# Patient Record
Sex: Male | Born: 1940 | Race: White | Hispanic: No | Marital: Married | Smoking: Never smoker
Health system: Southern US, Community
[De-identification: ages and names within clinical notes are randomized; demographics above are authoritative.]

## PROBLEM LIST (undated history)

## (undated) DIAGNOSIS — K5730 Diverticulosis of large intestine without perforation or abscess without bleeding: Secondary | ICD-10-CM

## (undated) DIAGNOSIS — I2699 Other pulmonary embolism without acute cor pulmonale: Secondary | ICD-10-CM

## (undated) DIAGNOSIS — R51 Headache: Secondary | ICD-10-CM

## (undated) DIAGNOSIS — M797 Fibromyalgia: Secondary | ICD-10-CM

## (undated) DIAGNOSIS — R519 Headache, unspecified: Secondary | ICD-10-CM

## (undated) DIAGNOSIS — I739 Peripheral vascular disease, unspecified: Secondary | ICD-10-CM

## (undated) DIAGNOSIS — Z860100 Personal history of colon polyps, unspecified: Secondary | ICD-10-CM

## (undated) DIAGNOSIS — K529 Noninfective gastroenteritis and colitis, unspecified: Secondary | ICD-10-CM

## (undated) DIAGNOSIS — E669 Obesity, unspecified: Secondary | ICD-10-CM

## (undated) DIAGNOSIS — J84112 Idiopathic pulmonary fibrosis: Secondary | ICD-10-CM

## (undated) DIAGNOSIS — J45909 Unspecified asthma, uncomplicated: Secondary | ICD-10-CM

## (undated) DIAGNOSIS — Z8639 Personal history of other endocrine, nutritional and metabolic disease: Secondary | ICD-10-CM

## (undated) DIAGNOSIS — I10 Essential (primary) hypertension: Secondary | ICD-10-CM

## (undated) DIAGNOSIS — C4490 Unspecified malignant neoplasm of skin, unspecified: Secondary | ICD-10-CM

## (undated) DIAGNOSIS — G2581 Restless legs syndrome: Secondary | ICD-10-CM

## (undated) DIAGNOSIS — C61 Malignant neoplasm of prostate: Secondary | ICD-10-CM

## (undated) DIAGNOSIS — Z87442 Personal history of urinary calculi: Secondary | ICD-10-CM

## (undated) DIAGNOSIS — I482 Chronic atrial fibrillation, unspecified: Secondary | ICD-10-CM

## (undated) DIAGNOSIS — H9190 Unspecified hearing loss, unspecified ear: Secondary | ICD-10-CM

## (undated) DIAGNOSIS — M1990 Unspecified osteoarthritis, unspecified site: Secondary | ICD-10-CM

## (undated) DIAGNOSIS — Z8739 Personal history of other diseases of the musculoskeletal system and connective tissue: Secondary | ICD-10-CM

## (undated) DIAGNOSIS — K219 Gastro-esophageal reflux disease without esophagitis: Secondary | ICD-10-CM

## (undated) DIAGNOSIS — Z86718 Personal history of other venous thrombosis and embolism: Secondary | ICD-10-CM

## (undated) DIAGNOSIS — N529 Male erectile dysfunction, unspecified: Secondary | ICD-10-CM

## (undated) DIAGNOSIS — J189 Pneumonia, unspecified organism: Secondary | ICD-10-CM

## (undated) HISTORY — PX: OTHER SURGICAL HISTORY: SHX169

## (undated) HISTORY — DX: Essential (primary) hypertension: I10

## (undated) HISTORY — DX: Other pulmonary embolism without acute cor pulmonale: I26.99

## (undated) HISTORY — DX: Restless legs syndrome: G25.81

## (undated) HISTORY — PX: FOOT SURGERY: SHX648

## (undated) HISTORY — PX: REPLACEMENT TOTAL KNEE: SUR1224

## (undated) HISTORY — PX: HAND SURGERY: SHX662

## (undated) HISTORY — PX: EYE SURGERY: SHX253

## (undated) HISTORY — DX: Unspecified asthma, uncomplicated: J45.909

## (undated) HISTORY — PX: CYSTOSCOPY: SUR368

## (undated) HISTORY — PX: KNEE SURGERY: SHX244

## (undated) HISTORY — PX: BACK SURGERY: SHX140

## (undated) HISTORY — PX: JOINT REPLACEMENT: SHX530

## (undated) HISTORY — PX: TONSILLECTOMY: SUR1361

## (undated) HISTORY — PX: COLONOSCOPY: SHX174

## (undated) HISTORY — PX: LEG SURGERY: SHX1003

## (undated) HISTORY — DX: Chronic atrial fibrillation, unspecified: I48.20

## (undated) HISTORY — PX: SHOULDER ARTHROSCOPY: SHX128

## (undated) HISTORY — PX: CARDIOVERSION: SHX1299

## (undated) HISTORY — PX: CATARACT EXTRACTION, BILATERAL: SHX1313

## (undated) HISTORY — PX: PICC INSERTION W/OUT PORT/PUMP: CATH118307

## (undated) HISTORY — PX: ARTERY REPAIR: SHX559

## (undated) HISTORY — DX: Diverticulosis of large intestine without perforation or abscess without bleeding: K57.30

---

## 1992-02-10 DIAGNOSIS — E119 Type 2 diabetes mellitus without complications: Secondary | ICD-10-CM | POA: Insufficient documentation

## 1997-12-10 ENCOUNTER — Other Ambulatory Visit: Admission: RE | Admit: 1997-12-10 | Discharge: 1997-12-10 | Payer: Self-pay | Admitting: Gastroenterology

## 1998-01-01 ENCOUNTER — Encounter: Payer: Self-pay | Admitting: Cardiology

## 1998-01-01 ENCOUNTER — Inpatient Hospital Stay (HOSPITAL_COMMUNITY): Admission: AD | Admit: 1998-01-01 | Discharge: 1998-01-01 | Payer: Self-pay | Admitting: Cardiology

## 1998-01-09 ENCOUNTER — Encounter: Payer: Self-pay | Admitting: Emergency Medicine

## 1998-01-09 ENCOUNTER — Emergency Department (HOSPITAL_COMMUNITY): Admission: EM | Admit: 1998-01-09 | Discharge: 1998-01-09 | Payer: Self-pay | Admitting: Emergency Medicine

## 1998-01-28 ENCOUNTER — Observation Stay (HOSPITAL_COMMUNITY): Admission: RE | Admit: 1998-01-28 | Discharge: 1998-01-29 | Payer: Self-pay | Admitting: Orthopedic Surgery

## 1998-01-28 ENCOUNTER — Encounter: Payer: Self-pay | Admitting: Orthopedic Surgery

## 1998-03-03 ENCOUNTER — Encounter: Payer: Self-pay | Admitting: *Deleted

## 1998-03-04 ENCOUNTER — Observation Stay (HOSPITAL_COMMUNITY): Admission: EM | Admit: 1998-03-04 | Discharge: 1998-03-04 | Payer: Self-pay | Admitting: *Deleted

## 1998-03-04 ENCOUNTER — Encounter: Payer: Self-pay | Admitting: *Deleted

## 1999-04-09 ENCOUNTER — Encounter: Payer: Self-pay | Admitting: Emergency Medicine

## 1999-04-10 ENCOUNTER — Encounter: Payer: Self-pay | Admitting: Orthopedic Surgery

## 1999-04-10 ENCOUNTER — Observation Stay (HOSPITAL_COMMUNITY): Admission: EM | Admit: 1999-04-10 | Discharge: 1999-04-10 | Payer: Self-pay | Admitting: Emergency Medicine

## 1999-04-29 ENCOUNTER — Encounter: Admission: RE | Admit: 1999-04-29 | Discharge: 1999-04-29 | Payer: Self-pay | Admitting: Orthopedic Surgery

## 1999-04-29 ENCOUNTER — Encounter: Payer: Self-pay | Admitting: Orthopedic Surgery

## 1999-10-16 ENCOUNTER — Ambulatory Visit: Admission: RE | Admit: 1999-10-16 | Discharge: 1999-10-16 | Payer: Self-pay | Admitting: Family Medicine

## 2000-06-02 ENCOUNTER — Encounter: Payer: Self-pay | Admitting: Orthopedic Surgery

## 2000-06-09 ENCOUNTER — Encounter: Payer: Self-pay | Admitting: Orthopedic Surgery

## 2000-06-09 ENCOUNTER — Inpatient Hospital Stay (HOSPITAL_COMMUNITY): Admission: RE | Admit: 2000-06-09 | Discharge: 2000-06-13 | Payer: Self-pay | Admitting: Orthopedic Surgery

## 2001-12-27 ENCOUNTER — Ambulatory Visit (HOSPITAL_COMMUNITY): Admission: RE | Admit: 2001-12-27 | Discharge: 2001-12-27 | Payer: Self-pay | Admitting: Gastroenterology

## 2001-12-27 ENCOUNTER — Encounter: Payer: Self-pay | Admitting: Gastroenterology

## 2004-08-19 ENCOUNTER — Ambulatory Visit: Payer: Self-pay | Admitting: Cardiology

## 2004-08-25 ENCOUNTER — Encounter: Admission: RE | Admit: 2004-08-25 | Discharge: 2004-08-25 | Payer: Self-pay | Admitting: Family Medicine

## 2004-09-23 ENCOUNTER — Encounter: Admission: RE | Admit: 2004-09-23 | Discharge: 2004-09-23 | Payer: Self-pay | Admitting: Family Medicine

## 2004-10-03 ENCOUNTER — Encounter: Admission: RE | Admit: 2004-10-03 | Discharge: 2004-10-03 | Payer: Self-pay | Admitting: Family Medicine

## 2004-10-08 ENCOUNTER — Ambulatory Visit: Payer: Self-pay | Admitting: Critical Care Medicine

## 2004-10-30 ENCOUNTER — Ambulatory Visit: Payer: Self-pay | Admitting: Critical Care Medicine

## 2004-11-19 ENCOUNTER — Ambulatory Visit: Payer: Self-pay | Admitting: Critical Care Medicine

## 2004-12-25 ENCOUNTER — Encounter: Admission: RE | Admit: 2004-12-25 | Discharge: 2004-12-25 | Payer: Self-pay | Admitting: Neurosurgery

## 2005-01-16 ENCOUNTER — Ambulatory Visit: Payer: Self-pay | Admitting: Cardiology

## 2005-01-21 ENCOUNTER — Inpatient Hospital Stay (HOSPITAL_COMMUNITY): Admission: RE | Admit: 2005-01-21 | Discharge: 2005-01-24 | Payer: Self-pay | Admitting: Neurosurgery

## 2005-02-09 LAB — HM COLONOSCOPY

## 2005-02-19 ENCOUNTER — Ambulatory Visit: Payer: Self-pay | Admitting: Critical Care Medicine

## 2005-04-30 ENCOUNTER — Ambulatory Visit: Payer: Self-pay | Admitting: Cardiology

## 2005-05-28 ENCOUNTER — Ambulatory Visit: Payer: Self-pay | Admitting: Internal Medicine

## 2005-06-25 ENCOUNTER — Ambulatory Visit: Payer: Self-pay | Admitting: Cardiology

## 2005-07-17 ENCOUNTER — Ambulatory Visit: Payer: Self-pay | Admitting: Cardiology

## 2005-08-21 ENCOUNTER — Ambulatory Visit: Payer: Self-pay | Admitting: Cardiology

## 2005-08-31 ENCOUNTER — Ambulatory Visit: Payer: Self-pay | Admitting: Internal Medicine

## 2005-09-14 ENCOUNTER — Ambulatory Visit: Payer: Self-pay | Admitting: Cardiology

## 2005-09-16 ENCOUNTER — Emergency Department (HOSPITAL_COMMUNITY): Admission: EM | Admit: 2005-09-16 | Discharge: 2005-09-16 | Payer: Self-pay | Admitting: Emergency Medicine

## 2005-09-23 ENCOUNTER — Encounter: Admission: RE | Admit: 2005-09-23 | Discharge: 2005-09-23 | Payer: Self-pay | Admitting: Neurosurgery

## 2005-09-23 ENCOUNTER — Ambulatory Visit: Payer: Self-pay | Admitting: Cardiology

## 2005-09-30 ENCOUNTER — Ambulatory Visit: Payer: Self-pay | Admitting: Cardiology

## 2005-10-27 ENCOUNTER — Ambulatory Visit: Payer: Self-pay | Admitting: Gastroenterology

## 2005-10-28 ENCOUNTER — Ambulatory Visit: Payer: Self-pay | Admitting: Cardiology

## 2005-10-30 ENCOUNTER — Ambulatory Visit: Payer: Self-pay | Admitting: Cardiovascular Disease

## 2005-11-04 ENCOUNTER — Ambulatory Visit: Payer: Self-pay | Admitting: Gastroenterology

## 2005-11-11 ENCOUNTER — Ambulatory Visit: Payer: Self-pay | Admitting: Gastroenterology

## 2005-11-16 ENCOUNTER — Ambulatory Visit: Payer: Self-pay | Admitting: Internal Medicine

## 2005-11-18 ENCOUNTER — Ambulatory Visit: Payer: Self-pay | Admitting: Gastroenterology

## 2005-11-25 ENCOUNTER — Ambulatory Visit: Payer: Self-pay | Admitting: Cardiology

## 2005-11-25 ENCOUNTER — Ambulatory Visit: Payer: Self-pay | Admitting: Gastroenterology

## 2005-12-02 ENCOUNTER — Ambulatory Visit: Payer: Self-pay | Admitting: Gastroenterology

## 2005-12-11 ENCOUNTER — Ambulatory Visit: Payer: Self-pay | Admitting: Internal Medicine

## 2006-01-01 ENCOUNTER — Ambulatory Visit: Payer: Self-pay | Admitting: Gastroenterology

## 2006-01-15 ENCOUNTER — Ambulatory Visit: Payer: Self-pay | Admitting: Cardiology

## 2006-01-28 ENCOUNTER — Ambulatory Visit: Payer: Self-pay | Admitting: Cardiology

## 2006-03-09 ENCOUNTER — Ambulatory Visit: Payer: Self-pay | Admitting: Cardiology

## 2006-03-17 ENCOUNTER — Ambulatory Visit: Payer: Self-pay | Admitting: Family Medicine

## 2006-04-09 ENCOUNTER — Ambulatory Visit: Payer: Self-pay | Admitting: Internal Medicine

## 2006-04-22 ENCOUNTER — Encounter
Admission: RE | Admit: 2006-04-22 | Discharge: 2006-07-21 | Payer: Self-pay | Admitting: Physical Medicine & Rehabilitation

## 2006-04-23 ENCOUNTER — Ambulatory Visit: Payer: Self-pay | Admitting: Physical Medicine & Rehabilitation

## 2006-05-11 ENCOUNTER — Ambulatory Visit: Payer: Self-pay | Admitting: Internal Medicine

## 2006-05-19 ENCOUNTER — Encounter: Payer: Self-pay | Admitting: Family Medicine

## 2006-05-19 DIAGNOSIS — K589 Irritable bowel syndrome without diarrhea: Secondary | ICD-10-CM

## 2006-05-19 DIAGNOSIS — I10 Essential (primary) hypertension: Secondary | ICD-10-CM | POA: Insufficient documentation

## 2006-05-19 DIAGNOSIS — K5730 Diverticulosis of large intestine without perforation or abscess without bleeding: Secondary | ICD-10-CM | POA: Insufficient documentation

## 2006-05-19 DIAGNOSIS — M545 Low back pain, unspecified: Secondary | ICD-10-CM | POA: Insufficient documentation

## 2006-05-19 DIAGNOSIS — F411 Generalized anxiety disorder: Secondary | ICD-10-CM | POA: Insufficient documentation

## 2006-05-19 DIAGNOSIS — E7800 Pure hypercholesterolemia, unspecified: Secondary | ICD-10-CM | POA: Insufficient documentation

## 2006-05-19 DIAGNOSIS — F329 Major depressive disorder, single episode, unspecified: Secondary | ICD-10-CM | POA: Insufficient documentation

## 2006-05-19 DIAGNOSIS — K3184 Gastroparesis: Secondary | ICD-10-CM

## 2006-05-19 DIAGNOSIS — F330 Major depressive disorder, recurrent, mild: Secondary | ICD-10-CM | POA: Insufficient documentation

## 2006-05-19 DIAGNOSIS — G2581 Restless legs syndrome: Secondary | ICD-10-CM

## 2006-06-29 ENCOUNTER — Ambulatory Visit: Payer: Self-pay | Admitting: Family Medicine

## 2006-07-01 ENCOUNTER — Ambulatory Visit: Payer: Self-pay | Admitting: Family Medicine

## 2006-07-01 DIAGNOSIS — G933 Postviral fatigue syndrome: Secondary | ICD-10-CM | POA: Insufficient documentation

## 2006-07-01 DIAGNOSIS — B0229 Other postherpetic nervous system involvement: Secondary | ICD-10-CM

## 2006-07-01 LAB — CONVERTED CEMR LAB
HDL goal, serum: 40 mg/dL
INR: 2.2

## 2006-07-29 ENCOUNTER — Ambulatory Visit: Payer: Self-pay | Admitting: Family Medicine

## 2006-07-29 LAB — CONVERTED CEMR LAB
INR: 1.9
Prothrombin Time: 17.2 s

## 2006-08-26 ENCOUNTER — Ambulatory Visit: Payer: Self-pay | Admitting: Cardiology

## 2006-08-26 ENCOUNTER — Ambulatory Visit: Payer: Self-pay | Admitting: Family Medicine

## 2006-08-26 LAB — CONVERTED CEMR LAB: Prothrombin Time: 18 s

## 2006-09-10 ENCOUNTER — Ambulatory Visit: Payer: Self-pay | Admitting: Family Medicine

## 2006-09-20 ENCOUNTER — Telehealth: Payer: Self-pay | Admitting: Family Medicine

## 2006-09-23 ENCOUNTER — Ambulatory Visit: Payer: Self-pay | Admitting: Family Medicine

## 2006-09-23 LAB — CONVERTED CEMR LAB: Prothrombin Time: 19 s

## 2006-09-24 LAB — CONVERTED CEMR LAB
AST: 12 units/L (ref 0–37)
Albumin: 3.7 g/dL (ref 3.5–5.2)
Bilirubin, Direct: 0.1 mg/dL (ref 0.0–0.3)
Creatinine, Ser: 1.2 mg/dL (ref 0.4–1.5)
HDL: 44.5 mg/dL (ref >39.0)
Potassium: 3.4 meq/L — ABNORMAL LOW (ref 3.5–5.1)
Sodium: 142 meq/L (ref 135–145)
Triglycerides: 233 mg/dL (ref 0–149)
VLDL: 47 mg/dL — ABNORMAL HIGH (ref 0–40)

## 2006-09-28 ENCOUNTER — Telehealth: Payer: Self-pay | Admitting: Family Medicine

## 2006-10-04 ENCOUNTER — Ambulatory Visit: Payer: Self-pay | Admitting: Family Medicine

## 2006-10-04 LAB — CONVERTED CEMR LAB
BUN: 28 mg/dL — ABNORMAL HIGH (ref 6–23)
CO2: 26 meq/L (ref 19–32)
Calcium: 9.5 mg/dL (ref 8.4–10.5)
Chloride: 105 meq/L (ref 96–112)
GFR calc Af Amer: 66 mL/min
Glucose, Bld: 182 mg/dL — ABNORMAL HIGH (ref 70–99)

## 2006-11-01 ENCOUNTER — Ambulatory Visit: Payer: Self-pay | Admitting: Family Medicine

## 2006-11-01 LAB — CONVERTED CEMR LAB: Prothrombin Time: 15.7 s

## 2006-11-09 ENCOUNTER — Ambulatory Visit: Payer: Self-pay | Admitting: Family Medicine

## 2006-11-15 ENCOUNTER — Ambulatory Visit: Payer: Self-pay | Admitting: Family Medicine

## 2006-11-15 LAB — CONVERTED CEMR LAB: INR: 2.4

## 2006-12-18 ENCOUNTER — Encounter: Payer: Self-pay | Admitting: *Deleted

## 2006-12-18 DIAGNOSIS — Z96659 Presence of unspecified artificial knee joint: Secondary | ICD-10-CM | POA: Insufficient documentation

## 2006-12-22 ENCOUNTER — Telehealth: Payer: Self-pay | Admitting: Family Medicine

## 2006-12-27 ENCOUNTER — Ambulatory Visit: Payer: Self-pay | Admitting: Family Medicine

## 2006-12-27 LAB — CONVERTED CEMR LAB

## 2006-12-29 ENCOUNTER — Telehealth: Payer: Self-pay | Admitting: Family Medicine

## 2007-01-05 ENCOUNTER — Encounter (INDEPENDENT_AMBULATORY_CARE_PROVIDER_SITE_OTHER): Payer: Self-pay | Admitting: *Deleted

## 2007-01-10 ENCOUNTER — Ambulatory Visit: Payer: Self-pay | Admitting: Family Medicine

## 2007-01-10 ENCOUNTER — Encounter: Payer: Self-pay | Admitting: Internal Medicine

## 2007-01-17 LAB — CONVERTED CEMR LAB
BUN: 20 mg/dL (ref 6–23)
Chloride: 107 meq/L (ref 96–112)
Creatinine, Ser: 1.1 mg/dL (ref 0.4–1.5)
GFR calc Af Amer: 86 mL/min
GFR calc non Af Amer: 71 mL/min
Hgb A1c MFr Bld: 7.4 % — ABNORMAL HIGH (ref 4.6–6.0)
Potassium: 4.7 meq/L (ref 3.5–5.1)

## 2007-01-20 ENCOUNTER — Telehealth: Payer: Self-pay | Admitting: Family Medicine

## 2007-01-21 ENCOUNTER — Ambulatory Visit: Payer: Self-pay | Admitting: Family Medicine

## 2007-01-28 ENCOUNTER — Ambulatory Visit: Payer: Self-pay | Admitting: Gastroenterology

## 2007-02-07 ENCOUNTER — Encounter: Payer: Self-pay | Admitting: Family Medicine

## 2007-02-08 ENCOUNTER — Ambulatory Visit: Payer: Self-pay | Admitting: Family Medicine

## 2007-02-21 ENCOUNTER — Telehealth: Payer: Self-pay | Admitting: Family Medicine

## 2007-02-28 ENCOUNTER — Telehealth: Payer: Self-pay | Admitting: Family Medicine

## 2007-03-24 ENCOUNTER — Ambulatory Visit: Payer: Self-pay | Admitting: Family Medicine

## 2007-03-24 LAB — CONVERTED CEMR LAB
INR: 2.4
Prothrombin Time: 18.8 s

## 2007-04-27 ENCOUNTER — Ambulatory Visit: Payer: Self-pay | Admitting: Family Medicine

## 2007-04-27 LAB — CONVERTED CEMR LAB
INR: 2.1
Prothrombin Time: 17.8 s

## 2007-05-17 ENCOUNTER — Telehealth: Payer: Self-pay | Admitting: Family Medicine

## 2007-06-22 ENCOUNTER — Ambulatory Visit: Payer: Self-pay | Admitting: Family Medicine

## 2007-06-22 LAB — CONVERTED CEMR LAB

## 2007-06-27 ENCOUNTER — Ambulatory Visit: Payer: Self-pay | Admitting: Family Medicine

## 2007-06-27 LAB — CONVERTED CEMR LAB
Glucose, Urine, Semiquant: NEGATIVE
Nitrite: NEGATIVE
Specific Gravity, Urine: 1.015
Urobilinogen, UA: 0.2
WBC Urine, dipstick: NEGATIVE
pH: 5

## 2007-07-14 ENCOUNTER — Ambulatory Visit: Payer: Self-pay | Admitting: Family Medicine

## 2007-07-14 LAB — CONVERTED CEMR LAB: Prothrombin Time: 20.3 s

## 2007-07-19 ENCOUNTER — Telehealth: Payer: Self-pay | Admitting: Family Medicine

## 2007-07-25 ENCOUNTER — Encounter (INDEPENDENT_AMBULATORY_CARE_PROVIDER_SITE_OTHER): Payer: Self-pay | Admitting: *Deleted

## 2007-07-28 ENCOUNTER — Ambulatory Visit: Payer: Self-pay | Admitting: Family Medicine

## 2007-08-10 LAB — CONVERTED CEMR LAB
AST: 14 units/L (ref 0–37)
Albumin: 4.1 g/dL (ref 3.5–5.2)
Alkaline Phosphatase: 38 units/L — ABNORMAL LOW (ref 39–117)
BUN: 26 mg/dL — ABNORMAL HIGH (ref 6–23)
CO2: 24 meq/L (ref 19–32)
Chloride: 106 meq/L (ref 96–112)
GFR calc non Af Amer: 65 mL/min
Potassium: 4.5 meq/L (ref 3.5–5.1)
Total Bilirubin: 0.8 mg/dL (ref 0.3–1.2)
VLDL: 45 mg/dL — ABNORMAL HIGH (ref 0–40)

## 2007-08-26 ENCOUNTER — Encounter: Payer: Self-pay | Admitting: Family Medicine

## 2007-09-08 ENCOUNTER — Ambulatory Visit: Payer: Self-pay | Admitting: Cardiology

## 2007-09-09 ENCOUNTER — Encounter: Payer: Self-pay | Admitting: Family Medicine

## 2007-09-13 ENCOUNTER — Ambulatory Visit: Payer: Self-pay | Admitting: Family Medicine

## 2007-09-13 LAB — CONVERTED CEMR LAB: INR: 2.5

## 2007-10-07 ENCOUNTER — Encounter: Payer: Self-pay | Admitting: Family Medicine

## 2007-10-11 ENCOUNTER — Ambulatory Visit: Payer: Self-pay | Admitting: Family Medicine

## 2007-10-11 LAB — CONVERTED CEMR LAB: Prothrombin Time: 17.7 s

## 2007-10-19 ENCOUNTER — Ambulatory Visit: Payer: Self-pay | Admitting: Family Medicine

## 2007-10-20 ENCOUNTER — Ambulatory Visit: Payer: Self-pay | Admitting: Cardiology

## 2007-10-20 ENCOUNTER — Ambulatory Visit: Payer: Self-pay | Admitting: Family Medicine

## 2007-10-21 ENCOUNTER — Encounter: Payer: Self-pay | Admitting: Family Medicine

## 2007-10-24 ENCOUNTER — Ambulatory Visit: Payer: Self-pay | Admitting: Family Medicine

## 2007-10-24 DIAGNOSIS — N1330 Unspecified hydronephrosis: Secondary | ICD-10-CM

## 2007-10-24 LAB — CONVERTED CEMR LAB: Prothrombin Time: 25.6 s

## 2007-10-25 LAB — CONVERTED CEMR LAB
BUN: 29 mg/dL — ABNORMAL HIGH (ref 6–23)
Creatinine, Ser: 1.3 mg/dL (ref 0.4–1.5)
GFR calc Af Amer: 71 mL/min
GFR calc non Af Amer: 59 mL/min

## 2007-11-07 ENCOUNTER — Ambulatory Visit: Payer: Self-pay | Admitting: Family Medicine

## 2007-11-07 ENCOUNTER — Telehealth: Payer: Self-pay | Admitting: Family Medicine

## 2007-11-14 ENCOUNTER — Telehealth: Payer: Self-pay | Admitting: Family Medicine

## 2007-11-14 ENCOUNTER — Encounter: Admission: RE | Admit: 2007-11-14 | Discharge: 2007-11-14 | Payer: Self-pay | Admitting: Family Medicine

## 2007-11-16 ENCOUNTER — Ambulatory Visit: Payer: Self-pay | Admitting: Family Medicine

## 2007-11-21 ENCOUNTER — Ambulatory Visit: Payer: Self-pay | Admitting: Family Medicine

## 2007-11-21 LAB — CONVERTED CEMR LAB
INR: 2.4
Prothrombin Time: 18.8 s

## 2007-11-28 ENCOUNTER — Encounter: Payer: Self-pay | Admitting: Family Medicine

## 2007-12-19 ENCOUNTER — Ambulatory Visit: Payer: Self-pay | Admitting: Family Medicine

## 2007-12-19 LAB — CONVERTED CEMR LAB
INR: 2.1
Prothrombin Time: 17.8 s

## 2007-12-21 ENCOUNTER — Ambulatory Visit: Payer: Self-pay | Admitting: Family Medicine

## 2007-12-23 LAB — CONVERTED CEMR LAB
ALT: 19 units/L (ref 0–53)
AST: 16 units/L (ref 0–37)
Direct LDL: 84.9 mg/dL
HDL: 32.3 mg/dL — ABNORMAL LOW (ref >39.0)

## 2007-12-26 ENCOUNTER — Ambulatory Visit: Payer: Self-pay | Admitting: Family Medicine

## 2008-01-16 ENCOUNTER — Ambulatory Visit: Payer: Self-pay | Admitting: Family Medicine

## 2008-01-16 LAB — CONVERTED CEMR LAB: INR: 2

## 2008-01-24 ENCOUNTER — Encounter: Payer: Self-pay | Admitting: Family Medicine

## 2008-01-30 ENCOUNTER — Ambulatory Visit: Payer: Self-pay | Admitting: Family Medicine

## 2008-01-30 ENCOUNTER — Encounter (INDEPENDENT_AMBULATORY_CARE_PROVIDER_SITE_OTHER): Payer: Self-pay | Admitting: *Deleted

## 2008-01-30 LAB — CONVERTED CEMR LAB: Prothrombin Time: 18 s

## 2008-02-14 ENCOUNTER — Telehealth: Payer: Self-pay | Admitting: Family Medicine

## 2008-02-21 ENCOUNTER — Telehealth: Payer: Self-pay | Admitting: Family Medicine

## 2008-02-22 ENCOUNTER — Ambulatory Visit: Payer: Self-pay | Admitting: Family Medicine

## 2008-02-24 ENCOUNTER — Telehealth: Payer: Self-pay | Admitting: Family Medicine

## 2008-02-27 ENCOUNTER — Telehealth: Payer: Self-pay | Admitting: Family Medicine

## 2008-02-27 ENCOUNTER — Ambulatory Visit: Payer: Self-pay | Admitting: Family Medicine

## 2008-02-27 LAB — CONVERTED CEMR LAB: INR: 2.4

## 2008-03-05 ENCOUNTER — Telehealth: Payer: Self-pay | Admitting: Family Medicine

## 2008-03-14 ENCOUNTER — Telehealth: Payer: Self-pay | Admitting: Family Medicine

## 2008-03-28 ENCOUNTER — Ambulatory Visit: Payer: Self-pay | Admitting: Family Medicine

## 2008-03-28 LAB — CONVERTED CEMR LAB: Prothrombin Time: 19.3 s

## 2008-03-29 LAB — CONVERTED CEMR LAB
ALT: 21 units/L (ref 0–53)
CO2: 24 meq/L (ref 19–32)
Calcium: 8.9 mg/dL (ref 8.4–10.5)
Cholesterol: 168 mg/dL (ref 0–200)
Creatinine, Ser: 1 mg/dL (ref 0.4–1.5)
Direct LDL: 92.5 mg/dL
GFR calc Af Amer: 96 mL/min
Glucose, Bld: 122 mg/dL — ABNORMAL HIGH (ref 70–99)
HDL: 40.1 mg/dL (ref >39.0)
Hgb A1c MFr Bld: 7.4 % — ABNORMAL HIGH (ref 4.6–6.0)
Sodium: 139 meq/L (ref 135–145)
Total CHOL/HDL Ratio: 4.2
Total Protein: 6.7 g/dL (ref 6.0–8.3)
Triglycerides: 234 mg/dL (ref 0–149)

## 2008-04-02 ENCOUNTER — Ambulatory Visit: Payer: Self-pay | Admitting: Family Medicine

## 2008-04-02 ENCOUNTER — Telehealth: Payer: Self-pay | Admitting: Family Medicine

## 2008-05-01 ENCOUNTER — Ambulatory Visit: Payer: Self-pay | Admitting: Family Medicine

## 2008-05-01 LAB — CONVERTED CEMR LAB: Prothrombin Time: 19.3 s

## 2008-05-23 ENCOUNTER — Telehealth: Payer: Self-pay | Admitting: Cardiology

## 2008-05-24 ENCOUNTER — Encounter: Payer: Self-pay | Admitting: Cardiology

## 2008-05-24 ENCOUNTER — Ambulatory Visit: Payer: Self-pay | Admitting: Cardiology

## 2008-05-29 ENCOUNTER — Ambulatory Visit: Payer: Self-pay | Admitting: Family Medicine

## 2008-05-29 LAB — CONVERTED CEMR LAB: INR: 3.1

## 2008-06-01 ENCOUNTER — Encounter: Payer: Self-pay | Admitting: Cardiology

## 2008-06-01 ENCOUNTER — Ambulatory Visit: Payer: Self-pay

## 2008-06-14 ENCOUNTER — Telehealth: Payer: Self-pay | Admitting: Cardiology

## 2008-06-22 ENCOUNTER — Telehealth: Payer: Self-pay | Admitting: Family Medicine

## 2008-06-22 ENCOUNTER — Encounter (INDEPENDENT_AMBULATORY_CARE_PROVIDER_SITE_OTHER): Payer: Self-pay | Admitting: *Deleted

## 2008-07-02 ENCOUNTER — Encounter: Payer: Self-pay | Admitting: Family Medicine

## 2008-07-03 ENCOUNTER — Ambulatory Visit: Payer: Self-pay | Admitting: Family Medicine

## 2008-07-03 LAB — CONVERTED CEMR LAB
AST: 23 units/L (ref 0–37)
Albumin: 4.3 g/dL (ref 3.5–5.2)
Alkaline Phosphatase: 37 units/L — ABNORMAL LOW (ref 39–117)
Bilirubin, Direct: 0.1 mg/dL (ref 0.0–0.3)
Calcium: 9.5 mg/dL (ref 8.4–10.5)
Creatinine,U: 244.6 mg/dL
GFR calc non Af Amer: 64.24 mL/min (ref >60)
Glucose, Bld: 231 mg/dL — ABNORMAL HIGH (ref 70–99)
HDL: 47.5 mg/dL (ref >39.00)
INR: 2.3
Microalb, Ur: 9.9 mg/dL — ABNORMAL HIGH (ref 0.0–1.9)
Potassium: 4.5 meq/L (ref 3.5–5.1)
Prothrombin Time: 18.7 s
Sodium: 140 meq/L (ref 135–145)
Total CHOL/HDL Ratio: 4
Triglycerides: 206 mg/dL — ABNORMAL HIGH (ref 0.0–149.0)

## 2008-07-03 LAB — HM SIGMOIDOSCOPY

## 2008-07-04 ENCOUNTER — Telehealth: Payer: Self-pay | Admitting: Family Medicine

## 2008-07-04 ENCOUNTER — Encounter: Payer: Self-pay | Admitting: Family Medicine

## 2008-08-17 ENCOUNTER — Telehealth: Payer: Self-pay | Admitting: Family Medicine

## 2008-08-20 ENCOUNTER — Ambulatory Visit: Payer: Self-pay | Admitting: Family Medicine

## 2008-08-20 LAB — CONVERTED CEMR LAB
INR: 2.3
Prothrombin Time: 18.4 s

## 2008-08-23 ENCOUNTER — Encounter: Admission: RE | Admit: 2008-08-23 | Discharge: 2008-08-23 | Payer: Self-pay | Admitting: Internal Medicine

## 2008-08-24 ENCOUNTER — Emergency Department (HOSPITAL_COMMUNITY): Admission: EM | Admit: 2008-08-24 | Discharge: 2008-08-24 | Payer: Self-pay | Admitting: Emergency Medicine

## 2008-09-17 ENCOUNTER — Ambulatory Visit: Payer: Self-pay | Admitting: Family Medicine

## 2008-09-17 LAB — CONVERTED CEMR LAB
INR: 2.8
Prothrombin Time: 20.1 s

## 2008-09-22 ENCOUNTER — Emergency Department (HOSPITAL_COMMUNITY): Admission: EM | Admit: 2008-09-22 | Discharge: 2008-09-22 | Payer: Self-pay | Admitting: Emergency Medicine

## 2008-10-01 ENCOUNTER — Ambulatory Visit: Payer: Self-pay | Admitting: Family Medicine

## 2008-10-04 ENCOUNTER — Ambulatory Visit: Payer: Self-pay | Admitting: Family Medicine

## 2008-10-04 DIAGNOSIS — J45901 Unspecified asthma with (acute) exacerbation: Secondary | ICD-10-CM | POA: Insufficient documentation

## 2008-10-22 ENCOUNTER — Ambulatory Visit: Payer: Self-pay | Admitting: Family Medicine

## 2008-10-22 LAB — CONVERTED CEMR LAB
INR: 3.6
Prothrombin Time: 22.9 s

## 2008-10-26 ENCOUNTER — Telehealth: Payer: Self-pay | Admitting: Family Medicine

## 2008-11-13 ENCOUNTER — Ambulatory Visit: Payer: Self-pay | Admitting: Family Medicine

## 2008-11-13 DIAGNOSIS — J4540 Moderate persistent asthma, uncomplicated: Secondary | ICD-10-CM

## 2008-11-19 ENCOUNTER — Ambulatory Visit: Payer: Self-pay | Admitting: Family Medicine

## 2008-12-17 ENCOUNTER — Ambulatory Visit: Payer: Self-pay | Admitting: Family Medicine

## 2008-12-26 ENCOUNTER — Ambulatory Visit: Payer: Self-pay | Admitting: Family Medicine

## 2008-12-26 LAB — CONVERTED CEMR LAB: INR: 2.3

## 2008-12-28 ENCOUNTER — Ambulatory Visit: Payer: Self-pay | Admitting: Family Medicine

## 2009-01-02 ENCOUNTER — Ambulatory Visit: Payer: Self-pay | Admitting: Family Medicine

## 2009-01-09 ENCOUNTER — Encounter (INDEPENDENT_AMBULATORY_CARE_PROVIDER_SITE_OTHER): Payer: Self-pay | Admitting: Internal Medicine

## 2009-01-09 ENCOUNTER — Ambulatory Visit: Payer: Self-pay | Admitting: Family Medicine

## 2009-01-09 LAB — CONVERTED CEMR LAB
Basophils Relative: 0.4 % (ref 0.0–3.0)
Eosinophils Absolute: 0.1 10*3/uL (ref 0.0–0.7)
Hemoglobin: 14.1 g/dL (ref 13.0–17.0)
INR: 3.5
Lymphocytes Relative: 27.7 % (ref 12.0–46.0)
MCHC: 33.7 g/dL (ref 30.0–36.0)
Monocytes Relative: 7.1 % (ref 3.0–12.0)
Neutro Abs: 6.4 10*3/uL (ref 1.4–7.7)
Neutrophils Relative %: 63.7 % (ref 43.0–77.0)
Prothrombin Time: 22.5 s
RBC: 4.54 M/uL (ref 4.22–5.81)
WBC: 10 10*3/uL (ref 4.5–10.5)

## 2009-01-10 ENCOUNTER — Telehealth (INDEPENDENT_AMBULATORY_CARE_PROVIDER_SITE_OTHER): Payer: Self-pay | Admitting: Internal Medicine

## 2009-01-11 ENCOUNTER — Telehealth (INDEPENDENT_AMBULATORY_CARE_PROVIDER_SITE_OTHER): Payer: Self-pay | Admitting: Internal Medicine

## 2009-01-17 ENCOUNTER — Ambulatory Visit: Payer: Self-pay | Admitting: Family Medicine

## 2009-01-17 DIAGNOSIS — J309 Allergic rhinitis, unspecified: Secondary | ICD-10-CM | POA: Insufficient documentation

## 2009-01-28 ENCOUNTER — Encounter: Payer: Self-pay | Admitting: Family Medicine

## 2009-01-29 ENCOUNTER — Telehealth: Payer: Self-pay | Admitting: Family Medicine

## 2009-02-06 ENCOUNTER — Ambulatory Visit: Payer: Self-pay | Admitting: Family Medicine

## 2009-02-06 LAB — CONVERTED CEMR LAB: INR: 2.8

## 2009-02-15 ENCOUNTER — Ambulatory Visit: Payer: Self-pay | Admitting: Family Medicine

## 2009-02-18 LAB — CONVERTED CEMR LAB
AST: 17 units/L (ref 0–37)
Alkaline Phosphatase: 44 units/L (ref 39–117)
Bilirubin, Direct: 0.1 mg/dL (ref 0.0–0.3)
CO2: 24 meq/L (ref 19–32)
Calcium: 9.3 mg/dL (ref 8.4–10.5)
Direct LDL: 107.6 mg/dL
Eosinophils Relative: 1.7 % (ref 0.0–5.0)
GFR calc non Af Amer: 58.47 mL/min (ref >60)
HDL: 38.1 mg/dL — ABNORMAL LOW (ref >39.00)
Hgb A1c MFr Bld: 9.3 % — ABNORMAL HIGH (ref 4.6–6.5)
Monocytes Relative: 11 % (ref 3.0–12.0)
Neutrophils Relative %: 59 % (ref 43.0–77.0)
Platelets: 215 10*3/uL (ref 150.0–400.0)
Potassium: 4.5 meq/L (ref 3.5–5.1)
RBC: 4.37 M/uL (ref 4.22–5.81)
Sodium: 141 meq/L (ref 135–145)
Total Protein: 7.6 g/dL (ref 6.0–8.3)
VLDL: 59 mg/dL — ABNORMAL HIGH (ref 0.0–40.0)
WBC: 5.1 10*3/uL (ref 4.5–10.5)

## 2009-02-22 ENCOUNTER — Encounter: Payer: Self-pay | Admitting: Family Medicine

## 2009-03-04 ENCOUNTER — Telehealth: Payer: Self-pay | Admitting: Cardiology

## 2009-03-05 ENCOUNTER — Ambulatory Visit: Payer: Self-pay | Admitting: Family Medicine

## 2009-03-05 LAB — CONVERTED CEMR LAB
INR: 3
Prothrombin Time: 20.8 s

## 2009-03-31 ENCOUNTER — Emergency Department (HOSPITAL_COMMUNITY): Admission: EM | Admit: 2009-03-31 | Discharge: 2009-04-01 | Payer: Self-pay | Admitting: Emergency Medicine

## 2009-04-09 ENCOUNTER — Telehealth: Payer: Self-pay | Admitting: Family Medicine

## 2009-04-09 ENCOUNTER — Ambulatory Visit: Payer: Self-pay | Admitting: Family Medicine

## 2009-04-09 LAB — CONVERTED CEMR LAB: INR: 1.9

## 2009-04-12 ENCOUNTER — Encounter: Payer: Self-pay | Admitting: Family Medicine

## 2009-04-19 ENCOUNTER — Telehealth: Payer: Self-pay | Admitting: Family Medicine

## 2009-04-19 ENCOUNTER — Encounter: Payer: Self-pay | Admitting: Family Medicine

## 2009-05-08 ENCOUNTER — Encounter: Payer: Self-pay | Admitting: Cardiology

## 2009-05-10 ENCOUNTER — Ambulatory Visit: Payer: Self-pay | Admitting: Cardiology

## 2009-05-17 ENCOUNTER — Telehealth: Payer: Self-pay | Admitting: Family Medicine

## 2009-05-17 ENCOUNTER — Ambulatory Visit: Payer: Self-pay | Admitting: Family Medicine

## 2009-05-21 ENCOUNTER — Ambulatory Visit: Payer: Self-pay | Admitting: Family Medicine

## 2009-06-05 ENCOUNTER — Telehealth: Payer: Self-pay | Admitting: Family Medicine

## 2009-06-05 ENCOUNTER — Encounter: Payer: Self-pay | Admitting: Family Medicine

## 2009-06-11 ENCOUNTER — Ambulatory Visit: Payer: Self-pay | Admitting: Family Medicine

## 2009-06-14 ENCOUNTER — Telehealth: Payer: Self-pay | Admitting: Family Medicine

## 2009-06-17 ENCOUNTER — Ambulatory Visit: Payer: Self-pay | Admitting: Family Medicine

## 2009-06-18 ENCOUNTER — Telehealth: Payer: Self-pay | Admitting: Family Medicine

## 2009-06-24 ENCOUNTER — Ambulatory Visit: Payer: Self-pay | Admitting: Family Medicine

## 2009-06-25 ENCOUNTER — Ambulatory Visit: Payer: Self-pay | Admitting: Family Medicine

## 2009-06-27 ENCOUNTER — Telehealth: Payer: Self-pay | Admitting: Family Medicine

## 2009-06-28 ENCOUNTER — Telehealth: Payer: Self-pay | Admitting: Family Medicine

## 2009-07-04 ENCOUNTER — Encounter: Payer: Self-pay | Admitting: Family Medicine

## 2009-07-09 ENCOUNTER — Ambulatory Visit: Payer: Self-pay | Admitting: Family Medicine

## 2009-07-09 LAB — CONVERTED CEMR LAB
AST: 15 units/L (ref 0–37)
Alkaline Phosphatase: 49 units/L (ref 39–117)
BUN: 17 mg/dL (ref 6–23)
Direct LDL: 96.6 mg/dL
GFR calc non Af Amer: 75.55 mL/min (ref >60)
HDL: 44.3 mg/dL (ref >39.00)
Hgb A1c MFr Bld: 10.5 % — ABNORMAL HIGH (ref 4.6–6.5)
Microalb Creat Ratio: 6.3 mg/g (ref 0.0–30.0)
Potassium: 4.3 meq/L (ref 3.5–5.1)
Sodium: 137 meq/L (ref 135–145)
Total Bilirubin: 0.7 mg/dL (ref 0.3–1.2)
Total CHOL/HDL Ratio: 4
VLDL: 76.8 mg/dL — ABNORMAL HIGH (ref 0.0–40.0)

## 2009-07-10 ENCOUNTER — Encounter: Payer: Self-pay | Admitting: Cardiology

## 2009-07-12 ENCOUNTER — Ambulatory Visit: Payer: Self-pay | Admitting: Family Medicine

## 2009-07-15 ENCOUNTER — Ambulatory Visit: Payer: Self-pay | Admitting: Pulmonary Disease

## 2009-07-15 ENCOUNTER — Encounter: Payer: Self-pay | Admitting: Gastroenterology

## 2009-07-15 ENCOUNTER — Inpatient Hospital Stay (HOSPITAL_COMMUNITY): Admission: EM | Admit: 2009-07-15 | Discharge: 2009-08-14 | Payer: Self-pay | Admitting: Family Medicine

## 2009-07-16 ENCOUNTER — Encounter (INDEPENDENT_AMBULATORY_CARE_PROVIDER_SITE_OTHER): Payer: Self-pay | Admitting: Pulmonary Disease

## 2009-07-16 ENCOUNTER — Ambulatory Visit: Payer: Self-pay | Admitting: Infectious Disease

## 2009-07-25 ENCOUNTER — Encounter: Payer: Self-pay | Admitting: Internal Medicine

## 2009-08-02 ENCOUNTER — Ambulatory Visit: Payer: Self-pay | Admitting: Surgery

## 2009-08-14 ENCOUNTER — Ambulatory Visit: Payer: Self-pay | Admitting: Pulmonary Disease

## 2009-08-14 ENCOUNTER — Inpatient Hospital Stay: Admission: RE | Admit: 2009-08-14 | Discharge: 2009-08-26 | Payer: Self-pay | Admitting: Internal Medicine

## 2009-08-28 ENCOUNTER — Ambulatory Visit: Payer: Self-pay | Admitting: Family Medicine

## 2009-08-28 ENCOUNTER — Telehealth: Payer: Self-pay | Admitting: Family Medicine

## 2009-08-29 ENCOUNTER — Telehealth: Payer: Self-pay | Admitting: Family Medicine

## 2009-08-30 ENCOUNTER — Telehealth: Payer: Self-pay | Admitting: Family Medicine

## 2009-09-02 ENCOUNTER — Encounter: Payer: Self-pay | Admitting: Family Medicine

## 2009-09-02 ENCOUNTER — Ambulatory Visit: Payer: Self-pay | Admitting: Family Medicine

## 2009-09-02 DIAGNOSIS — D638 Anemia in other chronic diseases classified elsewhere: Secondary | ICD-10-CM

## 2009-09-02 DIAGNOSIS — N259 Disorder resulting from impaired renal tubular function, unspecified: Secondary | ICD-10-CM | POA: Insufficient documentation

## 2009-09-02 LAB — CONVERTED CEMR LAB: Prothrombin Time: 47.3 s

## 2009-09-03 ENCOUNTER — Encounter: Payer: Self-pay | Admitting: Family Medicine

## 2009-09-05 ENCOUNTER — Ambulatory Visit: Payer: Self-pay | Admitting: Family Medicine

## 2009-09-05 ENCOUNTER — Ambulatory Visit: Payer: Self-pay | Admitting: Internal Medicine

## 2009-09-05 LAB — CONVERTED CEMR LAB
ALT: 10 units/L (ref 0–53)
AST: 15 units/L (ref 0–37)
BUN: 17 mg/dL (ref 6–23)
Basophils Absolute: 0.2 10*3/uL — ABNORMAL HIGH (ref 0.0–0.1)
Basophils Absolute: 0.1 10*3/uL (ref 0.0–0.1)
Bilirubin, Direct: 0.1 mg/dL (ref 0.0–0.3)
CO2: 18 meq/L — ABNORMAL LOW (ref 19–32)
Calcium: 8.9 mg/dL (ref 8.4–10.5)
Creatinine, Ser: 1.5 mg/dL (ref 0.4–1.5)
Creatinine, Ser: 1.3 mg/dL (ref 0.4–1.5)
Eosinophils Absolute: 0.5 10*3/uL (ref 0.0–0.7)
Eosinophils Relative: 2.8 % (ref 0.0–5.0)
GFR calc non Af Amer: 59.42 mL/min (ref >60)
Glucose, Bld: 123 mg/dL — ABNORMAL HIGH (ref 70–99)
Iron: 29 ug/dL — ABNORMAL LOW (ref 42–165)
Lymphocytes Relative: 27.3 % (ref 12.0–46.0)
MCHC: 33.9 g/dL (ref 30.0–36.0)
Monocytes Absolute: 1 10*3/uL (ref 0.1–1.0)
Monocytes Relative: 9.2 % (ref 3.0–12.0)
Neutrophils Relative %: 57.5 % (ref 43.0–77.0)
Neutrophils Relative %: 62.6 % (ref 43.0–77.0)
Platelets: 494 10*3/uL — ABNORMAL HIGH (ref 150.0–400.0)
RBC: 3.46 M/uL — ABNORMAL LOW (ref 4.22–5.81)
RDW: 17.2 % — ABNORMAL HIGH (ref 11.5–14.6)
Saturation Ratios: 13.4 % — ABNORMAL LOW (ref 20.0–50.0)
Total Bilirubin: 0.6 mg/dL (ref 0.3–1.2)
Transferrin: 154.5 mg/dL — ABNORMAL LOW (ref 212.0–360.0)
WBC: 11.1 10*3/uL — ABNORMAL HIGH (ref 4.5–10.5)

## 2009-09-12 ENCOUNTER — Encounter: Payer: Self-pay | Admitting: Internal Medicine

## 2009-09-12 ENCOUNTER — Telehealth: Payer: Self-pay | Admitting: Family Medicine

## 2009-09-13 ENCOUNTER — Encounter: Admission: RE | Admit: 2009-09-13 | Discharge: 2009-09-13 | Payer: Self-pay | Admitting: Otolaryngology

## 2009-09-17 ENCOUNTER — Telehealth: Payer: Self-pay | Admitting: Family Medicine

## 2009-09-17 ENCOUNTER — Encounter: Payer: Self-pay | Admitting: Internal Medicine

## 2009-09-18 ENCOUNTER — Encounter: Payer: Self-pay | Admitting: Family Medicine

## 2009-09-19 ENCOUNTER — Telehealth: Payer: Self-pay | Admitting: Family Medicine

## 2009-09-20 ENCOUNTER — Ambulatory Visit (HOSPITAL_COMMUNITY): Admission: RE | Admit: 2009-09-20 | Discharge: 2009-09-20 | Payer: Self-pay | Admitting: Otolaryngology

## 2009-09-23 ENCOUNTER — Ambulatory Visit: Payer: Self-pay | Admitting: Family Medicine

## 2009-09-24 ENCOUNTER — Encounter: Payer: Self-pay | Admitting: Family Medicine

## 2009-09-24 ENCOUNTER — Telehealth: Payer: Self-pay | Admitting: Family Medicine

## 2009-09-26 ENCOUNTER — Encounter: Payer: Self-pay | Admitting: Internal Medicine

## 2009-09-26 ENCOUNTER — Encounter: Payer: Self-pay | Admitting: Family Medicine

## 2009-09-30 ENCOUNTER — Encounter: Payer: Self-pay | Admitting: Family Medicine

## 2009-10-07 ENCOUNTER — Ambulatory Visit: Payer: Self-pay | Admitting: Family Medicine

## 2009-10-07 ENCOUNTER — Telehealth: Payer: Self-pay | Admitting: Family Medicine

## 2009-10-10 ENCOUNTER — Encounter (INDEPENDENT_AMBULATORY_CARE_PROVIDER_SITE_OTHER): Payer: Self-pay | Admitting: *Deleted

## 2009-10-10 ENCOUNTER — Telehealth: Payer: Self-pay | Admitting: Family Medicine

## 2009-10-10 ENCOUNTER — Ambulatory Visit (HOSPITAL_COMMUNITY): Admission: RE | Admit: 2009-10-10 | Discharge: 2009-10-10 | Payer: Self-pay | Admitting: Chiropractic Medicine

## 2009-10-11 ENCOUNTER — Telehealth: Payer: Self-pay | Admitting: Family Medicine

## 2009-10-24 ENCOUNTER — Telehealth: Payer: Self-pay | Admitting: Family Medicine

## 2009-10-29 ENCOUNTER — Ambulatory Visit: Payer: Self-pay | Admitting: Family Medicine

## 2009-11-04 ENCOUNTER — Ambulatory Visit: Payer: Self-pay | Admitting: Family Medicine

## 2009-11-04 LAB — CONVERTED CEMR LAB: INR: 1.8

## 2009-11-05 ENCOUNTER — Ambulatory Visit: Payer: Self-pay | Admitting: Family Medicine

## 2009-11-05 LAB — CONVERTED CEMR LAB
ALT: 17 units/L (ref 0–53)
AST: 14 units/L (ref 0–37)
Albumin: 3.7 g/dL (ref 3.5–5.2)
Alkaline Phosphatase: 55 units/L (ref 39–117)
Basophils Absolute: 0 10*3/uL (ref 0.0–0.1)
CO2: 27 meq/L (ref 19–32)
Chloride: 105 meq/L (ref 96–112)
Direct LDL: 163.2 mg/dL
GFR calc non Af Amer: 72.26 mL/min (ref >60)
Glucose, Bld: 154 mg/dL — ABNORMAL HIGH (ref 70–99)
HCT: 35.3 % — ABNORMAL LOW (ref 39.0–52.0)
Ketones, urine, test strip: NEGATIVE
Lymphs Abs: 3.2 10*3/uL (ref 0.7–4.0)
Monocytes Absolute: 0.8 10*3/uL (ref 0.1–1.0)
Monocytes Relative: 7.9 % (ref 3.0–12.0)
Nitrite: NEGATIVE
Platelets: 289 10*3/uL (ref 150.0–400.0)
Potassium: 4 meq/L (ref 3.5–5.1)
RDW: 17.5 % — ABNORMAL HIGH (ref 11.5–14.6)
Sodium: 139 meq/L (ref 135–145)
Specific Gravity, Urine: 1.02

## 2009-11-07 ENCOUNTER — Ambulatory Visit: Payer: Self-pay | Admitting: Internal Medicine

## 2009-11-08 ENCOUNTER — Ambulatory Visit: Payer: Self-pay | Admitting: Family Medicine

## 2009-11-08 LAB — CONVERTED CEMR LAB
INR: 1.8
Prothrombin Time: 21.4 s

## 2009-11-14 ENCOUNTER — Ambulatory Visit: Payer: Self-pay | Admitting: Internal Medicine

## 2009-11-18 ENCOUNTER — Ambulatory Visit: Payer: Self-pay | Admitting: Family Medicine

## 2009-11-18 ENCOUNTER — Telehealth: Payer: Self-pay | Admitting: Family Medicine

## 2009-11-18 LAB — CONVERTED CEMR LAB
INR: 2.2
Prothrombin Time: 26.7 s

## 2009-11-21 ENCOUNTER — Telehealth: Payer: Self-pay | Admitting: Internal Medicine

## 2009-11-21 ENCOUNTER — Telehealth (INDEPENDENT_AMBULATORY_CARE_PROVIDER_SITE_OTHER): Payer: Self-pay | Admitting: *Deleted

## 2009-11-21 ENCOUNTER — Encounter: Payer: Self-pay | Admitting: Internal Medicine

## 2009-11-26 ENCOUNTER — Ambulatory Visit: Payer: Self-pay | Admitting: Family Medicine

## 2009-11-26 DIAGNOSIS — M810 Age-related osteoporosis without current pathological fracture: Secondary | ICD-10-CM | POA: Insufficient documentation

## 2009-12-04 ENCOUNTER — Ambulatory Visit: Payer: Self-pay | Admitting: Family Medicine

## 2009-12-04 ENCOUNTER — Encounter (INDEPENDENT_AMBULATORY_CARE_PROVIDER_SITE_OTHER): Payer: Self-pay | Admitting: *Deleted

## 2009-12-04 ENCOUNTER — Encounter: Payer: Self-pay | Admitting: Family Medicine

## 2009-12-06 ENCOUNTER — Encounter: Payer: Self-pay | Admitting: Family Medicine

## 2009-12-09 ENCOUNTER — Telehealth: Payer: Self-pay | Admitting: Family Medicine

## 2009-12-10 ENCOUNTER — Ambulatory Visit: Payer: Self-pay | Admitting: Family Medicine

## 2009-12-10 DIAGNOSIS — J189 Pneumonia, unspecified organism: Secondary | ICD-10-CM

## 2009-12-10 LAB — CONVERTED CEMR LAB: Prothrombin Time: 25.9 s

## 2009-12-11 ENCOUNTER — Encounter (INDEPENDENT_AMBULATORY_CARE_PROVIDER_SITE_OTHER): Payer: Self-pay | Admitting: *Deleted

## 2009-12-12 ENCOUNTER — Telehealth: Payer: Self-pay | Admitting: Family Medicine

## 2009-12-13 ENCOUNTER — Encounter (INDEPENDENT_AMBULATORY_CARE_PROVIDER_SITE_OTHER): Payer: Self-pay | Admitting: *Deleted

## 2009-12-16 ENCOUNTER — Encounter: Payer: Self-pay | Admitting: Family Medicine

## 2009-12-17 ENCOUNTER — Encounter: Payer: Self-pay | Admitting: Internal Medicine

## 2009-12-18 ENCOUNTER — Telehealth: Payer: Self-pay | Admitting: Family Medicine

## 2009-12-19 ENCOUNTER — Ambulatory Visit (HOSPITAL_COMMUNITY): Admission: RE | Admit: 2009-12-19 | Discharge: 2009-12-19 | Payer: Self-pay | Admitting: *Deleted

## 2009-12-19 ENCOUNTER — Telehealth: Payer: Self-pay | Admitting: Family Medicine

## 2009-12-19 ENCOUNTER — Telehealth (INDEPENDENT_AMBULATORY_CARE_PROVIDER_SITE_OTHER): Payer: Self-pay | Admitting: *Deleted

## 2009-12-24 ENCOUNTER — Ambulatory Visit: Payer: Self-pay | Admitting: Family Medicine

## 2009-12-24 DIAGNOSIS — J3489 Other specified disorders of nose and nasal sinuses: Secondary | ICD-10-CM | POA: Insufficient documentation

## 2009-12-27 ENCOUNTER — Ambulatory Visit: Payer: Self-pay | Admitting: Family Medicine

## 2009-12-27 ENCOUNTER — Telehealth: Payer: Self-pay | Admitting: Family Medicine

## 2010-01-08 ENCOUNTER — Ambulatory Visit: Payer: Self-pay | Admitting: Family Medicine

## 2010-01-13 ENCOUNTER — Telehealth: Payer: Self-pay | Admitting: Family Medicine

## 2010-01-15 ENCOUNTER — Ambulatory Visit: Payer: Self-pay | Admitting: Family Medicine

## 2010-01-23 ENCOUNTER — Ambulatory Visit (HOSPITAL_COMMUNITY)
Admission: RE | Admit: 2010-01-23 | Discharge: 2010-01-23 | Payer: Self-pay | Source: Home / Self Care | Attending: Orthopedic Surgery | Admitting: Orthopedic Surgery

## 2010-02-04 ENCOUNTER — Encounter: Payer: Self-pay | Admitting: Family Medicine

## 2010-02-05 ENCOUNTER — Ambulatory Visit: Payer: Self-pay | Admitting: Family Medicine

## 2010-02-05 LAB — CONVERTED CEMR LAB
INR: 2.3
Prothrombin Time: 28.1 s

## 2010-02-12 ENCOUNTER — Ambulatory Visit: Admit: 2010-02-12 | Payer: Self-pay | Admitting: Family Medicine

## 2010-02-18 ENCOUNTER — Ambulatory Visit: Admit: 2010-02-18 | Payer: Self-pay | Admitting: Family Medicine

## 2010-02-22 DIAGNOSIS — Z5181 Encounter for therapeutic drug level monitoring: Secondary | ICD-10-CM

## 2010-02-22 DIAGNOSIS — Z7901 Long term (current) use of anticoagulants: Secondary | ICD-10-CM

## 2010-02-22 DIAGNOSIS — I4891 Unspecified atrial fibrillation: Secondary | ICD-10-CM

## 2010-03-02 ENCOUNTER — Encounter: Payer: Self-pay | Admitting: Internal Medicine

## 2010-03-03 ENCOUNTER — Encounter: Payer: Self-pay | Admitting: Urology

## 2010-03-05 ENCOUNTER — Ambulatory Visit
Admission: RE | Admit: 2010-03-05 | Discharge: 2010-03-05 | Payer: Self-pay | Source: Home / Self Care | Attending: Family Medicine | Admitting: Family Medicine

## 2010-03-09 LAB — CONVERTED CEMR LAB
ALT: 23 units/L (ref 0–40)
ALT: 23 units/L (ref 0–40)
AST: 18 units/L (ref 0–37)
Albumin: 4.1 g/dL (ref 3.5–5.2)
Alkaline Phosphatase: 44 units/L (ref 39–117)
Alkaline Phosphatase: 44 units/L (ref 39–117)
BUN: 22 mg/dL (ref 6–23)
BUN: 22 mg/dL (ref 6–23)
Bilirubin Urine: NEGATIVE
Bilirubin, Direct: 0.2 mg/dL (ref 0.0–0.3)
CO2: 25 meq/L (ref 19–32)
CO2: 25 meq/L (ref 19–32)
Calcium: 9.4 mg/dL (ref 8.4–10.5)
Calcium: 9.4 mg/dL (ref 8.4–10.5)
Chloride: 107 meq/L (ref 96–112)
Creatinine, Ser: 1 mg/dL (ref 0.4–1.5)
Creatinine,U: 237.1 mg/dL
Direct LDL: 77.8 mg/dL
GFR calc Af Amer: 97 mL/min
GFR calc Af Amer: 97 mL/min
Glucose, Bld: 89 mg/dL (ref 70–99)
Glucose, Bld: 89 mg/dL (ref 70–99)
Glucose, Urine, Semiquant: NEGATIVE
HDL: 39.3 mg/dL (ref >39.0)
Hgb A1c MFr Bld: 6.9 % — ABNORMAL HIGH (ref 4.6–6.0)
Hgb A1c MFr Bld: 6.9 % — ABNORMAL HIGH (ref 4.6–6.0)
Potassium: 4.2 meq/L (ref 3.5–5.1)
Potassium: 4.2 meq/L (ref 3.5–5.1)
Sodium: 142 meq/L (ref 135–145)
Specific Gravity, Urine: 1.015
Total Protein: 7.1 g/dL (ref 6.0–8.3)
Triglycerides: 310 mg/dL (ref 0–149)
Triglycerides: 310 mg/dL (ref 0–149)
VLDL: 62 mg/dL — ABNORMAL HIGH (ref 0–40)

## 2010-03-11 NOTE — Progress Notes (Signed)
Summary: [name]  Phone Note Refill Request Message from:  Patient on October 11, 2009 9:09 AM  Refills Requested: Medication #1:  AMBIEN 5 MG TABS take 1-2 by mouth at bedtime as needed for insomnia.   Last Refilled: 09/12/2009 [facility] [place] [phone]  Initial call taken by: Melody Comas,  October 11, 2009 9:11 AM  Follow-up for Phone Call        Refilled per phone note by copland on 9/2 Follow-up by: [name] MD,  October 11, 2009 9:16 AM  Additional Follow-up for Phone Call Additional follow up Details #1::        Ambien not filled..will fill now.  Additional Follow-up by: [name] MD,  October 11, 2009 9:20 AM    Additional Follow-up for Phone Call Additional follow up Details #2::    rx called to pharmacy.Consuello Masse CMA   Follow-up by: Benny Lennert CMA Duncan Dull),  October 11, 2009 9:25 AM  Prescriptions: AMBIEN 5 MG TABS (ZOLPIDEM TARTRATE) take 1-2 by mouth at bedtime as needed for insomnia  #30 x 0   Entered and Authorized by:   [name] MD   Signed by:   [name] MD on 10/11/2009   Method used:   Telephoned to ...       [facility]* (mail-order)       [address]       [place], [place]  [address]       Ph: [phone]       Fax: [phone]   RxID:   [id number]

## 2010-03-11 NOTE — Assessment & Plan Note (Signed)
Summary: F/U ST/CLE   Vital Signs:  Patient profile:   70 year old male Height:      74 inches Weight:      242.8 pounds BMI:     31.29 O2 Sat:      99 % on Room air Temp:     97.8 degrees F oral Pulse rate:   88 / minute Pulse rhythm:   regular Resp:     16 per minute BP sitting:   106 / 70  (left arm) Cuff size:   regular  Vitals Entered By: Benny Lennert CMA Duncan Dull) (September 05, 2009 10:52 AM)  O2 Flow:  Room air  History of Present Illness: Chief complaint follow up sore throat, with some coughing  70 yo s/p recent long ICU stay with intubation, tracheostomy, ARF req dialysis, liver failure, on multiple IV abx in the hospital with complex sepsis,  recent trach removal in past few weeks with worsening ST, cough but with normal pulse ox at 99%, normal RR at 16-18, not tachycardic.  Right ear is hurting all night, sore throat, coughing, wheezing.  Coughing a lot and wheezing Whole chest is sore from coughing.   99  100.5 fever Tmax  s/p day 4 of Amoxicillin s/p treatment with Diflucan for potential candidal espophagitis given heavy ABX use recently.   09/02/2009 OV  complicated patient, status post recent  ICU  stay with  intubation, tracheostomy, acute renal failure requiring dialysis, acute sepsis, and liver failure. Patient of Dr. Ermalene Searing  who is seeing me today in acute situation with some sore throat  multiple rounds of antibiotics recently, including long courses of IV abx. Lastly, the patient was called his nystatin, and he was unable to tolerate this, and ultimately was told his Diflucan. He is continued to be symptomatic. Primarily complaint he is of sore throat.  On Coumadin - 2 mg every day  cbc, iron panel, ferritin, transferrin  08/28/2009 office note Admitted for  left groin cellutitis and Septic shock (strep viridins bacteremia) 6/8 to 7/8 [facility]. Was discharged from rehab 2 days ago.  In hospital treated with 3 IV antibitocs...central line  placed, on right. Had cath placed. Has debrided infection in left abcess. Placed on ventilator for surgery.  Once trach placed6/22...he improved and became more responsive.  Was unable to get off ventilator and was unresonsive at this point.  To get of ventilator..trach was placed Acute renal failure due to sepsis... dialysis temporarily...on discharge 2 days ago 1.9 Liver failure due to sepsis.Marland Kitchennormalized at discharge.  Poor control DM (initial blood sugar on admission 500)...at discharge CBGs improved  on levemir 5 mg two times a day.  Now off metformin. Wife requests referral to endocrinologist.  Janina Mayo removed (coughed out) 7/6 ...on regular diet...no coughing with eating. Healing well now.   While in hosp spent time in ICU on ventilator  Discharged from hospital Wife doing wound care. PT planned for tommorow.  Dr. Reesa Chew (surgeon)... appt for wound check 8/10.   Clinical Review Panels:  CBC   WBC:  13.0 (09/02/2009)   RBC:  3.46 (09/02/2009)   Hgb:  10.6 (09/02/2009)   Hct:  31.1 (09/02/2009)   Platelets:  399.0 (09/02/2009)   MCV  90.1 (09/02/2009)   MCHC  33.9 (09/02/2009)   RDW  17.2 (09/02/2009)   PMN:  57.5 (09/02/2009)   Lymphs:  27.3 (09/02/2009)   Monos:  9.7 (09/02/2009)   Eosinophils:  4.2 (09/02/2009)   Basophil:  1.3 (09/02/2009)  Allergies: 1)  ! Sulfacetamide Sodium (Sulfacetamide Sodium)  Past History:  Past medical, surgical, family and social histories (including risk factors) reviewed, and no changes noted (except as noted below).  Past Medical History: Reviewed history from 09/02/2009 and no changes required. ICU stay, sepsis,  strep viridans. Status post renal failure, liver failure, tracheostomy, with recovery. 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 Father: DIED 20 + MI, EMPHYSEMA Mother: DIED 38, PNA, ? UTERINE CA, METASTATIC Siblings: 4 BROTHERS, MI AGE 77, PROSTATE CA 1 SISTER ALZHEIMER'S, PNA NO MI < 55  Social History: Reviewed history from 05/23/2008 and no changes required. Marital Status: Married X 25 YEARS Children: 1 DAUGHTER, HEALTHY Occupation: RETIRED TRUCK DRIVER EXERCISE:  WALKS TREADMILL 30 MINUTES DIET:  3 MEALS, F& V, SOME WATER, CRYSTAL LIGHT, NO FAST FOOd  Review of Systems      See HPI General:  Complains of fatigue and fever. ENT:  Complains of nasal congestion, postnasal drainage, and sore throat. Resp:  Complains of cough, shortness of breath, sputum productive, and wheezing.  Physical Exam  General:  patient with a great deal of weight loss compared to prior exams Head:  normocephalic and atraumatic.   Ears:  some serous fluid only, not bulging and able to visualize landmarks OK Nose:  no external deformity.   Mouth:  Oral mucosa and oropharynx without lesions or exudates.  Teeth in good repair. Neck:  No deformities, masses, or tenderness noted. Trach scar Lungs:  Normal RR and no acute distress, but exp wheezing on exam with coarse BS, without focal crackles on my exam Heart:  Normal rate and regular rhythm. S1 and S2 normal without gallop, murmur,  click, rub or other extra sounds. Cervical Nodes:  No lymphadenopathy noted   Impression & Recommendations:  Problem # 1:  WHEEZING (ICD-786.07) Patient not acutely ill appearing, pulse ox 99%, RR 16. s/p treatment with Amox, Diflucan, lungs sound worse on exam today  In this case with complex, long ICU course, prior hosp and sepsis, I have asked Pulmonology to see the patient to get their input in the case. There could be potential prior trach site involvement, but i think less likely given global picture. Appt this afternoon.  Problem # 2:  COUGH (ICD-786.2)  Problem # 3:  SORE THROAT (ICD-462)  The following medications were removed from the medication list:    Nystatin 100000 Unit/ml Susp (Nystatin) .[name]... 5-10 cc swish and swallow some if doesn't causing nausea... qid until 48 hours after symtpoms gone. His updated medication list for this problem includes:    Amoxicillin 875 Mg Tabs (Amoxicillin) .[name]... 1 by mouth two times a day  Problem # 4:  ASTHMA, PERSISTENT, MILD (ICD-493.90) wheezing  Problem # 5:  RENAL INSUFFICIENCY (ICD-588.9) improved Cr  Problem # 6:  UNSPECIFIED ANEMIA (ICD-285.9)  His updated medication list for this problem includes:    Ferrous Sulfate 325 (65  Fe) Mg Tabs (Ferrous sulfate) .[name]... Take one tablet 2 times a day  Hgb: 10.6 (09/02/2009)   Hct: 31.1 (09/02/2009)   Platelets: 399.0 (09/02/2009) RBC: 3.46 (09/02/2009)   RDW: 17.2 (09/02/2009)   WBC: 13.0 (09/02/2009) MCV: 90.1 (09/02/2009)   MCHC: 33.9 (09/02/2009) Ferritin: 337.8 (09/02/2009) Iron: 29 (09/02/2009)   % Sat: 13.4 (09/02/2009)  Complete Medication List: 1)  Fluoxetine Hcl 40 Mg Caps (Fluoxetine hcl) .... Once daily 2)  Omeprazole 40 Mg Cpdr (Omeprazole) .[name].. 1 tab by mouth two times a day 3)  Nephrocaps 1 Mg Caps (B complex-c-folic acid) .... One tablet daily 4)  Levemir 100 Unit/ml Soln (Insulin detemir) .... 5 units in am and pm 5)  Ferrous Sulfate 325 (65 Fe) Mg Tabs (Ferrous  sulfate) .... Take one tablet 2 times a day 6)  Klor-con M10 10 Meq Cr-tabs (Potassium chloride crys cr) .... Take one tablet daily for 2 weeks 7)  Metoprolol Succinate 25 Mg Xr24h-tab (Metoprolol succinate) .... One tablet by mouth 2 times daily 8)  Clonazepam 0.5 Mg Tabs (Clonazepam) .[name].. 1 trab by mouth every 8 hours as needed for agitation 9)  Fluconazole 150 Mg Tabs (Fluconazole) .[name].. 1 tab by mouth x 7 days 10)  Amoxicillin 875 Mg Tabs (Amoxicillin) .[name].. 1 by mouth two times a day  Patient Instructions: 1)  Dr. Colletta Maryland - 3:50 at [facility]  Current Allergies (reviewed today): ! SULFACETAMIDE SODIUM (SULFACETAMIDE SODIUM)

## 2010-03-11 NOTE — Assessment & Plan Note (Signed)
Summary: F/U NOT FEELING BETTER/CLE   Vital Signs:  Patient profile:   70 year old male Height:      74 inches Weight:      273.0 pounds BMI:     35.18 Temp:     98.0 degrees F oral Pulse rate:   82 / minute Pulse rhythm:   regular BP sitting:   120 / 70  (left arm) Cuff size:   large  Vitals Entered By: Benny Lennert CMA Duncan Dull) (Jun 25, 2009 10:53 AM)  History of Present Illness: Chief complaint follow up not feeling better.Consuello Masse CMA    Initially seen on 5/3 with acute bronchitis, asthma exac...treated with levaquin x 7 days. Called minimally better 5/6 .[name]. started back Advair two times a day. extended course of prednisone at higher dose longer. Saw Dr. Patsy Lager 5/9 with no improvement. Had completed antibitoics.Marland Kitchenso Dr. Patsy Lager extended both the antibiotics.Marland Kitchenavelox 10 days and prednisone.   Has had some improvement... on last day of avelox.  Has completed prednisone. Breathing improved, but continued restriction oin breathing. Two times a day albuterol  nebs. Continued cough...but mucus clear now.    DM, poor control..recent start on Byetta 5 mcg. Continues metformina and glipizide max.  FBS 320 this AM. ..but on prednisone.  Problems Prior to Update: 1)  Cellulitis and Abscess of Upper Arm and Forearm  (ICD-682.3) 2)  Allergic Rhinitis Cause Unspecified  (ICD-477.9) 3)  Uri  (ICD-465.9) 4)  Asthma, Intrinsic, With Acute Exacerbation  (ICD-493.12) 5)  Chest Xray, Abnormal  (ICD-793.1) 6)  Pneumonia  (ICD-486) 7)  Special Screening Malignant Neoplasm of Prostate  (ICD-V76.44) 8)  Hydronephrosis, Right  (ICD-591) 9)  Encounter For Long-term Use of Other Medications  (ICD-V58.69) 10)  Foot Surgery, Hx of  (ICD-V15.89) 11)  Knee Replacement, Left, Hx of  (ICD-V43.65) 12)  Encounter For Therapeutic Drug Monitoring  (ICD-V58.83) 13)  Aftercare, Long-term Use, Anticoagulants  (ICD-V58.61) 14)  Herpes Zoster W/nervous Complication Nec  (ICD-053.19) 15)   Diverticulosis, Colon  (ICD-562.10) 16)  Pulmonary Embolism, Dvt  (ICD-415.19) 17)  Restless Leg Syndrome  (ICD-333.94) 18)  Obesity, Bmi 35  (ICD-278.00) 19)  Irritable Bowel Syndrome  (ICD-564.1) 20)  Gastroparesis  (ICD-536.3) 21)  Hypertension  (ICD-401.9) 22)  Low Back Pain, Chronic  (ICD-724.2) 23)  Depression  (ICD-311) 24)  Anxiety  (ICD-300.00) 25)  Asthma, Persistent, Mild  (ICD-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 Take 1 Tablet By Mouth Twice A Day 2)  Atenolol 50 Mg Tabs (Atenolol) .... Once Daily 3)  Metformin Hcl 1000 Mg Tabs (Metformin Hcl) .... Two Times A Day 4)  Calan Sr 180 Mg Cr-Tabs (Verapamil Hcl) .... Take 1 Tablet By Mouth Once A Day 5)  Warfarin Sodium 2.5 Mg Tabs (Warfarin Sodium) .... As Directed Daily 6)  Fluoxetine Hcl 40 Mg Caps (Fluoxetine Hcl) .... Once Daily 7)  Fenofibrate 160 Mg Tabs (Fenofibrate) .... Take 1 Tablet By Mouth Once A Day 8)  Indomethacin 50 Mg  Caps (Indomethacin) .... Every 8 Hours As Needed 9)  Lasix 20 Mg  Tabs (Furosemide) .... Take 1 Tablet By Mouth Once A Day By Mouth As Needed Swelling 10)  Simvastatin 80 Mg Tabs (Simvastatin) .... Take 1 Tablet By Mouth Once A Day 11)  Fish Oil Concentrate 1000 Mg  Caps (Omega-3  Fatty Acids) .... Take 2 Capsule By Mouth Two Times A Day 12)  Ascensia Autodisc Test   Strp (Glucose Blood) .... Test 1-2 Times Per Day 13)  Clobetasol Propionate 0.05 %  Soln (Clobetasol Propionate) .... Apply Once A Day As Needed 14)  Triamcinolone Acetonide 0.025 %  Crea (Triamcinolone Acetonide) .... Apply Once A Day As Needed 15)  Omeprazole 40 Mg Cpdr (Omeprazole) .[name].. 1 Tab By Mouth Two Times A Day 16)  Glipizide Xl 10 Mg Xr24h-Tab (Glipizide) .... Take 1 Tablet By Mouth Once A Day 17)   Bayer Breeze 2 Test  Disk (Glucose Blood) .... Check Cbg 2 Times Daily Dx 250.01 18)  Fish Oil   Oil (Fish Oil) .... 2 By Mouth Daily 19)  Byetta 10 Mcg Pen 10 Mcg/0.11ml Soln (Exenatide) .[name].. 1 Inj Subcutaneously Two Times A Day 20)  Qc Pen Needles 31g X 8 Mm Misc (Insulin Pen Needle) .... Use 1 Needle 2 Times Daily 21)  Advair Diskus 250-50 Mcg/dose Aepb (Fluticasone-Salmeterol) .[name].. 1 Puff Two Times A Day 22)  Prednisone 10 Mg Tabs (Prednisone) .... 2 Days of 2 Tabs, Then 4 Days of 1 Tab Then 4days of 1/2 Tab Daily  Allergies: 1)  ! Sulfacetamide Sodium (Sulfacetamide Sodium)  Past History:  Past medical, surgical, family and social histories (including risk factors) reviewed, and no changes noted (except as noted below).  Past Medical History: Reviewed history from 05/23/2008 and no changes required. 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 Father: DIED 95 + MI, EMPHYSEMA Mother: DIED 98, PNA, ? UTERINE CA,  METASTATIC Siblings: 4 BROTHERS, MI AGE 35, PROSTATE CA 1 SISTER ALZHEIMER'S, PNA NO MI < 55  Social History: Reviewed history from 05/23/2008 and no changes required. Marital Status: Married X 25 YEARS Children: 1 DAUGHTER, HEALTHY Occupation: RETIRED TRUCK DRIVER EXERCISE:  WALKS TREADMILL 30 MINUTES DIET:  3 MEALS, F& V, SOME WATER, CRYSTAL LIGHT, NO FAST FOOd  Review of Systems General:  Denies fatigue and fever. CV:  Denies chest pain or discomfort. Resp:  Complains of shortness of breath, sputum productive, and wheezing. GI:  Denies abdominal pain.  Physical Exam  General:  Well-developed,well-nourished,in no acute distress; alert,appropriate and cooperative throughout examination Eyes:  No corneal or conjunctival inflammation noted. EOMI. Perrla. Funduscopic exam benign, without hemorrhages, exudates or papilledema. Vision grossly normal. Ears:  no external deformities.   Nose:  no external deformity.   Mouth:  Oral mucosa and oropharynx without lesions or exudates.  Teeth in good repair. Neck:  No deformities, masses, or tenderness noted. Lungs:  normal respiratory effort, improved aitrmovement threough out, rare exp wheeze, prolongued exp phase.  Heart:  Normal rate and regular rhythm. S1 and S2 normal without gallop, murmur, click, rub or other extra sounds.   Impression & Recommendations:  Problem # 1:  ASTHMA,  INTRINSIC, WITH ACUTE EXACERBATION (ICD-493.12) Improving..conitnue wean of prednisone over next week. No clear sign of continued infection.  Continue albuterol nebs and Advair.  The following medications were removed from the medication list:    Prednisone 20 Mg Tabs (Prednisone) .[name]... 2 tabs by mouth daily for 3 days, 1 tab by mouth for 3 days His updated medication list for this problem includes:    Advair Diskus 250-50 Mcg/dose Aepb (Fluticasone-salmeterol) .[name]... 1 puff two times a day    Prednisone 10 Mg Tabs (Prednisone) .[name]... 2 days of 2 tabs, then 4  days of 1 tab then 4days of 1/2 tab daily  Orders: Prescription Created Electronically [phone])  Problem # 2:  PNEUMONIA (ICD-486) Resolved infection.  The following medications were removed from the medication list:    Avelox 400 Mg Tabs (Moxifloxacin hcl) .[name]... 1 tablet by mouth daily  Problem # 3:  DM (ICD-250.00) poor control...on prednisone much worse. Increase Byetta to 10 micrograms daily.  His updated medication list for this problem includes:    Metformin Hcl 1000 Mg Tabs (Metformin hcl) .[name]..[name] Two times a day    Glipizide Xl 10 Mg Xr24h-tab (Glipizide) .[name]... Take 1 tablet by mouth once a day    Byetta 10 Mcg Pen 10 Mcg/0.67ml Soln (Exenatide) .[name]... 1 inj subcutaneously two times a day  Complete Medication List: 1)  Clorazepate Dipotassium 3.75 Mg Tabs (Clorazepate dipotassium) .... Take 1 tablet by mouth twice a day 2)  Atenolol 50 Mg Tabs (Atenolol) .... Once daily 3)  Metformin Hcl 1000 Mg Tabs (Metformin hcl) .... Two times a day 4)  Calan Sr 180 Mg Cr-tabs (Verapamil hcl) .... Take 1 tablet by mouth once a day 5)  Warfarin Sodium 2.5 Mg Tabs (Warfarin sodium) .... As directed daily 6)  Fluoxetine Hcl 40 Mg Caps (Fluoxetine hcl) .... Once daily 7)  Fenofibrate 160 Mg Tabs (Fenofibrate) .... Take 1 tablet by mouth once a day 8)  Indomethacin 50 Mg Caps (Indomethacin) .... Every 8 hours as needed 9)  Lasix 20 Mg Tabs (Furosemide) .... Take 1 tablet by mouth once a day by mouth as needed swelling 10)  Simvastatin 80 Mg Tabs (Simvastatin) .... Take 1 tablet by mouth once a day 11)  Fish Oil Concentrate 1000 Mg Caps (Omega-3 fatty acids) .... Take 2 capsule by mouth two times a day 12)  Ascensia Autodisc Test Strp (Glucose blood) .... Test 1-2 times per day 13)  Clobetasol Propionate 0.05 % Soln (Clobetasol propionate) .... Apply once a day as needed 14)  Triamcinolone Acetonide 0.025 % Crea (Triamcinolone acetonide) .... Apply once a day as needed 15)  Omeprazole 40 Mg Cpdr  (Omeprazole) .[name].. 1 tab by mouth two times a day 16)  Glipizide Xl 10 Mg Xr24h-tab (Glipizide) .... Take 1 tablet by mouth once a day 17)  Bayer Breeze 2 Test Disk (Glucose blood) .... Check cbg 2 times daily dx 250.01 18)  Fish Oil Oil (Fish oil) .... 2 by mouth daily 19)  Byetta 10 Mcg Pen 10 Mcg/0.38ml Soln (Exenatide) .[name].. 1 inj subcutaneously two times a day 20)  Qc Pen Needles 31g X 8 Mm Misc (Insulin pen needle) .... Use 1 needle 2 times daily 21)  Advair Diskus 250-50 Mcg/dose Aepb (Fluticasone-salmeterol) .[name].. 1 puff two times a day 22)  Prednisone 10 Mg Tabs (Prednisone) .... 2 days of 2 tabs, then 4 days of 1 tab then 4days of 1/2 tab daily  Patient Instructions: 1)  Increase Byetta to 10  micrograms daily.  2)  Continue slow wean from predniosne as directed. Call if symptoms not improving further.  3)  Call if blood sugars remain high once off prednisone.  Prescriptions: PREDNISONE 10 MG TABS (PREDNISONE) 2 days of 2 tabs, then 4 days of 1 tab then 4days of 1/2 tab daily  #10 x 0   Entered and Authorized by:   [name] MD   Signed by:   [name] MD on 06/25/2009   Method used:   Electronically to        [facility] [id number] (retail)       [address]       [place], [place]  [address]       Ph: [phone] or [phone]       Fax: [phone]   RxID:   [id number] BYETTA 10 MCG PEN 10 MCG/0.04ML SOLN (EXENATIDE) 1 inj Subcutaneously two times a day  #3 month x 3   Entered and Authorized by:   [name] MD   Signed by:   [name] MD on 06/25/2009   Method used:   Electronically to        [facility]* (mail-order)       [address]       [place], [place]  [address]       Ph: [phone]       Fax: [phone]   RxID:   [id number]    Current Allergies (reviewed today): ! SULFACETAMIDE SODIUM (SULFACETAMIDE SODIUM)

## 2010-03-11 NOTE — Letter (Signed)
Summary: [facility]   Imported By: Lanelle Bal 10/16/2009 14:03:17  _____________________________________________________________________  External Attachment:    Type:   Image     Comment:   External Document

## 2010-03-11 NOTE — Assessment & Plan Note (Signed)
Summary: lung infection/ mbw   Visit Type:  Follow-up Copy to:  DR [name] Primary Provider/Referring Provider:  [name] MD and Dr Karleen Hampshire Copland  CC:  Pt here c/o productive cough, chest tightness, and and headache and nasal congestion since July 2011.  Pt has been on several rounds of abx and prednisone wihtout relief..  History of Present Illness: OV 09/05/2009: Urgent hospital followup for this 70 year old diabetic who suffered acute and chronic critical illness with Strep viridians bacteremia and multiorgan failure. 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 Molli Knock. Dishcarged from hospital around 08/16/2009 and from inpatient reab several weeks later. Post discharge he saw PMD on 08/28/2009 and had complained of sorethroat. Nystatin was prescribed the following day 7/21 but he revisisted 09/02/2009 with sore throat, wheeze (points to throat), cough, and even low grade fever. Rapid strep wasnegative but given 10% false negative rate he was treated with empiric amoxcilin. However, he represted to PMD office earlier today 09/05/2009 for persistent symptoms. THerefore referred here. HE is not too keen on admission. REC: ENT EVAL    OV November 07, 2009: Since last visit saw Dr. Jenne Pane 09/12/2009, 09/17/2009 and 09/26/2009. Also saw a Dr. Delford Field 09/25/2009. Per hx and review of records he has been diagnosed with laryngeal edema and neuropathy. He is s/p 1 course augmentin and short pred burst in early august and 4 week neurontin ending mid-sept 2011. AFter this, voice issues are completely resolved per him and wife. Main issue is now is recurrent episodes of chest tightness "something is squeezing my chest and is not chest pain". With these episodes ther is cough and dyspnea as well. HAs needed 4-5 rounds of antibiotics/pred which help each time immensely. However, each time he comes off steroids/abx, episodes relapse. Dr. Patsy Lager notes "remote hx of asthma" and ICU notes from June say "chronic steroids for  asthma / joint pain" but he and wife deny that there was chronic use but admit to frequent intermittent use prior to ICU illness. Also, feels fatgiued. Currently on cipro for prostatitis   Preventive Screening-Counseling & Management  Alcohol-Tobacco     Smoking Status: never  Current Medications (verified): 1)  Fluoxetine Hcl 40 Mg Caps (Fluoxetine Hcl) .... Once Daily 2)  Omeprazole 40 Mg Cpdr (Omeprazole) .[name].. 1 Tab By Mouth Two Times A Day 3)  Nephrocaps 1 Mg Caps (B Complex-C-Folic Acid) .... One Tablet Daily 4)  Levemir 100 Unit/ml Soln (Insulin Detemir) .[name].. 18 Units in Am Once A Day 5)  Ferrous Sulfate 325 (65 Fe) Mg Tabs (Ferrous Sulfate) .... Take One Tablet 2 Times A Day 6)  Metoprolol Succinate 25 Mg Xr24h-Tab (Metoprolol Succinate) .... One Tablet By Mouth 2 Times Daily 7)  Clonazepam 0.5 Mg Tabs (Clonazepam) .[name].. 1 Trab By Mouth Every 8 Hours As Needed For Agitation 8)  Novolog 100 Unit/ml Soln (Insulin Aspart) .... 6 Units With Every Meal + Slinding Scale 9)  Hydrocodone-Acetaminophen 10-325 Mg Tabs (Hydrocodone-Acetaminophen) .[name].. 1-2 Every 4-6 Hours As Needed Max Acetominophen Is 4000 Mg Daily 10)  Ambien 5 Mg Tabs (Zolpidem Tartrate) .... Take 1-2 By Mouth At Bedtime As Needed For Insomnia 11)  Coumadin 2.5 Mg Tabs (Warfarin Sodium) .... As Directed 12)  Ciprofloxacin Hcl 500 Mg Tabs (Ciprofloxacin Hcl) .... Take 1 Tablet By Mouth Every 12 Hours X 10 Days  Allergies (verified): 1)  ! Sulfacetamide Sodium (Sulfacetamide Sodium)  Past History:  Past medical, surgical, family and social histories (including risk factors) reviewed, and no changes  noted (except as noted below).  Past Medical History: Reviewed history from 09/02/2009 and no changes required. ICU stay, sepsis,  strep viridans. Status post renal failure, liver failure, tracheostomy, with recovery. 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 2004 EGD: DUODENITIS WITH HEMORRHAGE BACK SURGERY KNEE SURGERY, BIL X 6 1998 L ARTERY FOREARM INJURY FOOT SURGERY, HX OF (ICD-V15.89) KNEE REPLACEMENT, LEFT, HX OF (ICD-V43.65)  Past Pulmonary History:  Pulmonary History:  DATE OF ADMISSION:  07/17/2009   DATE OF DISCHARGE:  08/16/2009                                FINAL DIAGNOSES:  As follows:   1. Tracheostomy dependence, following prolonged ventilator dependent       respiratory failure in the setting of severe sepsis septic shock       and acute lung injury.   2. Acute renal failure.   3. Atrial fibrillation (chronic).   4. Anemia.   5. Resolved left groin cellulitis/fasciitis.   6. Resolved strep viridans bacteremia.   7. Diabetes type 2.   8. Debilitation.   9. Protein calorie malnutrition.   Family History: Reviewed history from 05/23/2008 and no changes required. Father: DIED 74 + MI, EMPHYSEMA Mother: DIED 73, PNA, ? UTERINE CA, METASTATIC Siblings: 4 BROTHERS, MI AGE 34, PROSTATE CA 1 SISTER ALZHEIMER'S, PNA NO MI < 55  Social History: Reviewed history from 05/23/2008 and no changes required. Marital Status: Married X 25 YEARS Children: 1 DAUGHTER, HEALTHY Occupation: RETIRED TRUCK DRIVER EXERCISE:  WALKS TREADMILL 30 MINUTES DIET:  3 MEALS, F& V, SOME WATER, CRYSTAL LIGHT, NO FAST FOOd Patient never smoked.   Review of Systems       The patient complains of productive cough, headaches, and nasal  congestion/difficulty breathing through nose.  The patient denies shortness of breath with activity, shortness of breath at rest, non-productive cough, coughing up blood, chest pain, irregular heartbeats, acid heartburn, indigestion, loss of appetite, weight change, abdominal pain, difficulty swallowing, sore throat, tooth/dental problems, sneezing, itching, ear ache, anxiety, depression, hand/feet swelling, joint stiffness or pain, rash, change in color of mucus, and fever.         chest tightness  Vital Signs:  Patient profile:   70 year old male Height:      74 inches Weight:      261.56 pounds BMI:     33.70 O2 Sat:      97 % on Room air Temp:     97.9 degrees F oral Pulse rate:   80 / minute BP sitting:   130 / 82  (right arm) Cuff size:   large  Vitals Entered By: Carron Curie CMA (November 07, 2009 9:31 AM)  O2 Flow:  Room air  Physical Exam  General:  obese.  looks stronger Head:  normocephalic and atraumatic.   Eyes:  PERRLA/EOM intact; conjunctiva and sclera clear Nose:  no external deformity.   Mouth:  Oral mucosa and oropharynx without lesions or exudates.  Teeth in good repair. Neck:  HOARSE VOICE - NO LONGER tTRACH DECANNULATED -  SCAR + Chest Wall:  no deformities or breast masses noted Lungs:  Normal RR and no acute distress, but exp wheezing on exam with coarse BS, without focal crackles on my exam Heart:  Normal rate and regular rhythm. S1 and S2 normal without gallop, murmur, click, rub or other extra sounds. Abdomen:  Bowel sounds positive,abdomen soft and non-tender without masses, organomegaly or hernias noted. Msk:  Back normal, normal gait. Muscle strength and tone normal. Pulses:  R and L posterior tibial pulses are full and equal bilaterally  Extremities:  B varicose veins Neurologic:  alert & oriented X3.   Skin:  Intact without suspicious lesions or rashes Cervical Nodes:  No lymphadenopathy noted Axillary Nodes:  no significant  adenopathy Psych:  depressed affect.     Impression & Recommendations:  Problem # 1:  CHEST DISCOMFORT (ICD-786.59) Assessment New  Has recurrent chest tightness, wheezing episodes post ICU illness needing frequent antibitoics and steroids. Suspect asthma. Will get full PFTs. IF these are normal, get methacholine challenge test  Orders: Est. Patient Level IV (37106)  Problem # 2:  ACUTE RESPIRATORY FAILURE (ICD-518.81) Assessment: Improved Is depressed, fatigued - all sequelae of surviviing critical illness  plan refer pulm rehab at [place] [facility] Orders: Rehabilitation Referral (Rehab) Est. Patient Level IV (26948)  Medications Added to Medication List This Visit: 1)  Coumadin 2.5 Mg Tabs (Warfarin sodium) .... As directed  Other Orders: T-2 View CXR (71020TC) Pulmonary Referral (Pulmonary)  Patient Instructions: 1)  your walking oxygen test is fine 2)  please have full breathing test called PFT 3)  have cxr 4)  once you are done with that, call us 5)  I will revie those over phone and decide next step 6)  refer to rehab at [place] 7)  have flu shot when finished with cipro    Appended Document: lung infection/ mbw Ambulatory Pulse Oximetry  Resting; HR___73__    02 Sat__96___  Lap1 (185 feet)   HR__109___   02 Sat___95__ Lap2 (185 feet)   HR___125__   02 Sat___95__    Lap3 (185 feet)   HR___132__   02 Sat_94____  __x_Test Completed without Difficulty ___Test Stopped due to:

## 2010-03-11 NOTE — Progress Notes (Signed)
Summary: pt/inr before surgery  Phone Note Other Incoming   CallerAlbin [phone] Summary of Call: Patient is scheduled for surgery next thursday. His INR needs to be no more than 1.5.  Albin Felling is asking if he needs to hold a couple of days, and he will aslo need to have it checked before next thursday. Albin Felling will also be faxing over a surgical clearance.  Initial call taken by: Melody Comas,  December 12, 2009 11:26 AM  Follow-up for Phone Call        Have him hold coumadin 2 days prior to surgery. I will take a look at surgical clearance today. Follow-up by: [name] MD,  December 13, 2009 10:58 AM  Additional Follow-up for Phone Call Additional follow up Details #1::        Patient advised.Consuello Masse CMA   Additional Follow-up by: Benny Lennert CMA Duncan Dull),  December 13, 2009 11:28 AM

## 2010-03-11 NOTE — Progress Notes (Signed)
  Phone Note Other Incoming   Request: Send information Summary of Call: Faxed PFT result's to [facility]- [phone]

## 2010-03-11 NOTE — Progress Notes (Signed)
  Phone Note Refill Request Message from:  Patient on November 18, 2009 10:14 AM  Refills Requested: Medication #1:  HYDROCODONE-ACETAMINOPHEN 10-325 MG TABS 1-2 every 4-6 hours as needed MAX acetominophen is 4000 mg daily   Supply Requested: [facility] month [facility]   Method Requested: Telephone to Pharmacy Initial call taken by: Benny Lennert CMA Duncan Dull),  November 18, 2009 10:14 AM  Follow-up for Phone Call        Rx called to pharmacy Follow-up by: Benny Lennert CMA Duncan Dull),  November 19, 2009 10:27 AM    Prescriptions: HYDROCODONE-ACETAMINOPHEN 10-325 MG TABS (HYDROCODONE-ACETAMINOPHEN) 1-2 every 4-6 hours as needed MAX acetominophen is 4000 mg daily  #30 x 0   Entered and Authorized by:   [name] MD   Signed by:   [name] MD on 11/19/2009   Method used:   Telephoned to ...       [facility]* (mail-order)       [address]       [place], [place]  [address]       Ph: [phone]       Fax: [phone]   RxID:   [id number]

## 2010-03-11 NOTE — Progress Notes (Signed)
Summary: still has sore throat  Phone Note Call from Patient Call back at Home Phone [phone]   Caller: Spouse Summary of Call: Pt was seen yesterday for hospital follow up and had a sore throat- this is worse today.  No fever.  Wife was told to call and report if still sore today.  [facility]. Initial call taken by: Lowella Petties CMA,  August 29, 2009 8:46 AM  Follow-up for Phone Call        This is an exceedingly complex case - my partner is here tomorrow. It looks as if trach was recently removed.   I am going to get Dr. Daphine Deutscher input. I believe I could do harm trying to treat anything blindly.  Recent heavy ABX - candidal esophagitis could be. Follow-up by: Hannah Beat MD,  August 29, 2009 9:06 AM  Additional Follow-up for Phone Call Additional follow up Details #1::        Candida is a good thought.  Doubt irritation from trach as trach removed 2 weeks ago.  Ask pt if any other symptoms...white plaque on tounge (none seen at recent appt) or congestion, post neasal drip, sneeze, cough, sob? Additional Follow-up by: [name] MD,  August 30, 2009 8:26 AM    Additional Follow-up for Phone Call Additional follow up Details #2::    Patient does have sneeze,no cough, and no fever, patient wife says that he didnt sleep at all last night he feels like his throat is closing. Patient also does not have any white plaque on the tongue Follow-up by: Benny Lennert CMA Duncan Dull),  August 30, 2009 8:47 AM  Additional Follow-up for Phone Call Additional follow up Details #3:: Details for Additional Follow-up Action Taken: Will treat with nystatin swish for possible yeast given recnt numerous antibitoics...if cannot swallow or breath...GO TO ER.   Advised pt's wife.         Lowella Petties CMA  August 30, 2009 9:44 AM  Additional Follow-up by: [name] MD,  August 30, 2009 9:39 AM  New/Updated Medications: NYSTATIN 100000 UNIT/ML SUSP (NYSTATIN) 5-10 cc swish and swallow some  if doesn't causing nausea... QID until 48 hours after symtpoms gone. Prescriptions: NYSTATIN 100000 UNIT/ML SUSP (NYSTATIN) 5-10 cc swish and swallow some if doesn't causing nausea... QID until 48 hours after symtpoms gone.  #1 bottle x 1   Entered and Authorized by:   [name] MD   Signed by:   [name] MD on 08/30/2009   Method used:   [facility] to        [facility] (retail)       [address]       [place], [place]  [address]       Ph: [phone] or [phone]       Fax: [phone]   RxID:   [id number]

## 2010-03-11 NOTE — Letter (Signed)
Summary: [facility]  [facility]   Imported By: Lester Donaldson 09/27/2009 10:42:32  _____________________________________________________________________  External Attachment:    Type:   Image     Comment:   External Document

## 2010-03-11 NOTE — Progress Notes (Signed)
Summary: form for diabetic supplies  Phone Note From Pharmacy   Caller: [facility] Summary of Call: Form for diabetic supplies is on your desk. Initial call taken by: Lowella Petties CMA,  September 24, 2009 10:12 AM

## 2010-03-11 NOTE — Assessment & Plan Note (Signed)
Summary: wheezing/per Dr Copland//lmr   Visit Type:  Hospital Follow-up Copy to:  DR [name] Primary Provider/Referring Provider:  [name] MD  CC:  Dr. Lurlean Nanny referred him to be seen today bc he was having a lot of wheezing, pt states his throat is raw since last friday, Productive cough with clear phlem, pt has some real bad chest pain, and since monday.  History of Present Illness: OV 09/05/2009: Urgent hospital followup for this 70 year old diabetic who suffered acute and chronic critical illness with Strep viridians bacteremia and multiorgan failure. 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 Molli Knock. Dishcarged from hospital around 08/16/2009 and from inpatient reab several weeks later. Post discharge he saw PMD on 08/28/2009 and had complained of sorethroat. Nystatin was prescribed the following day 7/21 but he revisisted 09/02/2009 with sore throat, wheeze (points to throat), cough, and even low grade fever. Rapid strep wasnegative but given 10% false negative rate he was treated with empiric amoxcilin. However, he represted to PMD office earlier today 09/05/2009 for persistent symptoms. THerefore referred here. HE is not too keen on admission.   Preventive Screening-Counseling & Management  Alcohol-Tobacco     Smoking Status: never  Caffeine-Diet-Exercise     Does Patient Exercise: yes     Type of exercise: walking     Exercise (avg: min/session): 30 min.     Times/week: 5  Current Medications (verified): 1)  Fluoxetine Hcl 40 Mg Caps (Fluoxetine Hcl) .... Once Daily 2)  Omeprazole 40 Mg Cpdr (Omeprazole) .[name].. 1 Tab By Mouth Two Times A Day 3)  Nephrocaps 1 Mg Caps (B Complex-C-Folic Acid) .... One Tablet Daily 4)  Levemir 100 Unit/ml Soln (Insulin Detemir) .[name].. 14 Units in Am Once A Day 5)  Ferrous Sulfate 325 (65 Fe) Mg Tabs (Ferrous Sulfate) .... Take One Tablet 2 Times A Day 6)  Klor-Con M10 10 Meq Cr-Tabs (Potassium Chloride Crys Cr) .... Take One Tablet Daily For 2 Weeks 7)   Metoprolol Succinate 25 Mg Xr24h-Tab (Metoprolol Succinate) .... One Tablet By Mouth 2 Times Daily 8)  Clonazepam 0.5 Mg Tabs (Clonazepam) .[name].. 1 Trab By Mouth Every 8 Hours As Needed For Agitation 9)  Fluconazole 150 Mg Tabs (Fluconazole) .[name].. 1 Tab By Mouth X 7 Days 10)  Amoxicillin 875 Mg Tabs (Amoxicillin) .[name].. 1 By Mouth Two Times A Day  Allergies (verified): 1)  ! Sulfacetamide Sodium (Sulfacetamide Sodium)  Past History:  Family History: Last updated: 05-30-08 Father: DIED 52 + MI, EMPHYSEMA Mother: DIED 49, PNA, ? UTERINE CA, METASTATIC Siblings: 4 BROTHERS, MI AGE 82, PROSTATE CA 1 SISTER ALZHEIMER'S, PNA NO MI < 55  Social History: Last updated: 05/30/2008 Marital Status: Married X 25 YEARS Children: 1 DAUGHTER, HEALTHY Occupation: RETIRED TRUCK DRIVER EXERCISE:  WALKS TREADMILL 30 MINUTES DIET:  3 MEALS, F& V, SOME WATER, CRYSTAL LIGHT, NO FAST FOOd  Risk Factors: Exercise: yes (09/05/2009)  Risk Factors: Smoking Status: never (09/05/2009)  Past Medical History: Reviewed history from 09/02/2009 and no changes required. ICU stay, sepsis,  strep viridans. Status post renal failure, liver failure, tracheostomy, with recovery. 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  2004 EGD: DUODENITIS WITH HEMORRHAGE BACK SURGERY KNEE SURGERY, BIL X 6 1998 L ARTERY FOREARM INJURY FOOT SURGERY, HX OF  (ICD-V15.89) KNEE REPLACEMENT, LEFT, HX OF (ICD-V43.65)  Past Pulmonary History:  Pulmonary History:  DATE OF ADMISSION:  07/17/2009   DATE OF DISCHARGE:  08/16/2009                                FINAL DIAGNOSES:  As follows:   1. Tracheostomy dependence, following prolonged ventilator dependent       respiratory failure in the setting of severe sepsis septic shock       and acute lung injury.   2. Acute renal failure.   3. Atrial fibrillation (chronic).   4. Anemia.   5. Resolved left groin cellulitis/fasciitis.   6. Resolved strep viridans bacteremia.   7. Diabetes type 2.   8. Debilitation.   9. Protein calorie malnutrition.   Family History: Reviewed history from 05/23/2008 and no changes required. Father: DIED 74 + MI, EMPHYSEMA Mother: DIED 37, PNA, ? UTERINE CA, METASTATIC Siblings: 4 BROTHERS, MI AGE 108, PROSTATE CA 1 SISTER ALZHEIMER'S, PNA NO MI < 55  Social History: Reviewed history from 05/23/2008 and no changes required. Marital Status: Married X 25 YEARS Children: 1 DAUGHTER, HEALTHY Occupation: RETIRED TRUCK DRIVER EXERCISE:  WALKS TREADMILL 30 MINUTES DIET:  3 MEALS, F& V, SOME WATER, CRYSTAL LIGHT, NO FAST FOOd  Review of Systems       The patient complains of shortness of breath with activity, productive cough, chest pain, acid heartburn, indigestion, loss of appetite, weight change, difficulty swallowing, and sore throat.         lost 50 lbs in past 10-12 weeks  Vital Signs:  Patient profile:   70 year old male Height:      74 inches Weight:      242.2 pounds BMI:     31.21 O2 Sat:      97 % on Room air Temp:     98.0 degrees F oral Pulse rate:   110 / minute BP sitting:   120 / 72  (right arm) Cuff size:   large  Vitals Entered By: Carver Fila (September 05, 2009 3:34 PM)  O2 Flow:  Room air CC: Dr. Lurlean Nanny referred him to be seen today bc he was having a lot of wheezing, pt states his throat is raw since last friday, Productive cough with  clear phlem, pt has some real bad chest pain, since monday Is Patient Diabetic? Yes Comments meds and allergies updated Phone number updated Carver Fila  September 05, 2009 3:34 PM    Physical Exam  General:  ill appearing chronically Head:  normocephalic and atraumatic.   Eyes:  PERRLA/EOM intact; conjunctiva and sclera clear Nose:  no external deformity.   Mouth:  Oral mucosa and oropharynx without lesions or exudates.  Teeth in good repair. Neck:  HOARSE VOICE tTRACH DECANNULATED Chest Wall:  no deformities or breast masses noted Lungs:  Normal RR and no acute distress, but exp wheezing on exam with coarse BS, without focal crackles on my exam Heart:  Normal rate and regular rhythm. S1 and S2 normal without gallop, murmur, click, rub or other extra sounds. Abdomen:  Bowel sounds positive,abdomen soft and non-tender without masses, organomegaly or hernias noted. Msk:  Back normal, normal gait. Muscle strength and tone normal. Pulses:  R and L posterior tibial pulses are full and equal  bilaterally  Extremities:  B varicose veins Neurologic:  alert & oriented X3.   Skin:  Intact without suspicious lesions or rashes Cervical Nodes:  No lymphadenopathy noted Axillary Nodes:  no significant adenopathy Psych:  depressed affect.     Impression & Recommendations:  Problem # 1:  UNSPECIFIED TRACHEOSTOMY COMPLICATION (ICD-519.00) Assessment New He has failed antibiotic and antifngal Rx for his sore throat that has developed post trach decannulation. I am actually worried that he might have some of infectious complications following tracheostomy. I offered admission but he is not keen on it. We have gotten him an ENT appt for 09/06/2009. Will check bmet, blood culture and cbcd to ensure there is nothing acute that requires admissin. If so, I will call him wiht result and get him admitted Orders: T-Culture, Blood Routine ([id number]) ENT Referral (ENT) Est. Patient Level IV ([address])  Problem  # 2:  FEVER UNSPECIFIED (ICD-780.60) Assessment: Deteriorated see above Orders: T-Culture, Blood Routine ([id number]) Est. Patient Level IV ([address]86)  Medications Added to Medication List This Visit: 1)  Levemir 100 Unit/ml Soln (Insulin detemir) .[name].. 14 units in am once a day  Other Orders: TLB-BMP (Basic Metabolic Panel-BMET) (80048-METABOL) TLB-CBC Platelet - w/Differential ([id number]) TLB-Hepatic/Liver Function Pnl (80076-HEPATIC) TLB-CRP-High Sensitivity (C-Reactive Protein) (86140-FCRP)  Patient Instructions: 1)  do blood test today 2)  i will call you with results 3)  see ENT doctor 09/06/2009 4)  if you worsen tonight go to ER   Immunization History:  Influenza Immunization History:    Influenza:  historical (11/09/2008)  Pneumovax Immunization History:    Pneumovax:  historical (11/09/2008)

## 2010-03-11 NOTE — Miscellaneous (Signed)
Summary: BONE DENSITY  Clinical Lists Changes  Orders: Added new Test order of T-Bone Densitometry (77080) - Signed Added new Test order of T-Lumbar Vertebral Assessment (77082) - Signed 

## 2010-03-11 NOTE — Assessment & Plan Note (Signed)
Summary: 3 m f/u dlo  R/S FROM 09/17/09   Vital Signs:  Patient profile:   70 year old male Height:      74 inches Weight:      254.8 pounds BMI:     32.83 Temp:     98.0 degrees F oral Pulse rate:   110 / minute Pulse rhythm:   regular BP sitting:   112 / 72  (left arm) Cuff size:   regular  Vitals Entered By: Benny Lennert CMA Duncan Dull) (September 23, 2009 9:19 AM)  History of Present Illness: Chief complaint 3 month follow up  70 yo s/p recent long ICU stay with intubation, tracheostomy, ARF req dialysis, liver failure, on multiple IV abx in the hospital with complex sepsis,  recent trach removal in past few weeks   Seen at pulm for severe sore throat, fever...Marland Kitchenon 7/28 OV note from this appt below... Failed antibiotic and antifngal Rx for his sore throat that has developed post trach decannulation. I am actually worried that he might have some of infectious complications following tracheostomy. I offered admission but he is not keen on it. We have gotten him an ENT appt for 09/06/2009. Will check bmet, blood culture and cbcd to ensure there is nothing acute that requires admissin. If so, I will call him with result and get him admitted  Culture returned neg, labs were stable.   Today he reports: ENT  (Dr. Barbara Cower done 3 laryngoscopy...last week looked even further down. Only finding, mild swelling. No infection seen.  Placced on augmentin and prednisone  ST is gradually improve some, talking caused headache, also has no volume of voice...has appt scheduled with ENT Dr Delford Field at [facility].  Using hydrocodone for headache, throat pain pain...Dr. Jenne Pane told him to increase hydrocodone to 2 tabs every 4 to six hours.  Breathing has improved..so no wheezing.  Other ongoing issues:  DM, poor control...has started seeing Dr. Sharl Ma ENDO..Now on Levemir daily. Now on Novolog given recently on prednisone...on ssi adjusted by Dr. Sharl Ma. Fasting Blood sugars 141, 2 hours postprandial..some  401  Wound from debridment of cellulitis: Dr. Reesa Chew (surgeon)... Had appt for wound check 8/10...healing very well. No follow up scheduled.  HTN, well controlled on metoprolol.  Depression..some agitation later in day...better control back on fluoxetine, Has not nee clonazepam intermittantly for agitiation. Insomnia well controlled with ambien.  Anemia in hospital following suregery...stable last check on ferrous sulfate.  Off Chol meds currently.   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) 3)  Cough  (ICD-786.2) 4)  Wheezing  (ICD-786.07) 5)  Sore Throat  (ICD-462) 6)  Renal Insufficiency  (ICD-588.9) 7)  Unspecified Anemia  (ICD-285.9) 8)  Allergic Rhinitis Cause Unspecified  (ICD-477.9) 9)  Asthma, Intrinsic, With Acute Exacerbation  (ICD-493.12) 10)  Chest Xray, Abnormal  (ICD-793.1) 11)  Special Screening Malignant Neoplasm of Prostate  (ICD-V76.44) 12)  Hydronephrosis, Right  (ICD-591) 13)  Encounter For Long-term Use of Other Medications  (ICD-V58.69) 14)  Foot Surgery, Hx of  (ICD-V15.89) 15)  Knee Replacement, Left, Hx of  (ICD-V43.65) 16)  Encounter For Therapeutic Drug Monitoring  (ICD-V58.83) 17)  Aftercare, Long-term Use, Anticoagulants  (ICD-V58.61) 18)  Herpes Zoster W/nervous Complication Nec  (ICD-053.19) 19)  Diverticulosis, Colon  (ICD-562.10) 20)  Pulmonary Embolism, Dvt  (ICD-415.19) 21)  Restless Leg Syndrome  (ICD-333.94) 22)  Obesity, Bmi 35  (ICD-278.00) 23)  Irritable Bowel Syndrome  (ICD-564.1) 24)  Gastroparesis  (  ICD-536.3) 25)  Hypertension  (ICD-401.9) 26)  Low Back Pain, Chronic  (ICD-724.2) 27)  Depression  (ICD-311) 28)  Anxiety  (ICD-300.00) 29)  Asthma, Persistent, Mild  (ICD-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 Once Daily 2)  Omeprazole 40 Mg Cpdr (Omeprazole) .[name].. 1 Tab By Mouth Two Times A Day 3)  Nephrocaps 1 Mg Caps (B Complex-C-Folic Acid) .... One Tablet Daily 4)  Levemir 100 Unit/ml Soln (Insulin Detemir) .[name].. 14 Units in Am Once A Day 5)  Ferrous Sulfate 325 (65 Fe) Mg Tabs (Ferrous Sulfate) .... Take One Tablet 2 Times A Day 6)  Metoprolol Succinate 25 Mg Xr24h-Tab (Metoprolol Succinate) .... One Tablet By Mouth 2 Times Daily 7)  Clonazepam 0.5 Mg Tabs (Clonazepam) .[name].. 1 Trab By Mouth Every 8 Hours As Needed For Agitation 8)  Fluconazole 150 Mg Tabs (Fluconazole) .[name].. 1 Tab By Mouth X 7 Days 9)  Novolog 100 Unit/ml Soln (Insulin Aspart) .... 6 Units With Every Meal +scale 10)  Augmentin 875-125 Mg Tabs (Amoxicillin-Pot Clavulanate) .... Take 2 Tablets Daily 11)  Prednisone (Pak) 10 Mg Tabs (Prednisone) .... For 5 Days 12)  Hydrocodone-Acetaminophen 10-325 Mg Tabs (Hydrocodone-Acetaminophen) .[name].. 1-2 Every 4-6 Hours As Needed  Allergies: 1)  ! Sulfacetamide Sodium (Sulfacetamide Sodium)  Past History:  Past medical, surgical, family and social histories (including risk factors) reviewed, and no changes noted (except as noted below).  Past Medical History: Reviewed history from 09/02/2009 and no changes required. ICU stay, sepsis,  strep viridans. Status post renal failure, liver failure, tracheostomy, with recovery. 07/2009 HERPES ZOSTER W/NERVOUS COMPLICATION NEC (ICD-053.19) DIVERTICULOSIS, COLON (ICD-562.10) PULMONARY EMBOLISM, DVT (ICD-415.19) RESTLESS LEG SYNDROME (ICD-333.94) OBESITY, BMI 35 (ICD-278.00) IRRITABLE BOWEL SYNDROME (ICD-564.1) GASTROPARESIS (ICD-536.3) HYPERTENSION (ICD-401.9) LOW BACK PAIN, CHRONIC (ICD-724.2) DEPRESSION (ICD-311) ANXIETY (ICD-300.00) ASTHMA, INTERMITTENT, MILD (ICD-493.90) GERD, ESOPHAGITIS (ICD-530.81) ATRIAL FIBRILLATION, PAROXYSMAL (ICD-427.31) HYPERCHOLESTEROLEMIA  (ICD-272.0) DM (ICD-250.00)    Past Surgical History: Reviewed history from 05/23/2008 and no changes required. 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 Father: DIED 57 + MI, EMPHYSEMA Mother: DIED 47, PNA, ? UTERINE CA, METASTATIC Siblings: 4 BROTHERS, MI AGE 31, PROSTATE CA 1 SISTER ALZHEIMER'S, PNA NO MI < 55  Social History: Reviewed history from 05/23/2008 and no changes required. Marital Status: Married X 25 YEARS Children: 1 DAUGHTER, HEALTHY Occupation: RETIRED TRUCK DRIVER EXERCISE:  WALKS TREADMILL 30 MINUTES DIET:  3 MEALS, F& V, SOME WATER, CRYSTAL LIGHT, NO FAST FOOd  Review of Systems General:  Denies fatigue and fever. CV:  Denies chest pain or discomfort. Resp:  Denies shortness of breath. GI:  Denies abdominal pain. GU:  Denies dysuria.  Physical Exam  General:  Overweight appearing male inNAD Eyes:  No corneal or conjunctival inflammation noted. EOMI. Perrla. Funduscopic exam benign, without hemorrhages, exudates or papilledema. Vision grossly normal. Ears:  External ear exam shows no significant lesions or deformities.  Otoscopic examination reveals clear canals, tympanic membranes are intact bilaterally without bulging, retraction, inflammation or discharge. Hearing is grossly normal bilaterally. Nose:  External nasal examination shows no deformity or inflammation. Nasal mucosa are pink and moist without lesions or exudates. Mouth:  Oral  mucosa and oropharynx without lesions or exudates.  Dentures Neck:  no carotid bruit or thyromegaly no cervical or supraclavicular lymphadenopathy  Lungs:  Normal respiratory effort, chest expands symmetrically. Lungs are clear to auscultation, no crackles or wheezes. Heart:  Normal rate and regular rhythm. S1  and S2 normal without gallop, murmur, click, rub or other extra sounds. Abdomen:  Bowel sounds positive,abdomen soft and non-tender without masses, organomegaly or hernias noted. Pulses:  R and L posterior tibial pulses are full and equal bilaterally  Extremities:  no edema  Skin:  left groin wound, healing well Psych:  Cognition and judgment appear intact. Alert and cooperative with normal attention span and concentration. 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  Problem # 2:  DM (ICD-250.00) Poor control..but on levemir and humalog adjusted by ENDO.  His updated medication list for this problem includes:    Levemir 100 Unit/ml Soln (Insulin detemir) .[name]... 14 units in am once a day    Novolog 100 Unit/ml Soln (Insulin aspart) .Marland KitchenMarland KitchenMarland KitchenMarland Kitchen 6 units with every meal +scale  Problem # 3:  HYPERTENSION (ICD-401.9) Well controlled. Continue current medication.  His updated medication list for this problem includes:    Metoprolol Succinate 25 Mg Xr24h-tab (Metoprolol succinate) ..... One tablet by mouth 2 times daily  Problem # 4:  UNSPECIFIED ANEMIA (ICD-285.9) Stable on iron...recheck at next lab check.  His updated medication list for this problem includes:    Ferrous Sulfate 325 (65 Fe) Mg Tabs (Ferrous sulfate) .[name]... Take one tablet 2 times a day  Problem # 5:  ASTHMA, INTRINSIC, WITH ACUTE EXACERBATION (ICD-493.12) Improved.  His updated medication list for this problem includes:    Prednisone (pak) 10 Mg Tabs (Prednisone) .[name]... For 5 days  Problem # 6:  RENAL INSUFFICIENCY (ICD-588.9) Improved.   Problem # 7:  ANXIETY (ICD-300.00) Moderate control. He is interested in restarting clorazepate..used intermiattntly. We will try clonazepam first as this is shorter acting  and he already has med at home. [name]  Hopefully as he feels better he will be able to use only SSRI.  His updated medication list for this problem includes:    Fluoxetine Hcl 40 Mg Caps (Fluoxetine hcl) ..... Once daily    Clonazepam 0.5 Mg Tabs (Clonazepam) .[name]... 1 trab by mouth every 8 hours as needed for agitation  Problem # 8:  HYPERCHOLESTEROLEMIA (ICD-272.0) Will check chol and add back chol meds as needed.   Complete Medication List: 1)  Fluoxetine Hcl 40 Mg Caps (Fluoxetine hcl) .... Once daily 2)  Omeprazole 40 Mg Cpdr (Omeprazole) .[name].. 1 tab by mouth two times a day 3)  Nephrocaps 1 Mg Caps (B complex-c-folic acid) .... One tablet daily 4)  Levemir 100 Unit/ml Soln (Insulin detemir) .[name].. 14 units in am once a day 5)  Ferrous Sulfate 325 (65 Fe) Mg Tabs (Ferrous sulfate) .... Take one tablet 2 times a day 6)  Metoprolol Succinate 25 Mg Xr24h-tab (Metoprolol succinate) .... One tablet by mouth 2 times daily 7)  Clonazepam 0.5 Mg Tabs (Clonazepam) .[name].. 1 trab by mouth every 8 hours as needed for agitation 8)  Fluconazole 150 Mg Tabs (Fluconazole) .[name].. 1 tab by mouth x 7 days 9)  Novolog 100 Unit/ml Soln (Insulin aspart) .... 6 units with every meal +scale 10)  Augmentin 875-125 Mg Tabs (Amoxicillin-pot clavulanate) .Marland KitchenMarland KitchenMarland Kitchen  Take 2 tablets daily 11)  Prednisone (pak) 10 Mg Tabs (Prednisone) .... For 5 days 12)  Hydrocodone-acetaminophen 10-325 Mg Tabs (Hydrocodone-acetaminophen) .[name].. 1-2 every 4-6 hours as needed max acetominophen is 4000 mg daily  Patient Instructions: 1)  Fasting lipids, CMET, Cbc  Dx 272.0 ... in next 1-2  months. 2)  Please schedule a follow-up appointment in 2 months 30 min. Prescriptions: HYDROCODONE-ACETAMINOPHEN 10-325 MG TABS (HYDROCODONE-ACETAMINOPHEN) 1-2 every 4-6 hours as needed MAX acetominophen is 4000 mg daily  #100 x 0   Entered and Authorized by:   [name] MD   Signed by:   [name] MD on 09/23/2009   Method used:   Print then Give to Patient    RxID:   [id number] HYDROCODONE-ACETAMINOPHEN 10-325 MG TABS (HYDROCODONE-ACETAMINOPHEN) 1-2 every 4-6 hours as needed MAX acetominophen is 4000 mg daily  #100 x 0   Entered and Authorized by:   [name] MD   Signed by:   [name] MD on 09/23/2009   Method used:   Print then Give to Patient   RxID:   [id number]   Current Allergies (reviewed today): ! SULFACETAMIDE SODIUM (SULFACETAMIDE SODIUM)

## 2010-03-11 NOTE — Progress Notes (Signed)
Summary: refill request for clonazepam  Phone Note Refill Request Message from:  Fax from Pharmacy  Refills Requested: Medication #1:  CLONAZEPAM 0.5 MG TABS 1 trab by mouth every 8 hours as needed for agitation   Last Refilled: 08/28/2009 [facility]  [address], [phone].  Initial call taken by: Lowella Petties CMA,  September 17, 2009 4:32 PM  Follow-up for Phone Call        [facility] [place] Follow-up by: Benny Lennert CMA Duncan Dull),  September 18, 2009 8:15 AM    Prescriptions: CLONAZEPAM 0.5 MG TABS (CLONAZEPAM) 1 trab by mouth every 8 hours as needed for agitation  #30 x 0   Entered and Authorized by:   [name] MD   Signed by:   [name] MD on 09/17/2009   Method used:   Telephoned to ...       [facility]* (mail-order)       [address]       [place], [place]  [address]       Ph: [phone]       Fax: [phone]   RxID:   [id number]

## 2010-03-11 NOTE — Progress Notes (Signed)
Summary: FYI  Phone Note Call from Patient   Caller: Patient Call For: Jonathan Nora MD Summary of Call: Pt was here for protime and labs (A1C) he is concerned b/c his blood sugar is still elevated 2 weeks after finishing prednisone. He says it has always been stable averaging 105 fasting but since pred it has been around 215 fasting.  Pt just wanted to make you aware. Initial call taken by: Liane Comber CMA Duncan Dull),  May 17, 2009 9:46 AM  Follow-up for Phone Call        Will likely need to increase meds..will await A1C result.  Follow-up by: Jonathan Nora MD,  May 17, 2009 11:31 AM     Appended Document: FYI Patient advised.Consuello Masse CMA

## 2010-03-11 NOTE — Letter (Signed)
Summary: [facility] Office Note   [facility] Office Note   Imported By: Roderic Ovens 09/26/2009 16:12:59  _____________________________________________________________________  External Attachment:    Type:   Image     Comment:   External Document

## 2010-03-11 NOTE — Miscellaneous (Signed)
Summary: med list update- ambien  Clinical Lists Changes  Medications: Added new medication of AMBIEN 5 MG TABS (ZOLPIDEM TARTRATE) take 1-2 by mouth at bedtime as needed for insomnia     Prior Medications: FLUOXETINE HCL 40 MG CAPS (FLUOXETINE HCL) once daily OMEPRAZOLE 40 MG CPDR (OMEPRAZOLE) 1 tab by mouth two times a day NEPHROCAPS 1 MG CAPS (B COMPLEX-C-FOLIC ACID) one tablet daily LEVEMIR 100 UNIT/ML SOLN (INSULIN DETEMIR) 14 units in am once a day FERROUS SULFATE 325 (65 FE) MG TABS (FERROUS SULFATE) take one tablet 2 times a day METOPROLOL SUCCINATE 25 MG XR24H-TAB (METOPROLOL SUCCINATE) one tablet by mouth 2 times daily CLONAZEPAM 0.5 MG TABS (CLONAZEPAM) 1 trab by mouth every 8 hours as needed for agitation NOVOLOG 100 UNIT/ML SOLN (INSULIN ASPART) 6 units with every meal +scale HYDROCODONE-ACETAMINOPHEN 10-325 MG TABS (HYDROCODONE-ACETAMINOPHEN) 1-2 every 4-6 hours as needed MAX acetominophen is 4000 mg daily PREDNISONE 20 MG TABS (PREDNISONE) 2 tabs by mouth x 5 days, then 1 tab by mouth x 3 days AVELOX 400 MG  TABS (MOXIFLOXACIN HCL) 1 tablet by mouth daily AMBIEN 5 MG TABS (ZOLPIDEM TARTRATE) take 1-2 by mouth at bedtime as needed for insomnia Current Allergies: ! SULFACETAMIDE SODIUM (SULFACETAMIDE SODIUM)

## 2010-03-11 NOTE — Progress Notes (Signed)
Summary: form for diabetic supplies  Phone Note From Pharmacy   Caller: [facility] Summary of Call: Form for diabetic supplies is on your desk.  This was done in january but they are saying there was an error made that medicare wouldnt accept- maybe where one time a day had been checked and then crossed out without being initialed. Initial call taken by: Lowella Petties CMA,  April 19, 2009 9:37 AM

## 2010-03-11 NOTE — Letter (Signed)
Summary: [facility]  [facility]   Imported By: Lester Fairview 09/26/2009 10:42:58  _____________________________________________________________________  External Attachment:    Type:   Image     Comment:   External Document

## 2010-03-11 NOTE — Progress Notes (Signed)
Summary: wants phone call  Phone Note Call from Patient Call back at [phone]   Caller: Patient Call For: Dr. Patsy Lager  Summary of Call: Patient's wife called requesting a phone call  about her husband's offie visit this morning. She is really concerned and wants to talk with you about getting her husband a referral.  Initial call taken by: Melody Comas,  October 07, 2009 3:06 PM  Follow-up for Phone Call        can you find out what this is about? Follow-up by: Hannah Beat MD,  October 07, 2009 3:09 PM  Additional Follow-up for Phone Call Additional follow up Details #1::        Spoke with patients wife and she said that she is just concerned that patient needs to be on antibiotics and prednisone. She says that she is just concerned because nothing is working. She wants to know if this is something that will pass or do you think they need to see a specialist about him staying sick all the time Additional Follow-up by: Benny Lennert CMA Duncan Dull),  October 07, 2009 3:17 PM    Additional Follow-up for Phone Call Additional follow up Details #2::    I would like to get Dr. Ermalene Searing involved in this conversation, since she knows him far better and is managing PCP.   post-ICU admission with critical illness, septic shock, liver failure, renal failure, tracheostomy --- I would never expect anyone to be back to baseline at this point.   It looks like he has had multiple specialists involved recently including Pulmonology and 2 ENT's.  Before I would do anything, I have to get Dr. Daphine Deutscher input. She is in town and will be able to give feedback shortly. Follow-up by: Hannah Beat MD,  October 07, 2009 3:29 PM  Additional Follow-up for Phone Call Additional follow up Details #3:: Details for Additional Follow-up Action Taken: he was just started on antibiotics and prednsione this morning..correct?  i would continue these at least for a few days to determine if they will improve his  current SOb, wheezing etc. If his is minimally better tommorow..I will call to discuss with Pulm.   Patient's wife notified as instructed by telephone. Was informed by patient's wife that he did start the medication this morning. Ms. Trompeter will call back tomorrow to update Dr. Ermalene Searing on how the  patient is doing. Sydell Axon LPN  October 07, 2009 5:34 PM  Additional Follow-up by: [name] MD,  October 07, 2009 4:32 PM

## 2010-03-11 NOTE — Progress Notes (Signed)
Summary: pt is having spine surgery  Phone Note From Other Clinic   Caller: Patient Caller: Carla with Dr. Marshell Levan office [phone] Summary of Call: Pt is scheduled for kyphoplasty surgery on 12/15.  He will need to stop his coumadin prior to surgery, surgeon wants his level at 2 or less when he goes for surgery.  He wants pt to have his coumadin checked after being off of it and prior to surgery.  When shoould he go off of it and when should he have his level checked? Initial call taken by: Lowella Petties CMA, AAMA,  January 13, 2010 3:41 PM  Follow-up for Phone Call        Defer to AEB, here in AM. Follow-up by: Hannah Beat MD,  January 13, 2010 5:21 PM  Additional Follow-up for Phone Call Additional follow up Details #1::        Stop coumadin 5 days ahead of surgery and come for PT INR 1 day prior to surgery.  Additional Follow-up by: [name] MD,  January 14, 2010 8:16 AM    Additional Follow-up for Phone Call Additional follow up Details #2::    Patient advised.Consuello Masse CMA   Follow-up by: Benny Lennert CMA Duncan Dull),  January 14, 2010 8:35 AM

## 2010-03-11 NOTE — Medication Information (Signed)
Summary: [facility]  [facility]   Imported By: Lanelle Bal 04/19/2009 13:51:28  _____________________________________________________________________  External Attachment:    Type:   Image     Comment:   External Document

## 2010-03-11 NOTE — Assessment & Plan Note (Signed)
Summary: URI SXS   Vital Signs:  Patient profile:   70 year old male Height:      74 inches Weight:      257.8 pounds BMI:     33.22 O2 Sat:      99 % on Room air Temp:     98.1 degrees F oral Pulse rate:   85 / minute Pulse rhythm:   regular BP sitting:   130 / 80  (left arm) Cuff size:   regular  Vitals Entered By: Benny Lennert CMA (AAMA) (December 10, 2009 10:59 AM)  O2 Flow:  Room air  History of Present Illness: Chief complaint ? URI   In last 2-3 days sore throat, cough, productive cough... no change in color of sputum.  Using mucinex two times a day. No fever. No wheeze, No SOB.  No ear pain.  No face pain.  No chest pain  Seeing pulmonologist...has methocholine chanllage set up for tommorow AM.  PFTs and CX last month looked pretyy Nml.  Last course antibitoics was for urinary issue..Cipro.  Last Zpack 10/2009 for bronchitis.  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) 2)  Renal Insufficiency  (ICD-588.9) 3)  Unspecified Anemia  (ICD-285.9) 4)  Allergic Rhinitis Cause Unspecified  (ICD-477.9) 5)  Asthma, Intrinsic, With Acute Exacerbation  (ICD-493.12) 6)  Special Screening Malignant Neoplasm of Prostate  (ICD-V76.44) 7)  Hydronephrosis, Right  (ICD-591) 8)  Encounter For Long-term Use of Other Medications  (ICD-V58.69) 9)  Foot Surgery, Hx of  (ICD-V15.89) 10)  Knee Replacement, Left, Hx of  (ICD-V43.65) 11)  Encounter For Therapeutic Drug Monitoring  (ICD-V58.83) 12)  Aftercare, Long-term Use, Anticoagulants  (ICD-V58.61) 13)  Herpes Zoster W/nervous Complication Nec  (ICD-053.19) 14)  Diverticulosis, Colon  (ICD-562.10) 15)  Pulmonary Embolism, Dvt  (ICD-415.19) 16)  Restless Leg Syndrome  (ICD-333.94) 17)  Obesity, Bmi 35  (ICD-278.00) 18)  Irritable Bowel Syndrome  (ICD-564.1) 19)  Gastroparesis  (ICD-536.3) 20)  Hypertension  (ICD-401.9) 21)  Low Back Pain, Chronic  (ICD-724.2) 22)  Depression  (ICD-311) 23)   Anxiety  (ICD-300.00) 24)  Asthma, Persistent, Mild  (ICD-493.90) 25)  Gerd, Esophagitis  (ICD-530.81) 26)  Atrial Fibrillation, Paroxysmal  (ICD-427.31) 27)  Hypercholesterolemia  (ICD-272.0) 28)  Dm  (ICD-250.00)  Current Medications (verified): 1)  Fluoxetine Hcl 40 Mg Caps (Fluoxetine Hcl) .... Once Daily 2)  Omeprazole 40 Mg Cpdr (Omeprazole) .[name].. 1 Tab By Mouth Two Times A Day 3)  Nephrocaps 1 Mg Caps (B Complex-C-Folic Acid) .... One Tablet Daily 4)  Levemir 100 Unit/ml Soln (Insulin Detemir) .[name].. 18 Units in Am Once A Day 5)  Ferrous Sulfate 325 (65 Fe) Mg Tabs (Ferrous Sulfate) .... Take One Tablet 2 Times A Day 6)  Metoprolol Succinate 25 Mg Xr24h-Tab (Metoprolol Succinate) .... One Tablet By Mouth 2 Times Daily 7)  Clonazepam 0.5 Mg Tabs (Clonazepam) .[name].. 1 Trab By Mouth Every 8 Hours As Needed For Agitation 8)  Novolog 100 Unit/ml Soln (Insulin Aspart) .... 6 Units With Every Meal + Slinding Scale 9)  Hydrocodone-Acetaminophen 10-325 Mg Tabs (Hydrocodone-Acetaminophen) .[name].. 1-2 Every 4-6 Hours As Needed Max Acetominophen Is 4000 Mg Daily 10)  Ambien 5 Mg Tabs (Zolpidem Tartrate) .... Take 1-2 By Mouth At Bedtime As Needed For Insomnia 11)  Coumadin 2.5 Mg Tabs (Warfarin Sodium) .... As Directed 12)  Alendronate Sodium 70 Mg Tabs (Alendronate Sodium) .[name].. 1 Tab By Mouth Weekly  Allergies: 1)  ! Sulfacetamide Sodium (Sulfacetamide Sodium)  Past History:  Past medical, surgical, family and social histories (including risk factors) reviewed, and no changes noted (except as noted below).  Past Medical History: Reviewed history from 09/02/2009 and no changes required. ICU stay, sepsis,  strep viridans. Status post renal failure, liver failure, tracheostomy, with recovery. 07/2009 HERPES ZOSTER W/NERVOUS COMPLICATION NEC (ICD-053.19) DIVERTICULOSIS, COLON (ICD-562.10) PULMONARY EMBOLISM, DVT (ICD-415.19) RESTLESS LEG SYNDROME (ICD-333.94) OBESITY, BMI 35 (ICD-278.00) IRRITABLE  BOWEL SYNDROME (ICD-564.1) GASTROPARESIS (ICD-536.3) HYPERTENSION (ICD-401.9) LOW BACK PAIN, CHRONIC (ICD-724.2) DEPRESSION (ICD-311) ANXIETY (ICD-300.00) ASTHMA, INTERMITTENT, MILD (ICD-493.90) GERD, ESOPHAGITIS (ICD-530.81) ATRIAL FIBRILLATION, PAROXYSMAL (ICD-427.31) HYPERCHOLESTEROLEMIA (ICD-272.0) DM (ICD-250.00)    Past Surgical History: Reviewed history from 05/23/2008 and no changes required. 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 Father: DIED 18 + MI, EMPHYSEMA Mother: DIED 49, PNA, ? UTERINE CA, METASTATIC Siblings: 4 BROTHERS, MI AGE 43, PROSTATE CA 1 SISTER ALZHEIMER'S, PNA NO MI < 55  Social History: Reviewed history from 11/07/2009 and no changes required. Marital Status: Married X 25 YEARS Children: 1 DAUGHTER, HEALTHY Occupation: RETIRED TRUCK DRIVER EXERCISE:  WALKS TREADMILL 30 MINUTES DIET:  3 MEALS, F& V, SOME WATER, CRYSTAL LIGHT, NO FAST FOOd Patient never smoked.   Review of Systems General:  Denies fatigue and fever. CV:  Denies chest pain or discomfort. Resp:  Denies shortness of breath. GI:  Denies abdominal pain. GU:  Denies dysuria.   Impression & Recommendations:  Problem # 1:  URI (ICD-465.9) Most likely viral source. Will given antibitoics to hold on to .[name]..start if not turning the corner in 3-5 days given complicated history..current pulm eval ongoing.  No sign of current wheezing.  Complete Medication List: 1)  Fluoxetine Hcl 40 Mg Caps (Fluoxetine hcl) .... Once daily 2)  Omeprazole 40 Mg Cpdr (Omeprazole) .[name].. 1 tab by mouth two times a day 3)  Nephrocaps 1 Mg Caps (B complex-c-folic acid) .... One tablet daily 4)  Levemir 100 Unit/ml Soln (Insulin detemir) .[name].. 18 units in am once a day 5)  Ferrous Sulfate 325 (65  Fe) Mg Tabs (Ferrous sulfate) .... Take one tablet 2 times a day 6)  Metoprolol Succinate 25 Mg Xr24h-tab (Metoprolol succinate) .... One tablet by mouth 2 times daily 7)  Clonazepam 0.5 Mg Tabs (Clonazepam) .[name].. 1 trab by mouth every 8 hours as needed for agitation 8)  Novolog 100 Unit/ml Soln (Insulin aspart) .... 6 units with every meal + slinding scale 9)  Hydrocodone-acetaminophen 10-325 Mg Tabs (Hydrocodone-acetaminophen) .[name].. 1-2 every 4-6 hours as needed max acetominophen is 4000 mg daily 10)  Ambien 5 Mg Tabs (Zolpidem tartrate) .... Take 1-2 by mouth at bedtime as needed for insomnia 11)  Coumadin 2.5 Mg Tabs (Warfarin sodium) .... As directed 12)  Alendronate Sodium 70 Mg Tabs (Alendronate sodium) .[name].. 1 tab by mouth weekly 13)  Azithromycin 250 Mg Tabs (Azithromycin) .... 2 tab by mouth x 1, 1 tab by mouth daily  Patient Instructions: 1)  Nasal saline 3-4 times a day. 2)  Continue mucinex two times a day. 3)  Will given prescrpiton for antibiotic...so if not improving in next few days may fill. 4)   Call if wheeze,SOB.  Prescriptions: AZITHROMYCIN 250 MG TABS (AZITHROMYCIN) 2 tab by mouth x 1, 1 tab by mouth daily  #6 x 0   Entered and Authorized by:  [name] MD   Signed by:   [name] MD on 12/10/2009   Method used:   Print then Give to Patient   RxID:   [id number]    Orders Added: 1)  Est. Patient Level III [62952]    Current Allergies (reviewed today): ! SULFACETAMIDE SODIUM (SULFACETAMIDE SODIUM)

## 2010-03-11 NOTE — Assessment & Plan Note (Signed)
Summary: ROA 2 MTHS CYD   Vital Signs:  Patient profile:   70 year old male Height:      74 inches Weight:      255.2 pounds BMI:     32.88 Temp:     97.8 degrees F oral Pulse rate:   80 / minute Pulse rhythm:   regular BP sitting:   120 / 70  (left arm) Cuff size:   large  Vitals Entered By: Benny Lennert CMA [facility]) (November 26, 2009 9:48 AM)  History of Present Illness: Chief complaint 2 month follow up    SOB..CXR nml..has PFT... but results no back was done 10/6. Stable..no current active infection. Using albuterol prn  but has not used in last 4-5 days.  [facility] since [facility]..seen Dr. Jerl Santos.[name] BAck films showed compression fracture. Given percocet for pain. If not better in 2 weeks..palns khyphoplasty. Likely osteoporosis...never had DXA or med for this in past.   DM, seeing Endocrinologist. On levemir 26 Units , using novolog 9-12 Units three times a day preprandial blood sugars 130-140  Fasting CBG ..has not been checking.   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) 6)  Renal Insufficiency  (ICD-588.9) 7)  Unspecified Anemia  (ICD-285.9) 8)  Allergic Rhinitis Cause Unspecified  (ICD-477.9) 9)  Asthma, Intrinsic, With Acute Exacerbation  (ICD-493.12) 10)  Chest Xray, Abnormal  (ICD-793.1) 11)  Special Screening Malignant Neoplasm of Prostate  (ICD-V76.44) 12)  Hydronephrosis, Right  (ICD-591) 13)  Encounter For Long-term Use of Other Medications  (ICD-V58.69) 14)  Foot Surgery, Hx of  (ICD-V15.89) 15)  Knee Replacement, Left, Hx of  (ICD-V43.65) 16)  Encounter For Therapeutic Drug Monitoring  (ICD-V58.83) 17)  Aftercare, Long-term Use, Anticoagulants  (ICD-V58.61) 18)  Herpes Zoster W/nervous Complication Nec   (ICD-053.19) 19)  Diverticulosis, Colon  (ICD-562.10) 20)  Pulmonary Embolism, Dvt  (ICD-415.19) 21)  Restless Leg Syndrome  (ICD-333.94) 22)  Obesity, Bmi 35  (ICD-278.00) 23)  Irritable Bowel Syndrome  (ICD-564.1) 24)  Gastroparesis  (ICD-536.3) 25)  Hypertension  (ICD-401.9) 26)  Low Back Pain, Chronic  (ICD-724.2) 27)  Depression  (ICD-311) 28)  Anxiety  (ICD-300.00) 29)  Asthma, Persistent, Mild  (ICD-493.90) 30)  Gerd, Esophagitis  (ICD-530.81) 31)  Atrial Fibrillation, Paroxysmal  (ICD-427.31) 32)  Hypercholesterolemia  (ICD-272.0) 33)  Dm  (ICD-250.00)  Current Medications (verified): 1)  Fluoxetine Hcl 40 Mg Caps (Fluoxetine Hcl) .... Once Daily 2)  Omeprazole 40 Mg Cpdr (Omeprazole) .[name].. 1 Tab By Mouth Two Times A Day 3)  Nephrocaps 1 Mg Caps (B Complex-C-Folic Acid) .... One Tablet Daily 4)  Levemir 100 Unit/ml Soln (Insulin Detemir) .[name].. 18 Units in Am Once A Day 5)  Ferrous Sulfate 325 (65 Fe) Mg Tabs (Ferrous Sulfate) .... Take One Tablet 2 Times A Day 6)  Metoprolol Succinate 25 Mg Xr24h-Tab (Metoprolol Succinate) .... One Tablet By Mouth 2 Times Daily 7)  Clonazepam 0.5 Mg Tabs (Clonazepam) .[name].. 1 Trab By Mouth Every 8 Hours As Needed For Agitation 8)  Novolog 100 Unit/ml Soln (Insulin Aspart) .... 6 Units With Every Meal + Slinding Scale 9)  Hydrocodone-Acetaminophen 10-325 Mg Tabs (Hydrocodone-Acetaminophen) .[name].. 1-2 Every 4-6 Hours As Needed Max Acetominophen Is 4000 Mg Daily 10)  Ambien 5 Mg Tabs (Zolpidem Tartrate) .... Take 1-2 By  Mouth At Bedtime As Needed For Insomnia 11)  Coumadin 2.5 Mg Tabs (Warfarin Sodium) .... As Directed 12)  Ciprofloxacin Hcl 500 Mg Tabs (Ciprofloxacin Hcl) .... Take 1 Tablet By Mouth Every 12 Hours X 10 Days  Allergies: 1)  ! Sulfacetamide Sodium (Sulfacetamide Sodium)  Past History:  Past medical, surgical, family and social histories (including risk factors) reviewed, and no changes noted (except as noted below).  Past Medical  History: Reviewed history from 09/02/2009 and no changes required. ICU stay, sepsis,  strep viridans. Status post renal failure, liver failure, tracheostomy, with recovery. 07/2009 HERPES ZOSTER W/NERVOUS COMPLICATION NEC (ICD-053.19) DIVERTICULOSIS, COLON (ICD-562.10) PULMONARY EMBOLISM, DVT (ICD-415.19) RESTLESS LEG SYNDROME (ICD-333.94) OBESITY, BMI 35 (ICD-278.00) IRRITABLE BOWEL SYNDROME (ICD-564.1) GASTROPARESIS (ICD-536.3) HYPERTENSION (ICD-401.9) LOW BACK PAIN, CHRONIC (ICD-724.2) DEPRESSION (ICD-311) ANXIETY (ICD-300.00) ASTHMA, INTERMITTENT, MILD (ICD-493.90) GERD, ESOPHAGITIS (ICD-530.81) ATRIAL FIBRILLATION, PAROXYSMAL (ICD-427.31) HYPERCHOLESTEROLEMIA (ICD-272.0) DM (ICD-250.00)    Past Surgical History: Reviewed history from 05/23/2008 and no changes required. 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 Father: DIED 78 + MI, EMPHYSEMA Mother: DIED 31, PNA, ? UTERINE CA, METASTATIC Siblings: 4 BROTHERS, MI AGE 54, PROSTATE CA 1 SISTER ALZHEIMER'S, PNA NO MI < 55  Social History: Reviewed history from 11/07/2009 and no changes required. Marital Status: Married X 25 YEARS Children: 1 DAUGHTER, HEALTHY Occupation: RETIRED TRUCK DRIVER EXERCISE:  WALKS TREADMILL 30 MINUTES DIET:  3 MEALS, F& V, SOME WATER, CRYSTAL LIGHT, NO FAST FOOd Patient never smoked.   Physical Exam  General:  Overweight appearing male inNAD Ears:  hearing aides Nose:  External nasal examination shows no deformity or inflammation. Nasal mucosa are pink and moist without lesions or exudates. Mouth:  Oral mucosa and oropharynx without lesions or exudates.  Dentures MMM Neck:  no carotid bruit or thyromegaly no cervical or supraclavicular lymphadenopathy  Lungs:  Normal respiratory  effort, chest expands symmetrically. Lungs are clear to auscultation, no crackles or wheezes. Heart:  Normal rate and regular rhythm. S1 and S2 normal without gallop, murmur, click, rub or other extra sounds. Abdomen:  Bowel sounds positive,abdomen soft and non-tender without masses, organomegaly or hernias noted. Pulses:  R and L posterior tibial pulses are full and equal bilaterally  Extremities:  no edmea  Psych:  Cognition and judgment appear intact. Alert and cooperative with normal attention span and concentration. No apparent delusions, illusions, hallucinations  Diabetes Management Exam:    Foot Exam (with socks and/or shoes not present):       Sensory-Pinprick/Light touch:          Left medial foot (L-4): normal          Left dorsal foot (L-5): normal          Left lateral foot (S-1): normal          Right medial foot (L-4): normal          Right dorsal foot (L-5): normal          Right lateral foot (S-1): normal       Sensory-Monofilament:          Left foot: normal          Right foot: normal       Inspection:          Left foot: normal  Right foot: normal       Nails:          Left foot: normal          Right foot: normal   Impression & Recommendations:  Problem # 1:  HYPERTENSION (ICD-401.9) Well controlled. Continue current medication.  His updated medication list for this problem includes:    Metoprolol Succinate 25 Mg Xr24h-tab (Metoprolol succinate) ..... One tablet by mouth 2 times daily  Problem # 2:  DEPRESSION (ICD-311) Stable on current meds. His updated medication list for this problem includes:    Fluoxetine Hcl 40 Mg Caps (Fluoxetine hcl) ..... Once daily    Clonazepam 0.5 Mg Tabs (Clonazepam) .[name]... 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 Send for DXA to verify and get baseline. Start fosamax weekly.  His updated medication list for this problem  includes:    Alendronate Sodium 70 Mg Tabs (Alendronate sodium) .[name]... 1 tab by mouth weekly  Orders: Radiology Referral (Radiology)  Problem # 4:  DM (ICD-250.00) Improved control on insulin..per ENDO.  His updated medication list for this problem includes:    Levemir 100 Unit/ml Soln (Insulin detemir) .Marland KitchenMarland KitchenMarland KitchenMarland Kitchen 18 units in am once a day    Novolog 100 Unit/ml Soln (Insulin aspart) .Marland KitchenMarland KitchenMarland KitchenMarland Kitchen 6 units with every meal + slinding scale  Complete Medication List: 1)  Fluoxetine Hcl 40 Mg Caps (Fluoxetine hcl) .... Once daily 2)  Omeprazole 40 Mg Cpdr (Omeprazole) .[name].. 1 tab by mouth two times a day 3)  Nephrocaps 1 Mg Caps (B complex-c-folic acid) .... One tablet daily 4)  Levemir 100 Unit/ml Soln (Insulin detemir) .[name].. 18 units in am once a day 5)  Ferrous Sulfate 325 (65 Fe) Mg Tabs (Ferrous sulfate) .... Take one tablet 2 times a day 6)  Metoprolol Succinate 25 Mg Xr24h-tab (Metoprolol succinate) .... One tablet by mouth 2 times daily 7)  Clonazepam 0.5 Mg Tabs (Clonazepam) .[name].. 1 trab by mouth every 8 hours as needed for agitation 8)  Novolog 100 Unit/ml Soln (Insulin aspart) .... 6 units with every meal + slinding scale 9)  Hydrocodone-acetaminophen 10-325 Mg Tabs (Hydrocodone-acetaminophen) .[name].. 1-2 every 4-6 hours as needed max acetominophen is 4000 mg daily 10)  Ambien 5 Mg Tabs (Zolpidem tartrate) .... Take 1-2 by mouth at bedtime as needed for insomnia 11)  Coumadin 2.5 Mg Tabs (Warfarin sodium) .... As directed 12)  Ciprofloxacin Hcl 500 Mg Tabs (Ciprofloxacin hcl) .... Take 1 tablet by mouth every 12 hours x 10 days 13)  Alendronate Sodium 70 Mg Tabs (Alendronate sodium) .[name].. 1 tab by mouth weekly  Patient Instructions: 1)  Referral Appointment Information 2)  Day/Date: 3)  Time: 4)  Place/MD: 5)  Address: 6)  Phone/Fax: 7)  Patient given appointment information. Information/Orders faxed/mailed.  8)  Start generic fosamax for presumed osteoporosis.  9)  Please schedule a follow-up  appointment in 6 months  30 min.  Prescriptions: ALENDRONATE SODIUM 70 MG TABS (ALENDRONATE SODIUM) 1 tab by mouth weekly  #12 x 3   Entered and Authorized by:   [name] MD   Signed by:   [name] MD on 11/26/2009   Method used:   Electronically to        [facility] (mail-order)       [address]       [place], [place]  [address]       Ph:  [phone]       Fax: [phone]   RxID:   [id number]    Orders Added: 1)  Radiology Referral [Radiology] 2)  Est. Patient Level IV [40347]    Current Allergies (reviewed today): ! SULFACETAMIDE SODIUM (SULFACETAMIDE SODIUM)  Appended Document: ROA 2 MTHS CYD    Clinical Lists Changes  Orders: Added new Service order of [facility] [phone]) - Signed Observations: Added new observation of NEXT PT: 2 weeks (11/26/2009 11:29) Added new observation of COUM TAB MG: 2.5,g daily, 5mg  mon (11/26/2009 11:29) Added new observation of CUR. REGIMEN: 2.5,g daily, 5mg  mon (11/26/2009 11:29) Added new observation of PT PATIENT: 27.7 s (11/26/2009 11:29) Added new observation of ABNORM BLEED: No  (11/26/2009 11:29) Added new observation of COUMADIN CHG: No  (11/26/2009 11:29) Added new observation of INR: 2.3  (11/26/2009 11:29)       ANTICOAGULATION RECORD PREVIOUS REGIMEN & LAB RESULTS Anticoagulation Diagnosis:  pe/afib on  03/05/2009 Previous INR Goal Range:  2.5-3.5 on  03/05/2009 Previous INR:  2.2 on  11/18/2009 Previous Coumadin Dose(mg):  5mg  x 2 days only then 2.5,g daily on  11/18/2009 Previous Regimen:  2.5,g daily, 5mg  mon on  11/18/2009 Previous Coagulation Comments:  . on  01/09/2009  NEW REGIMEN & LAB RESULTS Current INR: 2.3 Current Coumadin Dose(mg): 2.5,g daily, 5mg  mon Regimen: 2.5,g daily, 5mg  mon  Provider: bedsole Repeat testing in: 2 weeks Dose has been reviewed with patient or caretaker during this visit. Reviewed by: Mills Koller  Anticoagulation Visit  Questionnaire Coumadin dose missed/changed:  No Abnormal Bleeding Symptoms:  No  Any diet changes including alcohol intake, vegetables or greens since the last visit:  No Any illnesses or hospitalizations since the last visit:  No Any signs of clotting since the last visit (including chest discomfort, dizziness, shortness of breath, arm tingling, slurred speech, swelling or redness in leg):  No  MEDICATIONS FLUOXETINE HCL 40 MG CAPS (FLUOXETINE HCL) once daily OMEPRAZOLE 40 MG CPDR (OMEPRAZOLE) 1 tab by mouth two times a day NEPHROCAPS 1 MG CAPS (B COMPLEX-C-FOLIC ACID) one tablet daily LEVEMIR 100 UNIT/ML SOLN (INSULIN DETEMIR) 18 units in am once a day FERROUS SULFATE 325 (65 FE) MG TABS (FERROUS SULFATE) take one tablet 2 times a day METOPROLOL SUCCINATE 25 MG XR24H-TAB (METOPROLOL SUCCINATE) one tablet by mouth 2 times daily CLONAZEPAM 0.5 MG TABS (CLONAZEPAM) 1 trab by mouth every 8 hours as needed for agitation NOVOLOG 100 UNIT/ML SOLN (INSULIN ASPART) 6 units with every meal + slinding scale HYDROCODONE-ACETAMINOPHEN 10-325 MG TABS (HYDROCODONE-ACETAMINOPHEN) 1-2 every 4-6 hours as needed MAX acetominophen is 4000 mg daily AMBIEN 5 MG TABS (ZOLPIDEM TARTRATE) take 1-2 by mouth at bedtime as needed for insomnia COUMADIN 2.5 MG TABS (WARFARIN SODIUM) as directed CIPROFLOXACIN HCL 500 MG TABS (CIPROFLOXACIN HCL) Take 1 tablet by mouth every 12 hours x 10 days ALENDRONATE SODIUM 70 MG TABS (ALENDRONATE SODIUM) 1 tab by mouth weekly    Laboratory Results   Blood Tests   Date/Time Received: November 26, 2009 11:29 AM Date/Time Reported: November 26, 2009 11:29 AM  PT: 27.7 s   (Normal Range: 10.6-13.4)  INR: 2.3   (Normal Range: 0.88-1.12   Therap INR: 2.0-3.5)    Laboratory Results   Blood Tests     PT: 27.7 s   (Normal Range: 10.6-13.4)  INR: 2.3   (Normal Range: 0.88-1.12   Therap INR: 2.0-3.5)      ANTICOAGULATION RECORD PREVIOUS REGIMEN & LAB  RESULTS Anticoagulation Diagnosis:  pe/afib on  03/05/2009 Previous INR Goal Range:  2.5-3.5 on  03/05/2009 Previous INR:  2.2 on  11/18/2009 Previous Coumadin Dose(mg):  5mg  x 2 days only then 2.5,g daily on  11/18/2009 Previous Regimen:  2.5,g daily, 5mg  mon on  11/18/2009 Previous Coagulation Comments:  . on  01/09/2009  NEW REGIMEN & LAB RESULTS Current INR: 2.3 Current Coumadin Dose(mg): 2.5,g daily, 5mg  mon Regimen: 2.5,g daily, 5mg  mon       Repeat testing in: 2 weeks MEDICATIONS FLUOXETINE HCL 40 MG CAPS (FLUOXETINE HCL) once daily OMEPRAZOLE 40 MG CPDR (OMEPRAZOLE) 1 tab by mouth two times a day NEPHROCAPS 1 MG CAPS (B COMPLEX-C-FOLIC ACID) one tablet daily LEVEMIR 100 UNIT/ML SOLN (INSULIN DETEMIR) 18 units in am once a day FERROUS SULFATE 325 (65 FE) MG TABS (FERROUS SULFATE) take one tablet 2 times a day METOPROLOL SUCCINATE 25 MG XR24H-TAB (METOPROLOL SUCCINATE) one tablet by mouth 2 times daily CLONAZEPAM 0.5 MG TABS (CLONAZEPAM) 1 trab by mouth every 8 hours as needed for agitation NOVOLOG 100 UNIT/ML SOLN (INSULIN ASPART) 6 units with every meal + slinding scale HYDROCODONE-ACETAMINOPHEN 10-325 MG TABS (HYDROCODONE-ACETAMINOPHEN) 1-2 every 4-6 hours as needed MAX acetominophen is 4000 mg daily AMBIEN 5 MG TABS (ZOLPIDEM TARTRATE) take 1-2 by mouth at bedtime as needed for insomnia COUMADIN 2.5 MG TABS (WARFARIN SODIUM) as directed CIPROFLOXACIN HCL 500 MG TABS (CIPROFLOXACIN HCL) Take 1 tablet by mouth every 12 hours x 10 days ALENDRONATE SODIUM 70 MG TABS (ALENDRONATE SODIUM) 1 tab by mouth weekly   Anticoagulation Visit Questionnaire      Coumadin dose missed/changed:  No      Abnormal Bleeding Symptoms:  No

## 2010-03-11 NOTE — Progress Notes (Signed)
Summary: Prior Authorization [employer]  Phone Note From Pharmacy Call back at fax [phone]   Caller: [facility] Call For: Dr. Ermalene Searing  Summary of Call: Received PA form for [employer], form in your IN box.   Initial call taken by: Linde Gillis CMA [facility]),  Jun 18, 2009 8:41 AM

## 2010-03-11 NOTE — Progress Notes (Signed)
Summary: verapamil is on back order  Phone Note From Pharmacy Call back at [facility] Phone [phone]   Caller: [facility] Summary of Call: Pt has been advised by [facility] that verapamil is on manufacturer backorder.  He will need to change to something else until medicine is available again.  Letter is on your desk. Initial call taken by: Lowella Petties CMA,  April 09, 2009 9:12 AM  Follow-up for Phone Call        Please make sure pt aware of replacement.  Follow-up by: [name] MD,  April 09, 2009 1:09 PM  Additional Follow-up for Phone Call Additional follow up Details #1::        Patient advised.Consuello Masse CMA  Additional Follow-up by: Benny Lennert CMA Duncan Dull),  April 10, 2009 10:39 AM    New/Updated Medications: CALAN SR 180 MG CR-TABS (VERAPAMIL HCL) Take 1 tablet by mouth once a day Prescriptions: CALAN SR 180 MG CR-TABS (VERAPAMIL HCL) Take 1 tablet by mouth once a day  #90 x 3   Entered and Authorized by:   [name] MD   Signed by:   [name] MD on 04/09/2009   Method used:   Electronically to        [facility] ([facility]-order)       [address]       [place], [place]  [address]       Ph: [phone]       Fax: [phone]   RxID:   [id number]

## 2010-03-11 NOTE — Letter (Signed)
Summary: [facility]  [facility]   Imported By: Lanelle Bal 12/17/2009 08:11:56  _____________________________________________________________________  External Attachment:    Type:   Image     Comment:   External Document

## 2010-03-11 NOTE — Progress Notes (Signed)
Summary: STILL SICK  Phone Note Call from Patient   Caller: Patient Call For: [name] MD Summary of Call: Patient was given a z-pack and finished that butis still not any better still has head and chest congestion but, no fever. Wants to know if you can call anything else for him. [facility] Initial call taken by: Benny Lennert CMA Duncan Dull),  December 19, 2009 8:40 AM  Follow-up for Phone Call        doxycycline 100 mg by mouth two times a day, #10  follow-up early next week for recheck with Dr. Ermalene Searing  I expect that the antibiotics he was on (zpak) altered his coumadin filtration in the liver and increased that INR even after stopping coumadin. this can also happen with doxycycline less commonly. Follow-up by: Hannah Beat MD,  December 19, 2009 10:32 AM  Additional Follow-up for Phone Call Additional follow up Details #1::        Patient advised via message on machine that rx called to pharmacy.Consuello Masse CMA   Additional Follow-up by: Benny Lennert CMA Duncan Dull),  December 19, 2009 10:35 AM    New/Updated Medications: DOXYCYCLINE HYCLATE 100 MG CAPS (DOXYCYCLINE HYCLATE) take one tablet twice daily Prescriptions: DOXYCYCLINE HYCLATE 100 MG CAPS (DOXYCYCLINE HYCLATE) take one tablet twice daily  #10 x 0   Entered by:   Benny Lennert CMA (AAMA)   Authorized by:   Hannah Beat MD   Signed by:   Benny Lennert CMA (AAMA) on 12/19/2009   Method used:   [facility]        [facility]ail)       [address]       [place], [place]  [address]       Ph: [phone] or [phone]       Fax: [phone]   RxID:   [id number]

## 2010-03-11 NOTE — Progress Notes (Signed)
Summary: regarding possible drug interactions  Phone Note From Pharmacy   Caller: [facility]* Summary of Call: Letter regarding possible drug interactions between warfarin and fluoxetine and warfarin and fenofibrate are on your desk. Initial call taken by: Lowella Petties CMA,  August 28, 2009 9:36 AM

## 2010-03-11 NOTE — Progress Notes (Signed)
Summary: PT/INR  Phone Note Call from Patient Call back at Home Phone [phone] Call back at Cell- [phone]   Caller: Patient Call For: [name] MD Summary of Call: Patient was scheduled for surgery tomorrow and was told to hold his coumadin 2 days prior to surgery. Patient had his PT checked today and his PT was 25.9 and his INR was 2.36. They need his INR to be less than 1.5 in order to do the surgery. They rescheduled surgery for next wed. Albin Felling is asking when to take his coumadin in the mean time to have his INR at 1.5 before next week. Please call patient's wife with instructions.  Initial call taken by: Melody Comas,  December 18, 2009 4:33 PM  Follow-up for Phone Call        This was higher than the 2.1 at last check on 11/1. Very surprising. I have no way of knowing exactly how his INR will respond to coming off coumadin..I can only make suggestion or trying to come of 5 days ahead of time this time..so take tomorow and day after then hold until Wed.  Follow-up by: [name] MD,  December 18, 2009 4:52 PM  Additional Follow-up for Phone Call Additional follow up Details #1::        Patients wife advised.Consuello Masse CMA   Additional Follow-up by: Benny Lennert CMA Duncan Dull),  December 18, 2009 5:00 PM

## 2010-03-11 NOTE — Progress Notes (Signed)
Summary: Wheezing, congestion, cough  Phone Note Call from Patient Call back at Home Phone [phone]   Caller: Patient Call For: [name] MD Reason for Call: Talk to Doctor Summary of Call: Patient is still wheezing and cough.  He is not sleeping at all.  He thinks the Prednisone is whats keeping him from sleeping.  He still has a lot of congestion and it has not cleared up like it usually does.  He says that Dr. Ermalene Searing told him to call back if he was no better by today.  I can hear him wheezing over the phone and his cough sounds raspy.  [facility] Initial call taken by: Linde Gillis CMA Duncan Dull),  Jun 14, 2009 8:55 AM  Follow-up for Phone Call        If minimal improvement as it sounds..should go to ER for IV steroids, possible admission !!!! This is likely best course.  If some improvement with steroid course...wcontinue prednsione at 60 mg daily (3 tabs of 20 mg) through weekend. Also he needs to be on and inhaled steroid as well, not sure why stopped in pasty...start advair 250/50. Continue antibiotics.  MAke follow up  appt on Monday.   Follow-up by: [name] MD,  Jun 14, 2009 9:19 AM  Additional Follow-up for Phone Call Additional follow up Details #1::        Advised patient as instructed, he says that he is feeling some better.  He does not feel bad enough or feel that he needs to go to the ER.  He uses a nebulizer(Albuterol).  He would rather just stay on the Prednisone through the weekend and follow up on Monday.  Scheduled him to see Dr. Patsy Lager on Monday at 10:45. Additional Follow-up by: Linde Gillis CMA Duncan Dull),  Jun 14, 2009 9:39 AM    Additional Follow-up for Phone Call Additional follow up Details #2::    Make sure he knows to stay on 3 tabs of prednisone..higher dose thru weekend. Call in extra tabs in needed. Also would recommend Advair. Follow-up by: [name] MD,  Jun 14, 2009 10:12 AM  Additional Follow-up for Phone Call Additional follow up  Details #3:: Details for Additional Follow-up Action Taken: Rx [facility].  Patient says that he already has the Advair inhaler as well.  Linde Gillis CMA Duncan Dull)  Jun 14, 2009 10:38 AM   New/Updated Medications: ADVAIR DISKUS 250-50 MCG/DOSE AEPB (FLUTICASONE-SALMETEROL) 1 puff two times a day PREDNISONE 20 MG TABS (PREDNISONE) take one tablet by mouth three times a day for three days Prescriptions: PREDNISONE 20 MG TABS (PREDNISONE) take one tablet by mouth three times a day for three days  #9 x 0   Entered by:   Linde Gillis CMA (AAMA)   Authorized by:   [name] MD   Signed by:   [name] MD on 06/14/2009   Method used:   [facility]        [facility] (retail)       [address]       [place], [place]  [address]       Ph: [phone] or [phone]       Fax: [phone]   RxID:   [id number] ADVAIR DISKUS 250-50 MCG/DOSE AEPB (FLUTICASONE-SALMETEROL) 1 puff two times a day  #1 x 11   Entered and Authorized by:   [name] MD   Signed by:   [name] MD on  06/14/2009   Method used:   Electronically to        [facility] ([facility]order)       [address]       [place], [place]  [address]       Ph: [phone]       Fax: [phone]   RxID:   [id number]   Current Allergies (reviewed today): ! SULFACETAMIDE SODIUM (SULFACETAMIDE SODIUM)

## 2010-03-11 NOTE — Assessment & Plan Note (Signed)
Summary: F/U MEDICATION/CLE   Vital Signs:  Patient profile:   70 year old male Height:      74 inches Weight:      259.9 pounds BMI:     33.49 Temp:     97.7 degrees F oral Pulse rate:   80 / minute Pulse rhythm:   regular BP sitting:   102 / 70  (left arm) Cuff size:   large  Vitals Entered By: Benny Lennert CMA Duncan Dull) (December 24, 2009 8:15 AM)  History of Present Illness: Chief complaint follow up medication  Acute bronchitis.Marland Kitchens/p Zpack..minimal improvement.  Now on day 5 of doxycycline. Was only given 10 tabs total.  He states modearteimprovement with this medicaiton... still lots of congestioncontinued productive cough, thick dark. No wheezing or rattling. No SOB. No fever.  Headache left face...cluster headaches.   No chest pain.  Using nebulizers twice a day more for mucus than anything else..not for wheezing.  Fatigue, difficulty sleeping. No anxiety, mood well controlled.  Using clonazepam at bedtime.      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) 7)  Special Screening Malignant Neoplasm of Prostate  (ICD-V76.44) 8)  Hydronephrosis, Right  (ICD-591) 9)  Encounter For Long-term Use of Other Medications  (ICD-V58.69) 10)  Foot Surgery, Hx of  (ICD-V15.89) 11)  Knee Replacement, Left, Hx of  (ICD-V43.65) 12)  Encounter For Therapeutic Drug Monitoring  (ICD-V58.83) 13)  Aftercare, Long-term Use, Anticoagulants  (ICD-V58.61) 14)  Herpes Zoster W/nervous Complication Nec  (ICD-053.19) 15)  Diverticulosis, Colon  (ICD-562.10) 16)  Pulmonary Embolism, Dvt  (ICD-415.19) 17)  Restless Leg Syndrome  (ICD-333.94) 18)  Obesity, Bmi 35  (ICD-278.00) 19)  Irritable Bowel Syndrome  (ICD-564.1) 20)  Gastroparesis  (ICD-536.3) 21)   Hypertension  (ICD-401.9) 22)  Low Back Pain, Chronic  (ICD-724.2) 23)  Depression  (ICD-311) 24)  Anxiety  (ICD-300.00) 25)  Asthma, Persistent, Mild  (ICD-493.90) 26)  Gerd, Esophagitis  (ICD-530.81) 27)  Atrial Fibrillation, Paroxysmal  (ICD-427.31) 28)  Hypercholesterolemia  (ICD-272.0) 29)  Dm  (ICD-250.00)  Current Medications (verified): 1)  Fluoxetine Hcl 40 Mg Caps (Fluoxetine Hcl) .... Once Daily 2)  Omeprazole 40 Mg Cpdr (Omeprazole) .[name].. 1 Tab By Mouth Two Times A Day 3)  Nephrocaps 1 Mg Caps (B Complex-C-Folic Acid) .... One Tablet Daily 4)  Levemir 100 Unit/ml Soln (Insulin Detemir) .[name].. 18 Units in Am Once A Day 5)  Ferrous Sulfate 325 (65 Fe) Mg Tabs (Ferrous Sulfate) .... Take One Tablet 2 Times A Day 6)  Metoprolol Succinate 25 Mg Xr24h-Tab (Metoprolol Succinate) .... One Tablet By Mouth 2 Times Daily 7)  Clonazepam 0.5 Mg Tabs (Clonazepam) .[name].. 1 Trab By Mouth Every 8 Hours As Needed For Agitation 8)  Novolog 100 Unit/ml Soln (Insulin Aspart) .... 6 Units With Every Meal + Slinding Scale 9)  Hydrocodone-Acetaminophen 10-325 Mg Tabs (Hydrocodone-Acetaminophen) .[name].. 1-2 Every 4-6 Hours As Needed Max Acetominophen Is 4000 Mg Daily 10)  Coumadin 2.5 Mg Tabs (Warfarin Sodium) .... As Directed 11)  Bayer Breeze 2 Test  Disk (Glucose Blood) .... Check Blood Sugar Three Times A Day 12)  Lancets  Misc (Lancets) .... Check Blood Sugar Three Times A Day 13)  Bd Insulin Syringe Ultrafine 31g X 5/16" 0.5 Ml Misc (Insulin Syringe-Needle U-100) .Marland KitchenMarland KitchenMarland Kitchen  Inject Insulin Four Times A Day 14)  Coumadin 2 Mg Tabs (Warfarin Sodium) .... As Directed 15)  Doxycycline Hyclate 100 Mg Caps (Doxycycline Hyclate) .... Take One Tablet Twice Daily 16)  Fluticasone Propionate 50 Mcg/act Susp (Fluticasone Propionate) .... 2 Sprays Per Nostril Daily  Allergies: 1)  ! Sulfacetamide Sodium (Sulfacetamide Sodium)  Past History:  Past medical, surgical, family and social histories (including risk  factors) reviewed, and no changes noted (except as noted below).  Past Medical History: Reviewed history from 09/02/2009 and no changes required. ICU stay, sepsis,  strep viridans. Status post renal failure, liver failure, tracheostomy, with recovery. 07/2009 HERPES ZOSTER W/NERVOUS COMPLICATION NEC (ICD-053.19) DIVERTICULOSIS, COLON (ICD-562.10) PULMONARY EMBOLISM, DVT (ICD-415.19) RESTLESS LEG SYNDROME (ICD-333.94) OBESITY, BMI 35 (ICD-278.00) IRRITABLE BOWEL SYNDROME (ICD-564.1) GASTROPARESIS (ICD-536.3) HYPERTENSION (ICD-401.9) LOW BACK PAIN, CHRONIC (ICD-724.2) DEPRESSION (ICD-311) ANXIETY (ICD-300.00) ASTHMA, INTERMITTENT, MILD (ICD-493.90) GERD, ESOPHAGITIS (ICD-530.81) ATRIAL FIBRILLATION, PAROXYSMAL (ICD-427.31) HYPERCHOLESTEROLEMIA (ICD-272.0) DM (ICD-250.00)    Past Surgical History: Reviewed history from 05/23/2008 and no changes required. 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 Father: DIED 68 + MI, EMPHYSEMA Mother: DIED 78, PNA, ? UTERINE CA, METASTATIC Siblings: 4 BROTHERS, MI AGE 65, PROSTATE CA 1 SISTER ALZHEIMER'S, PNA NO MI < 55  Social History: Reviewed history from 11/07/2009 and no changes required. Marital Status: Married X 25 YEARS Children: 1 DAUGHTER, HEALTHY Occupation: RETIRED TRUCK DRIVER EXERCISE:  WALKS TREADMILL 30 MINUTES DIET:  3 MEALS, F& V, SOME WATER, CRYSTAL LIGHT, NO FAST FOOd Patient never smoked.   Review of Systems General:  Denies fatigue and fever. CV:  Denies chest pain or discomfort. Resp:  Denies shortness of breath. GI:  Denies abdominal pain; left low back pain.  Physical Exam  General:  Overweight appearing male inNAD Head:  no maxillary sinus ttp Eyes:  No corneal or conjunctival inflammation noted.  EOMI. Perrla. Funduscopic exam benign, without hemorrhages, exudates or papilledema. Vision grossly normal. Ears:  External ear exam shows no significant lesions or deformities.  Otoscopic examination reveals clear canals, tympanic membranes are intact bilaterally without bulging, retraction, inflammation or discharge. Hearing is grossly normal bilaterally. Nose:  nasal dischargemucosal pallor.   Mouth:  MMM Neck:  no carotid bruit or thyromegaly no cervical or supraclavicular lymphadenopathy  Lungs:  crackles at base of right lung, clear otherwise..no wheeze , rales Heart:  Normal rate and regular rhythm. S1 and S2 normal without gallop, murmur, click, rub or other extra sounds. Pulses:  R and L posterior tibial pulses are full and equal bilaterally  Extremities:  no edmea    Impression & Recommendations:  Problem # 1:  ACUTE BRONCHITIS (ICD-466.0) Possible atypical pneumonia...currently being covered with doxycycline. Considered X-ray of lung to eval crackles in base..but pt opted to complete 10 day course of doxy. If not improving consider X-ray eval.  No current COPD/asthma exacerbation.  Disucssed limiting use of  rescue nebs and inhalers.  The following medications were removed from the medication list:    Azithromycin 250 Mg Tabs (Azithromycin) .[name]... 2 tab by mouth x 1, 1 tab by mouth daily His updated medication list for this problem includes:    Doxycycline Hyclate 100 Mg Caps (Doxycycline hyclate) .[name]... Take one tablet twice daily  Problem # 2:  OTHER DISEASES OF NASAL CAVITY AND SINUSES (ICD-478.19) COntinued congestion...this is really  the main issue for pt...continue mucinex and nasal saline. Add nasal steroid.  Complete Medication List: 1)  Fluoxetine Hcl 40 Mg Caps (Fluoxetine hcl) .... Once daily 2)  Omeprazole 40 Mg Cpdr (Omeprazole) .[name].. 1 tab by mouth two times a day 3)  Nephrocaps 1 Mg Caps (B complex-c-folic acid) .... One tablet daily 4)  Levemir 100 Unit/ml Soln  (Insulin detemir) .[name].. 18 units in am once a day 5)  Ferrous Sulfate 325 (65 Fe) Mg Tabs (Ferrous sulfate) .... Take one tablet 2 times a day 6)  Metoprolol Succinate 25 Mg Xr24h-tab (Metoprolol succinate) .... One tablet by mouth 2 times daily 7)  Clonazepam 0.5 Mg Tabs (Clonazepam) .[name].. 1 trab by mouth every 8 hours as needed for agitation 8)  Novolog 100 Unit/ml Soln (Insulin aspart) .... 6 units with every meal + slinding scale 9)  Hydrocodone-acetaminophen 10-325 Mg Tabs (Hydrocodone-acetaminophen) .[name].. 1-2 every 4-6 hours as needed max acetominophen is 4000 mg daily 10)  Coumadin 2.5 Mg Tabs (Warfarin sodium) .... As directed 11)  Bayer Breeze 2 Test Disk (Glucose blood) .... Check blood sugar three times a day 12)  Lancets Misc (Lancets) .... Check blood sugar three times a day 13)  Bd Insulin Syringe Ultrafine 31g X 5/16" 0.5 Ml Misc (Insulin syringe-needle u-100) .... Inject insulin four times a day 14)  Coumadin 2 Mg Tabs (Warfarin sodium) .... As directed 15)  Doxycycline Hyclate 100 Mg Caps (Doxycycline hyclate) .... Take one tablet twice daily 16)  Fluticasone Propionate 50 Mcg/act Susp (Fluticasone propionate) .... 2 sprays per nostril daily  Patient Instructions: 1)   Call us to given an update in 1 week on how you are feeling. 2)  Start nasal steroid spray. 3)   Continue antibitoics for 5 more days. 4)   Call if shortness of breath or wheeze.  5)  Limit nebulizers and  inhalers to only with wheezing and for rescue. Prescriptions: FLUTICASONE PROPIONATE 50 MCG/ACT SUSP (FLUTICASONE PROPIONATE) 2 sprays per nostril daily  #1 x 0   Entered and Authorized by:   [name] MD   Signed by:   [name] MD on 12/24/2009   Method used:   [facility]        [facility] [facility]       [address]       [place], [place]  [address]       Ph: [phone] or [phone]       Fax: [phone]   RxID:   [id number] DOXYCYCLINE HYCLATE 100 MG CAPS  (DOXYCYCLINE HYCLATE) take one tablet twice daily  #10 x 0   Entered and Authorized by:   [name] MD   Signed by:   [name] MD on 12/24/2009   Method used:   [facility]        [facility] (retail)       [address]       [place], [place]  [address]       Ph: [phone] or [phone]       Fax: [phone]   RxID:   [id number]    Orders Added: 1)  Est. Patient Level III [20254]    Current Allergies (reviewed today): ! SULFACETAMIDE SODIUM (SULFACETAMIDE SODIUM)

## 2010-03-11 NOTE — Miscellaneous (Signed)
Summary: med list update  Clinical Lists Changes  Medications: Added new medication of BAYER BREEZE 2 TEST  DISK (GLUCOSE BLOOD) check blood sugar three times a day Added new medication of LANCETS  MISC (LANCETS) check blood sugar three times a day Added new medication of BD INSULIN SYRINGE ULTRAFINE 31G X 5/16" 0.5 ML MISC (INSULIN SYRINGE-NEEDLE U-100) inject insulin four times a day     Prior Medications: FLUOXETINE HCL 40 MG CAPS (FLUOXETINE HCL) once daily OMEPRAZOLE 40 MG CPDR (OMEPRAZOLE) 1 tab by mouth two times a day NEPHROCAPS 1 MG CAPS (B COMPLEX-C-FOLIC ACID) one tablet daily LEVEMIR 100 UNIT/ML SOLN (INSULIN DETEMIR) 18 units in am once a day FERROUS SULFATE 325 (65 FE) MG TABS (FERROUS SULFATE) take one tablet 2 times a day METOPROLOL SUCCINATE 25 MG XR24H-TAB (METOPROLOL SUCCINATE) one tablet by mouth 2 times daily CLONAZEPAM 0.5 MG TABS (CLONAZEPAM) 1 trab by mouth every 8 hours as needed for agitation NOVOLOG 100 UNIT/ML SOLN (INSULIN ASPART) 6 units with every meal + slinding scale HYDROCODONE-ACETAMINOPHEN 10-325 MG TABS (HYDROCODONE-ACETAMINOPHEN) 1-2 every 4-6 hours as needed MAX acetominophen is 4000 mg daily AMBIEN 5 MG TABS (ZOLPIDEM TARTRATE) take 1-2 by mouth at bedtime as needed for insomnia COUMADIN 2.5 MG TABS (WARFARIN SODIUM) as directed ALENDRONATE SODIUM 70 MG TABS (ALENDRONATE SODIUM) 1 tab by mouth weekly BAYER BREEZE 2 TEST  DISK (GLUCOSE BLOOD) check blood sugar three times a day LANCETS  MISC (LANCETS) check blood sugar three times a day BD INSULIN SYRINGE ULTRAFINE 31G X 5/16" 0.5 ML MISC (INSULIN SYRINGE-NEEDLE U-100) inject insulin four times a day Current Allergies: ! SULFACETAMIDE SODIUM (SULFACETAMIDE SODIUM)

## 2010-03-11 NOTE — Progress Notes (Signed)
Summary: request for documentation  Phone Note From Pharmacy   Caller: [facility] Summary of Call: Form from [facility] is on your desk, they are requesting documentation for diabetic supplies. Initial call taken by: Lowella Petties CMA,  June 05, 2009 12:14 PM  Follow-up for Phone Call        Please preovide requested OV notes and labs.  Follow-up by: [name] MD,  June 05, 2009 12:21 PM  Additional Follow-up for Phone Call Additional follow up Details #1::        Medical records has faxed information, per Lupita Leash and Bonita Quin. Additional Follow-up by: Lowella Petties CMA,  June 05, 2009 4:00 PM

## 2010-03-11 NOTE — Progress Notes (Signed)
Summary: refill request for warfarin  Phone Note Refill Request Message from:  wife  Refills Requested: Medication #1:  warfarin 2 mg This is not on med list, but pt's wifes says this is the dose that pt takes everyday.  He needs 90 day script [facility] and [facility] day [facility].  Initial call taken by: Lowella Petties CMA,  October 24, 2009 11:25 AM  Follow-up for Phone Call        Add to EMR and fill 3 RF  Follow-up by: [name] MD,  October 24, 2009 11:26 AM    New/Updated Medications: WARFARIN SODIUM 2 MG TABS (WARFARIN SODIUM) take as directed WARFARIN SODIUM 2 MG TABS (WARFARIN SODIUM) take as directed Prescriptions: WARFARIN SODIUM 2 MG TABS (WARFARIN SODIUM) take as directed  #30 x 0   Entered by:   Benny Lennert CMA (AAMA)   Authorized by:   [name] MD   Signed by:   [name] MD on 10/24/2009   Method used:   [facility]        [facility] [facility]       [address]       [place], [place]  [address]       Ph: [phone] or [phone]       Fax: [phone]   RxID:   [id number] WARFARIN SODIUM 2 MG TABS (WARFARIN SODIUM) take as directed  #90 x 3   Entered by:   Benny Lennert CMA (AAMA)   Authorized by:   [name] MD   Signed by:   [name] MD on 10/24/2009   Method used:   Electronically to        [facility] ([facility]       [address]       [place], [place]  [address]       Ph: [phone]       Fax: [phone]   RxID:   [id number]   Prior Medications: FLUOXETINE HCL 40 MG CAPS (FLUOXETINE HCL) once daily OMEPRAZOLE 40 MG CPDR (OMEPRAZOLE) 1 tab by mouth two times a day NEPHROCAPS 1 MG CAPS (B COMPLEX-C-FOLIC ACID) one tablet daily LEVEMIR 100 UNIT/ML SOLN (INSULIN DETEMIR) 14 units in am once a day FERROUS SULFATE 325 (65 FE) MG TABS (FERROUS SULFATE) take one tablet 2 times a day METOPROLOL SUCCINATE 25 MG XR24H-TAB (METOPROLOL  SUCCINATE) one tablet by mouth 2 times daily CLONAZEPAM 0.5 MG TABS (CLONAZEPAM) 1 trab by mouth every 8 hours as needed for agitation NOVOLOG 100 UNIT/ML SOLN (INSULIN ASPART) 6 units with every meal +scale HYDROCODONE-ACETAMINOPHEN 10-325 MG TABS (HYDROCODONE-ACETAMINOPHEN) 1-2 every 4-6 hours as needed MAX acetominophen is 4000 mg daily PREDNISONE 20 MG TABS (PREDNISONE) 2 tabs by mouth x 5 days, then 1 tab by mouth x 3 days AMBIEN 5 MG TABS (ZOLPIDEM TARTRATE) take 1-2 by mouth at bedtime as needed for insomnia Current Allergies: ! SULFACETAMIDE SODIUM (SULFACETAMIDE SODIUM)

## 2010-03-11 NOTE — Progress Notes (Signed)
Summary: Not feeling any better  Phone Note Call from Patient Call back at Home Phone [phone]   Caller: Patient Call For: Dr. Para March Summary of Call: Patient has an appt scheduled with you this afternoon but he says that he really doesn't feel like coming out to come to this appt.  He states that he has a sore throat, chest and head congestion and his breathing was a little tighter last night.  He wanted to know if something can be called in for him instead of him coming back in to be seen.  I advised patient that you may want to see him instead of just calling something in for him.  Please advise.  Uses [facility]. Initial call taken by: Linde Gillis CMA Duncan Dull),  December 27, 2009 11:05 AM  Follow-up for Phone Call        I need to see him to address his concerns.  I don't know what to use for him unless I can check him first.  If he thinks he is clearly worse, then he needs eval.  If he wants to give it a little longer for Dr. Daphine Deutscher recs to have some effect, then he could get checked next week.  Follow-up by: Crawford Givens MD,  December 27, 2009 12:02 PM  Additional Follow-up for Phone Call Additional follow up Details #1::        Patient advised as instructed via telephone.  He will be in this afternoon to see Dr. Para March at 3:00. Additional Follow-up by: Linde Gillis CMA Duncan Dull),  December 27, 2009 12:10 PM

## 2010-03-11 NOTE — Medication Information (Signed)
Summary: [facility] 2 10-TEST DISC  [facility] 10-TEST DISC   Imported By: Carin Primrose 06/05/2009 16:16:54  _____________________________________________________________________  External Attachment:    Type:   Image     Comment:   External Document

## 2010-03-11 NOTE — Assessment & Plan Note (Signed)
Summary: COUGH,HA/CLE   Vital Signs:  Patient profile:   70 year old male Height:      74 inches Weight:      262.19 pounds BMI:     33.78 Temp:     98.1 degrees F oral Pulse rate:   84 / minute Pulse rhythm:   regular BP sitting:   124 / 80  (left arm) Cuff size:   large  Vitals Entered By: Delilah Shan CMA Ember Henrikson Dull) (December 27, 2009 2:58 PM) CC: Cough, headache, chest congestion, ST   History of Present Illness: H/o asthma.  Started 3 weeks ago.  Intilaly on zmax, changed to doxy.  No dec sx since then. No smoking.  No fevers.  Cough is getting worse, now with ST, increase in rhinorrhea.  +sputum, discolored.  L lower chest pain resolved.  Some wheezing per patient, none currently .  Current Medications (verified): 1)  Fluoxetine Hcl 40 Mg Caps (Fluoxetine Hcl) .... Once Daily 2)  Omeprazole 40 Mg Cpdr (Omeprazole) .[name].. 1 Tab By Mouth Two Times A Day 3)  Nephrocaps 1 Mg Caps (B Complex-C-Folic Acid) .... One Tablet Daily 4)  Levemir 100 Unit/ml Soln (Insulin Detemir) .[name].. 18 Units in Am Once A Day 5)  Ferrous Sulfate 325 (65 Fe) Mg Tabs (Ferrous Sulfate) .... Take One Tablet 2 Times A Day 6)  Metoprolol Succinate 25 Mg Xr24h-Tab (Metoprolol Succinate) .... One Tablet By Mouth 2 Times Daily 7)  Clonazepam 0.5 Mg Tabs (Clonazepam) .[name].. 1 Trab By Mouth Every 8 Hours As Needed For Agitation 8)  Novolog 100 Unit/ml Soln (Insulin Aspart) .... 6 Units With Every Meal + Slinding Scale 9)  Hydrocodone-Acetaminophen 10-325 Mg Tabs (Hydrocodone-Acetaminophen) .[name].. 1-2 Every 4-6 Hours As Needed Max Acetominophen Is 4000 Mg Daily 10)  Coumadin 2.5 Mg Tabs (Warfarin Sodium) .... As Directed 11)  [facility] Breeze 2 Test  Disk (Glucose Blood) .... Check Blood Sugar Three Times A Day 12)  Lancets  Misc (Lancets) .... Check Blood Sugar Three Times A Day 13)  Bd Insulin Syringe Ultrafine 31g X 5/16" 0.5 Ml Misc (Insulin Syringe-Needle U-100) .... Inject Insulin Four Times A Day 14)  Coumadin 2 Mg Tabs  (Warfarin Sodium) .... As Directed 15)  Doxycycline Hyclate 100 Mg Caps (Doxycycline Hyclate) .... Take One Tablet Twice Daily 16)  Fluticasone Propionate 50 Mcg/act Susp (Fluticasone Propionate) .... 2 Sprays Per Nostril Daily  Allergies: 1)  ! Sulfacetamide Sodium (Sulfacetamide Sodium)  Review of Systems       See HPI.  Otherwise negative.    Physical Exam  General:  GEN: nad, alert and oriented HEENT: mucous membranes moist, TM w/o erythema, nasal epithelium injected, OP with cobblestoning, L frontal sinus tender to palpation but o/w sinuses not tender to palpation  NECK: supple w/o LA CV: rrr. PULM: ctab, no inc wob, no crackles, no wheeze   Impression & Recommendations:  Problem # 1:  OTHER DISEASES OF NASAL CAVITY AND SINUSES (ICD-478.19) L frontal sinus pain.  Lungs clear.  No indicaiton of PNA and no need to image.  I would finish the antibiotics as directed and then call back with update.  We could consider broadening him on avelox, but would need to make arrangement for INR checks in the meantime if that is done.  I will await return call from patient.   Complete Medication List: 1)  Fluoxetine Hcl 40 Mg Caps (Fluoxetine hcl) .... Once daily 2)  Omeprazole 40 Mg Cpdr (Omeprazole) .[name].. 1 tab by mouth  two times a day 3)  Nephrocaps 1 Mg Caps (B complex-c-folic acid) .... One tablet daily 4)  Levemir 100 Unit/ml Soln (Insulin detemir) .... 30 units in am once a day 5)  Ferrous Sulfate 325 (65 Fe) Mg Tabs (Ferrous sulfate) .... Take one tablet 2 times a day 6)  Metoprolol Succinate 25 Mg Xr24h-tab (Metoprolol succinate) .... One tablet by mouth 2 times daily 7)  Clonazepam 0.5 Mg Tabs (Clonazepam) .[name].. 1 trab by mouth every 8 hours as needed for agitation 8)  Novolog 100 Unit/ml Soln (Insulin aspart) .... 9 units with every meal + slinding scale 9)  Hydrocodone-acetaminophen 10-325 Mg Tabs (Hydrocodone-acetaminophen) .[name].. 1-2 every 4-6 hours as needed max acetominophen is  4000 mg daily 10)  Coumadin 2.5 Mg Tabs (Warfarin sodium) .... As directed 11)  Bayer Breeze 2 Test Disk (Glucose blood) .... Check blood sugar three times a day 12)  Lancets Misc (Lancets) .... Check blood sugar three times a day 13)  Bd Insulin Syringe Ultrafine 31g X 5/16" 0.5 Ml Misc (Insulin syringe-needle u-100) .... Inject insulin four times a day 14)  Coumadin 2 Mg Tabs (Warfarin sodium) .... As directed 15)  Doxycycline Hyclate 100 Mg Caps (Doxycycline hyclate) .... Take one tablet twice daily 16)  Fluticasone Propionate 50 Mcg/act Susp (Fluticasone propionate) .... 2 sprays per nostril daily 17)  Hydrocodone-homatropine 5-1.5 Mg/102ml Syrp (Hydrocodone-homatropine) .... 5ml by mouth q6h as needed cough  Patient Instructions: 1)  I want you to finish the antibiotics and use the cough medicine (it may make you drowsy) over the weekend.  Let me know how you are doing on Monday morning.  Call me with an update.  Take care.  Prescriptions: HYDROCODONE-HOMATROPINE 5-1.5 MG/5ML SYRP (HYDROCODONE-HOMATROPINE) 5ml by mouth q6h as needed cough  #6oz x 0   Entered and Authorized by:   Crawford Givens MD   Signed by:   Crawford Givens MD on 12/27/2009   Method used:   Print then Give to Patient   RxID:   [id number]    Orders Added: 1)  Est. Patient Level III [14782]    Current Allergies (reviewed today): ! SULFACETAMIDE SODIUM (SULFACETAMIDE SODIUM)

## 2010-03-11 NOTE — Miscellaneous (Signed)
  Clinical Lists Changes  Observations: Added new observation of ECHOINTERP: 1. Left ventricle: The cavity size was normal. Wall thickness was    increased in a pattern of mild LVH. Systolic function was normal.    The estimated ejection fraction was in the range of 55% to 60%.    Hypokinesis of the basal-mid inferolateral myocardium. 2. Aortic valve: Trileaflet; mildly calcified leaflets. 3. Aorta: The aortic root was mildly dilated. Aortic root dimension:    40mm (ED). 4. Mitral valve: Mild mitral annular calcification. Trivial    regurgitation. 5. Left atrium: The atrium was moderately dilated. 6. Tricuspid valve: Mild regurgitation. 7. Pulmonary arteries: PA peak pressure: 34mm Hg (S). 8. Pericardium, extracardiac: There was no pericardial effusion.  (06/01/2008 13:54)      Echocardiogram  Procedure date:  06/01/2008  Findings:      1. Left ventricle: The cavity size was normal. Wall thickness was    increased in a pattern of mild LVH. Systolic function was normal.    The estimated ejection fraction was in the range of 55% to 60%.    Hypokinesis of the basal-mid inferolateral myocardium. 2. Aortic valve: Trileaflet; mildly calcified leaflets. 3. Aorta: The aortic root was mildly dilated. Aortic root dimension:    40mm (ED). 4. Mitral valve: Mild mitral annular calcification. Trivial    regurgitation. 5. Left atrium: The atrium was moderately dilated. 6. Tricuspid valve: Mild regurgitation. 7. Pulmonary arteries: PA peak pressure: 34mm Hg (S). 8. Pericardium, extracardiac: There was no pericardial effusion.

## 2010-03-11 NOTE — Medication Information (Signed)
Summary: [facility]   Imported By: Lanelle Bal 02/28/2009 12:45:56  _____________________________________________________________________  External Attachment:    Type:   Image     Comment:   External Document

## 2010-03-11 NOTE — Miscellaneous (Signed)
Summary: Orders Update pft charges  Clinical Lists Changes  Orders: Added new Service order of Carbon Monoxide diffusing w/capacity (94720) - Signed Added new Service order of Lung Volumes (94240) - Signed Added new Service order of Spirometry (Pre & Post) (94060) - Signed 

## 2010-03-11 NOTE — Progress Notes (Signed)
Summary: refill request for clorazepate  Phone Note Refill Request Message from:  Fax from Pharmacy  Refills Requested: Medication #1:  CLORAZEPATE DIPOTASSIUM 3.75 MG TABS Take 1 tablet by mouth twice a day   Last Refilled: 02/15/2009 [facility] [address], [phone].  Initial call taken by: Lowella Petties CMA,  Jun 27, 2009 12:57 PM  Follow-up for Phone Call        [facility] [address]. Follow-up by: Lowella Petties CMA,  Jun 28, 2009 2:19 PM    New/Updated Medications: CLORAZEPATE DIPOTASSIUM 3.75 MG TABS (CLORAZEPATE DIPOTASSIUM) Take 1 tablet daily as needed anxiery limit use Prescriptions: CLORAZEPATE DIPOTASSIUM 3.75 MG TABS (CLORAZEPATE DIPOTASSIUM) Take 1 tablet daily as needed anxiery limit use  #30 x 0   Entered and Authorized by:   [name] MD   Signed by:   [name] MD on 06/28/2009   Method used:   Telephoned to ...       [facility] [id number] (retail[facility]       [address]       [place], [place]  [address]       Ph: [phone] or [phone]       Fax: [phone]   RxID:   [id number] CLORAZEPATE DIPOTASSIUM 3.75 MG TABS (CLORAZEPATE DIPOTASSIUM) Take 1 tablet daily as needed anxiery limit use  #30 x 0   Entered and Authorized by:   [name] MD   Signed by:   [name] MD on 06/28/2009   Method used:   Telephoned to ...       [facility] (mail-order)       [address]       [place], [place]  [address]       Ph: [phone]       Fax: [phone]   RxID:   [id number]

## 2010-03-11 NOTE — Assessment & Plan Note (Signed)
Summary: [facility].[name] CYD   Vital Signs:  Patient profile:   70 year old male Height:      74 inches Weight:      251.8 pounds BMI:     32.45 Temp:     97.5 degrees F oral Pulse rate:   82 / minute Pulse rhythm:   regular BP sitting:   106 / 70  (left arm) Cuff size:   large  Vitals Entered By: Benny Lennert CMA Duncan Dull) (August 28, 2009 12:22 PM)  History of Present Illness: Chief complaint hospital follow up  Admitted for  left groin cellutitis and Septic shock (strep viridins bacteremia) 6/8 to 7/8 [facility]. Was discharged from rehab 2 days ago.  In hospital treated with 3 IV antibitocs...central line placed, on right. Had cath placed. Has debrided infection in left abcess. Placed on ventilator for surgery.  Once trach placed6/22...he improved and became more responsive.  Was unable to get off ventilator and was unresonsive at this point.  To get of ventilator..trach was placed Acute renal failure due to sepsis... dialysis temporarily...on discharge 2 days ago 1.9 Liver failure due to sepsis.Jonathan Kitchennormalized at discharge.  Poor control DM (initial blood sugar on admission 500)...at discharge CBGs improved  on levemir 5 mg two times a day.  Now off metformin. Wife requests referral to endocrinologist.  Janina Mayo removed (coughed out) 7/6 ...on regular diet...no coughing with eating. Healing well now.   While in hosp spent time in ICU on ventilator  Discharged from hospital Wife doing wound care. PT planned for tommorow.  Dr. Reesa Chew (surgeon)... appt for wound check 8/10.  HTN, well controlled on metoprolol.  Depression..some agitation later in day...better control back on fluoxetine.   in hosptial given ativan.  Some insomnia.  Anemia in hospital following suregery...on ferrous sulfate.  Off Chol meds.   Problems Prior to Update: 1)  Cough  (ICD-786.2) 2)  Wheezing  (ICD-786.07) 3)  Sore Throat  (ICD-462) 4)  Renal Insufficiency   (ICD-588.9) 5)  Unspecified Anemia  (ICD-285.9) 6)  Allergic Rhinitis Cause Unspecified  (ICD-477.9) 7)  Asthma, Intrinsic, With Acute Exacerbation  (ICD-493.12) 8)  Chest Xray, Abnormal  (ICD-793.1) 9)  Special Screening Malignant Neoplasm of Prostate  (ICD-V76.44) 10)  Hydronephrosis, Right  (ICD-591) 11)  Encounter For Long-term Use of Other Medications  (ICD-V58.69) 12)  Foot Surgery, Hx of  (ICD-V15.89) 13)  Knee Replacement, Left, Hx of  (ICD-V43.65) 14)  Encounter For Therapeutic Drug Monitoring  (ICD-V58.83) 15)  Aftercare, Long-term Use, Anticoagulants  (ICD-V58.61) 16)  Herpes Zoster W/nervous Complication Nec  (ICD-053.19) 17)  Diverticulosis, Colon  (ICD-562.10) 18)  Pulmonary Embolism, Dvt  (ICD-415.19) 19)  Restless Leg Syndrome  (ICD-333.94) 20)  Obesity, Bmi 35  (ICD-278.00) 21)  Irritable Bowel Syndrome  (ICD-564.1) 22)  Gastroparesis  (ICD-536.3) 23)  Hypertension  (ICD-401.9) 24)  Low Back Pain, Chronic  (ICD-724.2) 25)  Depression  (ICD-311) 26)  Anxiety  (ICD-300.00) 27)  Asthma, Persistent, Mild  (ICD-493.90) 28)  Gerd, Esophagitis  (ICD-530.81) 29)  Atrial Fibrillation, Paroxysmal  (ICD-427.31) 30)  Hypercholesterolemia  (ICD-272.0) 31)  Dm  (ICD-250.00)  Current Medications (verified): 1)  Fluoxetine Hcl 40 Mg Caps (Fluoxetine Hcl) .... Once Daily 2)  Omeprazole 40 Mg Cpdr (Omeprazole) .[name].. 1 Tab By Mouth Two Times A Day 3)  Nephrocaps 1 Mg Caps (B Complex-C-Folic Acid) .... One Tablet Daily 4)  Levemir 100 Unit/ml Soln (Insulin Detemir) .... 5 Units in Am and Pm 5)  Ferrous Sulfate 325 (  65 Fe) Mg Tabs (Ferrous Sulfate) .... Take One Tablet 2 Times A Day 6)  Klor-Con M10 10 Meq Cr-Tabs (Potassium Chloride Crys Cr) .... Take One Tablet Daily For 2 Weeks 7)  Metoprolol Succinate 25 Mg Xr24h-Tab (Metoprolol Succinate) .... One Tablet By Mouth 2 Times Daily  Allergies: 1)  ! Sulfacetamide Sodium (Sulfacetamide Sodium)  Past History:  Past medical,  surgical, family and social histories (including risk factors) reviewed, and no changes noted (except as noted below).  Past Medical History: Reviewed history from 05/23/2008 and no changes required. 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 Father: DIED 12 + MI, EMPHYSEMA Mother: DIED 65, PNA, ? UTERINE CA, METASTATIC Siblings: 4 BROTHERS, MI AGE 69, PROSTATE CA 1 SISTER ALZHEIMER'S, PNA NO MI < 55  Social History: Reviewed history from 05/23/2008 and no changes required. Marital Status: Married X 25 YEARS Children: 1 DAUGHTER, HEALTHY Occupation: RETIRED TRUCK DRIVER EXERCISE:  WALKS TREADMILL 30 MINUTES DIET:  3 MEALS, F& V, SOME WATER, CRYSTAL LIGHT, NO FAST FOOd  Review of Systems General:  Complains of fatigue; denies fever. ENT:  Complains of postnasal drainage and sore throat; denies ear discharge, earache, and nasal congestion. CV:  Denies chest pain or discomfort. Resp:  Denies shortness of  breath. GI:  Denies abdominal pain. GU:  Denies dysuria.  Physical Exam  General:  Fatigued appearing male in NAD, talks infrequently in low voice, flat affect Significant weight loss, pale appearance Eyes:  No corneal or conjunctival inflammation noted. EOMI. Perrla. Funduscopic exam benign, without hemorrhages, exudates or papilledema. Vision grossly normal. Ears:  External ear exam shows no significant lesions or deformities.  Otoscopic examination reveals clear canals, tympanic membranes are intact bilaterally without bulging, retraction, inflammation or discharge. Hearing is grossly normal bilaterally. Nose:  External nasal examination shows no deformity or inflammation. Nasal mucosa are pink and moist without lesions or exudates. Mouth:  Oral mucosa and oropharynx without lesions or exudates.  Teeth in good repair. Neck:  healing scar from trach  no cervical or supraclavicular lymphadenopathy no carotid bruit or thyromegaly   Lungs:  Normal respiratory effort, chest expands symmetrically. Lungs are clear to auscultation, no crackles or wheezes. Heart:  Normal rate and regular rhythm. S1 and S2 normal without gallop, murmur, click, rub or other extra sounds. Abdomen:  Bowel sounds positive,abdomen soft and non-tender without masses, organomegaly or hernias noted. Pulses:  R and L posterior tibial pulses are full and equal bilaterally  Extremities:  no edema  Skin:  left groin wound, granulation tissue, some overgrowth but per wife improved since recent silver nitrate at edges of wound.   Diabetes Management Exam:    Foot Exam (with socks and/or shoes not present):       Sensory-Pinprick/Light touch:          Left medial foot (L-4):  normal          Left dorsal foot (L-5): normal          Left lateral foot (S-1): normal          Right medial foot (L-4): normal          Right dorsal foot (L-5): normal          Right lateral foot (S-1): normal       Sensory-Monofilament:           Left foot: normal          Right foot: normal       Inspection:          Left foot: normal          Right foot: normal       Nails:          Left foot: normal          Right foot: normal   Impression & Recommendations:  Problem # 1:  SORE THROAT (ICD-462) Possible beginnings of viral illness. No clear sign of thruch. Doubt trach/tube  irritation given removed several weeks ago. Treat symptomatically. he will call if symptoms worsening or if any cough, productive, SOB. The following medications were removed from the medication list:    Indomethacin 50 Mg Caps (Indomethacin) ..... Every 8 hours as needed  Problem # 2:  RENAL INSUFFICIENCY (ICD-588.9) Resolved  Problem # 3:  DM (ICD-250.00) improved control on insulin. Will refer to [facility] for further recommendations on treatment in this comp;licated pt.  The following medications were removed from the medication list:    Metformin Hcl 1000 Mg Tabs (Metformin hcl) .[name]..[name] Two times a day    Glipizide Xl 10 Mg Xr24h-tab (Glipizide) .[name]... Take 1 tablet by mouth once a day    Byetta 10 Mcg Pen 10 Mcg/0.84ml Soln (Exenatide) .[name]... 1 inj subcutaneously two times a day His updated medication list for this problem includes:    Levemir 100 Unit/ml Soln (Insulin detemir) .Jonathan KitchenMarland KitchenMarland KitchenMarland Mercado 5 units in am and pm  Orders: Endocrinology Referral (Endocrine)  Problem # 4:  HYPERCHOLESTEROLEMIA (ICD-272.0) Stay of cholesterol medicaiton at this point until.Jonathan Kitchenliver function test evaluated for 1 month...has liver failure in hospital, resolved at last check.  The following medications were removed from the medication list:    Fenofibrate 160 Mg Tabs (Fenofibrate) .[name]... Take 1 tablet by mouth once a day    Simvastatin 80 Mg Tabs (Simvastatin) .[name]... Take 1 tablet by mouth once a day  Problem # 5:  Cellulitis  Resolved S/p IV antibitoc in hospital.  HAs wound care follow up with surgeon in next few weeks. Today site has healthy granulation tissue.   Problem # 6:   ASTHMA, PERSISTENT, MILD (ICD-493.90) Stable at current time. No wheezing.  (I cannot change EMR to reflect asthma..moderate persistant..not mild as some one in chart currently) Continue Advair.  The following medications were removed from the medication list:    Advair Diskus 250-50 Mcg/dose Aepb (Fluticasone-salmeterol) .[name]... 1 puff two times a day  Problem # 7:  ANXIETY (ICD-300.00) Poor control of anxiety in past few weeks due to frustration in health. Agitated in afternoons...will use small dose of clonazepam. Stop chlorazepate. Continue fluoxetine...we may need to increase this if symptoms continue.  The following medications were removed from the medication list:    Clorazepate Dipotassium 3.75 Mg Tabs (Clorazepate dipotassium) .[name]... Take 1 tablet daily as needed anxiery limit use His updated medication list for this problem  includes:    Fluoxetine Hcl 40 Mg Caps (Fluoxetine hcl) ..... Once daily    Clonazepam 0.5 Mg Tabs (Clonazepam) .[name]... 1 trab by mouth every 8 hours as needed for agitation  Complete Medication List: 1)  Fluoxetine Hcl 40 Mg Caps (Fluoxetine hcl) .... Once daily 2)  Omeprazole 40 Mg Cpdr (Omeprazole) .[name].. 1 tab by mouth two times a day 3)  Nephrocaps 1 Mg Caps (B complex-c-folic acid) .... One tablet daily 4)  Levemir 100 Unit/ml Soln (Insulin detemir) .... 5 units in am and pm 5)  Ferrous Sulfate 325 (65 Fe) Mg Tabs (Ferrous sulfate) .... Take one tablet 2 times a day 6)  Klor-con M10 10 Meq Cr-tabs (Potassium chloride crys cr) .... Take one tablet daily for 2 weeks 7)  Metoprolol Succinate 25 Mg Xr24h-tab (Metoprolol succinate) .... One tablet by mouth 2 times daily 8)  Clonazepam 0.5 Mg Tabs (Clonazepam) .[name].. 1 trab by mouth every 8 hours as needed for agitation  Patient Instructions: 1)  Referral Appointment Information 2)  Day/Date: 3)  Time: 4)  Place/MD: 5)  Address: 6)  Phone/Fax: 7)  Patient given appointment information. Information/Orders  faxed/mailed.  8)  Keep current appts...ask at front for scheduled appts.[name] 9)   PLEASE ADD CBC AND IRON PANEL, FERRITIN and TRANSFERRIN to upcoming lab appt.  10)   Hold cholesterol meds for now. Continue potassium and iron. 11)  Change back to omeprazole. 12)   Continue with physical therapy. 13)   Follow blood sugars closely.  Prescriptions: METOPROLOL SUCCINATE 25 MG XR24H-TAB (METOPROLOL SUCCINATE) one tablet by mouth 2 times daily  #180 x 3   Entered and Authorized by:   [name] MD   Signed by:   [name] MD on 08/28/2009   Method used:   Electronically to        [facility]* (mail-order)       [address]       [place], [place]  [address]       Ph: [phone]       Fax: [phone]   RxID:   [id number] LEVEMIR 100 UNIT/ML SOLN (INSULIN DETEMIR) 5 units in am and pm  #1 box x 3   Entered and Authorized by:   [name] MD   Signed by:   [name] MD on 08/28/2009   Method used:   Electronically to        [facility]* (mail-order)       [address]       [place], [place]  [address]       Ph: [phone]       Fax: [phone]   RxID:   [id number] OMEPRAZOLE 40 MG CPDR (OMEPRAZOLE) 1 tab by mouth two times a day  #90 x 3   Entered and Authorized by:   [name] MD   Signed by:   [name] MD on 08/28/2009   Method used:   Electronically to        [facility]* (mail-order)       [address]       [place], [place]  [address]       Ph: [phone]       Fax: [phone]   RxID:   [id number] FLUOXETINE HCL 40 MG CAPS (FLUOXETINE HCL) once daily  #90 x 3   Entered and Authorized by:   [name] MD   Signed by:   [name] MD on  08/28/2009   Method used:   Electronically to        [facility]* (mail-order)       [address]       [place], [place]  [address]       Ph: [phone]       Fax: [phone]   RxID:   [id number] CLONAZEPAM 0.5 MG TABS  (CLONAZEPAM) 1 trab by mouth every 8 hours as needed for agitation  #30 x 0   Entered and Authorized by:   [name] MD   Signed by:   [name] MD on 08/28/2009   Method used:   Print then Give to Patient   RxID:   [id number]   Current Allergies (reviewed today): ! SULFACETAMIDE SODIUM (SULFACETAMIDE SODIUM)

## 2010-03-11 NOTE — Progress Notes (Signed)
Summary: clorazepate  Phone Note Refill Request Message from:  [employer] on September 19, 2009 3:26 PM  Refills Requested: Medication #1:  clorazepate 3.75   Supply Requested: 1 month [facility]   Method Requested: Electronic Initial call taken by: Benny Lennert CMA Duncan Dull),  September 19, 2009 3:27 PM  Follow-up for Phone Call        Takjing clonezepam instead, but call to verify with pt or wife.  Follow-up by: [name] MD,  September 20, 2009 8:33 AM  Additional Follow-up for Phone Call Additional follow up Details #1::        This is right b/c this was not on the medication list  Additional Follow-up by: Benny Lennert CMA Duncan Dull),  September 20, 2009 8:42 AM

## 2010-03-11 NOTE — Miscellaneous (Signed)
  Clinical Lists Changes  Medications: Added new medication of COUMADIN 2 MG TABS (WARFARIN SODIUM) as directed     Prior Medications: FLUOXETINE HCL 40 MG CAPS (FLUOXETINE HCL) once daily OMEPRAZOLE 40 MG CPDR (OMEPRAZOLE) 1 tab by mouth two times a day NEPHROCAPS 1 MG CAPS (B COMPLEX-C-FOLIC ACID) one tablet daily LEVEMIR 100 UNIT/ML SOLN (INSULIN DETEMIR) 18 units in am once a day FERROUS SULFATE 325 (65 FE) MG TABS (FERROUS SULFATE) take one tablet 2 times a day METOPROLOL SUCCINATE 25 MG XR24H-TAB (METOPROLOL SUCCINATE) one tablet by mouth 2 times daily CLONAZEPAM 0.5 MG TABS (CLONAZEPAM) 1 trab by mouth every 8 hours as needed for agitation NOVOLOG 100 UNIT/ML SOLN (INSULIN ASPART) 6 units with every meal + slinding scale HYDROCODONE-ACETAMINOPHEN 10-325 MG TABS (HYDROCODONE-ACETAMINOPHEN) 1-2 every 4-6 hours as needed MAX acetominophen is 4000 mg daily AMBIEN 5 MG TABS (ZOLPIDEM TARTRATE) take 1-2 by mouth at bedtime as needed for insomnia COUMADIN 2.5 MG TABS (WARFARIN SODIUM) as directed ALENDRONATE SODIUM 70 MG TABS (ALENDRONATE SODIUM) 1 tab by mouth weekly AZITHROMYCIN 250 MG TABS (AZITHROMYCIN) 2 tab by mouth x 1, 1 tab by mouth daily BAYER BREEZE 2 TEST  DISK (GLUCOSE BLOOD) check blood sugar three times a day LANCETS  MISC (LANCETS) check blood sugar three times a day BD INSULIN SYRINGE ULTRAFINE 31G X 5/16" 0.5 ML MISC (INSULIN SYRINGE-NEEDLE U-100) inject insulin four times a day Current Allergies: ! SULFACETAMIDE SODIUM (SULFACETAMIDE SODIUM)

## 2010-03-11 NOTE — Progress Notes (Signed)
Summary: CALLING BECAUSE WIFE WAS HAVING Cp appt given  Phone Note Call from Patient Call back at Home Phone [phone]   Caller: Patient Summary of Call: PT CALLING TO SPEAK WITH DR.Jillienne Egner ABOUT HIS WIFE HAVING CHEST PAINS, THE WIFE IS NOT APT HERE AT [facility]. Initial call taken by: Judie Grieve,  March 04, 2009 9:10 AM  Follow-up for Phone Call        pt's wife started having chest pain after having a cortisone shot in hip.  pt requesting appt.  Chest pain was in center of chest and up into head.  Couldn't sleep and bad h/a, no SOB, N/V, diaphosis.  Pt has never had any testing for CAD and not having chest pain now.  Charolotte Capuchin, RN The first appt that is available is 04/08/2009.  Will continue to look for an earlier appt. Follow-up by: Charolotte Capuchin, RN,  March 05, 2009 9:18 AM

## 2010-03-11 NOTE — Medication Information (Signed)
Summary: [facility]  [facility]   Imported By: Lanelle Bal 04/24/2009 13:54:57  _____________________________________________________________________  External Attachment:    Type:   Image     Comment:   External Document

## 2010-03-11 NOTE — Assessment & Plan Note (Signed)
Summary: 3 M F/U   Vital Signs:  Patient profile:   70 year old male Height:      74 inches Weight:      284.6 pounds BMI:     36.67 Temp:     97.4 degrees F oral Pulse rate:   68 / minute Pulse rhythm:   regular BP sitting:   120 / 70  (left arm) Cuff size:   large  Vitals Entered By: Benny Lennert CMA Duncan Dull) (February 15, 2009 8:32 AM)  History of Present Illness:   DM, inadequate control  CBGs running high since recent prednisone despite being off... FBS 202. No new chest pain, no SOB on DM and glipizide   Has noted redness  on left  antecubital fossa x 1 week , tender, nonpuritic, warm to touch. No fever.    Problems Prior to Update: 1)  Allergic Rhinitis Cause Unspecified  (ICD-477.9) 2)  Uri  (ICD-465.9) 3)  Asthma, Intrinsic, With Acute Exacerbation  (ICD-493.12) 4)  Chest Xray, Abnormal  (ICD-793.1) 5)  Pneumonia  (ICD-486) 6)  Special Screening Malignant Neoplasm of Prostate  (ICD-V76.44) 7)  Hydronephrosis, Right  (ICD-591) 8)  Encounter For Long-term Use of Other Medications  (ICD-V58.69) 9)  Foot Surgery, Hx of  (ICD-V15.89) 10)  Knee Replacement, Left, Hx of  (ICD-V43.65) 11)  Encounter For Therapeutic Drug Monitoring  (ICD-V58.83) 12)  Aftercare, Long-term Use, Anticoagulants  (ICD-V58.61) 13)  Herpes Zoster W/nervous Complication Nec  (ICD-053.19) 14)  Diverticulosis, Colon  (ICD-562.10) 15)  Pulmonary Embolism, Dvt  (ICD-415.19) 16)  Restless Leg Syndrome  (ICD-333.94) 17)  Obesity, Bmi 35  (ICD-278.00) 18)  Irritable Bowel Syndrome  (ICD-564.1) 19)  Gastroparesis  (ICD-536.3) 20)  Hypertension  (ICD-401.9) 21)  Low Back Pain, Chronic  (ICD-724.2) 22)  Depression  (ICD-311) 23)  Anxiety  (ICD-300.00) 24)  Asthma, Persistent, Mild  (ICD-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 Take 1 Tablet By Mouth Twice A Day 2)  Atenolol 50 Mg Tabs (Atenolol) .... Once Daily 3)  Metformin Hcl 1000 Mg Tabs (Metformin Hcl) .... Two Times A Day 4)  Verapamil Hcl Cr 180 Mg Cp24 (Verapamil Hcl) .... Once Daily 5)  Warfarin Sodium 2.5 Mg Tabs (Warfarin Sodium) .... As Directed Daily 6)  Fluoxetine Hcl 40 Mg Caps (Fluoxetine Hcl) .... Once Daily 7)  Fenofibrate 160 Mg Tabs (Fenofibrate) .... Take 1 Tablet By Mouth Once A Day 8)  Indomethacin 50 Mg  Caps (Indomethacin) .... Every 8 Hours As Needed 9)  Lasix 20 Mg  Tabs (Furosemide) .... Take 1 Tablet By Mouth Once A Day By Mouth As Needed Swelling 10)  Simvastatin 80 Mg Tabs (Simvastatin) .... Take 1 Tablet By Mouth Once A Day 11)  Fish Oil Concentrate 1000 Mg  Caps (Omega-3 Fatty Acids) .... Take 2 Capsule By Mouth Two Times A Day 12)  Ascensia Autodisc Test   Strp (Glucose Blood) .... Test 1-2 Times Per Day 13)  Clobetasol Propionate 0.05 %  Soln (Clobetasol Propionate) .... Apply Once A Day As Needed 14)  Triamcinolone Acetonide 0.025 %  Crea (Triamcinolone Acetonide) .... Apply Once A Day As Needed 15)  Omeprazole 40 Mg Cpdr (Omeprazole) .[name].. 1 Tab By Mouth Two Times A Day 16)  Glipizide Xl 10 Mg Xr24h-Tab (Glipizide) .... Take 1 Tablet By  Mouth Once A Day 17)  Proair Hfa 108 (90 Base) Mcg/act Aers (Albuterol Sulfate) .... 2 Puffs Inhaled Every 6 Hours As Needed Wheeze. 18)  Fluticasone Propionate 50 Mcg/act Susp (Fluticasone Propionate) .... 2 Sprays Per Nostril Daily 19)  Bayer Breeze 2 Test  Disk (Glucose Blood) .... Check Cbg 2 Times Daily Dx 250.01 20)  Keflex 750 Mg Caps (Cephalexin) .[name].. 1 Tab  By Mouth Three Times A Day X 10 Days  Allergies: 1)  ! Sulfacetamide Sodium (Sulfacetamide Sodium)  Past History:  Past medical, surgical, family and social histories (including risk factors) reviewed, and no changes noted (except as noted below).  Past Medical  History: Reviewed history from 05/23/2008 and no changes required. 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 Father: DIED 44 + MI, EMPHYSEMA Mother: DIED 48, PNA, ? UTERINE CA, METASTATIC Siblings: 4 BROTHERS, MI AGE 52, PROSTATE CA 1 SISTER ALZHEIMER'S, PNA NO MI < 55  Social History: Reviewed history from 05/23/2008 and no changes required. Marital Status: Married X 25 YEARS Children: 1 DAUGHTER, HEALTHY Occupation: RETIRED TRUCK DRIVER EXERCISE:  WALKS TREADMILL 30 MINUTES DIET:  3 MEALS, F& V, SOME WATER, CRYSTAL LIGHT, NO FAST FOOd  Review of Systems General:  Complains of fatigue; denies fever. CV:  Denies chest pain or discomfort. Resp:  Denies shortness of breath. GI:  Denies abdominal pain, bloody stools, constipation, and diarrhea. GU:  Denies dysuria.  Physical Exam  General:  overweight male in NAD  Mouth:  MMM Neck:  no carotid bruit or thyromegaly no cervical or supraclavicular lymphadenopathy   Lungs:  Normal respiratory effort, chest expands symmetrically. Lungs are clear to auscultation, no crackles or wheezes. Heart:  Normal rate and regular rhythm. S1 and S2 normal without gallop, murmur, click, rub or other extra sounds. Abdomen:  Bowel sounds positive,abdomen soft and non-tender without masses, organomegaly or hernias noted. Pulses:  R and L posterior tibial pulses are full and equal bilaterally  Skin:  8 1/2 cm by 12 cm erythema, warmth and swelling in left upper arm.  Diabetes Management Exam:    Foot Exam (with socks and/or shoes not present):       Sensory-Pinprick/Light touch:          Left medial foot (L-4): normal          Left dorsal foot (L-5): normal          Left lateral foot (S-1): normal          Right medial foot (L-4): normal          Right dorsal foot (L-5): normal          Right lateral foot (S-1): normal       Sensory-Monofilament:          Left foot: normal  Right foot: normal       Inspection:          Left foot: normal          Right foot: normal       Nails:          Left foot: normal          Right foot: normal    Eye Exam:       Eye Exam done elsewhere          Date: 01/25/2009          Results: cataract          Done by: eye MD   Impression & Recommendations:  Problem # 1:  CELLULITIS AND ABSCESS OF UPPER ARM AND FOREARM (ICD-682.3) Treat with keflex three times a day. Call if redness spreading or fever.  The following medications were removed from the medication list:    Avelox 400 Mg Tabs (Moxifloxacin hcl) .[name]... 1 tab by mouth daily  x 5 days His updated medication list for this problem includes:    Keflex 750 Mg Caps (Cephalexin) .[name]... 1 tab  by mouth three times a day x 10 days  Orders: TLB-CBC Platelet - w/Differential (85025-CBCD)  Problem # 2:  DM (ICD-250.00) Poor control...? due to recent prednisone +/- cellulitis. Treat as above...call if not improving as expected.  If A1C is reflecting elevations...will at  least temporarily add [place] (pt has financial constraints) His updated medication list for this problem includes:    Metformin Hcl 1000 Mg Tabs (Metformin hcl) .[name]..[name] Two times a day    Glipizide Xl 10 Mg Xr24h-tab (Glipizide) .[name]... Take 1 tablet by mouth once a day  Orders: TLB-BMP (Basic Metabolic Panel-BMET) (80048-METABOL) TLB-Hepatic/Liver Function Pnl (80076-HEPATIC) TLB-A1C / Hgb A1C (Glycohemoglobin) (83036-A1C)  Problem # 3:  HYPERCHOLESTEROLEMIA (ICD-272.0) Due for 6 month eval.  His updated medication list for this problem includes:    Fenofibrate 160 Mg Tabs (Fenofibrate) .[name]... Take 1 tablet by mouth once a day    Simvastatin 80 Mg Tabs (Simvastatin) .[name]... Take 1 tablet by mouth once a day  Orders: TLB-Lipid Panel (80061-LIPID)  Complete Medication List: 1)  Clorazepate Dipotassium 3.75 Mg Tabs (Clorazepate dipotassium) .... Take 1 tablet by mouth twice a day 2)  Atenolol 50 Mg Tabs (Atenolol) .... Once daily 3)  Metformin Hcl 1000 Mg Tabs (Metformin hcl) .... Two times a day 4)  Verapamil Hcl Cr 180 Mg Cp24 (Verapamil hcl) .... Once daily 5)  Warfarin Sodium 2.5 Mg Tabs (Warfarin sodium) .... As directed daily 6)  Fluoxetine Hcl 40 Mg Caps (Fluoxetine hcl) .... Once daily 7)  Fenofibrate 160 Mg Tabs (Fenofibrate) .... Take 1 tablet by mouth once a day 8)  Indomethacin 50 Mg Caps (Indomethacin) .... Every 8 hours as needed 9)  Lasix 20 Mg Tabs (Furosemide) .... Take 1 tablet by mouth once a day by mouth as needed swelling 10)  Simvastatin 80 Mg Tabs (Simvastatin) .... Take 1 tablet by mouth once a day 11)  Fish Oil Concentrate 1000 Mg Caps (Omega-3 fatty acids) .... Take 2 capsule by mouth two times a day 12)  Ascensia Autodisc Test Strp (Glucose blood) .... Test 1-2 times per day 13)  Clobetasol Propionate 0.05 % Soln (Clobetasol propionate) .... Apply once a day as needed 14)  Triamcinolone Acetonide 0.025 % Crea (Triamcinolone acetonide) .... Apply once a day as  needed 15)  Omeprazole 40 Mg Cpdr (Omeprazole) .[name].. 1 tab by mouth two  times a day 16)  Glipizide Xl 10 Mg Xr24h-tab (Glipizide) .... Take 1 tablet by mouth once a day 17)  Proair Hfa 108 (90 Base) Mcg/act Aers (Albuterol sulfate) .... 2 puffs inhaled every 6 hours as needed wheeze. 18)  Fluticasone Propionate 50 Mcg/act Susp (Fluticasone propionate) .... 2 sprays per nostril daily 19)  Bayer Breeze 2 Test Disk (Glucose blood) .... Check cbg 2 times daily dx 250.01 20)  Keflex 750 Mg Caps (Cephalexin) .[name].. 1 tab  by mouth three times a day x 10 days  Patient Instructions: 1)  Start antibiotics. 2)  Continue to follow blood sugars at home. Call if continueing to treand high despite treatment of skin infection.  3)  Please schedule a follow-up appointment in 3 months .  4)  HgBA1c prior to visit  ICD-9: 250.00 5)  Call if redness spreading or fever.  Prescriptions: KEFLEX 750 MG CAPS (CEPHALEXIN) 1 tab  by mouth three times a day x 10 days  #30 x 0   Entered and Authorized by:   [name] MD   Signed by:   [name] MD on 02/15/2009   Method used:   Print then Give to Patient   RxID:   [id number] CLORAZEPATE DIPOTASSIUM 3.75 MG TABS (CLORAZEPATE DIPOTASSIUM) Take 1 tablet by mouth twice a day  #60 x 0   Entered and Authorized by:   [name] MD   Signed by:   [name] MD on 02/15/2009   Method used:   Print then Give to Patient   RxID:   [id number]   Current Allergies (reviewed today): ! SULFACETAMIDE SODIUM (SULFACETAMIDE SODIUM)

## 2010-03-11 NOTE — Letter (Signed)
Summary: Consult/[place]  Consult/[place]   Imported By: Sherian Rein 07/31/2009 12:50:20  _____________________________________________________________________  External Attachment:    Type:   Image     Comment:   External Document

## 2010-03-11 NOTE — Assessment & Plan Note (Signed)
Summary: ROA FOR 1 MONTH FOLLOW-UP   Vital Signs:  Patient profile:   70 year old male Height:      74 inches Weight:      269.4 pounds BMI:     34.71 Temp:     97.7 degrees F oral Pulse rate:   82 / minute Pulse rhythm:   regular BP sitting:   120 / 70  (left arm) Cuff size:   large  Vitals Entered By: Benny Lennert CMA [facility] Dull) (July 12, 2009 8:50 AM)  History of Present Illness: Chief complaint 1 month follow up   DM...recetn poor control due to prednisone for COPD/asthma flare ups.  Now on highest dose of Byetta but only started last week. 4 lb weight loss. Off prednisone x 2 weeks. But left shoulder injection...last week.  FBS in last few days 269-279.  Feels like the Byetta makes him hot.  High cholesterol... LDL at goal on current meds.   Limited activity due to orthopedic issues.  Hypertension History:      Well controlled. [name]        Positive major cardiovascular risk factors include male age 65 years old or older, diabetes, hyperlipidemia, and hypertension.  Negative major cardiovascular risk factors include non-tobacco-user status.     Problems Prior to Update: 1)  Cellulitis and Abscess of Upper Arm and Forearm  (ICD-682.3) 2)  Allergic Rhinitis Cause Unspecified  (ICD-477.9) 3)  Uri  (ICD-465.9) 4)  Asthma, Intrinsic, With Acute Exacerbation  (ICD-493.12) 5)  Chest Xray, Abnormal  (ICD-793.1) 6)  Pneumonia  (ICD-486) 7)  Special Screening Malignant Neoplasm of Prostate  (ICD-V76.44) 8)  Hydronephrosis, Right  (ICD-591) 9)  Encounter For Long-term Use of Other Medications  (ICD-V58.69) 10)  Foot Surgery, Hx of  (ICD-V15.89) 11)  Knee Replacement, Left, Hx of  (ICD-V43.65) 12)  Encounter For Therapeutic Drug Monitoring  (ICD-V58.83) 13)  Aftercare, Long-term Use, Anticoagulants  (ICD-V58.61) 14)  Herpes Zoster W/nervous Complication Nec  (ICD-053.19) 15)  Diverticulosis, Colon  (ICD-562.10) 16)  Pulmonary Embolism, Dvt  (ICD-415.19) 17)  Restless Leg  Syndrome  (ICD-333.94) 18)  Obesity, Bmi 35  (ICD-278.00) 19)  Irritable Bowel Syndrome  (ICD-564.1) 20)  Gastroparesis  (ICD-536.3) 21)  Hypertension  (ICD-401.9) 22)  Low Back Pain, Chronic  (ICD-724.2) 23)  Depression  (ICD-311) 24)  Anxiety  (ICD-300.00) 25)  Asthma, Persistent, Mild  (ICD-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 Take 1 Tablet Daily As Needed Anxiery Limit Use 2)  Atenolol 50 Mg Tabs (Atenolol) .... Once Daily 3)  Metformin Hcl 1000 Mg Tabs (Metformin Hcl) .... Two Times A Day 4)  Calan Sr 180 Mg Cr-Tabs (Verapamil Hcl) .... Take 1 Tablet By Mouth Once A Day 5)  Warfarin Sodium 2.5 Mg Tabs (Warfarin Sodium) .... As Directed Daily 6)  Fluoxetine Hcl 40 Mg Caps (Fluoxetine Hcl) .... Once Daily 7)  Fenofibrate 160 Mg Tabs (Fenofibrate) .... Take 1 Tablet By Mouth Once A Day 8)  Indomethacin 50 Mg  Caps (Indomethacin) .... Every 8 Hours As Needed 9)  Lasix 20 Mg  Tabs (Furosemide) .... Take 1 Tablet By Mouth Once A Day By Mouth As Needed Swelling 10)  Simvastatin 80 Mg Tabs (Simvastatin) .... Take 1 Tablet By Mouth Once A Day 11)  Fish Oil Concentrate 1000 Mg  Caps (  Omega-3 Fatty Acids) .... Take 2 Capsule By Mouth Two Times A Day 12)  Ascensia Autodisc Test   Strp (Glucose Blood) .... Test 1-2 Times Per Day 13)  Clobetasol Propionate 0.05 %  Soln (Clobetasol Propionate) .... Apply Once A Day As Needed 14)  Triamcinolone Acetonide 0.025 %  Crea (Triamcinolone Acetonide) .... Apply Once A Day As Needed 15)  Omeprazole 40 Mg Cpdr (Omeprazole) .[name].. 1 Tab By Mouth Two Times A Day 16)  Glipizide Xl 10 Mg Xr24h-Tab (Glipizide) .... Take 1 Tablet By Mouth Once A Day 17)  Bayer Breeze 2 Test  Disk (Glucose Blood) .... Check Cbg 2 Times Daily Dx 250.01 18)   Byetta 10 Mcg Pen 10 Mcg/0.6ml Soln (Exenatide) .[name].. 1 Inj Subcutaneously Two Times A Day 19)  Qc Pen Needles 31g X 8 Mm Misc (Insulin Pen Needle) .... Use 1 Needle 2 Times Daily 20)  Advair Diskus 250-50 Mcg/dose Aepb (Fluticasone-Salmeterol) .[name].. 1 Puff Two Times A Day  Allergies: 1)  ! Sulfacetamide Sodium (Sulfacetamide Sodium)  Past History:  Past medical, surgical, family and social histories (including risk factors) reviewed, and no changes noted (except as noted below).  Past Medical History: Reviewed history from 05/23/2008 and no changes required. 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 Father: DIED 26 + MI, EMPHYSEMA Mother: DIED 44, PNA, ? UTERINE CA, METASTATIC Siblings: 4 BROTHERS, MI AGE 44, PROSTATE CA 1 SISTER ALZHEIMER'S, PNA NO MI < 55  Social History: Reviewed history from 05/23/2008 and no changes required. Marital Status: Married X 25 YEARS Children: 1 DAUGHTER, HEALTHY Occupation:  RETIRED TRUCK DRIVER EXERCISE:  WALKS TREADMILL 30 MINUTES DIET:  3 MEALS, F& V, SOME WATER, CRYSTAL LIGHT, NO FAST FOOd  Review of Systems General:  Denies fatigue and fever. CV:  Denies chest pain or discomfort. Resp:  Denies shortness of breath. GI:  Denies abdominal pain. GU:  Denies dysuria.  Physical Exam  General:  Well-developed,well-nourished,in no acute distress; alert,appropriate and cooperative throughout examination Ears:  no external deformities.   Nose:  no external deformity.   Mouth:  Oral mucosa and oropharynx without lesions or exudates.  Teeth in good repair. Neck:  No deformities, masses, or tenderness noted. Lungs:  normal respiratory effort, improved aitrmovement threough out, rare exp wheeze, prolongued exp phase.  Heart:  Normal rate and regular rhythm. S1 and S2 normal without gallop, murmur, click, rub or other extra sounds. Abdomen:  Bowel sounds positive,abdomen soft and non-tender without masses, organomegaly or hernias noted. Pulses:  R and L posterior tibial pulses are full and equal bilaterally  Extremities:  B varicose veins Skin:  Intact without suspicious lesions or rashes   Impression & Recommendations:  Problem # 1:  HYPERTENSION (ICD-401.9)  Well controlled. Continue current medication.  His updated medication list for this problem includes:  Atenolol 50 Mg Tabs (Atenolol) ..... Once daily    Calan Sr 180 Mg Cr-tabs (Verapamil hcl) .[name]... Take 1 tablet by mouth once a day    Lasix 20 Mg Tabs (Furosemide) .[name]... Take 1 tablet by mouth once a day by mouth as needed swelling  BP today: 120/70 Prior BP: 120/70 (06/25/2009)  Prior 10 Yr Risk Heart Disease: Not enough information (07/03/2008)  Labs Reviewed: K+: 4.3 (07/09/2009) Creat: : 1.0 (07/09/2009)   Chol: 194 (07/09/2009)   HDL: 44.30 (07/09/2009)   LDL: DEL (03/28/2008)   TG: 384.0 (07/09/2009)  Problem # 2:  HYPERCHOLESTEROLEMIA (ICD-272.0) Triglycerides poor control..LDL at  goal. ON 3 medicaiotns. Needs exercsie and weight loss.  His updated medication list for this problem includes:    Fenofibrate 160 Mg Tabs (Fenofibrate) .[name]... Take 1 tablet by mouth once a day    Simvastatin 80 Mg Tabs (Simvastatin) .[name]... Take 1 tablet by mouth once a day  Problem # 3:  DM (ICD-250.00) Very poor control..wishes to continue hogher dose Byetta.Marland Kitchengiven weight loss...FBS was coming down before shoulder injection. If not back at goal in next 3 months..will cahnge to insulin.   Complete Medication List: 1)  Clorazepate Dipotassium 3.75 Mg Tabs (Clorazepate dipotassium) .... Take 1 tablet daily as needed anxiery limit use 2)  Atenolol 50 Mg Tabs (Atenolol) .... Once daily 3)  Metformin Hcl 1000 Mg Tabs (Metformin hcl) .... Two times a day 4)  Calan Sr 180 Mg Cr-tabs (Verapamil hcl) .... Take 1 tablet by mouth once a day 5)  Warfarin Sodium 2.5 Mg Tabs (Warfarin sodium) .... As directed daily 6)  Fluoxetine Hcl 40 Mg Caps (Fluoxetine hcl) .... Once daily 7)  Fenofibrate 160 Mg Tabs (Fenofibrate) .... Take 1 tablet by mouth once a day 8)  Indomethacin 50 Mg Caps (Indomethacin) .... Every 8 hours as needed 9)  Lasix 20 Mg Tabs (Furosemide) .... Take 1 tablet by mouth once a day by mouth as needed swelling 10)  Simvastatin 80 Mg Tabs (Simvastatin) .... Take 1 tablet by mouth once a day 11)  Fish Oil Concentrate 1000 Mg Caps (Omega-3 fatty acids) .... Take 2 capsule by mouth two times a day 12)  Ascensia Autodisc Test Strp (Glucose blood) .... Test 1-2 times per day 13)  Clobetasol Propionate 0.05 % Soln (Clobetasol propionate) .... Apply once a day as needed 14)  Triamcinolone Acetonide 0.025 % Crea (Triamcinolone acetonide) .... Apply once a day as needed 15)  Omeprazole 40 Mg Cpdr (Omeprazole) .[name].. 1 tab by mouth two times a day 16)  Glipizide Xl 10 Mg Xr24h-tab (Glipizide) .... Take 1 tablet by mouth once a day 17)  Bayer Breeze 2 Test Disk (Glucose blood) .... Check cbg 2 times  daily dx 250.01 18)  Byetta 10 Mcg Pen 10 Mcg/0.48ml Soln (Exenatide) .[name].. 1 inj subcutaneously two times a day 19)  Qc Pen Needles 31g X 8 Mm Misc (Insulin pen needle) .... Use 1 needle 2 times daily 20)  Advair Diskus 250-50 Mcg/dose Aepb (Fluticasone-salmeterol) .[name].. 1 puff two times a day  Hypertension Assessment/Plan:      The patient's hypertensive risk group is category C: Target organ damage and/or diabetes.  Today's blood pressure is 120/70.  His blood pressure goal is < 130/80.  Patient Instructions: 1)  Continue Byetta. 2)  Please schedule a follow-up appointment in 3 months .  3)  BMP prior to visit, ICD-9:  4)  Hepatic Panel prior to visit ICD-9:  5)  Lipid panel  prior to visit ICD-9 :  6)  HgBA1c prior to visit  ICD-9:   Current Allergies (reviewed today): ! SULFACETAMIDE SODIUM (SULFACETAMIDE SODIUM)

## 2010-03-11 NOTE — Assessment & Plan Note (Signed)
Summary: ? BRONCHITIS/ 10:30 PER HEATHER   Vital Signs:  Patient profile:   70 year old male Height:      74 inches Weight:      284 pounds BMI:     36.60 O2 Sat:      99 % Temp:     97.6 degrees F oral Pulse rate:   77 / minute Pulse rhythm:   regular Resp:     20 per minute BP sitting:   112 / 80  (left arm) Cuff size:   large  Vitals Entered By: Benny Lennert CMA Duncan Dull) (Jun 11, 2009 10:32 AM)  History of Present Illness: Chief complaint ? bronchitis  Poor control DM.Marland Kitchenglucose 160-230 No improvement with JAnuvia.   Now inactive due to left foot stress fracture.   Acute Visit History:      The patient complains of cough, nasal discharge, and sore throat.  These symptoms began 5 days ago.  He denies diarrhea, earache, fever, and sinus problems.  Other comments include: Using mucinex.   Using albuterol 2-3 times a day. swetaqting, chills. .        The patient notes wheezing and shortness of breath.  The cough interferes with his sleep.  The character of the cough is described as productive yellow and white.        Problems Prior to Update: 1)  Cellulitis and Abscess of Upper Arm and Forearm  (ICD-682.3) 2)  Allergic Rhinitis Cause Unspecified  (ICD-477.9) 3)  Uri  (ICD-465.9) 4)  Asthma, Intrinsic, With Acute Exacerbation  (ICD-493.12) 5)  Chest Xray, Abnormal  (ICD-793.1) 6)  Pneumonia  (ICD-486) 7)  Special Screening Malignant Neoplasm of Prostate  (ICD-V76.44) 8)  Hydronephrosis, Right  (ICD-591) 9)  Encounter For Long-term Use of Other Medications  (ICD-V58.69) 10)  Foot Surgery, Hx of  (ICD-V15.89) 11)  Knee Replacement, Left, Hx of  (ICD-V43.65) 12)  Encounter For Therapeutic Drug Monitoring  (ICD-V58.83) 13)  Aftercare, Long-term Use, Anticoagulants  (ICD-V58.61) 14)  Herpes Zoster W/nervous Complication Nec  (ICD-053.19) 15)  Diverticulosis, Colon  (ICD-562.10) 16)  Pulmonary Embolism, Dvt  (ICD-415.19) 17)  Restless Leg Syndrome  (ICD-333.94) 18)   Obesity, Bmi 35  (ICD-278.00) 19)  Irritable Bowel Syndrome  (ICD-564.1) 20)  Gastroparesis  (ICD-536.3) 21)  Hypertension  (ICD-401.9) 22)  Low Back Pain, Chronic  (ICD-724.2) 23)  Depression  (ICD-311) 24)  Anxiety  (ICD-300.00) 25)  Asthma, Persistent, Mild  (ICD-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 Take 1 Tablet By Mouth Twice A Day 2)  Atenolol 50 Mg Tabs (Atenolol) .... Once Daily 3)  Metformin Hcl 1000 Mg Tabs (Metformin Hcl) .... Two Times A Day 4)  Calan Sr 180 Mg Cr-Tabs (Verapamil Hcl) .... Take 1 Tablet By Mouth Once A Day 5)  Warfarin Sodium 2.5 Mg Tabs (Warfarin Sodium) .... As Directed Daily 6)  Fluoxetine Hcl 40 Mg Caps (Fluoxetine Hcl) .... Once Daily 7)  Fenofibrate 160 Mg Tabs (Fenofibrate) .... Take 1 Tablet By Mouth Once A Day 8)  Indomethacin 50 Mg  Caps (Indomethacin) .... Every 8 Hours As Needed 9)  Lasix 20 Mg  Tabs (Furosemide) .... Take 1 Tablet By Mouth Once A Day By Mouth As Needed Swelling 10)  Simvastatin 80 Mg Tabs (Simvastatin) .... Take 1 Tablet By Mouth Once A  Day 11)  Fish Oil Concentrate 1000 Mg  Caps (Omega-3 Fatty Acids) .... Take 2 Capsule By Mouth Two Times A Day 12)  Ascensia Autodisc Test   Strp (Glucose Blood) .... Test 1-2 Times Per Day 13)  Clobetasol Propionate 0.05 %  Soln (Clobetasol Propionate) .... Apply Once A Day As Needed 14)  Triamcinolone Acetonide 0.025 %  Crea (Triamcinolone Acetonide) .... Apply Once A Day As Needed 15)  Omeprazole 40 Mg Cpdr (Omeprazole) .[name].. 1 Tab By Mouth Two Times A Day 16)  Glipizide Xl 10 Mg Xr24h-Tab (Glipizide) .... Take 1 Tablet By Mouth Once A Day 17)  Bayer Breeze 2 Test  Disk (Glucose Blood) .... Check Cbg 2 Times Daily Dx 250.01 18)  Fish Oil   Oil (Fish Oil) .... 2 By Mouth  Daily 19)  Byetta 5 Mcg Pen 5 Mcg/0.72ml Soln (Exenatide) .[name].. 1 Injection Subcutaneously Two Times A Day 20)  Prednisone 20 Mg Tabs (Prednisone) .... 3 Tabs By Mouth Daily X 3 Days, Then 2 Tabs By Mouth Daily X 2 Days Then 1 Tab By Mouth Daily X 2 Days 21)  Levaquin 500 Mg Tabs (Levofloxacin) .[name].. 1 Tab By Mouth Daily X 7 Days 22)  Qc Pen Needles 31g X 8 Mm Misc (Insulin Pen Needle) .... Use 1 Needle 2 Times Daily  Allergies: 1)  ! Sulfacetamide Sodium (Sulfacetamide Sodium)  Past History:  Past medical, surgical, family and social histories (including risk factors) reviewed, and no changes noted (except as noted below).  Past Medical History: Reviewed history from 05/23/2008 and no changes required. 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 Father: DIED 66 + MI, EMPHYSEMA Mother: DIED 67, PNA, ? UTERINE CA, METASTATIC Siblings: 4 BROTHERS, MI AGE 58, PROSTATE CA 1 SISTER ALZHEIMER'S, PNA NO MI < 55  Social  History: Reviewed history from 05/23/2008 and no changes required. Marital Status: Married X 25 YEARS Children: 1 DAUGHTER, HEALTHY Occupation: RETIRED TRUCK DRIVER EXERCISE:  WALKS TREADMILL 30 MINUTES DIET:  3 MEALS, F& V, SOME WATER, CRYSTAL LIGHT, NO FAST FOOd  Review of Systems General:  Complains of fatigue and fever. CV:  Denies chest pain or discomfort. Resp:  Complains of shortness of breath, sputum productive, and wheezing. GI:  Denies abdominal pain. GU:  Denies dysuria.  Physical Exam  General:  overweight male in NAD  Eyes:  No corneal or conjunctival inflammation noted. EOMI. Perrla. Funduscopic exam benign, without hemorrhages, exudates or papilledema. Vision grossly normal. Ears:  clear fluid B TMS Nose:  nasal dischargemucosal pallor.   Mouth:  MMM Neck:  no carotid bruit or thyromegaly no cervical or supraclavicular lymphadenopathy  Lungs:  Diffuse wheezing, short of breath with talking Had nebulizer at home.  Heart:  Normal rate and regular rhythm. S1 and S2 normal without gallop, murmur, click, rub or other extra  sounds. Abdomen:  Bowel sounds positive,abdomen soft and non-tender without masses, organomegaly or hernias noted. Pulses:  R and L posterior tibial pulses are full and equal bilaterally  Extremities:  no edema   Impression & Recommendations:  Problem # 1:  ASTHMA, INTRINSIC, WITH ACUTE EXACERBATION (ICD-493.12) Poor control.Marland KitchenMarland KitchenAlbuterol nebs at home.Marland Kitchengo to ER if requiring more than one every 2 hours.  Start antibitoic, prednisone taper.  His updated medication list for this problem includes:    Prednisone 20 Mg Tabs (Prednisone) .Marland KitchenMarland KitchenMarland KitchenMarland Kitchen 3 tabs by mouth daily x 3 days, then 2 tabs by mouth daily x 2 days then 1 tab by mouth daily x 2 days  Problem # 2:  DM (ICD-250.00)  Poor control...start Byetta..Move follow up appt and labs back some. Encouraged exercise, weight loss, healthy eating habits.  His updated medication list for this problem  includes:    Metformin Hcl 1000 Mg Tabs (Metformin hcl) .[name]..[name] Two times a day    Glipizide Xl 10 Mg Xr24h-tab (Glipizide) .[name]... Take 1 tablet by mouth once a day    Byetta 5 Mcg Pen 5 Mcg/0.40ml Soln (Exenatide) .[name]... 1 injection subcutaneously two times a day  Labs Reviewed: Creat: 1.3 (02/15/2009)     Last Eye Exam: cataract (01/25/2009) Reviewed HgBA1c results: 8.2 (05/17/2009)  9.3 (02/15/2009)  Complete Medication List: 1)  Clorazepate Dipotassium 3.75 Mg Tabs (Clorazepate dipotassium) .... Take 1 tablet by mouth twice a day 2)  Atenolol 50 Mg Tabs (Atenolol) .... Once daily 3)  Metformin Hcl 1000 Mg Tabs (Metformin hcl) .... Two times a day 4)  Calan Sr 180 Mg Cr-tabs (Verapamil hcl) .... Take 1 tablet by mouth once a day 5)  Warfarin Sodium 2.5 Mg Tabs (Warfarin sodium) .... As directed daily 6)  Fluoxetine Hcl 40 Mg Caps (Fluoxetine hcl) .... Once daily 7)  Fenofibrate 160 Mg Tabs (Fenofibrate) .... Take 1 tablet by mouth once a day 8)  Indomethacin 50 Mg Caps (Indomethacin) .... Every 8 hours as needed 9)  Lasix 20 Mg Tabs (Furosemide) .... Take 1 tablet by mouth once a day by mouth as needed swelling 10)  Simvastatin 80 Mg Tabs (Simvastatin) .... Take 1 tablet by mouth once a day 11)  Fish Oil Concentrate 1000 Mg Caps (Omega-3 fatty acids) .... Take 2 capsule by mouth two times a day 12)  Ascensia Autodisc Test Strp (Glucose blood) .... Test 1-2 times per day 13)  Clobetasol Propionate 0.05 % Soln (Clobetasol propionate) .... Apply once a day as needed 14)  Triamcinolone Acetonide 0.025 % Crea (Triamcinolone acetonide) .... Apply once a day as needed 15)  Omeprazole 40 Mg Cpdr (Omeprazole) .[name].. 1 tab by mouth two times a day 16)  Glipizide Xl 10 Mg Xr24h-tab (Glipizide) .... Take 1 tablet by mouth once a day 17)  Bayer Breeze 2 Test Disk (Glucose blood) .... Check cbg 2 times daily dx 250.01 18)  Fish Oil Oil (Fish oil) .... 2 by mouth daily 19)  Byetta 5 Mcg Pen 5 Mcg/0.36ml  Soln (Exenatide) .[name].. 1 injection subcutaneously two times a day 20)  Prednisone 20 Mg Tabs (Prednisone) .... 3 tabs by mouth daily x 3 days, then 2 tabs by mouth daily x 2 days then 1 tab by mouth daily x 2 days 21)  Levaquin 500 Mg Tabs (Levofloxacin) .[name].. 1 tab by mouth daily x 7 days 22)  Qc Pen Needles 31g X 8 Mm Misc (Insulin pen needle) .... Use 1 needle 2 times daily  Patient Instructions: 1)  Start Byetta two times a day at 5 micrograms..call if blood sugars not improving. 2)  Start antibiotics anbd prednisone course. 3)  Move labs and appt back x 1 month. Prescriptions: QC PEN NEEDLES 31G X 8 MM MISC (INSULIN PEN NEEDLE) use 1 needle 2 times daily  #60 x 5   Entered by:   Benny Lennert CMA (AAMA)   Authorized by:   [name] MD   Signed by:   Benny Lennert CMA (AAMA) on 06/12/2009   Method used:   [facility]        [facility]  [place] [id number]       [address]       [place], [place]  [address]       Ph: [phone] or [phone]       Fax: [phone]   RxID:   [id number] LEVAQUIN 500 MG TABS (LEVOFLOXACIN) 1 tab by mouth daily x 7 days  #7 x 0   Entered and Authorized by:   [name] MD   Signed by:   [name] MD on 06/11/2009   Method used:   [facility]        [facility]  [place] [id number]       [address]       [place], [place]  [address]       Ph: [phone] or [phone]       Fax: [phone]   RxID:   [id number] PREDNISONE 20 MG TABS (PREDNISONE) 3 tabs by mouth daily x 3 days, then 2 tabs by mouth daily x 2 days then 1 tab by mouth daily x 2 days  #15 x 0   Entered and Authorized by:   [name] MD   Signed by:   [name] MD on 06/11/2009   Method used:   [facility]        [facility]  [place] [id number]       [address]       [place], [place]  [address]       Ph: [phone] or [phone]       Fax: [phone]   RxID:   [id number] BYETTA 5 MCG PEN 5 MCG/0.02ML  SOLN (EXENATIDE) 1 injection Subcutaneously two times a day  #1 box x 11   Entered and Authorized by:   [name] MD   Signed by:   [name] MD on 06/11/2009   Method used:   [facility]        [facility]  [place] (retail)       [address]       [place], [place]  [address]       Ph: [phone] or [phone]       Fax: [phone]   RxID:   [id number]   Current Allergies (reviewed today): ! SULFACETAMIDE SODIUM (SULFACETAMIDE SODIUM)

## 2010-03-11 NOTE — Letter (Signed)
Summary: [name]   Imported By: Maryln Gottron 12/12/2009 15:58:06  _____________________________________________________________________  External Attachment:    Type:   Image     Comment:   External Document  Appended Document: [name] enter..lipids, Hg, TSH, creatinine, A1C and  AST/ALT, MICROALBUMIN  if present into EMR.  Thanks.   Appended Document: Theatre stage manager at [facility] done

## 2010-03-11 NOTE — Progress Notes (Signed)
Summary: pulm rehab at [facility]  Phone Note Call from Patient Call back at [facility] Phone [phone]   Caller: Patient Call For: ramaswamy Summary of Call: States MR was supposed to set him up with rehab therapy at [facility] over a week ago, hasn't heard anything, pls advise. Initial call taken by: Darletta Moll,  November 21, 2009 10:42 AM  Follow-up for Phone Call        called and spoke with pt.  pt states he hasn't heard from [facility] about getting set up for pulm rehab.  Reviewed orders in EMR.  MR sent order to [facility] for pulmonary rehab in [place]..however, it looks like orders were sent to [facility] instead.  These orders should have gone to [facility].  Will forward message to [facility] to address. Aundra Millet Reynolds LPN  November 21, 2009 10:58 AM    ( Pt is aware it takes 4 to 6 weeks before we hear back from pulm rehab. pt verbalized understanding).    Additional Follow-up for Phone Call Additional follow up Details #1::        refaxed orders to [facility] for pul rehab there  Additional Follow-up by: Oneita Jolly,  November 21, 2009 11:28 AM

## 2010-03-11 NOTE — Progress Notes (Signed)
Summary: asking for vicodin refill today  Phone Note Call from Patient Call back at [phone]   Caller: Spouse Summary of Call: Pt's wife is asking that pt's vicodin be filled today.  He fell today and is in a lot of pain.  He wont have enough to last the night unless he can get this refiled.  Please let his wife know. Initial call taken by: Lowella Petties CMA,  October 10, 2009 3:46 PM  Follow-up for Phone Call        [facility] [place] [facility] gail advised Follow-up by: Benny Lennert CMA Duncan Dull),  October 10, 2009 4:18 PM    Prescriptions: HYDROCODONE-ACETAMINOPHEN 10-325 MG TABS (HYDROCODONE-ACETAMINOPHEN) 1-2 every 4-6 hours as needed MAX acetominophen is 4000 mg daily  #100 x 0   Entered and Authorized by:   Hannah Beat MD   Signed by:   Hannah Beat MD on 10/10/2009   Method used:   Telephoned to ...       [facility]* (mail-order)       [address]       [place],   [address]       Ph: [phone]       Fax: [phone]   RxID:   [id number]

## 2010-03-11 NOTE — Progress Notes (Signed)
Summary: has cold sxs  Phone Note Call from Patient Call back at Home Phone [phone]   Caller: Patient Summary of Call: Pt has productive cough with clear mucous, runny nose.  No fever, no sore throat.  He is requesting that [facility] be [facility]. Initial call taken by: Lowella Petties CMA, AAMA,  December 09, 2009 8:29 AM  Follow-up for Phone Call        Exceedingly complicated medical case with prior respiratory failure, tracheostomy, prolonged hospitalization.   Evaluation needed. Hannah Beat MD  December 09, 2009 8:40  Advised pt , appt made. Follow-up by: Lowella Petties CMA, AAMA,  December 09, 2009 9:01 AM

## 2010-03-11 NOTE — Consult Note (Signed)
Summary: [facility]   Imported By: Lanelle Bal 09/09/2009 10:10:53  _____________________________________________________________________  External Attachment:    Type:   Image     Comment:   External Document

## 2010-03-11 NOTE — Assessment & Plan Note (Signed)
Summary: NOT FEELING WELL/DLO   Vital Signs:  Patient profile:   70 year old male Height:      74 inches Weight:      256.0 pounds BMI:     32.99 Temp:     97.9 degrees F oral Pulse rate:   96 / minute Pulse rhythm:   regular BP sitting:   118 / 84  (left arm) Cuff size:   large  Vitals Entered By: Benny Lennert CMA Duncan Dull) (November 05, 2009 2:29 PM)  History of Present Illness: Chief complaint problems voiding  In past 2 days.Marland Kitchendecrease in stream of urine. Intermittant dribbling, then next time good flow. Increase in frequency, some urinary urgency. Slight urinary retention..mostly feels like can empty. No abdominal pain, no perineal pain, no dysuria.  No fever. No chills. No new meds.   Breathing improved...has appt with Pulm in 2 days. Completed steroids and antibiotics. Has basically been on steroid all but 5 days since D/C from hospital.   Using narcotics intermittantly...decreased oin past few weeks...low back pain.     Allergies: 1)  ! Sulfacetamide Sodium (Sulfacetamide Sodium)  Physical Exam  General:  Overweight appearing male inNAD Mouth:  Oral mucosa and oropharynx without lesions or exudates.  Dentures MMM Lungs:  Normal respiratory effort, chest expands symmetrically. Lungs are clear to auscultation, no crackles or wheezes. Heart:  Normal rate and regular rhythm. S1 and S2 normal without gallop, murmur, click, rub or other extra sounds. Abdomen:  Bowel sounds positive,abdomen soft and non-tender without masses, organomegaly or hernias noted.  NO CVA tenderness Genitalia:  refused Prostate:  refused perineal exam...no prostate exam given concern of acute infection.   Impression & Recommendations:  Problem # 1:  ACUTE PROSTATITIS (ICD-601.0) Treat with 10 day course of antibiotics. Push fluids. Call if not improving.  Orders: UA Dipstick W/ Micro (manual) (41660) T-Culture, Urine ([id number])  Complete Medication List: 1)  Fluoxetine Hcl  40 Mg Caps (Fluoxetine hcl) .... Once daily 2)  Omeprazole 40 Mg Cpdr (Omeprazole) .[name].. 1 tab by mouth two times a day 3)  Nephrocaps 1 Mg Caps (B complex-c-folic acid) .... One tablet daily 4)  Levemir 100 Unit/ml Soln (Insulin detemir) .[name].. 18 units in am once a day 5)  Ferrous Sulfate 325 (65 Fe) Mg Tabs (Ferrous sulfate) .... Take one tablet 2 times a day 6)  Metoprolol Succinate 25 Mg Xr24h-tab (Metoprolol succinate) .... One tablet by mouth 2 times daily 7)  Clonazepam 0.5 Mg Tabs (Clonazepam) .[name].. 1 trab by mouth every 8 hours as needed for agitation 8)  Novolog 100 Unit/ml Soln (Insulin aspart) .... 6 units with every meal + slinding scale 9)  Hydrocodone-acetaminophen 10-325 Mg Tabs (Hydrocodone-acetaminophen) .[name].. 1-2 every 4-6 hours as needed max acetominophen is 4000 mg daily 10)  Ambien 5 Mg Tabs (Zolpidem tartrate) .... Take 1-2 by mouth at bedtime as needed for insomnia 11)  Warfarin Sodium 2 Mg Tabs (Warfarin sodium) .... Take as directed 12)  Ciprofloxacin Hcl 500 Mg Tabs (Ciprofloxacin hcl) .... Take 1 tablet by mouth every 12 hours x 10 days  Patient Instructions: 1)  Prostate infection...start course of antibiotics...cipro two times a day x 10 days. 2)   Push fluids. 3)   Call if symptoms not improving.  4)  Keep appt with pulm for further recommendations.  Prescriptions: CIPROFLOXACIN HCL 500 MG TABS (CIPROFLOXACIN HCL) Take 1 tablet by mouth every 12 hours x 10 days  #20 x 0   Entered and Authorized by:  [name] MD   Signed by:   [name] MD on 11/05/2009   Method used:   [facility]        [facility]  [place] [id number]       [address]       [place], [place]  [address]       Ph: [phone] or [phone]       Fax: [phone]   RxID:   [id number]   Current Allergies (reviewed today): ! SULFACETAMIDE SODIUM (SULFACETAMIDE SODIUM)  Laboratory Results   Urine Tests  Date/Time Received: November 05, 2009 2:35 PM  Date/Time  Reported: November 05, 2009 2:35 PM   Routine Urinalysis   Color: yellow Appearance: Clear Glucose: negative   (Normal Range: Negative) Bilirubin: negative   (Normal Range: Negative) Ketone: negative   (Normal Range: Negative) Spec. Gravity: 1.020   (Normal Range: 1.003-1.035) Blood: small   (Normal Range: Negative) pH: 6.0   (Normal Range: 5.0-8.0) Protein: trace   (Normal Range: Negative) Urobilinogen: 0.2   (Normal Range: 0-1) Nitrite: negative   (Normal Range: Negative) Leukocyte Esterace: moderate   (Normal Range: Negative)  Urine Microscopic WBC/HPF: tntc RBC/HPF: 3-5 Bacteria/HPF: none Epithelial/HPF: none Casts/LPF: wbc       Appended Document: NOT FEELING WELL/DLO

## 2010-03-11 NOTE — Miscellaneous (Signed)
  Clinical Lists Changes  Observations: Added new observation of HGBA1C: 6.4 % (12/04/2009 16:18)

## 2010-03-11 NOTE — Letter (Signed)
Summary: [facility]   Imported By: Lanelle Bal 07/18/2009 09:56:33  _____________________________________________________________________  External Attachment:    Type:   Image     Comment:   External Document

## 2010-03-11 NOTE — Assessment & Plan Note (Signed)
Summary: WEEZING REAL BAD AND COUGHING/JRR   Vital Signs:  Patient profile:   70 year old male Height:      74 inches Weight:      256.0 pounds BMI:     32.99 O2 Sat:      99 % on Room air Temp:     97.7 degrees F oral Pulse rate:   115 / minute Pulse rhythm:   regular Resp:     22 per minute BP sitting:   112 / 80  (left arm) Cuff size:   large  Vitals Entered By: Benny Lennert CMA Duncan Dull) (October 07, 2009 10:33 AM)  O2 Flow:  Room air  History of Present Illness:     s/p recent long ICU stay with intubation, tracheostomy, ARF req dialysis, liver failure, on multiple IV abx in the hospital with complex sepsis,  recent trach removal in past few weeks   Acute Visit History:      The patient complains of cough and fever.  These symptoms began 4 days ago.  He denies abdominal pain, chest pain, headache, nasal discharge, nausea, sinus problems, sore throat, and vomiting.  Other comments include: H/O ASTHMA DISTANTLY, COMPLEX RECENT HISTORY.        The patient notes wheezing.  The cough interferes with his sleep.  There is no history of shortness of breath, respiratory retractions, tachypnea, cyanosis, or interference with oral intake associated with his cough.        Urine output has been normal.  He is tolerating clear liquids.        Allergies: 1)  ! Sulfacetamide Sodium (Sulfacetamide Sodium)  Past History:  Past medical, surgical, family and social histories (including risk factors) reviewed, and no changes noted (except as noted below).  Past Medical History: Reviewed history from 09/02/2009 and no changes required. ICU stay, sepsis,  strep viridans. Status post renal failure, liver failure, tracheostomy, with recovery. 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 Father: DIED 76 + MI, EMPHYSEMA Mother: DIED 30, PNA, ? UTERINE CA, METASTATIC Siblings: 4 BROTHERS, MI AGE 50, PROSTATE CA 1 SISTER ALZHEIMER'S, PNA NO MI < 55  Social History: Reviewed history from 05/23/2008 and no changes required. Marital Status: Married X 25 YEARS Children: 1 DAUGHTER, HEALTHY Occupation: RETIRED TRUCK DRIVER EXERCISE:  WALKS TREADMILL 30 MINUTES DIET:  3 MEALS, F& V, SOME WATER, CRYSTAL LIGHT, NO FAST FOOd  Physical Exam  General:  Overweight appearing male inNAD Head:  Normocephalic and atraumatic without obvious abnormalities. No apparent alopecia or balding. Ears:  no external deformities.   Nose:  no external deformity.   Lungs:  diffuse wheezing without focal crackles, normal respiratory effort, no intercostal retractions, and no accessory muscle use.   Heart:  Normal rate and regular rhythm. S1 and S2 normal without gallop, murmur, click, rub or other extra sounds. Cervical Nodes:  No lymphadenopathy noted Psych:  Cognition and judgment  appear intact. Alert and cooperative with normal attention span and concentration. No apparent delusions, illusions, hallucinations   Impression & Recommendations:  Problem # 1:  BRONCHITIS- ACUTE (ICD-466.0) no focal pna, but complex history, history of asthma distantly treat with avelox and  steroids, albuterol that he has at home  when baseline at f/u, would check pft's  The following medications were removed from the medication list:    Augmentin 875-125 Mg Tabs (Amoxicillin-pot clavulanate) .[name]... Take 2 tablets daily His updated medication list for this problem includes:    Avelox 400 Mg Tabs (Moxifloxacin hcl) .[name]... 1 tablet by mouth daily  Problem # 2:  WHEEZING (ICD-786.07)  Orders: Prescription Created Electronically [phone])  Complete Medication List: 1)  Fluoxetine Hcl 40 Mg Caps (Fluoxetine hcl) .... Once daily 2)  Omeprazole 40 Mg Cpdr (Omeprazole) .[name].. 1 tab by mouth two times a day 3)  Nephrocaps 1 Mg Caps (B complex-c-folic acid) .... One tablet daily 4)  Levemir 100 Unit/ml Soln (Insulin detemir) .[name].. 14 units in am once a day 5)  Ferrous Sulfate 325 (65 Fe) Mg Tabs (Ferrous sulfate) .... Take one tablet 2 times a day 6)  Metoprolol Succinate 25 Mg Xr24h-tab (Metoprolol succinate) .... One tablet by mouth 2 times daily 7)  Clonazepam 0.5 Mg Tabs (Clonazepam) .[name].. 1 trab by mouth every 8 hours as needed for agitation 8)  Novolog 100 Unit/ml Soln (Insulin aspart) .... 6 units with every meal +scale 9)  Hydrocodone-acetaminophen 10-325 Mg Tabs (Hydrocodone-acetaminophen) .[name].. 1-2 every 4-6 hours as needed max acetominophen is 4000 mg daily 10)  Prednisone 20 Mg Tabs (Prednisone) .... 2 tabs by mouth x 5 days, then 1 tab by mouth x 3 days 11)  Avelox 400 Mg Tabs (Moxifloxacin hcl) .[name].. 1 tablet by mouth daily Prescriptions: AVELOX 400 MG  TABS (MOXIFLOXACIN HCL) 1 tablet by mouth daily  #10 x 0   Entered and Authorized by:   Hannah Beat MD   Signed by:   Hannah Beat MD on 10/07/2009   Method used:   [facility]        [facility] [id number]       [address]       [place], [place]  [address]       Ph: [phone] or [phone]       Fax: [phone]   RxID:   [id number] PREDNISONE 20 MG TABS (PREDNISONE) 2 tabs by  mouth x 5 days, then 1 tab by mouth x 3 days  #13 x 0   Entered and Authorized by:   Hannah Beat MD   Signed by:   Hannah Beat MD on 10/07/2009   Method used:   [facility]        [facility] (retail)       [address]       [place], [place]  [address]       Ph: [phone] or [phone]       Fax: [phone]   RxID:   [id number]   Current Allergies (reviewed today): ! SULFACETAMIDE SODIUM (SULFACETAMIDE SODIUM)

## 2010-03-11 NOTE — Assessment & Plan Note (Signed)
Summary: MONDAY F/U WHEEZING PER DR BEDSOLE/NT   Vital Signs:  Patient profile:   70 year old male Height:      74 inches Weight:      273.6 pounds BMI:     35.26 O2 Sat:      97 % on Room air Temp:     97.9 degrees F oral Pulse rate:   80 / minute Pulse rhythm:   regular Resp:     16 per minute BP sitting:   120 / 78  (left arm) Cuff size:   large  Vitals Entered By: Benny Lennert CMA Duncan Dull) (Jun 17, 2009 10:42 AM)  O2 Flow:  Room air  History of Present Illness: Chief complaint monday follow up wheezing per dr Ermalene Searing  70 year old  f/u asthma, bronchitis: doing poorly, on two times a day alb nebs, and 60 mg of prednisone a day. Doing a little better, but still getting SOB. Coughing a lot. Productive of sputum.  on LVQ day 7  Doing nebs twice a day  Sugars have been up a great deal  Allergies: 1)  ! Sulfacetamide Sodium (Sulfacetamide Sodium)  Past History:  Past medical, surgical, family and social histories (including risk factors) reviewed, and no changes noted (except as noted below).  Past Medical History: Reviewed history from 05/23/2008 and no changes required. 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 Father: DIED 103 + MI, EMPHYSEMA Mother: DIED 70, PNA, ? UTERINE CA, METASTATIC Siblings: 4 BROTHERS, MI AGE 59, PROSTATE CA 1 SISTER ALZHEIMER'S, PNA NO MI < 55  Social History: Reviewed history from 05/23/2008 and no changes required. Marital Status: Married X 25 YEARS Children: 1 DAUGHTER, HEALTHY Occupation: RETIRED TRUCK DRIVER EXERCISE:  WALKS TREADMILL 30 MINUTES DIET:  3 MEALS, F& V, SOME WATER, CRYSTAL LIGHT, NO FAST FOOd  Review of Systems      See HPI General:  Complains of fatigue. Resp:  Complains of cough, shortness of breath, sputum productive, and wheezing.  Physical Exam  General:  Well-developed,well-nourished,in no acute distress; alert,appropriate and cooperative throughout examination Head:  no ttp max sinuses Ears:  no external deformities.   Nose:  no external deformity.   Mouth:  Oral mucosa and oropharynx without lesions or exudates.  Teeth in good repair. Neck:  No deformities, masses, or tenderness noted. Lungs:  Diffuse wheezing with scattered coarse BS. No focal crackles. no intercostal retractions and no accessory muscle use.   Heart:  Normal rate and regular rhythm. S1 and S2 normal without gallop, murmur, click, rub or other extra sounds. Extremities:  No clubbing, cyanosis, edema, or deformity noted with normal full range of motion of all joints.   Neurologic:  alert & oriented X3.   Cervical Nodes:  No lymphadenopathy noted Psych:  Cognition and judgment appear intact. Alert and  cooperative with normal attention span and concentration. No apparent delusions, illusions, hallucinations   Impression & Recommendations:  Problem # 1:  ASTHMA, INTRINSIC, WITH ACUTE EXACERBATION (ICD-493.12)  Unstable cont pred  taper and alb Cont abx - Avelox on Advair now  The following medications were removed from the medication list:    Prednisone 20 Mg Tabs (Prednisone) .Marland KitchenMarland KitchenMarland KitchenMarland Kitchen 3 tabs by mouth daily x 3 days, then 2 tabs by mouth daily x 2 days then 1 tab by mouth daily x 2 days His updated medication list for this problem includes:    Advair Diskus 250-50 Mcg/dose Aepb (Fluticasone-salmeterol) .[name]... 1 puff two times a day    Prednisone 20 Mg Tabs (Prednisone) .[name]... 2 tabs by mouth daily for 3 days, 1 tab by mouth for 3 days  Orders: Prescription Created Electronically [id number])  Problem # 2:  PNEUMONIA (ICD-486) Unstable and with asthma exacerbation  The following medications were removed from the medication list:    Levaquin 500 Mg Tabs (Levofloxacin) .[name]... 1 tab by mouth daily x 7 days His updated medication list for this problem includes:    Avelox 400 Mg Tabs (Moxifloxacin hcl) .[name]... 1 tablet by mouth daily  Complete Medication List: 1)  Clorazepate Dipotassium 3.75 Mg Tabs (Clorazepate dipotassium) .... Take 1 tablet by mouth twice a day 2)  Atenolol 50 Mg Tabs (Atenolol) .... Once daily 3)  Metformin Hcl 1000 Mg Tabs (Metformin hcl) .... Two times a day 4)  Calan Sr 180 Mg Cr-tabs (Verapamil hcl) .... Take 1 tablet by mouth once a day 5)  Warfarin Sodium 2.5 Mg Tabs (Warfarin sodium) .... As directed daily 6)  Fluoxetine Hcl 40 Mg Caps (Fluoxetine hcl) .... Once daily 7)  Fenofibrate 160 Mg Tabs (Fenofibrate) .... Take 1 tablet by mouth once a day 8)  Indomethacin 50 Mg Caps (Indomethacin) .... Every 8 hours as needed 9)  Lasix 20 Mg Tabs (Furosemide) .... Take 1 tablet by mouth once a day by mouth as needed swelling 10)  Simvastatin 80 Mg Tabs (Simvastatin) .... Take 1 tablet by mouth once a day 11)  Fish Oil Concentrate 1000 Mg Caps (Omega-3 fatty acids) .... Take 2 capsule by mouth two times a day 12)  Ascensia Autodisc Test Strp (Glucose blood) .... Test 1-2 times per day 13)  Clobetasol  Propionate 0.05 % Soln (Clobetasol propionate) .... Apply once a day as needed 14)  Triamcinolone Acetonide 0.025 % Crea (Triamcinolone acetonide) .... Apply once a day as needed 15)  Omeprazole 40 Mg Cpdr (Omeprazole) .[name].. 1 tab by mouth two times a day 16)  Glipizide Xl 10 Mg Xr24h-tab (Glipizide) .... Take 1 tablet by mouth once a day 17)  Bayer Breeze 2 Test Disk (Glucose blood) .... Check cbg 2 times daily dx 250.01 18)  Fish Oil Oil (Fish oil) .... 2 by mouth daily 19)  Byetta 5 Mcg Pen 5 Mcg/0.67ml Soln (Exenatide) .[name].. 1 injection subcutaneously two times a day 20)  Qc Pen Needles 31g X 8 Mm Misc (Insulin pen needle) .... Use 1 needle 2 times daily 21)  Advair Diskus 250-50 Mcg/dose Aepb (Fluticasone-salmeterol) .[name].. 1 puff two times a day 22)  Prednisone 20 Mg Tabs (Prednisone) .... 2 tabs by mouth daily for 3 days, 1 tab by mouth for 3 days 23)  Avelox 400 Mg Tabs (Moxifloxacin hcl) .[name].. 1 tablet by mouth daily  Patient Instructions: 1)  PT/INR checked now 2)  and PT/INR checked in 1 week Prescriptions: AVELOX 400 MG  TABS (MOXIFLOXACIN HCL) 1 tablet by mouth daily  #10 x 0   Entered and Authorized by:   Hannah Beat MD   Signed by:   Hannah Beat MD on 06/17/2009   Method used:   [facility]        [facility] [id number]       [address]       [place], [place]  [address]       Ph: [phone] or [phone]       Fax: [phone]   RxID:   [id number] PREDNISONE 20 MG TABS (PREDNISONE) 2 tabs by mouth daily for 3 days, 1 tab by mouth for 3 days  #9 x 0   Entered and Authorized by:   Hannah Beat MD   Signed by:   Hannah Beat MD on 06/17/2009   Method used:   [facility]        [facility] [id number]       [address]       [place], [place]  [address]       Ph: [phone] or [phone]       Fax: [phone]   RxID:   [id number]   Current Allergies (reviewed today): ! SULFACETAMIDE SODIUM  (SULFACETAMIDE SODIUM)  Appended Document: MONDAY F/U WHEEZING PER DR BEDSOLE/NT  Laboratory Results   Blood Tests   Date/Time Recieved: Jun 17, 2009 11:22 AM  Date/Time Reported: Jun 17, 2009 11:22 AM   PT: 31.7 s   (Normal Range: 10.6-13.4)  INR: 2.6   (Normal Range: 0.88-1.12   Therap INR: 2.0-3.5)      ANTICOAGULATION RECORD PREVIOUS REGIMEN & LAB RESULTS Anticoagulation Diagnosis:  pe/afib on  03/05/2009 Previous INR Goal Range:  2.5-3.5 on  03/05/2009 Previous INR:  2.3 on  05/17/2009 Previous Coumadin Dose(mg):  2.5 daily on  03/05/2009 Previous Regimen:  no change on  04/09/2009 Previous Coagulation Comments:  . on  01/09/2009  NEW REGIMEN & LAB RESULTS Current INR: 2.6 Regimen: no change  (no change)  Provider: Kathalene Sporer      Repeat testing in: 1 week MEDICATIONS CLORAZEPATE DIPOTASSIUM 3.75 MG TABS (CLORAZEPATE DIPOTASSIUM) Take 1 tablet by mouth twice a day ATENOLOL 50 MG TABS (ATENOLOL) once daily METFORMIN HCL 1000 MG TABS (METFORMIN HCL) two times a day CALAN SR 180 MG CR-TABS (VERAPAMIL HCL) Take 1 tablet by mouth once a day WARFARIN SODIUM 2.5 MG TABS (WARFARIN SODIUM) as directed daily FLUOXETINE HCL 40 MG CAPS (FLUOXETINE HCL) once daily FENOFIBRATE 160 MG TABS (FENOFIBRATE) Take 1 tablet by mouth once a day INDOMETHACIN 50 MG  CAPS (INDOMETHACIN) every 8 hours as needed LASIX 20 MG  TABS (FUROSEMIDE) Take 1 tablet by mouth once a day by mouth as needed swelling SIMVASTATIN 80 MG TABS (SIMVASTATIN) Take 1 tablet by mouth once a day FISH OIL CONCENTRATE 1000 MG  CAPS (OMEGA-3 FATTY ACIDS) Take 2 capsule by mouth two times a day ASCENSIA AUTODISC TEST   STRP (GLUCOSE BLOOD) Test 1-2 times per day CLOBETASOL PROPIONATE 0.05 %  SOLN (CLOBETASOL PROPIONATE) Apply once a day as needed TRIAMCINOLONE ACETONIDE 0.025 %  CREA (TRIAMCINOLONE ACETONIDE) Apply once a day as needed OMEPRAZOLE 40 MG CPDR (OMEPRAZOLE) 1 tab by mouth two times a day GLIPIZIDE XL 10  MG XR24H-TAB (GLIPIZIDE) Take 1 tablet by mouth once a day BAYER BREEZE 2 TEST  DISK (GLUCOSE BLOOD) Check CBG 2 times daily Dx 250.01 FISH OIL   OIL (FISH OIL)  2 by mouth daily BYETTA 5 MCG PEN 5 MCG/0.02ML SOLN (EXENATIDE) 1 injection Subcutaneously two times a day QC PEN NEEDLES 31G X 8 MM MISC (INSULIN PEN NEEDLE) use 1 needle 2 times daily ADVAIR DISKUS 250-50 MCG/DOSE AEPB (FLUTICASONE-SALMETEROL) 1 puff two times a day PREDNISONE 20 MG TABS (PREDNISONE) 2 tabs by mouth daily for 3 days, 1 tab by mouth for 3 days AVELOX 400 MG  TABS (MOXIFLOXACIN HCL) 1 tablet by mouth daily   Anticoagulation Visit Questionnaire      Coumadin dose missed/changed:  No      Abnormal Bleeding Symptoms:  No Any diet changes including alcohol intake, vegetables or greens since the last visit:  No Any illnesses or hospitalizations since the last visit:  Yes      Recent Illness/Hospitalizations:  Bronch, chest coongestion. Up steroid dose, and put on antibiotics Any signs of clotting since the last visit (including chest discomfort, dizziness, shortness of breath, arm tingling, slurred speech, swelling or redness in leg):  No

## 2010-03-11 NOTE — Medication Information (Signed)
Summary: Order for diabetes Testing Supplies  Order for diabetes Testing Supplies   Imported By: Maryln Gottron 09/27/2009 14:28:04  _____________________________________________________________________  External Attachment:    Type:   Image     Comment:   External Document

## 2010-03-11 NOTE — Assessment & Plan Note (Signed)
Summary: 1 YR/DMP   Visit Type:  Follow-up Primary Provider:  [name] MD  CC:  Atrial Fibrillation.  History of Present Illness: The patient presents for followup of  his atrial fibrillation.  Since I last saw him he has lost weight and is down about 30 pounds from his peak. He says he is feeling well. He denies any palpitations, presyncope or syncope. He has no shortness of breath or chest pain. He walks on his treadmill 30 minutes every day.  Current Medications (verified): 1)  Clorazepate Dipotassium 3.75 Mg Tabs (Clorazepate Dipotassium) .... Take 1 Tablet By Mouth Twice A Day 2)  Atenolol 50 Mg Tabs (Atenolol) .... Once Daily 3)  Metformin Hcl 1000 Mg Tabs (Metformin Hcl) .... Two Times A Day 4)  Calan Sr 180 Mg Cr-Tabs (Verapamil Hcl) .... Take 1 Tablet By Mouth Once A Day 5)  Warfarin Sodium 2.5 Mg Tabs (Warfarin Sodium) .... As Directed Daily 6)  Fluoxetine Hcl 40 Mg Caps (Fluoxetine Hcl) .... Once Daily 7)  Fenofibrate 160 Mg Tabs (Fenofibrate) .... Take 1 Tablet By Mouth Once A Day 8)  Indomethacin 50 Mg  Caps (Indomethacin) .... Every 8 Hours As Needed 9)  Lasix 20 Mg  Tabs (Furosemide) .... Take 1 Tablet By Mouth Once A Day By Mouth As Needed Swelling 10)  Simvastatin 80 Mg Tabs (Simvastatin) .... Take 1 Tablet By Mouth Once A Day 11)  Fish Oil Concentrate 1000 Mg  Caps (Omega-3 Fatty Acids) .... Take 2 Capsule By Mouth Two Times A Day 12)  Ascensia Autodisc Test   Strp (Glucose Blood) .... Test 1-2 Times Per Day 13)  Clobetasol Propionate 0.05 %  Soln (Clobetasol Propionate) .... Apply Once A Day As Needed 14)  Triamcinolone Acetonide 0.025 %  Crea (Triamcinolone Acetonide) .... Apply Once A Day As Needed 15)  Omeprazole 40 Mg Cpdr (Omeprazole) .[name].. 1 Tab By Mouth Two Times A Day 16)  Glipizide Xl 10 Mg Xr24h-Tab (Glipizide) .... Take 1 Tablet By Mouth Once A Day 17)  Bayer Breeze 2 Test  Disk (Glucose Blood) .... Check Cbg 2 Times Daily Dx 250.01 18)  Fish Oil   Oil  (Fish Oil) .... 2 By Mouth Daily  Allergies (verified): 1)  ! Sulfacetamide Sodium (Sulfacetamide Sodium)  Past History:  Past Medical History: Reviewed history from 05/23/2008 and no changes required. 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   Vital Signs:  Patient profile:   70 year old male Height:      74 inches Weight:      284 pounds BMI:     36.60 Pulse rate:   65 / minute Resp:     16 per minute BP sitting:   142 / 98  (right arm)  Vitals Entered  By: Marrion Coy, CNA (May 10, 2009 8:52 AM)  Physical Exam  General:  Well developed, well nourished, in no acute distress. Head:  normocephalic and atraumatic Eyes:  PERRLA/EOM intact; conjunctiva and lids normal. Neck:  Neck supple, no JVD. No masses, thyromegaly or abnormal cervical nodes. Chest Wall:  no deformities or breast masses noted Lungs:  Clear bilaterally to auscultation and percussion. Heart:  Non-displaced PMI, chest  non-tender; irregular rate and rhythm, S1, S2 without murmurs, rubs or gallops. Carotid upstroke normal, no bruit. Normal abdominal aortic size, no bruits. Femorals normal pulses, no bruits. Pedals normal pulses. No edema, no varicosities. Abdomen:  Bowel sounds positive; abdomen soft and non-tender without masses, organomegaly, or hernias noted. No hepatosplenomegaly. Msk:  Back normal, normal gait. Muscle strength and tone normal. Extremities:  No clubbing or cyanosis. Neurologic:  Alert and oriented x 3. Skin:  diffuse ecchymoses   EKG  Procedure date:  05/10/2009  Findings:      atrial fibrillation, rate 65, axis within normal limits, intervals within normal limits, no acute ST-T wave changes.  Impression & Recommendations:  Problem # 1:  ATRIAL FIBRILLATION, PAROXYSMAL (ICD-427.31) He tolerates this rhythm with rate control and anticoagulation. No further testing is indicated. Of note his last echo was April 2010. Orders: EKG w/ Interpretation (93000)  Problem # 2:  HYPERTENSION (ICD-401.9) He reports that his blood pressure is well controlled at home and I will make no change to his medical regimen.  Problem # 3:  OBESITY, BMI 35 (ICD-278.00) I congratulated him on his weight loss and encourage more of the same.  Patient Instructions: 1)  Your physician recommends that you schedule a follow-up appointment in: 1 year with Dr Antoine Poche 2)  Your physician recommends that you continue on your current medications as directed. Please refer to the Current Medication list given to you today. 3)  You have been diagnosed with atrial fibrillation.  Atrial fibrillation is a condition in which one of the upper chambers of the heart has extra electrical cells causing it to beat very fast.  Please see the handout/brochure given to you today for further information.

## 2010-03-11 NOTE — Progress Notes (Signed)
Summary: nystatin caused nausea  Phone Note Call from Patient   Caller: Spouse Call For: Jonathan Nora MD Summary of Call: Pt tried the nystatin for his throat but it made him vomit.  I told his wife for him not to swallow it but she said he didnt, but that some might have gone down his throat.  She is asking if there is something oral that he can take for this.  [facility]. Initial call taken by: Lowella Petties CMA,  August 30, 2009 12:57 PM  Follow-up for Phone Call        Will try fluconazole. Follow-up by: Jonathan Nora MD,  August 30, 2009 1:43 PM  Additional Follow-up for Phone Call Additional follow up Details #1::        Patients wife advised.Consuello Masse CMA   Additional Follow-up by: Benny Lennert CMA Duncan Dull),  August 30, 2009 2:11 PM    New/Updated Medications: FLUCONAZOLE 150 MG TABS (FLUCONAZOLE) 1 tab by mouth x 7 days Prescriptions: FLUCONAZOLE 150 MG TABS (FLUCONAZOLE) 1 tab by mouth x 7 days  #7 x 0   Entered and Authorized by:   Jonathan Nora MD   Signed by:   Jonathan Nora MD on 08/30/2009   Method used:   [facility] to        [facility] (retail)       [address]       [place], [place]  [address]       Ph: [phone] or [phone]       Fax: [phone]   RxID:   [id number]

## 2010-03-11 NOTE — Progress Notes (Signed)
Summary: results of PFT  Phone Note Call from Patient Call back at Home Phone [phone]   Caller: Patient Call For: ramaswamy Summary of Call: pt requests results of PFT.  Initial call taken by: Tivis Ringer, CNA,  November 21, 2009 9:47 AM  Follow-up for Phone Call        called and spoke with pt.  pt had PFTs done here 11/14/2009.  Pt requesting results.  Pt is aware MR out of the office this week and will return next week.  Will forward message to MR to address. Aundra Millet Reynolds LPN  November 21, 2009 10:14 AM   Additional Follow-up for Phone Call Additional follow up Details #1::        can you fax it to [facility] now please ? Additional Follow-up by: Kalman Shan MD,  November 29, 2009 4:34 PM    Additional Follow-up for Phone Call Additional follow up Details #2::    PFT faxed.Carron Curie CMA  November 29, 2009 4:40 PM  PFT 11/14/2009 - nromal but for mild reduction in diffusion that could be aRDS related.Conveyed this to him. He then gave hx of childhood asthma.Will get methacholine challenge (order done) and will see him in office after test. Pls coordinate  Follow-up by: Kalman Shan MD,  November 29, 2009 6:51 PM  Additional Follow-up for Phone Call Additional follow up Details #3:: Details for Additional Follow-up Action Taken: MCT set for 12-31-09 and ROV set for 01-08-10. pt aware. Carron Curie CMA  December 02, 2009 11:29 AM

## 2010-03-11 NOTE — Letter (Signed)
Summary: Revised CMN for Diabetes [facility]lies/[facility]  Revised CMN for Diabetes Supplies/[facility]   Imported By: Lanelle Bal 10/02/2009 08:46:10  _____________________________________________________________________  External Attachment:    Type:   Image     Comment:   External Document

## 2010-03-11 NOTE — Assessment & Plan Note (Signed)
Summary: CONGESTION IN CHEST & BAD SORE THROAT / LFW   Vital Signs:  Patient profile:   70 year old male Weight:      248.50 pounds O2 Sat:      99 % on Room air Temp:     97.7 degrees F oral Pulse rate:   86 / minute Pulse rhythm:   regular BP sitting:   120 / 90  (right arm) Cuff size:   large  Vitals Entered By: Janee Morn CMA (September 02, 2009 11:45 AM)  O2 Flow:  Room air CC: Congestion/sore throat   History of Present Illness: 71 s/p recent ICU d/c, trach:  complicate patient, status post recent  ICU  stay with  intubation, tracheostomy, acute renal failure requiring dialysis, acute sepsis, and liver failure. Patient of Dr. Ermalene Searing  who is seeing me today in acute situation with some sore throat  multiple rounds of antibiotics recently, including long courses of IV abx. Lastly, the patient was called his nystatin, and he was unable to tolerate this, and ultimately was told his Diflucan. He is continued to be symptomatic. Primarily complaint he is of sore throat.  On Coumadin - 2 mg every day  cbc, iron panel, ferritin, transferrin  08/28/2009 office note Admitted for  left groin cellutitis and Septic shock (strep viridins bacteremia) 6/8 to 7/8 [facility]. Was discharged from rehab 2 days ago.  In hospital treated with 3 IV antibitocs...central line placed, on right. Had cath placed. Has debrided infection in left abcess. Placed on ventilator for surgery.  Once trach placed6/22...he improved and became more responsive.  Was unable to get off ventilator and was unresonsive at this point.  To get of ventilator..trach was placed Acute renal failure due to sepsis... dialysis temporarily...on discharge 2 days ago 1.9 Liver failure due to sepsis.Marland Kitchennormalized at discharge.  Poor control DM (initial blood sugar on admission 500)...at discharge CBGs improved  on levemir 5 mg two times a day.  Now off metformin. Wife requests referral to endocrinologist.  Janina Mayo  removed (coughed out) 7/6 ...on regular diet...no coughing with eating. Healing well now.   While in hosp spent time in ICU on ventilator  Discharged from hospital Wife doing wound care. PT planned for tommorow.  Dr. Reesa Chew (surgeon)... appt for wound check 8/10.   Allergies: 1)  ! Sulfacetamide Sodium (Sulfacetamide Sodium)  Past History:  Past medical, surgical, family and social histories (including risk factors) reviewed, and no changes noted (except as noted below).  Past Medical History: ICU stay, sepsis,  strep viridans. Status post renal failure, liver failure, tracheostomy, with recovery. 07/2009 HERPES ZOSTER W/NERVOUS COMPLICATION NEC (ICD-053.19) DIVERTICULOSIS, COLON (ICD-562.10) PULMONARY EMBOLISM, DVT (ICD-415.19) RESTLESS LEG SYNDROME (ICD-333.94) OBESITY, BMI 35 (ICD-278.00) IRRITABLE BOWEL SYNDROME (ICD-564.1) GASTROPARESIS (ICD-536.3) HYPERTENSION (ICD-401.9) LOW BACK PAIN, CHRONIC (ICD-724.2) DEPRESSION (ICD-311) ANXIETY (ICD-300.00) ASTHMA, INTERMITTENT, MILD (ICD-493.90) GERD, ESOPHAGITIS (ICD-530.81) ATRIAL FIBRILLATION, PAROXYSMAL (ICD-427.31) HYPERCHOLESTEROLEMIA (ICD-272.0) DM (ICD-250.00)    Past Surgical History: Reviewed history from 05/23/2008 and no changes required. 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 Father: DIED 101 + MI, EMPHYSEMA Mother: DIED 73, PNA, ? UTERINE CA, METASTATIC Siblings: 4 BROTHERS, MI AGE 28, PROSTATE CA 1 SISTER ALZHEIMER'S, PNA NO MI < 55  Social History: Reviewed history from 05/23/2008 and no changes required. Marital Status: Married X 25 YEARS  Children: 1 DAUGHTER, HEALTHY Occupation: RETIRED TRUCK DRIVER EXERCISE:  WALKS TREADMILL 30 MINUTES DIET:  3 MEALS, F& V, SOME WATER, CRYSTAL  LIGHT, NO FAST FOOd  Review of Systems      See HPI       able to tolerate some p.o. intake,  fever to 99.5 yesterday, continued sore throat. Some minimal cough..  Physical Exam  General:  patient with a great deal of weight loss compared to prior exams Head:  normocephalic and atraumatic.   Ears:  External ear exam shows no significant lesions or deformities.  Otoscopic examination reveals clear canals, tympanic membranes are intact bilaterally without bulging, retraction, inflammation or discharge. Hearing is grossly normal bilaterally. Mouth:  Oral mucosa and oropharynx without lesions or exudates.  Teeth in good repair. Neck:  No deformities, masses, or tenderness noted. Lungs:  normal respiratory effort, improved aitrmovement threough out, rare exp wheeze, prolongued exp phase.  Heart:  Normal rate and regular rhythm. S1 and S2 normal without gallop, murmur, click, rub or other extra sounds. Neurologic:  alert & oriented X3.   Cervical Nodes:  No lymphadenopathy noted Psych:  Cognition and judgment appear intact. Alert and cooperative with normal attention span and concentration. No apparent delusions, illusions, hallucinations   Impression & Recommendations:  Problem # 1:  SORE THROAT (ICD-462) very complex case rapid strep neg on antifungals given 5-10% false negative rate of rapid strep, will treat with amox   if continues, low threshold for referral in this case, nasolaryngoscopical eval appropriate  His updated medication list for this problem includes:    Nystatin 100000 Unit/ml Susp (Nystatin) .[name]... 5-10 cc swish and swallow some if doesn't causing nausea... qid until 48 hours after symtpoms gone.    Amoxicillin 875 Mg Tabs (Amoxicillin) .[name]... 1 by mouth two times a day  Orders: T-Culture, Throat ([id number]) Prescription Created Electronically [id number])  Problem # 2:  UNSPECIFIED ANEMIA (ICD-285.9)  His updated medication list for this problem includes:     Ferrous Sulfate 325 (65 Fe) Mg Tabs (Ferrous sulfate) .[name]... Take one tablet 2 times a day  Orders: Venipuncture (91478) TLB-CBC Platelet - w/Differential (85025-CBCD) TLB-IBC Pnl (Iron/FE;Transferrin) (83550-IBC) TLB-Ferritin (82728-FER)  Problem # 3:  RENAL INSUFFICIENCY (ICD-588.9)  Orders: TLB-BMP (Basic Metabolic Panel-BMET) (80048-METABOL)  Complete Medication List: 1)  Fluoxetine Hcl 40 Mg Caps (Fluoxetine hcl) .... Once daily 2)  Omeprazole 40 Mg Cpdr (Omeprazole) .[name].. 1 tab by mouth two times a day 3)  Nephrocaps 1 Mg Caps (B complex-c-folic acid) .... One tablet daily 4)  Levemir 100 Unit/ml Soln (Insulin detemir) .... 5 units in am and pm 5)  Ferrous Sulfate 325 (65 Fe) Mg Tabs (Ferrous sulfate) .... Take one tablet 2 times a day 6)  Klor-con M10 10 Meq Cr-tabs (Potassium chloride crys cr) .... Take one tablet daily for 2 weeks 7)  Metoprolol Succinate 25 Mg Xr24h-tab (Metoprolol succinate) .... One tablet by mouth 2 times daily 8)  Clonazepam 0.5 Mg Tabs (Clonazepam) .[name].. 1 trab by mouth every 8 hours as needed for agitation 9)  Nystatin 100000 Unit/ml Susp (Nystatin) .... 5-10 cc swish and swallow some if doesn't causing nausea... qid until 48 hours after symtpoms gone. 10)  Fluconazole 150 Mg Tabs (Fluconazole) .[name].. 1 tab by mouth x 7 days 11)  Amoxicillin 875 Mg Tabs (Amoxicillin) .[name].. 1 by mouth two times a day  Other Orders: Protime ([id number]) Prescriptions: AMOXICILLIN 875 MG TABS (AMOXICILLIN) 1 by mouth two times a day  #20 x 0  Entered and Authorized by:   Hannah Beat MD   Signed by:   Hannah Beat MD on 09/02/2009   Method used:   [facility]        [facility]       [address]       [place], [place]  [address]       Ph: [phone] or [phone]       Fax: [phone]   RxID:   [id number]   Current Allergies (reviewed today): ! SULFACETAMIDE SODIUM (SULFACETAMIDE SODIUM)  Laboratory Results   Blood  Tests   Date/Time Received: September 02, 2009 12:58 PM Date/Time Reported: September 02, 2009 12:58 PM  PT: 47.3 s   (Normal Range: 10.6-13.4)  INR: 3.9   (Normal Range: 0.88-1.12   Therap INR: 2.0-3.5) Date/Time Received: September 02, 2009  Date/Time Reported: September 02, 2009   Other Tests  Rapid Strep: negative     ANTICOAGULATION RECORD PREVIOUS REGIMEN & LAB RESULTS Anticoagulation Diagnosis:  pe/afib on  03/05/2009 Previous INR Goal Range:  2.5-3.5 on  03/05/2009 Previous INR:  2.9 on  06/24/2009 Previous Coumadin Dose(mg):  2.5 daily on  03/05/2009 Previous Regimen:  no change on  04/09/2009 Previous Coagulation Comments:  . on  01/09/2009  NEW REGIMEN & LAB RESULTS Current INR: 3.9 Current Coumadin Dose(mg): 2 mg daily Regimen: 2 mg daily, no change  Provider: bedsole Repeat testing in: 1 week Dose has been reviewed with patient or caretaker during this visit. Reviewed by: Celso Sickle  Anticoagulation Visit Questionnaire Coumadin dose missed/changed:  No Abnormal Bleeding Symptoms:  No  Any diet changes including alcohol intake, vegetables or greens since the last visit:  Yes      Diet Comments:not eating well Any illnesses or hospitalizations since the last visit:  Yes      Recent Illness/Hospitalizations:  pt d/c 1 week ago, was hospitalized for 40 days Any signs of clotting since the last visit (including chest discomfort, dizziness, shortness of breath, arm tingling, slurred speech, swelling or redness in leg):  No  MEDICATIONS FLUOXETINE HCL 40 MG CAPS (FLUOXETINE HCL) once daily OMEPRAZOLE 40 MG CPDR (OMEPRAZOLE) 1 tab by mouth two times a day NEPHROCAPS 1 MG CAPS (B COMPLEX-C-FOLIC ACID) one tablet daily LEVEMIR 100 UNIT/ML SOLN (INSULIN DETEMIR) 5 units in am and pm FERROUS SULFATE 325 (65 FE) MG TABS (FERROUS SULFATE) take one tablet 2 times a day KLOR-CON M10 10 MEQ CR-TABS (POTASSIUM CHLORIDE CRYS CR) take one tablet daily for 2 weeks METOPROLOL SUCCINATE 25  MG XR24H-TAB (METOPROLOL SUCCINATE) one tablet by mouth 2 times daily CLONAZEPAM 0.5 MG TABS (CLONAZEPAM) 1 trab by mouth every 8 hours as needed for agitation NYSTATIN 100000 UNIT/ML SUSP (NYSTATIN) 5-10 cc swish and swallow some if doesn't causing nausea... QID until 48 hours after symtpoms gone. FLUCONAZOLE 150 MG TABS (FLUCONAZOLE) 1 tab by mouth x 7 days AMOXICILLIN 875 MG TABS (AMOXICILLIN) 1 by mouth two times a day

## 2010-03-11 NOTE — Assessment & Plan Note (Signed)
Summary: A COLD / LFW   Vital Signs:  Patient profile:   70 year old male Height:      74 inches Weight:      256.0 pounds BMI:     32.99 O2 Sat:      99 % on Room air Temp:     97.9 degrees F oral Pulse rate:   104 / minute Pulse rhythm:   regular BP sitting:   120 / 70  (left arm) Cuff size:   large  Vitals Entered By: Benny Lennert CMA Duncan Dull) (October 29, 2009 2:38 PM)  O2 Flow:  Room air  History of Present Illness: Chief complaint cold   71 year old male with recent s/p recent long ICU stay with intubation, tracheostomy, ARF req dialysis, liver failure, on multiple IV abx in the hospital with complex sepsis,  recent trach removal in past few months.  Problems with chronic hoarsness and voice loss...s/p trach. Felt likely nerve issue die to high fever from recent illness.  Voice improving now.   Presents today with 4-5 days...increase in SOB. Using nebs..nightly. Started cough last night...dry. No fever.  Last bronchitis.. in 09/2009..on avelox and prednisone.   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) 3)  Renal Insufficiency  (ICD-588.9) 4)  Unspecified Anemia  (ICD-285.9) 5)  Allergic Rhinitis Cause Unspecified  (ICD-477.9) 6)  Asthma, Intrinsic, With Acute Exacerbation  (ICD-493.12) 7)  Chest Xray, Abnormal  (ICD-793.1) 8)  Special Screening Malignant Neoplasm of Prostate  (ICD-V76.44) 9)  Hydronephrosis, Right  (ICD-591) 10)  Encounter For Long-term Use of Other Medications  (ICD-V58.69) 11)  Foot Surgery, Hx of  (ICD-V15.89) 12)  Knee Replacement, Left, Hx of  (ICD-V43.65) 13)  Encounter For Therapeutic Drug Monitoring  (ICD-V58.83) 14)  Aftercare, Long-term Use, Anticoagulants  (ICD-V58.61) 15)  Herpes Zoster W/nervous Complication Nec  (ICD-053.19) 16)  Diverticulosis, Colon  (ICD-562.10) 17)  Pulmonary Embolism, Dvt  (ICD-415.19) 18)  Restless Leg Syndrome  (ICD-333.94) 19)   Obesity, Bmi 35  (ICD-278.00) 20)  Irritable Bowel Syndrome  (ICD-564.1) 21)  Gastroparesis  (ICD-536.3) 22)  Hypertension  (ICD-401.9) 23)  Low Back Pain, Chronic  (ICD-724.2) 24)  Depression  (ICD-311) 25)  Anxiety  (ICD-300.00) 26)  Asthma, Persistent, Mild  (ICD-493.90) 27)  Gerd, Esophagitis  (ICD-530.81) 28)  Atrial Fibrillation, Paroxysmal  (ICD-427.31) 29)  Hypercholesterolemia  (ICD-272.0) 30)  Dm  (ICD-250.00)  Current Medications (verified): 1)  Fluoxetine Hcl 40 Mg Caps (Fluoxetine Hcl) .... Once Daily 2)  Omeprazole 40 Mg Cpdr (Omeprazole) .[name].. 1 Tab By Mouth Two Times A Day 3)  Nephrocaps 1 Mg Caps (B Complex-C-Folic Acid) .... One Tablet Daily 4)  Levemir 100 Unit/ml Soln (Insulin Detemir) .[name].. 14 Units in Am Once A Day 5)  Ferrous Sulfate 325 (65 Fe) Mg Tabs (Ferrous Sulfate) .... Take One Tablet 2 Times A Day 6)  Metoprolol Succinate 25 Mg Xr24h-Tab (Metoprolol Succinate) .... One Tablet By Mouth 2 Times Daily 7)  Clonazepam 0.5 Mg Tabs (Clonazepam) .[name].. 1 Trab By Mouth Every 8 Hours As Needed For Agitation 8)  Novolog 100 Unit/ml Soln (Insulin Aspart) .... 6 Units With Every Meal +scale 9)  Hydrocodone-Acetaminophen 10-325 Mg Tabs (Hydrocodone-Acetaminophen) .[name].. 1-2 Every 4-6 Hours As Needed Max Acetominophen Is 4000 Mg Daily 10)  Ambien 5 Mg Tabs (Zolpidem Tartrate) .... Take 1-2 By Mouth At Bedtime As Needed For Insomnia 11)  Warfarin Sodium 2 Mg Tabs (Warfarin Sodium) .Marland KitchenMarland KitchenMarland Kitchen  Take As Directed  Allergies: 1)  ! Sulfacetamide Sodium (Sulfacetamide Sodium)  Past History:  Past medical, surgical, family and social histories (including risk factors) reviewed, and no changes noted (except as noted below).  Past Medical History: Reviewed history from 09/02/2009 and no changes required. ICU stay, sepsis,  strep viridans. Status post renal failure, liver failure, tracheostomy, with recovery. 07/2009 HERPES ZOSTER W/NERVOUS COMPLICATION NEC (ICD-053.19) DIVERTICULOSIS,  COLON (ICD-562.10) PULMONARY EMBOLISM, DVT (ICD-415.19) RESTLESS LEG SYNDROME (ICD-333.94) OBESITY, BMI 35 (ICD-278.00) IRRITABLE BOWEL SYNDROME (ICD-564.1) GASTROPARESIS (ICD-536.3) HYPERTENSION (ICD-401.9) LOW BACK PAIN, CHRONIC (ICD-724.2) DEPRESSION (ICD-311) ANXIETY (ICD-300.00) ASTHMA, INTERMITTENT, MILD (ICD-493.90) GERD, ESOPHAGITIS (ICD-530.81) ATRIAL FIBRILLATION, PAROXYSMAL (ICD-427.31) HYPERCHOLESTEROLEMIA (ICD-272.0) DM (ICD-250.00)    Past Surgical History: Reviewed history from 05/23/2008 and no changes required. 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 Father: DIED 59 + MI, EMPHYSEMA Mother: DIED 95, PNA, ? UTERINE CA, METASTATIC Siblings: 4 BROTHERS, MI AGE 40, PROSTATE CA 1 SISTER ALZHEIMER'S, PNA NO MI < 55  Social History: Reviewed history from 05/23/2008 and no changes required. Marital Status: Married X 25 YEARS Children: 1 DAUGHTER, HEALTHY Occupation: RETIRED TRUCK DRIVER EXERCISE:  WALKS TREADMILL 30 MINUTES DIET:  3 MEALS, F& V, SOME WATER, CRYSTAL LIGHT, NO FAST FOOd  Review of Systems General:  Complains of fatigue; denies fever. CV:  Denies chest pain or discomfort. Resp:  Complains of cough, shortness of breath, and wheezing; denies coughing up blood and sputum productive.  Physical Exam  General:  Overweight appearing male inNAD Head:  no maxillary sinus pain  Eyes:  No corneal or conjunctival inflammation noted. EOMI. Perrla. Funduscopic exam benign, without hemorrhages, exudates or papilledema. Vision grossly normal. Ears:  no external deformities.   Nose:  no external deformity.   Mouth:  Oral mucosa and oropharynx without lesions or exudates.  Dentures Neck:  no carotid bruit or thyromegaly no cervical or supraclavicular  lymphadenopathy  Lungs:  occ wheezing without focal crackles, normal respiratory effort, no intercostal retractions, and no accessory muscle use.   Heart:  Irr rate and rhythm Pulses:  R and L posterior tibial pulses are full and equal bilaterally  Extremities:  no edema    Impression & Recommendations:  Problem # 1:  ASTHMA, INTRINSIC, WITH ACUTE EXACERBATION (ICD-493.12) Given recent extensive hospitalization and risk for infection...treat for bacterial bronchitis with Z-ppack and prednsione taper. No sign of respiratory distress. Follow up if not improving in 48-72 hours.  The following medications were removed from the medication list:    Prednisone 20 Mg Tabs (Prednisone) .[name]... 2 tabs by mouth x 5 days, then 1 tab by mouth x 3 days His updated medication list for this problem includes:    Prednisone 20 Mg Tabs (Prednisone) .Marland KitchenMarland KitchenMarland KitchenMarland Kitchen 3 tabs by mouth daily x 3 days, then 2 tabs by mouth daily x 2 days then 1 tab by mouth daily x 2 days  Complete Medication List: 1)  Fluoxetine Hcl 40 Mg Caps (Fluoxetine hcl) .... Once daily 2)  Omeprazole 40 Mg Cpdr (Omeprazole) .[name].. 1 tab by mouth two times a day 3)  Nephrocaps 1 Mg Caps (B complex-c-folic acid) .... One tablet daily 4)  Levemir 100 Unit/ml Soln (Insulin detemir) .[name].. 18 units in am once a day 5)  Ferrous Sulfate 325 (65 Fe) Mg Tabs (Ferrous sulfate) .... Take one tablet  2 times a day 6)  Metoprolol Succinate 25 Mg Xr24h-tab (Metoprolol succinate) .... One tablet by mouth 2 times daily 7)  Clonazepam 0.5 Mg Tabs (Clonazepam) .[name].. 1 trab by mouth every 8 hours as needed for agitation 8)  Novolog 100 Unit/ml Soln (Insulin aspart) .... 6 units with every meal + slinding scale 9)  Hydrocodone-acetaminophen 10-325 Mg Tabs (Hydrocodone-acetaminophen) .[name].. 1-2 every 4-6 hours as needed max acetominophen is 4000 mg daily 10)  Ambien 5 Mg Tabs (Zolpidem tartrate) .... Take 1-2 by mouth at bedtime as needed for insomnia 11)  Warfarin Sodium 2 Mg  Tabs (Warfarin sodium) .... Take as directed 12)  Azithromycin 250 Mg Tabs (Azithromycin) .... 2 tab by mouth x 1 day then 1 tab by mouth x 4 day 13)  Prednisone 20 Mg Tabs (Prednisone) .... 3 tabs by mouth daily x 3 days, then 2 tabs by mouth daily x 2 days then 1 tab by mouth daily x 2 days Prescriptions: PREDNISONE 20 MG TABS (PREDNISONE) 3 tabs by mouth daily x 3 days, then 2 tabs by mouth daily x 2 days then 1 tab by mouth daily x 2 days  #15 x 0   Entered and Authorized by:   [name] MD   Signed by:   [name] MD on 10/29/2009   Method used:   [facility]        [facility] [id number]       [address]       [place], [place]  [address]       Ph: [phone] or [phone]       Fax: [phone]   RxID:   [id number] AZITHROMYCIN 250 MG TABS (AZITHROMYCIN) 2 tab by mouth x 1 day then 1 tab by mouth x 4 day  #6 x 0   Entered and Authorized by:   [name] MD   Signed by:   [name] MD on 10/29/2009   Method used:   [facility]        [facility] [id number]       [address]       [place], [place]  [address]       Ph: [phone] or [phone]       Fax: [phone]   RxID:   [id number] LEVEMIR 100 UNIT/ML SOLN (INSULIN DETEMIR) 18 units in am once a day  #12 x 100U x 3   Entered and Authorized by:   [name] MD   Signed by:   [name] MD on 10/29/2009   Method used:   Electronically to        [facility] (mail-order)       [address]       [place], [place]  [address]       Ph: [phone]       Fax: [phone]   RxID:   [id number] NOVOLOG 100 UNIT/ML SOLN (INSULIN ASPART) 6 units with every meal + slinding scale  #12x 100U x 3   Entered and Authorized by:   [name] MD   Signed by:   [name] MD on 10/29/2009   Method used:   Electronically to        [facility]* (mail-order)       [address]       [place], [place]  [address]       Ph:  [phone]       Fax: [phone]  RxID:   [id number] LEVEMIR 100 UNIT/ML SOLN (INSULIN DETEMIR) 14 units in am once a day  #1 x 100units x 0   Entered and Authorized by:   [name] MD   Signed by:   [name] MD on 10/29/2009   Method used:   [facility]        [facility] [id number]       [address]       [place], [place]  [address]       Ph: [phone] or [phone]       Fax: [phone]   RxID:   [id number] NOVOLOG 100 UNIT/ML SOLN (INSULIN ASPART) 6 units with every meal + slinding scale  #1x 100 Units x 0   Entered and Authorized by:   [name] MD   Signed by:   [name] MD on 10/29/2009   Method used:   [facility]        [facility] [facility]ail[facility]       [address] [place] [address]       [place], [place]  [address]       Ph: [phone] or [phone]       Fax: [phone]   RxID:   [id number]   Current Allergies (reviewed today): ! SULFACETAMIDE SODIUM (SULFACETAMIDE SODIUM)

## 2010-03-11 NOTE — Progress Notes (Signed)
Summary: prior auth denied for advair  Phone Note From Pharmacy   Caller: [facility]* Summary of Call: Prior auth denied for advair, letter is on  your desk. Initial call taken by: Lowella Petties CMA,  Jun 28, 2009 4:57 PM

## 2010-03-11 NOTE — Progress Notes (Signed)
Summary: requesting ambien  Phone Note Call from Patient Call back at [phone]   Caller: Spouse- Dondra Spry Call For: [name] MD Summary of Call: Pt was prescribed clonazepam to help him sleep, but this is not helping.  They know that Remus Loffler works at [employer].  [facility]. Initial call taken by: Lowella Petties CMA,  September 12, 2009 12:31 PM  Follow-up for Phone Call        OKay to cahnge clonazepam to ambien 5 mg  1-2tab by mouth at bedtime as needed insomnia.[name] #30  0RF.Marland KitchenMarland KitchenI cannot send in or add to EMR because chart locked by pulm .Marland KitchenMarland Kitchenplease call in and change in EMR when able.  Follow-up by: [name] MD,  September 12, 2009 1:10 PM  Additional Follow-up for Phone Call Additional follow up Details #1::        Rx Called In Additional Follow-up by: Benny Lennert CMA Duncan Dull),  September 12, 2009 3:50 PM     Appended Document: requesting Kandis Cocking pt's wife that script was called in.

## 2010-03-11 NOTE — Progress Notes (Signed)
Summary: refill request for vicodin, ambien  Phone Note Refill Request Message from:  Fax from Pharmacy  Refills Requested: Medication #1:  HYDROCODONE-ACETAMINOPHEN 10-325 MG TABS 1-2 every 4-6 hours as needed MAX acetominophen is 4000 mg daily   Last Refilled: 09/23/2009  Medication #2:  Remus Loffler 5 mg   Dosage confirmed as above?Dosage Confirmed   Last Refilled: 09/12/2009 [facility] [address], [phone].  Initial call taken by: Lowella Petties CMA,  October 10, 2009 9:40 AM  Follow-up for Phone Call        defer to PCP on narcotics Follow-up by: Hannah Beat MD,  October 10, 2009 9:57 AM  Additional Follow-up for Phone Call Additional follow up Details #1::        needed today, i remember well, went ahead and filled. Hannah Beat MD  October 10, 2009 4:45 PM

## 2010-03-13 NOTE — Letter (Signed)
Summary: [facility]  [facility]   Imported By: Lanelle Bal 02/18/2010 10:32:49  _____________________________________________________________________  External Attachment:    Type:   Image     Comment:   External Document

## 2010-03-14 NOTE — Miscellaneous (Signed)
Summary: Referral form/[facility]  Referral form/[facility]   Imported By: Sherian Rein 12/02/2009 07:22:48  _____________________________________________________________________  External Attachment:    Type:   Image     Comment:   External Document

## 2010-03-14 NOTE — Letter (Signed)
Summary: [facility]  [facility]   Imported By: Lester Calumet 10/18/2009 08:42:52  _____________________________________________________________________  External Attachment:    Type:   Image     Comment:   External Document

## 2010-03-14 NOTE — Medication Information (Signed)
Summary: [facility]  [facility]   Imported By: Lanelle Bal 10/07/2009 09:27:23  _____________________________________________________________________  External Attachment:    Type:   Image     Comment:   External Document

## 2010-03-20 ENCOUNTER — Encounter: Payer: Self-pay | Admitting: Family Medicine

## 2010-03-20 ENCOUNTER — Encounter (INDEPENDENT_AMBULATORY_CARE_PROVIDER_SITE_OTHER): Payer: Self-pay | Admitting: *Deleted

## 2010-04-02 ENCOUNTER — Ambulatory Visit: Payer: Self-pay

## 2010-04-04 ENCOUNTER — Ambulatory Visit: Payer: Self-pay

## 2010-04-07 ENCOUNTER — Encounter: Payer: Self-pay | Admitting: Family Medicine

## 2010-04-07 ENCOUNTER — Encounter (INDEPENDENT_AMBULATORY_CARE_PROVIDER_SITE_OTHER): Payer: Self-pay | Admitting: *Deleted

## 2010-04-07 ENCOUNTER — Ambulatory Visit (INDEPENDENT_AMBULATORY_CARE_PROVIDER_SITE_OTHER): Payer: Medicare Other

## 2010-04-07 DIAGNOSIS — Z5181 Encounter for therapeutic drug level monitoring: Secondary | ICD-10-CM

## 2010-04-07 DIAGNOSIS — I2699 Other pulmonary embolism without acute cor pulmonale: Secondary | ICD-10-CM

## 2010-04-07 DIAGNOSIS — Z7901 Long term (current) use of anticoagulants: Secondary | ICD-10-CM

## 2010-04-07 LAB — CONVERTED CEMR LAB: Prothrombin Time: 28.3 s

## 2010-04-08 NOTE — Letter (Addendum)
Summary: [name] at [facility]   Imported By: Maryln Gottron 04/04/2010 15:15:20  _____________________________________________________________________  External Attachment:    Type:   Image     Comment:   External Document  Appended Document: [facility] enter..lipids, Hg, TSH, creatinine, A1C and  AST/ALT  if present into EMR.  Thanks.   Appended Document: [facility] done.Consuello Masse CMA

## 2010-04-12 ENCOUNTER — Encounter: Payer: Self-pay | Admitting: Family Medicine

## 2010-04-17 NOTE — Medication Information (Signed)
Summary: protime/alc   PCP: [name] MD and Dr Karleen Hampshire Copland Indication 1: pe/afib PT 28.3 INR RANGE 2.5-3.5           Allergies: 1)  ! Sulfacetamide Sodium (Sulfacetamide Sodium)  Anticoagulation Management History:      Positive risk factors for bleeding include an age of 69 years or older and presence of serious comorbidities.  The bleeding index is 'intermediate risk'.  Positive CHADS2 values include History of HTN and History of Diabetes.  Negative CHADS2 values include Age > 69 years old.  His last INR was 3.6 and today's INR is 2.4.  Prothrombin time is 28.3.    Anticoagulation Management Assessment/Plan:      The patient's current anticoagulation dose is Coumadin 2.5 mg tabs: as directed, Coumadin 2 mg tabs: as directed.  The next INR is due 4 weeks.        Laboratory Results   Blood Tests   Date/Time Recieved: April 07, 2010 2:47 PM  Date/Time Reported: April 07, 2010 2:47 PM   PT: 28.3 s   (Normal Range: 10.6-13.4)  INR: 2.4   (Normal Range: 0.88-1.12   Therap INR: 2.0-3.5)      ANTICOAGULATION RECORD PREVIOUS REGIMEN & LAB RESULTS Anticoagulation Diagnosis:  pe/afib on  03/05/2009 Previous INR Goal Range:  2.5-3.5 on  03/05/2009 Previous INR:  3.6 on  03/05/2010 Previous Coumadin Dose(mg):   2.5mg  daily, 5mg  mon on  02/05/2010 Previous Regimen:   2.5,g daily, 5mg  mon on  01/08/2010 Previous Coagulation Comments:  . on  01/09/2009  NEW REGIMEN & LAB RESULTS Current INR: 2.4 Regimen:  2.5,g daily, 5mg  mon  (no change)  Provider: Ursula Dermody      Repeat testing in: 4 weeks MEDICATIONS FLUOXETINE HCL 40 MG CAPS (FLUOXETINE HCL) once daily OMEPRAZOLE 40 MG CPDR (OMEPRAZOLE) 1 tab by mouth two times a day NEPHROCAPS 1 MG CAPS (B COMPLEX-C-FOLIC ACID) one tablet daily LEVEMIR 100 UNIT/ML SOLN (INSULIN DETEMIR) 30 units in am once a day FERROUS SULFATE 325 (65 FE) MG TABS (FERROUS SULFATE) take one tablet 2 times a day METOPROLOL SUCCINATE 25  MG XR24H-TAB (METOPROLOL SUCCINATE) one tablet by mouth 2 times daily CLONAZEPAM 0.5 MG TABS (CLONAZEPAM) 1 trab by mouth every 8 hours as needed for agitation NOVOLOG 100 UNIT/ML SOLN (INSULIN ASPART) 9 units with every meal + slinding scale HYDROCODONE-ACETAMINOPHEN 10-325 MG TABS (HYDROCODONE-ACETAMINOPHEN) 1-2 every 4-6 hours as needed MAX acetominophen is 4000 mg daily COUMADIN 2.5 MG TABS (WARFARIN SODIUM) as directed BAYER BREEZE 2 TEST  DISK (GLUCOSE BLOOD) check blood sugar three times a day LANCETS  MISC (LANCETS) check blood sugar three times a day BD INSULIN SYRINGE ULTRAFINE 31G X 5/16" 0.5 ML MISC (INSULIN SYRINGE-NEEDLE U-100) inject insulin four times a day COUMADIN 2 MG TABS (WARFARIN SODIUM) as directed DOXYCYCLINE HYCLATE 100 MG CAPS (DOXYCYCLINE HYCLATE) take one tablet twice daily FLUTICASONE PROPIONATE 50 MCG/ACT SUSP (FLUTICASONE PROPIONATE) 2 sprays per nostril daily HYDROCODONE-HOMATROPINE 5-1.5 MG/5ML SYRP (HYDROCODONE-HOMATROPINE) 5ml by mouth q6h as needed cough  Dose has been reviewed with patient or caretaker during this visit.  Reviewed by: Allison Quarry  Anticoagulation Visit Questionnaire      Coumadin dose missed/changed:  No      Abnormal Bleeding Symptoms:  No Any diet changes including alcohol intake, vegetables or greens since the last visit:  No Any illnesses or hospitalizations since the last visit:  No Any signs of clotting since the last visit (including chest discomfort, dizziness, shortness of breath, arm tingling,  slurred speech, swelling or redness in leg):  No

## 2010-04-17 NOTE — Miscellaneous (Signed)
  Clinical Lists Changes  Observations: Added new observation of HGBA1C: 6.3 % (03/20/2010 9:19) Added new observation of BG COMMENT: 134  (03/20/2010 9:19)

## 2010-04-21 LAB — GLUCOSE, CAPILLARY: Glucose-Capillary: 157 mg/dL — ABNORMAL HIGH (ref 70–99)

## 2010-04-22 LAB — CBC
HCT: 43.5 % (ref 39.0–52.0)
Hemoglobin: 14.7 g/dL (ref 13.0–17.0)
MCH: 31.1 pg (ref 26.0–34.0)
RBC: 4.72 MIL/uL (ref 4.22–5.81)

## 2010-04-22 LAB — COMPREHENSIVE METABOLIC PANEL
ALT: 19 U/L (ref 0–53)
AST: 20 U/L (ref 0–37)
CO2: 24 mEq/L (ref 19–32)
Chloride: 106 mEq/L (ref 96–112)
GFR calc Af Amer: >60 mL/min (ref >60)
GFR calc non Af Amer: 54 mL/min — ABNORMAL LOW (ref >60)
Sodium: 139 mEq/L (ref 135–145)
Total Bilirubin: 0.7 mg/dL (ref 0.3–1.2)

## 2010-04-22 LAB — PROTIME-INR: Prothrombin Time: 14.4 seconds (ref 11.6–15.2)

## 2010-04-22 LAB — URINALYSIS, ROUTINE W REFLEX MICROSCOPIC
Bilirubin Urine: NEGATIVE
Hgb urine dipstick: NEGATIVE
Nitrite: NEGATIVE
Specific Gravity, Urine: 1.035 — ABNORMAL HIGH (ref 1.005–1.030)
pH: 5 (ref 5.0–8.0)

## 2010-04-22 LAB — DIFFERENTIAL
Basophils Absolute: 0.1 10*3/uL (ref 0.0–0.1)
Basophils Relative: 1 % (ref 0–1)
Eosinophils Absolute: 0.2 10*3/uL (ref 0.0–0.7)
Eosinophils Relative: 2 % (ref 0–5)

## 2010-04-22 LAB — TYPE AND SCREEN: Antibody Screen: NEGATIVE

## 2010-04-22 LAB — URINE MICROSCOPIC-ADD ON

## 2010-04-23 LAB — CBC
HCT: 43 % (ref 39.0–52.0)
MCV: 90.9 fL (ref 78.0–100.0)
RDW: 14.7 % (ref 11.5–15.5)
WBC: 7.4 10*3/uL (ref 4.0–10.5)

## 2010-04-23 LAB — COMPREHENSIVE METABOLIC PANEL
Alkaline Phosphatase: 62 U/L (ref 39–117)
BUN: 16 mg/dL (ref 6–23)
Chloride: 108 mEq/L (ref 96–112)
GFR calc non Af Amer: 57 mL/min — ABNORMAL LOW (ref >60)
Glucose, Bld: 116 mg/dL — ABNORMAL HIGH (ref 70–99)
Potassium: 4.8 mEq/L (ref 3.5–5.1)
Total Bilirubin: 0.7 mg/dL (ref 0.3–1.2)
Total Protein: 7 g/dL (ref 6.0–8.3)

## 2010-04-23 LAB — DIFFERENTIAL
Basophils Absolute: 0.1 10*3/uL (ref 0.0–0.1)
Basophils Relative: 1 % (ref 0–1)
Neutro Abs: 3.9 10*3/uL (ref 1.7–7.7)
Neutrophils Relative %: 53 % (ref 43–77)

## 2010-04-23 LAB — SURGICAL PCR SCREEN
MRSA, PCR: NEGATIVE
Staphylococcus aureus: NEGATIVE

## 2010-04-23 LAB — URINALYSIS, ROUTINE W REFLEX MICROSCOPIC
Bilirubin Urine: NEGATIVE
Ketones, ur: NEGATIVE mg/dL
Nitrite: NEGATIVE
Urobilinogen, UA: 0.2 mg/dL (ref 0.0–1.0)

## 2010-04-23 LAB — PROTIME-INR
INR: 2.36 — ABNORMAL HIGH (ref 0.00–1.49)
Prothrombin Time: 25.9 seconds — ABNORMAL HIGH (ref 11.6–15.2)

## 2010-04-23 LAB — APTT: aPTT: 38 seconds — ABNORMAL HIGH (ref 24–37)

## 2010-04-23 LAB — URINE MICROSCOPIC-ADD ON

## 2010-04-25 LAB — PROTIME-INR
INR: 2.25 — ABNORMAL HIGH (ref 0.00–1.49)
Prothrombin Time: 25 seconds — ABNORMAL HIGH (ref 11.6–15.2)

## 2010-04-25 LAB — CBC
MCHC: 31.3 g/dL (ref 30.0–36.0)
MCV: 92 fL (ref 78.0–100.0)
Platelets: 308 10*3/uL (ref 150–400)
RDW: 17.3 % — ABNORMAL HIGH (ref 11.5–15.5)
WBC: 11.5 10*3/uL — ABNORMAL HIGH (ref 4.0–10.5)

## 2010-04-25 LAB — GLUCOSE, CAPILLARY: Glucose-Capillary: 173 mg/dL — ABNORMAL HIGH (ref 70–99)

## 2010-04-25 LAB — SURGICAL PCR SCREEN
MRSA, PCR: NEGATIVE
Staphylococcus aureus: NEGATIVE

## 2010-04-25 LAB — COMPREHENSIVE METABOLIC PANEL
Albumin: 3.1 g/dL — ABNORMAL LOW (ref 3.5–5.2)
Alkaline Phosphatase: 57 U/L (ref 39–117)
BUN: 17 mg/dL (ref 6–23)
Calcium: 9.1 mg/dL (ref 8.4–10.5)
Potassium: 4.3 mEq/L (ref 3.5–5.1)
Sodium: 138 mEq/L (ref 135–145)
Total Protein: 5.8 g/dL — ABNORMAL LOW (ref 6.0–8.3)

## 2010-04-26 LAB — BASIC METABOLIC PANEL
BUN: 10 mg/dL (ref 6–23)
BUN: 11 mg/dL (ref 6–23)
BUN: 12 mg/dL (ref 6–23)
CO2: 16 mEq/L — ABNORMAL LOW (ref 19–32)
CO2: 16 mEq/L — ABNORMAL LOW (ref 19–32)
Calcium: 7.9 mg/dL — ABNORMAL LOW (ref 8.4–10.5)
Calcium: 8 mg/dL — ABNORMAL LOW (ref 8.4–10.5)
Chloride: 107 mEq/L (ref 96–112)
Chloride: 110 mEq/L (ref 96–112)
Creatinine, Ser: 1.89 mg/dL — ABNORMAL HIGH (ref 0.4–1.5)
Creatinine, Ser: 2.03 mg/dL — ABNORMAL HIGH (ref 0.4–1.5)
Creatinine, Ser: 2.12 mg/dL — ABNORMAL HIGH (ref 0.4–1.5)
GFR calc non Af Amer: 31 mL/min — ABNORMAL LOW (ref >60)
Glucose, Bld: 127 mg/dL — ABNORMAL HIGH (ref 70–99)
Glucose, Bld: 128 mg/dL — ABNORMAL HIGH (ref 70–99)
Glucose, Bld: 135 mg/dL — ABNORMAL HIGH (ref 70–99)
Potassium: 3.5 mEq/L (ref 3.5–5.1)
Potassium: 3.6 mEq/L (ref 3.5–5.1)

## 2010-04-26 LAB — PROTIME-INR
INR: 2.22 — ABNORMAL HIGH (ref 0.00–1.49)
Prothrombin Time: 21.1 seconds — ABNORMAL HIGH (ref 11.6–15.2)

## 2010-04-27 LAB — RENAL FUNCTION PANEL
Albumin: 2.4 g/dL — ABNORMAL LOW (ref 3.5–5.2)
Calcium: 8.5 mg/dL (ref 8.4–10.5)
Creatinine, Ser: 5.46 mg/dL — ABNORMAL HIGH (ref 0.4–1.5)
GFR calc Af Amer: 13 mL/min — ABNORMAL LOW (ref >60)
GFR calc non Af Amer: 11 mL/min — ABNORMAL LOW (ref >60)
Phosphorus: 5.3 mg/dL — ABNORMAL HIGH (ref 2.3–4.6)
Sodium: 135 mEq/L (ref 135–145)

## 2010-04-27 LAB — BASIC METABOLIC PANEL
BUN: 21 mg/dL (ref 6–23)
BUN: 27 mg/dL — ABNORMAL HIGH (ref 6–23)
BUN: 40 mg/dL — ABNORMAL HIGH (ref 6–23)
BUN: 44 mg/dL — ABNORMAL HIGH (ref 6–23)
BUN: 47 mg/dL — ABNORMAL HIGH (ref 6–23)
CO2: 17 mEq/L — ABNORMAL LOW (ref 19–32)
CO2: 18 mEq/L — ABNORMAL LOW (ref 19–32)
CO2: 21 mEq/L (ref 19–32)
CO2: 24 mEq/L (ref 19–32)
CO2: 26 mEq/L (ref 19–32)
Calcium: 8.2 mg/dL — ABNORMAL LOW (ref 8.4–10.5)
Calcium: 8.3 mg/dL — ABNORMAL LOW (ref 8.4–10.5)
Calcium: 8.4 mg/dL (ref 8.4–10.5)
Calcium: 8.4 mg/dL (ref 8.4–10.5)
Calcium: 8.5 mg/dL (ref 8.4–10.5)
Calcium: 8.5 mg/dL (ref 8.4–10.5)
Calcium: 8.6 mg/dL (ref 8.4–10.5)
Chloride: 104 mEq/L (ref 96–112)
Chloride: 105 mEq/L (ref 96–112)
Chloride: 97 mEq/L (ref 96–112)
Chloride: 98 mEq/L (ref 96–112)
Chloride: 99 mEq/L (ref 96–112)
Chloride: 99 mEq/L (ref 96–112)
Creatinine, Ser: 2.31 mg/dL — ABNORMAL HIGH (ref 0.4–1.5)
Creatinine, Ser: 2.7 mg/dL — ABNORMAL HIGH (ref 0.4–1.5)
Creatinine, Ser: 3.71 mg/dL — ABNORMAL HIGH (ref 0.4–1.5)
Creatinine, Ser: 4.6 mg/dL — ABNORMAL HIGH (ref 0.4–1.5)
Creatinine, Ser: 5.02 mg/dL — ABNORMAL HIGH (ref 0.4–1.5)
Creatinine, Ser: 5.52 mg/dL — ABNORMAL HIGH (ref 0.4–1.5)
Creatinine, Ser: 5.86 mg/dL — ABNORMAL HIGH (ref 0.4–1.5)
GFR calc Af Amer: 12 mL/min — ABNORMAL LOW (ref >60)
GFR calc Af Amer: 13 mL/min — ABNORMAL LOW (ref >60)
GFR calc Af Amer: 16 mL/min — ABNORMAL LOW (ref >60)
GFR calc Af Amer: 27 mL/min — ABNORMAL LOW (ref >60)
GFR calc Af Amer: 29 mL/min — ABNORMAL LOW (ref >60)
GFR calc Af Amer: 31 mL/min — ABNORMAL LOW (ref >60)
GFR calc Af Amer: 34 mL/min — ABNORMAL LOW (ref >60)
GFR calc non Af Amer: 10 mL/min — ABNORMAL LOW (ref >60)
GFR calc non Af Amer: 13 mL/min — ABNORMAL LOW (ref >60)
GFR calc non Af Amer: 22 mL/min — ABNORMAL LOW (ref >60)
GFR calc non Af Amer: 24 mL/min — ABNORMAL LOW (ref >60)
GFR calc non Af Amer: 26 mL/min — ABNORMAL LOW (ref >60)
GFR calc non Af Amer: 28 mL/min — ABNORMAL LOW (ref >60)
Glucose, Bld: 141 mg/dL — ABNORMAL HIGH (ref 70–99)
Glucose, Bld: 159 mg/dL — ABNORMAL HIGH (ref 70–99)
Glucose, Bld: 162 mg/dL — ABNORMAL HIGH (ref 70–99)
Glucose, Bld: 247 mg/dL — ABNORMAL HIGH (ref 70–99)
Potassium: 3.4 mEq/L — ABNORMAL LOW (ref 3.5–5.1)
Potassium: 3.5 mEq/L (ref 3.5–5.1)
Potassium: 3.5 mEq/L (ref 3.5–5.1)
Potassium: 3.6 mEq/L (ref 3.5–5.1)
Potassium: 3.8 mEq/L (ref 3.5–5.1)
Potassium: 4 mEq/L (ref 3.5–5.1)
Potassium: 4.3 mEq/L (ref 3.5–5.1)
Sodium: 133 mEq/L — ABNORMAL LOW (ref 135–145)
Sodium: 136 mEq/L (ref 135–145)
Sodium: 136 mEq/L (ref 135–145)
Sodium: 136 mEq/L (ref 135–145)
Sodium: 139 mEq/L (ref 135–145)

## 2010-04-27 LAB — PROTIME-INR
INR: 1.71 — ABNORMAL HIGH (ref 0.00–1.49)
INR: 2.06 — ABNORMAL HIGH (ref 0.00–1.49)
INR: 2.14 — ABNORMAL HIGH (ref 0.00–1.49)
INR: 2.15 — ABNORMAL HIGH (ref 0.00–1.49)
INR: 2.57 — ABNORMAL HIGH (ref 0.00–1.49)
INR: 2.68 — ABNORMAL HIGH (ref 0.00–1.49)
INR: 3.01 — ABNORMAL HIGH (ref 0.00–1.49)
INR: 3.2 — ABNORMAL HIGH (ref 0.00–1.49)
Prothrombin Time: 22.6 seconds — ABNORMAL HIGH (ref 11.6–15.2)
Prothrombin Time: 23 seconds — ABNORMAL HIGH (ref 11.6–15.2)
Prothrombin Time: 23.7 seconds — ABNORMAL HIGH (ref 11.6–15.2)
Prothrombin Time: 23.8 seconds — ABNORMAL HIGH (ref 11.6–15.2)
Prothrombin Time: 24.2 seconds — ABNORMAL HIGH (ref 11.6–15.2)
Prothrombin Time: 30.5 seconds — ABNORMAL HIGH (ref 11.6–15.2)

## 2010-04-27 LAB — COMPREHENSIVE METABOLIC PANEL
Alkaline Phosphatase: 64 U/L (ref 39–117)
BUN: 32 mg/dL — ABNORMAL HIGH (ref 6–23)
CO2: 22 mEq/L (ref 19–32)
Chloride: 102 mEq/L (ref 96–112)
Glucose, Bld: 219 mg/dL — ABNORMAL HIGH (ref 70–99)
Potassium: 3.4 mEq/L — ABNORMAL LOW (ref 3.5–5.1)
Total Bilirubin: 0.4 mg/dL (ref 0.3–1.2)

## 2010-04-27 LAB — CBC
HCT: 30.5 % — ABNORMAL LOW (ref 39.0–52.0)
HCT: 31.1 % — ABNORMAL LOW (ref 39.0–52.0)
HCT: 31.1 % — ABNORMAL LOW (ref 39.0–52.0)
HCT: 31.2 % — ABNORMAL LOW (ref 39.0–52.0)
HCT: 31.7 % — ABNORMAL LOW (ref 39.0–52.0)
Hemoglobin: 10.1 g/dL — ABNORMAL LOW (ref 13.0–17.0)
Hemoglobin: 10.2 g/dL — ABNORMAL LOW (ref 13.0–17.0)
Hemoglobin: 10.5 g/dL — ABNORMAL LOW (ref 13.0–17.0)
MCH: 30.6 pg (ref 26.0–34.0)
MCHC: 32.8 g/dL (ref 30.0–36.0)
MCHC: 33 g/dL (ref 30.0–36.0)
MCV: 91.2 fL (ref 78.0–100.0)
MCV: 93.1 fL (ref 78.0–100.0)
MCV: 93.8 fL (ref 78.0–100.0)
MCV: 94 fL (ref 78.0–100.0)
Platelets: 381 10*3/uL (ref 150–400)
Platelets: 447 10*3/uL — ABNORMAL HIGH (ref 150–400)
Platelets: 481 10*3/uL — ABNORMAL HIGH (ref 150–400)
RBC: 3.3 MIL/uL — ABNORMAL LOW (ref 4.22–5.81)
RBC: 3.34 MIL/uL — ABNORMAL LOW (ref 4.22–5.81)
RBC: 3.37 MIL/uL — ABNORMAL LOW (ref 4.22–5.81)
RBC: 3.41 MIL/uL — ABNORMAL LOW (ref 4.22–5.81)
RBC: 3.48 MIL/uL — ABNORMAL LOW (ref 4.22–5.81)
RBC: 3.58 MIL/uL — ABNORMAL LOW (ref 4.22–5.81)
RDW: 16.6 % — ABNORMAL HIGH (ref 11.5–15.5)
RDW: 16.6 % — ABNORMAL HIGH (ref 11.5–15.5)
RDW: 16.9 % — ABNORMAL HIGH (ref 11.5–15.5)
RDW: 17.4 % — ABNORMAL HIGH (ref 11.5–15.5)
RDW: 17.4 % — ABNORMAL HIGH (ref 11.5–15.5)
WBC: 10.5 10*3/uL (ref 4.0–10.5)
WBC: 7.7 10*3/uL (ref 4.0–10.5)
WBC: 7.7 10*3/uL (ref 4.0–10.5)
WBC: 9.1 10*3/uL (ref 4.0–10.5)
WBC: 9.4 10*3/uL (ref 4.0–10.5)

## 2010-04-27 LAB — DIFFERENTIAL
Basophils Absolute: 0.1 10*3/uL (ref 0.0–0.1)
Basophils Relative: 1 % (ref 0–1)
Eosinophils Relative: 6 % — ABNORMAL HIGH (ref 0–5)
Monocytes Absolute: 1.1 10*3/uL — ABNORMAL HIGH (ref 0.1–1.0)
Neutro Abs: 4.9 10*3/uL (ref 1.7–7.7)

## 2010-04-27 LAB — GLUCOSE, CAPILLARY
Glucose-Capillary: 134 mg/dL — ABNORMAL HIGH (ref 70–99)
Glucose-Capillary: 168 mg/dL — ABNORMAL HIGH (ref 70–99)
Glucose-Capillary: 169 mg/dL — ABNORMAL HIGH (ref 70–99)
Glucose-Capillary: 176 mg/dL — ABNORMAL HIGH (ref 70–99)
Glucose-Capillary: 176 mg/dL — ABNORMAL HIGH (ref 70–99)
Glucose-Capillary: 178 mg/dL — ABNORMAL HIGH (ref 70–99)
Glucose-Capillary: 185 mg/dL — ABNORMAL HIGH (ref 70–99)
Glucose-Capillary: 203 mg/dL — ABNORMAL HIGH (ref 70–99)
Glucose-Capillary: 210 mg/dL — ABNORMAL HIGH (ref 70–99)
Glucose-Capillary: 214 mg/dL — ABNORMAL HIGH (ref 70–99)
Glucose-Capillary: 218 mg/dL — ABNORMAL HIGH (ref 70–99)
Glucose-Capillary: 222 mg/dL — ABNORMAL HIGH (ref 70–99)
Glucose-Capillary: 223 mg/dL — ABNORMAL HIGH (ref 70–99)
Glucose-Capillary: 229 mg/dL — ABNORMAL HIGH (ref 70–99)
Glucose-Capillary: 233 mg/dL — ABNORMAL HIGH (ref 70–99)
Glucose-Capillary: 241 mg/dL — ABNORMAL HIGH (ref 70–99)
Glucose-Capillary: 271 mg/dL — ABNORMAL HIGH (ref 70–99)

## 2010-04-27 LAB — MAGNESIUM: Magnesium: 1.6 mg/dL (ref 1.5–2.5)

## 2010-04-27 LAB — CLOSTRIDIUM DIFFICILE EIA: C difficile Toxins A+B, EIA: NEGATIVE

## 2010-04-27 LAB — PROCALCITONIN: Procalcitonin: <0.5 ng/mL

## 2010-04-28 LAB — GLUCOSE, CAPILLARY
Glucose-Capillary: 100 mg/dL — ABNORMAL HIGH (ref 70–99)
Glucose-Capillary: 103 mg/dL — ABNORMAL HIGH (ref 70–99)
Glucose-Capillary: 104 mg/dL — ABNORMAL HIGH (ref 70–99)
Glucose-Capillary: 105 mg/dL — ABNORMAL HIGH (ref 70–99)
Glucose-Capillary: 107 mg/dL — ABNORMAL HIGH (ref 70–99)
Glucose-Capillary: 107 mg/dL — ABNORMAL HIGH (ref 70–99)
Glucose-Capillary: 108 mg/dL — ABNORMAL HIGH (ref 70–99)
Glucose-Capillary: 109 mg/dL — ABNORMAL HIGH (ref 70–99)
Glucose-Capillary: 111 mg/dL — ABNORMAL HIGH (ref 70–99)
Glucose-Capillary: 112 mg/dL — ABNORMAL HIGH (ref 70–99)
Glucose-Capillary: 113 mg/dL — ABNORMAL HIGH (ref 70–99)
Glucose-Capillary: 114 mg/dL — ABNORMAL HIGH (ref 70–99)
Glucose-Capillary: 116 mg/dL — ABNORMAL HIGH (ref 70–99)
Glucose-Capillary: 118 mg/dL — ABNORMAL HIGH (ref 70–99)
Glucose-Capillary: 118 mg/dL — ABNORMAL HIGH (ref 70–99)
Glucose-Capillary: 120 mg/dL — ABNORMAL HIGH (ref 70–99)
Glucose-Capillary: 120 mg/dL — ABNORMAL HIGH (ref 70–99)
Glucose-Capillary: 126 mg/dL — ABNORMAL HIGH (ref 70–99)
Glucose-Capillary: 126 mg/dL — ABNORMAL HIGH (ref 70–99)
Glucose-Capillary: 127 mg/dL — ABNORMAL HIGH (ref 70–99)
Glucose-Capillary: 128 mg/dL — ABNORMAL HIGH (ref 70–99)
Glucose-Capillary: 130 mg/dL — ABNORMAL HIGH (ref 70–99)
Glucose-Capillary: 130 mg/dL — ABNORMAL HIGH (ref 70–99)
Glucose-Capillary: 133 mg/dL — ABNORMAL HIGH (ref 70–99)
Glucose-Capillary: 133 mg/dL — ABNORMAL HIGH (ref 70–99)
Glucose-Capillary: 133 mg/dL — ABNORMAL HIGH (ref 70–99)
Glucose-Capillary: 134 mg/dL — ABNORMAL HIGH (ref 70–99)
Glucose-Capillary: 135 mg/dL — ABNORMAL HIGH (ref 70–99)
Glucose-Capillary: 136 mg/dL — ABNORMAL HIGH (ref 70–99)
Glucose-Capillary: 136 mg/dL — ABNORMAL HIGH (ref 70–99)
Glucose-Capillary: 137 mg/dL — ABNORMAL HIGH (ref 70–99)
Glucose-Capillary: 137 mg/dL — ABNORMAL HIGH (ref 70–99)
Glucose-Capillary: 137 mg/dL — ABNORMAL HIGH (ref 70–99)
Glucose-Capillary: 139 mg/dL — ABNORMAL HIGH (ref 70–99)
Glucose-Capillary: 139 mg/dL — ABNORMAL HIGH (ref 70–99)
Glucose-Capillary: 139 mg/dL — ABNORMAL HIGH (ref 70–99)
Glucose-Capillary: 140 mg/dL — ABNORMAL HIGH (ref 70–99)
Glucose-Capillary: 141 mg/dL — ABNORMAL HIGH (ref 70–99)
Glucose-Capillary: 142 mg/dL — ABNORMAL HIGH (ref 70–99)
Glucose-Capillary: 142 mg/dL — ABNORMAL HIGH (ref 70–99)
Glucose-Capillary: 143 mg/dL — ABNORMAL HIGH (ref 70–99)
Glucose-Capillary: 143 mg/dL — ABNORMAL HIGH (ref 70–99)
Glucose-Capillary: 145 mg/dL — ABNORMAL HIGH (ref 70–99)
Glucose-Capillary: 145 mg/dL — ABNORMAL HIGH (ref 70–99)
Glucose-Capillary: 146 mg/dL — ABNORMAL HIGH (ref 70–99)
Glucose-Capillary: 149 mg/dL — ABNORMAL HIGH (ref 70–99)
Glucose-Capillary: 150 mg/dL — ABNORMAL HIGH (ref 70–99)
Glucose-Capillary: 150 mg/dL — ABNORMAL HIGH (ref 70–99)
Glucose-Capillary: 151 mg/dL — ABNORMAL HIGH (ref 70–99)
Glucose-Capillary: 153 mg/dL — ABNORMAL HIGH (ref 70–99)
Glucose-Capillary: 154 mg/dL — ABNORMAL HIGH (ref 70–99)
Glucose-Capillary: 158 mg/dL — ABNORMAL HIGH (ref 70–99)
Glucose-Capillary: 158 mg/dL — ABNORMAL HIGH (ref 70–99)
Glucose-Capillary: 160 mg/dL — ABNORMAL HIGH (ref 70–99)
Glucose-Capillary: 161 mg/dL — ABNORMAL HIGH (ref 70–99)
Glucose-Capillary: 163 mg/dL — ABNORMAL HIGH (ref 70–99)
Glucose-Capillary: 166 mg/dL — ABNORMAL HIGH (ref 70–99)
Glucose-Capillary: 167 mg/dL — ABNORMAL HIGH (ref 70–99)
Glucose-Capillary: 167 mg/dL — ABNORMAL HIGH (ref 70–99)
Glucose-Capillary: 168 mg/dL — ABNORMAL HIGH (ref 70–99)
Glucose-Capillary: 169 mg/dL — ABNORMAL HIGH (ref 70–99)
Glucose-Capillary: 170 mg/dL — ABNORMAL HIGH (ref 70–99)
Glucose-Capillary: 172 mg/dL — ABNORMAL HIGH (ref 70–99)
Glucose-Capillary: 175 mg/dL — ABNORMAL HIGH (ref 70–99)
Glucose-Capillary: 177 mg/dL — ABNORMAL HIGH (ref 70–99)
Glucose-Capillary: 180 mg/dL — ABNORMAL HIGH (ref 70–99)
Glucose-Capillary: 181 mg/dL — ABNORMAL HIGH (ref 70–99)
Glucose-Capillary: 181 mg/dL — ABNORMAL HIGH (ref 70–99)
Glucose-Capillary: 182 mg/dL — ABNORMAL HIGH (ref 70–99)
Glucose-Capillary: 182 mg/dL — ABNORMAL HIGH (ref 70–99)
Glucose-Capillary: 183 mg/dL — ABNORMAL HIGH (ref 70–99)
Glucose-Capillary: 183 mg/dL — ABNORMAL HIGH (ref 70–99)
Glucose-Capillary: 187 mg/dL — ABNORMAL HIGH (ref 70–99)
Glucose-Capillary: 188 mg/dL — ABNORMAL HIGH (ref 70–99)
Glucose-Capillary: 193 mg/dL — ABNORMAL HIGH (ref 70–99)
Glucose-Capillary: 194 mg/dL — ABNORMAL HIGH (ref 70–99)
Glucose-Capillary: 194 mg/dL — ABNORMAL HIGH (ref 70–99)
Glucose-Capillary: 196 mg/dL — ABNORMAL HIGH (ref 70–99)
Glucose-Capillary: 197 mg/dL — ABNORMAL HIGH (ref 70–99)
Glucose-Capillary: 197 mg/dL — ABNORMAL HIGH (ref 70–99)
Glucose-Capillary: 200 mg/dL — ABNORMAL HIGH (ref 70–99)
Glucose-Capillary: 201 mg/dL — ABNORMAL HIGH (ref 70–99)
Glucose-Capillary: 207 mg/dL — ABNORMAL HIGH (ref 70–99)
Glucose-Capillary: 213 mg/dL — ABNORMAL HIGH (ref 70–99)
Glucose-Capillary: 220 mg/dL — ABNORMAL HIGH (ref 70–99)
Glucose-Capillary: 263 mg/dL — ABNORMAL HIGH (ref 70–99)
Glucose-Capillary: 277 mg/dL — ABNORMAL HIGH (ref 70–99)
Glucose-Capillary: 283 mg/dL — ABNORMAL HIGH (ref 70–99)
Glucose-Capillary: 293 mg/dL — ABNORMAL HIGH (ref 70–99)
Glucose-Capillary: 302 mg/dL — ABNORMAL HIGH (ref 70–99)
Glucose-Capillary: 323 mg/dL — ABNORMAL HIGH (ref 70–99)
Glucose-Capillary: 336 mg/dL — ABNORMAL HIGH (ref 70–99)
Glucose-Capillary: 354 mg/dL — ABNORMAL HIGH (ref 70–99)
Glucose-Capillary: 360 mg/dL — ABNORMAL HIGH (ref 70–99)
Glucose-Capillary: 505 mg/dL — ABNORMAL HIGH (ref 70–99)
Glucose-Capillary: 65 mg/dL — ABNORMAL LOW (ref 70–99)
Glucose-Capillary: 70 mg/dL (ref 70–99)
Glucose-Capillary: 85 mg/dL (ref 70–99)
Glucose-Capillary: 94 mg/dL (ref 70–99)
Glucose-Capillary: 99 mg/dL (ref 70–99)

## 2010-04-28 LAB — PROCALCITONIN
Procalcitonin: 115.89 ng/mL
Procalcitonin: 14.1 ng/mL
Procalcitonin: 16.1 ng/mL
Procalcitonin: 4.02 ng/mL

## 2010-04-28 LAB — POCT I-STAT 3, ART BLOOD GAS (G3+)
Acid-Base Excess: 7 mmol/L — ABNORMAL HIGH (ref 0.0–2.0)
Acid-base deficit: 2 mmol/L (ref 0.0–2.0)
Acid-base deficit: 5 mmol/L — ABNORMAL HIGH (ref 0.0–2.0)
Bicarbonate: 18.8 mEq/L — ABNORMAL LOW (ref 20.0–24.0)
Bicarbonate: 22.9 mEq/L (ref 20.0–24.0)
Bicarbonate: 24.2 mEq/L — ABNORMAL HIGH (ref 20.0–24.0)
Bicarbonate: 26.2 mEq/L — ABNORMAL HIGH (ref 20.0–24.0)
Bicarbonate: 33.4 mEq/L — ABNORMAL HIGH (ref 20.0–24.0)
O2 Saturation: 95 %
O2 Saturation: 99 %
O2 Saturation: 99 %
Patient temperature: 36.6
Patient temperature: 96.6
Patient temperature: 98.6
Patient temperature: 98.6
Patient temperature: 98.7
TCO2: 24 mmol/L (ref 0–100)
TCO2: 26 mmol/L (ref 0–100)
TCO2: 28 mmol/L (ref 0–100)
TCO2: 35 mmol/L (ref 0–100)
pCO2 arterial: 36.5 mmHg (ref 35.0–45.0)
pCO2 arterial: 38.4 mmHg (ref 35.0–45.0)
pCO2 arterial: 39.1 mmHg (ref 35.0–45.0)
pCO2 arterial: 50.9 mmHg — ABNORMAL HIGH (ref 35.0–45.0)
pH, Arterial: 7.322 — ABNORMAL LOW (ref 7.350–7.450)
pH, Arterial: 7.325 — ABNORMAL LOW (ref 7.350–7.450)
pH, Arterial: 7.388 (ref 7.350–7.450)
pH, Arterial: 7.428 (ref 7.350–7.450)
pH, Arterial: 7.452 — ABNORMAL HIGH (ref 7.350–7.450)
pO2, Arterial: 114 mmHg — ABNORMAL HIGH (ref 80.0–100.0)
pO2, Arterial: 122 mmHg — ABNORMAL HIGH (ref 80.0–100.0)
pO2, Arterial: 68 mmHg — ABNORMAL LOW (ref 80.0–100.0)
pO2, Arterial: 98 mmHg (ref 80.0–100.0)

## 2010-04-28 LAB — POCT I-STAT EG7
Acid-Base Excess: 13 mmol/L — ABNORMAL HIGH (ref 0.0–2.0)
Acid-Base Excess: 14 mmol/L — ABNORMAL HIGH (ref 0.0–2.0)
Acid-Base Excess: 14 mmol/L — ABNORMAL HIGH (ref 0.0–2.0)
Acid-Base Excess: 5 mmol/L — ABNORMAL HIGH (ref 0.0–2.0)
Acid-Base Excess: 9 mmol/L — ABNORMAL HIGH (ref 0.0–2.0)
Bicarbonate: 25.8 mEq/L — ABNORMAL HIGH (ref 20.0–24.0)
Bicarbonate: 29.5 mEq/L — ABNORMAL HIGH (ref 20.0–24.0)
Bicarbonate: 30.7 mEq/L — ABNORMAL HIGH (ref 20.0–24.0)
Bicarbonate: 34.5 mEq/L — ABNORMAL HIGH (ref 20.0–24.0)
Bicarbonate: 38.4 mEq/L — ABNORMAL HIGH (ref 20.0–24.0)
Bicarbonate: 39 mEq/L — ABNORMAL HIGH (ref 20.0–24.0)
Bicarbonate: 39.5 mEq/L — ABNORMAL HIGH (ref 20.0–24.0)
Bicarbonate: 41 mEq/L — ABNORMAL HIGH (ref 20.0–24.0)
Bicarbonate: 41 mEq/L — ABNORMAL HIGH (ref 20.0–24.0)
Bicarbonate: 42.2 mEq/L — ABNORMAL HIGH (ref 20.0–24.0)
Calcium, Ion: 0.46 mmol/L — CL (ref 1.12–1.32)
Calcium, Ion: 0.49 mmol/L — CL (ref 1.12–1.32)
Calcium, Ion: 0.55 mmol/L — CL (ref 1.12–1.32)
Calcium, Ion: 0.56 mmol/L — CL (ref 1.12–1.32)
Calcium, Ion: 0.56 mmol/L — CL (ref 1.12–1.32)
Calcium, Ion: 0.6 mmol/L — CL (ref 1.12–1.32)
Calcium, Ion: 0.63 mmol/L — CL (ref 1.12–1.32)
Calcium, Ion: 0.65 mmol/L — CL (ref 1.12–1.32)
HCT: 32 % — ABNORMAL LOW (ref 39.0–52.0)
HCT: 34 % — ABNORMAL LOW (ref 39.0–52.0)
HCT: 34 % — ABNORMAL LOW (ref 39.0–52.0)
HCT: 35 % — ABNORMAL LOW (ref 39.0–52.0)
HCT: 37 % — ABNORMAL LOW (ref 39.0–52.0)
HCT: 50 % (ref 39.0–52.0)
Hemoglobin: 10.9 g/dL — ABNORMAL LOW (ref 13.0–17.0)
Hemoglobin: 10.9 g/dL — ABNORMAL LOW (ref 13.0–17.0)
Hemoglobin: 11.6 g/dL — ABNORMAL LOW (ref 13.0–17.0)
Hemoglobin: 11.6 g/dL — ABNORMAL LOW (ref 13.0–17.0)
Hemoglobin: 11.9 g/dL — ABNORMAL LOW (ref 13.0–17.0)
Hemoglobin: 11.9 g/dL — ABNORMAL LOW (ref 13.0–17.0)
Hemoglobin: 12.2 g/dL — ABNORMAL LOW (ref 13.0–17.0)
Hemoglobin: 12.6 g/dL — ABNORMAL LOW (ref 13.0–17.0)
Hemoglobin: 12.6 g/dL — ABNORMAL LOW (ref 13.0–17.0)
Hemoglobin: 12.6 g/dL — ABNORMAL LOW (ref 13.0–17.0)
Hemoglobin: 13.3 g/dL (ref 13.0–17.0)
Hemoglobin: 13.6 g/dL (ref 13.0–17.0)
Hemoglobin: 17 g/dL (ref 13.0–17.0)
O2 Saturation: 51 %
O2 Saturation: 55 %
O2 Saturation: 58 %
O2 Saturation: 60 %
O2 Saturation: 64 %
O2 Saturation: 66 %
Patient temperature: 35.8
Patient temperature: 35.9
Patient temperature: 36.6
Patient temperature: 96
Patient temperature: 96
Patient temperature: 96
Patient temperature: 96.4
Patient temperature: 96.4
Patient temperature: 97
Patient temperature: 97
Patient temperature: 97
Patient temperature: 97.6
Potassium: 3.1 mEq/L — ABNORMAL LOW (ref 3.5–5.1)
Potassium: 3.2 mEq/L — ABNORMAL LOW (ref 3.5–5.1)
Potassium: 3.4 mEq/L — ABNORMAL LOW (ref 3.5–5.1)
Potassium: 3.4 mEq/L — ABNORMAL LOW (ref 3.5–5.1)
Sodium: 136 mEq/L (ref 135–145)
Sodium: 136 mEq/L (ref 135–145)
Sodium: 136 mEq/L (ref 135–145)
Sodium: 137 mEq/L (ref 135–145)
Sodium: 137 mEq/L (ref 135–145)
Sodium: 137 mEq/L (ref 135–145)
Sodium: 137 mEq/L (ref 135–145)
Sodium: 137 mEq/L (ref 135–145)
TCO2: 26 mmol/L (ref 0–100)
TCO2: 27 mmol/L (ref 0–100)
TCO2: 31 mmol/L (ref 0–100)
TCO2: 32 mmol/L (ref 0–100)
TCO2: 35 mmol/L (ref 0–100)
TCO2: 35 mmol/L (ref 0–100)
TCO2: 39 mmol/L (ref 0–100)
TCO2: 41 mmol/L (ref 0–100)
TCO2: 43 mmol/L (ref 0–100)
TCO2: 43 mmol/L (ref 0–100)
TCO2: 44 mmol/L (ref 0–100)
pCO2, Ven: 41.2 mmHg — ABNORMAL LOW (ref 45.0–50.0)
pCO2, Ven: 44.2 mmHg — ABNORMAL LOW (ref 45.0–50.0)
pCO2, Ven: 44.2 mmHg — ABNORMAL LOW (ref 45.0–50.0)
pCO2, Ven: 45.2 mmHg (ref 45.0–50.0)
pCO2, Ven: 45.2 mmHg (ref 45.0–50.0)
pCO2, Ven: 45.4 mmHg (ref 45.0–50.0)
pCO2, Ven: 47 mmHg (ref 45.0–50.0)
pCO2, Ven: 48.4 mmHg (ref 45.0–50.0)
pCO2, Ven: 51.1 mmHg — ABNORMAL HIGH (ref 45.0–50.0)
pCO2, Ven: 51.7 mmHg — ABNORMAL HIGH (ref 45.0–50.0)
pCO2, Ven: 55.7 mmHg — ABNORMAL HIGH (ref 45.0–50.0)
pCO2, Ven: 60.2 mmHg — ABNORMAL HIGH (ref 45.0–50.0)
pH, Ven: 7.399 — ABNORMAL HIGH (ref 7.250–7.300)
pH, Ven: 7.45 — ABNORMAL HIGH (ref 7.250–7.300)
pH, Ven: 7.456 — ABNORMAL HIGH (ref 7.250–7.300)
pH, Ven: 7.458 — ABNORMAL HIGH (ref 7.250–7.300)
pH, Ven: 7.464 — ABNORMAL HIGH (ref 7.250–7.300)
pH, Ven: 7.476 — ABNORMAL HIGH (ref 7.250–7.300)
pH, Ven: 7.48 — ABNORMAL HIGH (ref 7.250–7.300)
pH, Ven: 7.485 — ABNORMAL HIGH (ref 7.250–7.300)
pH, Ven: 7.503 — ABNORMAL HIGH (ref 7.250–7.300)
pO2, Ven: 24 mmHg — CL (ref 30.0–45.0)
pO2, Ven: 25 mmHg — CL (ref 30.0–45.0)
pO2, Ven: 27 mmHg — CL (ref 30.0–45.0)
pO2, Ven: 30 mmHg (ref 30.0–45.0)
pO2, Ven: 30 mmHg (ref 30.0–45.0)
pO2, Ven: 32 mmHg (ref 30.0–45.0)
pO2, Ven: 32 mmHg (ref 30.0–45.0)
pO2, Ven: 34 mmHg (ref 30.0–45.0)

## 2010-04-28 LAB — CBC
HCT: 23.6 % — ABNORMAL LOW (ref 39.0–52.0)
HCT: 24.6 % — ABNORMAL LOW (ref 39.0–52.0)
HCT: 24.7 % — ABNORMAL LOW (ref 39.0–52.0)
HCT: 24.8 % — ABNORMAL LOW (ref 39.0–52.0)
HCT: 26.7 % — ABNORMAL LOW (ref 39.0–52.0)
HCT: 27.2 % — ABNORMAL LOW (ref 39.0–52.0)
HCT: 29.9 % — ABNORMAL LOW (ref 39.0–52.0)
HCT: 30.5 % — ABNORMAL LOW (ref 39.0–52.0)
HCT: 31.1 % — ABNORMAL LOW (ref 39.0–52.0)
HCT: 31.4 % — ABNORMAL LOW (ref 39.0–52.0)
HCT: 31.5 % — ABNORMAL LOW (ref 39.0–52.0)
HCT: 31.5 % — ABNORMAL LOW (ref 39.0–52.0)
HCT: 34.1 % — ABNORMAL LOW (ref 39.0–52.0)
HCT: 34.9 % — ABNORMAL LOW (ref 39.0–52.0)
Hemoglobin: 10.5 g/dL — ABNORMAL LOW (ref 13.0–17.0)
Hemoglobin: 10.5 g/dL — ABNORMAL LOW (ref 13.0–17.0)
Hemoglobin: 11.5 g/dL — ABNORMAL LOW (ref 13.0–17.0)
Hemoglobin: 11.7 g/dL — ABNORMAL LOW (ref 13.0–17.0)
Hemoglobin: 8 g/dL — ABNORMAL LOW (ref 13.0–17.0)
Hemoglobin: 8.1 g/dL — ABNORMAL LOW (ref 13.0–17.0)
Hemoglobin: 8.2 g/dL — ABNORMAL LOW (ref 13.0–17.0)
Hemoglobin: 8.3 g/dL — ABNORMAL LOW (ref 13.0–17.0)
Hemoglobin: 8.4 g/dL — ABNORMAL LOW (ref 13.0–17.0)
Hemoglobin: 9 g/dL — ABNORMAL LOW (ref 13.0–17.0)
Hemoglobin: 9 g/dL — ABNORMAL LOW (ref 13.0–17.0)
Hemoglobin: 9.4 g/dL — ABNORMAL LOW (ref 13.0–17.0)
MCH: 31.3 pg (ref 26.0–34.0)
MCH: 31.4 pg (ref 26.0–34.0)
MCH: 31.6 pg (ref 26.0–34.0)
MCHC: 32.3 g/dL (ref 30.0–36.0)
MCHC: 32.7 g/dL (ref 30.0–36.0)
MCHC: 32.8 g/dL (ref 30.0–36.0)
MCHC: 32.8 g/dL (ref 30.0–36.0)
MCHC: 33 g/dL (ref 30.0–36.0)
MCHC: 33.1 g/dL (ref 30.0–36.0)
MCHC: 33.2 g/dL (ref 30.0–36.0)
MCHC: 33.2 g/dL (ref 30.0–36.0)
MCHC: 33.2 g/dL (ref 30.0–36.0)
MCHC: 33.6 g/dL (ref 30.0–36.0)
MCHC: 33.7 g/dL (ref 30.0–36.0)
MCHC: 33.8 g/dL (ref 30.0–36.0)
MCHC: 33.9 g/dL (ref 30.0–36.0)
MCHC: 34 g/dL (ref 30.0–36.0)
MCHC: 34 g/dL (ref 30.0–36.0)
MCHC: 34.8 g/dL (ref 30.0–36.0)
MCV: 91.7 fL (ref 78.0–100.0)
MCV: 93.1 fL (ref 78.0–100.0)
MCV: 93.3 fL (ref 78.0–100.0)
MCV: 93.3 fL (ref 78.0–100.0)
MCV: 93.4 fL (ref 78.0–100.0)
MCV: 93.5 fL (ref 78.0–100.0)
MCV: 93.6 fL (ref 78.0–100.0)
MCV: 93.6 fL (ref 78.0–100.0)
MCV: 94.3 fL (ref 78.0–100.0)
MCV: 94.6 fL (ref 78.0–100.0)
MCV: 94.9 fL (ref 78.0–100.0)
MCV: 95.3 fL (ref 78.0–100.0)
MCV: 95.4 fL (ref 78.0–100.0)
MCV: 96.1 fL (ref 78.0–100.0)
Platelets: 101 10*3/uL — ABNORMAL LOW (ref 150–400)
Platelets: 120 10*3/uL — ABNORMAL LOW (ref 150–400)
Platelets: 139 10*3/uL — ABNORMAL LOW (ref 150–400)
Platelets: 143 10*3/uL — ABNORMAL LOW (ref 150–400)
Platelets: 183 10*3/uL (ref 150–400)
Platelets: 220 10*3/uL (ref 150–400)
Platelets: 237 10*3/uL (ref 150–400)
Platelets: 239 10*3/uL (ref 150–400)
Platelets: 275 10*3/uL (ref 150–400)
Platelets: 278 10*3/uL (ref 150–400)
Platelets: 298 10*3/uL (ref 150–400)
Platelets: 361 10*3/uL (ref 150–400)
Platelets: 42 10*3/uL — ABNORMAL LOW (ref 150–400)
Platelets: 52 10*3/uL — ABNORMAL LOW (ref 150–400)
Platelets: 73 10*3/uL — ABNORMAL LOW (ref 150–400)
Platelets: 80 10*3/uL — ABNORMAL LOW (ref 150–400)
Platelets: 81 10*3/uL — ABNORMAL LOW (ref 150–400)
RBC: 2.55 MIL/uL — ABNORMAL LOW (ref 4.22–5.81)
RBC: 2.61 MIL/uL — ABNORMAL LOW (ref 4.22–5.81)
RBC: 2.66 MIL/uL — ABNORMAL LOW (ref 4.22–5.81)
RBC: 2.67 MIL/uL — ABNORMAL LOW (ref 4.22–5.81)
RBC: 2.86 MIL/uL — ABNORMAL LOW (ref 4.22–5.81)
RBC: 2.91 MIL/uL — ABNORMAL LOW (ref 4.22–5.81)
RBC: 3.17 MIL/uL — ABNORMAL LOW (ref 4.22–5.81)
RBC: 3.17 MIL/uL — ABNORMAL LOW (ref 4.22–5.81)
RBC: 3.33 MIL/uL — ABNORMAL LOW (ref 4.22–5.81)
RBC: 3.49 MIL/uL — ABNORMAL LOW (ref 4.22–5.81)
RBC: 3.6 MIL/uL — ABNORMAL LOW (ref 4.22–5.81)
RDW: 14.3 % (ref 11.5–15.5)
RDW: 14.7 % (ref 11.5–15.5)
RDW: 14.7 % (ref 11.5–15.5)
RDW: 14.8 % (ref 11.5–15.5)
RDW: 15 % (ref 11.5–15.5)
RDW: 15.1 % (ref 11.5–15.5)
RDW: 15.8 % — ABNORMAL HIGH (ref 11.5–15.5)
RDW: 16.2 % — ABNORMAL HIGH (ref 11.5–15.5)
RDW: 16.4 % — ABNORMAL HIGH (ref 11.5–15.5)
RDW: 16.7 % — ABNORMAL HIGH (ref 11.5–15.5)
RDW: 16.9 % — ABNORMAL HIGH (ref 11.5–15.5)
RDW: 17.2 % — ABNORMAL HIGH (ref 11.5–15.5)
RDW: 17.6 % — ABNORMAL HIGH (ref 11.5–15.5)
RDW: 17.8 % — ABNORMAL HIGH (ref 11.5–15.5)
RDW: 18 % — ABNORMAL HIGH (ref 11.5–15.5)
WBC: 11.3 10*3/uL — ABNORMAL HIGH (ref 4.0–10.5)
WBC: 11.5 10*3/uL — ABNORMAL HIGH (ref 4.0–10.5)
WBC: 12.8 10*3/uL — ABNORMAL HIGH (ref 4.0–10.5)
WBC: 13.2 10*3/uL — ABNORMAL HIGH (ref 4.0–10.5)
WBC: 15.5 10*3/uL — ABNORMAL HIGH (ref 4.0–10.5)
WBC: 15.7 10*3/uL — ABNORMAL HIGH (ref 4.0–10.5)
WBC: 7.5 10*3/uL (ref 4.0–10.5)
WBC: 7.7 10*3/uL (ref 4.0–10.5)
WBC: 7.9 10*3/uL (ref 4.0–10.5)
WBC: 8.6 10*3/uL (ref 4.0–10.5)
WBC: 8.9 10*3/uL (ref 4.0–10.5)

## 2010-04-28 LAB — BLOOD GAS, ARTERIAL
Acid-base deficit: 11.1 mmol/L — ABNORMAL HIGH (ref 0.0–2.0)
Acid-base deficit: 13.4 mmol/L — ABNORMAL HIGH (ref 0.0–2.0)
Acid-base deficit: 13.6 mmol/L — ABNORMAL HIGH (ref 0.0–2.0)
Drawn by: 32526
FIO2: 0.4 %
FIO2: 1 %
MECHVT: 0.65 mL
MECHVT: 650 mL
MECHVT: 650 mL
MECHVT: 650 mL
PEEP: 5 cmH2O
PEEP: 5 cmH2O
Patient temperature: 98.6
RATE: 14 resp/min
RATE: 14 resp/min
RATE: 14 resp/min
TCO2: 10.8 mmol/L (ref 0–100)
pCO2 arterial: 25.1 mmHg — ABNORMAL LOW (ref 35.0–45.0)
pCO2 arterial: 35.6 mmHg (ref 35.0–45.0)
pCO2 arterial: 37.2 mmHg (ref 35.0–45.0)
pCO2 arterial: 38.4 mmHg (ref 35.0–45.0)
pH, Arterial: 7.177 — CL (ref 7.350–7.450)
pH, Arterial: 7.234 — ABNORMAL LOW (ref 7.350–7.450)
pH, Arterial: 7.283 — ABNORMAL LOW (ref 7.350–7.450)

## 2010-04-28 LAB — POCT I-STAT 7, (LYTES, BLD GAS, ICA,H+H)
Acid-Base Excess: 1 mmol/L (ref 0.0–2.0)
Acid-Base Excess: 10 mmol/L — ABNORMAL HIGH (ref 0.0–2.0)
Acid-Base Excess: 12 mmol/L — ABNORMAL HIGH (ref 0.0–2.0)
Acid-Base Excess: 17 mmol/L — ABNORMAL HIGH (ref 0.0–2.0)
Acid-Base Excess: 17 mmol/L — ABNORMAL HIGH (ref 0.0–2.0)
Acid-Base Excess: 17 mmol/L — ABNORMAL HIGH (ref 0.0–2.0)
Acid-Base Excess: 18 mmol/L — ABNORMAL HIGH (ref 0.0–2.0)
Acid-Base Excess: 20 mmol/L — ABNORMAL HIGH (ref 0.0–2.0)
Acid-Base Excess: 5 mmol/L — ABNORMAL HIGH (ref 0.0–2.0)
Acid-Base Excess: 5 mmol/L — ABNORMAL HIGH (ref 0.0–2.0)
Acid-Base Excess: 8 mmol/L — ABNORMAL HIGH (ref 0.0–2.0)
Acid-base deficit: 2 mmol/L (ref 0.0–2.0)
Acid-base deficit: 2 mmol/L (ref 0.0–2.0)
Bicarbonate: 21.8 mEq/L (ref 20.0–24.0)
Bicarbonate: 22.4 mEq/L (ref 20.0–24.0)
Bicarbonate: 24 mEq/L (ref 20.0–24.0)
Bicarbonate: 24.1 mEq/L — ABNORMAL HIGH (ref 20.0–24.0)
Bicarbonate: 28.9 mEq/L — ABNORMAL HIGH (ref 20.0–24.0)
Bicarbonate: 31.8 mEq/L — ABNORMAL HIGH (ref 20.0–24.0)
Bicarbonate: 33 mEq/L — ABNORMAL HIGH (ref 20.0–24.0)
Bicarbonate: 35.7 mEq/L — ABNORMAL HIGH (ref 20.0–24.0)
Bicarbonate: 37.8 mEq/L — ABNORMAL HIGH (ref 20.0–24.0)
Bicarbonate: 40.5 mEq/L — ABNORMAL HIGH (ref 20.0–24.0)
Bicarbonate: 41.9 mEq/L — ABNORMAL HIGH (ref 20.0–24.0)
Bicarbonate: 44.7 mEq/L — ABNORMAL HIGH (ref 20.0–24.0)
Calcium, Ion: 0.78 mmol/L — ABNORMAL LOW (ref 1.12–1.32)
Calcium, Ion: 0.91 mmol/L — ABNORMAL LOW (ref 1.12–1.32)
Calcium, Ion: 0.97 mmol/L — ABNORMAL LOW (ref 1.12–1.32)
Calcium, Ion: 1.11 mmol/L — ABNORMAL LOW (ref 1.12–1.32)
Calcium, Ion: 1.13 mmol/L (ref 1.12–1.32)
Calcium, Ion: 1.17 mmol/L (ref 1.12–1.32)
Calcium, Ion: 1.2 mmol/L (ref 1.12–1.32)
Calcium, Ion: 1.22 mmol/L (ref 1.12–1.32)
HCT: 28 % — ABNORMAL LOW (ref 39.0–52.0)
HCT: 28 % — ABNORMAL LOW (ref 39.0–52.0)
HCT: 29 % — ABNORMAL LOW (ref 39.0–52.0)
HCT: 29 % — ABNORMAL LOW (ref 39.0–52.0)
HCT: 30 % — ABNORMAL LOW (ref 39.0–52.0)
HCT: 30 % — ABNORMAL LOW (ref 39.0–52.0)
HCT: 30 % — ABNORMAL LOW (ref 39.0–52.0)
HCT: 35 % — ABNORMAL LOW (ref 39.0–52.0)
Hemoglobin: 10.2 g/dL — ABNORMAL LOW (ref 13.0–17.0)
Hemoglobin: 10.5 g/dL — ABNORMAL LOW (ref 13.0–17.0)
Hemoglobin: 10.9 g/dL — ABNORMAL LOW (ref 13.0–17.0)
Hemoglobin: 11.6 g/dL — ABNORMAL LOW (ref 13.0–17.0)
Hemoglobin: 11.9 g/dL — ABNORMAL LOW (ref 13.0–17.0)
Hemoglobin: 9.9 g/dL — ABNORMAL LOW (ref 13.0–17.0)
O2 Saturation: 71 %
O2 Saturation: 96 %
O2 Saturation: 96 %
O2 Saturation: 97 %
O2 Saturation: 97 %
O2 Saturation: 97 %
O2 Saturation: 97 %
O2 Saturation: 97 %
O2 Saturation: 97 %
O2 Saturation: 97 %
O2 Saturation: 97 %
O2 Saturation: 98 %
O2 Saturation: 98 %
Patient temperature: 35.8
Patient temperature: 35.9
Patient temperature: 36.2
Patient temperature: 36.2
Patient temperature: 36.7
Patient temperature: 97
Patient temperature: 97
Patient temperature: 97
Potassium: 3.2 mEq/L — ABNORMAL LOW (ref 3.5–5.1)
Potassium: 3.2 mEq/L — ABNORMAL LOW (ref 3.5–5.1)
Potassium: 3.3 mEq/L — ABNORMAL LOW (ref 3.5–5.1)
Potassium: 3.3 mEq/L — ABNORMAL LOW (ref 3.5–5.1)
Potassium: 3.5 mEq/L (ref 3.5–5.1)
Potassium: 3.5 mEq/L (ref 3.5–5.1)
Potassium: 3.6 mEq/L (ref 3.5–5.1)
Potassium: 3.7 mEq/L (ref 3.5–5.1)
Potassium: 3.8 mEq/L (ref 3.5–5.1)
Potassium: 3.8 mEq/L (ref 3.5–5.1)
Potassium: 3.9 mEq/L (ref 3.5–5.1)
Potassium: 4.3 mEq/L (ref 3.5–5.1)
Potassium: 4.4 mEq/L (ref 3.5–5.1)
Sodium: 135 mEq/L (ref 135–145)
Sodium: 135 mEq/L (ref 135–145)
Sodium: 135 mEq/L (ref 135–145)
Sodium: 135 mEq/L (ref 135–145)
Sodium: 136 mEq/L (ref 135–145)
Sodium: 136 mEq/L (ref 135–145)
Sodium: 136 mEq/L (ref 135–145)
Sodium: 136 mEq/L (ref 135–145)
Sodium: 136 mEq/L (ref 135–145)
Sodium: 136 mEq/L (ref 135–145)
Sodium: 136 mEq/L (ref 135–145)
Sodium: 137 mEq/L (ref 135–145)
TCO2: 25 mmol/L (ref 0–100)
TCO2: 34 mmol/L (ref 0–100)
TCO2: 37 mmol/L (ref 0–100)
TCO2: 39 mmol/L (ref 0–100)
TCO2: 43 mmol/L (ref 0–100)
TCO2: 43 mmol/L (ref 0–100)
TCO2: 44 mmol/L (ref 0–100)
pCO2 arterial: 34.9 mmHg — ABNORMAL LOW (ref 35.0–45.0)
pCO2 arterial: 41.2 mmHg (ref 35.0–45.0)
pCO2 arterial: 43.6 mmHg (ref 35.0–45.0)
pCO2 arterial: 44.7 mmHg (ref 35.0–45.0)
pCO2 arterial: 47.7 mmHg — ABNORMAL HIGH (ref 35.0–45.0)
pCO2 arterial: 47.9 mmHg — ABNORMAL HIGH (ref 35.0–45.0)
pCO2 arterial: 49.8 mmHg — ABNORMAL HIGH (ref 35.0–45.0)
pCO2 arterial: 51.2 mmHg — ABNORMAL HIGH (ref 35.0–45.0)
pH, Arterial: 7.412 (ref 7.350–7.450)
pH, Arterial: 7.414 (ref 7.350–7.450)
pH, Arterial: 7.515 — ABNORMAL HIGH (ref 7.350–7.450)
pH, Arterial: 7.531 — ABNORMAL HIGH (ref 7.350–7.450)
pH, Arterial: 7.539 — ABNORMAL HIGH (ref 7.350–7.450)
pH, Arterial: 7.543 — ABNORMAL HIGH (ref 7.350–7.450)
pH, Arterial: 7.577 — ABNORMAL HIGH (ref 7.350–7.450)
pH, Arterial: 7.578 — ABNORMAL HIGH (ref 7.350–7.450)
pO2, Arterial: 77 mmHg — ABNORMAL LOW (ref 80.0–100.0)
pO2, Arterial: 77 mmHg — ABNORMAL LOW (ref 80.0–100.0)
pO2, Arterial: 77 mmHg — ABNORMAL LOW (ref 80.0–100.0)
pO2, Arterial: 83 mmHg (ref 80.0–100.0)
pO2, Arterial: 83 mmHg (ref 80.0–100.0)

## 2010-04-28 LAB — COMPREHENSIVE METABOLIC PANEL
ALT: 138 U/L — ABNORMAL HIGH (ref 0–53)
ALT: 18 U/L (ref 0–53)
ALT: 22 U/L (ref 0–53)
ALT: 3477 U/L — ABNORMAL HIGH (ref 0–53)
AST: 25 U/L (ref 0–37)
AST: 30 U/L (ref 0–37)
AST: 8956 U/L — ABNORMAL HIGH (ref 0–37)
Albumin: 2.3 g/dL — ABNORMAL LOW (ref 3.5–5.2)
Albumin: 2 g/dL — ABNORMAL LOW (ref 3.5–5.2)
Alkaline Phosphatase: 27 U/L — ABNORMAL LOW (ref 39–117)
Alkaline Phosphatase: 44 U/L (ref 39–117)
Alkaline Phosphatase: 48 U/L (ref 39–117)
Alkaline Phosphatase: 63 U/L (ref 39–117)
BUN: 17 mg/dL (ref 6–23)
BUN: 33 mg/dL — ABNORMAL HIGH (ref 6–23)
BUN: 40 mg/dL — ABNORMAL HIGH (ref 6–23)
BUN: 51 mg/dL — ABNORMAL HIGH (ref 6–23)
BUN: 53 mg/dL — ABNORMAL HIGH (ref 6–23)
BUN: 54 mg/dL — ABNORMAL HIGH (ref 6–23)
CO2: 17 mEq/L — ABNORMAL LOW (ref 19–32)
CO2: 21 mEq/L (ref 19–32)
CO2: 21 mEq/L (ref 19–32)
CO2: 26 mEq/L (ref 19–32)
CO2: 27 mEq/L (ref 19–32)
CO2: 28 mEq/L (ref 19–32)
CO2: 28 mEq/L (ref 19–32)
Calcium: 5.4 mg/dL — CL (ref 8.4–10.5)
Calcium: 5.8 mg/dL — CL (ref 8.4–10.5)
Calcium: 7.2 mg/dL — ABNORMAL LOW (ref 8.4–10.5)
Calcium: 8.2 mg/dL — ABNORMAL LOW (ref 8.4–10.5)
Calcium: 8.4 mg/dL (ref 8.4–10.5)
Calcium: 8.9 mg/dL (ref 8.4–10.5)
Chloride: 100 mEq/L (ref 96–112)
Chloride: 106 mEq/L (ref 96–112)
Chloride: 99 mEq/L (ref 96–112)
Creatinine, Ser: 4.15 mg/dL — ABNORMAL HIGH (ref 0.4–1.5)
Creatinine, Ser: 4.49 mg/dL — ABNORMAL HIGH (ref 0.4–1.5)
Creatinine, Ser: 4.88 mg/dL — ABNORMAL HIGH (ref 0.4–1.5)
Creatinine, Ser: 5.49 mg/dL — ABNORMAL HIGH (ref 0.4–1.5)
Creatinine, Ser: 5.98 mg/dL — ABNORMAL HIGH (ref 0.4–1.5)
Creatinine, Ser: 6.01 mg/dL — ABNORMAL HIGH (ref 0.4–1.5)
GFR calc Af Amer: 13 mL/min — ABNORMAL LOW (ref >60)
GFR calc Af Amer: 16 mL/min — ABNORMAL LOW (ref >60)
GFR calc non Af Amer: 10 mL/min — ABNORMAL LOW (ref >60)
GFR calc non Af Amer: 11 mL/min — ABNORMAL LOW (ref >60)
GFR calc non Af Amer: 13 mL/min — ABNORMAL LOW (ref >60)
GFR calc non Af Amer: 14 mL/min — ABNORMAL LOW (ref >60)
GFR calc non Af Amer: 46 mL/min — ABNORMAL LOW (ref >60)
GFR calc non Af Amer: 9 mL/min — ABNORMAL LOW (ref >60)
GFR calc non Af Amer: 9 mL/min — ABNORMAL LOW (ref >60)
Glucose, Bld: 119 mg/dL — ABNORMAL HIGH (ref 70–99)
Glucose, Bld: 137 mg/dL — ABNORMAL HIGH (ref 70–99)
Glucose, Bld: 158 mg/dL — ABNORMAL HIGH (ref 70–99)
Glucose, Bld: 187 mg/dL — ABNORMAL HIGH (ref 70–99)
Glucose, Bld: 203 mg/dL — ABNORMAL HIGH (ref 70–99)
Glucose, Bld: 414 mg/dL — ABNORMAL HIGH (ref 70–99)
Potassium: 5.4 mEq/L — ABNORMAL HIGH (ref 3.5–5.1)
Potassium: 5 mEq/L (ref 3.5–5.1)
Sodium: 132 mEq/L — ABNORMAL LOW (ref 135–145)
Sodium: 132 mEq/L — ABNORMAL LOW (ref 135–145)
Sodium: 134 mEq/L — ABNORMAL LOW (ref 135–145)
Sodium: 135 mEq/L (ref 135–145)
Sodium: 135 mEq/L (ref 135–145)
Total Bilirubin: 0.6 mg/dL (ref 0.3–1.2)
Total Bilirubin: 1 mg/dL (ref 0.3–1.2)
Total Bilirubin: 1.5 mg/dL — ABNORMAL HIGH (ref 0.3–1.2)
Total Protein: 6.2 g/dL (ref 6.0–8.3)
Total Protein: 6.2 g/dL (ref 6.0–8.3)
Total Protein: 6.5 g/dL (ref 6.0–8.3)

## 2010-04-28 LAB — PREPARE FRESH FROZEN PLASMA

## 2010-04-28 LAB — BASIC METABOLIC PANEL
BUN: 14 mg/dL (ref 6–23)
BUN: 24 mg/dL — ABNORMAL HIGH (ref 6–23)
BUN: 25 mg/dL — ABNORMAL HIGH (ref 6–23)
BUN: 32 mg/dL — ABNORMAL HIGH (ref 6–23)
BUN: 66 mg/dL — ABNORMAL HIGH (ref 6–23)
CO2: 11 mEq/L — ABNORMAL LOW (ref 19–32)
CO2: 12 mEq/L — ABNORMAL LOW (ref 19–32)
CO2: 14 mEq/L — ABNORMAL LOW (ref 19–32)
CO2: 21 mEq/L (ref 19–32)
CO2: 24 mEq/L (ref 19–32)
Calcium: 5.9 mg/dL — CL (ref 8.4–10.5)
Calcium: 6.9 mg/dL — ABNORMAL LOW (ref 8.4–10.5)
Calcium: 7.7 mg/dL — ABNORMAL LOW (ref 8.4–10.5)
Calcium: 7.9 mg/dL — ABNORMAL LOW (ref 8.4–10.5)
Chloride: 100 mEq/L (ref 96–112)
Chloride: 105 mEq/L (ref 96–112)
Chloride: 107 mEq/L (ref 96–112)
Chloride: 111 mEq/L (ref 96–112)
Creatinine, Ser: 3.25 mg/dL — ABNORMAL HIGH (ref 0.4–1.5)
Creatinine, Ser: 3.4 mg/dL — ABNORMAL HIGH (ref 0.4–1.5)
Creatinine, Ser: 4.52 mg/dL — ABNORMAL HIGH (ref 0.4–1.5)
Creatinine, Ser: 4.63 mg/dL — ABNORMAL HIGH (ref 0.4–1.5)
Creatinine, Ser: 6.69 mg/dL — ABNORMAL HIGH (ref 0.4–1.5)
GFR calc Af Amer: 10 mL/min — ABNORMAL LOW (ref >60)
GFR calc Af Amer: 18 mL/min — ABNORMAL LOW (ref >60)
GFR calc Af Amer: 22 mL/min — ABNORMAL LOW (ref >60)
GFR calc non Af Amer: 12 mL/min — ABNORMAL LOW (ref >60)
GFR calc non Af Amer: 19 mL/min — ABNORMAL LOW (ref >60)
GFR calc non Af Amer: 27 mL/min — ABNORMAL LOW (ref >60)
GFR calc non Af Amer: 8 mL/min — ABNORMAL LOW (ref >60)
Glucose, Bld: 130 mg/dL — ABNORMAL HIGH (ref 70–99)
Glucose, Bld: 176 mg/dL — ABNORMAL HIGH (ref 70–99)
Glucose, Bld: 203 mg/dL — ABNORMAL HIGH (ref 70–99)
Glucose, Bld: 221 mg/dL — ABNORMAL HIGH (ref 70–99)
Potassium: 5 mEq/L (ref 3.5–5.1)
Potassium: 5.4 mEq/L — ABNORMAL HIGH (ref 3.5–5.1)
Potassium: 6 mEq/L — ABNORMAL HIGH (ref 3.5–5.1)
Sodium: 134 mEq/L — ABNORMAL LOW (ref 135–145)
Sodium: 135 mEq/L (ref 135–145)
Sodium: 135 mEq/L (ref 135–145)

## 2010-04-28 LAB — URINE CULTURE: Colony Count: 40000

## 2010-04-28 LAB — RENAL FUNCTION PANEL
Albumin: 1.3 g/dL — ABNORMAL LOW (ref 3.5–5.2)
Albumin: 1.5 g/dL — ABNORMAL LOW (ref 3.5–5.2)
Albumin: 1.5 g/dL — ABNORMAL LOW (ref 3.5–5.2)
Albumin: 1.6 g/dL — ABNORMAL LOW (ref 3.5–5.2)
Albumin: 1.6 g/dL — ABNORMAL LOW (ref 3.5–5.2)
Albumin: 1.7 g/dL — ABNORMAL LOW (ref 3.5–5.2)
Albumin: 1.7 g/dL — ABNORMAL LOW (ref 3.5–5.2)
Albumin: 2.3 g/dL — ABNORMAL LOW (ref 3.5–5.2)
BUN: 36 mg/dL — ABNORMAL HIGH (ref 6–23)
BUN: 41 mg/dL — ABNORMAL HIGH (ref 6–23)
BUN: 43 mg/dL — ABNORMAL HIGH (ref 6–23)
BUN: 46 mg/dL — ABNORMAL HIGH (ref 6–23)
BUN: 48 mg/dL — ABNORMAL HIGH (ref 6–23)
BUN: 49 mg/dL — ABNORMAL HIGH (ref 6–23)
BUN: 51 mg/dL — ABNORMAL HIGH (ref 6–23)
BUN: 52 mg/dL — ABNORMAL HIGH (ref 6–23)
BUN: 54 mg/dL — ABNORMAL HIGH (ref 6–23)
BUN: 55 mg/dL — ABNORMAL HIGH (ref 6–23)
CO2: 20 mEq/L (ref 19–32)
CO2: 22 mEq/L (ref 19–32)
CO2: 22 mEq/L (ref 19–32)
CO2: 23 mEq/L (ref 19–32)
CO2: 23 mEq/L (ref 19–32)
CO2: 24 mEq/L (ref 19–32)
CO2: 25 mEq/L (ref 19–32)
CO2: 30 mEq/L (ref 19–32)
CO2: 31 mEq/L (ref 19–32)
CO2: 34 mEq/L — ABNORMAL HIGH (ref 19–32)
CO2: 34 mEq/L — ABNORMAL HIGH (ref 19–32)
CO2: 38 mEq/L — ABNORMAL HIGH (ref 19–32)
Calcium: 10.5 mg/dL (ref 8.4–10.5)
Calcium: 4.6 mg/dL — CL (ref 8.4–10.5)
Calcium: 5 mg/dL — CL (ref 8.4–10.5)
Calcium: 7.1 mg/dL — ABNORMAL LOW (ref 8.4–10.5)
Calcium: 7.5 mg/dL — ABNORMAL LOW (ref 8.4–10.5)
Calcium: 7.7 mg/dL — ABNORMAL LOW (ref 8.4–10.5)
Calcium: 7.9 mg/dL — ABNORMAL LOW (ref 8.4–10.5)
Calcium: 8.8 mg/dL (ref 8.4–10.5)
Calcium: 9.1 mg/dL (ref 8.4–10.5)
Chloride: 101 mEq/L (ref 96–112)
Chloride: 102 mEq/L (ref 96–112)
Chloride: 105 mEq/L (ref 96–112)
Chloride: 105 mEq/L (ref 96–112)
Chloride: 106 mEq/L (ref 96–112)
Chloride: 106 mEq/L (ref 96–112)
Chloride: 107 mEq/L (ref 96–112)
Chloride: 86 mEq/L — ABNORMAL LOW (ref 96–112)
Chloride: 98 mEq/L (ref 96–112)
Chloride: 99 mEq/L (ref 96–112)
Chloride: 99 mEq/L (ref 96–112)
Creatinine, Ser: 2.92 mg/dL — ABNORMAL HIGH (ref 0.4–1.5)
Creatinine, Ser: 3.88 mg/dL — ABNORMAL HIGH (ref 0.4–1.5)
Creatinine, Ser: 4.19 mg/dL — ABNORMAL HIGH (ref 0.4–1.5)
Creatinine, Ser: 4.27 mg/dL — ABNORMAL HIGH (ref 0.4–1.5)
Creatinine, Ser: 4.28 mg/dL — ABNORMAL HIGH (ref 0.4–1.5)
Creatinine, Ser: 4.29 mg/dL — ABNORMAL HIGH (ref 0.4–1.5)
Creatinine, Ser: 4.56 mg/dL — ABNORMAL HIGH (ref 0.4–1.5)
Creatinine, Ser: 4.58 mg/dL — ABNORMAL HIGH (ref 0.4–1.5)
Creatinine, Ser: 4.91 mg/dL — ABNORMAL HIGH (ref 0.4–1.5)
Creatinine, Ser: 5.16 mg/dL — ABNORMAL HIGH (ref 0.4–1.5)
Creatinine, Ser: 5.39 mg/dL — ABNORMAL HIGH (ref 0.4–1.5)
Creatinine, Ser: 6.32 mg/dL — ABNORMAL HIGH (ref 0.4–1.5)
Creatinine, Ser: 8.29 mg/dL — ABNORMAL HIGH (ref 0.4–1.5)
GFR calc Af Amer: 12 mL/min — ABNORMAL LOW (ref >60)
GFR calc Af Amer: 13 mL/min — ABNORMAL LOW (ref >60)
GFR calc Af Amer: 14 mL/min — ABNORMAL LOW (ref >60)
GFR calc Af Amer: 16 mL/min — ABNORMAL LOW (ref >60)
GFR calc Af Amer: 16 mL/min — ABNORMAL LOW (ref >60)
GFR calc Af Amer: 17 mL/min — ABNORMAL LOW (ref >60)
GFR calc Af Amer: 17 mL/min — ABNORMAL LOW (ref >60)
GFR calc Af Amer: 18 mL/min — ABNORMAL LOW (ref >60)
GFR calc Af Amer: 20 mL/min — ABNORMAL LOW (ref >60)
GFR calc Af Amer: 26 mL/min — ABNORMAL LOW (ref >60)
GFR calc Af Amer: 8 mL/min — ABNORMAL LOW (ref >60)
GFR calc non Af Amer: 10 mL/min — ABNORMAL LOW (ref >60)
GFR calc non Af Amer: 11 mL/min — ABNORMAL LOW (ref >60)
GFR calc non Af Amer: 11 mL/min — ABNORMAL LOW (ref >60)
GFR calc non Af Amer: 13 mL/min — ABNORMAL LOW (ref >60)
GFR calc non Af Amer: 13 mL/min — ABNORMAL LOW (ref >60)
GFR calc non Af Amer: 14 mL/min — ABNORMAL LOW (ref >60)
GFR calc non Af Amer: 14 mL/min — ABNORMAL LOW (ref >60)
GFR calc non Af Amer: 16 mL/min — ABNORMAL LOW (ref >60)
GFR calc non Af Amer: 22 mL/min — ABNORMAL LOW (ref >60)
GFR calc non Af Amer: 6 mL/min — ABNORMAL LOW (ref >60)
Glucose, Bld: 127 mg/dL — ABNORMAL HIGH (ref 70–99)
Glucose, Bld: 150 mg/dL — ABNORMAL HIGH (ref 70–99)
Glucose, Bld: 152 mg/dL — ABNORMAL HIGH (ref 70–99)
Glucose, Bld: 158 mg/dL — ABNORMAL HIGH (ref 70–99)
Glucose, Bld: 170 mg/dL — ABNORMAL HIGH (ref 70–99)
Glucose, Bld: 187 mg/dL — ABNORMAL HIGH (ref 70–99)
Glucose, Bld: 91 mg/dL (ref 70–99)
Phosphorus: 3.4 mg/dL (ref 2.3–4.6)
Phosphorus: 4.4 mg/dL (ref 2.3–4.6)
Phosphorus: 4.4 mg/dL (ref 2.3–4.6)
Phosphorus: 6.6 mg/dL — ABNORMAL HIGH (ref 2.3–4.6)
Potassium: 3.4 mEq/L — ABNORMAL LOW (ref 3.5–5.1)
Potassium: 3.9 mEq/L (ref 3.5–5.1)
Potassium: 4.2 mEq/L (ref 3.5–5.1)
Potassium: 4.3 mEq/L (ref 3.5–5.1)
Potassium: 4.3 mEq/L (ref 3.5–5.1)
Potassium: 4.3 mEq/L (ref 3.5–5.1)
Potassium: 4.4 mEq/L (ref 3.5–5.1)
Potassium: 4.5 mEq/L (ref 3.5–5.1)
Sodium: 131 mEq/L — ABNORMAL LOW (ref 135–145)
Sodium: 134 mEq/L — ABNORMAL LOW (ref 135–145)
Sodium: 134 mEq/L — ABNORMAL LOW (ref 135–145)
Sodium: 135 mEq/L (ref 135–145)
Sodium: 136 mEq/L (ref 135–145)
Sodium: 136 mEq/L (ref 135–145)
Sodium: 136 mEq/L (ref 135–145)
Sodium: 136 mEq/L (ref 135–145)

## 2010-04-28 LAB — MAGNESIUM
Magnesium: 1.2 mg/dL — ABNORMAL LOW (ref 1.5–2.5)
Magnesium: 2.2 mg/dL (ref 1.5–2.5)
Magnesium: 2.2 mg/dL (ref 1.5–2.5)
Magnesium: 2.3 mg/dL (ref 1.5–2.5)
Magnesium: 2.6 mg/dL — ABNORMAL HIGH (ref 1.5–2.5)

## 2010-04-28 LAB — PHOSPHORUS
Phosphorus: 2.5 mg/dL (ref 2.3–4.6)
Phosphorus: 3 mg/dL (ref 2.3–4.6)
Phosphorus: 4.4 mg/dL (ref 2.3–4.6)
Phosphorus: 5.6 mg/dL — ABNORMAL HIGH (ref 2.3–4.6)
Phosphorus: 6.3 mg/dL — ABNORMAL HIGH (ref 2.3–4.6)
Phosphorus: 7.9 mg/dL — ABNORMAL HIGH (ref 2.3–4.6)

## 2010-04-28 LAB — CARDIAC PANEL(CRET KIN+CKTOT+MB+TROPI)
CK, MB: 3.2 ng/mL (ref 0.3–4.0)
CK, MB: 3.2 ng/mL (ref 0.3–4.0)
CK, MB: 6.1 ng/mL (ref 0.3–4.0)
Relative Index: 0.7 (ref 0.0–2.5)
Relative Index: INVALID (ref 0.0–2.5)
Relative Index: 0.5 (ref 0.0–2.5)
Total CK: 32 U/L (ref 7–232)
Total CK: 463 U/L — ABNORMAL HIGH (ref 7–232)
Total CK: 817 U/L — ABNORMAL HIGH (ref 7–232)
Troponin I: 0.46 ng/mL — ABNORMAL HIGH (ref 0.00–0.06)
Troponin I: 0.56 ng/mL (ref 0.00–0.06)
Troponin I: 0.34 ng/mL — ABNORMAL HIGH (ref 0.00–0.06)

## 2010-04-28 LAB — IRON AND TIBC
Iron: 24 ug/dL — ABNORMAL LOW (ref 42–135)
Saturation Ratios: 20 % (ref 20–55)
TIBC: 121 ug/dL — ABNORMAL LOW (ref 215–435)

## 2010-04-28 LAB — HEPATIC FUNCTION PANEL
ALT: 191 U/L — ABNORMAL HIGH (ref 0–53)
Albumin: 1.5 g/dL — ABNORMAL LOW (ref 3.5–5.2)
Alkaline Phosphatase: 59 U/L (ref 39–117)
Total Protein: 5.6 g/dL — ABNORMAL LOW (ref 6.0–8.3)

## 2010-04-28 LAB — PROTIME-INR
INR: 1.32 (ref 0.00–1.49)
INR: 1.33 (ref 0.00–1.49)
INR: 1.62 — ABNORMAL HIGH (ref 0.00–1.49)
INR: 1.83 — ABNORMAL HIGH (ref 0.00–1.49)
INR: 1.84 — ABNORMAL HIGH (ref 0.00–1.49)
INR: 1.87 — ABNORMAL HIGH (ref 0.00–1.49)
INR: 2.19 — ABNORMAL HIGH (ref 0.00–1.49)
INR: 2.95 — ABNORMAL HIGH (ref 0.00–1.49)
Prothrombin Time: 16 seconds — ABNORMAL HIGH (ref 11.6–15.2)
Prothrombin Time: 16 seconds — ABNORMAL HIGH (ref 11.6–15.2)
Prothrombin Time: 16.3 seconds — ABNORMAL HIGH (ref 11.6–15.2)
Prothrombin Time: 20.1 seconds — ABNORMAL HIGH (ref 11.6–15.2)
Prothrombin Time: 20.9 seconds — ABNORMAL HIGH (ref 11.6–15.2)
Prothrombin Time: 21 seconds — ABNORMAL HIGH (ref 11.6–15.2)
Prothrombin Time: 24.2 seconds — ABNORMAL HIGH (ref 11.6–15.2)
Prothrombin Time: 26.5 seconds — ABNORMAL HIGH (ref 11.6–15.2)

## 2010-04-28 LAB — DIFFERENTIAL
Basophils Absolute: 0 10*3/uL (ref 0.0–0.1)
Basophils Absolute: 0 10*3/uL (ref 0.0–0.1)
Basophils Absolute: 0 10*3/uL (ref 0.0–0.1)
Basophils Relative: 0 % (ref 0–1)
Basophils Relative: 0 % (ref 0–1)
Basophils Relative: 0 % (ref 0–1)
Basophils Relative: 0 % (ref 0–1)
Eosinophils Absolute: 0 10*3/uL (ref 0.0–0.7)
Eosinophils Absolute: 0 10*3/uL (ref 0.0–0.7)
Eosinophils Absolute: 0 10*3/uL (ref 0.0–0.7)
Eosinophils Absolute: 0.1 10*3/uL (ref 0.0–0.7)
Eosinophils Absolute: 0.1 10*3/uL (ref 0.0–0.7)
Eosinophils Relative: 0 % (ref 0–5)
Eosinophils Relative: 0 % (ref 0–5)
Eosinophils Relative: 1 % (ref 0–5)
Eosinophils Relative: 2 % (ref 0–5)
Lymphocytes Relative: 4 % — ABNORMAL LOW (ref 12–46)
Lymphocytes Relative: 4 % — ABNORMAL LOW (ref 12–46)
Lymphocytes Relative: 9 % — ABNORMAL LOW (ref 12–46)
Lymphs Abs: 0.6 10*3/uL — ABNORMAL LOW (ref 0.7–4.0)
Lymphs Abs: 0.7 10*3/uL (ref 0.7–4.0)
Lymphs Abs: 0.7 10*3/uL (ref 0.7–4.0)
Lymphs Abs: 0.9 10*3/uL (ref 0.7–4.0)
Lymphs Abs: 1.3 10*3/uL (ref 0.7–4.0)
Monocytes Absolute: 0.8 10*3/uL (ref 0.1–1.0)
Monocytes Absolute: 0.8 10*3/uL (ref 0.1–1.0)
Monocytes Absolute: 0.9 10*3/uL (ref 0.1–1.0)
Monocytes Absolute: 1 10*3/uL (ref 0.1–1.0)
Monocytes Relative: 11 % (ref 3–12)
Monocytes Relative: 6 % (ref 3–12)
Monocytes Relative: 7 % (ref 3–12)
Neutro Abs: 14 10*3/uL — ABNORMAL HIGH (ref 1.7–7.7)
Neutrophils Relative %: 83 % — ABNORMAL HIGH (ref 43–77)
Neutrophils Relative %: 84 % — ABNORMAL HIGH (ref 43–77)
Neutrophils Relative %: 86 % — ABNORMAL HIGH (ref 43–77)
Neutrophils Relative %: 89 % — ABNORMAL HIGH (ref 43–77)
WBC Morphology: INCREASED

## 2010-04-28 LAB — HEPARIN LEVEL (UNFRACTIONATED)
Heparin Unfractionated: 0.15 IU/mL — ABNORMAL LOW (ref 0.30–0.70)
Heparin Unfractionated: 0.67 IU/mL (ref 0.30–0.70)
Heparin Unfractionated: 0.73 IU/mL — ABNORMAL HIGH (ref 0.30–0.70)
Heparin Unfractionated: 0.76 IU/mL — ABNORMAL HIGH (ref 0.30–0.70)
Heparin Unfractionated: 1.41 IU/mL — ABNORMAL HIGH (ref 0.30–0.70)
Heparin Unfractionated: 1.77 IU/mL — ABNORMAL HIGH (ref 0.30–0.70)
Heparin Unfractionated: <0.1 IU/mL — ABNORMAL LOW (ref 0.30–0.70)
Heparin Unfractionated: <0.1 IU/mL — ABNORMAL LOW (ref 0.30–0.70)

## 2010-04-28 LAB — BRAIN NATRIURETIC PEPTIDE
Pro B Natriuretic peptide (BNP): 1468 pg/mL — ABNORMAL HIGH (ref 0.0–100.0)
Pro B Natriuretic peptide (BNP): 318 pg/mL — ABNORMAL HIGH (ref 0.0–100.0)

## 2010-04-28 LAB — URINALYSIS, ROUTINE W REFLEX MICROSCOPIC
Bilirubin Urine: NEGATIVE
Glucose, UA: NEGATIVE mg/dL
Nitrite: NEGATIVE
Protein, ur: 30 mg/dL — AB
Specific Gravity, Urine: 1.014 (ref 1.005–1.030)
Specific Gravity, Urine: 1.023 (ref 1.005–1.030)
Urobilinogen, UA: 0.2 mg/dL (ref 0.0–1.0)
Urobilinogen, UA: 0.2 mg/dL (ref 0.0–1.0)
pH: 5.5 (ref 5.0–8.0)

## 2010-04-28 LAB — HEPATITIS B SURFACE ANTIGEN: Hepatitis B Surface Ag: NEGATIVE

## 2010-04-28 LAB — CARBOXYHEMOGLOBIN
Carboxyhemoglobin: 1.1 % (ref 0.5–1.5)
Methemoglobin: 2.9 % — ABNORMAL HIGH (ref 0.0–1.5)
O2 Saturation: 50.2 %
O2 Saturation: 63.1 %
Total hemoglobin: 11.3 g/dL — ABNORMAL LOW (ref 13.5–18.0)
Total hemoglobin: 11.7 g/dL — ABNORMAL LOW (ref 13.5–18.0)

## 2010-04-28 LAB — APTT
aPTT: 30 seconds (ref 24–37)
aPTT: 31 seconds (ref 24–37)
aPTT: 40 seconds — ABNORMAL HIGH (ref 24–37)
aPTT: 52 seconds — ABNORMAL HIGH (ref 24–37)
aPTT: >200 seconds (ref 24–37)

## 2010-04-28 LAB — CULTURE, BAL-QUANTITATIVE W GRAM STAIN: Colony Count: 400

## 2010-04-28 LAB — CULTURE, BLOOD (ROUTINE X 2)
Culture: NO GROWTH
Culture: NO GROWTH
Culture: NO GROWTH
Culture: NO GROWTH
Culture: NO GROWTH

## 2010-04-28 LAB — ABO/RH: ABO/RH(D): O POS

## 2010-04-28 LAB — URINE MICROSCOPIC-ADD ON

## 2010-04-28 LAB — ANAEROBIC CULTURE

## 2010-04-28 LAB — LACTIC ACID, PLASMA
Lactic Acid, Venous: 5.9 mmol/L — ABNORMAL HIGH (ref 0.5–2.2)
Lactic Acid, Venous: 6.1 mmol/L — ABNORMAL HIGH (ref 0.5–2.2)

## 2010-04-28 LAB — MRSA PCR SCREENING: MRSA by PCR: NEGATIVE

## 2010-04-28 LAB — ALT: ALT: 180 U/L — ABNORMAL HIGH (ref 0–53)

## 2010-04-28 LAB — WOUND CULTURE

## 2010-04-28 LAB — VANCOMYCIN, RANDOM: Vancomycin Rm: 37.4 ug/mL

## 2010-04-28 LAB — STREP PNEUMONIAE URINARY ANTIGEN: Strep Pneumo Urinary Antigen: NEGATIVE

## 2010-04-28 LAB — D-DIMER, QUANTITATIVE: D-Dimer, Quant: <0.22 ug/mL-FEU (ref 0.00–0.48)

## 2010-04-28 LAB — CALCIUM, IONIZED: Calcium, Ion: 0.84 mmol/L — ABNORMAL LOW (ref 1.12–1.32)

## 2010-04-30 LAB — URINE MICROSCOPIC-ADD ON

## 2010-04-30 LAB — POCT I-STAT, CHEM 8
Calcium, Ion: 1.14 mmol/L (ref 1.12–1.32)
Creatinine, Ser: 1.2 mg/dL (ref 0.4–1.5)
Glucose, Bld: 214 mg/dL — ABNORMAL HIGH (ref 70–99)
HCT: 44 % (ref 39.0–52.0)
Hemoglobin: 15 g/dL (ref 13.0–17.0)
Potassium: 4.4 mEq/L (ref 3.5–5.1)
TCO2: 22 mmol/L (ref 0–100)

## 2010-04-30 LAB — URINALYSIS, ROUTINE W REFLEX MICROSCOPIC
Glucose, UA: NEGATIVE mg/dL
Protein, ur: 100 mg/dL — AB
pH: 5 (ref 5.0–8.0)

## 2010-04-30 LAB — PROTIME-INR: Prothrombin Time: 25.5 seconds — ABNORMAL HIGH (ref 11.6–15.2)

## 2010-05-07 ENCOUNTER — Ambulatory Visit (INDEPENDENT_AMBULATORY_CARE_PROVIDER_SITE_OTHER): Payer: PRIVATE HEALTH INSURANCE | Admitting: Family Medicine

## 2010-05-07 DIAGNOSIS — I2699 Other pulmonary embolism without acute cor pulmonale: Secondary | ICD-10-CM

## 2010-05-07 DIAGNOSIS — Z5181 Encounter for therapeutic drug level monitoring: Secondary | ICD-10-CM

## 2010-05-07 DIAGNOSIS — Z7901 Long term (current) use of anticoagulants: Secondary | ICD-10-CM

## 2010-05-07 DIAGNOSIS — I4891 Unspecified atrial fibrillation: Secondary | ICD-10-CM

## 2010-05-07 LAB — POCT INR: INR: 2.1

## 2010-05-07 NOTE — Patient Instructions (Signed)
New dose

## 2010-05-15 ENCOUNTER — Telehealth: Payer: Self-pay | Admitting: *Deleted

## 2010-05-15 DIAGNOSIS — Z5181 Encounter for therapeutic drug level monitoring: Secondary | ICD-10-CM

## 2010-05-15 DIAGNOSIS — I4891 Unspecified atrial fibrillation: Secondary | ICD-10-CM

## 2010-05-15 DIAGNOSIS — Z7901 Long term (current) use of anticoagulants: Secondary | ICD-10-CM

## 2010-05-15 NOTE — Telephone Encounter (Signed)
INR monitoring enrollment  

## 2010-05-17 LAB — BASIC METABOLIC PANEL
BUN: 23 mg/dL (ref 6–23)
Calcium: 8.7 mg/dL (ref 8.4–10.5)
Creatinine, Ser: 1.14 mg/dL (ref 0.4–1.5)
GFR calc non Af Amer: >60 mL/min (ref >60)
Glucose, Bld: 128 mg/dL — ABNORMAL HIGH (ref 70–99)
Potassium: 3.6 mEq/L (ref 3.5–5.1)

## 2010-05-17 LAB — DIFFERENTIAL
Basophils Absolute: 0 10*3/uL (ref 0.0–0.1)
Eosinophils Absolute: 0.2 10*3/uL (ref 0.0–0.7)
Eosinophils Relative: 2 % (ref 0–5)
Lymphocytes Relative: 18 % (ref 12–46)
Lymphs Abs: 2 10*3/uL (ref 0.7–4.0)
Neutrophils Relative %: 73 % (ref 43–77)

## 2010-05-17 LAB — GLUCOSE, CAPILLARY: Glucose-Capillary: 123 mg/dL — ABNORMAL HIGH (ref 70–99)

## 2010-05-17 LAB — CULTURE, RESPIRATORY W GRAM STAIN

## 2010-05-17 LAB — CBC
HCT: 40.5 % (ref 39.0–52.0)
Platelets: 198 10*3/uL (ref 150–400)
RDW: 14.7 % (ref 11.5–15.5)
WBC: 11.2 10*3/uL — ABNORMAL HIGH (ref 4.0–10.5)

## 2010-05-18 LAB — DIFFERENTIAL
Basophils Absolute: 0 10*3/uL (ref 0.0–0.1)
Basophils Relative: 0 % (ref 0–1)
Monocytes Absolute: 1.6 10*3/uL — ABNORMAL HIGH (ref 0.1–1.0)
Neutro Abs: 10.3 10*3/uL — ABNORMAL HIGH (ref 1.7–7.7)
Neutrophils Relative %: 80 % — ABNORMAL HIGH (ref 43–77)

## 2010-05-18 LAB — BASIC METABOLIC PANEL
CO2: 20 mEq/L (ref 19–32)
Calcium: 9.6 mg/dL (ref 8.4–10.5)
Creatinine, Ser: 2.32 mg/dL — ABNORMAL HIGH (ref 0.4–1.5)
Glucose, Bld: 193 mg/dL — ABNORMAL HIGH (ref 70–99)

## 2010-05-18 LAB — URINALYSIS, ROUTINE W REFLEX MICROSCOPIC
Hgb urine dipstick: NEGATIVE
Nitrite: NEGATIVE
Specific Gravity, Urine: 1.045 — ABNORMAL HIGH (ref 1.005–1.030)
Urobilinogen, UA: 1 mg/dL (ref 0.0–1.0)

## 2010-05-18 LAB — CBC
Hemoglobin: 13.4 g/dL (ref 13.0–17.0)
MCHC: 33.5 g/dL (ref 30.0–36.0)
RDW: 13.9 % (ref 11.5–15.5)

## 2010-05-18 LAB — APTT: aPTT: 58 seconds — ABNORMAL HIGH (ref 24–37)

## 2010-05-18 LAB — URINE CULTURE

## 2010-05-21 ENCOUNTER — Ambulatory Visit: Payer: PRIVATE HEALTH INSURANCE

## 2010-05-27 ENCOUNTER — Ambulatory Visit (INDEPENDENT_AMBULATORY_CARE_PROVIDER_SITE_OTHER): Payer: Medicare Other | Admitting: Family Medicine

## 2010-05-27 ENCOUNTER — Encounter: Payer: Self-pay | Admitting: Family Medicine

## 2010-05-27 DIAGNOSIS — I10 Essential (primary) hypertension: Secondary | ICD-10-CM

## 2010-05-27 DIAGNOSIS — M545 Low back pain, unspecified: Secondary | ICD-10-CM

## 2010-05-27 DIAGNOSIS — F3289 Other specified depressive episodes: Secondary | ICD-10-CM

## 2010-05-27 DIAGNOSIS — Z7901 Long term (current) use of anticoagulants: Secondary | ICD-10-CM

## 2010-05-27 DIAGNOSIS — E119 Type 2 diabetes mellitus without complications: Secondary | ICD-10-CM

## 2010-05-27 DIAGNOSIS — Z5181 Encounter for therapeutic drug level monitoring: Secondary | ICD-10-CM

## 2010-05-27 DIAGNOSIS — F329 Major depressive disorder, single episode, unspecified: Secondary | ICD-10-CM

## 2010-05-27 DIAGNOSIS — I4891 Unspecified atrial fibrillation: Secondary | ICD-10-CM

## 2010-05-27 DIAGNOSIS — E7800 Pure hypercholesterolemia, unspecified: Secondary | ICD-10-CM

## 2010-05-27 LAB — POCT INR: INR: 1.8

## 2010-05-27 NOTE — Patient Instructions (Signed)
When able to get back on track with exercise and healthy eating.

## 2010-05-27 NOTE — Patient Instructions (Signed)
Change dose to 2.5 mg daily, 5 mg MWF

## 2010-05-27 NOTE — Progress Notes (Signed)
  Subjective:    Patient ID: [name], male    DOB: 1940-05-15, 70 y.o.   MRN: [id number]  HPI Diabetes:  Using medications without difficulties: Hypoglycemic episodes:none Hyperglycemic episodes:rare Feet problems:peripheral neuropathy, no cuts Blood Sugars averaging:130s Eye exam within last year: yes.. In last month. Seeing ENDO. Last A1C 1 month ago.. Well controlled Taking levemir and novolog with each meal.  Hypertension:    Using medication without problems or lightheadedness:  Chest pain with exertion: no Edema:yes.. Using diuretic .[name] Not sure which.. Was older med he had been on in past... Will call me with result. Short of breath:no Average home BPs: not checking. Other issues:none  Elevated Cholesterol: Evaluated at ENDO per pt. Will get records. Not on any cholesterol meds.  Continued back problems...ruptured disc... Dr. Yevette Edwards... Performed back surgery.  Has also seen Dr. Modesto Charon .[name] Who has done multiple steroid injections.  Not exercsing due to  Pain. Using vicodin at bedtime.  Cataract surgery in last few months.  PMH and SH reviewed.      Review of Systems  Constitutional: Negative for fever and fatigue.  Cardiovascular: Negative for chest pain.  Gastrointestinal: Negative for abdominal pain, diarrhea, constipation and blood in stool.  Psychiatric/Behavioral: Positive for dysphoric mood. Negative for behavioral problems and decreased concentration.       Objective:   Physical Exam Vital signs, Meds and allergies reviewed.   GEN: nad, alert and oriented, OBESE HEENT: mucous membranes moist, PERRLA, EOMI, Nares clear, B external ears clear, TMs without pus or bulging NECK: supple w/o LA CV: rrr.  no murmur, no rub, no gallop PULM: ctab, no inc wob ABD: soft, +bs EXT: no edema currently, B varicosities SKIN: no acute rash PSYCH: alert, appropriate mood and affect  Diabetic foot exam: Normal inspection No skin breakdown No calluses    Normal DP pulses Normal sensation to light tough and monofilament Nails normal        Assessment & Plan:

## 2010-05-28 NOTE — Assessment & Plan Note (Addendum)
Well controlled. Continue current medication. He is using unknown diuretic to control peripheral edema... Instructed pt to call with what medication type is to assure it is okay to continue.

## 2010-05-28 NOTE — Assessment & Plan Note (Signed)
Stable per cards. On coumadin.. Followed at out clinic. INR at goal last eval.

## 2010-05-28 NOTE — Assessment & Plan Note (Signed)
Poor control. Per Back specialist.

## 2010-05-28 NOTE — Assessment & Plan Note (Signed)
Well controlled. Continue current medication.  

## 2010-05-28 NOTE — Assessment & Plan Note (Signed)
Stable per ENDO. Obtain records for review. Encouraged exercise, weight loss, healthy eating habits.

## 2010-05-28 NOTE — Assessment & Plan Note (Signed)
Stable per ENDO. Obtain records for review. Encouraged exercise, weight loss, healthy eating habits.  

## 2010-05-30 ENCOUNTER — Other Ambulatory Visit (INDEPENDENT_AMBULATORY_CARE_PROVIDER_SITE_OTHER): Payer: Medicare Other | Admitting: Family Medicine

## 2010-05-30 DIAGNOSIS — E785 Hyperlipidemia, unspecified: Secondary | ICD-10-CM

## 2010-05-30 DIAGNOSIS — K5732 Diverticulitis of large intestine without perforation or abscess without bleeding: Secondary | ICD-10-CM

## 2010-05-30 DIAGNOSIS — J309 Allergic rhinitis, unspecified: Secondary | ICD-10-CM

## 2010-05-30 DIAGNOSIS — E119 Type 2 diabetes mellitus without complications: Secondary | ICD-10-CM

## 2010-05-30 DIAGNOSIS — I10 Essential (primary) hypertension: Secondary | ICD-10-CM

## 2010-05-30 DIAGNOSIS — N289 Disorder of kidney and ureter, unspecified: Secondary | ICD-10-CM

## 2010-05-30 LAB — RENAL FUNCTION PANEL
CO2: 25 mEq/L (ref 19–32)
Chloride: 102 mEq/L (ref 96–112)
Creatinine, Ser: 1.5 mg/dL (ref 0.4–1.5)
GFR: 49.38 mL/min — ABNORMAL LOW (ref >60.00)
Phosphorus: 3.2 mg/dL (ref 2.3–4.6)
Sodium: 138 mEq/L (ref 135–145)

## 2010-05-30 LAB — HEPATIC FUNCTION PANEL
ALT: 21 U/L (ref 0–53)
AST: 14 U/L (ref 0–37)
Bilirubin, Direct: 0.1 mg/dL (ref 0.0–0.3)
Total Bilirubin: 0.8 mg/dL (ref 0.3–1.2)

## 2010-05-30 LAB — LIPID PANEL
Total CHOL/HDL Ratio: 7
VLDL: 60.8 mg/dL — ABNORMAL HIGH (ref 0.0–40.0)

## 2010-05-30 LAB — CBC WITH DIFFERENTIAL/PLATELET
Basophils Absolute: 0 10*3/uL (ref 0.0–0.1)
Eosinophils Relative: 1.4 % (ref 0.0–5.0)
HCT: 43.6 % (ref 39.0–52.0)
Lymphocytes Relative: 29.6 % (ref 12.0–46.0)
Monocytes Relative: 9.5 % (ref 3.0–12.0)
Neutrophils Relative %: 58.9 % (ref 43.0–77.0)
Platelets: 219 10*3/uL (ref 150.0–400.0)
WBC: 7.5 10*3/uL (ref 4.5–10.5)

## 2010-05-30 LAB — MICROALBUMIN / CREATININE URINE RATIO
Creatinine,U: 57.4 mg/dL
Microalb Creat Ratio: 5.6 mg/g (ref 0.0–30.0)

## 2010-05-30 LAB — HEMOGLOBIN A1C: Hgb A1c MFr Bld: 7.7 % — ABNORMAL HIGH (ref 4.6–6.5)

## 2010-06-06 ENCOUNTER — Telehealth: Payer: Self-pay | Admitting: *Deleted

## 2010-06-06 ENCOUNTER — Encounter: Payer: Self-pay | Admitting: *Deleted

## 2010-06-06 ENCOUNTER — Encounter: Payer: Medicare Other | Admitting: Family Medicine

## 2010-06-06 ENCOUNTER — Ambulatory Visit: Payer: Medicare Other

## 2010-06-06 MED ORDER — FENOFIBRATE 160 MG PO TABS: 160.0000 mg | ORAL_TABLET | Freq: Every day | ORAL | Status: DC

## 2010-06-06 MED ORDER — ATORVASTATIN CALCIUM 80 MG PO TABS: 80.0000 mg | ORAL_TABLET | Freq: Every day | ORAL | Status: DC

## 2010-06-06 MED ORDER — FUROSEMIDE 20 MG PO TABS: ORAL_TABLET | ORAL | Status: DC

## 2010-06-06 NOTE — Telephone Encounter (Signed)
Patient advised.

## 2010-06-06 NOTE — Telephone Encounter (Signed)
[name] was on cholesterol medication before his hospital stay last year. Patient says he was on Simvastatin 80 mg daily for cholesterol and fenofibrate 160 mg daily also. Patient also says that he was on glipizide 10mg  XL for diabetes and fursomide 20mg .  [facility]

## 2010-06-06 NOTE — Telephone Encounter (Signed)
Agree with restarting cholesterol meds as liver issue now resolved.  Will change to lipitor 80 mg daily instead of simvastatin due to less risk of SE with lipitor and now generic. Will refill lasix for him to use as needed. Glipizide use per his ENDO.  Make sure he has chol and liver function test at [facility] or here in 3 months.

## 2010-06-09 ENCOUNTER — Telehealth: Payer: Self-pay | Admitting: *Deleted

## 2010-06-09 MED ORDER — ROSUVASTATIN CALCIUM 20 MG PO TABS: 20.0000 mg | ORAL_TABLET | Freq: Every day | ORAL | Status: DC

## 2010-06-09 NOTE — Telephone Encounter (Signed)
Let pt know crestor sent in instead.

## 2010-06-09 NOTE — Telephone Encounter (Signed)
Patient's insurance is not going to cover the lipitor and is asking if he can try something else. [facility]

## 2010-06-09 NOTE — Telephone Encounter (Signed)
Patient advised.

## 2010-06-16 ENCOUNTER — Ambulatory Visit: Payer: Medicare Other

## 2010-06-18 ENCOUNTER — Ambulatory Visit (INDEPENDENT_AMBULATORY_CARE_PROVIDER_SITE_OTHER): Payer: Medicare Other | Admitting: Family Medicine

## 2010-06-18 DIAGNOSIS — Z5181 Encounter for therapeutic drug level monitoring: Secondary | ICD-10-CM

## 2010-06-18 DIAGNOSIS — I4891 Unspecified atrial fibrillation: Secondary | ICD-10-CM

## 2010-06-18 DIAGNOSIS — Z7901 Long term (current) use of anticoagulants: Secondary | ICD-10-CM

## 2010-06-18 LAB — POCT INR: INR: 4.2

## 2010-06-23 ENCOUNTER — Ambulatory Visit: Payer: Medicare Other | Admitting: Family Medicine

## 2010-06-23 ENCOUNTER — Encounter: Payer: Self-pay | Admitting: Family Medicine

## 2010-06-24 NOTE — Assessment & Plan Note (Signed)
[facility]                            CARDIOLOGY OFFICE NOTE   QUANELL, LOUGHNEY                    MRN:          [id number]  DATE:09/08/2007                            DOB:          [birth year]    PRIMARY CARE PHYSICIAN:  [name], MD.   REASON FOR PRESENTATION:  Evaluate patient with atrial fibrillation.   HISTORY OF PRESENT ILLNESS:  The patient presents for yearly followup.  He is now 70 years old.  Since I saw him, he has had no new  cardiovascular complaints.  He recently had arthroscopic surgery on his  right knee.  He came off the Coumadin for this.  He says he is not  noticing any  palpitations.  He has had no presyncope or syncope.  He is  tolerating his Coumadin otherwise.  He has had no chest pain or  shortness of breath.  He is starting to go to the gym recently and has  been able to do some stationary bicycle.  With this, he has no  significant limitations.  He denies any chest pressure, neck or arm  discomfort.   PAST MEDICAL HISTORY:  1. Permanent atrial fibrillation.  2. Status asthmaticus.  3. History of pulmonary embolism.  4. Hypertension.  5. Headaches.  6. Gastroesophageal reflux disease.  7. Morbid obesity.  8. Anxiety/depression.  9. Hyperlipidemia.  10.Repair of the left forearm wound.  11.Knee surgery.  12.Bilateral heel spur surgery.  13.Back surgery.   ALLERGIES:  None.   MEDICATIONS:  1. Metformin 1000 mg b.i.d.  2. Actos 30 mg daily.  3. Nexium 40 mg b.i.d.  4. Coumadin.  5. Verelan 180 mg daily.  6. Fluoxetine 40 mg daily.  7. Clorazepate 3.75 mg b.i.d.  8. Vitamin C.  9. Vitamin E.  10.Atenolol 50 mg daily.  11.Grape seed.  12.Warfarin.  13.Fenofibrate 160 mg daily.  14.Lasix 20 mg p.r.n.  15.Simvastatin 40 mg daily.   REVIEW OF SYSTEMS:  As stated in the HPI and otherwise negative for  other systems.   PHYSICAL EXAMINATION:  GENERAL:  The patient is in no distress.  VITAL SIGNS:   Blood pressure 138/90, heart rate 73 and regular, weight  204 pounds, body mass index 38.  HEENT:  Eyes unremarkable.  Pupils equal, round, and reactive to light.  Fundi not visualized.  Oral mucosa unremarkable.  NECK:  No jugular venous distention at 45 degrees.  Carotid upstroke  brisk and symmetric.  No bruits, no thyromegaly.  LYMPHATICS:  No adenopathy.  LUNGS:  Clear to auscultation bilaterally.  BACK:  No costovertebral angle tenderness.  CHEST:  Unremarkable.  HEART:  PMI not displaced or sustained.  S1 and S2 within normals.  No  S3, no murmurs.  ABDOMEN:  Morbidly obese; positive bowel sounds, normal in frequency and  pitch; no bruits, no rebound, no guarding, no midline pulsatile mass, no  organomegaly.  SKIN:  No rashes.  EXTREMITIES:  2+ pulse. No edema.   EKG; atrial fibrillation, rate 73, axis within normal limits, intervals  within normal limits, no acute ST-T wave changes.   ASSESSMENT AND  PLAN:  1. Atrial fibrillation.  The patient is in permanent atrial      fibrillation.  He has had no symptoms related to this.  He is      tolerating his Coumadin.  He has had reasonable rate control.  No      further cardiovascular testing is suggested.  2. Obesity.  He understands that he needs to lose weight with diet and      exercise.  Hopefully, now that he has joined [employer], he will be      able to accomplish some of this though he has never been able to do      this in the past.  3. Dyslipidemia, he is on combinational therapy.  This is followed by      Dr. Ermalene Searing.  4. Followup.  I will see the patient back in 1 year or sooner if      needed.     Rollene Rotunda, MD, [facility]  Electronically Signed    JH/MedQ  DD: 09/08/2007  DT: 09/09/2007  Job #: [id number]   cc:   [name], MD

## 2010-06-24 NOTE — Assessment & Plan Note (Signed)
[facility]                            CARDIOLOGY OFFICE NOTE   EGE, MUCKEY                    MRN:          [id number]  DATE:08/26/2006                            DOB:          [birth year]    REFERRING PHYSICIAN:  [name], MD   PRIMARY CARE PHYSICIAN:  Barbette Hair. Artist Pais, DO.   REASON FOR PRESENTATION:  Evaluate patient with atrial fibrillation.   HISTORY OF PRESENT ILLNESS:  The patient is 70 years old.  He presents  for followup of his atrial fibrillation.  Since I last saw him, he has  had no new cardiovascular problems.  He has been bothered with herpetic  zoster on his face.  He has not noticed any palpitations, presyncope, or  syncope.  He has had no new shortness of breath.  He denies any PND or  orthopnea.  He denies any chest pain.  Unfortunately, he has not been  able to loose weight because he reports that he eats too much.   PAST MEDICAL HISTORY:  1. Persistent atrial fibrillation.  2. Status asthmaticus.  3. History of pulmonary embolism.  4. Hypertension.  5. Headaches.  6. Gastroesophageal reflux disease.  7. Morbid obesity.  8. Anxiety/depression.  9. Hyperlipidemia.  10.Repair of a left forearm wound.  11.Knee surgery.  12.Bilateral heel spur surgery.  13.Back surgery.   ALLERGIES:  None.   CURRENT MEDICATIONS:  1. Metoprolol 1,000 mg b.i.d.  2. Actos 15 mg daily.  3. Crestor 20 mg q.h.s.  4. Nexium 40 mg b.i.d.  5. [facility]mad[facility].  6. Verelan 180 mg daily.  7. Hydrochlorothiazide 25 mg daily.  8. Fluoxetine 40 mg daily.  9. Clorazepate.  10.Vitamin C.  11.Vitamin E.  12.Asthmanex.  13.Atenolol 50 mg daily.  14.Grape seed.   REVIEW OF SYSTEMS:  As stated in the HPI and otherwise negative for  other systems.   PHYSICAL EXAMINATION:  GENERAL:  The patient is in no distress.  He is  pleasant.  VITAL SIGNS:  Blood pressure 118/78, heart rate 75 and irregular, weight  309 pounds.  HEENT:  Eyelids unremarkable.  Pupils are equal, round, and reactive to  light.  Fundi not visualized.  Oral mucosa unremarkable.  NECK:  No jugular venous distention at 45 degrees.  Carotid upstroke  brisk and symmetric.  No bruits.  No thyromegaly.  LYMPHATICS:  No adenopathy.  LUNGS:  Clear to auscultation bilaterally.  BACK:  No costovertebral angle tenderness.  CHEST:  Unremarkable.  HEART:  PMI not displaced or sustained.  S1 and S2 within normal limits.  No S3, no murmurs.  ABDOMEN:  Morbidly obese.  Positive bowel sounds, normal in frequency  and pitch.  No bruits.  No rebound.  No guarding.  No midline pulsatile  mass.  No hepatosplenomegaly, no splenomegaly.  SKIN:  No rashes.  No nodules.  EXTREMITIES:  Two plus pulses throughout.  No edema, no cyanosis, no  clubbing.  NEUROLOGIC:  Oriented to person, place and time.  Cranial nerves II-XII  grossly intact.  Motor grossly intact.   EKG:  Atrial fibrillation,  axis within normal limits, intervals within  normal limits, no acute ST-T wave changes.   ASSESSMENT/PLAN:  1. Atrial fibrillation. The patient is having no symptoms related to      this.  He has had reasonable rate control.  He tolerates Coumadin.      No further cardiovascular testing is suggested.  2. Obesity.  We have had a long discussions about the need to manage      this but he has failed.  He has even been followed in bariatrics.      I continue to encourage calorie counting and exercise.   FOLLOWUP:  Will be in one year or sooner if needed.   Of note, I did switch him from Crestor to Simvastatin from 40 mg as he  is going to Medicare and will need generics.     Rollene Rotunda, MD, [facility]  Electronically Signed    JH/MedQ  DD: 08/26/2006  DT: 08/27/2006  Job #: [id number]

## 2010-06-24 NOTE — Assessment & Plan Note (Signed)
[facility]                         GASTROENTEROLOGY OFFICE NOTE   [name], [name]                    MRN:          [id number]  DATE:01/28/2007                            DOB:          [birth year]    [name] continues with rather classic gastroparesis symptoms with early  satiety, abdominal discomfort, gas and bloating.  He has had documented  gastroparesis with abnormal gastric emptying scans, but has failed to  have response in the past to Reglan therapy.  Because of his atrial  fibrillation and multiple cardiovascular issues he has not been felt to  be a candidate for domperidone which causes prolonged QT syndrome, also  there are too many interactions with erythromycin to justify this  medication.  I have decided to try him on Dramamine 50 mg a day per [place] Journal of Medicine article in February of 2007.  However, I  think he may need gastric pacing and I have referred him to Dr. Alycia Rossetti at  [facility] for further evaluation and possible treatment.   He is diabetic, is on at least 15 medications including warfarin and  multiple cardiac medications.  I have instructed him in our frequent  small feeding gastroparesis diet and will continue all of his other  medications including Nexium 40 mg twice a day until we can get further  evaluation of his refractory gastroparesis at [facility].   EXAMINATION:  Today shows his blood pressure to be 118/80 and pulse was  68 and regular.  He weighs 304 pounds.  He has a massively obese abdomen  with some mild hepatomegaly but I can not appreciate other abdominal  masses or tenderness.  He had positive bowel sounds but I could not  appreciate a definite succussion splash.     Vania Rea. Jarold Motto, MD, [facility], FAGA  Electronically Signed    DRP/MedQ  DD: 01/28/2007  DT: 01/29/2007  Job #: [id number]   cc:   Barbette Hair. Artist Pais, DO

## 2010-06-27 NOTE — Group Therapy Note (Signed)
Consult requested by Dr. Maris Berger.   Consult requested for treatment of the shingles left side of upper face.   HISTORY:  [age]-year-old male, past medical history significant for  diabetes mellitus as well as hypertension, who developed shingles left  side upper face November 2007.  His lesions were on the forehead mainly,  went to his doctor, received what sounds like Valtrex, and his lesions  healed up.  However, he has continued to have pain since that time with  hypersensitivity to touch around the eye, particularly over the  zygomatic area.  He initially had ear pain but this has subsided.  He  has had no double vision, no swallowing problems, no upper or lower  extremity numbness, tingling or weakness.  His pain is graded as 8/10,  described as sharp intermittent, constant tingling and interferes with  general activity at any moderate level.  His pain is worse generally  during the daytime and into the evening, best in the morning.  His pain  is worse with activity, most particularly with laying on a pillow on  that side and improves with warmth.  He has tried Neurontin in the past.  He was up to 3 pills a day, he thinks they were 100 mg but does not  really remember the dosage.  He has tried Vicodin and Percocet which  have not been particularly helpful in his case.   PAST MEDICAL HISTORY:  Significant for back surgery in 2001 and 2002,  total knee arthroplasty in 1998 and 2003.   SOCIAL HISTORY:  He is married and lives with his wife, nonsmoker.   FAMILY HISTORY:  Heart disease, high blood pressure.   PHYSICAL EXAMINATION:  His blood pressure was 148/75, pulse 76,  respirations 16, O2 sat 94% on room air.  GENERAL:  In no acute distress, mood and affect appropriate.  His gait  is normal.  His upper and lower extremity strength and range of motion are normal.  His neck range of motion is normal.  Cranial nerves II-XII are intact with the exception that V-I and  V-II  have hyperalgesia to touch.  No skin lesions are noted.   IMPRESSION:  Postherpetic neuralgia V-I, V-II distribution.   RECOMMENDATION:  We discussed treatment options with him and we will  initiate another round of conservative care which will consist of  Lidoderm cut to around the eye to be worn at night only as well as  Lyrica samples starting with 75 b.i.d. working up to 150 b.i.d. after a  week.  In addition we will give him Ultram 50 mg q.i.d., went over the  potential side effects of this medication.  We discussed trigeminal  nerve block on the left and described the risks and benefits such as  bleeding, stroke, intracranial infection.  We will set him up with Dr.  Stevphen Rochester for the injection should the above therapy be ineffective.  He  will need to come off his Coumadin for at least 5 days prior to and  check a pro-time.  He states that he has done this multiple occasions  with other types of diagnostic testing in the past, and states he will  not need to contact his primary to ask about this.   Thank you very much for this interesting consultation.      Erick Colace, M.D.  Electronically Signed     AEK/MedQ  D:  04/23/2006 16:33:14  T:  04/25/2006 04:19:02  Job #:  [id number]  cc:   Michel Harrow, MD  Fax: [phone]   Carola J. Gerri Spore, M.D.  Fax: [phone]

## 2010-06-27 NOTE — H&P (Signed)
[facility]  Patient:    [name], [name]                   MRN: [id number] Adm. Date:  06/10/98 Attending:  Georges Lynch. Darrelyn Hillock, M.D. Dictator:   Druscilla Brownie. Shela Nevin, P.A. CC:         DavidB. Georgina Pillion, M.D., [facility], [place], [place]   History and Physical  DATE OF BIRTH: 11-29-1940  CHIEF COMPLAINT: "Pain in my left knee."  HISTORY OF PRESENT ILLNESS: This patient is a 70 year old male, seen by [place] for continuing problems concerning his left knee.  He has had multiple injections into the knee with cortisone and hasalso had to have analgesics.  He had arthroscopy to the knee in the pastwhich really has only given him short-term relief.  X-rays have shown bone-on-bone deformity in this knee.  Due to these positive findings it is felt this patient would benefit with surgical intervention and is being admitted for left total knee replacement arthroplasty.  The patient has had trauma to this knee in several activities he has had in the past, most specifically work-related.  He works at [employer] and has difficulty getting out of his truck.  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  It is hoped this surgical intervention will eventually return him to his work without the discomfort he has in the left knee.  PAST MEDICAL HISTORY:  1. Being treated for asthma.  2. Atrial fibrillation.  3. Hypertension.  4. He has a distant history of renal calculi about five years ago.  5. Type 2 diabetes. 6. GERD.  PAST SURGICAL HISTORY:  1. Arthroscopy of both knees in 1994.  2. Hand surgery secondary to trauma in 2001.  3. Left knee arthroscopy again in 2001.  4. Excision of right and left heel spurs in 1997.  ALLERGIES: SULFA.  CURRENT MEDICATIONS:  1. Verelan 240 mg one q.d.  2. Fluoxetine 20 mg one q.d.  3. Lipitor 10 mg one q.d.  4. Atenolol 50 mg one q.d.  5. Glucotrol XL 5 mg one  q.d. 6. Clorazepate 3.75 mg two q.d.  7. Prevacid 20 mg p.r.n.  8. Vitamin C.  9. Vitamin E. 10. Coumadin 2.5 mg one q.d.  - will stop this five days prior to surgery.  SOCIAL HISTORY: The patient is married.  Heneither smokes nor drinks.  FAMILY HISTORY: Positive for cancer in the mother, heart disease in his father and a brother.  REVIEW OF SYSTEMS: CNS: No seizure disorder, paralysis, numbness, or double vision. RESPIRATORY: No productive cough, no hemoptysis, no shortness of breath.  He does quite well as long as he is taking his medications for his asthma.  CARDIOVASCULAR: No chest pain, no angina, no orthopnea. GASTROINTESTINAL: No nausea, vomiting, melena, or bloody stools. GENITOURINARY: Nodischarge, dysuria, or hematuria.  MUSCULOSKELETAL: Primarily as in Present Illness with the left knee.  PHYSICAL EXAMINATION:  GENERAL: Alert, cooperative, friendly, obese 70 year old white male.  VITAL SIGNS: Blood pressure 110/74, respirations 12, pulse 64 and irregular.  HEENT: Normocephalic.  Oropharynx clear.  Has dentures.  NECK: Supple.  No lymphadenopathy.  CHEST: Clear to auscultation.  No rhonchi, no rales, no wheezes.  HEART: Irregular rate and rhythm.  No murmurs heard.  ABDOMEN: Soft, nontender, obese.  Liver and spleen not felt.  GU/RECTAL: Examinations not done, not pertinent to present illness.  EXTREMITIES: The patient has tenderness along  the lateral femoral condyle, crepitus with range of motion which is also painful.  ADMISSION DIAGNOSES:  1. Severe osteoarthritis of left knee, severe degenerative arthritis of     left knee secondary to old trauma.  2. Atrial fibrillation.  3. Asthma.  4. Hypertension.  5. Reflux.  6. Type 2 diabetes.  PLAN: The patient will undergo left total knee replacement arthroplasty, plan for being at home with physical therapy after his regular hospitalization.DD: 06/02/00 TD:  06/03/00 Job: [id number]

## 2010-06-27 NOTE — Op Note (Signed)
[facility]  Patient:    NORMA, IGNASIAK                    MRN: [id number] Adm. Date:  [id number] Attending:  Otilio Connors Iv CC:         Georges Lynch. Darrelyn Hillock, M.D.                           Operative Report  BRIEF HISTORY:  Daemian was admitted through the emergency room with a history hat he works for [employer], and he fell.  At that time, he injured his left knee and injured his left thumb.  Basically, he had an opened dislocation of his IP joint of his  left thumb.  PREOPERATIVE DIAGNOSIS:  Open dislocation of the interphalangeal joint of the left thumb.  POSTOPERATIVE DIAGNOSIS:  Open dislocation of the interphalangeal joint of the eft thumb.  OPERATION:  Open reduction of an open dislocation of the left thumb.  DESCRIPTION OF PROCEDURE:  Under general anesthesia and tourniquet control, I first extended the transverse incision that he had over the flexor aspect of his thumb with great care taken not to injure the underlying neurovascular structures or tendons.  His proximal phalanxs articular surface was literally exposed out through the skin.  I thoroughly irrigated out the area.  I identified the flexor tendons.  The flexor tendons were trapped laterally.  I elevated those, irrigated the joint out, reduced the joint, and once the flexor tendons were placed back n a normal position, his thumb was stable and reduced.  I re-irrigated the area out and loosely approximately the skin edges that we opened.  I then splinted his thumb in flexion.  He had 1 g of IV Ancef preoperatively.  Two x-rays of his left knee will be taken in the recovery room.  He was placed in a knee immobilizer. DD:  04/10/99 TD:  04/10/99 Job: [id number]

## 2010-06-27 NOTE — Assessment & Plan Note (Signed)
[facility]                           [place] OFFICE NOTE   [name], [name]                    MRN:          [id number]  DATE:03/17/2006                            DOB:          [birth year]    CHIEF COMPLAINT:  A 70 year old white male here to establish new doctor.   HISTORY OF PRESENT ILLNESS:  [name] states that his previous  doctor, Dr. Dorothe Pea moved to a practice that is too far away for him.  He comes to the clinic today with the following concerns.  1. Diabetes, well controlled:  His fasting blood sugars run between 98      to 130.  Two hour postprandials are in the 140s.  He is on      metformin and Actos without any side effects.  He is unsure as to      what his last hemoglobin A1c is.  2. Hypercholesterolemia, well controlled:  He states that this was      last checked not all that long ago, but is unsure what the results      were, but feels that he was told it was well controlled.  He      currently is on Crestor 20 mg at bedtime without symptoms of      myalgia.  3. Hypertension, well controlled:  He is on atenolol,      hydrochlorothiazide, and Verelan without side effects.  He denies      chest pain or palpitations.   REVIEW OF SYSTEMS:  Otherwise, negative.   PAST MEDICAL HISTORY:  1. Diabetes diagnosed in 1994.  2. Hypercholesterolemia.  3. Atrial fibrillation.  4. Gastroesophageal reflux disease and esophagitis.  5. Asthma, mild intermittent.  6. Anxiety.  7. Depression.  8. Chronic low back pain.  9. Hypertension.  10.Gastroparesis.  11.Irritable bowel syndrome.  12.A fib well controlled on medications.  Has Coumadin checked at [facility].   HOSPITALIZATIONS, SURGERIES, PROCEDURES:  1. EGD.  2. In 2000, cardiac catheterization.  3. Back surgery.  4. Foot surgery x5.  5. Knee surgery bilaterally 6 times, including left knee replacement.  6. In 1988, left forearm injury.  7.  Colonoscopy normal in 2007.   ALLERGIES:  SULFA causing throat swelling.   MEDICATIONS:  1. Metformin 1000 mg 1 tablet p.o. b.i.d.  2. Actos 15 mg 1 tablet p.o. q. a.m.  3. Crestor 20 mg 1 tablet p.o. nightly.  4. Atenolol 50 mg 1 p.o. q. a.m.  5. Nexium 40 mg 1 tablet p.o. b.i.d.  6. Coumadin 2.5 mg as directed.  7. Verelan 180 mg 1 tablet p.o. daily.  8. Hydrochlorothiazide 25 mg 1 tablet daily.  9. Fluoxetine 40 mg daily.  10.Clorazepate 3.75 mg 1 tablet p.o. b.i.d.  11.Vitamin C 500 mg daily.  12.Vitamin E 1 tablet daily.  13.Asmanex inhaled 1 puff daily.  14.Albuterol neb p.r.n.   SOCIAL HISTORY:  No tobacco use.  No alcohol.  No drug use.  He is a  retired Naval architect.  He has been married for 25 years.  He has 1  daughter who is healthy.  He walks on the treadmill about 30 minutes 1  to 2 times per week.  He eats 3 meals per day.  He tries to avoid fast  food and gets lots of fruits and vegetables.  He gets some water, but  mainly [place].   FAMILY HISTORY:  Father deceased at age 53 with MI and emphysema.  Mother deceased at age 12 with pneumonia and possible uterine cancer  that was metastatic.  No MI in the family before age 66.  He has 4  brothers, 1 of whom had an MI at age 47 and 1 who passed away after  being hit by lightening.  He has 1 sister with Alzheimer's and history  of pneumonia.  He also has a brother who developed prostate cancer at an  older age.  There is no family history of any other type of cancer or  diabetes.   PHYSICAL EXAM:  VITAL SIGNS:  Height 73.25 inches, weight 311, making  BMI 35.  Blood pressure 126/80, pulse 80, temperature 98.  GENERAL:  Obese-appearing male in no apparent distress.  HEENT:  PERRLA.  Extraocular muscles are intact.  Oropharynx clear.  Tympanic membranes clear.  Nares clear.  No thyromegaly.  No lymphadenopathy supraclavicular or cervical.  CARDIOVASCULAR:  Regular rate and rhythm.  No murmur, rub, or gallop.   Normal PMI.  2+ peripheral pulses.  No peripheral edema.  PULMONARY:  Clear to auscultation bilaterally.  No wheeze, rales, or  rhonchi.  ABDOMEN:  Soft and nontender.  Normoactive bowel sounds.  No  hepatosplenomegaly.  MUSCULOSKELETAL:  Strength 5/5 in the upper and lower extremities.  NEURO:  Alert and oriented x3.  Cranial nerves 2-12 are intact.   ASSESSMENT AND PLAN:  1. Diabetes mellitus, well controlled:  I will review old records to      determine when he is due for hemoglobin A1c next.  He will return      at least in 3 months to recheck his diabetes.  He will continue      metformin and Actos at this point in time.  2. Hypercholesterolemia, well controlled:  We will continue him on      Crestor.  I will review records to determine when this was last      checked.  3. Hypertension, well controlled:  He will continue atenolol,      hydrochlorothiazide, and Verelan.  4. Atrial fibrillation, rate controlled:  Continue on current      medications.  Coumadin can continue to be adjusted at the [facility].  If he would prefer he may change over to [place], but we will      need to let the lab know.  5. Prevention:  At this point in time, he appears up to date with      prevention, but I will review records to determine when PSA was      last done, as well as other types of prevention.     [name], MD  Electronically Signed    AB/MedQ  DD: 03/24/2006  DT: 03/24/2006  Job #: [id number]

## 2010-06-27 NOTE — Discharge Summary (Signed)
[facility]  Patient:    [name], [name]                    MRN: [id number] Adm. Date:  [id number] Disc. Date: [id number] Attending:  Skip Mayer Dictator:   Ralene Bathe, P.A.                           Discharge Summary  ADMITTING DIAGNOSES: 1. End-stage osteoarthritis, left knee. 2. Asthma. 3. Atrial fibrillation. 4. Hypertension. 5. History of renal calculi. 6. Type 2 diabetes. 7. Gastroesophageal reflux disease.  DISCHARGE DIAGNOSES: 1. End-stage osteoarthritis, left knee. 2. Asthma. 3. Atrial fibrillation. 4. Hypertension. 5. History of renal calculi. 6. Type 2 diabetes. 7. Gastroesophageal reflux disease. 8. Status post left total knee arthroplasty.  OPERATIONS:  Left total knee arthroplasty.  Surgeon:  Ranee Gosselin, M.D. Assistant:  Jene Every, M.D., under spinal anesthesia.  HISTORY OF PRESENT ILLNESS:  [name] is a 70 year old male who has had chronic ongoing problems of his left knee.  Patient has a history of trauma to his knee which was work related at [employer].  He has had multiple injections of glucosteroids as well as analgesics and is having significant pain.  X-rays have shown bone-on-bone deformity.  At this time, he has actually had a left knee arthroscopy without relief.  At this time, total knee arthroplasty is indicated.  Risks and benefits were discussed with patient and he is in agreement to proceed.  HOSPITAL COURSE:  Patient was admitted, underwent the above-named procedure, and tolerated this well.  All appropriate IV antibiotics and analgesics were provided.  Postoperatively, patient was placed on DVT and PE prophylaxis with Coumadin.  However, he was on chronic Coumadin due to atrial fibrillation. His medical physician would follow this on an outpatient basis.  He is also weightbearing as tolerated on the operative extremity.  Patient did extremely well, remained hemodynamically stable  without complications.  His knee incision was dry, no effusion, minimal swelling.  He started working with physical therapy and progressed extremely well.  By Jun 13, 2000, he had met all discharge goals.  He was ambulating over 200 feet.  He was tolerating oral analgesics and was voiding and had a bowel movement without difficulty.  At this time, he was medically, orthopedically stable for discharge to home for follow-up on an outpatient basis.  LABORATORY DATA:  Admission hemoglobin 15.9, postoperatively 13.7, 12.8, and 12.2.  Pro times, INR had normalized prior to surgery.  However, on his preadmission visit on April 24, he was therapeutic at 2.7 INR. Postoperatively, he resumed his Coumadin and was 3.4 prior to discharge.  He has received double his normal dose just to get him to therapeutic range prior to that.  He is being continued on his normal daily dose from his medical physician.  Chemistries within normal limits except for glucose 179. Urinalysis on admission within normal limits.  On Jun 10, 2000, trace hemoglobin and small leukocyte esterase, only 0 to 5 WBC, though.  Blood type showed O positive.  X-rays showed severe narrowing in the medial joint space on the left knee. Postoperative films showed satisfactory alignment of left total knee.  CONDITION ON DISCHARGE:  Stable, improved.  DISCHARGE MEDICATIONS AND PLANS:  Patient being discharged to home in care of his family.  Home health PT or RN is arranged.  Follow up in our office two weeks postoperatively, to call for time.  May shower at this time postoperatively.  He is given a prescription for Percocet 5 mg, #40, one to two every 4 to 6 p.r.n. pain, Robaxin 500 mg, #30, one every 8 p.r.n. spasm, and Ambien 10 mg one q.h.s., #20, with refill.  Call the office for any further problems. Resume home diet, home medications. DD:  06/16/00 TD:  06/17/00 Job: [id number]

## 2010-06-27 NOTE — Assessment & Plan Note (Signed)
[facility]                           GASTROENTEROLOGY OFFICE NOTE   VALERIA, KRISKO                    MRN:          [id number]  DATE:10/27/2005                            DOB:          03/28/1940    [name] is a 70 year old white male diabetic with chronic atrial  fibrillation and chronic coumadinization.  He has had previous cardiac  stenting and is followed closely by our colleague cardiologist.   I have seen [name] for many years because of irritable bowel syndrome-type  complaints with an element of delayed gastric emptying confirmed by previous  gastric emptying scans.  He also has a chronic left upper quadrant pain  which has been felt to very likely be associated with his diabetes indeed,  perhaps an abdominal wall neuropathy.  He has had significant GI work-ups  several years ago including colonoscopy which was unremarkable except for  diverticulosis, negative endoscopy with small bowel biopsies.  He does have  known fatty infiltration of his liver and splenomegaly, but has had no  evidence of portal hypertension otherwise.  Actually his liver function  tests have been repeatedly normal.  Last gallbladder ultrasound in February  2004 showed no evidence of cholelithiasis.  He has had several hydrogen  breath tests performed with glucose that have been normal.  He has been on  PPI therapy for a long period of time and is currently on Nexium 40 mg twice  a day because of what he describes as severe acid reflux for which he saw  another physician within the last few years.  He also has associated  asthmatic bronchitis and recurrent coughing felt secondary to acid reflux.  Surprisingly on his endoscopy, he did not have a very large hiatal hernia  noted.   In the past I have tried him on empiric pancreatic extract therapy, Reglan  therapy, and IBS management, none of which have seemed to help his constant  mid abdominal pain which he  currently describes as unchanged from previous  times.  The pain has no relationship except it is made worse by eating and  he eats frequent small meals.  He also has noticed that after he eats, he  has a fairly soft bowel movement which partially relieves his pain but it  does awaken him at night.  He has had no melena or hematochezia, anorexia,  weight loss, or any hepatobiliary complaints.  He denies any genitourinary  problems at this time.   PAST MEDICAL HISTORY:  Remarkable for:  1. Obesity.  2. Hypertension.  3. Adult onset diabetes.  4. Rather marked cardiovascular coronary artery disease.  5. There has also been a question in the past of whether or not he might      have had a pulmonary embolism.  He has been diagnosed as having chronic      anxiety and depression.   MEDICATIONS:  1. Metformin 1000 mg twice a day.  2. Actos 15 mg a day.  3. Crestor 20 mg q day.  4. Atenolol 50 mg a day.  5. Nexium 40 mg twice a day.  6.  Coumadin 2.5 mg a day.  7. Verelan 180 mg a day.  8. HCTZ 25 mg a day.  9. Fluoxetine 40 mg a day.  10.Clorazepate 3.7 mg twice a day.  11.Asmanex inhalers daily.  12.Vitamin C and E daily.  13.Indocin p.r.n.  14.Tessalon and Tussionex.   ALLERGIES:  He denies drug allergies.   FAMILY HISTORY:  Noncontributory except cancer runs in my family.   SOCIAL HISTORY:  He is married and lives with his wife.  He has a 12th grade  education.  He is retired from truck driving. He does not smoke or use  ethanol.   REVIEW OF SYSTEMS:  Remarkable for insomnia, chronic back pain, chronic  fatigue. He denies current genitourinary, cardiovascular or pulmonary  complaints.  Review of systems otherwise noncontributory.   PHYSICAL EXAMINATION:  GENERAL:  He is a healthy, obese white male in no  acute distress.  I cannot appreciate stigmata of chronic liver disease.  VITAL SIGNS:  He is 6 feet 3 inches tall, weighs 300 pounds.  Blood pressure  124/74, pulse 82 and  irregular.  CHEST: Generally clear.  CARDIAC:  Unremarkable except for an irregular rate.  ABDOMEN:  Rather active bowel sounds with an enlarged spleen in the left  upper quadrant and a palpable liver edge in the right upper quadrant.  There  were no other masses or tenderness or evidence of ascites.  MENTAL STATUS:  Clear.  RECTAL:  Deferred.   ASSESSMENT:  1. 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  I do think we need to explain an underlying structural      lesion for his hepatosplenomegaly which probably is related to fatty      infiltration of his liver.  2. Hepatosplenomegaly as mentioned above.  3. Adult onset diabetes.  4. Atherosclerosis with marked cardiovascular disease, chronic      anticoagulation.  5. History of hypertension.  6. Chronic obesity.  7. History of chronic anxiety and depression.   RECOMMENDATIONS:  1. Repeat lab parameters.  2. Outpatient abdominal CT scan, last performed four years ago.  3. Trial of Xifaxin 400 mg t.i.d. for 10 days along with daily Align      probiotic therapy.  4. GI followup in two weeks' time and consider repeat endoscopic exams.                                   Vania Rea. Jarold Motto, MD, [facility], [place]   DRP/MedQ  DD:  10/27/2005  DT:  10/28/2005  Job #:  [id number]   cc:   Jethro Bastos, M.D.

## 2010-06-27 NOTE — Assessment & Plan Note (Signed)
[facility]                              CARDIOLOGY OFFICE NOTE   DELROY, ORDWAY                      MRN:          [id number]  DATE:08/21/2005                            DOB:          13-Nov-1940    PRIMARY CARE PHYSICIAN:  Dr. Marny Lowenstein   REASON FOR PRESENTATION:  Patient with permanent atrial fibrillation.   HISTORY OF PRESENT ILLNESS:  The patient returns for followup.  He is now 70  years old.  He has permanent atrial fibrillation with rate control and  anticoagulation.  He has had no new problems since I last saw him.  He  occasionally will feel the heart rate and had an episode 2-3 weeks ago where  he thought it was skipping and going slow.  He had no presyncope or syncope.  He had no chest pain or shortness of breath.  He has had no PND or  orthopnea.  He tolerated back surgery recently.  He walks on a treadmill  daily to help his back and he has no significant limitations with this.   PAST MEDICAL HISTORY:  Persistent atrial fibrillation, status post  asthmaticus, history of pulmonary embolism, hypertension, headaches,  gastroesophageal reflux disease, morbid obesity, anxiety/depression,  hyperlipidemia, repair of a left forearm wound, knee surgery, bilateral heel  spur surgery, back surgery.   ALLERGIES:  NONE.   MEDICATIONS:  1.  Metformin 1000 mg b.i.d.  2.  Actos 15 mg daily.  3.  Crestor 20 mg daily.  4.  Atenolol 50 mg daily.  5.  Nexium 40 mg b.i.d.  6.  Coumadin per the Coumadin clinic.  7.  Verelan 180 mg daily.  8.  Hydrochlorothiazide 25 mg daily.  9.  Fluoxetine 40 mg a day.  10. Clorazepate.  11. Vitamin C.  12. Vitamin E.  13. Asmanex.   REVIEW OF SYSTEMS:  As stated in HPI and otherwise negative for other  systems.   PHYSICAL EXAMINATION:  GENERAL:  The patient is in no distress.  VITAL SIGNS:  Blood pressure 131/80, heart rate 79 and irregular, weight 294  pounds, body mass index 37.  HEENT:   Eyes unremarkable, pupils equal, round, react to light.  Fundi not  visualized.  NECK:  No jugular venous distention, waveform within normal limits.  Carotid  upstroke brisk and symmetrical but no bruits.  No thyromegaly.  LYMPHATICS:  No adenopathy.  LUNGS:  Clear to auscultation bilaterally.  BACK:  No costovertebral angle tenderness.  CHEST:  Unremarkable.  HEART:  PMI not displaced or sustained, S1 and S2 within normal limits, no  S3, no murmurs.  ABDOMEN:  Obese, positive bowel sounds, normal in frequency and pitch, no  bruits, no rebound, no guarding, no midline pulsatile mass, no organomegaly.  SKIN:  No rashes, no nodules.  EXTREMITIES:  2+ pulses, no edema, no cyanosis, no clubbing.  NEURO:  Oriented to person, place and time, cranial nerves 2-12 grossly  intact, motor grossly intact.   ELECTROCARDIOGRAM:  Atrial fibrillation, axis within normal limits,  intervals within normal limits, no acute ST changes.   ASSESSMENT AND  PLAN:  1.  Atrial fibrillation.  The patient has rate control and tolerates      anticoagulation.  No further cardiovascular testing or change in therapy      is warranted.  2.  Morbid obesity.  Again, we discussed weight loss.  He eats very little      and we will continue to try to monitor his diet and lose weight with      exercise.  3.  Followup.  He can come back to this clinic in 1 year or sooner if      needed.                                   Rollene Rotunda, MD, [facility]   JH/MedQ  DD:  08/21/2005  DT:  08/21/2005  Job #:  [id number]   cc:   Jethro Bastos, MD

## 2010-06-27 NOTE — Assessment & Plan Note (Signed)
[facility]                             PRIMARY CARE OFFICE NOTE   KWEKU, STANKEY                    MRN:          [id number]  DATE:11/16/2005                            DOB:          20-Aug-1940    CHIEF COMPLAINT:  New patient to the practice.   HISTORY OF PRESENT ILLNESS:  The patient is a 70 year old white male, here  to establish primary care.  He has been followed by Dr. Jethro Bastos  in the past for primary care.  He was followed by Dr. Rollene Rotunda for his  history of coronary artery disease and permanent atrial fibrillation, for  which he is on chronic Coumadin/anti-coagulation.  He has also been recently  evaluated by Dr. Vania Rea. Jarold Motto for irritable bowel syndrome-type  complaints with a history of gastroparesis.   He has been diabetic since 1994.  He states that his blood sugars are fairly  well controlled.  He is unaware of his A1c, but his fasting a.m. sugars are  in the 110's to 120's.  The patient has been on Metformin and Actos therapy.  He denies any complications such as diabetic nephropathy or retinopathy and  denies any numbness or tingling in his hands or feet, consistent with  peripheral neuropathy.  He has been somewhat frustrated with the inability  to lose weight.  His heaviest weight has been 326 pounds.  His current  weight is 306 pounds.  He was evaluated at a bariatric clinic approximately  six to eight months ago, where he achieved his lowest weight at 280 pounds.   He complains of chronic fatigue.  He does have some daytime somnolence.  Upon further questioning, his wife has noted loud snoring at night.  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  He is  retired and has had difficulty resetting his clock/sleep-wake cycles.   PAST MEDICAL HISTORY:  1. Coronary artery disease.  2. Atrial fibrillation, on chronic Coumadin.  3. History of asthma.  4. History of  pulmonary embolism.  5. Hypertension.  6. Gastroesophageal reflux disease.  7. Hyperlipidemia.  8. Status post left knee replacement.  9. Chronic low back pain, status post two back surgeries.  10.Four operations/surgeries to his feet for heel spurs.   SOCIAL HISTORY:  The patient is married.  Lives with his wife.  He has a  grown daughter.  Is a retired Naval architect.  He denies any alcohol or  tobacco use.   CURRENT MEDICATIONS:  1. Metformin 1000 mg p.o. b.i.d.  2. Actos 15 mg once daily.  3. Crestor 20 mg q.h.s.  4. Nexium 40 mg a.c.  5. Coumadin as directed by the Coumadin clinic.  6. Verelan 180 mg once a day.  7. Hydrochlorothiazide 25 mg q. morning.  8. Fluoxetine 40 mg once a day.  9. Clorazepate 3.75 mg once a day.  10.Vitamin C, 500 mg once a day.  11.Vitamin E 400 IU, one a day.  12.Asthmatics inhaler, one puff a day.  13.Atenolol 50 mg once a day.  14.Indocin  50 mg q.8h. p.r.n.   ALLERGIES:  SULFA, which causes anaphylaxis.   FAMILY HISTORY:  Significant for a brother with colon cancer.  Father with  heart disease and high blood pressure.   REVIEW OF SYSTEMS:  No HEENT symptoms.  Denies any chest pain.  No shortness  of breath.  No active wheezing.  Has not used his nebulizer in approximately  six months.  Denies any dyspnea.  He walks on a treadmill 20-30 minutes  every day.  Ongoing GI complaints, including dyspepsia and gas.  All other  systems negative.   PHYSICAL EXAMINATION:  VITAL SIGNS:  Height 6 feet 3 inches, weight 206  pounds.  Temperature 97.3 degrees, pulse 87, blood pressure 129/85 on the  left side in the seated position.  GENERAL:  The patient is a pleasant, morbidly-obese 70 year old white male,  who appears somewhat older than his stated age.  HEENT:  Normocephalic and atraumatic.  Pupils equal and reactive to light  bilaterally.  EOMs intact.  The patient was anicteric.  Conjunctivae within  normal limits.  External auditory canals and  tympanic membranes were clear  bilaterally.  Hearing was grossly normal.  Oropharynx:  Somewhat crowded,  otherwise unremarkable.  NECK:  Supple, no adenopathy, carotid bruits or thyromegaly.  CHEST:  Normal expiratory effort.  LUNGS:  Clear to auscultation bilaterally without any evidence of rhonchi,  rales or wheezing.  CARDIOVASCULAR:  Irregularly irregular, with no significant murmurs, rubs or  gallops appreciated.  ABDOMEN:  Protuberant.  No organomegaly appreciated.  Positive bowel sounds.  MUSCULOSKELETAL:  No clubbing, cyanosis or edema.  The patient had intact  pedis dorsalis pulses.  Had intact sensation to temperature and vibration in  a dependent manner over the lower extremities.  SKIN:  Warm and dry.  NEUROLOGIC:  Cranial nerves II-XII  grossly intact.  He was non-focal.   IMPRESSION:  1. Longstanding type 2 diabetes with morbid obesity.  2. History of chronic atrial fibrillation, on Coumadin.  3. Dyslipidemia.  4. Hypertension.  5. History of asthma, quiescent.  6. Ongoing irritable bowel syndrome, versus gastroesophageal reflux      disease.  7. Question of history of a pulmonary embolism.  8. Status post left knee replacement.  9. Status post back surgery.  10.Health maintenance.   RECOMMENDATIONS:  1. The patient is somewhat frustrated by his weight gain.  We discussed      potential agents to assist in weight loss.  He will be started on      Byetta 5 mcg subcu b.i.d.  We discussed the possible side effects with      worsening nausea and also may worsen gastroparesis.  He is to continue      his Nexium b.i.d.  2. He will obtain a hemoglobin A1c, as well as a micro-albumin to      creatinine ratio.  The patient follows with an ophthalmologist on a      yearly basis. There is no evidence of peripheral neuropathy at this      time.  3. The patient's daytime somnolence and fatigue is somewhat concerning for     probable obstructive sleep apnea, and on follow-up  visit we will      further discuss the need for a polysomnography.  4. In addition to his walking regimen, I recommended mild strength      training, to increase muscle mass and increase his metabolism.  The      patient is to log his meals  every day, to estimate his daily caloric      intake.  5. On follow-up visit, we will need to address dyslipidemia with the      concomitant use of vitamin E.  The patient should discontinue his      vitamin E supplement.   FOLLOWUP:  His followup time is in approximately one month.   NOTATION:  Please note that as part of his health maintenance, he was up-  dated with the influenza vaccine today.       Barbette Hair. Artist Pais, DO      RDY/MedQ  DD:  11/16/2005  DT:  11/17/2005  Job #:  [id number]

## 2010-06-27 NOTE — H&P (Signed)
NAMEBRITNEY, CAPTAIN NO.:  [id number]   MEDICAL RECORD NO.:  [id number]          PATIENT TYPE:  INP   LOCATION:  2899                         FACILITY:  [facility]   PHYSICIAN:  Hilda Lias, M.D.   DATE OF BIRTH:  Jul 27, 1940   DATE OF ADMISSION:  01/21/2005  DATE OF DISCHARGE:                                HISTORY & PHYSICAL   Mr. Swinger is a gentleman who came to my office because of neck and back  pain.  He tells me that this problem had been going on for more than 30  years.  Several years ago, he had some lumbar surgery at the level of 4-5.  From then on, he developed chronic radiculopathy with neuropathy  compromising the L5-S1 nerve root.  Now he has developed radicular pain  going to the left leg, also neck pain every time he moves his neck.  The  patient has been seen by an orthopedic surgeon who told him that he needed 4-  disc fusion in his neck and then lower back.  He came for a second opinion.   PAST MEDICAL HISTORY:  As in Review of Systems.   ALLERGIES:  SULFA   PAST SURGICAL HISTORY:  In 2003, he had what looks like L4-5 diskectomy on  the right side.   SOCIAL HISTORY:  Negative.   FAMILY HISTORY:  History of heart disease in family.   REVIEW OF SYSTEMS:  Positive for hypertension, heart murmur, asthma,  bronchitis, back pain, neck pain, bursitis, weakness, depression, atrial  fibrillation and DVT and use of Coumadin for that.   PHYSICAL EXAMINATION:  GENERAL: The patient came to my office.  He has  difficulty with sitting and standing.  HEAD, EYES, EARS, NOSE, AND THROAT:  Normal.  SKIN AND MUCOSA:  Changes of the skin __________.  LUNGS:  At the lower base, there is some mild rhonchi bilaterally.  CARDIOVASCULAR: Normal.  ABDOMEN:  Normal.  EXTREMITIES:  Edema in the lower extremities.  NEUROLOGIC:  Reflexes normal.  Strength is normal in the upper extremities.  In the lower extremities, he has some weakness of dorsiflexion of the  left  foot with straight leg raise positive 60 degrees.  Femoral stretch maneuver  is done bilaterally.   Lower spinal x-ray showed at level of L3 he has no space whatsoever.  He has  disease at the thoracic 11 and 12 as well.  Also has some degenerative  changes at the level of L2 and 3.   The patient underwent lumbar myelogram which showed that he has a herniated  disk at the level of T8-T9.  At the level of 3-4 and 4-5, he had  degenerative disease with facet arthropathy.   CLINICAL IMPRESSION:  Degenerative disk disease L3-L4 and L4-L5 with chronic  radiculopathy.   RECOMMENDATIONS:  The patient is going to be admitted for surgery.  The  procedure will be 3-4, 4-5 diskectomy, interbody fusion with pedicle screws.  He knows about the risks such as infection, CSF leak, worsening of pain, no  improvement whatsoever or improvement in the pain. All risks discussed  associated with comorbidity of atrial fibrillation.           ______________________________  Hilda Lias, M.D.     EB/MEDQ  D:  01/21/2005  T:  01/21/2005  Job:  [id number]

## 2010-06-27 NOTE — Op Note (Signed)
NAMEKAIRE, STARY NO.:  [id number]   MEDICAL RECORD NO.:  [id number]          PATIENT TYPE:  INP   LOCATION:  2899                         FACILITY:  [facility]   PHYSICIAN:  Hilda Lias, M.D.   DATE OF BIRTH:  [birth year]   DATE OF PROCEDURE:  01/21/2005  DATE OF DISCHARGE:                                 OPERATIVE REPORT   PREOPERATIVE DIAGNOSES:  1.  Degenerative disk disease, L3-4, with L4-5, chronic lower back pain,      chronic L5 radiculopathy, status post L4-5 diskectomy.  2.  Obesity.  3.  Diabetes.  4.  Hypertension.  5.  Coumadin therapy.   POSTOPERATIVE DIAGNOSES:  1.  Degenerative disk disease, L3-4, with L4-5, chronic lower back pain,      chronic L5 radiculopathy, status post L4-5 diskectomy.  2.  Obesity.  3.  Diabetes.  4.  Hypertension.  5.  Coumadin therapy.   PROCEDURES:  Bilateral L3 and L4 laminectomy, bilateral L3 and L4  facetectomy, total L4-5 diskectomy, lysis of adhesions to decompress the L4  and L5 nerve root in the right side, interbody fusion with cages and  autograph and BMP, pedicle screws, L3, L4, L5 with posterolateral  arthrodesis, C-arm, Cell Saver.   SURGEON:  Hilda Lias, M.D.   ASSISTANT:  Coletta Memos, M.D.   CLINICAL HISTORY:  The patient is admitted because of back pain with  radiation to both legs.  The patient has been seen by somebody else, who  advised fusion from the lower thoracic area to the sacrum.  The patient has  had __________ between L3 and L4 with degenerative disk disease at the level  of L4-5.  Surgery was advised, and the risks were complained to him.  Although the procedure is straightforward, nevertheless this gentleman  medically has a lot of complications with DVT, being on Coumadin,  hypertension, obesity, diabetes.   PROCEDURE:  The patient was taken to the OR, and he was positioned in a  prone manner.   He was prepped and drapes were applied.  A midline incision was made and  muscle was retracted laterally.  With x-ray we identified the L3-4 and L4-5.  We went ahead and removed the spinous process of L3 and L4 as well as the  lamina and the facet.  We identified the L3-4 space and, indeed, the area  was quite narrowed that it was impossible to introduce a spinal needle.  Because of that, we were unable to do the interbody fusion.  Nevertheless,  at the level of L4-5 we found quite a bit of scar tissue in the right side  where he previously had surgery.  Lysis was accomplished.  The nerve was  swollen and reddish.  Then we entered the disk space and total gross  diskectomy was removed.  The end plates also were removed.  Then two cages  of 10 x 24 were inserted.  Inside the cages we have BMP as well as  autograft.  Having done this, we filled up the rest of the disk with more  BMP and autograft.  The BMP was  inserted anterior away from the dura mater.  Having good decompression, with the C-arm we localized the pedicle of L3,  L4, L5.  Pedicle probe as well as tap were inserted.  At the end we  introduce six screws of 6.5 x 45.  AP and lateral showed good position of  the pedicle screws.  From then on a rod was applied and was secured in place  with the cap.  A crosslink between right and left rods was used.  Then we  investigated again the foramina.  They were quite open.  The area was  irrigated.  Valsalva maneuver was negative.  We went lateral and we drilled  the facet and lateral aspect of the facets of L3, L4 and L5 as well as the  transverse processes.  The rest of the BMP and autograft was used for a  posterolateral arthrodesis.  The area was irrigated and the wound was closed  with Vicryl and Steri-Strip.           ______________________________  Hilda Lias, M.D.     EB/MEDQ  D:  01/21/2005  T:  01/22/2005  Job:  [id number]

## 2010-06-27 NOTE — Op Note (Signed)
[facility]  Patient:    [name], [name]                    MRN: [id number] Proc. Date: 06/09/00 Adm. 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  He had previous arthroscopy of his knee that shows a large gouged out area of cartilage in the femoral condyle in the past.  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  He had previous arthroscopy of his knee that shows a large gouged out area of cartilage in the femoral condyle in the past.  OPERATION PERFORMED:  Left total knee arthroplasty.  The type was [employer]. All three components were cemented.  The sizes used were a 26 patella, size 9 tibial tray, size 9 femoral component and a 10 mm thickness tibial insert.  I utilized the posterior cruciate sacrificing type knee.  SURGEON:  Georges Lynch. Darrelyn Hillock, M.D.  ASSISTANT:  Javier Docker, M.D.  ANESTHESIA:  Spinal.  DESCRIPTION OF PROCEDURE:  Under spinal anesthesia, routine orthopedic prepping and draping of the left lower extremity was carried out.  The leg was exsanguinated with the Esmarch and the tourniquet was elevated to 400 mmHg. An anterior approach to the knee was carried out.  The flaps were sutured down in place in the usual fashion.  Following this, I carried out a median parapatellar incision.  I reflected the patella laterally, flexed the knee and excised the anterior posterior cruciate ligaments and completed a lateral and medial meniscectomy.  I then made my initial drill hole in the intercondylar notch.  The guide rod was entered and a #1 jig was inserted.  We carried out our appropriate distal femoral cut at 10 mm thickness.  Following this, the #2 jig was inserted and I  carried out my anterior and posterior chamfering cuts for a size 9 left femur.  We then prepared the tibia by removing 4 mm thickness off the medial articular surface of the tibia.  We utilized the anterior medullary guide for this cut.  Once the tibia was prepared, we then cut our groove cut out of the intercondylar notch of the femur.  We then went through the trials and we utilized a size 9 left femur, size 9 tibial tray with a 10 mm thickness insert and we had good stability.  We then made our appropriate patellar cut.  We removed 10 mm thickness off the central  portion of the patella for a size 26 patella.  Three drill holes were made in the patella.  We thoroughly irrigated out the area.  We then flushed the knee and cut our keel cut in the proximal tibia.  Following this, we removed the two trials again and then we removed the trials, thoroughly waterpicked the knee, dried the knee out and cemented all three components in simultaneously. Following this, we did search for a loose piece of cement and removed a loose piece of cement.  We then irrigated again and dried the knee out and inserted our permanent 10 mm thickness posterior cruciate sacrificing type tibial component.  The knee was reduced.  A Hemovac was inserted.  We irrigated the knee again and closed the knee in layers in the usual fashion.  We did bone wax the raw ends of the bone prior to closing the knee.  Skin was closed with metal staples and a sterile Neosporin dressing was applied.  He had 1 gram of IV Ancef preoperatively. DD:  06/09/00 TD:  06/10/00 Job: [id number]

## 2010-06-27 NOTE — Assessment & Plan Note (Signed)
[facility]                           GASTROENTEROLOGY OFFICE NOTE   GRAYSYN, BACHE                    MRN:          [id number]  DATE:01/01/2006                            DOB:          11/04/1940    [name] is a 70 year old white male with adult-onset diabetes managed with oral  medications with fairly good control with recent hemoglobin A1c of 6.2.  Also has atrial fibrillation, and is on multiple cardiac medications  including atenolol.  He suffers from rather severe exogenous obesity.  He is  chronically on Coumadin therapy.   I have seen him for many years because of gastroparesis and symptomatic  diverticulosis.  He was recently empirically treated for possible bacterial  overgrowth syndrome with Xifaxan 400 mg t.i.d. along probiotic therapy in  September of this year.  That was after screening laboratory data was  unremarkable as was CT scan of the abdomen on October 30, 2005.  The  patient does take Nexium therapy chronically on a twice a day basis, is on  chronic Indocin therapy.   He recently saw Dr. Artist Pais, who suggested that he perhaps go on Byetta, which  the patient did not take because of his fears of worsening gastroparesis.  He comes in today complaining of some periodic pain in his left lower  quadrant, and it is really is not in the epigastric area.  He says that he  otherwise is feeling fine and denies any systemic complaints.   He weighs 298 pounds.  Blood pressure is 122/84.  Pulse was 60 and regular.  Abdominal exam was difficult because of rather massive obesity, but I could  not appreciate any definite masses or tenderness.   ASSESSMENT:  I think most of [name]'s pain at this time is related to  symptomatic diverticulosis.  He does have known diabetic gastroparesis, and  perhaps recurrent bacterial overgrowth syndrome, which seems stable at this  time.  He has tried Reglan in the past without symptomatic improvement, on  reviewing his record.   RECOMMENDATIONS:  1. Continue high-fiber diet as tolerated.  2. Levsin p.r.n. 0.125 mg every 6 to 8 hours.  3. Continue gastroparesis diet.  4. Continue PPI therapy.  5. GI followup as needed.  6. Continue medical followup with Dr. Artist Pais.     Vania Rea. Jarold Motto, MD, Caleen Essex, FAGA  Electronically Signed    DRP/MedQ  DD: 01/01/2006  DT: 01/01/2006  Job #: [id number]   cc:   Barbette Hair. Artist Pais, DO

## 2010-06-27 NOTE — Discharge Summary (Signed)
[name], [name] NO.:  [id number]   MEDICAL RECORD NO.:  [id number]          PATIENT TYPE:  INP   LOCATION:  3035                         FACILITY:  [facility]   PHYSICIAN:  Stefani Dama, M.D.  DATE OF BIRTH:  1940/06/04   DATE OF ADMISSION:  01/21/2005  DATE OF DISCHARGE:  01/24/2005                                 DISCHARGE SUMMARY   ADMITTING DIAGNOSIS:  Lumbar spondylosis with stenosis and radiculopathy  lumbar vertebrae-3-4 and lumbar vertebrae-4-5.   FINAL DIAGNOSES:  1.  Lumbar spondylosis with stenosis and radiculopathy lumbar vertebrae-3-4      and lumbar vertebrae-4-5.  2.  Atrial fibrillation.  3.  Coumadin anticoagulation.  4.  Depression.  5.  Heart murmur.  6.  Asthma.  7.  Bronchitis.  8.  Hypertension.   CONDITION ON DISCHARGE:  Improving.   HOSPITAL COURSE:  Mr. Vigen is 70 year old individual who has had  significant spondylitic degeneration of lower lumbar spine. He has had  problems with bronchitis and atrial fibrillation and has been on Coumadin  anticoagulation. He has had previous back surgery apparently at L4-L5. The  patient was advised regarding further surgical decompression and  stabilization from L3-L5 and he was admitted to hospital and underwent  decompressive and stabilization procedure on January 21, 2005.   Postoperatively, he is gradually mobilized; however, the patient complained  of significant back pain, significant difficulty with swallowing and  esophageal irritability. His pain medications were gradually switched to  orals and he is tolerating oxycodone in 10 milligrams strength for pain  control. His incision has been clean and dry and he has been advised as to  his postoperative activities. He is discharged at this time with  prescription for Percocet 5/325, #100, without refills, Valium 5 milligrams  #50 without refills. He will be seen in the office in three weeks time by  Dr. Jeral Fruit. Condition on  discharge is improving.      Stefani Dama, M.D.  Electronically Signed     HJE/MEDQ  D:  01/24/2005  T:  01/25/2005  Job:  [id number]

## 2010-07-01 ENCOUNTER — Encounter: Payer: Self-pay | Admitting: Family Medicine

## 2010-07-02 ENCOUNTER — Ambulatory Visit: Payer: Medicare Other

## 2010-07-03 ENCOUNTER — Ambulatory Visit: Payer: Medicare Other

## 2010-07-04 ENCOUNTER — Ambulatory Visit (INDEPENDENT_AMBULATORY_CARE_PROVIDER_SITE_OTHER): Payer: Medicare Other | Admitting: Family Medicine

## 2010-07-04 DIAGNOSIS — Z5181 Encounter for therapeutic drug level monitoring: Secondary | ICD-10-CM

## 2010-07-04 DIAGNOSIS — I4891 Unspecified atrial fibrillation: Secondary | ICD-10-CM

## 2010-07-04 DIAGNOSIS — Z7901 Long term (current) use of anticoagulants: Secondary | ICD-10-CM

## 2010-07-04 LAB — POCT INR: INR: 3.2

## 2010-07-10 ENCOUNTER — Other Ambulatory Visit: Payer: Self-pay | Admitting: *Deleted

## 2010-07-10 MED ORDER — METOPROLOL SUCCINATE ER 25 MG PO TB24: 25.0000 mg | ORAL_TABLET | Freq: Two times a day (BID) | ORAL | Status: DC

## 2010-07-10 MED ORDER — FLUOXETINE HCL 40 MG PO CAPS: 40.0000 mg | ORAL_CAPSULE | Freq: Every day | ORAL | Status: DC

## 2010-07-10 MED ORDER — FENOFIBRATE 160 MG PO TABS: 160.0000 mg | ORAL_TABLET | Freq: Every day | ORAL | Status: DC

## 2010-07-10 MED ORDER — OMEPRAZOLE 40 MG PO CPDR: 40.0000 mg | DELAYED_RELEASE_CAPSULE | Freq: Two times a day (BID) | ORAL | Status: DC

## 2010-07-18 ENCOUNTER — Other Ambulatory Visit: Payer: Self-pay | Admitting: *Deleted

## 2010-07-18 MED ORDER — OMEPRAZOLE 40 MG PO CPDR: 40.0000 mg | DELAYED_RELEASE_CAPSULE | Freq: Two times a day (BID) | ORAL | Status: DC

## 2010-07-23 ENCOUNTER — Other Ambulatory Visit: Payer: Self-pay | Admitting: *Deleted

## 2010-07-23 MED ORDER — OMEPRAZOLE 40 MG PO CPDR: 40.0000 mg | DELAYED_RELEASE_CAPSULE | Freq: Two times a day (BID) | ORAL | Status: DC

## 2010-08-01 ENCOUNTER — Ambulatory Visit (INDEPENDENT_AMBULATORY_CARE_PROVIDER_SITE_OTHER): Payer: Medicare Other | Admitting: Internal Medicine

## 2010-08-01 DIAGNOSIS — I999 Unspecified disorder of circulatory system: Secondary | ICD-10-CM | POA: Insufficient documentation

## 2010-08-01 DIAGNOSIS — Z5181 Encounter for therapeutic drug level monitoring: Secondary | ICD-10-CM

## 2010-08-01 DIAGNOSIS — I824Y9 Acute embolism and thrombosis of unspecified deep veins of unspecified proximal lower extremity: Secondary | ICD-10-CM | POA: Insufficient documentation

## 2010-08-01 DIAGNOSIS — Z7901 Long term (current) use of anticoagulants: Secondary | ICD-10-CM

## 2010-08-01 DIAGNOSIS — I4891 Unspecified atrial fibrillation: Secondary | ICD-10-CM

## 2010-08-01 LAB — POCT INR: INR: 4.4

## 2010-08-01 NOTE — Patient Instructions (Signed)
HOLD TODAY AND TOMORROW THEN 2.5 MG DAILY, 5 MG TUES THURS, RECHECK 1 WEEK

## 2010-08-08 ENCOUNTER — Ambulatory Visit (INDEPENDENT_AMBULATORY_CARE_PROVIDER_SITE_OTHER): Payer: Medicare Other | Admitting: Family Medicine

## 2010-08-08 DIAGNOSIS — I4891 Unspecified atrial fibrillation: Secondary | ICD-10-CM

## 2010-08-08 DIAGNOSIS — Z5181 Encounter for therapeutic drug level monitoring: Secondary | ICD-10-CM

## 2010-08-08 DIAGNOSIS — Z7901 Long term (current) use of anticoagulants: Secondary | ICD-10-CM

## 2010-08-08 NOTE — Patient Instructions (Signed)
2.5 mg daily, 5 mg Fri, Tues Recheck 2 weeks

## 2010-08-22 ENCOUNTER — Ambulatory Visit (INDEPENDENT_AMBULATORY_CARE_PROVIDER_SITE_OTHER): Payer: Medicare Other | Admitting: Family Medicine

## 2010-08-22 DIAGNOSIS — I4891 Unspecified atrial fibrillation: Secondary | ICD-10-CM

## 2010-08-22 DIAGNOSIS — Z5181 Encounter for therapeutic drug level monitoring: Secondary | ICD-10-CM

## 2010-08-22 DIAGNOSIS — Z7901 Long term (current) use of anticoagulants: Secondary | ICD-10-CM

## 2010-08-22 NOTE — Patient Instructions (Addendum)
Hold 2 doses then 2.5 mg daily Recheck 2 weeks

## 2010-09-02 ENCOUNTER — Ambulatory Visit (INDEPENDENT_AMBULATORY_CARE_PROVIDER_SITE_OTHER): Payer: Medicare Other | Admitting: Family Medicine

## 2010-09-02 ENCOUNTER — Encounter: Payer: Self-pay | Admitting: Family Medicine

## 2010-09-02 VITALS — BP 120/84 | HR 72 | Temp 98.2°F | Ht 74.0 in | Wt 295.0 lb

## 2010-09-02 DIAGNOSIS — K5732 Diverticulitis of large intestine without perforation or abscess without bleeding: Secondary | ICD-10-CM

## 2010-09-02 MED ORDER — CIPROFLOXACIN HCL 750 MG PO TABS: 750.0000 mg | ORAL_TABLET | Freq: Two times a day (BID) | ORAL | Status: AC

## 2010-09-02 MED ORDER — METRONIDAZOLE 500 MG PO TABS: 500.0000 mg | ORAL_TABLET | Freq: Four times a day (QID) | ORAL | Status: AC

## 2010-09-02 NOTE — Progress Notes (Signed)
  Subjective:    Patient ID: [name], male    DOB: 1940/12/08, 70 y.o.   MRN: [id number]  HPI  70 year old male with history of DM , gastroparesis, asthma/COPD, and past  Colonic diverticulosis present with  LLQ abdominal pain x 6 days ago No N/V, no diarrhea, no constipation. No fever.  Gradually worsening day to day. Sore to touch, no pain going over bumps.  Has been eating a lot of  Sunflower seeds 1 week ago. Pain started after he did this.   Has also been seeing Dr. Modesto Charon for steroid injections in back. Has follow up with Dr. Yevette Edwards.[facility] Shih surgeon to discuss surgery.     Review of Systems  Constitutional: Negative for fever and fatigue.  Respiratory: Negative for shortness of breath.   Cardiovascular: Negative for chest pain.  Gastrointestinal: Negative for abdominal pain, diarrhea, constipation and blood in stool.  Genitourinary: Negative for dysuria, penile pain and testicular pain.  Musculoskeletal: Positive for back pain and arthralgias.       Objective:   Physical Exam  Constitutional: He appears well-developed and well-nourished.  Cardiovascular: Normal rate, normal heart sounds and intact distal pulses.  Exam reveals no gallop and no friction rub.   No murmur heard. Pulmonary/Chest: Effort normal and breath sounds normal. No respiratory distress. He has no wheezes. He has no rales.  Abdominal: Soft. Bowel sounds are normal. He exhibits no distension and no mass. There is no hepatosplenomegaly. There is tenderness in the left lower quadrant. There is no rebound, no guarding and no CVA tenderness.          Assessment & Plan:

## 2010-09-02 NOTE — Patient Instructions (Addendum)
Have coumadin checked  Where you  usually have it checked  in next few days while on antibiotics to make sure not thinning blood too much.  Start Cipro and Flagyl for 10 days. Push fluids, bland diet and rest. Go to ER if severe abdominal pain , vomiting or fever on antibiotics. Follow up in 2 days.[name]

## 2010-09-02 NOTE — Assessment & Plan Note (Signed)
Emperic treatment for presumed dicerticulitis given symptoms and history.  Start cipro and flagyl x 10 days. Push fluids and close follow up.

## 2010-09-03 ENCOUNTER — Telehealth: Payer: Self-pay | Admitting: *Deleted

## 2010-09-03 ENCOUNTER — Telehealth: Payer: Self-pay | Admitting: Gastroenterology

## 2010-09-03 NOTE — Telephone Encounter (Signed)
OK 

## 2010-09-03 NOTE — Telephone Encounter (Signed)
Pt with hx of Diabetic Gastoparesis, Symptomatic Diverticulosis, Delayed Gastric Emptying, Chronic Afib on Coumadin, Chronic Upper left Quad. Pain, Abd. Wall Neuropathy, Bacterial Overgrowth Syndrome and Obesity. Last OV 01/28/2007 and it was written for him to see Dr Alycia Rossetti at [facility], but today, py stated he didn't go and doesn't remember the referral, COLON  02/01/2002 revealing Diverticulosis, int. Hemorrhoids, Diabetic Gut Enteropathy, Visceral Neuropathy, Associated GI Motility Disorder EGD 04/03/2002 Duodenitis w/o Hemorrhage COLON 12/02/2005 Diverticulosis  Pt saw Dr [name] yesterday and stated the pain started after he ate a few sunflower seeds; he has a f/u tomorrow. Pt was given Cipro and Flagyl bid x 10 days. He denies a temp, diarrhea and blood in his stools which he states are regular.  Appt. With Dr Jarold Motto 09/18/10 at 0830am.  Dr Jarold Motto, ok with Cipro and Flagyl x 10days and to wait until 09/17/08; agree? Thanks.

## 2010-09-03 NOTE — Telephone Encounter (Signed)
Faxed note from [facility] regarding possible drug interaction between metronidazole, warfaren.  Note is on your desk.

## 2010-09-03 NOTE — Telephone Encounter (Signed)
Notified wife that Dr Jarold Motto agrees with Dr Daphine Deutscher plan of care; he does f/u with her tomorrow. By his 09/18/10 appt, is about the right time to be seen by [place]. Wife stated understanding.

## 2010-09-04 ENCOUNTER — Encounter: Payer: Self-pay | Admitting: Family Medicine

## 2010-09-04 ENCOUNTER — Ambulatory Visit (INDEPENDENT_AMBULATORY_CARE_PROVIDER_SITE_OTHER): Payer: Medicare Other | Admitting: Family Medicine

## 2010-09-04 DIAGNOSIS — K5732 Diverticulitis of large intestine without perforation or abscess without bleeding: Secondary | ICD-10-CM

## 2010-09-04 NOTE — Telephone Encounter (Signed)
Reviewed, discussed with pt  At todays appt.

## 2010-09-04 NOTE — Assessment & Plan Note (Signed)
Gradually improving with flagyl and cipro. No red flags for rupture at this time. Tolerating fluids and oral antibiotics. If not conitnuing to improve by MOnday.. Consider abd/pelvic CT to verify issue is diverticulitis.

## 2010-09-04 NOTE — Patient Instructions (Signed)
Go to ER if severe pain, N/V, not keeping antibitoics. Call on Monday if not continuing to improve.

## 2010-09-04 NOTE — Progress Notes (Signed)
  Subjective:    Patient ID: [name], male    DOB: [birth year], 70 y.o.   MRN: [id number]  HPI  70 year old male with hx of diverticulosis  Presented with LLQ pain on 7/24... Started at that time on emperic treatment for diverticulitis with Flagyl and cipro. He is here today for close follow up. Today he states he has noted som gradual improvement, 30 % from yesterday. Still some pain in LLQ. No N/V, some early satiety. No fever. No D or C. No dysuria.      Review of Systems  Constitutional: Negative for fever and fatigue.  Respiratory: Negative for cough, shortness of breath and wheezing.   Cardiovascular: Negative for palpitations and leg swelling.  Gastrointestinal: Positive for abdominal pain and abdominal distention. Negative for nausea, diarrhea, constipation, blood in stool and anal bleeding.  Genitourinary: Negative for dysuria, penile pain and testicular pain.       Objective:   Physical Exam  Constitutional:       obese  Cardiovascular: Normal rate, regular rhythm and intact distal pulses.  Exam reveals friction rub. Exam reveals no gallop.   No murmur heard. Pulmonary/Chest: Effort normal and breath sounds normal. No respiratory distress. He has no wheezes. He has no rales. He exhibits no tenderness.  Abdominal: Soft. Bowel sounds are normal. He exhibits distension. He exhibits no mass. There is no hepatosplenomegaly. There is tenderness in the right lower quadrant and left lower quadrant. There is no rebound, no guarding and no CVA tenderness.          Assessment & Plan:

## 2010-09-05 ENCOUNTER — Ambulatory Visit (INDEPENDENT_AMBULATORY_CARE_PROVIDER_SITE_OTHER): Payer: Medicare Other | Admitting: Family Medicine

## 2010-09-05 ENCOUNTER — Telehealth: Payer: Self-pay | Admitting: Radiology

## 2010-09-05 DIAGNOSIS — I4891 Unspecified atrial fibrillation: Secondary | ICD-10-CM

## 2010-09-05 DIAGNOSIS — Z7901 Long term (current) use of anticoagulants: Secondary | ICD-10-CM

## 2010-09-05 DIAGNOSIS — Z5181 Encounter for therapeutic drug level monitoring: Secondary | ICD-10-CM

## 2010-09-05 NOTE — Patient Instructions (Signed)
2.5 mg daily, 1.25 mg TUE /THURS recheck in 2 weeks

## 2010-09-05 NOTE — Telephone Encounter (Signed)
Patient was here today for INR check, wanted me to relay to you he is feeling better, not in as much pain. "Not well yet ,but better"

## 2010-09-08 ENCOUNTER — Telehealth: Payer: Self-pay | Admitting: *Deleted

## 2010-09-08 DIAGNOSIS — R1030 Lower abdominal pain, unspecified: Secondary | ICD-10-CM

## 2010-09-08 NOTE — Telephone Encounter (Signed)
Pt has been treated for diverticulitis and his wife states he is only marginally better.  He would like to get an MRI, wants to go to [facility].  Wife says you had discussed this with the patient.

## 2010-09-09 ENCOUNTER — Other Ambulatory Visit (INDEPENDENT_AMBULATORY_CARE_PROVIDER_SITE_OTHER): Payer: Medicare Other | Admitting: Family Medicine

## 2010-09-09 DIAGNOSIS — K5732 Diverticulitis of large intestine without perforation or abscess without bleeding: Secondary | ICD-10-CM

## 2010-09-09 LAB — CBC WITH DIFFERENTIAL/PLATELET
Basophils Relative: 0.7 % (ref 0.0–3.0)
Eosinophils Absolute: 0.1 10*3/uL (ref 0.0–0.7)
Eosinophils Relative: 1.4 % (ref 0.0–5.0)
Hemoglobin: 15.8 g/dL (ref 13.0–17.0)
Lymphocytes Relative: 22.8 % (ref 12.0–46.0)
MCHC: 33.4 g/dL (ref 30.0–36.0)
MCV: 95.7 fl (ref 78.0–100.0)
Neutro Abs: 5.6 10*3/uL (ref 1.4–7.7)
RBC: 4.93 Mil/uL (ref 4.22–5.81)
WBC: 8.9 10*3/uL (ref 4.5–10.5)

## 2010-09-09 LAB — BASIC METABOLIC PANEL
BUN: 24 mg/dL — ABNORMAL HIGH (ref 6–23)
Chloride: 104 mEq/L (ref 96–112)
Creatinine, Ser: 1.7 mg/dL — ABNORMAL HIGH (ref 0.4–1.5)
Glucose, Bld: 170 mg/dL — ABNORMAL HIGH (ref 70–99)
Potassium: 4.3 mEq/L (ref 3.5–5.1)

## 2010-09-09 NOTE — Telephone Encounter (Signed)
Not MRi.. CT scan abd pelvis to verify diverticulitis. Agreed if pt only marginally better on about day 5 of antibiotics.   [name] or [name]: I believe we need to get labs prior... If so order both cbc and BMET

## 2010-09-09 NOTE — Telephone Encounter (Signed)
appt made for labs and patient advised

## 2010-09-10 NOTE — Telephone Encounter (Signed)
CT at [facility] on 09/11/2010 at 2:15pm. MK Pt notified.

## 2010-09-11 ENCOUNTER — Ambulatory Visit (INDEPENDENT_AMBULATORY_CARE_PROVIDER_SITE_OTHER)
Admission: RE | Admit: 2010-09-11 | Discharge: 2010-09-11 | Disposition: A | Discharge status: Home / Self Care | Payer: Medicare Other | Source: Ambulatory Visit | Attending: Family Medicine | Admitting: Family Medicine

## 2010-09-11 ENCOUNTER — Other Ambulatory Visit: Payer: Self-pay | Admitting: *Deleted

## 2010-09-11 DIAGNOSIS — R1030 Lower abdominal pain, unspecified: Secondary | ICD-10-CM

## 2010-09-11 DIAGNOSIS — R109 Unspecified abdominal pain: Secondary | ICD-10-CM

## 2010-09-11 MED ORDER — IOHEXOL 300 MG/ML  SOLN
80.0000 mL | Freq: Once | INTRAMUSCULAR | Status: AC | PRN
  Administered 2010-09-11: 80 mL via INTRAVENOUS

## 2010-09-11 MED ORDER — METOPROLOL SUCCINATE ER 25 MG PO TB24: 25.0000 mg | ORAL_TABLET | Freq: Two times a day (BID) | ORAL | Status: DC

## 2010-09-12 ENCOUNTER — Telehealth: Payer: Self-pay | Admitting: *Deleted

## 2010-09-12 NOTE — Telephone Encounter (Signed)
Patient's wife called to get results from the scan done yesterday.  She was told to call today.

## 2010-09-17 ENCOUNTER — Encounter: Payer: Self-pay | Admitting: *Deleted

## 2010-09-18 ENCOUNTER — Telehealth: Payer: Self-pay | Admitting: Gastroenterology

## 2010-09-18 ENCOUNTER — Encounter: Payer: Self-pay | Admitting: Gastroenterology

## 2010-09-18 ENCOUNTER — Ambulatory Visit: Payer: Medicare Other | Admitting: Gastroenterology

## 2010-09-18 NOTE — Telephone Encounter (Signed)
Do not charge per Dr Jarold Motto

## 2010-09-19 ENCOUNTER — Ambulatory Visit: Payer: Medicare Other

## 2010-09-24 ENCOUNTER — Other Ambulatory Visit: Payer: Self-pay | Admitting: *Deleted

## 2010-09-24 MED ORDER — WARFARIN SODIUM 2.5 MG PO TABS: 2.5000 mg | ORAL_TABLET | Freq: Every day | ORAL | Status: DC

## 2010-09-24 NOTE — Telephone Encounter (Deleted)
Does this patient get both strengths?

## 2010-09-30 ENCOUNTER — Encounter (HOSPITAL_COMMUNITY)
Admission: RE | Admit: 2010-09-30 | Discharge: 2010-09-30 | Disposition: A | Discharge status: Home / Self Care | Payer: Medicare Other | Source: Ambulatory Visit | Attending: Orthopedic Surgery | Admitting: Orthopedic Surgery

## 2010-09-30 ENCOUNTER — Telehealth: Payer: Self-pay | Admitting: Cardiology

## 2010-09-30 LAB — URINALYSIS, ROUTINE W REFLEX MICROSCOPIC
Glucose, UA: NEGATIVE mg/dL
Hgb urine dipstick: NEGATIVE
Ketones, ur: NEGATIVE mg/dL
Protein, ur: NEGATIVE mg/dL
Urobilinogen, UA: 0.2 mg/dL (ref 0.0–1.0)

## 2010-09-30 LAB — DIFFERENTIAL
Basophils Relative: 1 % (ref 0–1)
Lymphocytes Relative: 26 % (ref 12–46)
Lymphs Abs: 2 10*3/uL (ref 0.7–4.0)
Monocytes Relative: 14 % — ABNORMAL HIGH (ref 3–12)
Neutro Abs: 4.4 10*3/uL (ref 1.7–7.7)
Neutrophils Relative %: 57 % (ref 43–77)

## 2010-09-30 LAB — CBC
Hemoglobin: 15.6 g/dL (ref 13.0–17.0)
MCH: 31.7 pg (ref 26.0–34.0)
MCV: 93.9 fL (ref 78.0–100.0)
RBC: 4.92 MIL/uL (ref 4.22–5.81)

## 2010-09-30 LAB — COMPREHENSIVE METABOLIC PANEL
Albumin: 3.9 g/dL (ref 3.5–5.2)
Alkaline Phosphatase: 45 U/L (ref 39–117)
BUN: 25 mg/dL — ABNORMAL HIGH (ref 6–23)
Creatinine, Ser: 1.6 mg/dL — ABNORMAL HIGH (ref 0.50–1.35)
GFR calc Af Amer: 52 mL/min — ABNORMAL LOW (ref >60)
Glucose, Bld: 137 mg/dL — ABNORMAL HIGH (ref 70–99)
Potassium: 5 mEq/L (ref 3.5–5.1)
Total Bilirubin: 0.5 mg/dL (ref 0.3–1.2)
Total Protein: 7.2 g/dL (ref 6.0–8.3)

## 2010-09-30 LAB — TYPE AND SCREEN
ABO/RH(D): O POS
Antibody Screen: NEGATIVE

## 2010-09-30 LAB — PROTIME-INR: Prothrombin Time: 14.3 seconds (ref 11.6–15.2)

## 2010-09-30 LAB — SURGICAL PCR SCREEN: Staphylococcus aureus: NEGATIVE

## 2010-09-30 NOTE — Telephone Encounter (Signed)
Faxed LOV, EKG, Stress & Echo. To Neysa Bonito (per Delice Bison) at [facility] ([phone]).

## 2010-10-01 ENCOUNTER — Ambulatory Visit (HOSPITAL_COMMUNITY)
Admission: RE | Admit: 2010-10-01 | Discharge: 2010-10-01 | DRG: 460 | Disposition: A | Discharge status: Home / Self Care | Payer: Medicare Other | Source: Ambulatory Visit | Attending: Orthopedic Surgery | Admitting: Orthopedic Surgery

## 2010-10-01 ENCOUNTER — Inpatient Hospital Stay (HOSPITAL_COMMUNITY): Payer: Medicare Other

## 2010-10-01 DIAGNOSIS — N289 Disorder of kidney and ureter, unspecified: Secondary | ICD-10-CM | POA: Insufficient documentation

## 2010-10-01 DIAGNOSIS — E669 Obesity, unspecified: Secondary | ICD-10-CM | POA: Insufficient documentation

## 2010-10-01 DIAGNOSIS — E119 Type 2 diabetes mellitus without complications: Secondary | ICD-10-CM | POA: Insufficient documentation

## 2010-10-01 DIAGNOSIS — I10 Essential (primary) hypertension: Secondary | ICD-10-CM | POA: Insufficient documentation

## 2010-10-01 DIAGNOSIS — Z7901 Long term (current) use of anticoagulants: Secondary | ICD-10-CM | POA: Insufficient documentation

## 2010-10-01 DIAGNOSIS — J45909 Unspecified asthma, uncomplicated: Secondary | ICD-10-CM | POA: Insufficient documentation

## 2010-10-01 DIAGNOSIS — K589 Irritable bowel syndrome without diarrhea: Secondary | ICD-10-CM | POA: Insufficient documentation

## 2010-10-01 DIAGNOSIS — M533 Sacrococcygeal disorders, not elsewhere classified: Secondary | ICD-10-CM | POA: Insufficient documentation

## 2010-10-01 DIAGNOSIS — I4891 Unspecified atrial fibrillation: Secondary | ICD-10-CM | POA: Insufficient documentation

## 2010-10-01 DIAGNOSIS — K219 Gastro-esophageal reflux disease without esophagitis: Secondary | ICD-10-CM | POA: Insufficient documentation

## 2010-10-01 DIAGNOSIS — M109 Gout, unspecified: Secondary | ICD-10-CM | POA: Insufficient documentation

## 2010-10-01 DIAGNOSIS — Z6835 Body mass index (BMI) 35.0-35.9, adult: Secondary | ICD-10-CM | POA: Insufficient documentation

## 2010-10-01 DIAGNOSIS — N1330 Unspecified hydronephrosis: Secondary | ICD-10-CM | POA: Insufficient documentation

## 2010-10-01 DIAGNOSIS — Z01812 Encounter for preprocedural laboratory examination: Secondary | ICD-10-CM | POA: Insufficient documentation

## 2010-10-01 LAB — GLUCOSE, CAPILLARY
Glucose-Capillary: 188 mg/dL — ABNORMAL HIGH (ref 70–99)
Glucose-Capillary: 177 mg/dL — ABNORMAL HIGH (ref 70–99)

## 2010-10-01 NOTE — Op Note (Signed)
[name], [name] NO.:  [id number]  MEDICAL RECORD NO.:  [id number]  LOCATION:  2899                         FACILITY:  [facility]  PHYSICIAN:  Jonathan Bamberg, MD      DATE OF BIRTH:  03-Jun-1940  DATE OF PROCEDURE:  10/01/2010 DATE OF DISCHARGE:                              OPERATIVE REPORT   PREOPERATIVE DIAGNOSIS:  Right-sided sacroiliac joint dysfunction.  POSTOPERATIVE DIAGNOSIS:  Right-sided sacroiliac joint dysfunction.  PROCEDURE: 1. Minimally invasive right-sided sacroiliac joint fusion using SI     bone titanium implants (55 mm, 45 mm, 40 mm). 2. Intraoperative use of fluoroscopy.  SURGEON:  Jonathan Bamberg, MD  ASSISTANT:  None.  ANESTHESIA:  General endotracheal anesthesia.  COMPLICATIONS:  None.  DISPOSITION:  Stable.  ESTIMATED BLOOD LOSS:  Minimal.  INDICATIONS FOR PROCEDURE:  Briefly, [name] is an extremely pleasant 70 year old male who presented to me with severe debilitating pain in the right side of his low back.  I did feel this was secondary to sacroiliac joint dysfunction and he did have a right-sided sacroiliac joint injection.  The patient did state that his pain improved nearly 100% after his injection.  After his failure of nonoperative care, we did have a discussion regarding going forward with a minimally invasive right-sided sacroiliac joint fusion.  The patient did agree to go forward with the procedure as noted above.  The patient fully understood the risks and limitations of the procedure as outlined in my preoperative note.  OPERATIVE DETAILS:  On October 01, 2010, the patient was brought to Surgery and general endotracheal anesthesia was administered.  The patient was placed prone on a well-padded Jackson flat spinal frame.  A bump was placed under the patient's chest and hips.  The right buttock was prepped and draped in usual sterile fashion.  2 g of Ancef were given preoperatively.  SCDs were placed and a  time-out procedure was performed.  The C-arm was then brought in and a lateral intraoperative view was obtained.  I did [name] out the posterior border of the sacrum as well as the sacral ala.  I then made a 3-cm incision in line with the posterior border of the sacrum, approximately 1 cm distal to the sacral ala.  I then placed a guide pin at approximately the posterior border of the sacrum, approximately 1 cm distal to the sacral ala.  I did liberally use inlet and outlet views in order to confirm the appropriate subjectory of the guidewire and the bite guidewire was advanced across the sacroiliac joint.  I then used a cannulated drill to drill across the SI joint and a broach was then advanced across the joint as well in the usual fashion.  I ultimately placed a 55-mm, 7-mm SI bone implant across the sacroiliac joint.  I then placed a guidewire immediately below the first implant, parallel to it.  It was advanced across the SI joint utilizing inlet and outlet views as previously described.  I then drilled across the joint and broached and I did place a 45-mm implant across the joint.  A third implant was placed below the second and the same implant was 40 mm in length.  All implants were 7 mm in diameter. I did note an excellent press fit of each of the implants.  Of note, second implant was advanced towards the S1 foramen, as per the manufacturer's recommendations.  It did not violate the foramen at any point throughout the procedure.  All the guidewires were removed and I did get lateral inlet and outlet views and I was very extremely pleased with the final appearance of the fluoroscopic views.  The wound was infiltrated with approximately 10 mL of Marcaine.  The fascia was then closed using #1 Vicryl.  The subcutaneous layer was closed using 2-0 Vicryl and the skin was closed using 3-0 Monocryl.  Benzoin and Steri- Strips were applied.  The patient was awoken from general  endotracheal anesthesia and transferred to the recovery room in stable condition. All instrument counts were correct at the termination of the procedure.     Jonathan Bamberg, MD     MD/MEDQ  D:  10/01/2010  T:  10/01/2010  Job:  [id number]  cc:   Jonathan Nora, MD  Electronically Signed by [name]  on 10/01/2010 12:25:42 PM

## 2010-10-02 ENCOUNTER — Ambulatory Visit: Payer: Self-pay | Admitting: Family Medicine

## 2010-10-14 ENCOUNTER — Encounter: Payer: Self-pay | Admitting: Gastroenterology

## 2010-10-14 ENCOUNTER — Ambulatory Visit (INDEPENDENT_AMBULATORY_CARE_PROVIDER_SITE_OTHER): Payer: Medicare Other | Admitting: Gastroenterology

## 2010-10-14 VITALS — BP 142/80 | HR 68 | Ht 74.0 in | Wt 310.0 lb

## 2010-10-14 DIAGNOSIS — K30 Functional dyspepsia: Secondary | ICD-10-CM | POA: Insufficient documentation

## 2010-10-14 DIAGNOSIS — R109 Unspecified abdominal pain: Secondary | ICD-10-CM | POA: Insufficient documentation

## 2010-10-14 DIAGNOSIS — E119 Type 2 diabetes mellitus without complications: Secondary | ICD-10-CM

## 2010-10-14 DIAGNOSIS — K219 Gastro-esophageal reflux disease without esophagitis: Secondary | ICD-10-CM | POA: Insufficient documentation

## 2010-10-14 DIAGNOSIS — K3189 Other diseases of stomach and duodenum: Secondary | ICD-10-CM

## 2010-10-14 DIAGNOSIS — E669 Obesity, unspecified: Secondary | ICD-10-CM | POA: Insufficient documentation

## 2010-10-14 DIAGNOSIS — K5730 Diverticulosis of large intestine without perforation or abscess without bleeding: Secondary | ICD-10-CM

## 2010-10-14 MED ORDER — RIFAXIMIN 550 MG PO TABS: 550.0000 mg | ORAL_TABLET | Freq: Two times a day (BID) | ORAL | Status: DC

## 2010-10-14 MED ORDER — ALIGN PO CAPS: ORAL_CAPSULE | ORAL | Status: DC

## 2010-10-14 NOTE — Progress Notes (Signed)
History of Present Illness:  This is a extremely complex 70 year old Caucasian male with insulin-dependent diabetes and morbid obesity associated hypertension, degenerative arthritis, chronic back pain requiring several operations, chronic anxiety and depression related to his obesity. His recent care has been complicated by an episodes of streptococcal sepsis or a prolonged hospitalization one year ago. His weight is very from 350-250 pounds, and he currently weighs 310 pounds and is extremely depressed about his weight. He has postprandial abdominal cramping and diarrhea of several hours after eating with associated diaphoresis and nausea. There's been some question in the past per gastroparesis, review of his emptying scan does not show this to be a major problem.  His main complaint is" unsettled feeling" his midabdominal area. He recently was treated for suspected diverticulitis although CT scan of the abdomen did not confirm diverticulitis or any other abnormalities. Does have a history of previous DVTs related again to his obesity, takes daily Coumadin. He denies melena, hematochezia, emesis, hepatobiliary complaints, or systemic complaints at this time. Is on multiple medications are listed and reviewed in his record. Additional meds include Klonopin, Prozac, Neurontin for peripheral neuropathy, when necessary hydrocodone, antihypertensive, and a sliding scale of insulin.  I have reviewed this patient's present history, medical and surgical past history, allergies and medications.   Past Medical History  Diagnosis Date  . Diverticulosis of colon (without mention of hemorrhage)   . Other pulmonary embolism and infarction   . Restless legs syndrome (RLS)   . Obesity, unspecified   . Irritable bowel syndrome   . Gastroparesis   . Unspecified essential hypertension   . Lumbago   . Depressive disorder, not elsewhere classified   . Anxiety state, unspecified   . Unspecified asthma   . Esophageal  reflux   . Atrial fibrillation   . Pure hypercholesterolemia   . Type II or unspecified type diabetes mellitus without mention of complication, not stated as uncontrolled   . Hemorrhoids    Past Surgical History  Procedure Date  . Back surgery     X 4   . Knee surgery     Bilateral x 6  . Artery repair     Left forearm  . Foot surgery     Left foot   . Replacement total knee     Left knee     reports that he has never smoked. He has never used smokeless tobacco. He reports that he does not drink alcohol or use illicit drugs. family history includes Alzheimer's disease in his sister; Breast cancer in his mother; Colon cancer (age of onset:75) in his brother; Emphysema in his father; Heart attack in his brother; Prostate cancer in his brother; and Uterine cancer in his mother. Allergies  Allergen Reactions  . Sulfacetamide Sodium     REACTION: Throat swelling       ROS: The remainder of the 10 point ROS is negative... he has limited exercise capacity because of his bad back which has required several neurosurgical procedures, also has morbid obesity and a history of multiple diets without success in terms of his weight loss. There is no history of previous hepatitis or pancreatitis, in fact liver function tests recently have been normal, and CT scan of the abdomen showed no specific abnormalities. Pancreas was visualized and was normal. The patient denies any specific food intolerances. He denies abuse of alcohol, cigarettes, or anti-inflammatories otherwise. He denies other cardiovascular or pulmonary difficulties at this time. There is a history of chronic headaches.  Physical Exam: General well developed well nourished patient in no acute distress, appearing his stated age. He has massive truncal obesity makes it hard for him to ambulate. Eyes PERRLA, no icterus, fundoscopic exam per opthamologist Skin no lesions noted Neck supple, no adenopathy, no thyroid enlargement, no  tenderness Chest clear to percussion and auscultation Heart no significant murmurs, gallops or rubs noted Abdomen no hepatosplenomegaly masses or tenderness, BS normal.  Extremities no acute joint lesions, edema, phlebitis or evidence of cellulitis. Stasis dermatitis and edema of the lower extremities noted. There are multiple areas of ecchymosis on his upper extremities. Neurologic patient oriented x 3, cranial nerves intact, no focal neurologic deficits noted. Psychological mental status normal and normal affect.  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 He has had midabdominal discomfort for over 5 years with several negative GI evaluations including endoscopies and colonoscopies. He does have symptomatic diverticulosis. I will again try him on Xifaxan 550 mg twice a day for 2 weeks with probiotic therapy and also instructions per a late dumping diet. His condition is seen in insulin-dependent diabetics with associated gut enteropathy. He seems to be a fairly reasonable candidate for bariatric surgery, and I have referred him to Dr. Luretha Murphy for evaluation. Currently this patient has marked anxiety and depression resolving around his inability to control his obesity. He certainly has multiple comorbid medical problems to justify bariatric surgery. He also seems to be very compliant, and intelligent.  Encounter Diagnoses  Name Primary?  . Obesity, diabetes, and hypertension syndrome Yes  . Abdominal pain, recurrent   . Diverticulosis of colon (without mention of hemorrhage)   . GERD (gastroesophageal reflux disease)   . Nonsurgical dumping syndrome

## 2010-10-14 NOTE — Patient Instructions (Signed)
Samples given of Xifaxan and Align. You need to call [facility] to speak to someone about the weight loss surgery. I have given you a handout from the website. [phone].   Rapid Gastric Emptying, Dumping Syndrome Rapid gastric emptying, or dumping syndrome, happens when the lower end of the small intestine (jejunum) fills too quickly with undigested food from the stomach. "Early" dumping begins during or right after a meal.  SYMPTOMS OF EARLY DUMPING INCLUDE:  Nausea  Bloating   Dizziness   Vomiting  Cramping   Diarrhea  Fatigue   SYMPTOMS OF LATE DUMPING INCLUDE:  Hypoglycemia (low blood sugar)  Weakness   Sweating  Dizziness   "Late" dumping happens 1 to 3 hours after eating. Many people have both types. CAUSES  Certain types of stomach surgery that allow the stomach to empty rapidly are the main cause of dumping syndrome.   Patients with Zollinger-Ellison syndrome may also have dumping syndrome. (Zollinger-Ellison syndrome is a rare disorder involving extreme peptic ulcer disease and gastrin-secreting tumors in the pancreas.)  DIAGNOSIS Doctors diagnose dumping syndrome primarily on the basis of symptoms in patients who have had gastric surgery that causes the syndrome. Tests may be needed to exclude other conditions that have similar symptoms. TREATMENT  Treatment includes changes in eating habits and medication.   People who have dumping syndrome need to eat several small meals a day that are low in carbohydrates and should drink liquids between meals, not with them.   People with severe cases may be prescribed medicine to slow their digestion.   Doctors may at times recommend surgery to help correct the problem.  Document Released: 12/20/2003 Document Re-Released: 07/16/2009 [facility] Patient Information 2011 [place], [place].

## 2010-10-16 ENCOUNTER — Ambulatory Visit (INDEPENDENT_AMBULATORY_CARE_PROVIDER_SITE_OTHER): Payer: Medicare Other | Admitting: Family Medicine

## 2010-10-16 DIAGNOSIS — I4891 Unspecified atrial fibrillation: Secondary | ICD-10-CM

## 2010-10-16 DIAGNOSIS — Z7901 Long term (current) use of anticoagulants: Secondary | ICD-10-CM

## 2010-10-16 DIAGNOSIS — Z5181 Encounter for therapeutic drug level monitoring: Secondary | ICD-10-CM

## 2010-10-16 LAB — POCT INR: INR: 2.8

## 2010-10-16 NOTE — Patient Instructions (Signed)
2.5 mg daily, 1.25 mg TUE /THURS, 7 days prior to procedure then resume current dose as directed after procedure. Recheck 2 weeks after restarting coumadin

## 2010-10-28 ENCOUNTER — Encounter: Payer: Self-pay | Admitting: Gastroenterology

## 2010-10-28 ENCOUNTER — Ambulatory Visit (INDEPENDENT_AMBULATORY_CARE_PROVIDER_SITE_OTHER): Payer: Medicare Other | Admitting: Gastroenterology

## 2010-10-28 VITALS — BP 132/64 | HR 60 | Ht 74.0 in | Wt 314.0 lb

## 2010-10-28 DIAGNOSIS — E119 Type 2 diabetes mellitus without complications: Secondary | ICD-10-CM

## 2010-10-28 DIAGNOSIS — K6389 Other specified diseases of intestine: Secondary | ICD-10-CM | POA: Insufficient documentation

## 2010-10-28 DIAGNOSIS — E669 Obesity, unspecified: Secondary | ICD-10-CM

## 2010-10-28 NOTE — Progress Notes (Signed)
History of Present Illness: This is a very complicated 70 year old Caucasian male with multiple cardiovascular problems complicated by insulin-dependent diabetes and chronic Coumadin administration. Because of recurrent nausea, distention, bloating, and excessive gas with associated diffuse abdominal pain, he was treated with 2 weeks of Xifaxan 550 mg twice a day with probiotic therapy. He currently is completely asymptomatic in terms of any GI complaints. He does take Prilosec 40 mg twice a day for acid reflux.    Current Medications, Allergies, Past Medical History, Past Surgical History, Family History and Social History were reviewed in [employer] record.   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 We will see how he does symptomatically in terms of intervals requiring treatment for this condition. Other possibilities would be to consider each bedtime Reglan 10 mg. He is on multiple medications otherwise listed and reviewed in his chart. I will see him on a when necessary basis as needed. Primary care physician is Dr. [name].

## 2010-10-28 NOTE — Progress Notes (Signed)
Addended by: Harlow Mares D on: 10/28/2010 01:52 PM   Modules accepted: Orders

## 2010-10-28 NOTE — Patient Instructions (Signed)
Call back as needed, if needed we can call in a rx of Xifaxan.

## 2010-10-30 ENCOUNTER — Telehealth: Payer: Self-pay | Admitting: *Deleted

## 2010-10-30 NOTE — Telephone Encounter (Signed)
Please print out and fax last five years of weights. Ideal BMI is between 20-25.[name] He is currently at 54...to get to upper  ideal BMI weight would have to drop to 180s!

## 2010-10-30 NOTE — Telephone Encounter (Signed)
Pt is going to bariatric clinic and they are asking for some information.  He needs to know the ideal BMI for a man of his size- 6'2" and 310 lbs.  He also needs his weights from the past 5 years.  Please call him with this information.

## 2010-10-31 NOTE — Telephone Encounter (Signed)
Left message advising patient information up front for pick up

## 2010-11-03 ENCOUNTER — Telehealth: Payer: Self-pay | Admitting: *Deleted

## 2010-11-03 NOTE — Telephone Encounter (Signed)
Patient advised and letter put up front to pick up

## 2010-11-03 NOTE — Telephone Encounter (Signed)
Patient calling saying they need a letter for the Bariatric surgeon for lapband stating patient would benefit from surgery as it would help him with his bp, diabetes and back pain. Patient would like this letter today b/c appointment is tomorrow. Please advise.

## 2010-11-03 NOTE — Telephone Encounter (Signed)
I made up a letter for [name]... But often bariatric places want specific weight loss, diet and exercise attemps listed... As he did not provide this for me I could not add it. I put this letter together for him with no info because it is wanted so fast..Make sure he knows that, if specific are needed.[name] He neeeds to list attempts for me so I can add to any furture letter.  Let me know if you have trouble printing and stamp letter with my sig. It should have been routed to you.

## 2010-11-06 ENCOUNTER — Encounter (HOSPITAL_COMMUNITY)
Admission: RE | Admit: 2010-11-06 | Discharge: 2010-11-06 | Disposition: A | Discharge status: Home / Self Care | Payer: Medicare Other | Source: Ambulatory Visit | Attending: Orthopedic Surgery | Admitting: Orthopedic Surgery

## 2010-11-06 LAB — URINALYSIS, ROUTINE W REFLEX MICROSCOPIC
Bilirubin Urine: NEGATIVE
Nitrite: NEGATIVE
Specific Gravity, Urine: 1.01 (ref 1.005–1.030)
Urobilinogen, UA: 0.2 mg/dL (ref 0.0–1.0)

## 2010-11-06 LAB — PROTIME-INR: Prothrombin Time: 30.2 seconds — ABNORMAL HIGH (ref 11.6–15.2)

## 2010-11-06 LAB — DIFFERENTIAL
Basophils Absolute: 0.2 10*3/uL — ABNORMAL HIGH (ref 0.0–0.1)
Eosinophils Absolute: 0.2 10*3/uL (ref 0.0–0.7)
Lymphocytes Relative: 24 % (ref 12–46)
Monocytes Absolute: 0.9 10*3/uL (ref 0.1–1.0)
Neutrophils Relative %: 60 % (ref 43–77)

## 2010-11-06 LAB — CBC
MCV: 91.4 fL (ref 78.0–100.0)
Platelets: 273 10*3/uL (ref 150–400)
RDW: 14.1 % (ref 11.5–15.5)
WBC: 7.7 10*3/uL (ref 4.0–10.5)

## 2010-11-06 LAB — COMPREHENSIVE METABOLIC PANEL
AST: 15 U/L (ref 0–37)
Albumin: 3.9 g/dL (ref 3.5–5.2)
Alkaline Phosphatase: 70 U/L (ref 39–117)
BUN: 26 mg/dL — ABNORMAL HIGH (ref 6–23)
Chloride: 100 mEq/L (ref 96–112)
Potassium: 4.2 mEq/L (ref 3.5–5.1)
Total Bilirubin: 0.4 mg/dL (ref 0.3–1.2)

## 2010-11-06 LAB — TYPE AND SCREEN: Antibody Screen: NEGATIVE

## 2010-11-06 LAB — SURGICAL PCR SCREEN: Staphylococcus aureus: POSITIVE — AB

## 2010-11-06 LAB — URINE MICROSCOPIC-ADD ON

## 2010-11-10 ENCOUNTER — Ambulatory Visit (INDEPENDENT_AMBULATORY_CARE_PROVIDER_SITE_OTHER): Payer: Medicare Other | Admitting: Family Medicine

## 2010-11-10 ENCOUNTER — Encounter: Payer: Self-pay | Admitting: Family Medicine

## 2010-11-10 DIAGNOSIS — E669 Obesity, unspecified: Secondary | ICD-10-CM

## 2010-11-10 DIAGNOSIS — E7800 Pure hypercholesterolemia, unspecified: Secondary | ICD-10-CM

## 2010-11-10 MED ORDER — ATORVASTATIN CALCIUM 80 MG PO TABS: 80.0000 mg | ORAL_TABLET | Freq: Every day | ORAL | Status: DC

## 2010-11-10 NOTE — Progress Notes (Signed)
  Subjective:    Patient ID: [name], male    DOB: 1941/01/18, 70 y.o.   MRN: [id number]  HPI   70 year old male presents interested in bariatric surgery.  He is also here to discuss cholesterol as well.   Recently has tried ALLI... This elevated blood sugars significantly.  Yesterdays diet:  Fairly typical B:Grits c butter , tomato slices, toast, coffee, equal L: Mac and cheese 4 oz, roll, 4 oz ham,tea with equal  7 PM D: cabbage, crystal light No dessert. No snacks. Went to bed 11:30. Portion size not an issues.   Exercise: Treadmill 4-5 times a week for 20-30 minutes. Hurts back some. Went for a walk yesterday.  Plans on joining [employer].   Elevated Cholesterol:poor control on simvastatin 80 Using medications without problems:none Muscle aches: yes Diet compliance:moderate Exercise: yes Other complaints:   Irregular bowel, looser stool improved dramatically with align from Dr. Jarold Motto.   Review of Systems  Constitutional: Positive for fatigue. Negative for fever.  HENT: Negative for ear pain.   Eyes: Negative for pain.  Respiratory: Negative for shortness of breath and wheezing.   Cardiovascular: Negative for chest pain, palpitations and leg swelling.  Gastrointestinal: Negative for abdominal pain.  Musculoskeletal: Positive for back pain.       Objective:   Physical Exam  Constitutional: Vital signs are normal. He appears well-developed and well-nourished.  HENT:  Head: Normocephalic.  Right Ear: Hearing normal.  Left Ear: Hearing normal.  Nose: Nose normal.  Mouth/Throat: Oropharynx is clear and moist and mucous membranes are normal.  Neck: Trachea normal. Carotid bruit is not present. No mass and no thyromegaly present.  Cardiovascular: Normal rate, regular rhythm and normal pulses.  Exam reveals no gallop, no distant heart sounds and no friction rub.   No murmur heard.      No peripheral edema  Pulmonary/Chest: Effort normal and breath sounds  normal. No respiratory distress.  Skin: Skin is warm, dry and intact. No rash noted.  Psychiatric: He has a normal mood and affect. His speech is normal and behavior is normal. Thought content normal.          Assessment & Plan:

## 2010-11-10 NOTE — Patient Instructions (Addendum)
Increase fish oil to 200-300 mg divided daily. Stop simvastatin.  Continue fenofibrate. Start atorvastain (lipitor) 80 mg daily. Make sure when labs done with Dr. Sharl Ma...that cholesterol checked in 3 months. Increase water in diet... 32-48 oz a day. Increase fruits and veggies (fiber), increase lean protein. Decrease carbohydrates. Continue portion control... 3 meals a day. Continue healthy snacks to stay full each day and avoid hunger. Consider trying weight watchers or [employer] Diet. Continue 3 meals a day.

## 2010-11-10 NOTE — Assessment & Plan Note (Signed)
Total visit time 30 minutes, > 50% spent counseling and cordinating patients care. See pt instructions for diet recommendations. He will return monthly for medically supervised weight loss program.

## 2010-11-10 NOTE — Assessment & Plan Note (Signed)
We will continue to manage here, but labs will be check with ENDO labs. Poor control trig and LDL not at goal . On simvastatin 80, which puts him higher risk for rhabdo. Could not afford crestor in past.  Will change to lipitor generic 80 mg daily, continue fenofibrate, increase fish oil. Recheck in 3 months.

## 2010-11-12 ENCOUNTER — Inpatient Hospital Stay (HOSPITAL_COMMUNITY)
Admission: RE | Admit: 2010-11-12 | Discharge: 2010-11-12 | DRG: 460 | Disposition: A | Discharge status: Home / Self Care | Payer: Medicare Other | Source: Ambulatory Visit | Attending: Orthopedic Surgery | Admitting: Orthopedic Surgery

## 2010-11-12 ENCOUNTER — Inpatient Hospital Stay (HOSPITAL_COMMUNITY): Payer: Medicare Other

## 2010-11-12 DIAGNOSIS — M2580 Other specified joint disorders, unspecified joint: Principal | ICD-10-CM | POA: Diagnosis present

## 2010-11-12 DIAGNOSIS — Z01812 Encounter for preprocedural laboratory examination: Secondary | ICD-10-CM

## 2010-11-12 LAB — PROTIME-INR: Prothrombin Time: 14.5 seconds (ref 11.6–15.2)

## 2010-11-12 LAB — APTT: aPTT: 31 seconds (ref 24–37)

## 2010-11-12 LAB — GLUCOSE, CAPILLARY

## 2010-11-17 NOTE — Op Note (Signed)
NAMEZAEL, SHUMAN NO.:  [id number]  MEDICAL RECORD NO.:  [id number]  LOCATION:  2899                         FACILITY:  [facility]  PHYSICIAN:  Estill Bamberg, MD      DATE OF BIRTH:  Sep 02, 1940  DATE OF PROCEDURE:  11/12/2010 DATE OF DISCHARGE:                              OPERATIVE REPORT   PREOPERATIVE DIAGNOSIS:  Left-sided sacroiliac joint dysfunction.  POSTOPERATIVE DIAGNOSIS:  Left-sided sacroiliac joint dysfunction.  PROCEDURE:  Left-sided sacroiliac joint fusion.  SURGEON:  Estill Bamberg, MD  ASSISTANT:  None.  ANESTHESIA:  General endotracheal anesthesia.  COMPLICATIONS:  None.  DISPOSITION:  Stable.  ESTIMATED BLOOD LOSS:  Minimal.  INDICATIONS FOR PROCEDURE:  Briefly, Mr. Birchall is a very pleasant 70- year-old male who initially presented to me with bilateral hip pain.  He did get significant improvement with bilateral sacroiliac joint injections.  We did ultimately go forward with the right-sided sacroiliac joint fusion and the patient did get significant relief from the procedure.  Given his significant relief, we did have a discussion regarding going forward with a left-sided sacroiliac joint fusion.  The patient fully understood the risks and limitations of the procedure as outlined in my preoperative note.  OPERATIVE DETAILS:  On November 12, 2010, the patient was brought to surgery and general endotracheal anesthesia was administered.  The patient was placed prone on a flat Jackson bed.  Gel pads were placed on the patient's chest and hips.  A time-out procedure was performed. Antibiotics were given and SCDs were placed.  The area of the left buttock was prepped and draped in the usual sterile fashion.  The C-arm was draped and lateral fluoroscopic view was obtained.  I did Nevyn Bossman out the posterior border of the sacrum and the sacral ala.  A 4-cm incision was made in line with the posterior border of the sacrum.  I then used  a guidewire which was advanced across the sacroiliac joint.  Both lateral inlet and outlet views were liberally obtained in order to confirm the appropriate trajectory of the guidewire.  I was very happy with the trajectory of the initial guidewire as it was noted to be within the sacrum.  I then used a cannulated drill to drill across the sacroiliac joint.  A broach was used and a 7-mm titanium implant of the appropriate length was advanced across the sacroiliac joint.  I then placed a second guidewire parallel to the first just beneath the first guidewire. Again, the guidewire was advanced.  The guidewire did approach the S1 foramen, but did not traverse it.  I again used a cannulated drill and broach and a 7-mm implant of the appropriate length was advanced.  The implant was advanced up to the lateral border of the neural foramen.  I did note an excellent press fit.  I then placed the third implant in the manner described previously for the last two.  I again liberally used the lateral inlet and outlet views and a 7-mm implant of the appropriate length was advanced across the sacroiliac joint.  I did obtain lateral inlet and outlet final views and was very happy with the final appearance on the fluoroscopic views.  The wound was then copiously irrigated.  The fascia was closed using 0 Vicryl, the subcutaneous layer was closed using 2-0 Vicryl, and the skin was closed using 3-0 Monocryl. Benzoin and Steri-Strips were applied followed by sterile dressing.  The patient was then awoken from general endotracheal anesthesia and transferred to recovery in stable condition.  All instrument counts were correct at the termination of the procedure.     Estill Bamberg, MD     MD/MEDQ  D:  11/12/2010  T:  11/12/2010  Job:  [id number]  Electronically Signed by Estill Bamberg  on 11/17/2010 10:41:48 AM

## 2010-11-27 ENCOUNTER — Ambulatory Visit: Payer: Medicare Other

## 2010-12-01 ENCOUNTER — Ambulatory Visit: Payer: Medicare Other

## 2010-12-02 ENCOUNTER — Encounter (INDEPENDENT_AMBULATORY_CARE_PROVIDER_SITE_OTHER): Payer: Self-pay | Admitting: Surgery

## 2010-12-02 ENCOUNTER — Ambulatory Visit (INDEPENDENT_AMBULATORY_CARE_PROVIDER_SITE_OTHER): Payer: Medicare Other | Admitting: Surgery

## 2010-12-02 VITALS — BP 127/88 | HR 78 | Temp 97.0°F | Resp 20 | Ht 75.0 in | Wt 310.8 lb

## 2010-12-02 DIAGNOSIS — E669 Obesity, unspecified: Secondary | ICD-10-CM

## 2010-12-02 NOTE — Progress Notes (Signed)
Subjective:     Patient ID: [name], male   DOB: 02-09-1941, 70 y.o.   MRN: [id number]  HPI the patient and his wife came in today for evaluation and discussion for gastric bypass. He has been dealing with metabolic syndrome for some time and has had diabetes mellitus for greater than 5 years. He is followed by Dr. Jacquiline Doe at [facility].  He said despite is 70 years he really wants to try to get his wife back. He seems very frustrated by the number of medications he has to take every day. He spoke with Dr. Sheryn Bison who recommended him for Roux-en-Y gastric bypass. He is familiar with the procedure and I discussed with him further. He and his wife want to proceed down the workup pathway.  He has had multiple comorbid problems including asthma, a clotting disorder, atrial fibrillation, diabetes mellitus, high cholesterol, hypertension. The following is his problem this  Patient Active Problem List  Diagnoses  . HERPES ZOSTER W/NERVOUS COMPLICATION NEC  . DM  . HYPERCHOLESTEROLEMIA  . UNSPECIFIED ANEMIA  . ANXIETY  . DEPRESSION  . RESTLESS LEG SYNDROME  . HYPERTENSION  . ATRIAL FIBRILLATION, PAROXYSMAL  . ALLERGIC RHINITIS CAUSE UNSPECIFIED  . OTHER DISEASES OF NASAL CAVITY AND SINUSES  . ASTHMA, PERSISTENT, MILD  . GASTROPARESIS  . DIVERTICULOSIS, COLON  . IRRITABLE BOWEL SYNDROME  . RENAL INSUFFICIENCY  . HYDRONEPHROSIS, RIGHT  . LOW BACK PAIN, CHRONIC  . OSTEOPOROSIS, DRUG-INDUCED  . FOOT SURGERY, HX OF  . KNEE REPLACEMENT, LEFT, HX OF  . Acute venous embolism and thrombosis of deep vessels of proximal lower extremity  . Diverticulitis of colon  . Obesity, diabetes, and hypertension syndrome  . Abdominal pain, recurrent  . GERD (gastroesophageal reflux disease)  . Nonsurgical dumping syndrome  . DM (diabetes mellitus)  . Bacterial overgrowth syndrome  . Obesity   Allergies  Allergen Reactions  . Oxycodone   . Sulfacetamide Sodium     REACTION:  Throat swelling   Current Outpatient Prescriptions  Medication Sig Dispense Refill  . alclomethasone (ACLOVATE) 0.05 % cream       . atorvastatin (LIPITOR) 80 MG tablet Take 1 tablet (80 mg total) by mouth daily.  90 tablet  3  . b complex-C-folic acid 1 MG capsule Take 1 capsule by mouth daily.        . bifidobacterium infantis (ALIGN) capsule Take one tablet by mouth once a day while on Xifaxan.  14 capsule  0  . clonazePAM (KLONOPIN) 0.5 MG tablet Take 0.5 mg by mouth. 1 tab by mouth every 8 hours as needed for agitation       . cyclobenzaprine (FLEXERIL) 10 MG tablet       . diazepam (VALIUM) 5 MG tablet       . doxycycline (VIBRAMYCIN) 100 MG capsule Take 100 mg by mouth 2 (two) times daily.        . fenofibrate 160 MG tablet Take 1 tablet (160 mg total) by mouth daily.  90 tablet  3  . ferrous sulfate 325 (65 FE) MG tablet Take 325 mg by mouth. Take one tablet twice daily       . FLUoxetine (PROZAC) 40 MG capsule Take 1 capsule (40 mg total) by mouth daily.  90 capsule  3  . furosemide (LASIX) 20 MG tablet 1 tab po daily prn peripheral swelling  90 tablet  3  . gabapentin (NEURONTIN) 300 MG capsule       .  Glucose Blood (BAYER BREEZE 2 TEST) DISK by In Vitro route. Check blood sugar three times a day       . HYDROcodone-acetaminophen (NORCO) 10-325 MG per tablet Take 1 tablet by mouth. 1-2 every 4-6 hours as needed MAX acetaminophen is 4000 mg daily       . insulin aspart (NOVOLOG FLEXPEN) 100 UNIT/ML injection Inject into the skin. 9 units with every meal       . insulin detemir (LEVEMIR) 100 UNIT/ML injection Inject into the skin. 30 units in am once a day       . Insulin Syringe-Needle U-100 (BD INSULIN SYRINGE ULTRAFINE) 31G X 5/16" 0.5 ML MISC by Does not apply route. Inject insulin four times a day       . Lancets MISC by Does not apply route. Check blood sugar 3 times a day       . metoprolol succinate (TOPROL-XL) 25 MG 24 hr tablet Take 1 tablet (25 mg total) by mouth 2 (two)  times daily.  90 tablet  3  . omeprazole (PRILOSEC) 40 MG capsule Take 1 capsule (40 mg total) by mouth 2 (two) times daily.  180 capsule  3  . oxyCODONE-acetaminophen (PERCOCET) 5-325 MG per tablet       . VIGAMOX 0.5 % ophthalmic solution       . warfarin (COUMADIN) 2.5 MG tablet Take 1 tablet (2.5 mg total) by mouth daily. As directed   90 tablet  3   Past Surgical History  Procedure Date  . Back surgery     X 4   . Knee surgery     Bilateral x 6  . Artery repair     Left forearm  . Foot surgery     Left foot   . Replacement total knee     Left knee    Past Medical History  Diagnosis Date  . Diverticulosis of colon (without mention of hemorrhage)   . Other pulmonary embolism and infarction   . Restless legs syndrome (RLS)   . Obesity, unspecified   . Irritable bowel syndrome   . Gastroparesis   . Unspecified essential hypertension   . Lumbago   . Depressive disorder, not elsewhere classified   . Anxiety state, unspecified   . Unspecified asthma   . Esophageal reflux   . Atrial fibrillation   . Pure hypercholesterolemia   . Type II or unspecified type diabetes mellitus without mention of complication, not stated as uncontrolled   . Hemorrhoids       Review of Systems  Constitutional: Negative for activity change and fatigue.  Psychiatric/Behavioral: Negative.        Objective:   Physical Exam  Constitutional: He is oriented to person, place, and time. He appears well-nourished.  HENT:  Head: Normocephalic and atraumatic.  Eyes: Conjunctivae are normal. Pupils are equal, round, and reactive to light.  Neck: Normal range of motion. Neck supple.       No bruits  Cardiovascular: Normal rate, regular rhythm and normal heart sounds.   Pulmonary/Chest: Effort normal and breath sounds normal.  Abdominal: Soft. Bowel sounds are normal.       Large pannus   Musculoskeletal: Normal range of motion.  Neurological: He is alert and oriented to person, place, and  time.  Skin: Skin is warm and dry.  Psychiatric: He has a normal mood and affect. His behavior is normal. Judgment and thought content normal.       Assessment:  70 year old patient with metabolic syndrome and desires gasric bypass.      Plan:     Begin workup for gastric bypass

## 2010-12-03 ENCOUNTER — Other Ambulatory Visit (INDEPENDENT_AMBULATORY_CARE_PROVIDER_SITE_OTHER): Payer: Self-pay | Admitting: Surgery

## 2010-12-03 ENCOUNTER — Other Ambulatory Visit (INDEPENDENT_AMBULATORY_CARE_PROVIDER_SITE_OTHER): Payer: Self-pay | Admitting: General Surgery

## 2010-12-03 DIAGNOSIS — K229 Disease of esophagus, unspecified: Secondary | ICD-10-CM

## 2010-12-03 DIAGNOSIS — E119 Type 2 diabetes mellitus without complications: Secondary | ICD-10-CM

## 2010-12-03 DIAGNOSIS — I10 Essential (primary) hypertension: Secondary | ICD-10-CM

## 2010-12-04 ENCOUNTER — Ambulatory Visit (INDEPENDENT_AMBULATORY_CARE_PROVIDER_SITE_OTHER): Payer: Medicare Other | Admitting: Surgery

## 2010-12-05 NOTE — Discharge Summary (Signed)
  NAMELENWOOD, BALSAM NO.:  [id number]  MEDICAL RECORD NO.:  [id number]  LOCATION:  2899                         FACILITY:  [facility]  PHYSICIAN:  Estill Bamberg, MD      DATE OF BIRTH:  01-30-1941  DATE OF ADMISSION:  11/12/2010 DATE OF DISCHARGE:  11/12/2010                              DISCHARGE SUMMARY   ADMISSION DIAGNOSIS:  Left-sided sacroiliac joint dysfunction.  DISCHARGE DIAGNOSIS:  Left-sided sacroiliac joint dysfunction.  ADMISSION HISTORY:  Briefly, Mr. Novick is a 70 year old male who presented to my office with severe debilitating pain in the left sacroiliac joint region.  He did go forward to the right-sided SI fusion and did get improvement and wish to go forward with a left SI fusion and he was admitted on September 12, 2010, for this procedure.  HOSPITAL COURSE:  On November 12, 2010, the patient underwent a minimally invasive left-sided sacroiliac joint fusion.  The patient tolerated the procedure well and was observed in the recovery period for approximately 1 hour.  The patient was uneventfully discharged home in the same day. The patient took Valium for spasms and Percocet for pain.     Estill Bamberg, MD     MD/MEDQ  D:  11/27/2010  T:  11/28/2010  Job:  [id number]  Electronically Signed by Estill Bamberg  on 12/05/2010 05:33:32 PM

## 2010-12-08 ENCOUNTER — Ambulatory Visit (INDEPENDENT_AMBULATORY_CARE_PROVIDER_SITE_OTHER): Payer: Medicare Other | Admitting: Family Medicine

## 2010-12-08 ENCOUNTER — Encounter: Payer: Self-pay | Admitting: Family Medicine

## 2010-12-08 ENCOUNTER — Ambulatory Visit: Payer: Medicare Other

## 2010-12-08 VITALS — BP 114/72 | HR 84 | Temp 97.6°F | Wt 303.0 lb

## 2010-12-08 DIAGNOSIS — I4891 Unspecified atrial fibrillation: Secondary | ICD-10-CM

## 2010-12-08 DIAGNOSIS — R0600 Dyspnea, unspecified: Secondary | ICD-10-CM

## 2010-12-08 DIAGNOSIS — R0609 Other forms of dyspnea: Secondary | ICD-10-CM

## 2010-12-08 DIAGNOSIS — Z5181 Encounter for therapeutic drug level monitoring: Secondary | ICD-10-CM

## 2010-12-08 DIAGNOSIS — R0602 Shortness of breath: Secondary | ICD-10-CM | POA: Insufficient documentation

## 2010-12-08 DIAGNOSIS — I824Y9 Acute embolism and thrombosis of unspecified deep veins of unspecified proximal lower extremity: Secondary | ICD-10-CM

## 2010-12-08 DIAGNOSIS — Z7901 Long term (current) use of anticoagulants: Secondary | ICD-10-CM

## 2010-12-08 LAB — POCT INR: INR: 3.1

## 2010-12-08 MED ORDER — ALBUTEROL SULFATE (2.5 MG/3ML) 0.083% IN NEBU
2.5000 mg | INHALATION_SOLUTION | Freq: Once | RESPIRATORY_TRACT | Status: AC
  Administered 2010-12-08: 2.5 mg via RESPIRATORY_TRACT

## 2010-12-08 MED ORDER — ALBUTEROL SULFATE (2.5 MG/3ML) 0.083% IN NEBU: 2.5000 mg | INHALATION_SOLUTION | Freq: Once | RESPIRATORY_TRACT | Status: DC

## 2010-12-08 MED ORDER — ALBUTEROL SULFATE (2.5 MG/3ML) 0.083% IN NEBU: 2.5000 mg | INHALATION_SOLUTION | Freq: Four times a day (QID) | RESPIRATORY_TRACT | Status: DC | PRN

## 2010-12-08 MED ORDER — IPRATROPIUM BROMIDE 0.02 % IN SOLN
0.5000 mg | Freq: Once | RESPIRATORY_TRACT | Status: AC
  Administered 2010-12-08: 0.5 mg via RESPIRATORY_TRACT

## 2010-12-08 MED ORDER — PREDNISONE 50 MG PO TABS: 50.0000 mg | ORAL_TABLET | Freq: Every day | ORAL | Status: DC

## 2010-12-08 NOTE — Assessment & Plan Note (Addendum)
Several week h/o dyspnea, recently worsened last 4 days after mowing lawn. Anticipate asthma exacerbation as recent flare. No evidence of infection, no fever, no viral urti sxs. INR today of 3.1 points against PE. H/o afib but doubt cardiac, no crackles on lung exam, no significant pitting edema. Treat as asthma exac with alb/atrovent neb today and then start prednisone course. If not improved, refer back to cards for re eval of afib, as may be due.

## 2010-12-08 NOTE — Progress Notes (Signed)
  Subjective:    Patient ID: [name], male    DOB: 23-Nov-1940, 70 y.o.   MRN: [id number]  HPI CC: cough, congestion  70yo with h/o PE on coumadin, Afib, HTN, T2DM presents with cough/congestion.  For last 4 weeks having increasing congestion, wheezing at night, cough, chest tightness, SOB.  Dry occasional cough.  Last night trouble with continued wheezing with breathing.  This all started after back surgery for sacroiliitis early 11/2010 (Dr. Yevette Edwards at [facility] ortho).  Not feeling ill, just trouble with breathing.  Main complaint is wheezing and trouble breathing.  H/o asthma.  Not active recently (in 20 years).  Has not needed oral steroids in years.  No leg swelling, chest pain.  No fevers/chills, abd pain, n/v, HA, dizziness.  No sneezing, RN, ST, significant nasal congestion.  Wife endorses acute worsening of breathing after mowed lawn over weekend.  Wife is treating with mucinex bid, started albuterol nebulizer on Friday.  Hasn't really helped, only causing worsening tremor.    Wt Readings from Last 3 Encounters:  12/08/10 303 lb (137.44 kg)  12/02/10 310 lb 12.8 oz (140.978 kg)  11/10/10 308 lb (139.708 kg)   last INR was today, 3.1.  No sick contacts at home, no smokers at home.  Review of Systems Per HPI    Objective:   Physical Exam  Nursing note and vitals reviewed. Constitutional: He appears well-developed and well-nourished. No distress.       Tired, nontoxic  HENT:  Head: Normocephalic and atraumatic.  Mouth/Throat: Oropharynx is clear and moist. No oropharyngeal exudate.  Eyes: Conjunctivae and EOM are normal. Pupils are equal, round, and reactive to light. No scleral icterus.  Neck: Normal range of motion. Neck supple.  Cardiovascular: Normal rate.  An irregularly irregular rhythm present.  No murmur heard. Pulmonary/Chest: Not tachypneic. No respiratory distress. He has no decreased breath sounds. He has wheezes (mild-mod exp wheezing upper lobes). He  has no rhonchi. He has no rales.       No crackles  Musculoskeletal: He exhibits no edema.  Lymphadenopathy:    He has no cervical adenopathy.  Skin: Skin is warm and dry. No rash noted.  Psychiatric: He has a normal mood and affect.      Assessment & Plan:

## 2010-12-08 NOTE — Patient Instructions (Signed)
Continue current dose, check in 4 weeks  

## 2010-12-08 NOTE — Patient Instructions (Signed)
I think this is asthma exacerbation. Treat with prednisone course, continue albuterol nebulizer at home regularly for next 2-3 days then as needed. Use mask when mowing lawn. Please return if not improving as expected. If worsening cough or shortness of breath, please seek urgent medical care.

## 2010-12-10 ENCOUNTER — Telehealth: Payer: Self-pay | Admitting: *Deleted

## 2010-12-10 MED ORDER — LEVALBUTEROL HCL 0.63 MG/3ML IN NEBU: 1.0000 | INHALATION_SOLUTION | Freq: Four times a day (QID) | RESPIRATORY_TRACT | Status: DC | PRN

## 2010-12-10 NOTE — Telephone Encounter (Signed)
Spoke with patient. He said his breathing was doing better. I advised of new Rx at the pharmacy. He said he would try that and see how he did. Advised him to call if he had any problems.

## 2010-12-10 NOTE — Telephone Encounter (Signed)
How is breathing doing?  May start xopenex instead of albuterol (hopefully less jittery/tremor with this)

## 2010-12-10 NOTE — Telephone Encounter (Signed)
Pt was seen days ago and prescribed albuterol for his nebulizer. Has had rxn of R sided trembling every time he uses albuterol. Wife has d/c med and wants to know if you would prescribe something else.

## 2010-12-11 ENCOUNTER — Ambulatory Visit: Payer: Medicare Other | Admitting: *Deleted

## 2010-12-11 ENCOUNTER — Ambulatory Visit (INDEPENDENT_AMBULATORY_CARE_PROVIDER_SITE_OTHER): Payer: Medicare Other | Admitting: Family Medicine

## 2010-12-11 ENCOUNTER — Encounter: Payer: Self-pay | Admitting: Family Medicine

## 2010-12-11 DIAGNOSIS — R259 Unspecified abnormal involuntary movements: Secondary | ICD-10-CM

## 2010-12-11 DIAGNOSIS — R251 Tremor, unspecified: Secondary | ICD-10-CM

## 2010-12-11 DIAGNOSIS — M545 Low back pain: Secondary | ICD-10-CM

## 2010-12-11 DIAGNOSIS — R0600 Dyspnea, unspecified: Secondary | ICD-10-CM

## 2010-12-11 DIAGNOSIS — I4891 Unspecified atrial fibrillation: Secondary | ICD-10-CM

## 2010-12-11 DIAGNOSIS — Z0000 Encounter for general adult medical examination without abnormal findings: Secondary | ICD-10-CM

## 2010-12-11 DIAGNOSIS — I10 Essential (primary) hypertension: Secondary | ICD-10-CM

## 2010-12-11 DIAGNOSIS — E119 Type 2 diabetes mellitus without complications: Secondary | ICD-10-CM

## 2010-12-11 DIAGNOSIS — R0609 Other forms of dyspnea: Secondary | ICD-10-CM

## 2010-12-11 NOTE — Assessment & Plan Note (Signed)
Well controlled. Continue current medication.  

## 2010-12-11 NOTE — Assessment & Plan Note (Signed)
Per endo °

## 2010-12-11 NOTE — Progress Notes (Signed)
Subjective:    Patient ID: [name], male    DOB: 1940-04-13, 70 y.o.   MRN: [id number]  HPI   Complicated 70 year old male presents for weight loss check and Annual Medicare Wellness. I have personally reviewed the Medicare Annual Wellness questionnaire and have noted 1. The patient's medical and social history 2. Their use of alcohol, tobacco or illicit drugs 3. Their current medications and supplements 4. The patient's functional ability including ADL's, fall risks, home safety risks and hearing or visual             impairment. 5. Diet and physical activities 6. Evidence for depression or mood disorders The patients weight, height, BMI and visual acuity have been recorded in the chart I have made referrals, counseling and provided education to the patient based review of the above and I have provided the pt with a written personalized care plan for preventive services.   DM, managed by endo. Recent increase in FBS with steroids, but better control overall.  Multiple GI issues (bacterial overgrowth syndrome, nonsurgical dumping, IBS, gastroparesis) managed by Dr. Jarold Motto. Stable eval in 10/28/2010.  Hypertension:  At goal on current meds.  Using medication without problems or lightheadedness: Yes Chest pain with exertion: None Edema:None Short of breath: Average home WUJ:[id number].  Other issues:  Elevated Cholesterol: Placed on lipitor 80 mg daily for poor control on 10/1... Due for re-eval in 02/2011 Using medications without problems: None Muscle aches: None Diet compliance:  Exercise: On treadmill daily 3/4 mile... Limited due to recent back surgery. Other complaints:  Chronic back pain: recent surgery by  Dr. Yevette Edwards. He reports he is gradaully improving with pain.  Seen 10/29 saw Dr. Patrice Paradise for dyspnea: Felt asthma exacerbation. Given albuterol started prednisone.  per pt improving SOB at this point.  Still some congestion  Noted 2 hours after taking  prednisone and albuterol.. Tremor, severe... On right side. Had trouble eating. Lasted 3-4 hours...improved now. No weakness, no numbness, no slurred speech, no confusion. Stopped albuterol since then.. xopenex has been called in.[name] Has not tried yet. He has some baseline tremor.. Worse in last year.   Uro :nml eval on 10/23, continuing rapaflo per Dr. Brunilda Payor.  Has seen Dr. Daphine Deutscher in 11/2010 for bariatric surgery consult.     Review of Systems  Constitutional: Negative for fever and fatigue.  HENT: Negative for ear pain.   Eyes: Negative for pain.  Respiratory: Positive for cough, shortness of breath and wheezing.   Cardiovascular: Negative for chest pain, palpitations and leg swelling.  Gastrointestinal: Negative for abdominal pain, constipation, blood in stool and abdominal distention.  Genitourinary: Negative for dysuria.  Musculoskeletal: Positive for back pain.  Neurological: Positive for tremors. Negative for dizziness, seizures, syncope, speech difficulty, weakness, light-headedness and numbness.       Objective:   Physical Exam  Constitutional: He is oriented to person, place, and time. He appears well-developed and well-nourished.       Morbid obesity  Pulmonary/Chest: Effort normal and breath sounds normal. No respiratory distress. He has no wheezes. He has no rales.  Abdominal: Soft. He exhibits no distension and no mass. There is no tenderness. There is no rebound and no guarding.  Genitourinary:       Per Dr. Brunilda Payor  Musculoskeletal: Normal range of motion.  Neurological: He is alert and oriented to person, place, and time. He has normal strength and normal reflexes. He displays tremor. He displays no atrophy and normal reflexes. No cranial nerve  deficit or sensory deficit. He exhibits normal muscle tone. He displays a negative Romberg sign. Coordination and gait normal.       Tremor in head, hands. Nml cerebellar exam, except mild intention tremor with finger nose.  Skin:  Skin is warm and dry.  Psychiatric: He has a normal mood and affect. His behavior is normal. Judgment and thought content normal.          Assessment & Plan:  Annual Medicare Wellness: The patient's preventative maintenance and recommended screening tests for an annual wellness exam were reviewed in full today. Brought up to date unless services declined.  Counselled on the importance of diet, exercise, and its role in overall health and mortality. The patient's FH and SH was reviewed, including their home life, tobacco status, and drug and alcohol status.   Vaccines: Td, PNA uptodate. Shingles: will consider PSA/rectal: 1.31 checked with Dr. Brunilda Payor... On rapaflo for urinary symptoms. Colonoscopy: 2008 repeat in 10 years

## 2010-12-11 NOTE — Assessment & Plan Note (Signed)
Recent surgery, healing well, getting back to activity.

## 2010-12-11 NOTE — Assessment & Plan Note (Signed)
Likely asthma exacerbation... Improving with prednisone.

## 2010-12-11 NOTE — Patient Instructions (Addendum)
Trial of xopenex for cough, wheeze. You have some baseline tremor.. Likely benign essential tremor. Strengthening exercises, gradually increase as tolerated the length of time of exercise. Bike good idea. Consider shingles vaccine (ask insurance about coverage). Return for flu vaccine when ready.  Look into setting up living will.

## 2010-12-11 NOTE — Assessment & Plan Note (Signed)
Has likely benign essential tremor... Possibly worsened by albuterol.. Trial of xopenex.  No neuro changes. Follow up in 1 month.

## 2010-12-11 NOTE — Assessment & Plan Note (Signed)
Stable , euvolemic. On coumadin for anticoagulant.

## 2010-12-15 ENCOUNTER — Encounter: Payer: Self-pay | Admitting: *Deleted

## 2010-12-15 ENCOUNTER — Encounter: Payer: Medicare Other | Attending: Surgery | Admitting: *Deleted

## 2010-12-15 DIAGNOSIS — Z713 Dietary counseling and surveillance: Secondary | ICD-10-CM | POA: Insufficient documentation

## 2010-12-15 DIAGNOSIS — Z01818 Encounter for other preprocedural examination: Secondary | ICD-10-CM | POA: Insufficient documentation

## 2010-12-15 NOTE — Patient Instructions (Addendum)
   Follow Pre-Op Nutrition Goals to prepare for Gastric Bypass Surgery.   Call the [facility] at [phone] once you have been given your surgery date to enrolled in the Pre-Op Nutrition Class. You will need to attend this nutrition class 3-4 weeks prior to your surgery. 

## 2010-12-15 NOTE — Progress Notes (Signed)
  Pre-Op Assessment Visit: Pre-Operative Gastric Bypass Surgery  Medical Nutrition Therapy:  Appt start time: 1000 end time:  1100.  Patient was seen on 12/15/2010 for Pre-Operative Gastric Bypass Nutrition Assessment. Assessment and letter of approval faxed to [facility] Bariatric Surgery Program coordinator on 12/15/2010.  Approval letter sent to [facility] Scan center and will be available in the chart under the media tab.  Handouts given during visit include:  Pre-Op Goals Handout  Patient to call for Pre-Op and Post-Op Nutrition Education at the [facility] when surgery is scheduled.

## 2010-12-19 ENCOUNTER — Other Ambulatory Visit: Payer: Self-pay | Admitting: Internal Medicine

## 2010-12-19 MED ORDER — WARFARIN SODIUM 2.5 MG PO TABS: 2.5000 mg | ORAL_TABLET | Freq: Every day | ORAL | Status: DC

## 2010-12-22 ENCOUNTER — Telehealth (HOSPITAL_BASED_OUTPATIENT_CLINIC_OR_DEPARTMENT_OTHER): Payer: Self-pay | Admitting: Radiology

## 2010-12-22 ENCOUNTER — Ambulatory Visit (HOSPITAL_BASED_OUTPATIENT_CLINIC_OR_DEPARTMENT_OTHER): Payer: Medicare Other

## 2010-12-26 ENCOUNTER — Ambulatory Visit (INDEPENDENT_AMBULATORY_CARE_PROVIDER_SITE_OTHER): Payer: Medicare Other | Admitting: Family Medicine

## 2010-12-26 ENCOUNTER — Other Ambulatory Visit: Payer: Self-pay | Admitting: *Deleted

## 2010-12-26 ENCOUNTER — Encounter: Payer: Self-pay | Admitting: Family Medicine

## 2010-12-26 VITALS — BP 112/72 | HR 75 | Temp 98.1°F | Ht 75.0 in | Wt 306.4 lb

## 2010-12-26 DIAGNOSIS — J45901 Unspecified asthma with (acute) exacerbation: Secondary | ICD-10-CM

## 2010-12-26 MED ORDER — PREDNISONE 20 MG PO TABS: ORAL_TABLET | ORAL | Status: DC

## 2010-12-26 MED ORDER — GUAIFENESIN-CODEINE 100-10 MG/5ML PO SYRP: 10.0000 mL | ORAL_SOLUTION | Freq: Every evening | ORAL | Status: AC | PRN

## 2010-12-26 NOTE — Assessment & Plan Note (Signed)
Likely viral infection, vs allergies.   Treat symptomatically as in pt instructions. Start prednisone if progresses over weekend as usual.

## 2010-12-26 NOTE — Progress Notes (Signed)
  Subjective:    Patient ID: [name], male    DOB: [birth year], 70 y.o.   MRN: [id number]  Cough Pertinent negatives include no chest pain, ear pain or fever.    70 year old male with complicated medical history presents with congestion, sneeze a lot in last 5-6 days Using nasal spray. Now progressing to productive cough, thick yellow in past 3 days. Fatigue. Sore throat.  no fever. No SOB  currently, mild wheeze beginning. Has been using nebulizer just once a night before bed more for mucus than anything else.  Last course of antibiotic and prednisone in 11/2010. Prednisone and cough syrup helps a lot in past with similar issues.   Shaking he had at last OV improved with xopenex instaed of albuterol.    Review of Systems  Constitutional: Positive for fatigue. Negative for fever.  HENT: Negative for ear pain.   Eyes: Negative for pain.  Respiratory: Positive for cough.   Cardiovascular: Negative for chest pain, palpitations and leg swelling.       Objective:   Physical Exam  Constitutional: Vital signs are normal. He appears well-developed and well-nourished.  Non-toxic appearance. He does not appear ill. No distress.  HENT:  Head: Normocephalic and atraumatic.  Right Ear: Hearing, tympanic membrane, external ear and ear canal normal. No tenderness. No foreign bodies. Tympanic membrane is not retracted and not bulging.  Left Ear: Hearing, tympanic membrane, external ear and ear canal normal. No tenderness. No foreign bodies. Tympanic membrane is not retracted and not bulging.  Nose: Nose normal. No mucosal edema or rhinorrhea. Right sinus exhibits no maxillary sinus tenderness and no frontal sinus tenderness. Left sinus exhibits no maxillary sinus tenderness and no frontal sinus tenderness.  Mouth/Throat: Uvula is midline, oropharynx is clear and moist and mucous membranes are normal. Normal dentition. No dental caries. No oropharyngeal exudate or tonsillar abscesses.    Eyes: Conjunctivae, EOM and lids are normal. Pupils are equal, round, and reactive to light. No foreign bodies found.  Neck: Trachea normal, normal range of motion and phonation normal. Neck supple. Carotid bruit is not present. No mass and no thyromegaly present.  Cardiovascular: Normal rate, regular rhythm, S1 normal, S2 normal, normal heart sounds, intact distal pulses and normal pulses.  Exam reveals no gallop.   No murmur heard. Pulmonary/Chest: Effort normal and breath sounds normal. No respiratory distress. He has no wheezes. He has no rhonchi. He has no rales. He exhibits no tenderness.  Abdominal: Soft. Normal appearance and bowel sounds are normal. There is no hepatosplenomegaly. There is no tenderness. There is no rebound, no guarding and no CVA tenderness. No hernia.  Neurological: He is alert. He has normal reflexes.  Skin: Skin is warm, dry and intact. No rash noted.  Psychiatric: He has a normal mood and affect. His speech is normal and behavior is normal. Judgment normal.          Assessment & Plan:

## 2010-12-26 NOTE — Patient Instructions (Signed)
Symptomatic care for likely viral infection.  Mucinex during the day, consider OTC allergy med like zyrtec if sneeze continuing. Cough suppressant at night. If wheezing begins .[name] Fill prescription for prednisone taper.   Follow up if not improving as expected.

## 2010-12-30 ENCOUNTER — Ambulatory Visit (HOSPITAL_COMMUNITY)
Admission: RE | Admit: 2010-12-30 | Discharge: 2010-12-30 | Disposition: A | Discharge status: Home / Self Care | Payer: Medicare Other | Source: Ambulatory Visit | Attending: Surgery | Admitting: Surgery

## 2011-01-05 ENCOUNTER — Ambulatory Visit: Payer: Medicare Other

## 2011-01-06 ENCOUNTER — Ambulatory Visit (HOSPITAL_COMMUNITY)
Admission: RE | Admit: 2011-01-06 | Discharge: 2011-01-06 | Disposition: A | Discharge status: Home / Self Care | Payer: Medicare Other | Source: Ambulatory Visit | Attending: Surgery | Admitting: Surgery

## 2011-01-06 ENCOUNTER — Other Ambulatory Visit: Payer: Self-pay

## 2011-01-06 DIAGNOSIS — D649 Anemia, unspecified: Secondary | ICD-10-CM | POA: Insufficient documentation

## 2011-01-06 DIAGNOSIS — M545 Low back pain, unspecified: Secondary | ICD-10-CM | POA: Insufficient documentation

## 2011-01-06 DIAGNOSIS — E119 Type 2 diabetes mellitus without complications: Secondary | ICD-10-CM | POA: Insufficient documentation

## 2011-01-06 DIAGNOSIS — Z6838 Body mass index (BMI) 38.0-38.9, adult: Secondary | ICD-10-CM | POA: Insufficient documentation

## 2011-01-06 DIAGNOSIS — E7800 Pure hypercholesterolemia, unspecified: Secondary | ICD-10-CM | POA: Insufficient documentation

## 2011-01-06 DIAGNOSIS — I4891 Unspecified atrial fibrillation: Secondary | ICD-10-CM | POA: Insufficient documentation

## 2011-01-06 DIAGNOSIS — I10 Essential (primary) hypertension: Secondary | ICD-10-CM | POA: Insufficient documentation

## 2011-01-06 DIAGNOSIS — K219 Gastro-esophageal reflux disease without esophagitis: Secondary | ICD-10-CM | POA: Insufficient documentation

## 2011-01-08 ENCOUNTER — Ambulatory Visit (INDEPENDENT_AMBULATORY_CARE_PROVIDER_SITE_OTHER): Payer: Medicare Other | Admitting: Family Medicine

## 2011-01-08 ENCOUNTER — Encounter (HOSPITAL_COMMUNITY)
Admission: RE | Disposition: A | Discharge status: Home / Self Care | Payer: Self-pay | Source: Ambulatory Visit | Attending: Surgery

## 2011-01-08 ENCOUNTER — Ambulatory Visit (HOSPITAL_COMMUNITY)
Admission: RE | Admit: 2011-01-08 | Discharge: 2011-01-08 | Disposition: A | Discharge status: Home / Self Care | Payer: Medicare Other | Source: Ambulatory Visit | Attending: Surgery | Admitting: Surgery

## 2011-01-08 DIAGNOSIS — I4891 Unspecified atrial fibrillation: Secondary | ICD-10-CM

## 2011-01-08 DIAGNOSIS — Z5181 Encounter for therapeutic drug level monitoring: Secondary | ICD-10-CM

## 2011-01-08 DIAGNOSIS — Z01818 Encounter for other preprocedural examination: Secondary | ICD-10-CM | POA: Insufficient documentation

## 2011-01-08 DIAGNOSIS — Z7901 Long term (current) use of anticoagulants: Secondary | ICD-10-CM

## 2011-01-08 DIAGNOSIS — I824Y9 Acute embolism and thrombosis of unspecified deep veins of unspecified proximal lower extremity: Secondary | ICD-10-CM

## 2011-01-08 HISTORY — PX: BREATH TEK H PYLORI: SHX5422

## 2011-01-08 LAB — POCT INR: INR: 2.5

## 2011-01-08 SURGERY — BREATH TEST, FOR HELICOBACTER PYLORI
Anesthesia: Choice

## 2011-01-08 NOTE — Patient Instructions (Signed)
Continue current dose, check in 4 weeks  

## 2011-01-09 ENCOUNTER — Encounter (HOSPITAL_COMMUNITY): Payer: Self-pay

## 2011-01-09 ENCOUNTER — Encounter (HOSPITAL_COMMUNITY): Payer: Self-pay | Admitting: Surgery

## 2011-01-12 ENCOUNTER — Encounter (HOSPITAL_BASED_OUTPATIENT_CLINIC_OR_DEPARTMENT_OTHER): Payer: Medicare Other

## 2011-01-16 ENCOUNTER — Encounter: Payer: Self-pay | Admitting: Family Medicine

## 2011-01-16 ENCOUNTER — Ambulatory Visit (INDEPENDENT_AMBULATORY_CARE_PROVIDER_SITE_OTHER): Payer: Medicare Other | Admitting: Family Medicine

## 2011-01-16 DIAGNOSIS — E669 Obesity, unspecified: Secondary | ICD-10-CM

## 2011-01-16 DIAGNOSIS — J45901 Unspecified asthma with (acute) exacerbation: Secondary | ICD-10-CM

## 2011-01-16 NOTE — Patient Instructions (Signed)
No clear current indication for more steroids or antibiotics. Given symptoms more time to resolved. Make appt  In next month with pulmonary doctor to discuss daily controller medication to prevent/decrease asthma/COPD flares. Follow up for 1 month for  weight eval.

## 2011-01-16 NOTE — Assessment & Plan Note (Signed)
Gradually improving.. Lungs clear on exam today. No clear indication for steroids or antibiotics. Continue inhalers prn. Recommended daily controlled medicaiton to decrease exacerbation frequency.. Pt hesitant for additional med. Recommended follow up with pulm for further recommendations.

## 2011-01-16 NOTE — Progress Notes (Signed)
  Subjective:    Patient ID: [name], male    DOB: 19-Oct-1940, 70 y.o.   MRN: [id number]  HPI  70 year old male presents for monthly weight loss evaluation prior to bariatric surgery. On last month he has lost 8 lbs!  Doing bicycle  3 miles 4-5 times a week. Doing weight lifting with machines...4-5 times a week. Continuing to eat veggies, small amount of meat, no potatos, no bread, no sweets.  Eating 3 meals a day, occ 2 if stressed or busy. Saw nutritionist on 12/16/2010   COPD/ asthma exacerbation: He reports he has had continue cough  all day and wheezing at night. Cough not keeping him up at night. 11/16 started course of prednisone...Marland Kitchenhelped but never resolved completely. Using albuterol 2 time a day... Using more to break up phlegm. No change in sputum production.  Has not seen pulm in a while.. 2011  Review of Systems     Objective:   Physical Exam        Assessment & Plan:

## 2011-01-16 NOTE — Assessment & Plan Note (Signed)
Has continued to make change in increasing exercise and improving diet.  See paperwork completed.  Recommendations made.  Follow up in  1 month.

## 2011-02-04 ENCOUNTER — Telehealth: Payer: Self-pay | Admitting: Gastroenterology

## 2011-02-04 NOTE — Telephone Encounter (Signed)
Advised align is OTC

## 2011-02-05 ENCOUNTER — Ambulatory Visit (INDEPENDENT_AMBULATORY_CARE_PROVIDER_SITE_OTHER): Payer: Medicare Other | Admitting: Family Medicine

## 2011-02-05 ENCOUNTER — Institutional Professional Consult (permissible substitution): Payer: Medicare Other | Admitting: Internal Medicine

## 2011-02-05 DIAGNOSIS — Z5181 Encounter for therapeutic drug level monitoring: Secondary | ICD-10-CM

## 2011-02-05 DIAGNOSIS — Z7901 Long term (current) use of anticoagulants: Secondary | ICD-10-CM

## 2011-02-05 DIAGNOSIS — I4891 Unspecified atrial fibrillation: Secondary | ICD-10-CM

## 2011-02-05 DIAGNOSIS — I824Y9 Acute embolism and thrombosis of unspecified deep veins of unspecified proximal lower extremity: Secondary | ICD-10-CM

## 2011-02-05 LAB — POCT INR: INR: 2.4

## 2011-02-05 NOTE — Patient Instructions (Signed)
2.5 mg daily, 1.25 mg TUE Magdalene Molly,

## 2011-02-11 ENCOUNTER — Other Ambulatory Visit: Payer: Self-pay | Admitting: Internal Medicine

## 2011-02-11 MED ORDER — METOPROLOL SUCCINATE ER 25 MG PO TB24: 25.0000 mg | ORAL_TABLET | Freq: Two times a day (BID) | ORAL | Status: DC

## 2011-02-11 NOTE — Telephone Encounter (Signed)
Rx faxed to pharmacy  

## 2011-02-16 ENCOUNTER — Ambulatory Visit (HOSPITAL_BASED_OUTPATIENT_CLINIC_OR_DEPARTMENT_OTHER): Payer: Medicare Other

## 2011-02-18 ENCOUNTER — Ambulatory Visit (INDEPENDENT_AMBULATORY_CARE_PROVIDER_SITE_OTHER): Payer: Medicare Other | Admitting: Internal Medicine

## 2011-02-18 ENCOUNTER — Encounter: Payer: Self-pay | Admitting: Internal Medicine

## 2011-02-18 VITALS — BP 118/82 | HR 80 | Temp 97.9°F | Ht 75.0 in | Wt 306.0 lb

## 2011-02-18 DIAGNOSIS — R05 Cough: Secondary | ICD-10-CM | POA: Diagnosis not present

## 2011-02-18 DIAGNOSIS — Z01811 Encounter for preprocedural respiratory examination: Secondary | ICD-10-CM

## 2011-02-18 NOTE — Patient Instructions (Addendum)
#  Cough - I am wondering if you might have asthma but there could be other things causing your cough  Such as throat issues or GERD or sinus issues called LPR cough  -  - Please have breathing test called full PFt  - Once done have technician leave it on my desk for review: -  I will call you back in week with next step (more tests or come in for visit) - if this test is normal do methacholine challenge test (ok to di it with A Fib hx because of negative cardiac stress test hx few years ago) #Preop pulmonary evaluation  - this depends on outcome of cough evaluation  - follow with Dr Jenne Pane as well and make sure things are ok from throat standpoint for surgery #Followup  - timing to be based on full PFT results v getting methacholine challenge test

## 2011-02-18 NOTE — Progress Notes (Signed)
Subjective:    Patient ID: [name], male    DOB: Nov 14, 1940, 71 y.o.   MRN: [id number]  HPI OV 09/05/2009: Urgent hospital followup for this 71 year old diabetic who suffered acute and chronic critical illness with Strep viridians bacteremia and multiorgan failure. 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 Molli Knock. Dishcarged from hospital around 08/16/2009 and from inpatient reab several weeks later. Post discharge he saw PMD on 08/28/2009 and had complained of sorethroat. Nystatin was prescribed the following day 7/21 but he revisisted 09/02/2009 with sore throat, wheeze (points to throat), cough, and even low grade fever. Rapid strep was negative but given 10% false negative rate he was treated with empiric amoxcilin. However, he represted to PMD office earlier today 09/05/2009 for persistent symptoms. THerefore referred here. HE is not too keen on admission. REC: ENT EVAL   OV November 07, 2009: Since last visit saw Dr. Jenne Pane 09/12/2009, 09/17/2009 and 09/26/2009. Also saw a Dr. Delford Field 09/25/2009. Per hx and review of records he has been diagnosed with laryngeal edema and neuropathy. He is s/p 1 course augmentin and short pred burst in early august and 4 week neurontin ending mid-sept 2011. AFter this, voice issues are completely resolved per him and wife. Main issue is now is recurrent episodes of chest tightness "something is squeezing my chest and is not chest pain". With these episodes ther is cough and dyspnea as well. HAs needed 4-5 rounds of antibiotics/pred which help each time immensely. However, each time he comes off steroids/abx, episodes relapse. Dr. Patsy Lager notes "remote hx of asthma" and ICU notes from June say "chronic steroids for asthma / joint pain" but he and wife deny that there was chronic use but admit to frequent intermittent use prior to ICU illness. Also, feels fatgiued. Currently on cipro for prostatitis    Patient Instructions:  1) your walking oxygen test is fine  2) please  have full breathing test called PFT  3) have cxr  4) once you are done with that, call us  5) I will revie those over phone and decide next step  6) refer to rehab at [facility]  7) have flu shot when finished with cipro  OV 02/18/2011 71 year old male. Not seen in over 1 year. Reports occasional cough that he describes as "minor' cough. States he is coming up for gastric bypass surgery in next 3-4 months. So PMD referred here for pulmonary evaluation prior to gastric surgery by Dr Wenda Low of [facility]. He says PMD keeps hearing "rattle" in chest during exam.  States cough present daily x 6 weeks. However, reports intermittent episodoes of cough 4 times atleast in 2012 for which he has been on prednisone bursts. Sporadic cough. Cough made worse by dry throat but not by dust, perfume, smoke. Cough improved by prednisone completely and some by nebulizer.  Cough associated some dyspnea. Quality of cough is dry. Assoicated occassional nocturnal awakening + and some nocturnal wheezing +. Early morning cough is worst with sputum and has very difficult time clearing cough. Also reports feeling of 'glob in throat" and feels something stuck in throat Never smoked. Carries childhood asthma dx - uses only albuterol prn  Of note, since tracheostomy hoarse voice - mid 2011. THis has improved. Follows with Dr Jenne Pane ENT. Says last ov with him was 3 months ago. Denies tracheal narrowing. Says it was "just nerve damage". Since sept 2011 has has had 2 back surgeries under [facility] without upper airway issues  Kouffman RSI  cough score today is 24 and c/w LPR cough  - level 5 sensation of something in throat, excss mucus. Level 4 - cough after lying down and annoying cough. Level 3 - hoarseness and clearing of throat. Prior testing includes PFT 11/14/2009 - normal  And CXR 01/06/11 - normal   Past Medical History  Diagnosis Date  . Diverticulosis of colon (without mention of hemorrhage)   . Other pulmonary embolism and infarction     . Restless legs syndrome (RLS)   . Obesity, unspecified   . Irritable bowel syndrome   . Gastroparesis   . Unspecified essential hypertension   . Lumbago   . Depressive disorder, not elsewhere classified   . Anxiety state, unspecified   . Unspecified asthma   . Esophageal reflux   . Atrial fibrillation   . Pure hypercholesterolemia   . Type II or unspecified type diabetes mellitus without mention of complication, not stated as uncontrolled   . Hemorrhoids      Family History  Problem Relation Age of Onset  . Uterine cancer Mother     mets  . Breast cancer Mother   . Cancer Mother     cervical  . Emphysema Father   . Alzheimer's disease Sister   . Prostate cancer Brother   . Heart attack Brother   . Colon cancer Brother 11     History   Social History  . Marital Status: Married    Spouse Name: N/A    Number of Children: 1  . Years of Education: N/A   Occupational History  . Retired     Naval architect   Social History Main Topics  . Smoking status: Never Smoker   . Smokeless tobacco: Never Used  . Alcohol Use: No  . Drug Use: No  . Sexually Active: Not on file   Other Topics Concern  . Not on file   Social History Narrative   DIET: 3 meals, F&V, some water, crystal light, No fast foodExercise: walks treadmill 30 min1 Caffeine drinks daily No living will.      Allergies  Allergen Reactions  . Oxycodone   . Sulfacetamide Sodium     REACTION: Throat swelling     Outpatient Prescriptions Prior to Visit  Medication Sig Dispense Refill  . albuterol (PROVENTIL) (2.5 MG/3ML) 0.083% nebulizer solution Take 3 mLs (2.5 mg total) by nebulization every 6 (six) hours as needed for wheezing.  75 mL  12  . atorvastatin (LIPITOR) 80 MG tablet Take 1 tablet (80 mg total) by mouth daily.  90 tablet  3  . b complex-C-folic acid 1 MG capsule Take 1 capsule by mouth daily.        . bifidobacterium infantis (ALIGN) capsule Take one tablet by mouth once a day while on  Xifaxan.  14 capsule  0  . fenofibrate 160 MG tablet Take 1 tablet (160 mg total) by mouth daily.  90 tablet  3  . ferrous sulfate 325 (65 FE) MG tablet Take 325 mg by mouth. Take one tablet twice daily       . FLUoxetine (PROZAC) 40 MG capsule Take 1 capsule (40 mg total) by mouth daily.  90 capsule  3  . furosemide (LASIX) 20 MG tablet 1 tab po daily prn peripheral swelling  90 tablet  3  . gabapentin (NEURONTIN) 300 MG capsule 600 mg 2 (two) times daily.       . Glucose Blood (BAYER BREEZE 2 TEST) DISK by In Vitro route.  Check blood sugar three times a day       . HYDROcodone-acetaminophen (NORCO) 10-325 MG per tablet Take 1 tablet by mouth. 1-2 every 4-6 hours as needed MAX acetaminophen is 4000 mg daily       . insulin aspart (NOVOLOG FLEXPEN) 100 UNIT/ML injection Inject 14 Units into the skin as directed. Sliding scale      . insulin detemir (LEVEMIR) 100 UNIT/ML injection Inject 40 Units into the skin daily. 30 units in am once a day      . Insulin Syringe-Needle U-100 (BD INSULIN SYRINGE ULTRAFINE) 31G X 5/16" 0.5 ML MISC by Does not apply route. Inject insulin four times a day       . Lancets MISC by Does not apply route. Check blood sugar 3 times a day       . levalbuterol (XOPENEX) 0.63 MG/3ML nebulizer solution Take 3 mLs (0.63 mg total) by nebulization every 6 (six) hours as needed for wheezing.  3 mL  1  . metoprolol succinate (TOPROL-XL) 25 MG 24 hr tablet Take 1 tablet (25 mg total) by mouth 2 (two) times daily.  90 tablet  3  . omeprazole (PRILOSEC) 40 MG capsule Take 1 capsule (40 mg total) by mouth 2 (two) times daily.  180 capsule  3  . predniSONE (DELTASONE) 20 MG tablet 3 tabs by mouth daily x 3 days, then 2 tabs by mouth daily x 2 days then 1 tab by mouth daily x 2 days  15 tablet  0  . VIGAMOX 0.5 % ophthalmic solution       . warfarin (COUMADIN) 2.5 MG tablet Take 1 tablet (2.5 mg total) by mouth daily. As directed   90 tablet  3      Review of Systems    Constitutional: Negative for fever and unexpected weight change.  HENT: Negative for ear pain, nosebleeds, congestion, sore throat, rhinorrhea, sneezing, trouble swallowing, dental problem, postnasal drip and sinus pressure.   Eyes: Negative for redness and itching.  Respiratory: Positive for cough and wheezing. Negative for chest tightness and shortness of breath.   Cardiovascular: Negative for palpitations and leg swelling.  Gastrointestinal: Negative for nausea and vomiting.  Genitourinary: Negative for dysuria.  Musculoskeletal: Negative for joint swelling.  Skin: Negative for rash.  Neurological: Negative for headaches.  Hematological: Does not bruise/bleed easily.  Psychiatric/Behavioral: Negative for dysphoric mood. The patient is not nervous/anxious.        Objective:   Physical Exam General: obese. looks stronger  Head: normocephalic and atraumatic.  Eyes: PERRLA/EOM intact; conjunctiva and sclera clear  Nose: no external deformity.  Mouth: Oral mucosa and oropharynx without lesions or exudates. Teeth in good repair.  Neck: HOARSE VOICE -+ TRACH DECANNULATED - SCAR +  Chest Wall: no deformities or breast masses noted  Lungs: Normal RR and no acute distress, but exp wheezing on exam with coarse BS, without focal crackles on my exam  Heart: Normal rate and regular rhythm. S1 and S2 normal without gallop, murmur, click, rub or other extra sounds.  Abdomen: Bowel sounds positive,abdomen soft and non-tender without masses, organomegaly or hernias noted.  Msk: Back normal, normal gait. Muscle strength and tone normal.  Pulses: R and L posterior tibial pulses are full and equal bilaterally  Extremities: B varicose veins  Neurologic: alert & oriented X3.  Skin: Intact without suspicious lesions or rashes  Cervical Nodes: No lymphadenopathy noted  Axillary Nodes: no significant adenopathy  Psych: depressed affect.  Assessment & Plan:

## 2011-02-20 ENCOUNTER — Ambulatory Visit (INDEPENDENT_AMBULATORY_CARE_PROVIDER_SITE_OTHER): Payer: Medicare Other | Admitting: Family Medicine

## 2011-02-20 ENCOUNTER — Encounter: Payer: Self-pay | Admitting: Family Medicine

## 2011-02-20 VITALS — BP 120/72 | HR 89 | Temp 98.0°F | Ht 75.0 in | Wt 298.0 lb

## 2011-02-20 DIAGNOSIS — E669 Obesity, unspecified: Secondary | ICD-10-CM

## 2011-02-20 MED ORDER — TOPIRAMATE 25 MG PO TABS: 75.0000 mg | ORAL_TABLET | Freq: Every day | ORAL | Status: DC

## 2011-02-20 NOTE — Assessment & Plan Note (Signed)
See scanned in medically supervised weight loss form.

## 2011-02-20 NOTE — Progress Notes (Signed)
  Subjective:    Patient ID: [name], male    DOB: [birth year], 71 y.o.   MRN: [id number]  HPI  71 year old male presents for monthly weight loss evaluation prior to bariatric surgery.  Visit 4 of 7. Started following 11/2010. Gained some weight back then lost it again.. Weight stable from last OV. Doing bicycle 3-38miles 4-5 times a week.  Doing weight lifting with machines...4-5 times a week.  Strengthening back and legs given chronic pain issues. Continuing to eat veggies, small amount of meat, no potatos, no bread, no sweets.  Did well on diet over Christmas. Eating 3 meals a day, occ 2 if stressed or busy.   Has not seen nutritionist again.  Saw Dr Marchelle Gearing for breathing issues.. Has upcoming PFTs and methacholine challenge.  HAs seen Dr. Neale Burly in past for migraine prevention: On topirimate which helped significantly.  Using tylenol four tabs daily... Much less than in past before taking topiramate. No SE to topiramate.75 mg at bedtime. Having  low grade daily headaches per pt since age 58 years  , rarely becomes severe.  Review of Systems  Constitutional: Negative for fever and fatigue.  Respiratory: Negative for cough and shortness of breath.   Cardiovascular: Negative for chest pain.       Objective:   Physical Exam  Constitutional: Vital signs are normal. He appears well-developed and well-nourished.       Obese appearing male in NAD  HENT:  Head: Normocephalic.  Right Ear: Hearing normal.  Left Ear: Hearing normal.  Nose: Nose normal.  Mouth/Throat: Oropharynx is clear and moist and mucous membranes are normal.  Neck: Trachea normal. Carotid bruit is not present. No mass and no thyromegaly present.  Cardiovascular: Normal rate, regular rhythm and normal pulses.  Exam reveals no gallop, no distant heart sounds and no friction rub.   No murmur heard.      No peripheral edema  Pulmonary/Chest: Effort normal and breath sounds normal. No respiratory  distress.  Skin: Skin is warm, dry and intact. No rash noted.  Psychiatric: He has a normal mood and affect. His speech is normal and behavior is normal. Thought content normal.          Assessment & Plan:

## 2011-02-20 NOTE — Patient Instructions (Signed)
Follow up in early February for 1 month follow up weight check.

## 2011-02-22 DIAGNOSIS — R05 Cough: Secondary | ICD-10-CM | POA: Insufficient documentation

## 2011-02-22 DIAGNOSIS — Z01811 Encounter for preprocedural respiratory examination: Secondary | ICD-10-CM | POA: Insufficient documentation

## 2011-02-22 DIAGNOSIS — R053 Chronic cough: Secondary | ICD-10-CM | POA: Insufficient documentation

## 2011-02-22 NOTE — Assessment & Plan Note (Signed)
#  Preop pulmonary evaluation  - this depends on outcome of cough evaluation  - follow with Dr Jenne Pane as well and make sure things are ok from throat standpoint for surgery #Followup  - timing to be based on full PFT results v getting methacholine challenge test

## 2011-02-22 NOTE — Assessment & Plan Note (Signed)
#  Cough - I am wondering if you might have asthma but there could be other things causing your cough  Such as throat issues or GERD or sinus issues called LPR cough  -  - Please have breathing test called full PFt  - Once done have technician leave it on my desk for review: -  I will call you back in week with next step (more tests or come in for visit) - if this test is normal do methacholine challenge test (ok to di it with A Fib hx because of negative cardiac stress test hx few years ago)

## 2011-02-23 ENCOUNTER — Ambulatory Visit (INDEPENDENT_AMBULATORY_CARE_PROVIDER_SITE_OTHER): Payer: Medicare Other | Admitting: Internal Medicine

## 2011-02-23 DIAGNOSIS — R0600 Dyspnea, unspecified: Secondary | ICD-10-CM

## 2011-02-23 DIAGNOSIS — R0989 Other specified symptoms and signs involving the circulatory and respiratory systems: Secondary | ICD-10-CM

## 2011-02-23 DIAGNOSIS — R0609 Other forms of dyspnea: Secondary | ICD-10-CM

## 2011-02-23 NOTE — Progress Notes (Signed)
PFT done today. 

## 2011-02-26 DIAGNOSIS — M542 Cervicalgia: Secondary | ICD-10-CM | POA: Diagnosis not present

## 2011-02-26 DIAGNOSIS — M9981 Other biomechanical lesions of cervical region: Secondary | ICD-10-CM | POA: Diagnosis not present

## 2011-02-27 ENCOUNTER — Telehealth: Payer: Self-pay | Admitting: Internal Medicine

## 2011-02-27 DIAGNOSIS — J45909 Unspecified asthma, uncomplicated: Secondary | ICD-10-CM

## 2011-02-27 NOTE — Telephone Encounter (Signed)
I spoke with [name] and she states pt had PFT done and wanted to know what MR wanted to do next. I advised her according to his note once he review the pft he will decide and will let them know. She voiced her understanding. Please advise MR, thanks

## 2011-02-28 NOTE — Telephone Encounter (Signed)
Pfts normal.  Given clear cxr in nov 2012. NExt step is to confirm asthma. Please order methacholine challenge test and then OV with me for result review. He is to be off all inahled steroids for 3 weeks before tests, singulair if any for 1 week

## 2011-03-02 NOTE — Telephone Encounter (Signed)
Called spoke with patient's spuse, [name].  Advised of MR's recs.  [name] okay with these recs and verbalized her understanding.  Methacholine challenge ordered with recs for stopping inhaled steroids and/or singulair, and follow up in the comments.  Nothing further needed.  Will sign off.

## 2011-03-05 ENCOUNTER — Ambulatory Visit: Payer: Medicare Other

## 2011-03-09 ENCOUNTER — Ambulatory Visit (INDEPENDENT_AMBULATORY_CARE_PROVIDER_SITE_OTHER): Payer: Medicare Other | Admitting: Family Medicine

## 2011-03-09 ENCOUNTER — Encounter: Payer: Self-pay | Admitting: Internal Medicine

## 2011-03-09 DIAGNOSIS — Z7901 Long term (current) use of anticoagulants: Secondary | ICD-10-CM | POA: Diagnosis not present

## 2011-03-09 DIAGNOSIS — I824Y9 Acute embolism and thrombosis of unspecified deep veins of unspecified proximal lower extremity: Secondary | ICD-10-CM | POA: Diagnosis not present

## 2011-03-09 DIAGNOSIS — I4891 Unspecified atrial fibrillation: Secondary | ICD-10-CM | POA: Diagnosis not present

## 2011-03-09 DIAGNOSIS — Z5181 Encounter for therapeutic drug level monitoring: Secondary | ICD-10-CM | POA: Diagnosis not present

## 2011-03-09 LAB — POCT INR: INR: 2.9

## 2011-03-09 NOTE — Patient Instructions (Signed)
Continue current dose, check in 4 weeks  

## 2011-03-16 ENCOUNTER — Ambulatory Visit: Payer: Medicare Other | Admitting: Family Medicine

## 2011-03-19 ENCOUNTER — Encounter: Payer: Self-pay | Admitting: Family Medicine

## 2011-03-19 ENCOUNTER — Ambulatory Visit (INDEPENDENT_AMBULATORY_CARE_PROVIDER_SITE_OTHER): Payer: Medicare Other | Admitting: Family Medicine

## 2011-03-19 DIAGNOSIS — E669 Obesity, unspecified: Secondary | ICD-10-CM | POA: Diagnosis not present

## 2011-03-19 NOTE — Progress Notes (Signed)
  Patient Name: [name] Date of Birth: 28-Aug-1940 Age: 71 y.o. Medical Record Number: [id number] Gender: male Date of Encounter: 03/19/2011  History of Present Illness:  [name] is a 71 y.o. very pleasant male patient who presents with the following:  Wt Readings from Last 3 Encounters:  03/19/11 294 lb 1.6 oz (133.403 kg)  02/20/11 298 lb (135.172 kg)  02/18/11 306 lb (138.801 kg)   Body mass index is 36.76 kg/(m^2).   He is doing well. He continues to lose weight. He is working on his diet, eating much better. Plenty of fruits and vegetables. Less fatty foods. Less sweets. He is exercising 4-5 days a week around 45 minutes at a time. Some strength training as well as riding a bicycle.  Past Medical History, Surgical History, Social History, Family History, Problem List, Medications, and Allergies have been reviewed and updated if relevant.  Review of Systems: Doing well. Essentially no complaints. No chest pain. No significant shortness of breath right now.  Physical Examination: Filed Vitals:   03/19/11 0803  BP: 120/70  Pulse: 88  Temp: 97.6 F (36.4 C)  TempSrc: Oral  Height: 6\' 3"  (1.905 m)  Weight: 294 lb 1.6 oz (133.403 kg)  SpO2: 96%    Body mass index is 36.76 kg/(m^2).   GEN: WDWN, NAD, Non-toxic, A & O x 3 HEENT: Atraumatic, Normocephalic. Neck supple. No masses, No LAD. Ears and Nose: No external deformity. CV: RRR, No M/G/R. No JVD. No thrill. No extra heart sounds. PULM: CTA B, no wheezes, crackles, rhonchi. No retractions. No resp. distress. No accessory muscle use. EXTR: No c/c/e NEURO Normal gait.  PSYCH: Normally interactive. Conversant. Not depressed or anxious appearing.  Calm demeanor.    Assessment and Plan: 1. Obesity     >15 minutes spent in face to face time with patient, >50% spent in counselling or coordination of care: Doing well. Discussed diet and exercise. I think he is making excellent progress. He is ultimately  going to be having a lap band. Currently he is undergoing his six-month medical management for weight loss. Recheck 1 month.

## 2011-03-19 NOTE — Patient Instructions (Signed)
F/u 1 month 

## 2011-03-20 DIAGNOSIS — D0460 Carcinoma in situ of skin of unspecified upper limb, including shoulder: Secondary | ICD-10-CM | POA: Diagnosis not present

## 2011-03-20 DIAGNOSIS — Z6841 Body Mass Index (BMI) 40.0 and over, adult: Secondary | ICD-10-CM | POA: Diagnosis not present

## 2011-03-20 DIAGNOSIS — L570 Actinic keratosis: Secondary | ICD-10-CM | POA: Diagnosis not present

## 2011-03-20 DIAGNOSIS — L821 Other seborrheic keratosis: Secondary | ICD-10-CM | POA: Diagnosis not present

## 2011-03-20 DIAGNOSIS — E1149 Type 2 diabetes mellitus with other diabetic neurological complication: Secondary | ICD-10-CM | POA: Diagnosis not present

## 2011-03-20 DIAGNOSIS — E1142 Type 2 diabetes mellitus with diabetic polyneuropathy: Secondary | ICD-10-CM | POA: Diagnosis not present

## 2011-03-20 DIAGNOSIS — C44621 Squamous cell carcinoma of skin of unspecified upper limb, including shoulder: Secondary | ICD-10-CM | POA: Diagnosis not present

## 2011-03-25 ENCOUNTER — Encounter (HOSPITAL_COMMUNITY): Payer: Medicare Other

## 2011-03-27 ENCOUNTER — Ambulatory Visit: Payer: Medicare Other | Admitting: Internal Medicine

## 2011-03-27 DIAGNOSIS — H01009 Unspecified blepharitis unspecified eye, unspecified eyelid: Secondary | ICD-10-CM | POA: Diagnosis not present

## 2011-03-30 ENCOUNTER — Ambulatory Visit (INDEPENDENT_AMBULATORY_CARE_PROVIDER_SITE_OTHER): Payer: Medicare Other | Admitting: Family Medicine

## 2011-03-30 ENCOUNTER — Encounter: Payer: Self-pay | Admitting: Family Medicine

## 2011-03-30 VITALS — BP 120/60 | HR 111 | Temp 98.5°F | Ht 75.0 in | Wt 289.0 lb

## 2011-03-30 DIAGNOSIS — K529 Noninfective gastroenteritis and colitis, unspecified: Secondary | ICD-10-CM

## 2011-03-30 DIAGNOSIS — K5289 Other specified noninfective gastroenteritis and colitis: Secondary | ICD-10-CM

## 2011-03-30 DIAGNOSIS — E119 Type 2 diabetes mellitus without complications: Secondary | ICD-10-CM

## 2011-03-30 DIAGNOSIS — E860 Dehydration: Secondary | ICD-10-CM

## 2011-03-30 MED ORDER — FLUOXETINE HCL 40 MG PO CAPS: 40.0000 mg | ORAL_CAPSULE | Freq: Every day | ORAL | Status: DC

## 2011-03-30 MED ORDER — ONDANSETRON 8 MG PO TBDP: 8.0000 mg | ORAL_TABLET | Freq: Three times a day (TID) | ORAL | Status: AC | PRN

## 2011-03-30 NOTE — Progress Notes (Signed)
  Patient Name: [name] Date of Birth: February 16, 1940 Age: 71 y.o. Medical Record Number: [id number] Gender: male Date of Encounter: 03/30/2011  History of Present Illness:  [name] is a 71 y.o. very pleasant male patient who presents with the following:  Yesterday, ate a sausage biscuit, started to get some diarhea, profusely every 40 minutes to the bathroom. Throughout the day, within 15 minutes, has been throwing up and having diarrhea at least once an hour. Has been throwing everything up, gave some nausea medication and enough got in, so he got up all night with diarrhea. No fever.   He has been up all night, having diarrhea about every 40 minutes. He continues to have significant, severe nausea. No known sick contacts.  Past Medical History, Surgical History, Social History, Family History, Problem List, Medications, and Allergies have been reviewed and updated if relevant.  Review of Systems: ROS: GEN: Acute illness details above GI: minimal po intake - ice chips only GU: as above Pulm: No SOB Interactive and getting along well at home.  Otherwise, ROS is as per the HPI.   Physical Examination: Filed Vitals:   03/30/11 0949  BP: 120/60  Pulse: 111  Temp: 98.5 F (36.9 C)  TempSrc: Oral  Height: 6\' 3"  (1.905 m)  Weight: 289 lb (131.09 kg)  SpO2: 97%    Body mass index is 36.12 kg/(m^2).   GEN: WDWN, NAD, Non-toxic, A & O x 3 HEENT: Atraumatic, Normocephalic. Neck supple. No masses, No LAD. Ears and Nose: No external deformity. CV: RRR today, No M/G/R. No JVD. No thrill. No extra heart sounds. PULM: CTA B, no wheezes, crackles, rhonchi. No retractions. No resp. distress. No accessory muscle use. EXTR: No c/c/e NEURO Normal gait.  PSYCH: Normally interactive. Conversant. Not depressed or anxious appearing.  Calm demeanor.    Assessment and Plan: 1. Gastroenteritis  ondansetron (ZOFRAN-ODT) 8 MG disintegrating tablet  2. Dehydration      Severe  nausea and vomiting with also severe diarrhea. There is in [place] outbreak in her community. Supportive care. Zofran p.r.n. Refer to the patient instructions sections for details of plan shared with patient.   Modified insulin dosing

## 2011-03-30 NOTE — Patient Instructions (Signed)
Prevent dehydration: drink Gatorade, Pedialyte, Ginger Ale, popsicles  Immodium A-D over ther counter - 1 after each bowel movement  day 1: clear liquids day 1: clear liquids--2-3 oz every 45-60 min. SMALL SIPS OR ICE CHIPS 7-up, ginger ale, sprite tea--no cofee chicken broth plain jello Water  Day 2: contnue clear liquids and add BRAT diet B--banana R--rice---can use chicken noodle or rice soup A--apple sauce T--dry toast GRITS, CREAM OF WHEAT, OATMEAL OK  Day 3: continue day 2 and add simple, non fat non spicy foods1 at a time to diet as canned peaches or pears backed or broiled chicken breast---or lunch meat boiled white potato-cook in chicken broth Chicken and rice or chicken noodles  Modified Novolog scale < 150, none 150-200, 2 units 200-250, 4 units 250-300, 6 units 300-350, 8 units 350-400, 10 units

## 2011-04-02 ENCOUNTER — Encounter (HOSPITAL_COMMUNITY): Payer: Medicare Other

## 2011-04-06 ENCOUNTER — Ambulatory Visit: Payer: Medicare Other | Admitting: Internal Medicine

## 2011-04-06 ENCOUNTER — Ambulatory Visit: Payer: Medicare Other

## 2011-04-09 ENCOUNTER — Encounter (HOSPITAL_COMMUNITY): Payer: Medicare Other

## 2011-04-10 ENCOUNTER — Ambulatory Visit (INDEPENDENT_AMBULATORY_CARE_PROVIDER_SITE_OTHER): Payer: Medicare Other | Admitting: Family Medicine

## 2011-04-10 DIAGNOSIS — Z5181 Encounter for therapeutic drug level monitoring: Secondary | ICD-10-CM

## 2011-04-10 DIAGNOSIS — I824Y9 Acute embolism and thrombosis of unspecified deep veins of unspecified proximal lower extremity: Secondary | ICD-10-CM

## 2011-04-10 DIAGNOSIS — I4891 Unspecified atrial fibrillation: Secondary | ICD-10-CM

## 2011-04-10 DIAGNOSIS — Z7901 Long term (current) use of anticoagulants: Secondary | ICD-10-CM | POA: Diagnosis not present

## 2011-04-10 NOTE — Patient Instructions (Signed)
Continue  2.5 mg daily recheck 2 weeks 

## 2011-04-14 DIAGNOSIS — L259 Unspecified contact dermatitis, unspecified cause: Secondary | ICD-10-CM | POA: Diagnosis not present

## 2011-04-16 ENCOUNTER — Ambulatory Visit (INDEPENDENT_AMBULATORY_CARE_PROVIDER_SITE_OTHER): Payer: Medicare Other | Admitting: Family Medicine

## 2011-04-16 ENCOUNTER — Encounter: Payer: Self-pay | Admitting: Family Medicine

## 2011-04-16 VITALS — BP 120/72 | HR 66 | Temp 97.4°F | Ht 75.0 in | Wt 291.0 lb

## 2011-04-16 DIAGNOSIS — E669 Obesity, unspecified: Secondary | ICD-10-CM

## 2011-04-16 NOTE — Progress Notes (Signed)
Patient Name: [name] Date of Birth: 11-May-1940 Medical Record Number: [id number] Gender: male Date of Encounter: 04/16/2011  History of Present Illness:  [name] is a 71 y.o. very pleasant male patient who presents with the following:  Wt Readings from Last 3 Encounters:  04/16/11 291 lb (131.997 kg)  03/30/11 289 lb (131.09 kg)  03/19/11 294 lb 1.6 oz (133.403 kg)   Body mass index is 36.37 kg/(m^2).  He is doing well. He continues to lose weight. (3 pounds from last bariatric check-in) He is working on his diet, eating much better. Plenty of fruits and vegetables. Less fatty foods. Less sweets.   He is exercising 4-5 days a week around 45 minutes at a time. Some strength training as well as riding a bicycle - hindered some now by recent gi illness and some skin issues, seeing Dr. Donzetta Starch   Patient Active Problem List  Diagnoses  . HERPES ZOSTER W/NERVOUS COMPLICATION NEC  . DM  . HYPERCHOLESTEROLEMIA  . UNSPECIFIED ANEMIA  . ANXIETY  . DEPRESSION  . RESTLESS LEG SYNDROME  . HYPERTENSION  . ATRIAL FIBRILLATION, PAROXYSMAL  . ALLERGIC RHINITIS CAUSE UNSPECIFIED  . OTHER DISEASES OF NASAL CAVITY AND SINUSES  . ASTHMA, PERSISTENT, MILD  . GASTROPARESIS  . DIVERTICULOSIS, COLON  . IRRITABLE BOWEL SYNDROME  . RENAL INSUFFICIENCY  . HYDRONEPHROSIS, RIGHT  . LOW BACK PAIN, CHRONIC  . OSTEOPOROSIS, DRUG-INDUCED  . FOOT SURGERY, HX OF  . KNEE REPLACEMENT, LEFT, HX OF  . Acute venous embolism and thrombosis of deep vessels of proximal lower extremity  . Diverticulitis of colon  . Obesity, diabetes, and hypertension syndrome  . GERD (gastroesophageal reflux disease)  . Nonsurgical dumping syndrome  . Bacterial overgrowth syndrome  . Obesity  . Dyspnea  . Acute asthma exacerbation  . Chronic cough  . Preop pulmonary/respiratory exam   Past Medical History  Diagnosis Date  . Diverticulosis of colon (without mention of hemorrhage)   . Other  pulmonary embolism and infarction   . Restless legs syndrome (RLS)   . Obesity, unspecified   . Irritable bowel syndrome   . Gastroparesis   . Unspecified essential hypertension   . Lumbago   . Depressive disorder, not elsewhere classified   . Anxiety state, unspecified   . Unspecified asthma   . Esophageal reflux   . Atrial fibrillation   . Pure hypercholesterolemia   . Type II or unspecified type diabetes mellitus without mention of complication, not stated as uncontrolled   . Hemorrhoids    Past Surgical History  Procedure Date  . Back surgery     X 4   . Knee surgery     Bilateral x 6  . Artery repair     Left forearm  . Foot surgery     Left foot   . Replacement total knee     Left knee   . Breath tek h pylori 01/08/2011    Procedure: BREATH TEK H PYLORI;  Surgeon: Valarie Merino, MD;  Location: [facility];  Service: General;  Laterality: N/A;   History  Substance Use Topics  . Smoking status: Never Smoker   . Smokeless tobacco: Never Used  . Alcohol Use: No   Family History  Problem Relation Age of Onset  . Uterine cancer Mother     mets  . Breast cancer Mother   . Cancer Mother     cervical  . Emphysema Father   . Alzheimer's disease Sister   .  Prostate cancer Brother   . Heart attack Brother   . Colon cancer Brother 75   Allergies  Allergen Reactions  . Oxycodone   . Sulfacetamide Sodium     REACTION: Throat swelling    Medication list has been reviewed and updated.  Review of Systems: As above  Physical Examination: Filed Vitals:   04/16/11 0757  BP: 120/72  Pulse: 66  Temp: 97.4 F (36.3 C)  TempSrc: Oral  Height: 6\' 3"  (1.905 m)  Weight: 291 lb (131.997 kg)  SpO2: 98%    Body mass index is 36.37 kg/(m^2).   GEN: WDWN, NAD, Non-toxic, A & O x 3 HEENT: Atraumatic, Normocephalic. Neck supple. No masses, No LAD. Ears and Nose: No external deformity. CV: RRR, No M/G/R. No JVD. No thrill. No extra heart sounds. PULM: CTA B, no  wheezes, crackles, rhonchi. No retractions. No resp. distress. No accessory muscle use. EXTR: No c/c/e NEURO Normal gait.  PSYCH: Normally interactive. Conversant. Not depressed or anxious appearing.  Calm demeanor.    Assessment and Plan: 1. Obesity     >10 minutes spent in face to face time with patient, >50% spent in counselling or coordination of care: spent in discussion of diet, exercise, lifestyle changes

## 2011-04-20 ENCOUNTER — Ambulatory Visit: Payer: Medicare Other | Admitting: Internal Medicine

## 2011-04-21 DIAGNOSIS — L259 Unspecified contact dermatitis, unspecified cause: Secondary | ICD-10-CM | POA: Diagnosis not present

## 2011-04-23 ENCOUNTER — Encounter (HOSPITAL_COMMUNITY): Payer: Medicare Other

## 2011-04-24 ENCOUNTER — Ambulatory Visit: Payer: Medicare Other

## 2011-04-27 ENCOUNTER — Ambulatory Visit (INDEPENDENT_AMBULATORY_CARE_PROVIDER_SITE_OTHER): Payer: Medicare Other | Admitting: Family Medicine

## 2011-04-27 DIAGNOSIS — Z7901 Long term (current) use of anticoagulants: Secondary | ICD-10-CM | POA: Diagnosis not present

## 2011-04-27 DIAGNOSIS — Z5181 Encounter for therapeutic drug level monitoring: Secondary | ICD-10-CM | POA: Diagnosis not present

## 2011-04-27 DIAGNOSIS — I4891 Unspecified atrial fibrillation: Secondary | ICD-10-CM

## 2011-04-27 DIAGNOSIS — I824Y9 Acute embolism and thrombosis of unspecified deep veins of unspecified proximal lower extremity: Secondary | ICD-10-CM

## 2011-04-27 NOTE — Patient Instructions (Signed)
Continue current dose, check in 4 weeks  

## 2011-04-30 ENCOUNTER — Ambulatory Visit: Payer: Medicare Other | Admitting: Internal Medicine

## 2011-05-01 DIAGNOSIS — E1149 Type 2 diabetes mellitus with other diabetic neurological complication: Secondary | ICD-10-CM | POA: Diagnosis not present

## 2011-05-01 DIAGNOSIS — I10 Essential (primary) hypertension: Secondary | ICD-10-CM | POA: Diagnosis not present

## 2011-05-01 DIAGNOSIS — E669 Obesity, unspecified: Secondary | ICD-10-CM | POA: Diagnosis not present

## 2011-05-01 DIAGNOSIS — E785 Hyperlipidemia, unspecified: Secondary | ICD-10-CM | POA: Diagnosis not present

## 2011-05-01 DIAGNOSIS — E1142 Type 2 diabetes mellitus with diabetic polyneuropathy: Secondary | ICD-10-CM | POA: Diagnosis not present

## 2011-05-05 DIAGNOSIS — H01009 Unspecified blepharitis unspecified eye, unspecified eyelid: Secondary | ICD-10-CM | POA: Diagnosis not present

## 2011-05-05 DIAGNOSIS — Z961 Presence of intraocular lens: Secondary | ICD-10-CM | POA: Diagnosis not present

## 2011-05-13 DIAGNOSIS — E785 Hyperlipidemia, unspecified: Secondary | ICD-10-CM | POA: Diagnosis not present

## 2011-05-15 ENCOUNTER — Ambulatory Visit (INDEPENDENT_AMBULATORY_CARE_PROVIDER_SITE_OTHER): Payer: Medicare Other | Admitting: Family Medicine

## 2011-05-15 ENCOUNTER — Encounter: Payer: Self-pay | Admitting: Family Medicine

## 2011-05-15 VITALS — BP 104/70 | HR 78 | Temp 98.0°F | Wt 294.0 lb

## 2011-05-15 DIAGNOSIS — J45901 Unspecified asthma with (acute) exacerbation: Secondary | ICD-10-CM | POA: Diagnosis not present

## 2011-05-15 MED ORDER — HYDROCODONE-HOMATROPINE 5-1.5 MG/5ML PO SYRP: 5.0000 mL | ORAL_SOLUTION | Freq: Three times a day (TID) | ORAL | Status: AC | PRN

## 2011-05-15 MED ORDER — PREDNISONE 20 MG PO TABS: ORAL_TABLET | ORAL | Status: DC

## 2011-05-15 MED ORDER — DOXYCYCLINE HYCLATE 100 MG PO TABS: 100.0000 mg | ORAL_TABLET | Freq: Two times a day (BID) | ORAL | Status: AC

## 2011-05-15 NOTE — Patient Instructions (Signed)
Cut back to 1/2 tab a day on the coumadin (1.25mg  per day now). Come back for recheck Monday.  Take prednisone with food.  Start the antibiotics today.  Bring all of your meds on Monday.   Take the cough syrup, sedation caution.

## 2011-05-15 NOTE — Progress Notes (Signed)
In midst of pred taper with resultant hyperglycemia that he covered with sliding scale.  He was using left over prednisone.  Still using inhalers.  Sx going on for about 1 week.  It started as a cold, getting worse with progressive cough and 'rattling' in chest.  No fevers since a few nights ago, had it one night.  Inc in cough, more sputum.  More sob than normal.  Fatigued.  No rash.  No ear pain.  No ST.    Meds, vitals, and allergies reviewed.   ROS: See HPI.  Otherwise, noncontributory.  nad ncat Tm w/o erythema Mmm rrr No inc in wob but + B ronchi and wheeze Ext well perfused and w/o cyanosis Trace edema

## 2011-05-17 NOTE — Assessment & Plan Note (Signed)
With ronchi and sputum production.  Start abx, continue pred taper for now and adjust insulin to sugar levels.  He agrees.  Nontoxic, okay for outpatient f/u.  F/u prn.  He agrees.  Continue inhalers.

## 2011-05-18 ENCOUNTER — Ambulatory Visit (INDEPENDENT_AMBULATORY_CARE_PROVIDER_SITE_OTHER): Payer: Medicare Other | Admitting: Family Medicine

## 2011-05-18 ENCOUNTER — Encounter: Payer: Self-pay | Admitting: Family Medicine

## 2011-05-18 VITALS — BP 120/78 | HR 74 | Temp 97.5°F | Ht 75.0 in | Wt 287.0 lb

## 2011-05-18 DIAGNOSIS — Z96659 Presence of unspecified artificial knee joint: Secondary | ICD-10-CM | POA: Diagnosis not present

## 2011-05-18 DIAGNOSIS — I824Y9 Acute embolism and thrombosis of unspecified deep veins of unspecified proximal lower extremity: Secondary | ICD-10-CM | POA: Diagnosis not present

## 2011-05-18 DIAGNOSIS — E669 Obesity, unspecified: Secondary | ICD-10-CM

## 2011-05-18 DIAGNOSIS — E119 Type 2 diabetes mellitus without complications: Secondary | ICD-10-CM | POA: Diagnosis not present

## 2011-05-18 DIAGNOSIS — Z5181 Encounter for therapeutic drug level monitoring: Secondary | ICD-10-CM | POA: Diagnosis not present

## 2011-05-18 DIAGNOSIS — J441 Chronic obstructive pulmonary disease with (acute) exacerbation: Secondary | ICD-10-CM

## 2011-05-18 DIAGNOSIS — E7800 Pure hypercholesterolemia, unspecified: Secondary | ICD-10-CM

## 2011-05-18 DIAGNOSIS — I4891 Unspecified atrial fibrillation: Secondary | ICD-10-CM

## 2011-05-18 DIAGNOSIS — Z7901 Long term (current) use of anticoagulants: Secondary | ICD-10-CM | POA: Diagnosis not present

## 2011-05-18 LAB — POCT INR: INR: 2

## 2011-05-18 MED ORDER — HYDROCODONE-HOMATROPINE 5-1.5 MG/5ML PO SYRP: ORAL_SOLUTION | ORAL | Status: AC

## 2011-05-18 MED ORDER — PREDNISONE 20 MG PO TABS: ORAL_TABLET | ORAL | Status: AC

## 2011-05-18 NOTE — Progress Notes (Signed)
Patient Name: [name] Date of Birth: Mar 20, 1940 Medical Record Number: [id number] Gender: male Date of Encounter: 05/18/2011  History of Present Illness:  [name] is a 71 y.o. very pleasant male patient who presents with the following:  Went to [place] last Tuesday and had a little bit of cold. Dx with bronchitis and COPD exac. Placed on Pred 20 mg, x 5 days. Now no improvement, using alb nebs, on prednisone 20 mg, still notably short of breath, much cough productive of sputum.  F/u obesity, pre-surgery:  Wt Readings from Last 3 Encounters:  05/18/11 287 lb (130.182 kg)  05/15/11 294 lb (133.358 kg)  04/16/11 291 lb (131.997 kg)   Body mass index is 35.87 kg/(m^2).  The patient has been sick twice recently and has been having a difficult time with exercise. He has been compliant with his diet, no significant fatty food intake, no sugars. 4 pound weight loss since 04/16/2011, 7 pounds since acute visit 05/15/2011.  Patient Active Problem List  Diagnoses  . HERPES ZOSTER W/NERVOUS COMPLICATION NEC  . DM  . HYPERCHOLESTEROLEMIA  . UNSPECIFIED ANEMIA  . ANXIETY  . DEPRESSION  . RESTLESS LEG SYNDROME  . HYPERTENSION  . ATRIAL FIBRILLATION, PAROXYSMAL  . ALLERGIC RHINITIS CAUSE UNSPECIFIED  . OTHER DISEASES OF NASAL CAVITY AND SINUSES  . ASTHMA, PERSISTENT, MILD  . GASTROPARESIS  . DIVERTICULOSIS, COLON  . IRRITABLE BOWEL SYNDROME  . RENAL INSUFFICIENCY  . HYDRONEPHROSIS, RIGHT  . LOW BACK PAIN, CHRONIC  . OSTEOPOROSIS, DRUG-INDUCED  . FOOT SURGERY, HX OF  . KNEE REPLACEMENT, LEFT, HX OF  . Acute venous embolism and thrombosis of deep vessels of proximal lower extremity  . Diverticulitis of colon  . Obesity, diabetes, and hypertension syndrome  . GERD (gastroesophageal reflux disease)  . Nonsurgical dumping syndrome  . Bacterial overgrowth syndrome  . Obesity  . Dyspnea  . Acute asthma exacerbation  . Chronic cough  . Preop pulmonary/respiratory  exam   Past Medical History  Diagnosis Date  . Diverticulosis of colon (without mention of hemorrhage)   . Other pulmonary embolism and infarction   . Restless legs syndrome (RLS)   . Obesity, unspecified   . Irritable bowel syndrome   . Gastroparesis   . Unspecified essential hypertension   . Lumbago   . Depressive disorder, not elsewhere classified   . Anxiety state, unspecified   . Unspecified asthma   . Esophageal reflux   . Atrial fibrillation   . Pure hypercholesterolemia   . Type II or unspecified type diabetes mellitus without mention of complication, not stated as uncontrolled   . Hemorrhoids    Past Surgical History  Procedure Date  . Back surgery     X 4   . Knee surgery     Bilateral x 6  . Artery repair     Left forearm  . Foot surgery     Left foot   . Replacement total knee     Left knee   . Breath tek h pylori 01/08/2011    Procedure: BREATH TEK H PYLORI;  Surgeon: Valarie Merino, MD;  Location: [facility];  Service: General;  Laterality: N/A;   History  Substance Use Topics  . Smoking status: Never Smoker   . Smokeless tobacco: Never Used  . Alcohol Use: No   Family History  Problem Relation Age of Onset  . Uterine cancer Mother     mets  . Breast cancer Mother   . Cancer  Mother     cervical  . Emphysema Father   . Alzheimer's disease Sister   . Prostate cancer Brother   . Heart attack Brother   . Colon cancer Brother 33   Allergies  Allergen Reactions  . Oxycodone   . Sulfacetamide Sodium     REACTION: Throat swelling    Medication list has been reviewed and updated.  Review of Systems: Cough, short of breath, wheezing. No CP Eating and drinking OK, AF  Physical Examination: Filed Vitals:   05/18/11 0919  BP: 120/78  Pulse: 74  Temp: 97.5 F (36.4 C)  TempSrc: Oral  Height: 6\' 3"  (1.905 m)  Weight: 287 lb (130.182 kg)  SpO2: 98%    Body mass index is 35.87 kg/(m^2).   GEN: A and O x 3. WDWN. NAD.    ENT: Nose  clear, ext NML.  No LAD.  No JVD.  TM's clear. Oropharynx clear.  PULM: mild increased WOB. Scattered wheezing without focal crackles. CV: RRR, no M/G/R, No rubs, No JVD.   EXT: warm and well-perfused, No c/c/e. PSYCH: Pleasant and conversant.   Assessment and Plan:  1. Obesity    2. DM    3. HYPERCHOLESTEROLEMIA    4. KNEE REPLACEMENT, LEFT, HX OF    5. COPD exacerbation  HYDROcodone-homatropine (HYCODAN) 5-1.5 MG/5ML syrup, predniSONE (DELTASONE) 20 MG tablet   Increase prednisone dose x 5 days at 40 mg, then 20 mg x 3 days  Cont doxycycline, recheck INR today  Remains an excellent candidate for bariatric surgery, complications as above, also with a history of CRI. Some difficulty with exercise, limited by acute illnesses in this month long interval.  Orders Today: No orders of the defined types were placed in this encounter.    Medications Today: Meds ordered this encounter  Medications  . HYDROcodone-homatropine (HYCODAN) 5-1.5 MG/5ML syrup    Sig: 1 tsp po at night before bed prn cough    Dispense:  240 mL    Refill:  0  . predniSONE (DELTASONE) 20 MG tablet    Sig: 2 tabs a day for 5 days, then 1 tab a day for 3 days    Dispense:  13 tablet    Refill:  0

## 2011-05-18 NOTE — Patient Instructions (Signed)
Continue current dose, check in 4 weeks  

## 2011-05-25 ENCOUNTER — Other Ambulatory Visit: Payer: Self-pay

## 2011-05-25 NOTE — Telephone Encounter (Signed)
Patient advised rx called to pharmacy.  

## 2011-05-25 NOTE — Telephone Encounter (Signed)
pts wife, Dondra Spry said pt saw Dr Patsy Lager on 05/18/11 and pt is better; has one more day of prednisone. Pt using nebulizer  Twice a day. Cough is now non productive, SOB is better and no fever. Pt is coughing during the day but worse at night and cannot sleep due to cough.Pt wants refill on Hycodan. Pt uses [facility] and call back # [phone].

## 2011-05-25 NOTE — Telephone Encounter (Signed)
This is very reasonable.  Hycodan suspension. 1 tsp po at night before bed prn cough 240 mL, 0 refills

## 2011-05-27 ENCOUNTER — Telehealth: Payer: Self-pay | Admitting: Family Medicine

## 2011-05-27 NOTE — Telephone Encounter (Signed)
Patient's wife advised

## 2011-05-27 NOTE — Telephone Encounter (Signed)
Triage Record Num: [id number] Operator: Jeraldine Loots Patient Name: [name] Call Date & Time: 05/27/2011 3:25:31PM Patient Phone: [phone] PCP: [name] Patient Gender: Male PCP Fax : [phone] Patient DOB: [birth year] Practice Name: [facility] Reason for Call: Caller: Maryruth Eve is calling with a question about his cough medication. The insurance will not pay for the additional medication. She is asking if there is another medication that could be called in. Hydrocodone is what was called in, this was the 3rd bottle called in. Pharmacy states that it is for 45 days. [facility]. Protocol(s) Used: Office Note Recommended Outcome per Protocol: Information Noted and Sent to Office Reason for Outcome: Caller information to office Care Advice: ~ 05/27/2011 3:29:30PM Page 1 of 1 [id number]

## 2011-05-27 NOTE — Telephone Encounter (Signed)
No - i am fine if he wants to take some plain robitussin - DM or delsym over the counter, but not more than this.

## 2011-06-11 ENCOUNTER — Ambulatory Visit (HOSPITAL_BASED_OUTPATIENT_CLINIC_OR_DEPARTMENT_OTHER): Payer: Medicare Other | Attending: Surgery | Admitting: Radiology

## 2011-06-11 VITALS — Ht 75.0 in | Wt 285.0 lb

## 2011-06-11 DIAGNOSIS — G4733 Obstructive sleep apnea (adult) (pediatric): Secondary | ICD-10-CM | POA: Insufficient documentation

## 2011-06-11 DIAGNOSIS — I4891 Unspecified atrial fibrillation: Secondary | ICD-10-CM | POA: Diagnosis not present

## 2011-06-11 DIAGNOSIS — E669 Obesity, unspecified: Secondary | ICD-10-CM

## 2011-06-11 DIAGNOSIS — N289 Disorder of kidney and ureter, unspecified: Secondary | ICD-10-CM | POA: Diagnosis not present

## 2011-06-13 DIAGNOSIS — R0609 Other forms of dyspnea: Secondary | ICD-10-CM | POA: Diagnosis not present

## 2011-06-13 DIAGNOSIS — G4733 Obstructive sleep apnea (adult) (pediatric): Secondary | ICD-10-CM | POA: Diagnosis not present

## 2011-06-13 DIAGNOSIS — R0989 Other specified symptoms and signs involving the circulatory and respiratory systems: Secondary | ICD-10-CM

## 2011-06-13 NOTE — Procedures (Signed)
NAMEFRAZER, RAINVILLE             ACCOUNT NO.:  [id number]  MEDICAL RECORD NO.:  [id number]          PATIENT TYPE:  OUT  LOCATION:  SLEEP CENTER                 FACILITY:  [facility]  PHYSICIAN:  Deziyah Arvin D. Maple Hudson, MD, FCCP, FACPDATE OF BIRTH:  November 06, 1941  DATE OF STUDY:  06/11/2011                           NOCTURNAL POLYSOMNOGRAM  REFERRING PHYSICIAN:  Thornton Park. Daphine Deutscher, MD  REFERRING PHYSICIAN:  Thornton Park. Daphine Deutscher, MD  INDICATION FOR STUDY:  Insomnia with sleep apnea.  EPWORTH SLEEPINESS SCORE:  7/24.  BMI 35.6, weight 285 pounds, height 75 inches, neck 16.5 inches.  MEDICATIONS:  Home medications are charted and reviewed.  SLEEP ARCHITECTURE:  Split study protocol.  During the diagnostic phase, total sleep time 121 minutes with sleep efficiency 92.7%.  Stage I was 16.9%, stage II 83.1%, stages III and REM were absent.  Sleep latency 4 minutes, awake after sleep onset 3.5 minutes, and arousal index 22.8.  Bedtime medication:  Tylenol was taken at 3:26 a.m. for headache.  RESPIRATORY DATA:  Split study protocol.  Apnea-hypopnea index (AHI) 29.3 per hour.  A total of 59 events was scored including 3 obstructive apneas, 1 central apneas, and 55 hypopneas.  He slept in a recliner chair and all events were recorded in this position.  CPAP was titrated to 8 CWP, AHI 46.2 per hour.  He complained of inability to exhale at that pressure and was changed to bilevel (BiPAP).  Final titration was to an inspiratory pressure of 20 with expiratory pressure of 16 CWP and residual AHI of 55 per hour.  He wore a large [employer] full face mask with heated humidifier.  OXYGEN DATA:  Before CPAP, snoring was moderate to loud with oxygen desaturation to a nadir of 79% on room air.  With CPAP titration, mean oxygen saturation held 96.2% on room air and snoring was prevented.  CARDIAC DATA:  Atrial fibrillation with ventricular response rate between 65 and 85 per minute.  Occasional wide  complexes, either PVC or aberrant conduction.  MOVEMENT-PARASOMNIA:  A few incidental limb jerks were noted with little effect on sleep.  Bathroom x1.  IMPRESSIONS-RECOMMENDATION: 1. The patient slept in a recliner chair as instructed.  There were     sustained intervals of wakefulness between midnight and 2 a.m. and     again between 3:15 and 4 a.m.  Tylenol was taken at 3:30 a.m. for     headaches. 2. Moderate to severe obstructive sleep apnea/hypopnea syndrome, apnea-     hypopnea index 29.3 per hour.  Moderate to loud snoring with oxygen     desaturation to a nadir of 79% on room air. 3. Continuous positive airway pressure was titrated to 8 cm of water     pressure with a residual apnea-hypopnea index of 51.4 per hour.  He     did not tolerate this CPAP pressure, complaining of difficulty with     exhalation.  He was changed to bilevel/BiPAP and titrated to an     inspiratory pressure of 20 and expiratory pressure of 16 cm of     water pressure .  This left residual central apneas     with an apnea-hypopnea index  of 55 per hour.  Better control was     demonstrated while wearing expiratory pressure 16 and expiratory     pressure 12, which gave an apnea-hypopnea index of 2.4 per hour.     This would be the suggested initial home pressure for trial, recognizing     there may be variable control.  He wore a large [employer] full face mask with heated humidifier.  Snoring was     prevented and mean oxygen saturation held 96.2% on BiPAP.     Rhodesia Stanger D. Maple Hudson, MD, [facility], FACP Diplomate, American Board of Sleep Medicine    CDY/MEDQ  D:  06/13/2011 12:39:18  T:  06/13/2011 16:26:52  Job:  [id number]

## 2011-06-15 ENCOUNTER — Ambulatory Visit (INDEPENDENT_AMBULATORY_CARE_PROVIDER_SITE_OTHER): Payer: Medicare Other | Admitting: Family Medicine

## 2011-06-15 DIAGNOSIS — I824Y9 Acute embolism and thrombosis of unspecified deep veins of unspecified proximal lower extremity: Secondary | ICD-10-CM | POA: Diagnosis not present

## 2011-06-15 DIAGNOSIS — Z7901 Long term (current) use of anticoagulants: Secondary | ICD-10-CM | POA: Diagnosis not present

## 2011-06-15 DIAGNOSIS — I4891 Unspecified atrial fibrillation: Secondary | ICD-10-CM | POA: Diagnosis not present

## 2011-06-15 DIAGNOSIS — Z5181 Encounter for therapeutic drug level monitoring: Secondary | ICD-10-CM

## 2011-06-15 NOTE — Patient Instructions (Addendum)
Continue 2.5 mg daily recheck  4 weeks, eat more greens

## 2011-06-18 ENCOUNTER — Ambulatory Visit: Payer: Medicare Other | Admitting: Family Medicine

## 2011-06-26 DIAGNOSIS — M545 Low back pain: Secondary | ICD-10-CM | POA: Diagnosis not present

## 2011-06-30 ENCOUNTER — Other Ambulatory Visit: Payer: Self-pay | Admitting: Family Medicine

## 2011-06-30 ENCOUNTER — Other Ambulatory Visit: Payer: Self-pay

## 2011-06-30 MED ORDER — METOPROLOL SUCCINATE ER 25 MG PO TB24: 25.0000 mg | ORAL_TABLET | Freq: Two times a day (BID) | ORAL | Status: DC

## 2011-06-30 MED ORDER — OMEPRAZOLE 40 MG PO CPDR: 40.0000 mg | DELAYED_RELEASE_CAPSULE | Freq: Two times a day (BID) | ORAL | Status: DC

## 2011-06-30 NOTE — Telephone Encounter (Signed)
[facility] faxed request omeprazole 40 mg # 180 x 3 and toprol xl 25 mg #180 x 3.

## 2011-06-30 NOTE — Telephone Encounter (Signed)
pts wife left v/m omeprazole 40 mg  30 day supply # 60 x 0 to [facility] while waiting on mail order. Left v/m for pt to call back.

## 2011-06-30 NOTE — Telephone Encounter (Signed)
Patient's wife notified as instructed by telephone. 

## 2011-07-04 ENCOUNTER — Other Ambulatory Visit: Payer: Self-pay | Admitting: Family Medicine

## 2011-07-10 ENCOUNTER — Ambulatory Visit: Payer: Medicare Other | Admitting: Family Medicine

## 2011-07-13 ENCOUNTER — Ambulatory Visit: Payer: Medicare Other

## 2011-07-14 ENCOUNTER — Other Ambulatory Visit: Payer: Self-pay | Admitting: Family Medicine

## 2011-07-15 ENCOUNTER — Ambulatory Visit (INDEPENDENT_AMBULATORY_CARE_PROVIDER_SITE_OTHER): Payer: Medicare Other | Admitting: Family Medicine

## 2011-07-15 DIAGNOSIS — I824Y9 Acute embolism and thrombosis of unspecified deep veins of unspecified proximal lower extremity: Secondary | ICD-10-CM | POA: Diagnosis not present

## 2011-07-15 DIAGNOSIS — I4891 Unspecified atrial fibrillation: Secondary | ICD-10-CM | POA: Diagnosis not present

## 2011-07-15 DIAGNOSIS — Z7901 Long term (current) use of anticoagulants: Secondary | ICD-10-CM | POA: Diagnosis not present

## 2011-07-15 DIAGNOSIS — Z5181 Encounter for therapeutic drug level monitoring: Secondary | ICD-10-CM | POA: Diagnosis not present

## 2011-07-15 NOTE — Patient Instructions (Signed)
Decrease to  2.5 mg daily except 1.25 mg wed, sat  recheck 2 weeks

## 2011-07-22 DIAGNOSIS — E1142 Type 2 diabetes mellitus with diabetic polyneuropathy: Secondary | ICD-10-CM | POA: Diagnosis not present

## 2011-07-22 DIAGNOSIS — I10 Essential (primary) hypertension: Secondary | ICD-10-CM | POA: Diagnosis not present

## 2011-07-22 DIAGNOSIS — E785 Hyperlipidemia, unspecified: Secondary | ICD-10-CM | POA: Diagnosis not present

## 2011-07-22 DIAGNOSIS — E1149 Type 2 diabetes mellitus with other diabetic neurological complication: Secondary | ICD-10-CM | POA: Diagnosis not present

## 2011-07-22 DIAGNOSIS — E669 Obesity, unspecified: Secondary | ICD-10-CM | POA: Diagnosis not present

## 2011-07-23 ENCOUNTER — Encounter: Payer: Medicare Other | Attending: Surgery | Admitting: *Deleted

## 2011-07-23 DIAGNOSIS — Z01818 Encounter for other preprocedural examination: Secondary | ICD-10-CM | POA: Insufficient documentation

## 2011-07-23 DIAGNOSIS — E669 Obesity, unspecified: Secondary | ICD-10-CM

## 2011-07-23 DIAGNOSIS — Z713 Dietary counseling and surveillance: Secondary | ICD-10-CM | POA: Diagnosis not present

## 2011-07-23 NOTE — Progress Notes (Addendum)
  Bariatric Class:  Appt start time: 0830 end time:  0930.  Pre-Operative Nutrition Class  Patient was seen on 07/23/2011 for Pre-Operative Bariatric Surgery Education at the [facility]  Surgery date: 08/11/11 Surgery type: RYGB  Samples given per MNT protocol: Bariatric Advantage Multivitamin Lot # [id number] Exp: 09/13  Bariatric Advantage Calcium Citrate Lot # [id number] Exp: 09/13  Celebrate Vitamins Multivitamin Complete - Lot # [id number]; Exp: 11/14 Multivitamin - Lot # [id number]; Exp: 07/14  Celebrate Vitamins Iron 30 mg +C Lot # [id number] Exp:  07/14  Corliss Marcus Protein Powder Lot # [id number] Exp: 09/14  The following the learning objective met by the patient during this course:   Identifies Pre-Op Dietary Goals and will begin 2 weeks pre-operatively   Identifies appropriate sources of fluids and proteins   States protein recommendations and appropriate sources pre and post-operatively  Identifies Post-Operative Dietary Goals and will follow for 2 weeks post-operatively  Identifies appropriate multivitamin and calcium sources  Describes the need for physical activity post-operatively and will follow MD recommendations  States when to call healthcare provider regarding medication questions or post-operative complications  Handouts given during class include:  Pre-Op Bariatric Surgery Diet Handout  Protein Shake Handout  Post-Op Bariatric Surgery Nutrition Handout  BELT Program Information Flyer  Support Group Information Flyer  Follow-Up Plan: Patient will follow-up at [facility] 2 weeks post operatively for diet advancement per MD.

## 2011-07-23 NOTE — Patient Instructions (Addendum)
Follow:   Pre-Op Diet per MD 2 weeks prior to surgery  Phase 2- Liquids (clear/full) 2 weeks after surgery  Vitamin/Mineral/Calcium guidelines for purchasing bariatric supplements  Exercise guidelines pre and post-op per MD  Follow-up at [facility] in 2 weeks post-op for diet advancement. Contact Nikolaos Maddocks as needed with questions/concerns. 

## 2011-07-27 ENCOUNTER — Telehealth: Payer: Self-pay | Admitting: Family Medicine

## 2011-07-27 NOTE — Telephone Encounter (Signed)
Caller: Henreitta Leber; PCP: [name] E.;Call regarding Husband  Scheduled for Bariatric Surgery On August 11, 2011. Need To Know He Should Stop His Coumadin and Or Any Other Medications He Is On.  Appointment On Wednesday 07/29/11 for PT/INR in the Office. PLEASE CALL BACK  CB#: [phone].

## 2011-07-28 NOTE — Telephone Encounter (Signed)
Advised patient as instructed. 

## 2011-07-28 NOTE — Telephone Encounter (Signed)
Have pt hold coumadin 5 days prior to procedure. Continue other meds.

## 2011-07-29 ENCOUNTER — Ambulatory Visit (INDEPENDENT_AMBULATORY_CARE_PROVIDER_SITE_OTHER): Payer: Medicare Other | Admitting: Family Medicine

## 2011-07-29 DIAGNOSIS — Z5181 Encounter for therapeutic drug level monitoring: Secondary | ICD-10-CM

## 2011-07-29 DIAGNOSIS — I4891 Unspecified atrial fibrillation: Secondary | ICD-10-CM | POA: Diagnosis not present

## 2011-07-29 DIAGNOSIS — I824Y9 Acute embolism and thrombosis of unspecified deep veins of unspecified proximal lower extremity: Secondary | ICD-10-CM

## 2011-07-29 DIAGNOSIS — Z7901 Long term (current) use of anticoagulants: Secondary | ICD-10-CM | POA: Diagnosis not present

## 2011-07-29 NOTE — Patient Instructions (Signed)
ncrease to  2.5 mg daily , having surgery in 2 weeks, will d/c coumadin in 1 week, pt will call for appt after surgery

## 2011-07-30 ENCOUNTER — Encounter (HOSPITAL_COMMUNITY): Payer: Self-pay | Admitting: Pharmacy Technician

## 2011-07-31 DIAGNOSIS — M545 Low back pain: Secondary | ICD-10-CM | POA: Diagnosis not present

## 2011-08-07 ENCOUNTER — Encounter (HOSPITAL_COMMUNITY): Payer: Self-pay

## 2011-08-07 ENCOUNTER — Ambulatory Visit (INDEPENDENT_AMBULATORY_CARE_PROVIDER_SITE_OTHER): Payer: Medicare Other | Admitting: Surgery

## 2011-08-07 ENCOUNTER — Encounter (HOSPITAL_COMMUNITY)
Admission: RE | Admit: 2011-08-07 | Discharge: 2011-08-07 | Disposition: A | Discharge status: Home / Self Care | Payer: Medicare Other | Source: Ambulatory Visit | Attending: Surgery | Admitting: Surgery

## 2011-08-07 ENCOUNTER — Encounter (INDEPENDENT_AMBULATORY_CARE_PROVIDER_SITE_OTHER): Payer: Self-pay | Admitting: Surgery

## 2011-08-07 VITALS — BP 122/82 | HR 72 | Temp 97.2°F | Resp 18 | Ht 75.0 in | Wt 284.5 lb

## 2011-08-07 DIAGNOSIS — E119 Type 2 diabetes mellitus without complications: Secondary | ICD-10-CM

## 2011-08-07 DIAGNOSIS — E1159 Type 2 diabetes mellitus with other circulatory complications: Secondary | ICD-10-CM

## 2011-08-07 HISTORY — DX: Personal history of other diseases of the musculoskeletal system and connective tissue: Z87.39

## 2011-08-07 LAB — SURGICAL PCR SCREEN
MRSA, PCR: NEGATIVE
Staphylococcus aureus: NEGATIVE

## 2011-08-07 LAB — CBC
MCHC: 33.3 g/dL (ref 30.0–36.0)
Platelets: 230 10*3/uL (ref 150–400)
RDW: 14.2 % (ref 11.5–15.5)

## 2011-08-07 LAB — BASIC METABOLIC PANEL
BUN: 33 mg/dL — ABNORMAL HIGH (ref 6–23)
GFR calc Af Amer: 48 mL/min — ABNORMAL LOW (ref >90)
GFR calc non Af Amer: 42 mL/min — ABNORMAL LOW (ref >90)
Potassium: 5.3 mEq/L — ABNORMAL HIGH (ref 3.5–5.1)
Sodium: 134 mEq/L — ABNORMAL LOW (ref 135–145)

## 2011-08-07 LAB — PROTIME-INR
INR: 1.26 (ref 0.00–1.49)
Prothrombin Time: 16.1 seconds — ABNORMAL HIGH (ref 11.6–15.2)

## 2011-08-07 NOTE — Patient Instructions (Addendum)

## 2011-08-07 NOTE — Progress Notes (Signed)
Chief Complaint:  I don't want to take all of these medicines anymore  History of Present Illness:  [name] is an 71 y.o. male who comes in with his wife before his roux y gastric bypass scheduled 08/11/11.  He is aware of the risk and benefits of the procedure. He comes in today with a weight of 284.8. He's had metabolic syndrome for greater than 5 years and is followed by Dr. Linton Rump bed so have a [employer]. On upper GI he does have a small hiatal hernia and some reflux. Ultrasound showed no evidence of gallstones or gallbladder wall thickening.  I gave him a bowel prep and discussed the surgery upcoming. He is doing well on the diet and has noticed some changes in his diabetes management. All questions were answered and he is ready for surgery next week.  Past Medical History  Diagnosis Date  . Diverticulosis of colon (without mention of hemorrhage)   . Other pulmonary embolism and infarction   . Restless legs syndrome (RLS)   . Obesity, unspecified   . Irritable bowel syndrome   . Gastroparesis   . Unspecified essential hypertension   . Lumbago   . Depressive disorder, not elsewhere classified   . Anxiety state, unspecified   . Unspecified asthma   . Esophageal reflux   . Atrial fibrillation   . Pure hypercholesterolemia   . Type II or unspecified type diabetes mellitus without mention of complication, not stated as uncontrolled   . Hemorrhoids     Past Surgical History  Procedure Date  . Back surgery     X 4   . Knee surgery     Bilateral x 6  . Artery repair     Left forearm  . Foot surgery     Left foot   . Replacement total knee     Left knee   . Breath tek h pylori 01/08/2011    Procedure: BREATH TEK H PYLORI;  Surgeon: Valarie Merino, MD;  Location: [facility];  Service: General;  Laterality: N/A;    Current Outpatient Prescriptions  Medication Sig Dispense Refill  . albuterol (PROVENTIL) (2.5 MG/3ML) 0.083% nebulizer solution Take 2.5 mg by  nebulization every 6 (six) hours as needed. WHEEZING AND SHORTNESS OF BREATH      . atorvastatin (LIPITOR) 80 MG tablet Take 1 tablet (80 mg total) by mouth daily.  90 tablet  3  . Bepotastine Besilate (BEPREVE) 1.5 % SOLN Place 1 drop into the left eye as needed. ONLY USES IF HIS EYE IS ITCHING BAD      . fenofibrate 160 MG tablet Take 1 tablet (160 mg total) by mouth daily.  90 tablet  3  . fenofibrate 160 MG tablet TAKE 1 TABLET BY MOUTH DAILY.  30 tablet  9  . ferrous sulfate 325 (65 FE) MG tablet Take 325 mg by mouth 2 (two) times daily. Take one tablet twice daily      . FLUoxetine (PROZAC) 40 MG capsule Take 1 capsule (40 mg total) by mouth daily.  90 capsule  3  . furosemide (LASIX) 20 MG tablet 1 tab po daily prn peripheral swelling  90 tablet  3  . gabapentin (NEURONTIN) 600 MG tablet Take 1,200 mg by mouth 3 (three) times daily.      [name] HYDROcodone-acetaminophen (NORCO) 10-325 MG per tablet Take 1 tablet by mouth every 4 (four) hours as needed. PAIN      . insulin aspart (NOVOLOG FLEXPEN)  100 UNIT/ML injection Inject 20-27 Units into the skin 3 (three) times daily.       . insulin detemir (LEVEMIR) 100 UNIT/ML injection Inject 70 Units into the skin daily.       [name] loteprednol (LOTEMAX) 0.2 % SUSP Place 1 drop into both eyes 2 (two) times daily.      . metoprolol succinate (TOPROL-XL) 25 MG 24 hr tablet Take 1 tablet (25 mg total) by mouth 2 (two) times daily.  180 tablet  3  . omeprazole (PRILOSEC) 40 MG capsule Take 1 capsule (40 mg total) by mouth 2 (two) times daily.  180 capsule  3  . topiramate (TOPAMAX) 25 MG tablet TAKE 3 TABLETS (75 MG TOTAL) BY MOUTH AT BEDTIME.  90 tablet  3  . warfarin (COUMADIN) 2.5 MG tablet Take 1 tablet (2.5 mg total) by mouth daily. As directed   90 tablet  3   Sulfacetamide sodium Family History  Problem Relation Age of Onset  . Uterine cancer Mother     mets  . Breast cancer Mother   . Cancer Mother     cervical  . Emphysema Father   .  Alzheimer's disease Sister   . Prostate cancer Brother   . Heart attack Brother   . Colon cancer Brother 61   Social History:   reports that he has never smoked. He has never used smokeless tobacco. He reports that he does not drink alcohol or use illicit drugs.   REVIEW OF SYSTEMS - PERTINENT POSITIVES ONLY: noncontributory  Physical Exam:   Blood pressure 122/82, pulse 72, temperature 97.2 F (36.2 C), temperature source Temporal, resp. rate 18, height 6\' 3"  (1.905 m), weight 284 lb 8 oz (129.048 kg). Body mass index is 35.56 kg/(m^2).  Gen:  WDWN WM NAD  Neurological: Alert and oriented to person, place, and time. Motor and sensory function is grossly intact  Head: Normocephalic and atraumatic.  Eyes: Conjunctivae are normal. Pupils are equal, round, and reactive to light. No scleral icterus.  Neck: Normal range of motion. Neck supple. No tracheal deviation or thyromegaly present.  Cardiovascular:  SR without murmurs or gallops.  No carotid bruits Respiratory: Effort normal.  No respiratory distress. No chest wall tenderness. Breath sounds normal.  No wheezes, rales or rhonchi.  Abdomen:  Panniculus noted and moderate abdominal fat.  Nontender. GU: Musculoskeletal: Normal range of motion. Extremities are nontender. No cyanosis, edema or clubbing noted Lymphadenopathy: No cervical, preauricular, postauricular or axillary adenopathy is present Skin: Skin is warm and dry. No rash noted. No diaphoresis. No erythema. No pallor. Pscyh: Normal mood and affect. Behavior is normal. Judgment and thought content normal.   LABORATORY RESULTS: No results found for this or any previous visit (from the past 48 hour(s)).  RADIOLOGY RESULTS: No results found.  Problem List: Patient Active Problem List  Diagnosis  . HERPES ZOSTER W/NERVOUS COMPLICATION NEC  . DM  . HYPERCHOLESTEROLEMIA  . UNSPECIFIED ANEMIA  . ANXIETY  . DEPRESSION  . RESTLESS LEG SYNDROME  . HYPERTENSION  . ATRIAL  FIBRILLATION, PAROXYSMAL  . ALLERGIC RHINITIS CAUSE UNSPECIFIED  . OTHER DISEASES OF NASAL CAVITY AND SINUSES  . ASTHMA, PERSISTENT, MILD  . GASTROPARESIS  . DIVERTICULOSIS, COLON  . IRRITABLE BOWEL SYNDROME  . RENAL INSUFFICIENCY  . HYDRONEPHROSIS, RIGHT  . LOW BACK PAIN, CHRONIC  . OSTEOPOROSIS, DRUG-INDUCED  . FOOT SURGERY, HX OF  . KNEE REPLACEMENT, LEFT, HX OF  . Acute venous embolism and thrombosis of deep vessels of  proximal lower extremity  . Diverticulitis of colon  . Obesity, diabetes, and hypertension syndrome  . GERD (gastroesophageal reflux disease)  . Nonsurgical dumping syndrome  . Bacterial overgrowth syndrome  . Obesity  . Dyspnea  . Acute asthma exacerbation  . Chronic cough  . Preop pulmonary/respiratory exam    Assessment & Plan: Morbid obesity and diabetes mellitus2  Plan laparoscopic Roux-en-Y gastric bypass    [name], MD, [facility] [phone] beeper [phone]  08/07/2011 10:45 AM

## 2011-08-07 NOTE — Patient Instructions (Addendum)
21 [name]  08/07/2011   Your procedure is scheduled on:  08/11/11 at 10:00 am  Report to SHORT STAY DEPT  at 7:30 AM.  Call this number if you have problems the morning of surgery: [phone]   Remember:   Do not eat food or drink liquids AFTER MIDNIGHT    Take these medicines the morning of surgery with A SIP OF WATER: FLUOXATINE / GABAPENTIN / METOPROLOL / OMEPRAZOLE / HYDROCODONE IF NEEDED / USE EYE DROPS AS USUAL   Do not wear jewelry, make-up or nail polish.  Do not wear lotions, powders, or perfumes.   Do not shave legs or underarms 48 hrs. before surgery (men may shave face)  Do not bring valuables to the hospital.  Contacts, dentures or bridgework may not be worn into surgery.  Leave suitcase in the car. After surgery it may be brought to your room.  For patients admitted to the hospital, checkout time is 11:00 AM the day of discharge.   Patients discharged the day of surgery will not be allowed to drive home.    Special Instructions:   Please read over the following fact sheets that you were given: MRSA  Information               SHOWER WITH BETASEPT THE NIGHT BEFORE SURGERY AND THE MORNING OF SURGERY                  FOLLOW BOWEL PREP FROM OFFICE

## 2011-08-07 NOTE — Progress Notes (Signed)
08/07/11 1420  OBSTRUCTIVE SLEEP APNEA  Have you ever been diagnosed with sleep apnea through a sleep study? No  Do you snore loudly (loud enough to be heard through closed doors)?  0  Do you often feel tired, fatigued, or sleepy during the daytime? 1  Has anyone observed you stop breathing during your sleep? 0  Do you have, or are you being treated for high blood pressure? 1  BMI more than 35 kg/m2? 1  Age over 70 years old? 1  Neck circumference greater than 40 cm/18 inches? 1  Gender: 1  Obstructive Sleep Apnea Score 6   Score 4 or greater  Updated health history;Results sent to PCP

## 2011-08-11 ENCOUNTER — Encounter (HOSPITAL_COMMUNITY): Payer: Self-pay | Admitting: Anesthesiology

## 2011-08-11 ENCOUNTER — Encounter (HOSPITAL_COMMUNITY): Payer: Self-pay | Admitting: *Deleted

## 2011-08-11 ENCOUNTER — Inpatient Hospital Stay (HOSPITAL_COMMUNITY)
Admission: RE | Admit: 2011-08-11 | Discharge: 2011-08-14 | DRG: 620 | Disposition: A | Discharge status: Home / Self Care | Payer: Medicare Other | Source: Ambulatory Visit | Attending: Surgery | Admitting: Surgery

## 2011-08-11 ENCOUNTER — Ambulatory Visit (HOSPITAL_COMMUNITY): Payer: Medicare Other | Admitting: Anesthesiology

## 2011-08-11 ENCOUNTER — Encounter (HOSPITAL_COMMUNITY)
Admission: RE | Disposition: A | Discharge status: Home / Self Care | Payer: Self-pay | Source: Ambulatory Visit | Attending: Surgery

## 2011-08-11 DIAGNOSIS — K219 Gastro-esophageal reflux disease without esophagitis: Secondary | ICD-10-CM | POA: Diagnosis present

## 2011-08-11 DIAGNOSIS — I517 Cardiomegaly: Secondary | ICD-10-CM

## 2011-08-11 DIAGNOSIS — Z9884 Bariatric surgery status: Secondary | ICD-10-CM | POA: Diagnosis not present

## 2011-08-11 DIAGNOSIS — Z01812 Encounter for preprocedural laboratory examination: Secondary | ICD-10-CM

## 2011-08-11 DIAGNOSIS — I4891 Unspecified atrial fibrillation: Secondary | ICD-10-CM | POA: Diagnosis present

## 2011-08-11 DIAGNOSIS — Z6835 Body mass index (BMI) 35.0-35.9, adult: Secondary | ICD-10-CM

## 2011-08-11 DIAGNOSIS — G4733 Obstructive sleep apnea (adult) (pediatric): Secondary | ICD-10-CM | POA: Diagnosis present

## 2011-08-11 DIAGNOSIS — I129 Hypertensive chronic kidney disease with stage 1 through stage 4 chronic kidney disease, or unspecified chronic kidney disease: Secondary | ICD-10-CM | POA: Diagnosis present

## 2011-08-11 DIAGNOSIS — J4489 Other specified chronic obstructive pulmonary disease: Secondary | ICD-10-CM | POA: Diagnosis present

## 2011-08-11 DIAGNOSIS — I10 Essential (primary) hypertension: Secondary | ICD-10-CM

## 2011-08-11 DIAGNOSIS — J449 Chronic obstructive pulmonary disease, unspecified: Secondary | ICD-10-CM | POA: Diagnosis present

## 2011-08-11 DIAGNOSIS — Z09 Encounter for follow-up examination after completed treatment for conditions other than malignant neoplasm: Secondary | ICD-10-CM | POA: Diagnosis not present

## 2011-08-11 DIAGNOSIS — Z7901 Long term (current) use of anticoagulants: Secondary | ICD-10-CM

## 2011-08-11 DIAGNOSIS — N183 Chronic kidney disease, stage 3 unspecified: Secondary | ICD-10-CM | POA: Diagnosis present

## 2011-08-11 DIAGNOSIS — E871 Hypo-osmolality and hyponatremia: Secondary | ICD-10-CM | POA: Diagnosis not present

## 2011-08-11 DIAGNOSIS — E119 Type 2 diabetes mellitus without complications: Secondary | ICD-10-CM | POA: Diagnosis not present

## 2011-08-11 HISTORY — PX: GASTRIC ROUX-EN-Y: SHX5262

## 2011-08-11 LAB — BASIC METABOLIC PANEL
GFR calc Af Amer: 48 mL/min — ABNORMAL LOW (ref >90)
GFR calc non Af Amer: 42 mL/min — ABNORMAL LOW (ref >90)
Potassium: 4.3 mEq/L (ref 3.5–5.1)
Sodium: 134 mEq/L — ABNORMAL LOW (ref 135–145)

## 2011-08-11 LAB — CARDIAC PANEL(CRET KIN+CKTOT+MB+TROPI)
CK, MB: 1.1 ng/mL (ref 0.3–4.0)
Relative Index: INVALID (ref 0.0–2.5)
Troponin I: <0.3 ng/mL (ref <0.30)

## 2011-08-11 LAB — GLUCOSE, CAPILLARY

## 2011-08-11 LAB — HEMOGLOBIN AND HEMATOCRIT, BLOOD: HCT: 40.1 % (ref 39.0–52.0)

## 2011-08-11 LAB — PROTIME-INR: Prothrombin Time: 15.4 seconds — ABNORMAL HIGH (ref 11.6–15.2)

## 2011-08-11 LAB — CBC
Platelets: 189 10*3/uL (ref 150–400)
RBC: 4.34 MIL/uL (ref 4.22–5.81)
RDW: 14.2 % (ref 11.5–15.5)
WBC: 9.7 10*3/uL (ref 4.0–10.5)

## 2011-08-11 SURGERY — LAPAROSCOPIC ROUX-EN-Y GASTRIC BYPASS WITH UPPER ENDOSCOPY
Anesthesia: General | Site: Abdomen | Wound class: Clean Contaminated

## 2011-08-11 MED ORDER — BUPIVACAINE LIPOSOME 1.3 % IJ SUSP
20.0000 mL | Freq: Once | INTRAMUSCULAR | Status: DC
  Filled 2011-08-11: qty 20

## 2011-08-11 MED ORDER — LACTATED RINGERS IV SOLN: INTRAVENOUS | Status: DC

## 2011-08-11 MED ORDER — UNJURY CHOCOLATE CLASSIC POWDER
2.0000 [oz_av] | Freq: Four times a day (QID) | ORAL | Status: DC
  Administered 2011-08-13: 2 [oz_av] via ORAL

## 2011-08-11 MED ORDER — METOPROLOL TARTRATE 1 MG/ML IV SOLN
5.0000 mg | Freq: Four times a day (QID) | INTRAVENOUS | Status: DC | PRN
  Administered 2011-08-12: 5 mg via INTRAVENOUS
  Filled 2011-08-11 (×2): qty 5

## 2011-08-11 MED ORDER — ONDANSETRON HCL 4 MG/2ML IJ SOLN
4.0000 mg | INTRAMUSCULAR | Status: DC | PRN
  Administered 2011-08-11 (×2): 4 mg via INTRAVENOUS
  Filled 2011-08-11 (×2): qty 2

## 2011-08-11 MED ORDER — PHENYLEPHRINE HCL 10 MG/ML IJ SOLN
10.0000 mg | INTRAMUSCULAR | Status: DC | PRN
  Administered 2011-08-11: 20 ug/min via INTRAVENOUS

## 2011-08-11 MED ORDER — LACTATED RINGERS IV SOLN
INTRAVENOUS | Status: DC | PRN
  Administered 2011-08-11 (×3): via INTRAVENOUS

## 2011-08-11 MED ORDER — ALBUTEROL SULFATE (5 MG/ML) 0.5% IN NEBU
INHALATION_SOLUTION | RESPIRATORY_TRACT | Status: AC
  Filled 2011-08-11: qty 0.5

## 2011-08-11 MED ORDER — PROPOFOL 10 MG/ML IV EMUL
INTRAVENOUS | Status: DC | PRN
  Administered 2011-08-11: 220 mg via INTRAVENOUS

## 2011-08-11 MED ORDER — BEPOTASTINE BESILATE 1.5 % OP SOLN: 1.0000 [drp] | OPHTHALMIC | Status: DC | PRN

## 2011-08-11 MED ORDER — CEFOXITIN SODIUM-DEXTROSE 1-4 GM-% IV SOLR (PREMIX)
INTRAVENOUS | Status: AC
  Filled 2011-08-11: qty 100

## 2011-08-11 MED ORDER — TISSEEL VH 10 ML EX KIT
PACK | CUTANEOUS | Status: DC | PRN
  Administered 2011-08-11: 10 mL

## 2011-08-11 MED ORDER — LOTEPREDNOL ETABONATE 0.2 % OP SUSP
1.0000 [drp] | Freq: Two times a day (BID) | OPHTHALMIC | Status: DC
  Administered 2011-08-12 – 2011-08-14 (×5): 1 [drp] via OPHTHALMIC

## 2011-08-11 MED ORDER — SUCCINYLCHOLINE CHLORIDE 20 MG/ML IJ SOLN
INTRAMUSCULAR | Status: DC | PRN
  Administered 2011-08-11: 120 mg via INTRAVENOUS

## 2011-08-11 MED ORDER — FIBRIN SEALANT COMPONENT 5 ML EX KIT
PACK | CUTANEOUS | Status: AC
  Filled 2011-08-11: qty 2

## 2011-08-11 MED ORDER — NEOSTIGMINE METHYLSULFATE 1 MG/ML IJ SOLN
INTRAMUSCULAR | Status: DC | PRN
  Administered 2011-08-11: 5 mg via INTRAVENOUS

## 2011-08-11 MED ORDER — BIOTENE DRY MOUTH MT LIQD
15.0000 mL | Freq: Two times a day (BID) | OROMUCOSAL | Status: DC
  Administered 2011-08-11 – 2011-08-14 (×6): 15 mL via OROMUCOSAL

## 2011-08-11 MED ORDER — PHENYLEPHRINE HCL 10 MG/ML IJ SOLN
INTRAMUSCULAR | Status: DC | PRN
  Administered 2011-08-11: 80 ug via INTRAVENOUS

## 2011-08-11 MED ORDER — METOPROLOL TARTRATE 25 MG PO TABS
25.0000 mg | ORAL_TABLET | Freq: Once | ORAL | Status: AC
  Administered 2011-08-11: 25 mg via ORAL
  Filled 2011-08-11: qty 1

## 2011-08-11 MED ORDER — HYDROMORPHONE HCL PF 1 MG/ML IJ SOLN
0.2500 mg | INTRAMUSCULAR | Status: DC | PRN
  Administered 2011-08-11 (×2): 0.25 mg via INTRAVENOUS

## 2011-08-11 MED ORDER — INSULIN ASPART 100 UNIT/ML ~~LOC~~ SOLN
0.0000 [IU] | SUBCUTANEOUS | Status: DC
  Administered 2011-08-11: 3 [IU] via SUBCUTANEOUS
  Administered 2011-08-12 (×2): 4 [IU] via SUBCUTANEOUS
  Administered 2011-08-12: 3 [IU] via SUBCUTANEOUS
  Administered 2011-08-12: 4 [IU] via SUBCUTANEOUS
  Administered 2011-08-12: 3 [IU] via SUBCUTANEOUS
  Administered 2011-08-12: 4 [IU] via SUBCUTANEOUS
  Administered 2011-08-13 – 2011-08-14 (×7): 3 [IU] via SUBCUTANEOUS

## 2011-08-11 MED ORDER — SODIUM CHLORIDE 0.9 % IV SOLN
INTRAVENOUS | Status: DC | PRN
  Administered 2011-08-11: 12:00:00 via INTRAVENOUS

## 2011-08-11 MED ORDER — OXYCODONE-ACETAMINOPHEN 5-325 MG/5ML PO SOLN: 5.0000 mL | ORAL | Status: DC | PRN

## 2011-08-11 MED ORDER — ALBUTEROL SULFATE (5 MG/ML) 0.5% IN NEBU
2.5000 mg | INHALATION_SOLUTION | Freq: Four times a day (QID) | RESPIRATORY_TRACT | Status: DC
  Administered 2011-08-11 – 2011-08-12 (×6): 2.5 mg via RESPIRATORY_TRACT
  Filled 2011-08-11 (×5): qty 0.5

## 2011-08-11 MED ORDER — GLYCOPYRROLATE 0.2 MG/ML IJ SOLN
INTRAMUSCULAR | Status: DC | PRN
  Administered 2011-08-11: 0.6 mg via INTRAVENOUS

## 2011-08-11 MED ORDER — ESMOLOL HCL-SODIUM CHLORIDE 2000 MG/100ML IV SOLN
25.0000 ug/kg/min | INTRAVENOUS | Status: DC
  Administered 2011-08-11: 49.921 ug/kg/min via INTRAVENOUS
  Filled 2011-08-11: qty 100

## 2011-08-11 MED ORDER — UNJURY VANILLA POWDER
2.0000 [oz_av] | Freq: Four times a day (QID) | ORAL | Status: DC
  Administered 2011-08-13 – 2011-08-14 (×2): 2 [oz_av] via ORAL

## 2011-08-11 MED ORDER — ACETAMINOPHEN 10 MG/ML IV SOLN
INTRAVENOUS | Status: DC | PRN
  Administered 2011-08-11: 1000 mg via INTRAVENOUS

## 2011-08-11 MED ORDER — POTASSIUM CHLORIDE IN NACL 20-0.45 MEQ/L-% IV SOLN
INTRAVENOUS | Status: DC
  Administered 2011-08-11: 17:00:00 via INTRAVENOUS
  Administered 2011-08-12: 1000 mL via INTRAVENOUS
  Administered 2011-08-12 – 2011-08-14 (×5): via INTRAVENOUS
  Filled 2011-08-11 (×11): qty 1000

## 2011-08-11 MED ORDER — LABETALOL HCL 5 MG/ML IV SOLN
INTRAVENOUS | Status: DC | PRN
  Administered 2011-08-11 (×2): 5 mg via INTRAVENOUS

## 2011-08-11 MED ORDER — ACETAMINOPHEN 160 MG/5ML PO SOLN
650.0000 mg | ORAL | Status: DC | PRN
  Administered 2011-08-11 – 2011-08-14 (×5): 650 mg via ORAL
  Filled 2011-08-11 (×5): qty 20.3

## 2011-08-11 MED ORDER — SUFENTANIL CITRATE 50 MCG/ML IV SOLN
INTRAVENOUS | Status: DC | PRN
  Administered 2011-08-11 (×2): 10 ug via INTRAVENOUS
  Administered 2011-08-11 (×2): 5 ug via INTRAVENOUS
  Administered 2011-08-11 (×2): 10 ug via INTRAVENOUS
  Administered 2011-08-11: 5 ug via INTRAVENOUS
  Administered 2011-08-11: 10 ug via INTRAVENOUS
  Administered 2011-08-11: 15 ug via INTRAVENOUS
  Administered 2011-08-11 (×2): 10 ug via INTRAVENOUS

## 2011-08-11 MED ORDER — MIDAZOLAM HCL 5 MG/5ML IJ SOLN
INTRAMUSCULAR | Status: DC | PRN
  Administered 2011-08-11: 2 mg via INTRAVENOUS
  Administered 2011-08-11: 1 mg via INTRAVENOUS

## 2011-08-11 MED ORDER — ROCURONIUM BROMIDE 100 MG/10ML IV SOLN
INTRAVENOUS | Status: DC | PRN
  Administered 2011-08-11: 10 mg via INTRAVENOUS
  Administered 2011-08-11: 60 mg via INTRAVENOUS
  Administered 2011-08-11: 20 mg via INTRAVENOUS
  Administered 2011-08-11: 10 mg via INTRAVENOUS
  Administered 2011-08-11: 20 mg via INTRAVENOUS
  Administered 2011-08-11: 10 mg via INTRAVENOUS

## 2011-08-11 MED ORDER — ESMOLOL HCL-SODIUM CHLORIDE 2000 MG/100ML IV SOLN
INTRAVENOUS | Status: DC | PRN
  Administered 2011-08-11: 50 ug/kg/min via INTRAVENOUS

## 2011-08-11 MED ORDER — LIDOCAINE HCL (CARDIAC) 20 MG/ML IV SOLN
INTRAVENOUS | Status: DC | PRN
  Administered 2011-08-11: 70 mg via INTRAVENOUS

## 2011-08-11 MED ORDER — SODIUM CHLORIDE 0.9 % IV SOLN
INTRAVENOUS | Status: DC
  Administered 2011-08-11: 1000 mL via INTRAVENOUS

## 2011-08-11 MED ORDER — ONDANSETRON HCL 4 MG/2ML IJ SOLN
INTRAMUSCULAR | Status: DC | PRN
  Administered 2011-08-11: 4 mg via INTRAVENOUS

## 2011-08-11 MED ORDER — ACETAMINOPHEN 10 MG/ML IV SOLN
INTRAVENOUS | Status: AC
  Filled 2011-08-11: qty 100

## 2011-08-11 MED ORDER — INSULIN GLARGINE 100 UNIT/ML ~~LOC~~ SOLN
5.0000 [IU] | Freq: Every day | SUBCUTANEOUS | Status: DC
  Administered 2011-08-11 – 2011-08-13 (×3): 5 [IU] via SUBCUTANEOUS

## 2011-08-11 MED ORDER — PROMETHAZINE HCL 25 MG/ML IJ SOLN
6.2500 mg | INTRAMUSCULAR | Status: DC | PRN
  Administered 2011-08-11: 6.25 mg via INTRAVENOUS

## 2011-08-11 MED ORDER — BUPIVACAINE LIPOSOME 1.3 % IJ SUSP
INTRAMUSCULAR | Status: DC | PRN
  Administered 2011-08-11: 20 mL

## 2011-08-11 MED ORDER — LIDOCAINE HCL 4 % MT SOLN
OROMUCOSAL | Status: DC | PRN
  Administered 2011-08-11: 4 mL via TOPICAL

## 2011-08-11 MED ORDER — MORPHINE SULFATE 2 MG/ML IJ SOLN
2.0000 mg | INTRAMUSCULAR | Status: DC | PRN
  Administered 2011-08-11: 2 mg via INTRAVENOUS
  Administered 2011-08-11: 6 mg via INTRAVENOUS
  Administered 2011-08-11 – 2011-08-12 (×2): 4 mg via INTRAVENOUS
  Administered 2011-08-12 (×2): 6 mg via INTRAVENOUS
  Administered 2011-08-12: 4 mg via INTRAVENOUS
  Administered 2011-08-12: 2 mg via INTRAVENOUS
  Administered 2011-08-12: 4 mg via INTRAVENOUS
  Filled 2011-08-11: qty 3
  Filled 2011-08-11 (×3): qty 2
  Filled 2011-08-11: qty 1
  Filled 2011-08-11: qty 2
  Filled 2011-08-11 (×2): qty 3
  Filled 2011-08-11: qty 2

## 2011-08-11 MED ORDER — LACTATED RINGERS IR SOLN
Status: DC | PRN
  Administered 2011-08-11: 3000 mL

## 2011-08-11 MED ORDER — PROMETHAZINE HCL 25 MG/ML IJ SOLN
INTRAMUSCULAR | Status: AC
  Filled 2011-08-11: qty 1

## 2011-08-11 MED ORDER — HETASTARCH-ELECTROLYTES 6 % IV SOLN
INTRAVENOUS | Status: DC | PRN
  Administered 2011-08-11: 11:00:00 via INTRAVENOUS

## 2011-08-11 MED ORDER — HEPARIN SODIUM (PORCINE) 5000 UNIT/ML IJ SOLN
5000.0000 [IU] | Freq: Three times a day (TID) | INTRAMUSCULAR | Status: DC
  Administered 2011-08-11 – 2011-08-14 (×8): 5000 [IU] via SUBCUTANEOUS
  Filled 2011-08-11 (×11): qty 1

## 2011-08-11 MED ORDER — UNJURY CHICKEN SOUP POWDER
2.0000 [oz_av] | Freq: Four times a day (QID) | ORAL | Status: DC
  Administered 2011-08-13: 2 [oz_av] via ORAL

## 2011-08-11 MED ORDER — DEXTROSE 5 % IV SOLN
2.0000 g | INTRAVENOUS | Status: AC
  Administered 2011-08-11: 2 g via INTRAVENOUS
  Filled 2011-08-11: qty 2

## 2011-08-11 MED ORDER — ESMOLOL HCL 10 MG/ML IV SOLN
INTRAVENOUS | Status: DC | PRN
  Administered 2011-08-11 (×5): 20 mg via INTRAVENOUS

## 2011-08-11 MED ORDER — HYDROMORPHONE HCL PF 1 MG/ML IJ SOLN
INTRAMUSCULAR | Status: AC
  Filled 2011-08-11: qty 1

## 2011-08-11 SURGICAL SUPPLY — 72 items
APL SKNCLS STERI-STRIP NONHPOA (GAUZE/BANDAGES/DRESSINGS) ×2
APL SRG 32X5 SNPLK LF DISP (MISCELLANEOUS) ×2
APPLICATOR COTTON TIP 6IN STRL (MISCELLANEOUS) ×6 IMPLANT
BENZOIN TINCTURE PRP APPL 2/3 (GAUZE/BANDAGES/DRESSINGS) ×2 IMPLANT
BLADE SURG 15 STRL LF DISP TIS (BLADE) ×2 IMPLANT
BLADE SURG 15 STRL SS (BLADE) ×3
BLADE SURG ROTATE 9660 (MISCELLANEOUS) ×2 IMPLANT
CABLE HIGH FREQUENCY MONO STRZ (ELECTRODE) ×2 IMPLANT
CANISTER SUCTION 2500CC (MISCELLANEOUS) ×3 IMPLANT
CLIP SUT LAPRA TY ABSORB (SUTURE) ×3 IMPLANT
CLOTH BEACON ORANGE TIMEOUT ST (SAFETY) ×3 IMPLANT
CLSR STERI-STRIP ANTIMIC 1/2X4 (GAUZE/BANDAGES/DRESSINGS) ×2 IMPLANT
COVER SURGICAL LIGHT HANDLE (MISCELLANEOUS) ×3 IMPLANT
DEVICE SUT QUICK LOAD TK 5 (STAPLE) ×2 IMPLANT
DEVICE SUT TI-KNOT TK 5X26 (MISCELLANEOUS) ×2 IMPLANT
DEVICE SUTURE ENDOST 10MM (ENDOMECHANICALS) ×3 IMPLANT
DISSECTOR BLUNT TIP ENDO 5MM (MISCELLANEOUS) ×3 IMPLANT
DRAIN PENROSE 18X1/4 LTX STRL (WOUND CARE) ×3 IMPLANT
DRAPE CAMERA CLOSED 9X96 (DRAPES) ×3 IMPLANT
GAUZE SPONGE 4X4 16PLY XRAY LF (GAUZE/BANDAGES/DRESSINGS) ×3 IMPLANT
GLOVE BIOGEL M 8.0 STRL (GLOVE) ×3 IMPLANT
GOWN STRL NON-REIN LRG LVL3 (GOWN DISPOSABLE) ×3 IMPLANT
GOWN STRL REIN XL XLG (GOWN DISPOSABLE) ×6 IMPLANT
HANDLE STAPLE EGIA 4 XL (STAPLE) ×3 IMPLANT
HOVERMATT SINGLE USE (MISCELLANEOUS) ×3 IMPLANT
KIT BASIN OR (CUSTOM PROCEDURE TRAY) ×3 IMPLANT
KIT GASTRIC LAVAGE 34FR ADT (SET/KITS/TRAYS/PACK) ×3 IMPLANT
NDL SPNL 22GX3.5 QUINCKE BK (NEEDLE) ×1 IMPLANT
NEEDLE SPNL 22GX3.5 QUINCKE BK (NEEDLE) ×3 IMPLANT
NS IRRIG 1000ML POUR BTL (IV SOLUTION) ×3 IMPLANT
PACK CARDIOVASCULAR III (CUSTOM PROCEDURE TRAY) ×3 IMPLANT
PEN SKIN MARKING BROAD (MISCELLANEOUS) ×3 IMPLANT
RELOAD EGIA 45 MED/THCK PURPLE (STAPLE) ×3 IMPLANT
RELOAD EGIA 45 TAN VASC (STAPLE) ×3 IMPLANT
RELOAD EGIA 60 MED/THCK PURPLE (STAPLE) ×15 IMPLANT
RELOAD EGIA 60 TAN VASC (STAPLE) ×3 IMPLANT
RELOAD ENDO STITCH 2.0 (ENDOMECHANICALS) ×30
RELOAD STAPLE 60 MED/THCK ART (STAPLE) ×2 IMPLANT
RELOAD SUT SNGL STCH ABSRB 2-0 (ENDOMECHANICALS) ×5 IMPLANT
RELOAD SUT SNGL STCH BLK 2-0 (ENDOMECHANICALS) ×4 IMPLANT
SCISSORS LAP 5X45 EPIX DISP (ENDOMECHANICALS) ×3 IMPLANT
SEALANT SURGICAL APPL DUAL CAN (MISCELLANEOUS) ×3 IMPLANT
SET IRRIG TUBING LAPAROSCOPIC (IRRIGATION / IRRIGATOR) ×3 IMPLANT
SHEARS CURVED HARMONIC AC 45CM (MISCELLANEOUS) ×3 IMPLANT
SLEEVE ADV FIXATION 12X100MM (TROCAR) ×10 IMPLANT
SLEEVE ADV FIXATION 5X100MM (TROCAR) ×3 IMPLANT
SLEEVE Z-THREAD 5X100MM (TROCAR) IMPLANT
SOLUTION ANTI FOG 6CC (MISCELLANEOUS) ×3 IMPLANT
SPONGE GAUZE 4X4 12PLY (GAUZE/BANDAGES/DRESSINGS) ×3 IMPLANT
STAPLER VISISTAT 35W (STAPLE) ×3 IMPLANT
STRIP CLOSURE SKIN 1/2X4 (GAUZE/BANDAGES/DRESSINGS) IMPLANT
STRIP PERI DRY VERITAS 45 (STAPLE) ×3 IMPLANT
STRIP PERI DRY VERITAS 60 (STAPLE) ×5 IMPLANT
SUT RELOAD ENDO STITCH 2 48X1 (ENDOMECHANICALS) ×12
SUT RELOAD ENDO STITCH 2.0 (ENDOMECHANICALS) ×8
SUT SURGIDAC NAB ES-9 0 48 120 (SUTURE) ×2 IMPLANT
SUT VIC AB 2-0 SH 27 (SUTURE) ×3
SUT VIC AB 2-0 SH 27X BRD (SUTURE) ×2 IMPLANT
SUT VIC AB 4-0 SH 18 (SUTURE) ×3 IMPLANT
SUTURE RELOAD END STTCH 2 48X1 (ENDOMECHANICALS) ×12 IMPLANT
SUTURE RELOAD ENDO STITCH 2.0 (ENDOMECHANICALS) ×8 IMPLANT
SYR 20CC LL (SYRINGE) ×3 IMPLANT
SYR 30ML LL (SYRINGE) ×3 IMPLANT
SYR 50ML LL SCALE MARK (SYRINGE) ×3 IMPLANT
TAPE CLOTH SURG 4X10 WHT LF (GAUZE/BANDAGES/DRESSINGS) ×2 IMPLANT
TRAY FOLEY CATH 14FRSI W/METER (CATHETERS) ×3 IMPLANT
TROCAR ADV FIXATION 12X100MM (TROCAR) ×3 IMPLANT
TROCAR XCEL 12X100 BLDLESS (ENDOMECHANICALS) ×3 IMPLANT
TROCAR Z-THREAD FIOS 5X100MM (TROCAR) ×3 IMPLANT
TUBING CONNECTING 10 (TUBING) ×3 IMPLANT
TUBING ENDO SMARTCAP (MISCELLANEOUS) ×3 IMPLANT
TUBING FILTER THERMOFLATOR (ELECTROSURGICAL) ×3 IMPLANT

## 2011-08-11 NOTE — Op Note (Signed)
Surgeon: Pollyann Savoy. Daphine Deutscher, MD, FACS Asst:  Gaynelle Adu, MD, FACS Anesthesia: General endotracheal Drains: None  Procedure: Laparoscopic Roux en Y gastric bypass with 40 cm BP limb and 100 cm Roux limb, antecolic, antegastric, candy cane to the left.  Closure of Peterson's defect. Upper endoscopy.   Description of Procedure:  The patient was taken to OR 1 at [facility] and given general anesthesia.  The abdomen was prepped with PCMX and draped sterilely.  A time out was performed.    The operation began by identifying the ligament of Treitz. I measured 40 cm downstream and divided the bowel with a 6 cm Covidian stapler.  I sutured a Penrose drain along the Roux limb end.  I measured a 1 meter (100 cm) Roux limb and then placed the distal bowels to the BP limb side by side and performed a stapled jejunojejunostomy. The common defect was closed from either end with 4-0 Vicryl using the Endo Stitch. The mesenteric defect was closed with a running 2-0 silk using the Endo Stitch. Tisseel was applied to the suture line.  The omentum was divided with the harmonic scalpel.  The Nathanson retractor was inserted in the left lateral segment of liver was retracted. The foregut dissection ensued.  The EG junction wasn't straight forward.  He had a hiatal hernia by UGI and the anesthetist had difficulty passing an Ewald tube.  I dissected the right crus and anterior to the left crus.  I identified each posteriorally and approximated them with one 1-0 Endostitch ethibond with Ty Knot.  The endoscope was pass and used to construct the pouch.  The pouch was created with purple Covidien loads and after the first 2 six cm loads all loads had peristrips.    The Roux limb was then brought up with the candycane pointed left and a back row of sutures of 2-0 Vicryl were placed. I opened along the right side of each structure and inserted the 4.5 cm stapler to create the gastrojejunostomy. The common defect was closed from either end  with 2-0 Vicryl and a second row was placed anterior to that the Ewald tube acting as a stent across the anastomosis. The Penrose drain was removed. Peterson's defect was closed with 2-0 silk.   Endoscopy was performed by Dr. Andrey Campanile and the pouch was distended under water and no bubbles were seen.  No bleeding was noted and the efferent limb was cannulated.    The incisions were injected with Exparel and were closed with 4-0 Vicryl and benzoin and steristrips  The patient was taken to the recovery room in satisfactory condition.  [name], MD, FACS

## 2011-08-11 NOTE — Progress Notes (Signed)
Pt received from OR on Brevibloc gtt at   61mcg/kg/min.  Hr 80-90  AFIB.

## 2011-08-11 NOTE — Consult Note (Signed)
Cardiology Consult Note   Patient ID: [name] MRN: [id number], DOB/AGE: [birth year]   Admit date: 08/11/2011 Date of Consult: 08/11/2011  Primary Physician: [name], MD Primary Cardiologist: Rollene Rotunda, MD  Pt. Profile: [name] is a 71yo Caucasian male with PMHx significant for permanent atrial fibrillation (on Toprol-XL and chronic Coumadin anticoagulation), CKD (stage III), COPD, type 2 DM, HL, OSA, GERD and obesity who underwent laparoscopic Roux-en-Y gastric bypass surgery today at [facility]. Intra-operatively, he was noted to be in atrial fibrillation with RVR.   Reason for consult: evaluation/management of a-fib + RVR  PAST CARDIAC HISTORY  2D ECHO 05/2008:   1. Left ventricle: The cavity size was normal. Wall thickness was increased in a pattern of mild LVH. Systolic function was normal. The estimated ejection fraction was in the range of 55% to 60%. Hypokinesis of the basal-mid inferolateral myocardium. 2. Aortic valve: Trileaflet; mildly calcified leaflets. 3. Aorta: The aortic root was mildly dilated. Aortic root dimension: 40mm (ED). 4. Mitral valve: Mild mitral annular calcification. Trivial regurgitation. 5. Left atrium: The atrium was moderately dilated. 6. Tricuspid valve: Mild regurgitation. 7. Pulmonary arteries: PA peak pressure: 34mm Hg (S). 8. Pericardium, extracardiac: There was no pericardial effusion.  HPI:   The patient last followed up with Dr. Antoine Poche in 08/2007. He was noted to be in permanent atrial fibrillation at that time. He was noted to be largely asymptomatic with his atrial fibrillation denying palpitations, presyncope, syncope, shortness of breath or chest pain. He denied activity limitations. He was noted to be able to perform the stationary bicycle.   Pre-operative labs revealed mild hyperkalemia at 5.3. Mild hyponatremia at 134. BUN/Cr at 33/1.62 (baseline 1.5-1.7). INR 1.26, 1.19 today. The patient is awake  and responsive on my interview.   He states that he has had a prior cardiac catheterization (no prior chest pain or MI, however) > 10 years ago which was normal. He notes normal exercise and chemical stress testing > 10 years ago. Leading up to today's surgery and currently, he denies palpitations, lightheadedness, shortness of breath, chest pain, n/v, PND, orthopnea, LE edema. At baseline, he walks on a treadmill each morning without incident. He denies thyroid issues. He states that his atrial fibrillation is well-controlled at baseline, and his rate very rarely accelerates beyond 90-100 bpm. His Coumadin is managed at his PCP's office, and he notes that his INR has been consistently therapeutic prior to holding Coumadin for this procedure. No alcohol or tobacco abuse. No increased caffeine.  The patient apparently went in to atrial fibrillation with RVR intra-operatively. On noting his vital signs during the procedure, his HR accelerated to 100-150 consistently during the procedure. He was started on esmolol IV with resultant controlled VR. Upon assessment in the PACU, esmolol had been discontinued. Rhythm reveals atrial fibrillation with HR 70-80 bpm. BP 130-70 mmHg. SpO2 100% on 4L O2 via n/c.   Problem List: Past Medical History  Diagnosis Date  . Diverticulosis of colon (without mention of hemorrhage)   . Other pulmonary embolism and infarction   . Restless legs syndrome (RLS)   . Obesity, unspecified   . Irritable bowel syndrome   . Gastroparesis   . Unspecified essential hypertension   . Lumbago   . Depressive disorder, not elsewhere classified   . Anxiety state, unspecified   . Unspecified asthma   . Esophageal reflux   . Atrial fibrillation   . Pure hypercholesterolemia   . Type II or unspecified type diabetes  mellitus without mention of complication, not stated as uncontrolled   . Hemorrhoids   . Back pain, chronic   . Hx of gout   . Bruises easily   . Cancer     SKIN CANCER  REMOVED  . Sleep apnea     STOP BANG SCORE 6    Past Surgical History  Procedure Date  . Back surgery     X 4   . Knee surgery     Bilateral x 6  . Artery repair     Left forearm  . Foot surgery     Left foot   . Replacement total knee     Left knee   . Breath tek h pylori 01/08/2011    Procedure: BREATH TEK H PYLORI;  Surgeon: Valarie Merino, MD;  Location: [facility];  Service: General;  Laterality: N/A;  . Eye surgery   . Hand surgery     LEFT  . Leg surgery     FOR NECROTIZING FASCITIS L LEG AND GROIN     Allergies:  Allergies  Allergen Reactions  . Sulfacetamide Sodium     REACTION: Throat swelling  . Oxycodone Other (See Comments)    CONFUSION / HALLUCINATIONS    Home Medications: Prior to Admission medications   Medication Sig Start Date End Date Taking? Authorizing Provider  PRESCRIPTION MEDICATION    Yes Historical Provider, MD  albuterol (PROVENTIL) (2.5 MG/3ML) 0.083% nebulizer solution Take 2.5 mg by nebulization every 6 (six) hours as needed. WHEEZING AND SHORTNESS OF BREATH    Historical Provider, MD  atorvastatin (LIPITOR) 80 MG tablet Take 1 tablet (80 mg total) by mouth daily. 11/10/10 11/10/11  Amy Michelle Nasuti, MD  Bepotastine Besilate (BEPREVE) 1.5 % SOLN Place 1 drop into the left eye as needed. ONLY USES IF HIS EYE IS ITCHING BAD    Historical Provider, MD  fenofibrate 160 MG tablet Take 1 tablet (160 mg total) by mouth daily. 07/10/10 07/10/11  Amy Michelle Nasuti, MD  fenofibrate 160 MG tablet TAKE 1 TABLET BY MOUTH DAILY. 06/30/11   Amy Michelle Nasuti, MD  ferrous sulfate 325 (65 FE) MG tablet Take 325 mg by mouth 2 (two) times daily. Take one tablet twice daily    Historical Provider, MD  FLUoxetine (PROZAC) 40 MG capsule Take 1 capsule (40 mg total) by mouth daily. 03/30/11   Hannah Beat, MD  furosemide (LASIX) 20 MG tablet 1 tab po daily prn peripheral swelling 06/06/10   Amy E Ermalene Searing, MD  gabapentin (NEURONTIN) 600 MG tablet Take 1,200 mg by mouth 3  (three) times daily.    Historical Provider, MD  HYDROcodone-acetaminophen (NORCO) 10-325 MG per tablet Take 1 tablet by mouth every 4 (four) hours as needed. PAIN    Historical Provider, MD  insulin aspart (NOVOLOG FLEXPEN) 100 UNIT/ML injection Inject 20-27 Units into the skin 3 (three) times daily.     Historical Provider, MD  insulin detemir (LEVEMIR) 100 UNIT/ML injection Inject 70 Units into the skin daily.     Historical Provider, MD  loteprednol (LOTEMAX) 0.2 % SUSP Place 1 drop into both eyes 2 (two) times daily.    Historical Provider, MD  metoprolol succinate (TOPROL-XL) 25 MG 24 hr tablet Take 1 tablet (25 mg total) by mouth 2 (two) times daily. 06/30/11   Amy Michelle Nasuti, MD  omeprazole (PRILOSEC) 40 MG capsule Take 1 capsule (40 mg total) by mouth 2 (two) times daily. 06/30/11   Amy Michelle Nasuti, MD  topiramate (TOPAMAX) 25 MG tablet TAKE 3 TABLETS (75 MG TOTAL) BY MOUTH AT BEDTIME. 07/14/11   Amy E Ermalene Searing, MD  warfarin (COUMADIN) 2.5 MG tablet Take 1 tablet (2.5 mg total) by mouth daily. As directed  12/19/10   Excell Seltzer, MD    Inpatient Medications:     . bupivacaine liposome  20 mL Infiltration Once  . cefOXitin  2 g Intravenous 60 min Pre-Op  . HYDROmorphone      . metoprolol tartrate  25 mg Oral Once  . promethazine       Prescriptions prior to admission  Medication Sig Dispense Refill  . PRESCRIPTION MEDICATION       . albuterol (PROVENTIL) (2.5 MG/3ML) 0.083% nebulizer solution Take 2.5 mg by nebulization every 6 (six) hours as needed. WHEEZING AND SHORTNESS OF BREATH      . atorvastatin (LIPITOR) 80 MG tablet Take 1 tablet (80 mg total) by mouth daily.  90 tablet  3  . Bepotastine Besilate (BEPREVE) 1.5 % SOLN Place 1 drop into the left eye as needed. ONLY USES IF HIS EYE IS ITCHING BAD      . fenofibrate 160 MG tablet Take 1 tablet (160 mg total) by mouth daily.  90 tablet  3  . fenofibrate 160 MG tablet TAKE 1 TABLET BY MOUTH DAILY.  30 tablet  9  . ferrous sulfate 325  (65 FE) MG tablet Take 325 mg by mouth 2 (two) times daily. Take one tablet twice daily      . FLUoxetine (PROZAC) 40 MG capsule Take 1 capsule (40 mg total) by mouth daily.  90 capsule  3  . furosemide (LASIX) 20 MG tablet 1 tab po daily prn peripheral swelling  90 tablet  3  . gabapentin (NEURONTIN) 600 MG tablet Take 1,200 mg by mouth 3 (three) times daily.      [name] HYDROcodone-acetaminophen (NORCO) 10-325 MG per tablet Take 1 tablet by mouth every 4 (four) hours as needed. PAIN      . insulin aspart (NOVOLOG FLEXPEN) 100 UNIT/ML injection Inject 20-27 Units into the skin 3 (three) times daily.       . insulin detemir (LEVEMIR) 100 UNIT/ML injection Inject 70 Units into the skin daily.       [name] loteprednol (LOTEMAX) 0.2 % SUSP Place 1 drop into both eyes 2 (two) times daily.      . metoprolol succinate (TOPROL-XL) 25 MG 24 hr tablet Take 1 tablet (25 mg total) by mouth 2 (two) times daily.  180 tablet  3  . omeprazole (PRILOSEC) 40 MG capsule Take 1 capsule (40 mg total) by mouth 2 (two) times daily.  180 capsule  3  . topiramate (TOPAMAX) 25 MG tablet TAKE 3 TABLETS (75 MG TOTAL) BY MOUTH AT BEDTIME.  90 tablet  3  . warfarin (COUMADIN) 2.5 MG tablet Take 1 tablet (2.5 mg total) by mouth daily. As directed   90 tablet  3    Family History  Problem Relation Age of Onset  . Uterine cancer Mother     mets  . Breast cancer Mother   . Cancer Mother     cervical  . Emphysema Father   . Alzheimer's disease Sister   . Prostate cancer Brother   . Heart attack Brother   . Colon cancer Brother 35     History   Social History  . Marital Status: Married    Spouse Name: N/A    Number of Children: 1  .  Years of Education: N/A   Occupational History  . Retired     Naval architect   Social History Main Topics  . Smoking status: Never Smoker   . Smokeless tobacco: Never Used  . Alcohol Use: No  . Drug Use: No  . Sexually Active: Not on file   Other Topics Concern  . Not on file    Social History Narrative   DIET: 3 meals, F&V, some water, crystal light, No fast foodExercise: walks treadmill 30 min1 Caffeine drinks daily No living will.     Review of Systems: General: negative for chills, fever, night sweats or weight changes.  Cardiovascular: negative for chest pain, dyspnea on exertion, edema, orthopnea, palpitations, paroxysmal nocturnal dyspnea or shortness of breath Dermatological: negative for rash Respiratory: negative for cough or wheezing Urologic: negative for hematuria Abdominal: negative for nausea, vomiting, diarrhea, bright red blood per rectum, melena, or hematemesis Neurologic: negative for visual changes, syncope, or dizziness All other systems reviewed and are otherwise negative except as noted above.  Physical Exam: Blood pressure 139/84, pulse 78, temperature 98.3 F (36.8 C), temperature source Oral, resp. rate 17, height 6\' 3"  (1.905 m), weight 126.213 kg (278 lb 4 oz), SpO2 100.00%.    General: Obese, well developed, in no acute distress. Head: Normocephalic, atraumatic, sclera non-icteric, no xanthomas, nares are without discharge.  Neck: Negative for carotid bruits. JVD not elevated. Lungs: Clear bilaterally to auscultation without wheezes, rales, or rhonchi. Breathing is unlabored. Heart: Irregularly irregular, with S1 S2. No murmurs, rubs, or gallops appreciated. Abdomen: Soft, non-tender, non-distended, absent bowel sounds. No hepatomegaly. No rebound/guarding. No obvious abdominal masses. Msk:  Strength and tone appears normal for age. Extremities: No clubbing, cyanosis or edema.  Distal pedal pulses are 2+ and equal bilaterally. Skin: pink, moist, warm, cap refill < 2 sec Neuro: Alert and oriented X 3. Moves all extremities spontaneously. Psych:  Responds to questions appropriately with a normal affect.  Labs:  Lab 08/07/11 1230  NA 134*  K 5.3*  CL 103  CO2 21  BUN 33*  CREATININE 1.62*  CALCIUM 10.1  PROT --  BILITOT  --  ALKPHOS --  ALT --  AST --  AMYLASE --  LIPASE --  GLUCOSE 116*   Radiology/Studies: No results found.  EKG: pending  ASSESSMENT:   1. Permanent atrial fibrillation 2. Post-op Roux-en-Y 2. CKD, stage III 3. COPD 4. Type 2 DM 5. OSA 6. HTN 7. GERD  DISCUSSION/PLAN:  A-fib + RVR intra-operatively during laparoscopic Roux-en-Y gastric bypass today. The patient's states that at baseline, his atrial fibrillation is well-controlled. He is largely asymptomatic with it, and has had adequate rate-control on daily Toprol-XL. Coumadin was held pre-op. Leading up to today's surgery, he had been in his usual state of health, and denied ischemic, arrhythmic or heart failure-type symptoms. His rate stayed between 100-150 bpm intra-operatively. This was controlled on esmolol IV. On discontinuation of esmolol, his rate has remained 70-80 bpm. VSS. Patient asymptomatic. The patient has structural cardiac abnormalities (moderate LA dilatation, mild LVH, mild aortic root dilatation, mild TR) and WMA (hypokinesis of the basal-mid inferolateral myocardium). Would evaluate electrolyte derangements (mildly hyperkalemic, hyponatremic on pre-op labs 08/07/11) and renal function with BMET today. Assess CBC for anemia. Check TSH for hypo/hyperthyroidism. Will get formal EKG. Cycle cardiac biomarkers given WMA on 2010 echo. Repeat echo. Likely secondary to stress response from operative procedure. Will monitor rate post-operatively. Will start on Lopressor 5mg  q6h PRN for HR > 110 until he  is able to resume daily Toprol-XL. Re-initiate Coumadin when surgery deems appropriate.    Signed, R. Hurman Horn, PA-C 08/11/2011, 3:42 PM  Agree with above assessment and plan.  He has been essentially asymptomatic from his chronic atrial fib which has previously been well-controlled on low dose Toprol 25 mg daily. He took his Toprol this am. Rapid heart rate response in OR was most likely secondary to the stress of  surgery. Now in his room his rate is back in the 70s.  Denies any chest pain. No dyspnea. Exam is unremarkable. Post-op EKG normal except for his atrial fib.

## 2011-08-11 NOTE — Progress Notes (Signed)
  Echocardiogram 2D Echocardiogram has been performed.  Jorje Guild 08/11/2011, 4:40 PM

## 2011-08-11 NOTE — Anesthesia Postprocedure Evaluation (Signed)
  Anesthesia Post-op Note  Patient: [name]  Procedure(s) Performed: Procedure(s) (LRB): LAPAROSCOPIC ROUX-EN-Y GASTRIC BYPASS WITH UPPER ENDOSCOPY (N/A)  Patient Location: PACU  Anesthesia Type: General  Level of Consciousness: awake and alert   Airway and Oxygen Therapy: Patient Spontanous Breathing  Post-op Pain: mild  Post-op Assessment: Post-op Vital signs reviewed, Patient's Cardiovascular Status Stable, Respiratory Function Stable, Patent Airway and No signs of Nausea or vomiting  Post-op Vital Signs: stable  Complications: No apparent anesthesia complications

## 2011-08-11 NOTE — Transfer of Care (Signed)
Immediate Anesthesia Transfer of Care Note  Patient: [name]  Procedure(s) Performed: Procedure(s) (LRB): LAPAROSCOPIC ROUX-EN-Y GASTRIC BYPASS WITH UPPER ENDOSCOPY (N/A)  Patient Location: PACU  Anesthesia Type: General  Level of Consciousness: awake, alert  and oriented  Airway & Oxygen Therapy: Patient Spontanous Breathing and Patient connected to face mask oxygen  Post-op Assessment: Report given to PACU RN, Post -op Vital signs reviewed and stable and Esmolol drip continuing at 85mcg/kg/min.  Post vital signs: Reviewed and stable  Complications: No apparent anesthesia complications

## 2011-08-11 NOTE — Progress Notes (Signed)
When ask about pain I was given the response twice that pain was elevated with movement and deep breathing. We discussed the importance of ambulation. At this time we agreed that pain & nausea management would be our #1 priority so that he will be more enticed to ambulate and deep breathe with IS.     Pt was up from bed to chair from 20:00 - 22:30.  Pt kept requesting to return to bed earlier, but I ask him to remain up longer since by this time he should be up ambulating and by sitting up we were at least making an attempt at ambulation. We talked about when returning to bed that he would be turned to avoid gas settlement from surgery.    Transfer back to bed was much easier and less effort was required, almost at independent status.   IS started he achieved 1500 with minimal effort.

## 2011-08-11 NOTE — H&P (Addendum)
Chief Complaint: I don't want to take all of these medicines anymore  History of Present Illness: [name] is an 71 y.o. male who comes in with his wife before his roux y gastric bypass scheduled 08/11/11. He is aware of the risk and benefits of the procedure. He comes in today with a weight of 284.8. He's had metabolic syndrome for greater than 5 years and is followed by Dr. Linton Rump bed so have a [employer]. On upper GI he does have a small hiatal hernia and some reflux. Ultrasound showed no evidence of gallstones or gallbladder wall thickening.  I gave him a bowel prep and discussed the surgery upcoming. He is doing well on the diet and has noticed some changes in his diabetes management. All questions were answered and he is ready for surgery next week.  Past Medical History   Diagnosis  Date   .  Diverticulosis of colon (without mention of hemorrhage)    .  Other pulmonary embolism and infarction    .  Restless legs syndrome (RLS)    .  Obesity, unspecified    .  Irritable bowel syndrome    .  Gastroparesis    .  Unspecified essential hypertension    .  Lumbago    .  Depressive disorder, not elsewhere classified    .  Anxiety state, unspecified    .  Unspecified asthma    .  Esophageal reflux    .  Atrial fibrillation    .  Pure hypercholesterolemia    .  Type II or unspecified type diabetes mellitus without mention of complication, not stated as uncontrolled    .  Hemorrhoids     Past Surgical History   Procedure  Date   .  Back surgery      X 4   .  Knee surgery      Bilateral x 6   .  Artery repair      Left forearm   .  Foot surgery      Left foot   .  Replacement total knee      Left knee   .  Breath tek h pylori  01/08/2011     Procedure: BREATH TEK H PYLORI; Surgeon: Valarie Merino, MD; Location: [facility]; Service: General; Laterality: N/A;    Current Outpatient Prescriptions   Medication  Sig  Dispense  Refill   .  albuterol (PROVENTIL) (2.5 MG/3ML)  0.083% nebulizer solution  Take 2.5 mg by nebulization every 6 (six) hours as needed. WHEEZING AND SHORTNESS OF BREATH     .  atorvastatin (LIPITOR) 80 MG tablet  Take 1 tablet (80 mg total) by mouth daily.  90 tablet  3   .  Bepotastine Besilate (BEPREVE) 1.5 % SOLN  Place 1 drop into the left eye as needed. ONLY USES IF HIS EYE IS ITCHING BAD     .  fenofibrate 160 MG tablet  Take 1 tablet (160 mg total) by mouth daily.  90 tablet  3   .  fenofibrate 160 MG tablet  TAKE 1 TABLET BY MOUTH DAILY.  30 tablet  9   .  ferrous sulfate 325 (65 FE) MG tablet  Take 325 mg by mouth 2 (two) times daily. Take one tablet twice daily     .  FLUoxetine (PROZAC) 40 MG capsule  Take 1 capsule (40 mg total) by mouth daily.  90 capsule  3   .  furosemide (  LASIX) 20 MG tablet  1 tab po daily prn peripheral swelling  90 tablet  3   .  gabapentin (NEURONTIN) 600 MG tablet  Take 1,200 mg by mouth 3 (three) times daily.     [name]  HYDROcodone-acetaminophen (NORCO) 10-325 MG per tablet  Take 1 tablet by mouth every 4 (four) hours as needed. PAIN     .  insulin aspart (NOVOLOG FLEXPEN) 100 UNIT/ML injection  Inject 20-27 Units into the skin 3 (three) times daily.     .  insulin detemir (LEVEMIR) 100 UNIT/ML injection  Inject 70 Units into the skin daily.     [name]  loteprednol (LOTEMAX) 0.2 % SUSP  Place 1 drop into both eyes 2 (two) times daily.     .  metoprolol succinate (TOPROL-XL) 25 MG 24 hr tablet  Take 1 tablet (25 mg total) by mouth 2 (two) times daily.  180 tablet  3   .  omeprazole (PRILOSEC) 40 MG capsule  Take 1 capsule (40 mg total) by mouth 2 (two) times daily.  180 capsule  3   .  topiramate (TOPAMAX) 25 MG tablet  TAKE 3 TABLETS (75 MG TOTAL) BY MOUTH AT BEDTIME.  90 tablet  3   .  warfarin (COUMADIN) 2.5 MG tablet  Take 1 tablet (2.5 mg total) by mouth daily. As directed  90 tablet  3    Sulfacetamide sodium  Family History   Problem  Relation  Age of Onset   .  Uterine cancer  Mother       mets    .   Breast cancer  Mother    .  Cancer  Mother       cervical    .  Emphysema  Father    .  Alzheimer's disease  Sister    .  Prostate cancer  Brother    .  Heart attack  Brother    .  Colon cancer  Brother  105    Social History: reports that he has never smoked. He has never used smokeless tobacco. He reports that he does not drink alcohol or use illicit drugs.  REVIEW OF SYSTEMS - PERTINENT POSITIVES ONLY:  noncontributory  Physical Exam:  Blood pressure 122/82, pulse 72, temperature 97.2 F (36.2 C), temperature source Temporal, resp. rate 18, height 6\' 3"  (1.905 m), weight 284 lb 8 oz (129.048 kg).  Body mass index is 35.56 kg/(m^2).  Gen: WDWN WM NAD  Neurological: Alert and oriented to person, place, and time. Motor and sensory function is grossly intact  Head: Normocephalic and atraumatic.  Eyes: Conjunctivae are normal. Pupils are equal, round, and reactive to light. No scleral icterus.  Neck: Normal range of motion. Neck supple. No tracheal deviation or thyromegaly present.  Cardiovascular: SR without murmurs or gallops. No carotid bruits  Respiratory: Effort normal. No respiratory distress. No chest wall tenderness. Breath sounds normal. No wheezes, rales or rhonchi.  Abdomen: Panniculus noted and moderate abdominal fat. Nontender.  GU:  Musculoskeletal: Normal range of motion. Extremities are nontender. No cyanosis, edema or clubbing noted Lymphadenopathy: No cervical, preauricular, postauricular or axillary adenopathy is present Skin: Skin is warm and dry. No rash noted. No diaphoresis. No erythema. No pallor. Pscyh: Normal mood and affect. Behavior is normal. Judgment and thought content normal.  LABORATORY RESULTS:  No results found for this or any previous visit (from the past 48 hour(s)).  RADIOLOGY RESULTS:  No results found.  Problem List:  Patient Active Problem  List   Diagnosis   .  HERPES ZOSTER W/NERVOUS COMPLICATION NEC   .  DM   .  HYPERCHOLESTEROLEMIA   .   UNSPECIFIED ANEMIA   .  ANXIETY   .  DEPRESSION   .  RESTLESS LEG SYNDROME   .  HYPERTENSION   .  ATRIAL FIBRILLATION, PAROXYSMAL   .  ALLERGIC RHINITIS CAUSE UNSPECIFIED   .  OTHER DISEASES OF NASAL CAVITY AND SINUSES   .  ASTHMA, PERSISTENT, MILD   .  GASTROPARESIS   .  DIVERTICULOSIS, COLON   .  IRRITABLE BOWEL SYNDROME   .  RENAL INSUFFICIENCY   .  HYDRONEPHROSIS, RIGHT   .  LOW BACK PAIN, CHRONIC   .  OSTEOPOROSIS, DRUG-INDUCED   .  FOOT SURGERY, HX OF   .  KNEE REPLACEMENT, LEFT, HX OF   .  Acute venous embolism and thrombosis of deep vessels of proximal lower extremity   .  Diverticulitis of colon   .  Obesity, diabetes, and hypertension syndrome   .  GERD (gastroesophageal reflux disease)   .  Nonsurgical dumping syndrome   .  Bacterial overgrowth syndrome   .  Obesity   .  Dyspnea   .  Acute asthma exacerbation   .  Chronic cough   .  Preop pulmonary/respiratory exam    Assessment & Plan:  Morbid obesity and diabetes mellitus2  Plan laparoscopic Roux-en-Y gastric bypass  [name], MD, [facility]  [phone] beeper  [phone]  There has been no change in the patient's past medical history or physical exam in the past 24 hours to the best of my knowledge. I examined the patient in the holding area and have made any changes to the history and physical exam report that is included above.   Expectations and outcome results have been discussed with the patient to include risks and benefits.  All questions have been answered and we will proceed with previously discussed procedure noted and signed in the consent form in the patient's record.    Tnya Ades BMD FACS 9:30 AM  08/11/2011

## 2011-08-11 NOTE — Progress Notes (Signed)
Utilization review completed.  

## 2011-08-11 NOTE — Op Note (Signed)
Preoperative diagnosis: Obesity, Diabetes Mellitus  Postoperative diagnosis: Same   Procedure: Esophagogastroduodenoscopy   Surgeon: Mary Sella. Kellyann Ordway M.D., FACS   Anesthesia: Gen.   Indications for procedure: 71 yo WM who is undergoing a Laparoscopic Roux-en-Y gastric bypass that requires EGD to interrogate the pouch and anastomosis.  Description of procedure: After we have completed the new gastrojejunostomy, I scrubbed out and obtained the Olympus endoscope. I gently placed endoscope in the patient's oropharynx and gently glided it down the esophagus without any difficulty under direct visualization. Once I was in the gastric pouch, I insufflated the pouch was air. The pouch was approximately 5 cm in size. I was able to cannulate and advanced the scope through the gastrojejunostomy. Dr. Daphine Deutscher had placed saline in the upper abdomen. Upon further insufflation of the gastric pouch there was no evidence of bubbles. Upon further inspection of the gastric pouch, the mucosa appeared normal. There is no evidence of any mucosal abnormality. The gastric pouch and Roux limb were decompressed. The width of the gastrojejunal anastomosis was at least 2.5 cm. The scope was withdrawn. The patient tolerated this portion of the procedure well. Please see Dr Daphine Deutscher operative note for details regarding the laparoscopic roux-en-y gastric bypass.  Mary Sella. Andrey Campanile, MD, FACS General, Bariatric, & Minimally Invasive Surgery [facility], [place]

## 2011-08-11 NOTE — Progress Notes (Signed)
1610 Paged Dr. Luretha Murphy re: orders [phone] Dr. Daphine Deutscher called back gave order for consent was okay with lab orders per anesthesia

## 2011-08-11 NOTE — Anesthesia Preprocedure Evaluation (Addendum)
Anesthesia Evaluation  Patient identified by MRN, date of birth, ID band Patient awake    Reviewed: Allergy & Precautions, H&P , NPO status , Patient's Chart, lab work & pertinent test results  Airway Mallampati: II TM Distance: <3 FB Neck ROM: Full    Dental No notable dental hx.    Pulmonary sleep apnea ,  H/O PE breath sounds clear to auscultation  Pulmonary exam normal       Cardiovascular hypertension, Pt. on medications + dysrhythmias Atrial Fibrillation Rhythm:Irregular Rate:Normal     Neuro/Psych negative neurological ROS  negative psych ROS   GI/Hepatic negative GI ROS, Neg liver ROS,   Endo/Other  Diabetes mellitus-, Type 2  Renal/GU Renal InsufficiencyRenal disease  negative genitourinary   Musculoskeletal negative musculoskeletal ROS (+)   Abdominal   Peds negative pediatric ROS (+)  Hematology negative hematology ROS (+)   Anesthesia Other Findings   Reproductive/Obstetrics negative OB ROS                          Anesthesia Physical Anesthesia Plan  ASA: III  Anesthesia Plan: General   Post-op Pain Management:    Induction: Intravenous  Airway Management Planned: Oral ETT  Additional Equipment:   Intra-op Plan:   Post-operative Plan: Extubation in OR  Informed Consent: I have reviewed the patients History and Physical, chart, labs and discussed the procedure including the risks, benefits and alternatives for the proposed anesthesia with the patient or authorized representative who has indicated his/her understanding and acceptance.   Dental advisory given  Plan Discussed with: CRNA  Anesthesia Plan Comments:         Anesthesia Quick Evaluation

## 2011-08-12 ENCOUNTER — Inpatient Hospital Stay (HOSPITAL_COMMUNITY): Payer: Medicare Other

## 2011-08-12 ENCOUNTER — Encounter (HOSPITAL_COMMUNITY): Payer: Self-pay | Admitting: Surgery

## 2011-08-12 DIAGNOSIS — I10 Essential (primary) hypertension: Secondary | ICD-10-CM

## 2011-08-12 DIAGNOSIS — I4891 Unspecified atrial fibrillation: Secondary | ICD-10-CM

## 2011-08-12 DIAGNOSIS — Z09 Encounter for follow-up examination after completed treatment for conditions other than malignant neoplasm: Secondary | ICD-10-CM

## 2011-08-12 DIAGNOSIS — E119 Type 2 diabetes mellitus without complications: Secondary | ICD-10-CM

## 2011-08-12 LAB — CARDIAC PANEL(CRET KIN+CKTOT+MB+TROPI)
CK, MB: 0.7 ng/mL (ref 0.3–4.0)
Relative Index: INVALID (ref 0.0–2.5)
Troponin I: <0.3 ng/mL (ref <0.30)

## 2011-08-12 LAB — GLUCOSE, CAPILLARY
Glucose-Capillary: 132 mg/dL — ABNORMAL HIGH (ref 70–99)
Glucose-Capillary: 143 mg/dL — ABNORMAL HIGH (ref 70–99)
Glucose-Capillary: 147 mg/dL — ABNORMAL HIGH (ref 70–99)
Glucose-Capillary: 151 mg/dL — ABNORMAL HIGH (ref 70–99)
Glucose-Capillary: 153 mg/dL — ABNORMAL HIGH (ref 70–99)
Glucose-Capillary: 157 mg/dL — ABNORMAL HIGH (ref 70–99)

## 2011-08-12 LAB — CBC
HCT: 39.7 % (ref 39.0–52.0)
Hemoglobin: 13.1 g/dL (ref 13.0–17.0)
RBC: 4.27 MIL/uL (ref 4.22–5.81)
WBC: 8.9 10*3/uL (ref 4.0–10.5)

## 2011-08-12 LAB — HEMOGLOBIN AND HEMATOCRIT, BLOOD: Hemoglobin: 12.5 g/dL — ABNORMAL LOW (ref 13.0–17.0)

## 2011-08-12 LAB — DIFFERENTIAL
Lymphocytes Relative: 11 % — ABNORMAL LOW (ref 12–46)
Monocytes Absolute: 1 10*3/uL (ref 0.1–1.0)
Monocytes Relative: 11 % (ref 3–12)
Neutro Abs: 6.9 10*3/uL (ref 1.7–7.7)
Neutrophils Relative %: 77 % (ref 43–77)

## 2011-08-12 LAB — BASIC METABOLIC PANEL
Chloride: 105 mEq/L (ref 96–112)
GFR calc Af Amer: 63 mL/min — ABNORMAL LOW (ref >90)
GFR calc non Af Amer: 54 mL/min — ABNORMAL LOW (ref >90)
Potassium: 3.8 mEq/L (ref 3.5–5.1)
Sodium: 132 mEq/L — ABNORMAL LOW (ref 135–145)

## 2011-08-12 MED ORDER — IOHEXOL 300 MG/ML  SOLN: 50.0000 mL | Freq: Once | INTRAMUSCULAR | Status: AC | PRN

## 2011-08-12 MED ORDER — METOPROLOL SUCCINATE ER 25 MG PO TB24
25.0000 mg | ORAL_TABLET | Freq: Every day | ORAL | Status: DC
  Administered 2011-08-12 – 2011-08-14 (×3): 25 mg via ORAL
  Filled 2011-08-12 (×3): qty 1

## 2011-08-12 NOTE — Progress Notes (Signed)
Pt alert and oriented; VSS; doppler study just completed; awaiting UGI; remains NPO; c/o headache and some abdominal pain with relief from prn meds; denies nausea or vomiting; denies flatus or BM at this time; foley intact and draining well; ambulating in room without difficulty; using incentive spirometer; pt already has follow up appts with [facility] and [facility]; aware of BELT program and support group; discharge instructions given for pt to review. GASTRIC BYPASS/SLEEVE DISCHARGE INSTRUCTIONS  Drs. Fredrik Rigger, [place], [place], and [place] Call if you have any problems.   Call [phone] and ask for the surgeon on call.    If you need immediate assistance come to the ER at [facility]. Tell the ER personnel that you are a new post-op gastric bypass patient. Signs and symptoms to report:   Severe vomiting or nausea. If you cannot tolerate clear liquids for longer than 1 day, you need to call your surgeon.    Abdominal pain which does not get better after taking your pain medication   Fever greater than 101 F degree   Difficulty breathing   Chest pain    Redness, swelling, drainage, or foul odor at incision sites    If your incisions open or pull apart   Swelling or pain in calf (lower leg)   Diarrhea, frequent watery, uncontrolled bowel movements.   Constipation, (no bowel movements for 3 days) if this occurs, Take Milk of Magnesia, 2 tablespoons by mouth, 3 times a day for 2 days if needed.  Call your doctor if constipation continues. Stop taking Milk of Magnesia once you have had a bowel movement. You may also use Miralax according to the label instructions.   Anything you consider "abnormal for you".   Normal side effects after Surgery:   Unable to sleep at night or concentrate   Irritability   Being tearful (crying) or depressed   These are common complaints, possibly related to your anesthesia, stress of surgery and change in lifestyle, that usually go away a few weeks after surgery.  If  these feelings continue, call your medical doctor.  Wound Care You may have surgical glue, steri-strips, or staples over your incisions after surgery.  Surgical glue:  Looks like a clear film over your incisions and will wear off gradually. Steri-strips: Strips of tape over your incisions. You may notice a yellowish color on the skin underneath the steri-strips. This is a substance used to make the steri-strips stick better. Do not pull the steri-strips off - let them fall off.  Staples: Cherlynn Polo may be removed before you leave the hospital. If you go home with staples, call [facility] [phone]) for an appointment with your surgeon's nurse to have staples removed in 7 - 10 days. Showering: You may shower two days after your surgery unless otherwise instructed by your surgeon. Wash gently around wounds with warm soapy water, rinse well, and gently pat dry.  If you have a drain, you may need someone to hold this while you shower. Avoid tub baths until staples are removed and incisions are healed.    Medications   Medications should be liquid or crushed if larger than the size of a dime.  Extended release pills should not be crushed.   Depending on the size and number of medications you take, you may need to stagger/change the time you take your medications so that you do not over-fill your pouch.    Make sure you follow-up with your primary care physician to make medication adjustments needed  during rapid weight loss and life-style adjustment.   If you are diabetic, follow up with the doctor that prescribes your diabetes medication(s) within one week after surgery and check your blood sugar regularly.   Do not drive while taking narcotics!   Do not take acetaminophen (Tylenol) and Roxicet or Lortab Elixir at the same time since these pain medications contain acetaminophen.  Diet at home: (First 2 Weeks) You will see the nutritionist two weeks after your surgery. She will advance  your diet if you are tolerating liquids well. Once at home, if you have severe vomiting or nausea and cannot tolerate clear liquids lasting longer than 1 day, call your surgeon.  Begin high protein shake 2 ounces every 3 hours, 5 - 6 times per day.  Gradually increase the amount you drink as tolerated.  You may find it easier to slowly sip shakes throughout the day.  It is important to get your proteins in first.   Protein Shake   Drink at least 2 ounces of shake 5-6 times per day   Each serving of protein shakes should have a minimum of 15 grams of protein and no more than 5 grams of carbohydrate    Increase the amount of protein shake you drink as tolerated   Protein powder may be added to fluids such as non-fat milk or Lactaid milk (limit to 20 grams added protein powder per serving   The initial goal is to drink at least 8 ounces of protein shake/drink per day (or as directed by the nutritionist). Some examples of protein shakes are [employer], [employer], EAS Edge HP, and [employer]jury. Hydration   Gradually increase the amount of water and other liquids as tolerated (See Acceptable Fluids)   Gradually increase the amount of protein shake as tolerated     Sip fluids slowly and throughout the day   May use Sugar substitutes, use sparingly (limit to 6 - 8 packets per day). Your fluid goal is 64 ounces of fluid daily. It may take a few weeks to build up to this.         32 oz (or more) should be clear liquids and 32 oz (or more) should be full liquids.         Liquids should not contain sugar, caffeine, or carbonation! Acceptable Fluids Clear Liquids:   Water or Sugar-free flavored water, Fruit H2O   Decaffeinated coffee or tea (sugar-free)   Crystal Lite, Wyler's Lite, Minute Maid Lite   Sugar-free Jell-O   Bouillon or broth   Sugar-free Popsicle:   *Less than 20 calories each; Limit 1 per day   Full Liquids:              Protein Shakes/Drinks + 2 choices per day of other full  liquids shown below.    Other full liquids must be: No more than 12 grams of Carbs per serving,  No more than 3 grams of Fat per serving   Strained low-fat cream soup   Non-Fat milk   Fat-free Lactaid Milk   Sugar-free yogurt (Dannon Lite & Fit) Vitamins and Minerals (Start 1 day after surgery unless otherwise directed)   2 Chewable Multivitamin / Multimineral Supplement (i.e. Centrum for Adults)   Chewable Calcium Citrate with Vitamin D-3. Take 1500 mg each day.           (Example: 3 Chewable Calcium Plus 600 with Vitamin D-3 can be found at [facility])         Vitamin  B-12, 350 - 500 micrograms (oral tablet) each day   Do not mix multivitamins containing iron with calcium supplements; take 2 hours   apart   Do not substitute Tums (calcium carbonate) for your calcium   Menstruating women and those at risk for anemia may need extra iron. Talk with your doctor to see if you need additional iron.    If you need extra iron:  Total daily Iron recommendations (including Vitamins) = 50 - 100 mg Iron/day Do not stop taking or change any vitamins or minerals until you talk to your nutritionist or surgeon. Your nutritionist and / or physician must approve all vitamin and mineral supplements. Exercise For maximum success, begin exercising as soon as your doctor recommends. Make sure your physician approves any physical activity.   Depending on fitness level, begin with a simple walking program   Walk 5-15 minutes each day, 7 days per week.    Slowly increase until you are walking 30-45 minutes per day   Consider joining our BELT program. [phone] or email belt@[employer] .edu Things to remember:    You may have sexual relations when you feel comfortable. It is VERY important for male patients to use a reliable birth control method. Fertility often increases after surgery. Do not get pregnant for at least 18 months.   It is very important to keep all follow up appointments with your surgeon, nutritionist,  primary care physician, and behavioral health practitioner. After the first year, please follow up with your bariatric surgeon at least once a year in order to maintain best weight loss results.  [facility]: [phone] [facility]: [phone]   Free counseling is available for you and your family through collaboration between [facility] and [place]. Please call [phone] and leave a message.    Consider purchasing a medical alert bracelet that says you had gastric bypass surgery.    [facility] has a free Bariatric Surgery Support Group that meets monthly, the 3rd Thursday, 6 pm, Classroom #1, [place]. You may register online at [email or web address], but registration is not necessary. Select Classes and Support Groups, Bariatric Surgery, or Call [phone]   Do not return to work or drive until cleared by your surgeon   Use your CPAP when sleeping if applicable   Do not lift anything greater than ten pounds for at least two weeks  Talmadge Chad, RN Bariatric Nurse Coordinator

## 2011-08-12 NOTE — Progress Notes (Signed)
   TELEMETRY: Reviewed telemetry pt in atrial fibrillation rate average 100: Filed Vitals:   08/12/11 0400 08/12/11 0500 08/12/11 0545 08/12/11 0600  BP: 128/70 114/63  129/62  Pulse: 118 100 70 91  Temp: 99.8 F (37.7 C)  97.7 F (36.5 C)   TempSrc: Oral  Oral   Resp: 24 24 24 19   Height:      Weight:      SpO2: 97% 96% 93% 95%    Intake/Output Summary (Last 24 hours) at 08/12/11 0659 Last data filed at 08/12/11 0600  Gross per 24 hour  Intake   5250 ml  Output   1910 ml  Net   3340 ml    SUBJECTIVE Complains of abdominal pain with movement, deep breathing. No chest pain or SOB.  LABS: Basic Metabolic Panel:  Basename 08/12/11 0347 08/11/11 1638  NA 132* 134*  K 3.8 4.3  CL 105 106  CO2 18* 21  GLUCOSE 155* 153*  BUN 17 25*  CREATININE 1.30 1.62*  CALCIUM 8.4 8.8  MG -- --  PHOS -- --   CBC:  Basename 08/12/11 0347 08/11/11 1638  WBC 8.9 9.7  NEUTROABS 6.9 --  HGB 13.1 13.5  HCT 39.7 40.8  MCV 93.0 94.0  PLT 179 189   Cardiac Enzymes:  Basename 08/12/11 0347 08/11/11 2200 08/11/11 1636  CKTOTAL 72 77 69  CKMB 0.7 1.0 1.1  CKMBINDEX -- -- --  TROPONINI <0.30 <0.30 <0.30   Thyroid Function Tests:  Basename 08/11/11 1638  TSH 3.712  T4TOTAL --  T3FREE --  THYROIDAB --    Radiology/Studies:  No results found.  PHYSICAL EXAM General: Well developed, well nourished, in no acute distress. Head: Normal Neck: Negative for carotid bruits. JVD not elevated. Lungs: Clear bilaterally to auscultation without wheezes, rales, or rhonchi. Breathing is unlabored. Heart: IRRR S1 S2 without murmurs, rubs, or gallops.  Abdomen: Soft, Mild tenderness without rebound. Msk:  Strength and tone appears normal for age. Extremities: No clubbing, cyanosis or edema.  Distal pedal pulses are 2+ and equal bilaterally. Neuro: Alert and oriented X 3. Moves all extremities spontaneously. Psych:  Responds to questions appropriately with a normal affect.  ASSESSMENT  AND PLAN: 1. Atrial fibrillation-  Permanent. HR controlled on metoprolol. Echo done but not yet reported. No cardiac symptoms/complications. Cardiac enzymes all negative. TSH is normal. Potassium is ok. Hgb is good. Resume Coumadin when OK from surgery standpoint.  2. S/p roux-en-Y gastric bypass.  3. DM type 2  4. HTN controlled.  Active Problems:  DM  HYPERTENSION  Atrial fibrillation, permanent    Signed, Nyheem Binette [place] MD,FACC 08/12/2011 6:59 AM

## 2011-08-12 NOTE — Progress Notes (Signed)
*  Preliminary Results* Bilateral lower extremity venous duplex completed. Bilateral lower extremities are negative for deep vein thrombosis. Unable to visualize proximal to mid segment of the left femoral vein due to foley catheter, therefore deep vein thrombosis cannot be excluded in this segment.  08/12/2011 10:53 AM Gertie Fey, RDMS, RDCS

## 2011-08-12 NOTE — Progress Notes (Signed)
Patient ID: [name], male   DOB: [birth year], 71 y.o.   MRN: [id number] [facility] Progress Note:   1 Day Post-Op  Subjective: Mental status is clear Objective: Vital signs in last 24 hours: Temp:  [97.7 F (36.5 C)-100.1 F (37.8 C)] 97.8 F (36.6 C) (07/03 0800) Pulse Rate:  [40-118] 86  (07/03 0925) Resp:  [9-28] 12  (07/03 0925) BP: (109-144)/(53-92) 119/63 mmHg (07/03 0925) SpO2:  [92 %-100 %] 99 % (07/03 0925) FiO2 (%):  [2 %-4 %] 2 % (07/02 1702)  Intake/Output from previous day: 07/02 0701 - 07/03 0700 In: 5350 [I.V.:4850; IV Piggyback:500] Out: 1910 [Urine:1910] Intake/Output this shift: Total I/O In: 400 [I.V.:400] Out: 550 [Urine:550]  Physical Exam: Work of breathing is  Normal.  Minimal pain.  UGI showed emptying of pouch  Lab Results:  Results for orders placed during the hospital encounter of 08/11/11 (from the past 48 hour(s))  PROTIME-INR     Status: Abnormal   Collection Time   08/11/11  8:05 AM      Component Value Range Comment   Prothrombin Time 15.4 (*) 11.6 - 15.2 seconds    INR 1.19  0.00 - 1.49   GLUCOSE, CAPILLARY     Status: Abnormal   Collection Time   08/11/11  8:12 AM      Component Value Range Comment   Glucose-Capillary 161 (*) 70 - 99 mg/dL   GLUCOSE, CAPILLARY     Status: Abnormal   Collection Time   08/11/11  1:53 PM      Component Value Range Comment   Glucose-Capillary 180 (*) 70 - 99 mg/dL   HEMOGLOBIN AND HEMATOCRIT, BLOOD     Status: Normal   Collection Time   08/11/11  2:26 PM      Component Value Range Comment   Hemoglobin 13.3  13.0 - 17.0 g/dL    HCT 88.4  16.6 - 06.3 %   CARDIAC PANEL(CRET KIN+CKTOT+MB+TROPI)     Status: Normal   Collection Time   08/11/11  4:36 PM      Component Value Range Comment   Total CK 69  7 - 232 U/L    CK, MB 1.1  0.3 - 4.0 ng/mL    Troponin I <0.30  <0.30 ng/mL    Relative Index RELATIVE INDEX IS INVALID  0.0 - 2.5   CBC     Status: Normal   Collection Time   08/11/11  4:38  PM      Component Value Range Comment   WBC 9.7  4.0 - 10.5 K/uL    RBC 4.34  4.22 - 5.81 MIL/uL    Hemoglobin 13.5  13.0 - 17.0 g/dL    HCT 01.6  01.0 - 93.2 %    MCV 94.0  78.0 - 100.0 fL    MCH 31.1  26.0 - 34.0 pg    MCHC 33.1  30.0 - 36.0 g/dL    RDW 35.5  73.2 - 20.2 %    Platelets 189  150 - 400 K/uL   BASIC METABOLIC PANEL     Status: Abnormal   Collection Time   08/11/11  4:38 PM      Component Value Range Comment   Sodium 134 (*) 135 - 145 mEq/L    Potassium 4.3  3.5 - 5.1 mEq/L    Chloride 106  96 - 112 mEq/L    CO2 21  19 - 32 mEq/L    Glucose, Bld 153 (*) 70 -  99 mg/dL    BUN 25 (*) 6 - 23 mg/dL    Creatinine, Ser 1.61 (*) 0.50 - 1.35 mg/dL    Calcium 8.8  8.4 - 09.6 mg/dL    GFR calc non Af Amer 42 (*) >90 mL/min    GFR calc Af Amer 48 (*) >90 mL/min   TSH     Status: Normal   Collection Time   08/11/11  4:38 PM      Component Value Range Comment   TSH 3.712  0.350 - 4.500 uIU/mL   GLUCOSE, CAPILLARY     Status: Abnormal   Collection Time   08/11/11  4:48 PM      Component Value Range Comment   Glucose-Capillary 147 (*) 70 - 99 mg/dL    Comment 1 Documented in Chart      Comment 2 Notify RN     GLUCOSE, CAPILLARY     Status: Abnormal   Collection Time   08/11/11  7:25 PM      Component Value Range Comment   Glucose-Capillary 136 (*) 70 - 99 mg/dL    Comment 1 Documented in Chart      Comment 2 Notify RN     CARDIAC PANEL(CRET KIN+CKTOT+MB+TROPI)     Status: Normal   Collection Time   08/11/11 10:00 PM      Component Value Range Comment   Total CK 77  7 - 232 U/L    CK, MB 1.0  0.3 - 4.0 ng/mL    Troponin I <0.30  <0.30 ng/mL    Relative Index RELATIVE INDEX IS INVALID  0.0 - 2.5   GLUCOSE, CAPILLARY     Status: Abnormal   Collection Time   08/12/11 12:46 AM      Component Value Range Comment   Glucose-Capillary 151 (*) 70 - 99 mg/dL   CARDIAC PANEL(CRET KIN+CKTOT+MB+TROPI)     Status: Normal   Collection Time   08/12/11  3:47 AM      Component Value Range  Comment   Total CK 72  7 - 232 U/L    CK, MB 0.7  0.3 - 4.0 ng/mL    Troponin I <0.30  <0.30 ng/mL    Relative Index RELATIVE INDEX IS INVALID  0.0 - 2.5   BASIC METABOLIC PANEL     Status: Abnormal   Collection Time   08/12/11  3:47 AM      Component Value Range Comment   Sodium 132 (*) 135 - 145 mEq/L    Potassium 3.8  3.5 - 5.1 mEq/L    Chloride 105  96 - 112 mEq/L    CO2 18 (*) 19 - 32 mEq/L    Glucose, Bld 155 (*) 70 - 99 mg/dL    BUN 17  6 - 23 mg/dL    Creatinine, Ser 0.45  0.50 - 1.35 mg/dL    Calcium 8.4  8.4 - 40.9 mg/dL    GFR calc non Af Amer 54 (*) >90 mL/min    GFR calc Af Amer 63 (*) >90 mL/min   CBC     Status: Normal   Collection Time   08/12/11  3:47 AM      Component Value Range Comment   WBC 8.9  4.0 - 10.5 K/uL    RBC 4.27  4.22 - 5.81 MIL/uL    Hemoglobin 13.1  13.0 - 17.0 g/dL    HCT 81.1  91.4 - 78.2 %    MCV 93.0  78.0 - 100.0  fL    MCH 30.7  26.0 - 34.0 pg    MCHC 33.0  30.0 - 36.0 g/dL    RDW 04.5  40.9 - 81.1 %    Platelets 179  150 - 400 K/uL   DIFFERENTIAL     Status: Abnormal   Collection Time   08/12/11  3:47 AM      Component Value Range Comment   Neutrophils Relative 77  43 - 77 %    Neutro Abs 6.9  1.7 - 7.7 K/uL    Lymphocytes Relative 11 (*) 12 - 46 %    Lymphs Abs 1.0  0.7 - 4.0 K/uL    Monocytes Relative 11  3 - 12 %    Monocytes Absolute 1.0  0.1 - 1.0 K/uL    Eosinophils Relative 0  0 - 5 %    Eosinophils Absolute 0.0  0.0 - 0.7 K/uL    Basophils Relative 0  0 - 1 %    Basophils Absolute 0.0  0.0 - 0.1 K/uL   GLUCOSE, CAPILLARY     Status: Abnormal   Collection Time   08/12/11  3:59 AM      Component Value Range Comment   Glucose-Capillary 153 (*) 70 - 99 mg/dL   GLUCOSE, CAPILLARY     Status: Abnormal   Collection Time   08/12/11  7:43 AM      Component Value Range Comment   Glucose-Capillary 165 (*) 70 - 99 mg/dL    Comment 1 Documented in Chart      Comment 2 Notify RN       Radiology/Results: No results  found.  Anti-infectives: Anti-infectives     Start     Dose/Rate Route Frequency Ordered Stop   08/11/11 0944   cefOXitin (MEFOXIN) 2 g in dextrose 5 % 50 mL IVPB        2 g 100 mL/hr over 30 Minutes Intravenous 60 min pre-op 08/11/11 0935 08/11/11 1000          Assessment/Plan: Problem List: Patient Active Problem List  Diagnosis  . HERPES ZOSTER W/NERVOUS COMPLICATION NEC  . DM  . HYPERCHOLESTEROLEMIA  . UNSPECIFIED ANEMIA  . ANXIETY  . DEPRESSION  . RESTLESS LEG SYNDROME  . HYPERTENSION  . ATRIAL FIBRILLATION, PAROXYSMAL  . ALLERGIC RHINITIS CAUSE UNSPECIFIED  . OTHER DISEASES OF NASAL CAVITY AND SINUSES  . ASTHMA, PERSISTENT, MILD  . GASTROPARESIS  . DIVERTICULOSIS, COLON  . IRRITABLE BOWEL SYNDROME  . RENAL INSUFFICIENCY  . HYDRONEPHROSIS, RIGHT  . LOW BACK PAIN, CHRONIC  . OSTEOPOROSIS, DRUG-INDUCED  . FOOT SURGERY, HX OF  . KNEE REPLACEMENT, LEFT, HX OF  . Acute venous embolism and thrombosis of deep vessels of proximal lower extremity  . Diverticulitis of colon  . Obesity, diabetes, and hypertension syndrome  . GERD (gastroesophageal reflux disease)  . Nonsurgical dumping syndrome  . Bacterial overgrowth syndrome  . Obesity  . Dyspnea  . Acute asthma exacerbation  . Chronic cough  . Preop pulmonary/respiratory exam  . Atrial fibrillation, permanent    Start PD 1 bariatric diet.  1 Day Post-Op    LOS: 1 day   [name], MD, [facility] [phone] beeper [phone]  08/12/2011 11:42 AM

## 2011-08-13 LAB — GLUCOSE, CAPILLARY
Glucose-Capillary: 132 mg/dL — ABNORMAL HIGH (ref 70–99)
Glucose-Capillary: 141 mg/dL — ABNORMAL HIGH (ref 70–99)
Glucose-Capillary: 131 mg/dL — ABNORMAL HIGH (ref 70–99)

## 2011-08-13 LAB — CBC
MCV: 94.2 fL (ref 78.0–100.0)
Platelets: 181 10*3/uL (ref 150–400)
RBC: 4.11 MIL/uL — ABNORMAL LOW (ref 4.22–5.81)
RDW: 14.3 % (ref 11.5–15.5)
WBC: 8 10*3/uL (ref 4.0–10.5)

## 2011-08-13 LAB — DIFFERENTIAL
Basophils Absolute: 0.1 10*3/uL (ref 0.0–0.1)
Eosinophils Relative: 2 % (ref 0–5)
Lymphocytes Relative: 18 % (ref 12–46)
Lymphs Abs: 1.4 10*3/uL (ref 0.7–4.0)
Neutro Abs: 5.4 10*3/uL (ref 1.7–7.7)

## 2011-08-13 LAB — BASIC METABOLIC PANEL
CO2: 19 mEq/L (ref 19–32)
Calcium: 8.9 mg/dL (ref 8.4–10.5)
Chloride: 104 mEq/L (ref 96–112)
Creatinine, Ser: 1.24 mg/dL (ref 0.50–1.35)
GFR calc Af Amer: 67 mL/min — ABNORMAL LOW (ref >90)
Sodium: 134 mEq/L — ABNORMAL LOW (ref 135–145)

## 2011-08-13 MED ORDER — ALBUTEROL SULFATE (5 MG/ML) 0.5% IN NEBU: 2.5000 mg | INHALATION_SOLUTION | Freq: Four times a day (QID) | RESPIRATORY_TRACT | Status: DC | PRN

## 2011-08-13 NOTE — Progress Notes (Signed)
   TELEMETRY: Off telemetry: Filed Vitals:   08/12/11 1800 08/12/11 2137 08/13/11 0140 08/13/11 0600  BP: 145/83 134/79 130/86 123/80  Pulse: 98 109 91 92  Temp: 98.3 F (36.8 C) 98.2 F (36.8 C) 97.8 F (36.6 C) 97.6 F (36.4 C)  TempSrc: Oral Oral Oral Oral  Resp: 18 18 20 20   Height:      Weight:      SpO2: 97% 95% 93% 97%    Intake/Output Summary (Last 24 hours) at 08/13/11 0659 Last data filed at 08/13/11 0500  Gross per 24 hour  Intake   1820 ml  Output   1600 ml  Net    220 ml    SUBJECTIVE  No chest pain or SOB.  LABS: Basic Metabolic Panel:  Basename 08/13/11 0430 08/12/11 0347  NA 134* 132*  K 3.7 3.8  CL 104 105  CO2 19 18*  GLUCOSE 153* 155*  BUN 11 17  CREATININE 1.24 1.30  CALCIUM 8.9 8.4  MG -- --  PHOS -- --   CBC:  Basename 08/13/11 0430 08/12/11 1756 08/12/11 0347  WBC 8.0 -- 8.9  NEUTROABS 5.4 -- 6.9  HGB 12.9* 12.5* --  HCT 38.7* 37.6* --  MCV 94.2 -- 93.0  PLT 181 -- 179   Cardiac Enzymes:  Basename 08/12/11 0347 08/11/11 2200 08/11/11 1636  CKTOTAL 72 77 69  CKMB 0.7 1.0 1.1  CKMBINDEX -- -- --  TROPONINI <0.30 <0.30 <0.30   Thyroid Function Tests:  Basename 08/11/11 1638  TSH 3.712  T4TOTAL --  T3FREE --  THYROIDAB --    ECHO:Study Conclusions  - Left ventricle: The cavity size was normal. Wall thickness was increased in a pattern of mild LVH. Systolic function was normal. The estimated ejection fraction was in the range of 55% to 60%. Wall motion was normal; there were no regional wall motion abnormalities. - Left atrium: The atrium was mildly to moderately dilated. Transthoracic echocardiography. M-mode, complete 2D, spectral Doppler, and color Doppler. Height: Height: 190.5cm. Height: 75in. Weight: Weight: 126.1kg. Weight: 277.4lb. Body mass index: BMI: 34.7kg/m^2. Body surface area: BSA: 2.44m^2. Blood pressure: 139/84. Patient status: Inpatient. Location: ICU/CCU   Lower extremity doppler negative for  DVT  PHYSICAL EXAM General: Well developed, well nourished, in no acute distress. Head: Normal Neck: Negative for carotid bruits. JVD not elevated. Lungs: Clear bilaterally to auscultation without wheezes, rales, or rhonchi. Breathing is unlabored. Heart: IRRR S1 S2 without murmurs, rubs, or gallops.  Abdomen: Soft, Mild tenderness without rebound. Msk:  Strength and tone appears normal for age. Extremities: No clubbing, cyanosis or edema.  Distal pedal pulses are 2+ and equal bilaterally. Neuro: Alert and oriented X 3. Moves all extremities spontaneously. Psych:  Responds to questions appropriately with a normal affect.  ASSESSMENT AND PLAN: 1. Atrial fibrillation-  Permanent. HR controlled on metoprolol. Echo results are good. Resume Coumadin when OK from surgery standpoint. Continue current dose of Toprol. No other active cardiac problems. I will sign off. Please call with further questions/problems.  2. S/p roux-en-Y gastric bypass.  3. DM type 2  4. HTN controlled.  Active Problems:  DM  HYPERTENSION  Atrial fibrillation, permanent    Signed, Goro Wenrick [place] MD,FACC 08/13/2011 6:59 AM

## 2011-08-13 NOTE — Progress Notes (Signed)
Pt alert and oriented;wife at bedside; VSS; CBG's trending down; pt states he feels much better today; tolerating water well; will advance to protein shakes today; denies any nausea or vomiting; +flatus; +burping; +BM; voiding without difficulty; ambulating in hallways without difficulty; c/o some abdominal discomfort and headache with relief from prn meds; using incentive spirometer; encouraged ambulation today; pt already has follow up appts with [facility] and [facility] and also with Endocrinologist; discharge instructions reviewed and pt and wife verbalized understanding of; pt aware of support group and BELT program. Continue current plan of care.   GASTRIC BYPASS/SLEEVE DISCHARGE INSTRUCTIONS  Drs. Fredrik Rigger, [place], [place], and [place] Call if you have any problems.   Call [phone] and ask for the surgeon on call.    If you need immediate assistance come to the ER at [facility]. Tell the ER personnel that you are a new post-op gastric bypass patient. Signs and symptoms to report:   Severe vomiting or nausea. If you cannot tolerate clear liquids for longer than 1 day, you need to call your surgeon.    Abdominal pain which does not get better after taking your pain medication   Fever greater than 101 F degree   Difficulty breathing   Chest pain    Redness, swelling, drainage, or foul odor at incision sites    If your incisions open or pull apart   Swelling or pain in calf (lower leg)   Diarrhea, frequent watery, uncontrolled bowel movements.   Constipation, (no bowel movements for 3 days) if this occurs, Take Milk of Magnesia, 2 tablespoons by mouth, 3 times a day for 2 days if needed.  Call your doctor if constipation continues. Stop taking Milk of Magnesia once you have had a bowel movement. You may also use Miralax according to the label instructions.   Anything you consider "abnormal for you".   Normal side effects after Surgery:   Unable to sleep at night or concentrate    Irritability   Being tearful (crying) or depressed   These are common complaints, possibly related to your anesthesia, stress of surgery and change in lifestyle, that usually go away a few weeks after surgery.  If these feelings continue, call your medical doctor.  Wound Care You may have surgical glue, steri-strips, or staples over your incisions after surgery.  Surgical glue:  Looks like a clear film over your incisions and will wear off gradually. Steri-strips: Strips of tape over your incisions. You may notice a yellowish color on the skin underneath the steri-strips. This is a substance used to make the steri-strips stick better. Do not pull the steri-strips off - let them fall off.  Staples: Cherlynn Polo may be removed before you leave the hospital. If you go home with staples, call [facility] [phone]) for an appointment with your surgeon's nurse to have staples removed in 7 - 10 days. Showering: You may shower two days after your surgery unless otherwise instructed by your surgeon. Wash gently around wounds with warm soapy water, rinse well, and gently pat dry.  If you have a drain, you may need someone to hold this while you shower. Avoid tub baths until staples are removed and incisions are healed.    Medications   Medications should be liquid or crushed if larger than the size of a dime.  Extended release pills should not be crushed.   Depending on the size and number of medications you take, you may need to stagger/change the time you take your  medications so that you do not over-fill your pouch.    Make sure you follow-up with your primary care physician to make medication adjustments needed during rapid weight loss and life-style adjustment.   If you are diabetic, follow up with the doctor that prescribes your diabetes medication(s) within one week after surgery and check your blood sugar regularly.   Do not drive while taking narcotics!   Do not take acetaminophen  (Tylenol) and Roxicet or Lortab Elixir at the same time since these pain medications contain acetaminophen.  Diet at home: (First 2 Weeks) You will see the nutritionist two weeks after your surgery. She will advance your diet if you are tolerating liquids well. Once at home, if you have severe vomiting or nausea and cannot tolerate clear liquids lasting longer than 1 day, call your surgeon.  Begin high protein shake 2 ounces every 3 hours, 5 - 6 times per day.  Gradually increase the amount you drink as tolerated.  You may find it easier to slowly sip shakes throughout the day.  It is important to get your proteins in first.   Protein Shake   Drink at least 2 ounces of shake 5-6 times per day   Each serving of protein shakes should have a minimum of 15 grams of protein and no more than 5 grams of carbohydrate    Increase the amount of protein shake you drink as tolerated   Protein powder may be added to fluids such as non-fat milk or Lactaid milk (limit to 20 grams added protein powder per serving   The initial goal is to drink at least 8 ounces of protein shake/drink per day (or as directed by the nutritionist). Some examples of protein shakes are [employer], [employer], EAS Edge HP, and [employer]. Hydration   Gradually increase the amount of water and other liquids as tolerated (See Acceptable Fluids)   Gradually increase the amount of protein shake as tolerated     Sip fluids slowly and throughout the day   May use Sugar substitutes, use sparingly (limit to 6 - 8 packets per day). Your fluid goal is 64 ounces of fluid daily. It may take a few weeks to build up to this.         32 oz (or more) should be clear liquids and 32 oz (or more) should be full liquids.         Liquids should not contain sugar, caffeine, or carbonation! Acceptable Fluids Clear Liquids:   Water or Sugar-free flavored water, Fruit H2O   Decaffeinated coffee or tea (sugar-free)   Crystal Lite, Wyler's Lite,  Minute Maid Lite   Sugar-free Jell-O   Bouillon or broth   Sugar-free Popsicle:   *Less than 20 calories each; Limit 1 per day   Full Liquids:              Protein Shakes/Drinks + 2 choices per day of other full liquids shown below.    Other full liquids must be: No more than 12 grams of Carbs per serving,  No more than 3 grams of Fat per serving   Strained low-fat cream soup   Non-Fat milk   Fat-free Lactaid Milk   Sugar-free yogurt (Dannon Lite & Fit) Vitamins and Minerals (Start 1 day after surgery unless otherwise directed)   2 Chewable Multivitamin / Multimineral Supplement (i.e. Centrum for Adults)   Chewable Calcium Citrate with Vitamin D-3. Take 1500 mg each day.           (  Example: 3 Chewable Calcium Plus 600 with Vitamin D-3 can be found at [facility])         Vitamin B-12, 350 - 500 micrograms (oral tablet) each day   Do not mix multivitamins containing iron with calcium supplements; take 2 hours   apart   Do not substitute Tums (calcium carbonate) for your calcium   Menstruating women and those at risk for anemia may need extra iron. Talk with your doctor to see if you need additional iron.    If you need extra iron:  Total daily Iron recommendations (including Vitamins) = 50 - 100 mg Iron/day Do not stop taking or change any vitamins or minerals until you talk to your nutritionist or surgeon. Your nutritionist and / or physician must approve all vitamin and mineral supplements. Exercise For maximum success, begin exercising as soon as your doctor recommends. Make sure your physician approves any physical activity.   Depending on fitness level, begin with a simple walking program   Walk 5-15 minutes each day, 7 days per week.    Slowly increase until you are walking 30-45 minutes per day   Consider joining our BELT program. [phone] or email [email or web address] Things to remember:    You may have sexual relations when you feel comfortable. It is VERY important for male patients  to use a reliable birth control method. Fertility often increases after surgery. Do not get pregnant for at least 18 months.   It is very important to keep all follow up appointments with your surgeon, nutritionist, primary care physician, and behavioral health practitioner. After the first year, please follow up with your bariatric surgeon at least once a year in order to maintain best weight loss results.  [facility]: [phone] [facility]: [phone]   Free counseling is available for you and your family through collaboration between [facility] and [place]. Please call [phone] and leave a message.    Consider purchasing a medical alert bracelet that says you had gastric bypass surgery.    [facility] has a free Bariatric Surgery Support Group that meets monthly, the 3rd Thursday, 6 pm, Classroom #1, [place]. You may register online at [email or web address], but registration is not necessary. Select Classes and Support Groups, Bariatric Surgery, or Call [phone]   Do not return to work or drive until cleared by your surgeon   Use your CPAP when sleeping if applicable   Do not lift anything greater than ten pounds for at least two weeks  Talmadge Chad, RN Bariatric Nurse Coordinator

## 2011-08-13 NOTE — Progress Notes (Signed)
Patient ID: [name], male   DOB: [birth year], 71 y.o.   MRN: [id number] 2 Days Post-Op  Subjective: States pain is better today. Tolerating water without nausea or pain. Has had gas and bowel movement. Ambulated yesterday.  Objective: Vital signs in last 24 hours: Temp:  [97.6 F (36.4 C)-98.4 F (36.9 C)] 97.6 F (36.4 C) (07/04 0600) Pulse Rate:  [86-109] 92  (07/04 0600) Resp:  [12-23] 20  (07/04 0600) BP: (119-145)/(63-90) 123/80 mmHg (07/04 0600) SpO2:  [92 %-99 %] 97 % (07/04 0600)    Intake/Output from previous day: 07/03 0701 - 07/04 0700 In: 2520 [P.O.:120; I.V.:2400] Out: 1600 [Urine:1600] Intake/Output this shift: Total I/O In: -  Out: 300 [Urine:300]  General appearance: alert and mild distress Resp: clear to auscultation bilaterally GI: normal findings: soft, non-tender Incision/Wound: clean and dry  Lab Results:   Basename 08/13/11 0430 08/12/11 1756 08/12/11 0347  WBC 8.0 -- 8.9  HGB 12.9* 12.5* --  HCT 38.7* 37.6* --  PLT 181 -- 179   BMET  Basename 08/13/11 0430 08/12/11 0347  NA 134* 132*  K 3.7 3.8  CL 104 105  CO2 19 18*  GLUCOSE 153* 155*  BUN 11 17  CREATININE 1.24 1.30  CALCIUM 8.9 8.4     Studies/Results: Dg Ugi W/water Sol Cm  08/12/2011  *RADIOLOGY REPORT*  Clinical Data:  Postop gastric bypass  UPPER GI SERIES WITH KUB  Technique:  Routine upper GI series was performed with 50 ml Omnipaque-300  Fluoroscopy Time: 1.4 minutes  Comparison:  Upper GI on 01/06/2011  Findings: A preliminary film of the abdomen shows a mild ileus postoperatively.  Hardware for fusion of L3-4 and L4-5 is noted as well as fusion of the SI joints.  Vertebroplasty is present at the L1 level.  The patient ingested water soluble contrast and images over the gastroesophageal junction were performed. Water soluble contrast flows freely into the gastric pouch, and then into the jejunum through the anastomosis.  No obstruction to the flow of contrast is seen and  no extravasation is noted.  Delayed films show the more distal small bowel to be normal in caliber with no complication evident.  IMPRESSION: Water soluble contrast flows freely into the gastric pouch and into the jejunum with no complicating features.  Original Report Authenticated By: Juline Patch, M.D.    Anti-infectives: Anti-infectives     Start     Dose/Rate Route Frequency Ordered Stop   08/11/11 0944   cefOXitin (MEFOXIN) 2 g in dextrose 5 % 50 mL IVPB        2 g 100 mL/hr over 30 Minutes Intravenous 60 min pre-op 08/11/11 0935 08/11/11 1000          Assessment/Plan: s/p Procedure(s): LAPAROSCOPIC ROUX-EN-Y GASTRIC BYPASS WITH UPPER ENDOSCOPY Stable postoperatively. Start protein shakes today. Possibly home tomorrow.   LOS: 2 days    Jerricka Carvey T 08/13/2011

## 2011-08-14 LAB — GLUCOSE, CAPILLARY
Glucose-Capillary: 113 mg/dL — ABNORMAL HIGH (ref 70–99)
Glucose-Capillary: 129 mg/dL — ABNORMAL HIGH (ref 70–99)

## 2011-08-14 LAB — BASIC METABOLIC PANEL
BUN: 11 mg/dL (ref 6–23)
GFR calc non Af Amer: 61 mL/min — ABNORMAL LOW (ref >90)
Glucose, Bld: 127 mg/dL — ABNORMAL HIGH (ref 70–99)
Potassium: 4.4 mEq/L (ref 3.5–5.1)

## 2011-08-14 MED ORDER — OXYCODONE-ACETAMINOPHEN 5-325 MG/5ML PO SOLN: 5.0000 mL | ORAL | Status: DC | PRN

## 2011-08-14 NOTE — Progress Notes (Signed)
Discharge instructions reviewed with patient and spouse, questions and concerns answered, prescription given to patient by MD, pt to follow up with MD Daphine Deutscher, vital signs are stable, and incision within normal limits, pt passing gas and has had a bm, no nausea or vomiting, pt is tolerating clear liquid diet Means, Johnross Nabozny N RN 08-14-11 10:43am

## 2011-08-14 NOTE — Progress Notes (Signed)
Pt alert and oriented; wife at bedside; VSS; CBG's trending down; pt states he feels much better with exception of headache and ready for discharge; tolerating protein shakes and water well; denies any nausea or vomiting; + flatus; + BM; voiding without difficulty; ambulating in hallways without difficulty; c/o some abdominal discomfort with relief from prn meds; using incentive spirometer as directed; pt already has follow up appts with [facility] and [facility]; will follow up with endocrinologist regarding insulin requirements; discharge instructions reviewed and pt and wife verbalized understanding of.  GASTRIC BYPASS/SLEEVE DISCHARGE INSTRUCTIONS  Drs. Fredrik Rigger, [place]ox[place], [place], and [place] Call if you have any problems.   Call [phone] and ask for the surgeon on call.    If you need immediate assistance come to the ER at [facility]. Tell the ER personnel that you are a new post-op gastric bypass patient. Signs and symptoms to report:   Severe vomiting or nausea. If you cannot tolerate clear liquids for longer than 1 day, you need to call your surgeon.    Abdominal pain which does not get better after taking your pain medication   Fever greater than 101 F degree   Difficulty breathing   Chest pain    Redness, swelling, drainage, or foul odor at incision sites    If your incisions open or pull apart   Swelling or pain in calf (lower leg)   Diarrhea, frequent watery, uncontrolled bowel movements.   Constipation, (no bowel movements for 3 days) if this occurs, Take Milk of Magnesia, 2 tablespoons by mouth, 3 times a day for 2 days if needed.  Call your doctor if constipation continues. Stop taking Milk of Magnesia once you have had a bowel movement. You may also use Miralax according to the label instructions.   Anything you consider "abnormal for you".   Normal side effects after Surgery:   Unable to sleep at night or concentrate   Irritability   Being tearful (crying) or depressed   These  are common complaints, possibly related to your anesthesia, stress of surgery and change in lifestyle, that usually go away a few weeks after surgery.  If these feelings continue, call your medical doctor.  Wound Care You may have surgical glue, steri-strips, or staples over your incisions after surgery.  Surgical glue:  Looks like a clear film over your incisions and will wear off gradually. Steri-strips: Strips of tape over your incisions. You may notice a yellowish color on the skin underneath the steri-strips. This is a substance used to make the steri-strips stick better. Do not pull the steri-strips off - let them fall off.  Staples: Cherlynn Polo may be removed before you leave the hospital. If you go home with staples, call [facility] [phone]) for an appointment with your surgeon's nurse to have staples removed in 7 - 10 days. Showering: You may shower two days after your surgery unless otherwise instructed by your surgeon. Wash gently around wounds with warm soapy water, rinse well, and gently pat dry.  If you have a drain, you may need someone to hold this while you shower. Avoid tub baths until staples are removed and incisions are healed.    Medications   Medications should be liquid or crushed if larger than the size of a dime.  Extended release pills should not be crushed.   Depending on the size and number of medications you take, you may need to stagger/change the time you take your medications so that you do not over-fill  your pouch.    Make sure you follow-up with your primary care physician to make medication adjustments needed during rapid weight loss and life-style adjustment.   If you are diabetic, follow up with the doctor that prescribes your diabetes medication(s) within one week after surgery and check your blood sugar regularly.   Do not drive while taking narcotics!   Do not take acetaminophen (Tylenol) and Roxicet or Lortab Elixir at the same time since  these pain medications contain acetaminophen.  Diet at home: (First 2 Weeks) You will see the nutritionist two weeks after your surgery. She will advance your diet if you are tolerating liquids well. Once at home, if you have severe vomiting or nausea and cannot tolerate clear liquids lasting longer than 1 day, call your surgeon.  Begin high protein shake 2 ounces every 3 hours, 5 - 6 times per day.  Gradually increase the amount you drink as tolerated.  You may find it easier to slowly sip shakes throughout the day.  It is important to get your proteins in first.   Protein Shake   Drink at least 2 ounces of shake 5-6 times per day   Each serving of protein shakes should have a minimum of 15 grams of protein and no more than 5 grams of carbohydrate    Increase the amount of protein shake you drink as tolerated   Protein powder may be added to fluids such as non-fat milk or Lactaid milk (limit to 20 grams added protein powder per serving   The initial goal is to drink at least 8 ounces of protein shake/drink per day (or as directed by the nutritionist). Some examples of protein shakes are [employer], [employer]illard[employer], EAS Edge HP, and Unjury. Hydration   Gradually increase the amount of water and other liquids as tolerated (See Acceptable Fluids)   Gradually increase the amount of protein shake as tolerated     Sip fluids slowly and throughout the day   May use Sugar substitutes, use sparingly (limit to 6 - 8 packets per day). Your fluid goal is 64 ounces of fluid daily. It may take a few weeks to build up to this.         32 oz (or more) should be clear liquids and 32 oz (or more) should be full liquids.         Liquids should not contain sugar, caffeine, or carbonation! Acceptable Fluids Clear Liquids:   Water or Sugar-free flavored water, Fruit H2O   Decaffeinated coffee or tea (sugar-free)   Crystal Lite, Wyler's Lite, Minute Maid Lite   Sugar-free Jell-O   Bouillon or broth     Sugar-free Popsicle:   *Less than 20 calories each; Limit 1 per day   Full Liquids:              Protein Shakes/Drinks + 2 choices per day of other full liquids shown below.    Other full liquids must be: No more than 12 grams of Carbs per serving,  No more than 3 grams of Fat per serving   Strained low-fat cream soup   Non-Fat milk   Fat-free Lactaid Milk   Sugar-free yogurt (Dannon Lite & Fit) Vitamins and Minerals (Start 1 day after surgery unless otherwise directed)   2 Chewable Multivitamin / Multimineral Supplement (i.e. Centrum for Adults)   Chewable Calcium Citrate with Vitamin D-3. Take 1500 mg each day.           (Example: 3 Chewable  Calcium Plus 600 with Vitamin D-3 can be found at [facility])         Vitamin B-12, 350 - 500 micrograms (oral tablet) each day   Do not mix multivitamins containing iron with calcium supplements; take 2 hours   apart   Do not substitute Tums (calcium carbonate) for your calcium   Menstruating women and those at risk for anemia may need extra iron. Talk with your doctor to see if you need additional iron.    If you need extra iron:  Total daily Iron recommendations (including Vitamins) = 50 - 100 mg Iron/day Do not stop taking or change any vitamins or minerals until you talk to your nutritionist or surgeon. Your nutritionist and / or physician must approve all vitamin and mineral supplements. Exercise For maximum success, begin exercising as soon as your doctor recommends. Make sure your physician approves any physical activity.   Depending on fitness level, begin with a simple walking program   Walk 5-15 minutes each day, 7 days per week.    Slowly increase until you are walking 30-45 minutes per day   Consider joining our BELT program. [phone] or email belt@[employer] .edu Things to remember:    You may have sexual relations when you feel comfortable. It is VERY important for male patients to use a reliable birth control method. Fertility often  increases after surgery. Do not get pregnant for at least 18 months.   It is very important to keep all follow up appointments with your surgeon, nutritionist, primary care physician, and behavioral health practitioner. After the first year, please follow up with your bariatric surgeon at least once a year in order to maintain best weight loss results.  [facility]: [phone] [facility]: [phone]   Free counseling is available for you and your family through collaboration between [facility] and [facility]. Please call [phone] and leave a message.    Consider purchasing a medical alert bracelet that says you had gastric bypass surgery.    The [facility] has a free Bariatric Surgery Support Group that meets monthly, the 3rd Thursday, 6 pm, Classroom #1, [place]. You may register online at [email or web address], but registration is not necessary. Select Classes and Support Groups, Bariatric Surgery, or Call [phone]   Do not return to work or drive until cleared by your surgeon   Use your CPAP when sleeping if applicable   Do not lift anything greater than ten pounds for at least two weeks  Talmadge Chad, RN Bariatric Nurse Coordinator

## 2011-08-14 NOTE — Care Management Note (Signed)
    Page 1 of 1   08/14/2011     4:17:35 PM   CARE MANAGEMENT NOTE 08/14/2011  Patient:  [name], [name]   Account Number:  [id number]  Date Initiated:  08/14/2011  Documentation initiated by:  Lanier Clam  Subjective/Objective Assessment:   ADMITTED W/DM     Action/Plan:   FROM HOME   Anticipated DC Date:  08/14/2011   Anticipated DC Plan:  HOME/SELF CARE      DC Planning Services  CM consult      Choice offered to / List presented to:             Status of service:  Completed, signed off Medicare Important Message given?   (If response is "NO", the following Medicare IM given date fields will be blank) Date Medicare IM given:   Date Additional Medicare IM given:    Discharge Disposition:  HOME/SELF CARE  Per UR Regulation:  Reviewed for med. necessity/level of care/duration of stay  If discussed at Long Length of Stay Meetings, dates discussed:    Comments:

## 2011-08-14 NOTE — Discharge Summary (Signed)
Physician Discharge Summary  Patient ID: [name] MRN: [id number] DOB/AGE: [birth year] 71 y.o.  Admit date: 08/11/2011 Discharge date: 08/14/2011  Admission Diagnoses:  Obesity and DM  Discharge Diagnoses:  Same  Active Problems:  DM  HYPERTENSION  Atrial fibrillation, permanent   Surgery:  Laparoscopic Roux en Y Gastric Bypass  Discharged Condition: improved  Hospital Course:   Had surgery and went to stepdown for rate control.  Did well  Consults: none  Significant Diagnostic Studies: UGI postop looked good    Discharge Exam: Blood pressure 139/79, pulse 79, temperature 98.1 F (36.7 C), temperature source Oral, resp. rate 20, height 6\' 3"  (1.905 m), weight 278 lb 4 oz (126.213 kg), SpO2 97.00%. Incisions OK.  Minimal pain.   Disposition: 01-Home or Self Care  Discharge Orders    Future Appointments: Provider: Department: Dept Phone: Center:   08/25/2011 4:00 PM [facility] [phone] NDM   09/03/2011 11:30 AM Valarie Merino, MD [facility] [phone] None     Future Orders Please Complete By Expires   Diet - low sodium heart healthy      Increase activity slowly      Discharge instructions      Comments:   Resume Coumadin tomorrow   No wound care        Medication List  As of 08/14/2011 10:10 AM   TAKE these medications         albuterol (2.5 MG/3ML) 0.083% nebulizer solution   Commonly known as: PROVENTIL   Take 2.5 mg by nebulization every 6 (six) hours as needed. WHEEZING AND SHORTNESS OF BREATH      atorvastatin 80 MG tablet   Commonly known as: LIPITOR   Take 1 tablet (80 mg total) by mouth daily.      BEPREVE 1.5 % Soln   Generic drug: Bepotastine Besilate   Place 1 drop into the left eye as needed. ONLY USES IF HIS EYE IS ITCHING BAD      fenofibrate 160 MG tablet   Take 1 tablet (160 mg total) by mouth daily.      fenofibrate 160 MG tablet   TAKE 1 TABLET BY MOUTH DAILY.      ferrous sulfate  325 (65 FE) MG tablet   Take 325 mg by mouth 2 (two) times daily. Take one tablet twice daily      FLUoxetine 40 MG capsule   Commonly known as: PROZAC   Take 1 capsule (40 mg total) by mouth daily.      furosemide 20 MG tablet   Commonly known as: LASIX   1 tab po daily prn peripheral swelling      gabapentin 600 MG tablet   Commonly known as: NEURONTIN   Take 1,200 mg by mouth 3 (three) times daily.      HYDROcodone-acetaminophen 10-325 MG per tablet   Commonly known as: NORCO   Take 1 tablet by mouth every 4 (four) hours as needed. PAIN      LEVEMIR 100 UNIT/ML injection   Generic drug: insulin detemir   Inject 70 Units into the skin daily.      loteprednol 0.2 % Susp   Commonly known as: LOTEMAX   Place 1 drop into both eyes 2 (two) times daily.      metoprolol succinate 25 MG 24 hr tablet   Commonly known as: TOPROL-XL   Take 1 tablet (25 mg total) by mouth 2 (two) times daily.  NOVOLOG FLEXPEN 100 UNIT/ML injection   Generic drug: insulin aspart   Inject 20-27 Units into the skin 3 (three) times daily.      omeprazole 40 MG capsule   Commonly known as: PRILOSEC   Take 1 capsule (40 mg total) by mouth 2 (two) times daily.      PRESCRIPTION MEDICATION      topiramate 25 MG tablet   Commonly known as: TOPAMAX   TAKE 3 TABLETS (75 MG TOTAL) BY MOUTH AT BEDTIME.      warfarin 2.5 MG tablet   Commonly known as: COUMADIN   Take 1 tablet (2.5 mg total) by mouth daily. As directed             Follow-up Information    Follow up with Valarie Merino, MD. (previously scheduled appt)    Contact information:   [facility] [address], [address] [place] [address] [phone]          Signed: Valarie Merino 08/14/2011, 10:10 AM

## 2011-08-17 ENCOUNTER — Other Ambulatory Visit: Payer: Self-pay | Admitting: *Deleted

## 2011-08-17 ENCOUNTER — Telehealth: Payer: Self-pay | Admitting: Family Medicine

## 2011-08-17 NOTE — Telephone Encounter (Signed)
Will forward to Dr. B 

## 2011-08-17 NOTE — Telephone Encounter (Signed)
°  Caller: Maryruth Eve; PCP: [name] E.; CB#: [phone];  Call regarding When To Restart Meds Post Op.  Pt has Gasstric Bypass surgery 08/11/11 and when does wife need to restart his meds.  He sees dietician 08/25/11 and surgeon 09/03/11.  When, or does he neds to restart all his med.  Now he is taking Metropolol, Coumadin, Lisinopril and  Novolog and the surgeon said to ask Dr. Ermalene Searing about other meds.  Does pt need an appt?  Please call to advise.  Cell # is  [phone] Please leave a message if no answer.

## 2011-08-18 MED ORDER — ZOLPIDEM TARTRATE 5 MG PO TABS: ORAL_TABLET | ORAL | Status: DC

## 2011-08-18 MED ORDER — FLUOXETINE HCL 20 MG PO TABS: 20.0000 mg | ORAL_TABLET | Freq: Every day | ORAL | Status: DC

## 2011-08-18 NOTE — Telephone Encounter (Signed)
Patients wife and advised

## 2011-08-18 NOTE — Telephone Encounter (Signed)
Hold cholesterol med for now (fenofibrate and lipitor). Use lasix prn swelling.. May not need. Use proventil prn wheeze. Restart ferrous sulfate as long as not constipated. Restart fluoxetine but at half dose 20 mg daily. (change in med list) Restart prilosec. Hold topamax and neurontin for now.  he should make an appt at 1 month out from surgery unless they feel he needs to be seen sooner.

## 2011-08-18 NOTE — Telephone Encounter (Signed)
rx called to pharmacy 

## 2011-08-22 ENCOUNTER — Other Ambulatory Visit: Payer: Self-pay | Admitting: Family Medicine

## 2011-08-25 ENCOUNTER — Encounter: Payer: Medicare Other | Attending: Surgery | Admitting: *Deleted

## 2011-08-25 VITALS — Ht 75.0 in | Wt 268.4 lb

## 2011-08-25 DIAGNOSIS — Z01818 Encounter for other preprocedural examination: Secondary | ICD-10-CM | POA: Insufficient documentation

## 2011-08-25 DIAGNOSIS — Z713 Dietary counseling and surveillance: Secondary | ICD-10-CM | POA: Insufficient documentation

## 2011-08-25 DIAGNOSIS — E669 Obesity, unspecified: Secondary | ICD-10-CM

## 2011-08-26 DIAGNOSIS — E1142 Type 2 diabetes mellitus with diabetic polyneuropathy: Secondary | ICD-10-CM | POA: Diagnosis not present

## 2011-08-26 DIAGNOSIS — E1149 Type 2 diabetes mellitus with other diabetic neurological complication: Secondary | ICD-10-CM | POA: Diagnosis not present

## 2011-08-26 DIAGNOSIS — E785 Hyperlipidemia, unspecified: Secondary | ICD-10-CM | POA: Diagnosis not present

## 2011-08-26 DIAGNOSIS — E669 Obesity, unspecified: Secondary | ICD-10-CM | POA: Diagnosis not present

## 2011-08-26 DIAGNOSIS — I10 Essential (primary) hypertension: Secondary | ICD-10-CM | POA: Diagnosis not present

## 2011-08-27 NOTE — Patient Instructions (Signed)
Patient to follow Phase 3A-Soft, High Protein Diet and follow-up at [facility] in 6 weeks for 2 months post-op nutrition visit for diet advancement. 

## 2011-08-27 NOTE — Progress Notes (Signed)
  Bariatric Class:  Appt start time: 1600 end time:  1700.  2 Week Post-Operative Nutrition Class  Patient was seen on 08/25/11 for [facility]ative [facility]   Surgery date: 08/11/11 Surgery type: RYGB  The following the learning objective met the patient during this course:   Identifies Phase 3A (Soft, High Proteins) Dietary Goals and will begin from 2 weeks post-operatively to 2 months post-operatively   Identifies appropriate sources of fluids and proteins   States protein recommendations and appropriate sources post-operatively  Identifies the need for appropriate texture modifications, mastication, and bite sizes when consuming solids  Identifies appropriate multivitamin and calcium sources post-operatively  Describes the need for physical activity post-operatively and will follow MD recommendations  States when to call healthcare provider regarding medication questions or post-operative complications  Handouts given during class include:  Phase 3A: Soft, High Protein Diet Handout  Follow-Up Plan: Patient will follow-up at [facility] in 6 weeks for 2 months post-op nutrition visit for diet advancement per MD.

## 2011-08-31 ENCOUNTER — Telehealth: Payer: Self-pay | Admitting: Family Medicine

## 2011-08-31 DIAGNOSIS — E119 Type 2 diabetes mellitus without complications: Secondary | ICD-10-CM

## 2011-08-31 DIAGNOSIS — E7800 Pure hypercholesterolemia, unspecified: Secondary | ICD-10-CM

## 2011-08-31 NOTE — Telephone Encounter (Signed)
Pt's wife is calling about setting up labs for Cholesterol check. Mr. Rinks had Gastric Bypass surgery and she was wondering if he needed to come in for a cholesterol check ??? I put him on the schedule but I wanted to make sure you wanted this tested ??

## 2011-08-31 NOTE — Telephone Encounter (Signed)
Yes okay for chol check.  Also have him make DM follow up following labs.

## 2011-09-02 ENCOUNTER — Encounter: Payer: Self-pay | Admitting: *Deleted

## 2011-09-03 ENCOUNTER — Telehealth: Payer: Self-pay

## 2011-09-03 ENCOUNTER — Ambulatory Visit (INDEPENDENT_AMBULATORY_CARE_PROVIDER_SITE_OTHER): Payer: Medicare Other | Admitting: Surgery

## 2011-09-03 ENCOUNTER — Encounter (INDEPENDENT_AMBULATORY_CARE_PROVIDER_SITE_OTHER): Payer: Self-pay | Admitting: Surgery

## 2011-09-03 VITALS — BP 140/84 | HR 84 | Temp 97.6°F | Resp 20 | Ht 75.0 in | Wt 263.0 lb

## 2011-09-03 DIAGNOSIS — Z9889 Other specified postprocedural states: Secondary | ICD-10-CM | POA: Insufficient documentation

## 2011-09-03 DIAGNOSIS — Z9884 Bariatric surgery status: Secondary | ICD-10-CM

## 2011-09-03 NOTE — Telephone Encounter (Signed)
pts wife wanted to verify when pt has lab appt on 09/07/11 he will get full panel; future lab orders, comprehensive metabolic,A1C and lipid (Terri verified pt will get those tests). Pt will be fasting.

## 2011-09-03 NOTE — Patient Instructions (Signed)
Gastric Bypass Surgery Care After Refer to this sheet in the next few weeks. These discharge instructions provide you with general information on caring for yourself after you leave the hospital. Your caregiver may also give you specific instructions. Your treatment has been planned according to the most current medical practices available, but unavoidable complications sometimes occur. If you have any problems or questions after discharge, call your caregiver. HOME CARE INSTRUCTIONS  Activity  Take frequent walks throughout the day. This will help to prevent blood clots. Do not sit for longer than 45 minutes to 1 hour while awake for 4 to 6 weeks after surgery.   Continue to do coughing and deep breathing exercises once you get home. This will help to prevent pneumonia.   Do not do strenuous activities, such as heavy lifting, pushing, or pulling, until after your follow-up visit with your caregiver. Do not lift anything heavier than 10 lb (4.5 kg).   Talk with your caregiver about when you may return to work and your exercise routine.   Do not drive while taking prescription pain medicine.  Nutrition  It is very important that you drink at least 80 oz (2,400 mL) of fluid a day.   You should stay on a clear liquid diet until your follow-up visit with your caregiver. Keep sugar-free, clear liquid items on hand, including:   Tea: hot or cold. Drink only decaffeinated for the first month.   Broths: clear beef, chicken, vegetable.   Others: water, sugar-free frozen ice pops, flavored water, gelatin (after 1 week).   Do not consume caffeine for 1 month. Large amounts of caffeine can cause dehydration.   A dietician may also give you specific instructions.   Follow your caregiver's recommendations about vitamins and protein requirements after surgery.  Hygiene  You may shower and wash your hair 2 days after surgery. Pat incisions dry. Do not rub incisions with a washcloth or towel.    Follow your caregiver's recommendations about baths and pools following surgery.  Pain control  If a prescription medicine was given, follow your caregiver's directions.   You may feel some gas pain caused by the carbon dioxide used to inflate your abdomen during surgery. This pain can be felt in your chest, shoulder, back, or abdominal area. Moving around often is advised.  Incision care  You may have 4 or more small incisions. They are closed with skin adhesive strips. Skin adhesive strips can get wet and will fall off on their own. Check your incisions and surrounding area daily for any redness, swelling, discoloration, fluid (drainage), or bleeding. Dark red, dried blood may appear under these coverings. This is normal.   If you have a drain, it will be removed at your follow-up visit or before you leave the hospital.   If your drain is left in, follow your caregiver's instructions on drain care.   If your drain is taken out, keep a clean, dry bandage over the drain site.  SEEK MEDICAL CARE IF:   You develop persistent nausea and vomiting.   You have pain and discomfort with swallowing.   You have pain, swelling, or warmth in the lower extremities.   You have an oral temperature above 102 F (38.9 C).   You develop chills.   Your incision sites look red, swollen, or have drainage.   Your stool is black, tarry, or maroon in color.   You are lightheaded when standing.   You notice a bruise getting larger.   You   have any questions or concerns.  SEEK IMMEDIATE MEDICAL CARE IF:   You have chest pain.   You have severe calf pain or pain not relieved by medicine.   You develop shortness of breath or difficulty breathing.   There is bright red blood coming from the drain.   You feel confused.   You have slurred speech.   You suddenly feel weak.  MAKE SURE YOU:   Understand these instructions.   Will watch your condition.   Will get help right away if you are  not doing well or get worse.  Document Released: 09/10/2003 Document Revised: 01/15/2011 Document Reviewed: 06/18/2009 Exit[employer] Patient Information 2012 [employer]. 

## 2011-09-03 NOTE — Progress Notes (Signed)
[name] 71 y.o.  Body mass index is 32.87 kg/(m^2).  Patient Active Problem List  Diagnosis  . HERPES ZOSTER W/NERVOUS COMPLICATION NEC  . DM  . HYPERCHOLESTEROLEMIA  . UNSPECIFIED ANEMIA  . ANXIETY  . DEPRESSION  . RESTLESS LEG SYNDROME  . HYPERTENSION  . ATRIAL FIBRILLATION, PAROXYSMAL  . ALLERGIC RHINITIS CAUSE UNSPECIFIED  . OTHER DISEASES OF NASAL CAVITY AND SINUSES  . ASTHMA, PERSISTENT, MILD  . GASTROPARESIS  . DIVERTICULOSIS, COLON  . IRRITABLE BOWEL SYNDROME  . RENAL INSUFFICIENCY  . HYDRONEPHROSIS, RIGHT  . LOW BACK PAIN, CHRONIC  . OSTEOPOROSIS, DRUG-INDUCED  . FOOT SURGERY, HX OF  . KNEE REPLACEMENT, LEFT, HX OF  . Acute venous embolism and thrombosis of deep vessels of proximal lower extremity  . Diverticulitis of colon  . Obesity, diabetes, and hypertension syndrome  . GERD (gastroesophageal reflux disease)  . Nonsurgical dumping syndrome  . Bacterial overgrowth syndrome  . Obesity  . Dyspnea  . Acute asthma exacerbation  . Chronic cough  . Preop pulmonary/respiratory exam  . Atrial fibrillation, permanent  . 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 Sulfacetamide Sodium     REACTION: Throat swelling  . Oxycodone Other (See Comments)    CONFUSION / HALLUCINATIONS    Past Surgical History  Procedure Date  . Back surgery     X 4   . Knee surgery     Bilateral x 6  . Artery repair     Left forearm  . Foot surgery     Left foot   . Replacement total knee     Left knee   . Breath tek h pylori 01/08/2011    Procedure: BREATH TEK H PYLORI;  Surgeon: Valarie Merino, MD;  Location: [facility];  Service: General;  Laterality: N/A;  . Eye surgery   . Hand surgery     LEFT  . Leg surgery     FOR NECROTIZING FASCITIS L LEG AND GROIN  . Gastric roux-en-y 08/11/2011    Procedure: LAPAROSCOPIC ROUX-EN-Y GASTRIC BYPASS WITH UPPER ENDOSCOPY;  Surgeon: Valarie Merino, MD;  Location: [facility];  Service: General;   Laterality: N/A;   [name], MD 1. 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 His diabetes is under control. His weight is down to 263 and a service is about 18 pound weight loss.  I think he is struggling with learning the new him. They're certain things he can't tolerate it especially in the morning. He does manage with protein shakes in the morning. I will see him again in 6 weeks in routine followup. Doing well after laparoscopic Roux-en-Y gastric bypass. [name], MD, [facility] [phone] beeper [phone]  09/03/2011 12:05 PM

## 2011-09-04 DIAGNOSIS — L821 Other seborrheic keratosis: Secondary | ICD-10-CM | POA: Diagnosis not present

## 2011-09-07 ENCOUNTER — Ambulatory Visit (INDEPENDENT_AMBULATORY_CARE_PROVIDER_SITE_OTHER): Payer: Medicare Other | Admitting: Family Medicine

## 2011-09-07 ENCOUNTER — Other Ambulatory Visit (INDEPENDENT_AMBULATORY_CARE_PROVIDER_SITE_OTHER): Payer: Medicare Other

## 2011-09-07 DIAGNOSIS — I824Y9 Acute embolism and thrombosis of unspecified deep veins of unspecified proximal lower extremity: Secondary | ICD-10-CM

## 2011-09-07 DIAGNOSIS — Z5181 Encounter for therapeutic drug level monitoring: Secondary | ICD-10-CM

## 2011-09-07 DIAGNOSIS — Z7901 Long term (current) use of anticoagulants: Secondary | ICD-10-CM | POA: Diagnosis not present

## 2011-09-07 DIAGNOSIS — E119 Type 2 diabetes mellitus without complications: Secondary | ICD-10-CM

## 2011-09-07 DIAGNOSIS — E7800 Pure hypercholesterolemia, unspecified: Secondary | ICD-10-CM

## 2011-09-07 DIAGNOSIS — I4891 Unspecified atrial fibrillation: Secondary | ICD-10-CM

## 2011-09-07 LAB — LIPID PANEL
Total CHOL/HDL Ratio: 6
VLDL: 41 mg/dL — ABNORMAL HIGH (ref 0.0–40.0)

## 2011-09-07 LAB — COMPREHENSIVE METABOLIC PANEL
ALT: 27 U/L (ref 0–53)
AST: 22 U/L (ref 0–37)
Albumin: 4 g/dL (ref 3.5–5.2)
Alkaline Phosphatase: 57 U/L (ref 39–117)
Potassium: 4.2 mEq/L (ref 3.5–5.1)
Sodium: 138 mEq/L (ref 135–145)
Total Protein: 6.7 g/dL (ref 6.0–8.3)

## 2011-09-07 LAB — POCT INR: INR: 4.8

## 2011-09-07 NOTE — Patient Instructions (Signed)
Hold x 2 days, then 2.5 mg daily, 1.25 mg wed. Check Fri

## 2011-09-10 ENCOUNTER — Ambulatory Visit (INDEPENDENT_AMBULATORY_CARE_PROVIDER_SITE_OTHER): Payer: Medicare Other | Admitting: Family Medicine

## 2011-09-10 ENCOUNTER — Encounter: Payer: Self-pay | Admitting: Family Medicine

## 2011-09-10 VITALS — BP 120/60 | HR 66 | Temp 97.5°F | Ht 75.0 in | Wt 255.0 lb

## 2011-09-10 DIAGNOSIS — E7800 Pure hypercholesterolemia, unspecified: Secondary | ICD-10-CM | POA: Diagnosis not present

## 2011-09-10 DIAGNOSIS — Z5181 Encounter for therapeutic drug level monitoring: Secondary | ICD-10-CM | POA: Diagnosis not present

## 2011-09-10 DIAGNOSIS — I824Y9 Acute embolism and thrombosis of unspecified deep veins of unspecified proximal lower extremity: Secondary | ICD-10-CM

## 2011-09-10 DIAGNOSIS — Z7901 Long term (current) use of anticoagulants: Secondary | ICD-10-CM | POA: Diagnosis not present

## 2011-09-10 DIAGNOSIS — E119 Type 2 diabetes mellitus without complications: Secondary | ICD-10-CM

## 2011-09-10 DIAGNOSIS — I4891 Unspecified atrial fibrillation: Secondary | ICD-10-CM

## 2011-09-10 DIAGNOSIS — Z9884 Bariatric surgery status: Secondary | ICD-10-CM | POA: Diagnosis not present

## 2011-09-10 DIAGNOSIS — I10 Essential (primary) hypertension: Secondary | ICD-10-CM

## 2011-09-10 LAB — POCT INR: INR: 1.8

## 2011-09-10 LAB — HM DIABETES FOOT EXAM

## 2011-09-10 MED ORDER — ZOLPIDEM TARTRATE 5 MG PO TABS: ORAL_TABLET | ORAL | Status: DC

## 2011-09-10 NOTE — Assessment & Plan Note (Signed)
Hold medicaiton.. Recheck in three months. Encouraged exercise, weight loss, healthy eating habits.

## 2011-09-10 NOTE — Assessment & Plan Note (Signed)
Follow up with cardiology. Continue coumadin.

## 2011-09-10 NOTE — Assessment & Plan Note (Signed)
No longer on medication. Followed by ENDO

## 2011-09-10 NOTE — Patient Instructions (Addendum)
Sty of lipitor and fenofibrate for now. Will recheck in 3 moths.  keep up great work on weight loss!

## 2011-09-10 NOTE — Progress Notes (Signed)
  Subjective:    Patient ID: [name], male    DOB: [birth year], 71 y.o.   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 Daphine Deutscher. He has so far lost 20 lbs in last month, but 59 lbs since 04/2011 with lifestyle changes. During the surgery her had irregular heart rate... Has follow up appt with Dr. Antoine Poche. Wt Readings from Last 3 Encounters:  09/10/11 255 lb (115.667 kg)  09/03/11 263 lb (119.296 kg)  08/25/11 268 lb 6.4 oz (121.745 kg)     Diabetes:  Well controlled currently on no medicaiton since 7/17  per Dr. Sharl Ma ENDO. Lab Results  Component Value Date   HGBA1C 6.6* 09/07/2011  Hypoglycemic episodes:None Hyperglycemic episodes:None Feet problems:None Blood Sugars averaging: FBS 135-150, later in day before meals100 eye exam within last year:Yes  Hypertension:  Stable control on metoprolol and lisinopril. Using medication without problems or lightheadedness: None Chest pain with exertion:None Edema:None Short of breath:None Average home BPs:NOt checking Other issues:  Elevated Cholesterol: Has been off lipitor 80 mg daily and fenofibrate since the surgery. Lab Results  Component Value Date   CHOL 197 09/07/2011   HDL 32.10* 09/07/2011   LDLDIRECT 131.3 09/07/2011   TRIG 205.0* 09/07/2011   CHOLHDL 6 09/07/2011  Diet compliance: great. Exercise: 20 min daily on treadmill, limited by back pain Other complaints:  Depression: Has worsened since the surgery.He is having issues dealing with diet and changes.  He has been on 20 mg of prozac. He is not interested in changing medications, increasing or counseling.  No SI, no HI.  Using ambien to sleep at night  Review of Systems  Constitutional: Negative for fever and fatigue.  HENT: Negative for congestion.   Eyes: Negative for pain.  Respiratory: Negative for shortness of breath.   Cardiovascular: Negative for chest  pain.  Gastrointestinal: Negative for abdominal pain.       Objective:   Physical Exam  Constitutional: Vital signs are normal. He appears well-developed and well-nourished.  HENT:  Head: Normocephalic.  Right Ear: Hearing normal.  Left Ear: Hearing normal.  Nose: Nose normal.  Mouth/Throat: Oropharynx is clear and moist and mucous membranes are normal.  Neck: Trachea normal. Carotid bruit is not present. No mass and no thyromegaly present.  Cardiovascular: Normal rate, regular rhythm and normal pulses.  Exam reveals no gallop, no distant heart sounds and no friction rub.   No murmur heard.      No peripheral edema  Pulmonary/Chest: Effort normal and breath sounds normal. No respiratory distress.  Skin: Skin is warm, dry and intact. No rash noted.  Psychiatric: He has a normal mood and affect. His speech is normal and behavior is normal. Thought content normal.    Diabetic foot exam: Normal inspection No skin breakdown No calluses  Normal DP pulses Normal sensation to light touch and monofilament Nails normal      Assessment & Plan:

## 2011-09-10 NOTE — Assessment & Plan Note (Signed)
Well controlled. Continue current medication.  

## 2011-09-10 NOTE — Patient Instructions (Signed)
2.5 mg daily check in 1 week, Just getting back on reg dose since surgery, being on pain meds. Has dc'd pain meds now

## 2011-09-11 DIAGNOSIS — M999 Biomechanical lesion, unspecified: Secondary | ICD-10-CM | POA: Diagnosis not present

## 2011-09-11 DIAGNOSIS — N401 Enlarged prostate with lower urinary tract symptoms: Secondary | ICD-10-CM | POA: Diagnosis not present

## 2011-09-11 DIAGNOSIS — IMO0002 Reserved for concepts with insufficient information to code with codable children: Secondary | ICD-10-CM | POA: Diagnosis not present

## 2011-09-11 DIAGNOSIS — N390 Urinary tract infection, site not specified: Secondary | ICD-10-CM | POA: Diagnosis not present

## 2011-09-11 DIAGNOSIS — M9981 Other biomechanical lesions of cervical region: Secondary | ICD-10-CM | POA: Diagnosis not present

## 2011-09-18 ENCOUNTER — Ambulatory Visit (INDEPENDENT_AMBULATORY_CARE_PROVIDER_SITE_OTHER): Payer: Medicare Other | Admitting: Family Medicine

## 2011-09-18 DIAGNOSIS — Z5181 Encounter for therapeutic drug level monitoring: Secondary | ICD-10-CM

## 2011-09-18 DIAGNOSIS — I824Y9 Acute embolism and thrombosis of unspecified deep veins of unspecified proximal lower extremity: Secondary | ICD-10-CM

## 2011-09-18 DIAGNOSIS — I4891 Unspecified atrial fibrillation: Secondary | ICD-10-CM

## 2011-09-18 DIAGNOSIS — Z7901 Long term (current) use of anticoagulants: Secondary | ICD-10-CM | POA: Diagnosis not present

## 2011-09-18 LAB — POCT INR: INR: 3.1

## 2011-09-18 NOTE — Patient Instructions (Signed)
Continue current dose, check in 2 weeks  

## 2011-09-23 ENCOUNTER — Ambulatory Visit: Payer: Medicare Other | Admitting: Cardiology

## 2011-10-02 ENCOUNTER — Ambulatory Visit (INDEPENDENT_AMBULATORY_CARE_PROVIDER_SITE_OTHER): Payer: Medicare Other | Admitting: Family Medicine

## 2011-10-02 DIAGNOSIS — Z5181 Encounter for therapeutic drug level monitoring: Secondary | ICD-10-CM | POA: Diagnosis not present

## 2011-10-02 DIAGNOSIS — Z7901 Long term (current) use of anticoagulants: Secondary | ICD-10-CM

## 2011-10-02 DIAGNOSIS — I4891 Unspecified atrial fibrillation: Secondary | ICD-10-CM

## 2011-10-02 DIAGNOSIS — I824Y9 Acute embolism and thrombosis of unspecified deep veins of unspecified proximal lower extremity: Secondary | ICD-10-CM

## 2011-10-02 NOTE — Patient Instructions (Signed)
Continue current dose, check in 4 weeks  

## 2011-10-06 ENCOUNTER — Encounter: Payer: Medicare Other | Attending: Surgery | Admitting: *Deleted

## 2011-10-06 ENCOUNTER — Encounter: Payer: Self-pay | Admitting: *Deleted

## 2011-10-06 VITALS — Ht 75.0 in | Wt 251.0 lb

## 2011-10-06 DIAGNOSIS — Z713 Dietary counseling and surveillance: Secondary | ICD-10-CM | POA: Insufficient documentation

## 2011-10-06 DIAGNOSIS — Z01818 Encounter for other preprocedural examination: Secondary | ICD-10-CM | POA: Insufficient documentation

## 2011-10-06 DIAGNOSIS — E669 Obesity, unspecified: Secondary | ICD-10-CM

## 2011-10-06 NOTE — Patient Instructions (Addendum)
Goals:  Follow Phase 3B: High Protein + Non-Starchy Vegetables  Eat 3-6 small meals/snacks, every 3-5 hrs  Increase lean protein foods to meet 80g goal  If protein is not tolerated, try to get a protein shake in  Increase fluid intake to 64oz +  Avoid drinking 15 minutes before, during and 30 minutes after eating  Aim for >30 min of physical activity daily

## 2011-10-06 NOTE — Progress Notes (Addendum)
  Follow-up visit:  8 Weeks Post-Operative RYGB Surgery  Medical Nutrition Therapy:  Appt start time: 0800 end time:  0830.  Primary concerns today: Post-operative Bariatric Surgery Nutrition Management. [name] returns today for 2 mo f/u with an additional 17 lb weight loss.  Has been trying different foods to see what his stomach can handle. Regurgitation reported with some foods (see below), only to be tolerated the next day. No trend noted. Is exercising daily and BGs trending towards normal. Recent A1c down to 6.6%.  I am concerned that he is not getting enough protein in and urged him to at least get a shake in on days he cannot tolerate solid proteins. Will follow.  Also discussed CHO intake, which at this point is not excessive, but he is consuming bread, fruit, etc.   Surgery date: 08/11/11 Surgery type: RYGB Start weight @ [facility]: 307.1 lbs Weight goal: No goal reported; only goal is to reverse diabetes  Weight today: 251.0 lbs Weight change: 17.4 lbs Total weight lost: 56.1 lbs BMI: 31.4 kg/m^2  CGM: daily Pre-Prandial: 109 mg/mL FBG: 130-132 mg/dL  40-JW recall: B (AM): Cheerios w/ soymilk (small amt) OR ~30 g protein shake Snk (AM): None  L (PM): Skips OR soup or leftovers - part of shake if can't handle Snk (PM): None  D (5 PM): 1 hotdog w/ 1 pc toasted wheat bread and tomato, light mayo Snk (PM): None  Fluid intake: Water, crystal light, protein shakes: ~ 64 oz Estimated total protein intake: ~ 30-40g  Medications: No changes Supplementation: Does not take d/t being "allergic" to them. Broke out in a rash as a child and again several years ago.  Using straws: No Drinking while eating: Sips only; helps food go down Hair loss: No Carbonated beverages: A few; helps settle stomach when food doesn't go down well N/V/D/C: Regurgitation 4-6 times/week w/ steak, pork chops and many other foods; No trend in type of food noted. Many instances of a food not tolerated one day, then  fine the next.  Dumping syndrome: None reported  Recent physical activity:  Walks on treadmill for 0.5-0.75 mile daily  Progress Towards Goal(s):  In progress.  Handouts given during visit include:   Phase 3B: High Protein + Vegetables   Nutritional Diagnosis:  NI-5.7.1 Inadequate protein intake related to recent RYGB surgery as evidenced by patient consuming <50% of recommended goal.    Intervention:  Nutrition education.  Monitoring/Evaluation:  Dietary intake, exercise, and body weight. Follow up in 1 months for 3 month post-op visit.

## 2011-10-13 ENCOUNTER — Other Ambulatory Visit: Payer: Self-pay | Admitting: *Deleted

## 2011-10-13 MED ORDER — ZOLPIDEM TARTRATE 5 MG PO TABS: ORAL_TABLET | ORAL | Status: DC

## 2011-10-13 NOTE — Telephone Encounter (Signed)
[facility] 

## 2011-10-21 ENCOUNTER — Ambulatory Visit (INDEPENDENT_AMBULATORY_CARE_PROVIDER_SITE_OTHER): Payer: Medicare Other | Admitting: Surgery

## 2011-10-21 ENCOUNTER — Encounter (INDEPENDENT_AMBULATORY_CARE_PROVIDER_SITE_OTHER): Payer: Self-pay | Admitting: Surgery

## 2011-10-21 VITALS — BP 112/74 | HR 78 | Temp 97.0°F | Resp 18 | Ht 75.0 in | Wt 245.4 lb

## 2011-10-21 DIAGNOSIS — Z9884 Bariatric surgery status: Secondary | ICD-10-CM

## 2011-10-21 NOTE — Progress Notes (Signed)
[name] 71 y.o.  Body mass index is 30.67 kg/(m^2).  Patient Active Problem List  Diagnosis  . HERPES ZOSTER W/NERVOUS COMPLICATION NEC  . DM  . HYPERCHOLESTEROLEMIA  . UNSPECIFIED ANEMIA  . ANXIETY  . DEPRESSION  . RESTLESS LEG SYNDROME  . HYPERTENSION  . ATRIAL FIBRILLATION, PAROXYSMAL  . ALLERGIC RHINITIS CAUSE UNSPECIFIED  . OTHER DISEASES OF NASAL CAVITY AND SINUSES  . ASTHMA, PERSISTENT, MILD  . GASTROPARESIS  . DIVERTICULOSIS, COLON  . IRRITABLE BOWEL SYNDROME  . RENAL INSUFFICIENCY  . LOW BACK PAIN, CHRONIC  . OSTEOPOROSIS, DRUG-INDUCED  . FOOT SURGERY, HX OF  . KNEE REPLACEMENT, LEFT, HX OF  . Acute venous embolism and thrombosis of deep vessels of proximal lower extremity  . Diverticulitis of colon  . Obesity, diabetes, and hypertension syndrome  . GERD (gastroesophageal reflux disease)  . Nonsurgical dumping syndrome  . Bacterial overgrowth syndrome  . Obesity  . Atrial fibrillation, permanent  . 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 Sulfacetamide Sodium     REACTION: Throat swelling  . Oxycodone Other (See Comments)    CONFUSION / HALLUCINATIONS    Past Surgical History  Procedure Date  . Back surgery     X 4   . Knee surgery     Bilateral x 6  . Artery repair     Left forearm  . Foot surgery     Left foot   . Replacement total knee     Left knee   . Breath tek h pylori 01/08/2011    Procedure: BREATH TEK H PYLORI;  Surgeon: Valarie Merino, MD;  Location: [facility];  Service: General;  Laterality: N/A;  . Eye surgery   . Hand surgery     LEFT  . Leg surgery     FOR NECROTIZING FASCITIS L LEG AND GROIN  . Gastric roux-en-y 08/11/2011    Procedure: LAPAROSCOPIC ROUX-EN-Y GASTRIC BYPASS WITH UPPER ENDOSCOPY;  Surgeon: Valarie Merino, MD;  Location: [facility];  Service: General;  Laterality: N/A;   [name], MD 1. 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  Has lost 65 lbs thus far.  Not taking anything for DM.  Today is is 71th birthday.   Return 2 months.  [name], MD, [facility] [phone] beeper [phone]  10/21/2011 4:14 PM

## 2011-10-21 NOTE — Patient Instructions (Signed)
Keep a food diary to share with [name] when you see her later this month.   Continue to try proteins and make sure that they are fresh and not microwaved.

## 2011-10-22 ENCOUNTER — Ambulatory Visit: Payer: Medicare Other | Admitting: Cardiology

## 2011-10-28 DIAGNOSIS — E669 Obesity, unspecified: Secondary | ICD-10-CM | POA: Diagnosis not present

## 2011-10-28 DIAGNOSIS — E1142 Type 2 diabetes mellitus with diabetic polyneuropathy: Secondary | ICD-10-CM | POA: Diagnosis not present

## 2011-10-28 DIAGNOSIS — I10 Essential (primary) hypertension: Secondary | ICD-10-CM | POA: Diagnosis not present

## 2011-10-28 DIAGNOSIS — E785 Hyperlipidemia, unspecified: Secondary | ICD-10-CM | POA: Diagnosis not present

## 2011-10-28 DIAGNOSIS — E1149 Type 2 diabetes mellitus with other diabetic neurological complication: Secondary | ICD-10-CM | POA: Diagnosis not present

## 2011-10-28 DIAGNOSIS — Z23 Encounter for immunization: Secondary | ICD-10-CM | POA: Diagnosis not present

## 2011-11-03 ENCOUNTER — Ambulatory Visit (INDEPENDENT_AMBULATORY_CARE_PROVIDER_SITE_OTHER): Payer: Medicare Other | Admitting: Family Medicine

## 2011-11-03 DIAGNOSIS — Z7901 Long term (current) use of anticoagulants: Secondary | ICD-10-CM | POA: Diagnosis not present

## 2011-11-03 DIAGNOSIS — Z5181 Encounter for therapeutic drug level monitoring: Secondary | ICD-10-CM | POA: Diagnosis not present

## 2011-11-03 DIAGNOSIS — I4891 Unspecified atrial fibrillation: Secondary | ICD-10-CM | POA: Diagnosis not present

## 2011-11-03 DIAGNOSIS — I824Y9 Acute embolism and thrombosis of unspecified deep veins of unspecified proximal lower extremity: Secondary | ICD-10-CM

## 2011-11-03 LAB — POCT INR: INR: 2.9

## 2011-11-03 NOTE — Patient Instructions (Signed)
Continue 2.5 mg daily recheck 4 weeks 

## 2011-11-06 ENCOUNTER — Encounter: Payer: Medicare Other | Attending: Surgery | Admitting: *Deleted

## 2011-11-06 ENCOUNTER — Encounter: Payer: Self-pay | Admitting: *Deleted

## 2011-11-06 ENCOUNTER — Ambulatory Visit: Payer: Medicare Other

## 2011-11-06 VITALS — Ht 75.0 in | Wt 242.9 lb

## 2011-11-06 DIAGNOSIS — Z713 Dietary counseling and surveillance: Secondary | ICD-10-CM | POA: Insufficient documentation

## 2011-11-06 DIAGNOSIS — E1159 Type 2 diabetes mellitus with other circulatory complications: Secondary | ICD-10-CM

## 2011-11-06 DIAGNOSIS — Z01818 Encounter for other preprocedural examination: Secondary | ICD-10-CM | POA: Diagnosis not present

## 2011-11-06 DIAGNOSIS — E669 Obesity, unspecified: Secondary | ICD-10-CM

## 2011-11-06 NOTE — Patient Instructions (Signed)
Goals:  Follow Phase 3B: High Protein + Non-Starchy Vegetables  Eat 3-6 small meals/snacks, every 3-5 hrs  Increase lean protein foods to meet 80g goal  If protein is not tolerated, try to get a protein shake in  Increase fluid intake to 64oz +  Avoid drinking 15 minutes before, during and 30 minutes after eating  Aim for >30 min of physical activity daily    Bring me some of your aged sharp cheddar cheese when you return. :)

## 2011-11-06 NOTE — Progress Notes (Addendum)
  Follow-up visit:  3 Months Post-Operative RYGB Surgery  Medical Nutrition Therapy:  Appt start time: 0800  End time:  0830.  Primary concerns today: Post-operative Bariatric Surgery Nutrition Management. [name] returns today for 3 mo f/u with an additional 8 lb weight loss.  Reports foods continue to stick in throat at times, but no trends noted yet.   Surgery date: 08/11/11 Surgery type: RYGB Start weight @ [facility]: 307.1 lbs Weight goal: ~215-220 lbs; main goal is to reverse diabetes  Weight today: 242.9 lbs Weight change: 8.1 lbs Total weight lost: 64.2 lbs BMI: 30.3 kg/m^2  CGM: Most days A1c: 6.6% (>9.0% pre-op)  24-hr recall: B (AM): Cheerios w/ soymilk (small amt) OR ~25-30 g protein shake w/ bananas or pineapple Snk (AM): pc of toast L (PM): [place] w/ mashed potatoes & gravy (10 bites total) OR 25-30 g protein shake Snk (PM): None OR a handful of corn chips or 2 pcs watermelon D (5 PM): [place] food (prepackaged) - Peppersteak (~1-2 oz) - 10g Snk (PM): None  Fluid intake: Water, crystal light, protein shakes, carbonated diet drink mixed with water: ~ 64 oz Estimated total protein intake: ~ 30-40g  Medications: No changes Supplementation: Does not take d/t being "allergic" to them. Broke out in a rash as a child and again several years ago.  Using straws: No Drinking while eating: Sips only; helps food go down Hair loss: No Carbonated beverages: A few; ~ 8 oz helps settle stomach when food doesn't go down well N/V/D/C: Regurgitation continues w/ many foods; No trend in type of food noted. Many instances of a food not tolerated one day, then fine the next. Cannot tolerate "grease" first thing in the morning. Also, cannot tolerate eggs, country style ham, bread, or baked potatoes  Dumping syndrome: None reported  Recent physical activity:  Walks on treadmill for 0.5-0.75 mile daily  Progress Towards Goal(s):  In progress.   Nutritional Diagnosis:  NI-5.7.1 Inadequate  protein intake related to recent RYGB surgery as evidenced by patient consuming <50% of recommended goal.    Intervention:  Nutrition education.  Monitoring/Evaluation:  Dietary intake, exercise, and body weight. Follow up in 3 months for 6 month post-op visit.

## 2011-11-13 ENCOUNTER — Other Ambulatory Visit: Payer: Self-pay | Admitting: Family Medicine

## 2011-11-13 NOTE — Telephone Encounter (Signed)
rx called into pharmacy

## 2011-11-16 ENCOUNTER — Other Ambulatory Visit: Payer: Self-pay

## 2011-11-16 MED ORDER — WARFARIN SODIUM 2.5 MG PO TABS: 2.5000 mg | ORAL_TABLET | Freq: Every day | ORAL | Status: DC

## 2011-11-16 NOTE — Telephone Encounter (Signed)
Lady left v/m requesting refill warfarin 2.[facility]. Pt notified done.

## 2011-11-17 ENCOUNTER — Ambulatory Visit (INDEPENDENT_AMBULATORY_CARE_PROVIDER_SITE_OTHER): Payer: Medicare Other | Admitting: Cardiology

## 2011-11-17 ENCOUNTER — Encounter: Payer: Self-pay | Admitting: Cardiology

## 2011-11-17 VITALS — BP 107/81 | HR 83 | Ht 75.0 in | Wt 239.0 lb

## 2011-11-17 DIAGNOSIS — I10 Essential (primary) hypertension: Secondary | ICD-10-CM | POA: Diagnosis not present

## 2011-11-17 DIAGNOSIS — I4821 Permanent atrial fibrillation: Secondary | ICD-10-CM

## 2011-11-17 DIAGNOSIS — I4891 Unspecified atrial fibrillation: Secondary | ICD-10-CM

## 2011-11-17 NOTE — Progress Notes (Signed)
HPI The patient presents for followup of atrial fibrillation. He was hospitalized earlier this year for gastric bypass. We saw him for management of his fibrillation. He did have an echocardiogram which I have reviewed and which demonstrated normal function. Since his surgery he has lost 75 pounds. He's no longer on insulin. His blood pressure medicines have been tremendously reduced. Feels much better and is exercising daily.  The patient denies any new symptoms such as chest discomfort, neck or arm discomfort. There has been no new shortness of breath, PND or orthopnea. There have been no reported palpitations, presyncope or syncope.  Allergies  Allergen Reactions  . Sulfacetamide Sodium     REACTION: Throat swelling  . Oxycodone Other (See Comments)    CONFUSION / HALLUCINATIONS    Current Outpatient Prescriptions  Medication Sig Dispense Refill  . ALREX 0.2 % SUSP       . FLUoxetine (PROZAC) 20 MG tablet Take 1 tablet (20 mg total) by mouth daily.  30 tablet  3  . furosemide (LASIX) 20 MG tablet       . HYDROcodone-acetaminophen (NORCO/VICODIN) 5-325 MG per tablet       . lisinopril (PRINIVIL,ZESTRIL) 2.5 MG tablet Take 2.5 mg by mouth daily.      . meloxicam (MOBIC) 15 MG tablet       . metoprolol succinate (TOPROL-XL) 25 MG 24 hr tablet Take 1 tablet (25 mg total) by mouth 2 (two) times daily.  180 tablet  3  . omeprazole (PRILOSEC) 40 MG capsule Take 1 capsule (40 mg total) by mouth 2 (two) times daily.  180 capsule  3  . topiramate (TOPAMAX) 25 MG tablet       . triamcinolone cream (KENALOG) 0.1 %       . warfarin (COUMADIN) 2.5 MG tablet Take 1 tablet (2.5 mg total) by mouth daily. As directed  90 tablet  3  . zolpidem (AMBIEN) 5 MG tablet TAKE 1 TABLET BY MOUTH AT BEDTIME AS NEEDED FOR INSOMNIA  30 tablet  0    Past Medical History  Diagnosis Date  . Diverticulosis of colon (without mention of hemorrhage)   . Other pulmonary embolism and infarction   . Restless legs  syndrome (RLS)   . Obesity, unspecified   . Irritable bowel syndrome   . Gastroparesis   . Unspecified essential hypertension   . Lumbago   . Depressive disorder, not elsewhere classified   . Anxiety state, unspecified   . Unspecified asthma   . Esophageal reflux   . Atrial fibrillation   . Pure hypercholesterolemia   . Type II or unspecified type diabetes mellitus without mention of complication, not stated as uncontrolled   . Hemorrhoids   . Back pain, chronic   . Hx of gout   . Bruises easily   . Cancer     SKIN CANCER REMOVED  . Sleep apnea     STOP BANG SCORE 6    Past Surgical History  Procedure Date  . Back surgery     X 4   . Knee surgery     Bilateral x 6  . Artery repair     Left forearm  . Foot surgery     Left foot   . Replacement total knee     Left knee   . Breath tek h pylori 01/08/2011    Procedure: BREATH TEK H PYLORI;  Surgeon: Valarie Merino, MD;  Location: [facility];  Service: General;  Laterality: N/A;  .  Eye surgery   . Hand surgery     LEFT  . Leg surgery     FOR NECROTIZING FASCITIS L LEG AND GROIN  . Gastric roux-en-y 08/11/2011    Procedure: LAPAROSCOPIC ROUX-EN-Y GASTRIC BYPASS WITH UPPER ENDOSCOPY;  Surgeon: Valarie Merino, MD;  Location: [facility];  Service: General;  Laterality: N/A;    ROS:  ED. Otherwise as stated in the HPI and negative for all other systems.  PHYSICAL EXAM BP 107/81  Pulse 83  Ht 6\' 3"  (1.905 m)  Wt 108.41 kg (239 lb)  BMI 29.87 kg/m2 GENERAL:  Well appearing HEENT:  Pupils equal round and reactive, fundi not visualized, oral mucosa unremarkable, dentures NECK:  No jugular venous distention, waveform within normal limits, carotid upstroke brisk and symmetric, no bruits, no thyromegaly LYMPHATICS:  No cervical, inguinal adenopathy LUNGS:  Clear to auscultation bilaterally BACK:  No CVA tenderness CHEST:  Unremarkable HEART:  PMI not displaced or sustained,S1 and S2 within normal limits, no S3, no clicks,  no rubs, no murmurs, , irregular ABD:  Flat, positive bowel sounds normal in frequency in pitch, no bruits, no rebound, no guarding, no midline pulsatile mass, no hepatomegaly, no splenomegaly, , well healed surgical scores EXT:  2 plus pulses throughout, no edema, no cyanosis no clubbing SKIN:  No rashes no nodules NEURO:  Cranial nerves II through XII grossly intact, motor grossly intact throughout PSYCH:  Cognitively intact, oriented to person place and time  EKG:  Atrial fibrillation, rate 83, axis within normal limits, intervals within normal limits, no acute ST-T wave changes. 11/17/2011  ASSESSMENT AND PLAN  ATRIAL FIBRILLATION, PAROXYSMAL He tolerates this rhythm with rate control and anticoagulation. No further testing is indicated.   HYPERTENSION  His blood pressure is under excellent control with his weight loss. No change in therapy is indicated.  OBESITY I am so proud of him for his weight loss status post surgery. We discussed this at length today.  ERECTILE DYSFUNCTION Will take the liberty of giving him samples of Cialis.  He will let me know if this works in a call for a prescription

## 2011-11-17 NOTE — Patient Instructions (Addendum)
The current medical regimen is effective;  continue present plan and medications.  Please call after using [place] or Viagra and call back for a RX.  Follow up in 1 year with Dr Antoine Poche.  You will receive a letter in the mail 2 months before you are due.  Please call us when you receive this letter to schedule your follow up appointment.

## 2011-11-26 ENCOUNTER — Telehealth: Payer: Self-pay

## 2011-11-26 NOTE — Telephone Encounter (Signed)
Patient notified as instructed by telephone that meloxicam can irritate stomach and thin blood; meloxicam with coumadin is not recommended. Pt to stop or at least limit Meloxicam use. Pt voiced understanding. Sent for scanning.

## 2011-11-26 NOTE — Telephone Encounter (Signed)
Left v/m for pt to call back due to info on [facility] form on [place] desk per Dr Ermalene Searing.

## 2011-11-27 ENCOUNTER — Ambulatory Visit (INDEPENDENT_AMBULATORY_CARE_PROVIDER_SITE_OTHER): Payer: Medicare Other | Admitting: Surgery

## 2011-11-27 ENCOUNTER — Encounter (INDEPENDENT_AMBULATORY_CARE_PROVIDER_SITE_OTHER): Payer: Self-pay | Admitting: Surgery

## 2011-11-27 VITALS — BP 100/64 | HR 84 | Temp 97.3°F | Resp 16 | Ht 75.0 in | Wt 237.6 lb

## 2011-11-27 DIAGNOSIS — Z9884 Bariatric surgery status: Secondary | ICD-10-CM

## 2011-11-27 NOTE — Progress Notes (Signed)
Steele Sizer 71 y.o.  Body mass index is 29.70 kg/(m^2).  Patient Active Problem List  Diagnosis  . HERPES ZOSTER W/NERVOUS COMPLICATION NEC  . DM  . HYPERCHOLESTEROLEMIA  . UNSPECIFIED ANEMIA  . ANXIETY  . DEPRESSION  . RESTLESS LEG SYNDROME  . HYPERTENSION  . ATRIAL FIBRILLATION, PAROXYSMAL  . ALLERGIC RHINITIS CAUSE UNSPECIFIED  . OTHER DISEASES OF NASAL CAVITY AND SINUSES  . ASTHMA, PERSISTENT, MILD  . GASTROPARESIS  . DIVERTICULOSIS, COLON  . IRRITABLE BOWEL SYNDROME  . RENAL INSUFFICIENCY  . LOW BACK PAIN, CHRONIC  . OSTEOPOROSIS, DRUG-INDUCED  . FOOT SURGERY, HX OF  . KNEE REPLACEMENT, LEFT, HX OF  . Acute venous embolism and thrombosis of deep vessels of proximal lower extremity  . Diverticulitis of colon  . Obesity, diabetes, and hypertension syndrome  . GERD (gastroesophageal reflux disease)  . Nonsurgical dumping syndrome  . Bacterial overgrowth syndrome  . Obesity  . Atrial fibrillation, permanent  . 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 Sulfacetamide Sodium     REACTION: Throat swelling  . Oxycodone Other (See Comments)    CONFUSION / HALLUCINATIONS    Past Surgical History  Procedure Date  . Back surgery     X 4   . Knee surgery     Bilateral x 6  . Artery repair     Left forearm  . Foot surgery     Left foot   . Replacement total knee     Left knee   . Breath tek h pylori 01/08/2011    Procedure: BREATH TEK H PYLORI;  Surgeon: Valarie Merino, MD;  Location: [facility];  Service: General;  Laterality: N/A;  . Eye surgery   . Hand surgery     LEFT  . Leg surgery     FOR NECROTIZING FASCITIS L LEG AND GROIN  . Gastric roux-en-y 08/11/2011    Procedure: LAPAROSCOPIC ROUX-EN-Y GASTRIC BYPASS WITH UPPER ENDOSCOPY;  Surgeon: Valarie Merino, MD;  Location: [facility];  Service: General;  Laterality: N/A;   [name], MD No diagnosis found.  73 lbs weight loss since July Roux en Y.  He has no  complaints.  DM resolved.  Hypertension better.  Having some irritation with panniculitis-will recheck that in 4 months but for now he is using medicated cream.  [name], MD, [facility] [phone] beeper [phone]  11/27/2011 4:28 PM

## 2011-11-27 NOTE — Patient Instructions (Signed)
Keep up the good work.  Congratulations on the 73 lb weight loss so far.

## 2011-12-01 ENCOUNTER — Ambulatory Visit: Payer: Medicare Other

## 2011-12-04 ENCOUNTER — Ambulatory Visit (INDEPENDENT_AMBULATORY_CARE_PROVIDER_SITE_OTHER): Payer: Medicare Other | Admitting: Family Medicine

## 2011-12-04 ENCOUNTER — Other Ambulatory Visit: Payer: Medicare Other

## 2011-12-04 DIAGNOSIS — I4891 Unspecified atrial fibrillation: Secondary | ICD-10-CM | POA: Diagnosis not present

## 2011-12-04 DIAGNOSIS — Z7901 Long term (current) use of anticoagulants: Secondary | ICD-10-CM

## 2011-12-04 DIAGNOSIS — E7800 Pure hypercholesterolemia, unspecified: Secondary | ICD-10-CM | POA: Diagnosis not present

## 2011-12-04 DIAGNOSIS — I824Y9 Acute embolism and thrombosis of unspecified deep veins of unspecified proximal lower extremity: Secondary | ICD-10-CM | POA: Diagnosis not present

## 2011-12-04 DIAGNOSIS — Z5181 Encounter for therapeutic drug level monitoring: Secondary | ICD-10-CM | POA: Diagnosis not present

## 2011-12-04 LAB — LDL CHOLESTEROL, DIRECT: Direct LDL: 142.7 mg/dL

## 2011-12-04 LAB — LIPID PANEL
HDL: 29.7 mg/dL — ABNORMAL LOW (ref >39.00)
Total CHOL/HDL Ratio: 6
VLDL: 41 mg/dL — ABNORMAL HIGH (ref 0.0–40.0)

## 2011-12-04 LAB — POCT INR: INR: 2.3

## 2011-12-04 NOTE — Patient Instructions (Signed)
Continue current dose, check in 4 weeks  

## 2011-12-08 ENCOUNTER — Other Ambulatory Visit: Payer: Self-pay | Admitting: Family Medicine

## 2011-12-08 DIAGNOSIS — Q6689 Other  specified congenital deformities of feet: Secondary | ICD-10-CM | POA: Diagnosis not present

## 2011-12-08 DIAGNOSIS — M659 Synovitis and tenosynovitis, unspecified: Secondary | ICD-10-CM | POA: Diagnosis not present

## 2011-12-08 DIAGNOSIS — E1142 Type 2 diabetes mellitus with diabetic polyneuropathy: Secondary | ICD-10-CM | POA: Diagnosis not present

## 2011-12-11 ENCOUNTER — Encounter: Payer: Self-pay | Admitting: Family Medicine

## 2011-12-11 ENCOUNTER — Ambulatory Visit (INDEPENDENT_AMBULATORY_CARE_PROVIDER_SITE_OTHER): Payer: Medicare Other | Admitting: Family Medicine

## 2011-12-11 VITALS — BP 110/82 | HR 69 | Temp 97.9°F | Resp 16 | Wt 236.0 lb

## 2011-12-11 DIAGNOSIS — G609 Hereditary and idiopathic neuropathy, unspecified: Secondary | ICD-10-CM | POA: Diagnosis not present

## 2011-12-11 DIAGNOSIS — I10 Essential (primary) hypertension: Secondary | ICD-10-CM

## 2011-12-11 DIAGNOSIS — E7800 Pure hypercholesterolemia, unspecified: Secondary | ICD-10-CM | POA: Diagnosis not present

## 2011-12-11 DIAGNOSIS — E1143 Type 2 diabetes mellitus with diabetic autonomic (poly)neuropathy: Secondary | ICD-10-CM | POA: Insufficient documentation

## 2011-12-11 DIAGNOSIS — F329 Major depressive disorder, single episode, unspecified: Secondary | ICD-10-CM

## 2011-12-11 DIAGNOSIS — E119 Type 2 diabetes mellitus without complications: Secondary | ICD-10-CM

## 2011-12-11 DIAGNOSIS — G629 Polyneuropathy, unspecified: Secondary | ICD-10-CM

## 2011-12-11 LAB — VITAMIN B12: Vitamin B-12: 191 pg/mL — ABNORMAL LOW (ref 211–911)

## 2011-12-11 NOTE — Assessment & Plan Note (Signed)
Well controlled. Continue current medication.  

## 2011-12-11 NOTE — Assessment & Plan Note (Signed)
Well controlled 

## 2011-12-11 NOTE — Assessment & Plan Note (Signed)
Followed by ENDO, well controlled on no medication.

## 2011-12-11 NOTE — Assessment & Plan Note (Signed)
Not at goal off med. Pt not interested in going back on at this point even lower dose. He would like to continue further weight loss and lifestyle changes.

## 2011-12-11 NOTE — Progress Notes (Signed)
  Subjective:    Patient ID: [name], male    DOB: 05/13/1940, 71 y.o.   MRN: [id number]  HPI  71 year old male with 70-80 lb weight loss since gastric bypass earlier in the year.  Elevated Cholesterol: LDL not at goal off medicaiton. Lab Results  Component Value Date   CHOL 193 12/04/2011   HDL 29.70* 12/04/2011   LDLDIRECT 142.7 12/04/2011   TRIG 205.0* 12/04/2011   CHOLHDL 6 12/04/2011   Previously on lipitor 80 mg daily. Has been on hold since surgery. Diet compliance:Great Exercise: Daily treamill 20-30 min Other complaints:  Diabetes: Well controlled on no medication at last check.  Sees ENDO, A1C was 6.something Using medications without difficulties:on no. Hypoglycemic episodes:None Hyperglycemic episodes:None Feet problems:No ulcers, but burning in feet.. Podiatry Dr Orland Jarred, request B6 and B12 tet. Blood Sugars averaging:FBS 95-154 eye exam within last year:None  Hypertension:  Well controlled on lisinopril, toprol xl, lasix  Using medication without problems or lightheadedness: None Chest pain with exertion:None Edema:None Short of breath:None Average home JYN:WGNF Other issues:Afib on coumadin  Depression, well controlleld on no med.  Review of Systems  Constitutional: Negative for fever and fatigue.  HENT: Negative for ear pain.   Respiratory: Negative for wheezing.   Cardiovascular: Negative for chest pain.  Gastrointestinal: Negative for abdominal pain.  Skin: Negative for rash.       Objective:   Physical Exam  Constitutional: Vital signs are normal. He appears well-developed and well-nourished.  HENT:  Head: Normocephalic.  Right Ear: Hearing normal.  Left Ear: Hearing normal.  Nose: Nose normal.  Mouth/Throat: Oropharynx is clear and moist and mucous membranes are normal.  Neck: Trachea normal. Carotid bruit is not present. No mass and no thyromegaly present.  Cardiovascular: Normal rate and normal pulses.  An irregular rhythm  present. Exam reveals distant heart sounds. Exam reveals no gallop and no friction rub.   No murmur heard.      No peripheral edema  Pulmonary/Chest: Effort normal and breath sounds normal. No respiratory distress.  Skin: Skin is warm, dry and intact. No rash noted.  Psychiatric: He has a normal mood and affect. His speech is normal and behavior is normal. Thought content normal.   Diabetic foot exam: Normal inspection No skin breakdown No calluses  Normal DP pulses Normal sensation to light touch and monofilament Nails normal        Assessment & Plan:

## 2011-12-11 NOTE — Patient Instructions (Addendum)
Work on low cholesterol diet. Continue exercise. Stop by lab on way out. Follow up with fasting labs prior in 3 months.

## 2011-12-11 NOTE — Assessment & Plan Note (Signed)
Eval with B12 . Send results to Dr. Orland Jarred.

## 2011-12-12 ENCOUNTER — Other Ambulatory Visit: Payer: Self-pay | Admitting: Family Medicine

## 2011-12-14 ENCOUNTER — Other Ambulatory Visit: Payer: Self-pay

## 2011-12-14 NOTE — Telephone Encounter (Signed)
Spoke with pharmacist; Medication phoned to [facility] pharmacy as instructed.

## 2011-12-14 NOTE — Telephone Encounter (Signed)
Pt request refill Ambien to [facility]. Medication phoned to [facility] pharmacy as instructed from 12/08/11 refill request. Patient notified as instructed by telephone.

## 2011-12-14 NOTE — Telephone Encounter (Signed)
Refill called in to pharmacy

## 2011-12-21 DIAGNOSIS — T1510XA Foreign body in conjunctival sac, unspecified eye, initial encounter: Secondary | ICD-10-CM | POA: Diagnosis not present

## 2011-12-22 DIAGNOSIS — Q6689 Other  specified congenital deformities of feet: Secondary | ICD-10-CM | POA: Diagnosis not present

## 2011-12-22 DIAGNOSIS — M659 Synovitis and tenosynovitis, unspecified: Secondary | ICD-10-CM | POA: Diagnosis not present

## 2011-12-22 DIAGNOSIS — M79609 Pain in unspecified limb: Secondary | ICD-10-CM | POA: Diagnosis not present

## 2012-01-01 ENCOUNTER — Other Ambulatory Visit: Payer: Self-pay | Admitting: Family Medicine

## 2012-01-01 ENCOUNTER — Ambulatory Visit: Payer: Medicare Other

## 2012-01-01 DIAGNOSIS — B079 Viral wart, unspecified: Secondary | ICD-10-CM | POA: Diagnosis not present

## 2012-01-01 DIAGNOSIS — L570 Actinic keratosis: Secondary | ICD-10-CM | POA: Diagnosis not present

## 2012-01-01 DIAGNOSIS — L821 Other seborrheic keratosis: Secondary | ICD-10-CM | POA: Diagnosis not present

## 2012-01-02 ENCOUNTER — Other Ambulatory Visit: Payer: Self-pay | Admitting: Family Medicine

## 2012-01-04 ENCOUNTER — Encounter: Payer: Self-pay | Admitting: *Deleted

## 2012-01-04 NOTE — Telephone Encounter (Signed)
rx called to pharmacy 

## 2012-01-04 NOTE — Telephone Encounter (Signed)
Error

## 2012-01-05 ENCOUNTER — Ambulatory Visit: Payer: Self-pay

## 2012-01-05 DIAGNOSIS — Q6689 Other  specified congenital deformities of feet: Secondary | ICD-10-CM | POA: Diagnosis not present

## 2012-01-05 DIAGNOSIS — E1142 Type 2 diabetes mellitus with diabetic polyneuropathy: Secondary | ICD-10-CM | POA: Diagnosis not present

## 2012-01-07 ENCOUNTER — Other Ambulatory Visit: Payer: Self-pay | Admitting: Family Medicine

## 2012-01-14 ENCOUNTER — Ambulatory Visit: Payer: Medicare Other

## 2012-01-19 ENCOUNTER — Other Ambulatory Visit: Payer: Self-pay | Admitting: Family Medicine

## 2012-01-20 ENCOUNTER — Ambulatory Visit (INDEPENDENT_AMBULATORY_CARE_PROVIDER_SITE_OTHER): Payer: Medicare Other

## 2012-01-20 DIAGNOSIS — Z5181 Encounter for therapeutic drug level monitoring: Secondary | ICD-10-CM

## 2012-01-20 DIAGNOSIS — I824Y9 Acute embolism and thrombosis of unspecified deep veins of unspecified proximal lower extremity: Secondary | ICD-10-CM | POA: Diagnosis not present

## 2012-01-20 DIAGNOSIS — I4891 Unspecified atrial fibrillation: Secondary | ICD-10-CM | POA: Diagnosis not present

## 2012-01-20 DIAGNOSIS — Z7901 Long term (current) use of anticoagulants: Secondary | ICD-10-CM | POA: Diagnosis not present

## 2012-01-20 LAB — POCT INR: INR: 4

## 2012-01-26 DIAGNOSIS — Q6689 Other  specified congenital deformities of feet: Secondary | ICD-10-CM | POA: Diagnosis not present

## 2012-01-26 DIAGNOSIS — E1142 Type 2 diabetes mellitus with diabetic polyneuropathy: Secondary | ICD-10-CM | POA: Diagnosis not present

## 2012-02-05 ENCOUNTER — Encounter: Payer: Self-pay | Admitting: *Deleted

## 2012-02-05 ENCOUNTER — Encounter: Payer: Medicare Other | Attending: Surgery | Admitting: *Deleted

## 2012-02-05 VITALS — Ht 75.0 in | Wt 226.5 lb

## 2012-02-05 DIAGNOSIS — Z09 Encounter for follow-up examination after completed treatment for conditions other than malignant neoplasm: Secondary | ICD-10-CM | POA: Insufficient documentation

## 2012-02-05 DIAGNOSIS — Z713 Dietary counseling and surveillance: Secondary | ICD-10-CM | POA: Insufficient documentation

## 2012-02-05 DIAGNOSIS — E669 Obesity, unspecified: Secondary | ICD-10-CM

## 2012-02-05 DIAGNOSIS — Z9884 Bariatric surgery status: Secondary | ICD-10-CM | POA: Insufficient documentation

## 2012-02-05 NOTE — Progress Notes (Signed)
  Follow-up visit:  6 Months Post-Operative RYGB Surgery  Medical Nutrition Therapy:  Appt start time: 0800  End time:  0830.  Primary concerns today: Post-operative Bariatric Surgery Nutrition Management. [name] returns today for f/u with an additional 16.4 lb wt loss.  Reports he often has a "feeling of nausea" that rises to the back of his throat and subsequent vomiting after eating some foods. Food recall shows some high fat intake around times of vomiting including biscuits, milkshake, etc. Continues to tolerate foods one day, then not the next. States he discussed with surgeon who believes it will resolve as he gets further post-op. Drinks protein shakes (48g protein each) on these days, though reports nausea with one the other day.  Recently started Metanx d/t B12 deficiency and tingling/burning in feet. Reports sx have resolved.   Surgery date: 08/11/11 Surgery type: RYGB Start weight @ [facility]: 307.1 lbs Weight goal: ~215-220 lbs; main goal is to reverse diabetes  Weight today: 226.5 lbs Weight change: 16.4 lbs Total weight lost: 80.6 lbs BMI: 28.3 kg/m^2  CGM: Most days FBG: 120-130 mg/dL Q4O: 9.6% (>2.9% pre-op) - no new A1c at this time  24-hr recall: B (AM):  Biscuit w/ egg, canadian bacon - vomited d/t egg Snk (AM): None L (PM): Few bites of country ham, biscuits, gravy, dressing - vomited Snk (PM): None D (5 PM): Milkshake (6 oz) - vomited Snk (PM): None  Fluid intake: Water, crystal light, protein shakes, carbonated diet drink mixed with water: ~ 64 oz Estimated total protein intake: ~ 60-70g  Medications:  MD added Metanx d/t pt's sx of burning in hands and feet. Supplementation: See above. Does not take others d/t being "allergic" to them. Broke out in a rash as a child and again several years ago.  Using straws: No Drinking while eating: Sips only; helps food go down Hair loss: No Carbonated beverages:  A few; ~ 8 oz helps settle stomach when food doesn't go down  well N/V/D/C: Regurgitation continues w/ many foods; Avg 2-3 times/week. Many instances of a food not tolerated one day, then fine the next. Cannot tolerate "grease" first thing in the morning. Cannot tolerate eggs, hotdogs, lean hamburger, baked potatoes, sausage, salads.  No reflux reported. Dumping syndrome: None reported  Recent physical activity:  Walks on treadmill for 0.5-0.75 miles daily  Progress Towards Goal(s):  In progress.   Nutritional Diagnosis:  NI-5.7.1 Inadequate protein intake related to recent RYGB surgery as evidenced by patient consuming <50% of recommended goal. [name]-1.4 Altered GI function related to recent RYGB surgery as evidenced by patient reported vomiting after eating many foods.  Intervention:  Nutrition education.  Monitoring/Evaluation:  Dietary intake, exercise, and body weight. Follow up in 3 months for 9 month post-op visit.

## 2012-02-05 NOTE — Patient Instructions (Addendum)
Goals:  Follow Phase 3B: High Protein + Non-Starchy Vegetables  Eat 3-6 small meals/snacks, every 3-5 hrs - Avoid meal skipping  Increase lean protein foods to meet 80g goal  If protein is not tolerated, try to get a protein shake in  Avoid high fat foods to help alleviate nausea  Avoid drinking 15 minutes before, during and 30 minutes after eating  Track food intake for 1 week and notate when you have vomiting; Bring to next visit.

## 2012-02-10 HISTORY — PX: SHOULDER SURGERY: SHX246

## 2012-02-17 ENCOUNTER — Ambulatory Visit (INDEPENDENT_AMBULATORY_CARE_PROVIDER_SITE_OTHER): Payer: Medicare Other

## 2012-02-17 DIAGNOSIS — I824Y9 Acute embolism and thrombosis of unspecified deep veins of unspecified proximal lower extremity: Secondary | ICD-10-CM | POA: Diagnosis not present

## 2012-02-17 DIAGNOSIS — I4891 Unspecified atrial fibrillation: Secondary | ICD-10-CM

## 2012-02-22 ENCOUNTER — Other Ambulatory Visit: Payer: Self-pay | Admitting: Family Medicine

## 2012-02-23 NOTE — Telephone Encounter (Signed)
rx called to pharmacy 

## 2012-03-07 ENCOUNTER — Ambulatory Visit: Payer: Medicare Other

## 2012-03-07 ENCOUNTER — Other Ambulatory Visit (INDEPENDENT_AMBULATORY_CARE_PROVIDER_SITE_OTHER): Payer: Medicare Other

## 2012-03-07 DIAGNOSIS — D518 Other vitamin B12 deficiency anemias: Secondary | ICD-10-CM

## 2012-03-07 DIAGNOSIS — I10 Essential (primary) hypertension: Secondary | ICD-10-CM | POA: Diagnosis not present

## 2012-03-07 DIAGNOSIS — E119 Type 2 diabetes mellitus without complications: Secondary | ICD-10-CM

## 2012-03-07 DIAGNOSIS — E7800 Pure hypercholesterolemia, unspecified: Secondary | ICD-10-CM

## 2012-03-07 DIAGNOSIS — D519 Vitamin B12 deficiency anemia, unspecified: Secondary | ICD-10-CM

## 2012-03-07 DIAGNOSIS — M818 Other osteoporosis without current pathological fracture: Secondary | ICD-10-CM

## 2012-03-07 DIAGNOSIS — D649 Anemia, unspecified: Secondary | ICD-10-CM | POA: Diagnosis not present

## 2012-03-07 DIAGNOSIS — Z125 Encounter for screening for malignant neoplasm of prostate: Secondary | ICD-10-CM

## 2012-03-07 LAB — CBC WITH DIFFERENTIAL/PLATELET
Basophils Absolute: 0.1 10*3/uL (ref 0.0–0.1)
Basophils Relative: 0.8 % (ref 0.0–3.0)
Eosinophils Absolute: 0.1 10*3/uL (ref 0.0–0.7)
HCT: 43.9 % (ref 39.0–52.0)
Hemoglobin: 14.8 g/dL (ref 13.0–17.0)
Lymphs Abs: 2 10*3/uL (ref 0.7–4.0)
MCHC: 33.6 g/dL (ref 30.0–36.0)
MCV: 94.4 fl (ref 78.0–100.0)
Monocytes Absolute: 0.6 10*3/uL (ref 0.1–1.0)
Neutro Abs: 4.8 10*3/uL (ref 1.4–7.7)
RBC: 4.65 Mil/uL (ref 4.22–5.81)
RDW: 14.4 % (ref 11.5–14.6)

## 2012-03-07 LAB — HEMOGLOBIN A1C: Hgb A1c MFr Bld: 8.3 % — ABNORMAL HIGH (ref 4.6–6.5)

## 2012-03-07 LAB — BASIC METABOLIC PANEL
CO2: 22 mEq/L (ref 19–32)
Calcium: 9.3 mg/dL (ref 8.4–10.5)
Chloride: 104 mEq/L (ref 96–112)
Glucose, Bld: 195 mg/dL — ABNORMAL HIGH (ref 70–99)
Sodium: 137 mEq/L (ref 135–145)

## 2012-03-07 LAB — LIPID PANEL
Cholesterol: 227 mg/dL — ABNORMAL HIGH (ref 0–200)
Total CHOL/HDL Ratio: 6
Triglycerides: 205 mg/dL — ABNORMAL HIGH (ref 0.0–149.0)
VLDL: 41 mg/dL — ABNORMAL HIGH (ref 0.0–40.0)

## 2012-03-07 LAB — HEPATIC FUNCTION PANEL
AST: 22 U/L (ref 0–37)
Albumin: 4.3 g/dL (ref 3.5–5.2)
Total Bilirubin: 0.9 mg/dL (ref 0.3–1.2)

## 2012-03-07 NOTE — Addendum Note (Signed)
Addended by: Alvina Chou on: 03/07/2012 03:34 PM   Modules accepted: Orders

## 2012-03-09 ENCOUNTER — Telehealth: Payer: Self-pay | Admitting: Family Medicine

## 2012-03-09 ENCOUNTER — Encounter: Payer: Self-pay | Admitting: Family Medicine

## 2012-03-09 ENCOUNTER — Ambulatory Visit (INDEPENDENT_AMBULATORY_CARE_PROVIDER_SITE_OTHER): Payer: Medicare Other | Admitting: Family Medicine

## 2012-03-09 VITALS — BP 118/74 | HR 82 | Temp 97.4°F | Ht 75.0 in | Wt 224.5 lb

## 2012-03-09 DIAGNOSIS — K5792 Diverticulitis of intestine, part unspecified, without perforation or abscess without bleeding: Secondary | ICD-10-CM

## 2012-03-09 DIAGNOSIS — K5732 Diverticulitis of large intestine without perforation or abscess without bleeding: Secondary | ICD-10-CM | POA: Diagnosis not present

## 2012-03-09 MED ORDER — CIPROFLOXACIN HCL 750 MG PO TABS: 750.0000 mg | ORAL_TABLET | Freq: Two times a day (BID) | ORAL | Status: DC

## 2012-03-09 MED ORDER — METRONIDAZOLE 500 MG PO TABS: 500.0000 mg | ORAL_TABLET | Freq: Three times a day (TID) | ORAL | Status: DC

## 2012-03-09 NOTE — Telephone Encounter (Signed)
Patient needs to be seen in the office today - I can work him in. Ideally, let's have him come now

## 2012-03-09 NOTE — Telephone Encounter (Signed)
Patient coming in now.

## 2012-03-09 NOTE — Patient Instructions (Addendum)
F/u [facility] in [place] next wednesday

## 2012-03-09 NOTE — Telephone Encounter (Signed)
Patient Information:  Caller Name: Erie Noe  Phone: [phone]  Patient: [name], [name]  Gender: Male  DOB: 14-Jul-1940  Age: 72 Years  PCP: [name] (Family Practice)  Office Follow Up:  Does the office need to follow up with this patient?: Yes  Instructions For The Office: ED disposition or see now with PCP approval for constant abdominal pain > 2 hours.  History of diverticulitis.  Needs at least 45-60 minutes to reach office.  Please call back ASAP to advise if can be seen in office.  RN Note:  No vomiting or diarrhea. Subumbilical pain present rated 5/10. Last BM 03/09/12 at 0300.  Dull pain when sitting, sharp pain with movement. Pain began after eating a salad with peanuts. Declined to go to ED; requesting office visit since Dr Ermalene Searing in the past "has always called out something."  Information noted and sent to [facility] for follow up with caller ASAP.  Symptoms  Reason For Call & Symptoms: Called regarding  intermittent "major" abdominal pain. History of diverticulitis.  Reviewed Health History In EMR: Yes  Reviewed Medications In EMR: Yes  Reviewed Allergies In EMR: Yes  Reviewed Surgeries / Procedures: Yes  Date of Onset of Symptoms: 03/08/2012  Treatments Tried: clear liquids and light diet  Treatments Tried Worked: No  Guideline(s) Used:  Abdominal Pain - Male  Disposition Per Guideline:   Go to ED Now (or to Office with PCP Approval)  Reason For Disposition Reached:   Constant abdominal pain lasting > 2 hours  Advice Given:  Rest:  Lie down and rest until you feel better.  Fluids:  Sip clear fluids only (e.g., water, flat soft drinks or half-strength fruit juice) until the pain has been gone for over 2 hours. Then slowly return to a regular diet.  Avoid NSAIDS and Aspirin  : Avoid any drug that can irritate the stomach lining and make the pain worse (especially aspirin and NSAIDs like ibuprofen).  Expected Course:  With harmless causes, the pain is  usually better or goes away within 2 hours. With viral gastroenteritis ("stomach flu"), belly cramps may precede each bout of vomiting or diarrhea and may last 2-3 days. With serious causes (such as appendicitis) the pain becomes constant and more severe.  Patient Refused Recommendation:  Patient Will Follow Up With Office Later  Requesting to be seen in office.

## 2012-03-09 NOTE — Progress Notes (Signed)
Nature conservation officer at [facility] [address] [place] [address] Phone: [phone] Fax: [phone]  Date:  03/09/2012   Name:  [name]   DOB:  1940/03/12   MRN:  [id number] Gender: male Age: 72 y.o.  Primary Physician:  [name], MD  Evaluating MD: Hannah Beat, MD   Chief Complaint: ? diverticulitis   History of Present Illness:  [name] is a 72 y.o. pleasant patient who presents with the following:  Pleasant male with a history of diverticulitis x 3 in the past presents with acute abd pain. Went to a friends house and had some peanuts in it --- and thinks may have flared up his diverticulitis.  No fever, chills, blood in stool, no nausea, vomitting, diarrhea.   No chest pain or SOB.  Patient Active Problem List  Diagnosis  . HERPES ZOSTER W/NERVOUS COMPLICATION NEC  . DM  . HYPERCHOLESTEROLEMIA  . UNSPECIFIED ANEMIA  . ANXIETY  . DEPRESSION  . RESTLESS LEG SYNDROME  . HYPERTENSION  . ATRIAL FIBRILLATION, PAROXYSMAL  . ALLERGIC RHINITIS CAUSE UNSPECIFIED  . OTHER DISEASES OF NASAL CAVITY AND SINUSES  . ASTHMA, PERSISTENT, MILD  . GASTROPARESIS  . DIVERTICULOSIS, COLON  . IRRITABLE BOWEL SYNDROME  . RENAL INSUFFICIENCY  . LOW BACK PAIN, CHRONIC  . OSTEOPOROSIS, DRUG-INDUCED  . FOOT SURGERY, HX OF  . KNEE REPLACEMENT, LEFT, HX OF  . Acute venous embolism and thrombosis of deep vessels of proximal lower extremity  . Diverticulitis of colon  . GERD (gastroesophageal reflux disease)  . Nonsurgical dumping syndrome  . Bacterial overgrowth syndrome  . Obesity  . Atrial fibrillation, permanent  . 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 Peripheral neuropathy    Past Medical History  Diagnosis Date  . Diverticulosis of colon (without mention of hemorrhage)   . Other pulmonary embolism and infarction   . Restless legs syndrome (RLS)   . Obesity, unspecified   . Irritable bowel syndrome   . Gastroparesis   . Unspecified  essential hypertension   . Lumbago   . Depressive disorder, not elsewhere classified   . Anxiety state, unspecified   . Unspecified asthma   . Esophageal reflux   . Atrial fibrillation   . Pure hypercholesterolemia   . Type II or unspecified type diabetes mellitus without mention of complication, not stated as uncontrolled   . Hemorrhoids   . Back pain, chronic   . Hx of gout   . Bruises easily   . Cancer     SKIN CANCER REMOVED  . Sleep apnea     STOP BANG SCORE 6    Past Surgical History  Procedure Date  . Back surgery     X 4   . Knee surgery     Bilateral x 6  . Artery repair     Left forearm  . Foot surgery     Left foot   . Replacement total knee     Left knee   . Breath tek h pylori 01/08/2011    Procedure: BREATH TEK H PYLORI;  Surgeon: Valarie Merino, MD;  Location: [facility];  Service: General;  Laterality: N/A;  . Eye surgery   . Hand surgery     LEFT  . Leg surgery     FOR NECROTIZING FASCITIS L LEG AND GROIN  . Gastric roux-en-y 08/11/2011    Procedure: LAPAROSCOPIC ROUX-EN-Y GASTRIC BYPASS WITH UPPER ENDOSCOPY;  Surgeon: Valarie Merino, MD;  Location: [facility];  Service: General;  Laterality: N/A;    History  Substance Use Topics  . Smoking status: Never Smoker   . Smokeless tobacco: Never Used  . Alcohol Use: No    Family History  Problem Relation Age of Onset  . Uterine cancer Mother     mets  . Breast cancer Mother   . Cancer Mother     cervical  . Emphysema Father   . Alzheimer's disease Sister   . Prostate cancer Brother   . Heart attack Brother   . Colon cancer Brother 44    Allergies  Allergen Reactions  . Sulfacetamide Sodium     REACTION: Throat swelling  . Oxycodone Other (See Comments)    CONFUSION / HALLUCINATIONS    Medication list has been reviewed and updated.  Outpatient Prescriptions Prior to Visit  Medication Sig Dispense Refill  . ALREX 0.2 % SUSP       . FLUoxetine (PROZAC) 20 MG tablet TAKE 1 TABLET (20  MG TOTAL) BY MOUTH DAILY.  30 tablet  3  . furosemide (LASIX) 20 MG tablet TAKE 1 TABLET BY MOUTH DAILY AS NEEDED PERIPHERAL SWELLING  30 tablet  6  . HYDROcodone-acetaminophen (NORCO/VICODIN) 5-325 MG per tablet       . L-Methylfolate-B6-B12 (METANX PO) Take 1 capsule by mouth 2 (two) times daily.      [name] lisinopril (PRINIVIL,ZESTRIL) 2.5 MG tablet Take 2.5 mg by mouth daily.      . meloxicam (MOBIC) 15 MG tablet       . metoprolol succinate (TOPROL-XL) 25 MG 24 hr tablet Take 1 tablet (25 mg total) by mouth 2 (two) times daily.  180 tablet  3  . omeprazole (PRILOSEC) 40 MG capsule Take 1 capsule (40 mg total) by mouth 2 (two) times daily.  180 capsule  3  . RAPAFLO 8 MG CAPS capsule       . topiramate (TOPAMAX) 25 MG tablet       . triamcinolone cream (KENALOG) 0.1 %       . warfarin (COUMADIN) 2.5 MG tablet Take 1 tablet (2.5 mg total) by mouth daily. As directed  90 tablet  3  . zolpidem (AMBIEN) 5 MG tablet TAKE 1-2 TABLETS BY MOUTH AT BEDTIME AS NEEDED  30 tablet  0   Last reviewed on 03/09/2012 12:24 PM by Consuello Masse, CMA  Review of Systems:  ROS: GEN: Acute illness details above GI: Tolerating PO intake GU: maintaining adequate hydration and urination Pulm: No SOB Interactive and getting along well at home.  Otherwise, ROS is as per the HPI.   Physical Examination: BP 118/74  Pulse 82  Temp 97.4 F (36.3 C) (Oral)  Ht 6\' 3"  (1.905 m)  Wt 224 lb 8 oz (101.833 kg)  BMI 28.06 kg/m2  SpO2 98%  Ideal Body Weight: Weight in (lb) to have BMI = 25: 199.6   GEN: WDWN, NAD, Non-toxic, A & O x 3 HEENT: Atraumatic, Normocephalic. Neck supple. No masses, No LAD. Ears and Nose: No external deformity. CV: irreg, irreg PULM: CTA B, no wheezes, crackles, rhonchi. No retractions. No resp. distress. No accessory muscle use. ABD: R and L LQ pain > upper quadrants.  No rebound. No HSM. EXTR: No c/c/e NEURO Normal gait.  PSYCH: Normally interactive. Conversant. Not depressed or  anxious appearing.  Calm demeanor.   Assessment and Plan: 1. Acute diverticulitis     Acute diverticulitis clinically Treat as such Precautions if worsens  Meds ordered this encounter  Medications  . ciprofloxacin (CIPRO) 750 MG tablet    Sig: Take 1 tablet (750 mg total) by mouth 2 (two) times daily.    Dispense:  20 tablet    Refill:  0  . metroNIDAZOLE (FLAGYL) 500 MG tablet    Sig: Take 1 tablet (500 mg total) by mouth 3 (three) times daily.    Dispense:  30 tablet    Refill:  0    Signed, Andrw Mcguirt T. Jacson Rapaport, MD 03/09/2012 1:17 PM

## 2012-03-15 ENCOUNTER — Encounter: Payer: Self-pay | Admitting: Family Medicine

## 2012-03-15 ENCOUNTER — Ambulatory Visit (INDEPENDENT_AMBULATORY_CARE_PROVIDER_SITE_OTHER): Payer: Medicare Other | Admitting: Family Medicine

## 2012-03-15 VITALS — BP 120/64 | HR 96 | Temp 97.9°F | Ht 75.0 in | Wt 222.0 lb

## 2012-03-15 DIAGNOSIS — E119 Type 2 diabetes mellitus without complications: Secondary | ICD-10-CM

## 2012-03-15 DIAGNOSIS — E7800 Pure hypercholesterolemia, unspecified: Secondary | ICD-10-CM

## 2012-03-15 DIAGNOSIS — I10 Essential (primary) hypertension: Secondary | ICD-10-CM

## 2012-03-15 MED ORDER — ATORVASTATIN CALCIUM 20 MG PO TABS: 20.0000 mg | ORAL_TABLET | Freq: Every day | ORAL | Status: DC

## 2012-03-15 NOTE — Assessment & Plan Note (Signed)
At goal on current regimen. 

## 2012-03-15 NOTE — Assessment & Plan Note (Signed)
Not at goal.. Restart atorvastatin but at lower dose. Recheck in 3 months.

## 2012-03-15 NOTE — Progress Notes (Signed)
72 year old male with 70-80 lb weight loss since gastric bypass earlier in the year.    Had diverticulitis flare in last week.. Almost done with antibiotics.  Elevated Cholesterol: LDL not at goal off medicaiton. Pt was not interested at last OV on restarting.. Trying lifestly change but it is obvious that this has made no change. Lab Results  Component Value Date   CHOL 227* 03/07/2012   HDL 36.50* 03/07/2012   LDLDIRECT 147.0 03/07/2012   TRIG 205.0* 03/07/2012   CHOLHDL 6 03/07/2012  Previously on lipitor 80 mg daily. Has been on hold since surgery.   Today he is okay with restarting at low dose. Diet compliance:Great  Exercise: Daily treamill 20-30 min  Other complaints:   Weighed 217 .4 at home. Wt Readings from Last 3 Encounters:  03/15/12 222 lb (100.699 kg)  03/09/12 224 lb 8 oz (101.833 kg)  02/05/12 226 lb 8 oz (102.74 kg)     Diabetes: Recent poor  control on no medication at last check. Had been drinking a lot of juice Sees ENDO, A1C was 8.3 Using medications without difficulties:on no.  Hypoglycemic episodes:None  Hyperglycemic episodes:None  Feet problems:No ulcers, but burning in feet.. Podiatry Dr Orland Jarred Blood Sugars averaging:FBS 134 eye exam within last year:None   Hypertension: Well controlled on lisinopril, toprol xl, lasix  Using medication without problems or lightheadedness: None  Chest pain with exertion:None  Edema:None  Short of breath:None  Average home ZOX:WRUE  Other issues:Afib on coumadin   Depression, well controlleld on no med.   Review of Systems  Constitutional: Negative for fever and fatigue.  HENT: Negative for ear pain.  Respiratory: Negative for wheezing.  Cardiovascular: Negative for chest pain.  Gastrointestinal: Negative for abdominal pain.  Skin: Negative for rash.    Objective:   Physical Exam  Constitutional: Vital signs are normal. He appears well-developed and well-nourished.  HENT:  Head: Normocephalic.  Right Ear:  Hearing normal.  Left Ear: Hearing normal.  Nose: Nose normal.  Mouth/Throat: Oropharynx is clear and moist and mucous membranes are normal.  Neck: Trachea normal. Carotid bruit is not present. No mass and no thyromegaly present.  Cardiovascular: Normal rate and normal pulses. An irregular rhythm present. Exam reveals distant heart sounds. Exam reveals no gallop and no friction rub.  No murmur heard. No peripheral edema  Pulmonary/Chest: Effort normal and breath sounds normal. No respiratory distress.  Skin: Skin is warm, dry and intact. No rash noted.  Psychiatric: He has a normal mood and affect. His speech is normal and behavior is normal. Thought content normal.   Diabetic foot exam: per endo

## 2012-03-15 NOTE — Patient Instructions (Addendum)
Get back on track with diet.. Stop juice. Restart atorvastatin but at lower dose , just 20 mg daily. Return in 3 months for fasting labs to recheck cholesterol in 3 months.

## 2012-03-15 NOTE — Assessment & Plan Note (Signed)
Followed by ENDo.[name] Poor control. Get back on track with diet.

## 2012-03-16 ENCOUNTER — Ambulatory Visit (INDEPENDENT_AMBULATORY_CARE_PROVIDER_SITE_OTHER): Payer: Medicare Other

## 2012-03-16 ENCOUNTER — Other Ambulatory Visit: Payer: Medicare Other

## 2012-03-16 DIAGNOSIS — I824Y9 Acute embolism and thrombosis of unspecified deep veins of unspecified proximal lower extremity: Secondary | ICD-10-CM

## 2012-03-16 DIAGNOSIS — I4891 Unspecified atrial fibrillation: Secondary | ICD-10-CM

## 2012-03-16 DIAGNOSIS — Z5181 Encounter for therapeutic drug level monitoring: Secondary | ICD-10-CM

## 2012-03-16 DIAGNOSIS — Z7901 Long term (current) use of anticoagulants: Secondary | ICD-10-CM | POA: Diagnosis not present

## 2012-03-16 LAB — POCT INR: INR: 3.8

## 2012-03-28 ENCOUNTER — Other Ambulatory Visit: Payer: Self-pay | Admitting: Family Medicine

## 2012-03-28 ENCOUNTER — Telehealth: Payer: Self-pay | Admitting: *Deleted

## 2012-03-28 NOTE — Telephone Encounter (Signed)
Patient calling says he has some wheezing and coughing has been taken mucinex and nebulizer to treat it but wants to know if you would call in prednisone so it wont get any worse.

## 2012-03-29 ENCOUNTER — Ambulatory Visit (INDEPENDENT_AMBULATORY_CARE_PROVIDER_SITE_OTHER): Payer: Medicare Other | Admitting: Family Medicine

## 2012-03-29 ENCOUNTER — Encounter: Payer: Self-pay | Admitting: Family Medicine

## 2012-03-29 VITALS — BP 120/74 | HR 71 | Temp 98.3°F | Ht 75.0 in | Wt 219.0 lb

## 2012-03-29 DIAGNOSIS — M9981 Other biomechanical lesions of cervical region: Secondary | ICD-10-CM | POA: Diagnosis not present

## 2012-03-29 DIAGNOSIS — E119 Type 2 diabetes mellitus without complications: Secondary | ICD-10-CM | POA: Diagnosis not present

## 2012-03-29 DIAGNOSIS — R609 Edema, unspecified: Secondary | ICD-10-CM | POA: Diagnosis not present

## 2012-03-29 DIAGNOSIS — J45901 Unspecified asthma with (acute) exacerbation: Secondary | ICD-10-CM

## 2012-03-29 DIAGNOSIS — M542 Cervicalgia: Secondary | ICD-10-CM | POA: Diagnosis not present

## 2012-03-29 DIAGNOSIS — J45909 Unspecified asthma, uncomplicated: Secondary | ICD-10-CM

## 2012-03-29 DIAGNOSIS — M546 Pain in thoracic spine: Secondary | ICD-10-CM | POA: Diagnosis not present

## 2012-03-29 DIAGNOSIS — M999 Biomechanical lesion, unspecified: Secondary | ICD-10-CM | POA: Diagnosis not present

## 2012-03-29 MED ORDER — FUROSEMIDE 20 MG PO TABS: 20.0000 mg | ORAL_TABLET | Freq: Every day | ORAL | Status: DC

## 2012-03-29 MED ORDER — GUAIFENESIN-CODEINE 100-10 MG/5ML PO SYRP: 5.0000 mL | ORAL_SOLUTION | Freq: Every evening | ORAL | Status: DC | PRN

## 2012-03-29 MED ORDER — PREDNISONE 20 MG PO TABS: ORAL_TABLET | ORAL | Status: DC

## 2012-03-29 NOTE — Assessment & Plan Note (Addendum)
Okay to use additional lasix. Edema is likely due to venous insufficiency.

## 2012-03-29 NOTE — Assessment & Plan Note (Signed)
Likely viral trigger. No clear sign on bacterial infection. Treat with prednisone taper. Call if not improving as expected.

## 2012-03-29 NOTE — Telephone Encounter (Signed)
Patient advised and made appt for today at 2:30

## 2012-03-29 NOTE — Telephone Encounter (Signed)
refill 

## 2012-03-29 NOTE — Patient Instructions (Addendum)
Continue mucinex during the day, use cough suppressant at night. Hold ambien while on cough suppresant.  Start prednisone taper. Call if not improving as expected.  Can occ try lasix 2 tabs of 20 mg daily. When taking additional lasix , make sure to take a potassium tab.  Follow blood sugar closely on prednisone

## 2012-03-29 NOTE — Telephone Encounter (Signed)
Needs an appointment to be seen  

## 2012-03-29 NOTE — Telephone Encounter (Signed)
rx called to pharmacy 

## 2012-03-29 NOTE — Progress Notes (Signed)
  Subjective:    Patient ID: [name], male    DOB: 12/21/1940, 72 y.o.   MRN: [id number]  HPI Comments: He has also been noting more water retention off and on in last 6 months.. OCc uses lasix 2 tab daily with good results. Not taking additional potassium  Wheezing  This is a new problem. The current episode started in the past 7 days. The problem has been gradually worsening. Associated symptoms include coryza, coughing and shortness of breath. Pertinent negatives include no abdominal pain, chest pain, fever, rash, rhinorrhea, sore throat, sputum production or swollen glands. Associated symptoms comments: Dry cough until today, now yellow phelgm.. The symptoms are aggravated by exercise. He has tried beta agonist inhalers and OTC cough suppressant (nebs 2-3 times a day helping temporarily, mucinex) for the symptoms. The treatment provided mild relief. His past medical history is significant for asthma and chronic lung disease.      Review of Systems  Constitutional: Negative for fever.  HENT: Negative for sore throat and rhinorrhea.   Respiratory: Positive for cough, shortness of breath and wheezing. Negative for sputum production.   Cardiovascular: Negative for chest pain.  Gastrointestinal: Negative for abdominal pain.  Skin: Negative for rash.       Objective:   Physical Exam  Constitutional: Vital signs are normal. He appears well-developed and well-nourished.  Non-toxic appearance. He does not appear ill. No distress.  HENT:  Head: Normocephalic and atraumatic.  Right Ear: Hearing, tympanic membrane, external ear and ear canal normal. No tenderness. No foreign bodies. Tympanic membrane is not retracted and not bulging.  Left Ear: Hearing, tympanic membrane, external ear and ear canal normal. No tenderness. No foreign bodies. Tympanic membrane is not retracted and not bulging.  Nose: Nose normal. No mucosal edema or rhinorrhea. Right sinus exhibits no maxillary sinus  tenderness and no frontal sinus tenderness. Left sinus exhibits no maxillary sinus tenderness and no frontal sinus tenderness.  Mouth/Throat: Uvula is midline, oropharynx is clear and moist and mucous membranes are normal. Normal dentition. No dental caries. No oropharyngeal exudate or tonsillar abscesses.  Eyes: Conjunctivae, EOM and lids are normal. Pupils are equal, round, and reactive to light. No foreign bodies found.  Neck: Trachea normal, normal range of motion and phonation normal. Neck supple. Carotid bruit is not present. No mass and no thyromegaly present.  Cardiovascular: Normal rate, regular rhythm, S1 normal, S2 normal, normal heart sounds, intact distal pulses and normal pulses.  Exam reveals no gallop, no distant heart sounds and no friction rub.   No murmur heard. 1 plus peripheral edema  Pulmonary/Chest: Effort normal. No respiratory distress. He has wheezes. He has no rhonchi. He has no rales.  Abdominal: Soft. Normal appearance and bowel sounds are normal. There is no hepatosplenomegaly. There is no tenderness. There is no rebound, no guarding and no CVA tenderness. No hernia.  Neurological: He is alert. He has normal reflexes.  Skin: Skin is warm, dry and intact. No rash noted.  Psychiatric: He has a normal mood and affect. His speech is normal and behavior is normal. Judgment and thought content normal.          Assessment & Plan:

## 2012-03-29 NOTE — Assessment & Plan Note (Signed)
Follow blood sugar closely on prednisone taper.

## 2012-04-01 ENCOUNTER — Telehealth: Payer: Self-pay | Admitting: *Deleted

## 2012-04-01 MED ORDER — AZITHROMYCIN 250 MG PO TABS: ORAL_TABLET | ORAL | Status: DC

## 2012-04-01 NOTE — Telephone Encounter (Signed)
Find out nmore info.. What is worse? Is his wheezing not better at all on prednisone or now productive cough or fever?

## 2012-04-01 NOTE — Telephone Encounter (Signed)
Will continue prednisone that her is on .[name] Add antibitoics. Call if not improving early next week.  Have him have INR checked 2-3 days after starting antibitoics.

## 2012-04-01 NOTE — Telephone Encounter (Signed)
Wheezing worse and more productive cough

## 2012-04-01 NOTE — Telephone Encounter (Signed)
Patient calling and says that he was told to call if he was not feeling better and he is not. Patient also request refill on cough medicine

## 2012-04-01 NOTE — Telephone Encounter (Signed)
Patient advised.

## 2012-04-05 ENCOUNTER — Ambulatory Visit (INDEPENDENT_AMBULATORY_CARE_PROVIDER_SITE_OTHER): Payer: Medicare Other

## 2012-04-05 DIAGNOSIS — Z7901 Long term (current) use of anticoagulants: Secondary | ICD-10-CM | POA: Diagnosis not present

## 2012-04-05 DIAGNOSIS — Z5181 Encounter for therapeutic drug level monitoring: Secondary | ICD-10-CM

## 2012-04-05 DIAGNOSIS — I824Y9 Acute embolism and thrombosis of unspecified deep veins of unspecified proximal lower extremity: Secondary | ICD-10-CM

## 2012-04-07 ENCOUNTER — Telehealth: Payer: Self-pay | Admitting: Family Medicine

## 2012-04-07 ENCOUNTER — Ambulatory Visit (INDEPENDENT_AMBULATORY_CARE_PROVIDER_SITE_OTHER)
Admission: RE | Admit: 2012-04-07 | Discharge: 2012-04-07 | Disposition: A | Discharge status: Home / Self Care | Payer: Medicare Other | Source: Ambulatory Visit | Attending: Family Medicine | Admitting: Family Medicine

## 2012-04-07 ENCOUNTER — Ambulatory Visit (INDEPENDENT_AMBULATORY_CARE_PROVIDER_SITE_OTHER): Payer: Medicare Other | Admitting: Family Medicine

## 2012-04-07 ENCOUNTER — Encounter: Payer: Self-pay | Admitting: Family Medicine

## 2012-04-07 VITALS — BP 120/70 | HR 73 | Temp 97.8°F | Ht 75.0 in | Wt 218.0 lb

## 2012-04-07 DIAGNOSIS — J45901 Unspecified asthma with (acute) exacerbation: Secondary | ICD-10-CM

## 2012-04-07 MED ORDER — PREDNISONE 20 MG PO TABS: ORAL_TABLET | ORAL | Status: DC

## 2012-04-07 MED ORDER — FLUTICASONE-SALMETEROL 250-50 MCG/DOSE IN AEPB: 1.0000 | INHALATION_SPRAY | Freq: Two times a day (BID) | RESPIRATORY_TRACT | Status: DC

## 2012-04-07 NOTE — Patient Instructions (Addendum)
We will call you with a plan after CXR returns.

## 2012-04-07 NOTE — Progress Notes (Signed)
  Subjective:    Patient ID: [name], male    DOB: [birth year], 72 y.o.   MRN: [id number]  HPI  72 year old male with history of COPD, recently seen on 2/18 Dx with COPD exac.. Treated with azithro and prednisone taper, returns to clinic today with continued symptoms. Coughing up white/ yellow sputum, less thick and heavy as before.  He reports some slight improvement but still runny nose, cough, wheeze, SOB. Using nebulizer prn. Not on any controller med.  He tried controllers in past but wasn't sure they helped.  Blood sugars have been high on prednisone. INR adjusted by [facility].     Review of Systems  Constitutional: Positive for fatigue. Negative for fever.  HENT: Positive for congestion. Negative for ear pain, rhinorrhea and postnasal drip.   Eyes: Negative for pain.  Respiratory: Negative for shortness of breath.   Cardiovascular: Negative for chest pain, palpitations and leg swelling.  Gastrointestinal: Negative for abdominal distention.       Objective:   Physical Exam  Constitutional: Vital signs are normal. He appears well-developed and well-nourished.  Non-toxic appearance. He does not appear ill. No distress.  HENT:  Head: Normocephalic and atraumatic.  Right Ear: Hearing, tympanic membrane, external ear and ear canal normal. No tenderness. No foreign bodies. Tympanic membrane is not retracted and not bulging.  Left Ear: Hearing, tympanic membrane, external ear and ear canal normal. No tenderness. No foreign bodies. Tympanic membrane is not retracted and not bulging.  Nose: Nose normal. No mucosal edema or rhinorrhea. Right sinus exhibits no maxillary sinus tenderness and no frontal sinus tenderness. Left sinus exhibits no maxillary sinus tenderness and no frontal sinus tenderness.  Mouth/Throat: Uvula is midline, oropharynx is clear and moist and mucous membranes are normal. Normal dentition. No dental caries. No oropharyngeal exudate or  tonsillar abscesses.  Eyes: Conjunctivae, EOM and lids are normal. Pupils are equal, round, and reactive to light. No foreign bodies found.  Neck: Trachea normal, normal range of motion and phonation normal. Neck supple. Carotid bruit is not present. No mass and no thyromegaly present.  Cardiovascular: Normal rate, regular rhythm, S1 normal, S2 normal, normal heart sounds, intact distal pulses and normal pulses.  Exam reveals no gallop.   No murmur heard. Pulmonary/Chest: Effort normal. No respiratory distress. He has decreased breath sounds. He has no wheezes. He has rhonchi. He has no rales.  Abdominal: Soft. Normal appearance and bowel sounds are normal. There is no hepatosplenomegaly. There is no tenderness. There is no rebound, no guarding and no CVA tenderness. No hernia.  Neurological: He is alert. He has normal reflexes.  Skin: Skin is warm, dry and intact. No rash noted.  Psychiatric: He has a normal mood and affect. His speech is normal and behavior is normal. Judgment normal.          Assessment & Plan:

## 2012-04-07 NOTE — Telephone Encounter (Signed)
Notify pt that no pneumonia on CXR. We will repeat prednisone taper lower dose but for longer course. Have him start Advair 250/50 mg BID.  Stay on advair longterm for prevention as well.

## 2012-04-07 NOTE — Assessment & Plan Note (Signed)
Given persistent cough and new rhonchi will eval with CXR to rule out pneumonia and to determine if need for second course of antibiotics. Likely will have him start advair controller as well as repeat a prolongued course of prednisone.

## 2012-05-03 DIAGNOSIS — E1149 Type 2 diabetes mellitus with other diabetic neurological complication: Secondary | ICD-10-CM | POA: Diagnosis not present

## 2012-05-03 DIAGNOSIS — E1142 Type 2 diabetes mellitus with diabetic polyneuropathy: Secondary | ICD-10-CM | POA: Diagnosis not present

## 2012-05-03 DIAGNOSIS — E669 Obesity, unspecified: Secondary | ICD-10-CM | POA: Diagnosis not present

## 2012-05-03 DIAGNOSIS — I10 Essential (primary) hypertension: Secondary | ICD-10-CM | POA: Diagnosis not present

## 2012-05-03 DIAGNOSIS — E785 Hyperlipidemia, unspecified: Secondary | ICD-10-CM | POA: Diagnosis not present

## 2012-05-04 ENCOUNTER — Ambulatory Visit (INDEPENDENT_AMBULATORY_CARE_PROVIDER_SITE_OTHER): Payer: Medicare Other

## 2012-05-04 DIAGNOSIS — I4891 Unspecified atrial fibrillation: Secondary | ICD-10-CM | POA: Diagnosis not present

## 2012-05-04 DIAGNOSIS — Z5181 Encounter for therapeutic drug level monitoring: Secondary | ICD-10-CM

## 2012-05-04 DIAGNOSIS — Z7901 Long term (current) use of anticoagulants: Secondary | ICD-10-CM

## 2012-05-04 DIAGNOSIS — I824Y9 Acute embolism and thrombosis of unspecified deep veins of unspecified proximal lower extremity: Secondary | ICD-10-CM

## 2012-05-05 ENCOUNTER — Encounter (INDEPENDENT_AMBULATORY_CARE_PROVIDER_SITE_OTHER): Payer: Self-pay | Admitting: Surgery

## 2012-05-05 ENCOUNTER — Ambulatory Visit (INDEPENDENT_AMBULATORY_CARE_PROVIDER_SITE_OTHER): Payer: Medicare Other | Admitting: Surgery

## 2012-05-05 VITALS — BP 125/83 | HR 70 | Temp 98.3°F | Resp 14 | Ht 75.0 in | Wt 225.2 lb

## 2012-05-05 DIAGNOSIS — M9981 Other biomechanical lesions of cervical region: Secondary | ICD-10-CM | POA: Diagnosis not present

## 2012-05-05 DIAGNOSIS — M542 Cervicalgia: Secondary | ICD-10-CM | POA: Diagnosis not present

## 2012-05-05 DIAGNOSIS — Z9884 Bariatric surgery status: Secondary | ICD-10-CM

## 2012-05-05 NOTE — Progress Notes (Signed)
[name] 72 y.o.  Body mass index is 28.15 kg/(m^2).  Patient Active Problem List  Diagnosis  . HERPES ZOSTER W/NERVOUS COMPLICATION NEC  . DM  . HYPERCHOLESTEROLEMIA  . UNSPECIFIED ANEMIA  . ANXIETY  . DEPRESSION  . RESTLESS LEG SYNDROME  . HYPERTENSION  . ATRIAL FIBRILLATION, PAROXYSMAL  . ALLERGIC RHINITIS CAUSE UNSPECIFIED  . OTHER DISEASES OF NASAL CAVITY AND SINUSES  . ASTHMA, PERSISTENT, MILD  . GASTROPARESIS  . DIVERTICULOSIS, COLON  . IRRITABLE BOWEL SYNDROME  . RENAL INSUFFICIENCY  . LOW BACK PAIN, CHRONIC  . OSTEOPOROSIS, DRUG-INDUCED  . FOOT SURGERY, HX OF  . KNEE REPLACEMENT, LEFT, HX OF  . Acute venous embolism and thrombosis of deep vessels of proximal lower extremity  . Diverticulitis of colon  . GERD (gastroesophageal reflux disease)  . Nonsurgical dumping syndrome  . Bacterial overgrowth syndrome  . Obesity  . Atrial fibrillation, permanent  . 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 Peripheral neuropathy  . Peripheral edema  . Asthma exacerbation    Allergies  Allergen Reactions  . Sulfacetamide Sodium     REACTION: Throat swelling  . Oxycodone Other (See Comments)    CONFUSION / HALLUCINATIONS    Past Surgical History  Procedure Laterality Date  . Back surgery      X 4   . Knee surgery      Bilateral x 6  . Artery repair      Left forearm  . Foot surgery      Left foot   . Replacement total knee      Left knee   . Breath tek h pylori  01/08/2011    Procedure: BREATH TEK H PYLORI;  Surgeon: Valarie Merino, MD;  Location: [facility];  Service: General;  Laterality: N/A;  . Eye surgery    . Hand surgery      LEFT  . Leg surgery      FOR NECROTIZING FASCITIS L LEG AND GROIN  . Gastric roux-en-y  08/11/2011    Procedure: LAPAROSCOPIC ROUX-EN-Y GASTRIC BYPASS WITH UPPER ENDOSCOPY;  Surgeon: Valarie Merino, MD;  Location: [facility];  Service: General;  Laterality: N/A;   Jonathan Nora, MD No diagnosis found.  9 months out from  surgery.  He had a bout of bronchitis this year requiring steroids which affected his diabetes. Otherwise he's been off insulin. He's got a some problems underneath his pannus with some early breakdown. We will keep it on this. When I see him back in July we'll check this. At that point he will be one year out. I think he is pretty much down to his target weight having lost about 90 pounds.  Recheck in July at one year postop. Will look at pannus for evidence of panniculitis. Jonathan B. Daphine Deutscher, MD, [facility] [phone] beeper [phone]  05/05/2012 9:53 AM

## 2012-05-05 NOTE — Patient Instructions (Signed)
Thanks for your patience.  If you need further assistance after leaving the office, please call our office and speak with a [facility] nurse.  [phone].  If you want to leave a message for Dr. Bufford Helms, please call his office phone at [phone]. 

## 2012-05-06 ENCOUNTER — Encounter: Payer: Self-pay | Admitting: *Deleted

## 2012-05-06 ENCOUNTER — Encounter: Payer: Self-pay | Admitting: Family Medicine

## 2012-05-06 ENCOUNTER — Ambulatory Visit (INDEPENDENT_AMBULATORY_CARE_PROVIDER_SITE_OTHER)
Admission: RE | Admit: 2012-05-06 | Discharge: 2012-05-06 | Disposition: A | Discharge status: Home / Self Care | Payer: Medicare Other | Source: Ambulatory Visit | Attending: Family Medicine | Admitting: Family Medicine

## 2012-05-06 ENCOUNTER — Ambulatory Visit (INDEPENDENT_AMBULATORY_CARE_PROVIDER_SITE_OTHER): Payer: Medicare Other | Admitting: Family Medicine

## 2012-05-06 ENCOUNTER — Encounter: Payer: Medicare Other | Attending: Family Medicine | Admitting: *Deleted

## 2012-05-06 VITALS — Ht 75.0 in | Wt 223.1 lb

## 2012-05-06 VITALS — BP 124/72 | HR 80 | Temp 97.5°F | Ht 75.0 in | Wt 219.0 lb

## 2012-05-06 DIAGNOSIS — E669 Obesity, unspecified: Secondary | ICD-10-CM | POA: Diagnosis not present

## 2012-05-06 DIAGNOSIS — R0789 Other chest pain: Secondary | ICD-10-CM | POA: Insufficient documentation

## 2012-05-06 DIAGNOSIS — Z713 Dietary counseling and surveillance: Secondary | ICD-10-CM | POA: Insufficient documentation

## 2012-05-06 DIAGNOSIS — R071 Chest pain on breathing: Secondary | ICD-10-CM

## 2012-05-06 MED ORDER — HYDROCODONE-ACETAMINOPHEN 5-325 MG PO TABS: 1.0000 | ORAL_TABLET | Freq: Four times a day (QID) | ORAL | Status: DC | PRN

## 2012-05-06 NOTE — Progress Notes (Signed)
  Follow-up visit:  9 Months Post-Operative RYGB Surgery  Medical Nutrition Therapy:  Appt start time: 0800  End time:  0830.  Primary concerns today: Post-operative Bariatric Surgery Nutrition Management. [name] returns today for f/u with an additional 3.4 lb wt loss. Tingling and burning in feet resolved.  Multiple injuries reported from recent falls including broken rib and cuts on left arm and right wrist. Tripped in tennis shoes on linoleum.  No longer drinks sweetened drinks; switched to Mio. Protein intake highly decreased d/t intolerance. Discussed ways to increase.    Surgery date: 08/11/11 Surgery type: RYGB Start weight @ [facility]: 307.1 lbs Weight goal: ~215-220 lbs; main goal is to reverse diabetes  Weight today: 223.1 lbs Weight change: 3.4 lbs Total weight lost: 84.0 lbs BMI: 27.9 kg/m^2  Checks CGM: Most days Average FBG: 200 mg/dL Recent J8J: 19.1% (4/78/29 Dr. Sharl Ma) - reports recent prednisone treatment for 23 days and cipro for 7 days for bronchitis   24-hr recall: B (5-11:30 AM):  2 Boiled eggs, 1 pc toast OR bologna sandwich - eats about 3/4  Snk (AM): NONE L (2:30-3 PM): NONE or pc of deli meat Snk (PM): NONE or Townhouse crackers (5-10 ea) D (6:30-7 PM):  Snk (PM): NONE *NOTE: Can usually tolerate fish, steak, sometimes pork chops (only 1 oz at a time), [place] bologna, ham  Fluid intake: Water, crystal light, protein shakes ("every now and then"), carbonated diet drink mixed with water: ~ 64 oz Estimated total protein intake: ~ 20g  Medications:  See medication list. Endo added Levemir for 30 days to regulate BGs.  Supplementation: See above. Does not take others d/t being "allergic" to them. Broke out in a rash as a child and again several years ago.  Using straws: No Drinking while eating: Sips only; helps food go down Hair loss: No Carbonated beverages:  Was drinking regular soda during prednisone treatment; None now!!! N/V/D/C: Nausea continues at times.  Reports 5 times/week. Many instances of a food not tolerated one day, then fine the next. Cannot tolerate eggs, hotdogs, lean hamburger, baked potatoes, sausage, salads.  No reflux reported. Dumping syndrome: None reported  Recent physical activity:  None for past week d/t fall and possible broken rib  Progress Towards Goal(s):  In progress.   Nutritional Diagnosis:  NI-5.7.1 Inadequate protein intake related to recent RYGB surgery as evidenced by patient consuming <30% of recommended goal. [place]-1.4 Altered GI function related to recent RYGB surgery as evidenced by patient reported vomiting after eating many foods.  Samples given during visit include:   Premier Protein shake: 1 ea Lot: [id number]; Exp: 02/21/13  Intervention:  Nutrition education/reinforcement.  Monitoring/Evaluation:  Dietary intake, exercise, and body weight. Follow up in 3 months for 12 month post-op visit.

## 2012-05-06 NOTE — Progress Notes (Signed)
  Subjective:    Patient ID: [name], male    DOB: 02-10-1940, 72 y.o.   MRN: [id number]  HPI  72 year old male with DM, Afib, COPD, depression, osteoporosis presents following injury to left side. He was leaning over from lawn mower to pick up limb on ground. He heard a pop and had severe pain in right lateral chest wall.  Some pain with deep breaths or moving. No big change in breathing. No redness , no bruising on left rib cage.  No fever.      Review of Systems  Constitutional: Negative for fever and fatigue.  HENT: Negative for ear pain.   Eyes: Negative for pain.  Respiratory: Negative for cough and wheezing.   Cardiovascular: Positive for chest pain. Negative for palpitations and leg swelling.  Gastrointestinal: Negative for abdominal pain.       Objective:   Physical Exam  Constitutional: Vital signs are normal. He appears well-developed and well-nourished.  HENT:  Head: Normocephalic.  Right Ear: Hearing normal.  Left Ear: Hearing normal.  Nose: Nose normal.  Mouth/Throat: Oropharynx is clear and moist and mucous membranes are normal.  Neck: Trachea normal. Carotid bruit is not present. No mass and no thyromegaly present.  Cardiovascular: Normal rate, regular rhythm and normal pulses.  Exam reveals no gallop, no distant heart sounds and no friction rub.   No murmur heard. No peripheral edema  Pulmonary/Chest: Effort normal and breath sounds normal. No respiratory distress. He has no decreased breath sounds. Chest wall is not dull to percussion. He exhibits tenderness and bony tenderness.  ttp focally over lower rib cage on left, does also have some pain in musculature surrounding in upper abdomen  Abdominal: Soft. Bowel sounds are normal.  Skin: Skin is warm, dry and intact. No rash noted.  Psychiatric: He has a normal mood and affect. His speech is normal and behavior is normal. Thought content normal.          Assessment & Plan:

## 2012-05-06 NOTE — Patient Instructions (Addendum)
Do not wear rib brace.  Take big breaths. Treat with tyulenol for pain, hydrocodone for breakthrough pain. Heat overarea.  Call if cough, congestion or fever develop.

## 2012-05-06 NOTE — Patient Instructions (Addendum)
Goals:  Follow Phase 3B: High Protein + Non-Starchy Vegetables  Eat 3-6 small meals/snacks, every 3-5 hrs - Avoid meal skipping  Increase lean protein foods to meet 80g goal  If protein is not tolerated, try to get a protein shake in  Avoid high fat foods to help alleviate nausea  Avoid drinking 15 minutes before, during and 30 minutes after eating  Contact myself and/or Dr. Daphine Deutscher if you continue to lose weight or are unable to tolerate enough protein

## 2012-05-06 NOTE — Assessment & Plan Note (Signed)
Rib injury versus muscle strain. Pt request X-ray to eval ribs. Recommend heat, pain control and measures to avoid pneumonia from decreased respiratory volume.[name]

## 2012-05-08 ENCOUNTER — Other Ambulatory Visit: Payer: Self-pay | Admitting: Family Medicine

## 2012-05-10 NOTE — Telephone Encounter (Signed)
Phoned into [facility] pharmacy. 

## 2012-05-12 DIAGNOSIS — B079 Viral wart, unspecified: Secondary | ICD-10-CM | POA: Diagnosis not present

## 2012-05-12 DIAGNOSIS — L570 Actinic keratosis: Secondary | ICD-10-CM | POA: Diagnosis not present

## 2012-05-12 DIAGNOSIS — B029 Zoster without complications: Secondary | ICD-10-CM | POA: Diagnosis not present

## 2012-05-25 ENCOUNTER — Ambulatory Visit (INDEPENDENT_AMBULATORY_CARE_PROVIDER_SITE_OTHER): Payer: Medicare Other

## 2012-05-25 DIAGNOSIS — I824Y9 Acute embolism and thrombosis of unspecified deep veins of unspecified proximal lower extremity: Secondary | ICD-10-CM | POA: Diagnosis not present

## 2012-05-25 DIAGNOSIS — Z7901 Long term (current) use of anticoagulants: Secondary | ICD-10-CM

## 2012-05-25 DIAGNOSIS — I4891 Unspecified atrial fibrillation: Secondary | ICD-10-CM

## 2012-05-25 DIAGNOSIS — Z5181 Encounter for therapeutic drug level monitoring: Secondary | ICD-10-CM

## 2012-05-25 LAB — POCT INR: INR: 2.2

## 2012-05-26 ENCOUNTER — Other Ambulatory Visit: Payer: Self-pay | Admitting: Family Medicine

## 2012-05-30 DIAGNOSIS — Z961 Presence of intraocular lens: Secondary | ICD-10-CM | POA: Diagnosis not present

## 2012-05-30 DIAGNOSIS — H469 Unspecified optic neuritis: Secondary | ICD-10-CM | POA: Diagnosis not present

## 2012-05-30 DIAGNOSIS — H2640 Unspecified secondary cataract: Secondary | ICD-10-CM | POA: Diagnosis not present

## 2012-06-11 ENCOUNTER — Other Ambulatory Visit: Payer: Self-pay | Admitting: Family Medicine

## 2012-06-14 DIAGNOSIS — E1149 Type 2 diabetes mellitus with other diabetic neurological complication: Secondary | ICD-10-CM | POA: Diagnosis not present

## 2012-06-14 DIAGNOSIS — E1142 Type 2 diabetes mellitus with diabetic polyneuropathy: Secondary | ICD-10-CM | POA: Diagnosis not present

## 2012-06-14 DIAGNOSIS — E785 Hyperlipidemia, unspecified: Secondary | ICD-10-CM | POA: Diagnosis not present

## 2012-06-14 DIAGNOSIS — I10 Essential (primary) hypertension: Secondary | ICD-10-CM | POA: Diagnosis not present

## 2012-06-14 NOTE — Telephone Encounter (Signed)
rx called to phar

## 2012-06-22 ENCOUNTER — Ambulatory Visit (INDEPENDENT_AMBULATORY_CARE_PROVIDER_SITE_OTHER): Payer: Medicare Other

## 2012-06-22 DIAGNOSIS — I4891 Unspecified atrial fibrillation: Secondary | ICD-10-CM

## 2012-06-22 DIAGNOSIS — Z5181 Encounter for therapeutic drug level monitoring: Secondary | ICD-10-CM | POA: Diagnosis not present

## 2012-06-22 DIAGNOSIS — Z7901 Long term (current) use of anticoagulants: Secondary | ICD-10-CM

## 2012-06-22 DIAGNOSIS — I824Y9 Acute embolism and thrombosis of unspecified deep veins of unspecified proximal lower extremity: Secondary | ICD-10-CM | POA: Diagnosis not present

## 2012-07-08 ENCOUNTER — Encounter: Payer: Medicare Other | Attending: Family Medicine | Admitting: *Deleted

## 2012-07-08 ENCOUNTER — Encounter: Payer: Self-pay | Admitting: *Deleted

## 2012-07-08 VITALS — Ht 75.0 in | Wt 219.6 lb

## 2012-07-08 DIAGNOSIS — Z713 Dietary counseling and surveillance: Secondary | ICD-10-CM | POA: Insufficient documentation

## 2012-07-08 DIAGNOSIS — E669 Obesity, unspecified: Secondary | ICD-10-CM | POA: Insufficient documentation

## 2012-07-08 NOTE — Progress Notes (Signed)
  Follow-up visit:  11 Months Post-Operative RYGB Surgery  Medical Nutrition Therapy:  Appt start time: 0800  End time:  0830.  Primary concerns today: Post-operative Bariatric Surgery Nutrition Management. [name] returns today for f/u with 3.5 lbs. No longer drinks sweetened drinks, though continues to drink watered down diet soda. Protein intake continues to be highly decreased d/t intolerance. Discussed ways to increase.    Surgery date: 08/11/11 Surgery type: RYGB Start weight @ [facility]: 307.1 lbs  Weight today: 219.6 lbs Weight change: 3.5 lbs Total weight lost: 87.5 lbs BMI: 27.4 kg/m^2  Weight goal: ~215-220 lbs; main goal is to reverse diabetes  Checks CGM: Most days Average FBG: 110 mg/dL Recent F6O: 13.0% (8/65/78 Dr. Sharl Ma) - no new A1c at this time   24-hr recall: B (7 AM): 2 pcs cinnamon toast (store bought), coffee OR 1/2 pc bacon, biscuit (regurgitated) Snk (AM): NONE L (1-2 PM): Banana or corned beef sandwich or cup of soup or frozen chinese meal (15g) Snk (PM): NONE D (6:30-7 PM):  Cabbage w/ vinegar (1 cup) and cornbread (4-5 oz pc) Snk (PM): NONE *NOTE: Can usually tolerate (2-3 oz) fish w/ 3 hushpuppies, 3 oz steak (2-3 x/wk), 1/2 bbq sandwich w/ 3-4 hushpuppies  Fluid intake: Water, crystal light, protein shakes ("every now and then"), carbonated diet soda mixed with water: ~ 64 oz Estimated total protein intake: ~ 20g  Medications:  See medication list. Completed insulin regimen.  Supplementation: See above. Does not take others d/t being "allergic" to them. Broke out in a rash as a child and again several years ago.  Using straws: No Drinking while eating: Sips only; helps food go down Hair loss: No Carbonated beverages:  Yes; Watered down diet soda (12 oz/day) N/V/D/C: Nausea continues at times. Reports regurgitation ~5+ times/week. Cannot tolerate steak, pork chops, and sometimes shakes; eggs, hotdogs, lean hamburger, baked potatoes, sausage, salads.  Likely  d/t fat content of food, though states he can sometimes tolerate same foods.  Dumping syndrome: None reported  Recent physical activity:  1/2-3/4 miles daily on treadmill; walking around house and to neighbors' houses; normal ADLs (mowing grass, etc)  Progress Towards Goal(s):  In progress.   Nutritional Diagnosis:  NI-5.7.1 Inadequate protein intake related to recent RYGB surgery as evidenced by patient consuming <30% of recommended goal. - CONTINUE [place]-1.4 Altered GI function related to recent RYGB surgery as evidenced by patient reported vomiting after eating many foods. - CONTINUE  Intervention:  Nutrition education/reinforcement.  Monitoring/Evaluation:  Dietary intake, exercise, and body weight. Follow up in 3 months for 15 month post-op visit.

## 2012-07-08 NOTE — Patient Instructions (Addendum)
Goals:  Follow Phase 3B: High Protein + Non-Starchy Vegetables  Avoid meal skipping!!  Increase lean protein foods to meet 80g goal  Open your [employer] cheese and add to meals  If protein is not tolerated, try to get a high protein shake in   Avoid high fat foods to help alleviate nausea  Avoid drinking 15 minutes before, during and 30 minutes after eating   Contact myself and/or Dr. Daphine Deutscher if you continue to lose weight or are unable to tolerate enough protein

## 2012-07-12 ENCOUNTER — Other Ambulatory Visit: Payer: Self-pay | Admitting: Dermatology

## 2012-07-12 DIAGNOSIS — C44621 Squamous cell carcinoma of skin of unspecified upper limb, including shoulder: Secondary | ICD-10-CM | POA: Diagnosis not present

## 2012-07-12 DIAGNOSIS — D0460 Carcinoma in situ of skin of unspecified upper limb, including shoulder: Secondary | ICD-10-CM | POA: Diagnosis not present

## 2012-07-12 DIAGNOSIS — L259 Unspecified contact dermatitis, unspecified cause: Secondary | ICD-10-CM | POA: Diagnosis not present

## 2012-07-12 DIAGNOSIS — C44611 Basal cell carcinoma of skin of unspecified upper limb, including shoulder: Secondary | ICD-10-CM | POA: Diagnosis not present

## 2012-07-12 DIAGNOSIS — L739 Follicular disorder, unspecified: Secondary | ICD-10-CM | POA: Diagnosis not present

## 2012-07-12 DIAGNOSIS — D485 Neoplasm of uncertain behavior of skin: Secondary | ICD-10-CM | POA: Diagnosis not present

## 2012-07-15 ENCOUNTER — Other Ambulatory Visit: Payer: Self-pay

## 2012-07-15 MED ORDER — FLUTICASONE-SALMETEROL 250-50 MCG/DOSE IN AEPB: 1.0000 | INHALATION_SPRAY | Freq: Two times a day (BID) | RESPIRATORY_TRACT | Status: DC

## 2012-07-15 MED ORDER — FUROSEMIDE 20 MG PO TABS: 20.0000 mg | ORAL_TABLET | Freq: Every day | ORAL | Status: DC

## 2012-07-15 NOTE — Telephone Encounter (Signed)
Pt left v/m with no contact info for refill advair and furosemide to [facility].

## 2012-07-19 ENCOUNTER — Other Ambulatory Visit: Payer: Self-pay | Admitting: Family Medicine

## 2012-07-20 ENCOUNTER — Ambulatory Visit (INDEPENDENT_AMBULATORY_CARE_PROVIDER_SITE_OTHER): Payer: Medicare Other

## 2012-07-20 DIAGNOSIS — I824Y9 Acute embolism and thrombosis of unspecified deep veins of unspecified proximal lower extremity: Secondary | ICD-10-CM

## 2012-07-20 DIAGNOSIS — I4891 Unspecified atrial fibrillation: Secondary | ICD-10-CM

## 2012-07-20 DIAGNOSIS — Z7901 Long term (current) use of anticoagulants: Secondary | ICD-10-CM

## 2012-07-20 DIAGNOSIS — Z5181 Encounter for therapeutic drug level monitoring: Secondary | ICD-10-CM

## 2012-07-20 LAB — POCT INR: INR: 2

## 2012-07-20 NOTE — Telephone Encounter (Signed)
rx called to pharmacy 

## 2012-07-20 NOTE — Telephone Encounter (Signed)
Ok, #30, 0 ref 

## 2012-07-25 DIAGNOSIS — M25519 Pain in unspecified shoulder: Secondary | ICD-10-CM | POA: Diagnosis not present

## 2012-07-28 ENCOUNTER — Other Ambulatory Visit: Payer: Self-pay | Admitting: Orthopaedic Surgery

## 2012-07-28 DIAGNOSIS — M25512 Pain in left shoulder: Secondary | ICD-10-CM

## 2012-07-29 ENCOUNTER — Other Ambulatory Visit: Payer: Self-pay | Admitting: Family Medicine

## 2012-08-01 ENCOUNTER — Telehealth: Payer: Self-pay | Admitting: Family Medicine

## 2012-08-01 NOTE — Telephone Encounter (Signed)
Forward to Dr. B

## 2012-08-01 NOTE — Telephone Encounter (Signed)
Fax refill request, received fax refill request for Fluoxetine 40 mg take 1 cap qd, but med list has pt taking fluoxetine 20 mg 1 cap qd, please advise

## 2012-08-02 MED ORDER — FLUOXETINE HCL 40 MG PO CAPS: 40.0000 mg | ORAL_CAPSULE | Freq: Every day | ORAL | Status: DC

## 2012-08-02 NOTE — Telephone Encounter (Signed)
Call pt to verify dose. Okay to refill dose he is actually taking.

## 2012-08-02 NOTE — Telephone Encounter (Signed)
Patient takes 40 mg and medication sent to pharmacy

## 2012-08-04 ENCOUNTER — Ambulatory Visit
Admission: RE | Admit: 2012-08-04 | Discharge: 2012-08-04 | Disposition: A | Discharge status: Home / Self Care | Payer: Medicare Other | Source: Ambulatory Visit | Attending: Orthopaedic Surgery | Admitting: Orthopaedic Surgery

## 2012-08-04 DIAGNOSIS — M19019 Primary osteoarthritis, unspecified shoulder: Secondary | ICD-10-CM | POA: Diagnosis not present

## 2012-08-04 DIAGNOSIS — M25512 Pain in left shoulder: Secondary | ICD-10-CM

## 2012-08-04 DIAGNOSIS — M719 Bursopathy, unspecified: Secondary | ICD-10-CM | POA: Diagnosis not present

## 2012-08-10 DIAGNOSIS — M25519 Pain in unspecified shoulder: Secondary | ICD-10-CM | POA: Diagnosis not present

## 2012-08-16 DIAGNOSIS — E1149 Type 2 diabetes mellitus with other diabetic neurological complication: Secondary | ICD-10-CM | POA: Diagnosis not present

## 2012-08-16 DIAGNOSIS — E1142 Type 2 diabetes mellitus with diabetic polyneuropathy: Secondary | ICD-10-CM | POA: Diagnosis not present

## 2012-08-16 DIAGNOSIS — E785 Hyperlipidemia, unspecified: Secondary | ICD-10-CM | POA: Diagnosis not present

## 2012-08-16 DIAGNOSIS — I10 Essential (primary) hypertension: Secondary | ICD-10-CM | POA: Diagnosis not present

## 2012-08-17 ENCOUNTER — Ambulatory Visit (INDEPENDENT_AMBULATORY_CARE_PROVIDER_SITE_OTHER): Payer: Medicare Other

## 2012-08-17 DIAGNOSIS — Z7901 Long term (current) use of anticoagulants: Secondary | ICD-10-CM | POA: Diagnosis not present

## 2012-08-17 DIAGNOSIS — I824Y9 Acute embolism and thrombosis of unspecified deep veins of unspecified proximal lower extremity: Secondary | ICD-10-CM | POA: Diagnosis not present

## 2012-08-17 DIAGNOSIS — I4891 Unspecified atrial fibrillation: Secondary | ICD-10-CM

## 2012-08-17 LAB — POCT INR: INR: 3

## 2012-08-21 ENCOUNTER — Other Ambulatory Visit: Payer: Self-pay | Admitting: Family Medicine

## 2012-08-23 NOTE — Telephone Encounter (Signed)
rx called to pharmacy 

## 2012-09-08 DIAGNOSIS — M25819 Other specified joint disorders, unspecified shoulder: Secondary | ICD-10-CM | POA: Diagnosis not present

## 2012-09-08 DIAGNOSIS — M19019 Primary osteoarthritis, unspecified shoulder: Secondary | ICD-10-CM | POA: Diagnosis not present

## 2012-09-08 DIAGNOSIS — M75100 Unspecified rotator cuff tear or rupture of unspecified shoulder, not specified as traumatic: Secondary | ICD-10-CM | POA: Diagnosis not present

## 2012-09-14 ENCOUNTER — Ambulatory Visit (INDEPENDENT_AMBULATORY_CARE_PROVIDER_SITE_OTHER): Payer: Medicare Other

## 2012-09-14 DIAGNOSIS — I824Y9 Acute embolism and thrombosis of unspecified deep veins of unspecified proximal lower extremity: Secondary | ICD-10-CM

## 2012-09-14 DIAGNOSIS — I4891 Unspecified atrial fibrillation: Secondary | ICD-10-CM

## 2012-09-14 DIAGNOSIS — Z7901 Long term (current) use of anticoagulants: Secondary | ICD-10-CM

## 2012-09-14 LAB — POCT INR: INR: 2.1

## 2012-09-16 ENCOUNTER — Telehealth: Payer: Self-pay

## 2012-09-16 NOTE — Telephone Encounter (Signed)
[facility] request status of med record request for [name]; Case # [id number]; advised received today; [employer] will pick up 09/20/12 and takes 7-10 business days for completion. Patty voiced understanding.

## 2012-09-24 ENCOUNTER — Other Ambulatory Visit: Payer: Self-pay | Admitting: Family Medicine

## 2012-09-26 DIAGNOSIS — N401 Enlarged prostate with lower urinary tract symptoms: Secondary | ICD-10-CM | POA: Diagnosis not present

## 2012-09-26 NOTE — Telephone Encounter (Signed)
Received refill request electronically. Last office visit 05/06/12. Is it okay to refill?

## 2012-09-27 NOTE — Telephone Encounter (Signed)
Rx called to pharmacy

## 2012-10-03 ENCOUNTER — Other Ambulatory Visit: Payer: Self-pay | Admitting: Family Medicine

## 2012-10-04 NOTE — Telephone Encounter (Signed)
Ok to fill? No future appts scheduled 

## 2012-10-05 DIAGNOSIS — M9981 Other biomechanical lesions of cervical region: Secondary | ICD-10-CM | POA: Diagnosis not present

## 2012-10-05 DIAGNOSIS — M999 Biomechanical lesion, unspecified: Secondary | ICD-10-CM | POA: Diagnosis not present

## 2012-10-05 DIAGNOSIS — M542 Cervicalgia: Secondary | ICD-10-CM | POA: Diagnosis not present

## 2012-10-05 DIAGNOSIS — IMO0002 Reserved for concepts with insufficient information to code with codable children: Secondary | ICD-10-CM | POA: Diagnosis not present

## 2012-10-07 ENCOUNTER — Ambulatory Visit: Payer: Medicare Other | Admitting: *Deleted

## 2012-10-12 ENCOUNTER — Ambulatory Visit (INDEPENDENT_AMBULATORY_CARE_PROVIDER_SITE_OTHER): Payer: Medicare Other

## 2012-10-12 ENCOUNTER — Ambulatory Visit (INDEPENDENT_AMBULATORY_CARE_PROVIDER_SITE_OTHER): Payer: Medicare Other | Admitting: Surgery

## 2012-10-12 ENCOUNTER — Encounter (INDEPENDENT_AMBULATORY_CARE_PROVIDER_SITE_OTHER): Payer: Self-pay | Admitting: Surgery

## 2012-10-12 VITALS — BP 128/72 | HR 68 | Resp 16 | Ht 75.0 in | Wt 218.8 lb

## 2012-10-12 DIAGNOSIS — I4891 Unspecified atrial fibrillation: Secondary | ICD-10-CM | POA: Diagnosis not present

## 2012-10-12 DIAGNOSIS — Z9884 Bariatric surgery status: Secondary | ICD-10-CM | POA: Diagnosis not present

## 2012-10-12 DIAGNOSIS — Z7901 Long term (current) use of anticoagulants: Secondary | ICD-10-CM | POA: Diagnosis not present

## 2012-10-12 DIAGNOSIS — I824Y9 Acute embolism and thrombosis of unspecified deep veins of unspecified proximal lower extremity: Secondary | ICD-10-CM

## 2012-10-12 LAB — POCT INR: INR: 2.8

## 2012-10-12 NOTE — Progress Notes (Signed)
[name] 72 y.o.  Body mass index is 27.35 kg/(m^2).  Patient Active Problem List   Diagnosis Date Noted  . Left-sided chest wall pain 05/06/2012  . Peripheral edema 03/29/2012  . Asthma exacerbation 03/29/2012  . Peripheral neuropathy 12/11/2011  . 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 Atrial fibrillation, permanent 08/12/2011  . Bacterial overgrowth syndrome 10/28/2010  . Obesity 10/28/2010  . GERD (gastroesophageal reflux disease) 10/14/2010  . Nonsurgical dumping syndrome 10/14/2010  . Diverticulitis of colon 09/02/2010  . Acute venous embolism and thrombosis of deep vessels of proximal lower extremity 08/01/2010  . OTHER DISEASES OF NASAL CAVITY AND SINUSES 12/24/2009  . OSTEOPOROSIS, DRUG-INDUCED 11/26/2009  . UNSPECIFIED ANEMIA 09/02/2009  . RENAL INSUFFICIENCY 09/02/2009  . ALLERGIC RHINITIS CAUSE UNSPECIFIED 01/17/2009  . ASTHMA, PERSISTENT, MILD 11/13/2008  . FOOT SURGERY, HX OF 12/18/2006  . KNEE REPLACEMENT, LEFT, HX OF 12/18/2006  . HERPES ZOSTER W/NERVOUS COMPLICATION NEC 07/01/2006  . HYPERCHOLESTEROLEMIA 05/19/2006  . ANXIETY 05/19/2006  . DEPRESSION 05/19/2006  . RESTLESS LEG SYNDROME 05/19/2006  . HYPERTENSION 05/19/2006  . ATRIAL FIBRILLATION, PAROXYSMAL 05/19/2006  . GASTROPARESIS 05/19/2006  . DIVERTICULOSIS, COLON 05/19/2006  . IRRITABLE BOWEL SYNDROME 05/19/2006  . LOW BACK PAIN, CHRONIC 05/19/2006  . DM 02/10/1992    Allergies  Allergen Reactions  . Sulfacetamide Sodium     REACTION: Throat swelling  . Oxycodone Other (See Comments)    CONFUSION / HALLUCINATIONS    Past Surgical History  Procedure Laterality Date  . Back surgery      X 4   . Knee surgery      Bilateral x 6  . Artery repair      Left forearm  . Foot surgery      Left foot   . Replacement total knee      Left knee   . Breath tek h pylori  01/08/2011    Procedure: BREATH TEK H PYLORI;  Surgeon: Valarie Merino, MD;  Location: [facility];  Service:  General;  Laterality: N/A;  . Eye surgery    . Hand surgery      LEFT  . Leg surgery      FOR NECROTIZING FASCITIS L LEG AND GROIN  . 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 Shoulder arthroscopy     [name], MD No diagnosis found.  Postop visit for [name] who is now one year out from his Roux-en-Y gastric bypass. He does have some problems with certain foods but otherwise overall is done very well. He's lost in excess of 92 pounds to surgery he is having some breakdown beneath this pannus on the left. He treats this  vigorously with medication. I've asked for him to come back with a picture when this is inflammed as he gets oftentimes. We can likely submit this to see if if he can have a panniculectomy which he would like to do. Jonathan B. Daphine Deutscher, MD, [facility] [phone] beeper [phone]  10/12/2012 12:22 PM

## 2012-10-13 DIAGNOSIS — Z85828 Personal history of other malignant neoplasm of skin: Secondary | ICD-10-CM | POA: Diagnosis not present

## 2012-10-13 DIAGNOSIS — L820 Inflamed seborrheic keratosis: Secondary | ICD-10-CM | POA: Diagnosis not present

## 2012-10-14 ENCOUNTER — Ambulatory Visit: Payer: Medicare Other | Admitting: *Deleted

## 2012-10-25 ENCOUNTER — Ambulatory Visit (INDEPENDENT_AMBULATORY_CARE_PROVIDER_SITE_OTHER): Payer: Medicare Other | Admitting: Internal Medicine

## 2012-10-25 ENCOUNTER — Encounter: Payer: Self-pay | Admitting: Internal Medicine

## 2012-10-25 VITALS — BP 114/72 | HR 88 | Temp 97.6°F | Wt 215.0 lb

## 2012-10-25 DIAGNOSIS — L282 Other prurigo: Secondary | ICD-10-CM | POA: Diagnosis not present

## 2012-10-25 DIAGNOSIS — S30860A Insect bite (nonvenomous) of lower back and pelvis, initial encounter: Secondary | ICD-10-CM | POA: Diagnosis not present

## 2012-10-25 MED ORDER — METHYLPREDNISOLONE ACETATE 40 MG/ML IJ SUSP
80.0000 mg | Freq: Once | INTRAMUSCULAR | Status: AC
  Administered 2012-10-25: 80 mg via INTRAMUSCULAR

## 2012-10-25 NOTE — Progress Notes (Signed)
Subjective:    Patient ID: [name], male    DOB: 10/25/1940, 72 y.o.   MRN: [id number]  HPI  Pt presents to the clinic today with c/o a tick bite on his left buttock. This occurred 8 days ago. He was out mowing grass when he got this tick. He thinks it may have been on there for a max of 8 hours. He did pull it out with the head intact. He did clean it with alcohol and soap/water. He denies fever, chills, body aches, nausea or vomiting. He has broken out in a generalized pruritic rash. It is very itchy. He has taken some Benadryl but it did not help that much.  Review of Systems      Past Medical History  Diagnosis Date  . Diverticulosis of colon (without mention of hemorrhage)   . Other pulmonary embolism and infarction   . Restless legs syndrome (RLS)   . Obesity, unspecified   . Irritable bowel syndrome   . Gastroparesis   . Unspecified essential hypertension   . Lumbago   . Depressive disorder, not elsewhere classified   . Anxiety state, unspecified   . Unspecified asthma(493.90)   . Esophageal reflux   . Atrial fibrillation   . Pure hypercholesterolemia   . Type II or unspecified type diabetes mellitus without mention of complication, not stated as uncontrolled   . Hemorrhoids   . Back pain, chronic   . Hx of gout   . Bruises easily   . Cancer     SKIN CANCER REMOVED  . Sleep apnea     STOP BANG SCORE 6    Current Outpatient Prescriptions  Medication Sig Dispense Refill  . atorvastatin (LIPITOR) 80 MG tablet Take 1 tablet by mouth   daily  90 tablet  3  . furosemide (LASIX) 20 MG tablet Take 1- to  tablets (20-40  mg total) by mouth daily.  135 tablet  3  . HYDROcodone-acetaminophen (NORCO/VICODIN) 5-325 MG per tablet Take 1 tablet by mouth every 6 (six) hours as needed for pain.  30 tablet  0  . lisinopril (PRINIVIL,ZESTRIL) 5 MG tablet       . metFORMIN (GLUCOPHAGE) 500 MG tablet       . metoprolol succinate (TOPROL-XL) 25 MG 24 hr tablet Take 1 tablet by  mouth  twice a day  90 tablet  3  . omeprazole (PRILOSEC) 40 MG capsule Tae 1 capsule by mouth two  times daily  180 capsule  3  . potassium chloride (KLOR-CON) 20 MEQ packet Take 20 mEq by mouth as needed (when taking 2 tab lasix).      [name] RAPAFLO 8 MG CAPS capsule Take 8 mg by mouth daily with breakfast.       . triamcinolone cream (KENALOG) 0.1 %       . warfarin (COUMADIN) 2.5 MG tablet Take 1 tablet by mouth  daily as directed  90 tablet  1  . zolpidem (AMBIEN) 5 MG tablet TAKE 1-2 TABLETS BY MOUTH AT BEDTIME  30 tablet  0  . FLUoxetine (PROZAC) 40 MG capsule Take 1 capsule (40 mg total) by mouth daily.  90 capsule  3   No current facility-administered medications for this visit.    Allergies  Allergen Reactions  . Sulfacetamide Sodium     REACTION: Throat swelling  . Oxycodone Other (See Comments)    CONFUSION / HALLUCINATIONS    Family History  Problem Relation Age of Onset  . Uterine cancer  Mother     mets  . Breast cancer Mother   . Cancer Mother     cervical  . Emphysema Father   . Alzheimer's disease Sister   . Prostate cancer Brother   . Heart attack Brother   . Colon cancer Brother 66    History   Social History  . Marital Status: Married    Spouse Name: N/A    Number of Children: 1  . Years of Education: N/A   Occupational History  . Retired     Naval architect   Social History Main Topics  . Smoking status: Never Smoker   . Smokeless tobacco: Never Used  . Alcohol Use: No  . Drug Use: No  . Sexual Activity: Not on file   Other Topics Concern  . Not on file   Social History Narrative   DIET: 3 meals, F&V, some water, crystal light, No fast food   Exercise: walks treadmill 30 min      1 Caffeine drinks daily       No living will.            Constitutional: Denies fever, malaise, fatigue, headache or abrupt weight changes.  Gastrointestinal: Denies abdominal pain, bloating, constipation, diarrhea or blood in the stool.  Musculoskeletal:  Denies decrease in range of motion, difficulty with gait, muscle pain or joint pain and swelling.  Skin: Pt reports generalized rash and tick bite to left buttock. Denies redness, lesions or ulcercations.  Neurological: Denies dizziness, difficulty with memory, difficulty with speech or problems with balance and coordination.   No other specific complaints in a complete review of systems (except as listed in HPI above).  Objective:   Physical Exam   BP 114/72  Pulse 88  Temp(Src) 97.6 F (36.4 C) (Oral)  Wt 215 lb (97.523 kg)  BMI 26.87 kg/m2  SpO2 99% Wt Readings from Last 3 Encounters:  10/25/12 215 lb (97.523 kg)  10/12/12 218 lb 12.8 oz (99.247 kg)  07/08/12 219 lb 9.6 oz (99.61 kg)    General: Appears his stated age, well developed, well nourished in NAD. Skin: Warm, dry and intact. Generalized pruritic rash noted. No lesions/erythema migrans noted at sick of tick bite.  Cardiovascular: Normal rate and rhythm. S1,S2 noted.  No murmur, rubs or gallops noted. No JVD or BLE edema. No carotid bruits noted. Pulmonary/Chest: Normal effort and positive vesicular breath sounds. No respiratory distress. No wheezes, rales or ronchi noted.  Abdomen: Soft and nontender. Normal bowel sounds, no bruits noted. No distention or masses noted. Liver, spleen and kidneys non palpable. Musculoskeletal: Normal range of motion. No signs of joint swelling. No difficulty with gait.    BMET    Component Value Date/Time   NA 137 03/07/2012 1142   K 4.3 03/07/2012 1142   CL 104 03/07/2012 1142   CO2 22 03/07/2012 1142   GLUCOSE 195* 03/07/2012 1142   BUN 24* 03/07/2012 1142   CREATININE 1.4 03/07/2012 1142   CALCIUM 9.3 03/07/2012 1142   GFRNONAA 61* 08/14/2011 0413   GFRAA 70* 08/14/2011 0413    Lipid Panel     Component Value Date/Time   CHOL 227* 03/07/2012 1142   TRIG 205.0* 03/07/2012 1142   HDL 36.50* 03/07/2012 1142   CHOLHDL 6 03/07/2012 1142   VLDL 41.0* 03/07/2012 1142    CBC    Component  Value Date/Time   WBC 7.6 03/07/2012 1142   RBC 4.65 03/07/2012 1142   HGB 14.8 03/07/2012 1142  HCT 43.9 03/07/2012 1142   PLT 200.0 03/07/2012 1142   MCV 94.4 03/07/2012 1142   MCH 31.4 08/13/2011 0430   MCHC 33.6 03/07/2012 1142   RDW 14.4 03/07/2012 1142   LYMPHSABS 2.0 03/07/2012 1142   MONOABS 0.6 03/07/2012 1142   EOSABS 0.1 03/07/2012 1142   BASOSABS 0.1 03/07/2012 1142    Hgb A1C Lab Results  Component Value Date   HGBA1C 8.3* 03/07/2012        Assessment & Plan:   Generalized pruritic rash secondary to tick bite, new onset:  Benadryl ineffective Will give 80 mg Depo for the rash Not a candidate for tick born illness prophylaxis Monitor for fatigue, confusion, fever, nausea, diarrhea, or joint pain  RTC as needed or if rash does not clear within 2-3 days

## 2012-10-25 NOTE — Addendum Note (Signed)
Addended by: Criselda Peaches B on: 10/25/2012 09:11 AM   Modules accepted: Orders

## 2012-10-25 NOTE — Patient Instructions (Signed)
Wood Tick Bite Ticks are insects that attach themselves to the skin. Most tick bites are harmless, but sometimes ticks carry diseases that can make a person quite ill. The chance of getting ill depends on:  The kind of tick that bites you.  Time of year.  How long the tick is attached.  Geographic location. Wood ticks are also called dog ticks. They are generally black. They can have white markings. They live in shrubs and grassy areas. They are larger than deer ticks. Wood ticks are about the size of a watermelon seed. They have a hard body. The most common places for ticks to attach themselves are the scalp, neck, armpits, waist, and groin. Wood ticks may stay attached for up to 2 weeks. TICKS MUST BE REMOVED AS SOON AS POSSIBLE TO HELP PREVENT DISEASES CAUSED BY TICK BITES.  TO REMOVE A TICK: 1. If available, put on latex gloves before trying to remove a tick. 2. Grasp the tick as close to the skin as possible, with curved forceps, fine tweezers or a special tick removal tool. 3. Pull gently with steady pressure until the tick lets go. Do not twist the tick or jerk it suddenly. This may break off the tick's head or mouth parts. 4. Do not crush the tick's body. This could force disease-carrying fluids from the tick into your body. 5. After the tick is removed, wash the bite area and your hands with soap and water or other disinfectant. 6. Apply a small amount of antiseptic cream or ointment to the bite site. 7. Wash and disinfect any instruments that were used. 8. Save the tick in a jar or plastic bag for later identification. Preserve the tick with a bit of alcohol or put it in the freezer. 9. Do not apply a hot match, petroleum jelly, or fingernail polish to the tick. This does not work and may increase the chances of disease from the tick bite. YOU MAY NEED TO SEE YOUR CAREGIVER FOR A TETANUS SHOT NOW IF:  You have no idea when you had the last one.  You have never had a tetanus shot  before. If you need a tetanus shot, and you decide not to get one, there is a rare chance of getting tetanus. Sickness from tetanus can be serious. If you get a tetanus shot, your arm may swell, get red and warm to the touch at the shot site. This is common and not a problem. PREVENTION  Wear protective clothing. Long sleeves and pants are best.  Wear white clothes to see ticks more easily  Tuck your pant legs into your socks.  If walking on trail, stay in the middle of the trail to avoid brushing against bushes.  Put insect repellent on all exposed skin and along boot tops, pant legs and sleeve cuffs  Check clothing, hair and skin repeatedly and before coming inside.  Brush off any ticks that are not attached. SEEK MEDICAL CARE IF:   You cannot remove a tick or part of the tick that is left in the skin.  Unexplained fever.  Redness and swelling in the area of the tick bite.  Tender, swollen lymph glands.  Diarrhea.  Weight loss.  Cough.  Fatigue.  Muscle, joint or bone pain.  Belly pain.  Headache.  Rash. SEEK IMMEDIATE MEDICAL CARE IF:   You develop an oral temperature above 102 F (38.9 C).  You are having trouble walking or moving your legs.  Numbness in the legs.    pain.   Belly pain.   Headache.   Rash.  SEEK IMMEDIATE MEDICAL CARE IF:    You develop an oral temperature above 102 F (38.9 C).   You are having trouble walking or moving your legs.   Numbness in the legs.   Shortness of breath.   Confusion.   Repeated vomiting.  Document Released: 01/24/2000 Document Revised: 04/20/2011 Document Reviewed: 01/02/2008  [employer] Patient Information 2014 [employer].

## 2012-10-28 ENCOUNTER — Encounter: Payer: Self-pay | Admitting: *Deleted

## 2012-10-28 ENCOUNTER — Encounter: Payer: Medicare Other | Attending: Surgery | Admitting: *Deleted

## 2012-10-28 VITALS — Ht 75.0 in | Wt 215.3 lb

## 2012-10-28 DIAGNOSIS — E669 Obesity, unspecified: Secondary | ICD-10-CM | POA: Insufficient documentation

## 2012-10-28 DIAGNOSIS — Z713 Dietary counseling and surveillance: Secondary | ICD-10-CM | POA: Diagnosis not present

## 2012-10-28 NOTE — Progress Notes (Signed)
  Follow-up visit:  13.5 Months Post-Operative RYGB Surgery  Medical Nutrition Therapy:  Appt start time: 0800  End time:  0830.  Primary concerns today: Post-operative Bariatric Surgery Nutrition Management. [name] returns today for f/u with 4.3 lb weight loss. Protein intake continues to be highly decreased d/t intolerance. Discussed adding protein powder to foods to increase.  Also states he has "no interest in food" and reports his heart has been "hurting" for the last week. Advised to contact Dr. Antoine Poche asap.   Surgery date: 08/11/11 Surgery type: RYGB Start weight @ [facility]: 307.1 lbs  Weight today: 215.3 lbs Weight change: 4.3 lbs Total weight lost: 91.8 lbs BMI: 26.9 kg/m^2 Weight goal: ~215-220 lbs; main goal is to reverse diabetes  Checks CGM: Most days Average FBG: 93-125 mg/dL; Today: 116 mg/dL Recent R6E: 45.4% (0/98/11 Dr. Sharl Ma) - no new A1c at this time   Fluid intake: Water, crystal light, protein shakes ("every now and then"), carbonated diet soda mixed with water: ~ 64 oz Estimated total protein intake: ~ 0-20g (CONTINUES TO BE DECREASED)  Medications:  Metformin added.  Supplementation: See above. Does not take others d/t being "allergic" to them. Broke out in a rash as a child and again several years ago.  Using straws: No Drinking while eating: Sips only; helps food go down Hair loss: No Carbonated beverages:  Yes; Watered down diet soda (12 oz/day) N/V/D/C: Nausea continues at times. Reports regurgitation ~5+ times/week. Cannot tolerate steak, pork chops, and sometimes shakes; cheese, eggs, hotdogs, lean hamburger, baked potatoes, sausage, salads.  Likely d/t fat content of food and/or reheating proteins with little added moisture, though states he can sometimes tolerate same foods.  Dumping syndrome: None reported  Recent physical activity:  30 min daily on treadmill; walking around house and to neighbors' houses; normal ADLs (mowing grass, etc)  Progress Towards  Goal(s):  In progress.   Nutritional Diagnosis:  NI-5.7.1 Inadequate protein intake related to recent RYGB surgery as evidenced by patient consuming <0-25% of recommended goal. - CONTINUE [place]-1.4 Altered GI function related to recent RYGB surgery as evidenced by patient reported vomiting after eating many foods. - CONTINUE  Intervention:  Nutrition education/reinforcement.  Monitoring/Evaluation:  Dietary intake, exercise, and body weight. Follow up in 3 months for 15 month post-op visit.

## 2012-10-28 NOTE — Patient Instructions (Addendum)
Goals:  Follow Phase 3B: High Protein + Non-Starchy Vegetables  Avoid meal skipping!!  Increase lean protein foods to meet 80g goal  Open your [employer] cheese and add to meals  If protein is not tolerated, try adding protein powder to foods  Avoid high fat foods to help alleviate nausea  Avoid drinking 15 minutes before, during and 30 minutes after eating  Contact myself and/or Dr. Daphine Deutscher if you continue to lose weight or are unable to tolerate enough protein

## 2012-11-04 ENCOUNTER — Other Ambulatory Visit: Payer: Self-pay | Admitting: Family Medicine

## 2012-11-06 NOTE — Telephone Encounter (Signed)
Last office visit with Dr. Ermalene Searing  05/06/2012.  Ok to refill?

## 2012-11-08 NOTE — Telephone Encounter (Signed)
[facility] 

## 2012-11-09 ENCOUNTER — Ambulatory Visit (INDEPENDENT_AMBULATORY_CARE_PROVIDER_SITE_OTHER): Payer: Medicare Other | Admitting: General Practice

## 2012-11-09 DIAGNOSIS — I4891 Unspecified atrial fibrillation: Secondary | ICD-10-CM | POA: Diagnosis not present

## 2012-11-09 DIAGNOSIS — I824Y9 Acute embolism and thrombosis of unspecified deep veins of unspecified proximal lower extremity: Secondary | ICD-10-CM

## 2012-11-09 DIAGNOSIS — Z7901 Long term (current) use of anticoagulants: Secondary | ICD-10-CM | POA: Diagnosis not present

## 2012-11-09 LAB — POCT INR: INR: 3.2

## 2012-11-17 DIAGNOSIS — K5730 Diverticulosis of large intestine without perforation or abscess without bleeding: Secondary | ICD-10-CM | POA: Diagnosis not present

## 2012-11-17 DIAGNOSIS — R300 Dysuria: Secondary | ICD-10-CM | POA: Diagnosis not present

## 2012-11-17 DIAGNOSIS — M545 Low back pain: Secondary | ICD-10-CM | POA: Diagnosis not present

## 2012-11-17 DIAGNOSIS — N401 Enlarged prostate with lower urinary tract symptoms: Secondary | ICD-10-CM | POA: Diagnosis not present

## 2012-11-17 DIAGNOSIS — N281 Cyst of kidney, acquired: Secondary | ICD-10-CM | POA: Diagnosis not present

## 2012-11-18 ENCOUNTER — Encounter: Payer: Self-pay | Admitting: Cardiology

## 2012-11-18 ENCOUNTER — Ambulatory Visit (INDEPENDENT_AMBULATORY_CARE_PROVIDER_SITE_OTHER): Payer: Medicare Other | Admitting: Cardiology

## 2012-11-18 VITALS — BP 117/80 | HR 72 | Ht 75.0 in | Wt 217.0 lb

## 2012-11-18 DIAGNOSIS — I4821 Permanent atrial fibrillation: Secondary | ICD-10-CM

## 2012-11-18 DIAGNOSIS — I10 Essential (primary) hypertension: Secondary | ICD-10-CM | POA: Diagnosis not present

## 2012-11-18 DIAGNOSIS — I4891 Unspecified atrial fibrillation: Secondary | ICD-10-CM

## 2012-11-18 MED ORDER — LISINOPRIL 5 MG PO TABS: 2.5000 mg | ORAL_TABLET | Freq: Every day | ORAL | Status: DC

## 2012-11-18 MED ORDER — WARFARIN SODIUM 2.5 MG PO TABS: ORAL_TABLET | ORAL | Status: DC

## 2012-11-18 NOTE — Patient Instructions (Addendum)
Your physician has recommended you make the following change in your medication:  1) Decrease your Lisinopril to 2.5mg  daily  Your physician wants you to follow-up in: one year with Dr. Antoine Poche. You will receive a reminder letter in the mail two months in advance. If you don't receive a letter, please call our office to schedule the follow-up appointment.

## 2012-11-18 NOTE — Progress Notes (Signed)
HPI The patient presents for followup of atrial fibrillation.  Since I last saw him he has continued to do well following gastric bypass surgery. He denies any chest pressure, neck or arm discomfort. He rarely notices palpitations. He has had some lightheaded episodes particularly with positional changes. However, he's not had any frank syncope. He denies PND or orthopnea.  He has no new dyspnea.  He exercises every day.   Allergies  Allergen Reactions  . Sulfacetamide Sodium     REACTION: Throat swelling  . Oxycodone Other (See Comments)    CONFUSION / HALLUCINATIONS    Current Outpatient Prescriptions  Medication Sig Dispense Refill  . atorvastatin (LIPITOR) 80 MG tablet Take 1 tablet by mouth   daily  90 tablet  3  . FLUoxetine (PROZAC) 40 MG capsule Take 1 capsule (40 mg total) by mouth daily.  90 capsule  3  . furosemide (LASIX) 20 MG tablet Take 1- to  tablets (20-40  mg total) by mouth daily.  135 tablet  3  . lisinopril (PRINIVIL,ZESTRIL) 5 MG tablet Take 5 mg by mouth daily.       . metFORMIN (GLUCOPHAGE) 500 MG tablet Take 500 mg by mouth 2 (two) times daily with a meal.       . metoprolol succinate (TOPROL-XL) 25 MG 24 hr tablet Take 1 tablet by mouth  twice a day  90 tablet  3  . omeprazole (PRILOSEC) 40 MG capsule Tae 1 capsule by mouth two  times daily  180 capsule  3  . potassium chloride (KLOR-CON) 20 MEQ packet Take 20 mEq by mouth as needed (when taking 2 tab lasix).      [name] RAPAFLO 8 MG CAPS capsule Take 8 mg by mouth daily with breakfast.       . triamcinolone cream (KENALOG) 0.1 %       . warfarin (COUMADIN) 2.5 MG tablet Take 1 tablet by mouth  daily as directed  90 tablet  1  . zolpidem (AMBIEN) 5 MG tablet TAKE 1-2 TABLETS BY MOUTH AT BEDTIME  30 tablet  0   No current facility-administered medications for this visit.    Past Medical History  Diagnosis Date  . Diverticulosis of colon (without mention of hemorrhage)   . Other pulmonary embolism and infarction    . Restless legs syndrome (RLS)   . Obesity, unspecified   . Irritable bowel syndrome   . Gastroparesis   . Unspecified essential hypertension   . Lumbago   . Depressive disorder, not elsewhere classified   . Anxiety state, unspecified   . Unspecified asthma(493.90)   . Esophageal reflux   . Atrial fibrillation   . Pure hypercholesterolemia   . Type II or unspecified type diabetes mellitus without mention of complication, not stated as uncontrolled   . Hemorrhoids   . Back pain, chronic   . Hx of gout   . Bruises easily   . Cancer     SKIN CANCER REMOVED  . Sleep apnea     STOP BANG SCORE 6    Past Surgical History  Procedure Laterality Date  . Back surgery      X 4   . Knee surgery      Bilateral x 6  . Artery repair      Left forearm  . Foot surgery      Left foot   . Replacement total knee      Left knee   . Breath tek h pylori  01/08/2011    Procedure: BREATH TEK H PYLORI;  Surgeon: Valarie Merino, MD;  Location: [facility];  Service: General;  Laterality: N/A;  . Eye surgery    . Hand surgery      LEFT  . Leg surgery      FOR NECROTIZING FASCITIS L LEG AND GROIN  . 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 Shoulder arthroscopy      ROS:  ED. Otherwise as stated in the HPI and negative for all other systems.  PHYSICAL EXAM BP 117/80  Pulse 72  Ht 6\' 3"  (1.905 m)  Wt 217 lb (98.431 kg)  BMI 27.12 kg/m2 GENERAL:  Well appearing HEENT:  Pupils equal round and reactive, fundi not visualized, oral mucosa unremarkable, dentures NECK:  No jugular venous distention, waveform within normal limits, carotid upstroke brisk and symmetric, no bruits, no thyromegaly LYMPHATICS:  No cervical, inguinal adenopathy LUNGS:  Clear to auscultation bilaterally BACK:  No CVA tenderness CHEST:  Unremarkable HEART:  PMI  not displaced or sustained,S1 and S2 within normal limits, no S3, no clicks, no rubs, no murmurs,  irregular ABD:  Flat, positive bowel sounds normal in frequency in pitch, no bruits, no rebound, no guarding, no midline pulsatile mass, no hepatomegaly, no splenomegaly, , well healed surgical scores EXT:  2 plus pulses throughout, no edema, no cyanosis no clubbing SKIN:  No rashes no nodules, bruising  EKG:  Atrial fibrillation, rate 72, axis within normal limits, intervals within normal limits, no acute ST-T wave changes. 11/18/2012  ASSESSMENT AND PLAN  ATRIAL FIBRILLATION, PAROXYSMAL He tolerates this rhythm with rate control and anticoagulation. No further testing is indicated. He will remain on warfarin. He is  HYPERTENSION  His blood pressure now is running low and his having dizziness. Therefore, he would reduce his lisinopril to 2.5 mg daily.  OBESITY He is now about 90 pounds of weight loss with surgery. 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  ERECTILE DYSFUNCTION Medications did not help and he will discuss this with his urologist.

## 2012-11-27 DIAGNOSIS — Z23 Encounter for immunization: Secondary | ICD-10-CM | POA: Diagnosis not present

## 2012-12-03 ENCOUNTER — Other Ambulatory Visit: Payer: Self-pay | Admitting: Family Medicine

## 2012-12-03 NOTE — Telephone Encounter (Signed)
Last office visit 10/25/2012 with [name]. Ok to refill?

## 2012-12-05 NOTE — Telephone Encounter (Signed)
[facility] 

## 2012-12-07 ENCOUNTER — Ambulatory Visit (INDEPENDENT_AMBULATORY_CARE_PROVIDER_SITE_OTHER): Payer: Medicare Other | Admitting: General Practice

## 2012-12-07 DIAGNOSIS — Z7901 Long term (current) use of anticoagulants: Secondary | ICD-10-CM | POA: Diagnosis not present

## 2012-12-07 DIAGNOSIS — I824Y9 Acute embolism and thrombosis of unspecified deep veins of unspecified proximal lower extremity: Secondary | ICD-10-CM | POA: Diagnosis not present

## 2012-12-07 DIAGNOSIS — I4891 Unspecified atrial fibrillation: Secondary | ICD-10-CM

## 2012-12-09 ENCOUNTER — Encounter: Payer: Medicare Other | Attending: Surgery | Admitting: Dietician

## 2012-12-09 VITALS — Ht 75.0 in | Wt 215.5 lb

## 2012-12-09 DIAGNOSIS — E669 Obesity, unspecified: Secondary | ICD-10-CM | POA: Diagnosis not present

## 2012-12-09 DIAGNOSIS — Z713 Dietary counseling and surveillance: Secondary | ICD-10-CM | POA: Diagnosis not present

## 2012-12-09 NOTE — Progress Notes (Signed)
  Follow-up visit:  13.5 Months Post-Operative RYGB Surgery  Medical Nutrition Therapy:  Appt start time: 0800  End time:  0830.  Primary concerns today: Post-operative Bariatric Surgery Nutrition Management. [name] returns with no additional weight loss and states that some days nothing sits with him and he will have a protein shake instead. This happens anywhere from 1 x week to every other week. States he cannot tell what sets him off. Still feels like he has no interest in food. Not getting protein foods in. States his energy level is good.   Surgery date: 08/11/11 Surgery type: RYGB Start weight @ [facility]: 307.1 lbs  Weight today: 215.5 lbs Weight change: none Total weight lost: 91.8 lbs BMI: 26.9 kg/m^2 Weight goal: ~215-220 lbs; main goal is to reverse diabetes  Checks CGM: Most days Average FBG: 93-125 mg/dL; not testing very often since numbers have been good Recent A1c: 10.5% (05/03/12 Dr. Sharl Ma) - no new A1c at this time   24 hour recall B: raisin toast with butter and coffee and no sugar S: none L:sandwich with banana, vegetables, salad  D: very little, salmon and piece of toasted bread, steak a couple x week, vegetables, salad  Fluid intake: Water, crystal light, protein shakes ("every now and then"), carbonated diet soda mixed with water: ~ 64 oz Estimated total protein intake: ~ 0-20g (CONTINUES TO BE DECREASED)  Medications:  Metformin added, reduced blood pressure medication d/t "dizzy spells"  Supplementation: See above. Does not take others d/t being "allergic" to them. Broke out in a rash as a child and again several years ago.  Using straws: No Drinking while eating: Sips only; helps food go down Hair loss: No Carbonated beverages:  Yes; Watered down diet soda (12 oz/day) N/V/D/C: Nausea continues at times. Reports regurgitation up to 1 x times/week. Cannot tolerate steak, pork chops, and sometimes shakes; cheese, eggs, hotdogs, lean hamburger, baked potatoes, sausage,  salads.  Likely d/t fat content of food and/or reheating proteins with little added moisture, though states he can sometimes tolerate same foods.  Dumping syndrome: None reported  Recent physical activity:  30 min daily on treadmill; walking around house and to neighbors' houses; normal ADLs (mowing grass, etc)  Progress Towards Goal(s):  In progress.   Nutritional Diagnosis:  NI-5.7.1 Inadequate protein intake related to recent RYGB surgery as evidenced by patient consuming <0-25% of recommended goal. - CONTINUE [place]-1.4 Altered GI function related to recent RYGB surgery as evidenced by patient reported vomiting after eating many foods. - CONTINUE  Intervention:  Nutrition education/reinforcement.  Monitoring/Evaluation:  Dietary intake, exercise, and body weight. Follow up in 3 months for 15 month post-op visit.

## 2012-12-09 NOTE — Patient Instructions (Addendum)
Goals:  Follow Phase 3B: High Protein + Non-Starchy Vegetables  Avoid meal skipping!!  Increase lean protein foods to meet 80g goal  Open your Cracker [employer]rel cheese and add to meals  If protein is not tolerated, try adding protein powder to foods  Avoid high fat foods to help alleviate nausea  Avoid drinking 15 minutes before, during and 30 minutes after eating  Contact Dr. Daphine Deutscher if you continue to lose weight or are unable to tolerate enough protein

## 2012-12-15 ENCOUNTER — Other Ambulatory Visit: Payer: Self-pay

## 2012-12-21 DIAGNOSIS — M19019 Primary osteoarthritis, unspecified shoulder: Secondary | ICD-10-CM | POA: Diagnosis not present

## 2012-12-21 DIAGNOSIS — M25519 Pain in unspecified shoulder: Secondary | ICD-10-CM | POA: Diagnosis not present

## 2012-12-29 ENCOUNTER — Encounter (INDEPENDENT_AMBULATORY_CARE_PROVIDER_SITE_OTHER): Payer: Self-pay | Admitting: Surgery

## 2012-12-29 ENCOUNTER — Other Ambulatory Visit: Payer: Self-pay | Admitting: Orthopedic Surgery

## 2012-12-29 ENCOUNTER — Ambulatory Visit (INDEPENDENT_AMBULATORY_CARE_PROVIDER_SITE_OTHER): Payer: Medicare Other | Admitting: Surgery

## 2012-12-29 VITALS — BP 120/80 | HR 80 | Temp 97.0°F | Resp 18 | Ht 75.0 in | Wt 218.6 lb

## 2012-12-29 DIAGNOSIS — M793 Panniculitis, unspecified: Secondary | ICD-10-CM

## 2012-12-29 NOTE — Progress Notes (Signed)
Leane Para 72 y.o.  Body mass index is 27.32 kg/(m^2).  Patient Active Problem List   Diagnosis Date Noted  . Panniculitis 12/29/2012  . Left-sided chest wall pain 05/06/2012  . Peripheral edema 03/29/2012  . Asthma exacerbation 03/29/2012  . Peripheral neuropathy 12/11/2011  . 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 Atrial fibrillation, permanent 08/12/2011  . Bacterial overgrowth syndrome 10/28/2010  . Obesity 10/28/2010  . GERD (gastroesophageal reflux disease) 10/14/2010  . Nonsurgical dumping syndrome 10/14/2010  . Diverticulitis of colon 09/02/2010  . Acute venous embolism and thrombosis of deep vessels of proximal lower extremity 08/01/2010  . OTHER DISEASES OF NASAL CAVITY AND SINUSES 12/24/2009  . OSTEOPOROSIS, DRUG-INDUCED 11/26/2009  . UNSPECIFIED ANEMIA 09/02/2009  . RENAL INSUFFICIENCY 09/02/2009  . ALLERGIC RHINITIS CAUSE UNSPECIFIED 01/17/2009  . ASTHMA, PERSISTENT, MILD 11/13/2008  . FOOT SURGERY, HX OF 12/18/2006  . KNEE REPLACEMENT, LEFT, HX OF 12/18/2006  . HERPES ZOSTER W/NERVOUS COMPLICATION NEC 07/01/2006  . HYPERCHOLESTEROLEMIA 05/19/2006  . ANXIETY 05/19/2006  . DEPRESSION 05/19/2006  . RESTLESS LEG SYNDROME 05/19/2006  . HYPERTENSION 05/19/2006  . ATRIAL FIBRILLATION, PAROXYSMAL 05/19/2006  . GASTROPARESIS 05/19/2006  . DIVERTICULOSIS, COLON 05/19/2006  . IRRITABLE BOWEL SYNDROME 05/19/2006  . LOW BACK PAIN, CHRONIC 05/19/2006  . DM 02/10/1992    Allergies  Allergen Reactions  . Sulfacetamide Sodium     REACTION: Throat swelling  . Oxycodone Other (See Comments)    CONFUSION / HALLUCINATIONS    Past Surgical History  Procedure Laterality Date  . Back surgery      X 4   . Knee surgery      Bilateral x 6  . Artery repair      Left forearm  . Foot surgery      Left foot   . Replacement total knee      Left knee   . Breath tek h pylori  01/08/2011    Procedure: BREATH TEK H PYLORI;  Surgeon: Valarie Merino, MD;   Location: [facility];  Service: General;  Laterality: N/A;  . Eye surgery    . Hand surgery      LEFT  . Leg surgery      FOR NECROTIZING FASCITIS L LEG AND GROIN  . 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 Shoulder arthroscopy     [name], MD 1. Panniculitis     Still bothered a lot by the panniculitis and pinching from the overhanging skin.  The best news is that his DM has resolved.   Will see back in Jan to discuss panniculectomy [name], MD, [facility] [phone] beeper [phone]  12/29/2012 9:31 AM

## 2012-12-29 NOTE — Patient Instructions (Signed)
Continue to treat panniculitis with creams and powders

## 2013-01-04 ENCOUNTER — Ambulatory Visit (INDEPENDENT_AMBULATORY_CARE_PROVIDER_SITE_OTHER): Payer: Medicare Other | Admitting: General Practice

## 2013-01-04 DIAGNOSIS — I824Y9 Acute embolism and thrombosis of unspecified deep veins of unspecified proximal lower extremity: Secondary | ICD-10-CM | POA: Diagnosis not present

## 2013-01-04 DIAGNOSIS — I4891 Unspecified atrial fibrillation: Secondary | ICD-10-CM

## 2013-01-04 DIAGNOSIS — Z7901 Long term (current) use of anticoagulants: Secondary | ICD-10-CM

## 2013-01-04 LAB — POCT INR: INR: 2.2

## 2013-01-06 ENCOUNTER — Other Ambulatory Visit: Payer: Self-pay | Admitting: Family Medicine

## 2013-01-08 NOTE — Telephone Encounter (Signed)
Last office visit 10/25/2012 with Nicki Reaper.   Ok to refill?

## 2013-01-09 ENCOUNTER — Other Ambulatory Visit: Payer: Self-pay | Admitting: Family Medicine

## 2013-01-10 ENCOUNTER — Other Ambulatory Visit: Payer: Self-pay | Admitting: Dermatology

## 2013-01-10 DIAGNOSIS — D0470 Carcinoma in situ of skin of unspecified lower limb, including hip: Secondary | ICD-10-CM | POA: Diagnosis not present

## 2013-01-10 DIAGNOSIS — D045 Carcinoma in situ of skin of trunk: Secondary | ICD-10-CM | POA: Diagnosis not present

## 2013-01-10 DIAGNOSIS — Z85828 Personal history of other malignant neoplasm of skin: Secondary | ICD-10-CM | POA: Diagnosis not present

## 2013-01-10 DIAGNOSIS — D485 Neoplasm of uncertain behavior of skin: Secondary | ICD-10-CM | POA: Diagnosis not present

## 2013-01-10 NOTE — Telephone Encounter (Signed)
Called to [facility] 

## 2013-01-11 ENCOUNTER — Ambulatory Visit: Payer: Medicare Other | Admitting: Physician Assistant

## 2013-01-12 ENCOUNTER — Ambulatory Visit (INDEPENDENT_AMBULATORY_CARE_PROVIDER_SITE_OTHER): Payer: Medicare Other | Admitting: Physician Assistant

## 2013-01-12 ENCOUNTER — Encounter: Payer: Self-pay | Admitting: Physician Assistant

## 2013-01-12 VITALS — BP 121/81 | HR 92 | Ht 75.0 in | Wt 214.0 lb

## 2013-01-12 DIAGNOSIS — I10 Essential (primary) hypertension: Secondary | ICD-10-CM | POA: Diagnosis not present

## 2013-01-12 DIAGNOSIS — I4891 Unspecified atrial fibrillation: Secondary | ICD-10-CM

## 2013-01-12 DIAGNOSIS — Z01810 Encounter for preprocedural cardiovascular examination: Secondary | ICD-10-CM | POA: Diagnosis not present

## 2013-01-12 DIAGNOSIS — E7800 Pure hypercholesterolemia, unspecified: Secondary | ICD-10-CM | POA: Diagnosis not present

## 2013-01-12 NOTE — Patient Instructions (Signed)
NO CHANGES WERE MADE TODAY  WE WILL SEE YOU AROUND 11-2013 WITH DR. HOCHREIN

## 2013-01-12 NOTE — Progress Notes (Signed)
[address] [place], [place]  [address] Phone: [phone] Fax:  [phone]  Date:  01/12/2013   ID:  [name], DOB 02/28/1940, MRN [id number]  PCP:  [name], MD  Cardiologist:  Dr. Rollene Rotunda     History of Present Illness: [name] is a 72 y.o. male with a hx of AFib, HTN, HL, T2DM, OSA, obesity s/p gastric bypass surgery, prior pulmonary embolism, chronic coumadin Rx.  Echo (08/2011):  Mild LVH, EF 55-60%, mild to mod LAE.  Last seen by Dr. Rollene Rotunda 11/2012.  BP was low and his medications were adjusted.  He needs L shoulder replacement with Dr. Ave Filter 12/18.  He is doing well. The patient denies chest pain, shortness of breath, syncope, orthopnea, PND or significant pedal edema. He has lost 100 lbs since his gastric bypass.  He can achieve > 4 METs without symptoms to suggest angina.  Recent Labs: 03/07/2012: ALT 32; Creatinine 1.4; Direct LDL 147.0; HDL 36.50*; Hemoglobin 14.8; Potassium 4.3   Wt Readings from Last 3 Encounters:  01/12/13 214 lb (97.07 kg)  12/29/12 218 lb 9.6 oz (99.156 kg)  12/09/12 215 lb 8 oz (97.75 kg)     Past Medical History  Diagnosis Date  . Diverticulosis of colon (without mention of hemorrhage)   . Other pulmonary embolism and infarction   . Restless legs syndrome (RLS)   . Obesity, unspecified   . Irritable bowel syndrome   . Gastroparesis   . Unspecified essential hypertension   . Lumbago   . Depressive disorder, not elsewhere classified   . Anxiety state, unspecified   . Unspecified asthma(493.90)   . Esophageal reflux   . Atrial fibrillation   . Pure hypercholesterolemia   . Type II or unspecified type diabetes mellitus without mention of complication, not stated as uncontrolled   . Hemorrhoids   . Back pain, chronic   . Hx of gout   . Bruises easily   . Cancer     SKIN CANCER REMOVED  . Sleep apnea     STOP BANG SCORE 6    Current Outpatient Prescriptions  Medication Sig Dispense  Refill  . atorvastatin (LIPITOR) 80 MG tablet Take 1 tablet by mouth   daily  90 tablet  3  . FLUoxetine (PROZAC) 20 MG capsule TAKE ONE CAPSULE BY MOUTH EVERY DAY  30 capsule  2  . furosemide (LASIX) 20 MG tablet Take 1- to  tablets (20-40  mg total) by mouth daily.  135 tablet  3  . lisinopril (PRINIVIL,ZESTRIL) 5 MG tablet Take 0.5 tablets (2.5 mg total) by mouth daily.  30 tablet  11  . metFORMIN (GLUCOPHAGE) 500 MG tablet Take 500 mg by mouth 2 (two) times daily with a meal.       . metoprolol succinate (TOPROL-XL) 25 MG 24 hr tablet Take 1 tablet by mouth  twice a day  90 tablet  3  . omeprazole (PRILOSEC) 40 MG capsule Tae 1 capsule by mouth two  times daily  180 capsule  3  . potassium chloride (KLOR-CON) 20 MEQ packet Take 20 mEq by mouth as needed (when taking 2 tab lasix).      [name] RAPAFLO 8 MG CAPS capsule Take 8 mg by mouth daily with breakfast.       . triamcinolone cream (KENALOG) 0.1 %       . warfarin (COUMADIN) 2.5 MG tablet Take one tablet by mouth daily as directed  90 tablet  3  . zolpidem (AMBIEN) 5 MG tablet TAKE 1-2 TABLETS BY MOUTH AT BEDTIME AS NEEDED  30 tablet  0   No current facility-administered medications for this visit.    Allergies:   Sulfacetamide sodium and Oxycodone   Social History:  The patient  reports that he has never smoked. He has never used smokeless tobacco. He reports that he does not drink alcohol or use illicit drugs.   Family History:  The patient's family history includes Alzheimer's disease in his sister; Breast cancer in his mother; Cancer in his mother; Colon cancer (age of onset: 77) in his brother; Emphysema in his father; Heart attack in his brother; Prostate cancer in his brother; Uterine cancer in his mother.   ROS:  Please see the history of present illness.   All other systems reviewed and negative.   PHYSICAL EXAM: VS:  BP 121/81  Pulse 92  Ht 6\' 3"  (1.905 m)  Wt 214 lb (97.07 kg)  BMI 26.75 kg/m2 Well nourished, well  developed, in no acute distress HEENT: normal Neck: no JVD Cardiac:  normal S1, S2; irreg irreg; no murmur Lungs:  clear to auscultation bilaterally, no wheezing, rhonchi or rales Abd: soft, nontender, no hepatomegaly Ext: no edema Skin: warm and dry Neuro:  CNs 2-12 intact, no focal abnormalities noted  EKG:  AFib, HR 92, NSSTTW changes     ASSESSMENT AND PLAN:  1. Surgical Clearance:  The patient does not have any unstable cardiac conditions.  He can achieve 4 METs or greater without anginal symptoms.  According to [facility] and AHA guidelines, he requires no further cardiac workup prior to his noncardiac surgery.  The patient should be at acceptable risk.  He has never had a CVA.  It is acceptable for him to hold coumadin 5 days prior to surgery and resume post op when able.  Our service is available as necessary in the perioperative period.  2. Atrial Fibrillation:  HR controlled.  Continue coumadin. I advised him to take his metoprolol the day of his surgery. 3. Hypertension:  Controlled. He is now off Lisinopril.  4. Hyperlipidemia:  Continue statin.  5. Obesity:  I have congratulated him on losing 100 lbs since his surgery.  6. Disposition:  F/u with Dr. Rollene Rotunda as planned.   Signed, Tereso Newcomer, PA-C  01/12/2013 11:52 AM

## 2013-01-13 ENCOUNTER — Ambulatory Visit (INDEPENDENT_AMBULATORY_CARE_PROVIDER_SITE_OTHER): Payer: Medicare Other | Admitting: Family Medicine

## 2013-01-13 ENCOUNTER — Encounter: Payer: Self-pay | Admitting: Family Medicine

## 2013-01-13 ENCOUNTER — Telehealth: Payer: Self-pay | Admitting: Physician Assistant

## 2013-01-13 VITALS — BP 100/72 | HR 81 | Temp 97.5°F | Ht 75.0 in | Wt 216.5 lb

## 2013-01-13 DIAGNOSIS — Z01818 Encounter for other preprocedural examination: Secondary | ICD-10-CM

## 2013-01-13 DIAGNOSIS — I4821 Permanent atrial fibrillation: Secondary | ICD-10-CM

## 2013-01-13 DIAGNOSIS — I10 Essential (primary) hypertension: Secondary | ICD-10-CM

## 2013-01-13 DIAGNOSIS — N259 Disorder resulting from impaired renal tubular function, unspecified: Secondary | ICD-10-CM | POA: Diagnosis not present

## 2013-01-13 DIAGNOSIS — J45909 Unspecified asthma, uncomplicated: Secondary | ICD-10-CM

## 2013-01-13 DIAGNOSIS — D649 Anemia, unspecified: Secondary | ICD-10-CM

## 2013-01-13 DIAGNOSIS — E119 Type 2 diabetes mellitus without complications: Secondary | ICD-10-CM

## 2013-01-13 DIAGNOSIS — I4891 Unspecified atrial fibrillation: Secondary | ICD-10-CM

## 2013-01-13 MED ORDER — ZOLPIDEM TARTRATE 5 MG PO TABS: ORAL_TABLET | ORAL | Status: DC

## 2013-01-13 MED ORDER — METFORMIN HCL 500 MG PO TABS: 500.0000 mg | ORAL_TABLET | Freq: Two times a day (BID) | ORAL | Status: DC

## 2013-01-13 MED ORDER — FUROSEMIDE 20 MG PO TABS: ORAL_TABLET | ORAL | Status: DC

## 2013-01-13 MED ORDER — METOPROLOL SUCCINATE ER 25 MG PO TB24: ORAL_TABLET | ORAL | Status: DC

## 2013-01-13 MED ORDER — SILODOSIN 8 MG PO CAPS: 8.0000 mg | ORAL_CAPSULE | Freq: Every day | ORAL | Status: DC

## 2013-01-13 MED ORDER — FLUOXETINE HCL 20 MG PO CAPS: ORAL_CAPSULE | ORAL | Status: DC

## 2013-01-13 MED ORDER — ATORVASTATIN CALCIUM 80 MG PO TABS: ORAL_TABLET | ORAL | Status: DC

## 2013-01-13 MED ORDER — WARFARIN SODIUM 2.5 MG PO TABS: ORAL_TABLET | ORAL | Status: DC

## 2013-01-13 MED ORDER — OMEPRAZOLE 40 MG PO CPDR: DELAYED_RELEASE_CAPSULE | ORAL | Status: DC

## 2013-01-13 NOTE — Telephone Encounter (Signed)
Received request from Nurse fax box, documents faxed for surgical clearance. To: [facility] Fax number: [phone] Attention: 01/13/13/km

## 2013-01-13 NOTE — Progress Notes (Signed)
Subjective:    Patient ID: [name], male    DOB: 07-02-1940, 72 y.o.   MRN: [id number]  HPI  72 year old male with history of Afib, COPD, asthma, renal insufficiency, DM presents for pre-op clearance. Left shoulder replacement... Scheduled on 12/18.   DM.[name] Well controlled per ENDo 98-130 fasting and nonfasting.  Saw cardiology ( Dr. Antoine Poche) on 12/4: Surgical Clearance: The patient does not have any unstable cardiac conditions. He can achieve 4 METs or greater without anginal symptoms. According to [facility] and AHA guidelines, he requires no further cardiac workup prior to his noncardiac surgery. The patient should be at acceptable risk. He has never had a CVA. It is acceptable for him to hold coumadin 5 days prior to surgery and resume post op when able. Our service is available as necessary in the perioperative period.  Nephrology eval 2 months ago, stable renal function.   COPD stable,  No recent exacerbations.  HTN, well controlled.   Review of Systems  Constitutional: Negative for fever, fatigue and unexpected weight change.  HENT: Negative for congestion, ear pain, postnasal drip, rhinorrhea, sore throat and trouble swallowing.   Eyes: Negative for pain.  Respiratory: Negative for cough, shortness of breath and wheezing.   Cardiovascular: Negative for chest pain, palpitations and leg swelling.  Gastrointestinal: Negative for nausea, abdominal pain, diarrhea, constipation and blood in stool.  Genitourinary: Negative for dysuria, urgency, hematuria, discharge, penile swelling, scrotal swelling, difficulty urinating, penile pain and testicular pain.  Skin: Negative for rash.  Neurological: Negative for syncope, weakness, light-headedness, numbness and headaches.  Psychiatric/Behavioral: Negative for behavioral problems and dysphoric mood. The patient is not nervous/anxious.        Objective:   Physical Exam  Constitutional: He appears well-developed and well-nourished.   Non-toxic appearance. He does not appear ill. No distress.  HENT:  Head: Normocephalic and atraumatic.  Right Ear: Hearing, tympanic membrane, external ear and ear canal normal.  Left Ear: Hearing, tympanic membrane, external ear and ear canal normal.  Nose: Nose normal.  Mouth/Throat: Uvula is midline, oropharynx is clear and moist and mucous membranes are normal.  Eyes: Conjunctivae, EOM and lids are normal. Pupils are equal, round, and reactive to light. Lids are everted and swept, no foreign bodies found.  Neck: Trachea normal, normal range of motion and phonation normal. Neck supple. Carotid bruit is not present. No mass and no thyromegaly present.  Cardiovascular: Normal rate, regular rhythm, S1 normal, S2 normal, intact distal pulses and normal pulses.  Exam reveals no gallop.   No murmur heard. Pulmonary/Chest: Breath sounds normal. He has no wheezes. He has no rhonchi. He has no rales.  Abdominal: Soft. Normal appearance and bowel sounds are normal. There is no hepatosplenomegaly. There is no tenderness. There is no rebound, no guarding and no CVA tenderness. No hernia. Hernia confirmed negative in the right inguinal area and confirmed negative in the left inguinal area.  Genitourinary: Prostate normal, testes normal and penis normal. Rectal exam shows no external hemorrhoid, no internal hemorrhoid, no fissure, no mass, no tenderness and anal tone normal. Guaiac negative stool. Prostate is not enlarged and not tender. Right testis shows no mass and no tenderness. Left testis shows no mass and no tenderness. No paraphimosis or penile tenderness.  Lymphadenopathy:    He has no cervical adenopathy.       Right: No inguinal adenopathy present.       Left: No inguinal adenopathy present.  Neurological: He is alert. He has  normal strength and normal reflexes. No cranial nerve deficit or sensory deficit. Gait normal.  Skin: Skin is warm, dry and intact. No rash noted.  Psychiatric: He has a  normal mood and affect. His speech is normal and behavior is normal. Judgment normal.          Assessment & Plan:  Pre-op eval.  All chronic issues stable. Has cardiac clearance per cardiology. Other than lab eval and CXR to be performed with routine testing... No other testing needed prior to surgery for clearance. Clear to proceed with surgery.

## 2013-01-13 NOTE — Progress Notes (Signed)
Pre-visit discussion using our clinic review tool. No additional management support is needed unless otherwise documented below in the visit note.  

## 2013-01-13 NOTE — Patient Instructions (Signed)
Schedule medicare wellness prevention visit in next 3 months, no labs needed prior.

## 2013-01-16 ENCOUNTER — Encounter (HOSPITAL_COMMUNITY): Payer: Self-pay | Admitting: Pharmacy Technician

## 2013-01-17 NOTE — Assessment & Plan Note (Signed)
Well controlled. Continue current medication.  

## 2013-01-17 NOTE — Assessment & Plan Note (Signed)
Well controlled followed by ENDO. Continue current medication.

## 2013-01-17 NOTE — Assessment & Plan Note (Signed)
Previously stable... Needs re-eval prior to surgery.. Will be done with standard pre-op testing.

## 2013-01-17 NOTE — Assessment & Plan Note (Signed)
CBC to be checked prior to surgery with pre-op testing.

## 2013-01-17 NOTE — Assessment & Plan Note (Signed)
Clear by Cardiology as well this week. Reviewed note in detail.

## 2013-01-17 NOTE — Assessment & Plan Note (Signed)
Stable , no respiratory concerns at this time. Recommend IS after surgery.

## 2013-01-18 NOTE — Pre-Procedure Instructions (Signed)
Danil Wedge  01/18/2013   Your procedure is scheduled on:  Thurs, Dec 18 @ 12:00 PM  Report to [facility] at 10:00 AM.  Call this number if you have problems the morning of surgery: [phone]   Remember:   Do not eat food or drink liquids after midnight.   Take these medicines the morning of surgery with A SIP OF WATER: Fluoxetine(Prozac),Fluticasone(Advair)<Bring Your Inhaler With You>,Omeprazole(Prilosec),and Rapaflo(Silodosin)               As instructed by your Cardiologist stop your Coumadin. No Goody's,BC's,Aleve,Aspirin,Ibuprofen,Fish Oil,or any Herbal Medications   Do not wear jewelry  Do not wear lotions, powders, or colognes. You may wear deodorant.  Men may shave face and neck.  Do not bring valuables to the hospital.  [facility] is not responsible                  for any belongings or valuables.               Contacts, dentures or bridgework may not be worn into surgery.  Leave suitcase in the car. After surgery it may be brought to your room.  For patients admitted to the hospital, discharge time is determined by your                treatment team.                   Special Instructions: Shower using CHG 2 nights before surgery and the night before surgery.  If you shower the day of surgery use CHG.  Use special wash - you have one bottle of CHG for all showers.  You should use approximately 1/3 of the bottle for each shower.   Please read over the following fact sheets that you were given: Pain Booklet, Coughing and Deep Breathing, Blood Transfusion Information, Total Joint Packet and MRSA Information

## 2013-01-19 ENCOUNTER — Encounter (HOSPITAL_COMMUNITY): Payer: Self-pay

## 2013-01-19 ENCOUNTER — Encounter (HOSPITAL_COMMUNITY)
Admission: RE | Admit: 2013-01-19 | Discharge: 2013-01-19 | Disposition: A | Discharge status: Home / Self Care | Payer: Medicare Other | Source: Ambulatory Visit | Attending: Orthopedic Surgery | Admitting: Orthopedic Surgery

## 2013-01-19 ENCOUNTER — Telehealth: Payer: Self-pay | Admitting: Family Medicine

## 2013-01-19 DIAGNOSIS — Z01812 Encounter for preprocedural laboratory examination: Secondary | ICD-10-CM | POA: Diagnosis not present

## 2013-01-19 DIAGNOSIS — Z01818 Encounter for other preprocedural examination: Secondary | ICD-10-CM | POA: Insufficient documentation

## 2013-01-19 HISTORY — DX: Personal history of colon polyps, unspecified: Z86.0100

## 2013-01-19 HISTORY — DX: Personal history of urinary calculi: Z87.442

## 2013-01-19 HISTORY — DX: Personal history of other venous thrombosis and embolism: Z86.718

## 2013-01-19 HISTORY — DX: Unspecified hearing loss, unspecified ear: H91.90

## 2013-01-19 HISTORY — DX: Unspecified osteoarthritis, unspecified site: M19.90

## 2013-01-19 HISTORY — DX: Personal history of colonic polyps: Z86.010

## 2013-01-19 HISTORY — DX: Pneumonia, unspecified organism: J18.9

## 2013-01-19 LAB — CBC WITH DIFFERENTIAL/PLATELET
Basophils Absolute: 0.1 10*3/uL (ref 0.0–0.1)
Basophils Relative: 1 % (ref 0–1)
Eosinophils Absolute: 0.1 10*3/uL (ref 0.0–0.7)
Eosinophils Relative: 2 % (ref 0–5)
HCT: 41.4 % (ref 39.0–52.0)
Hemoglobin: 14 g/dL (ref 13.0–17.0)
Lymphs Abs: 1.8 10*3/uL (ref 0.7–4.0)
MCH: 31.7 pg (ref 26.0–34.0)
MCHC: 33.8 g/dL (ref 30.0–36.0)
Monocytes Absolute: 0.9 10*3/uL (ref 0.1–1.0)
Neutro Abs: 5.5 10*3/uL (ref 1.7–7.7)
RBC: 4.41 MIL/uL (ref 4.22–5.81)
RDW: 13.6 % (ref 11.5–15.5)

## 2013-01-19 LAB — COMPREHENSIVE METABOLIC PANEL
AST: 23 U/L (ref 0–37)
Albumin: 4.2 g/dL (ref 3.5–5.2)
Alkaline Phosphatase: 72 U/L (ref 39–117)
Calcium: 8.9 mg/dL (ref 8.4–10.5)
Chloride: 106 mEq/L (ref 96–112)
Creatinine, Ser: 1.21 mg/dL (ref 0.50–1.35)
GFR calc Af Amer: 68 mL/min — ABNORMAL LOW (ref >90)
GFR calc non Af Amer: 58 mL/min — ABNORMAL LOW (ref >90)
Potassium: 4.1 mEq/L (ref 3.5–5.1)
Total Protein: 7.2 g/dL (ref 6.0–8.3)

## 2013-01-19 LAB — PROTIME-INR
INR: 2.32 — ABNORMAL HIGH (ref 0.00–1.49)
Prothrombin Time: 24.7 seconds — ABNORMAL HIGH (ref 11.6–15.2)

## 2013-01-19 LAB — URINALYSIS, ROUTINE W REFLEX MICROSCOPIC
Bilirubin Urine: NEGATIVE
Ketones, ur: NEGATIVE mg/dL
Nitrite: NEGATIVE
Protein, ur: NEGATIVE mg/dL
Urobilinogen, UA: 0.2 mg/dL (ref 0.0–1.0)

## 2013-01-19 LAB — APTT: aPTT: 39 seconds — ABNORMAL HIGH (ref 24–37)

## 2013-01-19 LAB — TYPE AND SCREEN
ABO/RH(D): O POS
Antibody Screen: NEGATIVE

## 2013-01-19 MED ORDER — FLUOXETINE HCL 20 MG PO CAPS: 20.0000 mg | ORAL_CAPSULE | Freq: Every day | ORAL | Status: DC

## 2013-01-19 MED ORDER — ATORVASTATIN CALCIUM 80 MG PO TABS: 80.0000 mg | ORAL_TABLET | Freq: Every day | ORAL | Status: DC

## 2013-01-19 MED ORDER — POVIDONE-IODINE 7.5 % EX SOLN: Freq: Once | CUTANEOUS | Status: DC

## 2013-01-19 MED ORDER — METFORMIN HCL 500 MG PO TABS: 500.0000 mg | ORAL_TABLET | Freq: Two times a day (BID) | ORAL | Status: DC

## 2013-01-19 MED ORDER — OMEPRAZOLE 40 MG PO CPDR: 40.0000 mg | DELAYED_RELEASE_CAPSULE | Freq: Every day | ORAL | Status: DC

## 2013-01-19 MED ORDER — FUROSEMIDE 20 MG PO TABS: ORAL_TABLET | ORAL | Status: DC

## 2013-01-19 NOTE — Progress Notes (Signed)
Dr.Hochrein is Cardiologist with clearance note in chart and epic   Echo reports in epic from 2010 and 2013  Heart cath done >70yrs ago  Stress test done > 16yrs ago  EKG in epic from 01-12-13  CXR in epic from 05-06-12  Sleep study in epic from 2012 and 2013-sleep study neg  Medical Md is Dr. [name]

## 2013-01-19 NOTE — Telephone Encounter (Signed)
Pt left voicemail at Triage. Needs refills sent to [facility] not local pharmacy, pt would like a call back once done

## 2013-01-19 NOTE — Telephone Encounter (Signed)
Mr. Kropf notified prescriptions have been sent to [facility].

## 2013-01-19 NOTE — Progress Notes (Signed)
Anesthesia Chart Review:  Patient is a 72 year old male scheduled for left total shoulder on 01/26/13 by Dr. Ave Filter. History includes non-smoker, PE, RLS, gastroparesis, hypercholesterolemia, anxiety, GERD, afib, DM2, HTN, depression, asthma, hard of hearing, nephrolithiasis, gastric bypass. PCP is Dr. [name] who cleared patient for this procedure.  Cardiologist is Dr. Antoine Poche.  He was seen by Tereso Newcomer, PA-C on 01/12/13 who also cleared patient for surgery from a cardiology standpoint with permission to hold Coumadin 5 days.    EKG on 01/12/13 showed afib @ 92 bpm, possible inferior infarct (age undetermined), non-specific ST/T wave changes. HR was 83 bpm today.  Echo on 08/11/11 showed:  - Left ventricle: The cavity size was normal. Wall thickness was increased in a pattern of mild LVH. Systolic function was normal. The estimated ejection fraction was in the range of 55% to 60%. Wall motion was normal; there were no regional wall motion abnormalities. - Left atrium: The atrium was mildly to moderately dilated.  Last cardiac cath was ~ 2000.  CXR on 05/06/12 showed no active disease, no significant change.  Preoperative labs noted.  Recheck PT/PTT on the day of surgery.    If no acute changes and follow-up labs are reasonable then I would anticipate that he could proceed as planned.  Velna Ochs [facility]/Anesthesiology Phone [phone] 01/19/2013 3:24 PM

## 2013-01-25 MED ORDER — CEFAZOLIN SODIUM-DEXTROSE 2-3 GM-% IV SOLR
2.0000 g | INTRAVENOUS | Status: DC
  Filled 2013-01-25: qty 50

## 2013-01-26 ENCOUNTER — Inpatient Hospital Stay (HOSPITAL_COMMUNITY)
Admission: RE | Admit: 2013-01-26 | Discharge: 2013-01-27 | DRG: 483 | Disposition: A | Discharge status: Home / Self Care | Payer: Medicare Other | Source: Ambulatory Visit | Attending: Orthopedic Surgery | Admitting: Orthopedic Surgery

## 2013-01-26 ENCOUNTER — Inpatient Hospital Stay (HOSPITAL_COMMUNITY): Payer: Medicare Other | Admitting: Anesthesiology

## 2013-01-26 ENCOUNTER — Inpatient Hospital Stay (HOSPITAL_COMMUNITY): Payer: Medicare Other

## 2013-01-26 ENCOUNTER — Encounter (HOSPITAL_COMMUNITY)
Admission: RE | Disposition: A | Discharge status: Home / Self Care | Payer: Self-pay | Source: Ambulatory Visit | Attending: Orthopedic Surgery

## 2013-01-26 ENCOUNTER — Encounter (HOSPITAL_COMMUNITY): Payer: Medicare Other | Admitting: Vascular Surgery

## 2013-01-26 ENCOUNTER — Encounter (HOSPITAL_COMMUNITY): Payer: Self-pay | Admitting: *Deleted

## 2013-01-26 DIAGNOSIS — E119 Type 2 diabetes mellitus without complications: Secondary | ICD-10-CM | POA: Diagnosis present

## 2013-01-26 DIAGNOSIS — M9420 Chondromalacia, unspecified site: Secondary | ICD-10-CM | POA: Diagnosis present

## 2013-01-26 DIAGNOSIS — Z803 Family history of malignant neoplasm of breast: Secondary | ICD-10-CM | POA: Diagnosis not present

## 2013-01-26 DIAGNOSIS — F411 Generalized anxiety disorder: Secondary | ICD-10-CM | POA: Diagnosis present

## 2013-01-26 DIAGNOSIS — K3184 Gastroparesis: Secondary | ICD-10-CM | POA: Diagnosis not present

## 2013-01-26 DIAGNOSIS — Z7901 Long term (current) use of anticoagulants: Secondary | ICD-10-CM

## 2013-01-26 DIAGNOSIS — H9190 Unspecified hearing loss, unspecified ear: Secondary | ICD-10-CM | POA: Diagnosis present

## 2013-01-26 DIAGNOSIS — Z800 Family history of malignant neoplasm of digestive organs: Secondary | ICD-10-CM | POA: Diagnosis not present

## 2013-01-26 DIAGNOSIS — I739 Peripheral vascular disease, unspecified: Secondary | ICD-10-CM | POA: Diagnosis present

## 2013-01-26 DIAGNOSIS — Z8042 Family history of malignant neoplasm of prostate: Secondary | ICD-10-CM | POA: Diagnosis not present

## 2013-01-26 DIAGNOSIS — F329 Major depressive disorder, single episode, unspecified: Secondary | ICD-10-CM | POA: Diagnosis present

## 2013-01-26 DIAGNOSIS — Z86711 Personal history of pulmonary embolism: Secondary | ICD-10-CM | POA: Diagnosis not present

## 2013-01-26 DIAGNOSIS — I10 Essential (primary) hypertension: Secondary | ICD-10-CM | POA: Diagnosis present

## 2013-01-26 DIAGNOSIS — G2581 Restless legs syndrome: Secondary | ICD-10-CM | POA: Diagnosis present

## 2013-01-26 DIAGNOSIS — Z96659 Presence of unspecified artificial knee joint: Secondary | ICD-10-CM

## 2013-01-26 DIAGNOSIS — G4700 Insomnia, unspecified: Secondary | ICD-10-CM | POA: Diagnosis present

## 2013-01-26 DIAGNOSIS — Z85828 Personal history of other malignant neoplasm of skin: Secondary | ICD-10-CM

## 2013-01-26 DIAGNOSIS — K219 Gastro-esophageal reflux disease without esophagitis: Secondary | ICD-10-CM | POA: Diagnosis present

## 2013-01-26 DIAGNOSIS — Z8249 Family history of ischemic heart disease and other diseases of the circulatory system: Secondary | ICD-10-CM

## 2013-01-26 DIAGNOSIS — D649 Anemia, unspecified: Secondary | ICD-10-CM | POA: Diagnosis not present

## 2013-01-26 DIAGNOSIS — M25519 Pain in unspecified shoulder: Secondary | ICD-10-CM | POA: Diagnosis not present

## 2013-01-26 DIAGNOSIS — Z79899 Other long term (current) drug therapy: Secondary | ICD-10-CM

## 2013-01-26 DIAGNOSIS — E7800 Pure hypercholesterolemia, unspecified: Secondary | ICD-10-CM | POA: Diagnosis present

## 2013-01-26 DIAGNOSIS — Z471 Aftercare following joint replacement surgery: Secondary | ICD-10-CM | POA: Diagnosis not present

## 2013-01-26 DIAGNOSIS — Z820 Family history of epilepsy and other diseases of the nervous system: Secondary | ICD-10-CM

## 2013-01-26 DIAGNOSIS — G8918 Other acute postprocedural pain: Secondary | ICD-10-CM | POA: Diagnosis not present

## 2013-01-26 DIAGNOSIS — M19019 Primary osteoarthritis, unspecified shoulder: Secondary | ICD-10-CM | POA: Diagnosis not present

## 2013-01-26 DIAGNOSIS — I4891 Unspecified atrial fibrillation: Secondary | ICD-10-CM | POA: Diagnosis not present

## 2013-01-26 DIAGNOSIS — F3289 Other specified depressive episodes: Secondary | ICD-10-CM | POA: Diagnosis present

## 2013-01-26 DIAGNOSIS — M549 Dorsalgia, unspecified: Secondary | ICD-10-CM | POA: Diagnosis not present

## 2013-01-26 DIAGNOSIS — Z889 Allergy status to unspecified drugs, medicaments and biological substances status: Secondary | ICD-10-CM

## 2013-01-26 DIAGNOSIS — E669 Obesity, unspecified: Secondary | ICD-10-CM | POA: Diagnosis present

## 2013-01-26 DIAGNOSIS — Z96619 Presence of unspecified artificial shoulder joint: Secondary | ICD-10-CM | POA: Diagnosis not present

## 2013-01-26 DIAGNOSIS — Z836 Family history of other diseases of the respiratory system: Secondary | ICD-10-CM

## 2013-01-26 DIAGNOSIS — K5730 Diverticulosis of large intestine without perforation or abscess without bleeding: Secondary | ICD-10-CM | POA: Diagnosis not present

## 2013-01-26 HISTORY — PX: TOTAL SHOULDER ARTHROPLASTY: SHX126

## 2013-01-26 LAB — PROTIME-INR
INR: 1.14 (ref 0.00–1.49)
Prothrombin Time: 14.4 seconds (ref 11.6–15.2)

## 2013-01-26 LAB — GLUCOSE, CAPILLARY
Glucose-Capillary: 138 mg/dL — ABNORMAL HIGH (ref 70–99)
Glucose-Capillary: 144 mg/dL — ABNORMAL HIGH (ref 70–99)
Glucose-Capillary: 136 mg/dL — ABNORMAL HIGH (ref 70–99)
Glucose-Capillary: 174 mg/dL — ABNORMAL HIGH (ref 70–99)

## 2013-01-26 LAB — APTT: aPTT: 32 seconds (ref 24–37)

## 2013-01-26 SURGERY — ARTHROPLASTY, SHOULDER, TOTAL
Anesthesia: Regional | Site: Shoulder | Laterality: Left

## 2013-01-26 MED ORDER — MORPHINE SULFATE 2 MG/ML IJ SOLN: 1.0000 mg | INTRAMUSCULAR | Status: DC | PRN

## 2013-01-26 MED ORDER — ZOLPIDEM TARTRATE 5 MG PO TABS: 5.0000 mg | ORAL_TABLET | Freq: Every evening | ORAL | Status: DC | PRN

## 2013-01-26 MED ORDER — MIDAZOLAM HCL 2 MG/2ML IJ SOLN
1.0000 mg | Freq: Once | INTRAMUSCULAR | Status: AC
  Administered 2013-01-26: 1 mg via INTRAVENOUS

## 2013-01-26 MED ORDER — ROCURONIUM BROMIDE 100 MG/10ML IV SOLN
INTRAVENOUS | Status: DC | PRN
  Administered 2013-01-26: 50 mg via INTRAVENOUS
  Administered 2013-01-26 (×2): 10 mg via INTRAVENOUS
  Administered 2013-01-26: 20 mg via INTRAVENOUS

## 2013-01-26 MED ORDER — OXYCODONE-ACETAMINOPHEN 5-325 MG PO TABS: 1.0000 | ORAL_TABLET | ORAL | Status: DC | PRN

## 2013-01-26 MED ORDER — DIPHENHYDRAMINE HCL 12.5 MG/5ML PO ELIX
12.5000 mg | ORAL_SOLUTION | ORAL | Status: DC | PRN
  Administered 2013-01-26: 25 mg via ORAL
  Filled 2013-01-26: qty 10

## 2013-01-26 MED ORDER — HEMOSTATIC AGENTS (NO CHARGE) OPTIME
TOPICAL | Status: DC | PRN
  Administered 2013-01-26: 1 via TOPICAL

## 2013-01-26 MED ORDER — DOCUSATE SODIUM 100 MG PO CAPS: 100.0000 mg | ORAL_CAPSULE | Freq: Three times a day (TID) | ORAL | Status: DC | PRN

## 2013-01-26 MED ORDER — WARFARIN SODIUM 2.5 MG PO TABS: 2.5000 mg | ORAL_TABLET | Freq: Every day | ORAL | Status: DC

## 2013-01-26 MED ORDER — METOCLOPRAMIDE HCL 5 MG/ML IJ SOLN: 5.0000 mg | Freq: Three times a day (TID) | INTRAMUSCULAR | Status: DC | PRN

## 2013-01-26 MED ORDER — FENTANYL CITRATE 0.05 MG/ML IJ SOLN
INTRAMUSCULAR | Status: AC
  Filled 2013-01-26: qty 2

## 2013-01-26 MED ORDER — ALUMINUM HYDROXIDE GEL 320 MG/5ML PO SUSP
15.0000 mL | ORAL | Status: DC | PRN
  Filled 2013-01-26: qty 30

## 2013-01-26 MED ORDER — PHENYLEPHRINE HCL 10 MG/ML IJ SOLN
10.0000 mg | INTRAVENOUS | Status: DC | PRN
  Administered 2013-01-26: 10 ug/min via INTRAVENOUS

## 2013-01-26 MED ORDER — ONDANSETRON HCL 4 MG/2ML IJ SOLN
INTRAMUSCULAR | Status: DC | PRN
  Administered 2013-01-26: 4 mg via INTRAVENOUS

## 2013-01-26 MED ORDER — METOCLOPRAMIDE HCL 5 MG PO TABS
5.0000 mg | ORAL_TABLET | Freq: Three times a day (TID) | ORAL | Status: DC | PRN
  Filled 2013-01-26: qty 2

## 2013-01-26 MED ORDER — MENTHOL 3 MG MT LOZG: 1.0000 | LOZENGE | OROMUCOSAL | Status: DC | PRN

## 2013-01-26 MED ORDER — ARTIFICIAL TEARS OP OINT
TOPICAL_OINTMENT | OPHTHALMIC | Status: DC | PRN
  Administered 2013-01-26: 1 via OPHTHALMIC

## 2013-01-26 MED ORDER — DOCUSATE SODIUM 100 MG PO CAPS
100.0000 mg | ORAL_CAPSULE | Freq: Two times a day (BID) | ORAL | Status: DC
  Administered 2013-01-26 – 2013-01-27 (×2): 100 mg via ORAL
  Filled 2013-01-26 (×3): qty 1

## 2013-01-26 MED ORDER — PHENYLEPHRINE HCL 10 MG/ML IJ SOLN
INTRAMUSCULAR | Status: DC | PRN
  Administered 2013-01-26 (×3): 40 ug via INTRAVENOUS

## 2013-01-26 MED ORDER — FLUOXETINE HCL 20 MG PO CAPS
20.0000 mg | ORAL_CAPSULE | Freq: Every day | ORAL | Status: DC
  Administered 2013-01-26 – 2013-01-27 (×2): 20 mg via ORAL
  Filled 2013-01-26 (×2): qty 1

## 2013-01-26 MED ORDER — OXYCODONE-ACETAMINOPHEN 5-325 MG PO TABS
1.0000 | ORAL_TABLET | ORAL | Status: DC | PRN
  Administered 2013-01-27 (×3): 2 via ORAL
  Filled 2013-01-26 (×3): qty 2

## 2013-01-26 MED ORDER — ATORVASTATIN CALCIUM 80 MG PO TABS
80.0000 mg | ORAL_TABLET | Freq: Every day | ORAL | Status: DC
  Administered 2013-01-26: 80 mg via ORAL
  Filled 2013-01-26 (×2): qty 1

## 2013-01-26 MED ORDER — ONDANSETRON HCL 4 MG/2ML IJ SOLN: 4.0000 mg | Freq: Four times a day (QID) | INTRAMUSCULAR | Status: DC | PRN

## 2013-01-26 MED ORDER — ONDANSETRON HCL 4 MG PO TABS: 4.0000 mg | ORAL_TABLET | Freq: Four times a day (QID) | ORAL | Status: DC | PRN

## 2013-01-26 MED ORDER — PROPOFOL 10 MG/ML IV BOLUS
INTRAVENOUS | Status: DC | PRN
  Administered 2013-01-26: 100 mg via INTRAVENOUS

## 2013-01-26 MED ORDER — BISACODYL 10 MG RE SUPP: 10.0000 mg | Freq: Every day | RECTAL | Status: DC | PRN

## 2013-01-26 MED ORDER — LACTATED RINGERS IV SOLN
INTRAVENOUS | Status: DC
  Administered 2013-01-26: 10:00:00 via INTRAVENOUS

## 2013-01-26 MED ORDER — LACTATED RINGERS IV SOLN
INTRAVENOUS | Status: DC | PRN
  Administered 2013-01-26 (×2): via INTRAVENOUS

## 2013-01-26 MED ORDER — GLYCOPYRROLATE 0.2 MG/ML IJ SOLN
INTRAMUSCULAR | Status: DC | PRN
  Administered 2013-01-26: .8 mg via INTRAVENOUS

## 2013-01-26 MED ORDER — OXYCODONE HCL 5 MG PO TABS
ORAL_TABLET | ORAL | Status: AC
  Filled 2013-01-26: qty 2

## 2013-01-26 MED ORDER — ASPIRIN EC 325 MG PO TBEC
325.0000 mg | DELAYED_RELEASE_TABLET | Freq: Two times a day (BID) | ORAL | Status: DC
  Administered 2013-01-26 – 2013-01-27 (×2): 325 mg via ORAL
  Filled 2013-01-26 (×4): qty 1

## 2013-01-26 MED ORDER — PANTOPRAZOLE SODIUM 40 MG PO TBEC
80.0000 mg | DELAYED_RELEASE_TABLET | Freq: Every day | ORAL | Status: DC
  Administered 2013-01-26 – 2013-01-27 (×2): 80 mg via ORAL
  Filled 2013-01-26 (×2): qty 2

## 2013-01-26 MED ORDER — FENTANYL CITRATE 0.05 MG/ML IJ SOLN
INTRAMUSCULAR | Status: DC | PRN
  Administered 2013-01-26: 50 ug via INTRAVENOUS

## 2013-01-26 MED ORDER — SODIUM CHLORIDE 0.9 % IR SOLN
Status: DC | PRN
  Administered 2013-01-26: 1000 mL

## 2013-01-26 MED ORDER — FENTANYL CITRATE 0.05 MG/ML IJ SOLN
25.0000 ug | INTRAMUSCULAR | Status: DC | PRN
  Administered 2013-01-26 (×2): 25 ug via INTRAVENOUS
  Administered 2013-01-26: 50 ug via INTRAVENOUS
  Administered 2013-01-26: 25 ug via INTRAVENOUS

## 2013-01-26 MED ORDER — SODIUM CHLORIDE 0.9 % IV SOLN
INTRAVENOUS | Status: DC
  Administered 2013-01-27: 02:00:00 via INTRAVENOUS

## 2013-01-26 MED ORDER — FENTANYL CITRATE 0.05 MG/ML IJ SOLN
INTRAMUSCULAR | Status: AC
  Administered 2013-01-26: 50 ug via INTRAVENOUS
  Filled 2013-01-26: qty 2

## 2013-01-26 MED ORDER — CEFAZOLIN SODIUM-DEXTROSE 2-3 GM-% IV SOLR
2.0000 g | Freq: Four times a day (QID) | INTRAVENOUS | Status: AC
Start: 2013-01-26 — End: 2013-01-27
  Administered 2013-01-26 – 2013-01-27 (×3): 2 g via INTRAVENOUS
  Filled 2013-01-26 (×3): qty 50

## 2013-01-26 MED ORDER — ROPIVACAINE HCL 5 MG/ML IJ SOLN
INTRAMUSCULAR | Status: DC | PRN
  Administered 2013-01-26: 30 mL via PERINEURAL

## 2013-01-26 MED ORDER — OXYCODONE HCL 5 MG PO TABS
5.0000 mg | ORAL_TABLET | ORAL | Status: DC | PRN
  Administered 2013-01-26 – 2013-01-27 (×5): 10 mg via ORAL
  Filled 2013-01-26 (×4): qty 2

## 2013-01-26 MED ORDER — TAMSULOSIN HCL 0.4 MG PO CAPS
0.4000 mg | ORAL_CAPSULE | Freq: Every day | ORAL | Status: DC
  Administered 2013-01-26 – 2013-01-27 (×2): 0.4 mg via ORAL
  Filled 2013-01-26 (×2): qty 1

## 2013-01-26 MED ORDER — FENTANYL CITRATE 0.05 MG/ML IJ SOLN
50.0000 ug | Freq: Once | INTRAMUSCULAR | Status: AC
  Administered 2013-01-26: 50 ug via INTRAVENOUS
  Filled 2013-01-26: qty 1

## 2013-01-26 MED ORDER — LIDOCAINE HCL (CARDIAC) 20 MG/ML IV SOLN
INTRAVENOUS | Status: DC | PRN
  Administered 2013-01-26: 60 mg via INTRAVENOUS

## 2013-01-26 MED ORDER — FUROSEMIDE 20 MG PO TABS
20.0000 mg | ORAL_TABLET | Freq: Every day | ORAL | Status: DC
  Administered 2013-01-26 – 2013-01-27 (×2): 20 mg via ORAL
  Filled 2013-01-26 (×2): qty 1

## 2013-01-26 MED ORDER — FLEET ENEMA 7-19 GM/118ML RE ENEM: 1.0000 | ENEMA | Freq: Once | RECTAL | Status: AC | PRN

## 2013-01-26 MED ORDER — CEFAZOLIN SODIUM-DEXTROSE 2-3 GM-% IV SOLR
INTRAVENOUS | Status: DC | PRN
  Administered 2013-01-26: 2 g via INTRAVENOUS

## 2013-01-26 MED ORDER — NEOSTIGMINE METHYLSULFATE 1 MG/ML IJ SOLN
INTRAMUSCULAR | Status: DC | PRN
  Administered 2013-01-26: 5 mg via INTRAVENOUS

## 2013-01-26 MED ORDER — LISINOPRIL 2.5 MG PO TABS
2.5000 mg | ORAL_TABLET | Freq: Every day | ORAL | Status: DC
  Administered 2013-01-26 – 2013-01-27 (×2): 2.5 mg via ORAL
  Filled 2013-01-26 (×2): qty 1

## 2013-01-26 MED ORDER — POLYETHYLENE GLYCOL 3350 17 G PO PACK: 17.0000 g | PACK | Freq: Every day | ORAL | Status: DC | PRN

## 2013-01-26 MED ORDER — ACETAMINOPHEN 325 MG PO TABS: 650.0000 mg | ORAL_TABLET | Freq: Four times a day (QID) | ORAL | Status: DC | PRN

## 2013-01-26 MED ORDER — PHENOL 1.4 % MT LIQD: 1.0000 | OROMUCOSAL | Status: DC | PRN

## 2013-01-26 MED ORDER — MOMETASONE FURO-FORMOTEROL FUM 100-5 MCG/ACT IN AERO
2.0000 | INHALATION_SPRAY | Freq: Two times a day (BID) | RESPIRATORY_TRACT | Status: DC
  Administered 2013-01-27: 2 via RESPIRATORY_TRACT
  Filled 2013-01-26: qty 8.8

## 2013-01-26 MED ORDER — METFORMIN HCL 500 MG PO TABS
500.0000 mg | ORAL_TABLET | Freq: Two times a day (BID) | ORAL | Status: DC
  Administered 2013-01-26 – 2013-01-27 (×2): 500 mg via ORAL
  Filled 2013-01-26 (×4): qty 1

## 2013-01-26 MED ORDER — ACETAMINOPHEN 650 MG RE SUPP: 650.0000 mg | Freq: Four times a day (QID) | RECTAL | Status: DC | PRN

## 2013-01-26 MED ORDER — MIDAZOLAM HCL 2 MG/2ML IJ SOLN
INTRAMUSCULAR | Status: AC
  Administered 2013-01-26: 1 mg via INTRAVENOUS
  Filled 2013-01-26: qty 2

## 2013-01-26 MED ORDER — HYDROCODONE-ACETAMINOPHEN 5-325 MG PO TABS: 1.0000 | ORAL_TABLET | ORAL | Status: DC | PRN

## 2013-01-26 SURGICAL SUPPLY — 76 items
ASSEMBLY NECK TAPER FIXED 135 (Orthopedic Implant) ×1 IMPLANT
BIT DRILL 5/64X5 DISP (BIT) IMPLANT
BLADE SAW SAG 73X25 THK (BLADE) ×1
BLADE SAW SGTL 73X25 THK (BLADE) ×1 IMPLANT
BLADE SURG 15 STRL LF DISP TIS (BLADE) ×1 IMPLANT
BLADE SURG 15 STRL SS (BLADE) ×2
BOWL SMART MIX CTS (DISPOSABLE) IMPLANT
CEMENT BONE DEPUY (Cement) ×2 IMPLANT
CHLORAPREP W/TINT 26ML (MISCELLANEOUS) ×4 IMPLANT
CLOTH BEACON ORANGE TIMEOUT ST (SAFETY) ×2 IMPLANT
CLSR STERI-STRIP ANTIMIC 1/2X4 (GAUZE/BANDAGES/DRESSINGS) ×1 IMPLANT
COVER SURGICAL LIGHT HANDLE (MISCELLANEOUS) ×2 IMPLANT
DRAPE INCISE IOBAN 66X45 STRL (DRAPES) ×4 IMPLANT
DRAPE SURG 17X23 STRL (DRAPES) ×2 IMPLANT
DRAPE U-SHAPE 47X51 STRL (DRAPES) ×2 IMPLANT
DRSG ADAPTIC 3X8 NADH LF (GAUZE/BANDAGES/DRESSINGS) IMPLANT
DRSG MEPILEX BORDER 4X8 (GAUZE/BANDAGES/DRESSINGS) ×2 IMPLANT
DRSG PAD ABDOMINAL 8X10 ST (GAUZE/BANDAGES/DRESSINGS) IMPLANT
ELECT BLADE 4.0 EZ CLEAN MEGAD (MISCELLANEOUS)
ELECT REM PT RETURN 9FT ADLT (ELECTROSURGICAL) ×2
ELECTRODE BLDE 4.0 EZ CLN MEGD (MISCELLANEOUS) IMPLANT
ELECTRODE REM PT RTRN 9FT ADLT (ELECTROSURGICAL) ×1 IMPLANT
EVACUATOR 1/8 PVC DRAIN (DRAIN) IMPLANT
GAUZE XEROFORM 1X8 LF (GAUZE/BANDAGES/DRESSINGS) ×1 IMPLANT
GLENOID ANCHOR PEG CROSSLK 48 (Orthopedic Implant) ×1 IMPLANT
GLOVE BIO SURGEON STRL SZ7 (GLOVE) ×2 IMPLANT
GLOVE BIO SURGEON STRL SZ7.5 (GLOVE) ×2 IMPLANT
GLOVE BIOGEL PI IND STRL 7.0 (GLOVE) ×1 IMPLANT
GLOVE BIOGEL PI IND STRL 8 (GLOVE) ×1 IMPLANT
GLOVE BIOGEL PI INDICATOR 7.0 (GLOVE) ×1
GLOVE BIOGEL PI INDICATOR 8 (GLOVE) ×1
GOWN PREVENTION PLUS LG XLONG (DISPOSABLE) ×2 IMPLANT
GOWN STRL REIN XL XLG (GOWN DISPOSABLE) ×2 IMPLANT
HANDPIECE INTERPULSE COAX TIP (DISPOSABLE) ×2
HEAD STANDARD 52X18 (Head) ×2 IMPLANT
HEAD STD 52X18 (Head) IMPLANT
HEMOSTAT SURGICEL 2X14 (HEMOSTASIS) IMPLANT
HOOD PEEL AWAY FACE SHEILD DIS (HOOD) ×4 IMPLANT
KIT BASIN OR (CUSTOM PROCEDURE TRAY) ×2 IMPLANT
KIT ROOM TURNOVER OR (KITS) ×2 IMPLANT
MANIFOLD NEPTUNE II (INSTRUMENTS) ×2 IMPLANT
NDL HYPO 25GX1X1/2 BEV (NEEDLE) IMPLANT
NDL MAYO TROCAR (NEEDLE) ×1 IMPLANT
NEEDLE HYPO 25GX1X1/2 BEV (NEEDLE) IMPLANT
NEEDLE MAYO TROCAR (NEEDLE) ×2 IMPLANT
NS IRRIG 1000ML POUR BTL (IV SOLUTION) ×2 IMPLANT
PACK SHOULDER (CUSTOM PROCEDURE TRAY) ×2 IMPLANT
PAD ARMBOARD 7.5X6 YLW CONV (MISCELLANEOUS) ×4 IMPLANT
RETRIEVER SUT HEWSON (MISCELLANEOUS) IMPLANT
SET HNDPC FAN SPRY TIP SCT (DISPOSABLE) ×1 IMPLANT
SLING ARM IMMOBILIZER LRG (SOFTGOODS) ×2 IMPLANT
SLING ARM IMMOBILIZER MED (SOFTGOODS) IMPLANT
SMARTMIX MINI TOWER (MISCELLANEOUS) ×2
SPONGE GAUZE 4X4 12PLY (GAUZE/BANDAGES/DRESSINGS) ×2 IMPLANT
SPONGE LAP 18X18 X RAY DECT (DISPOSABLE) ×2 IMPLANT
SPONGE LAP 4X18 X RAY DECT (DISPOSABLE) ×6 IMPLANT
STEM GLOBAL AP 12MM (Stem) ×1 IMPLANT
STRIP CLOSURE SKIN 1/2X4 (GAUZE/BANDAGES/DRESSINGS) ×2 IMPLANT
SUCTION FRAZIER TIP 10 FR DISP (SUCTIONS) ×2 IMPLANT
SUPPORT WRAP ARM LG (MISCELLANEOUS) ×2 IMPLANT
SUT ETHIBOND 2 OS 4 DA (SUTURE) IMPLANT
SUT ETHIBOND NAB CT1 #1 30IN (SUTURE) ×2 IMPLANT
SUT FIBERWIRE #2 38 T-5 BLUE (SUTURE) ×6
SUT MNCRL AB 4-0 PS2 18 (SUTURE) ×2 IMPLANT
SUT SILK 2 0 TIES 17X18 (SUTURE)
SUT SILK 2-0 18XBRD TIE BLK (SUTURE) IMPLANT
SUT VIC AB 0 CTB1 27 (SUTURE) IMPLANT
SUT VIC AB 2-0 CT1 27 (SUTURE) ×2
SUT VIC AB 2-0 CT1 TAPERPNT 27 (SUTURE) ×1 IMPLANT
SUTURE FIBERWR #2 38 T-5 BLUE (SUTURE) ×3 IMPLANT
SYR CONTROL 10ML LL (SYRINGE) IMPLANT
TOWEL OR 17X24 6PK STRL BLUE (TOWEL DISPOSABLE) ×2 IMPLANT
TOWEL OR 17X26 10 PK STRL BLUE (TOWEL DISPOSABLE) ×2 IMPLANT
TOWER SMARTMIX MINI (MISCELLANEOUS) ×1 IMPLANT
TRAY FOLEY CATH 16FRSI W/METER (SET/KITS/TRAYS/PACK) IMPLANT
WATER STERILE IRR 1000ML POUR (IV SOLUTION) ×2 IMPLANT

## 2013-01-26 NOTE — Preoperative (Signed)
Beta Blockers   Reason not to administer Beta Blockers:Not Applicable 

## 2013-01-26 NOTE — Anesthesia Preprocedure Evaluation (Addendum)
Anesthesia Evaluation  Patient identified by MRN, date of birth, ID band Patient awake    Reviewed: Allergy & Precautions, H&P , NPO status , Patient's Chart, lab work & pertinent test results  Airway Mallampati: I TM Distance: >3 FB Neck ROM: Full    Dental no notable dental hx. (+) Edentulous Upper, Edentulous Lower and Dental Advisory Given   Pulmonary asthma , resolved,  breath sounds clear to auscultation  Pulmonary exam normal       Cardiovascular hypertension, On Medications + Peripheral Vascular Disease + dysrhythmias Atrial Fibrillation Rhythm:Irregular Rate:Normal     Neuro/Psych negative neurological ROS  negative psych ROS   GI/Hepatic Neg liver ROS, GERD-  Medicated and Controlled,  Endo/Other  diabetes, Type 2, Oral Hypoglycemic Agents  Renal/GU negative Renal ROS  negative genitourinary   Musculoskeletal   Abdominal   Peds  Hematology negative hematology ROS (+) anemia ,   Anesthesia Other Findings   Reproductive/Obstetrics negative OB ROS                         Anesthesia Physical Anesthesia Plan  ASA: III  Anesthesia Plan: General and Regional   Post-op Pain Management:    Induction: Intravenous  Airway Management Planned: Oral ETT  Additional Equipment:   Intra-op Plan:   Post-operative Plan: Extubation in OR  Informed Consent: I have reviewed the patients History and Physical, chart, labs and discussed the procedure including the risks, benefits and alternatives for the proposed anesthesia with the patient or authorized representative who has indicated his/her understanding and acceptance.   Dental advisory given  Plan Discussed with: CRNA and Surgeon  Anesthesia Plan Comments:        Anesthesia Quick Evaluation

## 2013-01-26 NOTE — Op Note (Signed)
Procedure(s): TOTAL SHOULDER ARTHROPLASTY Procedure Note  [name] male 72 y.o. 01/26/2013  Procedure(s) and Anesthesia Type:    * LEFT TOTAL SHOULDER ARTHROPLASTY - Choice  Surgeon(s) and Role:    * Mable Paris, MD - Primary   Indications:  72 y.o. male  With significant left shoulder arthritis. Pain and dysfunction interfered with quality of life and nonoperative treatment with activity modification, NSAIDS and injections failed. 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     Surgeon: Mable Paris   Assistants: Damita Lack PA-C [facility] was present and scrubbed throughout the procedure and was essential in positioning, retraction, exposure, and closure)  Anesthesia: General endotracheal anesthesia with preoperative interscalene block    Procedure Detail  TOTAL SHOULDER ARTHROPLASTY  Findings: DePuy size 12 proximally porous coated press-fit stem with a 52 x 18 head and a 48 glenoid, anchor peg, cemented. Excellent stability and range of motion. Bone quality moderate. A lesser tuberosity osteotomy was performed repaired at the conclusion of the procedure.  Estimated Blood Loss:  200 mL         Drains: 1 medium hemovac  Blood Given: none          Specimens: none        Complications:  * No complications entered in OR log *         Disposition: PACU - hemodynamically stable.         Condition: stable    Procedure:   The patient was identified in the preoperative holding area where I personally marked the operative extremity after verifying with the patient and consent. He  was taken to the operating room where He was transferred to the   operative table.  The patient received an interscalene block in   the holding area by the attending anesthesiologist.  General anesthesia was induced   in the operating room without complication.  The patient did receive IV  Ancef  prior to the commencement of the procedure.  The patient was   placed in the beach-chair position with the back raised about 30   degrees.  The nonoperative extremity and head and neck were carefully   positioned and padded protecting against neurovascular compromise.  The   left upper extremity was then prepped and draped in the standard sterile   fashion.    The appropriate operative time-out was performed with   Anesthesia, the perioperative staff, as well as myself and we all agreed   that the left side was the correct operative site.  An approximately   10 cm incision was made from the tip of the coracoid to the center point of the   humerus at the level of the axilla.  Dissection was carried down sharply   through subcutaneous tissues and cephalic vein was identified and taken   laterally with the deltoid.  The pectoralis major was taken medially.  The   upper 1 cm of the pectoralis major was released from its attachment on   the humerus.  The clavipectoral fascia was incised just lateral to the   conjoined tendon.  This incision was carried up to but not into the   coracoacromial ligament.  Digital palpation was used to prove   integrity of the axillary nerve which was protected throughout the   procedure.  Musculocutaneous nerve was not palpated in the operative   field.  Conjoined tendon was then retracted gently medially and the  deltoid laterally.  Anterior circumflex humeral vessels were clamped and   coagulated.  The soft tissues overlying the biceps was incised and this   incision was carried across the transverse humeral ligament to the base   of the coracoid.  The biceps was tenodesed to the soft tissue just above   pectoralis major and the remaining portion of the biceps superiorly was   excised.  An osteotomy was performed at the lesser tuberosity.  Capsule was then   released all the way down to the 6 o'clock position of the humeral head.   The humeral head was then  delivered with simultaneous adduction,   extension and external rotation.  All humeral osteophytes were removed   and the anatomic neck of the humerus was marked and cut free hand at   approximately 25 degrees retroversion within about 3 mm of the cuff   reflection posteriorly.  The head size was estimated to be a 52 medium   offset.  At that point, the humeral head was retracted posteriorly with   a Fukuda retractor.   Remaining portion of the capsule was released at the base of the   coracoid down to the 6:00 position..  The remaining biceps anchor and the entire anterior-inferior   labrum was excised.  The posterior labrum was also excised but the   posterior capsule was not released.  The guidepin was placed bicortically with plus the elevated guide.  The reamer was used to ream to concentric bone with punctate bleeding.  This gave an excellent concentric surface.  The center hole was then drilled for an anchor peg glenoid followed by the three peripheral holes and none of the holes   exited the glenoid wall.  I then pulse irrigated these holes and dried   them with Surgicel.  The three peripheral holes were then   pressurized cemented and the anchor peg glenoid was placed and impacted   with an excellent fit.  The glenoid was a 48 component.  The proximal humerus was then again exposed taking care not to displace the glenoid.    The humerus was then sequentially reamed going from 6 to 12 by 2 mm incriments. The 12 mm reamer was found to have appropriate cortical contact.  A   box osteotome was then used and a 12-mm broach.  The broach handle was   removed and the trial head was placed.   Calcar reamer was used.The eccentric 52 x 18 head fit best.  With the trial implantation of the component, there was   approximately 50% posterior translation with immediate snap back to the   anatomic position.  With forward elevation, there was no tendency   towards posterior subluxation.   The trial was  removed and the final implant was prepared on a back table.  The trial was removed and the final implant was prepared on a back table.   Small holes were drilled on both sides of the lesser tuberosity osteotomy, through which 3 #2 FiberWires were passed. The implant was then placed through the loop of all 3 #2 FiberWires and impacted with an excellent press-fit. This achieved excellent anatomic reconstruction of the proximal humerus.  The joint was then copiously irrigated with pulse lavage.  The subscapularis and   lesser tuberosity osteotomy were then repaired using the 3 #2 fiber wires previously passed.   One #1 Ethibond was placed at the rotator interval just above   the lesser tuberosity. After repair of the  lesser tuberosity, a medium   Hemovac was placed out anterolaterally and again copious irrigation was   used.   Skin was closed with 2-0 Vicryl sutures in the deep dermal layer and 4-0 Monocryl in a subcuticular  running fashion.  Sterile dressings were then applied including Steri- Strips, 4x4s, ABDs and tape.  The patient was placed in a sling and allowed to awaken from general anesthesia and taken to the recovery room in stable  condition.      POSTOPERATIVE PLAN:  Early passive range of motion will be allowed with the goal of 40 degrees external rotation and 140 degrees forward elevation.  No internal rotation at this time.  No active motion of the arm until the lesser tuberosity heels.  The patient will likely be kept in the hospital for 1-2 days and then discharged home.

## 2013-01-26 NOTE — H&P (Signed)
[name] is an 72 y.o. male.   Chief Complaint: L shoulder pain and dysfunction. HPI: L shoulder arthritis, failed nonop treatment with injections, medication, and previous arthroscopic surgery.  Pain severe and interfering with sleep and quality of life.  Past Medical History  Diagnosis Date  . Diverticulosis of colon (without mention of hemorrhage)   . Other pulmonary embolism and infarction   . Restless legs syndrome (RLS)   . Obesity, unspecified   . Gastroparesis   . Lumbago   . Anxiety state, unspecified   . Pure hypercholesterolemia     takes Atorvastatin daily  . Hemorrhoids   . Back pain, chronic   . Hx of gout     but doesn't take any meds  . Bruises easily     pt is on Coumadin  . Cancer     SKIN CANCER REMOVED  . Esophageal reflux     takes Omeprazole daily  . Insomnia     takes Ambien as nightly as needed  . Atrial fibrillation     takes Coumadin daily at bedtime  . Type II or unspecified type diabetes mellitus without mention of complication, not stated as uncontrolled     takes Metformin daily  . Unspecified essential hypertension     takes Lisinopril daily  . Depressive disorder, not elsewhere classified     takes Prozac daily  . History of blood clots     behind right knee and then went into left lung 5yrs ago  . Peripheral edema     takes Furosemide daily  . Unspecified asthma(493.90)     as a child  . History of bronchitis     spring of 2014--Advair prn  . Pneumonia     last time about 69yrs ago  . Arthritis   . Joint swelling   . Joint pain   . Chronic back pain   . History of colon polyps   . History of kidney stones   . Hard of hearing     wears hearing aids    Past Surgical History  Procedure Laterality Date  . Knee surgery Right     x 4  . Artery repair      Left forearm  . Foot surgery      Left foot   . Replacement total knee Left     x 2  . Breath tek h pylori  01/08/2011    Procedure: BREATH TEK H PYLORI;  Surgeon:  Valarie Merino, MD;  Location: [facility];  Service: General;  Laterality: N/A;  . Hand surgery      LEFT  . Leg surgery      FOR NECROTIZING FASCITIS L LEG AND GROIN  . 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 Shoulder arthroscopy    . Shoulder surgery Left 2014  . Back surgery      x 3  . Knee surgery Left     arthroscopy  . Eye surgery      cataract bil  . Injections in back      x 18  . Colonoscopy    . Cystoscopy      Family History  Problem Relation Age of Onset  . Uterine cancer Mother     mets  . Breast cancer Mother   . Cancer Mother     cervical  .  Emphysema Father   . Alzheimer's disease Sister   . Prostate cancer Brother   . Heart attack Brother   . Colon cancer Brother 75   Social History:  reports that he has never smoked. He has never used smokeless tobacco. He reports that he does not drink alcohol or use illicit drugs.  Allergies:  Allergies  Allergen Reactions  . Sulfacetamide Sodium     REACTION: Throat swelling  . Adhesive [Tape]     Tears skin    Medications Prior to Admission  Medication Sig Dispense Refill  . acetaminophen (TYLENOL) 500 MG tablet Take 1,000 mg by mouth every 6 (six) hours as needed for moderate pain.      [name] atorvastatin (LIPITOR) 80 MG tablet Take 1 tablet (80 mg total) by mouth at bedtime.  90 tablet  3  . FLUoxetine (PROZAC) 20 MG capsule Take 1 capsule (20 mg total) by mouth daily.  90 capsule  3  . Fluticasone-Salmeterol (ADVAIR) 250-50 MCG/DOSE AEPB Inhale 1 puff into the lungs 2 (two) times daily as needed (shortness of breath).      . furosemide (LASIX) 20 MG tablet Take 1-2  tablets (20-40  mg total) by mouth daily.  135 tablet  3  . lisinopril (PRINIVIL,ZESTRIL) 2.5 MG tablet Take 2.5 mg by mouth daily.      . metFORMIN (GLUCOPHAGE) 500 MG tablet Take 1 tablet  (500 mg total) by mouth 2 (two) times daily with a meal.  180 tablet  3  . omeprazole (PRILOSEC) 40 MG capsule Take 1 capsule (40 mg total) by mouth daily.  90 capsule  3  . silodosin (RAPAFLO) 8 MG CAPS capsule Take 1 capsule (8 mg total) by mouth daily with breakfast.  90 capsule  3  . triamcinolone cream (KENALOG) 0.1 % Apply 1 application topically 2 (two) times daily as needed (dry skin).       [name] warfarin (COUMADIN) 2.5 MG tablet Take 2.5 mg by mouth at bedtime.      [name] zolpidem (AMBIEN) 5 MG tablet Take 5-10 mg by mouth at bedtime as needed for sleep.        Results for orders placed during the hospital encounter of 01/26/13 (from the past 48 hour(s))  APTT     Status: None   Collection Time    01/26/13 10:01 AM      Result Value Range   aPTT 32  24 - 37 seconds  PROTIME-INR     Status: None   Collection Time    01/26/13 10:01 AM      Result Value Range   Prothrombin Time 14.4  11.6 - 15.2 seconds   INR 1.14  0.00 - 1.49   No results found.  Review of Systems  All other systems reviewed and are negative.    Blood pressure 117/91, pulse 86, temperature 97.7 F (36.5 C), temperature source Oral, resp. rate 20, SpO2 100.00%. Physical Exam  Constitutional: He is oriented to person, place, and time. He appears well-developed and well-nourished.  HENT:  Head: Atraumatic.  Eyes: EOM are normal.  Cardiovascular: Intact distal pulses.   Respiratory: Effort normal.  Musculoskeletal:       Left shoulder: He exhibits decreased range of motion and pain.  Neurological: He is alert and oriented to person, place, and time.  Skin: Skin is warm and dry.  Psychiatric: He has a normal mood and affect.     Assessment/Plan L shoulder arthritis Plan L total shoulder replacement Risks /  benefits of surgery discussed Consent on chart  NPO for OR Preop antibiotics   [name] [name] 01/26/2013, 11:04 AM

## 2013-01-26 NOTE — Transfer of Care (Signed)
Immediate Anesthesia Transfer of Care Note  Patient: [name]  Procedure(s) Performed: Procedure(s) with comments: TOTAL SHOULDER ARTHROPLASTY (Left) - Left total shoulder arthroplasty  Patient Location: PACU  Anesthesia Type:General  Level of Consciousness: awake, oriented and patient cooperative  Airway & Oxygen Therapy: Patient Spontanous Breathing and Patient connected to face mask oxygen  Post-op Assessment: Report given to PACU RN, Post -op Vital signs reviewed and stable and Patient moving all extremities X 4  Post vital signs: Reviewed and stable  Complications: No apparent anesthesia complications

## 2013-01-26 NOTE — Anesthesia Postprocedure Evaluation (Signed)
Anesthesia Post Note  Patient: [name]  Procedure(s) Performed: Procedure(s) (LRB): TOTAL SHOULDER ARTHROPLASTY (Left)  Anesthesia type: general  Patient location: PACU  Post pain: Pain level controlled  Post assessment: Patient's Cardiovascular Status Stable  Last Vitals:  Filed Vitals:   01/26/13 1530  BP: 132/72  Pulse: 69  Temp: 36.1 C  Resp: 19    Post vital signs: Reviewed and stable  Level of consciousness: sedated  Complications: No apparent anesthesia complications

## 2013-01-26 NOTE — Anesthesia Procedure Notes (Addendum)
Anesthesia Regional Block:  Interscalene brachial plexus block  Pre-Anesthetic Checklist: ,, timeout performed, Correct Patient, Correct Site, Correct Laterality, Correct Procedure, Correct Position, site marked, Risks and benefits discussed, pre-op evaluation,  At surgeon's request and post-op pain management  Laterality: Left  Prep: Maximum Sterile Barrier Precautions used and chloraprep       Needles:  Injection technique: Single-shot  Needle Type: Echogenic Stimulator Needle     Needle Length: 5cm 5 cm Needle Gauge: 22 and 22 G    Additional Needles:  Procedures: ultrasound guided (picture in chart) and nerve stimulator Interscalene brachial plexus block  Nerve Stimulator or Paresthesia:  Response: Biceps response,   Additional Responses:   Narrative:  Start time: 01/26/2013 11:10 AM End time: 01/26/2013 11:19 AM Injection made incrementally with aspirations every 5 mL. Anesthesiologist: Sampson Goon, MD  Additional Notes: 2% Lidocaine skin wheel.    Procedure Name: Intubation Date/Time: 01/26/2013 12:05 PM Performed by: Sherie Don Pre-anesthesia Checklist: Patient identified, Emergency Drugs available, Suction available, Patient being monitored and Timeout performed Patient Re-evaluated:Patient Re-evaluated prior to inductionOxygen Delivery Method: Circle system utilized Preoxygenation: Pre-oxygenation with 100% oxygen Intubation Type: IV induction Ventilation: Mask ventilation without difficulty Laryngoscope Size: Mac and 3 Grade View: Grade I Tube type: Oral Tube size: 7.5 mm Number of attempts: 1 Airway Equipment and Method: Stylet and LTA kit utilized Placement Confirmation: ETT inserted through vocal cords under direct vision,  positive ETCO2 and breath sounds checked- equal and bilateral Secured at: 22 cm Tube secured with: Tape Comments: Easy atraumatic induction and intubation with MAC 3 blade.  Dr. Sampson Goon verified placement of ETT.  Carlynn Herald, CRNA

## 2013-01-27 LAB — CBC
HCT: 35.9 % — ABNORMAL LOW (ref 39.0–52.0)
MCHC: 34 g/dL (ref 30.0–36.0)
Platelets: 175 10*3/uL (ref 150–400)
RDW: 13.4 % (ref 11.5–15.5)
WBC: 8.4 10*3/uL (ref 4.0–10.5)

## 2013-01-27 LAB — BASIC METABOLIC PANEL
BUN: 16 mg/dL (ref 6–23)
Chloride: 107 mEq/L (ref 96–112)
GFR calc Af Amer: 82 mL/min — ABNORMAL LOW (ref >90)
GFR calc non Af Amer: 71 mL/min — ABNORMAL LOW (ref >90)
Potassium: 4.1 mEq/L (ref 3.5–5.1)
Sodium: 138 mEq/L (ref 135–145)

## 2013-01-27 LAB — GLUCOSE, CAPILLARY: Glucose-Capillary: 153 mg/dL — ABNORMAL HIGH (ref 70–99)

## 2013-01-27 NOTE — Progress Notes (Signed)
UR completed 

## 2013-01-27 NOTE — Evaluation (Signed)
Occupational Therapy Evaluation and Discharge Patient Details Name: [name] MRN: [id number] DOB: 1940-12-18 Today's Date: 01/27/2013 Time: 6301-6010 OT Time Calculation (min): 20 min  OT Assessment / Plan / Recommendation History of present illness  LEFT TOTAL SHOULDER ARTHROPLASTY    Clinical Impression   This 72 yo male admitted and underwent above presents to acute OT with all education completed with him and his daughter as well as handout given for basic care and exercises per Dr. Veda Canning protocol. Will D/C from acute OT.    OT Assessment  Progress rehab of shoulder as ordered by MD at follow-up appointment    Follow Up Recommendations  No OT follow up       Equipment Recommendations  None recommended by OT          Precautions / Restrictions Precautions Precautions: Shoulder Shoulder Interventions: Shoulder sling/immobilizer;Off for dressing/bathing/exercises Required Braces or Orthoses: Sling Restrictions Weight Bearing Restrictions: Yes LUE Weight Bearing: Non weight bearing        Acute Rehab OT Goals Patient Stated Goal: Home today  Visit Information  Last OT Received On: 01/27/13 Assistance Needed: +1 History of Present Illness:  LEFT TOTAL SHOULDER ARTHROPLASTY        Prior Functioning     Home Living Family/patient expects to be discharged to:: Private residence Living Arrangements: Spouse/significant other Available Help at Discharge: Family;Available 24 hours/day Prior Function Level of Independence: Independent Communication Communication: No difficulties Dominant Hand: Right         Vision/Perception Vision - History Patient Visual Report: No change from baseline   Cognition  Cognition Arousal/Alertness: Awake/alert Behavior During Therapy: WFL for tasks assessed/performed Overall Cognitive Status: Within Functional Limits for tasks assessed    Extremity/Trunk Assessment Upper Extremity Assessment Upper Extremity  Assessment: LUE deficits/detail LUE Deficits / Details: This hospital admission for TSA; elbow a little tight for full extension at this time; rest WNL LUE Coordination: decreased gross motor     Mobility Bed Mobility Details for Bed Mobility Assistance: Pt up at EOB upoon arrival Transfers Transfers: Sit to Stand;Stand to Sit Sit to Stand: 5: Supervision Stand to Sit: 5: Supervision     Exercise Other Exercises Other Exercises: Educated pt on elbow, wrist, hand exercises; as well as supine shoulder flexion/extension (no more than 140 degrees, pt can currently get approximately 30 degrees) and shoulder internal/external rotation (no more than 40 degrees past neutral; pt can currently get appropimately -10 degrees to neutral). All exerises 5x/day 10 reps each  Donning/doffing shirt without moving shoulder:  (pt/daughter verbalized understanding, already had shirt on) Method for sponge bathing under operated UE:  (pt/daughter verbalized understanding, already had shirt on) Donning/doffing sling/immobilizer: Independent Correct positioning of sling/immobilizer: Independent Pendulum exercises (written home exercise program):  (NA) ROM for elbow, wrist and digits of operated UE: Independent Sling wearing schedule (on at all times/off for ADL's): Independent Proper positioning of operated UE when showering: Independent Dressing change:  (NA) Positioning of UE while sleeping: Independent      End of Session OT - End of Session Activity Tolerance: Patient tolerated treatment well Patient left: in chair;with family/visitor present Nurse Communication:  (Pt finished with OT and is ready to be D/C'd)       Evette Georges [phone] 01/27/2013, 3:22 PM

## 2013-01-27 NOTE — Progress Notes (Signed)
PATIENT ID: [name]   1 Day Post-Op Procedure(s) (LRB): TOTAL SHOULDER ARTHROPLASTY (Left)  Subjective: Pain under better control this am. Left shoulder pain overnight and problems with bleeding from superficial skin lesions secondary to skin reaction to adhesive/ thin skin. Controlled this am.   Objective:  Filed Vitals:   01/27/13 0514  BP: 103/60  Pulse: 81  Temp: 98.1 F (36.7 C)  Resp: 16     L UE dressing with some scant dry blood Diffuse ecchymosis and blistering L UE under dressings Wiggles fingers, distally nvi Activates deltoid  Labs:   Recent Labs  01/27/13 0516  HGB 12.2*   Recent Labs  01/27/13 0516  WBC 8.4  RBC 3.79*  HCT 35.9*  PLT 175   Recent Labs  01/27/13 0516  NA 138  K 4.1  CL 107  CO2 21  BUN 16  CREATININE 1.03  GLUCOSE 155*  CALCIUM 7.9*    Assessment and Plan: 1 day s/p TSA, left Skin breakdown and bruising secondary to adhesive draping for surgery, every precaution was taken at the time to minimize this. Patient was made aware and will perform dressing changes as necessary.  Will work with OT on FF limited to 140 and ER limited to 40 deg, hand wrist elbow Okay to d/c home today after OT Percocet for home pain control, script in chart Resume Coumadin 5 days post operatively Follow up with Dr. Ave Filter in 2 weeks  VTE proph: SCDs

## 2013-01-27 NOTE — Discharge Summary (Signed)
Patient ID: [name] MRN: [id number] DOB/AGE: Mar 30, 1940 72 y.o.  Admit date: 01/26/2013 Discharge date: 01/27/2013  Admission Diagnoses:  Active Problems:   Shoulder arthritis   Discharge Diagnoses:  Same  Past Medical History  Diagnosis Date  . Diverticulosis of colon (without mention of hemorrhage)   . Other pulmonary embolism and infarction   . Restless legs syndrome (RLS)   . Obesity, unspecified   . Gastroparesis   . Lumbago   . Anxiety state, unspecified   . Pure hypercholesterolemia     takes Atorvastatin daily  . Hemorrhoids   . Back pain, chronic   . Hx of gout     but doesn't take any meds  . Bruises easily     pt is on Coumadin  . Cancer     SKIN CANCER REMOVED  . Esophageal reflux     takes Omeprazole daily  . Insomnia     takes Ambien as nightly as needed  . Atrial fibrillation     takes Coumadin daily at bedtime  . Type II or unspecified type diabetes mellitus without mention of complication, not stated as uncontrolled     takes Metformin daily  . Unspecified essential hypertension     takes Lisinopril daily  . Depressive disorder, not elsewhere classified     takes Prozac daily  . History of blood clots     behind right knee and then went into left lung 82yrs ago  . Peripheral edema     takes Furosemide daily  . Unspecified asthma(493.90)     as a child  . History of bronchitis     spring of 2014--Advair prn  . Pneumonia     last time about 68yrs ago  . Arthritis   . Joint swelling   . Joint pain   . Chronic back pain   . History of colon polyps   . History of kidney stones   . Hard of hearing     wears hearing aids    Surgeries: Procedure(s): TOTAL SHOULDER ARTHROPLASTY on 01/26/2013   Consultants:    Discharged Condition: Improved  Hospital Course: [name] is an 72 y.o. male who was admitted 01/26/2013 for operative treatment of<principal problem not specified>. Patient has severe unremitting pain that affects  sleep, daily activities, and work/hobbies. After pre-op clearance the patient was taken to the operating room on 01/26/2013 and underwent  Procedure(s): TOTAL SHOULDER ARTHROPLASTY.    Patient was given perioperative antibiotics: Anti-infectives   Start     Dose/Rate Route Frequency Ordered Stop   01/26/13 1615  ceFAZolin (ANCEF) IVPB 2 g/50 mL premix     2 g 100 mL/hr over 30 Minutes Intravenous Every 6 hours 01/26/13 1430 01/27/13 0445   01/26/13 0600  ceFAZolin (ANCEF) IVPB 2 g/50 mL premix  Status:  Discontinued     2 g 100 mL/hr over 30 Minutes Intravenous On call to O.R. 01/25/13 1445 01/26/13 1430       Patient was given sequential compression devices, early ambulation to prevent DVT. Coumadin was stopped for this procedure and he will resume it 5 days post operatively.  Patient benefited maximally from hospital stay and there were no complications.    Recent vital signs: Patient Vitals for the past 24 hrs:  BP Temp Pulse Resp SpO2  01/27/13 0514 103/60 mmHg 98.1 F (36.7 C) 81 16 99 %  01/27/13 0144 118/70 mmHg 98.5 F (36.9 C) 54 16 100 %  01/26/13 2036 119/64 mmHg 98.3 F (36.8  C) 99 16 100 %     Recent laboratory studies:  Recent Labs  01/26/13 1001 01/27/13 0516  WBC  --  8.4  HGB  --  12.2*  HCT  --  35.9*  PLT  --  175  NA  --  138  K  --  4.1  CL  --  107  CO2  --  21  BUN  --  16  CREATININE  --  1.03  GLUCOSE  --  155*  INR 1.14  --   CALCIUM  --  7.9*     Discharge Medications:     Medication List         acetaminophen 500 MG tablet  Commonly known as:  TYLENOL  Take 1,000 mg by mouth every 6 (six) hours as needed for moderate pain.     atorvastatin 80 MG tablet  Commonly known as:  LIPITOR  Take 1 tablet (80 mg total) by mouth at bedtime.     docusate sodium 100 MG capsule  Commonly known as:  COLACE  Take 1 capsule (100 mg total) by mouth 3 (three) times daily as needed.     FLUoxetine 20 MG capsule  Commonly known as:  PROZAC   Take 1 capsule (20 mg total) by mouth daily.     Fluticasone-Salmeterol 250-50 MCG/DOSE Aepb  Commonly known as:  ADVAIR  Inhale 1 puff into the lungs 2 (two) times daily as needed (shortness of breath).     furosemide 20 MG tablet  Commonly known as:  LASIX  Take 1-2  tablets (20-40  mg total) by mouth daily.     lisinopril 2.5 MG tablet  Commonly known as:  PRINIVIL,ZESTRIL  Take 2.5 mg by mouth daily.     metFORMIN 500 MG tablet  Commonly known as:  GLUCOPHAGE  Take 1 tablet (500 mg total) by mouth 2 (two) times daily with a meal.     omeprazole 40 MG capsule  Commonly known as:  PRILOSEC  Take 1 capsule (40 mg total) by mouth daily.     oxyCODONE-acetaminophen 5-325 MG per tablet  Commonly known as:  ROXICET  Take 1-2 tablets by mouth every 4 (four) hours as needed for severe pain.     silodosin 8 MG Caps capsule  Commonly known as:  RAPAFLO  Take 1 capsule (8 mg total) by mouth daily with breakfast.     triamcinolone cream 0.1 %  Commonly known as:  KENALOG  Apply 1 application topically 2 (two) times daily as needed (dry skin).     warfarin 2.5 MG tablet  Commonly known as:  COUMADIN  Take 1 tablet (2.5 mg total) by mouth at bedtime.  Start taking on:  01/31/2013     zolpidem 5 MG tablet  Commonly known as:  AMBIEN  Take 5-10 mg by mouth at bedtime as needed for sleep.        Diagnostic Studies: Dg Shoulder Left Port  01/26/2013   CLINICAL DATA:  Postop from left shoulder arthroplasty.  EXAM: PORTABLE LEFT SHOULDER - 1 VIEW  COMPARISON:  None.  FINDINGS: Portable AP view shows a left shoulder prosthesis in appropriate position. No evidence of fracture or dislocation. Overlying skin staples noted.  IMPRESSION: Expected postop appearance of left shoulder prosthesis. No evidence of fracture or dislocation.   Electronically Signed   By: Myles Rosenthal M.D.   On: 01/26/2013 15:16    Disposition: 01-Home or Self Care      Discharge  Orders   Future Appointments  Provider Department Dept Phone   02/08/2013 9:20 AM [facility] Cou[facility] [place] [phone]   02/10/2013 8:00 AM Delmer Islam, RD [facility] [phone]   03/09/2013 9:00 AM Valarie Merino, MD [facility] [place] [phone]   Future Orders Complete By Expires   Call MD / Call 911  As directed    Comments:     If you experience chest pain or shortness of breath, CALL 911 and be transported to the hospital emergency room.  If you develope a fever above 101 F, pus (white drainage) or increased drainage or redness at the wound, or calf pain, call your surgeon's office.   Constipation Prevention  As directed    Comments:     Drink plenty of fluids.  Prune juice may be helpful.  You may use a stool softener, such as Colace (over the counter) 100 mg twice a day.  Use MiraLax (over the counter) for constipation as needed.   Diet - low sodium heart healthy  As directed    Increase activity slowly as tolerated  As directed       Follow-up Information   Follow up with Mable Paris, MD. Schedule an appointment as soon as possible for a visit in 2 weeks.   Specialty:  Orthopedic Surgery   Contact information:   [address] [place] [address] [phone]        Signed: Jiles Harold 01/27/2013, 3:58 PM

## 2013-01-27 NOTE — Progress Notes (Signed)
Orthopedic Tech Progress Note Patient Details:  [name] 1940-07-25 [id number]  Ortho Devices Type of Ortho Device: Arm sling Ortho Device/Splint Interventions: Application   Shawnie Pons 01/27/2013, 9:56 AM

## 2013-01-31 ENCOUNTER — Encounter (HOSPITAL_COMMUNITY): Payer: Self-pay | Admitting: Orthopedic Surgery

## 2013-02-06 DIAGNOSIS — M19019 Primary osteoarthritis, unspecified shoulder: Secondary | ICD-10-CM | POA: Diagnosis not present

## 2013-02-08 ENCOUNTER — Ambulatory Visit (INDEPENDENT_AMBULATORY_CARE_PROVIDER_SITE_OTHER): Payer: Medicare Other

## 2013-02-08 DIAGNOSIS — I824Y9 Acute embolism and thrombosis of unspecified deep veins of unspecified proximal lower extremity: Secondary | ICD-10-CM

## 2013-02-08 DIAGNOSIS — Z7901 Long term (current) use of anticoagulants: Secondary | ICD-10-CM | POA: Diagnosis not present

## 2013-02-08 DIAGNOSIS — I4891 Unspecified atrial fibrillation: Secondary | ICD-10-CM | POA: Diagnosis not present

## 2013-02-10 ENCOUNTER — Ambulatory Visit: Payer: Medicare Other | Admitting: Dietician

## 2013-02-10 DIAGNOSIS — L820 Inflamed seborrheic keratosis: Secondary | ICD-10-CM | POA: Diagnosis not present

## 2013-02-10 DIAGNOSIS — L219 Seborrheic dermatitis, unspecified: Secondary | ICD-10-CM | POA: Diagnosis not present

## 2013-02-10 DIAGNOSIS — Z85828 Personal history of other malignant neoplasm of skin: Secondary | ICD-10-CM | POA: Diagnosis not present

## 2013-02-11 ENCOUNTER — Other Ambulatory Visit: Payer: Self-pay | Admitting: Family Medicine

## 2013-02-12 NOTE — Telephone Encounter (Signed)
Last office visit 01/13/2013.  Ok to refill? 

## 2013-02-13 ENCOUNTER — Other Ambulatory Visit: Payer: Self-pay | Admitting: *Deleted

## 2013-02-13 DIAGNOSIS — M9981 Other biomechanical lesions of cervical region: Secondary | ICD-10-CM | POA: Diagnosis not present

## 2013-02-13 DIAGNOSIS — M542 Cervicalgia: Secondary | ICD-10-CM | POA: Diagnosis not present

## 2013-02-13 NOTE — Telephone Encounter (Signed)
Called to [facility]. 

## 2013-02-14 DIAGNOSIS — E1142 Type 2 diabetes mellitus with diabetic polyneuropathy: Secondary | ICD-10-CM | POA: Diagnosis not present

## 2013-02-14 DIAGNOSIS — E1149 Type 2 diabetes mellitus with other diabetic neurological complication: Secondary | ICD-10-CM | POA: Diagnosis not present

## 2013-02-14 DIAGNOSIS — I10 Essential (primary) hypertension: Secondary | ICD-10-CM | POA: Diagnosis not present

## 2013-02-14 DIAGNOSIS — E785 Hyperlipidemia, unspecified: Secondary | ICD-10-CM | POA: Diagnosis not present

## 2013-03-01 ENCOUNTER — Ambulatory Visit (INDEPENDENT_AMBULATORY_CARE_PROVIDER_SITE_OTHER): Payer: Medicare Other

## 2013-03-01 DIAGNOSIS — I4891 Unspecified atrial fibrillation: Secondary | ICD-10-CM | POA: Diagnosis not present

## 2013-03-01 DIAGNOSIS — I824Y9 Acute embolism and thrombosis of unspecified deep veins of unspecified proximal lower extremity: Secondary | ICD-10-CM | POA: Diagnosis not present

## 2013-03-01 DIAGNOSIS — Z7901 Long term (current) use of anticoagulants: Secondary | ICD-10-CM

## 2013-03-01 LAB — POCT INR: INR: 2.4

## 2013-03-06 DIAGNOSIS — M19019 Primary osteoarthritis, unspecified shoulder: Secondary | ICD-10-CM | POA: Diagnosis not present

## 2013-03-09 ENCOUNTER — Ambulatory Visit (INDEPENDENT_AMBULATORY_CARE_PROVIDER_SITE_OTHER): Payer: Medicare Other | Admitting: Surgery

## 2013-03-09 ENCOUNTER — Encounter (INDEPENDENT_AMBULATORY_CARE_PROVIDER_SITE_OTHER): Payer: Self-pay | Admitting: Surgery

## 2013-03-09 VITALS — BP 116/80 | HR 68 | Temp 97.5°F | Resp 14 | Ht 75.0 in | Wt 207.4 lb

## 2013-03-09 DIAGNOSIS — M793 Panniculitis, unspecified: Secondary | ICD-10-CM

## 2013-03-09 NOTE — Patient Instructions (Signed)
Thanks for your patience.  If you need further assistance after leaving the office, please call our office and speak with a [facility] nurse.  [phone].  If you want to leave a message for Dr. Maeby Vankleeck, please call his office phone at [phone]. 

## 2013-03-09 NOTE — Progress Notes (Signed)
[name] comes in today to discuss panniculectomy. He has had a left shoulder arthroplasty recently and is recovering from that. His pannus is quite prominent and hangs over his belt.  His weight today is 207.4 BMI is down to 25.9. His diabetes has resolved completely he no longer takes is metformin. His preop weight was 310 pounds and so he has lost over 100 pounds since his surgery in July of 2013.  We will need to try to submit him for approval for panniculectomy. Photographs have been taken previously and he does have intermittent  breakdown and irritation of this redundant skin. Will submit his chart for sharing into the pain certification for panniculectomy.  [name] 73 y.o.  Body mass index is 25.92 kg/(m^2).  Patient Active Problem List   Diagnosis Date Noted  . Shoulder arthritis 01/26/2013  . Peripheral neuropathy 12/11/2011  . 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 Atrial fibrillation, permanent 08/12/2011  . Bacterial overgrowth syndrome 10/28/2010  . Nonsurgical dumping syndrome 10/14/2010  . Diverticulitis of colon 09/02/2010  . Acute venous embolism and thrombosis of deep vessels of proximal lower extremity 08/01/2010  . OSTEOPOROSIS, DRUG-INDUCED 11/26/2009  . UNSPECIFIED ANEMIA 09/02/2009  . RENAL INSUFFICIENCY 09/02/2009  . ALLERGIC RHINITIS CAUSE UNSPECIFIED 01/17/2009  . ASTHMA, PERSISTENT, MILD 11/13/2008  . FOOT SURGERY, HX OF 12/18/2006  . KNEE REPLACEMENT, LEFT, HX OF 12/18/2006  . HERPES ZOSTER W/NERVOUS COMPLICATION NEC 69/48/5462  . HYPERCHOLESTEROLEMIA 05/19/2006  . ANXIETY 05/19/2006  . DEPRESSION 05/19/2006  . RESTLESS LEG SYNDROME 05/19/2006  . HYPERTENSION 05/19/2006  . DIVERTICULOSIS, COLON 05/19/2006  . IRRITABLE BOWEL SYNDROME 05/19/2006  . LOW BACK PAIN, CHRONIC 05/19/2006    Allergies  Allergen Reactions  . Sulfacetamide Sodium     REACTION: Throat swelling  . Adhesive [Tape]     Tears skin    Past Surgical History   Procedure Laterality Date  . Knee surgery Right     x 4  . Artery repair      Left forearm  . Foot surgery      Left foot   . Replacement total knee Left     x 2  . Breath tek h pylori  01/08/2011    Procedure: BREATH TEK H PYLORI;  Surgeon: Pedro Earls, MD;  Location: [facility];  Service: General;  Laterality: N/A;  . Hand surgery      LEFT  . Leg surgery      FOR NECROTIZING FASCITIS L LEG AND GROIN  . 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 Shoulder arthroscopy    . Shoulder surgery Left 2014  . Back surgery      x 3  . Knee surgery Left     arthroscopy  . Eye surgery      cataract bil  . Injections in back      x 18  . Colonoscopy    . Cystoscopy    . Total shoulder arthroplasty Left 01/26/2013    Procedure: TOTAL SHOULDER ARTHROPLASTY;  Surgeon: Nita Sells, MD;  Location: [facility];  Service: Orthopedics;  Laterality: Left;  Left total shoulder arthroplasty   Eliezer Lofts, MD No diagnosis found.  Will see preop panniculectomy Jonathan B. Hassell Done, MD, [facility] [phone] beeper [phone]  03/09/2013 9:33 AM

## 2013-03-13 DIAGNOSIS — M19019 Primary osteoarthritis, unspecified shoulder: Secondary | ICD-10-CM | POA: Diagnosis not present

## 2013-03-13 DIAGNOSIS — M25519 Pain in unspecified shoulder: Secondary | ICD-10-CM | POA: Diagnosis not present

## 2013-03-16 DIAGNOSIS — M25519 Pain in unspecified shoulder: Secondary | ICD-10-CM | POA: Diagnosis not present

## 2013-03-16 DIAGNOSIS — M19019 Primary osteoarthritis, unspecified shoulder: Secondary | ICD-10-CM | POA: Diagnosis not present

## 2013-03-20 ENCOUNTER — Other Ambulatory Visit: Payer: Self-pay | Admitting: Family Medicine

## 2013-03-20 DIAGNOSIS — M19019 Primary osteoarthritis, unspecified shoulder: Secondary | ICD-10-CM | POA: Diagnosis not present

## 2013-03-20 DIAGNOSIS — M25519 Pain in unspecified shoulder: Secondary | ICD-10-CM | POA: Diagnosis not present

## 2013-03-20 NOTE — Telephone Encounter (Signed)
Last office visit 01/13/2013.  Ok to refill?

## 2013-03-22 NOTE — Telephone Encounter (Signed)
[facility] ... Gasper Sells, MA Student

## 2013-03-23 DIAGNOSIS — M19019 Primary osteoarthritis, unspecified shoulder: Secondary | ICD-10-CM | POA: Diagnosis not present

## 2013-03-23 DIAGNOSIS — M25519 Pain in unspecified shoulder: Secondary | ICD-10-CM | POA: Diagnosis not present

## 2013-03-27 DIAGNOSIS — M25519 Pain in unspecified shoulder: Secondary | ICD-10-CM | POA: Diagnosis not present

## 2013-03-31 ENCOUNTER — Ambulatory Visit (INDEPENDENT_AMBULATORY_CARE_PROVIDER_SITE_OTHER): Payer: Medicare Other | Admitting: Pharmacist

## 2013-03-31 DIAGNOSIS — Z7901 Long term (current) use of anticoagulants: Secondary | ICD-10-CM

## 2013-03-31 DIAGNOSIS — I824Y9 Acute embolism and thrombosis of unspecified deep veins of unspecified proximal lower extremity: Secondary | ICD-10-CM | POA: Diagnosis not present

## 2013-03-31 DIAGNOSIS — I4891 Unspecified atrial fibrillation: Secondary | ICD-10-CM | POA: Diagnosis not present

## 2013-03-31 LAB — POCT INR: INR: 2.1

## 2013-04-03 DIAGNOSIS — M19019 Primary osteoarthritis, unspecified shoulder: Secondary | ICD-10-CM | POA: Diagnosis not present

## 2013-04-03 DIAGNOSIS — M25519 Pain in unspecified shoulder: Secondary | ICD-10-CM | POA: Diagnosis not present

## 2013-04-10 DIAGNOSIS — M25519 Pain in unspecified shoulder: Secondary | ICD-10-CM | POA: Diagnosis not present

## 2013-04-10 DIAGNOSIS — M19019 Primary osteoarthritis, unspecified shoulder: Secondary | ICD-10-CM | POA: Diagnosis not present

## 2013-04-12 DIAGNOSIS — M19019 Primary osteoarthritis, unspecified shoulder: Secondary | ICD-10-CM | POA: Diagnosis not present

## 2013-04-12 DIAGNOSIS — M25519 Pain in unspecified shoulder: Secondary | ICD-10-CM | POA: Diagnosis not present

## 2013-04-12 DIAGNOSIS — M5412 Radiculopathy, cervical region: Secondary | ICD-10-CM | POA: Diagnosis not present

## 2013-04-13 ENCOUNTER — Encounter: Payer: Self-pay | Admitting: *Deleted

## 2013-04-13 ENCOUNTER — Ambulatory Visit (INDEPENDENT_AMBULATORY_CARE_PROVIDER_SITE_OTHER): Payer: Medicare Other

## 2013-04-13 DIAGNOSIS — I824Y9 Acute embolism and thrombosis of unspecified deep veins of unspecified proximal lower extremity: Secondary | ICD-10-CM | POA: Diagnosis not present

## 2013-04-13 DIAGNOSIS — M542 Cervicalgia: Secondary | ICD-10-CM | POA: Diagnosis not present

## 2013-04-13 DIAGNOSIS — M9981 Other biomechanical lesions of cervical region: Secondary | ICD-10-CM | POA: Diagnosis not present

## 2013-04-13 DIAGNOSIS — M5412 Radiculopathy, cervical region: Secondary | ICD-10-CM | POA: Diagnosis not present

## 2013-04-13 DIAGNOSIS — I4891 Unspecified atrial fibrillation: Secondary | ICD-10-CM

## 2013-04-13 DIAGNOSIS — Z7901 Long term (current) use of anticoagulants: Secondary | ICD-10-CM

## 2013-04-13 LAB — POCT INR: INR: 1.9

## 2013-04-14 ENCOUNTER — Other Ambulatory Visit (INDEPENDENT_AMBULATORY_CARE_PROVIDER_SITE_OTHER): Payer: Medicare Other

## 2013-04-14 ENCOUNTER — Telehealth: Payer: Self-pay | Admitting: Family Medicine

## 2013-04-14 DIAGNOSIS — Z79899 Other long term (current) drug therapy: Secondary | ICD-10-CM | POA: Diagnosis not present

## 2013-04-14 DIAGNOSIS — N259 Disorder resulting from impaired renal tubular function, unspecified: Secondary | ICD-10-CM

## 2013-04-14 DIAGNOSIS — E7800 Pure hypercholesterolemia, unspecified: Secondary | ICD-10-CM

## 2013-04-14 LAB — LIPID PANEL
CHOLESTEROL: 115 mg/dL (ref 0–200)
HDL: 43 mg/dL (ref >39.00)
LDL CALC: 52 mg/dL (ref 0–99)
Total CHOL/HDL Ratio: 3
Triglycerides: 101 mg/dL (ref 0.0–149.0)
VLDL: 20.2 mg/dL (ref 0.0–40.0)

## 2013-04-14 LAB — COMPREHENSIVE METABOLIC PANEL
ALBUMIN: 4.2 g/dL (ref 3.5–5.2)
ALK PHOS: 52 U/L (ref 39–117)
ALT: 34 U/L (ref 0–53)
AST: 27 U/L (ref 0–37)
BUN: 17 mg/dL (ref 6–23)
CO2: 25 mEq/L (ref 19–32)
CREATININE: 1.1 mg/dL (ref 0.4–1.5)
Calcium: 9.5 mg/dL (ref 8.4–10.5)
Chloride: 107 mEq/L (ref 96–112)
GFR: 69.31 mL/min (ref >60.00)
GLUCOSE: 137 mg/dL — AB (ref 70–99)
Potassium: 4.8 mEq/L (ref 3.5–5.1)
Sodium: 141 mEq/L (ref 135–145)
Total Bilirubin: 0.9 mg/dL (ref 0.3–1.2)
Total Protein: 6.8 g/dL (ref 6.0–8.3)

## 2013-04-14 NOTE — Telephone Encounter (Signed)
Message copied by Jinny Sanders on Fri Apr 14, 2013  9:52 AM ------      Message from: Ellamae Sia      Created: Wed Apr 12, 2013  6:31 PM      Regarding: Lab orders for Friday, 3.6.15       Patient is scheduled for CPX labs, please order future labs, Thanks , Terri       ------

## 2013-04-17 DIAGNOSIS — M25519 Pain in unspecified shoulder: Secondary | ICD-10-CM | POA: Diagnosis not present

## 2013-04-17 DIAGNOSIS — M5412 Radiculopathy, cervical region: Secondary | ICD-10-CM | POA: Diagnosis not present

## 2013-04-17 DIAGNOSIS — M19019 Primary osteoarthritis, unspecified shoulder: Secondary | ICD-10-CM | POA: Diagnosis not present

## 2013-04-17 DIAGNOSIS — M542 Cervicalgia: Secondary | ICD-10-CM | POA: Diagnosis not present

## 2013-04-17 DIAGNOSIS — M9981 Other biomechanical lesions of cervical region: Secondary | ICD-10-CM | POA: Diagnosis not present

## 2013-04-19 DIAGNOSIS — M5030 Other cervical disc degeneration, unspecified cervical region: Secondary | ICD-10-CM | POA: Diagnosis not present

## 2013-04-19 DIAGNOSIS — M9981 Other biomechanical lesions of cervical region: Secondary | ICD-10-CM | POA: Diagnosis not present

## 2013-04-19 DIAGNOSIS — M4802 Spinal stenosis, cervical region: Secondary | ICD-10-CM | POA: Diagnosis not present

## 2013-04-19 DIAGNOSIS — M542 Cervicalgia: Secondary | ICD-10-CM | POA: Diagnosis not present

## 2013-04-19 DIAGNOSIS — M47812 Spondylosis without myelopathy or radiculopathy, cervical region: Secondary | ICD-10-CM | POA: Diagnosis not present

## 2013-04-19 DIAGNOSIS — M5412 Radiculopathy, cervical region: Secondary | ICD-10-CM | POA: Diagnosis not present

## 2013-04-20 DIAGNOSIS — M19019 Primary osteoarthritis, unspecified shoulder: Secondary | ICD-10-CM | POA: Diagnosis not present

## 2013-04-20 DIAGNOSIS — M5412 Radiculopathy, cervical region: Secondary | ICD-10-CM | POA: Diagnosis not present

## 2013-04-20 DIAGNOSIS — M9981 Other biomechanical lesions of cervical region: Secondary | ICD-10-CM | POA: Diagnosis not present

## 2013-04-20 DIAGNOSIS — M542 Cervicalgia: Secondary | ICD-10-CM | POA: Diagnosis not present

## 2013-04-20 DIAGNOSIS — M25519 Pain in unspecified shoulder: Secondary | ICD-10-CM | POA: Diagnosis not present

## 2013-04-21 ENCOUNTER — Encounter: Payer: Medicare Other | Admitting: Family Medicine

## 2013-04-21 DIAGNOSIS — M5412 Radiculopathy, cervical region: Secondary | ICD-10-CM | POA: Diagnosis not present

## 2013-04-21 DIAGNOSIS — M19019 Primary osteoarthritis, unspecified shoulder: Secondary | ICD-10-CM | POA: Diagnosis not present

## 2013-04-21 DIAGNOSIS — M542 Cervicalgia: Secondary | ICD-10-CM | POA: Diagnosis not present

## 2013-04-21 DIAGNOSIS — M9981 Other biomechanical lesions of cervical region: Secondary | ICD-10-CM | POA: Diagnosis not present

## 2013-04-21 DIAGNOSIS — Z0289 Encounter for other administrative examinations: Secondary | ICD-10-CM

## 2013-04-24 DIAGNOSIS — M19019 Primary osteoarthritis, unspecified shoulder: Secondary | ICD-10-CM | POA: Diagnosis not present

## 2013-04-24 DIAGNOSIS — M25519 Pain in unspecified shoulder: Secondary | ICD-10-CM | POA: Diagnosis not present

## 2013-04-26 ENCOUNTER — Ambulatory Visit (INDEPENDENT_AMBULATORY_CARE_PROVIDER_SITE_OTHER): Payer: Medicare Other

## 2013-04-26 DIAGNOSIS — I824Y9 Acute embolism and thrombosis of unspecified deep veins of unspecified proximal lower extremity: Secondary | ICD-10-CM | POA: Diagnosis not present

## 2013-04-26 DIAGNOSIS — Z5181 Encounter for therapeutic drug level monitoring: Secondary | ICD-10-CM | POA: Diagnosis not present

## 2013-04-26 DIAGNOSIS — Z7901 Long term (current) use of anticoagulants: Secondary | ICD-10-CM | POA: Diagnosis not present

## 2013-04-26 DIAGNOSIS — I4891 Unspecified atrial fibrillation: Secondary | ICD-10-CM | POA: Diagnosis not present

## 2013-04-26 LAB — POCT INR: INR: 2.8

## 2013-04-27 DIAGNOSIS — I10 Essential (primary) hypertension: Secondary | ICD-10-CM | POA: Diagnosis not present

## 2013-04-27 DIAGNOSIS — M542 Cervicalgia: Secondary | ICD-10-CM | POA: Diagnosis not present

## 2013-04-27 DIAGNOSIS — E663 Overweight: Secondary | ICD-10-CM | POA: Diagnosis not present

## 2013-04-27 DIAGNOSIS — M19019 Primary osteoarthritis, unspecified shoulder: Secondary | ICD-10-CM | POA: Diagnosis not present

## 2013-04-27 DIAGNOSIS — M25519 Pain in unspecified shoulder: Secondary | ICD-10-CM | POA: Diagnosis not present

## 2013-04-27 DIAGNOSIS — M4802 Spinal stenosis, cervical region: Secondary | ICD-10-CM | POA: Diagnosis not present

## 2013-04-28 ENCOUNTER — Other Ambulatory Visit: Payer: Self-pay | Admitting: Neurosurgery

## 2013-04-28 DIAGNOSIS — H02059 Trichiasis without entropian unspecified eye, unspecified eyelid: Secondary | ICD-10-CM | POA: Diagnosis not present

## 2013-04-28 DIAGNOSIS — S0500XA Injury of conjunctiva and corneal abrasion without foreign body, unspecified eye, initial encounter: Secondary | ICD-10-CM | POA: Diagnosis not present

## 2013-05-01 ENCOUNTER — Other Ambulatory Visit: Payer: Self-pay | Admitting: Family Medicine

## 2013-05-01 ENCOUNTER — Encounter (HOSPITAL_COMMUNITY): Payer: Self-pay | Admitting: Pharmacy Technician

## 2013-05-01 DIAGNOSIS — M25519 Pain in unspecified shoulder: Secondary | ICD-10-CM | POA: Diagnosis not present

## 2013-05-01 NOTE — Telephone Encounter (Signed)
Last office visit 01/13/2013.  Ok to refill?

## 2013-05-02 ENCOUNTER — Encounter (HOSPITAL_COMMUNITY)
Admission: RE | Admit: 2013-05-02 | Discharge: 2013-05-02 | Disposition: A | Discharge status: Home / Self Care | Payer: Medicare Other | Source: Ambulatory Visit | Attending: Anesthesiology | Admitting: Anesthesiology

## 2013-05-02 ENCOUNTER — Encounter (HOSPITAL_COMMUNITY)
Admission: RE | Admit: 2013-05-02 | Discharge: 2013-05-02 | Disposition: A | Discharge status: Home / Self Care | Payer: Medicare Other | Source: Ambulatory Visit | Attending: Neurosurgery | Admitting: Neurosurgery

## 2013-05-02 ENCOUNTER — Encounter (HOSPITAL_COMMUNITY): Payer: Self-pay

## 2013-05-02 ENCOUNTER — Other Ambulatory Visit (HOSPITAL_COMMUNITY): Payer: Self-pay | Admitting: *Deleted

## 2013-05-02 DIAGNOSIS — L738 Other specified follicular disorders: Secondary | ICD-10-CM | POA: Diagnosis not present

## 2013-05-02 DIAGNOSIS — L821 Other seborrheic keratosis: Secondary | ICD-10-CM | POA: Diagnosis not present

## 2013-05-02 DIAGNOSIS — Z01818 Encounter for other preprocedural examination: Secondary | ICD-10-CM | POA: Insufficient documentation

## 2013-05-02 DIAGNOSIS — Z01812 Encounter for preprocedural laboratory examination: Secondary | ICD-10-CM | POA: Insufficient documentation

## 2013-05-02 DIAGNOSIS — L678 Other hair color and hair shaft abnormalities: Secondary | ICD-10-CM | POA: Diagnosis not present

## 2013-05-02 DIAGNOSIS — Z85828 Personal history of other malignant neoplasm of skin: Secondary | ICD-10-CM | POA: Diagnosis not present

## 2013-05-02 DIAGNOSIS — I4891 Unspecified atrial fibrillation: Secondary | ICD-10-CM | POA: Diagnosis not present

## 2013-05-02 HISTORY — DX: Peripheral vascular disease, unspecified: I73.9

## 2013-05-02 LAB — CBC
HCT: 41.2 % (ref 39.0–52.0)
Hemoglobin: 13.9 g/dL (ref 13.0–17.0)
MCH: 32 pg (ref 26.0–34.0)
MCHC: 33.7 g/dL (ref 30.0–36.0)
MCV: 94.9 fL (ref 78.0–100.0)
PLATELETS: 214 10*3/uL (ref 150–400)
RBC: 4.34 MIL/uL (ref 4.22–5.81)
RDW: 13.8 % (ref 11.5–15.5)
WBC: 9.6 10*3/uL (ref 4.0–10.5)

## 2013-05-02 LAB — SURGICAL PCR SCREEN
MRSA, PCR: NEGATIVE
Staphylococcus aureus: NEGATIVE

## 2013-05-02 LAB — BASIC METABOLIC PANEL
BUN: 18 mg/dL (ref 6–23)
CALCIUM: 9.6 mg/dL (ref 8.4–10.5)
CHLORIDE: 104 meq/L (ref 96–112)
CO2: 24 meq/L (ref 19–32)
Creatinine, Ser: 1.4 mg/dL — ABNORMAL HIGH (ref 0.50–1.35)
GFR calc Af Amer: 57 mL/min — ABNORMAL LOW (ref >90)
GFR calc non Af Amer: 49 mL/min — ABNORMAL LOW (ref >90)
GLUCOSE: 94 mg/dL (ref 70–99)
Potassium: 4.7 mEq/L (ref 3.7–5.3)
SODIUM: 143 meq/L (ref 137–147)

## 2013-05-02 LAB — PROTIME-INR
INR: 1.19 (ref 0.00–1.49)
Prothrombin Time: 14.8 seconds (ref 11.6–15.2)

## 2013-05-02 LAB — APTT: aPTT: 29 seconds (ref 24–37)

## 2013-05-02 NOTE — Progress Notes (Signed)
Pt's PCP is Dr. Eliezer Lofts  Cardiologist is Dr. Percival Spanish (seen 4-5 months ago). Very long hx of Atrial Fibrillation.  Pt seen today by Dr. Jarome Matin (Dermatologist) for an infected hair follicle. Prescribed Doxycycline for 10 days.

## 2013-05-02 NOTE — Telephone Encounter (Signed)
Called to [facility]. 

## 2013-05-02 NOTE — Pre-Procedure Instructions (Signed)
[name]  05/02/2013   Your procedure is scheduled on:  Tuesday, May 09, 2013 at 9:00 AM.   Report to [facility] Entrance "A" Admitting Office at 6:00 AM.   Call this number if you have problems the morning of surgery: [phone]   Remember:   Do not eat food or drink liquids after midnight Monday, 05/08/13.   Take these medicines the morning of surgery with A SIP OF WATER: FLUoxetine (PROZAC), gabapentin (NEURONTIN), metoprolol succinate (TOPROL-XL), omeprazole (PRILOSEC), silodosin (RAPAFLO), oxyCODONE-acetaminophen (ROXICET) or Tylenol - if needed.     Do not wear jewelry, .  Do not wear lotions, powders, or cologne. You may wear deodorant.  Men may shave face and neck.  Do not bring valuables to the hospital.  [facility] is not responsible                  for any belongings or valuables.               Contacts, dentures or bridgework may not be worn into surgery.  Leave suitcase in the car. After surgery it may be brought to your room.  For patients admitted to the hospital, discharge time is determined by your                treatment team.                Special Instructions: [place] - Preparing for Surgery  Before surgery, you can play an important role.  Because skin is not sterile, your skin needs to be as free of germs as possible.  You can reduce the number of germs on you skin by washing with CHG (chlorahexidine gluconate) soap before surgery.  CHG is an antiseptic cleaner which kills germs and bonds with the skin to continue killing germs even after washing.  Please DO NOT use if you have an allergy to CHG or antibacterial soaps.  If your skin becomes reddened/irritated stop using the CHG and inform your nurse when you arrive at Short Stay.  Do not shave (including legs and underarms) for at least 48 hours prior to the first CHG shower.  You may shave your face.  Please follow these instructions carefully:   1.  Shower with CHG Soap the night  before surgery and the                                morning of Surgery.  2.  If you choose to wash your hair, wash your hair first as usual with your       normal shampoo.  3.  After you shampoo, rinse your hair and body thoroughly to remove the                      Shampoo.  4.  Use CHG as you would any other liquid soap.  You can apply chg directly       to the skin and wash gently with scrungie or a clean washcloth.  5.  Apply the CHG Soap to your body ONLY FROM THE NECK DOWN.        Do not use on open wounds or open sores.  Avoid contact with your eyes, ears, mouth and genitals (private parts).  Wash genitals (private parts) with your normal soap.  6.  Wash thoroughly, paying special attention to the area where your surgery  will be performed.  7.  Thoroughly rinse your body with warm water from the neck down.  8.  DO NOT shower/wash with your normal soap after using and rinsing off       the CHG Soap.  9.  Pat yourself dry with a clean towel.            10.  Wear clean pajamas.            11.  Place clean sheets on your bed the night of your first shower and do not        sleep with pets.  Day of Surgery  Do not apply any lotions the morning of surgery.  Please wear clean clothes to the hospital/surgery center.     Please read over the following fact sheets that you were given: Pain Booklet, Coughing and Deep Breathing, MRSA Information and Surgical Site Infection Prevention

## 2013-05-02 NOTE — Progress Notes (Signed)
Pt no longer having to take any diabetic meds due to weight loss.

## 2013-05-04 ENCOUNTER — Encounter: Payer: Self-pay | Admitting: Family Medicine

## 2013-05-04 ENCOUNTER — Telehealth: Payer: Self-pay | Admitting: Cardiology

## 2013-05-04 NOTE — Telephone Encounter (Signed)
Janett Billow has received faxed clearance

## 2013-05-04 NOTE — Progress Notes (Signed)
Anesthesia Chart Review: Patient is a 73 year old male scheduled for C3-4, C4-5, C5-6 ACDF on 05/09/13 by Dr. Joya Salm.  History includes non-smoker, PE, RLS, gastroparesis, hypercholesterolemia, anxiety, GERD, afib, DM2, HTN, depression, asthma, hard of hearing, nephrolithiasis, gastric bypass, left total shoulder 01/26/13. PCP is Dr. Eliezer Lofts. Cardiologist is Dr. Percival Spanish who cleared patient for this procedure with permission to hold warfarin as needed.  It was held starting 04/27/13.  EKG on 01/12/13 showed afib @ 92 bpm, possible inferior infarct (age undetermined), non-specific ST/T wave changes. HR was 92 at PAT.   Echo on 08/11/11 showed:  - Left ventricle: The cavity size was normal. Wall thickness was increased in a pattern of mild LVH. Systolic function was normal. The estimated ejection fraction was in the range of 55% to 60%. Wall motion was normal; there were no regional wall motion abnormalities. - Left atrium: The atrium was mildly to moderately dilated.  Last cardiac cath was ~ 2000.   CXR on 05/02/13 showed no active cardiopulmonary disease.   Preoperative labs noted.   If no acute changes and follow-up labs are reasonable then I would anticipate that he could proceed as planned.  [facility] Short Stay Center/Anesthesiology Phone [phone] 05/04/2013 5:17 PM

## 2013-05-04 NOTE — Telephone Encounter (Signed)
New Message:  [name] is calling to check on the status of some clearance forms she faxed over.   [phone] - fax

## 2013-05-08 MED ORDER — CEFAZOLIN SODIUM-DEXTROSE 2-3 GM-% IV SOLR
2.0000 g | INTRAVENOUS | Status: AC
  Administered 2013-05-09: 2 g via INTRAVENOUS
  Filled 2013-05-08: qty 50

## 2013-05-08 NOTE — H&P (Signed)
[name] is an 73 y.o. male.   Chief Complaint: neck pain HPI: pahis pain is getting worse, unable to sleep. ent seen with neck pain with radiation to the left arm, which has been going for 6 months. [name] had 2 shoulder surgeries. He had a cervical mri and came to see [place]     Past Medical History  Diagnosis Date  . Diverticulosis of colon (without mention of hemorrhage)   . Other pulmonary embolism and infarction   . Restless legs syndrome (RLS)   . Obesity, unspecified   . Gastroparesis   . Lumbago   . Anxiety state, unspecified   . Pure hypercholesterolemia     takes Atorvastatin daily  . Hemorrhoids   . Back pain, chronic   . Hx of gout     but doesn't take any meds  . Bruises easily     pt is on Coumadin  . Cancer     SKIN CANCER REMOVED  . Esophageal reflux     takes Omeprazole daily  . Insomnia     takes Ambien as nightly as needed  . Atrial fibrillation     takes Coumadin daily at bedtime  . Unspecified essential hypertension     takes Lisinopril daily  . Depressive disorder, not elsewhere classified     takes Prozac daily  . History of blood clots     behind right knee and then went into left lung 67yrs ago  . Peripheral edema     takes Furosemide daily  . Unspecified asthma(493.90)     as a child  . History of bronchitis     spring of 2014--Advair prn  . Pneumonia     last time about 49yrs ago  . Arthritis   . Joint swelling   . Joint pain   . Chronic back pain   . History of colon polyps   . History of kidney stones   . Hard of hearing     wears hearing aids  . Type II or unspecified type diabetes mellitus without mention of complication, not stated as uncontrolled     not taking any medications at this time  . Dysrhythmia     Atrial Fibrillation  . Peripheral vascular disease     Past Surgical History  Procedure Laterality Date  . Knee surgery Right     x 4  . Artery repair      Left forearm  . Foot surgery      Left foot   .  Replacement total knee Left     x 2  . Breath tek h pylori  01/08/2011    Procedure: BREATH TEK H PYLORI;  Surgeon: Pedro Earls, MD;  Location: [facility];  Service: General;  Laterality: N/A;  . Hand surgery      LEFT  . Leg surgery      FOR NECROTIZING FASCITIS L LEG AND GROIN  . 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 Shoulder arthroscopy    . Shoulder surgery Left 2014  . Back surgery      x 3  . Knee surgery Left     arthroscopy  . Eye surgery      cataract bil  . Injections in back      x 18  . Colonoscopy    . Cystoscopy    .  Total shoulder arthroplasty Left 01/26/2013    Procedure: TOTAL SHOULDER ARTHROPLASTY;  Surgeon: Nita Sells, MD;  Location: [facility];  Service: Orthopedics;  Laterality: Left;  Left total shoulder arthroplasty    Family History  Problem Relation Age of Onset  . Uterine cancer Mother     mets  . Breast cancer Mother   . Cancer Mother     cervical  . Emphysema Father   . Alzheimer's disease Sister   . Prostate cancer Brother   . Heart attack Brother   . Colon cancer Brother 69   Social History:  reports that he has never smoked. He has never used smokeless tobacco. He reports that he does not drink alcohol or use illicit drugs.  Allergies:  Allergies  Allergen Reactions  . Sulfacetamide Sodium     REACTION: Throat swelling  . Adhesive [Tape]     Tears skin    No prescriptions prior to admission    No results found for this or any previous visit (from the past 43 hour(s)). No results found.  Review of Systems  Constitutional: Negative.   Eyes: Negative.   Respiratory: Negative.   Cardiovascular: Negative.   Gastrointestinal: Negative.   Genitourinary: Negative.   Musculoskeletal: Positive for neck pain.  Skin: Negative.   Neurological: Positive for sensory  change and focal weakness.  Endo/Heme/Allergies: Negative.   Psychiatric/Behavioral: Negative.     There were no vitals taken for this visit. Physical Exam hent, nl. Nec, pain with mobility. Lungs, some rales.cv, nl. Abdomen, soft. Extremiies, several scars in left shoulder. NEURO WEAKNESS OF LEFT BICEPS AND DELTOIDS. Right arm is normal. Dtr, normal with decrease of left biceps. Cervical x-rays show lack of lordosis with a spep-off at 34, ddd at5-6. Mri, spondylosis at 34and 56. 45 seems to be normal.  Assessment/Planpatient to go ahead with decompression and fusion at -34, 4-5. A decision about whta to do at 4-5 will be made during surgery based on how well we can coorect the lordosis. He is fully aware of risks and benefits  [name] M 05/08/2013, 7:59 PM

## 2013-05-09 ENCOUNTER — Encounter (HOSPITAL_COMMUNITY): Payer: Medicare Other | Admitting: Vascular Surgery

## 2013-05-09 ENCOUNTER — Inpatient Hospital Stay (HOSPITAL_COMMUNITY)
Admission: RE | Admit: 2013-05-09 | Discharge: 2013-05-11 | DRG: 473 | Disposition: A | Discharge status: Home / Self Care | Payer: Medicare Other | Source: Ambulatory Visit | Attending: Neurosurgery | Admitting: Neurosurgery

## 2013-05-09 ENCOUNTER — Encounter (HOSPITAL_COMMUNITY)
Admission: RE | Disposition: A | Discharge status: Home / Self Care | Payer: Self-pay | Source: Ambulatory Visit | Attending: Neurosurgery

## 2013-05-09 ENCOUNTER — Encounter (HOSPITAL_COMMUNITY): Payer: Self-pay | Admitting: *Deleted

## 2013-05-09 ENCOUNTER — Inpatient Hospital Stay (HOSPITAL_COMMUNITY): Payer: Medicare Other | Admitting: Anesthesiology

## 2013-05-09 ENCOUNTER — Inpatient Hospital Stay (HOSPITAL_COMMUNITY): Payer: Medicare Other

## 2013-05-09 DIAGNOSIS — Z96619 Presence of unspecified artificial shoulder joint: Secondary | ICD-10-CM | POA: Diagnosis not present

## 2013-05-09 DIAGNOSIS — M4802 Spinal stenosis, cervical region: Secondary | ICD-10-CM | POA: Insufficient documentation

## 2013-05-09 DIAGNOSIS — I10 Essential (primary) hypertension: Secondary | ICD-10-CM | POA: Diagnosis present

## 2013-05-09 DIAGNOSIS — F329 Major depressive disorder, single episode, unspecified: Secondary | ICD-10-CM | POA: Diagnosis present

## 2013-05-09 DIAGNOSIS — M5030 Other cervical disc degeneration, unspecified cervical region: Principal | ICD-10-CM | POA: Diagnosis present

## 2013-05-09 DIAGNOSIS — G8929 Other chronic pain: Secondary | ICD-10-CM | POA: Diagnosis present

## 2013-05-09 DIAGNOSIS — Z96659 Presence of unspecified artificial knee joint: Secondary | ICD-10-CM

## 2013-05-09 DIAGNOSIS — G2581 Restless legs syndrome: Secondary | ICD-10-CM | POA: Diagnosis present

## 2013-05-09 DIAGNOSIS — I4891 Unspecified atrial fibrillation: Secondary | ICD-10-CM | POA: Diagnosis present

## 2013-05-09 DIAGNOSIS — N289 Disorder of kidney and ureter, unspecified: Secondary | ICD-10-CM | POA: Diagnosis present

## 2013-05-09 DIAGNOSIS — K219 Gastro-esophageal reflux disease without esophagitis: Secondary | ICD-10-CM | POA: Diagnosis present

## 2013-05-09 DIAGNOSIS — G4700 Insomnia, unspecified: Secondary | ICD-10-CM | POA: Diagnosis present

## 2013-05-09 DIAGNOSIS — Z85828 Personal history of other malignant neoplasm of skin: Secondary | ICD-10-CM

## 2013-05-09 DIAGNOSIS — M542 Cervicalgia: Secondary | ICD-10-CM | POA: Diagnosis not present

## 2013-05-09 DIAGNOSIS — E7800 Pure hypercholesterolemia, unspecified: Secondary | ICD-10-CM | POA: Diagnosis present

## 2013-05-09 DIAGNOSIS — S1190XA Unspecified open wound of unspecified part of neck, initial encounter: Secondary | ICD-10-CM | POA: Diagnosis not present

## 2013-05-09 DIAGNOSIS — F3289 Other specified depressive episodes: Secondary | ICD-10-CM | POA: Diagnosis present

## 2013-05-09 DIAGNOSIS — E119 Type 2 diabetes mellitus without complications: Secondary | ICD-10-CM | POA: Diagnosis not present

## 2013-05-09 DIAGNOSIS — I739 Peripheral vascular disease, unspecified: Secondary | ICD-10-CM | POA: Diagnosis present

## 2013-05-09 DIAGNOSIS — M129 Arthropathy, unspecified: Secondary | ICD-10-CM | POA: Diagnosis present

## 2013-05-09 DIAGNOSIS — F411 Generalized anxiety disorder: Secondary | ICD-10-CM | POA: Diagnosis present

## 2013-05-09 DIAGNOSIS — H9190 Unspecified hearing loss, unspecified ear: Secondary | ICD-10-CM | POA: Diagnosis present

## 2013-05-09 DIAGNOSIS — M5090 Cervical disc disorder, unspecified, unspecified cervical region: Secondary | ICD-10-CM | POA: Diagnosis not present

## 2013-05-09 DIAGNOSIS — M109 Gout, unspecified: Secondary | ICD-10-CM | POA: Diagnosis present

## 2013-05-09 DIAGNOSIS — Y831 Surgical operation with implant of artificial internal device as the cause of abnormal reaction of the patient, or of later complication, without mention of misadventure at the time of the procedure: Secondary | ICD-10-CM | POA: Diagnosis not present

## 2013-05-09 DIAGNOSIS — Z9884 Bariatric surgery status: Secondary | ICD-10-CM

## 2013-05-09 DIAGNOSIS — R609 Edema, unspecified: Secondary | ICD-10-CM | POA: Diagnosis present

## 2013-05-09 DIAGNOSIS — J45909 Unspecified asthma, uncomplicated: Secondary | ICD-10-CM | POA: Diagnosis present

## 2013-05-09 HISTORY — PX: ANTERIOR CERVICAL DECOMP/DISCECTOMY FUSION: SHX1161

## 2013-05-09 LAB — GLUCOSE, CAPILLARY
Glucose-Capillary: 122 mg/dL — ABNORMAL HIGH (ref 70–99)
Glucose-Capillary: 137 mg/dL — ABNORMAL HIGH (ref 70–99)
Glucose-Capillary: 103 mg/dL — ABNORMAL HIGH (ref 70–99)
Glucose-Capillary: 183 mg/dL — ABNORMAL HIGH (ref 70–99)

## 2013-05-09 SURGERY — ANTERIOR CERVICAL DECOMPRESSION/DISCECTOMY FUSION 3 LEVELS
Anesthesia: General

## 2013-05-09 MED ORDER — EPHEDRINE SULFATE 50 MG/ML IJ SOLN
INTRAMUSCULAR | Status: AC
  Filled 2013-05-09: qty 1

## 2013-05-09 MED ORDER — INSULIN ASPART 100 UNIT/ML ~~LOC~~ SOLN
0.0000 [IU] | Freq: Three times a day (TID) | SUBCUTANEOUS | Status: DC
  Administered 2013-05-10 – 2013-05-11 (×6): 3 [IU] via SUBCUTANEOUS

## 2013-05-09 MED ORDER — METOPROLOL SUCCINATE ER 25 MG PO TB24
25.0000 mg | ORAL_TABLET | Freq: Two times a day (BID) | ORAL | Status: DC
  Administered 2013-05-09 – 2013-05-11 (×3): 25 mg via ORAL
  Filled 2013-05-09 (×5): qty 1

## 2013-05-09 MED ORDER — ONDANSETRON HCL 4 MG/2ML IJ SOLN
INTRAMUSCULAR | Status: DC | PRN
  Administered 2013-05-09: 4 mg via INTRAVENOUS

## 2013-05-09 MED ORDER — LIDOCAINE HCL 4 % MT SOLN
OROMUCOSAL | Status: DC | PRN
  Administered 2013-05-09: 3 mL via TOPICAL

## 2013-05-09 MED ORDER — HYDROMORPHONE HCL PF 1 MG/ML IJ SOLN
INTRAMUSCULAR | Status: AC
  Filled 2013-05-09: qty 1

## 2013-05-09 MED ORDER — ACETAMINOPHEN 325 MG PO TABS
650.0000 mg | ORAL_TABLET | ORAL | Status: DC | PRN
  Administered 2013-05-09: 650 mg via ORAL
  Filled 2013-05-09: qty 2

## 2013-05-09 MED ORDER — OXYCODONE-ACETAMINOPHEN 5-325 MG PO TABS
1.0000 | ORAL_TABLET | ORAL | Status: DC | PRN
  Administered 2013-05-09 – 2013-05-10 (×3): 2 via ORAL
  Administered 2013-05-10: 1 via ORAL
  Administered 2013-05-11 (×2): 2 via ORAL
  Filled 2013-05-09 (×4): qty 2
  Filled 2013-05-09: qty 1
  Filled 2013-05-09: qty 2

## 2013-05-09 MED ORDER — SODIUM CHLORIDE 0.9 % IV SOLN
INTRAVENOUS | Status: DC
  Administered 2013-05-09: 15:00:00 via INTRAVENOUS

## 2013-05-09 MED ORDER — GABAPENTIN 300 MG PO CAPS
300.0000 mg | ORAL_CAPSULE | Freq: Three times a day (TID) | ORAL | Status: DC
  Administered 2013-05-09 – 2013-05-11 (×6): 300 mg via ORAL
  Filled 2013-05-09 (×7): qty 1

## 2013-05-09 MED ORDER — ZOLPIDEM TARTRATE 5 MG PO TABS
5.0000 mg | ORAL_TABLET | Freq: Every evening | ORAL | Status: DC | PRN
  Administered 2013-05-09 – 2013-05-10 (×2): 5 mg via ORAL
  Filled 2013-05-09 (×2): qty 1

## 2013-05-09 MED ORDER — NAPHAZOLINE-PHENIRAMINE 0.025-0.3 % OP SOLN
1.0000 [drp] | Freq: Two times a day (BID) | OPHTHALMIC | Status: DC | PRN
  Filled 2013-05-09: qty 15

## 2013-05-09 MED ORDER — ONDANSETRON HCL 4 MG/2ML IJ SOLN
4.0000 mg | INTRAMUSCULAR | Status: DC | PRN
  Filled 2013-05-09: qty 2

## 2013-05-09 MED ORDER — LACTATED RINGERS IV SOLN
INTRAVENOUS | Status: DC
  Administered 2013-05-09: 07:00:00 via INTRAVENOUS

## 2013-05-09 MED ORDER — LACTATED RINGERS IV SOLN
INTRAVENOUS | Status: DC | PRN
  Administered 2013-05-09 (×2): via INTRAVENOUS

## 2013-05-09 MED ORDER — MENTHOL 3 MG MT LOZG
1.0000 | LOZENGE | OROMUCOSAL | Status: DC | PRN
Start: 2013-05-09 — End: 2013-05-11

## 2013-05-09 MED ORDER — MORPHINE SULFATE 2 MG/ML IJ SOLN
1.0000 mg | INTRAMUSCULAR | Status: DC | PRN
  Administered 2013-05-09: 2 mg via INTRAVENOUS
  Administered 2013-05-09 – 2013-05-10 (×2): 4 mg via INTRAVENOUS
  Administered 2013-05-10 – 2013-05-11 (×2): 2 mg via INTRAVENOUS
  Filled 2013-05-09: qty 1
  Filled 2013-05-09 (×2): qty 2
  Filled 2013-05-09 (×2): qty 1

## 2013-05-09 MED ORDER — METFORMIN HCL 500 MG PO TABS
500.0000 mg | ORAL_TABLET | Freq: Two times a day (BID) | ORAL | Status: DC
  Administered 2013-05-10 – 2013-05-11 (×4): 500 mg via ORAL
  Filled 2013-05-09 (×5): qty 1

## 2013-05-09 MED ORDER — HYDROMORPHONE HCL PF 1 MG/ML IJ SOLN
0.2500 mg | INTRAMUSCULAR | Status: DC | PRN
  Administered 2013-05-09 (×4): 0.5 mg via INTRAVENOUS

## 2013-05-09 MED ORDER — SUCCINYLCHOLINE CHLORIDE 20 MG/ML IJ SOLN
INTRAMUSCULAR | Status: AC
  Filled 2013-05-09: qty 1

## 2013-05-09 MED ORDER — FENTANYL CITRATE 0.05 MG/ML IJ SOLN
INTRAMUSCULAR | Status: DC | PRN
  Administered 2013-05-09: 25 ug via INTRAVENOUS
  Administered 2013-05-09: 50 ug via INTRAVENOUS
  Administered 2013-05-09: 100 ug via INTRAVENOUS
  Administered 2013-05-09: 25 ug via INTRAVENOUS
  Administered 2013-05-09 (×2): 50 ug via INTRAVENOUS
  Administered 2013-05-09 (×2): 100 ug via INTRAVENOUS

## 2013-05-09 MED ORDER — FENTANYL CITRATE 0.05 MG/ML IJ SOLN
INTRAMUSCULAR | Status: AC
  Filled 2013-05-09: qty 5

## 2013-05-09 MED ORDER — ROCURONIUM BROMIDE 100 MG/10ML IV SOLN
INTRAVENOUS | Status: DC | PRN
  Administered 2013-05-09: 50 mg via INTRAVENOUS

## 2013-05-09 MED ORDER — SURGIFOAM 100 EX MISC
CUTANEOUS | Status: DC | PRN
  Administered 2013-05-09: 09:00:00 via TOPICAL

## 2013-05-09 MED ORDER — FLUOXETINE HCL 20 MG PO CAPS
20.0000 mg | ORAL_CAPSULE | Freq: Every day | ORAL | Status: DC
  Administered 2013-05-09 – 2013-05-11 (×3): 20 mg via ORAL
  Filled 2013-05-09 (×3): qty 1

## 2013-05-09 MED ORDER — ROCURONIUM BROMIDE 50 MG/5ML IV SOLN
INTRAVENOUS | Status: AC
  Filled 2013-05-09: qty 1

## 2013-05-09 MED ORDER — LIDOCAINE HCL (CARDIAC) 20 MG/ML IV SOLN
INTRAVENOUS | Status: AC
  Filled 2013-05-09: qty 5

## 2013-05-09 MED ORDER — ONDANSETRON HCL 4 MG/2ML IJ SOLN
INTRAMUSCULAR | Status: AC
  Filled 2013-05-09: qty 2

## 2013-05-09 MED ORDER — INSULIN ASPART 100 UNIT/ML ~~LOC~~ SOLN
0.0000 [IU] | Freq: Every day | SUBCUTANEOUS | Status: DC
  Administered 2013-05-10: 2 [IU] via SUBCUTANEOUS

## 2013-05-09 MED ORDER — PROPRANOLOL HCL 1 MG/ML IV SOLN
INTRAVENOUS | Status: DC | PRN
  Administered 2013-05-09: .8 mg via INTRAVENOUS

## 2013-05-09 MED ORDER — NEOSTIGMINE METHYLSULFATE 1 MG/ML IJ SOLN
INTRAMUSCULAR | Status: DC | PRN
  Administered 2013-05-09: 3 mg via INTRAVENOUS

## 2013-05-09 MED ORDER — DEXAMETHASONE SODIUM PHOSPHATE 4 MG/ML IJ SOLN
4.0000 mg | Freq: Four times a day (QID) | INTRAMUSCULAR | Status: DC
Start: 2013-05-09 — End: 2013-05-11
  Administered 2013-05-09 – 2013-05-11 (×8): 4 mg via INTRAVENOUS
  Filled 2013-05-09 (×12): qty 1

## 2013-05-09 MED ORDER — TAMSULOSIN HCL 0.4 MG PO CAPS
0.8000 mg | ORAL_CAPSULE | Freq: Every day | ORAL | Status: DC
  Administered 2013-05-09 – 2013-05-10 (×2): 0.8 mg via ORAL
  Filled 2013-05-09 (×3): qty 2

## 2013-05-09 MED ORDER — PHENYLEPHRINE 40 MCG/ML (10ML) SYRINGE FOR IV PUSH (FOR BLOOD PRESSURE SUPPORT)
PREFILLED_SYRINGE | INTRAVENOUS | Status: AC
  Filled 2013-05-09: qty 10

## 2013-05-09 MED ORDER — FUROSEMIDE 20 MG PO TABS
20.0000 mg | ORAL_TABLET | Freq: Every day | ORAL | Status: DC
  Administered 2013-05-09 – 2013-05-11 (×3): 20 mg via ORAL
  Filled 2013-05-09 (×3): qty 1

## 2013-05-09 MED ORDER — GLYCOPYRROLATE 0.2 MG/ML IJ SOLN
INTRAMUSCULAR | Status: DC | PRN
  Administered 2013-05-09: 0.4 mg via INTRAVENOUS

## 2013-05-09 MED ORDER — PROPRANOLOL HCL 1 MG/ML IV SOLN
INTRAVENOUS | Status: AC
  Filled 2013-05-09: qty 1

## 2013-05-09 MED ORDER — ACETAMINOPHEN 650 MG RE SUPP: 650.0000 mg | RECTAL | Status: DC | PRN

## 2013-05-09 MED ORDER — SODIUM CHLORIDE 0.9 % IV SOLN
250.0000 mL | INTRAVENOUS | Status: DC
Start: 2013-05-09 — End: 2013-05-11

## 2013-05-09 MED ORDER — STERILE WATER FOR INJECTION IJ SOLN
INTRAMUSCULAR | Status: AC
  Filled 2013-05-09: qty 10

## 2013-05-09 MED ORDER — SODIUM CHLORIDE 0.9 % IJ SOLN
3.0000 mL | Freq: Two times a day (BID) | INTRAMUSCULAR | Status: DC
  Administered 2013-05-09 – 2013-05-11 (×3): 3 mL via INTRAVENOUS

## 2013-05-09 MED ORDER — ATORVASTATIN CALCIUM 80 MG PO TABS
80.0000 mg | ORAL_TABLET | Freq: Every day | ORAL | Status: DC
  Administered 2013-05-09 – 2013-05-10 (×2): 80 mg via ORAL
  Filled 2013-05-09 (×3): qty 1

## 2013-05-09 MED ORDER — PROPOFOL 10 MG/ML IV BOLUS
INTRAVENOUS | Status: DC | PRN
  Administered 2013-05-09: 200 mg via INTRAVENOUS

## 2013-05-09 MED ORDER — PHENOL 1.4 % MT LIQD
1.0000 | OROMUCOSAL | Status: DC | PRN
Start: 2013-05-09 — End: 2013-05-11
  Administered 2013-05-11: 1 via OROMUCOSAL
  Filled 2013-05-09: qty 177

## 2013-05-09 MED ORDER — PHENYLEPHRINE HCL 10 MG/ML IJ SOLN
INTRAMUSCULAR | Status: DC | PRN
  Administered 2013-05-09: 80 ug via INTRAVENOUS

## 2013-05-09 MED ORDER — GLYCOPYRROLATE 0.2 MG/ML IJ SOLN
INTRAMUSCULAR | Status: AC
  Filled 2013-05-09: qty 3

## 2013-05-09 MED ORDER — 0.9 % SODIUM CHLORIDE (POUR BTL) OPTIME
TOPICAL | Status: DC | PRN
  Administered 2013-05-09: 1000 mL

## 2013-05-09 MED ORDER — PANTOPRAZOLE SODIUM 40 MG PO TBEC
40.0000 mg | DELAYED_RELEASE_TABLET | Freq: Every day | ORAL | Status: DC
  Administered 2013-05-09 – 2013-05-11 (×3): 40 mg via ORAL
  Filled 2013-05-09 (×3): qty 1

## 2013-05-09 MED ORDER — PHENYLEPHRINE HCL 10 MG/ML IJ SOLN
10.0000 mg | INTRAVENOUS | Status: DC | PRN
  Administered 2013-05-09: 20 ug/min via INTRAVENOUS

## 2013-05-09 MED ORDER — LIDOCAINE HCL (CARDIAC) 20 MG/ML IV SOLN
INTRAVENOUS | Status: DC | PRN
  Administered 2013-05-09: 80 mg via INTRAVENOUS

## 2013-05-09 MED ORDER — CEFAZOLIN SODIUM 1-5 GM-% IV SOLN
1.0000 g | Freq: Three times a day (TID) | INTRAVENOUS | Status: AC
  Administered 2013-05-09 – 2013-05-10 (×2): 1 g via INTRAVENOUS
  Filled 2013-05-09 (×2): qty 50

## 2013-05-09 MED ORDER — PROPOFOL 10 MG/ML IV BOLUS
INTRAVENOUS | Status: AC
  Filled 2013-05-09: qty 20

## 2013-05-09 MED ORDER — SODIUM CHLORIDE 0.9 % IJ SOLN: 3.0000 mL | INTRAMUSCULAR | Status: DC | PRN

## 2013-05-09 MED ORDER — DOXYCYCLINE HYCLATE 100 MG PO TABS
100.0000 mg | ORAL_TABLET | Freq: Two times a day (BID) | ORAL | Status: DC
  Administered 2013-05-09 – 2013-05-11 (×4): 100 mg via ORAL
  Filled 2013-05-09 (×6): qty 1

## 2013-05-09 MED ORDER — ARTIFICIAL TEARS OP OINT
TOPICAL_OINTMENT | OPHTHALMIC | Status: DC | PRN
  Administered 2013-05-09: 1 via OPHTHALMIC

## 2013-05-09 MED ORDER — NEOSTIGMINE METHYLSULFATE 1 MG/ML IJ SOLN
INTRAMUSCULAR | Status: AC
  Filled 2013-05-09: qty 10

## 2013-05-09 MED ORDER — ALBUMIN HUMAN 5 % IV SOLN
INTRAVENOUS | Status: DC | PRN
  Administered 2013-05-09: 10:00:00 via INTRAVENOUS

## 2013-05-09 MED ORDER — ALPRAZOLAM 0.5 MG PO TABS
0.5000 mg | ORAL_TABLET | Freq: Once | ORAL | Status: AC
Start: 2013-05-09 — End: 2013-05-09
  Administered 2013-05-09: 0.5 mg via ORAL
  Filled 2013-05-09: qty 1

## 2013-05-09 MED ORDER — THROMBIN 5000 UNITS EX SOLR
OROMUCOSAL | Status: DC | PRN
  Administered 2013-05-09: 09:00:00 via TOPICAL

## 2013-05-09 MED ORDER — LISINOPRIL 2.5 MG PO TABS
2.5000 mg | ORAL_TABLET | Freq: Every day | ORAL | Status: DC
  Administered 2013-05-10 – 2013-05-11 (×2): 2.5 mg via ORAL
  Filled 2013-05-09 (×2): qty 1

## 2013-05-09 MED ORDER — ONDANSETRON HCL 4 MG/2ML IJ SOLN: 4.0000 mg | Freq: Once | INTRAMUSCULAR | Status: DC | PRN

## 2013-05-09 MED ORDER — ARTIFICIAL TEARS OP OINT
TOPICAL_OINTMENT | OPHTHALMIC | Status: AC
  Filled 2013-05-09: qty 3.5

## 2013-05-09 SURGICAL SUPPLY — 79 items
APL SKNCLS STERI-STRIP NONHPOA (GAUZE/BANDAGES/DRESSINGS) ×1
BANDAGE GAUZE ELAST BULKY 4 IN (GAUZE/BANDAGES/DRESSINGS) ×6 IMPLANT
BENZOIN TINCTURE PRP APPL 2/3 (GAUZE/BANDAGES/DRESSINGS) ×3 IMPLANT
BIT DRILL SM SPINE QC 14 (BIT) ×2 IMPLANT
BLADE ULTRA TIP 2M (BLADE) ×3 IMPLANT
BUR BARREL STRAIGHT FLUTE 4.0 (BURR) IMPLANT
BUR MATCHSTICK NEURO 3.0 LAGG (BURR) ×3 IMPLANT
CANISTER SUCT 3000ML (MISCELLANEOUS) ×3 IMPLANT
CLOSURE WOUND 1/2 X4 (GAUZE/BANDAGES/DRESSINGS) ×1
CONT SPEC 4OZ CLIKSEAL STRL BL (MISCELLANEOUS) ×3 IMPLANT
COVER MAYO STAND STRL (DRAPES) ×3 IMPLANT
DRAIN CHANNEL 10M FLAT 3/4 FLT (DRAIN) ×2 IMPLANT
DRAPE C-ARM 42X72 X-RAY (DRAPES) ×6 IMPLANT
DRAPE LAPAROTOMY 100X72 PEDS (DRAPES) ×3 IMPLANT
DRAPE MICROSCOPE LEICA (MISCELLANEOUS) ×3 IMPLANT
DRAPE POUCH INSTRU U-SHP 10X18 (DRAPES) ×3 IMPLANT
DRAPE PROXIMA HALF (DRAPES) IMPLANT
DURAPREP 6ML APPLICATOR 50/CS (WOUND CARE) ×3 IMPLANT
ELECT REM PT RETURN 9FT ADLT (ELECTROSURGICAL) ×3
ELECTRODE REM PT RTRN 9FT ADLT (ELECTROSURGICAL) ×1 IMPLANT
EVACUATOR SILICONE 100CC (DRAIN) ×2 IMPLANT
GAUZE SPONGE 4X4 16PLY XRAY LF (GAUZE/BANDAGES/DRESSINGS) IMPLANT
GLOVE BIO SURGEON STRL SZ 6.5 (GLOVE) IMPLANT
GLOVE BIO SURGEON STRL SZ7 (GLOVE) IMPLANT
GLOVE BIO SURGEON STRL SZ7.5 (GLOVE) IMPLANT
GLOVE BIO SURGEON STRL SZ8 (GLOVE) IMPLANT
GLOVE BIO SURGEON STRL SZ8.5 (GLOVE) IMPLANT
GLOVE BIO SURGEONS STRL SZ 6.5 (GLOVE)
GLOVE BIOGEL M 8.0 STRL (GLOVE) ×3 IMPLANT
GLOVE ECLIPSE 6.5 STRL STRAW (GLOVE) IMPLANT
GLOVE ECLIPSE 7.0 STRL STRAW (GLOVE) IMPLANT
GLOVE ECLIPSE 7.5 STRL STRAW (GLOVE) IMPLANT
GLOVE ECLIPSE 8.0 STRL XLNG CF (GLOVE) ×2 IMPLANT
GLOVE ECLIPSE 8.5 STRL (GLOVE) IMPLANT
GLOVE EXAM NITRILE LRG STRL (GLOVE) ×2 IMPLANT
GLOVE EXAM NITRILE MD LF STRL (GLOVE) IMPLANT
GLOVE EXAM NITRILE XL STR (GLOVE) IMPLANT
GLOVE EXAM NITRILE XS STR PU (GLOVE) IMPLANT
GLOVE INDICATOR 6.5 STRL GRN (GLOVE) IMPLANT
GLOVE INDICATOR 7.0 STRL GRN (GLOVE) ×2 IMPLANT
GLOVE INDICATOR 7.5 STRL GRN (GLOVE) IMPLANT
GLOVE INDICATOR 8.0 STRL GRN (GLOVE) IMPLANT
GLOVE INDICATOR 8.5 STRL (GLOVE) IMPLANT
GLOVE OPTIFIT SS 8.0 STRL (GLOVE) IMPLANT
GLOVE SURG SS PI 6.5 STRL IVOR (GLOVE) IMPLANT
GLOVE SURG SS PI 7.0 STRL IVOR (GLOVE) ×6 IMPLANT
GOWN BRE IMP SLV AUR LG STRL (GOWN DISPOSABLE) ×1 IMPLANT
GOWN BRE IMP SLV AUR XL STRL (GOWN DISPOSABLE) IMPLANT
GOWN STRL REIN 2XL LVL4 (GOWN DISPOSABLE) IMPLANT
GOWN STRL REUS W/ TWL LRG LVL3 (GOWN DISPOSABLE) IMPLANT
GOWN STRL REUS W/TWL LRG LVL3 (GOWN DISPOSABLE) ×3
GOWN STRL REUS W/TWL MED LVL3 (GOWN DISPOSABLE) ×2 IMPLANT
HALTER HD/CHIN CERV TRACTION D (MISCELLANEOUS) ×1 IMPLANT
HEMOSTAT POWDER KIT SURGIFOAM (HEMOSTASIS) ×2 IMPLANT
KIT BASIN OR (CUSTOM PROCEDURE TRAY) ×3 IMPLANT
KIT ROOM TURNOVER OR (KITS) ×3 IMPLANT
NDL SPNL 22GX3.5 QUINCKE BK (NEEDLE) ×1 IMPLANT
NEEDLE SPNL 22GX3.5 QUINCKE BK (NEEDLE) ×3 IMPLANT
NS IRRIG 1000ML POUR BTL (IV SOLUTION) ×3 IMPLANT
PACK LAMINECTOMY NEURO (CUSTOM PROCEDURE TRAY) ×3 IMPLANT
PATTIES SURGICAL .5 X1 (DISPOSABLE) ×3 IMPLANT
PLATE ANT CERV XTEND 3 LV 45 (Plate) ×2 IMPLANT
PUTTY DBX 1CC (Putty) ×3 IMPLANT
PUTTY DBX 1CC DEPUY (Putty) IMPLANT
RUBBERBAND STERILE (MISCELLANEOUS) ×6 IMPLANT
SCREW XTD VAR 4.2 SELF TAP (Screw) ×14 IMPLANT
SCREW XTEND SELFTAP VAR 4.6X14 (Screw) ×2 IMPLANT
SPACER ACDF SM LORDOTIC 7 (Spacer) ×2 IMPLANT
SPACER COLONIAL SZ 6-7 (Spacer) ×2 IMPLANT
SPACER COLONIAL SZ 9-11X12-7 (Spacer) ×2 IMPLANT
SPONGE GAUZE 4X4 12PLY (GAUZE/BANDAGES/DRESSINGS) ×3 IMPLANT
SPONGE INTESTINAL PEANUT (DISPOSABLE) ×6 IMPLANT
SPONGE SURGIFOAM ABS GEL 100 (HEMOSTASIS) ×3 IMPLANT
STRIP CLOSURE SKIN 1/2X4 (GAUZE/BANDAGES/DRESSINGS) ×2 IMPLANT
SUT VIC AB 3-0 SH 8-18 (SUTURE) ×3 IMPLANT
SYR 20ML ECCENTRIC (SYRINGE) ×3 IMPLANT
TOWEL OR 17X24 6PK STRL BLUE (TOWEL DISPOSABLE) ×3 IMPLANT
TOWEL OR 17X26 10 PK STRL BLUE (TOWEL DISPOSABLE) ×3 IMPLANT
WATER STERILE IRR 1000ML POUR (IV SOLUTION) ×3 IMPLANT

## 2013-05-09 NOTE — Progress Notes (Signed)
Utilization review completed.  

## 2013-05-09 NOTE — Preoperative (Signed)
Beta Blockers   Reason not to administer Beta Blockers:Not Applicable 

## 2013-05-09 NOTE — Anesthesia Procedure Notes (Signed)
Procedure Name: Intubation Date/Time: 05/09/2013 8:53 AM Performed by: Erik Obey Pre-anesthesia Checklist: Patient identified, Patient being monitored, Emergency Drugs available, Timeout performed and Suction available Patient Re-evaluated:Patient Re-evaluated prior to inductionOxygen Delivery Method: Circle system utilized Preoxygenation: Pre-oxygenation with 100% oxygen Intubation Type: IV induction Ventilation: Mask ventilation without difficulty and Oral airway inserted - appropriate to patient size Laryngoscope Size: Mac and 3 Grade View: Grade I Tube type: Oral Tube size: 7.5 mm Number of attempts: 1 Airway Equipment and Method: Stylet and Oral airway Placement Confirmation: ETT inserted through vocal cords under direct vision,  positive ETCO2 and breath sounds checked- equal and bilateral Secured at: 23 cm Tube secured with: Tape Dental Injury: Teeth and Oropharynx as per pre-operative assessment

## 2013-05-09 NOTE — Anesthesia Preprocedure Evaluation (Signed)
Anesthesia Evaluation  Patient identified by MRN, date of birth, ID band Patient awake    Reviewed: Allergy & Precautions, H&P , NPO status , Patient's Chart, lab work & pertinent test results  Airway       Dental   Pulmonary asthma ,          Cardiovascular hypertension, + Peripheral Vascular Disease + dysrhythmias     Neuro/Psych  Neuromuscular disease    GI/Hepatic GERD-  ,  Endo/Other  diabetes, Type 2, Oral Hypoglycemic Agents  Renal/GU Renal disease     Musculoskeletal   Abdominal   Peds  Hematology  (+) anemia ,   Anesthesia Other Findings   Reproductive/Obstetrics                           Anesthesia Physical Anesthesia Plan  ASA: III  Anesthesia Plan: General   Post-op Pain Management:    Induction: Intravenous  Airway Management Planned: Oral ETT  Additional Equipment:   Intra-op Plan:   Post-operative Plan: Extubation in OR  Informed Consent: I have reviewed the patients History and Physical, chart, labs and discussed the procedure including the risks, benefits and alternatives for the proposed anesthesia with the patient or authorized representative who has indicated his/her understanding and acceptance.     Plan Discussed with:   Anesthesia Plan Comments:         Anesthesia Quick Evaluation

## 2013-05-09 NOTE — Op Note (Signed)
NAMESHISHIR, KRANTZ NO.:  [id number]  MEDICAL RECORD NO.:  [id number]  LOCATION:  [id number]                        FACILITY:  [facility]  PHYSICIAN:  Leeroy Cha, M.D.   DATE OF BIRTH:  [birth year]  DATE OF PROCEDURE:  05/09/2013 DATE OF DISCHARGE:                              OPERATIVE REPORT   PREOPERATIVE DIAGNOSES:  Degenerative disk disease with chronic radiculopathy at the level of cervical 3-4 and 5-6.  Chronic radiculopathy involving the C4, C5 and C6 nerve root.  Borderline disk at the level of 4-5.  Replacement of the left shoulder.  Chronic intake of Coumadin after the bypass.  POSTOPERATIVE DIAGNOSES:  Degenerative disk disease with chronic radiculopathy at the level of cervical 3-4 and 5-6.  Chronic radiculopathy involving the C4, C5 and C6 nerve root.  Borderline disk at the level of 4-5.  Replacement of the left shoulder. Chronic intake of Coumadin after the bypass.  PROCEDURES:  Anterior cervical 3-4, 4-5, and 5-6 diskectomy, decompression of the spinal cord, bilateral foraminotomy, interbody fusion with cages, plate from G6-Y4.  Microscope.  SURGEON:  Leeroy Cha, M.D.  ASSISTANT:  Faythe Ghee, M.D.  CLINICAL HISTORY:  Mr. Bozzo is a gentleman who had been complaining of neck pain with radiation to the left upper extremity for several months.  He had problem with the shoulder.  He has two surgical interventions, the last one, replaced the shoulder joint.  Nevertheless, he continued with the pain and x-ray showed that he had a severe case of degenerative disk disease with stenosis at the level of 3-4 and 5-6 and the level between 4-5 borderline normal.  I talked to him at length in the office.  At the end, we agreed with surgery.  He knew that for sure we are going to proceed at the level of 3-4 and 5-6, but probably to avoid disk in between, we are going to involve C4-C5.  The patient has lot of pain going to the shoulder and also  this could be also part of the C5 radiculopathy.  The patient knew the risk with the surgery including hematoma because his chronic intake of Coumadin.  DESCRIPTION OF PROCEDURE:  The patient was taken to the OR, and after intubation, the neck was cleaned with DuraPrep and drapes were applied. Transverse incision was made through the skin, subcutaneous tissue, and straight to cervical area.  X-ray showed that the needles were at the level of 4-5 and 5-6.  From then on, we started at the level of C3-C4 where the patient has quite a bit of large osteophyte, which was removed.  The disk was quite degenerative and with the help of the microscope, we went straight to the posterior ligament.  This was thin, partial calcified.  Decompression was done.  There were more stenosis in the left side than the right side.  At the level of L4-5, we found spondylosis with so much stenosis, not traumatic at the level of L3-4. At that level, we decompressed the foramen bilaterally.  There was more marrow in the left than the right side.  At the level of 5-6, he has quite a bit of degenerative disk disease up to the point  that we had to use the drill to go all the way to the posterior ligament.  This ligament was opened and decompression of both C6 nerve root was achieved.  From then on at the level of C3-C4 cage of 6-mm height lordotic with autograft and DBX was inserted.  At the level of 4-5, it was 7-mm and at the level of 5-6, was 9-mm.  Then, a plate from Q0-H4 was done using eight screws.  Lateral cervical spine x-rays showed good position of the plate and the screws.  From then on, the area was irrigated.  Although, we accomplished hemostasis because of the history of Coumadin intake, we left a drain.  The wound was closed with Vicryl and Steri-Strip.  At the end of the procedure, the drape was removed and there was quite a bit of opening of the epidermidis at the skin.  He has some area of  small vesicles, full of blood.  This might be coming from the IO line.  The area was cleaned.  The patient is going to be transfer to PACU.  By the time, he will walk, he was moving all four extremities.          ______________________________ Leeroy Cha, M.D.     EB/MEDQ  D:  05/09/2013  T:  05/09/2013  Job:  [id number]

## 2013-05-09 NOTE — Anesthesia Postprocedure Evaluation (Signed)
  Anesthesia Post-op Note  Patient: [name]  Procedure(s) Performed: Procedure(s): ANTERIOR CERVICAL DECOMPRESSION/DISCECTOMY FUSION CERVICAL THREE-FOUR,CERVICAL FOUR-FIVE,CERVICAL FIVE-SIX (N/A)  Patient Location: PACU  Anesthesia Type:General  Level of Consciousness: awake, oriented, sedated and patient cooperative  Airway and Oxygen Therapy: Patient Spontanous Breathing  Post-op Pain: mild  Post-op Assessment: Post-op Vital signs reviewed, Patient's Cardiovascular Status Stable, Respiratory Function Stable, Patent Airway, No signs of Nausea or vomiting and Pain level controlled  Post-op Vital Signs: stable  Complications: No apparent anesthesia complications

## 2013-05-09 NOTE — Progress Notes (Signed)
Patient ID: [name], male   DOB: 30-Aug-1940, 73 y.o.   MRN: [id number] Stable, no weakness. Drain working well. Some pain to swallow. Skin around the sholuders with no more bleeding. He and family aware that i will be out of town and my partners will cover for me

## 2013-05-09 NOTE — Transfer of Care (Signed)
Immediate Anesthesia Transfer of Care Note  Patient: [name]  Procedure(s) Performed: Procedure(s): ANTERIOR CERVICAL DECOMPRESSION/DISCECTOMY FUSION CERVICAL THREE-FOUR,CERVICAL FOUR-FIVE,CERVICAL FIVE-SIX (N/A)  Patient Location: PACU  Anesthesia Type:General  Level of Consciousness: awake, alert  and oriented  Airway & Oxygen Therapy: Patient Spontanous Breathing and Patient connected to nasal cannula oxygen  Post-op Assessment: Report given to PACU RN, Post -op Vital signs reviewed and stable and Patient moving all extremities X 4  Post vital signs: Reviewed and stable  Complications: No apparent anesthesia complications

## 2013-05-09 NOTE — OR Nursing (Signed)
When ioban removed from surgical site several skin tears noted with bleeding to both upper chest and left shoulder Dr Joya Salm and Dr Tamala Julian in or suite to see skin tears, order to apply nonadhering dressing telfa and paper tape

## 2013-05-09 NOTE — Progress Notes (Signed)
Pt choked on water, called nurse into room very anxious. HR elevated to 112, sweating, with increased shallow respirations of 22. On call notified, new order received and carried out.Pt has since been able to take medicines crushed in applesauce and sips of water with no difficulty. Will continue to monitor. Verdie Drown RN BSN

## 2013-05-10 ENCOUNTER — Encounter (HOSPITAL_COMMUNITY): Payer: Self-pay | Admitting: Neurosurgery

## 2013-05-10 LAB — GLUCOSE, CAPILLARY
GLUCOSE-CAPILLARY: 206 mg/dL — AB (ref 70–99)
Glucose-Capillary: 171 mg/dL — ABNORMAL HIGH (ref 70–99)
Glucose-Capillary: 193 mg/dL — ABNORMAL HIGH (ref 70–99)
Glucose-Capillary: 157 mg/dL — ABNORMAL HIGH (ref 70–99)

## 2013-05-10 MED ORDER — ALPRAZOLAM 0.5 MG PO TABS
0.5000 mg | ORAL_TABLET | Freq: Two times a day (BID) | ORAL | Status: DC | PRN
  Administered 2013-05-10: 0.5 mg via ORAL
  Filled 2013-05-10: qty 1

## 2013-05-10 NOTE — Evaluation (Signed)
Occupational Therapy Evaluation Patient Details Name: [name] MRN: [id number] DOB: 1940/04/02 Today's Date: 05/10/2013    History of Present Illness patient is a pleasent 73 yo male with history of left shoulder arthroplasty and now presents to therapy s/p ANTERIOR CERVICAL DECOMPRESSION/DISCECTOMY FUSION CERVICAL THREE-FOUR,CERVICAL FOUR-FIVE,CERVICAL FIVE-SIX.    Clinical Impression   Patient seen for OT to assess his ability to return home, and return to his OP ot to continue to improver function oin left UE after shoulder hemiarthroplasty.  Patient understands cervical precautions, and able to demonstrate during basic self care skills, and functional mobility.  Written instructions given to patient.  Patient eager to return home with wife who will be available 24/7 for next 6 days (spring break.)  No further IP OT warranted at this time.      Follow Up Recommendations  Outpatient OT    Equipment Recommendations  None recommended by OT    Recommendations for Other Services       Precautions / Restrictions Precautions Precautions: Fall;Cervical Restrictions Weight Bearing Restrictions: No      Mobility Bed Mobility Overal bed mobility: Modified Independent                Transfers Overall transfer level: Modified independent Equipment used: None Transfers: Sit to/from [employer] Transfers Sit to Stand: Modified independent (Device/Increase time) Stand pivot transfers: Modified independent (Device/Increase time)       General transfer comment: moves slowly, cautiously - protective of neck - guarding    Balance Overall balance assessment: Modified Independent                                  ADL                     General ADL Comments: able to follow cervical precautions after initial instruction     Vision                     Perception Perception Perception Tested?: No   Praxis Praxis Praxis tested?:  Within functional limits    Pertinent Vitals/Pain VSS, Pain - skin tears in neck - 6-7, repositioned patient in chair     Hand Dominance Right   Extremity/Trunk Assessment Upper Extremity Assessment Upper Extremity Assessment: LUE deficits/detail LUE Deficits / Details: Limited shoulder flexion and abduction due to recent hemiarthroplasty actively working in therapy prior to surgery LUE Sensation: decreased light touch (pins and needles - not new problem) LUE Coordination: decreased gross motor       Cervical / Trunk Assessment Cervical / Trunk Assessment:  (post surgical cervical precations followed)   Communication Communication Communication: No difficulties   Cognition Arousal/Alertness: Awake/alert Behavior During Therapy: WFL for tasks assessed/performed Overall Cognitive Status: Within Functional Limits for tasks assessed                     General Comments       Exercises      Home Living Family/patient expects to be discharged to:: Private residence Living Arrangements: Spouse/significant other;Children Available Help at Discharge: Family;Available 24 hours/day Type of Home: Mobile home Home Access: Stairs to enter Entrance Stairs-Number of Steps: 3 Entrance Stairs-Rails: Right Home Layout: One level     Bathroom Shower/Tub: Walk-in shower;Door   [employer] Toilet: Standard     Home Equipment: Environmental consultant - 2 wheels;Cane - single point;Other (comment);Shower seat - built in (head  and foot of bed adjustable, sleeps in recliner)          Prior Functioning/Environment Level of Independence: Independent             OT Diagnosis:  acute pain, weakness   OT Problem List: Pain;Impaired sensation;Impaired UE functional use;Decreased range of motion;Decreased strength   OT Treatment/Interventions:   Eval and discharge   OT Goals(Current goals can be found in the care plan section) Acute Rehab OT Goals Patient Stated Goal: to go home tomorrow  OT  Frequency:     Barriers to D/C:    none       End of Session:    Activity Tolerance: Patient tolerated treatment well Patient left: in chair;with call bell/phone within reach   Time: 1443-1540 OT Time Calculation (min): 30 min Charges:  OT General Charges $OT Visit: 1 Procedure OT Evaluation $Initial OT Evaluation Tier I: 1 Procedure OT Treatments $Self Care/Home Management : 8-22 mins $Therapeutic Exercise: 8-22 mins G-Codes:    [name] 05-Jun-2013, 1:56 PM

## 2013-05-10 NOTE — Progress Notes (Signed)
During the course of post-surgical dressing removal, patient sustained alterations in skin integrity.  MD aware.  Ordered wound care consult.  Provided support to patient and medicated for discomfort.  Will continue to monitor patient.

## 2013-05-10 NOTE — Progress Notes (Signed)
Patient ID: [name], male   DOB: 1940-06-05, 73 y.o.   MRN: [id number] Afeb, vss Arm/shoulder pain markedly improved. Biggest issue is skin tearing from his thin skin. Will get wound care nurse to advise. Expects to go home tomorrow.

## 2013-05-10 NOTE — Consult Note (Signed)
WOC wound consult note Reason for Consult:Multiple areas of MARSI (medical adhesive related skin injury) at bilateral and medial neck resulting from surgical dressing change.  Area of greatest severity is located at the right clavicle Wound type: MARSI Pressure Ulcer POA: No Measurement: Right clavicle:  4.5 x 3 x 0.2cm, medial neck:  3cm x 2cm x 0.2cm and 3cm x 0.5cm x 0.2cm  left clavicle: 3cm x 0.2cm x 0.2cm  Wound bed: red, moist  Drainage (amount, consistency, odor) serous exudate Periwound: intact with petechiae and ecchymosis Dressing procedure/placement/frequency: Patient is very uncomfortable and has been medicated just prior to my arrival.  I will implement a silicone wound contact layer (Mepitel, Kellie Simmering [phone]) as it will not adhere and allows for atraumatic dressing changes.  I will use a folded 4x4 inch gauze square on top of the Mepitel to absorb exudate and write for changes every M-W-F. [facility] nursing team will not follow routinely, but will remain available to this patient, the nursing and medical teams.  Please re-consult if needed. Thanks, Maudie Flakes, MSN, RN, [place], [place], [place] [phone])

## 2013-05-10 NOTE — Evaluation (Signed)
Physical Therapy Evaluation Patient Details Name: [name] MRN: [id number] DOB: 12-01-1940 Today's Date: 05/10/2013   History of Present Illness  patient is a pleasent 73 yo male with history of left shoulder arthroplasty and now presents to therapy s/p ANTERIOR CERVICAL DECOMPRESSION/DISCECTOMY FUSION CERVICAL THREE-FOUR,CERVICAL FOUR-FIVE,CERVICAL FIVE-SIX.   Clinical Impression  Patient demonstrates deficits as indicated below. Will benefit from continued skilled PT to address deficits and maximize function. Will see as indicated and progress as tolerated.     Follow Up Recommendations No PT follow up;Supervision - Intermittent    Equipment Recommendations  None recommended by PT    Recommendations for Other Services       Precautions / Restrictions Precautions Precautions: Cervical Precaution Comments: educated patient on precautions and mobility restrictions with cervical spine Required Braces or Orthoses:  (none indicated at this time)      Mobility  Bed Mobility Overal bed mobility: Modified Independent             General bed mobility comments: Cues given for mobility and cervical precautions when getting OOB, patient performed without difficulty  Transfers Overall transfer level: Needs assistance Equipment used: None Transfers: Sit to/from Stand Sit to Stand: Min guard;Supervision         General transfer comment: Some initial instability when standing, patient somewhat impulsive when attempting to sit.  VCs for safety  Ambulation/Gait Ambulation/Gait assistance: Min guard;Supervision Ambulation Distance (Feet): 180 Feet Assistive device: None Gait Pattern/deviations: Decreased stride length;Step-through pattern Gait velocity: cautiously guarded with gait Gait velocity interpretation: Below normal speed for age/gender General Gait Details: patient very cautious with gait, appears to be distracted by pain for skin tears limiting his ability to  mobilize   Stairs Stairs: Yes Stairs assistance: Min guard Stair Management: Two rails;Step to pattern;Forwards (blind performance using feet for sensory input ) Number of Stairs: 5 (x2) General stair comments: performed via blind technique to teach patient how to use sesnory input rather then visual input to navigate stairs secondary to cervical precautions  Wheelchair Mobility    Modified Rankin (Stroke Patients Only)       Balance                                     Pertinent Vitals/Pain 4/10 prior to activity, during session (skin tear when MD removed bandage) causing 8/10 pain from open wounds    Home Living Family/patient expects to be discharged to:: Private residence Living Arrangements: Spouse/significant other;Children Available Help at Discharge: Family;Available 24 hours/day Type of Home: Mobile home Home Access: Stairs to enter Entrance Stairs-Rails: Right Entrance Stairs-Number of Steps: 3 Home Layout: One level Home Equipment: Walker - 2 wheels;Cane - single point      Prior Function Level of Independence: Independent               Hand Dominance   Dominant Hand: Right    Extremity/Trunk Assessment   Upper Extremity Assessment: Defer to OT evaluation (limited range LUE )           Lower Extremity Assessment: Overall WFL for tasks assessed         Communication   Communication: No difficulties  Cognition Arousal/Alertness: Awake/alert Behavior During Therapy: WFL for tasks assessed/performed Overall Cognitive Status: Within Functional Limits for tasks assessed                      General Comments General  comments (skin integrity, edema, etc.): during session MD came into room and removed bandage, significant amounts of skin tearing occurred. This PT recommended vasiline dressing temporarily until wound care nurse could consult. MD in agreement. Applied dressing to multiple areas of skin tear (xeroform  dressin).  Nsg advised and wound care nurse to consult this am.    Exercises        Assessment/Plan    PT Assessment Patient needs continued PT services  PT Diagnosis Difficulty walking;Acute pain   PT Problem List Decreased activity tolerance;Decreased balance;Decreased mobility;Decreased skin integrity;Pain  PT Treatment Interventions DME instruction;Gait training;Stair training;Functional mobility training;Therapeutic activities;Therapeutic exercise;Balance training;Patient/family education   PT Goals (Current goals can be found in the Care Plan section) Acute Rehab PT Goals Patient Stated Goal: to go home tomorrow PT Goal Formulation: With patient Time For Goal Achievement: 05/24/13 Potential to Achieve Goals: Good    Frequency Min 5X/week   Barriers to discharge        End of Session Equipment Utilized During Treatment: Gait belt Activity Tolerance: Patient limited by fatigue;Patient limited by pain Patient left: in chair;with call bell/phone within reach;with chair alarm set         Time: 0258-5277 PT Time Calculation (min): 31 min   Charges:   PT Evaluation $Initial PT Evaluation Tier I: 1 Procedure PT Treatments $Gait Training: 8-22 mins $Self Care/Home Management: 8-22   PT G CodesDuncan Dull 05/10/2013, 11:32 AM Alben Deeds, PT DPT  [phone]

## 2013-05-10 NOTE — Clinical Social Work Note (Signed)
CSW received consult for possible SNF placement once pt is medically stable for discharge from [facility]. PT/OT to evaluate pt and recommend discharge disposition. CSW to continue to follow pt's chart for PT/OT recommendations.  Pati Gallo, [place] Social Worker [phone]

## 2013-05-11 LAB — GLUCOSE, CAPILLARY
GLUCOSE-CAPILLARY: 165 mg/dL — AB (ref 70–99)
GLUCOSE-CAPILLARY: 175 mg/dL — AB (ref 70–99)
GLUCOSE-CAPILLARY: 151 mg/dL — AB (ref 70–99)

## 2013-05-11 MED ORDER — OXYCODONE-ACETAMINOPHEN 5-325 MG PO TABS: 1.0000 | ORAL_TABLET | ORAL | Status: DC | PRN

## 2013-05-11 MED ORDER — ALPRAZOLAM 0.5 MG PO TABS: 0.5000 mg | ORAL_TABLET | Freq: Two times a day (BID) | ORAL | Status: DC | PRN

## 2013-05-11 NOTE — Progress Notes (Signed)
Patient wanted to know when the MD can D/C him. Writer called MD's office and was told message will be passed to the MD.Patient was made aware.

## 2013-05-11 NOTE — Progress Notes (Signed)
Patient's wife voiced concern that she needed the MD to D/c patient. MD's office called again, was said that the covering MD is still in surgery. Will let the patient and family know.

## 2013-05-11 NOTE — Progress Notes (Signed)
Physical Therapy Treatment Patient Details Name: [name] MRN: [id number] DOB: [birth year] Today's Date: 05/11/2013    History of Present Illness patient is a pleasent 73 yo male with history of left shoulder arthroplasty and now presents to therapy s/p ANTERIOR CERVICAL DECOMPRESSION/DISCECTOMY FUSION CERVICAL THREE-FOUR,CERVICAL FOUR-FIVE,CERVICAL FIVE-SIX.     PT Comments    Progressing well with mobility. No LOB during session. Pt did well adhering to precautions. Tolerated activity well. Pt is planning to d/c home today, if able.   Follow Up Recommendations  No PT follow up;Supervision - Intermittent     Equipment Recommendations  None recommended by PT    Recommendations for Other Services       Precautions / Restrictions Precautions Precautions: Fall;Cervical Restrictions Weight Bearing Restrictions: No    Mobility  Bed Mobility               General bed mobility comments: pt sitting in reclliner  Transfers Overall transfer level: Modified independent Equipment used: None Transfers: Sit to/from Stand              Ambulation/Gait Ambulation/Gait assistance: Supervision Ambulation Distance (Feet): 300 Feet Assistive device: None Gait Pattern/deviations: Step-through pattern     General Gait Details: Pt intertmittently reached out for handrail. No LOB. slow gait speed.    Stairs            Wheelchair Mobility    Modified Rankin (Stroke Patients Only)       Balance                                    Cognition Arousal/Alertness: Awake/alert Behavior During Therapy: WFL for tasks assessed/performed Overall Cognitive Status: Within Functional Limits for tasks assessed                      Exercises      General Comments        Pertinent Vitals/Pain "crick" in neck from sleeping position last night, discomfort from skin tears on anterior neck 5/10    Home Living                       Prior Function            PT Goals (current goals can now be found in the care plan section) Progress towards PT goals: Progressing toward goals    Frequency       PT Plan Current plan remains appropriate    Co-evaluation             End of Session   Activity Tolerance: Patient tolerated treatment well Patient left: in chair;with call bell/phone within reach     Time: 1024-1040 PT Time Calculation (min): 16 min  Charges:  $Gait Training: 8-22 mins                    G Codes:      Weston Anna, MPT Pager: [phone]

## 2013-05-11 NOTE — Discharge Summary (Signed)
Physician Discharge Summary  Patient ID: [name] MRN: [id number] DOB/AGE: [birth year] 72 y.o.  Admit date: 05/09/2013 Discharge date: 05/11/2013  Admission Diagnoses:  Discharge Diagnoses:  Active Problems:   Cervical stenosis of spinal canal   Discharged Condition: good  Hospital Course: Surgery Tuesday for 3 level acdf. Did well post op. Increased activity well. Ambulated without difficulty. Major issue was skin tears secondary to surgical tape, etc. Wound care nurse consulted for her help. Home pod 2, specific instructions given.  Consults: None  Significant Diagnostic Studies: none  Treatments: surgery: 3 level acdf with plating  Discharge Exam: Blood pressure 98/72, pulse 63, temperature 97.5 F (36.4 C), temperature source Oral, resp. rate 18, height 6\' 3"  (1.905 m), weight 89.359 kg (197 lb), SpO2 100.00%. Incision/Wound:clean and dry;no new neuro issues; special dressing in place for raw skin areas  Disposition: 01-Home or Self Care   Future Appointments Provider Department Dept Phone   05/24/2013 9:20 AM [facility] [place] [phone]       Medication List    ASK your doctor about these medications       acetaminophen 500 MG tablet  Commonly known as:  TYLENOL  Take 1,000 mg by mouth every 6 (six) hours as needed for moderate pain.     atorvastatin 80 MG tablet  Commonly known as:  LIPITOR  Take 1 tablet (80 mg total) by mouth at bedtime.     doxycycline 100 MG EC tablet  Commonly known as:  DORYX  Take 100 mg by mouth 2 (two) times daily.     FLUoxetine 20 MG capsule  Commonly known as:  PROZAC  Take 1 capsule (20 mg total) by mouth daily.     furosemide 20 MG tablet  Commonly known as:  LASIX  Take 1-2  tablets (20-40  mg total) by mouth daily.     gabapentin 300 MG capsule  Commonly known as:  NEURONTIN  Take 300 mg by mouth 3 (three) times daily.     l-methylfolate-B6-B12 3-35-2 MG Tabs  Commonly known as:   METANX  Take 1 tablet by mouth daily.     lisinopril 2.5 MG tablet  Commonly known as:  PRINIVIL,ZESTRIL  Take 2.5 mg by mouth daily.     metFORMIN 500 MG tablet  Commonly known as:  GLUCOPHAGE  Take 500 mg by mouth 2 (two) times daily with a meal.     metoprolol succinate 25 MG 24 hr tablet  Commonly known as:  TOPROL-XL  Take 25 mg by mouth 2 (two) times daily.     naphazoline-pheniramine 0.025-0.3 % ophthalmic solution  Commonly known as:  NAPHCON-A  Place 1 drop into both eyes 2 (two) times daily as needed for irritation.     omeprazole 40 MG capsule  Commonly known as:  PRILOSEC  Take 40 mg by mouth 2 (two) times daily.     oxyCODONE-acetaminophen 5-325 MG per tablet  Commonly known as:  ROXICET  Take 1-2 tablets by mouth every 4 (four) hours as needed for severe pain.     silodosin 8 MG Caps capsule  Commonly known as:  RAPAFLO  Take 1 capsule (8 mg total) by mouth daily with breakfast.     triamcinolone cream 0.1 %  Commonly known as:  KENALOG  Apply 1 application topically 2 (two) times daily as needed (dry skin).     warfarin 2.5 MG tablet  Commonly known as:  COUMADIN  Take 1 tablet (2.5 mg total) by mouth  at bedtime.     zolpidem 5 MG tablet  Commonly known as:  AMBIEN  TAKE 1-2 TABLETS BY MOUTH AT BEDTIME AS NEEDED         At home rest most of the time. Get up 9 or 10 times each day and take a 15 or 20 minute walk. No riding in the car and to your first postoperative appointment. If you have neck surgery you may shower from the chest down starting on the third postoperative day. If you had back surgery he may start showering on the third postoperative day with saran wrap wrapped around your incisional area 3 times. After the shower remove the saran wrap. Take pain medicine as needed and other medications as instructed. Call my office for an appointment.  SignedFaythe Ghee, MD 05/11/2013, 5:01 PM

## 2013-05-13 ENCOUNTER — Encounter (HOSPITAL_COMMUNITY): Payer: Self-pay | Admitting: Emergency Medicine

## 2013-05-13 ENCOUNTER — Emergency Department (HOSPITAL_COMMUNITY): Payer: Medicare Other

## 2013-05-13 ENCOUNTER — Inpatient Hospital Stay (HOSPITAL_COMMUNITY)
Admission: EM | Admit: 2013-05-13 | Discharge: 2013-05-17 | DRG: 948 | Disposition: A | Discharge status: Home / Self Care | Payer: Medicare Other | Attending: Neurosurgery | Admitting: Neurosurgery

## 2013-05-13 DIAGNOSIS — G8929 Other chronic pain: Secondary | ICD-10-CM | POA: Diagnosis present

## 2013-05-13 DIAGNOSIS — F3289 Other specified depressive episodes: Secondary | ICD-10-CM | POA: Diagnosis present

## 2013-05-13 DIAGNOSIS — M25519 Pain in unspecified shoulder: Secondary | ICD-10-CM | POA: Insufficient documentation

## 2013-05-13 DIAGNOSIS — K219 Gastro-esophageal reflux disease without esophagitis: Secondary | ICD-10-CM | POA: Diagnosis present

## 2013-05-13 DIAGNOSIS — E669 Obesity, unspecified: Secondary | ICD-10-CM | POA: Diagnosis present

## 2013-05-13 DIAGNOSIS — I739 Peripheral vascular disease, unspecified: Secondary | ICD-10-CM | POA: Diagnosis present

## 2013-05-13 DIAGNOSIS — M129 Arthropathy, unspecified: Secondary | ICD-10-CM | POA: Diagnosis present

## 2013-05-13 DIAGNOSIS — G4700 Insomnia, unspecified: Secondary | ICD-10-CM | POA: Diagnosis present

## 2013-05-13 DIAGNOSIS — F411 Generalized anxiety disorder: Secondary | ICD-10-CM | POA: Diagnosis present

## 2013-05-13 DIAGNOSIS — E119 Type 2 diabetes mellitus without complications: Secondary | ICD-10-CM | POA: Diagnosis present

## 2013-05-13 DIAGNOSIS — Z96659 Presence of unspecified artificial knee joint: Secondary | ICD-10-CM

## 2013-05-13 DIAGNOSIS — F329 Major depressive disorder, single episode, unspecified: Secondary | ICD-10-CM | POA: Diagnosis present

## 2013-05-13 DIAGNOSIS — I10 Essential (primary) hypertension: Secondary | ICD-10-CM | POA: Diagnosis present

## 2013-05-13 DIAGNOSIS — M545 Low back pain, unspecified: Secondary | ICD-10-CM | POA: Diagnosis present

## 2013-05-13 DIAGNOSIS — Z7901 Long term (current) use of anticoagulants: Secondary | ICD-10-CM

## 2013-05-13 DIAGNOSIS — H9190 Unspecified hearing loss, unspecified ear: Secondary | ICD-10-CM | POA: Diagnosis present

## 2013-05-13 DIAGNOSIS — M542 Cervicalgia: Secondary | ICD-10-CM | POA: Diagnosis not present

## 2013-05-13 DIAGNOSIS — Z86711 Personal history of pulmonary embolism: Secondary | ICD-10-CM | POA: Diagnosis not present

## 2013-05-13 DIAGNOSIS — Z96619 Presence of unspecified artificial shoulder joint: Secondary | ICD-10-CM

## 2013-05-13 DIAGNOSIS — G8918 Other acute postprocedural pain: Principal | ICD-10-CM | POA: Diagnosis present

## 2013-05-13 DIAGNOSIS — I4891 Unspecified atrial fibrillation: Secondary | ICD-10-CM | POA: Diagnosis present

## 2013-05-13 DIAGNOSIS — Z981 Arthrodesis status: Secondary | ICD-10-CM

## 2013-05-13 DIAGNOSIS — M5090 Cervical disc disorder, unspecified, unspecified cervical region: Secondary | ICD-10-CM | POA: Diagnosis not present

## 2013-05-13 DIAGNOSIS — R609 Edema, unspecified: Secondary | ICD-10-CM | POA: Diagnosis present

## 2013-05-13 LAB — CBC WITH DIFFERENTIAL/PLATELET
BASOS ABS: 0 10*3/uL (ref 0.0–0.1)
Basophils Relative: 0 % (ref 0–1)
EOS ABS: 0.1 10*3/uL (ref 0.0–0.7)
EOS PCT: 2 % (ref 0–5)
HCT: 38.9 % — ABNORMAL LOW (ref 39.0–52.0)
Hemoglobin: 13.5 g/dL (ref 13.0–17.0)
LYMPHS ABS: 1.4 10*3/uL (ref 0.7–4.0)
LYMPHS PCT: 16 % (ref 12–46)
MCH: 32.2 pg (ref 26.0–34.0)
MCHC: 34.7 g/dL (ref 30.0–36.0)
MCV: 92.8 fL (ref 78.0–100.0)
Monocytes Absolute: 1.3 10*3/uL — ABNORMAL HIGH (ref 0.1–1.0)
Monocytes Relative: 15 % — ABNORMAL HIGH (ref 3–12)
NEUTROS PCT: 67 % (ref 43–77)
Neutro Abs: 5.8 10*3/uL (ref 1.7–7.7)
PLATELETS: 199 10*3/uL (ref 150–400)
RBC: 4.19 MIL/uL — AB (ref 4.22–5.81)
RDW: 13.7 % (ref 11.5–15.5)
WBC: 8.5 10*3/uL (ref 4.0–10.5)

## 2013-05-13 LAB — BASIC METABOLIC PANEL
BUN: 21 mg/dL (ref 6–23)
CALCIUM: 8.9 mg/dL (ref 8.4–10.5)
CO2: 22 meq/L (ref 19–32)
Chloride: 101 mEq/L (ref 96–112)
Creatinine, Ser: 0.92 mg/dL (ref 0.50–1.35)
GFR calc Af Amer: >90 mL/min (ref >90)
GFR, EST NON AFRICAN AMERICAN: 83 mL/min — AB (ref >90)
GLUCOSE: 179 mg/dL — AB (ref 70–99)
POTASSIUM: 3.8 meq/L (ref 3.7–5.3)
Sodium: 139 mEq/L (ref 137–147)

## 2013-05-13 LAB — GLUCOSE, CAPILLARY: GLUCOSE-CAPILLARY: 215 mg/dL — AB (ref 70–99)

## 2013-05-13 MED ORDER — SENNA 8.6 MG PO TABS
1.0000 | ORAL_TABLET | Freq: Two times a day (BID) | ORAL | Status: DC
  Administered 2013-05-14 – 2013-05-16 (×6): 8.6 mg via ORAL
  Filled 2013-05-13 (×9): qty 1

## 2013-05-13 MED ORDER — NAPHAZOLINE-PHENIRAMINE 0.025-0.3 % OP SOLN
1.0000 [drp] | Freq: Two times a day (BID) | OPHTHALMIC | Status: DC | PRN
  Filled 2013-05-13: qty 15

## 2013-05-13 MED ORDER — ALUM & MAG HYDROXIDE-SIMETH 200-200-20 MG/5ML PO SUSP: 30.0000 mL | Freq: Four times a day (QID) | ORAL | Status: DC | PRN

## 2013-05-13 MED ORDER — DEXAMETHASONE SODIUM PHOSPHATE 10 MG/ML IJ SOLN
10.0000 mg | Freq: Once | INTRAMUSCULAR | Status: AC
  Administered 2013-05-13: 10 mg via INTRAVENOUS
  Filled 2013-05-13: qty 1

## 2013-05-13 MED ORDER — FLEET ENEMA 7-19 GM/118ML RE ENEM: 1.0000 | ENEMA | Freq: Once | RECTAL | Status: AC | PRN

## 2013-05-13 MED ORDER — PANTOPRAZOLE SODIUM 40 MG PO TBEC
40.0000 mg | DELAYED_RELEASE_TABLET | Freq: Every day | ORAL | Status: DC
  Administered 2013-05-14 – 2013-05-16 (×3): 40 mg via ORAL
  Filled 2013-05-13 (×3): qty 1

## 2013-05-13 MED ORDER — FLUOXETINE HCL 20 MG PO CAPS
20.0000 mg | ORAL_CAPSULE | Freq: Every day | ORAL | Status: DC
  Administered 2013-05-14 – 2013-05-16 (×3): 20 mg via ORAL
  Filled 2013-05-13 (×4): qty 1

## 2013-05-13 MED ORDER — ONDANSETRON HCL 4 MG/2ML IJ SOLN
4.0000 mg | Freq: Three times a day (TID) | INTRAMUSCULAR | Status: AC | PRN
  Administered 2013-05-13: 4 mg via INTRAVENOUS
  Filled 2013-05-13: qty 2

## 2013-05-13 MED ORDER — ONDANSETRON HCL 4 MG/2ML IJ SOLN
4.0000 mg | Freq: Once | INTRAMUSCULAR | Status: AC
  Administered 2013-05-13: 4 mg via INTRAVENOUS
  Filled 2013-05-13: qty 2

## 2013-05-13 MED ORDER — METFORMIN HCL 500 MG PO TABS
500.0000 mg | ORAL_TABLET | Freq: Two times a day (BID) | ORAL | Status: DC
  Administered 2013-05-14 – 2013-05-17 (×7): 500 mg via ORAL
  Filled 2013-05-13 (×9): qty 1

## 2013-05-13 MED ORDER — DOCUSATE SODIUM 100 MG PO CAPS
100.0000 mg | ORAL_CAPSULE | Freq: Two times a day (BID) | ORAL | Status: DC
  Administered 2013-05-14 – 2013-05-16 (×6): 100 mg via ORAL
  Filled 2013-05-13 (×7): qty 1

## 2013-05-13 MED ORDER — L-METHYLFOLATE-B6-B12 3-35-2 MG PO TABS
1.0000 | ORAL_TABLET | Freq: Every day | ORAL | Status: DC
  Administered 2013-05-14 – 2013-05-16 (×3): 1 via ORAL
  Filled 2013-05-13 (×4): qty 1

## 2013-05-13 MED ORDER — BETAMETHASONE VALERATE 0.12 % EX FOAM: 1.0000 "application " | Freq: Every evening | CUTANEOUS | Status: DC

## 2013-05-13 MED ORDER — LISINOPRIL 2.5 MG PO TABS
2.5000 mg | ORAL_TABLET | Freq: Every day | ORAL | Status: DC
  Administered 2013-05-14 – 2013-05-15 (×2): 2.5 mg via ORAL
  Filled 2013-05-13 (×4): qty 1

## 2013-05-13 MED ORDER — HYDROMORPHONE HCL PF 1 MG/ML IJ SOLN
1.0000 mg | INTRAMUSCULAR | Status: DC | PRN
  Administered 2013-05-13: 1 mg via INTRAVENOUS
  Filled 2013-05-13: qty 1

## 2013-05-13 MED ORDER — IOHEXOL 300 MG/ML  SOLN
75.0000 mL | Freq: Once | INTRAMUSCULAR | Status: AC | PRN
  Administered 2013-05-13: 75 mL via INTRAVENOUS

## 2013-05-13 MED ORDER — ALPRAZOLAM 0.5 MG PO TABS: 0.5000 mg | ORAL_TABLET | Freq: Two times a day (BID) | ORAL | Status: DC | PRN

## 2013-05-13 MED ORDER — DEXAMETHASONE SODIUM PHOSPHATE 10 MG/ML IJ SOLN
10.0000 mg | Freq: Four times a day (QID) | INTRAMUSCULAR | Status: DC
  Administered 2013-05-14 (×2): 10 mg via INTRAVENOUS
  Filled 2013-05-13 (×6): qty 1

## 2013-05-13 MED ORDER — OXYCODONE-ACETAMINOPHEN 5-325 MG PO TABS
1.0000 | ORAL_TABLET | ORAL | Status: DC | PRN
  Administered 2013-05-14 – 2013-05-16 (×12): 2 via ORAL
  Filled 2013-05-13 (×12): qty 2

## 2013-05-13 MED ORDER — GABAPENTIN 300 MG PO CAPS
300.0000 mg | ORAL_CAPSULE | Freq: Every day | ORAL | Status: DC
  Administered 2013-05-14 – 2013-05-16 (×3): 300 mg via ORAL
  Filled 2013-05-13 (×4): qty 1

## 2013-05-13 MED ORDER — HYDROMORPHONE HCL PF 1 MG/ML IJ SOLN
1.0000 mg | Freq: Once | INTRAMUSCULAR | Status: AC
  Administered 2013-05-13: 1 mg via INTRAVENOUS
  Filled 2013-05-13: qty 1

## 2013-05-13 MED ORDER — MOMETASONE FURO-FORMOTEROL FUM 100-5 MCG/ACT IN AERO
2.0000 | INHALATION_SPRAY | Freq: Two times a day (BID) | RESPIRATORY_TRACT | Status: DC
  Administered 2013-05-14 – 2013-05-17 (×7): 2 via RESPIRATORY_TRACT
  Filled 2013-05-13: qty 8.8

## 2013-05-13 MED ORDER — ATORVASTATIN CALCIUM 80 MG PO TABS
80.0000 mg | ORAL_TABLET | Freq: Every day | ORAL | Status: DC
  Administered 2013-05-14 – 2013-05-16 (×3): 80 mg via ORAL
  Filled 2013-05-13 (×4): qty 1

## 2013-05-13 MED ORDER — HYDROMORPHONE HCL PF 1 MG/ML IJ SOLN
1.0000 mg | INTRAMUSCULAR | Status: DC | PRN
Start: 2013-05-13 — End: 2013-05-15
  Administered 2013-05-14 – 2013-05-15 (×10): 1 mg via INTRAVENOUS
  Filled 2013-05-13 (×10): qty 1

## 2013-05-13 MED ORDER — BISACODYL 10 MG RE SUPP
10.0000 mg | Freq: Every day | RECTAL | Status: DC | PRN
  Administered 2013-05-16: 10 mg via RECTAL
  Filled 2013-05-13 (×2): qty 1

## 2013-05-13 MED ORDER — INSULIN ASPART 100 UNIT/ML ~~LOC~~ SOLN
0.0000 [IU] | Freq: Three times a day (TID) | SUBCUTANEOUS | Status: DC
  Administered 2013-05-14 (×2): 7 [IU] via SUBCUTANEOUS
  Administered 2013-05-14: 11 [IU] via SUBCUTANEOUS
  Administered 2013-05-15: 7 [IU] via SUBCUTANEOUS
  Administered 2013-05-15: 4 [IU] via SUBCUTANEOUS
  Administered 2013-05-15: 7 [IU] via SUBCUTANEOUS
  Administered 2013-05-16 (×2): 4 [IU] via SUBCUTANEOUS
  Administered 2013-05-16 – 2013-05-17 (×2): 7 [IU] via SUBCUTANEOUS

## 2013-05-13 MED ORDER — POLYETHYLENE GLYCOL 3350 17 G PO PACK
17.0000 g | PACK | Freq: Every day | ORAL | Status: DC | PRN
  Administered 2013-05-15: 17 g via ORAL
  Filled 2013-05-13 (×2): qty 1

## 2013-05-13 MED ORDER — FUROSEMIDE 20 MG PO TABS
20.0000 mg | ORAL_TABLET | Freq: Every day | ORAL | Status: DC
  Administered 2013-05-14 – 2013-05-16 (×3): 20 mg via ORAL
  Filled 2013-05-13 (×4): qty 1

## 2013-05-13 MED ORDER — TAMSULOSIN HCL 0.4 MG PO CAPS
0.4000 mg | ORAL_CAPSULE | Freq: Every day | ORAL | Status: DC
  Administered 2013-05-14 – 2013-05-16 (×3): 0.4 mg via ORAL
  Filled 2013-05-13 (×4): qty 1

## 2013-05-13 NOTE — ED Provider Notes (Signed)
CSN: [id number]     Arrival date & time 05/13/13  1642 History   First MD Initiated Contact with Patient 05/13/13 1656     Chief Complaint  Patient presents with  . Post-op Problem  . Dysphagia     (Consider location/radiation/quality/duration/timing/severity/associated sxs/prior Treatment) HPI  73 year old male with hx of afib on coumadin, chronic back pain, peripheral vascular disease, who recently had anterior cervical decompression/discectomy and spinal fusion at level C3-6 on 05/09/13 by Dr. Joya Salm is presenting with c/o neck pain and trouble swallowing.  Patient reports he has slipped disc in his neck pain had surgery 4 days ago. His pain hospital 2 days and return home 2 days ago. When he returned home his pain medication was under control for about half a day however it has progressively worse despite taking prescribed pain medication. Pain is primarily to his neck, nonradiating, excruciating, and persistent. He is able to swallow but complaining of worsening pain with swallowing. No complaints of fever, chills, chest pain, shortness of breath or cough, new numbness or weakness. He has spoken with his neurosurgeon, Dr. Ellene Route this morning and was prescribed muscle relaxant which he took 3 separate doses which provide no relief. Dr. Ellene Route instruct patient to come to the ER and to have a cervical x-ray.  Past Medical History  Diagnosis Date  . Diverticulosis of colon (without mention of hemorrhage)   . Other pulmonary embolism and infarction   . Restless legs syndrome (RLS)   . Obesity, unspecified   . Gastroparesis   . Lumbago   . Anxiety state, unspecified   . Pure hypercholesterolemia     takes Atorvastatin daily  . Hemorrhoids   . Back pain, chronic   . Hx of gout     but doesn't take any meds  . Bruises easily     pt is on Coumadin  . Cancer     SKIN CANCER REMOVED  . Esophageal reflux     takes Omeprazole daily  . Insomnia     takes Ambien as nightly as needed  .  Atrial fibrillation     takes Coumadin daily at bedtime  . Unspecified essential hypertension     takes Lisinopril daily  . Depressive disorder, not elsewhere classified     takes Prozac daily  . History of blood clots     behind right knee and then went into left lung 58yrs ago  . Peripheral edema     takes Furosemide daily  . Unspecified asthma(493.90)     as a child  . History of bronchitis     spring of 2014--Advair prn  . Pneumonia     last time about 23yrs ago  . Arthritis   . Joint swelling   . Joint pain   . Chronic back pain   . History of colon polyps   . History of kidney stones   . Hard of hearing     wears hearing aids  . Type II or unspecified type diabetes mellitus without mention of complication, not stated as uncontrolled     not taking any medications at this time  . Dysrhythmia     Atrial Fibrillation  . Peripheral vascular disease    Past Surgical History  Procedure Laterality Date  . Knee surgery Right     x 4  . Artery repair      Left forearm  . Foot surgery      Left foot   . Replacement total knee Left  x 2  . Breath tek h pylori  01/08/2011    Procedure: BREATH TEK H PYLORI;  Surgeon: Pedro Earls, MD;  Location: [facility];  Service: General;  Laterality: N/A;  . Hand surgery      LEFT  . Leg surgery      FOR NECROTIZING FASCITIS L LEG AND GROIN  . 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 Shoulder arthroscopy    . Shoulder surgery Left 2014  . Back surgery      x 3  . Knee surgery Left     arthroscopy  . Eye surgery      cataract bil  . Injections in back      x 18  . Colonoscopy    . Cystoscopy    . Total shoulder arthroplasty Left 01/26/2013    Procedure: TOTAL SHOULDER ARTHROPLASTY;  Surgeon: Nita Sells, MD;  Location: [facility];  Service: Orthopedics;   Laterality: Left;  Left total shoulder arthroplasty  . Anterior cervical decomp/discectomy fusion N/A 05/09/2013    Procedure: ANTERIOR CERVICAL DECOMPRESSION/DISCECTOMY FUSION CERVICAL THREE-FOUR,CERVICAL FOUR-FIVE,CERVICAL FIVE-SIX;  Surgeon: Floyce Stakes, MD;  Location: [facility];  Service: Neurosurgery;  Laterality: N/A;   Family History  Problem Relation Age of Onset  . Uterine cancer Mother     mets  . Breast cancer Mother   . Cancer Mother     cervical  . Emphysema Father   . Alzheimer's disease Sister   . Prostate cancer Brother   . Heart attack Brother   . Colon cancer Brother 40   History  Substance Use Topics  . Smoking status: Never Smoker   . Smokeless tobacco: Never Used  . Alcohol Use: No    Review of Systems  Constitutional: Negative for fever.  Musculoskeletal: Positive for neck pain.  Skin: Positive for wound.  Neurological: Negative for numbness.  All other systems reviewed and are negative.      Allergies  Sulfacetamide sodium and Adhesive  Home Medications   Current Outpatient Rx  Name  Route  Sig  Dispense  Refill  . acetaminophen (TYLENOL) 500 MG tablet   Oral   Take 1,000 mg by mouth every 6 (six) hours as needed for moderate pain.         [name] ALPRAZolam (XANAX) 0.5 MG tablet   Oral   Take 1 tablet (0.5 mg total) by mouth 2 (two) times daily as needed for anxiety.   50 tablet   0   . atorvastatin (LIPITOR) 80 MG tablet   Oral   Take 1 tablet (80 mg total) by mouth at bedtime.   90 tablet   3   . FLUoxetine (PROZAC) 20 MG capsule   Oral   Take 1 capsule (20 mg total) by mouth daily.   90 capsule   3   . furosemide (LASIX) 20 MG tablet      Take 1-2  tablets (20-40  mg total) by mouth daily.   135 tablet   3   . gabapentin (NEURONTIN) 300 MG capsule   Oral   Take 300 mg by mouth 3 (three) times daily.         [name] l-methylfolate-B6-B12 (METANX) 3-35-2 MG TABS   Oral   Take 1 tablet by mouth daily.         [name]  lisinopril (PRINIVIL,ZESTRIL) 2.5 MG tablet   Oral   Take 2.5 mg by mouth daily.         . metFORMIN (GLUCOPHAGE) 500 MG tablet   Oral   Take 500 mg by mouth 2 (two) times daily with a meal.         . metoprolol succinate (TOPROL-XL) 25 MG 24 hr tablet   Oral   Take 25 mg by mouth 2 (two) times daily.         . naphazoline-pheniramine (NAPHCON-A) 0.025-0.3 % ophthalmic solution   Both Eyes   Place 1 drop into both eyes 2 (two) times daily as needed for irritation.         [name] omeprazole (PRILOSEC) 40 MG capsule   Oral   Take 40 mg by mouth 2 (two) times daily.         [name] oxyCODONE-acetaminophen (PERCOCET/ROXICET) 5-325 MG per tablet   Oral   Take 1-2 tablets by mouth every 4 (four) hours as needed for moderate pain.   50 tablet   0   . oxyCODONE-acetaminophen (ROXICET) 5-325 MG per tablet   Oral   Take 1-2 tablets by mouth every 4 (four) hours as needed for severe pain.   60 tablet   0   . silodosin (RAPAFLO) 8 MG CAPS capsule   Oral   Take 1 capsule (8 mg total) by mouth daily with breakfast.   90 capsule   3   . triamcinolone cream (KENALOG) 0.1 %   Topical   Apply 1 application topically 2 (two) times daily as needed (dry skin).          [name] warfarin (COUMADIN) 2.5 MG tablet   Oral   Take 1 tablet (2.5 mg total) by mouth at bedtime.   30 tablet   0   . zolpidem (AMBIEN) 5 MG tablet      TAKE 1-2 TABLETS BY MOUTH AT BEDTIME AS NEEDED   30 tablet   0    BP 128/76  Pulse 78  Temp(Src) 97.7 F (36.5 C) (Oral)  Resp 16  Ht 6\' 3"  (1.905 m)  Wt 197 lb (89.359 kg)  BMI 24.62 kg/m2  SpO2 97% Physical Exam  Constitutional: He appears well-developed and well-nourished. No distress.  HENT:  Head: Atraumatic.  Mouth/Throat: Oropharynx is clear and moist.  Eyes: Conjunctivae are normal.  Neck: Neck supple.  Sterile strip throughout anterior neck.  Surgical dressing to upper chest.  Diffused tenderness throughout on palpation without obvious sign of  infection.    Cardiovascular: Exam reveals no gallop and no friction rub.   No murmur heard. Irregularly irregular  Pulmonary/Chest: Effort normal. No respiratory distress. He has no wheezes. He has no rales.  Abdominal: Soft. There is no tenderness.  Musculoskeletal: He exhibits tenderness (tenderness throughout cervical spine and posterior neck without overlying skin changes.  ).  Neurological: He is alert.  Able to ambulate  Skin: No rash noted.  Psychiatric: He has a normal mood and affect.    ED Course  Procedures (including critical care time)  5:23 PM Neck pain status post anterior cervical discectomy/cervical fusion.  Pt able to swallow, but having intense pain.  Will obtain Cspine xray and will consult Dr. Ellene Route.  Pain medication given.    6:50 PM I have consulted Dr. Ellene Route, who will see pt in ER for further care.  CT scan of neck ordered.   8:16 PM Dr. Ellene Route has seen and evaluate pt.  He request for  10mg  of decadron and he will admit pt for further management.     9:58 PM Dr. Ellene Route has not place admitting order for this patient. Will place temporary order per nursing request.    Labs Review Labs Reviewed  CBC WITH DIFFERENTIAL - Abnormal; Notable for the following:    RBC 4.19 (*)    HCT 38.9 (*)    Monocytes Relative 15 (*)    Monocytes Absolute 1.3 (*)    All other components within normal limits  BASIC METABOLIC PANEL - Abnormal; Notable for the following:    Glucose, Bld 179 (*)    GFR calc non Af Amer 83 (*)    All other components within normal limits   Imaging Review Dg Cervical Spine 2-3 Views  05/13/2013   CLINICAL DATA:  post op pain and trouble swallowing  EXAM: CERVICAL SPINE - 2-3 VIEW  COMPARISON:  DG CERVICAL SPINE COMPLETE dated 05/09/2013  FINDINGS: Patient is status post anterior fusion C3 through C6. Hardware appears intact without loosening or failure. No acute osseous abnormalities. Prevertebral soft tissue swelling is appreciated likely  postsurgical.  IMPRESSION: Patient is status post ACDF C3 through C6. Prevertebral soft tissue swelling is appreciated likely postsurgical. There is persistent clinical concern further evaluation with contrasted neck CT recommended.   Electronically Signed   By: Margaree Mackintosh M.D.   On: 05/13/2013 17:52     EKG Interpretation None      MDM   Final diagnoses:  Post-operative pain    BP 124/75  Pulse 91  Temp(Src) 97.7 F (36.5 C) (Oral)  Resp 16  Ht 6\' 3"  (1.905 m)  Wt 197 lb (89.359 kg)  BMI 24.62 kg/m2  SpO2 100%  I have reviewed nursing notes and vital signs. I personally reviewed the imaging tests through PACS system  I reviewed available ER/hospitalization records thought the EMR     Domenic Moras, PA-C 05/13/13 2018  Domenic Moras, PA-C 05/13/13 2159

## 2013-05-13 NOTE — ED Notes (Signed)
PT had surgery on 3/31 to remove to discs. Complains of terrible sore throat and post surgical pain that was controlled by medication until yesterday. MD called in an additional script, took those today with no relief and called MD who instructed them to go to ED.

## 2013-05-13 NOTE — ED Notes (Signed)
Pt reports he is not "having trouble" swallowing but "it just hurts" when he swallows. Pt requesting something to drink but informed patient and family that the patient can not drink or eat until labs result and testings are completed.

## 2013-05-13 NOTE — ED Notes (Signed)
Dr Ellene Route called and stated to order cervical xray and then call him at [phone]

## 2013-05-13 NOTE — ED Notes (Signed)
Visitor reports that pt had disc surgery on 05/09/2013, pt c/o severe pain to neck and shoulder area, pt also c/o difficulty swallowing. Pt denies shortness of breath. Pt was unable to sleep last night due to pain. Dr called in muscle relaxer for pt but still no relief after 3 doses today.

## 2013-05-13 NOTE — H&P (Signed)
[name] is an 73 y.o. male.   Chief Complaint: Severe neck and shoulder pain since surgery for anterior decompression of the neck. HPI: Patient is a 73 year old individual who's had 3 level anterior cervical decompression and arthrodesis at C3-4 C4-5 and C5-C6. Surgery was performed on 05/09/2013. He was sent home the following hospital day however he developed significant pain in the shoulder regions and in the base of the neck. He's had some difficulty swallowing. He is taken although prescribed medication, his wife called this morning indicating that the patient was having severe discomfort I suggested the addition of a muscle relaxer in the form of Flexeril. This did not seem to help the pain much and the patient called back and I suggested evaluation in the emergency department. X-rays performed in the emergency department demonstrate that the patient has intact fixation but there is a moderate amount of prevertebral swelling. On evaluation he is truly uncomfortable to any palpation in the anterior region of the neck and the base of the shoulders supraclavicular fossa are both tender to palpation. He is being brought into the hospital now for further evaluation with a CT scan, he'll be started on high-dose steroids, despite being diabetic. Will cover his increase glucose with insulin.  Past Medical History  Diagnosis Date  . Diverticulosis of colon (without mention of hemorrhage)   . Other pulmonary embolism and infarction   . Restless legs syndrome (RLS)   . Obesity, unspecified   . Gastroparesis   . Lumbago   . Anxiety state, unspecified   . Pure hypercholesterolemia     takes Atorvastatin daily  . Hemorrhoids   . Back pain, chronic   . Hx of gout     but doesn't take any meds  . Bruises easily     pt is on Coumadin  . Cancer     SKIN CANCER REMOVED  . Esophageal reflux     takes Omeprazole daily  . Insomnia     takes Ambien as nightly as needed  . Atrial fibrillation    takes Coumadin daily at bedtime  . Unspecified essential hypertension     takes Lisinopril daily  . Depressive disorder, not elsewhere classified     takes Prozac daily  . History of blood clots     behind right knee and then went into left lung 48yr ago  . Peripheral edema     takes Furosemide daily  . Unspecified asthma(493.90)     as a child  . History of bronchitis     spring of 2014--Advair prn  . Pneumonia     last time about 229yrago  . Arthritis   . Joint swelling   . Joint pain   . Chronic back pain   . History of colon polyps   . History of kidney stones   . Hard of hearing     wears hearing aids  . Type II or unspecified type diabetes mellitus without mention of complication, not stated as uncontrolled     not taking any medications at this time  . Dysrhythmia     Atrial Fibrillation  . Peripheral vascular disease     Past Surgical History  Procedure Laterality Date  . Knee surgery Right     x 4  . Artery repair      Left forearm  . Foot surgery      Left foot   . Replacement total knee Left     x 2  . Breath  tek h pylori  01/08/2011    Procedure: BREATH TEK H PYLORI;  Surgeon: Pedro Earls, MD;  Location: [facility];  Service: General;  Laterality: N/A;  . Hand surgery      LEFT  . Leg surgery      FOR NECROTIZING FASCITIS L LEG AND GROIN  . 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 Shoulder arthroscopy    . Shoulder surgery Left 2014  . Back surgery      x 3  . Knee surgery Left     arthroscopy  . Eye surgery      cataract bil  . Injections in back      x 18  . Colonoscopy    . Cystoscopy    . Total shoulder arthroplasty Left 01/26/2013    Procedure: TOTAL SHOULDER ARTHROPLASTY;  Surgeon: Nita Sells, MD;  Location: [facility];  Service: Orthopedics;  Laterality: Left;  Left total  shoulder arthroplasty  . Anterior cervical decomp/discectomy fusion N/A 05/09/2013    Procedure: ANTERIOR CERVICAL DECOMPRESSION/DISCECTOMY FUSION CERVICAL THREE-FOUR,CERVICAL FOUR-FIVE,CERVICAL FIVE-SIX;  Surgeon: Floyce Stakes, MD;  Location: [facility];  Service: Neurosurgery;  Laterality: N/A;    Family History  Problem Relation Age of Onset  . Uterine cancer Mother     mets  . Breast cancer Mother   . Cancer Mother     cervical  . Emphysema Father   . Alzheimer's disease Sister   . Prostate cancer Brother   . Heart attack Brother   . Colon cancer Brother 41   Social History:  reports that he has never smoked. He has never used smokeless tobacco. He reports that he does not drink alcohol or use illicit drugs.  Allergies:  Allergies  Allergen Reactions  . Sulfacetamide Sodium     REACTION: Throat swelling  . Adhesive [Tape]     Tears skin     (Not in a hospital admission)  Results for orders placed during the hospital encounter of 05/13/13 (from the past 48 hour(s))  CBC WITH DIFFERENTIAL     Status: Abnormal   Collection Time    05/13/13  5:18 PM      Result Value Ref Range   WBC 8.5  4.0 - 10.5 K/uL   RBC 4.19 (*) 4.22 - 5.81 MIL/uL   Hemoglobin 13.5  13.0 - 17.0 g/dL   HCT 38.9 (*) 39.0 - 52.0 %   MCV 92.8  78.0 - 100.0 fL   MCH 32.2  26.0 - 34.0 pg   MCHC 34.7  30.0 - 36.0 g/dL   RDW 13.7  11.5 - 15.5 %   Platelets 199  150 - 400 K/uL   Neutrophils Relative % 67  43 - 77 %   Neutro Abs 5.8  1.7 - 7.7 K/uL   Lymphocytes Relative 16  12 - 46 %   Lymphs Abs 1.4  0.7 - 4.0 K/uL   Monocytes Relative 15 (*) 3 - 12 %   Monocytes Absolute 1.3 (*) 0.1 - 1.0 K/uL   Eosinophils Relative 2  0 - 5 %   Eosinophils Absolute 0.1  0.0 - 0.7 K/uL   Basophils Relative 0  0 - 1 %   Basophils Absolute 0.0  0.0 - 0.1 K/uL  BASIC METABOLIC PANEL     Status: Abnormal   Collection Time  05/13/13  5:18 PM      Result Value Ref Range   Sodium 139  137 - 147 mEq/L    Potassium 3.8  3.7 - 5.3 mEq/L   Chloride 101  96 - 112 mEq/L   CO2 22  19 - 32 mEq/L   Glucose, Bld 179 (*) 70 - 99 mg/dL   BUN 21  6 - 23 mg/dL   Creatinine, Ser 0.92  0.50 - 1.35 mg/dL   Calcium 8.9  8.4 - 10.5 mg/dL   GFR calc non Af Amer 83 (*) >90 mL/min   GFR calc Af Amer >90  >90 mL/min   Comment: (NOTE)     The eGFR has been calculated using the CKD EPI equation.     This calculation has not been validated in all clinical situations.     eGFR's persistently <90 mL/min signify possible Chronic Kidney     Disease.   Dg Cervical Spine 2-3 Views  05/13/2013   CLINICAL DATA:  post op pain and trouble swallowing  EXAM: CERVICAL SPINE - 2-3 VIEW  COMPARISON:  DG CERVICAL SPINE COMPLETE dated 05/09/2013  FINDINGS: Patient is status post anterior fusion C3 through C6. Hardware appears intact without loosening or failure. No acute osseous abnormalities. Prevertebral soft tissue swelling is appreciated likely postsurgical.  IMPRESSION: Patient is status post ACDF C3 through C6. Prevertebral soft tissue swelling is appreciated likely postsurgical. There is persistent clinical concern further evaluation with contrasted neck CT recommended.   Electronically Signed   By: Margaree Mackintosh M.D.   On: 05/13/2013 17:52   Ct Soft Tissue Neck W Contrast  05/13/2013   CLINICAL DATA:  Postop anterior cervical spine fusion. Difficulty swallowing and neck pain  EXAM: CT NECK WITH CONTRAST  TECHNIQUE: Multidetector CT imaging of the neck was performed using the standard protocol following the bolus administration of intravenous contrast.  CONTRAST:  24m OMNIPAQUE IOHEXOL 300 MG/ML  SOLN  COMPARISON:  Cervical spine radiographs 05/13/2013.  FINDINGS: The anterior plate and screws appear well position without complicating features. Anterior and interbody fusion changes at C3-4, C4-5 and C5-6. There is fairly marked prevertebral soft tissue swelling/ edema and fluid. I do not see a discrete abscess or hematoma. This  could be liquefying hematoma but CSF leak or esophageal injury are possible. There is also fluid endplate lumen extending around left side of the neck through the operative site with some residual air.  The left internal jugular vein is not well visualized. May be compressed by the fluid collection. I do not think it is obviously thrombosed.  The lung apices are clear. There is mild airway impingement above the level of the true cords.  IMPRESSION: Extensive prevertebral soft tissue swelling/ edema and fluid. This could be liquefying hematoma related to the recent surgery but a CSF leak and esophageal injury are also possibilities.  The hardware is intact without complicating features and no spinal canal compromise is evident.  The left internal jugular vein is not well visualized. It could be severely compressed.   Electronically Signed   By: MKalman JewelsM.D.   On: 05/13/2013 21:16    Review of Systems  Constitutional: Positive for malaise/fatigue.       Bilateral shoulder pain  Eyes: Negative.   Respiratory: Negative.   Cardiovascular: Negative.        Chronic atrial fibrillation  Gastrointestinal: Negative.   Genitourinary:       Urinary retention  Musculoskeletal: Positive for neck pain.  Skin: Negative.  Neurological: Positive for focal weakness.  Endo/Heme/Allergies: Negative.        Coumadin anticoagulation chronically  Psychiatric/Behavioral: Negative.     Blood pressure 116/77, pulse 105, temperature 97.7 F (36.5 C), temperature source Oral, resp. rate 15, height 6' 3"  (1.905 m), weight 89.359 kg (197 lb), SpO2 97.00%. Physical Exam  Constitutional: He appears well-developed and well-nourished.  HENT:  Head: Normocephalic and atraumatic.  Neck:  Moderate swelling and left side of neck underlying incision. Steri-Strips in place. Patient has dressing over anterior chest wall were previous removal of surgical dressing yielded large removal of skin causing a large open sore in  this region  Cardiovascular:  Irregularly irregular heart rate in the 80s  Respiratory: Effort normal and breath sounds normal.  GI: Soft.  Neurological:  Strength in the biceps and triceps are 4/5 left shoulder is weak secondary to recent surgery there. Grip strength is 4/5 intrinsic strength is 4/5 bilaterally  Skin: Skin is warm and dry.  Psychiatric: He has a normal mood and affect. His behavior is normal. Judgment and thought content normal.     Assessment/Plan Postoperative edema the surgical bed status post fusion C3-4 C4-5 and C5-6. Admit to hospital for high-dose steroid treatment.  Roshaunda Starkey J 05/13/2013, 10:37 PM

## 2013-05-14 LAB — CBC
HCT: 39.9 % (ref 39.0–52.0)
HEMOGLOBIN: 13.6 g/dL (ref 13.0–17.0)
MCH: 31.9 pg (ref 26.0–34.0)
MCHC: 34.1 g/dL (ref 30.0–36.0)
MCV: 93.7 fL (ref 78.0–100.0)
PLATELETS: 197 10*3/uL (ref 150–400)
RBC: 4.26 MIL/uL (ref 4.22–5.81)
RDW: 13.7 % (ref 11.5–15.5)
WBC: 7.7 10*3/uL (ref 4.0–10.5)

## 2013-05-14 LAB — GLUCOSE, CAPILLARY
GLUCOSE-CAPILLARY: 290 mg/dL — AB (ref 70–99)
GLUCOSE-CAPILLARY: 279 mg/dL — AB (ref 70–99)
Glucose-Capillary: 237 mg/dL — ABNORMAL HIGH (ref 70–99)
Glucose-Capillary: 245 mg/dL — ABNORMAL HIGH (ref 70–99)

## 2013-05-14 LAB — BASIC METABOLIC PANEL
BUN: 18 mg/dL (ref 6–23)
CO2: 23 mEq/L (ref 19–32)
CREATININE: 0.9 mg/dL (ref 0.50–1.35)
Calcium: 8.9 mg/dL (ref 8.4–10.5)
Chloride: 100 mEq/L (ref 96–112)
GFR calc non Af Amer: 84 mL/min — ABNORMAL LOW (ref >90)
Glucose, Bld: 253 mg/dL — ABNORMAL HIGH (ref 70–99)
POTASSIUM: 5.2 meq/L (ref 3.7–5.3)
Sodium: 137 mEq/L (ref 137–147)

## 2013-05-14 MED ORDER — ZOLPIDEM TARTRATE 5 MG PO TABS
5.0000 mg | ORAL_TABLET | Freq: Every evening | ORAL | Status: DC | PRN
  Administered 2013-05-14 – 2013-05-16 (×3): 5 mg via ORAL
  Filled 2013-05-14 (×3): qty 1

## 2013-05-14 MED ORDER — DEXAMETHASONE SODIUM PHOSPHATE 4 MG/ML IJ SOLN
4.0000 mg | Freq: Four times a day (QID) | INTRAMUSCULAR | Status: DC
  Administered 2013-05-14 – 2013-05-17 (×12): 4 mg via INTRAVENOUS
  Filled 2013-05-14 (×19): qty 1

## 2013-05-14 NOTE — Progress Notes (Signed)
Subjective: Patient reports Patient feels remarkably better over the past 12 hours  Objective: Vital signs in last 24 hours: Temp:  [97.1 F (36.2 C)-97.9 F (36.6 C)] 97.1 F (36.2 C) (04/05 1015) Pulse Rate:  [78-105] 87 (04/05 1015) Resp:  [14-23] 16 (04/05 1015) BP: (111-142)/(68-93) 118/68 mmHg (04/05 1015) SpO2:  [95 %-100 %] 99 % (04/05 1015) Weight:  [89.359 kg (197 lb)-90.538 kg (199 lb 9.6 oz)] 90.538 kg (199 lb 9.6 oz) (04/04 2345)  Intake/Output from previous day:   Intake/Output this shift:    motor function reveals good strength in deltoids biceps triceps left shoulder limited range of motion still noted  Lab Results:  Recent Labs  05/13/13 1718 05/14/13 0400  WBC 8.5 7.7  HGB 13.5 13.6  HCT 38.9* 39.9  PLT 199 197   BMET  Recent Labs  05/13/13 1718 05/14/13 0400  NA 139 137  K 3.8 5.2  CL 101 100  CO2 22 23  GLUCOSE 179* 253*  BUN 21 18  CREATININE 0.92 0.90  CALCIUM 8.9 8.9    Studies/Results: Dg Cervical Spine 2-3 Views  05/13/2013   CLINICAL DATA:  post op pain and trouble swallowing  EXAM: CERVICAL SPINE - 2-3 VIEW  COMPARISON:  DG CERVICAL SPINE COMPLETE dated 05/09/2013  FINDINGS: Patient is status post anterior fusion C3 through C6. Hardware appears intact without loosening or failure. No acute osseous abnormalities. Prevertebral soft tissue swelling is appreciated likely postsurgical.  IMPRESSION: Patient is status post ACDF C3 through C6. Prevertebral soft tissue swelling is appreciated likely postsurgical. There is persistent clinical concern further evaluation with contrasted neck CT recommended.   Electronically Signed   By: Margaree Mackintosh M.D.   On: 05/13/2013 17:52   Ct Soft Tissue Neck W Contrast  05/13/2013   CLINICAL DATA:  Postop anterior cervical spine fusion. Difficulty swallowing and neck pain  EXAM: CT NECK WITH CONTRAST  TECHNIQUE: Multidetector CT imaging of the neck was performed using the standard protocol following the bolus  administration of intravenous contrast.  CONTRAST:  64mL OMNIPAQUE IOHEXOL 300 MG/ML  SOLN  COMPARISON:  Cervical spine radiographs 05/13/2013.  FINDINGS: The anterior plate and screws appear well position without complicating features. Anterior and interbody fusion changes at C3-4, C4-5 and C5-6. There is fairly marked prevertebral soft tissue swelling/ edema and fluid. I do not see a discrete abscess or hematoma. This could be liquefying hematoma but CSF leak or esophageal injury are possible. There is also fluid endplate lumen extending around left side of the neck through the operative site with some residual air.  The left internal jugular vein is not well visualized. May be compressed by the fluid collection. I do not think it is obviously thrombosed.  The lung apices are clear. There is mild airway impingement above the level of the true cords.  IMPRESSION: Extensive prevertebral soft tissue swelling/ edema and fluid. This could be liquefying hematoma related to the recent surgery but a CSF leak and esophageal injury are also possibilities.  The hardware is intact without complicating features and no spinal canal compromise is evident.  The left internal jugular vein is not well visualized. It could be severely compressed.   Electronically Signed   By: Kalman Jewels M.D.   On: 05/13/2013 21:16    Assessment/Plan: Improved on steroid medication over past 12 hours. Review of CT scan demonstrates that there is postoperative edema possible small fluid collection that no surgical lesion that needs to be addressed.  LOS: 1  day  Continue IV steroids at slightly lower dose and continue observation of patient.   Ronae Noell J 05/14/2013, 11:19 AM

## 2013-05-14 NOTE — ED Provider Notes (Signed)
Medical screening examination/treatment/procedure(s) were performed by non-physician practitioner and as supervising physician I was immediately available for consultation/collaboration.   Megan E Docherty, MD 05/14/13 1046 

## 2013-05-14 NOTE — Progress Notes (Signed)
Calling report from [place], [place]

## 2013-05-14 NOTE — Progress Notes (Signed)
Received pt from ED via stretcher. Pt ambulated to bed with assistance. Pt alert, oriented and not in acute distress. Oriented pt to the room. Vital signs checked and recorded. Will continue to monitor.

## 2013-05-15 LAB — GLUCOSE, CAPILLARY
GLUCOSE-CAPILLARY: 196 mg/dL — AB (ref 70–99)
GLUCOSE-CAPILLARY: 218 mg/dL — AB (ref 70–99)
GLUCOSE-CAPILLARY: 164 mg/dL — AB (ref 70–99)
Glucose-Capillary: 224 mg/dL — ABNORMAL HIGH (ref 70–99)

## 2013-05-15 LAB — PROTIME-INR
INR: 1.09 (ref 0.00–1.49)
PROTHROMBIN TIME: 13.9 s (ref 11.6–15.2)

## 2013-05-15 MED ORDER — ONDANSETRON HCL 4 MG/2ML IJ SOLN: 4.0000 mg | Freq: Four times a day (QID) | INTRAMUSCULAR | Status: DC | PRN

## 2013-05-15 MED ORDER — DIPHENHYDRAMINE HCL 12.5 MG/5ML PO ELIX: 12.5000 mg | ORAL_SOLUTION | Freq: Four times a day (QID) | ORAL | Status: DC | PRN

## 2013-05-15 MED ORDER — HYDROMORPHONE 0.3 MG/ML IV SOLN: INTRAVENOUS | Status: DC

## 2013-05-15 MED ORDER — DIPHENHYDRAMINE HCL 50 MG/ML IJ SOLN: 12.5000 mg | Freq: Four times a day (QID) | INTRAMUSCULAR | Status: DC | PRN

## 2013-05-15 MED ORDER — NALOXONE HCL 0.4 MG/ML IJ SOLN: 0.4000 mg | INTRAMUSCULAR | Status: DC | PRN

## 2013-05-15 MED ORDER — SODIUM CHLORIDE 0.9 % IJ SOLN: 9.0000 mL | INTRAMUSCULAR | Status: DC | PRN

## 2013-05-15 MED ORDER — ENOXAPARIN SODIUM 100 MG/ML ~~LOC~~ SOLN
90.0000 mg | Freq: Two times a day (BID) | SUBCUTANEOUS | Status: DC
  Administered 2013-05-15 – 2013-05-17 (×4): 90 mg via SUBCUTANEOUS
  Filled 2013-05-15 (×7): qty 1

## 2013-05-15 MED ORDER — DIPHENHYDRAMINE HCL 50 MG/ML IJ SOLN
12.5000 mg | Freq: Four times a day (QID) | INTRAMUSCULAR | Status: DC | PRN
Start: 2013-05-15 — End: 2013-05-15

## 2013-05-15 MED ORDER — NALOXONE HCL 0.4 MG/ML IJ SOLN
0.4000 mg | INTRAMUSCULAR | Status: DC | PRN
Start: 2013-05-15 — End: 2013-05-15

## 2013-05-15 MED ORDER — HYDROMORPHONE 0.3 MG/ML IV SOLN
INTRAVENOUS | Status: DC
  Administered 2013-05-15 (×2): 0.6 mg via INTRAVENOUS
  Administered 2013-05-15: 12:00:00 via INTRAVENOUS
  Administered 2013-05-16: 0.06 mg via INTRAVENOUS
  Administered 2013-05-16: 0.6 mg via INTRAVENOUS
  Filled 2013-05-15: qty 25

## 2013-05-15 NOTE — Consult Note (Signed)
PHARMACY CONSULT NOTE  Pharmacy Consult :  Lovenox Indication : History of atrial fibrillation on chronic Coumadin therapy  Allergies: Allergies  Allergen Reactions  . Sulfacetamide Sodium     REACTION: Throat swelling  . Adhesive [Tape]     Tears skin   Dosing weight : 90.5 kg  Vital Signs: BP 104/58  Pulse 82  Temp(Src) 97.4 F (36.3 C) (Oral)  Resp 16  Ht 6\' 3"  (1.905 m)  Wt 199 lb 9.6 oz (90.538 kg)  BMI 24.95 kg/m2  SpO2 98%  Active Problems: Active Problems:   Post-op pain   Postoperative pain, acute, shoulder   Labs:  Recent Labs  05/13/13 1718 05/14/13 0400 05/15/13 0930  HGB 13.5 13.6  --   HCT 38.9* 39.9  --   PLT 199 197  --   LABPROT  --   --  13.9  INR  --   --  1.09  CREATININE 0.92 0.90  --    Lab Results  Component Value Date   INR 1.09 05/15/2013   INR 1.19 05/02/2013   INR 2.8 04/26/2013   Estimated Creatinine Clearance: 90 ml/min (by C-G formula based on Cr of 0.9).  Medical / Surgical History: Diagnosis  . Diverticulosis of colon (without mention of hemorrhage)  . Other pulmonary embolism and infarction  . Restless legs syndrome (RLS)  . Obesity, unspecified  . Gastroparesis  . Lumbago  . Anxiety state, unspecified  . Pure hypercholesterolemia     . Hemorrhoids  . Back pain, chronic  . Hx of gout     . Bruises easily     . Cancer     . Esophageal reflux     . Insomnia     . Atrial fibrillation     . Unspecified essential hypertension     . Depressive disorder, not elsewhere classified     . History of blood clots     . Peripheral edema     . Unspecified asthma(493.90)     . History of bronchitis     . Pneumonia     . Arthritis  . Joint swelling  . Joint pain  . Chronic back pain  . History of colon polyps  . History of kidney stones  . Hard of hearing     . Type II or unspecified type diabetes mellitus without mention of complication, not stated as uncontrolled     . Dysrhythmia     . Peripheral  vascular disease   Past Surgical History  Procedure Laterality Date  . Knee surgery Right     x 4  . Artery repair      Left forearm  . Foot surgery      Left foot   . Replacement total knee Left     x 2  . Breath tek h pylori  01/08/2011    Procedure: BREATH TEK H PYLORI;  Surgeon: Pedro Earls, MD;  Location: [facility];  Service: General;  Laterality: N/A;  . Hand surgery      LEFT  . Leg surgery      FOR NECROTIZING FASCITIS L LEG AND GROIN  . 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 Shoulder arthroscopy    . Shoulder surgery Left 2014  . Back surgery      x 3  . Knee surgery Left  arthroscopy  . Eye surgery      cataract bil  . Injections in back      x 18  . Colonoscopy    . Cystoscopy    . Total shoulder arthroplasty Left 01/26/2013    Procedure: TOTAL SHOULDER ARTHROPLASTY;  Surgeon: Nita Sells, MD;  Location: [facility];  Service: Orthopedics;  Laterality: Left;  Left total shoulder arthroplasty  . Anterior cervical decomp/discectomy fusion N/A 05/09/2013    Procedure: ANTERIOR CERVICAL DECOMPRESSION/DISCECTOMY FUSION CERVICAL THREE-FOUR,CERVICAL FOUR-FIVE,CERVICAL FIVE-SIX;  Surgeon: Floyce Stakes, MD;  Location: [facility];  Service: Neurosurgery;  Laterality: N/A;    Current Medication[s] Include: Medication PTA: Prescriptions prior to admission  Medication Sig Dispense Refill  . acetaminophen (TYLENOL) 500 MG tablet Take 1,000 mg by mouth every 6 (six) hours as needed for moderate pain.      [name] ADVAIR DISKUS 250-50 MCG/DOSE AEPB Inhale 1 puff into the lungs 2 (two) times daily as needed (wheezing/sob).       . ALPRAZolam (XANAX) 0.5 MG tablet Take 1 tablet (0.5 mg total) by mouth 2 (two) times daily as needed for anxiety.  50 tablet  0  . atorvastatin (LIPITOR) 80 MG tablet Take 1 tablet  (80 mg total) by mouth at bedtime.  90 tablet  3  . Betamethasone Valerate 0.12 % foam Apply 1 application topically every evening.       . furosemide (LASIX) 20 MG tablet Take 1-2  tablets (20-40  mg total) by mouth daily.  135 tablet  3  . gabapentin (NEURONTIN) 300 MG capsule Take 300 mg by mouth daily.       [name] l-methylfolate-B6-B12 (METANX) 3-35-2 MG TABS Take 1 tablet by mouth daily.      [name] lisinopril (PRINIVIL,ZESTRIL) 2.5 MG tablet Take 2.5 mg by mouth daily.      . metFORMIN (GLUCOPHAGE) 500 MG tablet Take 500 mg by mouth 2 (two) times daily with a meal.      . naphazoline-pheniramine (NAPHCON-A) 0.025-0.3 % ophthalmic solution Place 1 drop into both eyes 2 (two) times daily as needed for irritation.      [name] omeprazole (PRILOSEC) 40 MG capsule Take 40 mg by mouth 2 (two) times daily.      [name] oxyCODONE-acetaminophen (PERCOCET/ROXICET) 5-325 MG per tablet Take 1-2 tablets by mouth every 4 (four) hours as needed for moderate pain.  50 tablet  0  . silodosin (RAPAFLO) 8 MG CAPS capsule Take 1 capsule (8 mg total) by mouth daily with breakfast.  90 capsule  3  . triamcinolone cream (KENALOG) 0.1 % Apply 1 application topically 2 (two) times daily as needed (dry skin).       [name] warfarin (COUMADIN) 2.5 MG tablet Take 2.5 mg by mouth daily.      [name] zolpidem (AMBIEN) 5 MG tablet Take 5-10 mg by mouth at bedtime as needed for sleep.      [name] FLUoxetine (PROZAC) 20 MG capsule Take 1 capsule (20 mg total) by mouth daily.  90 capsule  3   Scheduled:  Scheduled:  . atorvastatin  80 mg Oral QHS  . dexamethasone  4 mg Intravenous 4 times per day  . docusate sodium  100 mg Oral BID  . FLUoxetine  20 mg Oral Daily  . furosemide  20 mg Oral Daily  . gabapentin  300 mg Oral Daily  . HYDROmorphone PCA 0.3 mg/mL   Intravenous 6 times per day  . insulin aspart  0-20 Units Subcutaneous TID  WC  . l-methylfolate-B6-B12  1 tablet Oral Daily  . lisinopril  2.5 mg Oral Daily  . metFORMIN  500 mg Oral BID WC  .  mometasone-formoterol  2 puff Inhalation BID  . pantoprazole  40 mg Oral Daily  . senna  1 tablet Oral BID  . tamsulosin  0.4 mg Oral Daily    Assessment:  73 y.o.male asmitted with severe neck pain following decompression surgery of the neck.  He is on chronic Coumadin at home for atrial fibrillation and history of PE/infarction.  Coumadin has been on hold since admission.    Lovenox to be started to cover for atrial fibrillation per pharmacy consult.  Patient weight 90.5 kg, CrCl > 90 ml/min.  Goal of Therapy:  Lovenox Anti-Xa level 0.6-1 units/ml 4hrs after LMWH dose given.      Plan:  1. Begin Lovenox 90 mg sq q 12 hours. 2. CBC q 3 days while on Lovenox.  Monitor for bleeding complications  3. Follow up renal function,   Jaxin Fulfer, [employer],  Pharm.D.. 05/15/2013,  12:55 PM

## 2013-05-15 NOTE — Progress Notes (Signed)
Patient ID: [name], male   DOB: 11/04/1940, 73 y.o.   MRN: [id number] Events described by dr Ellene Route over the weekend. Swallows better but still pain in shoulders. To start lovenox and dilaudid

## 2013-05-15 NOTE — Progress Notes (Signed)
Inpatient Diabetes Program Recommendations  AACE/ADA: New Consensus Statement on Inpatient Glycemic Control (2013)  Target Ranges:  Prepandial:   less than 140 mg/dL      Peak postprandial:   less than 180 mg/dL (1-2 hours)      Critically ill patients:  140 - 180 mg/dL   Especially while on IV Decadron q 6 hrs, please consider addition of basal insulin per below. Inpatient Diabetes Program Recommendations Insulin - Basal: Please add some basal insulin, Lantus or Levemir 15 units (0.2 units/kg) as correction alone is not controlling glucose. Oral Agents: Please do not use metformin while here.  The basal insulin will help with fasting glucose control HgbA1C: Please check current A1C-last one was in Jan of 2014 of 8.8%  Thank you, Rosita Kea, RN, CNS, Diabetes Coordinator [phone])

## 2013-05-16 LAB — PROTIME-INR
INR: 1.13 (ref 0.00–1.49)
Prothrombin Time: 14.3 seconds (ref 11.6–15.2)

## 2013-05-16 LAB — GLUCOSE, CAPILLARY
GLUCOSE-CAPILLARY: 200 mg/dL — AB (ref 70–99)
Glucose-Capillary: 189 mg/dL — ABNORMAL HIGH (ref 70–99)
Glucose-Capillary: 217 mg/dL — ABNORMAL HIGH (ref 70–99)
Glucose-Capillary: 185 mg/dL — ABNORMAL HIGH (ref 70–99)

## 2013-05-16 MED ORDER — OXYCODONE HCL 5 MG PO TABS
15.0000 mg | ORAL_TABLET | ORAL | Status: DC | PRN
  Administered 2013-05-16 – 2013-05-17 (×3): 15 mg via ORAL
  Filled 2013-05-16 (×3): qty 3

## 2013-05-16 NOTE — Progress Notes (Signed)
Patient ID: [name], male   DOB: December 23, 1940, 73 y.o.   MRN: [id number] Better, less swelling, better swallowing. Skin , dryer. Spoke with wife. Home in am

## 2013-05-17 LAB — PROTIME-INR
INR: 1.17 (ref 0.00–1.49)
Prothrombin Time: 14.7 seconds (ref 11.6–15.2)

## 2013-05-17 LAB — GLUCOSE, CAPILLARY
Glucose-Capillary: 209 mg/dL — ABNORMAL HIGH (ref 70–99)
Glucose-Capillary: 277 mg/dL — ABNORMAL HIGH (ref 70–99)

## 2013-05-17 NOTE — Progress Notes (Signed)
Pt A&O x4, spouse at bedside; pt discharge education and instructions completed with pt and spouse; both voices understanding and denies any questions. Pt prescription given to pt my MD and pt spouse confirms she has the prescription. Steristrips remains intact to pt neck and foam dsg to chest skin tears remains intact. Pt provided additional foam dsgs. Pt IV removed and pt transported off unit via wheelchair. Pt spouse left ahead to the car with pt belongings. Delia Heady RN

## 2013-05-17 NOTE — Discharge Summary (Signed)
Physician Discharge Summary  Patient ID: [name] MRN: [id number] DOB/AGE: 08-18-1940 73 y.o.  Admit date: 05/13/2013 Discharge date: 05/17/2013  Admission Diagnoses:cervical post-op edema. Cervical 3 to 6 fusion  Discharge Diagnoses:  Active Problems:   Post-op pain   Postoperative pain, acute, shoulder   Discharged Condition: less pain. Able to swallow  Hospital Course: observation  Consults: none  Significant Diagnostic Studies: cervical ct Treatments: ivf plus steroids  Discharge Exam: Blood pressure 114/76, pulse 100, temperature 98.8 F (37.1 C), temperature source Oral, resp. rate 16, height 6\' 3"  (1.905 m), weight 90.538 kg (199 lb 9.6 oz), SpO2 94.00%. No weakness. Skin better  Disposition: 01-Home or Self Care   Future Appointments Provider Department Dept Phone   05/24/2013 9:20 AM [facility] [phone]       Medication List    ASK your doctor about these medications       acetaminophen 500 MG tablet  Commonly known as:  TYLENOL  Take 1,000 mg by mouth every 6 (six) hours as needed for moderate pain.     ADVAIR DISKUS 250-50 MCG/DOSE Aepb  Generic drug:  Fluticasone-Salmeterol  Inhale 1 puff into the lungs 2 (two) times daily as needed (wheezing/sob).     ALPRAZolam 0.5 MG tablet  Commonly known as:  XANAX  Take 1 tablet (0.5 mg total) by mouth 2 (two) times daily as needed for anxiety.     atorvastatin 80 MG tablet  Commonly known as:  LIPITOR  Take 1 tablet (80 mg total) by mouth at bedtime.     Betamethasone Valerate 0.12 % foam  Apply 1 application topically every evening.     FLUoxetine 20 MG capsule  Commonly known as:  PROZAC  Take 1 capsule (20 mg total) by mouth daily.     furosemide 20 MG tablet  Commonly known as:  LASIX  Take 1-2  tablets (20-40  mg total) by mouth daily.     gabapentin 300 MG capsule  Commonly known as:  NEURONTIN  Take 300 mg by mouth daily.     l-methylfolate-B6-B12 3-35-2 MG Tabs  Commonly known as:  METANX  Take 1 tablet by mouth daily.     lisinopril 2.5 MG tablet  Commonly known as:  PRINIVIL,ZESTRIL  Take 2.5 mg by mouth daily.     metFORMIN 500 MG tablet  Commonly known as:  GLUCOPHAGE  Take 500 mg by mouth 2 (two) times daily with a meal.     naphazoline-pheniramine 0.025-0.3 % ophthalmic solution  Commonly known as:  NAPHCON-A  Place 1 drop into both eyes 2 (two) times daily as needed for irritation.     omeprazole 40 MG capsule  Commonly known as:  PRILOSEC  Take 40 mg by mouth 2 (two) times daily.     oxyCODONE-acetaminophen 5-325 MG per tablet  Commonly known as:  PERCOCET/ROXICET  Take 1-2 tablets by mouth every 4 (four) hours as needed for moderate pain.     silodosin 8 MG Caps capsule  Commonly known as:  RAPAFLO  Take 1 capsule (8 mg total) by mouth daily with breakfast.     triamcinolone cream 0.1 %  Commonly known as:  KENALOG  Apply 1 application topically 2 (two) times daily as needed (dry skin).     warfarin 2.5 MG tablet  Commonly known as:  COUMADIN  Take 2.5 mg by mouth daily.     zolpidem 5 MG tablet  Commonly known as:  AMBIEN  Take 5-10 mg  by mouth at bedtime as needed for sleep.      to go home. To see me in 2 weeks or before prn   Signed: Floyce Stakes 05/17/2013, 8:11 AM

## 2013-05-25 DIAGNOSIS — M4802 Spinal stenosis, cervical region: Secondary | ICD-10-CM | POA: Diagnosis not present

## 2013-05-25 DIAGNOSIS — M542 Cervicalgia: Secondary | ICD-10-CM | POA: Diagnosis not present

## 2013-05-28 ENCOUNTER — Other Ambulatory Visit: Payer: Self-pay | Admitting: Family Medicine

## 2013-05-28 NOTE — Telephone Encounter (Signed)
Last office visit 01/13/2013.  Not on current medication list.  Ok to refill?

## 2013-05-29 NOTE — Telephone Encounter (Signed)
[facility]. Gasper Sells, MA Student

## 2013-06-05 DIAGNOSIS — L98499 Non-pressure chronic ulcer of skin of other sites with unspecified severity: Secondary | ICD-10-CM | POA: Diagnosis not present

## 2013-06-05 DIAGNOSIS — Z85828 Personal history of other malignant neoplasm of skin: Secondary | ICD-10-CM | POA: Diagnosis not present

## 2013-06-09 ENCOUNTER — Telehealth: Payer: Self-pay | Admitting: Family Medicine

## 2013-06-09 DIAGNOSIS — M4802 Spinal stenosis, cervical region: Secondary | ICD-10-CM | POA: Diagnosis not present

## 2013-06-09 DIAGNOSIS — M542 Cervicalgia: Secondary | ICD-10-CM | POA: Diagnosis not present

## 2013-06-09 MED ORDER — FLUOXETINE HCL 40 MG PO CAPS: 40.0000 mg | ORAL_CAPSULE | Freq: Every day | ORAL | Status: DC

## 2013-06-09 NOTE — Telephone Encounter (Signed)
Pt's wife called to say that pt had cervical spine surgery and is under the care of Dr. Joya Salm.  While he has been taking very good care of him, the healing process has been slow and pt has developed a very "volatile" mind set.  She says he is not suicidal but is depressed.  She is requesting that Dr. Diona Browner increase his prozac dose x 30 days ONLY until he can get over this tough time.  Cb [id number].

## 2013-06-09 NOTE — Telephone Encounter (Signed)
He can increase prozac to 40 mg daily, but it will likely take several weeks to 1 month to become effective. I would recommend continuing at higher dose until at least 3 months out.

## 2013-06-09 NOTE — Telephone Encounter (Signed)
[name] notified as instructed by telephone.  Prescription for Prozac 40 mg sent to [facility].

## 2013-06-13 DIAGNOSIS — R293 Abnormal posture: Secondary | ICD-10-CM | POA: Diagnosis not present

## 2013-06-13 DIAGNOSIS — M25519 Pain in unspecified shoulder: Secondary | ICD-10-CM | POA: Diagnosis not present

## 2013-06-13 DIAGNOSIS — M2550 Pain in unspecified joint: Secondary | ICD-10-CM | POA: Diagnosis not present

## 2013-06-13 DIAGNOSIS — M25619 Stiffness of unspecified shoulder, not elsewhere classified: Secondary | ICD-10-CM | POA: Diagnosis not present

## 2013-06-14 ENCOUNTER — Telehealth: Payer: Self-pay | Admitting: *Deleted

## 2013-06-14 ENCOUNTER — Ambulatory Visit (INDEPENDENT_AMBULATORY_CARE_PROVIDER_SITE_OTHER): Payer: Medicare Other | Admitting: *Deleted

## 2013-06-14 ENCOUNTER — Ambulatory Visit (INDEPENDENT_AMBULATORY_CARE_PROVIDER_SITE_OTHER): Payer: Medicare Other

## 2013-06-14 DIAGNOSIS — Z7901 Long term (current) use of anticoagulants: Secondary | ICD-10-CM | POA: Diagnosis not present

## 2013-06-14 DIAGNOSIS — I824Y9 Acute embolism and thrombosis of unspecified deep veins of unspecified proximal lower extremity: Secondary | ICD-10-CM

## 2013-06-14 DIAGNOSIS — I4891 Unspecified atrial fibrillation: Secondary | ICD-10-CM | POA: Diagnosis not present

## 2013-06-14 DIAGNOSIS — Z5181 Encounter for therapeutic drug level monitoring: Secondary | ICD-10-CM

## 2013-06-14 DIAGNOSIS — I4821 Permanent atrial fibrillation: Secondary | ICD-10-CM

## 2013-06-14 LAB — PROTIME-INR
INR: 8.4 — AB (ref 0.8–1.2)
PROTHROMBIN TIME: 89 s — AB (ref 9.1–12.0)

## 2013-06-14 LAB — POCT INR

## 2013-06-14 NOTE — Telephone Encounter (Signed)
[facility] called with [facility]  INR 8.4 Pt 89.0

## 2013-06-14 NOTE — Telephone Encounter (Signed)
Result addressed, see anticoagulation note in Epic.  

## 2013-06-15 DIAGNOSIS — M2550 Pain in unspecified joint: Secondary | ICD-10-CM | POA: Diagnosis not present

## 2013-06-15 DIAGNOSIS — R293 Abnormal posture: Secondary | ICD-10-CM | POA: Diagnosis not present

## 2013-06-15 DIAGNOSIS — M25519 Pain in unspecified shoulder: Secondary | ICD-10-CM | POA: Diagnosis not present

## 2013-06-15 DIAGNOSIS — M25619 Stiffness of unspecified shoulder, not elsewhere classified: Secondary | ICD-10-CM | POA: Diagnosis not present

## 2013-06-20 DIAGNOSIS — R293 Abnormal posture: Secondary | ICD-10-CM | POA: Diagnosis not present

## 2013-06-20 DIAGNOSIS — M25519 Pain in unspecified shoulder: Secondary | ICD-10-CM | POA: Diagnosis not present

## 2013-06-20 DIAGNOSIS — M25619 Stiffness of unspecified shoulder, not elsewhere classified: Secondary | ICD-10-CM | POA: Diagnosis not present

## 2013-06-20 DIAGNOSIS — M2550 Pain in unspecified joint: Secondary | ICD-10-CM | POA: Diagnosis not present

## 2013-06-21 ENCOUNTER — Ambulatory Visit (INDEPENDENT_AMBULATORY_CARE_PROVIDER_SITE_OTHER): Payer: Medicare Other

## 2013-06-21 DIAGNOSIS — Z7901 Long term (current) use of anticoagulants: Secondary | ICD-10-CM

## 2013-06-21 DIAGNOSIS — I824Y9 Acute embolism and thrombosis of unspecified deep veins of unspecified proximal lower extremity: Secondary | ICD-10-CM | POA: Diagnosis not present

## 2013-06-21 DIAGNOSIS — I4891 Unspecified atrial fibrillation: Secondary | ICD-10-CM | POA: Diagnosis not present

## 2013-06-21 DIAGNOSIS — M2550 Pain in unspecified joint: Secondary | ICD-10-CM | POA: Diagnosis not present

## 2013-06-21 DIAGNOSIS — M25619 Stiffness of unspecified shoulder, not elsewhere classified: Secondary | ICD-10-CM | POA: Diagnosis not present

## 2013-06-21 DIAGNOSIS — M25519 Pain in unspecified shoulder: Secondary | ICD-10-CM | POA: Diagnosis not present

## 2013-06-21 DIAGNOSIS — Z5181 Encounter for therapeutic drug level monitoring: Secondary | ICD-10-CM

## 2013-06-21 DIAGNOSIS — R293 Abnormal posture: Secondary | ICD-10-CM | POA: Diagnosis not present

## 2013-06-21 LAB — POCT INR: INR: 1.9

## 2013-06-23 DIAGNOSIS — M25519 Pain in unspecified shoulder: Secondary | ICD-10-CM | POA: Diagnosis not present

## 2013-06-23 DIAGNOSIS — M25619 Stiffness of unspecified shoulder, not elsewhere classified: Secondary | ICD-10-CM | POA: Diagnosis not present

## 2013-06-23 DIAGNOSIS — M2550 Pain in unspecified joint: Secondary | ICD-10-CM | POA: Diagnosis not present

## 2013-06-23 DIAGNOSIS — R293 Abnormal posture: Secondary | ICD-10-CM | POA: Diagnosis not present

## 2013-06-28 DIAGNOSIS — M2550 Pain in unspecified joint: Secondary | ICD-10-CM | POA: Diagnosis not present

## 2013-06-28 DIAGNOSIS — R293 Abnormal posture: Secondary | ICD-10-CM | POA: Diagnosis not present

## 2013-06-28 DIAGNOSIS — M25519 Pain in unspecified shoulder: Secondary | ICD-10-CM | POA: Diagnosis not present

## 2013-06-28 DIAGNOSIS — M25619 Stiffness of unspecified shoulder, not elsewhere classified: Secondary | ICD-10-CM | POA: Diagnosis not present

## 2013-06-30 DIAGNOSIS — M2550 Pain in unspecified joint: Secondary | ICD-10-CM | POA: Diagnosis not present

## 2013-06-30 DIAGNOSIS — M25519 Pain in unspecified shoulder: Secondary | ICD-10-CM | POA: Diagnosis not present

## 2013-06-30 DIAGNOSIS — R293 Abnormal posture: Secondary | ICD-10-CM | POA: Diagnosis not present

## 2013-06-30 DIAGNOSIS — M25619 Stiffness of unspecified shoulder, not elsewhere classified: Secondary | ICD-10-CM | POA: Diagnosis not present

## 2013-07-04 ENCOUNTER — Other Ambulatory Visit: Payer: Self-pay | Admitting: Family Medicine

## 2013-07-04 DIAGNOSIS — R293 Abnormal posture: Secondary | ICD-10-CM | POA: Diagnosis not present

## 2013-07-04 DIAGNOSIS — Z85828 Personal history of other malignant neoplasm of skin: Secondary | ICD-10-CM | POA: Diagnosis not present

## 2013-07-04 DIAGNOSIS — L570 Actinic keratosis: Secondary | ICD-10-CM | POA: Diagnosis not present

## 2013-07-04 DIAGNOSIS — M25519 Pain in unspecified shoulder: Secondary | ICD-10-CM | POA: Diagnosis not present

## 2013-07-04 DIAGNOSIS — L98499 Non-pressure chronic ulcer of skin of other sites with unspecified severity: Secondary | ICD-10-CM | POA: Diagnosis not present

## 2013-07-04 DIAGNOSIS — M25619 Stiffness of unspecified shoulder, not elsewhere classified: Secondary | ICD-10-CM | POA: Diagnosis not present

## 2013-07-04 DIAGNOSIS — M2550 Pain in unspecified joint: Secondary | ICD-10-CM | POA: Diagnosis not present

## 2013-07-05 ENCOUNTER — Ambulatory Visit (INDEPENDENT_AMBULATORY_CARE_PROVIDER_SITE_OTHER): Payer: Medicare Other

## 2013-07-05 DIAGNOSIS — Z5181 Encounter for therapeutic drug level monitoring: Secondary | ICD-10-CM

## 2013-07-05 DIAGNOSIS — I824Y9 Acute embolism and thrombosis of unspecified deep veins of unspecified proximal lower extremity: Secondary | ICD-10-CM

## 2013-07-05 DIAGNOSIS — I4891 Unspecified atrial fibrillation: Secondary | ICD-10-CM | POA: Diagnosis not present

## 2013-07-05 DIAGNOSIS — M25619 Stiffness of unspecified shoulder, not elsewhere classified: Secondary | ICD-10-CM | POA: Diagnosis not present

## 2013-07-05 DIAGNOSIS — Z7901 Long term (current) use of anticoagulants: Secondary | ICD-10-CM | POA: Diagnosis not present

## 2013-07-05 DIAGNOSIS — M25519 Pain in unspecified shoulder: Secondary | ICD-10-CM | POA: Diagnosis not present

## 2013-07-05 DIAGNOSIS — R293 Abnormal posture: Secondary | ICD-10-CM | POA: Diagnosis not present

## 2013-07-05 DIAGNOSIS — M2550 Pain in unspecified joint: Secondary | ICD-10-CM | POA: Diagnosis not present

## 2013-07-05 LAB — POCT INR: INR: 3.8

## 2013-07-05 NOTE — Telephone Encounter (Signed)
Last office visit 01/13/2013.  Last refilled 05/29/2013 for #30.  Ok to refill?

## 2013-07-06 DIAGNOSIS — M4802 Spinal stenosis, cervical region: Secondary | ICD-10-CM | POA: Diagnosis not present

## 2013-07-06 DIAGNOSIS — Z6825 Body mass index (BMI) 25.0-25.9, adult: Secondary | ICD-10-CM | POA: Diagnosis not present

## 2013-07-06 NOTE — Telephone Encounter (Signed)
Called to [facility]. 

## 2013-07-07 DIAGNOSIS — R293 Abnormal posture: Secondary | ICD-10-CM | POA: Diagnosis not present

## 2013-07-07 DIAGNOSIS — M2550 Pain in unspecified joint: Secondary | ICD-10-CM | POA: Diagnosis not present

## 2013-07-07 DIAGNOSIS — M25519 Pain in unspecified shoulder: Secondary | ICD-10-CM | POA: Diagnosis not present

## 2013-07-07 DIAGNOSIS — M25619 Stiffness of unspecified shoulder, not elsewhere classified: Secondary | ICD-10-CM | POA: Diagnosis not present

## 2013-07-11 DIAGNOSIS — M25519 Pain in unspecified shoulder: Secondary | ICD-10-CM | POA: Diagnosis not present

## 2013-07-11 DIAGNOSIS — M2550 Pain in unspecified joint: Secondary | ICD-10-CM | POA: Diagnosis not present

## 2013-07-11 DIAGNOSIS — M25619 Stiffness of unspecified shoulder, not elsewhere classified: Secondary | ICD-10-CM | POA: Diagnosis not present

## 2013-07-11 DIAGNOSIS — R293 Abnormal posture: Secondary | ICD-10-CM | POA: Diagnosis not present

## 2013-07-12 ENCOUNTER — Ambulatory Visit: Payer: Self-pay | Admitting: Family Medicine

## 2013-07-12 VITALS — BP 120/70 | HR 83 | Temp 98.8°F | Resp 16 | Ht 72.0 in | Wt 204.4 lb

## 2013-07-12 DIAGNOSIS — M25619 Stiffness of unspecified shoulder, not elsewhere classified: Secondary | ICD-10-CM | POA: Diagnosis not present

## 2013-07-12 DIAGNOSIS — Z0289 Encounter for other administrative examinations: Secondary | ICD-10-CM

## 2013-07-12 DIAGNOSIS — M2550 Pain in unspecified joint: Secondary | ICD-10-CM | POA: Diagnosis not present

## 2013-07-12 DIAGNOSIS — E119 Type 2 diabetes mellitus without complications: Secondary | ICD-10-CM

## 2013-07-12 DIAGNOSIS — M25519 Pain in unspecified shoulder: Secondary | ICD-10-CM | POA: Diagnosis not present

## 2013-07-12 DIAGNOSIS — R293 Abnormal posture: Secondary | ICD-10-CM | POA: Diagnosis not present

## 2013-07-12 LAB — GLUCOSE, POCT (MANUAL RESULT ENTRY): POC GLUCOSE: 124 mg/dL — AB (ref 70–99)

## 2013-07-12 NOTE — Progress Notes (Signed)
[facility] [address], [place] [address] [phone]  Date:  07/12/2013   Name:  [name]   DOB:  [birth year]   MRN:  [id number]  PCP:  Eliezer Lofts, MD    Chief Complaint: Annual Exam   History of Present Illness:  [name] is a 73 y.o. 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   He is on coumadin for a fib.   His cardiologist is Dr. Alba Cory.  He has been in fib for "about 15 years" and is treated medically.  Sees Dr. Alba Cory about once a year and has regular INR checks.  Most recently his INR was 3.8  He was underwent a cervical fusion in April of this year.  He is feeling well after this operation.   He also had a left shoulder replacement 4 months ago.  He is still in PT for this.    He sees Dr. Lucinda Dell for his eyes- he had cataracts which are now removed and he has artificial lenses.  This was done about 5 years ago He has DM, but had gastric bypass about 4 years ago.  He is now managed by diet only. His HgA1c was 8.3 in January 2014.  No more recent A1c readings on file that I have on file but he states this has been done recently and that it was about 7%. He has a letter from his endocrinologist stating that he is no longer on insulin for DM  Asked him about his history of peripheral neuropathy. Apparently he first noted burning on the right side of his right foot first several years ago.  He is not on any medication for this and it is stable.  He does not notice any numbness in his feet.    Patient Active Problem List   Diagnosis Date Noted  . Post-op pain 05/13/2013  . Postoperative pain, acute, shoulder 05/13/2013  . Cervical stenosis of spinal canal 05/09/2013  . Encounter for therapeutic drug monitoring 04/26/2013  . Panniculitis 03/09/2013  . Shoulder arthritis 01/26/2013  . Peripheral neuropathy  12/11/2011  . 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 Atrial fibrillation, permanent 08/12/2011  . Bacterial overgrowth syndrome 10/28/2010  . Nonsurgical dumping syndrome 10/14/2010  . Diverticulitis of colon 09/02/2010  . Acute venous embolism and thrombosis of deep vessels of proximal lower extremity 08/01/2010  . OSTEOPOROSIS, DRUG-INDUCED 11/26/2009  . UNSPECIFIED ANEMIA 09/02/2009  . RENAL INSUFFICIENCY 09/02/2009  . ALLERGIC RHINITIS CAUSE UNSPECIFIED 01/17/2009  . ASTHMA, PERSISTENT, MILD 11/13/2008  . FOOT SURGERY, HX OF 12/18/2006  . KNEE REPLACEMENT, LEFT, HX OF 12/18/2006  . HERPES ZOSTER W/NERVOUS COMPLICATION NEC [id number]  . HYPERCHOLESTEROLEMIA 05/19/2006  . ANXIETY 05/19/2006  . DEPRESSION 05/19/2006  . RESTLESS LEG SYNDROME 05/19/2006  . HYPERTENSION 05/19/2006  . DIVERTICULOSIS, COLON 05/19/2006  . IRRITABLE BOWEL SYNDROME 05/19/2006  . LOW BACK PAIN, CHRONIC 05/19/2006    Past Medical History  Diagnosis Date  . Diverticulosis of colon (without mention of hemorrhage)   . Other pulmonary embolism and infarction   . Restless legs syndrome (RLS)   . Obesity, unspecified   . Gastroparesis   . Lumbago   . Anxiety state, unspecified   . Pure hypercholesterolemia     takes Atorvastatin daily  . Hemorrhoids   . Back pain, chronic   .  Hx of gout     but doesn't take any meds  . Bruises easily     pt is on Coumadin  . Cancer     SKIN CANCER REMOVED  . Esophageal reflux     takes Omeprazole daily  . Insomnia     takes Ambien as nightly as needed  . Atrial fibrillation     takes Coumadin daily at bedtime  . Unspecified essential hypertension     takes Lisinopril daily  . Depressive disorder, not elsewhere classified     takes Prozac daily  . History of blood clots     behind right knee and then went into left lung 35yrs ago  . Peripheral edema     takes Furosemide daily  . Unspecified asthma(493.90)     as a child  . History of bronchitis      spring of 2014--Advair prn  . Pneumonia     last time about 78yrs ago  . Arthritis   . Joint swelling   . Joint pain   . Chronic back pain   . History of colon polyps   . History of kidney stones   . Hard of hearing     wears hearing aids  . Type II or unspecified type diabetes mellitus without mention of complication, not stated as uncontrolled     not taking any medications at this time  . Dysrhythmia     Atrial Fibrillation  . Peripheral vascular disease     Past Surgical History  Procedure Laterality Date  . Knee surgery Right     x 4  . Artery repair      Left forearm  . Foot surgery      Left foot   . Replacement total knee Left     x 2  . Breath tek h pylori  01/08/2011    Procedure: BREATH TEK H PYLORI;  Surgeon: Pedro Earls, MD;  Location: [facility];  Service: General;  Laterality: N/A;  . Hand surgery      LEFT  . Leg surgery      FOR NECROTIZING FASCITIS L LEG AND GROIN  . 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 Shoulder arthroscopy    . Shoulder surgery Left 2014  . Back surgery      x 3  . Knee surgery Left     arthroscopy  . Eye surgery      cataract bil  . Injections in back      x 18  . Colonoscopy    . Cystoscopy    . Total shoulder arthroplasty Left 01/26/2013    Procedure: TOTAL SHOULDER ARTHROPLASTY;  Surgeon: Nita Sells, MD;  Location: [facility];  Service: Orthopedics;  Laterality: Left;  Left total shoulder arthroplasty  . Anterior cervical decomp/discectomy fusion N/A 05/09/2013    Procedure: ANTERIOR CERVICAL DECOMPRESSION/DISCECTOMY FUSION CERVICAL THREE-FOUR,CERVICAL FOUR-FIVE,CERVICAL FIVE-SIX;  Surgeon: Floyce Stakes, MD;  Location: [facility];  Service: Neurosurgery;  Laterality: N/A;    History  Substance Use Topics  . Smoking status: Never Smoker   .  Smokeless tobacco: Never Used  . Alcohol Use: No    Family History  Problem Relation Age of Onset  . Uterine cancer Mother     mets  . Breast cancer Mother   . Cancer Mother  cervical  . Emphysema Father   . Alzheimer's disease Sister   . Prostate cancer Brother   . Heart attack Brother   . Colon cancer Brother 23    Allergies  Allergen Reactions  . Sulfacetamide Sodium     REACTION: Throat swelling  . Adhesive [Tape]     Tears skin    Medication list has been reviewed and updated.  Current Outpatient Prescriptions on File Prior to Visit  Medication Sig Dispense Refill  . atorvastatin (LIPITOR) 80 MG tablet Take 1 tablet (80 mg total) by mouth at bedtime.  90 tablet  3  . FLUoxetine (PROZAC) 40 MG capsule Take 1 capsule (40 mg total) by mouth daily.  30 capsule  2  . furosemide (LASIX) 20 MG tablet Take 1-2  tablets (20-40  mg total) by mouth daily.  135 tablet  3  . l-methylfolate-B6-B12 (METANX) 3-35-2 MG TABS Take 1 tablet by mouth daily.      [name] lisinopril (PRINIVIL,ZESTRIL) 2.5 MG tablet Take 2.5 mg by mouth daily.      [name] omeprazole (PRILOSEC) 40 MG capsule Take 40 mg by mouth 2 (two) times daily.      . silodosin (RAPAFLO) 8 MG CAPS capsule Take 1 capsule (8 mg total) by mouth daily with breakfast.  90 capsule  3  . triamcinolone cream (KENALOG) 0.1 % Apply 1 application topically 2 (two) times daily as needed (dry skin).       [name] warfarin (COUMADIN) 2.5 MG tablet Take 2.5 mg by mouth daily.       No current facility-administered medications on file prior to visit.    Review of Systems:  As per HPI- otherwise negative.   Physical Examination: Filed Vitals:   07/12/13 1023  BP: 120/70  Pulse: 83  Temp: 98.8 F (37.1 C)  Resp: 16   Filed Vitals:   07/12/13 1023  Height: 6' (1.829 m)  Weight: 204 lb 6.4 oz (92.715 kg)   Body mass index is 27.72 kg/(m^2). Ideal Body Weight: Weight in (lb) to have BMI = 25: 183.9  GEN: WDWN, NAD, Non-toxic, A & O x  3, older gentleman who looks well HEENT: Atraumatic, Normocephalic. Neck supple. No masses, No LAD.  Bilateral TM wnl, oropharynx normal.  PEERL,EOMI.   Ears and Nose: No external deformity. CV: RRR, No M/G/R. No JVD. No thrill. No extra heart sounds. PULM: CTA B, no wheezes, crackles, rhonchi. No retractions. No resp. distress. No accessory muscle use. ABD: S, NT, ND. No rebound. No HSM. EXTR: No c/c/e.  Fine tremor at baseline.   NEURO Normal gait for age, moves reasonably fast.  Normal strength, sensation and DTR all extremities  PSYCH: Normally interactive. Conversant. Not depressed or anxious appearing.  Calm demeanor.  Negative hernia check Tested feet with monofilament. He has slight loss of sensation over the 5th MT left foot; can feel filament but not as strongly as elsewhere on his feet..  Otherwise he has normal foot sensation.   He is able to rotate his neck to about 40 degrees bilaterally.  Good strength of UE, although he cannot raise his left arm above the shoulder yet (recent shoulder replacement).   Results for orders placed in visit on 07/12/13  GLUCOSE, POCT (MANUAL RESULT ENTRY)      Result Value Ref Range   POC Glucose 124 (*) 70 - 99 mg/dl    Assessment and Plan: Type II or unspecified type diabetes mellitus without mention of complication, not stated as uncontrolled -  Plan: POCT glucose (manual entry)   Here today for a DOT exam.  He does have multiple medical problems but none are disqualifying  From an a fib standpoint he can be certified for one year as he is adequatlry anticoagulated, rate contorlled and managed by cardiology.  HTN: his BP is well controlled Vision is ok, hearing ok (did not use his hearing aids for exam) Dm: he reports a recent normal A1c although I cannot see this.  Glucose today 124 which is acceptable. No longer on insulin since his bypass.   Recent left shoulder surgery: he has adequate strength and ROM of the left shoulder for driving.    Cervical fusion: he has good peripheral vision and adequate ROM in his neck for driving.  Signed Lamar Blinks, MD

## 2013-07-12 NOTE — Patient Instructions (Signed)
Your blood pressure and sugar look fine today.  You are cleared for a one year card.

## 2013-07-13 ENCOUNTER — Encounter: Payer: Self-pay | Admitting: Family Medicine

## 2013-07-13 ENCOUNTER — Ambulatory Visit (INDEPENDENT_AMBULATORY_CARE_PROVIDER_SITE_OTHER): Payer: Medicare Other | Admitting: Family Medicine

## 2013-07-13 ENCOUNTER — Telehealth: Payer: Self-pay | Admitting: Family Medicine

## 2013-07-13 VITALS — BP 137/89 | HR 76 | Temp 97.5°F | Ht 72.0 in | Wt 206.0 lb

## 2013-07-13 DIAGNOSIS — E7800 Pure hypercholesterolemia, unspecified: Secondary | ICD-10-CM

## 2013-07-13 DIAGNOSIS — I4821 Permanent atrial fibrillation: Secondary | ICD-10-CM

## 2013-07-13 DIAGNOSIS — J449 Chronic obstructive pulmonary disease, unspecified: Secondary | ICD-10-CM | POA: Insufficient documentation

## 2013-07-13 DIAGNOSIS — I4891 Unspecified atrial fibrillation: Secondary | ICD-10-CM

## 2013-07-13 DIAGNOSIS — I10 Essential (primary) hypertension: Secondary | ICD-10-CM | POA: Diagnosis not present

## 2013-07-13 DIAGNOSIS — F329 Major depressive disorder, single episode, unspecified: Secondary | ICD-10-CM | POA: Diagnosis not present

## 2013-07-13 DIAGNOSIS — F3289 Other specified depressive episodes: Secondary | ICD-10-CM | POA: Diagnosis not present

## 2013-07-13 NOTE — Assessment & Plan Note (Signed)
Stable no flares.

## 2013-07-13 NOTE — Progress Notes (Signed)
73 year old male with history of Afib, COPD, asthma, renal insufficiency, DM presents for follow up. Appears it should have been scheduled as medicare wellness but was not.   Since last CZ:YSAY spur removal in 2014, no improvement in pain. Left shoulder replacement...  12/18.  Cervical fusion 05/2013 He is in middle of PT.  Has difficulty swallowing. Dr. Joya Salm believes there is a  fragment of post surgical  Bag in neck that needs to be removed. PER pt.  He is on coumadin for a fib. His cardiologist is Dr. Alba Cory. He has been in fib for "about 15 years" and is treated medically. Sees Dr. Alba Cory about once a year and has regular INR checks. Most recently his INR was 3.8   DM.[name] Inadequate control with peripheral neuropathy followed by ENDO   80-100 fasting and  134 nonfasting.  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 Last OV note on record from Dr. Buddy Duty 02/2013 A1C was 8.3 Has next follow up in next month.  COPD stable, No recent exacerbations.   HTN, well controlled on current regimen BP Readings from Last 3 Encounters:  07/13/13 137/89  07/12/13 120/70  05/17/13 114/76  Using medication without problems or lightheadedness: None Chest pain with exertion:None Edema:None Short of breath:None Average home BPs: Other issues:  Elevated Cholesterol: well controlled LDL at goal < 100 at last check on lipitor Lab Results  Component Value Date   CHOL 115 04/14/2013   HDL 43.00 04/14/2013   LDLCALC 52 04/14/2013   LDLDIRECT 147.0 03/07/2012   TRIG 101.0 04/14/2013   CHOLHDL 3 04/14/2013  Using medications without problems:None Muscle aches: None Diet compliance: moderate Exercise:running 20 min a day Other complaints: Wt Readings from Last 3 Encounters:  07/13/13 206 lb (93.441 kg)  07/12/13 204 lb 6.4 oz (92.715 kg)  05/13/13 199 lb 9.6 oz (90.538 kg)    Anxiety/depression, stable control on prozac,  No HI, NO SI.   Review of Systems  Constitutional: Negative for fever, fatigue and  unexpected weight change.  HENT: Negative for congestion, ear pain, postnasal drip, rhinorrhea, sore throat and trouble swallowing.  Eyes: Negative for pain.  Respiratory: Negative for cough, shortness of breath and wheezing.  Cardiovascular: Negative for chest pain, palpitations and leg swelling.  Gastrointestinal: Negative for nausea, abdominal pain, diarrhea, constipation and blood in stool.  Genitourinary: Negative for dysuria, urgency, hematuria, discharge, penile swelling, scrotal swelling, difficulty urinating, penile pain and testicular pain.  Skin: Negative for rash.  Neurological: Negative for syncope, weakness, light-headedness, numbness and headaches.  Psychiatric/Behavioral: Negative for behavioral problems and dysphoric mood. The patient is not nervous/anxious.  Objective:   Physical Exam  Constitutional: He appears well-developed and well-nourished. Non-toxic appearance. He does not appear ill. No distress.  HENT:  Head: Normocephalic and atraumatic.  Right Ear: Hearing, tympanic membrane, external ear and ear canal normal.  Left Ear: Hearing, tympanic membrane, external ear and ear canal normal.  Nose: Nose normal.  Mouth/Throat: Uvula is midline, oropharynx is clear and moist and mucous membranes are normal.  Eyes: Conjunctivae, EOM and lids are normal. Pupils are equal, round, and reactive to light. Lids are everted and swept, no foreign bodies found.  Neck: Trachea normal, normal range of motion and phonation normal. Neck supple. Carotid bruit is not present. No mass and no thyromegaly present.  Cardiovascular: Normal rate, regular rhythm, S1 normal, S2 normal, intact distal pulses and normal pulses. Exam reveals no gallop.  No murmur heard.  Pulmonary/Chest: Breath sounds normal.  He has no wheezes. He has no rhonchi. He has no rales.  Abdominal: Soft. Normal appearance and bowel sounds are normal. There is no hepatosplenomegaly. There is no tenderness. There is no rebound,  no guarding and no CVA tenderness. No hernia. Hernia confirmed negative in the right inguinal area and confirmed negative in the left inguinal area.  Lymphadenopathy:  He has no cervical adenopathy.  Right: No inguinal adenopathy present.  Left: No inguinal adenopathy present.  Neurological:  Resting tremor. He is alert. He has normal strength and normal reflexes. No cranial nerve deficit or sensory deficit. Gait normal.  Skin: Skin is warm, dry and intact. No rash noted.  Healing scar on right neck Psychiatric: He has a normal mood and affect. His speech is normal and behavior is normal. Judgment normal.

## 2013-07-13 NOTE — Assessment & Plan Note (Signed)
Followed by Cards 

## 2013-07-13 NOTE — Progress Notes (Signed)
Pre visit review using our clinic review tool, if applicable. No additional management support is needed unless otherwise documented below in the visit note. 

## 2013-07-13 NOTE — Patient Instructions (Addendum)
Schedule medicare wellness (preventative visit) in 6 months with  labs prior.

## 2013-07-13 NOTE — Telephone Encounter (Signed)
Relevant patient education assigned to patient using Emmi. ° °

## 2013-07-13 NOTE — Assessment & Plan Note (Signed)
Well controlled.  Significant improvement from last year Continue current medication.

## 2013-07-13 NOTE — Assessment & Plan Note (Signed)
Well controlled. Continue current medication.  

## 2013-07-13 NOTE — Assessment & Plan Note (Signed)
Well controlled on prozac. 

## 2013-07-14 DIAGNOSIS — R293 Abnormal posture: Secondary | ICD-10-CM | POA: Diagnosis not present

## 2013-07-14 DIAGNOSIS — M25619 Stiffness of unspecified shoulder, not elsewhere classified: Secondary | ICD-10-CM | POA: Diagnosis not present

## 2013-07-14 DIAGNOSIS — M25519 Pain in unspecified shoulder: Secondary | ICD-10-CM | POA: Diagnosis not present

## 2013-07-14 DIAGNOSIS — M2550 Pain in unspecified joint: Secondary | ICD-10-CM | POA: Diagnosis not present

## 2013-07-19 DIAGNOSIS — M25519 Pain in unspecified shoulder: Secondary | ICD-10-CM | POA: Diagnosis not present

## 2013-07-19 DIAGNOSIS — M2550 Pain in unspecified joint: Secondary | ICD-10-CM | POA: Diagnosis not present

## 2013-07-19 DIAGNOSIS — R293 Abnormal posture: Secondary | ICD-10-CM | POA: Diagnosis not present

## 2013-07-19 DIAGNOSIS — M25619 Stiffness of unspecified shoulder, not elsewhere classified: Secondary | ICD-10-CM | POA: Diagnosis not present

## 2013-07-21 DIAGNOSIS — M2550 Pain in unspecified joint: Secondary | ICD-10-CM | POA: Diagnosis not present

## 2013-07-21 DIAGNOSIS — M25619 Stiffness of unspecified shoulder, not elsewhere classified: Secondary | ICD-10-CM | POA: Diagnosis not present

## 2013-07-21 DIAGNOSIS — R293 Abnormal posture: Secondary | ICD-10-CM | POA: Diagnosis not present

## 2013-07-21 DIAGNOSIS — M25519 Pain in unspecified shoulder: Secondary | ICD-10-CM | POA: Diagnosis not present

## 2013-07-25 DIAGNOSIS — M25619 Stiffness of unspecified shoulder, not elsewhere classified: Secondary | ICD-10-CM | POA: Diagnosis not present

## 2013-07-25 DIAGNOSIS — M2550 Pain in unspecified joint: Secondary | ICD-10-CM | POA: Diagnosis not present

## 2013-07-25 DIAGNOSIS — R293 Abnormal posture: Secondary | ICD-10-CM | POA: Diagnosis not present

## 2013-07-25 DIAGNOSIS — M25519 Pain in unspecified shoulder: Secondary | ICD-10-CM | POA: Diagnosis not present

## 2013-07-26 ENCOUNTER — Ambulatory Visit (INDEPENDENT_AMBULATORY_CARE_PROVIDER_SITE_OTHER): Payer: Medicare Other

## 2013-07-26 DIAGNOSIS — M25619 Stiffness of unspecified shoulder, not elsewhere classified: Secondary | ICD-10-CM | POA: Diagnosis not present

## 2013-07-26 DIAGNOSIS — I824Y9 Acute embolism and thrombosis of unspecified deep veins of unspecified proximal lower extremity: Secondary | ICD-10-CM

## 2013-07-26 DIAGNOSIS — M25519 Pain in unspecified shoulder: Secondary | ICD-10-CM | POA: Diagnosis not present

## 2013-07-26 DIAGNOSIS — Z7901 Long term (current) use of anticoagulants: Secondary | ICD-10-CM | POA: Diagnosis not present

## 2013-07-26 DIAGNOSIS — I4891 Unspecified atrial fibrillation: Secondary | ICD-10-CM | POA: Diagnosis not present

## 2013-07-26 DIAGNOSIS — Z5181 Encounter for therapeutic drug level monitoring: Secondary | ICD-10-CM | POA: Diagnosis not present

## 2013-07-26 DIAGNOSIS — M2550 Pain in unspecified joint: Secondary | ICD-10-CM | POA: Diagnosis not present

## 2013-07-26 DIAGNOSIS — R293 Abnormal posture: Secondary | ICD-10-CM | POA: Diagnosis not present

## 2013-07-26 LAB — POCT INR: INR: 2.1

## 2013-07-27 DIAGNOSIS — R269 Unspecified abnormalities of gait and mobility: Secondary | ICD-10-CM | POA: Diagnosis not present

## 2013-07-27 DIAGNOSIS — R1313 Dysphagia, pharyngeal phase: Secondary | ICD-10-CM | POA: Diagnosis not present

## 2013-07-28 DIAGNOSIS — R293 Abnormal posture: Secondary | ICD-10-CM | POA: Diagnosis not present

## 2013-07-28 DIAGNOSIS — M25519 Pain in unspecified shoulder: Secondary | ICD-10-CM | POA: Diagnosis not present

## 2013-07-28 DIAGNOSIS — M2550 Pain in unspecified joint: Secondary | ICD-10-CM | POA: Diagnosis not present

## 2013-07-28 DIAGNOSIS — M25619 Stiffness of unspecified shoulder, not elsewhere classified: Secondary | ICD-10-CM | POA: Diagnosis not present

## 2013-07-31 ENCOUNTER — Other Ambulatory Visit: Payer: Self-pay | Admitting: Otolaryngology

## 2013-07-31 DIAGNOSIS — Z85828 Personal history of other malignant neoplasm of skin: Secondary | ICD-10-CM | POA: Diagnosis not present

## 2013-07-31 DIAGNOSIS — L570 Actinic keratosis: Secondary | ICD-10-CM | POA: Diagnosis not present

## 2013-07-31 DIAGNOSIS — R1313 Dysphagia, pharyngeal phase: Secondary | ICD-10-CM

## 2013-07-31 DIAGNOSIS — L98499 Non-pressure chronic ulcer of skin of other sites with unspecified severity: Secondary | ICD-10-CM | POA: Diagnosis not present

## 2013-07-31 DIAGNOSIS — A630 Anogenital (venereal) warts: Secondary | ICD-10-CM | POA: Diagnosis not present

## 2013-08-01 DIAGNOSIS — R293 Abnormal posture: Secondary | ICD-10-CM | POA: Diagnosis not present

## 2013-08-01 DIAGNOSIS — M25619 Stiffness of unspecified shoulder, not elsewhere classified: Secondary | ICD-10-CM | POA: Diagnosis not present

## 2013-08-01 DIAGNOSIS — M2550 Pain in unspecified joint: Secondary | ICD-10-CM | POA: Diagnosis not present

## 2013-08-01 DIAGNOSIS — M25519 Pain in unspecified shoulder: Secondary | ICD-10-CM | POA: Diagnosis not present

## 2013-08-02 ENCOUNTER — Other Ambulatory Visit: Payer: Self-pay | Admitting: Family Medicine

## 2013-08-02 DIAGNOSIS — M25519 Pain in unspecified shoulder: Secondary | ICD-10-CM | POA: Diagnosis not present

## 2013-08-02 DIAGNOSIS — M25619 Stiffness of unspecified shoulder, not elsewhere classified: Secondary | ICD-10-CM | POA: Diagnosis not present

## 2013-08-02 DIAGNOSIS — R293 Abnormal posture: Secondary | ICD-10-CM | POA: Diagnosis not present

## 2013-08-02 DIAGNOSIS — M19019 Primary osteoarthritis, unspecified shoulder: Secondary | ICD-10-CM | POA: Diagnosis not present

## 2013-08-02 DIAGNOSIS — M2550 Pain in unspecified joint: Secondary | ICD-10-CM | POA: Diagnosis not present

## 2013-08-02 NOTE — Telephone Encounter (Signed)
Last office visit 07/13/2013.  Not on current medication list.  Ok to refill?

## 2013-08-03 NOTE — Telephone Encounter (Signed)
[facility] to [facility] [place].

## 2013-08-04 ENCOUNTER — Ambulatory Visit
Admission: RE | Admit: 2013-08-04 | Discharge: 2013-08-04 | Disposition: A | Discharge status: Home / Self Care | Payer: Medicare Other | Source: Ambulatory Visit | Attending: Otolaryngology | Admitting: Otolaryngology

## 2013-08-04 DIAGNOSIS — M25619 Stiffness of unspecified shoulder, not elsewhere classified: Secondary | ICD-10-CM | POA: Diagnosis not present

## 2013-08-04 DIAGNOSIS — M2550 Pain in unspecified joint: Secondary | ICD-10-CM | POA: Diagnosis not present

## 2013-08-04 DIAGNOSIS — M25519 Pain in unspecified shoulder: Secondary | ICD-10-CM | POA: Diagnosis not present

## 2013-08-04 DIAGNOSIS — R1310 Dysphagia, unspecified: Secondary | ICD-10-CM | POA: Diagnosis not present

## 2013-08-04 DIAGNOSIS — R293 Abnormal posture: Secondary | ICD-10-CM | POA: Diagnosis not present

## 2013-08-04 DIAGNOSIS — R1313 Dysphagia, pharyngeal phase: Secondary | ICD-10-CM

## 2013-08-08 DIAGNOSIS — M25619 Stiffness of unspecified shoulder, not elsewhere classified: Secondary | ICD-10-CM | POA: Diagnosis not present

## 2013-08-08 DIAGNOSIS — M2550 Pain in unspecified joint: Secondary | ICD-10-CM | POA: Diagnosis not present

## 2013-08-08 DIAGNOSIS — M25519 Pain in unspecified shoulder: Secondary | ICD-10-CM | POA: Diagnosis not present

## 2013-08-08 DIAGNOSIS — R293 Abnormal posture: Secondary | ICD-10-CM | POA: Diagnosis not present

## 2013-08-09 DIAGNOSIS — M25519 Pain in unspecified shoulder: Secondary | ICD-10-CM | POA: Diagnosis not present

## 2013-08-09 DIAGNOSIS — M2550 Pain in unspecified joint: Secondary | ICD-10-CM | POA: Diagnosis not present

## 2013-08-09 DIAGNOSIS — R293 Abnormal posture: Secondary | ICD-10-CM | POA: Diagnosis not present

## 2013-08-09 DIAGNOSIS — M25619 Stiffness of unspecified shoulder, not elsewhere classified: Secondary | ICD-10-CM | POA: Diagnosis not present

## 2013-08-10 DIAGNOSIS — M2550 Pain in unspecified joint: Secondary | ICD-10-CM | POA: Diagnosis not present

## 2013-08-10 DIAGNOSIS — R293 Abnormal posture: Secondary | ICD-10-CM | POA: Diagnosis not present

## 2013-08-10 DIAGNOSIS — M25619 Stiffness of unspecified shoulder, not elsewhere classified: Secondary | ICD-10-CM | POA: Diagnosis not present

## 2013-08-10 DIAGNOSIS — M25519 Pain in unspecified shoulder: Secondary | ICD-10-CM | POA: Diagnosis not present

## 2013-08-10 DIAGNOSIS — M542 Cervicalgia: Secondary | ICD-10-CM | POA: Diagnosis not present

## 2013-08-10 DIAGNOSIS — Z6825 Body mass index (BMI) 25.0-25.9, adult: Secondary | ICD-10-CM | POA: Diagnosis not present

## 2013-08-10 DIAGNOSIS — M4802 Spinal stenosis, cervical region: Secondary | ICD-10-CM | POA: Diagnosis not present

## 2013-08-11 ENCOUNTER — Other Ambulatory Visit (HOSPITAL_COMMUNITY): Payer: Self-pay | Admitting: Otolaryngology

## 2013-08-11 DIAGNOSIS — R1310 Dysphagia, unspecified: Secondary | ICD-10-CM

## 2013-08-14 ENCOUNTER — Ambulatory Visit (HOSPITAL_COMMUNITY)
Admission: RE | Admit: 2013-08-14 | Discharge: 2013-08-14 | Disposition: A | Discharge status: Home / Self Care | Payer: Medicare Other | Source: Ambulatory Visit | Attending: Otolaryngology | Admitting: Otolaryngology

## 2013-08-14 DIAGNOSIS — R1310 Dysphagia, unspecified: Secondary | ICD-10-CM

## 2013-08-14 NOTE — Procedures (Signed)
Objective Swallowing Evaluation: Modified Barium Swallowing Study  Patient Details  Name: [name] MRN: [id number] Date of Birth: [birth year]  Today's Date: 08/14/2013 Time: 1400-1455 SLP Time Calculation (min): 38 min  Past Medical History:  Past Medical History  Diagnosis Date  . Diverticulosis of colon (without mention of hemorrhage)   . Other pulmonary embolism and infarction   . Restless legs syndrome (RLS)   . Obesity, unspecified   . Gastroparesis   . Lumbago   . Anxiety state, unspecified   . Pure hypercholesterolemia     takes Atorvastatin daily  . Hemorrhoids   . Back pain, chronic   . Hx of gout     but doesn't take any meds  . Bruises easily     pt is on Coumadin  . Cancer     SKIN CANCER REMOVED  . Esophageal reflux     takes Omeprazole daily  . Insomnia     takes Ambien as nightly as needed  . Atrial fibrillation     takes Coumadin daily at bedtime  . Unspecified essential hypertension     takes Lisinopril daily  . Depressive disorder, not elsewhere classified     takes Prozac daily  . History of blood clots     behind right knee and then went into left lung 41yrs ago  . Peripheral edema     takes Furosemide daily  . Unspecified asthma(493.90)     as a child  . History of bronchitis     spring of 2014--Advair prn  . Pneumonia     last time about 46yrs ago  . Arthritis   . Joint swelling   . Joint pain   . Chronic back pain   . History of colon polyps   . History of kidney stones   . Hard of hearing     wears hearing aids  . Type II or unspecified type diabetes mellitus without mention of complication, not stated as uncontrolled     not taking any medications at this time  . Dysrhythmia     Atrial Fibrillation  . Peripheral vascular disease    Past Surgical History:  Past Surgical History  Procedure Laterality Date  . Knee surgery Right     x 4  . Artery repair      Left forearm  . Foot surgery      Left foot   . Replacement  total knee Left     x 2  . Breath tek h pylori  01/08/2011    Procedure: BREATH TEK H PYLORI;  Surgeon: Pedro Earls, MD;  Location: [facility];  Service: General;  Laterality: N/A;  . Hand surgery      LEFT  . Leg surgery      FOR NECROTIZING FASCITIS L LEG AND GROIN  . 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 Shoulder arthroscopy    . Shoulder surgery Left 2014  . Back surgery      x 3  . Knee surgery Left     arthroscopy  . Eye surgery      cataract bil  . Injections in back      x 18  . Colonoscopy    . Cystoscopy    . Total shoulder arthroplasty Left 01/26/2013    Procedure: TOTAL SHOULDER ARTHROPLASTY;  Surgeon: Francesco Runner  Tamera Punt, MD;  Location: [facility];  Service: Orthopedics;  Laterality: Left;  Left total shoulder arthroplasty  . Anterior cervical decomp/discectomy fusion N/A 05/09/2013    Procedure: ANTERIOR CERVICAL DECOMPRESSION/DISCECTOMY FUSION CERVICAL THREE-FOUR,CERVICAL FOUR-FIVE,CERVICAL FIVE-SIX;  Surgeon: Floyce Stakes, MD;  Location: [facility];  Service: Neurosurgery;  Laterality: N/A;   HPI:  73 yo male referred by Dr Redmond Baseman for MBS due to pt c/o dysphagia and having asymptomatic aspiration and stasis on esophagram 08/04/2013.  Pt PMH + for COPD, asthma, hiatal hernia, reflux, resting tremor.  PSH + for gastric bypass, ACDF C3-C6 April 2015.  Pt reports having to regurgitate food and drink and food sticking in throat when trying to eat.  He reports weight loss from 208 in April to 199 currently but denies any pnas, chronic colds, needed heimlich manuevers,etc. Pt did state he no longer feels comfortable eating at restaurants due to his dysphagia, ? edema and having to spit up food.       Assessment / Plan / Recommendation Clinical Impression  Dysphagia Diagnosis: Severe cervical esophageal phase  dysphagia;Moderate pharyngeal phase dysphagia;Severe pharyngeal phase dysphagia Clinical impression: Moderately severe pharyngeal and cervical esophageal dysphagia with sensorimotor deficits.  Decreased laryngeal elevation/closure, poor UES opening and poor tongue base retraction allows significant pharyngeal stasis (vallecular more than pyriform).  Dry swallows (up to 4 with each bolus) and liquid swallows decrease amount of stasis but do not eliminate it.  Masticated cracker did not transit into esophagus AT ALL and remained lodged at pharyngeal tongue base/vallecular space with swallow attempts- finally pt expectorated per SLP verbal cue.  Pt did tracely aspirate a small amount of thin liquid but cued cough cleared aspirates.   Various postures including head turn right/left, chin tuck did not improve swallowing (pt reports he was liberated from head movement restrictions by neuro surgeon).  SLP questions if ongoing edema impacting pharyngeal and cervical esophageal clearance.  As liquids appear easier for pt to clear, recommend consider obtaining primary nutrition via liquid supplements with moist solid consumption for pleasure.  Medications should be crushed or via liquid form if not contraindicated due to poor UES clearance.  Eating is laborious for this pt and concern is present for him to maintain nutrition.  Using video monitor and verbal feedback, extensive education provided.  Suspect if edema to decrease, his swallow may improve significantly.  Defer to primary MD for SLP follow up indication, recommend nutrition follow up to help pt maximize nutrition.       Treatment Recommendation       Diet Recommendation Nectar-thick liquid;Thin liquid;Dysphagia 2 (Fine chop) (masticate moist food til pulverized)   Medication Administration:  (crush with liquids) Supervision: Patient able to self feed Compensations: Slow rate;Small sips/bites;Follow solids with liquid;Multiple dry swallows after each  bite/sip;Hard cough after swallow (expectorate t/o meal) Postural Changes and/or Swallow Maneuvers: Seated upright 90 degrees;Upright 30-60 min after meal    Other  Recommendations Recommended Consults:  (Recommend dietician consult to maximize nutrition with liquids due to increased ease of swallowing) Oral Care Recommendations:  (pt to known heimlich manuever)   Follow Up Recommendations  None      General Date of Onset: 08/14/13 HPI: 73 yo male referred by Dr Redmond Baseman for MBS due to pt c/o dysphagia and having asymptomatic aspiration and stasis on esophagram 08/04/2013.  Pt PMH + for COPD, asthma, hiatal hernia, reflux, resting tremor.  PSH + for gastric bypass, ACDF C3-C6 April 2015.  Pt reports having to regurgitate food and  drink and food sticking in throat when trying to eat.  He reports weight loss from 208 in April to 199 currently but denies any pnas, chronic colds, needed heimlich manuevers,etc. Pt did state he no longer feels comfortable eating at restaurants due to his dysphagia, ? edema and having to spit up food.   Type of Study: Modified Barium Swallowing Study Reason for Referral: Objectively evaluate swallowing function Previous Swallow Assessment: Esophagram 08/04/2013 revealed repeated aspiration with thick barium, prominent pooling in the valleculae and piriform sinuses even after repeated swallows, prevertebral soft tissue swelling from C2-C3 through C6 decreased since prior CT 05/13/2013 but still greater than expected at this post op stage.  Findings consistent with collape at C3-C4, C4-C5, C5-C6. [name]   Temperature Spikes Noted: No Respiratory Status: Room air History of Recent Intubation: No Behavior/Cognition: Alert;Cooperative;Pleasant mood Oral Cavity - Dentition: Adequate natural dentition Oral Motor / Sensory Function: Within functional limits Self-Feeding Abilities: Able to feed self Patient Positioning: Upright in chair Baseline Vocal Quality: Clear Volitional Cough:  Strong Volitional Swallow: Able to elicit Anatomy: Other (Comment) (new slight kyphosis at C3-C4, C4-C5 per radiology report 08/04/13) Pharyngeal Secretions: Not observed secondary MBS    Reason for Referral Objectively evaluate swallowing function   Oral Phase Oral Preparation/Oral Phase Oral Phase: WFL (pt takes small bites/sips to accommodate his dysphagia) Oral - Nectar Oral - Nectar Teaspoon: Within functional limits Oral - Nectar Cup: Within functional limits Oral - Thin Oral - Thin Teaspoon: Within functional limits Oral - Thin Cup: Within functional limits Oral - Thin Straw: Within functional limits Oral - Solids Oral - Puree: Within functional limits Oral - Mechanical Soft: Within functional limits   Pharyngeal Phase Pharyngeal Phase Pharyngeal Phase: Impaired Pharyngeal - Nectar Pharyngeal - Nectar Teaspoon: Reduced laryngeal elevation;Reduced epiglottic inversion;Reduced tongue base retraction Pharyngeal - Nectar Cup: Reduced laryngeal elevation;Reduced epiglottic inversion;Reduced tongue base retraction Pharyngeal - Thin Pharyngeal - Thin Teaspoon: Reduced laryngeal elevation;Reduced epiglottic inversion;Reduced tongue base retraction Pharyngeal - Thin Cup: Reduced laryngeal elevation;Reduced epiglottic inversion;Reduced tongue base retraction;Trace aspiration;Penetration/Aspiration during swallow Penetration/Aspiration details (thin cup): Material enters airway, passes BELOW cords without attempt by patient to eject out (silent aspiration) (trace aspiration cleared with cued cough) Pharyngeal - Thin Straw: Reduced laryngeal elevation;Reduced epiglottic inversion;Reduced tongue base retraction Pharyngeal - Solids Pharyngeal - Puree: Reduced laryngeal elevation;Reduced epiglottic inversion;Pharyngeal residue - valleculae;Reduced tongue base retraction Pharyngeal - Mechanical Soft: Pharyngeal residue - valleculae;Reduced epiglottic inversion;Reduced laryngeal elevation;Reduced  tongue base retraction Pharyngeal Phase - Comment Pharyngeal Comment: head turn right/left, chin tuck not effective to decrease pharyngeal stasis, dry swallows and expectoration effective to remove barium residuals from pharynx that pt can not swallow to clear  Cervical Esophageal Phase    GO    Cervical Esophageal Phase Cervical Esophageal Phase: Impaired Cervical Esophageal Phase - Nectar Nectar Teaspoon: Reduced cricopharyngeal relaxation Nectar Cup: Reduced cricopharyngeal relaxation Cervical Esophageal Phase - Thin Thin Teaspoon: Reduced cricopharyngeal relaxation Thin Cup: Reduced cricopharyngeal relaxation Thin Straw: Reduced cricopharyngeal relaxation Cervical Esophageal Phase - Solids Puree: Reduced cricopharyngeal relaxation Mechanical Soft: Reduced cricopharyngeal relaxation Cervical Esophageal Phase - Comment Cervical Esophageal Comment: decreased clearance through proximal esophagus, ? secondary to edema, poor UES opening - proximal esophagus appeared "tight" significantly impairing barium flow - did not test barium tablet due to aspiration risk    Functional Assessment Tool Used: MBS Functional Limitations: Swallowing Swallow Current Status (Z1696): At least 60 percent but less than 80 percent impaired, limited or restricted Swallow Goal Status [phone]): At least 60 percent  but less than 80 percent impaired, limited or restricted Swallow Discharge Status [phone]): At least 60 percent but less than 80 percent impaired, limited or restricted    Luanna Salk, [facility] SLP [phone]

## 2013-08-15 DIAGNOSIS — M2550 Pain in unspecified joint: Secondary | ICD-10-CM | POA: Diagnosis not present

## 2013-08-15 DIAGNOSIS — R293 Abnormal posture: Secondary | ICD-10-CM | POA: Diagnosis not present

## 2013-08-15 DIAGNOSIS — M25519 Pain in unspecified shoulder: Secondary | ICD-10-CM | POA: Diagnosis not present

## 2013-08-15 DIAGNOSIS — M25619 Stiffness of unspecified shoulder, not elsewhere classified: Secondary | ICD-10-CM | POA: Diagnosis not present

## 2013-08-16 ENCOUNTER — Ambulatory Visit (INDEPENDENT_AMBULATORY_CARE_PROVIDER_SITE_OTHER): Payer: Medicare Other

## 2013-08-16 DIAGNOSIS — I4891 Unspecified atrial fibrillation: Secondary | ICD-10-CM | POA: Diagnosis not present

## 2013-08-16 DIAGNOSIS — Z7901 Long term (current) use of anticoagulants: Secondary | ICD-10-CM | POA: Diagnosis not present

## 2013-08-16 DIAGNOSIS — Z5181 Encounter for therapeutic drug level monitoring: Secondary | ICD-10-CM | POA: Diagnosis not present

## 2013-08-16 DIAGNOSIS — I824Y9 Acute embolism and thrombosis of unspecified deep veins of unspecified proximal lower extremity: Secondary | ICD-10-CM | POA: Diagnosis not present

## 2013-08-16 LAB — POCT INR: INR: 2

## 2013-08-22 ENCOUNTER — Ambulatory Visit: Payer: Medicare Other | Attending: Otolaryngology | Admitting: Rehabilitative and Restorative Service Providers"

## 2013-08-22 DIAGNOSIS — Z981 Arthrodesis status: Secondary | ICD-10-CM | POA: Diagnosis not present

## 2013-08-22 DIAGNOSIS — R269 Unspecified abnormalities of gait and mobility: Secondary | ICD-10-CM | POA: Diagnosis not present

## 2013-08-22 DIAGNOSIS — IMO0001 Reserved for inherently not codable concepts without codable children: Secondary | ICD-10-CM | POA: Insufficient documentation

## 2013-08-22 DIAGNOSIS — Z96619 Presence of unspecified artificial shoulder joint: Secondary | ICD-10-CM | POA: Diagnosis not present

## 2013-08-22 DIAGNOSIS — Z9884 Bariatric surgery status: Secondary | ICD-10-CM | POA: Insufficient documentation

## 2013-08-23 DIAGNOSIS — I10 Essential (primary) hypertension: Secondary | ICD-10-CM | POA: Diagnosis not present

## 2013-08-23 DIAGNOSIS — E1149 Type 2 diabetes mellitus with other diabetic neurological complication: Secondary | ICD-10-CM | POA: Diagnosis not present

## 2013-08-23 DIAGNOSIS — E1142 Type 2 diabetes mellitus with diabetic polyneuropathy: Secondary | ICD-10-CM | POA: Diagnosis not present

## 2013-08-23 DIAGNOSIS — Z9884 Bariatric surgery status: Secondary | ICD-10-CM | POA: Diagnosis not present

## 2013-08-23 DIAGNOSIS — Z92241 Personal history of systemic steroid therapy: Secondary | ICD-10-CM | POA: Diagnosis not present

## 2013-08-23 DIAGNOSIS — E785 Hyperlipidemia, unspecified: Secondary | ICD-10-CM | POA: Diagnosis not present

## 2013-08-23 DIAGNOSIS — Z79899 Other long term (current) drug therapy: Secondary | ICD-10-CM | POA: Diagnosis not present

## 2013-08-23 DIAGNOSIS — Z13820 Encounter for screening for osteoporosis: Secondary | ICD-10-CM | POA: Diagnosis not present

## 2013-08-29 ENCOUNTER — Ambulatory Visit: Payer: Medicare Other | Admitting: Physical Therapy

## 2013-08-29 DIAGNOSIS — Z981 Arthrodesis status: Secondary | ICD-10-CM | POA: Diagnosis not present

## 2013-08-29 DIAGNOSIS — IMO0001 Reserved for inherently not codable concepts without codable children: Secondary | ICD-10-CM | POA: Diagnosis not present

## 2013-08-29 DIAGNOSIS — Z96619 Presence of unspecified artificial shoulder joint: Secondary | ICD-10-CM | POA: Diagnosis not present

## 2013-08-29 DIAGNOSIS — Z9884 Bariatric surgery status: Secondary | ICD-10-CM | POA: Diagnosis not present

## 2013-08-29 DIAGNOSIS — R269 Unspecified abnormalities of gait and mobility: Secondary | ICD-10-CM | POA: Diagnosis not present

## 2013-08-30 ENCOUNTER — Encounter: Payer: Self-pay | Admitting: Neurology

## 2013-08-30 ENCOUNTER — Ambulatory Visit (INDEPENDENT_AMBULATORY_CARE_PROVIDER_SITE_OTHER): Payer: Medicare Other | Admitting: Neurology

## 2013-08-30 VITALS — BP 100/60 | HR 72 | Resp 16 | Ht 75.0 in | Wt 203.0 lb

## 2013-08-30 DIAGNOSIS — G252 Other specified forms of tremor: Principal | ICD-10-CM

## 2013-08-30 DIAGNOSIS — G250 Essential tremor: Secondary | ICD-10-CM | POA: Diagnosis not present

## 2013-08-30 DIAGNOSIS — R1314 Dysphagia, pharyngoesophageal phase: Secondary | ICD-10-CM | POA: Diagnosis not present

## 2013-08-30 DIAGNOSIS — G609 Hereditary and idiopathic neuropathy, unspecified: Secondary | ICD-10-CM | POA: Diagnosis not present

## 2013-08-30 MED ORDER — PRIMIDONE 50 MG PO TABS: 50.0000 mg | ORAL_TABLET | Freq: Every day | ORAL | Status: DC

## 2013-08-30 NOTE — Progress Notes (Signed)
Subjective:    [name] was seen in consultation in the movement disorder clinic at the request of Dr. Joya Salm.  His PCP is Eliezer Lofts, MD.  This patient is accompanied in the office by his child who supplements the history. The evaluation is for tremor.  I reviewed prior records that are available to me.  Looking back in his medical record, there are notes back to 2012 that indicate severe essential tremor.  Pt reports tremor has been worse since recent surgeries.  He had a shoulder surgery for bone spurs first and then had a shoulder replacement on the L and then had a cervical fusion surgery.  Prior to that, he had gastric bypass in 2013.    The patient is a 73 y.o. right handed male with a history of tremor.  Pt reports that tremor started after an episode of sepsis about 5 years ago.  Once he left the hospital, it was in the bilateral UE, overall slight but it has gotten worse over 8 months.  He cannot eat peas or the roll off the spoon.   There is no family hx of tremor.    Affected by caffeine:  No. Affected by alcohol: doesn't drink Affected by stress:  No. Affected by fatigue:  No. Spills soup if on spoon:  Yes.   Spills glass of liquid if full:  No. Affects ADL's (tying shoes, brushing teeth, etc):  No.  Pt had neck hematomas after surgery, which caused dysphagia.  A modified barium swallow was done on 08/14/2013 indicate severe cervical face dysphagia, moderate to severe pharyngeal phase dysphagia.  There is recommended if crush his medications or takes them in liquid form.  Outpatient speech language pathology and nutrition consult was also recommended.  Current/Previously tried tremor medications: no  Current medications that may exacerbate tremor:  n/a  Outside reports reviewed: historical medical records, lab reports and referral letter/letters.  Allergies  Allergen Reactions  . Sulfacetamide Sodium     REACTION: Throat swelling  . Adhesive [Tape]     Tears skin      Current Outpatient Prescriptions on File Prior to Visit  Medication Sig Dispense Refill  . atorvastatin (LIPITOR) 80 MG tablet Take 1 tablet (80 mg total) by mouth at bedtime.  90 tablet  3  . FLUoxetine (PROZAC) 40 MG capsule Take 1 capsule (40 mg total) by mouth daily.  30 capsule  2  . furosemide (LASIX) 20 MG tablet Take 1-2  tablets (20-40  mg total) by mouth daily.  135 tablet  3  . lisinopril (PRINIVIL,ZESTRIL) 2.5 MG tablet Take 2.5 mg by mouth daily.      [name] omeprazole (PRILOSEC) 40 MG capsule Take 40 mg by mouth 2 (two) times daily.      . silodosin (RAPAFLO) 8 MG CAPS capsule Take 1 capsule (8 mg total) by mouth daily with breakfast.  90 capsule  3  . triamcinolone cream (KENALOG) 0.1 % Apply 1 application topically 2 (two) times daily as needed (dry skin).       [name] warfarin (COUMADIN) 2.5 MG tablet Take 2.5 mg by mouth daily.      [name] zolpidem (AMBIEN) 5 MG tablet TAKE ONE OR TWO TABS AT BEDTIME AS NEEDED  30 tablet  0   No current facility-administered medications on file prior to visit.    Past Medical History  Diagnosis Date  . Diverticulosis of colon (without mention of hemorrhage)   . Other pulmonary embolism and infarction   .  Restless legs syndrome (RLS)   . Obesity, unspecified   . Gastroparesis   . Lumbago   . Anxiety state, unspecified   . Pure hypercholesterolemia     takes Atorvastatin daily  . Hemorrhoids   . Back pain, chronic   . Hx of gout     but doesn't take any meds  . Bruises easily     pt is on Coumadin  . Cancer     SKIN CANCER REMOVED  . Esophageal reflux     takes Omeprazole daily  . Insomnia     takes Ambien as nightly as needed  . Atrial fibrillation     takes Coumadin daily at bedtime  . Unspecified essential hypertension     takes Lisinopril daily  . Depressive disorder, not elsewhere classified     takes Prozac daily  . History of blood clots     behind right knee and then went into left lung 108yrs ago  . Peripheral edema      takes Furosemide daily  . Unspecified asthma(493.90)     as a child  . History of bronchitis     spring of 2014--Advair prn  . Pneumonia     last time about 75yrs ago  . Arthritis   . Joint swelling   . Joint pain   . Chronic back pain   . History of colon polyps   . History of kidney stones   . Hard of hearing     wears hearing aids  . Type II or unspecified type diabetes mellitus without mention of complication, not stated as uncontrolled     not taking any medications at this time  . Dysrhythmia     Atrial Fibrillation  . Peripheral vascular disease   . Pulmonary embolism     Past Surgical History  Procedure Laterality Date  . Knee surgery Right     x 4  . Artery repair      Left forearm  . Foot surgery      Left foot   . Replacement total knee Left     x 2  . Breath tek h pylori  01/08/2011    Procedure: BREATH TEK H PYLORI;  Surgeon: Pedro Earls, MD;  Location: [facility];  Service: General;  Laterality: N/A;  . Hand surgery      LEFT  . Leg surgery      FOR NECROTIZING FASCITIS L LEG AND GROIN  . 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 Shoulder arthroscopy    . Shoulder surgery Left 2014  . Back surgery      x 3  . Knee surgery Left     arthroscopy  . Eye surgery      cataract bil  . Injections in back      x 18  . Colonoscopy    . Cystoscopy    . Total shoulder arthroplasty Left 01/26/2013    Procedure: TOTAL SHOULDER ARTHROPLASTY;  Surgeon: Nita Sells, MD;  Location: [facility];  Service: Orthopedics;  Laterality: Left;  Left total shoulder arthroplasty  . Anterior cervical decomp/discectomy fusion N/A 05/09/2013    Procedure: ANTERIOR CERVICAL DECOMPRESSION/DISCECTOMY FUSION CERVICAL THREE-FOUR,CERVICAL FOUR-FIVE,CERVICAL FIVE-SIX;  Surgeon: Floyce Stakes, MD;  Location: [facility];   Service: Neurosurgery;  Laterality: N/A;    History   Social  History  . Marital Status: Married    Spouse Name: N/A    Number of Children: 1  . Years of Education: N/A   Occupational History  . Retired     Administrator   Social History Main Topics  . Smoking status: Never Smoker   . Smokeless tobacco: Never Used  . Alcohol Use: No  . Drug Use: No  . Sexual Activity: Yes   Other Topics Concern  . Not on file   Social History Narrative   DIET: 3 meals, F&V, some water, crystal light, No fast food   Exercise: walks treadmill 30 min      1 Caffeine drinks daily       No living will.           Family Status  Relation Status Death Age  . Mother Deceased     breast/uterine cancer  . Father Deceased     emphysema, heart disease  . Sister Deceased     alzheimer's disease  . Brother Deceased     struck by lightening  . Brother Deceased     colon cancer  . Brother Deceased     heart disease  . Brother Alive     prostate cancer  . Daughter Alive     healthy    Review of Systems A complete 10 system ROS was obtained and was negative apart from what is mentioned.   Objective:   VITALS:   Filed Vitals:   08/30/13 0824  BP: 100/60  Pulse: 72  Resp: 16  Height: 6\' 3"  (1.905 m)  Weight: 203 lb (92.08 kg)   Gen:  Appears stated age and in NAD. HEENT:  Normocephalic, atraumatic. The mucous membranes are moist. The superficial temporal arteries are without ropiness or tenderness. Cardiovascular: Regular rate and rhythm. Lungs: Clear to auscultation bilaterally. Neck: There are no carotid bruits noted bilaterally.  NEUROLOGICAL:  Orientation:  The patient is alert and oriented x 3.  Recent and remote memory are intact.  Attention span and concentration are normal.  Able to name objects and repeat without trouble.  Fund of knowledge is appropriate Cranial nerves: There is good facial symmetry. The pupils are equal round and reactive to light bilaterally.  Fundoscopic exam reveals clear disc margins bilaterally. Extraocular muscles are intact and visual fields are full to confrontational testing. Speech is fluent and clear. Soft palate rises symmetrically and there is no tongue deviation. Hearing is intact to conversational tone. Tone: Tone is good throughout.  Mild gegenhalten. Sensation: Sensation is intact to light touch and pinprick throughout (facial, trunk, extremities). Vibration is decreased at the bilateral big toe and ankle. There is no extinction with double simultaneous stimulation. There is no sensory dermatomal level identified. Coordination:  The patient has no dysdiadichokinesia or dysmetria. Motor: Strength is 5/5 in the bilateral upper and lower extremities.  Shoulder shrug is equal bilaterally.  There is no pronator drift.  There are no fasciculations noted. DTR's: Deep tendon reflexes are 1/4 at the bilateral biceps, triceps, brachioradialis, patella and absent at the bilateral achilles.  Plantar responses are downgoing bilaterally. Gait and Station: The patient is able to ambulate without difficulty. The patient is able to heel toe walk without any difficulty. The patient is able to ambulate in a tandem fashion. The patient is able to stand in the Romberg position.   MOVEMENT EXAM: Tremor:  There is tremor in the UE, noted most significantly with action.  The patient is mild trouble with  archimedes spirals.   There is some tremor at rest bilaterally, R more than L.  He spills some water when pouring it from one glass to another.     Assessment/Plan:   1.  Essential Tremor.  -This is evidenced by the symmetrical nature and longstanding hx of gradually getting worse.  He does have a resting component now but that is not atypical as this progresses.  I see no neurodegenerative features.  Discussed treatment options with the patient today.  He ultimately decided to try primidone.  Long discussion with him about the fact that the  primidone can interact with Coumadin.  He is to have his INR is monitored more frequently.  He and his daughter understood this.  I'm just going to start with a low dose of primidone, so I doubt it will interfere significantly with the INR.  Likely will need more primidone to be efficacious for tremor.  Risks, benefits, side effects and alternative therapies were discussed.  The opportunity to ask questions was given and they were answered to the best of my ability.  The patient expressed understanding and willingness to follow the outlined treatment protocols.  -I. did talk to him about the fact that DBS is likely the only thing that would be successful to get rid of the tremor, but I also talked to him about the fact that I did not think that he would be a DBS candidate given his multiple other medical problems.  -Talked to the patient about resources to help him at home, including weighted gloves, weighted spoon.  Talked about Lift ware.  Patient education provided.  Gave them information on [employer] essential tremor [employer].  Greater than 50% of the 60 min visit spent in counseling. 2.  Gait instability  -It is believed that ET alone can cause ataxia/balance changes due to changes in the cerebellar purkinje cell/climbing fiber synaptic transmission.   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 3.  Dyphagia  -Needs SLP and talked to him about following up in that regard per recommendations about MBE. 4.  Return in about 8 weeks (around 10/25/2013).

## 2013-08-30 NOTE — Patient Instructions (Signed)
1. Start Primidone 50 mg tablets. Take 1/2 tablet at night for 3 nights then increase to 1 tablet at night.  2. Have INR monitored. Primidone may affect your levels.  3. Follow up in 8 weeks.

## 2013-08-31 ENCOUNTER — Ambulatory Visit: Payer: Medicare Other | Admitting: Speech Pathology

## 2013-08-31 ENCOUNTER — Ambulatory Visit: Payer: Medicare Other | Admitting: Physical Therapy

## 2013-08-31 ENCOUNTER — Ambulatory Visit: Payer: Medicare Other | Attending: Family Medicine | Admitting: Speech Pathology

## 2013-08-31 DIAGNOSIS — R269 Unspecified abnormalities of gait and mobility: Secondary | ICD-10-CM | POA: Diagnosis not present

## 2013-08-31 DIAGNOSIS — IMO0001 Reserved for inherently not codable concepts without codable children: Secondary | ICD-10-CM | POA: Insufficient documentation

## 2013-08-31 DIAGNOSIS — R1310 Dysphagia, unspecified: Secondary | ICD-10-CM | POA: Insufficient documentation

## 2013-08-31 DIAGNOSIS — Z9884 Bariatric surgery status: Secondary | ICD-10-CM | POA: Diagnosis not present

## 2013-08-31 DIAGNOSIS — Z96619 Presence of unspecified artificial shoulder joint: Secondary | ICD-10-CM | POA: Diagnosis not present

## 2013-08-31 DIAGNOSIS — Z981 Arthrodesis status: Secondary | ICD-10-CM | POA: Diagnosis not present

## 2013-09-05 ENCOUNTER — Ambulatory Visit: Payer: Medicare Other | Admitting: Physical Therapy

## 2013-09-05 DIAGNOSIS — Z96619 Presence of unspecified artificial shoulder joint: Secondary | ICD-10-CM | POA: Diagnosis not present

## 2013-09-05 DIAGNOSIS — R269 Unspecified abnormalities of gait and mobility: Secondary | ICD-10-CM | POA: Diagnosis not present

## 2013-09-05 DIAGNOSIS — Z981 Arthrodesis status: Secondary | ICD-10-CM | POA: Diagnosis not present

## 2013-09-05 DIAGNOSIS — IMO0001 Reserved for inherently not codable concepts without codable children: Secondary | ICD-10-CM | POA: Diagnosis not present

## 2013-09-05 DIAGNOSIS — Z9884 Bariatric surgery status: Secondary | ICD-10-CM | POA: Diagnosis not present

## 2013-09-07 ENCOUNTER — Ambulatory Visit: Payer: Medicare Other | Admitting: Physical Therapy

## 2013-09-07 ENCOUNTER — Other Ambulatory Visit: Payer: Self-pay | Admitting: Cardiology

## 2013-09-07 ENCOUNTER — Other Ambulatory Visit: Payer: Self-pay | Admitting: Family Medicine

## 2013-09-07 DIAGNOSIS — R269 Unspecified abnormalities of gait and mobility: Secondary | ICD-10-CM | POA: Diagnosis not present

## 2013-09-07 DIAGNOSIS — IMO0001 Reserved for inherently not codable concepts without codable children: Secondary | ICD-10-CM | POA: Diagnosis not present

## 2013-09-07 DIAGNOSIS — Z96619 Presence of unspecified artificial shoulder joint: Secondary | ICD-10-CM | POA: Diagnosis not present

## 2013-09-07 DIAGNOSIS — Z9884 Bariatric surgery status: Secondary | ICD-10-CM | POA: Diagnosis not present

## 2013-09-07 DIAGNOSIS — Z981 Arthrodesis status: Secondary | ICD-10-CM | POA: Diagnosis not present

## 2013-09-07 NOTE — Telephone Encounter (Signed)
Received refill request electronically. Last refill 08/02/13 #30, last office visit 07/13/13. Is it okay to refill medication?

## 2013-09-08 NOTE — Telephone Encounter (Signed)
Ok to refill 30, 0 ref 

## 2013-09-08 NOTE — Telephone Encounter (Signed)
Called to [facility]. 

## 2013-09-12 ENCOUNTER — Ambulatory Visit: Payer: Medicare Other

## 2013-09-12 ENCOUNTER — Ambulatory Visit: Payer: Medicare Other | Attending: Family Medicine | Admitting: Rehabilitative and Restorative Service Providers"

## 2013-09-12 DIAGNOSIS — Z5189 Encounter for other specified aftercare: Secondary | ICD-10-CM | POA: Diagnosis not present

## 2013-09-12 DIAGNOSIS — R1310 Dysphagia, unspecified: Secondary | ICD-10-CM | POA: Insufficient documentation

## 2013-09-12 DIAGNOSIS — R269 Unspecified abnormalities of gait and mobility: Secondary | ICD-10-CM | POA: Diagnosis not present

## 2013-09-13 ENCOUNTER — Ambulatory Visit (INDEPENDENT_AMBULATORY_CARE_PROVIDER_SITE_OTHER): Payer: Medicare Other

## 2013-09-13 DIAGNOSIS — Z5181 Encounter for therapeutic drug level monitoring: Secondary | ICD-10-CM | POA: Diagnosis not present

## 2013-09-13 DIAGNOSIS — I824Y9 Acute embolism and thrombosis of unspecified deep veins of unspecified proximal lower extremity: Secondary | ICD-10-CM

## 2013-09-13 DIAGNOSIS — I4891 Unspecified atrial fibrillation: Secondary | ICD-10-CM

## 2013-09-13 DIAGNOSIS — Z7901 Long term (current) use of anticoagulants: Secondary | ICD-10-CM | POA: Diagnosis not present

## 2013-09-13 LAB — POCT INR: INR: 2.8

## 2013-09-14 ENCOUNTER — Ambulatory Visit: Payer: Medicare Other | Admitting: Physical Therapy

## 2013-09-15 ENCOUNTER — Ambulatory Visit: Payer: Medicare Other

## 2013-09-15 ENCOUNTER — Ambulatory Visit: Payer: Medicare Other | Attending: Otolaryngology | Admitting: Rehabilitative and Restorative Service Providers"

## 2013-09-15 DIAGNOSIS — Z9884 Bariatric surgery status: Secondary | ICD-10-CM | POA: Insufficient documentation

## 2013-09-15 DIAGNOSIS — Z96619 Presence of unspecified artificial shoulder joint: Secondary | ICD-10-CM | POA: Insufficient documentation

## 2013-09-15 DIAGNOSIS — R269 Unspecified abnormalities of gait and mobility: Secondary | ICD-10-CM | POA: Insufficient documentation

## 2013-09-15 DIAGNOSIS — IMO0001 Reserved for inherently not codable concepts without codable children: Secondary | ICD-10-CM | POA: Insufficient documentation

## 2013-09-15 DIAGNOSIS — Z981 Arthrodesis status: Secondary | ICD-10-CM | POA: Insufficient documentation

## 2013-09-19 ENCOUNTER — Ambulatory Visit: Payer: Medicare Other

## 2013-09-19 ENCOUNTER — Ambulatory Visit: Payer: Medicare Other | Admitting: Rehabilitative and Restorative Service Providers"

## 2013-09-19 DIAGNOSIS — R269 Unspecified abnormalities of gait and mobility: Secondary | ICD-10-CM | POA: Diagnosis not present

## 2013-09-19 DIAGNOSIS — Z5189 Encounter for other specified aftercare: Secondary | ICD-10-CM | POA: Diagnosis not present

## 2013-09-19 DIAGNOSIS — R1310 Dysphagia, unspecified: Secondary | ICD-10-CM | POA: Diagnosis not present

## 2013-09-20 DIAGNOSIS — N401 Enlarged prostate with lower urinary tract symptoms: Secondary | ICD-10-CM | POA: Diagnosis not present

## 2013-09-20 DIAGNOSIS — N139 Obstructive and reflux uropathy, unspecified: Secondary | ICD-10-CM | POA: Diagnosis not present

## 2013-09-21 ENCOUNTER — Ambulatory Visit: Payer: Medicare Other | Admitting: Physical Therapy

## 2013-09-21 DIAGNOSIS — Z5189 Encounter for other specified aftercare: Secondary | ICD-10-CM | POA: Diagnosis not present

## 2013-09-21 DIAGNOSIS — R269 Unspecified abnormalities of gait and mobility: Secondary | ICD-10-CM | POA: Diagnosis not present

## 2013-09-21 DIAGNOSIS — R1310 Dysphagia, unspecified: Secondary | ICD-10-CM | POA: Diagnosis not present

## 2013-09-22 ENCOUNTER — Ambulatory Visit: Payer: Medicare Other | Admitting: Physical Therapy

## 2013-09-22 ENCOUNTER — Ambulatory Visit: Payer: Medicare Other

## 2013-09-22 DIAGNOSIS — R1310 Dysphagia, unspecified: Secondary | ICD-10-CM | POA: Diagnosis not present

## 2013-09-22 DIAGNOSIS — R269 Unspecified abnormalities of gait and mobility: Secondary | ICD-10-CM | POA: Diagnosis not present

## 2013-09-22 DIAGNOSIS — Z5189 Encounter for other specified aftercare: Secondary | ICD-10-CM | POA: Diagnosis not present

## 2013-09-26 ENCOUNTER — Ambulatory Visit: Payer: Medicare Other

## 2013-09-27 DIAGNOSIS — N529 Male erectile dysfunction, unspecified: Secondary | ICD-10-CM | POA: Diagnosis not present

## 2013-09-27 DIAGNOSIS — N401 Enlarged prostate with lower urinary tract symptoms: Secondary | ICD-10-CM | POA: Diagnosis not present

## 2013-09-27 DIAGNOSIS — N281 Cyst of kidney, acquired: Secondary | ICD-10-CM | POA: Diagnosis not present

## 2013-09-28 ENCOUNTER — Encounter: Payer: Medicare Other | Admitting: Speech Pathology

## 2013-10-03 ENCOUNTER — Ambulatory Visit: Payer: Medicare Other | Admitting: Speech Pathology

## 2013-10-05 ENCOUNTER — Encounter: Payer: Medicare Other | Admitting: Speech Pathology

## 2013-10-06 DIAGNOSIS — M25549 Pain in joints of unspecified hand: Secondary | ICD-10-CM | POA: Diagnosis not present

## 2013-10-09 DIAGNOSIS — M79609 Pain in unspecified limb: Secondary | ICD-10-CM | POA: Diagnosis not present

## 2013-10-11 ENCOUNTER — Ambulatory Visit (INDEPENDENT_AMBULATORY_CARE_PROVIDER_SITE_OTHER): Payer: Medicare Other

## 2013-10-11 DIAGNOSIS — I824Y9 Acute embolism and thrombosis of unspecified deep veins of unspecified proximal lower extremity: Secondary | ICD-10-CM

## 2013-10-11 DIAGNOSIS — Z5181 Encounter for therapeutic drug level monitoring: Secondary | ICD-10-CM

## 2013-10-11 DIAGNOSIS — Z7901 Long term (current) use of anticoagulants: Secondary | ICD-10-CM | POA: Diagnosis not present

## 2013-10-11 DIAGNOSIS — I4891 Unspecified atrial fibrillation: Secondary | ICD-10-CM | POA: Diagnosis not present

## 2013-10-11 LAB — POCT INR: INR: 2.5

## 2013-10-12 ENCOUNTER — Other Ambulatory Visit: Payer: Self-pay | Admitting: Family Medicine

## 2013-10-12 NOTE — Telephone Encounter (Signed)
Last office visit 07/13/2013.  Last refilled 09/08/2013 for #30 with no refills.  Ok to refill?

## 2013-10-13 NOTE — Telephone Encounter (Signed)
Called to [facility]. 

## 2013-10-18 ENCOUNTER — Telehealth: Payer: Self-pay

## 2013-10-18 DIAGNOSIS — G5600 Carpal tunnel syndrome, unspecified upper limb: Secondary | ICD-10-CM | POA: Diagnosis not present

## 2013-10-18 NOTE — Telephone Encounter (Signed)
Pt left v/m; cost of ambien 5 mg tab has gone from $6.00 for 30 day supply to $50.00 for 30 day supply. Pt request new rx for ambien 10 mg which will cost pt $50.00 as well. Pt request cb.[facility].

## 2013-10-19 MED ORDER — ZOLPIDEM TARTRATE 10 MG PO TABS: 5.0000 mg | ORAL_TABLET | Freq: Every day | ORAL | Status: DC

## 2013-10-19 NOTE — Telephone Encounter (Signed)
Spoke with [facility]ist at [facility].  [name] picked up Ambien 5 mg on 10/16/2013.  I went ahead and called in new prescription for Ambien 10 mg for the pharmacy to hold until next month.  [name] advised to use the Ambien 5 mg this month that he has already picked up but next month when he needs a refill, he would  just need to contact [facility] because I have already called in Ambien 10 mg tablets  for next month.

## 2013-10-19 NOTE — Telephone Encounter (Signed)
Please cancel prescription for 5 mg daily. Changed rx and sent in for 10 mg 1/2 tab po daily.

## 2013-10-20 DIAGNOSIS — G5600 Carpal tunnel syndrome, unspecified upper limb: Secondary | ICD-10-CM | POA: Diagnosis not present

## 2013-10-24 DIAGNOSIS — G5600 Carpal tunnel syndrome, unspecified upper limb: Secondary | ICD-10-CM | POA: Diagnosis not present

## 2013-10-25 ENCOUNTER — Ambulatory Visit: Payer: Medicare Other | Admitting: Neurology

## 2013-10-26 DIAGNOSIS — Z6826 Body mass index (BMI) 26.0-26.9, adult: Secondary | ICD-10-CM | POA: Diagnosis not present

## 2013-10-26 DIAGNOSIS — M4802 Spinal stenosis, cervical region: Secondary | ICD-10-CM | POA: Diagnosis not present

## 2013-11-01 ENCOUNTER — Ambulatory Visit (INDEPENDENT_AMBULATORY_CARE_PROVIDER_SITE_OTHER): Payer: Medicare Other | Admitting: Neurology

## 2013-11-01 ENCOUNTER — Encounter: Payer: Self-pay | Admitting: Neurology

## 2013-11-01 VITALS — BP 128/62 | HR 76 | Ht 74.0 in | Wt 209.0 lb

## 2013-11-01 DIAGNOSIS — G252 Other specified forms of tremor: Secondary | ICD-10-CM | POA: Diagnosis not present

## 2013-11-01 DIAGNOSIS — G250 Essential tremor: Secondary | ICD-10-CM | POA: Diagnosis not present

## 2013-11-01 MED ORDER — PRIMIDONE 50 MG PO TABS: 50.0000 mg | ORAL_TABLET | Freq: Two times a day (BID) | ORAL | Status: DC

## 2013-11-01 NOTE — Progress Notes (Signed)
Subjective:    [name] was seen in consultation in the movement disorder clinic at the request of Dr. Joya Salm.  His PCP is Eliezer Lofts, MD.  This patient is accompanied in the office by his child who supplements the history. The evaluation is for tremor.  I reviewed prior records that are available to me.  Looking back in his medical record, there are notes back to 2012 that indicate severe essential tremor.  Pt reports tremor has been worse since recent surgeries.  He had a shoulder surgery for bone spurs first and then had a shoulder replacement on the L and then had a cervical fusion surgery.  Prior to that, he had gastric bypass in 2013.    The patient is a 73 y.o. right handed male with a history of tremor.  Pt reports that tremor started after an episode of sepsis about 5 years ago.  Once he left the hospital, it was in the bilateral UE, overall slight but it has gotten worse over 8 months.  He cannot eat peas or the roll off the spoon.   There is no family hx of tremor.    Affected by caffeine:  No. Affected by alcohol: doesn't drink Affected by stress:  No. Affected by fatigue:  No. Spills soup if on spoon:  Yes.   Spills glass of liquid if full:  No. Affects ADL's (tying shoes, brushing teeth, etc):  No.  Pt had neck hematomas after surgery, which caused dysphagia.  A modified barium swallow was done on 08/14/2013 indicate severe cervical face dysphagia, moderate to severe pharyngeal phase dysphagia.  There is recommended if crush his medications or takes them in liquid form.  Outpatient speech language pathology and nutrition consult was also recommended.  11/01/13 update:  Pt is returning for f/u.  Started primidone last visit.   Pt states that it has been helpful but thinks that we could go up on the medication.  No SE.  Had carpal tunnel release on the L.  States that it didn't help.  Has also gone to speech therapy for swallowing, as recommended and that did not  help.  Current/Previously tried tremor medications: no  Current medications that may exacerbate tremor:  n/a  Outside reports reviewed: historical medical records, lab reports and referral letter/letters.  Allergies  Allergen Reactions  . Sulfacetamide Sodium     REACTION: Throat swelling  . Adhesive [Tape]     Tears skin    Current Outpatient Prescriptions on File Prior to Visit  Medication Sig Dispense Refill  . atorvastatin (LIPITOR) 80 MG tablet Take 1 tablet (80 mg total) by mouth at bedtime.  90 tablet  3  . FLUoxetine (PROZAC) 40 MG capsule Take 1 capsule (40 mg total) by mouth daily.  30 capsule  2  . furosemide (LASIX) 20 MG tablet Take 1-2  tablets (20-40  mg total) by mouth daily.  135 tablet  3  . lisinopril (PRINIVIL,ZESTRIL) 2.5 MG tablet Take 2.5 mg by mouth daily.      [name] omeprazole (PRILOSEC) 40 MG capsule Take 40 mg by mouth 2 (two) times daily.      . silodosin (RAPAFLO) 8 MG CAPS capsule Take 1 capsule (8 mg total) by mouth daily with breakfast.  90 capsule  3  . triamcinolone cream (KENALOG) 0.1 % Apply 1 application topically 2 (two) times daily as needed (dry skin).       [name] warfarin (COUMADIN) 2.5 MG tablet Take 2.5 mg by mouth  daily.      . zolpidem (AMBIEN) 10 MG tablet Take 0.5-1 tablets (5-10 mg total) by mouth at bedtime.  30 tablet  0   No current facility-administered medications on file prior to visit.    Past Medical History  Diagnosis Date  . Diverticulosis of colon (without mention of hemorrhage)   . Other pulmonary embolism and infarction   . Restless legs syndrome (RLS)   . Obesity, unspecified   . Gastroparesis   . Lumbago   . Anxiety state, unspecified   . Pure hypercholesterolemia     takes Atorvastatin daily  . Hemorrhoids   . Back pain, chronic   . Hx of gout     but doesn't take any meds  . Bruises easily     pt is on Coumadin  . Cancer     SKIN CANCER REMOVED  . Esophageal reflux     takes Omeprazole daily  . Insomnia      takes Ambien as nightly as needed  . Atrial fibrillation     takes Coumadin daily at bedtime  . Unspecified essential hypertension     takes Lisinopril daily  . Depressive disorder, not elsewhere classified     takes Prozac daily  . History of blood clots     behind right knee and then went into left lung 81yrs ago  . Peripheral edema     takes Furosemide daily  . Unspecified asthma(493.90)     as a child  . History of bronchitis     spring of 2014--Advair prn  . Pneumonia     last time about 48yrs ago  . Arthritis   . Joint swelling   . Joint pain   . Chronic back pain   . History of colon polyps   . History of kidney stones   . Hard of hearing     wears hearing aids  . Type II or unspecified type diabetes mellitus without mention of complication, not stated as uncontrolled     not taking any medications at this time  . Dysrhythmia     Atrial Fibrillation  . Peripheral vascular disease   . Pulmonary embolism     Past Surgical History  Procedure Laterality Date  . Knee surgery Right     x 4  . Artery repair      Left forearm  . Foot surgery      Left foot   . Replacement total knee Left     x 2  . Breath tek h pylori  01/08/2011    Procedure: BREATH TEK H PYLORI;  Surgeon: Pedro Earls, MD;  Location: [facility];  Service: General;  Laterality: N/A;  . Hand surgery      LEFT  . Leg surgery      FOR NECROTIZING FASCITIS L LEG AND GROIN  . 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 Shoulder arthroscopy    . Shoulder surgery Left 2014  . Back surgery      x 3  . Knee surgery Left     arthroscopy  . Eye surgery      cataract bil  . Injections in back      x 18  . Colonoscopy    . Cystoscopy    . Total shoulder arthroplasty Left 01/26/2013    Procedure: TOTAL SHOULDER  ARTHROPLASTY;  Surgeon: Nita Sells, MD;  Location: [place];  Service: Orthopedics;  Laterality: Left;  Left total shoulder arthroplasty  . Anterior cervical decomp/discectomy fusion N/A 05/09/2013    Procedure: ANTERIOR CERVICAL DECOMPRESSION/DISCECTOMY FUSION CERVICAL THREE-FOUR,CERVICAL FOUR-FIVE,CERVICAL FIVE-SIX;  Surgeon: Floyce Stakes, MD;  Location: [facility];  Service: Neurosurgery;  Laterality: N/A;    History   Social History  . Marital Status: Married    Spouse Name: N/A    Number of Children: 1  . Years of Education: N/A   Occupational History  . Retired     Administrator   Social History Main Topics  . Smoking status: Never Smoker   . Smokeless tobacco: Never Used  . Alcohol Use: No  . Drug Use: No  . Sexual Activity: Yes   Other Topics Concern  . Not on file   Social History Narrative   DIET: 3 meals, F&V, some water, crystal light, No fast food   Exercise: walks treadmill 30 min      1 Caffeine drinks daily       No living will.           Family Status  Relation Status Death Age  . Mother Deceased     breast/uterine cancer  . Father Deceased     emphysema, heart disease  . Sister Deceased     alzheimer's disease  . Brother Deceased     struck by lightening  . Brother Deceased     colon cancer  . Brother Deceased     heart disease  . Brother Alive     prostate cancer  . Daughter Alive     healthy    Review of Systems A complete 10 system ROS was obtained and was negative apart from what is mentioned.   Objective:   VITALS:   Filed Vitals:   11/01/13 0949  BP: 128/62  Pulse: 76  Height: 6\' 2"  (1.88 m)  Weight: 209 lb (94.802 kg)   Gen:  Appears stated age and in NAD. HEENT:  Normocephalic, atraumatic. The mucous membranes are moist. The superficial temporal arteries are without ropiness or tenderness.   NEUROLOGICAL:  Orientation:  The patient is alert and oriented x 3.  Recent and remote memory are intact.  Attention span and concentration  are normal.  Able to name objects and repeat without trouble.  Fund of knowledge is appropriate Cranial nerves: There is good facial symmetry. Extraocular muscles are intact and visual fields are full to confrontational testing. Speech is fluent and clear. Soft palate rises symmetrically and there is no tongue deviation. Hearing is intact to conversational tone. Tone: Tone is good throughout.  Mild gegenhalten. Sensation: Sensation is intact to light touch throughout. Coordination:  The patient has no dysdiadichokinesia or dysmetria. Motor: Strength is 5/5 in the bilateral upper and lower extremities.  Shoulder shrug is equal bilaterally.  There is no pronator drift.  There are no fasciculations noted. Gait and Station: The patient is mildly unstable  MOVEMENT EXAM: Tremor:  There is almost no tremor in the UE today.  The patient is mild trouble with archimedes spirals.   Rest tremor not seen today either.  Doesn't spill water when pouring from one glass to another like he did last visit.     Assessment/Plan:   1.  Essential Tremor.  -Markedly better on low-dose primidone.  The patient would like to try to go up to twice a day dosing.  I don't have  any objection.  Risks, benefits, side effects and alternative therapies were discussed.  The opportunity to ask questions was given and they were answered to the best of my ability.  The patient expressed understanding and willingness to follow the outlined treatment protocols. 2.  Gait instability  -It is believed that ET alone can cause ataxia/balance changes due to changes in the cerebellar purkinje cell/climbing fiber synaptic transmission.   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 3.  Dyphagia  -due to prior complications from neck surgery 4.  Return in about 6 months (around 05/02/2014).

## 2013-11-08 ENCOUNTER — Ambulatory Visit (INDEPENDENT_AMBULATORY_CARE_PROVIDER_SITE_OTHER): Payer: Medicare Other

## 2013-11-08 DIAGNOSIS — I824Y9 Acute embolism and thrombosis of unspecified deep veins of unspecified proximal lower extremity: Secondary | ICD-10-CM | POA: Diagnosis not present

## 2013-11-08 DIAGNOSIS — I4891 Unspecified atrial fibrillation: Secondary | ICD-10-CM | POA: Diagnosis not present

## 2013-11-08 DIAGNOSIS — Z5181 Encounter for therapeutic drug level monitoring: Secondary | ICD-10-CM | POA: Diagnosis not present

## 2013-11-08 DIAGNOSIS — Z7901 Long term (current) use of anticoagulants: Secondary | ICD-10-CM | POA: Diagnosis not present

## 2013-11-08 LAB — POCT INR: INR: 3

## 2013-11-24 ENCOUNTER — Other Ambulatory Visit: Payer: Self-pay

## 2013-11-29 ENCOUNTER — Telehealth: Payer: Self-pay | Admitting: Neurology

## 2013-11-29 NOTE — Telephone Encounter (Signed)
Dr. Harley Hallmark office called for pt notes, they referred pt to [place]. Office notes for July and September appts faxed to attn: Becky, [phone] / Sherri S.

## 2013-12-06 ENCOUNTER — Ambulatory Visit (INDEPENDENT_AMBULATORY_CARE_PROVIDER_SITE_OTHER): Payer: Medicare Other

## 2013-12-06 DIAGNOSIS — Z5181 Encounter for therapeutic drug level monitoring: Secondary | ICD-10-CM

## 2013-12-06 DIAGNOSIS — Z7901 Long term (current) use of anticoagulants: Secondary | ICD-10-CM

## 2013-12-06 DIAGNOSIS — I4891 Unspecified atrial fibrillation: Secondary | ICD-10-CM

## 2013-12-06 DIAGNOSIS — I824Y9 Acute embolism and thrombosis of unspecified deep veins of unspecified proximal lower extremity: Secondary | ICD-10-CM | POA: Diagnosis not present

## 2013-12-06 LAB — POCT INR: INR: 3.1

## 2013-12-07 DIAGNOSIS — R2689 Other abnormalities of gait and mobility: Secondary | ICD-10-CM | POA: Diagnosis not present

## 2013-12-07 DIAGNOSIS — H6901 Patulous Eustachian tube, right ear: Secondary | ICD-10-CM | POA: Diagnosis not present

## 2013-12-07 DIAGNOSIS — R1313 Dysphagia, pharyngeal phase: Secondary | ICD-10-CM | POA: Diagnosis not present

## 2013-12-08 DIAGNOSIS — Z23 Encounter for immunization: Secondary | ICD-10-CM | POA: Diagnosis not present

## 2013-12-26 DIAGNOSIS — Z961 Presence of intraocular lens: Secondary | ICD-10-CM | POA: Diagnosis not present

## 2013-12-26 DIAGNOSIS — H26493 Other secondary cataract, bilateral: Secondary | ICD-10-CM | POA: Diagnosis not present

## 2013-12-26 DIAGNOSIS — E119 Type 2 diabetes mellitus without complications: Secondary | ICD-10-CM | POA: Diagnosis not present

## 2013-12-28 ENCOUNTER — Other Ambulatory Visit: Payer: Self-pay | Admitting: Dermatology

## 2013-12-28 ENCOUNTER — Other Ambulatory Visit: Payer: Self-pay | Admitting: Family Medicine

## 2013-12-28 DIAGNOSIS — D0439 Carcinoma in situ of skin of other parts of face: Secondary | ICD-10-CM | POA: Diagnosis not present

## 2013-12-28 DIAGNOSIS — L570 Actinic keratosis: Secondary | ICD-10-CM | POA: Diagnosis not present

## 2013-12-28 DIAGNOSIS — Z23 Encounter for immunization: Secondary | ICD-10-CM | POA: Diagnosis not present

## 2013-12-28 DIAGNOSIS — Z85828 Personal history of other malignant neoplasm of skin: Secondary | ICD-10-CM | POA: Diagnosis not present

## 2013-12-28 DIAGNOSIS — L821 Other seborrheic keratosis: Secondary | ICD-10-CM | POA: Diagnosis not present

## 2013-12-28 DIAGNOSIS — L218 Other seborrheic dermatitis: Secondary | ICD-10-CM | POA: Diagnosis not present

## 2013-12-28 DIAGNOSIS — D485 Neoplasm of uncertain behavior of skin: Secondary | ICD-10-CM | POA: Diagnosis not present

## 2014-01-02 ENCOUNTER — Ambulatory Visit (INDEPENDENT_AMBULATORY_CARE_PROVIDER_SITE_OTHER): Payer: Medicare Other | Admitting: Neurology

## 2014-01-02 ENCOUNTER — Encounter: Payer: Self-pay | Admitting: Neurology

## 2014-01-02 ENCOUNTER — Other Ambulatory Visit: Payer: Self-pay | Admitting: Family Medicine

## 2014-01-02 VITALS — BP 106/60 | HR 68 | Ht 74.0 in | Wt 208.0 lb

## 2014-01-02 DIAGNOSIS — G250 Essential tremor: Secondary | ICD-10-CM

## 2014-01-02 DIAGNOSIS — E1342 Other specified diabetes mellitus with diabetic polyneuropathy: Secondary | ICD-10-CM | POA: Diagnosis not present

## 2014-01-02 DIAGNOSIS — G629 Polyneuropathy, unspecified: Secondary | ICD-10-CM | POA: Diagnosis not present

## 2014-01-02 DIAGNOSIS — G252 Other specified forms of tremor: Principal | ICD-10-CM

## 2014-01-02 DIAGNOSIS — H02409 Unspecified ptosis of unspecified eyelid: Secondary | ICD-10-CM

## 2014-01-02 DIAGNOSIS — L908 Other atrophic disorders of skin: Secondary | ICD-10-CM

## 2014-01-02 DIAGNOSIS — E1142 Type 2 diabetes mellitus with diabetic polyneuropathy: Secondary | ICD-10-CM

## 2014-01-02 DIAGNOSIS — R251 Tremor, unspecified: Secondary | ICD-10-CM

## 2014-01-02 MED ORDER — PRIMIDONE 50 MG PO TABS: ORAL_TABLET | ORAL | Status: DC

## 2014-01-02 NOTE — Telephone Encounter (Signed)
Ok, #30, 0 ref 

## 2014-01-02 NOTE — Telephone Encounter (Signed)
Called to [facility]. 

## 2014-01-02 NOTE — Progress Notes (Signed)
Subjective:    [name] was seen in consultation in the movement disorder clinic at the request of Dr. Joya Salm.  His PCP is Eliezer Lofts, MD.  This patient is accompanied in the office by his child who supplements the history. The evaluation is for tremor.  I reviewed prior records that are available to me.  Looking back in his medical record, there are notes back to 2012 that indicate severe essential tremor.  Pt reports tremor has been worse since recent surgeries.  He had a shoulder surgery for bone spurs first and then had a shoulder replacement on the L and then had a cervical fusion surgery.  Prior to that, he had gastric bypass in 2013.    The patient is a 73 y.o. right handed male with a history of tremor.  Pt reports that tremor started after an episode of sepsis about 5 years ago.  Once he left the hospital, it was in the bilateral UE, overall slight but it has gotten worse over 8 months.  He cannot eat peas or the roll off the spoon.   There is no family hx of tremor.    Affected by caffeine:  No. Affected by alcohol: doesn't drink Affected by stress:  No. Affected by fatigue:  No. Spills soup if on spoon:  Yes.   Spills glass of liquid if full:  No. Affects ADL's (tying shoes, brushing teeth, etc):  No.  Pt had neck hematomas after surgery, which caused dysphagia.  A modified barium swallow was done on 08/14/2013 indicate severe cervical face dysphagia, moderate to severe pharyngeal phase dysphagia.  There is recommended if crush his medications or takes them in liquid form.  Outpatient speech language pathology and nutrition consult was also recommended.  11/01/13 update:  Pt is returning for f/u.  Started primidone last visit.   Pt states that it has been helpful but thinks that we could go up on the medication.  No SE.  Had carpal tunnel release on the L.  States that it didn't help.  Has also gone to speech therapy for swallowing, as recommended and that did not  help.  01/02/14 update:  The patient returns for follow-up today.  He has a history of essential tremor.  Last visit, his primidone was increased 50 mg twice a day.  "I think that if you increased it one more time, we will have it just right."  I got a note from his ENT physician at [facility].  They agreed that dysphagia was related to prior cervical spine surgery.  They told him that loss of balance is not due to an inner ear pathology and he was told to follow-up here.  He does have a history of diabetic peripheral neuropathy.  Pt states that balance has been off since the neck surgery and thinks that it is related.  He states that it actually has gotten slowly better.  Balance isn't great but he isn't falling anymore either which used to be a big problem.  Current/Previously tried tremor medications: no  Current medications that may exacerbate tremor:  n/a  Outside reports reviewed: historical medical records, lab reports and referral letter/letters.  Allergies  Allergen Reactions  . Sulfacetamide Sodium     REACTION: Throat swelling  . Adhesive [Tape]     Tears skin    Current Outpatient Prescriptions on File Prior to Visit  Medication Sig Dispense Refill  . atorvastatin (LIPITOR) 80 MG tablet Take 1 tablet (80 mg total) by  mouth at bedtime. 90 tablet 3  . FLUoxetine (PROZAC) 40 MG capsule TAKE 1 CAPSULE (40 MG TOTAL) BY MOUTH DAILY. 30 capsule 2  . furosemide (LASIX) 20 MG tablet Take 1-2  tablets (20-40  mg total) by mouth daily. 135 tablet 3  . lisinopril (PRINIVIL,ZESTRIL) 2.5 MG tablet Take 2.5 mg by mouth daily.    [name] omeprazole (PRILOSEC) 40 MG capsule Take 40 mg by mouth 2 (two) times daily.    . silodosin (RAPAFLO) 8 MG CAPS capsule Take 1 capsule (8 mg total) by mouth daily with breakfast. 90 capsule 3  . warfarin (COUMADIN) 2.5 MG tablet Take 2.5 mg by mouth daily.    [name] zolpidem (AMBIEN) 10 MG tablet Take 0.5-1 tablets (5-10 mg total) by mouth at bedtime. 30 tablet 0    No current facility-administered medications on file prior to visit.    Past Medical History  Diagnosis Date  . Diverticulosis of colon (without mention of hemorrhage)   . Other pulmonary embolism and infarction   . Restless legs syndrome (RLS)   . Obesity, unspecified   . Gastroparesis   . Lumbago   . Anxiety state, unspecified   . Pure hypercholesterolemia     takes Atorvastatin daily  . Hemorrhoids   . Back pain, chronic   . Hx of gout     but doesn't take any meds  . Bruises easily     pt is on Coumadin  . Cancer     SKIN CANCER REMOVED  . Esophageal reflux     takes Omeprazole daily  . Insomnia     takes Ambien as nightly as needed  . Atrial fibrillation     takes Coumadin daily at bedtime  . Unspecified essential hypertension     takes Lisinopril daily  . Depressive disorder, not elsewhere classified     takes Prozac daily  . History of blood clots     behind right knee and then went into left lung 30yrs ago  . Peripheral edema     takes Furosemide daily  . Unspecified asthma(493.90)     as a child  . History of bronchitis     spring of 2014--Advair prn  . Pneumonia     last time about 65yrs ago  . Arthritis   . Joint swelling   . Joint pain   . Chronic back pain   . History of colon polyps   . History of kidney stones   . Hard of hearing     wears hearing aids  . Type II or unspecified type diabetes mellitus without mention of complication, not stated as uncontrolled     not taking any medications at this time  . Dysrhythmia     Atrial Fibrillation  . Peripheral vascular disease   . Pulmonary embolism     Past Surgical History  Procedure Laterality Date  . Knee surgery Right     x 4  . Artery repair      Left forearm  . Foot surgery      Left foot   . Replacement total knee Left     x 2  . Breath tek h pylori  01/08/2011    Procedure: BREATH TEK H PYLORI;  Surgeon: Pedro Earls, MD;  Location: [facility];  Service: General;   Laterality: N/A;  . Hand surgery      LEFT  . Leg surgery      FOR NECROTIZING FASCITIS L LEG AND GROIN  .  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 Shoulder arthroscopy    . Shoulder surgery Left 2014  . Back surgery      x 3  . Knee surgery Left     arthroscopy  . Eye surgery      cataract bil  . Injections in back      x 18  . Colonoscopy    . Cystoscopy    . Total shoulder arthroplasty Left 01/26/2013    Procedure: TOTAL SHOULDER ARTHROPLASTY;  Surgeon: Nita Sells, MD;  Location: [place];  Service: Orthopedics;  Laterality: Left;  Left total shoulder arthroplasty  . Anterior cervical decomp/discectomy fusion N/A 05/09/2013    Procedure: ANTERIOR CERVICAL DECOMPRESSION/DISCECTOMY FUSION CERVICAL THREE-FOUR,CERVICAL FOUR-FIVE,CERVICAL FIVE-SIX;  Surgeon: Floyce Stakes, MD;  Location: [facility];  Service: Neurosurgery;  Laterality: N/A;    History   Social History  . Marital Status: Married    Spouse Name: N/A    Number of Children: 1  . Years of Education: N/A   Occupational History  . Retired     Administrator   Social History Main Topics  . Smoking status: Never Smoker   . Smokeless tobacco: Never Used  . Alcohol Use: No  . Drug Use: No  . Sexual Activity: Yes   Other Topics Concern  . Not on file   Social History Narrative   DIET: 3 meals, F&V, some water, crystal light, No fast food   Exercise: walks treadmill 30 min      1 Caffeine drinks daily       No living will.           Family Status  Relation Status Death Age  . Mother Deceased     breast/uterine cancer  . Father Deceased     emphysema, heart disease  . Sister Deceased     alzheimer's disease  . Brother Deceased     struck by lightening  . Brother Deceased     colon cancer  . Brother Deceased     heart disease  .  Brother Alive     prostate cancer  . Daughter Alive     healthy    Review of Systems A complete 10 system ROS was obtained and was negative apart from what is mentioned.   Objective:   VITALS:   Filed Vitals:   01/02/14 1118  BP: 106/60  Pulse: 68  Height: 6\' 2"  (1.88 m)  Weight: 208 lb (94.348 kg)   Gen:  Appears stated age and in NAD. HEENT:  Normocephalic, atraumatic. The mucous membranes are moist. The superficial temporal arteries are without ropiness or tenderness. CV:  RRR Lungs: CTAB Neck:  No bruits.  R clavicle does protrude.   NEUROLOGICAL:  Orientation:  The patient is alert and oriented x 3.  Recent and remote memory are intact.  Attention span and concentration are normal.  Able to name objects and repeat without trouble.  Fund of knowledge is appropriate Cranial nerves: There is good facial symmetry, but there is L ptosis (I looked at [address] drivers license and it was the same). Extraocular muscles are intact and visual fields are full to confrontational testing. Speech is fluent and clear. Soft palate rises symmetrically and there is no tongue deviation. Hearing is intact to conversational tone. Tone: Tone is good throughout.  Mild gegenhalten. Sensation: Sensation is intact to light touch throughout. Coordination:  The patient has no dysdiadichokinesia or dysmetria. Motor: Strength is 5/5 in the bilateral upper and lower extremities.  Shoulder shrug is equal bilaterally.  There is no pronator drift.  There are no fasciculations noted. Gait and Station: The patient is mildly unstable  MOVEMENT EXAM: Tremor:  There is almost no tremor in the UE today.  The patient is mild trouble with archimedes spirals.   Rest tremor not seen today either.  Doesn't spill water when pouring from one glass to another like he did previously.      Assessment/Plan:   1.  Essential Tremor.  -Markedly better on low-dose primidone.  The patient would like to try to go up to two in the  AM (100 mg) and continue 50 mg at night.  I don't have any objection.  Risks, benefits, side effects and alternative therapies were discussed.  The opportunity to ask questions was given and they were answered to the best of my ability.  The patient expressed understanding and willingness to follow the outlined treatment protocols. 2.  Gait instability  -It is believed that ET alone can cause ataxia/balance changes due to changes in the cerebellar purkinje cell/climbing fiber synaptic transmission.   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  -offered balance PT and pt doesn't want.  Has tried in past without success.  He thinks related to prior c-spine surgery and states that it has actually gotten slowly better. 3.  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  Pt wanted lid lift but ophthalmology didn't want to proceed  5.  Return in about 4 months (around 05/02/2014).  Greater than 50% of 25 min visit in counseling re: safety.

## 2014-01-02 NOTE — Telephone Encounter (Signed)
Last office visit 07/13/2013.  Last refilled 10/19/2013 for #30 with no refills.  Ok to refill?

## 2014-01-02 NOTE — Patient Instructions (Signed)
1. Increase Primidone to 2 tablets in the morning, 1 tablet at night.  2. Keep previously scheduled appt.

## 2014-01-03 DIAGNOSIS — Z85828 Personal history of other malignant neoplasm of skin: Secondary | ICD-10-CM | POA: Diagnosis not present

## 2014-01-03 DIAGNOSIS — C44329 Squamous cell carcinoma of skin of other parts of face: Secondary | ICD-10-CM | POA: Diagnosis not present

## 2014-01-04 ENCOUNTER — Telehealth: Payer: Self-pay | Admitting: Family Medicine

## 2014-01-04 DIAGNOSIS — E7800 Pure hypercholesterolemia, unspecified: Secondary | ICD-10-CM

## 2014-01-04 DIAGNOSIS — D509 Iron deficiency anemia, unspecified: Secondary | ICD-10-CM

## 2014-01-04 NOTE — Telephone Encounter (Signed)
-----   Message from [place] sent at 12/28/2013  3:50 PM EST ----- Regarding: Cpx labs Fri 11/27, need orders Please order  future cpx labs for pt's upcoming lab appt. Thanks Aniceto Boss

## 2014-01-05 ENCOUNTER — Other Ambulatory Visit (INDEPENDENT_AMBULATORY_CARE_PROVIDER_SITE_OTHER): Payer: Medicare Other

## 2014-01-05 DIAGNOSIS — E7800 Pure hypercholesterolemia, unspecified: Secondary | ICD-10-CM

## 2014-01-05 LAB — LIPID PANEL
CHOL/HDL RATIO: 3
Cholesterol: 101 mg/dL (ref 0–200)
HDL: 36.6 mg/dL — AB (ref >39.00)
LDL Cholesterol: 46 mg/dL (ref 0–99)
NonHDL: 64.4
TRIGLYCERIDES: 90 mg/dL (ref 0.0–149.0)
VLDL: 18 mg/dL (ref 0.0–40.0)

## 2014-01-05 LAB — COMPREHENSIVE METABOLIC PANEL
ALT: 43 U/L (ref 0–53)
AST: 31 U/L (ref 0–37)
Albumin: 4.2 g/dL (ref 3.5–5.2)
Alkaline Phosphatase: 70 U/L (ref 39–117)
BUN: 21 mg/dL (ref 6–23)
CO2: 25 mEq/L (ref 19–32)
Calcium: 9.1 mg/dL (ref 8.4–10.5)
Chloride: 106 mEq/L (ref 96–112)
Creatinine, Ser: 1.4 mg/dL (ref 0.4–1.5)
GFR: 55.18 mL/min — ABNORMAL LOW (ref >60.00)
Glucose, Bld: 125 mg/dL — ABNORMAL HIGH (ref 70–99)
Potassium: 4.4 mEq/L (ref 3.5–5.1)
Sodium: 138 mEq/L (ref 135–145)
Total Bilirubin: 0.4 mg/dL (ref 0.2–1.2)
Total Protein: 7.1 g/dL (ref 6.0–8.3)

## 2014-01-10 ENCOUNTER — Ambulatory Visit (INDEPENDENT_AMBULATORY_CARE_PROVIDER_SITE_OTHER): Payer: Medicare Other

## 2014-01-10 DIAGNOSIS — Z5181 Encounter for therapeutic drug level monitoring: Secondary | ICD-10-CM | POA: Diagnosis not present

## 2014-01-10 DIAGNOSIS — I4891 Unspecified atrial fibrillation: Secondary | ICD-10-CM

## 2014-01-10 DIAGNOSIS — I824Y9 Acute embolism and thrombosis of unspecified deep veins of unspecified proximal lower extremity: Secondary | ICD-10-CM

## 2014-01-10 DIAGNOSIS — Z7901 Long term (current) use of anticoagulants: Secondary | ICD-10-CM | POA: Diagnosis not present

## 2014-01-10 LAB — POCT INR: INR: 3.7

## 2014-01-11 DIAGNOSIS — H2640 Unspecified secondary cataract: Secondary | ICD-10-CM | POA: Diagnosis not present

## 2014-01-11 DIAGNOSIS — H26492 Other secondary cataract, left eye: Secondary | ICD-10-CM | POA: Diagnosis not present

## 2014-01-12 ENCOUNTER — Encounter: Payer: Medicare Other | Admitting: Family Medicine

## 2014-01-18 ENCOUNTER — Other Ambulatory Visit: Payer: Self-pay | Admitting: Family Medicine

## 2014-01-19 NOTE — Telephone Encounter (Signed)
Last office visit 07/13/2013.  Last refilled 01/02/2014 for #30 with no refills.  OK to refill?

## 2014-01-22 DIAGNOSIS — Z96612 Presence of left artificial shoulder joint: Secondary | ICD-10-CM | POA: Diagnosis not present

## 2014-01-22 DIAGNOSIS — Z471 Aftercare following joint replacement surgery: Secondary | ICD-10-CM | POA: Diagnosis not present

## 2014-01-22 DIAGNOSIS — Z9889 Other specified postprocedural states: Secondary | ICD-10-CM | POA: Diagnosis not present

## 2014-01-23 ENCOUNTER — Encounter: Payer: Self-pay | Admitting: Family Medicine

## 2014-01-23 ENCOUNTER — Ambulatory Visit (INDEPENDENT_AMBULATORY_CARE_PROVIDER_SITE_OTHER): Payer: Medicare Other | Admitting: Family Medicine

## 2014-01-23 VITALS — BP 100/64 | HR 67 | Temp 98.3°F | Ht 71.5 in | Wt 202.5 lb

## 2014-01-23 DIAGNOSIS — Z0000 Encounter for general adult medical examination without abnormal findings: Secondary | ICD-10-CM

## 2014-01-23 DIAGNOSIS — Z7189 Other specified counseling: Secondary | ICD-10-CM

## 2014-01-23 DIAGNOSIS — E780 Pure hypercholesterolemia: Secondary | ICD-10-CM

## 2014-01-23 DIAGNOSIS — Z9884 Bariatric surgery status: Secondary | ICD-10-CM

## 2014-01-23 LAB — VITAMIN B12: Vitamin B-12: >1500 pg/mL — ABNORMAL HIGH (ref 211–911)

## 2014-01-23 LAB — VITAMIN D 25 HYDROXY (VIT D DEFICIENCY, FRACTURES): VITD: 23.89 ng/mL — ABNORMAL LOW (ref 30.00–100.00)

## 2014-01-23 LAB — FERRITIN: FERRITIN: 146.8 ng/mL (ref 22.0–322.0)

## 2014-01-23 NOTE — Progress Notes (Signed)
Pre visit review using our clinic review tool, if applicable. No additional management support is needed unless otherwise documented below in the visit note. 

## 2014-01-23 NOTE — Progress Notes (Signed)
I have personally reviewed the Medicare Annual Wellness questionnaire and have noted 1. The patient's medical and social history 2. Their use of alcohol, tobacco or illicit drugs 3. Their current medications and supplements 4. The patient's functional ability including ADL's, fall risks, home safety risks and hearing or visual             impairment. 5. Diet and physical activities 6. Evidence for depression or mood disorders The patients weight, height, BMI and visual acuity have been recorded in the chart I have made referrals, counseling and provided education to the patient based review of the above and I have provided the pt with a written personalized care plan for preventive services.  Elevated Cholesterol: LDL at goal < 70 on lipitor 20 mg daily  . Lab Results  Component Value Date   CHOL 101 01/05/2014   HDL 36.60* 01/05/2014   LDLCALC 46 01/05/2014   LDLDIRECT 147.0 03/07/2012   TRIG 90.0 01/05/2014   CHOLHDL 3 01/05/2014  Diet compliance:Great S/P gatric bypass,   Exercise: Daily treadmill 20-30 min  Other complaints:   Wt Readings from Last 3 Encounters:  01/23/14 202 lb 8 oz (91.853 kg)  01/02/14 208 lb (94.348 kg)  11/01/13 209 lb (94.802 kg)   Diabetes: Sees ENDO, A1C was 6.0 in 08/2013 Using medications without difficulties:on no.  Hypoglycemic episodes:None  Hyperglycemic episodes:None  Feet problems:none Blood Sugars averaging:FBS 100 eye exam within last year:None   Hypertension: Well controlled on lisinopril, toprol xl, lasix  BP Readings from Last 3 Encounters:  01/23/14 100/64  01/02/14 106/60  11/01/13 128/62  Using medication without problems or lightheadedness: None  Chest pain with exertion:None  Edema:None  Short of breath:None  Average home HWT:UUEK  Other issues:Afib on coumadin   Depression, well controlled on no med.   AFib followed by cards.  Tremor  ( markedly better on low-dose primidone) and peripheral neuropathy  causing gait instability: followed by Dr. Carles Collet, neuro.  Review of Systems  Constitutional: Negative for fever and fatigue.  HENT: Negative for ear pain.  Respiratory: Negative for wheezing.  Cardiovascular: Negative for chest pain.  Gastrointestinal: Negative for abdominal pain.  Skin: Negative for rash.    Objective:   Physical Exam  Constitutional: Vital signs are normal. He appears well-developed and well-nourished.  HENT:  Head: Normocephalic.  Right Ear: Hearing normal.  Left Ear: Hearing normal.  Nose: Nose normal.  Mouth/Throat: Oropharynx is clear and moist and mucous membranes are normal.  Neck: Trachea normal. Carotid bruit is not present. No mass and no thyromegaly present.  Cardiovascular: Normal rate and normal pulses. An irregular rhythm present. Exam reveals distant heart sounds. Exam reveals no gallop and no friction rub.  No murmur heard. No peripheral edema  Pulmonary/Chest: Effort normal and breath sounds normal. No respiratory distress.  Skin: Skin is warm, dry and intact. No rash noted.  Psychiatric: He has a normal mood and affect. His speech is normal and behavior is normal. Thought content normal.   Diabetic foot exam: per endo   ASSESSMENT and PLAN: The patient's preventative maintenance and recommended screening tests for an annual wellness exam were reviewed in full today. Brought up to date unless services declined.  Counselled on the importance of diet, exercise, and its role in overall health and mortality. The patient's FH and SH was reviewed, including their home life, tobacco status, and drug and alcohol status.     Uptodate except shingles. Colon: 2008, repeat in 10 years, Dr. Sharlett Iles.  Prostate: no indicated given age.

## 2014-01-23 NOTE — Patient Instructions (Addendum)
Stop at lab on way out for B12, ferritin and vit d. Look into shingles vaccine.  Continue healthy eating and regular exercise.

## 2014-01-23 NOTE — Addendum Note (Signed)
Addended by: Ellamae Sia on: 01/23/2014 09:26 AM   Modules accepted: Orders

## 2014-01-25 ENCOUNTER — Telehealth: Payer: Self-pay | Admitting: Family Medicine

## 2014-01-25 NOTE — Telephone Encounter (Signed)
Patient returned your call.

## 2014-01-25 NOTE — Telephone Encounter (Signed)
See Result Note on 01/23/2014 labs.

## 2014-01-29 ENCOUNTER — Other Ambulatory Visit: Payer: Self-pay | Admitting: Family Medicine

## 2014-01-29 NOTE — Telephone Encounter (Signed)
Last office visit 01/23/2014.  Last refilled 01/02/2014 for #30 with no refills.  Ok to refill?

## 2014-01-30 NOTE — Telephone Encounter (Signed)
Called to [facility]. 

## 2014-02-14 ENCOUNTER — Ambulatory Visit (INDEPENDENT_AMBULATORY_CARE_PROVIDER_SITE_OTHER): Payer: Medicare Other

## 2014-02-14 DIAGNOSIS — I4891 Unspecified atrial fibrillation: Secondary | ICD-10-CM | POA: Diagnosis not present

## 2014-02-14 DIAGNOSIS — Z7901 Long term (current) use of anticoagulants: Secondary | ICD-10-CM | POA: Diagnosis not present

## 2014-02-14 DIAGNOSIS — I824Y9 Acute embolism and thrombosis of unspecified deep veins of unspecified proximal lower extremity: Secondary | ICD-10-CM | POA: Diagnosis not present

## 2014-02-14 DIAGNOSIS — Z5181 Encounter for therapeutic drug level monitoring: Secondary | ICD-10-CM | POA: Diagnosis not present

## 2014-02-14 LAB — POCT INR: INR: 2.7

## 2014-02-21 DIAGNOSIS — M5382 Other specified dorsopathies, cervical region: Secondary | ICD-10-CM | POA: Diagnosis not present

## 2014-02-21 DIAGNOSIS — M9901 Segmental and somatic dysfunction of cervical region: Secondary | ICD-10-CM | POA: Diagnosis not present

## 2014-02-21 DIAGNOSIS — M5412 Radiculopathy, cervical region: Secondary | ICD-10-CM | POA: Diagnosis not present

## 2014-02-21 DIAGNOSIS — M542 Cervicalgia: Secondary | ICD-10-CM | POA: Diagnosis not present

## 2014-03-01 ENCOUNTER — Other Ambulatory Visit: Payer: Self-pay | Admitting: Family Medicine

## 2014-03-01 NOTE — Telephone Encounter (Signed)
Last office visit 01/23/2014.  Last refilled 01/30/2014 for #30 with no refills.  Ok to refill?

## 2014-03-05 NOTE — Telephone Encounter (Signed)
Called to [facility]. 

## 2014-03-11 ENCOUNTER — Other Ambulatory Visit: Payer: Self-pay | Admitting: Family Medicine

## 2014-03-15 DIAGNOSIS — E1142 Type 2 diabetes mellitus with diabetic polyneuropathy: Secondary | ICD-10-CM | POA: Diagnosis not present

## 2014-03-15 DIAGNOSIS — E663 Overweight: Secondary | ICD-10-CM | POA: Diagnosis not present

## 2014-03-15 DIAGNOSIS — Z6826 Body mass index (BMI) 26.0-26.9, adult: Secondary | ICD-10-CM | POA: Diagnosis not present

## 2014-03-15 DIAGNOSIS — Z9884 Bariatric surgery status: Secondary | ICD-10-CM | POA: Diagnosis not present

## 2014-03-22 ENCOUNTER — Ambulatory Visit (INDEPENDENT_AMBULATORY_CARE_PROVIDER_SITE_OTHER): Payer: Medicare Other

## 2014-03-22 DIAGNOSIS — I824Y9 Acute embolism and thrombosis of unspecified deep veins of unspecified proximal lower extremity: Secondary | ICD-10-CM | POA: Diagnosis not present

## 2014-03-22 DIAGNOSIS — Z7901 Long term (current) use of anticoagulants: Secondary | ICD-10-CM | POA: Diagnosis not present

## 2014-03-22 DIAGNOSIS — I4891 Unspecified atrial fibrillation: Secondary | ICD-10-CM

## 2014-03-22 DIAGNOSIS — Z5181 Encounter for therapeutic drug level monitoring: Secondary | ICD-10-CM | POA: Diagnosis not present

## 2014-03-22 LAB — POCT INR: INR: 2

## 2014-03-28 ENCOUNTER — Other Ambulatory Visit: Payer: Self-pay | Admitting: Family Medicine

## 2014-04-03 ENCOUNTER — Other Ambulatory Visit: Payer: Self-pay | Admitting: Family Medicine

## 2014-04-03 NOTE — Telephone Encounter (Signed)
Last office visit 01/23/2014.  Last refilled 03/04/2014 for #30 with no refills.  Ok to refill?

## 2014-04-03 NOTE — Telephone Encounter (Signed)
Called to [facility]. 

## 2014-04-04 ENCOUNTER — Ambulatory Visit (INDEPENDENT_AMBULATORY_CARE_PROVIDER_SITE_OTHER): Payer: Medicare Other

## 2014-04-04 DIAGNOSIS — I824Y9 Acute embolism and thrombosis of unspecified deep veins of unspecified proximal lower extremity: Secondary | ICD-10-CM | POA: Diagnosis not present

## 2014-04-04 DIAGNOSIS — Z7901 Long term (current) use of anticoagulants: Secondary | ICD-10-CM | POA: Diagnosis not present

## 2014-04-04 DIAGNOSIS — Z5181 Encounter for therapeutic drug level monitoring: Secondary | ICD-10-CM

## 2014-04-04 DIAGNOSIS — I4891 Unspecified atrial fibrillation: Secondary | ICD-10-CM

## 2014-04-04 LAB — POCT INR: INR: 1.8

## 2014-04-11 DIAGNOSIS — H04123 Dry eye syndrome of bilateral lacrimal glands: Secondary | ICD-10-CM | POA: Diagnosis not present

## 2014-04-18 ENCOUNTER — Ambulatory Visit (INDEPENDENT_AMBULATORY_CARE_PROVIDER_SITE_OTHER): Payer: Medicare Other

## 2014-04-18 DIAGNOSIS — I824Y9 Acute embolism and thrombosis of unspecified deep veins of unspecified proximal lower extremity: Secondary | ICD-10-CM | POA: Diagnosis not present

## 2014-04-18 DIAGNOSIS — Z7901 Long term (current) use of anticoagulants: Secondary | ICD-10-CM | POA: Diagnosis not present

## 2014-04-18 DIAGNOSIS — Z5181 Encounter for therapeutic drug level monitoring: Secondary | ICD-10-CM | POA: Diagnosis not present

## 2014-04-18 DIAGNOSIS — I4891 Unspecified atrial fibrillation: Secondary | ICD-10-CM

## 2014-04-18 LAB — POCT INR: INR: 2.2

## 2014-04-20 DIAGNOSIS — B372 Candidiasis of skin and nail: Secondary | ICD-10-CM | POA: Diagnosis not present

## 2014-04-20 DIAGNOSIS — Z85828 Personal history of other malignant neoplasm of skin: Secondary | ICD-10-CM | POA: Diagnosis not present

## 2014-04-20 DIAGNOSIS — L304 Erythema intertrigo: Secondary | ICD-10-CM | POA: Diagnosis not present

## 2014-04-27 DIAGNOSIS — M1711 Unilateral primary osteoarthritis, right knee: Secondary | ICD-10-CM | POA: Diagnosis not present

## 2014-05-02 ENCOUNTER — Encounter: Payer: Self-pay | Admitting: Neurology

## 2014-05-02 ENCOUNTER — Ambulatory Visit (INDEPENDENT_AMBULATORY_CARE_PROVIDER_SITE_OTHER): Payer: Medicare Other | Admitting: Neurology

## 2014-05-02 VITALS — BP 120/80 | HR 80 | Ht 75.0 in | Wt 188.0 lb

## 2014-05-02 DIAGNOSIS — R251 Tremor, unspecified: Secondary | ICD-10-CM

## 2014-05-02 DIAGNOSIS — G250 Essential tremor: Secondary | ICD-10-CM

## 2014-05-02 DIAGNOSIS — G252 Other specified forms of tremor: Principal | ICD-10-CM

## 2014-05-02 NOTE — Progress Notes (Signed)
Subjective:    [name] was seen in consultation in the movement disorder clinic at the request of Dr. Joya Salm.  His PCP is Eliezer Lofts, MD.  This patient is accompanied in the office by his child who supplements the history. The evaluation is for tremor.  I reviewed prior records that are available to me.  Looking back in his medical record, there are notes back to 2012 that indicate severe essential tremor.  Pt reports tremor has been worse since recent surgeries.  He had a shoulder surgery for bone spurs first and then had a shoulder replacement on the L and then had a cervical fusion surgery.  Prior to that, he had gastric bypass in 2013.    The patient is a 74 y.o. right handed male with a history of tremor.  Pt reports that tremor started after an episode of sepsis about 5 years ago.  Once he left the hospital, it was in the bilateral UE, overall slight but it has gotten worse over 8 months.  He cannot eat peas or the roll off the spoon.   There is no family hx of tremor.    Affected by caffeine:  No. Affected by alcohol: doesn't drink Affected by stress:  No. Affected by fatigue:  No. Spills soup if on spoon:  Yes.   Spills glass of liquid if full:  No. Affects ADL's (tying shoes, brushing teeth, etc):  No.  Pt had neck hematomas after surgery, which caused dysphagia.  A modified barium swallow was done on 08/14/2013 indicate severe cervical face dysphagia, moderate to severe pharyngeal phase dysphagia.  There is recommended if crush his medications or takes them in liquid form.  Outpatient speech language pathology and nutrition consult was also recommended.  11/01/13 update:  Pt is returning for f/u.  Started primidone last visit.   Pt states that it has been helpful but thinks that we could go up on the medication.  No SE.  Had carpal tunnel release on the L.  States that it didn't help.  Has also gone to speech therapy for swallowing, as recommended and that did not  help.  01/02/14 update:  The patient returns for follow-up today.  He has a history of essential tremor.  Last visit, his primidone was increased 50 mg twice a day.  "I think that if you increased it one more time, we will have it just right."  I got a note from his ENT physician at [facility].  They agreed that dysphagia was related to prior cervical spine surgery.  They told him that loss of balance is not due to an inner ear pathology and he was told to follow-up here.  He does have a history of diabetic peripheral neuropathy.  Pt states that balance has been off since the neck surgery and thinks that it is related.  He states that it actually has gotten slowly better.  Balance isn't great but he isn't falling anymore either which used to be a big problem.  05/02/14 update:  His primidone was increased last visit so that he is taking 100 mg in the morning and he remains on 50 mg at night.  The patient states that tremor has tremor is doing well.  He states that tremor is well controlled.  He only spills something or notices tremor 1-2 days a month, which is markedly improved.  No SE with the tremor.    Current/Previously tried tremor medications: no  Current medications that may exacerbate  tremor:  n/a  Outside reports reviewed: historical medical records, lab reports and referral letter/letters.  Allergies  Allergen Reactions  . Sulfacetamide Sodium     REACTION: Throat swelling  . Adhesive [Tape]     Tears skin    Current Outpatient Prescriptions on File Prior to Visit  Medication Sig Dispense Refill  . atorvastatin (LIPITOR) 80 MG tablet Take 1 tablet by mouth at  bedtime 90 tablet 3  . diazepam (VALIUM) 5 MG tablet Take 5 mg by mouth every 6 (six) hours as needed.   0  . FLUoxetine (PROZAC) 20 MG capsule Take 1 capsule by mouth  daily 90 capsule 1  . furosemide (LASIX) 20 MG tablet Take 1 to 2 tablets by  mouth daily 180 tablet 1  . gabapentin (NEURONTIN) 300 MG capsule Take 300  mg by mouth at bedtime.     [name] lisinopril (PRINIVIL,ZESTRIL) 2.5 MG tablet Take 2.5 mg by mouth daily.    . metFORMIN (GLUCOPHAGE) 500 MG tablet Take 1 tablet by mouth  twice a day 180 tablet 1  . omeprazole (PRILOSEC) 40 MG capsule Take 1 capsule by mouth  daily 90 capsule 1  . primidone (MYSOLINE) 50 MG tablet Take 2 tablets in the morning, 1 tablet in the evening 270 tablet 1  . silodosin (RAPAFLO) 8 MG CAPS capsule Take 1 capsule (8 mg total) by mouth daily with breakfast. 90 capsule 3  . warfarin (COUMADIN) 2.5 MG tablet Take 2.5 mg by mouth daily.    [name] zolpidem (AMBIEN) 10 MG tablet TAKE 1/2-1 TABLET BY MOUTH AT BEDTIME AS NEEDED 30 tablet 0   No current facility-administered medications on file prior to visit.    Past Medical History  Diagnosis Date  . Diverticulosis of colon (without mention of hemorrhage)   . Other pulmonary embolism and infarction   . Restless legs syndrome (RLS)   . Obesity, unspecified   . Gastroparesis   . Lumbago   . Anxiety state, unspecified   . Pure hypercholesterolemia     takes Atorvastatin daily  . Hemorrhoids   . Back pain, chronic   . Hx of gout     but doesn't take any meds  . Bruises easily     pt is on Coumadin  . Cancer     SKIN CANCER REMOVED  . Esophageal reflux     takes Omeprazole daily  . Insomnia     takes Ambien as nightly as needed  . Atrial fibrillation     takes Coumadin daily at bedtime  . Unspecified essential hypertension     takes Lisinopril daily  . Depressive disorder, not elsewhere classified     takes Prozac daily  . History of blood clots     behind right knee and then went into left lung 43yrs ago  . Peripheral edema     takes Furosemide daily  . Unspecified asthma(493.90)     as a child  . History of bronchitis     spring of 2014--Advair prn  . Pneumonia     last time about 63yrs ago  . Arthritis   . Joint swelling   . Joint pain   . Chronic back pain   . History of colon polyps   . History of  kidney stones   . Hard of hearing     wears hearing aids  . Type II or unspecified type diabetes mellitus without mention of complication, not stated as uncontrolled     not taking  any medications at this time  . Dysrhythmia     Atrial Fibrillation  . Peripheral vascular disease   . Pulmonary embolism     Past Surgical History  Procedure Laterality Date  . Knee surgery Right     x 4  . Artery repair      Left forearm  . Foot surgery      Left foot   . Replacement total knee Left     x 2  . Breath tek h pylori  01/08/2011    Procedure: BREATH TEK H PYLORI;  Surgeon: Pedro Earls, MD;  Location: [facility];  Service: General;  Laterality: N/A;  . Hand surgery      LEFT  . Leg surgery      FOR NECROTIZING FASCITIS L LEG AND GROIN  . 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 Shoulder arthroscopy    . Shoulder surgery Left 2014  . Back surgery      x 3  . Knee surgery Left     arthroscopy  . Eye surgery      cataract bil  . Injections in back      x 18  . Colonoscopy    . Cystoscopy    . Total shoulder arthroplasty Left 01/26/2013    Procedure: TOTAL SHOULDER ARTHROPLASTY;  Surgeon: Nita Sells, MD;  Location: [facility];  Service: Orthopedics;  Laterality: Left;  Left total shoulder arthroplasty  . Anterior cervical decomp/discectomy fusion N/A 05/09/2013    Procedure: ANTERIOR CERVICAL DECOMPRESSION/DISCECTOMY FUSION CERVICAL THREE-FOUR,CERVICAL FOUR-FIVE,CERVICAL FIVE-SIX;  Surgeon: Floyce Stakes, MD;  Location: [facility];  Service: Neurosurgery;  Laterality: N/A;    History   Social History  . Marital Status: Married    Spouse Name: N/A  . Number of Children: 1  . Years of Education: N/A   Occupational History  . Retired     Administrator   Social History Main Topics  . Smoking status: Never  Smoker   . Smokeless tobacco: Never Used  . Alcohol Use: No  . Drug Use: No  . Sexual Activity: Yes   Other Topics Concern  . Not on file   Social History Narrative   DIET: 3 meals, F&V, some water, crystal light, No fast food   Exercise: walks treadmill 30 min      1 Caffeine drinks daily       No living will.           Family Status  Relation Status Death Age  . Mother Deceased     breast/uterine cancer  . Father Deceased     emphysema, heart disease  . Sister Deceased     alzheimer's disease  . Brother Deceased     struck by lightening  . Brother Deceased     colon cancer  . Brother Deceased     heart disease  . Brother Alive     prostate cancer  . Daughter Alive     healthy    Review of Systems Continues with knee pain and just had another cortisone injection on Friday.  A complete 10 system ROS was obtained and was negative apart from what is mentioned.   Objective:   VITALS:   Filed Vitals:   05/02/14 0931  BP: 120/80  Pulse: 80  Height: 6\' 3"  (1.905 m)  Weight: 188 lb (85.276 kg)   Gen:  Appears stated age and in NAD. HEENT:  Normocephalic, atraumatic. The mucous membranes are moist. The superficial temporal arteries are without ropiness or tenderness. CV:  RRR Lungs: CTAB Neck:  No bruits.    NEUROLOGICAL:  Orientation:  The patient is alert and oriented x 3.  Recent and remote memory are intact.  Attention span and concentration are normal.  Able to name objects and repeat without trouble.  Fund of knowledge is appropriate Cranial nerves: There is good facial symmetry, but there is L ptosis (I looked at 0272 drivers license and it was the same). Extraocular muscles are intact and visual fields are full to confrontational testing. Speech is fluent and clear. Soft palate rises symmetrically and there is no tongue deviation. Hearing is intact to conversational tone. Tone: Tone is good throughout.  Mild gegenhalten. Sensation: Sensation is intact  to light touch throughout. Coordination:  The patient has no dysdiadichokinesia or dysmetria. Motor: Strength is 5/5 in the bilateral upper and lower extremities.  Shoulder shrug is equal bilaterally.  There is no pronator drift.   Gait and Station: The patient ambulates well today  MOVEMENT EXAM: Tremor:  There is minimal tremor of the outstretched hands.  The patient has mild trouble with archimedes spirals.   Minimal tremor can be seen/felt at rest     Assessment/Plan:   1.  Essential Tremor.  -improved on primidone.  Continue with two in the AM (100 mg) and continue 50 mg at night.  Risks, benefits, side effects and alternative therapies were discussed.  The opportunity to ask questions was given and they were answered to the best of my ability.  The patient expressed understanding and willingness to follow the outlined treatment protocols. 2.  Gait instability  -It is believed that ET alone can cause ataxia/balance changes due to changes in the cerebellar purkinje cell/climbing fiber synaptic transmission.   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  -looked better in this regard today 3.  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  Pt wanted lid lift but ophthalmology didn't want to proceed  5.  Return in about 6 months (around 11/02/2014).  Greater than 50% of 25 min visit in counseling re: safety.

## 2014-05-07 DIAGNOSIS — M542 Cervicalgia: Secondary | ICD-10-CM | POA: Diagnosis not present

## 2014-05-07 DIAGNOSIS — M9901 Segmental and somatic dysfunction of cervical region: Secondary | ICD-10-CM | POA: Diagnosis not present

## 2014-05-08 ENCOUNTER — Other Ambulatory Visit: Payer: Self-pay | Admitting: Family Medicine

## 2014-05-08 NOTE — Telephone Encounter (Signed)
Last office visit 01/23/2014.  Last refilled 04/03/2014 for #30 with no refills.  Ok to refill?

## 2014-05-08 NOTE — Telephone Encounter (Signed)
Called to [facility]. 

## 2014-05-09 ENCOUNTER — Ambulatory Visit (INDEPENDENT_AMBULATORY_CARE_PROVIDER_SITE_OTHER): Payer: Medicare Other

## 2014-05-09 DIAGNOSIS — I4891 Unspecified atrial fibrillation: Secondary | ICD-10-CM

## 2014-05-09 DIAGNOSIS — I824Y9 Acute embolism and thrombosis of unspecified deep veins of unspecified proximal lower extremity: Secondary | ICD-10-CM

## 2014-05-09 DIAGNOSIS — Z7901 Long term (current) use of anticoagulants: Secondary | ICD-10-CM

## 2014-05-09 DIAGNOSIS — Z5181 Encounter for therapeutic drug level monitoring: Secondary | ICD-10-CM

## 2014-05-09 LAB — POCT INR: INR: 7.1

## 2014-05-14 DIAGNOSIS — R351 Nocturia: Secondary | ICD-10-CM | POA: Diagnosis not present

## 2014-05-14 DIAGNOSIS — N529 Male erectile dysfunction, unspecified: Secondary | ICD-10-CM | POA: Diagnosis not present

## 2014-05-14 DIAGNOSIS — N401 Enlarged prostate with lower urinary tract symptoms: Secondary | ICD-10-CM | POA: Diagnosis not present

## 2014-05-14 DIAGNOSIS — N281 Cyst of kidney, acquired: Secondary | ICD-10-CM | POA: Diagnosis not present

## 2014-05-16 ENCOUNTER — Ambulatory Visit (INDEPENDENT_AMBULATORY_CARE_PROVIDER_SITE_OTHER): Payer: Medicare Other

## 2014-05-16 DIAGNOSIS — I4891 Unspecified atrial fibrillation: Secondary | ICD-10-CM | POA: Diagnosis not present

## 2014-05-16 DIAGNOSIS — Z5181 Encounter for therapeutic drug level monitoring: Secondary | ICD-10-CM | POA: Diagnosis not present

## 2014-05-16 DIAGNOSIS — Z7901 Long term (current) use of anticoagulants: Secondary | ICD-10-CM | POA: Diagnosis not present

## 2014-05-16 DIAGNOSIS — I824Y9 Acute embolism and thrombosis of unspecified deep veins of unspecified proximal lower extremity: Secondary | ICD-10-CM

## 2014-05-16 LAB — POCT INR: INR: 1.6

## 2014-05-28 DIAGNOSIS — M1711 Unilateral primary osteoarthritis, right knee: Secondary | ICD-10-CM | POA: Diagnosis not present

## 2014-05-30 ENCOUNTER — Ambulatory Visit (INDEPENDENT_AMBULATORY_CARE_PROVIDER_SITE_OTHER): Payer: Medicare Other

## 2014-05-30 DIAGNOSIS — Z7901 Long term (current) use of anticoagulants: Secondary | ICD-10-CM | POA: Diagnosis not present

## 2014-05-30 DIAGNOSIS — I4891 Unspecified atrial fibrillation: Secondary | ICD-10-CM | POA: Diagnosis not present

## 2014-05-30 DIAGNOSIS — I824Y9 Acute embolism and thrombosis of unspecified deep veins of unspecified proximal lower extremity: Secondary | ICD-10-CM

## 2014-05-30 DIAGNOSIS — Z5181 Encounter for therapeutic drug level monitoring: Secondary | ICD-10-CM | POA: Diagnosis not present

## 2014-05-30 LAB — POCT INR: INR: 2.8

## 2014-05-31 DIAGNOSIS — N529 Male erectile dysfunction, unspecified: Secondary | ICD-10-CM | POA: Diagnosis not present

## 2014-06-04 DIAGNOSIS — M1711 Unilateral primary osteoarthritis, right knee: Secondary | ICD-10-CM | POA: Diagnosis not present

## 2014-06-05 ENCOUNTER — Telehealth: Payer: Self-pay | Admitting: Family Medicine

## 2014-06-05 DIAGNOSIS — E119 Type 2 diabetes mellitus without complications: Secondary | ICD-10-CM

## 2014-06-05 NOTE — Telephone Encounter (Signed)
Agree no retinopathy.  He is a diabetic.. Followed by ENDO. Not sure why no longer on list. Please add back DM 2, no comp.

## 2014-06-05 NOTE — Telephone Encounter (Signed)
Eye Exam Report requested from [facility] so I can update his health maintenance.

## 2014-06-05 NOTE — Telephone Encounter (Signed)
I don't see a Dx of Diabetes on his problem list.  Yet his Health Maintenance is set up like he does.  Will forward to Dr. Diona Browner to review.  Office note from [facility] is hard to read but I do not see anything about diabetic retinopathy being address at last eye exam on 04/11/2014.  Report placed in Dr. Rometta Emery in box to review.

## 2014-06-05 NOTE — Telephone Encounter (Signed)
An automated system called and wanted to know if he had an eye exam this year  Dr bowing @ [facility]  2 month ago

## 2014-06-06 DIAGNOSIS — E1149 Type 2 diabetes mellitus with other diabetic neurological complication: Secondary | ICD-10-CM | POA: Insufficient documentation

## 2014-06-06 NOTE — Telephone Encounter (Signed)
Diabetes Dx added back to problem list.

## 2014-06-07 ENCOUNTER — Telehealth: Payer: Self-pay | Admitting: Cardiology

## 2014-06-07 NOTE — Telephone Encounter (Signed)
New Message    Patient needs to stop coumadin for surgery for a penile prothesis 5 days prior to surgery. Surgery has not been set up yet. Please give [facility] a call.  Thanks

## 2014-06-07 NOTE — Telephone Encounter (Signed)
Returned call to [facility] with Dr.Ottlin's office.Dr.Hochrein out of office today will send message to him for advice.Fax note to [facility] at fax # [phone].

## 2014-06-11 ENCOUNTER — Other Ambulatory Visit: Payer: Self-pay | Admitting: Family Medicine

## 2014-06-11 DIAGNOSIS — M1711 Unilateral primary osteoarthritis, right knee: Secondary | ICD-10-CM | POA: Diagnosis not present

## 2014-06-14 NOTE — Telephone Encounter (Signed)
She is still waiting for clarence,please fax asap. Please fax [phone] Att: Carolyne Fiscal

## 2014-06-16 ENCOUNTER — Other Ambulatory Visit: Payer: Self-pay | Admitting: Cardiology

## 2014-06-18 DIAGNOSIS — M7750 Other enthesopathy of unspecified foot: Secondary | ICD-10-CM | POA: Diagnosis not present

## 2014-06-18 DIAGNOSIS — M1711 Unilateral primary osteoarthritis, right knee: Secondary | ICD-10-CM | POA: Diagnosis not present

## 2014-06-19 NOTE — Telephone Encounter (Signed)
[name] is calling following up on the clearance sheet that was sent over on 5/5 about holding his coumadin for 5 days prior to his procedure. Please f/u with her

## 2014-06-19 NOTE — Telephone Encounter (Signed)
Dr hochrein does not have form.  Will forward to pharm md for clearance to hold coumadin

## 2014-06-20 NOTE — Telephone Encounter (Signed)
Pt with CHADS2 score of 2 (HTN, DM), per protocol okay to hold warfarin x 5 days prior to penile prosthesis surgery. Restart same day or day after per instructions from urology.

## 2014-06-22 ENCOUNTER — Other Ambulatory Visit: Payer: Self-pay | Admitting: Urology

## 2014-06-22 DIAGNOSIS — T1511XA Foreign body in conjunctival sac, right eye, initial encounter: Secondary | ICD-10-CM | POA: Diagnosis not present

## 2014-06-23 ENCOUNTER — Other Ambulatory Visit: Payer: Self-pay | Admitting: Family Medicine

## 2014-06-23 NOTE — Telephone Encounter (Signed)
Last office visit 01/23/2014.  Last refilled 05/08/2014 for #30 with no refill.  Ok to refill?

## 2014-06-25 DIAGNOSIS — M1711 Unilateral primary osteoarthritis, right knee: Secondary | ICD-10-CM | POA: Diagnosis not present

## 2014-06-25 NOTE — Telephone Encounter (Signed)
Called to [facility]. 

## 2014-06-26 DIAGNOSIS — M9901 Segmental and somatic dysfunction of cervical region: Secondary | ICD-10-CM | POA: Diagnosis not present

## 2014-06-26 DIAGNOSIS — M542 Cervicalgia: Secondary | ICD-10-CM | POA: Diagnosis not present

## 2014-06-26 DIAGNOSIS — G44209 Tension-type headache, unspecified, not intractable: Secondary | ICD-10-CM | POA: Diagnosis not present

## 2014-06-27 ENCOUNTER — Ambulatory Visit (INDEPENDENT_AMBULATORY_CARE_PROVIDER_SITE_OTHER): Payer: Medicare Other

## 2014-06-27 DIAGNOSIS — Z7901 Long term (current) use of anticoagulants: Secondary | ICD-10-CM | POA: Diagnosis not present

## 2014-06-27 DIAGNOSIS — Z5181 Encounter for therapeutic drug level monitoring: Secondary | ICD-10-CM | POA: Diagnosis not present

## 2014-06-27 DIAGNOSIS — I824Y9 Acute embolism and thrombosis of unspecified deep veins of unspecified proximal lower extremity: Secondary | ICD-10-CM

## 2014-06-27 DIAGNOSIS — I4891 Unspecified atrial fibrillation: Secondary | ICD-10-CM

## 2014-06-27 LAB — POCT INR: INR: 3

## 2014-06-27 NOTE — Patient Instructions (Addendum)
YOUR PROCEDURE IS SCHEDULED ON :  07/02/14  REPORT TO [facility] MAIN ENTRANCE FOLLOW SIGNS TO SHORT STAY CENTER AT :  5:30 am  CALL THIS NUMBER IF YOU HAVE PROBLEMS THE MORNING OF SURGERY [phone]  REMEMBER:  DO NOT EAT FOOD OR DRINK LIQUIDS AFTER MIDNIGHT  TAKE THESE MEDICINES THE MORNING OF SURGERY:  OMEPRAZOLE  YOU MAY NOT HAVE ANY METAL ON YOUR BODY INCLUDING HAIR PINS AND PIERCING'S. DO NOT WEAR JEWELRY, MAKEUP, LOTIONS, POWDERS OR PERFUMES. DO NOT WEAR NAIL POLISH. DO NOT SHAVE 48 HRS PRIOR TO SURGERY. MEN MAY SHAVE FACE AND NECK.  DO NOT [place]. [facility] IS NOT RESPONSIBLE FOR VALUABLES.  CONTACTS, DENTURES OR PARTIALS MAY NOT BE WORN TO SURGERY. LEAVE SUITCASE IN CAR. CAN BE BROUGHT TO ROOM AFTER SURGERY.  PATIENTS DISCHARGED THE DAY OF SURGERY WILL NOT BE ALLOWED TO DRIVE HOME.  PLEASE READ OVER THE FOLLOWING INSTRUCTION SHEETS _________________________________________________________________________________                                          [name] - PREPARING FOR SURGERY  Before surgery, you can play an important role.  Because skin is not sterile, your skin needs to be as free of germs as possible.  You can reduce the number of germs on your skin by washing with CHG (chlorahexidine gluconate) soap before surgery.  CHG is an antiseptic cleaner which kills germs and bonds with the skin to continue killing germs even after washing. Please DO NOT use if you have an allergy to CHG or antibacterial soaps.  If your skin becomes reddened/irritated stop using the CHG and inform your nurse when you arrive at Short Stay. Do not shave (including legs and underarms) for at least 48 hours prior to the first CHG shower.  You may shave your face. Please follow these instructions carefully:   1.  Shower with CHG Soap the night before surgery and the  morning of Surgery.   2.  If you choose to wash your hair, wash your hair  first as usual with your  normal  Shampoo.   3.  After you shampoo, rinse your hair and body thoroughly to remove the  shampoo.                                         4.  Use CHG as you would any other liquid soap.  You can apply chg directly  to the skin and wash . Gently wash with scrungie or clean wascloth    5.  Apply the CHG Soap to your body ONLY FROM THE NECK DOWN.   Do not use on open                           Wound or open sores. Avoid contact with eyes, ears mouth and genitals (private parts).                        Genitals (private parts) with your normal soap.              6.  Wash thoroughly, paying special attention to the area where your surgery  will be performed.  7.  Thoroughly rinse your body with warm water from the neck down.   8.  DO NOT shower/wash with your normal soap after using and rinsing off  the CHG Soap .                9.  Pat yourself dry with a clean towel.             10.  Wear clean night clothes to bed after shower             11.  Place clean sheets on your bed the night of your first shower and do not  sleep with pets.  Day of Surgery : Do not apply any lotions/deodorants the morning of surgery.  Please wear clean clothes to the hospital/surgery center.  FAILURE TO FOLLOW THESE INSTRUCTIONS MAY RESULT IN THE CANCELLATION OF YOUR SURGERY    PATIENT SIGNATURE_________________________________  ______________________________________________________________________

## 2014-06-28 ENCOUNTER — Encounter (HOSPITAL_COMMUNITY)
Admission: RE | Admit: 2014-06-28 | Discharge: 2014-06-28 | Disposition: A | Discharge status: Home / Self Care | Payer: Medicare Other | Source: Ambulatory Visit | Attending: Urology | Admitting: Urology

## 2014-06-28 ENCOUNTER — Encounter (HOSPITAL_COMMUNITY): Payer: Self-pay

## 2014-06-28 DIAGNOSIS — M9901 Segmental and somatic dysfunction of cervical region: Secondary | ICD-10-CM | POA: Diagnosis not present

## 2014-06-28 DIAGNOSIS — I4891 Unspecified atrial fibrillation: Secondary | ICD-10-CM | POA: Diagnosis not present

## 2014-06-28 DIAGNOSIS — M542 Cervicalgia: Secondary | ICD-10-CM | POA: Diagnosis not present

## 2014-06-28 DIAGNOSIS — Z01818 Encounter for other preprocedural examination: Secondary | ICD-10-CM | POA: Insufficient documentation

## 2014-06-28 DIAGNOSIS — I10 Essential (primary) hypertension: Secondary | ICD-10-CM | POA: Diagnosis not present

## 2014-06-28 DIAGNOSIS — G44209 Tension-type headache, unspecified, not intractable: Secondary | ICD-10-CM | POA: Diagnosis not present

## 2014-06-28 HISTORY — DX: Male erectile dysfunction, unspecified: N52.9

## 2014-06-28 HISTORY — DX: Personal history of other endocrine, nutritional and metabolic disease: Z86.39

## 2014-06-28 LAB — CBC
HCT: 40.8 % (ref 39.0–52.0)
Hemoglobin: 13.4 g/dL (ref 13.0–17.0)
MCH: 31.5 pg (ref 26.0–34.0)
MCHC: 32.8 g/dL (ref 30.0–36.0)
MCV: 95.8 fL (ref 78.0–100.0)
Platelets: 189 10*3/uL (ref 150–400)
RBC: 4.26 MIL/uL (ref 4.22–5.81)
RDW: 14.1 % (ref 11.5–15.5)
WBC: 7.2 10*3/uL (ref 4.0–10.5)

## 2014-06-28 LAB — BASIC METABOLIC PANEL
Anion gap: 5 (ref 5–15)
BUN: 17 mg/dL (ref 6–20)
CALCIUM: 8.9 mg/dL (ref 8.9–10.3)
CHLORIDE: 108 mmol/L (ref 101–111)
CO2: 23 mmol/L (ref 22–32)
Creatinine, Ser: 1.21 mg/dL (ref 0.61–1.24)
GFR calc Af Amer: >60 mL/min (ref >60)
GFR calc non Af Amer: 58 mL/min — ABNORMAL LOW (ref >60)
GLUCOSE: 111 mg/dL — AB (ref 65–99)
Potassium: 4.1 mmol/L (ref 3.5–5.1)
Sodium: 136 mmol/L (ref 135–145)

## 2014-06-28 LAB — APTT: aPTT: 41 seconds — ABNORMAL HIGH (ref 24–37)

## 2014-06-28 LAB — PROTIME-INR
INR: 2.58 — ABNORMAL HIGH (ref 0.00–1.49)
PROTHROMBIN TIME: 27.9 s — AB (ref 11.6–15.2)

## 2014-06-30 NOTE — Anesthesia Preprocedure Evaluation (Addendum)
Anesthesia Evaluation  Patient identified by MRN, date of birth, ID band Patient awake    Reviewed: NPO status , Patient's Chart, lab work & pertinent test results, reviewed documented beta blocker date and time   Airway Mallampati: II   Neck ROM: Full    Dental  (+) Edentulous Lower, Edentulous Upper   Pulmonary asthma , COPD breath sounds clear to auscultation        Cardiovascular hypertension, Pt. on medications + dysrhythmias Atrial Fibrillation Rhythm:Irregular  ECHO 2013 60%, EKG 2016 AF   Neuro/Psych Anxiety Depression    GI/Hepatic negative GI ROS, Neg liver ROS,   Endo/Other  diabetes, Type 2, Oral Hypoglycemic Agents  Renal/GU      Musculoskeletal   Abdominal (+)  Abdomen: soft.    Peds  Hematology 13/40   Anesthesia Other Findings   Reproductive/Obstetrics                            Anesthesia Physical Anesthesia Plan  ASA: III  Anesthesia Plan: General   Post-op Pain Management:    Induction: Intravenous  Airway Management Planned: Oral ETT  Additional Equipment:   Intra-op Plan:   Post-operative Plan: Extubation in OR  Informed Consent: I have reviewed the patients History and Physical, chart, labs and discussed the procedure including the risks, benefits and alternatives for the proposed anesthesia with the patient or authorized representative who has indicated his/her understanding and acceptance.     Plan Discussed with:   Anesthesia Plan Comments: (Has been on coumadin for AF, ask if still taking)        Anesthesia Quick Evaluation

## 2014-07-01 MED ORDER — GENTAMICIN SULFATE 40 MG/ML IJ SOLN
440.0000 mg | INTRAVENOUS | Status: AC
  Administered 2014-07-02: 440 mg via INTRAVENOUS
  Filled 2014-07-01 (×2): qty 11

## 2014-07-02 ENCOUNTER — Observation Stay (HOSPITAL_COMMUNITY)
Admission: RE | Admit: 2014-07-02 | Discharge: 2014-07-03 | Disposition: A | Discharge status: Home / Self Care | Payer: Medicare Other | Source: Ambulatory Visit | Attending: Urology | Admitting: Urology

## 2014-07-02 ENCOUNTER — Encounter (HOSPITAL_COMMUNITY): Payer: Self-pay | Admitting: *Deleted

## 2014-07-02 ENCOUNTER — Ambulatory Visit (HOSPITAL_COMMUNITY): Payer: Medicare Other | Admitting: Anesthesiology

## 2014-07-02 ENCOUNTER — Encounter (HOSPITAL_COMMUNITY)
Admission: RE | Disposition: A | Discharge status: Home / Self Care | Payer: Self-pay | Source: Ambulatory Visit | Attending: Urology

## 2014-07-02 DIAGNOSIS — K219 Gastro-esophageal reflux disease without esophagitis: Secondary | ICD-10-CM | POA: Insufficient documentation

## 2014-07-02 DIAGNOSIS — E119 Type 2 diabetes mellitus without complications: Secondary | ICD-10-CM | POA: Diagnosis not present

## 2014-07-02 DIAGNOSIS — N529 Male erectile dysfunction, unspecified: Secondary | ICD-10-CM | POA: Diagnosis present

## 2014-07-02 DIAGNOSIS — Z86711 Personal history of pulmonary embolism: Secondary | ICD-10-CM | POA: Diagnosis not present

## 2014-07-02 DIAGNOSIS — Z87442 Personal history of urinary calculi: Secondary | ICD-10-CM | POA: Diagnosis not present

## 2014-07-02 DIAGNOSIS — F419 Anxiety disorder, unspecified: Secondary | ICD-10-CM | POA: Diagnosis not present

## 2014-07-02 DIAGNOSIS — I10 Essential (primary) hypertension: Principal | ICD-10-CM | POA: Insufficient documentation

## 2014-07-02 DIAGNOSIS — Z7901 Long term (current) use of anticoagulants: Secondary | ICD-10-CM | POA: Insufficient documentation

## 2014-07-02 DIAGNOSIS — Z882 Allergy status to sulfonamides status: Secondary | ICD-10-CM | POA: Insufficient documentation

## 2014-07-02 DIAGNOSIS — I739 Peripheral vascular disease, unspecified: Secondary | ICD-10-CM | POA: Diagnosis not present

## 2014-07-02 DIAGNOSIS — Z86718 Personal history of other venous thrombosis and embolism: Secondary | ICD-10-CM | POA: Diagnosis not present

## 2014-07-02 DIAGNOSIS — I4891 Unspecified atrial fibrillation: Secondary | ICD-10-CM | POA: Insufficient documentation

## 2014-07-02 DIAGNOSIS — J45909 Unspecified asthma, uncomplicated: Secondary | ICD-10-CM | POA: Insufficient documentation

## 2014-07-02 DIAGNOSIS — E780 Pure hypercholesterolemia: Secondary | ICD-10-CM | POA: Insufficient documentation

## 2014-07-02 DIAGNOSIS — M1990 Unspecified osteoarthritis, unspecified site: Secondary | ICD-10-CM | POA: Diagnosis not present

## 2014-07-02 DIAGNOSIS — F329 Major depressive disorder, single episode, unspecified: Secondary | ICD-10-CM | POA: Diagnosis not present

## 2014-07-02 DIAGNOSIS — J449 Chronic obstructive pulmonary disease, unspecified: Secondary | ICD-10-CM | POA: Insufficient documentation

## 2014-07-02 HISTORY — PX: PENILE PROSTHESIS IMPLANT: SHX240

## 2014-07-02 LAB — GLUCOSE, CAPILLARY
Glucose-Capillary: 128 mg/dL — ABNORMAL HIGH (ref 65–99)
Glucose-Capillary: 112 mg/dL — ABNORMAL HIGH (ref 65–99)

## 2014-07-02 LAB — PROTIME-INR
INR: 1.35 (ref 0.00–1.49)
PROTHROMBIN TIME: 16.7 s — AB (ref 11.6–15.2)

## 2014-07-02 SURGERY — INSERTION, PENILE PROSTHESIS, INFLATABLE
Anesthesia: General | Site: Scrotum

## 2014-07-02 MED ORDER — CEFAZOLIN SODIUM 1-5 GM-% IV SOLN: 1.0000 g | Freq: Three times a day (TID) | INTRAVENOUS | Status: DC

## 2014-07-02 MED ORDER — SODIUM CHLORIDE 0.9 % IR SOLN
Status: AC
  Filled 2014-07-02: qty 1

## 2014-07-02 MED ORDER — BUPIVACAINE-EPINEPHRINE (PF) 0.5% -1:200000 IJ SOLN
INTRAMUSCULAR | Status: DC | PRN
  Administered 2014-07-02: 5 mL

## 2014-07-02 MED ORDER — SODIUM CHLORIDE 0.9 % IR SOLN
Status: DC | PRN
  Administered 2014-07-02: 1000 mL

## 2014-07-02 MED ORDER — LIDOCAINE HCL (CARDIAC) 20 MG/ML IV SOLN
INTRAVENOUS | Status: AC
  Filled 2014-07-02: qty 5

## 2014-07-02 MED ORDER — PROMETHAZINE HCL 25 MG/ML IJ SOLN: 6.2500 mg | INTRAMUSCULAR | Status: DC | PRN

## 2014-07-02 MED ORDER — PHENYLEPHRINE HCL 10 MG/ML IJ SOLN
INTRAMUSCULAR | Status: DC | PRN
  Administered 2014-07-02: 40 ug via INTRAVENOUS
  Administered 2014-07-02: 80 ug via INTRAVENOUS
  Administered 2014-07-02 (×2): 40 ug via INTRAVENOUS

## 2014-07-02 MED ORDER — HYDROMORPHONE HCL 1 MG/ML IJ SOLN
INTRAMUSCULAR | Status: AC
  Filled 2014-07-02: qty 1

## 2014-07-02 MED ORDER — MIDAZOLAM HCL 2 MG/2ML IJ SOLN
INTRAMUSCULAR | Status: AC
  Filled 2014-07-02: qty 2

## 2014-07-02 MED ORDER — MIDAZOLAM HCL 5 MG/5ML IJ SOLN
INTRAMUSCULAR | Status: DC | PRN
  Administered 2014-07-02: 0.5 mg via INTRAVENOUS

## 2014-07-02 MED ORDER — GABAPENTIN 300 MG PO CAPS
300.0000 mg | ORAL_CAPSULE | Freq: Every day | ORAL | Status: DC
  Administered 2014-07-02: 300 mg via ORAL
  Filled 2014-07-02: qty 1

## 2014-07-02 MED ORDER — CEFAZOLIN SODIUM-DEXTROSE 2-3 GM-% IV SOLR
INTRAVENOUS | Status: AC
  Filled 2014-07-02: qty 50

## 2014-07-02 MED ORDER — CISATRACURIUM BESYLATE (PF) 10 MG/5ML IV SOLN
INTRAVENOUS | Status: DC | PRN
  Administered 2014-07-02: 8 mg via INTRAVENOUS

## 2014-07-02 MED ORDER — ONDANSETRON HCL 4 MG/2ML IJ SOLN
INTRAMUSCULAR | Status: DC | PRN
  Administered 2014-07-02 (×2): 2 mg via INTRAVENOUS

## 2014-07-02 MED ORDER — LISINOPRIL 2.5 MG PO TABS
2.5000 mg | ORAL_TABLET | Freq: Every morning | ORAL | Status: DC
  Administered 2014-07-02 – 2014-07-03 (×2): 2.5 mg via ORAL
  Filled 2014-07-02 (×2): qty 1

## 2014-07-02 MED ORDER — FENTANYL CITRATE (PF) 100 MCG/2ML IJ SOLN
INTRAMUSCULAR | Status: DC | PRN
  Administered 2014-07-02: 50 ug via INTRAVENOUS
  Administered 2014-07-02 (×2): 25 ug via INTRAVENOUS
  Administered 2014-07-02: 50 ug via INTRAVENOUS
  Administered 2014-07-02: 25 ug via INTRAVENOUS
  Administered 2014-07-02: 50 ug via INTRAVENOUS
  Administered 2014-07-02: 25 ug via INTRAVENOUS

## 2014-07-02 MED ORDER — FENTANYL CITRATE (PF) 100 MCG/2ML IJ SOLN
25.0000 ug | INTRAMUSCULAR | Status: DC | PRN
  Administered 2014-07-02 (×3): 50 ug via INTRAVENOUS

## 2014-07-02 MED ORDER — PHENYLEPHRINE HCL 10 MG/ML IJ SOLN
INTRAMUSCULAR | Status: AC
  Filled 2014-07-02: qty 1

## 2014-07-02 MED ORDER — SODIUM CHLORIDE 0.9 % IV SOLN
10.0000 mg | INTRAVENOUS | Status: DC | PRN
  Administered 2014-07-02: 40 ug/min via INTRAVENOUS

## 2014-07-02 MED ORDER — DIPHENHYDRAMINE HCL 12.5 MG/5ML PO ELIX: 12.5000 mg | ORAL_SOLUTION | Freq: Four times a day (QID) | ORAL | Status: DC | PRN

## 2014-07-02 MED ORDER — BUPIVACAINE-EPINEPHRINE 0.5% -1:200000 IJ SOLN
INTRAMUSCULAR | Status: AC
  Filled 2014-07-02: qty 1

## 2014-07-02 MED ORDER — TAMSULOSIN HCL 0.4 MG PO CAPS
0.4000 mg | ORAL_CAPSULE | Freq: Every day | ORAL | Status: DC
  Administered 2014-07-02: 0.4 mg via ORAL
  Filled 2014-07-02 (×2): qty 1

## 2014-07-02 MED ORDER — FENTANYL CITRATE (PF) 250 MCG/5ML IJ SOLN
INTRAMUSCULAR | Status: AC
  Filled 2014-07-02: qty 5

## 2014-07-02 MED ORDER — ONDANSETRON HCL 4 MG/2ML IJ SOLN: 4.0000 mg | INTRAMUSCULAR | Status: DC | PRN

## 2014-07-02 MED ORDER — MORPHINE SULFATE 2 MG/ML IJ SOLN
2.0000 mg | INTRAMUSCULAR | Status: DC | PRN
  Administered 2014-07-02: 4 mg via INTRAVENOUS
  Administered 2014-07-02: 2 mg via INTRAVENOUS
  Administered 2014-07-02 – 2014-07-03 (×4): 4 mg via INTRAVENOUS
  Filled 2014-07-02 (×5): qty 2
  Filled 2014-07-02: qty 1

## 2014-07-02 MED ORDER — FENTANYL CITRATE (PF) 100 MCG/2ML IJ SOLN
INTRAMUSCULAR | Status: AC
  Filled 2014-07-02: qty 2

## 2014-07-02 MED ORDER — SODIUM CHLORIDE 0.9 % IV SOLN
INTRAVENOUS | Status: DC
  Administered 2014-07-02 (×2): via INTRAVENOUS

## 2014-07-02 MED ORDER — ACETAMINOPHEN 10 MG/ML IV SOLN
1000.0000 mg | Freq: Once | INTRAVENOUS | Status: AC
  Administered 2014-07-02: 1000 mg via INTRAVENOUS
  Filled 2014-07-02: qty 100

## 2014-07-02 MED ORDER — LIDOCAINE HCL (CARDIAC) 20 MG/ML IV SOLN
INTRAVENOUS | Status: DC | PRN
  Administered 2014-07-02: 75 mg via INTRAVENOUS

## 2014-07-02 MED ORDER — FLUOXETINE HCL 20 MG PO CAPS
40.0000 mg | ORAL_CAPSULE | Freq: Every day | ORAL | Status: DC
  Administered 2014-07-03: 40 mg via ORAL
  Filled 2014-07-02: qty 2

## 2014-07-02 MED ORDER — MEPERIDINE HCL 50 MG/ML IJ SOLN
6.2500 mg | INTRAMUSCULAR | Status: DC | PRN
Start: 2014-07-02 — End: 2014-07-02

## 2014-07-02 MED ORDER — BACITRACIN ZINC 500 UNIT/GM EX OINT
TOPICAL_OINTMENT | CUTANEOUS | Status: AC
  Filled 2014-07-02: qty 28.35

## 2014-07-02 MED ORDER — PANTOPRAZOLE SODIUM 40 MG PO TBEC
40.0000 mg | DELAYED_RELEASE_TABLET | Freq: Every day | ORAL | Status: DC
  Administered 2014-07-02 – 2014-07-03 (×2): 40 mg via ORAL
  Filled 2014-07-02 (×3): qty 1

## 2014-07-02 MED ORDER — DIPHENHYDRAMINE HCL 50 MG/ML IJ SOLN: 12.5000 mg | Freq: Four times a day (QID) | INTRAMUSCULAR | Status: DC | PRN

## 2014-07-02 MED ORDER — ONDANSETRON HCL 4 MG/2ML IJ SOLN
INTRAMUSCULAR | Status: AC
  Filled 2014-07-02: qty 2

## 2014-07-02 MED ORDER — PROPOFOL 10 MG/ML IV BOLUS
INTRAVENOUS | Status: AC
  Filled 2014-07-02: qty 20

## 2014-07-02 MED ORDER — LACTATED RINGERS IV SOLN
INTRAVENOUS | Status: DC | PRN
  Administered 2014-07-02 (×2): via INTRAVENOUS

## 2014-07-02 MED ORDER — PROPOFOL 10 MG/ML IV BOLUS
INTRAVENOUS | Status: DC | PRN
  Administered 2014-07-02: 25 mg via INTRAVENOUS
  Administered 2014-07-02: 150 mg via INTRAVENOUS

## 2014-07-02 MED ORDER — MEPERIDINE HCL 50 MG/ML IJ SOLN: 6.2500 mg | INTRAMUSCULAR | Status: DC | PRN

## 2014-07-02 MED ORDER — DEXAMETHASONE SODIUM PHOSPHATE 10 MG/ML IJ SOLN
INTRAMUSCULAR | Status: AC
  Filled 2014-07-02: qty 1

## 2014-07-02 MED ORDER — BACITRACIN ZINC 500 UNIT/GM EX OINT
TOPICAL_OINTMENT | CUTANEOUS | Status: DC | PRN
  Administered 2014-07-02: 1 via TOPICAL

## 2014-07-02 MED ORDER — ZOLPIDEM TARTRATE 5 MG PO TABS
5.0000 mg | ORAL_TABLET | Freq: Every evening | ORAL | Status: DC | PRN
  Administered 2014-07-02: 5 mg via ORAL
  Filled 2014-07-02: qty 1

## 2014-07-02 MED ORDER — CHLORHEXIDINE GLUCONATE CLOTH 2 % EX PADS: MEDICATED_PAD | Freq: Once | CUTANEOUS | Status: DC

## 2014-07-02 MED ORDER — CETYLPYRIDINIUM CHLORIDE 0.05 % MT LIQD
7.0000 mL | Freq: Two times a day (BID) | OROMUCOSAL | Status: DC
  Administered 2014-07-02 – 2014-07-03 (×2): 7 mL via OROMUCOSAL

## 2014-07-02 MED ORDER — OXYCODONE-ACETAMINOPHEN 5-325 MG PO TABS
1.0000 | ORAL_TABLET | ORAL | Status: DC | PRN
  Administered 2014-07-02 – 2014-07-03 (×3): 2 via ORAL
  Filled 2014-07-02 (×3): qty 2

## 2014-07-02 MED ORDER — CEFAZOLIN SODIUM 1-5 GM-% IV SOLN
1.0000 g | Freq: Three times a day (TID) | INTRAVENOUS | Status: AC
  Administered 2014-07-02 (×2): 1 g via INTRAVENOUS
  Filled 2014-07-02 (×2): qty 50

## 2014-07-02 MED ORDER — FENTANYL CITRATE (PF) 100 MCG/2ML IJ SOLN: 25.0000 ug | INTRAMUSCULAR | Status: DC | PRN

## 2014-07-02 MED ORDER — SUCCINYLCHOLINE CHLORIDE 20 MG/ML IJ SOLN
INTRAMUSCULAR | Status: DC | PRN
  Administered 2014-07-02: 100 mg via INTRAVENOUS

## 2014-07-02 MED ORDER — CEFAZOLIN SODIUM-DEXTROSE 2-3 GM-% IV SOLR
2.0000 g | INTRAVENOUS | Status: AC
  Administered 2014-07-02: 2 g via INTRAVENOUS

## 2014-07-02 MED ORDER — ATORVASTATIN CALCIUM 80 MG PO TABS
80.0000 mg | ORAL_TABLET | Freq: Every day | ORAL | Status: DC
  Filled 2014-07-02: qty 1

## 2014-07-02 MED ORDER — PRIMIDONE 50 MG PO TABS
50.0000 mg | ORAL_TABLET | Freq: Two times a day (BID) | ORAL | Status: DC
  Administered 2014-07-02 – 2014-07-03 (×3): 50 mg via ORAL
  Filled 2014-07-02 (×3): qty 1

## 2014-07-02 MED ORDER — FUROSEMIDE 20 MG PO TABS
20.0000 mg | ORAL_TABLET | Freq: Every day | ORAL | Status: DC
  Administered 2014-07-03: 20 mg via ORAL
  Filled 2014-07-02: qty 1

## 2014-07-02 MED ORDER — STERILE WATER FOR IRRIGATION IR SOLN
Status: DC | PRN
  Administered 2014-07-02: 500 mL

## 2014-07-02 SURGICAL SUPPLY — 46 items
APL SKNCLS STERI-STRIP NONHPOA (GAUZE/BANDAGES/DRESSINGS) ×1
BAG URINE DRAINAGE (UROLOGICAL SUPPLIES) ×2 IMPLANT
BANDAGE COBAN STERILE 2 (GAUZE/BANDAGES/DRESSINGS) IMPLANT
BENZOIN TINCTURE PRP APPL 2/3 (GAUZE/BANDAGES/DRESSINGS) ×2 IMPLANT
BLADE HEX COATED 2.75 (ELECTRODE) ×2 IMPLANT
BLADE SURG 15 STRL LF DISP TIS (BLADE) ×2 IMPLANT
BLADE SURG 15 STRL SS (BLADE) ×4
CATH FOLEY 2WAY SLVR  5CC 16FR (CATHETERS) ×1
CATH FOLEY 2WAY SLVR 5CC 16FR (CATHETERS) ×1 IMPLANT
CHLORAPREP W/TINT 26ML (MISCELLANEOUS) ×1 IMPLANT
COVER MAYO STAND STRL (DRAPES) ×2 IMPLANT
COVER SURGICAL LIGHT HANDLE (MISCELLANEOUS) ×2 IMPLANT
DISSECTOR ROUND CHERRY 3/8 STR (MISCELLANEOUS) ×1 IMPLANT
DRAPE EXTREMITY T 121X128X90 (DRAPE) ×2 IMPLANT
DRAPE INCISE IOBAN 66X45 STRL (DRAPES) IMPLANT
DRSG TEGADERM 4X4.75 (GAUZE/BANDAGES/DRESSINGS) ×2 IMPLANT
ELECT REM PT RETURN 9FT ADLT (ELECTROSURGICAL) ×2
ELECTRODE REM PT RTRN 9FT ADLT (ELECTROSURGICAL) ×1 IMPLANT
GAUZE SPONGE 4X4 12PLY STRL (GAUZE/BANDAGES/DRESSINGS) ×1 IMPLANT
GAUZE SPONGE 4X4 16PLY XRAY LF (GAUZE/BANDAGES/DRESSINGS) ×1 IMPLANT
GLOVE BIOGEL M 8.0 STRL (GLOVE) ×4 IMPLANT
GOWN STRL REUS W/TWL XL LVL3 (GOWN DISPOSABLE) ×2 IMPLANT
KIT ASSEMBLY MENTOR (KITS) ×1 IMPLANT
KIT BASIN OR (CUSTOM PROCEDURE TRAY) ×2 IMPLANT
NEEDLE HYPO 22GX1.5 SAFETY (NEEDLE) ×2 IMPLANT
NS IRRIG 1000ML POUR BTL (IV SOLUTION) ×2 IMPLANT
PACK GENERAL/GYN (CUSTOM PROCEDURE TRAY) ×2 IMPLANT
PLUG CATH AND CAP STER (CATHETERS) ×2 IMPLANT
PROS TITAN SCROT 0 ANG 18CM (Erectile Restoration) ×2 IMPLANT
PROSTHESIS TTN SCRO 0 ANG 18CM (Erectile Restoration) IMPLANT
RESERVOIR 75CC LOCKOUT BIOFLEX (Erectile Restoration) ×1 IMPLANT
RETRACTOR WILSON SYSTEM (INSTRUMENTS) ×2 IMPLANT
SCRUB PCMX 4 OZ (MISCELLANEOUS) ×1 IMPLANT
SPONGE LAP 4X18 X RAY DECT (DISPOSABLE) IMPLANT
STRIP CLOSURE SKIN 1/2X4 (GAUZE/BANDAGES/DRESSINGS) ×2 IMPLANT
SUPPORT SCROTAL LG STRP (MISCELLANEOUS) ×2 IMPLANT
SURGILUBE 3G PEEL PACK STRL (MISCELLANEOUS) ×10 IMPLANT
SUT CHROMIC 3 0 SH 27 (SUTURE) ×4 IMPLANT
SUT MNCRL AB 4-0 PS2 18 (SUTURE) ×2 IMPLANT
SUT PDS AB 2-0 CT2 27 (SUTURE) ×6 IMPLANT
SUT VIC AB 2-0 UR6 27 (SUTURE) ×4 IMPLANT
SYR 20CC LL (SYRINGE) ×2 IMPLANT
SYR 50ML LL SCALE MARK (SYRINGE) ×4 IMPLANT
SYR CONTROL 10ML LL (SYRINGE) ×2 IMPLANT
TOWEL OR 17X26 10 PK STRL BLUE (TOWEL DISPOSABLE) ×4 IMPLANT
WATER STERILE IRR 500ML POUR (IV SOLUTION) ×2 IMPLANT

## 2014-07-02 NOTE — Anesthesia Procedure Notes (Signed)
Procedure Name: Intubation Date/Time: 07/02/2014 7:30 AM Performed by: Ofilia Neas Pre-anesthesia Checklist: Patient identified, Emergency Drugs available, Suction available, Patient being monitored and Timeout performed Patient Re-evaluated:Patient Re-evaluated prior to inductionOxygen Delivery Method: Circle system utilized Preoxygenation: Pre-oxygenation with 100% oxygen Intubation Type: IV induction Ventilation: Mask ventilation without difficulty Laryngoscope Size: Mac and 4 Grade View: Grade II Tube type: Oral Tube size: 7.5 mm Number of attempts: 1 Airway Equipment and Method: Stylet Placement Confirmation: ETT inserted through vocal cords under direct vision,  positive ETCO2 and breath sounds checked- equal and bilateral Secured at: 21 cm Tube secured with: Tape (paper tape)

## 2014-07-02 NOTE — H&P (Signed)
[name] is a 74 year old male patient of Dr. Janice Norrie seen for erectile dysfunction.   History of Present Illness Genital herpes: This was discovered in 8/84.    History of calculus disease: He has passed multiple stones but has never required any form of surgical intervention.  Stone analysis: 80% uric acid and 10% calcium oxalate.    Bilateral renal cysts: He was noted on CT scan done in 10/14 to have bilateral simple renal cysts that had remained remained stable.    LUTS: He has reported experiencing nocturia and a weak stream.  Current therapy: Rapaflo    Erectile dysfunction: This began in about 2005. He has tried Cialis, Viagra and Staxin as well as PGE intracavernosal injections without response.    Interval history: He is right handed. He has no significant voiding complaints. 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   Past Medical History Problems  1. History of Anxiety (F41.9) 2. History of Arthritis 3. History of Asthma (J45.909) 4. History of Atrial fibrillation (I48.91) 5. History of Calculus of left ureter (N20.1) 6. History of Gross hematuria (R31.0) 7. History of depression (Z86.59) 8. History of diabetes mellitus (Z86.39) 9. History of esophageal reflux (Z87.19) 10. History of herpes simplex infection (Z86.19) 11. History of hypercholesterolemia (Z86.39) 12. History of hypertension (Z86.79) 13. History of kidney stones (Z87.442) 14. History of Hydronephrosis (N13.30) 15. History of Pulmonary Embolism 16. 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 History of Back Surgery 2. History of Foot Repair 3. History of Hand Surgery 4. History of Knee Surgery 5. History of Neck Surgery 6. History of Shoulder Surgery 7. History of Shoulder Surgery Left  Current Meds 1. Atorvastatin  Calcium 20 MG Oral Tablet;  Therapy: 28JGO1157 to Recorded 2. Furosemide 20 MG Oral Tablet;  Therapy: (Recorded:18Aug2014) to Recorded 3. Lisinopril 5 MG Oral Tablet;  Therapy: [id number] to Recorded 4. Metoprolol Succinate ER 25 MG Oral Tablet Extended Release 24 Hour;  Therapy: 20Jul2011 to Recorded 5. Omeprazole 40 MG Oral Capsule Delayed Release;  Therapy: 20Jun2014 to Recorded 6. Rapaflo 8 MG Oral Capsule; TAKE 1 CAPSULE BY MOUTH DAILY;  Therapy: 10Oct2012 to (Evaluate:13Aug2016)  Requested for: [phone]; Last  Rx:19Aug2015 Ordered 7. Topamax 25 MG Oral Tablet;  Therapy: (Recorded:18Aug2014) to Recorded 8. Triamcinolone Acetonide 0.1 % External Cream;  Therapy: 46OEH2122 to Recorded 9. Warfarin Sodium 2.5 MG Oral Tablet;  Therapy: (Recorded:18Aug2014) to Recorded 10. Zolpidem Tartrate 5 MG Oral Tablet;   Therapy: 48GNO0370 to Recorded  Allergies Medication  1. Sulfa Drugs  Family History Problems  1. No pertinent family history : Mother  Social History Problems  1. Denied: History of Alcohol Use 2. Marital History - Currently Married 3. Never A Smoker 4. Occupation:   Retired Administrator. 5. Denied: History of Tobacco Use  Review of Systems Genitourinary, constitutional, skin, eye, otolaryngeal, hematologic/lymphatic, cardiovascular, pulmonary, endocrine, musculoskeletal, gastrointestinal, neurological and psychiatric system(s) were reviewed and pertinent findings if present are noted and are otherwise negative.  Genitourinary: nocturia and weak urinary stream.    Vitals Vital Signs   Blood Pressure: 97 / 70 Temperature: 97.2 F Heart Rate: 59   Physical Exam Constitutional: Well nourished and well developed. No acute distress. The patient appears well hydrated.  ENT:. The oropharynx is normal.  Neck: The appearance of the neck is normal and no  neck mass is present. There is no jugular-venous distention.  Pulmonary: No respiratory distress, normal  respiratory rhythm and effort and clear bilateral breath sounds.  Cardiovascular: Heart rate and rhythm are normal. The arterial pulses are normal. No peripheral edema.  Abdomen: The abdomen is obese. The abdomen is soft and nontender. Left CVA tenderness. Bowel sounds are normal. No hernias are palpable. No hepatosplenomegaly noted.  Rectal: Rectal exam demonstrates normal sphincter tone, no tenderness and no masses. Estimated prostate size is 2+. The prostate has no nodularity and is not tender. The left seminal vesicle is nonpalpable. The right seminal vesicle is nonpalpable. The perineum is normal on inspection.  Skin: Normal skin turgor and normal skin color and pigmentation.  Neuro/Psych:. Mood and affect are appropriate.   Assessment  We discussed the procedure of penile prosthesis implantation at length today. I first went over the 2 types of prosthesis available being a semirigid device and a three-piece inflatable device. He is interested in the inflatable device and therefore I have given him written information from [place] including a DVD and shown him a sample of the device. We then discussed how an erection with an inflatable penile prosthesis is achieved and the difference between this erection and a natural erection being primarily the lack of tumescence of the glans. We then discussed the incision used for placement of the prosthesis and also I discussed the planned location for his reservoir. We discussed placement of the pump in the right hemiscrotum into his right-handed. I then went over the risks and complications with him and also discussed the fact that there is a 5% lifetime mechanical failure rate as well as the fact that if the device becomes infected and this would necessitate its complete removal. We then discussed the need for an overnight stay in the hospital as well as the anticipated postoperative course. We discussed the probability of success and I then answered all of  his questions to his satisfaction. He has elected to proceed with 3-piece inflatable penile prosthesis implantation via a scrotal approach.   Plan   He will be scheduled for a 3 piece inflatable penile prosthesis implantation.

## 2014-07-02 NOTE — Op Note (Signed)
PATIENT:  [name]  PRE-OPERATIVE DIAGNOSIS:  Organic erectile dysfunction  POST-OPERATIVE DIAGNOSIS:  Same  PROCEDURE:  Procedure(s): 3 piece inflatable in all prosthesis Surveyor, minerals)  SURGEON:  Claybon Jabs  INDICATION: He has had long-standing organic erectile dysfunction and has been treated with, unsuccessfully, with oral agents, vacuum erection device and intracavernosal injection therapy without success. He has elected to proceed with prosthesis implantation.  ANESTHESIA:  General  EBL:  Minimal  Device: 3 piece Titan: 75 cc reservoir, 18 cm cylinders and 2 cm rear-tip extenders on right and left sides  LOCAL MEDICATIONS USED:  None  SPECIMEN: None  DISPOSITION OF SPECIMEN:  N/A  Description of procedure: The patient was taken to the major operating room, placed on the table and administered general anesthesia in the supine position. His genitalia was then scrubbed for 10 minutes, painted and then sterilely draped. An official timeout was then performed.  A 16 French Foley catheter was placed in the bladder and the bladder was drained and the catheter was plugged. A transverse scrotal incision proximal to the base of the penis was then made and then blunt dissection was used to expose the corpus cavernosum on the right and left sides. This was further cleared of overlying tissue using sharp technique. 2-0 Vicryl sutures were then placed in the corpus cavernosum to service stay sutures and then an incision was then made in the corpus cavernosum first on the left-hand side with the knife and then extended proximally and distally with the curved Mayo scissors. I then dilated the corpus cavernosum with the Hegar dilators starting at 10 and progressing to 13 proximally and distally. I then irrigated the corpus cavernosum with antibiotic solution and measured the distance proximally and distally from the stay suture and was found to be 10 proximal and 10 distal. I then turned my  attention to the contralateral corpus cavernosum and placed my stay sutures, made my corporotomy and dilated the corpus cavernosum in an identical fashion. This was measured and also was found to be 10 proximal and 10 distal. It was irrigated with antibiotic solution as was the scrotum. I then chose an 18 cm cylinder set with 2 cm rear-tip extenders and these were prepped while I prepared the site for reservoir placement.  I used blunt technique to dissect up to the external inguinal ring on the right-hand side.. I swept the spermatic cord laterally and then was able to bluntly dissect with my index finger through the floor of the inguinal ring. I drained the bladder completely with a Foley catheter and then developed a space behind the symphysis pubis for the reservoir. I irrigated the space with antibiotic solution and then placed the reservoir in this location. I then filled the reservoir with 75 cc of sterile saline.  Attention was redirected to the corporotomies where the cylinders were then placed by first fixing the suture to the distal aspect of the right cylinder to a straight needle. This was then loaded on the [facility] inserter and passed through the corporotomy and distally. I then advanced the straight needle with the Furlow inserter out through the glans and this was grasped with a hemostat and pulled through the glans and the suture was secured with a hemostat. I then performed an identical maneuver on the contralateral side. After this was performed I irrigated both corpus cavernosum and inserted the distal portion of the cylinder through the corporotomies and pulled this to the end of the corpora with the suture. The  proximal aspect with the rear-tip extender was then passed through the corporotomy and into the seated position on each side. I then connected reservoir tubing to a syringe filled with sterile saline and inflated the device. I noted a good straight erection with both cylinders  equidistant under the glans and no buckling of the cylinders. I therefore deflated the device and closed the corporotomies with 2-0 PDS suture. The cylinder was then connected to the pump after excising the excess tubing with appropriate shodded hemostats in place and then I used the supplied connectors to make the connection. I then again cycled the device with the pump and it cycled properly. I deflated the device and pumped it up about three quarters of the way to aid with hemostasis. I irrigated the scrotum once again with antibiotic solution.  I then developed the scrotal pocket in the right hemiscrotum in place the pump in this location. I irrigated the wound one last time with antibiotic irrigation and then closed the deep scrotal tissue over the tubing and pump with running 3-0 chromic suture. A second layer was then closed over this first layer with running 3-0 chromic and then the skin was closed with running 3-0 chromic. Antibiotic ointment, a 4 x 4 and a Tegaderm were then applied to the scrotum. I then wrapped the scrotum and penis with a Curlex. The catheter was connected to closed system drainage and the patient was awakened and taken recovery room in stable and satisfactory condition. He tolerated the procedure well and there were no intraoperative complications. Needle sponge and instrument counts were correct at the end of the operation.  PLAN OF CARE: He will be observed overnight with intravenous antibiotics and pain medication with plan for discharge in the a.m.   PATIENT DISPOSITION:  PACU - hemodynamically stable.

## 2014-07-02 NOTE — Transfer of Care (Signed)
Immediate Anesthesia Transfer of Care Note  Patient: [name]  Procedure(s) Performed: Procedure(s): PENILE PROTHESIS INFLATABLE 3 PIECE (COLOPLAST) SCROTAL APPROACH (N/A)  Patient Location: PACU  Anesthesia Type:General  Level of Consciousness: awake, oriented, patient cooperative, lethargic and responds to stimulation  Airway & Oxygen Therapy: Patient Spontanous Breathing and Patient connected to face mask oxygen  Post-op Assessment: Report given to RN, Post -op Vital signs reviewed and stable and Patient moving all extremities  Post vital signs: Reviewed and stable  Last Vitals:  Filed Vitals:   07/02/14 0534  BP: 110/71  Pulse: 75  Temp: 36.6 C  Resp: 16    Complications: No apparent anesthesia complications

## 2014-07-02 NOTE — Progress Notes (Signed)
Patient ID: BROADUS COSTILLA, male   DOB: 07-Nov-1940, 74 y.o.   MRN: [id number] Night of surgery note  Subjective: The patient is doing well.  No complaints.  Objective: Vital signs in last 24 hours: Temp:  [97.8 F (36.6 C)-98.6 F (37 C)] 98.6 F (37 C) (05/23 1446) Pulse Rate:  [62-99] 62 (05/23 1446) Resp:  [16-21] 16 (05/23 1040) BP: (110-128)/(59-78) 111/76 mmHg (05/23 1446) SpO2:  [100 %] 100 % (05/23 1446) Weight:  [88.905 kg (196 lb)] 88.905 kg (196 lb) (05/23 0601)  Intake/Output this shift: Total I/O In: 2090 [P.O.:240; I.V.:1850] Out: 125 [Urine:125]  Physical Exam:  General: Alert and oriented. Abdomen: Soft, Nondistended. His dressing is intact and dry.  Foley catheter is draining and clear.  Lab Results: No results for input(s): HGB, HCT in the last 72 hours.  Assessment/Plan: 1) Continue to monitor 2) Per orders   Saniya Tranchina C. Karsten Ro, Sanders C 07/02/2014, 3:55 PM

## 2014-07-02 NOTE — Anesthesia Postprocedure Evaluation (Signed)
  Anesthesia Post-op Note  Patient: [name]  Procedure(s) Performed: Procedure(s): PENILE PROTHESIS INFLATABLE 3 PIECE (COLOPLAST) SCROTAL APPROACH (N/A)  Patient Location: PACU  Anesthesia Type:General  Level of Consciousness: awake and alert   Airway and Oxygen Therapy: Patient Spontanous Breathing and Patient connected to nasal cannula oxygen  Post-op Pain: mild  Post-op Assessment: Post-op Vital signs reviewed and Patient's Cardiovascular Status Stable  Post-op Vital Signs: Reviewed and stable  Last Vitals:  Filed Vitals:   07/02/14 0930  BP: 128/75  Pulse: 99  Temp: 36.7 C  Resp: 18    Complications: No apparent anesthesia complications

## 2014-07-03 ENCOUNTER — Encounter (HOSPITAL_COMMUNITY): Payer: Self-pay | Admitting: Urology

## 2014-07-03 DIAGNOSIS — E119 Type 2 diabetes mellitus without complications: Secondary | ICD-10-CM | POA: Diagnosis not present

## 2014-07-03 DIAGNOSIS — I4891 Unspecified atrial fibrillation: Secondary | ICD-10-CM | POA: Diagnosis not present

## 2014-07-03 DIAGNOSIS — F329 Major depressive disorder, single episode, unspecified: Secondary | ICD-10-CM | POA: Diagnosis not present

## 2014-07-03 DIAGNOSIS — J45909 Unspecified asthma, uncomplicated: Secondary | ICD-10-CM | POA: Diagnosis not present

## 2014-07-03 DIAGNOSIS — J449 Chronic obstructive pulmonary disease, unspecified: Secondary | ICD-10-CM | POA: Diagnosis not present

## 2014-07-03 DIAGNOSIS — I10 Essential (primary) hypertension: Secondary | ICD-10-CM | POA: Diagnosis not present

## 2014-07-03 MED ORDER — CEPHALEXIN 500 MG PO CAPS: 500.0000 mg | ORAL_CAPSULE | Freq: Two times a day (BID) | ORAL | Status: DC

## 2014-07-03 MED ORDER — HYDROCODONE-ACETAMINOPHEN 5-325 MG PO TABS: 1.0000 | ORAL_TABLET | Freq: Four times a day (QID) | ORAL | Status: DC | PRN

## 2014-07-03 NOTE — Discharge Instructions (Signed)
Penile prosthesis postoperative instructions  Wound:  In most cases your incision will have absorbable sutures that will dissolve within the first 10-20 days. Some will fall out even earlier. Expect some redness as the sutures dissolved but this should occur only around the sutures. If there is generalized redness, especially with increasing pain or swelling, let us know. The scrotum and penis will very likely get "black and blue" as the blood in the tissues spread. Sometimes the whole scrotum will turn colors. The black and blue is followed by a yellow and brown color. In time, all the discoloration will go away. In some cases some firm swelling in the area of the testicle and pump may persist for up to 4-6 weeks after the surgery and is considered normal in most cases.  Diet:  You may return to your normal diet within 24 hours following your surgery. You may note some mild nausea and possibly vomiting the first 6-8 hours following surgery. This is usually due to the side effects of anesthesia, and will disappear quite soon. I would suggest clear liquids and a very light meal the first evening following your surgery.  Activity:  Your physical activity should be restricted the first 48 hours. During that time you should remain relatively inactive, moving about only when necessary. During the first 7-10 days following surgery he should avoid lifting any heavy objects (anything greater than 15 pounds), and avoid strenuous exercise. If you work, ask [place] specifically about your restrictions, both for work and home. We will write a note to your employer if needed.  You should plan to wear a tight pair of jockey shorts or an athletic supporter for the first 4-5 days, even to sleep. This will keep the scrotum immobilized to some degree and keep the swelling down.The position of your penis will determine what is most comfortable but I strongly urge you to keep the penis in the "up" position (toward your  head).  Ice packs should be placed on and off over the scrotum for the first 48 hours. Frozen peas or corn in a ZipLock bag can be frozen, used and re-frozen. Fifteen minutes on and 15 minutes off is a reasonable schedule. The ice is a good pain reliever and keeps the swelling down.  Hygiene:  You may shower 48 hours after your surgery. Tub bathing should be restricted until the seventh day.  Medication:  You will be sent home with some type of pain medication. In many cases you will be sent home with a narcotic pain pill (hydrocodone or oxycodone). If the pain is not too bad, you may take either Tylenol (acetaminophen) or Advil (ibuprofen) which contain no narcotic agents, and might be tolerated a little better, with fewer side effects. If the pain medication you are sent home with does not control the pain, you will have to let us know. Some narcotic pain medications cannot be given or refilled by a phone call to a pharmacy.  Restart Coumadin on Friday 07/06/14.  Problems you should report to [place]:   Fever of 101.0 degrees Fahrenheit or greater.  Moderate or severe swelling under the skin incision or involving the scrotum. Drug reaction such as hives, a rash, nausea or vomiting.

## 2014-07-03 NOTE — Care Management Note (Signed)
Case Management Note  Patient Details  Name: [name] MRN: [id number] Date of Birth: [birth year]  Subjective/Objective:    74 y/o m admitted w/erectile dysfunction.   From home.             Action/Plan:d/c home no needs or orders.   Expected Discharge Date:                  Expected Discharge Plan:  Home/Self Care  In-House Referral:     Discharge planning Services  CM Consult  Post Acute Care Choice:    Choice offered to:     DME Arranged:    DME Agency:     HH Arranged:    HH Agency:     Status of Service:  In process, will continue to follow  Medicare Important Message Given:    Date Medicare IM Given:    Medicare IM give by:    Date Additional Medicare IM Given:    Additional Medicare Important Message give by:     If discussed at [place] of Stay Meetings, dates discussed:    Additional Comments:  Dessa Phi, RN 07/03/2014, 10:30 AM

## 2014-07-03 NOTE — Discharge Summary (Signed)
Date of admission: 07/02/2014  Date of discharge: 07/03/2014  Admission diagnosis: Erectile Dysfunction  Discharge diagnosis: Same  History and Physical: For full details, please see admission history and physical. Briefly, [name] is a 74 y.o. year old patient with erectile dysfunction desiring three piece inflatable penile implant.   Hospital Course: Patient underwent placement of three piece IPP on 07/02/14.  He tolerated the procedure well without apparent complication.  By POD#1, he was ambulating well and pain was well controlled.  Incisions well approximated without any significant swelling or concern for hematoma.  Had met all criteria for discharge and was discharged home.  PE: Lying in bed in NAD Breathing comfortably on room air Regular rate Incsions well approximated, no erythema, no concern for hematoma, no significant swelling or echymosis  Laboratory values: No results for input(s): HGB, HCT in the last 72 hours. No results for input(s): CREATININE in the last 72 hours.  Disposition: Home  Discharge instruction: The patient was instructed to be ambulatory but told to refrain from heavy lifting, strenuous activity, or driving.  Discharge medications:    Medication List    TAKE these medications        acetaminophen 500 MG tablet  Commonly known as:  TYLENOL  Take 1,000 mg by mouth every 6 (six) hours as needed for moderate pain or headache.     atorvastatin 80 MG tablet  Commonly known as:  LIPITOR  Take 1 tablet by mouth at  bedtime     cephALEXin 500 MG capsule  Commonly known as:  KEFLEX  Take 1 capsule (500 mg total) by mouth 2 (two) times daily.     FLUoxetine 20 MG capsule  Commonly known as:  PROZAC  Take 1 capsule by mouth  daily     FLUoxetine 40 MG capsule  Commonly known as:  PROZAC  TAKE 1 CAPSULE (40 MG TOTAL) BY MOUTH DAILY.     furosemide 20 MG tablet  Commonly known as:  LASIX  Take 1 to 2 tablets by  mouth daily     gabapentin 300 MG capsule  Commonly known as:  NEURONTIN  Take 300 mg by mouth at bedtime.     HYDROcodone-acetaminophen 5-325 MG per tablet  Commonly known as:  NORCO  Take 1 tablet by mouth every 6 (six) hours as needed for moderate pain.     lisinopril 2.5 MG tablet  Commonly known as:  PRINIVIL,ZESTRIL  Take 2.5 mg by mouth every morning.     metFORMIN 500 MG tablet  Commonly known as:  GLUCOPHAGE  Take 1 tablet by mouth  twice a day     nystatin cream  Commonly known as:  MYCOSTATIN  Apply 1 application topically 2 (two) times daily as needed.     omeprazole 40 MG capsule  Commonly known as:  PRILOSEC  Take 1 capsule by mouth  daily     OPCON-A OP  Apply 1-2 drops to eye daily as needed (dry eyes.).     PRESCRIPTION MEDICATION  Apply 1 application topically at bedtime. Compound with cetaphil for dry skin.     primidone 50 MG tablet  Commonly known as:  MYSOLINE  Take 2 tablets in the morning, 1 tablet in the evening     silodosin 8 MG Caps capsule  Commonly known as:  RAPAFLO  Take 1 capsule by mouth every morning.     silodosin 8 MG Caps capsule  Commonly known as:  RAPAFLO  Take 1 capsule (8  mg total) by mouth daily with breakfast.     warfarin 2.5 MG tablet  Commonly known as:  COUMADIN  Take 1 to 1.5 tablets by mouth daily or as directed by coumadin clinic     zolpidem 10 MG tablet  Commonly known as:  AMBIEN  TAKE 1/2-1 TABLET BY MOUTH AT BEDTIME        Followup:      Follow-up Information    Follow up with Claybon Jabs, MD On 07/10/2014.   Specialty:  Urology   Why:  at 10:30   Contact information:   [address] [place] [address] [phone]

## 2014-07-03 NOTE — Progress Notes (Signed)
Pt c/o being unable to void, despite feeling the urge. Becoming progressively more uncomfortable. Bladder scan >291ml. T/C to Dr. Simone Curia office. Verbal order for placement of Foley catheter obtained from Dr. Karsten Ro.

## 2014-07-11 ENCOUNTER — Other Ambulatory Visit: Payer: Self-pay | Admitting: Neurology

## 2014-07-12 NOTE — Telephone Encounter (Signed)
Primidone refill requested. Per last office note- patient to remain on medication. Refill approved and sent to patient's pharmacy.   

## 2014-07-16 DIAGNOSIS — N508 Other specified disorders of male genital organs: Secondary | ICD-10-CM | POA: Diagnosis not present

## 2014-07-16 DIAGNOSIS — N390 Urinary tract infection, site not specified: Secondary | ICD-10-CM | POA: Diagnosis not present

## 2014-07-18 ENCOUNTER — Ambulatory Visit (INDEPENDENT_AMBULATORY_CARE_PROVIDER_SITE_OTHER): Payer: Medicare Other

## 2014-07-18 DIAGNOSIS — I824Y9 Acute embolism and thrombosis of unspecified deep veins of unspecified proximal lower extremity: Secondary | ICD-10-CM

## 2014-07-18 DIAGNOSIS — Z5181 Encounter for therapeutic drug level monitoring: Secondary | ICD-10-CM

## 2014-07-18 DIAGNOSIS — I4891 Unspecified atrial fibrillation: Secondary | ICD-10-CM

## 2014-07-18 DIAGNOSIS — Z7901 Long term (current) use of anticoagulants: Secondary | ICD-10-CM | POA: Diagnosis not present

## 2014-07-18 LAB — POCT INR: INR: 1.9

## 2014-07-23 DIAGNOSIS — N529 Male erectile dysfunction, unspecified: Secondary | ICD-10-CM | POA: Diagnosis not present

## 2014-07-28 ENCOUNTER — Encounter: Payer: Self-pay | Admitting: Family Medicine

## 2014-07-28 ENCOUNTER — Other Ambulatory Visit: Payer: Self-pay | Admitting: Family Medicine

## 2014-07-28 ENCOUNTER — Ambulatory Visit (INDEPENDENT_AMBULATORY_CARE_PROVIDER_SITE_OTHER): Payer: Medicare Other | Admitting: Family Medicine

## 2014-07-28 VITALS — BP 92/64 | HR 77 | Temp 98.0°F | Ht 73.5 in | Wt 186.8 lb

## 2014-07-28 DIAGNOSIS — K219 Gastro-esophageal reflux disease without esophagitis: Secondary | ICD-10-CM

## 2014-07-28 MED ORDER — PANTOPRAZOLE SODIUM 40 MG PO TBEC
40.0000 mg | DELAYED_RELEASE_TABLET | Freq: Every day | ORAL | Status: DC
Start: 2014-07-28 — End: 2014-08-09

## 2014-07-28 MED ORDER — SUCRALFATE 1 GM/10ML PO SUSP: 1.0000 g | Freq: Three times a day (TID) | ORAL | Status: DC

## 2014-07-28 NOTE — Progress Notes (Signed)
Pre visit review using our clinic review tool, if applicable. No additional management support is needed unless otherwise documented below in the visit note. 

## 2014-07-28 NOTE — Progress Notes (Signed)
   Subjective:    Patient ID: [name], male    DOB: May 25, 1940, 74 y.o.   MRN: [id number]  HPI   74 year old male well known to me with history of dysphagia, GERD gastric bypass,  afib and DM presents with with worsening onset heartburn in last 8 days. Burning in chest ,constant, worse if try to eat something. No exertion change, no cough, no SOB. exercising well.  No new diet changes, no acid foods trigger, started after drinking water. Has been drinking a lot of ensure. Started probiotic, helps some.   On omeprazole 40 mg daily. Has always worked in past.  GI Dr. Sharlett Iles.  ENDO: 2004 duodentitis with hemmorhage.  Not on aspirin or ibuprofen, no new meds.   Wt Readings from Last 3 Encounters:  07/28/14 186 lb 12 oz (84.709 kg)  07/02/14 196 lb (88.905 kg)  06/28/14 196 lb (88.905 kg)       Review of Systems  Constitutional: Negative for fever and fatigue.  HENT: Negative for ear pain.   Eyes: Negative for pain.  Respiratory: Negative for shortness of breath.   Cardiovascular: Positive for chest pain. Negative for leg swelling.       Objective:   Physical Exam  Constitutional: Vital signs are normal. He appears well-developed and well-nourished.  HENT:  Head: Normocephalic.  Right Ear: Hearing normal.  Left Ear: Hearing normal.  Nose: Nose normal.  Mouth/Throat: Oropharynx is clear and moist and mucous membranes are normal.  Neck: Trachea normal. Carotid bruit is not present. No thyroid mass and no thyromegaly present.  Cardiovascular: Normal rate, regular rhythm and normal pulses.  Exam reveals no gallop, no distant heart sounds and no friction rub.   No murmur heard. No peripheral edema  Pulmonary/Chest: Effort normal and breath sounds normal. No respiratory distress.  Abdominal: There is tenderness in the epigastric area.  Skin: Skin is warm, dry and intact. No rash noted.  Psychiatric: He has a normal mood and affect. His speech is normal and behavior  is normal. Thought content normal.          Assessment & Plan:

## 2014-07-28 NOTE — Patient Instructions (Signed)
Stop omeprazole.  Change to pantoprazole 40 mg daily.  Start carafate to coat the esophagus.  Avoid spicy foods, citris, tomatos, chocolate, alcohol, soda, caffeine.  Call if not improving as expected.

## 2014-07-30 NOTE — Telephone Encounter (Signed)
Received refill request electronically from pharmacy. Last refill 06/24/14 #30, last office visit 07/28/14 Is it okay to refill medication?

## 2014-07-31 ENCOUNTER — Telehealth: Payer: Self-pay

## 2014-07-31 NOTE — Telephone Encounter (Signed)
I contact the [name] and advised his A1C blood test is due. [name] states he has not been feeling well due to his heart burn. [name] is going to [facility]'s office once he gets to feeling better to schedule lab work.

## 2014-07-31 NOTE — Telephone Encounter (Signed)
Continue current meds but can add ranitidine OTC twice daily for additional symptoms. I recommend we go ahead and get him back to GI   ASAP.  He needs to call Dr. Buel Ream office to set up new GI MD there. I don't belive he needs new referral.  If appt with GI not able to be in next week.. Then have him come in for labs here to check lipase, CMET, cbc, hpylori stool.

## 2014-07-31 NOTE — Telephone Encounter (Signed)
Pt was seen [facility] clinic on 07/28/14; pt has been taking pantoprazole and carafate as instructed; pt has been watching diet and was feeling slightly better until last night; pt has heartburn from throat to stomach since last night. No SOB, abd pain, cough or N&V. Pt wants to know if there is different med can try and request cb. [facility].Please advise. If condition changes or worsens prior to cb pt will call [facility].

## 2014-07-31 NOTE — Telephone Encounter (Signed)
Mrs Breau left v/m that pt is with her and can be reached at [phone] if needed.

## 2014-07-31 NOTE — Telephone Encounter (Signed)
Called pt and no answer and no voicemail (kept ringing) 

## 2014-07-31 NOTE — Telephone Encounter (Signed)
Rx called in as prescribed 

## 2014-08-01 ENCOUNTER — Other Ambulatory Visit: Payer: Self-pay | Admitting: Family Medicine

## 2014-08-01 ENCOUNTER — Encounter: Payer: Self-pay | Admitting: Gastroenterology

## 2014-08-01 ENCOUNTER — Telehealth: Payer: Self-pay | Admitting: Family Medicine

## 2014-08-01 DIAGNOSIS — R1010 Upper abdominal pain, unspecified: Secondary | ICD-10-CM

## 2014-08-01 NOTE — Telephone Encounter (Signed)
Patients wife called [facility] and would be August for a New patient appt with a MD. Dr Lorelei Pont put a GI Referral in for [place] to request an urgent P.A. visit. Appt is for next Wednesday, June 29th at 1:45pm with Alonza Bogus.

## 2014-08-01 NOTE — Telephone Encounter (Signed)
Spoke to [name] and she got pt a GI appt next week and will call him directly

## 2014-08-02 ENCOUNTER — Other Ambulatory Visit: Payer: Self-pay | Admitting: Family Medicine

## 2014-08-03 DIAGNOSIS — K219 Gastro-esophageal reflux disease without esophagitis: Secondary | ICD-10-CM | POA: Insufficient documentation

## 2014-08-03 NOTE — Assessment & Plan Note (Signed)
Severe symptoms despite  Omeprazole 40 mgf daily. Change to pantoprazole and start carafate to coat esophagus. If not improving needs to set up appt with GI for likely endoscopy.  GI Dr. Sharlett Iles.  ENDO: 2004 duodentitis with hemmorhage.  Not on aspirin or ibuprofen, no new meds.

## 2014-08-03 NOTE — Telephone Encounter (Signed)
Omeprazole is not on current med list.  I do not see it in the history either, only pantoprazole.  Okay to fill?

## 2014-08-06 ENCOUNTER — Other Ambulatory Visit: Payer: Self-pay

## 2014-08-06 ENCOUNTER — Other Ambulatory Visit: Payer: Self-pay | Admitting: *Deleted

## 2014-08-06 MED ORDER — FLUOXETINE HCL 40 MG PO CAPS: ORAL_CAPSULE | ORAL | Status: DC

## 2014-08-06 MED ORDER — METFORMIN HCL 500 MG PO TABS: ORAL_TABLET | ORAL | Status: DC

## 2014-08-07 ENCOUNTER — Other Ambulatory Visit: Payer: Self-pay | Admitting: *Deleted

## 2014-08-07 MED ORDER — WARFARIN SODIUM 2.5 MG PO TABS: ORAL_TABLET | ORAL | Status: DC

## 2014-08-07 MED ORDER — FLUOXETINE HCL 40 MG PO CAPS: ORAL_CAPSULE | ORAL | Status: DC

## 2014-08-07 NOTE — Telephone Encounter (Signed)
Refill sent to pharmacy as requested

## 2014-08-07 NOTE — Telephone Encounter (Signed)
I think you guys fill his warfarin.

## 2014-08-08 ENCOUNTER — Ambulatory Visit (INDEPENDENT_AMBULATORY_CARE_PROVIDER_SITE_OTHER): Payer: Medicare Other | Admitting: Gastroenterology

## 2014-08-08 ENCOUNTER — Other Ambulatory Visit (INDEPENDENT_AMBULATORY_CARE_PROVIDER_SITE_OTHER): Payer: Medicare Other

## 2014-08-08 ENCOUNTER — Ambulatory Visit (INDEPENDENT_AMBULATORY_CARE_PROVIDER_SITE_OTHER)
Admission: RE | Admit: 2014-08-08 | Discharge: 2014-08-08 | Disposition: A | Discharge status: Home / Self Care | Payer: Medicare Other | Source: Ambulatory Visit | Attending: Gastroenterology | Admitting: Gastroenterology

## 2014-08-08 ENCOUNTER — Encounter: Payer: Self-pay | Admitting: Gastroenterology

## 2014-08-08 VITALS — BP 86/66 | HR 81 | Ht 73.5 in | Wt 182.0 lb

## 2014-08-08 DIAGNOSIS — R634 Abnormal weight loss: Secondary | ICD-10-CM | POA: Diagnosis not present

## 2014-08-08 DIAGNOSIS — K828 Other specified diseases of gallbladder: Secondary | ICD-10-CM | POA: Diagnosis not present

## 2014-08-08 DIAGNOSIS — R112 Nausea with vomiting, unspecified: Secondary | ICD-10-CM

## 2014-08-08 DIAGNOSIS — Z9884 Bariatric surgery status: Secondary | ICD-10-CM | POA: Diagnosis not present

## 2014-08-08 DIAGNOSIS — R1033 Periumbilical pain: Secondary | ICD-10-CM | POA: Insufficient documentation

## 2014-08-08 DIAGNOSIS — R1013 Epigastric pain: Secondary | ICD-10-CM | POA: Insufficient documentation

## 2014-08-08 DIAGNOSIS — N529 Male erectile dysfunction, unspecified: Secondary | ICD-10-CM | POA: Diagnosis not present

## 2014-08-08 LAB — CBC WITH DIFFERENTIAL/PLATELET
Basophils Absolute: 0.1 10*3/uL (ref 0.0–0.1)
Basophils Relative: 0.8 % (ref 0.0–3.0)
Eosinophils Absolute: 0.3 10*3/uL (ref 0.0–0.7)
Eosinophils Relative: 3.8 % (ref 0.0–5.0)
HCT: 39 % (ref 39.0–52.0)
Hemoglobin: 12.9 g/dL — ABNORMAL LOW (ref 13.0–17.0)
LYMPHS ABS: 2.6 10*3/uL (ref 0.7–4.0)
LYMPHS PCT: 30.2 % (ref 12.0–46.0)
MCHC: 33.1 g/dL (ref 30.0–36.0)
MCV: 94 fl (ref 78.0–100.0)
MONOS PCT: 9.6 % (ref 3.0–12.0)
Monocytes Absolute: 0.8 10*3/uL (ref 0.1–1.0)
Neutro Abs: 4.8 10*3/uL (ref 1.4–7.7)
Neutrophils Relative %: 55.6 % (ref 43.0–77.0)
PLATELETS: 194 10*3/uL (ref 150.0–400.0)
RBC: 4.15 Mil/uL — ABNORMAL LOW (ref 4.22–5.81)
RDW: 14.9 % (ref 11.5–15.5)
WBC: 8.6 10*3/uL (ref 4.0–10.5)

## 2014-08-08 LAB — COMPREHENSIVE METABOLIC PANEL
ALK PHOS: 64 U/L (ref 39–117)
ALT: 24 U/L (ref 0–53)
AST: 25 U/L (ref 0–37)
Albumin: 4.5 g/dL (ref 3.5–5.2)
BUN: 21 mg/dL (ref 6–23)
CO2: 23 mEq/L (ref 19–32)
Calcium: 9.4 mg/dL (ref 8.4–10.5)
Chloride: 103 mEq/L (ref 96–112)
Creatinine, Ser: 1.5 mg/dL (ref 0.40–1.50)
GFR: 48.79 mL/min — ABNORMAL LOW (ref >60.00)
Glucose, Bld: 103 mg/dL — ABNORMAL HIGH (ref 70–99)
POTASSIUM: 3.7 meq/L (ref 3.5–5.1)
SODIUM: 138 meq/L (ref 135–145)
Total Bilirubin: 0.4 mg/dL (ref 0.2–1.2)
Total Protein: 7.4 g/dL (ref 6.0–8.3)

## 2014-08-08 LAB — AMYLASE: Amylase: 53 U/L (ref 27–131)

## 2014-08-08 LAB — LIPASE: Lipase: 30 U/L (ref 11.0–59.0)

## 2014-08-08 MED ORDER — IOHEXOL 300 MG/ML  SOLN
100.0000 mL | Freq: Once | INTRAMUSCULAR | Status: AC | PRN
  Administered 2014-08-08: 100 mL via INTRAVENOUS

## 2014-08-08 NOTE — Patient Instructions (Signed)
You have been scheduled for a CT scan of the abdomen and pelvis at [facility] ([address]---this is in the same building as [facility]).   You are scheduled on 08-08-14 at 4:00pm. You should arrive 15 minutes prior to your appointment time for registration. Please follow the written instructions below on the day of your exam:  WARNING: IF YOU ARE ALLERGIC TO IODINE/X-RAY DYE, PLEASE NOTIFY RADIOLOGY IMMEDIATELY AT [phone]! YOU WILL BE GIVEN A 13 HOUR PREMEDICATION PREP.  1) Do not eat or drink anything after  (4 hours prior to your test) 2) You have been given 2 bottles of oral contrast to drink. The solution may taste               better if refrigerated, but do NOT add ice or any other liquid to this solution. Shake             well before drinking.    Drink 1 bottle of contrast @ now (2 hours prior to your exam)  Drink 1 bottle of contrast @ 3:30pm (1 hour prior to your exam)  You may take any medications as prescribed with a small amount of water except for the following: Metformin, Glucophage, Glucovance, Avandamet, Riomet, Fortamet, Actoplus Met, Janumet, Glumetza or Metaglip. The above medications must be held the day of the exam AND 48 hours after the exam.  The purpose of you drinking the oral contrast is to aid in the visualization of your intestinal tract. The contrast solution may cause some diarrhea. Before your exam is started, you will be given a small amount of fluid to drink. Depending on your individual set of symptoms, you may also receive an intravenous injection of x-ray contrast/dye. Plan on being at [facility] for 30 minutes or long, depending on the type of exam you are having performed.  This test typically takes 30-45 minutes to complete.  If you have any questions regarding your exam or if you need to reschedule, you may call the CT department at [phone] between the hours of 8:00 am and 5:00 pm,  Monday-Friday.  ________________________________________________________________________   Your physician has requested that you go to the basement for lab work before leaving today

## 2014-08-08 NOTE — Progress Notes (Signed)
08/08/2014 KIA STAVROS [id number] 06-04-1940   HISTORY OF PRESENT ILLNESS:  This is a 74 year old male who is previously known to Dr. Sharlett Iles last seen in 2012.  Was sent here by his PCP, Dr. Diona Browner, on this occasion for evaluation of recent GI complaints. Has long-standing reflux issues for which he was previously taking omeprazole.  He says that he was doing well until about 3 weeks ago when he started having what felt like severe heartburn for 12 days straight.  He was seen by his PCP and the omeprazole was discontinued and he was started on pantoprazole 40 mg daily and carafate suspension four times per day.  These really has not helped much and for the past 16 days or so he has felt terrible with significant epigastric/mid-abdominal pain and nausea.  Had some episodes of vomiting as well.  Has not been able to eat or drink much at all because of the pain.  Pain shoots straight through to his back.  Has lost 17 pounds in less than 3 weeks reportedly.  Said he's never had similar symptoms to this in the past that have been this severe or persistent.  Last EGD was in 03/2002 with duodenitis.     Past Medical History  Diagnosis Date  . Diverticulosis of colon (without mention of hemorrhage)   . Restless legs syndrome (RLS)   . Anxiety state, unspecified   . Hx of gout     but doesn't take any meds  . Bruises easily     pt is on Coumadin  . Cancer     SKIN CANCER REMOVED  . Unspecified essential hypertension     takes Lisinopril daily  . Depressive disorder, not elsewhere classified     takes Prozac daily  . History of blood clots     behind right knee and then went into left lung 43yrs ago  . Peripheral edema     takes Furosemide daily  . Unspecified asthma(493.90)     as a child  . Pneumonia     last time about 29yrs ago  . Arthritis   . Joint pain   . History of colon polyps   . History of kidney stones   . Hard of hearing     wears hearing aids  . Dysrhythmia       Atrial Fibrillation  . Peripheral vascular disease   . Pulmonary embolism     over 10 yrs ago "many years ago"  . ED (erectile dysfunction)   . Hx of diabetes mellitus     "no longer diabetic since gastric bypass" -  . Diabetes mellitus without complication    Past Surgical History  Procedure Laterality Date  . Knee surgery Right     x 4  . Artery repair      Left forearm  . Foot surgery      Left foot   . Replacement total knee Left     x 2  . Breath tek h pylori  01/08/2011    Procedure: BREATH TEK H PYLORI;  Surgeon: Pedro Earls, MD;  Location: [facility];  Service: General;  Laterality: N/A;  . Hand surgery      LEFT  . Leg surgery      FOR NECROTIZING FASCITIS L LEG AND GROIN  . 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 Shoulder arthroscopy    . Shoulder surgery Left 2014  . Back surgery      x 3  . Knee surgery Left     arthroscopy  . Eye surgery      cataract bil  . Injections in back      x 18  . Colonoscopy    . Cystoscopy    . Total shoulder arthroplasty Left 01/26/2013    Procedure: TOTAL SHOULDER ARTHROPLASTY;  Surgeon: Nita Sells, MD;  Location: [facility];  Service: Orthopedics;  Laterality: Left;  Left total shoulder arthroplasty  . Anterior cervical decomp/discectomy fusion N/A 05/09/2013    Procedure: ANTERIOR CERVICAL DECOMPRESSION/DISCECTOMY FUSION CERVICAL THREE-FOUR,CERVICAL FOUR-FIVE,CERVICAL FIVE-SIX;  Surgeon: Floyce Stakes, MD;  Location: [facility];  Service: Neurosurgery;  Laterality: N/A;  . Penile prosthesis implant N/A 07/02/2014    Procedure: PENILE PROTHESIS INFLATABLE 3 PIECE (COLOPLAST) SCROTAL APPROACH;  Surgeon: Kathie Rhodes, MD;  Location: [facility];  Service: Urology;  Laterality: N/A;    reports that he has never smoked. He has never used smokeless tobacco. He  reports that he does not drink alcohol or use illicit drugs. family history includes Alzheimer's disease in his sister; Breast cancer in his mother; Cancer in his mother; Colon cancer (age of onset: 36) in his brother; Emphysema in his father; Heart attack in his brother; Prostate cancer in his brother; Uterine cancer in his mother. Allergies  Allergen Reactions  . Sulfacetamide Sodium     REACTION: Throat swelling  . Adhesive [Tape]     Tears skin-- "No tape of any kind, they all tear skin right off"      Outpatient Encounter Prescriptions as of 08/08/2014  Medication Sig  . acetaminophen (TYLENOL) 500 MG tablet Take 1,000 mg by mouth every 6 (six) hours as needed for moderate pain or headache.  [name] atorvastatin (LIPITOR) 80 MG tablet Take 1 tablet by mouth at  bedtime  . FLUoxetine (PROZAC) 40 MG capsule TAKE 1 CAPSULE (40 MG TOTAL) BY MOUTH DAILY.  . furosemide (LASIX) 20 MG tablet Take 1 to 2 tablets by  mouth daily  . gabapentin (NEURONTIN) 300 MG capsule Take 300 mg by mouth at bedtime.   [name] lisinopril (PRINIVIL,ZESTRIL) 2.5 MG tablet Take 2.5 mg by mouth every morning.   . metFORMIN (GLUCOPHAGE) 500 MG tablet Take 1 tablet by mouth  twice a day  . omeprazole (PRILOSEC) 40 MG capsule Take 1 capsule by mouth  daily  . pantoprazole (PROTONIX) 40 MG tablet Take 1 tablet (40 mg total) by mouth daily.  [name] PRESCRIPTION MEDICATION Apply 1 application topically at bedtime. Compound with cetaphil for dry skin.  [name] primidone (MYSOLINE) 50 MG tablet TAKE 2 TABLETS BY MOUTH IN THE MORNING, 1 TABLET IN THE EVENING  . silodosin (RAPAFLO) 8 MG CAPS capsule Take 1 capsule by mouth every morning.  . sucralfate (CARAFATE) 1 GM/10ML suspension Take 10 mLs (1 g total) by mouth 4 (four) times daily -  with meals and at bedtime.  [name] warfarin (COUMADIN) 2.5 MG tablet Take 1 to 1.5 tablets by mouth daily or as directed by coumadin clinic  . zolpidem (AMBIEN) 10 MG tablet TAKE 1/2 - 1 TABLET BY MOUTH AT BEDTIME    No facility-administered encounter medications on file as of 08/08/2014.     REVIEW OF SYSTEMS  : All other systems reviewed and negative except where noted in the History of Present Illness.   PHYSICAL EXAM:  BP 86/66 mmHg  Pulse 81  Ht 6' 1.5" (1.867 m)  Wt 182 lb (82.555 kg)  BMI 23.68 kg/m2 General: Well developed white male in no acute distress, but uncomfortable. Head: Normocephalic and atraumatic Eyes:  Sclerae anicteric, conjunctiva pink. Ears: Normal auditory acuity Lungs: Clear throughout to auscultation Heart: Regular rate and rhythm Abdomen: Soft, non-distended.  Bowel sounds present.  Moderate epigastric and mid-abdominal TTP without R/R/G. Musculoskeletal: Symmetrical with no gross deformities  Skin: No lesions on visible extremities Extremities: No edema  Neurological: Alert oriented x 4, grossly non-focal Psychological:  Alert and cooperative. Normal mood and affect  ASSESSMENT AND PLAN: -Epigastric/mid-abdominal pain, nausea, vomiting, and weight loss (17 pounds in less than 3 weeks):  Started feeling poorly 3 weeks ago, but much worse over the past 7 days.  I'm not convinced that this is reflux related and want to rule out pancreatitis, etc.  Will check CBC, CMP, amylase, and lipase.  Will check CT scan abdomen and pelvis with contrast. I did offer him anti-emetic and pain medication, however, he declined.    CC:  Jinny Sanders, MD

## 2014-08-08 NOTE — Progress Notes (Signed)
Reviewed and agree with management plan.  Almon Whitford T. Linzi Ohlinger, MD FACG 

## 2014-08-09 ENCOUNTER — Other Ambulatory Visit: Payer: Self-pay | Admitting: *Deleted

## 2014-08-09 DIAGNOSIS — R109 Unspecified abdominal pain: Secondary | ICD-10-CM

## 2014-08-09 MED ORDER — PANTOPRAZOLE SODIUM 40 MG PO TBEC: 40.0000 mg | DELAYED_RELEASE_TABLET | Freq: Two times a day (BID) | ORAL | Status: DC

## 2014-08-10 ENCOUNTER — Ambulatory Visit (INDEPENDENT_AMBULATORY_CARE_PROVIDER_SITE_OTHER): Payer: Medicare Other

## 2014-08-10 DIAGNOSIS — I824Y9 Acute embolism and thrombosis of unspecified deep veins of unspecified proximal lower extremity: Secondary | ICD-10-CM | POA: Diagnosis not present

## 2014-08-10 DIAGNOSIS — M546 Pain in thoracic spine: Secondary | ICD-10-CM | POA: Diagnosis not present

## 2014-08-10 DIAGNOSIS — Z5181 Encounter for therapeutic drug level monitoring: Secondary | ICD-10-CM | POA: Diagnosis not present

## 2014-08-10 DIAGNOSIS — I4891 Unspecified atrial fibrillation: Secondary | ICD-10-CM

## 2014-08-10 DIAGNOSIS — Z7901 Long term (current) use of anticoagulants: Secondary | ICD-10-CM

## 2014-08-10 DIAGNOSIS — M9902 Segmental and somatic dysfunction of thoracic region: Secondary | ICD-10-CM | POA: Diagnosis not present

## 2014-08-10 LAB — POCT INR: INR: 3.8

## 2014-08-14 DIAGNOSIS — M546 Pain in thoracic spine: Secondary | ICD-10-CM | POA: Diagnosis not present

## 2014-08-14 DIAGNOSIS — M9902 Segmental and somatic dysfunction of thoracic region: Secondary | ICD-10-CM | POA: Diagnosis not present

## 2014-08-16 DIAGNOSIS — M9902 Segmental and somatic dysfunction of thoracic region: Secondary | ICD-10-CM | POA: Diagnosis not present

## 2014-08-16 DIAGNOSIS — M546 Pain in thoracic spine: Secondary | ICD-10-CM | POA: Diagnosis not present

## 2014-08-20 DIAGNOSIS — M9902 Segmental and somatic dysfunction of thoracic region: Secondary | ICD-10-CM | POA: Diagnosis not present

## 2014-08-20 DIAGNOSIS — M546 Pain in thoracic spine: Secondary | ICD-10-CM | POA: Diagnosis not present

## 2014-08-21 DIAGNOSIS — M898X9 Other specified disorders of bone, unspecified site: Secondary | ICD-10-CM | POA: Diagnosis not present

## 2014-08-21 DIAGNOSIS — M7750 Other enthesopathy of unspecified foot: Secondary | ICD-10-CM | POA: Diagnosis not present

## 2014-08-22 DIAGNOSIS — M9902 Segmental and somatic dysfunction of thoracic region: Secondary | ICD-10-CM | POA: Diagnosis not present

## 2014-08-22 DIAGNOSIS — M546 Pain in thoracic spine: Secondary | ICD-10-CM | POA: Diagnosis not present

## 2014-08-24 ENCOUNTER — Encounter (HOSPITAL_COMMUNITY)
Admission: RE | Admit: 2014-08-24 | Discharge: 2014-08-24 | Disposition: A | Discharge status: Home / Self Care | Payer: Medicare Other | Source: Ambulatory Visit | Attending: Gastroenterology | Admitting: Gastroenterology

## 2014-08-24 ENCOUNTER — Encounter: Payer: Self-pay | Admitting: *Deleted

## 2014-08-24 ENCOUNTER — Encounter (HOSPITAL_COMMUNITY): Payer: Self-pay

## 2014-08-24 DIAGNOSIS — R109 Unspecified abdominal pain: Secondary | ICD-10-CM | POA: Diagnosis not present

## 2014-08-24 MED ORDER — SINCALIDE 5 MCG IJ SOLR
0.0200 ug/kg | Freq: Once | INTRAMUSCULAR | Status: AC
  Administered 2014-08-24: 1.7 ug via INTRAVENOUS

## 2014-08-24 MED ORDER — TECHNETIUM TC 99M MEBROFENIN IV KIT
5.3000 | PACK | Freq: Once | INTRAVENOUS | Status: AC | PRN
  Administered 2014-08-24: 5.3 via INTRAVENOUS

## 2014-08-27 DIAGNOSIS — M1711 Unilateral primary osteoarthritis, right knee: Secondary | ICD-10-CM | POA: Diagnosis not present

## 2014-08-28 ENCOUNTER — Telehealth: Payer: Self-pay | Admitting: *Deleted

## 2014-08-28 DIAGNOSIS — M9902 Segmental and somatic dysfunction of thoracic region: Secondary | ICD-10-CM | POA: Diagnosis not present

## 2014-08-28 DIAGNOSIS — M546 Pain in thoracic spine: Secondary | ICD-10-CM | POA: Diagnosis not present

## 2014-08-28 NOTE — Telephone Encounter (Signed)
08/24/2014    RE: [name]  DOB: 01-Dec-1940  MRN: [id number]    Dear Dr. Percival Spanish,   We have scheduled the above patient for an endoscopic procedure. Our records show that he is on anticoagulation therapy.   Please advise as to whether patient may hold Coumadin five days prior to the procedure, which is scheduled for 10/01/2014.   Please route your response to Leone Payor, RN   Sincerely,   Leone Payor

## 2014-08-29 DIAGNOSIS — M9902 Segmental and somatic dysfunction of thoracic region: Secondary | ICD-10-CM | POA: Diagnosis not present

## 2014-08-29 DIAGNOSIS — M546 Pain in thoracic spine: Secondary | ICD-10-CM | POA: Diagnosis not present

## 2014-08-30 DIAGNOSIS — R1013 Epigastric pain: Secondary | ICD-10-CM | POA: Diagnosis not present

## 2014-08-30 DIAGNOSIS — M549 Dorsalgia, unspecified: Secondary | ICD-10-CM | POA: Diagnosis not present

## 2014-08-30 DIAGNOSIS — Z9889 Other specified postprocedural states: Secondary | ICD-10-CM | POA: Diagnosis not present

## 2014-08-31 ENCOUNTER — Encounter: Payer: Self-pay | Admitting: *Deleted

## 2014-08-31 ENCOUNTER — Telehealth: Payer: Self-pay | Admitting: *Deleted

## 2014-08-31 NOTE — Telephone Encounter (Signed)
Minus Breeding, MD  Hulan Saas, RN            Expand All Collapse All    Pt with CHADS2 score of 2 (HTN, DM), per protocol okay to hold warfarin x 5 days prior to procedure. Restart same day or day after per instructions from GI.    Please note that questions that come to [place] as a staff message are not an official part of the chart.        Previous Messages     ----- Message -----   From: Hulan Saas, RN   Sent: 08/24/2014  4:55 PM    To: Minus Breeding, MD   08/24/2014    RE: [name]  DOB: 29-Jul-1940  MRN: [id number]    Dear Dr. Percival Spanish,   We have scheduled the above patient for an endoscopic procedure. Our records show that he is on anticoagulation therapy.   Please advise as to whether patient may hold Coumadin five days prior to the procedure, which is scheduled for 10/01/2014.   Please route your response to Leone Payor, RN   Sincerely,   Leone Payor

## 2014-08-31 NOTE — Telephone Encounter (Signed)
Patient has cancelled procedure.

## 2014-09-03 DIAGNOSIS — M9902 Segmental and somatic dysfunction of thoracic region: Secondary | ICD-10-CM | POA: Diagnosis not present

## 2014-09-03 DIAGNOSIS — M546 Pain in thoracic spine: Secondary | ICD-10-CM | POA: Diagnosis not present

## 2014-09-05 ENCOUNTER — Ambulatory Visit (INDEPENDENT_AMBULATORY_CARE_PROVIDER_SITE_OTHER): Payer: Medicare Other

## 2014-09-05 DIAGNOSIS — Z7901 Long term (current) use of anticoagulants: Secondary | ICD-10-CM

## 2014-09-05 DIAGNOSIS — I824Y9 Acute embolism and thrombosis of unspecified deep veins of unspecified proximal lower extremity: Secondary | ICD-10-CM | POA: Diagnosis not present

## 2014-09-05 DIAGNOSIS — I4891 Unspecified atrial fibrillation: Secondary | ICD-10-CM

## 2014-09-05 DIAGNOSIS — Z5181 Encounter for therapeutic drug level monitoring: Secondary | ICD-10-CM | POA: Diagnosis not present

## 2014-09-05 LAB — POCT INR: INR: 1.8

## 2014-09-06 ENCOUNTER — Other Ambulatory Visit: Payer: Self-pay | Admitting: Family Medicine

## 2014-09-06 DIAGNOSIS — M546 Pain in thoracic spine: Secondary | ICD-10-CM | POA: Diagnosis not present

## 2014-09-06 DIAGNOSIS — M9902 Segmental and somatic dysfunction of thoracic region: Secondary | ICD-10-CM | POA: Diagnosis not present

## 2014-09-06 NOTE — Telephone Encounter (Signed)
Last office visit 07/28/2014.  Last refilled 07/30/2014 for #30 with no refills.  Ok to refill?

## 2014-09-07 NOTE — Telephone Encounter (Signed)
Called into [facility]. 

## 2014-09-14 ENCOUNTER — Telehealth: Payer: Self-pay

## 2014-09-14 DIAGNOSIS — H04123 Dry eye syndrome of bilateral lacrimal glands: Secondary | ICD-10-CM | POA: Diagnosis not present

## 2014-09-14 NOTE — Telephone Encounter (Signed)
S/w pt regarding cardiac clearance for upcoming upper endoscopy. Pt has not had OV since 2014. Pt states he saw Dr. Percival Spanish in 2015. No OV shown in pt chart. Informed pt he needs to be seen before clearance granted. Pt agreeable to appt 09/17/14, 1:30pm with Christell Faith, PA-C Pt verbalized understanding to hold Sunday evening dose of coumadin.

## 2014-09-15 IMAGING — CR DG CERVICAL SPINE 2 OR 3 VIEWS
3 series · 3 of 3 positions shown · non-contrast
Comparison: DG CERVICAL SPINE COMPLETE dated 05/09/2013

CLINICAL DATA: post op pain and trouble swallowing

EXAM:
CERVICAL SPINE - 2-3 VIEW

[w c-spine lat]
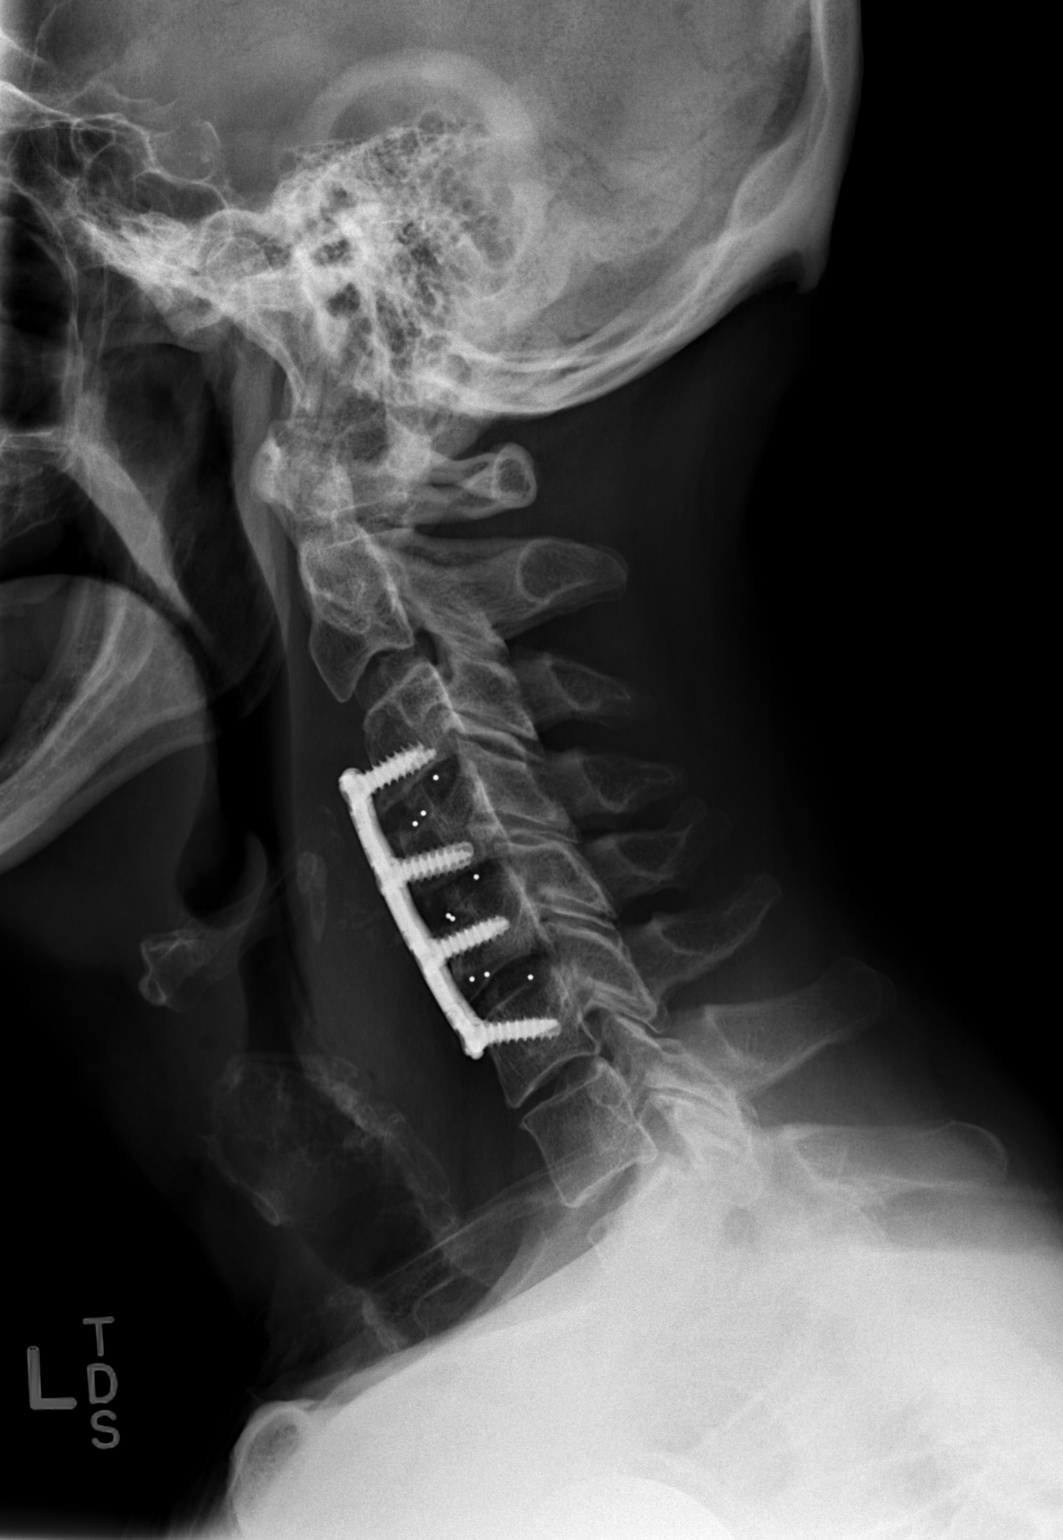

[w c-spine a.p.]
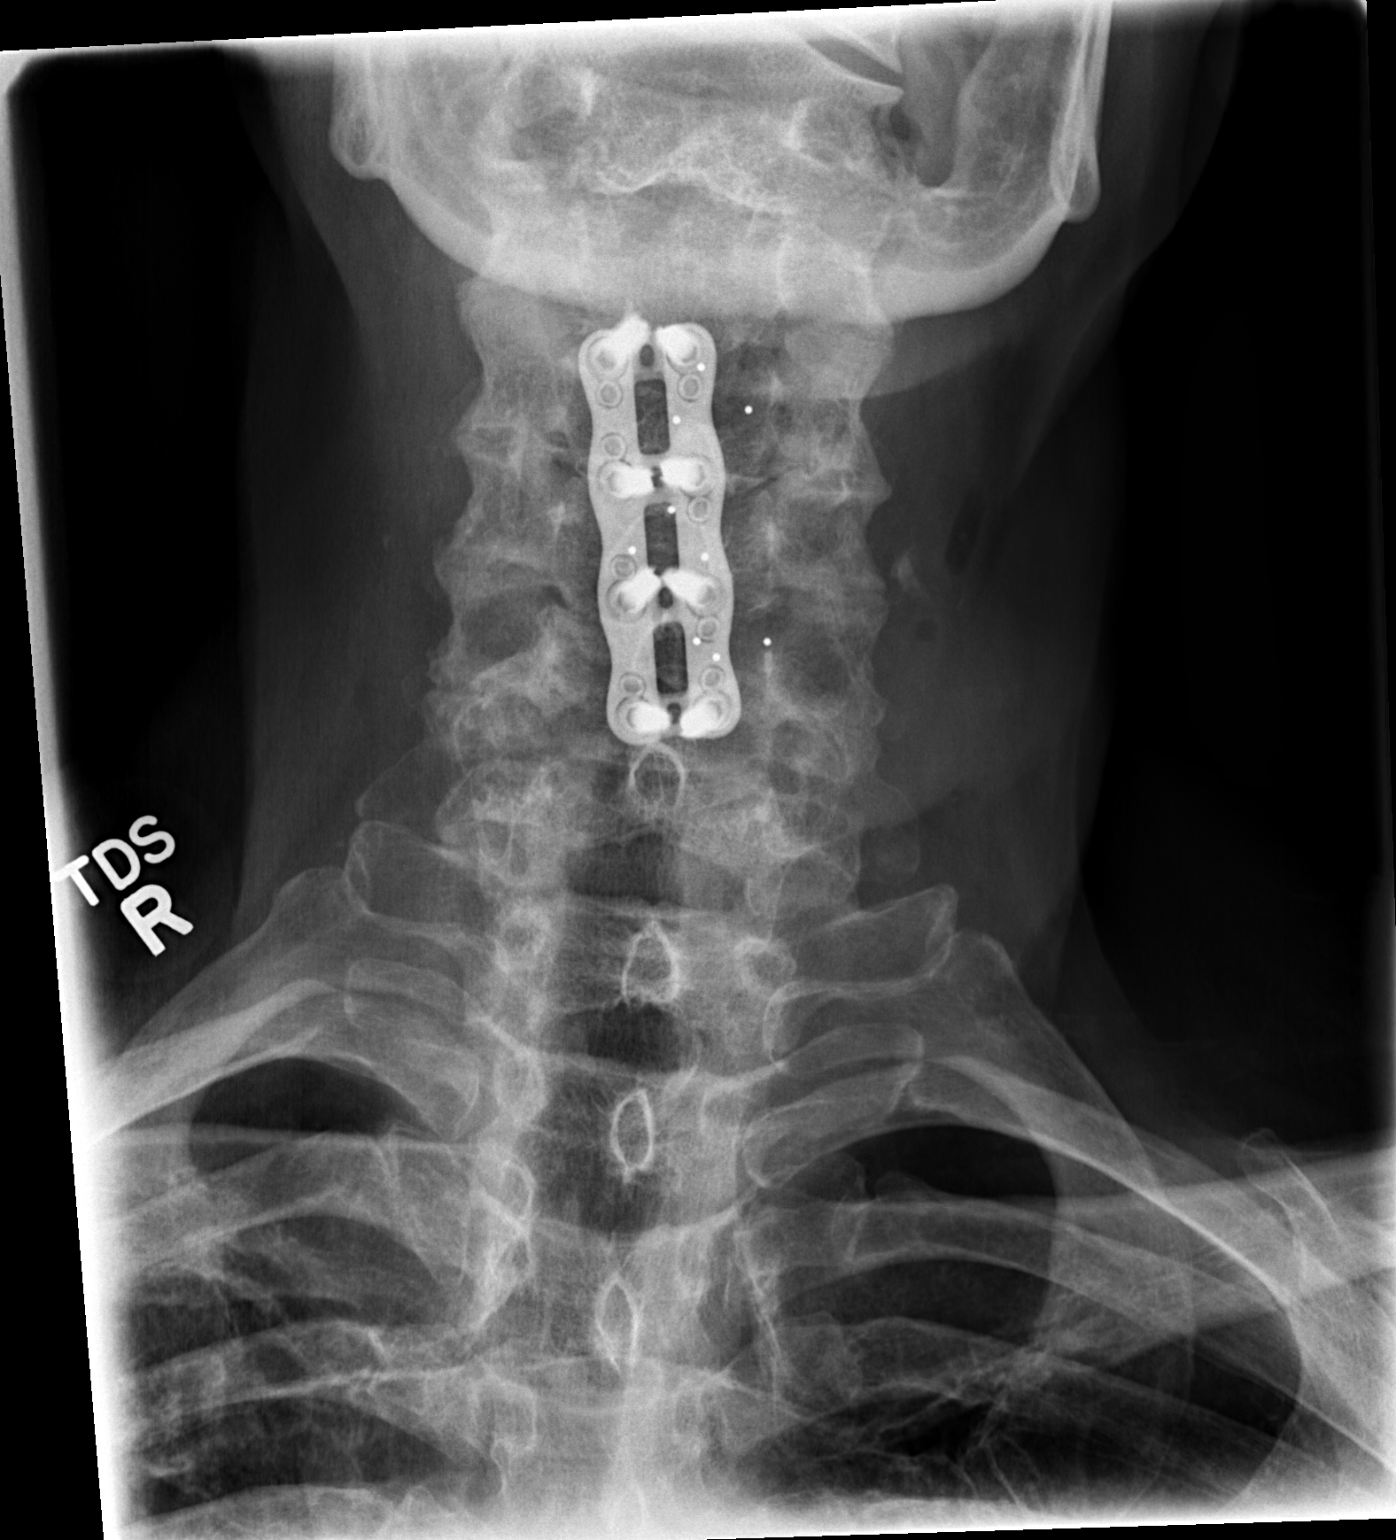

[w c-spine odontoid]
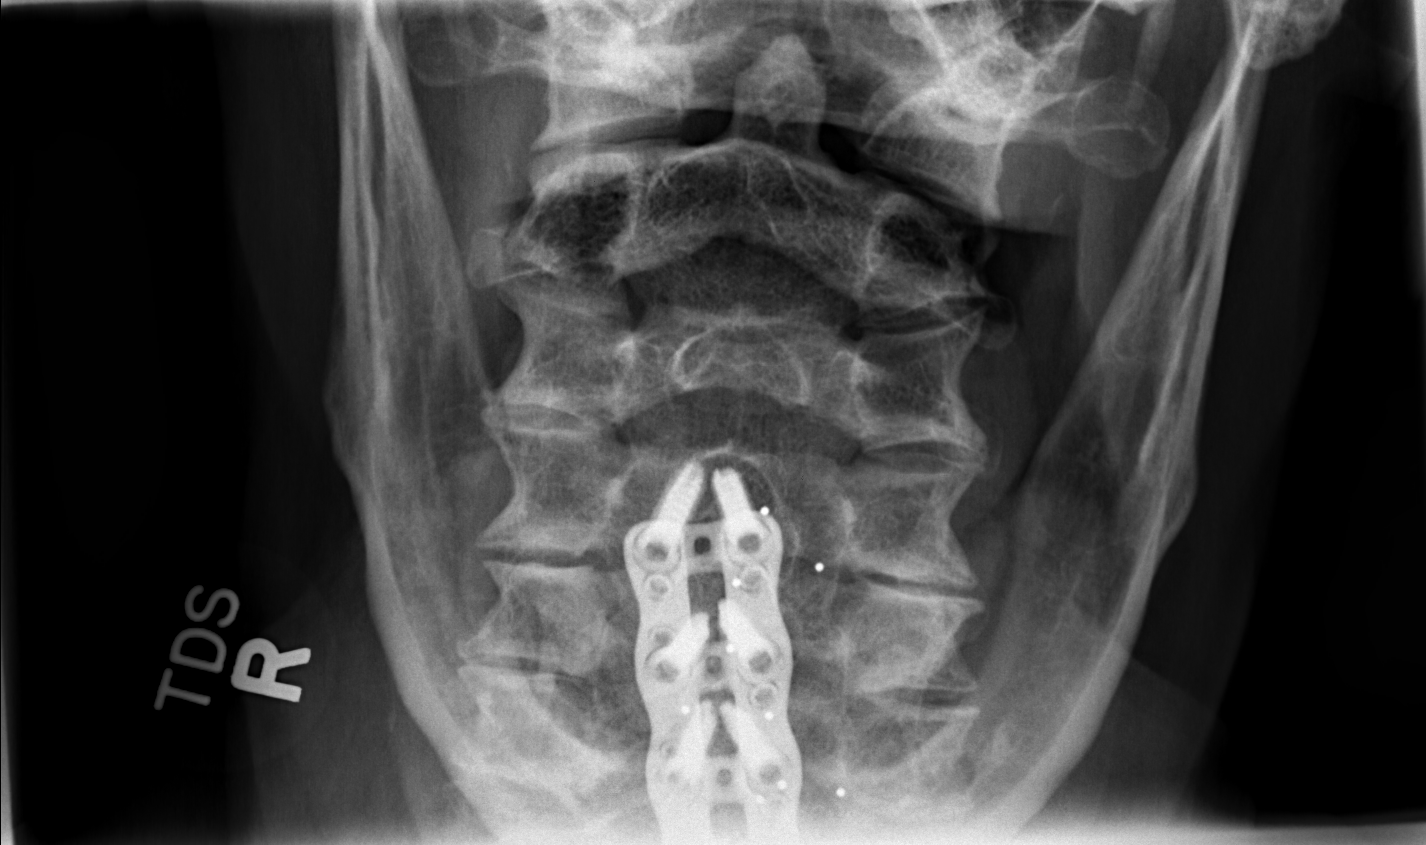

[3 of 3 positions shown; findings below may reference images not displayed]

FINDINGS: Patient is status post anterior fusion C3 through C6. Hardware
appears intact without loosening or failure. No acute osseous
abnormalities. Prevertebral soft tissue swelling is appreciated
likely postsurgical.
IMPRESSION: Patient is status post ACDF C3 through C6. Prevertebral soft tissue
swelling is appreciated likely postsurgical. There is persistent
clinical concern further evaluation with contrasted neck CT
recommended.

## 2014-09-15 IMAGING — CT CT NECK W/ CM
3 of 4 series · 13 of 33 positions shown, 16 images · IV contrast (Omni 300)
Comparison: Cervical spine radiographs 05/13/2013.

CLINICAL DATA: Postop anterior cervical spine fusion. Difficulty
swallowing and neck pain

EXAM:
CT NECK WITH CONTRAST
TECHNIQUE: Multidetector CT imaging of the neck was performed using the
standard protocol following the bolus administration of intravenous
contrast.
CONTRAST:  75mL OMNIPAQUE IOHEXOL 300 MG/ML  SOLN

[Series 2: neck 2.0 i31s 3 · axial · 0.60mm/px · z∈[-405,-221]mm · 5 of 133 slices shown, 7 images]
[im 23/133  soft-tissue]
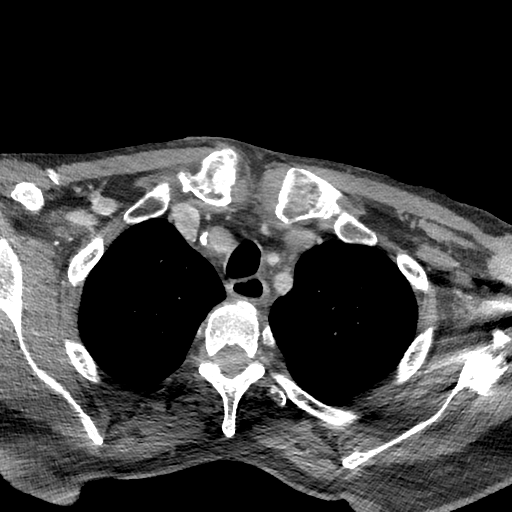
[im 23/133  bone]
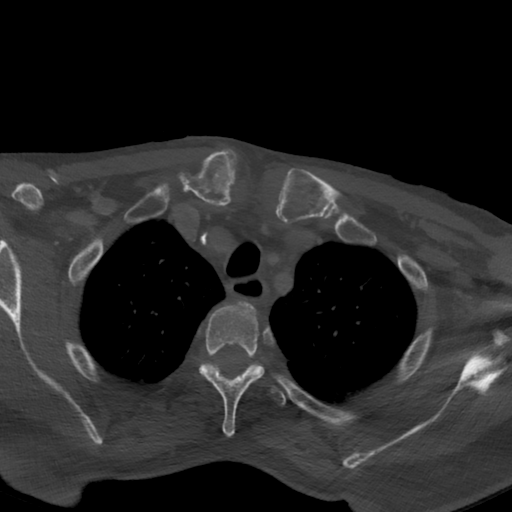
[im 45/133  bone]
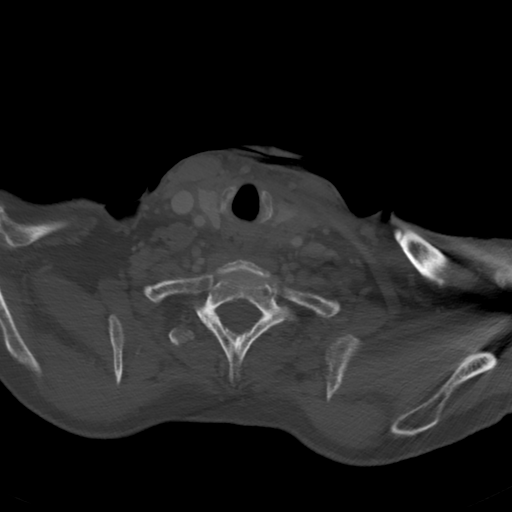
[im 67/133  bone]
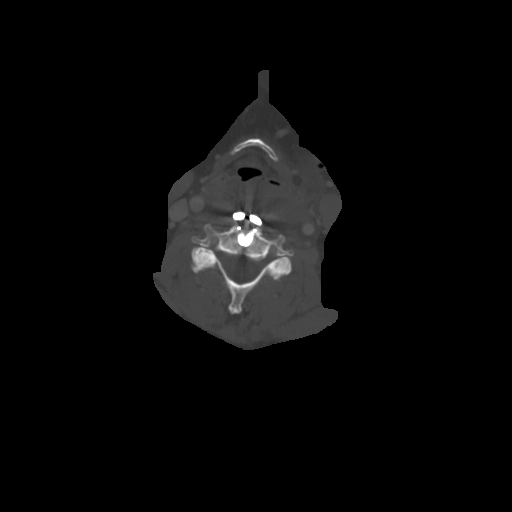
[im 89/133  bone]
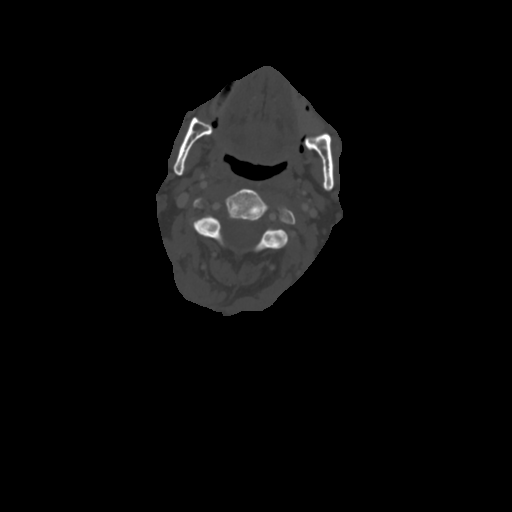
[im 111/133  soft-tissue]
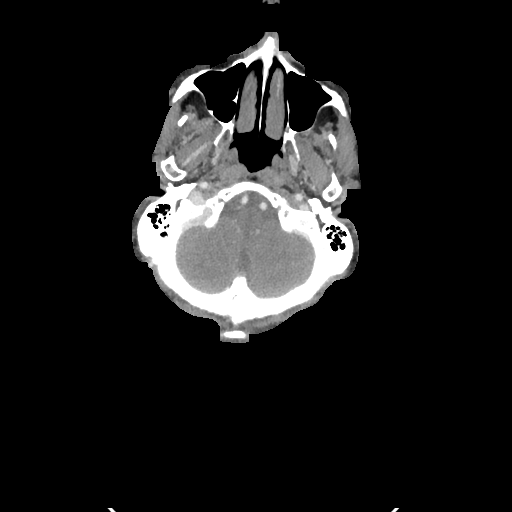
[im 111/133  bone]
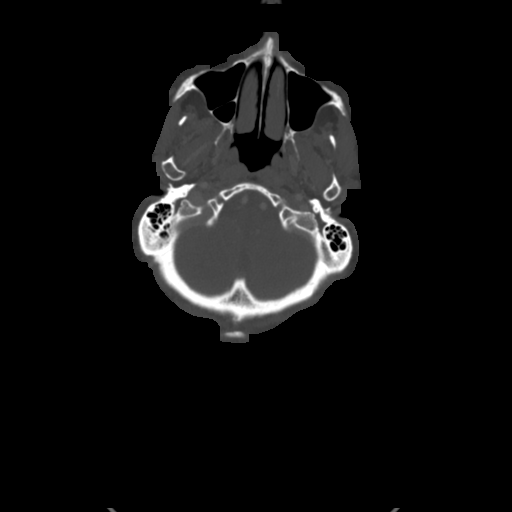

[Series 5: coronal st · coronal · 0.39mm/px · 3 of 110 slices shown]
[im 22/110  bone]
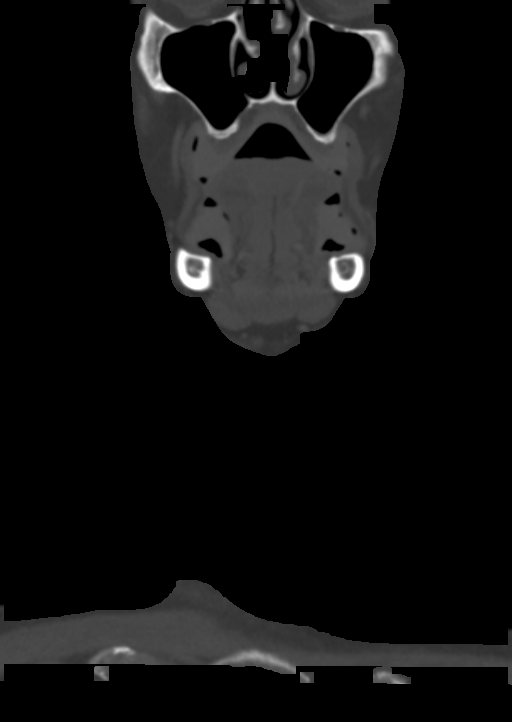
[im 44/110  bone]
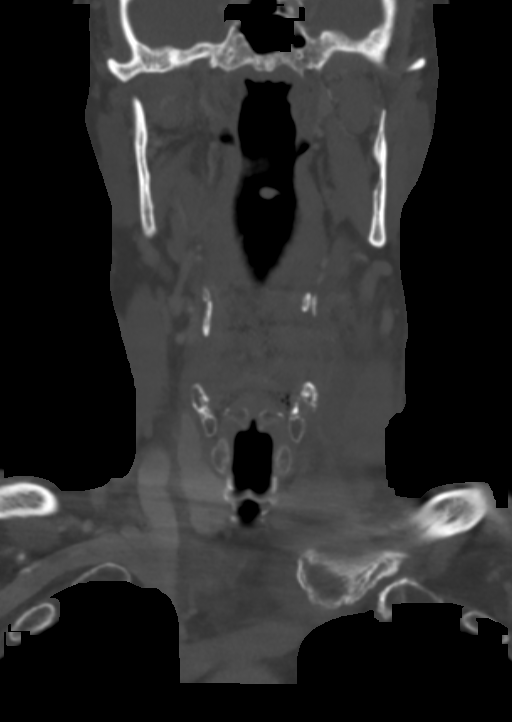
[im 66/110  bone]
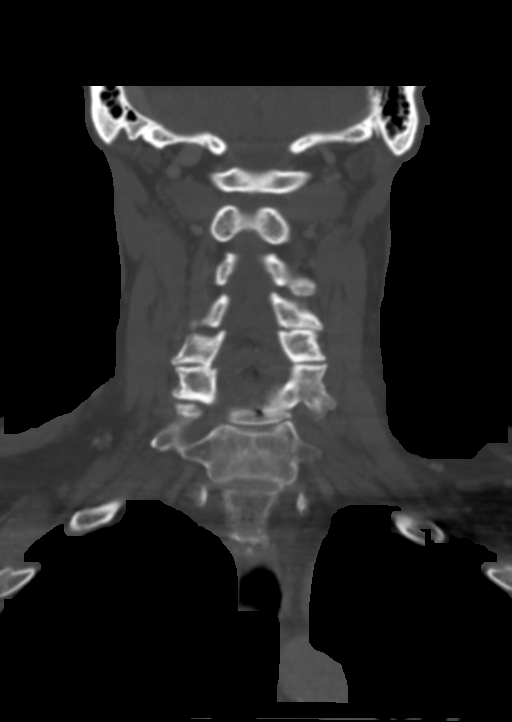

[Series 6: sagittal st · sagittal · 0.51mm/px · 5 of 65 slices shown, 6 images]
[im 22/65  bone]
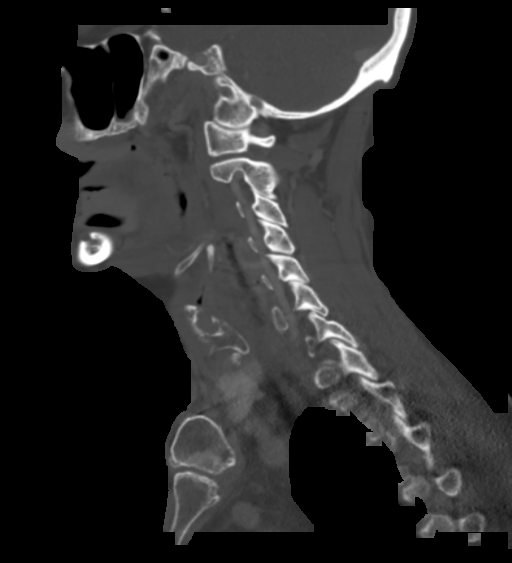
[im 27/65  bone]
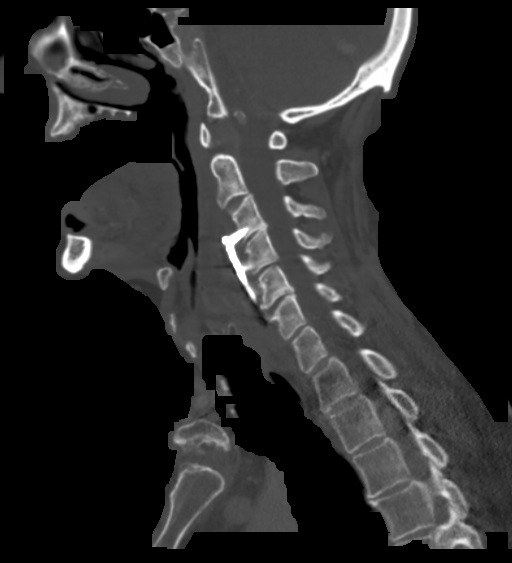
[im 33/65  soft-tissue]
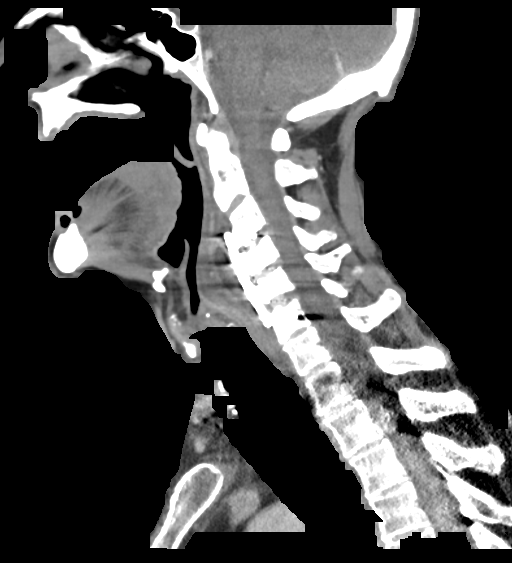
[im 33/65  bone]
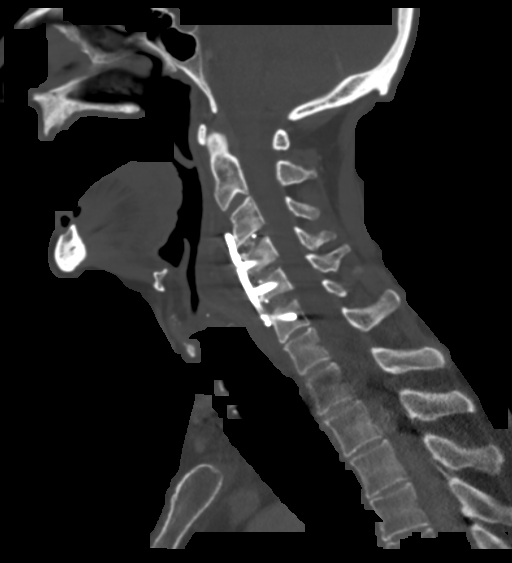
[im 38/65  bone]
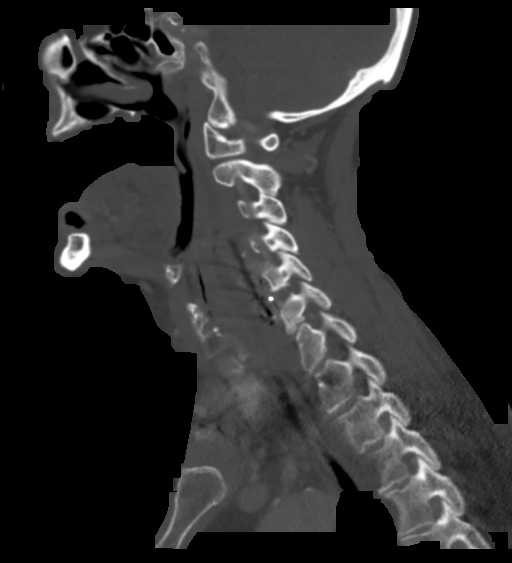
[im 43/65  bone]
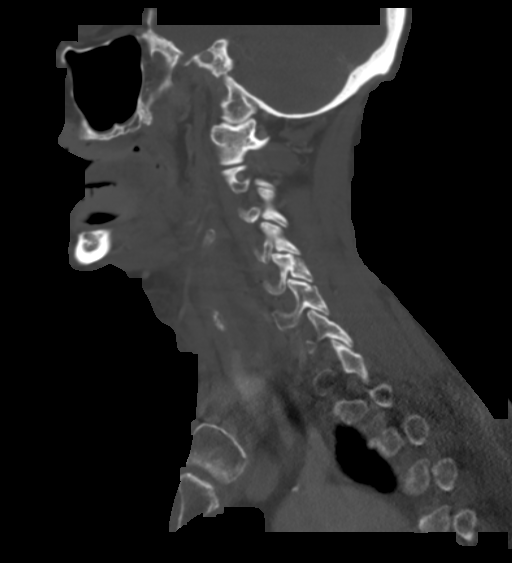

[13 of 33 positions shown; findings below may reference images not displayed]

FINDINGS: The anterior plate and screws appear well position without
complicating features. Anterior and interbody fusion changes at
C3-4, C4-5 and C5-6. There is fairly marked prevertebral soft tissue
swelling/ edema and fluid. I do not see a discrete abscess or
hematoma. This could be liquefying hematoma but CSF leak or
esophageal injury are possible. There is also fluid endplate lumen
extending around left side of the neck through the operative site
with some residual air.

The left internal jugular vein is not well visualized. May be
compressed by the fluid collection. I do not think it is obviously
thrombosed.

The lung apices are clear. There is mild airway impingement above
the level of the true cords.
IMPRESSION: Extensive prevertebral soft tissue swelling/ edema and fluid. This
could be liquefying hematoma related to the recent surgery but a CSF
leak and esophageal injury are also possibilities.

The hardware is intact without complicating features and no spinal
canal compromise is evident.

The left internal jugular vein is not well visualized. It could be
severely compressed.

## 2014-09-16 ENCOUNTER — Encounter: Payer: Self-pay | Admitting: Physician Assistant

## 2014-09-17 ENCOUNTER — Ambulatory Visit (INDEPENDENT_AMBULATORY_CARE_PROVIDER_SITE_OTHER): Payer: Medicare Other | Admitting: Physician Assistant

## 2014-09-17 ENCOUNTER — Encounter: Payer: Self-pay | Admitting: Physician Assistant

## 2014-09-17 VITALS — BP 104/72 | HR 75 | Ht 74.0 in | Wt 181.2 lb

## 2014-09-17 DIAGNOSIS — Z01818 Encounter for other preprocedural examination: Secondary | ICD-10-CM

## 2014-09-17 DIAGNOSIS — I4891 Unspecified atrial fibrillation: Secondary | ICD-10-CM

## 2014-09-17 DIAGNOSIS — I482 Chronic atrial fibrillation: Secondary | ICD-10-CM

## 2014-09-17 DIAGNOSIS — R1013 Epigastric pain: Secondary | ICD-10-CM | POA: Diagnosis not present

## 2014-09-17 DIAGNOSIS — K219 Gastro-esophageal reflux disease without esophagitis: Secondary | ICD-10-CM

## 2014-09-17 DIAGNOSIS — R634 Abnormal weight loss: Secondary | ICD-10-CM | POA: Diagnosis not present

## 2014-09-17 DIAGNOSIS — R112 Nausea with vomiting, unspecified: Secondary | ICD-10-CM

## 2014-09-17 DIAGNOSIS — I4821 Permanent atrial fibrillation: Secondary | ICD-10-CM

## 2014-09-17 NOTE — Progress Notes (Signed)
Cardiology Office Note:  Date of Encounter: 09/17/2014  ID: [name], DOB [birth year], MRN [id number]  PCP:  Jonathan Lofts, MD Primary Cardiologist:  Dr. Percival Spanish, MD  Chief Complaint  Patient presents with  . other    Pt. hasn't been seen for two years and is requesting a cardiac clearance for endoscopy. Meds reviewed by the patient verbally.     HPI:  74 year old male with history of chronic Afib on warfarin last seen in clinic in 2014, HTN, HLD, DM2, OSA obesity s/p gastric bypass in 2013, prior DVT/PE, essential tremor, gait instability believed to be 2/2 esstential tremor vs diabetic neuropathy, dysphagia, and PVD who presents for pre-procedure evaluation needing EGD on 09/21/2014 for persistent reflux symptoms.   He was last seen in clinic on 01/12/2013 for surgical clearance for left shoulder replacement. Most recent echo from 08/2011 showed EF 55-60%, mild LVH, mild to moderate LAE. He was doing well at that time and could acheive >4 METs without symptoms to suggest angina. Since that time he has been seen by his PCP intermittently and followed by the Coumadin clinic. He was seen in by neurology July 2015 for essential tremor that reported started in 2010 after episode of sepsis and started on primidone, which can interact with Coumadin requiring need for increased INR monitoring. He did quite well on primidone in follow up with neuro.   He has known cervical dysphagia with moderate to severe pharyngeal phase dysphagia documented by modified barium swallow on 08/14/2013 after neck hematomas post cervical spine surgery.   He was cleared for penile prothesis inflatable inplant in April 2016 without being seen by the [facility] office.   He presented to PCP in mid June with worsening heart burn. Symptoms of burning in the epigastric region, worse with food consumption. Protonix and carafate did not help. He was referred to GI. CT abdomen did not show evidence of his pain. He underwent  HIDA on 08/24/14 that was normal. He plans to undergo EGD on 8/12 given his ongoing symptoms.   His symptoms lasted for 12 days then spontaneously resolved. He continues to have pain with food consumption which is limiting his food intake. His weight continues to decline because of this. He has not had any angina or exertional symptoms. He lives an active lifestyle and works out on the treadmill daily without any symptoms. His wife reports him to be "fragile" at this time. Since his gastric bypass in 2013 he has continually had almost daily nausea and vomiting. He reports seeing our office yearly for his Afib, though records do not indicate this.        Past Medical History  Diagnosis Date  . Diverticulosis of colon (without mention of hemorrhage)   . Restless legs syndrome (RLS)   . Anxiety state, unspecified   . Hx of gout     but doesn't take any meds  . Bruises easily     pt is on Coumadin  . Cancer     SKIN CANCER REMOVED  . Unspecified essential hypertension     takes Lisinopril daily  . Depressive disorder, not elsewhere classified     takes Prozac daily  . History of blood clots     behind right knee and then went into left lung 59yrs ago  . Peripheral edema     takes Furosemide daily  . Unspecified asthma(493.90)     as a child  . Pneumonia     last time about 56yrs  ago  . Arthritis   . Joint pain   . History of colon polyps   . History of kidney stones   . Hard of hearing     wears hearing aids  . Chronic atrial fibrillation     a. on warfarin  . Peripheral vascular disease   . Pulmonary embolism     over 10 yrs ago "many years ago"  . ED (erectile dysfunction)   . Hx of diabetes mellitus     "no longer diabetic since gastric bypass" -  . Diabetes mellitus without complication   :  Past Surgical History  Procedure Laterality Date  . Knee surgery Right     x 4  . Artery repair      Left forearm  . Foot surgery      Left foot   . Replacement total knee  Left     x 2  . Breath tek h pylori  01/08/2011    Procedure: BREATH TEK H PYLORI;  Surgeon: Pedro Earls, MD;  Location: [facility];  Service: General;  Laterality: N/A;  . Hand surgery      LEFT  . Leg surgery      FOR NECROTIZING FASCITIS L LEG AND GROIN  . 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 Shoulder arthroscopy    . Shoulder surgery Left 2014  . Back surgery      x 3  . Knee surgery Left     arthroscopy  . Eye surgery      cataract bil  . Injections in back      x 18  . Colonoscopy    . Cystoscopy    . Total shoulder arthroplasty Left 01/26/2013    Procedure: TOTAL SHOULDER ARTHROPLASTY;  Surgeon: Nita Sells, MD;  Location: [place];  Service: Orthopedics;  Laterality: Left;  Left total shoulder arthroplasty  . Anterior cervical decomp/discectomy fusion N/A 05/09/2013    Procedure: ANTERIOR CERVICAL DECOMPRESSION/DISCECTOMY FUSION CERVICAL THREE-FOUR,CERVICAL FOUR-FIVE,CERVICAL FIVE-SIX;  Surgeon: Floyce Stakes, MD;  Location: [facility];  Service: Neurosurgery;  Laterality: N/A;  . Penile prosthesis implant N/A 07/02/2014    Procedure: PENILE PROTHESIS INFLATABLE 3 PIECE (COLOPLAST) SCROTAL APPROACH;  Surgeon: Kathie Rhodes, MD;  Location: [facility];  Service: Urology;  Laterality: N/A;  :  Social History:  The patient  reports that he has never smoked. He has never used smokeless tobacco. He reports that he does not drink alcohol or use illicit drugs.   Family History  Problem Relation Age of Onset  . Uterine cancer Mother     mets  . Breast cancer Mother   . Cancer Mother     cervical  . Emphysema Father   . Alzheimer's disease Sister   . Prostate cancer Brother   . Heart attack Brother   . Colon cancer Brother 87     Allergies:  Allergies  Allergen Reactions  . Sulfacetamide Sodium      REACTION: Throat swelling  . Adhesive [Tape]     Tears skin-- "No tape of any kind, they all tear skin right off"     Home Medications:  Current Outpatient Prescriptions  Medication Sig Dispense Refill  . acetaminophen (TYLENOL) 500 MG tablet Take 1,000 mg by mouth every 6 (six) hours as needed for moderate pain or headache.    [name]  atorvastatin (LIPITOR) 80 MG tablet Take 1 tablet by mouth at  bedtime 90 tablet 3  . FLUoxetine (PROZAC) 40 MG capsule TAKE 1 CAPSULE (40 MG TOTAL) BY MOUTH DAILY. 90 capsule 1  . furosemide (LASIX) 20 MG tablet Take 1 to 2 tablets by  mouth daily 180 tablet 1  . gabapentin (NEURONTIN) 300 MG capsule Take 300 mg by mouth at bedtime.     [name] lisinopril (PRINIVIL,ZESTRIL) 2.5 MG tablet Take 2.5 mg by mouth every morning.     . metFORMIN (GLUCOPHAGE) 500 MG tablet Take 1 tablet by mouth  twice a day 180 tablet 1  . omeprazole (PRILOSEC) 40 MG capsule Take 1 capsule by mouth  daily 30 capsule 11  . pantoprazole (PROTONIX) 40 MG tablet Take 1 tablet (40 mg total) by mouth 2 (two) times daily. 60 tablet 3  . PRESCRIPTION MEDICATION Apply 1 application topically at bedtime. Compound with cetaphil for dry skin.    [name] primidone (MYSOLINE) 50 MG tablet TAKE 2 TABLETS BY MOUTH IN THE MORNING, 1 TABLET IN THE EVENING 270 tablet 1  . silodosin (RAPAFLO) 8 MG CAPS capsule Take 1 capsule by mouth every morning.    . sucralfate (CARAFATE) 1 GM/10ML suspension Take 10 mLs (1 g total) by mouth 4 (four) times daily -  with meals and at bedtime. 420 mL 0  . warfarin (COUMADIN) 2.5 MG tablet Take 1 to 1.5 tablets by mouth daily or as directed by coumadin clinic 105 tablet 1  . zolpidem (AMBIEN) 10 MG tablet TAKE 1/2 TO 1 TABLET BY MOUTH AT BEDTIME 30 tablet 0   No current facility-administered medications for this visit.     Review of Systems:  Review of Systems  Constitutional: Positive for weight loss and malaise/fatigue. Negative for fever, chills and diaphoresis.  HENT:  Negative for congestion.   Eyes: Negative for discharge and redness.  Respiratory: Negative for cough, hemoptysis, sputum production, shortness of breath and wheezing.   Cardiovascular: Negative for chest pain, palpitations, orthopnea, claudication, leg swelling and PND.  Gastrointestinal: Positive for heartburn, nausea, vomiting and abdominal pain. Negative for diarrhea, constipation, blood in stool and melena.  Genitourinary: Negative for hematuria.  Musculoskeletal: Negative for myalgias and falls.  Skin: Negative for rash.  Neurological: Positive for weakness. Negative for dizziness, sensory change, speech change, focal weakness and headaches.  Endo/Heme/Allergies: Does not bruise/bleed easily.  Psychiatric/Behavioral: Negative for depression and substance abuse. The patient is not nervous/anxious.   All other systems reviewed and are negative.    Physical Exam:  Height 6\' 2"  (1.88 m), weight 181 lb 4 oz (82.214 kg). BMI: Body mass index is 23.26 kg/(m^2). General: Pleasant, NAD. Psych: Normal affect. Responds to questions with normal affect.  Neuro: Alert and oriented X 3. Moves all extremities spontaneously. HEENT: Normocephalic, atraumatic. EOM intact. Sclera anicteric.  Neck: Trachea midline. Supple without bruits or JVD. Lungs:  Respirations regular and unlabored. CTA bilaterally without wheezing, crackles, or rhonchi.  Heart: RRR, normal s3, s4. No murmurs, rubs, or gallops.  Abdomen: Soft, non-tender, non-distended, BS + x 4.  Extremities: No clubbing, cyanosis or edema. DP/PT/Radials 2+ and equal bilaterally.   Accessory Clinical Findings:  EKG: Afib, 75 bpm, low voltage limb leads, no significant st/t changes   Recent Labs: 08/08/2014: ALT 24; BUN 21; Creatinine, Ser 1.50; Hemoglobin 12.9*; Platelets 194.0; Potassium 3.7; Sodium 138  01/05/2014: Cholesterol 101; HDL 36.60*; LDL Cholesterol 46; Total CHOL/HDL Ratio 3; Triglycerides 90.0; VLDL 18.0  CrCl cannot be  calculated (Patient has no serum creatinine result on file.).  Weights: Wt Readings from Last 3 Encounters:  09/17/14 181 lb 4 oz (82.214 kg)  08/24/14 182 lb (82.555 kg)  08/08/14 182 lb (82.555 kg)    Other studies Reviewed: Additional studies/ records that were reviewed today include: prior notes, phone notes, Coumadin clinic notes.  Assessment & Plan:  1. Pre-op clearance:  -Patient is cleared for EGD on 09/21/2014 -He held warfarin beginning on 8/7 -Would restart post procedure with loading dose and follow up INR the following week  2. Chronic Afib: -Rate controlled, not on any rate controlling medications -Warfarin held as above -Restart as above  3. Reflux/abdominal pain: -EGD as above -Advise ischemic evaluation as part of his work up. Patient would like to wait to see what his EGD shows  4. Memory issues: -Patient reports seeing Dr. Percival Mercado yearly and having evaluations and that have no record of   -Advise PCP look into possible dementia   5. Status post gastric bypass in 2013: -Ongoing nausea vomiting daily -Perhaps this is playing a role in #3   Dispo: -Follow up 6 months   Current medicines are reviewed at length with the patient today.  The patient did not have any concerns regarding medicines.   Christell Faith, PA-C [facility] [place] [address] [place], [place] [address] [phone] [facility] 09/17/2014, 1:20 PM

## 2014-09-17 NOTE — Patient Instructions (Addendum)
Medication Instructions:  Your physician has recommended you make the following change in your medication:  HOLD warfarin until after your GI procedure then take restart per GI doctor at one dose at 1.5-2 times your normal dose. Take this only once then resume your normal dosage.   Labwork: Your physician recommends that you return for lab work August 10: PT/INR (Coumadin clinic)   Testing/Procedures: none  Follow-Up: Call us Friday with EGD results. Depending on results, your physician may order a lexi myoview   Any Other Special Instructions Will Be Listed Below (If Applicable).

## 2014-09-19 DIAGNOSIS — E1142 Type 2 diabetes mellitus with diabetic polyneuropathy: Secondary | ICD-10-CM | POA: Diagnosis not present

## 2014-09-19 DIAGNOSIS — Z9884 Bariatric surgery status: Secondary | ICD-10-CM | POA: Diagnosis not present

## 2014-09-19 DIAGNOSIS — R1013 Epigastric pain: Secondary | ICD-10-CM | POA: Diagnosis not present

## 2014-09-20 DIAGNOSIS — Z85828 Personal history of other malignant neoplasm of skin: Secondary | ICD-10-CM | POA: Diagnosis not present

## 2014-09-20 DIAGNOSIS — L820 Inflamed seborrheic keratosis: Secondary | ICD-10-CM | POA: Diagnosis not present

## 2014-09-21 ENCOUNTER — Encounter: Payer: Self-pay | Admitting: Anesthesiology

## 2014-09-21 ENCOUNTER — Encounter
Admission: RE | Disposition: A | Discharge status: Home / Self Care | Payer: Self-pay | Source: Ambulatory Visit | Attending: Unknown Physician Specialty

## 2014-09-21 ENCOUNTER — Ambulatory Visit: Payer: Medicare Other | Admitting: Anesthesiology

## 2014-09-21 ENCOUNTER — Telehealth: Payer: Self-pay | Admitting: Physician Assistant

## 2014-09-21 ENCOUNTER — Other Ambulatory Visit: Payer: Self-pay

## 2014-09-21 ENCOUNTER — Ambulatory Visit
Admission: RE | Admit: 2014-09-21 | Discharge: 2014-09-21 | Disposition: A | Discharge status: Home / Self Care | Payer: Medicare Other | Source: Ambulatory Visit | Attending: Unknown Physician Specialty | Admitting: Unknown Physician Specialty

## 2014-09-21 DIAGNOSIS — I739 Peripheral vascular disease, unspecified: Secondary | ICD-10-CM | POA: Diagnosis not present

## 2014-09-21 DIAGNOSIS — Z85828 Personal history of other malignant neoplasm of skin: Secondary | ICD-10-CM | POA: Diagnosis not present

## 2014-09-21 DIAGNOSIS — F329 Major depressive disorder, single episode, unspecified: Secondary | ICD-10-CM | POA: Diagnosis not present

## 2014-09-21 DIAGNOSIS — K5730 Diverticulosis of large intestine without perforation or abscess without bleeding: Secondary | ICD-10-CM | POA: Diagnosis not present

## 2014-09-21 DIAGNOSIS — Z9841 Cataract extraction status, right eye: Secondary | ICD-10-CM | POA: Diagnosis not present

## 2014-09-21 DIAGNOSIS — I482 Chronic atrial fibrillation: Secondary | ICD-10-CM | POA: Insufficient documentation

## 2014-09-21 DIAGNOSIS — I4891 Unspecified atrial fibrillation: Secondary | ICD-10-CM | POA: Insufficient documentation

## 2014-09-21 DIAGNOSIS — Z803 Family history of malignant neoplasm of breast: Secondary | ICD-10-CM | POA: Insufficient documentation

## 2014-09-21 DIAGNOSIS — R1013 Epigastric pain: Secondary | ICD-10-CM | POA: Insufficient documentation

## 2014-09-21 DIAGNOSIS — Z800 Family history of malignant neoplasm of digestive organs: Secondary | ICD-10-CM | POA: Diagnosis not present

## 2014-09-21 DIAGNOSIS — I10 Essential (primary) hypertension: Secondary | ICD-10-CM | POA: Diagnosis not present

## 2014-09-21 DIAGNOSIS — F419 Anxiety disorder, unspecified: Secondary | ICD-10-CM | POA: Insufficient documentation

## 2014-09-21 DIAGNOSIS — I25810 Atherosclerosis of coronary artery bypass graft(s) without angina pectoris: Secondary | ICD-10-CM | POA: Diagnosis not present

## 2014-09-21 DIAGNOSIS — M109 Gout, unspecified: Secondary | ICD-10-CM | POA: Diagnosis not present

## 2014-09-21 DIAGNOSIS — Z87442 Personal history of urinary calculi: Secondary | ICD-10-CM | POA: Insufficient documentation

## 2014-09-21 DIAGNOSIS — Z86718 Personal history of other venous thrombosis and embolism: Secondary | ICD-10-CM | POA: Diagnosis not present

## 2014-09-21 DIAGNOSIS — G2581 Restless legs syndrome: Secondary | ICD-10-CM | POA: Diagnosis not present

## 2014-09-21 DIAGNOSIS — M2550 Pain in unspecified joint: Secondary | ICD-10-CM | POA: Insufficient documentation

## 2014-09-21 DIAGNOSIS — Z86711 Personal history of pulmonary embolism: Secondary | ICD-10-CM | POA: Diagnosis not present

## 2014-09-21 DIAGNOSIS — Z9884 Bariatric surgery status: Secondary | ICD-10-CM | POA: Insufficient documentation

## 2014-09-21 DIAGNOSIS — Z91048 Other nonmedicinal substance allergy status: Secondary | ICD-10-CM | POA: Diagnosis not present

## 2014-09-21 DIAGNOSIS — Z9842 Cataract extraction status, left eye: Secondary | ICD-10-CM | POA: Diagnosis not present

## 2014-09-21 DIAGNOSIS — Z96612 Presence of left artificial shoulder joint: Secondary | ICD-10-CM | POA: Diagnosis not present

## 2014-09-21 DIAGNOSIS — M1990 Unspecified osteoarthritis, unspecified site: Secondary | ICD-10-CM | POA: Insufficient documentation

## 2014-09-21 DIAGNOSIS — Z79899 Other long term (current) drug therapy: Secondary | ICD-10-CM | POA: Insufficient documentation

## 2014-09-21 DIAGNOSIS — Z96652 Presence of left artificial knee joint: Secondary | ICD-10-CM | POA: Diagnosis not present

## 2014-09-21 DIAGNOSIS — Z8049 Family history of malignant neoplasm of other genital organs: Secondary | ICD-10-CM | POA: Diagnosis not present

## 2014-09-21 DIAGNOSIS — R079 Chest pain, unspecified: Secondary | ICD-10-CM

## 2014-09-21 DIAGNOSIS — Z7901 Long term (current) use of anticoagulants: Secondary | ICD-10-CM | POA: Insufficient documentation

## 2014-09-21 DIAGNOSIS — Z86010 Personal history of colonic polyps: Secondary | ICD-10-CM | POA: Insufficient documentation

## 2014-09-21 DIAGNOSIS — E119 Type 2 diabetes mellitus without complications: Secondary | ICD-10-CM | POA: Diagnosis not present

## 2014-09-21 DIAGNOSIS — Z8489 Family history of other specified conditions: Secondary | ICD-10-CM | POA: Diagnosis not present

## 2014-09-21 DIAGNOSIS — Z8042 Family history of malignant neoplasm of prostate: Secondary | ICD-10-CM | POA: Insufficient documentation

## 2014-09-21 DIAGNOSIS — K2970 Gastritis, unspecified, without bleeding: Secondary | ICD-10-CM | POA: Insufficient documentation

## 2014-09-21 DIAGNOSIS — Z888 Allergy status to other drugs, medicaments and biological substances status: Secondary | ICD-10-CM | POA: Insufficient documentation

## 2014-09-21 DIAGNOSIS — H9190 Unspecified hearing loss, unspecified ear: Secondary | ICD-10-CM | POA: Insufficient documentation

## 2014-09-21 DIAGNOSIS — R609 Edema, unspecified: Secondary | ICD-10-CM | POA: Diagnosis not present

## 2014-09-21 DIAGNOSIS — K2960 Other gastritis without bleeding: Secondary | ICD-10-CM | POA: Diagnosis not present

## 2014-09-21 DIAGNOSIS — R1310 Dysphagia, unspecified: Secondary | ICD-10-CM | POA: Diagnosis not present

## 2014-09-21 DIAGNOSIS — Z8249 Family history of ischemic heart disease and other diseases of the circulatory system: Secondary | ICD-10-CM | POA: Insufficient documentation

## 2014-09-21 HISTORY — PX: ESOPHAGOGASTRODUODENOSCOPY (EGD) WITH PROPOFOL: SHX5813

## 2014-09-21 LAB — GLUCOSE, CAPILLARY: Glucose-Capillary: 128 mg/dL — ABNORMAL HIGH (ref 65–99)

## 2014-09-21 SURGERY — ESOPHAGOGASTRODUODENOSCOPY (EGD) WITH PROPOFOL
Anesthesia: General

## 2014-09-21 MED ORDER — PIPERACILLIN-TAZOBACTAM 3.375 G IVPB 30 MIN
3.3750 g | Freq: Once | INTRAVENOUS | Status: AC
  Administered 2014-09-21: 3.375 g via INTRAVENOUS
  Filled 2014-09-21: qty 50

## 2014-09-21 MED ORDER — PROPOFOL INFUSION 10 MG/ML OPTIME
INTRAVENOUS | Status: DC | PRN
  Administered 2014-09-21: 100 ug/kg/min via INTRAVENOUS

## 2014-09-21 MED ORDER — MIDAZOLAM HCL 2 MG/2ML IJ SOLN
INTRAMUSCULAR | Status: DC | PRN
  Administered 2014-09-21: 1 mg via INTRAVENOUS

## 2014-09-21 MED ORDER — SODIUM CHLORIDE 0.9 % IV SOLN: INTRAVENOUS | Status: DC

## 2014-09-21 MED ORDER — SODIUM CHLORIDE 0.9 % IV SOLN
INTRAVENOUS | Status: DC
  Administered 2014-09-21: 1000 mL via INTRAVENOUS

## 2014-09-21 MED ORDER — FENTANYL CITRATE (PF) 100 MCG/2ML IJ SOLN
INTRAMUSCULAR | Status: DC | PRN
  Administered 2014-09-21: 50 ug via INTRAVENOUS

## 2014-09-21 NOTE — H&P (Signed)
Primary Care Physician:  Eliezer Lofts, MD Primary Gastroenterologist:  Dr. Vira Agar  Pre-Procedure History & Physical: HPI:  [name] is a 74 y.o. male is here for an endoscopy.   Past Medical History  Diagnosis Date  . Diverticulosis of colon (without mention of hemorrhage)   . Restless legs syndrome (RLS)   . Anxiety state, unspecified   . Hx of gout     but doesn't take any meds  . Bruises easily     pt is on Coumadin  . Cancer     SKIN CANCER REMOVED  . Unspecified essential hypertension     takes Lisinopril daily  . Depressive disorder, not elsewhere classified     takes Prozac daily  . History of blood clots     behind right knee and then went into left lung 71yrs ago  . Peripheral edema     takes Furosemide daily  . Unspecified asthma(493.90)     as a child  . Pneumonia     last time about 5yrs ago  . Arthritis   . Joint pain   . History of colon polyps   . History of kidney stones   . Hard of hearing     wears hearing aids  . Chronic atrial fibrillation     a. on warfarin  . Peripheral vascular disease   . Pulmonary embolism     over 10 yrs ago "many years ago"  . ED (erectile dysfunction)   . Hx of diabetes mellitus     "no longer diabetic since gastric bypass" -  . Diabetes mellitus without complication     Past Surgical History  Procedure Laterality Date  . Knee surgery Right     x 4  . Artery repair      Left forearm  . Foot surgery      Left foot   . Replacement total knee Left     x 2  . Breath tek h pylori  01/08/2011    Procedure: BREATH TEK H PYLORI;  Surgeon: Pedro Earls, MD;  Location: [facility];  Service: General;  Laterality: N/A;  . Hand surgery      LEFT  . Leg surgery      FOR NECROTIZING FASCITIS L LEG AND GROIN  . 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  Shoulder arthroscopy    . Shoulder surgery Left 2014  . Back surgery      x 3  . Knee surgery Left     arthroscopy  . Eye surgery      cataract bil  . Injections in back      x 18  . Colonoscopy    . Cystoscopy    . Total shoulder arthroplasty Left 01/26/2013    Procedure: TOTAL SHOULDER ARTHROPLASTY;  Surgeon: Nita Sells, MD;  Location: [facility];  Service: Orthopedics;  Laterality: Left;  Left total shoulder arthroplasty  . Anterior cervical decomp/discectomy fusion N/A 05/09/2013    Procedure: ANTERIOR CERVICAL DECOMPRESSION/DISCECTOMY FUSION CERVICAL THREE-FOUR,CERVICAL FOUR-FIVE,CERVICAL FIVE-SIX;  Surgeon: Floyce Stakes, MD;  Location: [facility];  Service: Neurosurgery;  Laterality: N/A;  . Penile prosthesis implant N/A 07/02/2014    Procedure: PENILE PROTHESIS INFLATABLE 3 PIECE (COLOPLAST) SCROTAL APPROACH;  Surgeon: Kathie Rhodes, MD;  Location: [facility];  Service: Urology;  Laterality: N/A;  Prior to Admission medications   Medication Sig Start Date End Date Taking? Authorizing Provider  acetaminophen (TYLENOL) 500 MG tablet Take 1,000 mg by mouth every 6 (six) hours as needed for moderate pain or headache.   Yes Historical Provider, MD  atorvastatin (LIPITOR) 80 MG tablet Take 1 tablet by mouth at  bedtime 03/11/14  Yes Amy E Bedsole, MD  FLUoxetine (PROZAC) 40 MG capsule TAKE 1 CAPSULE (40 MG TOTAL) BY MOUTH DAILY. 08/07/14  Yes Amy Cletis Athens, MD  furosemide (LASIX) 20 MG tablet Take 1 to 2 tablets by  mouth daily 08/03/14  Yes Amy E Bedsole, MD  gabapentin (NEURONTIN) 300 MG capsule Take 300 mg by mouth at bedtime.  12/07/13  Yes Historical Provider, MD  lisinopril (PRINIVIL,ZESTRIL) 2.5 MG tablet Take 2.5 mg by mouth every morning.    Yes Historical Provider, MD  metFORMIN (GLUCOPHAGE) 500 MG tablet Take 1 tablet by mouth  twice a day 08/06/14  Yes Amy E Diona Browner, MD  omeprazole (PRILOSEC) 40 MG capsule Take 1 capsule by mouth  daily 08/03/14  Yes Amy E Bedsole, MD   pantoprazole (PROTONIX) 40 MG tablet Take 1 tablet (40 mg total) by mouth 2 (two) times daily. 08/09/14  Yes Laban Emperor Zehr, PA-C  PRESCRIPTION MEDICATION Apply 1 application topically at bedtime. Compound with cetaphil for dry skin.   Yes Historical Provider, MD  primidone (MYSOLINE) 50 MG tablet TAKE 2 TABLETS BY MOUTH IN THE MORNING, 1 TABLET IN THE EVENING 07/12/14  Yes Rebecca S Tat, DO  silodosin (RAPAFLO) 8 MG CAPS capsule Take 1 capsule by mouth every morning. 01/13/13  Yes Historical Provider, MD  sucralfate (CARAFATE) 1 GM/10ML suspension Take 10 mLs (1 g total) by mouth 4 (four) times daily -  with meals and at bedtime. 07/28/14  Yes Amy Cletis Athens, MD  warfarin (COUMADIN) 2.5 MG tablet Take 1 to 1.5 tablets by mouth daily or as directed by coumadin clinic 08/07/14  Yes Minus Breeding, MD  zolpidem (AMBIEN) 10 MG tablet TAKE 1/2 TO 1 TABLET BY MOUTH AT BEDTIME 09/06/14  Yes Amy Cletis Athens, MD    Allergies as of 09/03/2014 - Review Complete 08/24/2014  Allergen Reaction Noted  . Sulfacetamide sodium    . Adhesive [tape]  01/19/2013    Family History  Problem Relation Age of Onset  . Uterine cancer Mother     mets  . Breast cancer Mother   . Cancer Mother     cervical  . Emphysema Father   . Alzheimer's disease Sister   . Prostate cancer Brother   . Heart attack Brother   . Colon cancer Brother 43    Social History   Social History  . Marital Status: Married    Spouse Name: N/A  . Number of Children: 1  . Years of Education: N/A   Occupational History  . Retired     Administrator   Social History Main Topics  . Smoking status: Never Smoker   . Smokeless tobacco: Never Used  . Alcohol Use: No  . Drug Use: No  . Sexual Activity: Yes   Other Topics Concern  . Not on file   Social History Narrative   DIET: 3 meals, F&V, some water, crystal light, No fast food   Exercise: walks treadmill 30 min      1 Caffeine drinks daily       No living will.            Review of  Systems: See HPI, otherwise negative ROS  Physical Exam: BP 111/72 mmHg  Pulse 86  Temp(Src) 96 F (35.6 C) (Tympanic)  Resp 18  Ht 6\' 2"  (1.88 m)  Wt 79.833 kg (176 lb)  BMI 22.59 kg/m2  SpO2 100% General:   Alert,  pleasant and cooperative in NAD Head:  Normocephalic and atraumatic. Neck:  Supple; no masses or thyromegaly. Lungs:  Clear throughout to auscultation.    Heart:  Regular rate and rhythm. Abdomen:  Soft, nontender and nondistended. Normal bowel sounds, without guarding, and without rebound.   Neurologic:  Alert and  oriented x4;  grossly normal neurologically.  Impression/Plan: ANTWINE AGOSTO is here for an endoscopy to be performed for epigastric abd pain.  Risks, benefits, limitations, and alternatives regarding  endoscopy have been reviewed with the patient.  Questions have been answered.  All parties agreeable.   Gaylyn Cheers, MD  09/21/2014, 8:04 AM

## 2014-09-21 NOTE — Patient Instructions (Signed)
Your physician has requested that you have a lexiscan myoview.   [name]  Your caregiver has ordered a Stress Test with nuclear imaging. The purpose of this test is to evaluate the blood supply to your heart muscle. This procedure is referred to as a "Non-Invasive Stress Test." This is because other than having an IV started in your vein, nothing is inserted or "invades" your body. Cardiac stress tests are done to find areas of poor blood flow to the heart by determining the extent of coronary artery disease (CAD). Some patients exercise on a treadmill, which naturally increases the blood flow to your heart, while others who are  unable to walk on a treadmill due to physical limitations have a pharmacologic/chemical stress agent called Lexiscan . This medicine will mimic walking on a treadmill by temporarily increasing your coronary blood flow.   Please note: these test may take anywhere between 2-4 hours to complete  PLEASE REPORT TO [facility] AT THE FIRST DESK WILL DIRECT YOU WHERE TO GO  Date of Procedure: Friday, August 19, 8:30am  Arrival Time for Procedure: 7:45am  Instructions regarding medication:   __xx__ : Hold metformin 24 hours before and 48 hours after procedure    xx____:  Hold other medications as follows:_Lasix________________________________________________________________________________________________________________________________________________________________________________________________________________________________________________________________________________________  PLEASE NOTIFY THE OFFICE AT LEAST 24 HOURS IN ADVANCE IF YOU ARE UNABLE TO KEEP YOUR APPOINTMENT.  [phone] AND  PLEASE NOTIFY NUCLEAR MEDICINE AT [facility] AT LEAST 24 HOURS IN ADVANCE IF YOU ARE UNABLE TO KEEP YOUR APPOINTMENT. [phone]  How to prepare for your Myoview test:   Do not eat or drink after midnight  No caffeine for 24 hours prior  to test  No smoking 24 hours prior to test.  Your medication may be taken with water.  If your doctor stopped a medication because of this test, do not take that medication.  Ladies, please do not wear dresses.  Skirts or pants are appropriate. Please wear a short sleeve shirt.  No perfume, cologne or lotion.  Wear comfortable walking shoes. No heels!          Nuclear Medicine Exam A nuclear medicine exam is a safe and painless imaging test. It helps to detect and diagnose disease in the body as well as provide information about organ function and structure.  Nuclear scans are most often done of the:  Lungs.  Heart.  Thyroid gland.  Bones.  Abdomen. HOW A NUCLEAR MEDICINE EXAM WORKS A nuclear medicine exam works by using a radioactive tracer. The material is given either by an IV (intravenous) injection or it may be swallowed. After the tracer is in the body, it is absorbed by your body's organs. A large scanning machine that uses a special camera detects the radioactivity in your body. A computerized image is then formed regarding the area of concern. The small amounts of radioactive material used in a nuclear medicine exam are found to be medically safe. However, because radioactive material is used, this test is not done if you are pregnant or nursing.  BEFORE THE PROCEDURE  If available, bring previous imaging studies such as x-rays, etc. with you to the exam.  Arrive early for your exam. PROCEDURE  An IV may be started before the exam begins.  Depending on the type of examination, will lie on a table or sit in a chair during the exam.  The nuclear medicine exam will take about 30 to 60 minutes to complete. AFTER THE  PROCEDURE  After your scan is completed, the image(s) will be evaluated by a specialist. It is important that you follow up with your caregiver to find out your test results.  You may return to your regular activity as instructed by your  caregiver. SEEK IMMEDIATE MEDICAL CARE IF: You have shortness of breath or difficulty breathing. MAKE SURE YOU:   Understand these instructions.  Will watch your condition.  Will get help right away if you are not doing well or get worse. Document Released: 03/05/2004 Document Revised: 04/20/2011 Document Reviewed: 04/19/2008 [facility] Patient Information 2015 [place], [place]. This information is not intended to replace advice given to you by your health care provider. Make sure you discuss any questions you have with your health care provider.

## 2014-09-21 NOTE — Telephone Encounter (Signed)
Forwarding to [employer], PA for review.

## 2014-09-21 NOTE — Telephone Encounter (Signed)
Left message on machine for patient to contact the office.   

## 2014-09-21 NOTE — Anesthesia Procedure Notes (Signed)
Performed by: COOK-MARTIN, Jemario Poitras Pre-anesthesia Checklist: Patient identified, Emergency Drugs available, Suction available, Patient being monitored and Timeout performed Patient Re-evaluated:Patient Re-evaluated prior to inductionOxygen Delivery Method: Nasal cannula Preoxygenation: Pre-oxygenation with 100% oxygen Intubation Type: IV induction Airway Equipment and Method: Bite block Placement Confirmation: CO2 detector and positive ETCO2     

## 2014-09-21 NOTE — Telephone Encounter (Signed)
Per our discussion at his OV he needs a nuclear stress test. He did not want this until after his GI work up. He still needs this. Please schedule him for this as this has not changed.

## 2014-09-21 NOTE — Telephone Encounter (Signed)
Per Ignacia Bayley, NP, ok to schedule [facility] Friday 8/19 AM. Scheduled for 8:30am Left message on pt home phone to call back to confirm time and date.

## 2014-09-21 NOTE — Transfer of Care (Signed)
Immediate Anesthesia Transfer of Care Note  Patient: KIYAN BURMESTER  Procedure(s) Performed: Procedure(s): ESOPHAGOGASTRODUODENOSCOPY (EGD) WITH PROPOFOL (N/A)  Patient Location: PACU  Anesthesia Type:General  Level of Consciousness: awake and sedated  Airway & Oxygen Therapy: Patient Spontanous Breathing and Patient connected to nasal cannula oxygen  Post-op Assessment: Report given to RN and Post -op Vital signs reviewed and stable  Post vital signs: Reviewed and stable  Last Vitals:  Filed Vitals:   09/21/14 0830  BP: 86/64  Pulse: 80  Temp: 36.3 C  Resp: 16    Complications: No apparent anesthesia complications

## 2014-09-21 NOTE — Telephone Encounter (Signed)
patient has egd results for [name]   Per patient stomach is not completley healed and meds for reflux not helping.  Gave new rx to help with healing.   Patient wants to know what the careplan is now from a cardiac standpoint.  Please see [facility] notes .  And call patient

## 2014-09-21 NOTE — Anesthesia Preprocedure Evaluation (Signed)
Anesthesia Evaluation  Patient identified by MRN, date of birth, ID band Patient awake    Reviewed: Allergy & Precautions, H&P , NPO status , Patient's Chart, lab work & pertinent test results, reviewed documented beta blocker date and time   History of Anesthesia Complications Negative for: history of anesthetic complications  Airway Mallampati: II  TM Distance: >3 FB Neck ROM: full    Dental  (+) Edentulous Upper, Edentulous Lower   Pulmonary neg shortness of breath, asthma , neg sleep apnea, neg pneumonia -, neg recent URI,  DVT with PE in 1993, none since breath sounds clear to auscultation  Pulmonary exam normal       Cardiovascular Exercise Tolerance: Good hypertension, - angina+ Peripheral Vascular Disease - CAD, - Past MI and - CABG Normal cardiovascular exam+ dysrhythmias Atrial Fibrillation - Valvular Problems/MurmursRhythm:regular Rate:Normal     Neuro/Psych PSYCHIATRIC DISORDERS (depression)  Neuromuscular disease (peripheral neuropathy)    GI/Hepatic Neg liver ROS, GERD-  Medicated and Poorly Controlled,  Endo/Other  diabetes, Well Controlled  Renal/GU CRFRenal disease  negative genitourinary   Musculoskeletal   Abdominal   Peds  Hematology negative hematology ROS (+)   Anesthesia Other Findings Past Medical History:   Diverticulosis of colon (without mention of he*              Restless legs syndrome (RLS)                                 Anxiety state, unspecified                                   Hx of gout                                                     Comment:but doesn't take any meds   Bruises easily                                                 Comment:pt is on Coumadin   Cancer                                                         Comment:SKIN CANCER REMOVED   Unspecified essential hypertension                             Comment:takes Lisinopril daily   Depressive disorder, not  elsewhere classified                  Comment:takes Prozac daily   History of blood clots                                         Comment:behind right knee and then went into left lung  90yrs ago   Peripheral edema                                               Comment:takes Furosemide daily   Unspecified asthma(493.90)                                     Comment:as a child   Pneumonia                                                      Comment:last time about 58yrs ago   Arthritis                                                    Joint pain                                                   History of colon polyps                                      History of kidney stones                                     Hard of hearing                                                Comment:wears hearing aids   Chronic atrial fibrillation                                    Comment:a. on warfarin   Peripheral vascular disease                                  Pulmonary embolism                                             Comment:over 10 yrs ago "many years ago"   ED (erectile dysfunction)                                    Hx of diabetes mellitus  Comment:"no longer diabetic since gastric bypass" -   Diabetes mellitus without complication                       Reproductive/Obstetrics negative OB ROS                             Anesthesia Physical Anesthesia Plan  ASA: III  Anesthesia Plan: General   Post-op Pain Management:    Induction:   Airway Management Planned:   Additional Equipment:   Intra-op Plan:   Post-operative Plan:   Informed Consent: I have reviewed the patients History and Physical, chart, labs and discussed the procedure including the risks, benefits and alternatives for the proposed anesthesia with the patient or authorized representative who has indicated his/her understanding and  acceptance.   Dental Advisory Given  Plan Discussed with: Anesthesiologist, CRNA and Surgeon  Anesthesia Plan Comments:         Anesthesia Quick Evaluation

## 2014-09-21 NOTE — Op Note (Signed)
[facility] Patient Name: [name] Procedure Date: 09/21/2014 8:07 AM MRN: [id number] Account #: [id number] Date of Birth: 1940-05-23 Admit Type: Outpatient Age: 74 Room: [facility] ENDO ROOM 4 Gender: Male Note Status: Finalized Procedure:         Upper GI endoscopy Indications:       Epigastric abdominal pain Providers:         Manya Silvas, MD Referring MD:      Jinny Sanders, MD (Referring MD) Medicines:         Propofol per Anesthesia Complications:     No immediate complications. Procedure:         Pre-Anesthesia Assessment:                    - After reviewing the risks and benefits, the patient was                     deemed in satisfactory condition to undergo the procedure.                    After obtaining informed consent, the endoscope was passed                     under direct vision. Throughout the procedure, the                     patient's blood pressure, pulse, and oxygen saturations                     were monitored continuously. The Endoscope was introduced                     through the mouth, and advanced to the afferent and                     efferent jejunal loops. The upper GI endoscopy was                     accomplished without difficulty. The patient tolerated the                     procedure well. Findings:      The examined esophagus was normal.      GEJ at 44cm.      Patchy mild inflammation characterized by erythema and granularity was       found in the gastric body. One patch near esophageal junction was       especially inflammed. Anastamotic junction looks great.      The examined jejunum was normal. Both afferent and efferent loops looked       good. Impression:        - Normal esophagus.                    - Gastritis.                    - Normal examined jejunum.                    - No specimens collected. Recommendation:    - The findings and recommendations were discussed with the                  patient's family.  check H. pylori blood test and give Carafate slurry qid                     for 2 months then tid. Manya Silvas, MD 09/21/2014 8:31:26 AM This report has been signed electronically. Number of Addenda: 0 Note Initiated On: 09/21/2014 8:07 AM      [facility]

## 2014-09-23 NOTE — Anesthesia Postprocedure Evaluation (Signed)
  Anesthesia Post-op Note  Patient: [name]  Procedure(s) Performed: Procedure(s): ESOPHAGOGASTRODUODENOSCOPY (EGD) WITH PROPOFOL (N/A)  Anesthesia type:General  Patient location: PACU  Post pain: Pain level controlled  Post assessment: Post-op Vital signs reviewed, Patient's Cardiovascular Status Stable, Respiratory Function Stable, Patent Airway and No signs of Nausea or vomiting  Post vital signs: Reviewed and stable  Last Vitals:  Filed Vitals:   09/21/14 0900  BP: 115/68  Pulse: 85  Temp:   Resp: 20    Level of consciousness: awake, alert  and patient cooperative  Complications: No apparent anesthesia complications

## 2014-09-24 ENCOUNTER — Telehealth: Payer: Self-pay

## 2014-09-24 DIAGNOSIS — M1711 Unilateral primary osteoarthritis, right knee: Secondary | ICD-10-CM | POA: Diagnosis not present

## 2014-09-24 LAB — H PYLORI, IGM, IGG, IGA AB
H Pylori IgG: <0.9 U/mL (ref 0.0–0.8)
H. Pylogi, Iga Abs: <9 units (ref 0.0–8.9)
H. Pylogi, Igm Abs: <9 units (ref 0.0–8.9)

## 2014-09-24 NOTE — Telephone Encounter (Signed)
S/w pt who states he is agreeable to lexi myoview Friday, August 19, 8:30am Pt states as of last week at endocrinologist OV, he was taken off metformin. Verbalized understanding of myoview. Instructions put in mail to pt. Pt understands to call with any questions.

## 2014-09-26 ENCOUNTER — Ambulatory Visit (INDEPENDENT_AMBULATORY_CARE_PROVIDER_SITE_OTHER): Payer: Medicare Other

## 2014-09-26 DIAGNOSIS — I4891 Unspecified atrial fibrillation: Secondary | ICD-10-CM | POA: Diagnosis not present

## 2014-09-26 DIAGNOSIS — Z5181 Encounter for therapeutic drug level monitoring: Secondary | ICD-10-CM

## 2014-09-26 DIAGNOSIS — Z7901 Long term (current) use of anticoagulants: Secondary | ICD-10-CM | POA: Diagnosis not present

## 2014-09-26 DIAGNOSIS — I824Y9 Acute embolism and thrombosis of unspecified deep veins of unspecified proximal lower extremity: Secondary | ICD-10-CM

## 2014-09-26 LAB — POCT INR: INR: 1.4

## 2014-09-27 DIAGNOSIS — H04123 Dry eye syndrome of bilateral lacrimal glands: Secondary | ICD-10-CM | POA: Diagnosis not present

## 2014-09-28 ENCOUNTER — Encounter
Admission: RE | Admit: 2014-09-28 | Discharge: 2014-09-28 | Disposition: A | Discharge status: Home / Self Care | Payer: Medicare Other | Source: Ambulatory Visit | Attending: Physician Assistant | Admitting: Physician Assistant

## 2014-09-28 DIAGNOSIS — R079 Chest pain, unspecified: Secondary | ICD-10-CM | POA: Diagnosis not present

## 2014-09-28 LAB — NM MYOCAR MULTI W/SPECT W/WALL MOTION / EF
CHL CUP NUCLEAR SSS: 4
CSEPHR: 52 %
CSEPPHR: 82 {beats}/min
Estimated workload: 1 METS
Exercise duration (min): 0 min
Exercise duration (sec): 0 s
LV dias vol: 107 mL
LVSYSVOL: 52 mL
MPHR: 155 {beats}/min
Rest HR: 67 {beats}/min
SDS: 2
SRS: 7
TID: 1.08

## 2014-09-28 MED ORDER — REGADENOSON 0.4 MG/5ML IV SOLN
0.4000 mg | Freq: Once | INTRAVENOUS | Status: AC
  Administered 2014-09-28: 0.4 mg via INTRAVENOUS
  Filled 2014-09-28: qty 5

## 2014-09-28 MED ORDER — TECHNETIUM TC 99M SESTAMIBI - CARDIOLITE
13.0000 | Freq: Once | INTRAVENOUS | Status: AC | PRN
  Administered 2014-09-28: 14.056 via INTRAVENOUS

## 2014-09-28 MED ORDER — TECHNETIUM TC 99M SESTAMIBI - CARDIOLITE
33.0000 | Freq: Once | INTRAVENOUS | Status: AC | PRN
  Administered 2014-09-28: 31.85 via INTRAVENOUS

## 2014-09-30 DIAGNOSIS — H04123 Dry eye syndrome of bilateral lacrimal glands: Secondary | ICD-10-CM | POA: Diagnosis not present

## 2014-10-01 ENCOUNTER — Encounter: Payer: Self-pay | Admitting: Family Medicine

## 2014-10-01 ENCOUNTER — Encounter: Payer: Medicare Other | Admitting: Gastroenterology

## 2014-10-01 ENCOUNTER — Ambulatory Visit (INDEPENDENT_AMBULATORY_CARE_PROVIDER_SITE_OTHER): Payer: Medicare Other | Admitting: Family Medicine

## 2014-10-01 VITALS — BP 90/60 | HR 70 | Temp 97.4°F | Ht 74.0 in | Wt 186.5 lb

## 2014-10-01 DIAGNOSIS — Z5181 Encounter for therapeutic drug level monitoring: Secondary | ICD-10-CM

## 2014-10-01 DIAGNOSIS — J349 Unspecified disorder of nose and nasal sinuses: Secondary | ICD-10-CM

## 2014-10-01 MED ORDER — AZITHROMYCIN 250 MG PO TABS: ORAL_TABLET | ORAL | Status: DC

## 2014-10-01 NOTE — Assessment & Plan Note (Signed)
Given 2 weeks of symptoms, failure of doxy and new fever. Will treat with antibiotics.  Also encouraged nasal saline irrigation . Can retry antihistamine and flonase for possible allergy component.

## 2014-10-01 NOTE — Patient Instructions (Addendum)
Continue mucinex.  Start and complete azithromycin.  Set up INR check in 2 days.  Call if shortness of breath, wheezing.

## 2014-10-01 NOTE — Assessment & Plan Note (Signed)
On antibiotics pt instructed to have ONR checked in 2 days. He reports he will have this done at cardiologist.

## 2014-10-01 NOTE — Progress Notes (Signed)
Subjective:    Patient ID: DEMONTREZ Mercado, male    DOB: 08-Feb-1941, 74 y.o.   MRN: [id number]  HPI    74 year old male with history of COPD, HTN, presents with new onset congestion, nasal x 2 weeks . He reports  Started as nasal congestion. NO face pain, no ear pain.  Now in last few days, chest congestion, chest tightness. No cough. No SOB, no wheeze.  Subjective fever last night, sweating, chills.   [name] has completed doxy course of 10 days.  Flonase and antihistmaine has not helped. No sneeze.  He has used mucinex DM. Helps minimally.   Nonsmoker   On antibitoic eye drops for eye infection and dry eye.   INR 1.4 on 09/26/14 on coumadin.   Social History   Social History  . Marital Status: Married    Spouse Name: N/A  . Number of Children: 1  . Years of Education: N/A   Occupational History  . Retired     Administrator   Social History Main Topics  . Smoking status: Never Smoker   . Smokeless tobacco: Never Used  . Alcohol Use: No  . Drug Use: No  . Sexual Activity: Yes   Other Topics Concern  . Not on file   Social History Narrative   DIET: 3 meals, F&V, some water, crystal light, No fast food   Exercise: walks treadmill 30 min      1 Caffeine drinks daily       No living will.             Review of Systems  Constitutional: Positive for fever and fatigue.  HENT: Negative for ear pain.   Eyes: Negative for pain.  Respiratory: Negative for cough, shortness of breath and wheezing.        Objective:   Physical Exam  Constitutional: Vital signs are normal. He appears well-developed and well-nourished.  Non-toxic appearance. He does not appear ill. No distress.  HENT:  Head: Normocephalic and atraumatic.  Right Ear: Hearing, tympanic membrane, external ear and ear canal normal. No tenderness. No foreign bodies. Tympanic membrane is not retracted and not bulging.  Left Ear: Hearing, tympanic membrane, external ear and ear canal normal. No  tenderness. No foreign bodies. Tympanic membrane is not retracted and not bulging.  Nose: Mucosal edema and rhinorrhea present. Right sinus exhibits no maxillary sinus tenderness and no frontal sinus tenderness. Left sinus exhibits no maxillary sinus tenderness and no frontal sinus tenderness.  Mouth/Throat: Uvula is midline, oropharynx is clear and moist and mucous membranes are normal. Normal dentition. No dental caries. No oropharyngeal exudate, posterior oropharyngeal edema or tonsillar abscesses.  Eyes: Conjunctivae, EOM and lids are normal. Pupils are equal, round, and reactive to light. Lids are everted and swept, no foreign bodies found.  Neck: Trachea normal, normal range of motion and phonation normal. Neck supple. Carotid bruit is not present. No thyroid mass and no thyromegaly present.  Cardiovascular: Normal rate, regular rhythm, S1 normal, S2 normal, normal heart sounds, intact distal pulses and normal pulses.  Exam reveals no gallop.   No murmur heard. Pulmonary/Chest: Effort normal and breath sounds normal. No respiratory distress. He has no wheezes. He has no rhonchi. He has no rales.  Abdominal: Soft. Normal appearance and bowel sounds are normal. There is no hepatosplenomegaly. There is no tenderness. There is no rebound, no guarding and no CVA tenderness. No hernia.  Neurological: He is alert. He has normal reflexes.  Skin:  Skin is warm, dry and intact. No rash noted.  Psychiatric: He has a normal mood and affect. His speech is normal and behavior is normal. Judgment normal.          Assessment & Plan:

## 2014-10-01 NOTE — Progress Notes (Signed)
Pre visit review using our clinic review tool, if applicable. No additional management support is needed unless otherwise documented below in the visit note. 

## 2014-10-02 ENCOUNTER — Encounter: Payer: Self-pay | Admitting: Family Medicine

## 2014-10-04 DIAGNOSIS — H04122 Dry eye syndrome of left lacrimal gland: Secondary | ICD-10-CM | POA: Diagnosis not present

## 2014-10-04 DIAGNOSIS — H04121 Dry eye syndrome of right lacrimal gland: Secondary | ICD-10-CM | POA: Diagnosis not present

## 2014-10-10 ENCOUNTER — Other Ambulatory Visit: Payer: Self-pay | Admitting: Family Medicine

## 2014-10-10 ENCOUNTER — Ambulatory Visit (INDEPENDENT_AMBULATORY_CARE_PROVIDER_SITE_OTHER): Payer: Medicare Other

## 2014-10-10 DIAGNOSIS — I4891 Unspecified atrial fibrillation: Secondary | ICD-10-CM

## 2014-10-10 DIAGNOSIS — Z5181 Encounter for therapeutic drug level monitoring: Secondary | ICD-10-CM

## 2014-10-10 DIAGNOSIS — Z7901 Long term (current) use of anticoagulants: Secondary | ICD-10-CM | POA: Diagnosis not present

## 2014-10-10 DIAGNOSIS — I824Y9 Acute embolism and thrombosis of unspecified deep veins of unspecified proximal lower extremity: Secondary | ICD-10-CM | POA: Diagnosis not present

## 2014-10-10 LAB — POCT INR: INR: 1.9

## 2014-10-10 NOTE — Telephone Encounter (Signed)
Last office visit 10/01/2014.  Last refilled 09/06/2014 for #30 with no refills.  Ok to refill?

## 2014-10-11 NOTE — Telephone Encounter (Signed)
Called into [facility]. 

## 2014-10-18 DIAGNOSIS — T83490A Other mechanical complication of penile (implanted) prosthesis, initial encounter: Secondary | ICD-10-CM | POA: Diagnosis not present

## 2014-10-22 DIAGNOSIS — M1711 Unilateral primary osteoarthritis, right knee: Secondary | ICD-10-CM | POA: Diagnosis not present

## 2014-10-30 DIAGNOSIS — T83490A Other mechanical complication of penile (implanted) prosthesis, initial encounter: Secondary | ICD-10-CM | POA: Diagnosis not present

## 2014-11-02 ENCOUNTER — Other Ambulatory Visit: Payer: Self-pay | Admitting: Urology

## 2014-11-02 ENCOUNTER — Encounter: Payer: Self-pay | Admitting: Neurology

## 2014-11-02 ENCOUNTER — Ambulatory Visit (INDEPENDENT_AMBULATORY_CARE_PROVIDER_SITE_OTHER): Payer: Medicare Other | Admitting: Neurology

## 2014-11-02 VITALS — BP 100/70 | HR 102 | Ht 74.0 in | Wt 188.4 lb

## 2014-11-02 DIAGNOSIS — R1314 Dysphagia, pharyngoesophageal phase: Secondary | ICD-10-CM

## 2014-11-02 DIAGNOSIS — G252 Other specified forms of tremor: Principal | ICD-10-CM

## 2014-11-02 DIAGNOSIS — R251 Tremor, unspecified: Secondary | ICD-10-CM

## 2014-11-02 DIAGNOSIS — G629 Polyneuropathy, unspecified: Secondary | ICD-10-CM

## 2014-11-02 DIAGNOSIS — E1142 Type 2 diabetes mellitus with diabetic polyneuropathy: Secondary | ICD-10-CM

## 2014-11-02 DIAGNOSIS — G250 Essential tremor: Secondary | ICD-10-CM

## 2014-11-02 DIAGNOSIS — E1342 Other specified diabetes mellitus with diabetic polyneuropathy: Secondary | ICD-10-CM | POA: Diagnosis not present

## 2014-11-02 NOTE — Progress Notes (Signed)
Subjective:    [name] was seen in consultation in the movement disorder clinic at the request of Dr. Joya Salm.  His PCP is Jonathan Lofts, MD.  This patient is accompanied in the office by his child who supplements the history. The evaluation is for tremor.  I reviewed prior records that are available to me.  Looking back in his medical record, there are notes back to 2012 that indicate severe essential tremor.  Pt reports tremor has been worse since recent surgeries.  He had a shoulder surgery for bone spurs first and then had a shoulder replacement on the L and then had a cervical fusion surgery.  Prior to that, he had gastric bypass in 2013.    The patient is a 74 y.o. right handed male with a history of tremor.  Pt reports that tremor started after an episode of sepsis about 5 years ago.  Once he left the hospital, it was in the bilateral UE, overall slight but it has gotten worse over 8 months.  He cannot eat peas or the roll off the spoon.   There is no family hx of tremor.    Affected by caffeine:  No. Affected by alcohol: doesn't drink Affected by stress:  No. Affected by fatigue:  No. Spills soup if on spoon:  Yes.   Spills glass of liquid if full:  No. Affects ADL's (tying shoes, brushing teeth, etc):  No.  Pt had neck hematomas after surgery, which caused dysphagia.  A modified barium swallow was done on 08/14/2013 indicate severe cervical face dysphagia, moderate to severe pharyngeal phase dysphagia.  There is recommended if crush his medications or takes them in liquid form.  Outpatient speech language pathology and nutrition consult was also recommended.  11/01/13 update:  Pt is returning for f/u.  Started primidone last visit.   Pt states that it has been helpful but thinks that we could go up on the medication.  No SE.  Had carpal tunnel release on the L.  States that it didn't help.  Has also gone to speech therapy for swallowing, as recommended and that did not  help.  01/02/14 update:  The patient returns for follow-up today.  He has a history of essential tremor.  Last visit, his primidone was increased 50 mg twice a day.  "I think that if you increased it one more time, we will have it just right."  I got a note from his ENT physician at [facility].  They agreed that dysphagia was related to prior cervical spine surgery.  They told him that loss of balance is not due to an inner ear pathology and he was told to follow-up here.  He does have a history of diabetic peripheral neuropathy.  Pt states that balance has been off since the neck surgery and thinks that it is related.  He states that it actually has gotten slowly better.  Balance isn't great but he isn't falling anymore either which used to be a big problem.  05/02/14 update:  His primidone was increased last visit so that he is taking 100 mg in the morning and he remains on 50 mg at night.  The patient states that tremor has tremor is doing well.  He states that tremor is well controlled.  He only spills something or notices tremor 1-2 days a month, which is markedly improved.  No SE with the tremor.    11/02/14 update:  The patient has a history of essential tremor  and is on primidone, 100 mg in the morning and 50 mg at night. He thinks that tremor is well controlled.   I reviewed records since our last visit.  He has undergone a rather extensive workup for epigastric pain and weight loss.  He is a CT of the abdomen that was negative.  His H. pylori antibodies were negative.  He had a stress Myoview and while there was some evidence of ischemia, the patient did not believe that this was cardiac related and did not want to proceed with a cath.  He has seen gastroenterology and has had an EGD.  He was started on carafate and he thinks that it is helping.  He thinks that much of it is from the gastric bypass as he has emesis after the meals.    Current/Previously tried tremor medications: no  Current  medications that may exacerbate tremor:  n/a  Outside reports reviewed: historical medical records, lab reports and referral letter/letters.  Allergies  Allergen Reactions  . Sulfacetamide Sodium     REACTION: Throat swelling  . Adhesive [Tape]     Tears skin-- "No tape of any kind, they all tear skin right off"    Current Outpatient Prescriptions on File Prior to Visit  Medication Sig Dispense Refill  . acetaminophen (TYLENOL) 500 MG tablet Take 1,000 mg by mouth every 6 (six) hours as needed for moderate pain or headache.    [name] atorvastatin (LIPITOR) 80 MG tablet Take 1 tablet by mouth at  bedtime 90 tablet 3  . azithromycin (ZITHROMAX) 250 MG tablet 2 tab po x 1 day then 1 tab po daily 6 tablet 0  . FLUoxetine (PROZAC) 40 MG capsule TAKE 1 CAPSULE (40 MG TOTAL) BY MOUTH DAILY. 90 capsule 1  . furosemide (LASIX) 20 MG tablet Take 1 to 2 tablets by  mouth daily 180 tablet 1  . gabapentin (NEURONTIN) 300 MG capsule Take 300 mg by mouth at bedtime.     [name] lisinopril (PRINIVIL,ZESTRIL) 2.5 MG tablet Take 2.5 mg by mouth every morning.     [name] omeprazole (PRILOSEC) 40 MG capsule Take 1 capsule by mouth  daily 30 capsule 11  . pantoprazole (PROTONIX) 40 MG tablet Take 1 tablet (40 mg total) by mouth 2 (two) times daily. 60 tablet 3  . PRESCRIPTION MEDICATION Apply 1 application topically at bedtime. Compound with cetaphil for dry skin.    [name] primidone (MYSOLINE) 50 MG tablet TAKE 2 TABLETS BY MOUTH IN THE MORNING, 1 TABLET IN THE EVENING 270 tablet 1  . silodosin (RAPAFLO) 8 MG CAPS capsule Take 1 capsule by mouth every morning.    . sucralfate (CARAFATE) 1 GM/10ML suspension Take 10 mLs (1 g total) by mouth 4 (four) times daily -  with meals and at bedtime. 420 mL 0  . warfarin (COUMADIN) 2.5 MG tablet Take 1 to 1.5 tablets by mouth daily or as directed by coumadin clinic 105 tablet 1  . zolpidem (AMBIEN) 10 MG tablet TAKE 1/2 TO 1 TABLET BY MOUTH AT BEDTIME 30 tablet 0   No current  facility-administered medications on file prior to visit.    Past Medical History  Diagnosis Date  . Diverticulosis of colon (without mention of hemorrhage)   . Restless legs syndrome (RLS)   . Anxiety state, unspecified   . Hx of gout     but doesn't take any meds  . Bruises easily     pt is on Coumadin  . Cancer  SKIN CANCER REMOVED  . Unspecified essential hypertension     takes Lisinopril daily  . Depressive disorder, not elsewhere classified     takes Prozac daily  . History of blood clots     behind right knee and then went into left lung 26yrs ago  . Peripheral edema     takes Furosemide daily  . Unspecified asthma(493.90)     as a child  . Pneumonia     last time about 72yrs ago  . Arthritis   . Joint pain   . History of colon polyps   . History of kidney stones   . Hard of hearing     wears hearing aids  . Chronic atrial fibrillation     a. on warfarin  . Peripheral vascular disease   . Pulmonary embolism     over 10 yrs ago "many years ago"  . ED (erectile dysfunction)   . Hx of diabetes mellitus     "no longer diabetic since gastric bypass" -  . Diabetes mellitus without complication     Past Surgical History  Procedure Laterality Date  . Knee surgery Right     x 4  . Artery repair      Left forearm  . Foot surgery      Left foot   . Replacement total knee Left     x 2  . Breath tek h pylori  01/08/2011    Procedure: BREATH TEK H PYLORI;  Surgeon: Pedro Earls, MD;  Location: [facility];  Service: General;  Laterality: N/A;  . Hand surgery      LEFT  . Leg surgery      FOR NECROTIZING FASCITIS L LEG AND GROIN  . 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 Shoulder arthroscopy    . Shoulder surgery Left 2014  . Back surgery      x 3  . Knee surgery Left     arthroscopy  . Eye surgery       cataract bil  . Injections in back      x 18  . Colonoscopy    . Cystoscopy    . Total shoulder arthroplasty Left 01/26/2013    Procedure: TOTAL SHOULDER ARTHROPLASTY;  Surgeon: Nita Sells, MD;  Location: [facility];  Service: Orthopedics;  Laterality: Left;  Left total shoulder arthroplasty  . Anterior cervical decomp/discectomy fusion N/A 05/09/2013    Procedure: ANTERIOR CERVICAL DECOMPRESSION/DISCECTOMY FUSION CERVICAL THREE-FOUR,CERVICAL FOUR-FIVE,CERVICAL FIVE-SIX;  Surgeon: Floyce Stakes, MD;  Location: [facility];  Service: Neurosurgery;  Laterality: N/A;  . Penile prosthesis implant N/A 07/02/2014    Procedure: PENILE PROTHESIS INFLATABLE 3 PIECE (COLOPLAST) SCROTAL APPROACH;  Surgeon: Kathie Rhodes, MD;  Location: [facility];  Service: Urology;  Laterality: N/A;  . Esophagogastroduodenoscopy (egd) with propofol N/A 09/21/2014    Procedure: ESOPHAGOGASTRODUODENOSCOPY (EGD) WITH PROPOFOL;  Surgeon: Manya Silvas, MD;  Location: [facility];  Service: Endoscopy;  Laterality: N/A;    Social History   Social History  . Marital Status: Married    Spouse Name: N/A  . Number of Children: 1  . Years of Education: N/A   Occupational History  . Retired     Administrator   Social History Main Topics  . Smoking status: Never Smoker   . Smokeless tobacco: Never Used  .  Alcohol Use: No  . Drug Use: No  . Sexual Activity: Yes   Other Topics Concern  . Not on file   Social History Narrative   DIET: 3 meals, F&V, some water, crystal light, No fast food   Exercise: walks treadmill 30 min      1 Caffeine drinks daily       No living will.           Family Status  Relation Status Death Age  . Mother Deceased     breast/uterine cancer  . Father Deceased     emphysema, heart disease  . Sister Deceased     alzheimer's disease  . Brother Deceased     struck by lightening  . Brother Deceased     colon cancer  . Brother Deceased     heart disease  . Brother  Alive     prostate cancer  . Daughter Alive     healthy    Review of Systems Continues with knee pain and just had another cortisone injection on Friday.  A complete 10 system ROS was obtained and was negative apart from what is mentioned.   Objective:   VITALS:   Filed Vitals:   11/02/14 0909  BP: 100/70  Pulse: 102  Height: 6\' 2"  (1.88 m)  Weight: 188 lb 6 oz (85.446 kg)  SpO2: 97%   Wt Readings from Last 3 Encounters:  11/02/14 188 lb 6 oz (85.446 kg)  10/01/14 186 lb 8 oz (84.596 kg)  09/21/14 176 lb (79.833 kg)     Gen:  Appears stated age and in NAD. HEENT:  Normocephalic, atraumatic. The mucous membranes are moist. The superficial temporal arteries are without ropiness or tenderness. CV:  RRR Lungs: CTAB Neck:  No bruits.    NEUROLOGICAL:  Orientation:  The patient is alert and oriented x 3.  Recent and remote memory are intact.  Attention span and concentration are normal.  Able to name objects and repeat without trouble.  Fund of knowledge is appropriate Cranial nerves: There is good facial symmetry, but there is L ptosis (I looked at [id number] drivers license and it was the same). Extraocular muscles are intact and visual fields are full to confrontational testing. Speech is fluent and clear. Soft palate rises symmetrically and there is no tongue deviation. Hearing is intact to conversational tone. Tone: Tone is good throughout.  Mild gegenhalten. Sensation: Sensation is intact to light touch throughout. Coordination:  The patient has no dysdiadichokinesia or dysmetria. Motor: Strength is 5/5 in the bilateral upper and lower extremities.  Shoulder shrug is equal bilaterally.  There is no pronator drift.   Gait and Station: The patient ambulates well today  MOVEMENT EXAM: Tremor:  There is minimal tremor of the outstretched hands.  Minimal tremor can be seen/felt at rest  LABS    Chemistry      Component Value Date/Time   NA 138 08/08/2014 1454   K 3.7  08/08/2014 1454   CL 103 08/08/2014 1454   CO2 23 08/08/2014 1454   BUN 21 08/08/2014 1454   CREATININE 1.50 08/08/2014 1454      Component Value Date/Time   CALCIUM 9.4 08/08/2014 1454   ALKPHOS 64 08/08/2014 1454   AST 25 08/08/2014 1454   ALT 24 08/08/2014 1454   BILITOT 0.4 08/08/2014 1454          Assessment/Plan:   1.  Essential Tremor.  -improved on primidone.  Continue with two in the AM (  100 mg) and continue 50 mg at night.  Risks, benefits, side effects and alternative therapies were discussed.  The opportunity to ask questions was given and they were answered to the best of my ability.  The patient expressed understanding and willingness to follow the outlined treatment protocols. 2.  Gait instability  -It is believed that ET alone can cause ataxia/balance changes due to changes in the cerebellar purkinje cell/climbing fiber synaptic transmission.   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  -looked better in this regard today 3.  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  Pt wanted lid lift but ophthalmology didn't want to proceed  5.  Return in about 4 months (around 03/04/2015).  Greater than 50% of 25 min visit in counseling re: safety.

## 2014-11-05 ENCOUNTER — Other Ambulatory Visit: Payer: Self-pay | Admitting: Family Medicine

## 2014-11-06 ENCOUNTER — Other Ambulatory Visit: Payer: Self-pay | Admitting: Family Medicine

## 2014-11-06 NOTE — Telephone Encounter (Signed)
Last office visit 10/01/2014.  Last refilled 10/10/2014 for #30 with no refills. Ok to refill?

## 2014-11-07 ENCOUNTER — Ambulatory Visit (INDEPENDENT_AMBULATORY_CARE_PROVIDER_SITE_OTHER): Payer: Medicare Other

## 2014-11-07 DIAGNOSIS — I4891 Unspecified atrial fibrillation: Secondary | ICD-10-CM | POA: Diagnosis not present

## 2014-11-07 DIAGNOSIS — Z5181 Encounter for therapeutic drug level monitoring: Secondary | ICD-10-CM

## 2014-11-07 DIAGNOSIS — Z7901 Long term (current) use of anticoagulants: Secondary | ICD-10-CM

## 2014-11-07 DIAGNOSIS — I824Y9 Acute embolism and thrombosis of unspecified deep veins of unspecified proximal lower extremity: Secondary | ICD-10-CM | POA: Diagnosis not present

## 2014-11-07 LAB — POCT INR: INR: 2.7

## 2014-11-08 NOTE — Telephone Encounter (Signed)
Zolpidem called into [facility].

## 2014-11-09 ENCOUNTER — Telehealth: Payer: Self-pay | Admitting: *Deleted

## 2014-11-09 DIAGNOSIS — H04121 Dry eye syndrome of right lacrimal gland: Secondary | ICD-10-CM | POA: Diagnosis not present

## 2014-11-09 NOTE — Telephone Encounter (Signed)
Procedure: Exploration and Revision of penil Prosthesis  As Per Erasmo Downer, Pharmacist "Ok to hold Warfarin for 5 days prior to procedure, per protocol"   Clearance was faxed to [facility] Fax# [phone] and send via Epic

## 2014-11-13 NOTE — Patient Instructions (Addendum)
[name]  11/13/2014   Your procedure is scheduled on:   11/23/2014    Report to [facility] Main  Entrance take [place]  elevators to 3rd floor to  [place] at    [place] AM.  Call this number if you have problems the morning of surgery [phone]   Remember: ONLY 1 PERSON MAY GO WITH YOU TO SHORT STAY TO GET  READY MORNING OF [place].  Do not eat food or drink liquids :After Midnight.     Take these medicines the morning of surgery with A SIP OF WATER: prilosec, mysoline                                 You may not have any metal on your body including hair pins and              piercings  Do not wear jewelry, , lotions, powders or perfumes, deodorant                         Men may shave face and neck.   Do not bring valuables to the hospital. [place].  Contacts, dentures or bridgework may not be worn into surgery.  Leave suitcase in the car. After surgery it may be brought to your room.      Special Instructions: coughing and deep breathing exercises, leg exercises               Please read over the following fact sheets you were given: _____________________________________________________________________             [facility] - Preparing for Surgery Before surgery, you can play an important role.  Because skin is not sterile, your skin needs to be as free of germs as possible.  You can reduce the number of germs on your skin by washing with CHG (chlorahexidine gluconate) soap before surgery.  CHG is an antiseptic cleaner which kills germs and bonds with the skin to continue killing germs even after washing. Please DO NOT use if you have an allergy to CHG or antibacterial soaps.  If your skin becomes reddened/irritated stop using the CHG and inform your nurse when you arrive at Short Stay. Do not shave (including legs and underarms) for at least 48 hours prior to the first CHG  shower.  You may shave your face/neck. Please follow these instructions carefully:  1.  Shower with CHG Soap the night before surgery and the  morning of Surgery.  2.  If you choose to wash your hair, wash your hair first as usual with your  normal  shampoo.  3.  After you shampoo, rinse your hair and body thoroughly to remove the  shampoo.                           4.  Use CHG as you would any other liquid soap.  You can apply chg directly  to the skin and wash                       Gently with a scrungie or clean washcloth.  5.  Apply the CHG Soap to your body ONLY FROM THE NECK DOWN.   Do not use on face/ open                           Wound or open sores. Avoid contact with eyes, ears mouth and genitals (private parts).                       Wash face,  Genitals (private parts) with your normal soap.             6.  Wash thoroughly, paying special attention to the area where your surgery  will be performed.  7.  Thoroughly rinse your body with warm water from the neck down.  8.  DO NOT shower/wash with your normal soap after using and rinsing off  the CHG Soap.                9.  Pat yourself dry with a clean towel.            10.  Wear clean pajamas.            11.  Place clean sheets on your bed the night of your first shower and do not  sleep with pets. Day of Surgery : Do not apply any lotions/deodorants the morning of surgery.  Please wear clean clothes to the hospital/surgery center.  FAILURE TO FOLLOW THESE INSTRUCTIONS MAY RESULT IN THE CANCELLATION OF YOUR SURGERY PATIENT SIGNATURE_________________________________  NURSE SIGNATURE__________________________________  ________________________________________________________________________

## 2014-11-14 ENCOUNTER — Encounter (HOSPITAL_COMMUNITY): Payer: Self-pay

## 2014-11-14 ENCOUNTER — Encounter (HOSPITAL_COMMUNITY)
Admission: RE | Admit: 2014-11-14 | Discharge: 2014-11-14 | Disposition: A | Discharge status: Home / Self Care | Payer: Medicare Other | Source: Ambulatory Visit | Attending: Urology | Admitting: Urology

## 2014-11-14 DIAGNOSIS — Z01818 Encounter for other preprocedural examination: Secondary | ICD-10-CM | POA: Insufficient documentation

## 2014-11-14 HISTORY — DX: Gastro-esophageal reflux disease without esophagitis: K21.9

## 2014-11-14 LAB — CBC
HCT: 39.7 % (ref 39.0–52.0)
HEMOGLOBIN: 12.8 g/dL — AB (ref 13.0–17.0)
MCH: 31.8 pg (ref 26.0–34.0)
MCHC: 32.2 g/dL (ref 30.0–36.0)
MCV: 98.5 fL (ref 78.0–100.0)
Platelets: 204 10*3/uL (ref 150–400)
RBC: 4.03 MIL/uL — AB (ref 4.22–5.81)
RDW: 13.7 % (ref 11.5–15.5)
WBC: 7.3 10*3/uL (ref 4.0–10.5)

## 2014-11-14 LAB — BASIC METABOLIC PANEL
Anion gap: 6 (ref 5–15)
BUN: 22 mg/dL — ABNORMAL HIGH (ref 6–20)
CALCIUM: 8.8 mg/dL — AB (ref 8.9–10.3)
CO2: 26 mmol/L (ref 22–32)
CREATININE: 1.64 mg/dL — AB (ref 0.61–1.24)
Chloride: 108 mmol/L (ref 101–111)
GFR calc non Af Amer: 40 mL/min — ABNORMAL LOW (ref >60)
GFR, EST AFRICAN AMERICAN: 46 mL/min — AB (ref >60)
Glucose, Bld: 116 mg/dL — ABNORMAL HIGH (ref 65–99)
Potassium: 4.8 mmol/L (ref 3.5–5.1)
SODIUM: 140 mmol/L (ref 135–145)

## 2014-11-14 LAB — APTT: aPTT: 36 seconds (ref 24–37)

## 2014-11-14 LAB — PROTIME-INR
INR: 2.03 — AB (ref 0.00–1.49)
PROTHROMBIN TIME: 22.8 s — AB (ref 11.6–15.2)

## 2014-11-14 NOTE — Progress Notes (Signed)
EKG- 09/17/14- EPIC  09/28/14 - Stress Test - EPIC  Instructions for Coumadin under 11/09/14- telephone note  09/17/14- LOV- cardiology in EPIC

## 2014-11-14 NOTE — Progress Notes (Addendum)
CBC and BMP results faxed via EPIC to Dr Karsten Ro. Also Pt/INR result faxed via EPIC to Dr Karsten Ro.

## 2014-11-19 DIAGNOSIS — M1711 Unilateral primary osteoarthritis, right knee: Secondary | ICD-10-CM | POA: Diagnosis not present

## 2014-11-22 MED ORDER — GENTAMICIN SULFATE 40 MG/ML IJ SOLN
5.0000 mg/kg | INTRAVENOUS | Status: AC
  Administered 2014-11-23: 410 mg via INTRAVENOUS
  Filled 2014-11-22: qty 10.25

## 2014-11-22 NOTE — Anesthesia Preprocedure Evaluation (Addendum)
Anesthesia Evaluation  Patient identified by MRN, date of birth, ID band Patient awake    Reviewed: Allergy & Precautions, H&P , NPO status , Patient's Chart, lab work & pertinent test results  History of Anesthesia Complications Negative for: history of anesthetic complications  Airway Mallampati: I  TM Distance: >3 FB Neck ROM: full    Dental  (+) Edentulous Lower, Edentulous Upper   Pulmonary COPD,    Pulmonary exam normal breath sounds clear to auscultation       Cardiovascular hypertension, Pt. on medications + Peripheral Vascular Disease  + dysrhythmias Atrial Fibrillation  Rhythm:Irregular Rate:Normal     Neuro/Psych PSYCHIATRIC DISORDERS Anxiety Depression    GI/Hepatic Neg liver ROS, GERD  ,  Endo/Other  negative endocrine ROSdiabetes  Renal/GU Renal disease     Musculoskeletal   Abdominal   Peds  Hematology  (+) anemia ,   Anesthesia Other Findings Penile prosthesis Has taken coumadin for a-fib prophylaxis  RCRI for this procedure is 0.9%  Denies active cardiac condition or any heart failure symptoms  Reproductive/Obstetrics negative OB ROS                          Anesthesia Physical Anesthesia Plan  ASA: III  Anesthesia Plan: General   Post-op Pain Management:    Induction: Intravenous  Airway Management Planned: LMA  Additional Equipment:   Intra-op Plan:   Post-operative Plan:   Informed Consent: I have reviewed the patients History and Physical, chart, labs and discussed the procedure including the risks, benefits and alternatives for the proposed anesthesia with the patient or authorized representative who has indicated his/her understanding and acceptance.   Dental Advisory Given  Plan Discussed with: Anesthesiologist, CRNA and Surgeon  Anesthesia Plan Comments:        Anesthesia Quick Evaluation

## 2014-11-23 ENCOUNTER — Encounter (HOSPITAL_COMMUNITY): Payer: Self-pay | Admitting: *Deleted

## 2014-11-23 ENCOUNTER — Ambulatory Visit (HOSPITAL_COMMUNITY): Payer: Medicare Other | Admitting: Anesthesiology

## 2014-11-23 ENCOUNTER — Encounter (HOSPITAL_COMMUNITY)
Admission: RE | Disposition: A | Discharge status: Home / Self Care | Payer: Self-pay | Source: Ambulatory Visit | Attending: Urology

## 2014-11-23 ENCOUNTER — Ambulatory Visit (HOSPITAL_COMMUNITY)
Admission: RE | Admit: 2014-11-23 | Discharge: 2014-11-23 | Disposition: A | Discharge status: Home / Self Care | Payer: Medicare Other | Source: Ambulatory Visit | Attending: Urology | Admitting: Urology

## 2014-11-23 DIAGNOSIS — I129 Hypertensive chronic kidney disease with stage 1 through stage 4 chronic kidney disease, or unspecified chronic kidney disease: Secondary | ICD-10-CM | POA: Diagnosis not present

## 2014-11-23 DIAGNOSIS — F329 Major depressive disorder, single episode, unspecified: Secondary | ICD-10-CM | POA: Diagnosis not present

## 2014-11-23 DIAGNOSIS — J449 Chronic obstructive pulmonary disease, unspecified: Secondary | ICD-10-CM | POA: Diagnosis not present

## 2014-11-23 DIAGNOSIS — Z4589 Encounter for adjustment and management of other implanted devices: Secondary | ICD-10-CM | POA: Diagnosis not present

## 2014-11-23 DIAGNOSIS — I4891 Unspecified atrial fibrillation: Secondary | ICD-10-CM | POA: Diagnosis not present

## 2014-11-23 DIAGNOSIS — M1990 Unspecified osteoarthritis, unspecified site: Secondary | ICD-10-CM | POA: Insufficient documentation

## 2014-11-23 DIAGNOSIS — Z888 Allergy status to other drugs, medicaments and biological substances status: Secondary | ICD-10-CM | POA: Diagnosis not present

## 2014-11-23 DIAGNOSIS — K219 Gastro-esophageal reflux disease without esophagitis: Secondary | ICD-10-CM | POA: Diagnosis not present

## 2014-11-23 DIAGNOSIS — Y838 Other surgical procedures as the cause of abnormal reaction of the patient, or of later complication, without mention of misadventure at the time of the procedure: Secondary | ICD-10-CM | POA: Diagnosis not present

## 2014-11-23 DIAGNOSIS — T83490A Other mechanical complication of penile (implanted) prosthesis, initial encounter: Secondary | ICD-10-CM | POA: Diagnosis not present

## 2014-11-23 DIAGNOSIS — N281 Cyst of kidney, acquired: Secondary | ICD-10-CM | POA: Diagnosis not present

## 2014-11-23 DIAGNOSIS — N189 Chronic kidney disease, unspecified: Secondary | ICD-10-CM | POA: Diagnosis not present

## 2014-11-23 DIAGNOSIS — J45909 Unspecified asthma, uncomplicated: Secondary | ICD-10-CM | POA: Insufficient documentation

## 2014-11-23 DIAGNOSIS — R351 Nocturia: Secondary | ICD-10-CM | POA: Diagnosis not present

## 2014-11-23 DIAGNOSIS — I739 Peripheral vascular disease, unspecified: Secondary | ICD-10-CM | POA: Insufficient documentation

## 2014-11-23 DIAGNOSIS — I10 Essential (primary) hypertension: Secondary | ICD-10-CM | POA: Diagnosis not present

## 2014-11-23 DIAGNOSIS — R3912 Poor urinary stream: Secondary | ICD-10-CM | POA: Diagnosis not present

## 2014-11-23 DIAGNOSIS — Y733 Surgical instruments, materials and gastroenterology and urology devices (including sutures) associated with adverse incidents: Secondary | ICD-10-CM | POA: Insufficient documentation

## 2014-11-23 DIAGNOSIS — Z86711 Personal history of pulmonary embolism: Secondary | ICD-10-CM | POA: Insufficient documentation

## 2014-11-23 DIAGNOSIS — Z7901 Long term (current) use of anticoagulants: Secondary | ICD-10-CM | POA: Insufficient documentation

## 2014-11-23 DIAGNOSIS — E7800 Pure hypercholesterolemia, unspecified: Secondary | ICD-10-CM | POA: Diagnosis not present

## 2014-11-23 DIAGNOSIS — F419 Anxiety disorder, unspecified: Secondary | ICD-10-CM | POA: Insufficient documentation

## 2014-11-23 DIAGNOSIS — N401 Enlarged prostate with lower urinary tract symptoms: Secondary | ICD-10-CM | POA: Diagnosis not present

## 2014-11-23 DIAGNOSIS — Z79899 Other long term (current) drug therapy: Secondary | ICD-10-CM | POA: Insufficient documentation

## 2014-11-23 DIAGNOSIS — E119 Type 2 diabetes mellitus without complications: Secondary | ICD-10-CM | POA: Diagnosis not present

## 2014-11-23 DIAGNOSIS — N529 Male erectile dysfunction, unspecified: Secondary | ICD-10-CM | POA: Diagnosis not present

## 2014-11-23 DIAGNOSIS — Z882 Allergy status to sulfonamides status: Secondary | ICD-10-CM | POA: Diagnosis not present

## 2014-11-23 DIAGNOSIS — B009 Herpesviral infection, unspecified: Secondary | ICD-10-CM | POA: Diagnosis not present

## 2014-11-23 DIAGNOSIS — T83490D Other mechanical complication of penile (implanted) prosthesis, subsequent encounter: Secondary | ICD-10-CM

## 2014-11-23 DIAGNOSIS — Y9289 Other specified places as the place of occurrence of the external cause: Secondary | ICD-10-CM | POA: Diagnosis not present

## 2014-11-23 DIAGNOSIS — Z87442 Personal history of urinary calculi: Secondary | ICD-10-CM | POA: Diagnosis not present

## 2014-11-23 HISTORY — PX: PENILE PROSTHESIS IMPLANT: SHX240

## 2014-11-23 LAB — GLUCOSE, CAPILLARY
Glucose-Capillary: 168 mg/dL — ABNORMAL HIGH (ref 65–99)
Glucose-Capillary: 134 mg/dL — ABNORMAL HIGH (ref 65–99)

## 2014-11-23 LAB — PROTIME-INR
INR: 1.21 (ref 0.00–1.49)
PROTHROMBIN TIME: 15.5 s — AB (ref 11.6–15.2)

## 2014-11-23 SURGERY — INSERTION, PENILE PROSTHESIS
Anesthesia: General

## 2014-11-23 MED ORDER — PROPOFOL 10 MG/ML IV BOLUS
INTRAVENOUS | Status: AC
  Filled 2014-11-23: qty 20

## 2014-11-23 MED ORDER — ACETAMINOPHEN 10 MG/ML IV SOLN
INTRAVENOUS | Status: DC | PRN
  Administered 2014-11-23: 1000 mg via INTRAVENOUS

## 2014-11-23 MED ORDER — ONDANSETRON HCL 4 MG/2ML IJ SOLN
INTRAMUSCULAR | Status: DC | PRN
  Administered 2014-11-23: 4 mg via INTRAVENOUS

## 2014-11-23 MED ORDER — LACTATED RINGERS IV SOLN
INTRAVENOUS | Status: DC | PRN
  Administered 2014-11-23 (×3): via INTRAVENOUS

## 2014-11-23 MED ORDER — ONDANSETRON HCL 4 MG/2ML IJ SOLN: 4.0000 mg | Freq: Once | INTRAMUSCULAR | Status: DC | PRN

## 2014-11-23 MED ORDER — LIDOCAINE HCL (CARDIAC) 20 MG/ML IV SOLN
INTRAVENOUS | Status: DC | PRN
  Administered 2014-11-23: 80 mg via INTRAVENOUS

## 2014-11-23 MED ORDER — PROPOFOL 10 MG/ML IV BOLUS
INTRAVENOUS | Status: DC | PRN
  Administered 2014-11-23: 160 mg via INTRAVENOUS

## 2014-11-23 MED ORDER — SODIUM CHLORIDE 0.9 % IR SOLN
Status: DC | PRN
  Administered 2014-11-23: 500 mL

## 2014-11-23 MED ORDER — FENTANYL CITRATE (PF) 250 MCG/5ML IJ SOLN
INTRAMUSCULAR | Status: AC
  Filled 2014-11-23: qty 25

## 2014-11-23 MED ORDER — CIPROFLOXACIN HCL 500 MG PO TABS: 500.0000 mg | ORAL_TABLET | Freq: Two times a day (BID) | ORAL | Status: DC

## 2014-11-23 MED ORDER — ONDANSETRON HCL 4 MG/2ML IJ SOLN
INTRAMUSCULAR | Status: AC
  Filled 2014-11-23: qty 2

## 2014-11-23 MED ORDER — ACETAMINOPHEN 10 MG/ML IV SOLN
INTRAVENOUS | Status: AC
  Filled 2014-11-23: qty 100

## 2014-11-23 MED ORDER — CEFAZOLIN SODIUM-DEXTROSE 2-3 GM-% IV SOLR
INTRAVENOUS | Status: AC
  Filled 2014-11-23: qty 50

## 2014-11-23 MED ORDER — KETOROLAC TROMETHAMINE 30 MG/ML IJ SOLN
INTRAMUSCULAR | Status: AC
  Filled 2014-11-23: qty 1

## 2014-11-23 MED ORDER — KETOROLAC TROMETHAMINE 30 MG/ML IJ SOLN
INTRAMUSCULAR | Status: DC | PRN
  Administered 2014-11-23: 30 mg via INTRAVENOUS

## 2014-11-23 MED ORDER — FENTANYL CITRATE (PF) 100 MCG/2ML IJ SOLN: 25.0000 ug | INTRAMUSCULAR | Status: DC | PRN

## 2014-11-23 MED ORDER — FENTANYL CITRATE (PF) 100 MCG/2ML IJ SOLN
INTRAMUSCULAR | Status: DC | PRN
  Administered 2014-11-23 (×12): 12.5 ug via INTRAVENOUS

## 2014-11-23 MED ORDER — CEFAZOLIN SODIUM-DEXTROSE 2-3 GM-% IV SOLR
2.0000 g | INTRAVENOUS | Status: AC
  Administered 2014-11-23: 2 g via INTRAVENOUS

## 2014-11-23 MED ORDER — SODIUM CHLORIDE 0.9 % IR SOLN
Status: AC
  Filled 2014-11-23: qty 1

## 2014-11-23 MED ORDER — OXYCODONE HCL 10 MG PO TABS: 10.0000 mg | ORAL_TABLET | ORAL | Status: DC | PRN

## 2014-11-23 MED ORDER — LIDOCAINE HCL (CARDIAC) 20 MG/ML IV SOLN
INTRAVENOUS | Status: AC
  Filled 2014-11-23: qty 5

## 2014-11-23 SURGICAL SUPPLY — 50 items
ADH SKN CLS APL DERMABOND .7 (GAUZE/BANDAGES/DRESSINGS) ×1
APL SKNCLS STERI-STRIP NONHPOA (GAUZE/BANDAGES/DRESSINGS) ×1
BAG URINE DRAINAGE (UROLOGICAL SUPPLIES) ×1 IMPLANT
BANDAGE COBAN STERILE 2 (GAUZE/BANDAGES/DRESSINGS) IMPLANT
BENZOIN TINCTURE PRP APPL 2/3 (GAUZE/BANDAGES/DRESSINGS) ×2 IMPLANT
BLADE HEX COATED 2.75 (ELECTRODE) ×2 IMPLANT
BLADE SURG 15 STRL LF DISP TIS (BLADE) ×2 IMPLANT
BLADE SURG 15 STRL SS (BLADE) ×4
BNDG GAUZE ELAST 4 BULKY (GAUZE/BANDAGES/DRESSINGS) ×2 IMPLANT
CATH FOLEY 2WAY SLVR  5CC 16FR (CATHETERS) ×1
CATH FOLEY 2WAY SLVR 5CC 16FR (CATHETERS) ×1 IMPLANT
CHLORAPREP W/TINT 26ML (MISCELLANEOUS) ×2 IMPLANT
COVER MAYO STAND STRL (DRAPES) ×2 IMPLANT
COVER SURGICAL LIGHT HANDLE (MISCELLANEOUS) ×2 IMPLANT
DERMABOND ADVANCED (GAUZE/BANDAGES/DRESSINGS) ×1
DERMABOND ADVANCED .7 DNX12 (GAUZE/BANDAGES/DRESSINGS) IMPLANT
DISSECTOR ROUND CHERRY 3/8 STR (MISCELLANEOUS) ×2 IMPLANT
DRAPE EXTREMITY T 121X128X90 (DRAPE) ×1 IMPLANT
DRAPE LAPAROTOMY T 102X78X121 (DRAPES) ×2 IMPLANT
DRSG TEGADERM 4X4.75 (GAUZE/BANDAGES/DRESSINGS) ×1 IMPLANT
ELECT REM PT RETURN 9FT ADLT (ELECTROSURGICAL) ×2
ELECTRODE REM PT RTRN 9FT ADLT (ELECTROSURGICAL) ×1 IMPLANT
GAUZE SPONGE 4X4 12PLY STRL (GAUZE/BANDAGES/DRESSINGS) IMPLANT
GLOVE BIOGEL M 8.0 STRL (GLOVE) ×2 IMPLANT
GOWN STRL REUS W/TWL XL LVL3 (GOWN DISPOSABLE) ×2 IMPLANT
KIT ASSEMBLY MENTOR (KITS) ×1 IMPLANT
KIT BASIN OR (CUSTOM PROCEDURE TRAY) ×2 IMPLANT
NS IRRIG 1000ML POUR BTL (IV SOLUTION) ×1 IMPLANT
PACK GENERAL/GYN (CUSTOM PROCEDURE TRAY) ×2 IMPLANT
PEN SKIN MARKING BROAD (MISCELLANEOUS) ×2 IMPLANT
PLUG CATH AND CAP STER (CATHETERS) ×2 IMPLANT
PROSTHESIS TESTICULAR NAC LRG (Urological Implant) IMPLANT
RESERVOIR 75CC LOCKOUT BIOFLEX (Erectile Restoration) ×1 IMPLANT
RETRACTOR WILSON SYSTEM (INSTRUMENTS) IMPLANT
SCRUB PCMX 4 OZ (MISCELLANEOUS) ×2 IMPLANT
SOL PREP POV-IOD 4OZ 10% (MISCELLANEOUS) ×2 IMPLANT
SOL PREP PROV IODINE SCRUB 4OZ (MISCELLANEOUS) ×2 IMPLANT
SPONGE LAP 4X18 X RAY DECT (DISPOSABLE) IMPLANT
STRIP CLOSURE SKIN 1/2X4 (GAUZE/BANDAGES/DRESSINGS) ×1 IMPLANT
SUPPORT SCROTAL LG STRP (MISCELLANEOUS) ×2 IMPLANT
SURGILUBE 3G PEEL PACK STRL (MISCELLANEOUS) ×10 IMPLANT
SUT CHROMIC 3 0 SH 27 (SUTURE) ×4 IMPLANT
SUT MNCRL AB 4-0 PS2 18 (SUTURE) ×3 IMPLANT
SUT PDS AB 2-0 CT2 27 (SUTURE) ×8 IMPLANT
SUT VIC AB 2-0 UR6 27 (SUTURE) ×4 IMPLANT
SYR 20CC LL (SYRINGE) ×2 IMPLANT
SYR 50ML LL SCALE MARK (SYRINGE) ×4 IMPLANT
TESTICULAR PROSTHESIS NAC LRG (Urological Implant) ×2 IMPLANT
TOWEL OR 17X26 10 PK STRL BLUE (TOWEL DISPOSABLE) ×2 IMPLANT
WATER STERILE IRR 1500ML POUR (IV SOLUTION) ×2 IMPLANT

## 2014-11-23 NOTE — Op Note (Signed)
PATIENT:  [name]  PRE-OPERATIVE DIAGNOSIS: Malfunctioning penile prosthesis  POST-OPERATIVE DIAGNOSIS: Malfunctioning penile prosthesis reservoir  PROCEDURE: 1. Right inguinal exploration 2. Removal of prosthesis reservoir 3. Insertion of new prosthesis reservoir  SURGEON:  Claybon Jabs  INDICATION: [name] is a 74 year old male who underwent inflatable penile prosthesis implantation due to erectile dysfunction on 07/02/14. He then followed up with me in the office and the prosthesis appeared to be working properly but he came back to times having noted that the cylinders were spontaneously partially reinflating after about 2 hours. We discussed the fact that I felt that this was most likely an issue with the reservoir and we discussed the options. He elected to proceed with surgical management.  ANESTHESIA:  General  EBL:  Minimal  DRAINS: None  LOCAL MEDICATIONS USED:  None  SPECIMEN:  The explanted reservoir was sent to pathology and then will be sent back to [facility] for further evaluation.  Description of procedure: After informed consent the patient was taken to the operating room and placed on the table in a supine position. General anesthesia was then administered. Once fully anesthetized the genitalia and lower abdomen were sterilely prepped and draped in standard fashion. An official timeout was then performed.  I inserted a 7 French Foley catheter and drain the bladder. I then deflated the cylinders initially and then reinflated the cylinders completely. I was able to palpate the tubing where it exited the external inguinal ring on the right-hand side and made an incision in this location transversely. This was carried down to the external oblique fascia and identified the external inguinal ring. I was able to palpate the tubing where it exited the external inguinal ring medial to the spermatic cord. This was dissected free and the tubing was placed in a  [place]. I used the Bovie electrocautery to cut down on the reservoir medially and then was able to remove it from its pocket in the retropubic space. I then digitally broke the capsule that had formed which was very mild. Antibiotic irrigation was then instilled in this location.  With the prosthesis completely deflated I squeezed the reservoir in my hands as hard as I was physically able and was unable to force any fluid into the cylinders. Despite this fact based on the patient's history it was felt that the reservoir was the most likely cause of his spontaneous inflation and I therefore elected to replace it. I therefore placed shod hemostats on the tubing and divided the tubing.  Once I had the reservoir completely removed, with the reservoir completely full, I placed it on the back table and applied downward pressure as hard as I was able and was unable to cause any fluid to leak through the tubing. Interestingly though as I left the reservoir on the back table with no pressure whatsoever on the reservoir it began to leak. This was documented by the [employer] representative who made note of this as well. I repeated the pressure on the reservoir and again noted no leakage but once again with no pressure on the reservoir it spontaneously leaked fluid. This indicated the reservoir was faulty and the cause of his spontaneous reinflation.  I then chose a new 75 mL reservoir and placed this in the retropubic space and filled it with 70 mL of sterile saline. I then connected the 2 ends of the tubing with the supplied connectors and cycled the device. It appeared to cycle properly. I therefore closed the tissue  over the tubing and reconstituted the external inguinal ring with interrupted 2-0 Vicryl suture. The wound was irrigated copiously with antibiotic solution again and I then reapproximated the subcutaneous tissue with interrupted 3-0 Vicryl suture and closed the skin with running, subcuticular 4-0  Monocryl. Dermabond was then applied to the incision and a dressing was applied as well and the patient was awakened and taken to the recovery room in stable and satisfactory condition. He tolerated the procedure well no intraoperative complications.  PLAN OF CARE: Discharge to home after PACU  PATIENT DISPOSITION:  PACU - hemodynamically stable.

## 2014-11-23 NOTE — H&P (Signed)
Mr. Revelo is a 74 year old male with an IPP that initially become difficult to inflate.  History of Present Illness    Genital herpes: This was discovered in 8/84.    History of calculus disease: He has passed multiple stones but has never required any form of surgical intervention.  Stone analysis: 80% uric acid and 10% calcium oxalate.    Bilateral renal cysts: He was noted on CT scan done in 10/14 to have bilateral simple renal cysts that had remained remained stable.    LUTS: He has reported experiencing nocturia and a weak stream.  Current therapy: Rapaflo    Erectile dysfunction: This began in about 2005. He has tried Cialis, Viagra and Staxin as well as PGE intracavernosal injections without response.  Treatment: He underwent IPP placement on 07/02/14.    Interval history: When I saw him last he had indicated that he was pleased with his prosthesis and initially it was working well for him but then began to notice spontaneous inflation of the device that was occurring without abdominal straining. We discussed the options and I had recommended not to proceed with any form of surgical intervention since the device continued to function properly other than this minor inconvenience. He told me that since I have seen him last his device would no longer inflate and he had become frustrated. He said before it got to the point where he was not able to inflate the device he found that he was having a great deal of difficulty with spontaneous inflation. He said he wouldn't deflate the device completely and then within an hour it would begin to reinflate. He's not having any pain. He said about a week ago though he began having difficulty inflating the device.        Past Medical History Problems  1. History of Anxiety (F41.9) 2. History of Arthritis 3. History of Asthma (J45.909) 4. History of Atrial fibrillation (I48.91) 5. History of Calculus of left ureter (N20.1) 6.  History of Gross hematuria (R31.0) 7. History of depression (Z86.59) 8. History of diabetes mellitus (Z86.39) 9. History of esophageal reflux (Z87.19) 10. History of herpes simplex infection (Z86.19) 11. History of hypercholesterolemia (Z86.39) 12. History of hypertension (Z86.79) 13. History of kidney stones (Z87.442) 14. History of Hydronephrosis (N13.30) 15. History of Pulmonary Embolism 16. History of Urinary retention (R33.9) 17. 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 History of Back Surgery 2. History of Foot Repair 3. History of Hand Surgery 4. History of Knee Surgery 5. History of Neck Surgery 6. History of Shoulder Surgery 7. History of Shoulder Surgery Left 8. History of Surg Penis Insertion Of Penile Prosthesis  Current Meds 1. Atorvastatin Calcium 80 MG Oral Tablet;  Therapy: (Recorded:08Sep2016) to Recorded 2. Doxycycline Hyclate 100 MG Oral Capsule; TAKE 1 CAPSULE EVERY 12 HOURS UNTIL  GONE;  Therapy: [id number] to (Evaluate:23Sep2016)  Requested for: 08Sep2016; Last  Rx:08Sep2016 Ordered 3. FLUoxetine HCl - 40 MG Oral Capsule;  Therapy: (Recorded:08Sep2016) to Recorded 4. Furosemide 20 MG Oral Tablet;  Therapy: (Recorded:08Sep2016) to Recorded 5. Gabapentin 300 MG Oral Capsule;  Therapy: (Recorded:08Sep2016) to Recorded 6. Lisinopril 2.5 MG Oral Tablet;  Therapy: (Recorded:08Sep2016) to Recorded 7. Omeprazole 40 MG Oral Capsule Delayed Release;  Therapy: 20Jun2014 to Recorded 8. Primidone 50 MG Oral Tablet;  Therapy: (Recorded:08Sep2016) to Recorded 9. Rapaflo 8 MG Oral Capsule; TAKE 1 CAPSULE BY MOUTH DAILY;  Therapy: 10Oct2012 to (Evaluate:28Aug2017)  Requested for:  [id number]; Last  Rx:02Sep2016 Ordered 10. Warfarin Sodium 2.5 MG Oral Tablet;   Therapy: (Recorded:18Aug2014) to Recorded 11. Zolpidem Tartrate 10 MG Oral Tablet;   Therapy: (Recorded:08Sep2016) to  Recorded  Allergies Medication  1. Sulfa Drugs  Family History Problems  1. No pertinent family history : Mother  Social History Problems  1. Denied: History of Alcohol Use 2. Marital History - Currently Married 3. Never A Smoker 4. Occupation:   Retired Administrator. 5. Denied: History of Tobacco Use  Review of Systems Eye, otolaryngeal and pulmonary system(s) were reviewed and pertinent findings if present are noted.  Genitourinary: ED  Integumentary: skin rash/lesion and pruritus.  Hematologic/Lymphatic: a tendency to easily bruise.  Cardiovascular: leg swelling.  Endocrine: polydipsia.  Musculoskeletal: back pain and joint pain.  Neurological: headache and dizziness.  Psychiatric: anxiety and depression.    Vitals Vital Signs Blood Pressure  Blood Pressure: 130 / 84 Temperature  Temperature: 98.3 F Pulse  Heart Rate: 82  Physical Exam Constitutional: Well nourished and well developed. No acute distress. The patient appears well hydrated.  ENT:. The oropharynx is normal.  Neck: The appearance of the neck is normal and no neck mass is present. There is no jugular-venous distention.  Pulmonary: No respiratory distress, normal respiratory rhythm and effort and clear bilateral breath sounds.  Cardiovascular: Heart rate and rhythm are normal. The arterial pulses are normal. No peripheral edema. GU: Phallus reveals prosthesis in good position with cylinders equal distant.  Pump is located in the right hemiscrotum. I had first noted a great deal of resistance in attempting to pump the device but was able to cause the valve to open up and was then able to inflate and deflate the device easily. It appeared to be functioning properly and it would appear that the polyp itself is working as it is supposed to. There is no sign of infection.   Abdomen: The abdomen is obese. The abdomen is soft and nontender. No CVA tenderness. Bowel sounds are normal. No hernias are palpable. No  hepatosplenomegaly noted.  Skin: Normal skin turgor and normal skin color and pigmentation.  Neuro/Psych:. Mood and affect are appropriate.     Assessment  I was able to get his prosthesis functioning again and it inflates and deflates completely normally. The condition that he is experiencing though is no spontaneous reinflation of the device. This is enough of a bother to him that he would like to have it remedied. I discussed with him the fact that I think it's almost certainly an issue with the lockout valve on the reservoir although I'm not 100% sure that that is the case and would not be until I was able to explore him and evaluate the device further. We therefore discussed continued monitoring which she did not want to proceed with and surgery which is how he would like to proceed at this time. I told him that I would tentatively plan to make an incision in the groin. Because he had a difficult time with healing of the scrotum and I did tell him that it may be necessary to make a scrotal incision is well. We discussed outpatient nature of the procedure, its risks and complications including infection and I have answered all of his questions to his satisfaction. He understands and elected to proceed.   Plan  He is scheduled for exploration and repair of his prosthesis.

## 2014-11-23 NOTE — Discharge Instructions (Signed)
1. Activity:  You are encouraged to ambulate frequently (about every hour during waking hours) to help prevent blood clots from forming in your legs or lungs.  However, you should not engage in any heavy lifting (> 10-15 lbs), strenuous activity, or straining. 2. Diet: You should continue a clear liquid diet until passing gas from below if you are not doing so already.  Once this occurs, you may advance your diet to a soft diet that would be easy to digest (i.e soups, scrambled eggs, mashed potatoes, etc.) for 24 hours just as you would if getting over a bad stomach flu.  If tolerating this diet well for 24 hours, you may then begin eating regular food.  It will be normal to have some amount of bloating, nausea, and abdominal discomfort intermittently. 3. Prescriptions:  You will be provided a prescription for pain medication to take as needed.  If your pain is not severe enough to require the prescription pain medication, you may take extra strength Tylenol instead.  You should also take an over the counter stool softener (Colace 100 mg twice daily) to avoid straining with bowel movements as the pain medication may constipate you. Finally, you may also be provided a prescription for an antibiotic. 4. Catheter care: (If you are discharged with a catheter) You will be taught how to take care of the catheter by the nursing staff prior to discharge from the hospital.  You may use both a leg bag and the larger bedside bag but it is recommended to at least use the bigger bedside bag at nighttime as the leg bag is small and will fill up overnight and also does not drain as well when lying flat. You may periodically feel a strong urge to void with the catheter in place.  This is a bladder spasm and most often can occur when having a bowel movement or when you are moving around. It can be associated with a small amount of urine leaking out around the catheter. It is typically self-limited and usually will stop after a  few minutes.  You may use some Vaseline or Neosporin around the tip of the catheter to reduce friction at the tip of the penis. 5. Incisions: You may remove your dressing bandage the 2nd day after surgery.  Once the bandage is removed, the incisions may stay open to air.  You may start showering (not soaking or bathing in water) 48 hours after surgery and the incision simply needs to be patted dry after the shower.  No additional care is needed. You may choose to reapply a dry gauze dressing to prevent the incision from rubbing against your clothing. 6. What to call us about: You should call the office [phone]) if you develop fever > 101, persistent vomiting, or the catheter stops draining. Also, feel free to call with any other questions you may have.   General Anesthesia, Adult, Care After Refer to this sheet in the next few weeks. These instructions provide you with information on caring for yourself after your procedure. Your health care provider may also give you more specific instructions. Your treatment has been planned according to current medical practices, but problems sometimes occur. Call your health care provider if you have any problems or questions after your procedure. WHAT TO EXPECT AFTER THE PROCEDURE After the procedure, it is typical to experience:  Sleepiness.  Nausea and vomiting. HOME CARE INSTRUCTIONS  For the first 24 hours after general anesthesia:  Have a responsible person  with you.  Do not drive a car. If you are alone, do not take public transportation.  Do not drink alcohol.  Do not take medicine that has not been prescribed by your health care provider.  Do not sign important papers or make important decisions.  You may resume a normal diet and activities as directed by your health care provider.  Change bandages (dressings) as directed.  If you have questions or problems that seem related to general anesthesia, call the hospital and ask for the  anesthetist or anesthesiologist on call. SEEK MEDICAL CARE IF:  You have nausea and vomiting that continue the day after anesthesia.  You develop a rash. SEEK IMMEDIATE MEDICAL CARE IF:   You have difficulty breathing.  You have chest pain.  You have any allergic problems.   This information is not intended to replace advice given to you by your health care provider. Make sure you discuss any questions you have with your health care provider.   Document Released: 05/04/2000 Document Revised: 02/16/2014 Document Reviewed: 05/27/2011 Elsevier Interactive Patient Education [employer].

## 2014-11-23 NOTE — Transfer of Care (Signed)
Immediate Anesthesia Transfer of Care Note  Patient: [name]  Procedure(s) Performed: Procedure(s) (LRB): EXPLORATION AND REVISION OF PENILE PROSTHESIS (N/A)  Patient Location: PACU  Anesthesia Type: General  Level of Consciousness: awake, sedated, patient cooperative and responds to stimulation  Airway & Oxygen Therapy: Patient Spontanous Breathing and Patient connected to face mask oxygen  Post-op Assessment: Report given to PACU RN, Post -op Vital signs reviewed and stable and Patient moving all extremities  Post vital signs: Reviewed and stable  Complications: No apparent anesthesia complications

## 2014-11-23 NOTE — Anesthesia Procedure Notes (Signed)
Procedure Name: LMA Insertion Date/Time: 11/23/2014 7:43 AM Performed by: Justice Rocher Pre-anesthesia Checklist: Patient identified, Emergency Drugs available, Suction available and Patient being monitored Patient Re-evaluated:Patient Re-evaluated prior to inductionOxygen Delivery Method: Circle System Utilized Preoxygenation: Pre-oxygenation with 100% oxygen Intubation Type: IV induction Ventilation: Mask ventilation without difficulty LMA: LMA inserted LMA Size: 5.0 Number of attempts: 1 Airway Equipment and Method: Bite block Placement Confirmation: positive ETCO2 Tube secured with: Tape Dental Injury: Teeth and Oropharynx as per pre-operative assessment

## 2014-11-23 NOTE — Anesthesia Postprocedure Evaluation (Signed)
  Anesthesia Post-op Note  Patient: [name]  Procedure(s) Performed: Procedure(s) (LRB): EXPLORATION AND REVISION OF PENILE PROSTHESIS (N/A)  Patient Location: PACU  Anesthesia Type: General  Level of Consciousness: awake and alert   Airway and Oxygen Therapy: Patient Spontanous Breathing  Post-op Pain: mild  Post-op Assessment: Post-op Vital signs reviewed, Patient's Cardiovascular Status Stable, Respiratory Function Stable, Patent Airway and No signs of Nausea or vomiting  Last Vitals:  Filed Vitals:   11/23/14 0930  BP: 138/73  Pulse: 65  Temp:   Resp: 20    Post-op Vital Signs: stable   Complications: No apparent anesthesia complications

## 2014-11-26 ENCOUNTER — Encounter (HOSPITAL_COMMUNITY): Payer: Self-pay | Admitting: Urology

## 2014-11-26 DIAGNOSIS — H04122 Dry eye syndrome of left lacrimal gland: Secondary | ICD-10-CM | POA: Diagnosis not present

## 2014-11-26 DIAGNOSIS — H04121 Dry eye syndrome of right lacrimal gland: Secondary | ICD-10-CM | POA: Diagnosis not present

## 2014-11-27 ENCOUNTER — Encounter: Payer: Self-pay | Admitting: Neurology

## 2014-11-27 ENCOUNTER — Ambulatory Visit (INDEPENDENT_AMBULATORY_CARE_PROVIDER_SITE_OTHER): Payer: Medicare Other | Admitting: Neurology

## 2014-11-27 VITALS — BP 108/60 | HR 79 | Ht 74.0 in | Wt 188.0 lb

## 2014-11-27 DIAGNOSIS — M5481 Occipital neuralgia: Secondary | ICD-10-CM

## 2014-11-27 NOTE — Progress Notes (Signed)
Subjective:    [name] was seen in consultation in the movement disorder clinic at the request of Dr. Joya Salm.  His PCP is Eliezer Lofts, MD.  This patient is accompanied in the office by his child who supplements the history. The evaluation is for tremor.  I reviewed prior records that are available to me.  Looking back in his medical record, there are notes back to 2012 that indicate severe essential tremor.  Pt reports tremor has been worse since recent surgeries.  He had a shoulder surgery for bone spurs first and then had a shoulder replacement on the L and then had a cervical fusion surgery.  Prior to that, he had gastric bypass in 2013.    The patient is a 74 y.o. right handed male with a history of tremor.  Pt reports that tremor started after an episode of sepsis about 5 years ago.  Once he left the hospital, it was in the bilateral UE, overall slight but it has gotten worse over 8 months.  He cannot eat peas or the roll off the spoon.   There is no family hx of tremor.    Affected by caffeine:  No. Affected by alcohol: doesn't drink Affected by stress:  No. Affected by fatigue:  No. Spills soup if on spoon:  Yes.   Spills glass of liquid if full:  No. Affects ADL's (tying shoes, brushing teeth, etc):  No.  Pt had neck hematomas after surgery, which caused dysphagia.  A modified barium swallow was done on 08/14/2013 indicate severe cervical face dysphagia, moderate to severe pharyngeal phase dysphagia.  There is recommended if crush his medications or takes them in liquid form.  Outpatient speech language pathology and nutrition consult was also recommended.  11/01/13 update:  Pt is returning for f/u.  Started primidone last visit.   Pt states that it has been helpful but thinks that we could go up on the medication.  No SE.  Had carpal tunnel release on the L.  States that it didn't help.  Has also gone to speech therapy for swallowing, as recommended and that did not  help.  01/02/14 update:  The patient returns for follow-up today.  He has a history of essential tremor.  Last visit, his primidone was increased 50 mg twice a day.  "I think that if you increased it one more time, we will have it just right."  I got a note from his ENT physician at [facility].  They agreed that dysphagia was related to prior cervical spine surgery.  They told him that loss of balance is not due to an inner ear pathology and he was told to follow-up here.  He does have a history of diabetic peripheral neuropathy.  Pt states that balance has been off since the neck surgery and thinks that it is related.  He states that it actually has gotten slowly better.  Balance isn't great but he isn't falling anymore either which used to be a big problem.  05/02/14 update:  His primidone was increased last visit so that he is taking 100 mg in the morning and he remains on 50 mg at night.  The patient states that tremor has tremor is doing well.  He states that tremor is well controlled.  He only spills something or notices tremor 1-2 days a month, which is markedly improved.  No SE with the tremor.    11/02/14 update:  The patient has a history of essential tremor  and is on primidone, 100 mg in the morning and 50 mg at night. He thinks that tremor is well controlled.   I reviewed records since our last visit.  He has undergone a rather extensive workup for epigastric pain and weight loss.  He is a CT of the abdomen that was negative.  His H. pylori antibodies were negative.  He had a stress Myoview and while there was some evidence of ischemia, the patient did not believe that this was cardiac related and did not want to proceed with a cath.  He has seen gastroenterology and has had an EGD.  He was started on carafate and he thinks that it is helping.  He thinks that much of it is from the gastric bypass as he has emesis after the meals.    11/27/14 update:  Pt is f/u much earlier than expected.  On  primidone, 100 mg in AM, 50 mg at night.  The records that were made available to me were reviewed since last visit.  Had to have penile prosthesis removed that malfunctioned and insert a new one. Also, had surgery for his tear ducts yesterday. His wife states that Sunday AM he was getting out of the shower and he screamed and he felt like he was getting stabbed in the back of the head.  He's had this in the past but not this severe.  It is sore to the touch behind the ear.  It was awful for 15 min (felt like on fire) but if he turns his head to the right, he still feels it.    Current/Previously tried tremor medications: no  Current medications that may exacerbate tremor:  n/a  Outside reports reviewed: historical medical records, lab reports and referral letter/letters.  Allergies  Allergen Reactions  . Sulfacetamide Sodium     REACTION: Throat swelling  . Adhesive [Tape]     Tears skin-- "No tape of any kind, they all tear skin right off"    Current Outpatient Prescriptions on File Prior to Visit  Medication Sig Dispense Refill  . acetaminophen (TYLENOL) 500 MG tablet Take 1,000 mg by mouth every 6 (six) hours as needed for moderate pain or headache.    [name] atorvastatin (LIPITOR) 80 MG tablet Take 1 tablet by mouth at  bedtime 90 tablet 0  . azithromycin (ZITHROMAX) 250 MG tablet 2 tab po x 1 day then 1 tab po daily (Patient not taking: Reported on 11/14/2014) 6 tablet 0  . ciprofloxacin (CIPRO) 500 MG tablet Take 1 tablet (500 mg total) by mouth 2 (two) times daily. 14 tablet 0  . FLUoxetine (PROZAC) 40 MG capsule Take 1 capsule by mouth  daily 90 capsule 0  . furosemide (LASIX) 20 MG tablet Take 1 to 2 tablets by  mouth daily 180 tablet 1  . gabapentin (NEURONTIN) 300 MG capsule Take 300 mg by mouth at bedtime.     [name] lisinopril (PRINIVIL,ZESTRIL) 2.5 MG tablet Take 2.5 mg by mouth every morning.     . metFORMIN (GLUCOPHAGE) 500 MG tablet Take 500 mg by mouth 2 (two) times daily with a  meal.    . omeprazole (PRILOSEC) 40 MG capsule Take 1 capsule by mouth  daily 30 capsule 11  . Oxycodone HCl 10 MG TABS Take 1 tablet (10 mg total) by mouth every 4 (four) hours as needed. 30 tablet 0  . pantoprazole (PROTONIX) 40 MG tablet Take 1 tablet (40 mg total) by mouth 2 (two) times daily. (Patient  not taking: Reported on 11/14/2014) 60 tablet 3  . primidone (MYSOLINE) 50 MG tablet TAKE 2 TABLETS BY MOUTH IN THE MORNING, 1 TABLET IN THE EVENING (Patient taking differently: TAKE 1 TABLET BY MOUTH IN THE MORNING, 1 TABLET IN THE EVENING) 270 tablet 1  . silodosin (RAPAFLO) 8 MG CAPS capsule Take 1 capsule by mouth every morning.    . sucralfate (CARAFATE) 1 GM/10ML suspension Take 10 mLs (1 g total) by mouth 4 (four) times daily -  with meals and at bedtime. (Patient not taking: Reported on 11/14/2014) 420 mL 0  . warfarin (COUMADIN) 2.5 MG tablet Take 1 to 1.5 tablets by mouth daily or as directed by coumadin clinic (Patient taking differently: Take 2.5 mg by mouth daily at 6 PM. ) 105 tablet 1  . zolpidem (AMBIEN) 10 MG tablet TAKE 1/2 TO 1 TABLET BY MOUTH AT BEDTIME 30 tablet 0   No current facility-administered medications on file prior to visit.    Past Medical History  Diagnosis Date  . Diverticulosis of colon (without mention of hemorrhage)   . Restless legs syndrome (RLS)   . Anxiety state, unspecified   . Hx of gout     but doesn't take any meds  . Bruises easily     pt is on Coumadin  . Cancer ([place])     SKIN CANCER REMOVED  . Depressive disorder, not elsewhere classified     takes Prozac daily  . History of blood clots     behind right knee and then went into left lung 21yrs ago  . Peripheral edema     takes Furosemide daily  . Unspecified asthma(493.90)     as a child  . Pneumonia     last time about 80yrs ago  . Joint pain   . History of colon polyps   . History of kidney stones   . Hard of hearing     wears hearing aids  . Chronic atrial fibrillation (HCC)      a. on warfarin  . Peripheral vascular disease ([place])   . Pulmonary embolism ([place])     over 10 yrs ago "many years ago"  . ED (erectile dysfunction)   . Hx of diabetes mellitus     "no longer diabetic since gastric bypass" -  . Unspecified essential hypertension     hx of no longer on medication due to gastric bypass   . GERD (gastroesophageal reflux disease)   . Arthritis     hands   . Diabetes mellitus without complication ([place])     no longer diabetic due to gastric bypass  not on meds x 3 years per pt on 11/14/2014     Past Surgical History  Procedure Laterality Date  . Knee surgery Right     x 4  . Artery repair      Left forearm  . Foot surgery      Left foot   . Replacement total knee Left     x 2  . Breath tek h pylori  01/08/2011    Procedure: BREATH TEK H PYLORI;  Surgeon: Pedro Earls, MD;  Location: [facility];  Service: General;  Laterality: N/A;  . Hand surgery      LEFT  . Leg surgery      FOR NECROTIZING FASCITIS L LEG AND GROIN  . Gastric roux-en-y  08/11/2011    Procedure: LAPAROSCOPIC ROUX-EN-Y GASTRIC BYPASS WITH UPPER ENDOSCOPY;  Surgeon: Pedro Earls, MD;  Location:  [facility];  Service: General;  Laterality: N/A;  . Shoulder arthroscopy    . Shoulder surgery Left 2014  . Back surgery      x 3  . Knee surgery Left     arthroscopy  . Eye surgery      cataract bil  . Injections in back      x 18  . Colonoscopy    . Cystoscopy    . Total shoulder arthroplasty Left 01/26/2013    Procedure: TOTAL SHOULDER ARTHROPLASTY;  Surgeon: Nita Sells, MD;  Location: [place];  Service: Orthopedics;  Laterality: Left;  Left total shoulder arthroplasty  . Anterior cervical decomp/discectomy fusion N/A 05/09/2013    Procedure: ANTERIOR CERVICAL DECOMPRESSION/DISCECTOMY FUSION CERVICAL THREE-FOUR,CERVICAL FOUR-FIVE,CERVICAL FIVE-SIX;  Surgeon: Floyce Stakes, MD;  Location: [facility];  Service: Neurosurgery;  Laterality: N/A;  . Penile prosthesis  implant N/A 07/02/2014    Procedure: PENILE PROTHESIS INFLATABLE 3 PIECE (COLOPLAST) SCROTAL APPROACH;  Surgeon: Kathie Rhodes, MD;  Location: [facility];  Service: Urology;  Laterality: N/A;  . Esophagogastroduodenoscopy (egd) with propofol N/A 09/21/2014    Procedure: ESOPHAGOGASTRODUODENOSCOPY (EGD) WITH PROPOFOL;  Surgeon: Manya Silvas, MD;  Location: [facility];  Service: Endoscopy;  Laterality: N/A;  . Penile prosthesis implant N/A 11/23/2014    Procedure: EXPLORATION AND REVISION OF PENILE PROSTHESIS;  Surgeon: Kathie Rhodes, MD;  Location: [facility];  Service: Urology;  Laterality: N/A;    Social History   Social History  . Marital Status: Married    Spouse Name: N/A  . Number of Children: 1  . Years of Education: N/A   Occupational History  . Retired     Administrator   Social History Main Topics  . Smoking status: Never Smoker   . Smokeless tobacco: Never Used  . Alcohol Use: No  . Drug Use: No  . Sexual Activity: Yes   Other Topics Concern  . Not on file   Social History Narrative   DIET: 3 meals, F&V, some water, crystal light, No fast food   Exercise: walks treadmill 30 min      1 Caffeine drinks daily       No living will.           Family Status  Relation Status Death Age  . Mother Deceased     breast/uterine cancer  . Father Deceased     emphysema, heart disease  . Sister Deceased     alzheimer's disease  . Brother Deceased     struck by lightening  . Brother Deceased     colon cancer  . Brother Deceased     heart disease  . Brother Alive     prostate cancer  . Daughter Alive     healthy    Review of Systems   A complete 10 system ROS was obtained and was negative apart from what is mentioned.   Objective:   VITALS:   Filed Vitals:   11/27/14 1517  BP: 108/60  Pulse: 79  Height: 6\' 2"  (1.88 m)  Weight: 188 lb (85.276 kg)   Wt Readings from Last 3 Encounters:  11/27/14 188 lb (85.276 kg)  11/23/14 182 lb (82.555 kg)  11/14/14 182  lb (82.555 kg)     Gen:  Appears stated age and in NAD. HEENT:  Normocephalic, atraumatic. The mucous membranes are moist. The superficial temporal arteries are without ropiness or tenderness. CV:  RRR Lungs: CTAB Neck:  No bruits. There is significant tenderness  over the right occipital notch   NEUROLOGICAL:  Orientation:  The patient is alert and oriented x 3.  Recent and remote memory are intact.  Attention span and concentration are normal.  Able to name objects and repeat without trouble.  Fund of knowledge is appropriate Cranial nerves: There is good facial symmetry, but there is L ptosis (I looked at [id number] drivers license and it was the same). Extraocular muscles are intact and visual fields are full to confrontational testing. Speech is fluent and clear. Soft palate rises symmetrically and there is no tongue deviation. Hearing is intact to conversational tone. Tone: Tone is good throughout.  Mild gegenhalten. Sensation: Sensation is intact to light touch throughout. Coordination:  The patient has no dysdiadichokinesia or dysmetria. Motor: Strength is 5/5 in the bilateral upper and lower extremities.  Shoulder shrug is equal bilaterally.  There is no pronator drift.   Gait and Station: The patient ambulates well today  MOVEMENT EXAM: Tremor:  There is minimal tremor of the outstretched hands.  Minimal tremor can be seen/felt at rest  LABS    Chemistry      Component Value Date/Time   NA 140 11/14/2014 0940   K 4.8 11/14/2014 0940   CL 108 11/14/2014 0940   CO2 26 11/14/2014 0940   BUN 22* 11/14/2014 0940   CREATININE 1.64* 11/14/2014 0940      Component Value Date/Time   CALCIUM 8.8* 11/14/2014 0940   ALKPHOS 64 08/08/2014 1454   AST 25 08/08/2014 1454   ALT 24 08/08/2014 1454   BILITOT 0.4 08/08/2014 1454          Assessment/Plan:   1.  Essential Tremor.  -improved on primidone.  Continue with two in the AM (100 mg) and continue 50 mg at night.  Risks,  benefits, side effects and alternative therapies were discussed.  The opportunity to ask questions was given and they were answered to the best of my ability.  The patient expressed understanding and willingness to follow the outlined treatment protocols. 2.  Gait instability  -It is believed that ET alone can cause ataxia/balance changes due to changes in the cerebellar purkinje cell/climbing fiber synaptic transmission.   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  -looked better in this regard today 3.  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  Pt wanted lid lift but ophthalmology didn't want to proceed  5.  Right occipital neuralgia  -has significant tenderness over the right occipital notch.  Told him that we could take a wait and see approach or we could inject it with occipital nerve block.  Wants to schedule that after discussing r/b/se.  Will schedule him in the AM. 5.  No Follow-up on file.  Greater than 50% of 25 min visit in counseling re: safety.

## 2014-11-28 ENCOUNTER — Encounter: Payer: Self-pay | Admitting: Neurology

## 2014-11-28 ENCOUNTER — Ambulatory Visit (INDEPENDENT_AMBULATORY_CARE_PROVIDER_SITE_OTHER): Payer: Medicare Other | Admitting: Neurology

## 2014-11-28 VITALS — BP 100/68 | HR 78 | Ht 74.0 in | Wt 188.0 lb

## 2014-11-28 DIAGNOSIS — M5481 Occipital neuralgia: Secondary | ICD-10-CM

## 2014-11-28 MED ORDER — BUPIVACAINE HCL (PF) 0.5 % IJ SOLN
1.0000 mL | Freq: Once | INTRAMUSCULAR | Status: AC
  Administered 2014-11-28: 1 mL

## 2014-11-28 MED ORDER — METHYLPREDNISOLONE ACETATE 40 MG/ML IJ SUSP
40.0000 mg | Freq: Once | INTRAMUSCULAR | Status: AC
  Administered 2014-11-28: 40 mg via INTRAMUSCULAR

## 2014-11-28 NOTE — Procedures (Signed)
The patient present today for occipital nerve block on the right due to occipital neuralgia.  After discussing r/b/se and alternative tx's, the patient decided to proceed with the occipital nerve block.   Informed consent was obtained. The area was cleansed with alcohol prior to the procedure.  Approximately 40 mg of depomedrol and 1 cc of 0.5% marcaine were injected into the right occipital notch using a 27 gauge needle; prior to injection, the plunger was aspirated to make sure not in a blood vessel and no blood was retrieved.  The patient tolerated the procedure well and there were no complications.  He is to call me in 5 days if no better.  He will call me earlier if any questions or concerns.

## 2014-12-02 DIAGNOSIS — Z23 Encounter for immunization: Secondary | ICD-10-CM | POA: Diagnosis not present

## 2014-12-04 DIAGNOSIS — T83490A Other mechanical complication of penile (implanted) prosthesis, initial encounter: Secondary | ICD-10-CM | POA: Diagnosis not present

## 2014-12-06 ENCOUNTER — Other Ambulatory Visit: Payer: Self-pay | Admitting: Family Medicine

## 2014-12-06 ENCOUNTER — Other Ambulatory Visit: Payer: Self-pay | Admitting: Gastroenterology

## 2014-12-06 NOTE — Telephone Encounter (Signed)
Last office visit 10/01/2014.  Last refilled 11/07/2014 for #30 with no refills.  Ok to refill?

## 2014-12-07 NOTE — Telephone Encounter (Signed)
Zolpidem called into [facility].

## 2014-12-12 ENCOUNTER — Ambulatory Visit (INDEPENDENT_AMBULATORY_CARE_PROVIDER_SITE_OTHER): Payer: Medicare Other

## 2014-12-12 DIAGNOSIS — Z5181 Encounter for therapeutic drug level monitoring: Secondary | ICD-10-CM

## 2014-12-12 DIAGNOSIS — Z7901 Long term (current) use of anticoagulants: Secondary | ICD-10-CM | POA: Diagnosis not present

## 2014-12-12 DIAGNOSIS — I4891 Unspecified atrial fibrillation: Secondary | ICD-10-CM | POA: Diagnosis not present

## 2014-12-12 DIAGNOSIS — I824Y9 Acute embolism and thrombosis of unspecified deep veins of unspecified proximal lower extremity: Secondary | ICD-10-CM

## 2014-12-12 LAB — POCT INR: INR: 2

## 2014-12-17 DIAGNOSIS — T83490A Other mechanical complication of penile (implanted) prosthesis, initial encounter: Secondary | ICD-10-CM | POA: Diagnosis not present

## 2014-12-20 DIAGNOSIS — L820 Inflamed seborrheic keratosis: Secondary | ICD-10-CM | POA: Diagnosis not present

## 2014-12-20 DIAGNOSIS — L570 Actinic keratosis: Secondary | ICD-10-CM | POA: Diagnosis not present

## 2014-12-20 DIAGNOSIS — Z85828 Personal history of other malignant neoplasm of skin: Secondary | ICD-10-CM | POA: Diagnosis not present

## 2014-12-20 DIAGNOSIS — C44622 Squamous cell carcinoma of skin of right upper limb, including shoulder: Secondary | ICD-10-CM | POA: Diagnosis not present

## 2014-12-20 DIAGNOSIS — L821 Other seborrheic keratosis: Secondary | ICD-10-CM | POA: Diagnosis not present

## 2014-12-20 DIAGNOSIS — D485 Neoplasm of uncertain behavior of skin: Secondary | ICD-10-CM | POA: Diagnosis not present

## 2014-12-25 ENCOUNTER — Ambulatory Visit (INDEPENDENT_AMBULATORY_CARE_PROVIDER_SITE_OTHER): Payer: Medicare Other | Admitting: Urology

## 2014-12-25 ENCOUNTER — Encounter: Payer: Self-pay | Admitting: Urology

## 2014-12-25 VITALS — BP 93/58 | HR 86 | Ht 74.0 in | Wt 186.7 lb

## 2014-12-25 DIAGNOSIS — N529 Male erectile dysfunction, unspecified: Secondary | ICD-10-CM | POA: Diagnosis not present

## 2014-12-25 DIAGNOSIS — T83490A Other mechanical complication of penile (implanted) prosthesis, initial encounter: Secondary | ICD-10-CM | POA: Diagnosis not present

## 2014-12-25 NOTE — Progress Notes (Signed)
12/25/2014 1:29 PM   [name] Nov 08, 1940 [id number]  Referring provider: Jinny Sanders, MD [address] [place], [place] [address]  Chief Complaint  Patient presents with  . Problems with Penile prosthesis    New patient    HPI: 74 year old male with a history of refractory erectile dysfunction status post placement of Coloplast 3-piece inflatable penile prosthesis by Dr. Loel Lofty at [facility] on 07/02/2014. Since that time, he had difficulties with spontaneous autoinflation and ultimately underwent revision and replacement of the reservoir on 11/23/2014. He continues to have difficulty with his device and presents today for second opinion.  Per the patient, he initially elected to have a prosthesis placed after failing PDE5i, intracavernosal injections.  He reports that his wife is many years younger than himself and he elected to have the device placed primarily for the well-being of their sexual relationship.  Initially after the device was placed, and he did experience some excruciating pain while healing which ultimately resolved after approximately 8 weeks. He was then able to use the device satisfactorily without any problems for approximately 4 or 5 weeks. Unfortunately, he then began to experience difficulties with the device slowly inflating to approximately 33% of the fully erect state which occurred slowly over the course of 45 minutes after fully deflated the device. He ultimately was taken to the OR for exploration at which time records indicate that the reservoir did leak fluid under low pressure but not high pressure and the reservoir itself was replaced. It is unclear from the clinic records whether or not the scrotal pump was replaced although the patient denies this.   Shortly after replacement of the reservoir via a right inguinal incision, the same exact problem recurred.  [name] notes that when the device is partially inflated, he has had no  thickened discomfort from his penis rubbing as well as from the sensation of a partial erection. He describes this as "an itch that can be scratched".  Denies any redness, drainage, significant tenderness of the device, fevers, chills, or any issues voiding.  He is very disappointed and ideally would like to have the device in place in perfect working condition.   PMH: Past Medical History  Diagnosis Date  . Diverticulosis of colon (without mention of hemorrhage)   . Restless legs syndrome (RLS)   . Anxiety state, unspecified   . Hx of gout     but doesn't take any meds  . Bruises easily     pt is on Coumadin  . Cancer ([place])     SKIN CANCER REMOVED  . Depressive disorder, not elsewhere classified     takes Prozac daily  . History of blood clots     behind right knee and then went into left lung 50yrs ago  . Peripheral edema     takes Furosemide daily  . Unspecified asthma(493.90)     as a child  . Pneumonia     last time about 68yrs ago  . Joint pain   . History of colon polyps   . History of kidney stones   . Hard of hearing     wears hearing aids  . Chronic atrial fibrillation (HCC)     a. on warfarin  . Peripheral vascular disease ([place])   . Pulmonary embolism ([place])     over 10 yrs ago "many years ago"  . ED (erectile dysfunction)   . Hx of diabetes mellitus     "no longer diabetic  since gastric bypass" -  . Unspecified essential hypertension     hx of no longer on medication due to gastric bypass   . GERD (gastroesophageal reflux disease)   . Arthritis     hands   . Diabetes mellitus without complication ([place])     no longer diabetic due to gastric bypass  not on meds x 3 years per pt on 11/14/2014     Surgical History: Past Surgical History  Procedure Laterality Date  . Knee surgery Right     x 4  . Artery repair      Left forearm  . Foot surgery      Left foot   . Replacement total knee Left     x 2  . Breath tek h pylori  01/08/2011    Procedure:  BREATH TEK H PYLORI;  Surgeon: Pedro Earls, MD;  Location: [facility];  Service: General;  Laterality: N/A;  . Hand surgery      LEFT  . Leg surgery      FOR NECROTIZING FASCITIS L LEG AND GROIN  . 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 Shoulder arthroscopy    . Shoulder surgery Left 2014  . Back surgery      x 3  . Knee surgery Left     arthroscopy  . Eye surgery      cataract bil  . Injections in back      x 18  . Colonoscopy    . Cystoscopy    . Total shoulder arthroplasty Left 01/26/2013    Procedure: TOTAL SHOULDER ARTHROPLASTY;  Surgeon: Nita Sells, MD;  Location: [facility];  Service: Orthopedics;  Laterality: Left;  Left total shoulder arthroplasty  . Anterior cervical decomp/discectomy fusion N/A 05/09/2013    Procedure: ANTERIOR CERVICAL DECOMPRESSION/DISCECTOMY FUSION CERVICAL THREE-FOUR,CERVICAL FOUR-FIVE,CERVICAL FIVE-SIX;  Surgeon: Floyce Stakes, MD;  Location: [facility];  Service: Neurosurgery;  Laterality: N/A;  . Penile prosthesis implant N/A 07/02/2014    Procedure: PENILE PROTHESIS INFLATABLE 3 PIECE (COLOPLAST) SCROTAL APPROACH;  Surgeon: Kathie Rhodes, MD;  Location: [facility];  Service: Urology;  Laterality: N/A;  . Esophagogastroduodenoscopy (egd) with propofol N/A 09/21/2014    Procedure: ESOPHAGOGASTRODUODENOSCOPY (EGD) WITH PROPOFOL;  Surgeon: Manya Silvas, MD;  Location: [facility];  Service: Endoscopy;  Laterality: N/A;  . Penile prosthesis implant N/A 11/23/2014    Procedure: EXPLORATION AND REVISION OF PENILE PROSTHESIS;  Surgeon: Kathie Rhodes, MD;  Location: [facility];  Service: Urology;  Laterality: N/A;    Home Medications:    Medication List       This list is accurate as of: 12/25/14  1:29 PM.  Always use your most recent med list.               atorvastatin 80 MG tablet   Commonly known as:  LIPITOR  Take 1 tablet by mouth at  bedtime     FLUoxetine 40 MG capsule  Commonly known as:  PROZAC  Take 1 capsule by mouth  daily     furosemide 20 MG tablet  Commonly known as:  LASIX  Take 1 to 2 tablets by  mouth daily     gabapentin 300 MG capsule  Commonly known as:  NEURONTIN  Take 300 mg by mouth at bedtime.     lisinopril 2.5 MG tablet  Commonly known as:  PRINIVIL,ZESTRIL  Take 2.5 mg by mouth every morning.     metFORMIN 500 MG tablet  Commonly known as:  GLUCOPHAGE  Take 500 mg by mouth 2 (two) times daily with a meal.     primidone 50 MG tablet  Commonly known as:  MYSOLINE  TAKE 2 TABLETS BY MOUTH IN THE MORNING, 1 TABLET IN THE EVENING     RAPAFLO 8 MG Caps capsule  Generic drug:  silodosin  Take 8 mg by mouth daily with breakfast.     warfarin 2.5 MG tablet  Commonly known as:  COUMADIN  Take 1 to 1.5 tablets by mouth daily or as directed by coumadin clinic     zolpidem 10 MG tablet  Commonly known as:  AMBIEN  TAKE 1/2 TO 1 TABLET BY MOUTH AT BEDTIME        Allergies:  Allergies  Allergen Reactions  . Sulfacetamide Sodium     REACTION: Throat swelling  . Adhesive [Tape]     Tears skin-- "No tape of any kind, they all tear skin right off"    Family History: Family History  Problem Relation Age of Onset  . Uterine cancer Mother     mets  . Breast cancer Mother   . Cancer Mother     cervical  . Emphysema Father   . Alzheimer's disease Sister   . Prostate cancer Brother   . Heart attack Brother   . Colon cancer Brother 65    Social History:  reports that he has never smoked. He has never used smokeless tobacco. He reports that he does not drink alcohol or use illicit drugs.  ROS: UROLOGY Frequent Urination?: No Hard to postpone urination?: No Burning/pain with urination?: No Get up at night to urinate?: No Leakage of urine?: No Urine stream starts and stops?: No Trouble starting stream?: No Do you have  to strain to urinate?: No Blood in urine?: No Urinary tract infection?: No Sexually transmitted disease?: No Injury to kidneys or bladder?: No Painful intercourse?: No Weak stream?: No Erection problems?: Yes Penile pain?: Yes  Gastrointestinal Nausea?: No Vomiting?: No Indigestion/heartburn?: No Diarrhea?: No Constipation?: No  Constitutional Fever: No Night sweats?: No Weight loss?: No Fatigue?: No  Skin Skin rash/lesions?: Yes Itching?: Yes  Eyes Blurred vision?: No Double vision?: No  Ears/Nose/Throat Sore throat?: No Sinus problems?: No  Hematologic/Lymphatic Swollen glands?: No Easy bruising?: Yes  Cardiovascular Leg swelling?: No Chest pain?: No  Respiratory Cough?: No Shortness of breath?: No  Endocrine Excessive thirst?: No  Musculoskeletal Back pain?: Yes Joint pain?: Yes  Neurological Headaches?: No Dizziness?: No  Psychologic Depression?: No Anxiety?: No  Physical Exam: BP 93/58 mmHg  Pulse 86  Ht 6\' 2"  (1.88 m)  Wt 186 lb 11.2 oz (84.687 kg)  BMI 23.96 kg/m2  Constitutional:  Alert and oriented, No acute distress. HEENT: [place] AT, moist mucus membranes.  Trachea midline, no masses. Cardiovascular: No clubbing, cyanosis, or edema. Respiratory: Normal respiratory effort, no increased work of breathing. GI: Abdomen is soft, nontender, nondistended, no abdominal masses.  Loose skin/ pannus.  Well healed right inguinal incision noted. GU: No CVA tenderness.  Circumcised phallus with bilateral penile shaft cylinders present, very slight fullness of the device but pliable (bends easily to 90 degrees).  Pump slightly high riding and scrotum but nontender. Bilateral descended testicles without masses. Tubing palpable towards right inguinal canal. Reservoir nonpalpable. Pump cycled today which seems to be working well. Able to fully deflate  the device by milking fluid from penile shaft after pressing release valve.  No spontaneous autoinflation  noted after just a few minutes. Skin: No rashes, bruises or suspicious lesions. Lymph: No cervical or inguinal adenopathy. Neurologic: Grossly intact, no focal deficits, moving all 4 extremities. Psychiatric: Normal mood and affect.  Laboratory Data: Lab Results  Component Value Date   WBC 7.3 11/14/2014   HGB 12.8* 11/14/2014   HCT 39.7 11/14/2014   MCV 98.5 11/14/2014   PLT 204 11/14/2014    Lab Results  Component Value Date   CREATININE 1.64* 11/14/2014    Lab Results  Component Value Date   PSA 1.02 07/03/2008    Lab Results  Component Value Date   HGBA1C 8.3* 03/07/2012    Pertinent Imaging: n/a  Assessment & Plan:   74 year old male with erectile dysfunction status post placement of 3-piece inflatable penile prosthesis and subsequent revision for auto inflation. On exam today, in the flaccid state prior to manipulation, there is some mild fullness of the device in the penile  cylinders but it certainly is not inflated to the degree where I would anticipate any irritation/ bother.   It is unclear whether there is a true malfunction of the lockout valve (device appears to be cycling well on exam) or  there is generalized dissatisfaction with the device.   If he continues to be bothered significantly, I have recommended either replacement of the entire device or perhaps consideration of changing from Coloplast to the AMS device.   We discussed that with each manipulation, there is risk for development of infection or other complications. Given the complexity of this case, I did offer him a referral to sexual medicine specialist 2 places and revises these devices on a regular basis to optimize his potential outcome.    1. Erectile dysfunction unresponsive to phosphodiesterase type 5 inhibitor S/p IPP  2. Malfunction of penile prosthesis, initial encounter [facility]) Referral to sexual medicine specialist Dr. Francesca Jewett at [facility] offered for expert opinion.  Mr. Kong will let  us know if he would like to pursue this.     Return if symptoms worsen or fail to improve.  Hollice Espy, MD  [facility] [address] [place], [place] [address] [phone]

## 2014-12-26 ENCOUNTER — Ambulatory Visit (INDEPENDENT_AMBULATORY_CARE_PROVIDER_SITE_OTHER): Payer: Medicare Other

## 2014-12-26 DIAGNOSIS — Z7901 Long term (current) use of anticoagulants: Secondary | ICD-10-CM | POA: Diagnosis not present

## 2014-12-26 DIAGNOSIS — Z5181 Encounter for therapeutic drug level monitoring: Secondary | ICD-10-CM

## 2014-12-26 DIAGNOSIS — I4891 Unspecified atrial fibrillation: Secondary | ICD-10-CM | POA: Diagnosis not present

## 2014-12-26 DIAGNOSIS — I824Y9 Acute embolism and thrombosis of unspecified deep veins of unspecified proximal lower extremity: Secondary | ICD-10-CM

## 2014-12-26 LAB — POCT INR: INR: 2.5

## 2015-01-02 ENCOUNTER — Telehealth: Payer: Self-pay | Admitting: Urology

## 2015-01-02 DIAGNOSIS — N529 Male erectile dysfunction, unspecified: Secondary | ICD-10-CM

## 2015-01-02 NOTE — Telephone Encounter (Signed)
Pt called stating that Dr. Erlene Quan wanted him to call her once he was seen by a doctor in [facility] on 11/17.  Please call patient at [phone] to discuss.

## 2015-01-02 NOTE — Telephone Encounter (Signed)
Discussed with patient.  He would like to precede with referral to [facility] for another opinion.  Hollice Espy, MD

## 2015-01-05 ENCOUNTER — Other Ambulatory Visit: Payer: Self-pay | Admitting: Neurology

## 2015-01-07 NOTE — Telephone Encounter (Signed)
Primidone refill requested. Per last office note- patient to remain on medication. Refill approved and sent to patient's pharmacy.   

## 2015-01-07 NOTE — Telephone Encounter (Signed)
Faxed the referral over today they will contact the patient with an appointment. i tried to call the patient to let him know this got his vm will try again  [place]

## 2015-01-11 DIAGNOSIS — R2231 Localized swelling, mass and lump, right upper limb: Secondary | ICD-10-CM | POA: Diagnosis not present

## 2015-01-13 ENCOUNTER — Other Ambulatory Visit: Payer: Self-pay | Admitting: Family Medicine

## 2015-01-14 NOTE — Telephone Encounter (Signed)
Zolpidem called into [facility]. 

## 2015-01-14 NOTE — Telephone Encounter (Signed)
Last office visit 10/01/2014.  Last refilled 12/07/2014 for #30 with no refills.  Ok to refill?

## 2015-01-23 ENCOUNTER — Ambulatory Visit (INDEPENDENT_AMBULATORY_CARE_PROVIDER_SITE_OTHER): Payer: Medicare Other

## 2015-01-23 DIAGNOSIS — Z7901 Long term (current) use of anticoagulants: Secondary | ICD-10-CM | POA: Diagnosis not present

## 2015-01-23 DIAGNOSIS — I824Y9 Acute embolism and thrombosis of unspecified deep veins of unspecified proximal lower extremity: Secondary | ICD-10-CM | POA: Diagnosis not present

## 2015-01-23 DIAGNOSIS — I4891 Unspecified atrial fibrillation: Secondary | ICD-10-CM

## 2015-01-23 DIAGNOSIS — Z5181 Encounter for therapeutic drug level monitoring: Secondary | ICD-10-CM

## 2015-01-23 LAB — POCT INR: INR: 2.6

## 2015-01-25 DIAGNOSIS — H04122 Dry eye syndrome of left lacrimal gland: Secondary | ICD-10-CM | POA: Diagnosis not present

## 2015-01-25 DIAGNOSIS — H04121 Dry eye syndrome of right lacrimal gland: Secondary | ICD-10-CM | POA: Diagnosis not present

## 2015-01-31 ENCOUNTER — Other Ambulatory Visit: Payer: Self-pay | Admitting: Family Medicine

## 2015-01-31 NOTE — Telephone Encounter (Signed)
Zolpidem called into [facility]. 

## 2015-01-31 NOTE — Telephone Encounter (Signed)
Last office visit 10/01/2014.  Last refilled 01/14/2015 for #30 with no refills.  Ok to refill?

## 2015-02-07 ENCOUNTER — Encounter: Payer: Self-pay | Admitting: *Deleted

## 2015-02-18 ENCOUNTER — Other Ambulatory Visit: Payer: Self-pay | Admitting: Family Medicine

## 2015-02-20 ENCOUNTER — Ambulatory Visit (INDEPENDENT_AMBULATORY_CARE_PROVIDER_SITE_OTHER): Payer: Medicare Other

## 2015-02-20 DIAGNOSIS — Z7901 Long term (current) use of anticoagulants: Secondary | ICD-10-CM | POA: Diagnosis not present

## 2015-02-20 DIAGNOSIS — I824Y9 Acute embolism and thrombosis of unspecified deep veins of unspecified proximal lower extremity: Secondary | ICD-10-CM

## 2015-02-20 DIAGNOSIS — Z5181 Encounter for therapeutic drug level monitoring: Secondary | ICD-10-CM

## 2015-02-20 DIAGNOSIS — I4891 Unspecified atrial fibrillation: Secondary | ICD-10-CM

## 2015-02-20 LAB — POCT INR: INR: 2.8

## 2015-02-21 DIAGNOSIS — Z86711 Personal history of pulmonary embolism: Secondary | ICD-10-CM | POA: Diagnosis not present

## 2015-02-21 DIAGNOSIS — T83490A Other mechanical complication of penile (implanted) prosthesis, initial encounter: Secondary | ICD-10-CM | POA: Diagnosis not present

## 2015-02-21 DIAGNOSIS — Z79899 Other long term (current) drug therapy: Secondary | ICD-10-CM | POA: Diagnosis not present

## 2015-02-21 DIAGNOSIS — M542 Cervicalgia: Secondary | ICD-10-CM | POA: Diagnosis not present

## 2015-02-21 DIAGNOSIS — Z960 Presence of urogenital implants: Secondary | ICD-10-CM | POA: Diagnosis not present

## 2015-02-21 DIAGNOSIS — K219 Gastro-esophageal reflux disease without esophagitis: Secondary | ICD-10-CM | POA: Diagnosis not present

## 2015-02-21 DIAGNOSIS — Z01818 Encounter for other preprocedural examination: Secondary | ICD-10-CM | POA: Diagnosis not present

## 2015-02-21 DIAGNOSIS — I739 Peripheral vascular disease, unspecified: Secondary | ICD-10-CM | POA: Diagnosis not present

## 2015-02-21 DIAGNOSIS — N390 Urinary tract infection, site not specified: Secondary | ICD-10-CM | POA: Diagnosis not present

## 2015-02-21 DIAGNOSIS — I482 Chronic atrial fibrillation: Secondary | ICD-10-CM | POA: Diagnosis not present

## 2015-02-21 DIAGNOSIS — Z882 Allergy status to sulfonamides status: Secondary | ICD-10-CM | POA: Diagnosis not present

## 2015-02-21 DIAGNOSIS — M1990 Unspecified osteoarthritis, unspecified site: Secondary | ICD-10-CM | POA: Diagnosis not present

## 2015-02-21 DIAGNOSIS — Z96612 Presence of left artificial shoulder joint: Secondary | ICD-10-CM | POA: Diagnosis not present

## 2015-02-21 DIAGNOSIS — N529 Male erectile dysfunction, unspecified: Secondary | ICD-10-CM | POA: Diagnosis not present

## 2015-02-21 DIAGNOSIS — I10 Essential (primary) hypertension: Secondary | ICD-10-CM | POA: Diagnosis not present

## 2015-02-21 DIAGNOSIS — I4891 Unspecified atrial fibrillation: Secondary | ICD-10-CM | POA: Diagnosis not present

## 2015-02-21 DIAGNOSIS — J45909 Unspecified asthma, uncomplicated: Secondary | ICD-10-CM | POA: Diagnosis not present

## 2015-02-21 DIAGNOSIS — E119 Type 2 diabetes mellitus without complications: Secondary | ICD-10-CM | POA: Diagnosis not present

## 2015-02-21 DIAGNOSIS — E118 Type 2 diabetes mellitus with unspecified complications: Secondary | ICD-10-CM | POA: Diagnosis not present

## 2015-02-21 DIAGNOSIS — Z7901 Long term (current) use of anticoagulants: Secondary | ICD-10-CM | POA: Diagnosis not present

## 2015-02-22 DIAGNOSIS — H04123 Dry eye syndrome of bilateral lacrimal glands: Secondary | ICD-10-CM | POA: Diagnosis not present

## 2015-02-27 DIAGNOSIS — M1711 Unilateral primary osteoarthritis, right knee: Secondary | ICD-10-CM | POA: Diagnosis not present

## 2015-02-28 ENCOUNTER — Inpatient Hospital Stay
Admission: EM | Admit: 2015-02-28 | Discharge: 2015-03-03 | DRG: 390 | Disposition: A | Discharge status: Home / Self Care | Payer: Medicare Other | Attending: General Surgery | Admitting: General Surgery

## 2015-02-28 ENCOUNTER — Emergency Department: Payer: Medicare Other

## 2015-02-28 ENCOUNTER — Encounter: Payer: Self-pay | Admitting: Emergency Medicine

## 2015-02-28 DIAGNOSIS — Z8249 Family history of ischemic heart disease and other diseases of the circulatory system: Secondary | ICD-10-CM

## 2015-02-28 DIAGNOSIS — I482 Chronic atrial fibrillation: Secondary | ICD-10-CM | POA: Diagnosis present

## 2015-02-28 DIAGNOSIS — K56609 Unspecified intestinal obstruction, unspecified as to partial versus complete obstruction: Secondary | ICD-10-CM

## 2015-02-28 DIAGNOSIS — H9190 Unspecified hearing loss, unspecified ear: Secondary | ICD-10-CM | POA: Diagnosis present

## 2015-02-28 DIAGNOSIS — E1165 Type 2 diabetes mellitus with hyperglycemia: Secondary | ICD-10-CM | POA: Diagnosis present

## 2015-02-28 DIAGNOSIS — G2581 Restless legs syndrome: Secondary | ICD-10-CM | POA: Diagnosis present

## 2015-02-28 DIAGNOSIS — Z7901 Long term (current) use of anticoagulants: Secondary | ICD-10-CM | POA: Diagnosis not present

## 2015-02-28 DIAGNOSIS — K565 Intestinal adhesions [bands] with obstruction (postprocedural) (postinfection): Principal | ICD-10-CM | POA: Diagnosis present

## 2015-02-28 DIAGNOSIS — Z87442 Personal history of urinary calculi: Secondary | ICD-10-CM | POA: Diagnosis not present

## 2015-02-28 DIAGNOSIS — Z85828 Personal history of other malignant neoplasm of skin: Secondary | ICD-10-CM

## 2015-02-28 DIAGNOSIS — Z9884 Bariatric surgery status: Secondary | ICD-10-CM

## 2015-02-28 DIAGNOSIS — K219 Gastro-esophageal reflux disease without esophagitis: Secondary | ICD-10-CM | POA: Diagnosis present

## 2015-02-28 DIAGNOSIS — Z803 Family history of malignant neoplasm of breast: Secondary | ICD-10-CM

## 2015-02-28 DIAGNOSIS — I739 Peripheral vascular disease, unspecified: Secondary | ICD-10-CM | POA: Diagnosis present

## 2015-02-28 DIAGNOSIS — J45909 Unspecified asthma, uncomplicated: Secondary | ICD-10-CM | POA: Diagnosis present

## 2015-02-28 DIAGNOSIS — F411 Generalized anxiety disorder: Secondary | ICD-10-CM | POA: Diagnosis present

## 2015-02-28 DIAGNOSIS — Z800 Family history of malignant neoplasm of digestive organs: Secondary | ICD-10-CM | POA: Diagnosis not present

## 2015-02-28 DIAGNOSIS — F329 Major depressive disorder, single episode, unspecified: Secondary | ICD-10-CM | POA: Diagnosis present

## 2015-02-28 DIAGNOSIS — Z86711 Personal history of pulmonary embolism: Secondary | ICD-10-CM | POA: Diagnosis not present

## 2015-02-28 DIAGNOSIS — E1151 Type 2 diabetes mellitus with diabetic peripheral angiopathy without gangrene: Secondary | ICD-10-CM | POA: Diagnosis present

## 2015-02-28 DIAGNOSIS — Z8042 Family history of malignant neoplasm of prostate: Secondary | ICD-10-CM

## 2015-02-28 DIAGNOSIS — Z820 Family history of epilepsy and other diseases of the nervous system: Secondary | ICD-10-CM

## 2015-02-28 DIAGNOSIS — R109 Unspecified abdominal pain: Secondary | ICD-10-CM | POA: Diagnosis not present

## 2015-02-28 DIAGNOSIS — K5730 Diverticulosis of large intestine without perforation or abscess without bleeding: Secondary | ICD-10-CM | POA: Diagnosis present

## 2015-02-28 DIAGNOSIS — Z86010 Personal history of colonic polyps: Secondary | ICD-10-CM | POA: Diagnosis not present

## 2015-02-28 DIAGNOSIS — Z8049 Family history of malignant neoplasm of other genital organs: Secondary | ICD-10-CM | POA: Diagnosis not present

## 2015-02-28 DIAGNOSIS — Z825 Family history of asthma and other chronic lower respiratory diseases: Secondary | ICD-10-CM | POA: Diagnosis not present

## 2015-02-28 DIAGNOSIS — E11649 Type 2 diabetes mellitus with hypoglycemia without coma: Secondary | ICD-10-CM | POA: Diagnosis present

## 2015-02-28 DIAGNOSIS — Z7984 Long term (current) use of oral hypoglycemic drugs: Secondary | ICD-10-CM | POA: Diagnosis not present

## 2015-02-28 DIAGNOSIS — K5660 Unspecified intestinal obstruction: Secondary | ICD-10-CM | POA: Diagnosis not present

## 2015-02-28 DIAGNOSIS — K5790 Diverticulosis of intestine, part unspecified, without perforation or abscess without bleeding: Secondary | ICD-10-CM | POA: Diagnosis not present

## 2015-02-28 LAB — CBC
HCT: 36.1 % — ABNORMAL LOW (ref 40.0–52.0)
Hemoglobin: 12 g/dL — ABNORMAL LOW (ref 13.0–18.0)
MCH: 32.3 pg (ref 26.0–34.0)
MCHC: 33.4 g/dL (ref 32.0–36.0)
MCV: 96.8 fL (ref 80.0–100.0)
PLATELETS: 213 10*3/uL (ref 150–440)
RBC: 3.73 MIL/uL — AB (ref 4.40–5.90)
RDW: 14.8 % — ABNORMAL HIGH (ref 11.5–14.5)
WBC: 10.2 10*3/uL (ref 3.8–10.6)

## 2015-02-28 LAB — COMPREHENSIVE METABOLIC PANEL
ALT: 51 U/L (ref 17–63)
AST: 31 U/L (ref 15–41)
Albumin: 4.1 g/dL (ref 3.5–5.0)
Alkaline Phosphatase: 61 U/L (ref 38–126)
Anion gap: 8 (ref 5–15)
BUN: 18 mg/dL (ref 6–20)
CALCIUM: 9 mg/dL (ref 8.9–10.3)
CHLORIDE: 111 mmol/L (ref 101–111)
CO2: 20 mmol/L — ABNORMAL LOW (ref 22–32)
CREATININE: 1.73 mg/dL — AB (ref 0.61–1.24)
GFR, EST AFRICAN AMERICAN: 43 mL/min — AB (ref >60)
GFR, EST NON AFRICAN AMERICAN: 37 mL/min — AB (ref >60)
Glucose, Bld: 132 mg/dL — ABNORMAL HIGH (ref 65–99)
Potassium: 3.9 mmol/L (ref 3.5–5.1)
Sodium: 139 mmol/L (ref 135–145)
TOTAL PROTEIN: 6.7 g/dL (ref 6.5–8.1)
Total Bilirubin: 0.3 mg/dL (ref 0.3–1.2)

## 2015-02-28 LAB — URINALYSIS COMPLETE WITH MICROSCOPIC (ARMC ONLY)
BILIRUBIN URINE: NEGATIVE
Bacteria, UA: NONE SEEN
GLUCOSE, UA: NEGATIVE mg/dL
Hgb urine dipstick: NEGATIVE
KETONES UR: NEGATIVE mg/dL
NITRITE: NEGATIVE
Protein, ur: 30 mg/dL — AB
SPECIFIC GRAVITY, URINE: 1.02 (ref 1.005–1.030)
Squamous Epithelial / LPF: NONE SEEN
pH: 5 (ref 5.0–8.0)

## 2015-02-28 LAB — PROTIME-INR
INR: 3.42
Prothrombin Time: 33.8 seconds — ABNORMAL HIGH (ref 11.4–15.0)

## 2015-02-28 LAB — LIPASE, BLOOD: Lipase: 23 U/L (ref 11–51)

## 2015-02-28 MED ORDER — ONDANSETRON HCL 4 MG/2ML IJ SOLN
4.0000 mg | Freq: Once | INTRAMUSCULAR | Status: AC
  Administered 2015-02-28: 4 mg via INTRAVENOUS

## 2015-02-28 MED ORDER — ONDANSETRON HCL 4 MG/2ML IJ SOLN
INTRAMUSCULAR | Status: AC
Start: 2015-02-28 — End: 2015-02-28
  Administered 2015-02-28: 4 mg via INTRAVENOUS
  Filled 2015-02-28: qty 2

## 2015-02-28 MED ORDER — ONDANSETRON HCL 4 MG/2ML IJ SOLN: 4.0000 mg | Freq: Four times a day (QID) | INTRAMUSCULAR | Status: DC | PRN

## 2015-02-28 MED ORDER — MORPHINE SULFATE (PF) 2 MG/ML IV SOLN
2.0000 mg | Freq: Once | INTRAVENOUS | Status: AC
  Administered 2015-02-28: 2 mg via INTRAVENOUS

## 2015-02-28 MED ORDER — ONDANSETRON 4 MG PO TBDP: 4.0000 mg | ORAL_TABLET | Freq: Four times a day (QID) | ORAL | Status: DC | PRN

## 2015-02-28 MED ORDER — SODIUM CHLORIDE 0.9 % IV SOLN
1.0000 g | INTRAVENOUS | Status: DC
  Administered 2015-03-01: 1 g via INTRAVENOUS
  Filled 2015-02-28 (×2): qty 1

## 2015-02-28 MED ORDER — INSULIN ASPART 100 UNIT/ML ~~LOC~~ SOLN
0.0000 [IU] | SUBCUTANEOUS | Status: DC
  Filled 2015-02-28: qty 2

## 2015-02-28 MED ORDER — IOHEXOL 240 MG/ML SOLN
25.0000 mL | Freq: Once | INTRAMUSCULAR | Status: AC | PRN
  Administered 2015-02-28: 25 mL via ORAL
  Filled 2015-02-28: qty 25

## 2015-02-28 MED ORDER — PANTOPRAZOLE SODIUM 40 MG IV SOLR
40.0000 mg | Freq: Every day | INTRAVENOUS | Status: DC
  Administered 2015-03-01 – 2015-03-02 (×3): 40 mg via INTRAVENOUS
  Filled 2015-02-28 (×3): qty 40

## 2015-02-28 MED ORDER — HYDRALAZINE HCL 20 MG/ML IJ SOLN: 10.0000 mg | INTRAMUSCULAR | Status: DC | PRN

## 2015-02-28 MED ORDER — KCL IN DEXTROSE-NACL 20-5-0.45 MEQ/L-%-% IV SOLN
INTRAVENOUS | Status: DC
  Administered 2015-03-01 – 2015-03-02 (×5): via INTRAVENOUS
  Filled 2015-02-28 (×7): qty 1000

## 2015-02-28 MED ORDER — VITAMIN K1 10 MG/ML IJ SOLN
10.0000 mg | Freq: Once | INTRAMUSCULAR | Status: AC
  Administered 2015-03-01: 10 mg via INTRAVENOUS
  Filled 2015-02-28: qty 1

## 2015-02-28 MED ORDER — HYDROMORPHONE HCL 1 MG/ML IJ SOLN
1.0000 mg | INTRAMUSCULAR | Status: DC | PRN
  Administered 2015-02-28 – 2015-03-02 (×9): 1 mg via INTRAVENOUS
  Filled 2015-02-28 (×9): qty 1

## 2015-02-28 MED ORDER — DIPHENHYDRAMINE HCL 50 MG/ML IJ SOLN
12.5000 mg | Freq: Four times a day (QID) | INTRAMUSCULAR | Status: DC | PRN
  Administered 2015-03-01: 12.5 mg via INTRAVENOUS
  Filled 2015-02-28: qty 1

## 2015-02-28 MED ORDER — SODIUM CHLORIDE 0.9 % IV BOLUS (SEPSIS)
500.0000 mL | Freq: Once | INTRAVENOUS | Status: AC
  Administered 2015-02-28: 500 mL via INTRAVENOUS

## 2015-02-28 MED ORDER — MORPHINE SULFATE (PF) 2 MG/ML IV SOLN
INTRAVENOUS | Status: AC
  Administered 2015-02-28: 2 mg via INTRAVENOUS
  Filled 2015-02-28: qty 1

## 2015-02-28 MED ORDER — DIPHENHYDRAMINE HCL 12.5 MG/5ML PO ELIX
12.5000 mg | ORAL_SOLUTION | Freq: Four times a day (QID) | ORAL | Status: DC | PRN
Start: 2015-02-28 — End: 2015-03-03

## 2015-02-28 MED ORDER — IOHEXOL 300 MG/ML  SOLN
75.0000 mL | Freq: Once | INTRAMUSCULAR | Status: AC | PRN
  Administered 2015-02-28: 75 mL via INTRAVENOUS
  Filled 2015-02-28: qty 75

## 2015-02-28 NOTE — ED Notes (Signed)
Care transferred to [facility].

## 2015-02-28 NOTE — ED Provider Notes (Signed)
[facility] Emergency Department Provider Note    ____________________________________________  Time seen: 2015  I have reviewed the triage vital signs and the nursing notes.   HISTORY  Chief Complaint Abdominal Pain   History limited by: Not Limited   HPI [name] is a 75 y.o. male who presents to the emergency department today because of concerns for abdominal pain. He states it started roughly 36 hours ago. He states it started all of a sudden. He was driving at the time. He described it as a sensation of a knife being stabbed into him. It was located in the central and right abdomen. It did radiate to the back. He did have some associated diarrhea and emesis. He states that the diarrhea and emesis has since stopped but his pain has continued. He now describes it slightly more mild with occasional severe spikes. He denies any fevers. He denies any dysuria.     Past Medical History  Diagnosis Date  . Diverticulosis of colon (without mention of hemorrhage)   . Restless legs syndrome (RLS)   . Anxiety state, unspecified   . Hx of gout     but doesn't take any meds  . Bruises easily     pt is on Coumadin  . Cancer ([place])     SKIN CANCER REMOVED  . Depressive disorder, not elsewhere classified     takes Prozac daily  . History of blood clots     behind right knee and then went into left lung 79yrs ago  . Peripheral edema     takes Furosemide daily  . Unspecified asthma(493.90)     as a child  . Pneumonia     last time about 34yrs ago  . Joint pain   . History of colon polyps   . History of kidney stones   . Hard of hearing     wears hearing aids  . Chronic atrial fibrillation (HCC)     a. on warfarin  . Peripheral vascular disease ([place])   . Pulmonary embolism ([place])     over 10 yrs ago "many years ago"  . ED (erectile dysfunction)   . Hx of diabetes mellitus     "no longer diabetic since gastric bypass" -  . Unspecified essential  hypertension     hx of no longer on medication due to gastric bypass   . GERD (gastroesophageal reflux disease)   . Arthritis     hands   . Diabetes mellitus without complication ([place])     no longer diabetic due to gastric bypass  not on meds x 3 years per pt on 11/14/2014     Patient Active Problem List   Diagnosis Date Noted  . Sinus problem 10/01/2014  . Periumbilical abdominal pain 08/08/2014  . Nausea with vomiting 08/08/2014  . Weight loss 08/08/2014  . Abdominal pain, epigastric 08/08/2014  . Abdominal pain, periumbilical [id number]  . GERD (gastroesophageal reflux disease) 08/03/2014  . Acid reflux 08/03/2014  . Erectile dysfunction 07/02/2014  . ED (erectile dysfunction) of organic origin 07/02/2014  . Type 2 diabetes mellitus ([place]) 06/06/2014  . Counseling regarding end of life decision making 01/23/2014  . Other specified counseling 01/23/2014  . Essential and other specified forms of tremor 08/30/2013  . Dysphagia, pharyngoesophageal phase 08/30/2013  . Benign essential tremor 08/30/2013  . COPD, mild ([place]) 07/13/2013  . Chronic obstructive pulmonary disease ([place]) 07/13/2013  . Post-op pain 05/13/2013  . Postoperative pain, acute, shoulder 05/13/2013  .  Arthralgia of shoulder 05/13/2013  . Pain following surgery or procedure 05/13/2013  . Cervical stenosis of spinal canal 05/09/2013  . Cervical spinal stenosis 05/09/2013  . Encounter for therapeutic drug monitoring 04/26/2013  . Panniculitis 03/09/2013  . Shoulder arthritis 01/26/2013  . Disorder of shoulder 01/26/2013  . Peripheral neuropathy ([place]) 12/11/2011  . Hereditary and idiopathic neuropathy 12/11/2011  . 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 History of surgical procedure 09/03/2011  . Atrial fibrillation, permanent ([place]) 08/12/2011  . Atrial fibrillation ([place]) 08/12/2011  . Bacterial overgrowth syndrome 10/28/2010  . Digestive disorder 10/28/2010  . Nonsurgical dumping syndrome 10/14/2010   . 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 Diverticulitis of colon 09/02/2010  . Acute venous embolism and thrombosis of deep vessels of proximal lower extremity ([place]) 08/01/2010  . Vascular disorder of lower extremity 08/01/2010  . OSTEOPOROSIS, DRUG-INDUCED 11/26/2009  . Regional osteoporosis 11/26/2009  . Anemia 09/02/2009  . RENAL INSUFFICIENCY 09/02/2009  . Disorder resulting from impaired renal function 09/02/2009  . ALLERGIC RHINITIS CAUSE UNSPECIFIED 01/17/2009  . Allergic rhinitis 01/17/2009  . ASTHMA, PERSISTENT, MILD 11/13/2008  . Airway hyperreactivity 11/13/2008  . FOOT SURGERY, HX OF 12/18/2006  . KNEE REPLACEMENT, LEFT, HX OF 12/18/2006  . Other specified personal risk factors, not elsewhere classified 12/18/2006  . History of knee surgery 12/18/2006  . HERPES ZOSTER W/NERVOUS COMPLICATION NEC [id number]  . Post viral syndrome 07/01/2006  . HYPERCHOLESTEROLEMIA 05/19/2006  . ANXIETY 05/19/2006  . DEPRESSION 05/19/2006  . RESTLESS LEG SYNDROME 05/19/2006  . HYPERTENSION 05/19/2006  . DIVERTICULOSIS, COLON 05/19/2006  . IRRITABLE BOWEL SYNDROME 05/19/2006  . LOW BACK PAIN, CHRONIC 05/19/2006  . Anxiety state 05/19/2006  . Diverticular disease of large intestine 05/19/2006  . Essential (primary) hypertension 05/19/2006  . Adaptive colitis 05/19/2006  . LBP (low back pain) 05/19/2006  . Major depressive disorder with single episode ([place]) 05/19/2006  . Restless leg 05/19/2006    Past Surgical History  Procedure Laterality Date  . Knee surgery Right     x 4  . Artery repair      Left forearm  . Foot surgery      Left foot   . Replacement total knee Left     x 2  . Breath tek h pylori  01/08/2011    Procedure: BREATH TEK H PYLORI;  Surgeon: Pedro Earls, MD;  Location: [facility];  Service: General;  Laterality: N/A;  . Hand surgery      LEFT  . Leg surgery      FOR NECROTIZING FASCITIS L LEG AND GROIN  . Gastric roux-en-y   08/11/2011    Procedure: LAPAROSCOPIC ROUX-EN-Y GASTRIC BYPASS WITH UPPER ENDOSCOPY;  Surgeon: Pedro Earls, MD;  Location: [facility];  Service: General;  Laterality: N/A;  . Shoulder arthroscopy    . Shoulder surgery Left 2014  . Back surgery      x 3  . Knee surgery Left     arthroscopy  . Eye surgery      cataract bil  . Injections in back      x 18  . Colonoscopy    . Cystoscopy    . Total shoulder arthroplasty Left 01/26/2013    Procedure: TOTAL SHOULDER ARTHROPLASTY;  Surgeon: Nita Sells, MD;  Location: [place];  Service: Orthopedics;  Laterality: Left;  Left total shoulder arthroplasty  . Anterior cervical decomp/discectomy fusion N/A 05/09/2013    Procedure: ANTERIOR CERVICAL DECOMPRESSION/DISCECTOMY FUSION CERVICAL THREE-FOUR,CERVICAL FOUR-FIVE,CERVICAL  FIVE-SIX;  Surgeon: Floyce Stakes, MD;  Location: [facility];  Service: Neurosurgery;  Laterality: N/A;  . Penile prosthesis implant N/A 07/02/2014    Procedure: PENILE PROTHESIS INFLATABLE 3 PIECE (COLOPLAST) SCROTAL APPROACH;  Surgeon: Kathie Rhodes, MD;  Location: [facility];  Service: Urology;  Laterality: N/A;  . Esophagogastroduodenoscopy (egd) with propofol N/A 09/21/2014    Procedure: ESOPHAGOGASTRODUODENOSCOPY (EGD) WITH PROPOFOL;  Surgeon: Manya Silvas, MD;  Location: [facility];  Service: Endoscopy;  Laterality: N/A;  . Penile prosthesis implant N/A 11/23/2014    Procedure: EXPLORATION AND REVISION OF PENILE PROSTHESIS;  Surgeon: Kathie Rhodes, MD;  Location: [facility];  Service: Urology;  Laterality: N/A;    Current Outpatient Rx  Name  Route  Sig  Dispense  Refill  . atorvastatin (LIPITOR) 80 MG tablet      Take 1 tablet by mouth at  bedtime   90 tablet   0   . FLUoxetine (PROZAC) 40 MG capsule      Take 1 capsule by mouth  daily   90 capsule   0   . furosemide (LASIX) 20 MG tablet      Take 1 to 2 tablets by  mouth daily   180 tablet   1   . gabapentin (NEURONTIN) 300 MG capsule   Oral    Take 300 mg by mouth at bedtime.          [name] lisinopril (PRINIVIL,ZESTRIL) 2.5 MG tablet   Oral   Take 2.5 mg by mouth every morning.          . metFORMIN (GLUCOPHAGE) 500 MG tablet   Oral   Take 500 mg by mouth 2 (two) times daily with a meal.         . primidone (MYSOLINE) 50 MG tablet      TAKE 2 TABLETS BY MOUTH IN THE MORNING, 1 TABLET IN THE EVENING   270 tablet   1   . silodosin (RAPAFLO) 8 MG CAPS capsule   Oral   Take 8 mg by mouth daily with breakfast.         . warfarin (COUMADIN) 2.5 MG tablet      Take 1 to 1.5 tablets by mouth daily or as directed by coumadin clinic Patient taking differently: Take 2.5 mg by mouth daily at 6 PM.    105 tablet   1     90 day supply   . zolpidem (AMBIEN) 10 MG tablet      TAKE 1/2-1 TABLET BY MOUTH AT BEDTIME   30 tablet   0     Not to exceed 5 additional fills before 07/13/2015     Allergies Sulfacetamide sodium and Adhesive  Family History  Problem Relation Age of Onset  . Uterine cancer Mother     mets  . Breast cancer Mother   . Cancer Mother     cervical  . Emphysema Father   . Alzheimer's disease Sister   . Prostate cancer Brother   . Heart attack Brother   . Colon cancer Brother 49    Social History Social History  Substance Use Topics  . Smoking status: Never Smoker   . Smokeless tobacco: Never Used  . Alcohol Use: No    Review of Systems  Constitutional: Negative for fever. Cardiovascular: Negative for chest pain. Respiratory: Negative for shortness of breath. Gastrointestinal: Positive for abdominal pain Genitourinary: Negative for dysuria. Neurological: Negative for headaches, focal weakness or numbness.   10-point ROS otherwise  negative.  ____________________________________________   PHYSICAL EXAM:  VITAL SIGNS: ED Triage Vitals  Enc Vitals Group     BP 02/28/15 1825 113/77 mmHg     Pulse Rate 02/28/15 1825 84     Resp 02/28/15 1825 16     Temp 02/28/15 1825 97.8  F (36.6 C)     Temp Source 02/28/15 1825 Oral     SpO2 02/28/15 1825 99 %     Weight 02/28/15 1825 180 lb (81.647 kg)     Height 02/28/15 1825 6\' 2"  (1.88 m)     Head Cir --      Peak Flow --      Pain Score 02/28/15 1827 7   Constitutional: Alert and oriented. Well appearing and in no distress. Eyes: Conjunctivae are normal. PERRL. Normal extraocular movements. ENT   Head: Normocephalic and atraumatic.   Nose: No congestion/rhinnorhea.   Mouth/Throat: Mucous membranes are moist.   Neck: No stridor. Hematological/Lymphatic/Immunilogical: No cervical lymphadenopathy. Cardiovascular: Normal rate, regular rhythm.  No murmurs, rubs, or gallops. Respiratory: Normal respiratory effort without tachypnea nor retractions. Breath sounds are clear and equal bilaterally. No wheezes/rales/rhonchi. Gastrointestinal: Soft. Diffusely tender to palpation. No distention. There is no CVA tenderness. Genitourinary: Deferred Musculoskeletal: Normal range of motion in all extremities. No joint effusions.  No lower extremity tenderness nor edema. Neurologic:  Normal speech and language. No gross focal neurologic deficits are appreciated.  Skin:  Skin is warm, dry and intact. No rash noted. Psychiatric: Mood and affect are normal. Speech and behavior are normal. Patient exhibits appropriate insight and judgment.  ____________________________________________    LABS (pertinent positives/negatives)  Labs Reviewed  COMPREHENSIVE METABOLIC PANEL - Abnormal; Notable for the following:    CO2 20 (*)    Glucose, Bld 132 (*)    Creatinine, Ser 1.73 (*)    GFR calc non Af Amer 37 (*)    GFR calc Af Amer 43 (*)    All other components within normal limits  CBC - Abnormal; Notable for the following:    RBC 3.73 (*)    Hemoglobin 12.0 (*)    HCT 36.1 (*)    RDW 14.8 (*)    All other components within normal limits  URINALYSIS COMPLETEWITH MICROSCOPIC ([facility] ONLY) - Abnormal; Notable for the  following:    Color, Urine YELLOW (*)    APPearance HAZY (*)    Protein, ur 30 (*)    Leukocytes, UA 2+ (*)    All other components within normal limits  LIPASE, BLOOD     ____________________________________________   EKG  None  ____________________________________________    RADIOLOGY  CT abd/pel  IMPRESSION: Focal twisting of the mesentery in the right upper abdomen with abrupt narrowing of the SMV. There is diffuse mesenteric edema as well as diffuse edema of the small bowel. There is no pneumatosis or evidence of portal venous gas. An early ischemia is not excluded. Clinical correlation and surgical consult is advised.  Postsurgical changes of Roux-en-Y. No evidence of bowel obstruction.  Diverticulosis.  I, Nance Pear, personally discussed these images and results by phone with the on-call radiologist and used this discussion as part of my medical decision making.   ____________________________________________   PROCEDURES  Procedure(s) performed: None  Critical Care performed: No  ____________________________________________   INITIAL IMPRESSION / ASSESSMENT AND PLAN / ED COURSE  Pertinent labs & imaging results that were available during my care of the patient were reviewed by me and considered in my medical decision making (  see chart for details).  Patient presented to the emergency department today because of concerns for abdominal pain. On exam patient diffuse abdominal tenderness. I did obtain a CT scan which was concerning for possible focal twisting of the mesentery. Given this finding I did contact surgery who came and evaluated the patient. They will plan on admitting the patient to the hospital.  ____________________________________________   FINAL CLINICAL IMPRESSION(S) / ED DIAGNOSES  Final diagnoses:  Abdominal pain, unspecified abdominal location     Nance Pear, MD 02/28/15 2309

## 2015-02-28 NOTE — ED Notes (Signed)
Lab called for add on of urine culture

## 2015-02-28 NOTE — H&P (Signed)
Patient ID: [name], male   DOB: [birth year], 75 y.o.   MRN: [id number]  CC: ABDOMINAL PAIN  HPI [name] is a 75 y.o. male who presents to the emergency department today for evaluation of abdominal pain that is present for the last 2 days. Patient states that starting yesterday he started to have a sharp abdominal pain that caused him to double over at home. He came on suddenly and without correlation with the eating or drinking. Since the pain onset he then had nausea and vomiting followed by diarrhea. Patient states he's had something similar to this but not as severe over the last couple of years. Of note, patient is 3-1/2 years status post gastric bypass performed at an outside facility in [place], and he states he does not desire to return there. Patient has loss over 100 pounds since his gastric bypass and has transitioned from a poorly controlled diabetic requiring insulin to now having intermittent hypoglycemic episodes requiring rescue sugars. Patient has also had resolution of his hypertension with weight loss. Patient states that he called his gastrologist with his complaints of abdominal pain and was directed to come to the emergency department. His pain complaints currently include lower abdominal pain and low back pain. Patient has not had any diarrhea or nausea vomiting since yesterday. The pain continues to come in waves but is constantly sharp. He is also had a decrease in appetite since the onset of pain. Patient denies any fevers, chills, chest pain, shortness of breath.  HPI  Past Medical History  Diagnosis Date  . Diverticulosis of colon (without mention of hemorrhage)   . Restless legs syndrome (RLS)   . Anxiety state, unspecified   . Hx of gout     but doesn't take any meds  . Bruises easily     pt is on Coumadin  . Cancer ([place])     SKIN CANCER REMOVED  . Depressive disorder, not elsewhere classified     takes Prozac daily  . History of blood clots     behind right knee and then went into left lung 60yrs ago  . Peripheral edema     takes Furosemide daily  . Unspecified asthma(493.90)     as a child  . Pneumonia     last time about 43yrs ago  . Joint pain   . History of colon polyps   . History of kidney stones   . Hard of hearing     wears hearing aids  . Chronic atrial fibrillation (HCC)     a. on warfarin  . Peripheral vascular disease ([place])   . Pulmonary embolism ([place])     over 10 yrs ago "many years ago"  . ED (erectile dysfunction)   . Hx of diabetes mellitus     "no longer diabetic since gastric bypass" -  . Unspecified essential hypertension     hx of no longer on medication due to gastric bypass   . GERD (gastroesophageal reflux disease)   . Arthritis     hands     Past Surgical History  Procedure Laterality Date  . Knee surgery Right     x 4  . Artery repair      Left forearm  . Foot surgery      Left foot   . Replacement total knee Left     x 2  . Breath tek h pylori  01/08/2011    Procedure: BREATH TEK H PYLORI;  Surgeon: Isabel Caprice  Hassell Done, MD;  Location: [facility];  Service: General;  Laterality: N/A;  . Hand surgery      LEFT  . Leg surgery      FOR NECROTIZING FASCITIS L LEG AND GROIN  . 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 Shoulder arthroscopy    . Shoulder surgery Left 2014  . Back surgery      x 3  . Knee surgery Left     arthroscopy  . Eye surgery      cataract bil  . Injections in back      x 18  . Colonoscopy    . Cystoscopy    . Total shoulder arthroplasty Left 01/26/2013    Procedure: TOTAL SHOULDER ARTHROPLASTY;  Surgeon: Nita Sells, MD;  Location: [facility];  Service: Orthopedics;  Laterality: Left;  Left total shoulder arthroplasty  . Anterior cervical decomp/discectomy fusion N/A 05/09/2013    Procedure: ANTERIOR  CERVICAL DECOMPRESSION/DISCECTOMY FUSION CERVICAL THREE-FOUR,CERVICAL FOUR-FIVE,CERVICAL FIVE-SIX;  Surgeon: Floyce Stakes, MD;  Location: [facility];  Service: Neurosurgery;  Laterality: N/A;  . Penile prosthesis implant N/A 07/02/2014    Procedure: PENILE PROTHESIS INFLATABLE 3 PIECE (COLOPLAST) SCROTAL APPROACH;  Surgeon: Kathie Rhodes, MD;  Location: [facility];  Service: Urology;  Laterality: N/A;  . Esophagogastroduodenoscopy (egd) with propofol N/A 09/21/2014    Procedure: ESOPHAGOGASTRODUODENOSCOPY (EGD) WITH PROPOFOL;  Surgeon: Manya Silvas, MD;  Location: [facility];  Service: Endoscopy;  Laterality: N/A;  . Penile prosthesis implant N/A 11/23/2014    Procedure: EXPLORATION AND REVISION OF PENILE PROSTHESIS;  Surgeon: Kathie Rhodes, MD;  Location: [facility];  Service: Urology;  Laterality: N/A;    Family History  Problem Relation Age of Onset  . Uterine cancer Mother     mets  . Breast cancer Mother   . Cancer Mother     cervical  . Emphysema Father   . Alzheimer's disease Sister   . Prostate cancer Brother   . Heart attack Brother   . Colon cancer Brother 20    Social History Social History  Substance Use Topics  . Smoking status: Never Smoker   . Smokeless tobacco: Never Used  . Alcohol Use: No    Allergies  Allergen Reactions  . Sulfacetamide Sodium     REACTION: Throat swelling  . Adhesive [Tape]     Tears skin-- "No tape of any kind, they all tear skin right off"    No current facility-administered medications for this encounter.   Current Outpatient Prescriptions  Medication Sig Dispense Refill  . atorvastatin (LIPITOR) 80 MG tablet Take 1 tablet by mouth at  bedtime 90 tablet 0  . FLUoxetine (PROZAC) 40 MG capsule Take 1 capsule by mouth  daily 90 capsule 0  . furosemide (LASIX) 20 MG tablet Take 1 to 2 tablets by  mouth daily 180 tablet 1  . gabapentin (NEURONTIN) 300 MG capsule Take 300 mg by mouth at bedtime.     [name] lisinopril (PRINIVIL,ZESTRIL) 2.5  MG tablet Take 2.5 mg by mouth every morning.     . metFORMIN (GLUCOPHAGE) 500 MG tablet Take 500 mg by mouth 2 (two) times daily with a meal.    . primidone (MYSOLINE) 50 MG tablet TAKE 2 TABLETS BY MOUTH IN THE MORNING, 1 TABLET IN THE EVENING 270 tablet 1  . silodosin (RAPAFLO) 8 MG  CAPS capsule Take 8 mg by mouth daily with breakfast.    . warfarin (COUMADIN) 2.5 MG tablet Take 1 to 1.5 tablets by mouth daily or as directed by coumadin clinic (Patient taking differently: Take 2.5 mg by mouth daily at 6 PM. ) 105 tablet 1  . zolpidem (AMBIEN) 10 MG tablet TAKE 1/2-1 TABLET BY MOUTH AT BEDTIME 30 tablet 0     Review of Systems A multi-point review of systems was asked and was negative except for the findings documented in the history of present illness  Physical Exam Blood pressure 121/83, pulse 84, temperature 97.8 F (36.6 C), temperature source Oral, resp. rate 16, height 6\' 2"  (1.88 m), weight 81.647 kg (180 lb), SpO2 99 %. CONSTITUTIONAL: Resting in bed in no acute distress. EYES: Pupils are equal, round, and reactive to light, Sclera are non-icteric. EARS, NOSE, MOUTH AND THROAT: The oropharynx is clear. The oral mucosa is pink and moist. Hearing is intact to voice. LYMPH NODES:  Lymph nodes in the neck are normal. RESPIRATORY:  Lungs are clear. There is normal respiratory effort, with equal breath sounds bilaterally, and without pathologic use of accessory muscles. CARDIOVASCULAR: Heart is regular without murmurs, gallops, or rubs. GI: The abdomen is soft, tender to deep palpation in the central abdomen without any rebound or guarding, and nondistended. There are no palpable masses. There is no hepatosplenomegaly. There are normal bowel sounds in all quadrants. There are multiple well-healed laparoscopic incision sites and a noticeable amount of excess skin over the entirety of his abdomen. There is no costovertebral angle tenderness. GU: Rectal deferred.   MUSCULOSKELETAL: Normal  muscle strength and tone. No cyanosis or edema.   SKIN: Turgor is good and there are no pathologic skin lesions or ulcers. There are multiple bruises to the upper extremities that appear to be correlated to attempted IV access. NEUROLOGIC: Motor and sensation is grossly normal. Cranial nerves are grossly intact. PSYCH:  Oriented to person, place and time. Affect is normal.  Data Reviewed I personally reviewed the patient's images and labs. CT scan does show an apparent new twisting of the mesentery of the small bowel. It is difficult to follows anatomy secondary to his gastric bypass state. There is no evidence of free air, pneumatosis, venous gas. There is evidence of some mesenteric edema associated with the twisting of the mesenteric vasculature. His labs are concerning for evidence of a urinary tract infection with leukocytes and white blood cells seen in his urinalysis, a supratherapeutic INR at 3.4, and a mildly elevated creatinine to 1.73. I have personally reviewed the patient's imaging, laboratory findings and medical records.    Assessment    75 year old male with abdominal pain    Plan    Had a long conversation with the patient and his wife about the CT findings that could represent a source for his abdominal pain. Given the twisting of the mesentery it is possible to be causing decreased blood flow to a section of the patient's small bowel. However, the patient does not appear to be in any severe distress during the time of my evaluation. And, although he is tender on my exam is not out of proportion and without evidence of peritonitis. It should be noted however that abdominal exams and patients after extreme weight loss from bariatric surgery can be deceiving. Given this clinical picture discussed with the patient and his wife about whether or not he should be transferred to a larger facility that has bariatric surgeons versus staying  here. Patient voiced a desire to stay locally as  well as a desire not to return to [place]. Given the patient's chronic anticoagulation state, discussed the plan of admission with IV hydration, vitamin K administration, serial abdominal exams and labs. We'll plan to start on IV antibiotics to treat for a possible intra-abdominal infection secondary to his mesenteric edema and his infected urinalysis. Discussed that should his pain not resolve in 36-48 hours than he would likely require operative intervention. Also discussed that he may require Coumadin reversal products if he were to require urgent surgery prior to the effective vitamin K. In addition, discussed with the patient if that any point he appears to exceed the capabilities of this facility that we would discuss transfer to a larger tertiary care center.     Time spent with the patient was 60 minutes, with more than 50% of the time spent in face-to-face education, counseling and care coordination.     Clayburn Pert, MD FACS General Surgeon 02/28/2015, 10:58 PM

## 2015-02-28 NOTE — ED Notes (Addendum)
C/o lower abdominal and low back pain.  Onset yesterday at around 1000.  With episode patient had some N/V/D.  Went to [facility] today, and sent to ED for evaluation.

## 2015-03-01 ENCOUNTER — Inpatient Hospital Stay: Payer: Medicare Other

## 2015-03-01 LAB — CBC
HEMATOCRIT: 35.7 % — AB (ref 40.0–52.0)
HEMOGLOBIN: 12 g/dL — AB (ref 13.0–18.0)
MCH: 32.9 pg (ref 26.0–34.0)
MCHC: 33.7 g/dL (ref 32.0–36.0)
MCV: 97.7 fL (ref 80.0–100.0)
Platelets: 198 10*3/uL (ref 150–440)
RBC: 3.65 MIL/uL — ABNORMAL LOW (ref 4.40–5.90)
RDW: 14.9 % — AB (ref 11.5–14.5)
WBC: 10.9 10*3/uL — AB (ref 3.8–10.6)

## 2015-03-01 LAB — GLUCOSE, CAPILLARY
GLUCOSE-CAPILLARY: 118 mg/dL — AB (ref 65–99)
GLUCOSE-CAPILLARY: 121 mg/dL — AB (ref 65–99)
GLUCOSE-CAPILLARY: 98 mg/dL (ref 65–99)
Glucose-Capillary: 128 mg/dL — ABNORMAL HIGH (ref 65–99)
Glucose-Capillary: 133 mg/dL — ABNORMAL HIGH (ref 65–99)
Glucose-Capillary: 122 mg/dL — ABNORMAL HIGH (ref 65–99)

## 2015-03-01 LAB — COMPREHENSIVE METABOLIC PANEL
ALT: 45 U/L (ref 17–63)
ANION GAP: 8 (ref 5–15)
AST: 26 U/L (ref 15–41)
Albumin: 3.9 g/dL (ref 3.5–5.0)
Alkaline Phosphatase: 61 U/L (ref 38–126)
BUN: 16 mg/dL (ref 6–20)
CALCIUM: 8.3 mg/dL — AB (ref 8.9–10.3)
CO2: 18 mmol/L — AB (ref 22–32)
Chloride: 108 mmol/L (ref 101–111)
Creatinine, Ser: 1.39 mg/dL — ABNORMAL HIGH (ref 0.61–1.24)
GFR, EST AFRICAN AMERICAN: 56 mL/min — AB (ref >60)
GFR, EST NON AFRICAN AMERICAN: 49 mL/min — AB (ref >60)
Glucose, Bld: 152 mg/dL — ABNORMAL HIGH (ref 65–99)
Potassium: 3.6 mmol/L (ref 3.5–5.1)
SODIUM: 134 mmol/L — AB (ref 135–145)
Total Bilirubin: 0.6 mg/dL (ref 0.3–1.2)
Total Protein: 6.5 g/dL (ref 6.5–8.1)

## 2015-03-01 LAB — PROTIME-INR
INR: 2.13
PROTHROMBIN TIME: 23.7 s — AB (ref 11.4–15.0)

## 2015-03-01 LAB — MAGNESIUM: MAGNESIUM: 1.8 mg/dL (ref 1.7–2.4)

## 2015-03-01 LAB — PHOSPHORUS: PHOSPHORUS: 3.2 mg/dL (ref 2.5–4.6)

## 2015-03-01 MED ORDER — SODIUM CHLORIDE 0.9 % IV SOLN
1.0000 g | INTRAVENOUS | Status: DC
  Administered 2015-03-01: 1 g via INTRAVENOUS
  Filled 2015-03-01 (×2): qty 1

## 2015-03-01 MED ORDER — INSULIN ASPART 100 UNIT/ML ~~LOC~~ SOLN: 0.0000 [IU] | Freq: Three times a day (TID) | SUBCUTANEOUS | Status: DC

## 2015-03-01 MED ORDER — INSULIN ASPART 100 UNIT/ML ~~LOC~~ SOLN: 0.0000 [IU] | Freq: Every day | SUBCUTANEOUS | Status: DC

## 2015-03-01 NOTE — Progress Notes (Signed)
Plain films today reveal passage of the contrast through the small bowel. He is no longer symptomatic. We will start some clear liquids.  I spoke with him in detail. The family is quite interested in laparoscopic repair if surgery becomes necessary. Because of the chronicity of this particular set of symptoms I wonder if he doesn't have a partial small bowel obstruction on an ongoing basis. If he is able tolerate a diet and be discharged I would recommend follow-up with his bariatric group in order to consider a laparoscopic solution to this current problem. They are going to consider that option.

## 2015-03-01 NOTE — Progress Notes (Signed)
Per Dr. Pat Patrick do not give 2 units of insulin for blood glucose of 133.

## 2015-03-01 NOTE — Progress Notes (Signed)
Per Dr. Pat Patrick hold insulin coverage for blood glucose of 122.

## 2015-03-01 NOTE — Progress Notes (Signed)
Subjective:   He is still having moderate abdominal pain but no nausea or vomiting this morning. He does pass gas. He is having problems like this episode intermittently over the last several years. CT scan did not show any significant obstruction but revealed possibility of a mesenteric compromise. His pain is much improved this morning.  Vital signs in last 24 hours: Temp:  [97.4 F (36.3 C)-98 F (36.7 C)] 98 F (36.7 C) (01/20 0409) Pulse Rate:  [54-84] 54 (01/20 0409) Resp:  [16-18] 18 (01/20 0409) BP: (101-126)/(64-84) 101/64 mmHg (01/20 0409) SpO2:  [99 %-100 %] 99 % (01/20 0409) Weight:  [81.647 kg (180 lb)-83.371 kg (183 lb 12.8 oz)] 83.371 kg (183 lb 12.8 oz) (01/19 2335) Last BM Date: 02/28/15  Intake/Output from previous day: 01/19 0701 - 01/20 0700 In: 0  Out: 250 [Urine:250]  Exam:  His abdomen is soft with minimal abdominal tenderness which is quite generalized. He has active bowel sounds. Does not have any obvious abdominal wall hernias. He has no rebound or guarding.  Lab Results:  CBC  Recent Labs  02/28/15 1830 03/01/15 0541  WBC 10.2 10.9*  HGB 12.0* 12.0*  HCT 36.1* 35.7*  PLT 213 198   CMP     Component Value Date/Time   NA 134* 03/01/2015 0541   K 3.6 03/01/2015 0541   CL 108 03/01/2015 0541   CO2 18* 03/01/2015 0541   GLUCOSE 152* 03/01/2015 0541   BUN 16 03/01/2015 0541   CREATININE 1.39* 03/01/2015 0541   CALCIUM 8.3* 03/01/2015 0541   PROT 6.5 03/01/2015 0541   ALBUMIN 3.9 03/01/2015 0541   AST 26 03/01/2015 0541   ALT 45 03/01/2015 0541   ALKPHOS 61 03/01/2015 0541   BILITOT 0.6 03/01/2015 0541   GFRNONAA 49* 03/01/2015 0541   GFRAA 56* 03/01/2015 0541   PT/INR  Recent Labs  02/28/15 1830 03/01/15 0541  LABPROT 33.8* 23.7*  INR 3.42 2.13    Studies/Results: Ct Abdomen Pelvis W Contrast  02/28/2015  CLINICAL DATA:  75 year old male with lower abdominal pain and some nausea vomiting and diarrhea. EXAM: CT ABDOMEN AND PELVIS  WITH CONTRAST TECHNIQUE: Multidetector CT imaging of the abdomen and pelvis was performed using the standard protocol following bolus administration of intravenous contrast. CONTRAST:  81mL OMNIPAQUE IOHEXOL 300 MG/ML  SOLN COMPARISON:  CT dated 08/08/2014 and 11/17/2012 FINDINGS: There is emphysematous changes of the lung bases. No intra-abdominal free air. Small free fluid within the pelvis. Small left hepatic hypodense lesions appear stable compared to the prior study and likely represents cyst or hemangioma. These lesions are not well characterized on this study. The gallbladder is mildly distended. No calcified stone or pericholecystic fluid. The pancreas, spleen, and the adrenal glands appear unremarkable. There is mild bilateral renal atrophy. A 4 cm stable right renal cyst. Subcentimeter bilateral renal hypodensities are too small to characterize. There is no hydronephrosis on either side. The visualized ureters and urinary bladder appear unremarkable. The prostate and seminal vesicles are grossly unremarkable. 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 Multiple anastomotic sutures noted throughout the bowel. There is inflammatory changes of the loops of small bowel as well as diffuse mesenteric stranding throughout the abdomen. There is no bowel dilatation or evidence of obstruction. No pneumatosis. The sigmoid colon is redundant. There is colonic diverticulosis without active inflammatory changes. Normal appendix. There is aortoiliac atherosclerotic disease. The origins of the celiac axis, SMA, IMA as well as the origins of the renal arteries are patent. No  portal venous gas identified. There is twisting of the mesentery in the upper right hemiabdomen with twisting of the SMA and SMV. There is abrupt high-grade narrowing of the SMV (series 2, image 31 and coronal series 6, image 57). 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 There  is no pneumatosis or portal venous gas. An early ischemia is not excluded. Clinical correlation and surgical consult is advised. There is no adenopathy. A penile pump is noted with reservoir in the right hemipelvis. There is mild diffuse subcutaneous soft tissue stranding of the abdominal wall. There is degenerative changes of the spine. Lower lumbar posterior fixation screws. L1 compression fracture with vertebroplasty. IMPRESSION: Focal twisting of the mesentery in the right upper abdomen with abrupt narrowing of the SMV. There is diffuse mesenteric edema as well as diffuse edema of the small bowel. There is no pneumatosis or evidence of portal venous gas. An early ischemia is not excluded. Clinical correlation and surgical consult is advised. Postsurgical changes of Roux-en-Y. No evidence of bowel obstruction. Diverticulosis. These results were called by telephone at the time of interpretation on 02/28/2015 at 9:40 pm to Dr. Nance Pear , who verbally acknowledged these results. Electronically Signed   By: Anner Crete M.D.   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 We will repeat his films today to be certain that his obstruction has not developed. I do not see any surgical indications at the present time. Should his symptoms worsen he may need to be considered for transfer versus laparoscopic intervention.

## 2015-03-02 LAB — GLUCOSE, CAPILLARY
GLUCOSE-CAPILLARY: 106 mg/dL — AB (ref 65–99)
GLUCOSE-CAPILLARY: 80 mg/dL (ref 65–99)
GLUCOSE-CAPILLARY: 84 mg/dL (ref 65–99)
Glucose-Capillary: 109 mg/dL — ABNORMAL HIGH (ref 65–99)

## 2015-03-02 MED ORDER — HYDROCODONE-ACETAMINOPHEN 5-325 MG PO TABS
1.0000 | ORAL_TABLET | ORAL | Status: DC | PRN
  Administered 2015-03-02 (×2): 2 via ORAL
  Administered 2015-03-02: 1 via ORAL
  Administered 2015-03-03: 2 via ORAL
  Filled 2015-03-02: qty 2
  Filled 2015-03-02 (×4): qty 1
  Filled 2015-03-02: qty 2

## 2015-03-02 MED ORDER — ZOLPIDEM TARTRATE 5 MG PO TABS: 10.0000 mg | ORAL_TABLET | Freq: Every evening | ORAL | Status: DC | PRN

## 2015-03-02 MED ORDER — CIPROFLOXACIN HCL 250 MG PO TABS
500.0000 mg | ORAL_TABLET | Freq: Two times a day (BID) | ORAL | Status: DC
  Administered 2015-03-02 – 2015-03-03 (×3): 500 mg via ORAL
  Filled 2015-03-02 (×3): qty 2

## 2015-03-02 MED ORDER — ZOLPIDEM TARTRATE 5 MG PO TABS
5.0000 mg | ORAL_TABLET | Freq: Every evening | ORAL | Status: DC | PRN
  Administered 2015-03-02: 5 mg via ORAL
  Filled 2015-03-02: qty 1

## 2015-03-02 NOTE — Progress Notes (Signed)
CC: Abdominal pain Subjective: Patient reports that his pain is much improved from admission. He has been tolerating a clear liquid diet with bowel function. He has not had any nausea or vomiting over the last 24 hours. He desires to eat real food.  Objective: Vital signs in last 24 hours: Temp:  [97.5 F (36.4 C)-97.9 F (36.6 C)] 97.5 F (36.4 C) (01/21 0507) Pulse Rate:  [60-83] 83 (01/21 0507) Resp:  [16-18] 18 (01/21 0507) BP: (97-106)/(52-69) 97/56 mmHg (01/21 0507) SpO2:  [99 %-100 %] 100 % (01/21 0507) Last BM Date: 02/28/15  Intake/Output from previous day: 01/20 0701 - 01/21 0700 In: 2739.3 [I.V.:2739.3] Out: 800 [Urine:800] Intake/Output this shift: Total I/O In: 1056.6 [P.O.:700; I.V.:356.6] Out: 150 [Urine:150]  Physical exam:  Gen.: No acute distress  chest: Clear to auscultation Heart: Regular rhythm Abdomen: Soft, nondistended, minimally tender to palpation on deep palpation only.  Lab Results: CBC   Recent Labs  02/28/15 1830 03/01/15 0541  WBC 10.2 10.9*  HGB 12.0* 12.0*  HCT 36.1* 35.7*  PLT 213 198   BMET  Recent Labs  02/28/15 1830 03/01/15 0541  NA 139 134*  K 3.9 3.6  CL 111 108  CO2 20* 18*  GLUCOSE 132* 152*  BUN 18 16  CREATININE 1.73* 1.39*  CALCIUM 9.0 8.3*   PT/INR  Recent Labs  02/28/15 1830 03/01/15 0541  LABPROT 33.8* 23.7*  INR 3.42 2.13   ABG No results for input(s): PHART, HCO3 in the last 72 hours.  Invalid input(s): PCO2, PO2  Studies/Results: Ct Abdomen Pelvis W Contrast  02/28/2015  CLINICAL DATA:  75 year old male with lower abdominal pain and some nausea vomiting and diarrhea. EXAM: CT ABDOMEN AND PELVIS WITH CONTRAST TECHNIQUE: Multidetector CT imaging of the abdomen and pelvis was performed using the standard protocol following bolus administration of intravenous contrast. CONTRAST:  25mL OMNIPAQUE IOHEXOL 300 MG/ML  SOLN COMPARISON:  CT dated 08/08/2014 and 11/17/2012 FINDINGS: There is  emphysematous changes of the lung bases. No intra-abdominal free air. Small free fluid within the pelvis. Small left hepatic hypodense lesions appear stable compared to the prior study and likely represents cyst or hemangioma. These lesions are not well characterized on this study. The gallbladder is mildly distended. No calcified stone or pericholecystic fluid. The pancreas, spleen, and the adrenal glands appear unremarkable. There is mild bilateral renal atrophy. A 4 cm stable right renal cyst. Subcentimeter bilateral renal hypodensities are too small to characterize. There is no hydronephrosis on either side. The visualized ureters and urinary bladder appear unremarkable. The prostate and seminal vesicles are grossly unremarkable. 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 Multiple anastomotic sutures noted throughout the bowel. There is inflammatory changes of the loops of small bowel as well as diffuse mesenteric stranding throughout the abdomen. There is no bowel dilatation or evidence of obstruction. No pneumatosis. The sigmoid colon is redundant. There is colonic diverticulosis without active inflammatory changes. Normal appendix. There is aortoiliac atherosclerotic disease. The origins of the celiac axis, SMA, IMA as well as the origins of the renal arteries are patent. No portal venous gas identified. There is twisting of the mesentery in the upper right hemiabdomen with twisting of the SMA and SMV. There is abrupt high-grade narrowing of the SMV (series 2, image 31 and coronal series 6, image 57). 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 There is no pneumatosis or portal venous gas. An early ischemia is not excluded. Clinical correlation  and surgical consult is advised. There is no adenopathy. A penile pump is noted with reservoir in the right hemipelvis. There is mild diffuse subcutaneous soft tissue stranding of the abdominal  wall. There is degenerative changes of the spine. Lower lumbar posterior fixation screws. L1 compression fracture with vertebroplasty. IMPRESSION: Focal twisting of the mesentery in the right upper abdomen with abrupt narrowing of the SMV. There is diffuse mesenteric edema as well as diffuse edema of the small bowel. There is no pneumatosis or evidence of portal venous gas. An early ischemia is not excluded. Clinical correlation and surgical consult is advised. Postsurgical changes of Roux-en-Y. No evidence of bowel obstruction. Diverticulosis. These results were called by telephone at the time of interpretation on 02/28/2015 at 9:40 pm to Dr. Nance Pear , who verbally acknowledged these results. Electronically Signed   By: Anner Crete M.D.   On: 02/28/2015 21:44   Dg Abd 2 Views  03/01/2015  CLINICAL DATA:  Small bowel obstruction.  Abdominal pain EXAM: ABDOMEN - 2 VIEW COMPARISON:  02/28/2015 CT FINDINGS: Nonobstructed bowel-gas pattern. Mildly prominent large bowel loop in the upper abdomen, likely mild gaseous distention of the transverse colon. No free air organomegaly. Postoperative changes in the region of the SI joints and lower lumbar spine. Prior vertebroplasty at L1. IMPRESSION: Nonspecific, nonobstructive bowel gas pattern.  No free air. Electronically Signed   By: Rolm Baptise M.D.   On: 03/01/2015 10:30    Anti-infectives: Anti-infectives    Start     Dose/Rate Route Frequency Ordered Stop   03/02/15 1200  ciprofloxacin (CIPRO) tablet 500 mg     500 mg Oral 2 times daily 03/02/15 1159     03/01/15 1800  ertapenem (INVANZ) 1 g in sodium chloride 0.9 % 50 mL IVPB  Status:  Discontinued     1 g 100 mL/hr over 30 Minutes Intravenous Every 24 hours 03/01/15 1611 03/02/15 1159   02/28/15 2345  ertapenem (INVANZ) 1 g in sodium chloride 0.9 % 50 mL IVPB  Status:  Discontinued     1 g 100 mL/hr over 30 Minutes Intravenous Every 24 hours 02/28/15 2337 03/01/15 1611       Assessment/Plan:  75 year old male with abdominal pain that has mostly resolved. CT scan does show concern for possible vascular compromise from adhesions or possible internal hernia. Given his clinical improvement and is becoming less and less likelihood he would require urgent surgical intervention or urgent transfer to tertiary care center. Plan to start a regular diet, transitioned to oral medications, saline lock IV. If he continues to do well possible discharge home later today versus tomorrow morning.  Quinn Quam T. Adonis Huguenin, MD, FACS  03/02/2015

## 2015-03-02 NOTE — Progress Notes (Signed)
Dr. Adonis Huguenin was contacted and notified that [name] is having abdominal pain 6-7/10.  A telephone order for Norco 5/325mg  was given.

## 2015-03-02 NOTE — Progress Notes (Signed)
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  Called by nursing due to some increasing pain.  Patient states his pain went away with Norco and good sized BM followed.  He denies any pain at present. States he was able to eat some fish, mac'n'cheese and jello tonight about 30 mins before the pain.   Filed Vitals:   03/02/15 0507 03/02/15 1328  BP: 97/56 99/56  Pulse: 83 67  Temp: 97.5 F (36.4 C) 97.7 F (36.5 C)  Resp: 18 18    PE:  Gen: NAD Abd: soft, minimal tenderness at epigastrium, non-distended Ext: 2+ pulses, no edema  A/P:  Pain relieved after meds and BM, ? Pain from partial obstruction vs. Just pain with BM.  He has no pain at this time and would like to be discharged tomorrow.  Also states that he takes Ambien at home for sleep and requesting this now.

## 2015-03-03 LAB — URINE CULTURE: Culture: 80000

## 2015-03-03 LAB — GLUCOSE, CAPILLARY: Glucose-Capillary: 118 mg/dL — ABNORMAL HIGH (ref 65–99)

## 2015-03-03 MED ORDER — CIPROFLOXACIN HCL 500 MG PO TABS: 500.0000 mg | ORAL_TABLET | Freq: Two times a day (BID) | ORAL | Status: DC

## 2015-03-03 MED ORDER — HYDROCODONE-ACETAMINOPHEN 5-325 MG PO TABS: 1.0000 | ORAL_TABLET | ORAL | Status: DC | PRN

## 2015-03-03 NOTE — Discharge Instructions (Signed)
Take medications as prescribed.  It is important to take your antibiotic every day as prescribed, until complete, even if you are feeling better.  Please contact Dr. Reginal Lutes office for increased pain, nausea, vomiting, fever, diarrhea and if you should have any questions or concerns.  Abdominal Pain, Adult Many things can cause abdominal pain. Usually, abdominal pain is not caused by a disease and will improve without treatment. It can often be observed and treated at home. Your health care provider will do a physical exam and possibly order blood tests and X-rays to help determine the seriousness of your pain. However, in many cases, more time must pass before a clear cause of the pain can be found. Before that point, your health care provider may not know if you need more testing or further treatment. HOME CARE INSTRUCTIONS Monitor your abdominal pain for any changes. The following actions may help to alleviate any discomfort you are experiencing:  Only take over-the-counter or prescription medicines as directed by your health care provider.  Do not take laxatives unless directed to do so by your health care provider.  Try a clear liquid diet (broth, tea, or water) as directed by your health care provider. Slowly move to a bland diet as tolerated. SEEK MEDICAL CARE IF:  You have unexplained abdominal pain.  You have abdominal pain associated with nausea or diarrhea.  You have pain when you urinate or have a bowel movement.  You experience abdominal pain that wakes you in the night.  You have abdominal pain that is worsened or improved by eating food.  You have abdominal pain that is worsened with eating fatty foods.  You have a fever. SEEK IMMEDIATE MEDICAL CARE IF:  Your pain does not go away within 2 hours.  You keep throwing up (vomiting).  Your pain is felt only in portions of the abdomen, such as the right side or the left lower portion of the abdomen.  You pass bloody or  black tarry stools. MAKE SURE YOU:  Understand these instructions.  Will watch your condition.  Will get help right away if you are not doing well or get worse.   This information is not intended to replace advice given to you by your health care provider. Make sure you discuss any questions you have with your health care provider.   Document Released: 11/05/2004 Document Revised: 10/17/2014 Document Reviewed: 10/05/2012 Elsevier Interactive Patient Education 2016 [employer]

## 2015-03-03 NOTE — Discharge Summary (Signed)
Patient ID: [name] MRN: [id number] DOB/AGE: Sep 27, 1940 75 y.o.  Admit date: 02/28/2015 Discharge date: 03/03/2015  Discharge Diagnoses:  Abdominal pain  Procedures Performed: None  Discharged Condition: good  Hospital Course: 75 year old male admitted to the surgery service with abdominal pain and a CT scan concerning for mesenteric infection versus compression secondary to adhesions or a Peterson's hernia. Patient had a urinalysis concerning for infection. Patient's abdominal pain improved with IV hydration and antibiotics. Patient is able tolerate diet and having bowel function prior to discharge.  Discharge Orders:  discharge home  Disposition: 01-Home or Self Care  Discharge Medications:    Medication List    TAKE these medications        atorvastatin 80 MG tablet  Commonly known as:  LIPITOR  Take 1 tablet by mouth at  bedtime     ciprofloxacin 500 MG tablet  Commonly known as:  CIPRO  Take 1 tablet (500 mg total) by mouth 2 (two) times daily.     FLUoxetine 40 MG capsule  Commonly known as:  PROZAC  Take 1 capsule by mouth  daily     furosemide 20 MG tablet  Commonly known as:  LASIX  Take 1 to 2 tablets by  mouth daily     gabapentin 300 MG capsule  Commonly known as:  NEURONTIN  Take 300 mg by mouth at bedtime.     HYDROcodone-acetaminophen 5-325 MG tablet  Commonly known as:  NORCO/VICODIN  Take 1-2 tablets by mouth every 4 (four) hours as needed for moderate pain.     lisinopril 2.5 MG tablet  Commonly known as:  PRINIVIL,ZESTRIL  Take 2.5 mg by mouth every morning.     metFORMIN 500 MG tablet  Commonly known as:  GLUCOPHAGE  Take 500 mg by mouth 2 (two) times daily with a meal.     primidone 50 MG tablet  Commonly known as:  MYSOLINE  TAKE 2 TABLETS BY MOUTH IN THE MORNING, 1 TABLET IN THE EVENING     RAPAFLO 8 MG Caps capsule  Generic drug:  silodosin  Take 8 mg by mouth daily with breakfast.     warfarin 2.5 MG tablet   Commonly known as:  COUMADIN  Take 1 to 1.5 tablets by mouth daily or as directed by coumadin clinic     zolpidem 10 MG tablet  Commonly known as:  AMBIEN  TAKE 1/2-1 TABLET BY MOUTH AT BEDTIME        Follwup: Follow-up Information    Follow up with Clayburn Pert, MD. Schedule an appointment as soon as possible for a visit in 1 week.   Specialty:  General Surgery   Why:  post hospital follow up   Contact information:   [address] [place] [address] [phone]       Signed: Clayburn Pert 03/03/2015, 9:55 AM

## 2015-03-04 ENCOUNTER — Telehealth: Payer: Self-pay | Admitting: *Deleted

## 2015-03-04 NOTE — Telephone Encounter (Signed)
Pt is needing cardiac clearance for urologic surgery. He is needing revision of inflatable penile prosthesis on 03-29-15. They also need instructions regarding when the pt may come off coumadin prior to surgery and instructions for bridging medications if required. Will forward for dr hochrein's review

## 2015-03-07 ENCOUNTER — Ambulatory Visit: Payer: Self-pay | Admitting: Surgery

## 2015-03-07 ENCOUNTER — Ambulatory Visit: Payer: Medicare Other | Admitting: General Surgery

## 2015-03-07 ENCOUNTER — Encounter (HOSPITAL_COMMUNITY): Payer: Self-pay

## 2015-03-07 ENCOUNTER — Encounter (HOSPITAL_COMMUNITY)
Admission: RE | Admit: 2015-03-07 | Discharge: 2015-03-07 | Disposition: A | Discharge status: Home / Self Care | Payer: Medicare Other | Source: Ambulatory Visit | Attending: Surgery | Admitting: Surgery

## 2015-03-07 DIAGNOSIS — E1151 Type 2 diabetes mellitus with diabetic peripheral angiopathy without gangrene: Secondary | ICD-10-CM | POA: Diagnosis not present

## 2015-03-07 DIAGNOSIS — Z79899 Other long term (current) drug therapy: Secondary | ICD-10-CM | POA: Diagnosis not present

## 2015-03-07 DIAGNOSIS — G2581 Restless legs syndrome: Secondary | ICD-10-CM | POA: Diagnosis not present

## 2015-03-07 DIAGNOSIS — I10 Essential (primary) hypertension: Secondary | ICD-10-CM | POA: Diagnosis not present

## 2015-03-07 DIAGNOSIS — M109 Gout, unspecified: Secondary | ICD-10-CM | POA: Diagnosis not present

## 2015-03-07 DIAGNOSIS — M19041 Primary osteoarthritis, right hand: Secondary | ICD-10-CM | POA: Diagnosis not present

## 2015-03-07 DIAGNOSIS — F329 Major depressive disorder, single episode, unspecified: Secondary | ICD-10-CM | POA: Diagnosis not present

## 2015-03-07 DIAGNOSIS — J449 Chronic obstructive pulmonary disease, unspecified: Secondary | ICD-10-CM | POA: Diagnosis not present

## 2015-03-07 DIAGNOSIS — H9193 Unspecified hearing loss, bilateral: Secondary | ICD-10-CM | POA: Diagnosis not present

## 2015-03-07 DIAGNOSIS — R600 Localized edema: Secondary | ICD-10-CM | POA: Diagnosis not present

## 2015-03-07 DIAGNOSIS — M4802 Spinal stenosis, cervical region: Secondary | ICD-10-CM | POA: Diagnosis not present

## 2015-03-07 DIAGNOSIS — K458 Other specified abdominal hernia without obstruction or gangrene: Secondary | ICD-10-CM | POA: Diagnosis not present

## 2015-03-07 DIAGNOSIS — Z9884 Bariatric surgery status: Secondary | ICD-10-CM | POA: Diagnosis not present

## 2015-03-07 DIAGNOSIS — D649 Anemia, unspecified: Secondary | ICD-10-CM | POA: Diagnosis not present

## 2015-03-07 DIAGNOSIS — I482 Chronic atrial fibrillation: Secondary | ICD-10-CM | POA: Diagnosis not present

## 2015-03-07 DIAGNOSIS — K469 Unspecified abdominal hernia without obstruction or gangrene: Secondary | ICD-10-CM | POA: Diagnosis not present

## 2015-03-07 DIAGNOSIS — M19042 Primary osteoarthritis, left hand: Secondary | ICD-10-CM | POA: Diagnosis not present

## 2015-03-07 DIAGNOSIS — K660 Peritoneal adhesions (postprocedural) (postinfection): Secondary | ICD-10-CM | POA: Diagnosis not present

## 2015-03-07 DIAGNOSIS — Z7901 Long term (current) use of anticoagulants: Secondary | ICD-10-CM | POA: Diagnosis not present

## 2015-03-07 DIAGNOSIS — Z96612 Presence of left artificial shoulder joint: Secondary | ICD-10-CM | POA: Diagnosis not present

## 2015-03-07 DIAGNOSIS — Z7984 Long term (current) use of oral hypoglycemic drugs: Secondary | ICD-10-CM | POA: Diagnosis not present

## 2015-03-07 DIAGNOSIS — K219 Gastro-esophageal reflux disease without esophagitis: Secondary | ICD-10-CM | POA: Diagnosis not present

## 2015-03-07 DIAGNOSIS — Z79891 Long term (current) use of opiate analgesic: Secondary | ICD-10-CM | POA: Diagnosis not present

## 2015-03-07 DIAGNOSIS — J4530 Mild persistent asthma, uncomplicated: Secondary | ICD-10-CM | POA: Diagnosis not present

## 2015-03-07 DIAGNOSIS — E7800 Pure hypercholesterolemia, unspecified: Secondary | ICD-10-CM | POA: Diagnosis not present

## 2015-03-07 DIAGNOSIS — E1140 Type 2 diabetes mellitus with diabetic neuropathy, unspecified: Secondary | ICD-10-CM | POA: Diagnosis not present

## 2015-03-07 DIAGNOSIS — Z86711 Personal history of pulmonary embolism: Secondary | ICD-10-CM | POA: Diagnosis not present

## 2015-03-07 HISTORY — DX: Noninfective gastroenteritis and colitis, unspecified: K52.9

## 2015-03-07 LAB — COMPREHENSIVE METABOLIC PANEL
ALBUMIN: 4.1 g/dL (ref 3.5–5.0)
ALT: 41 U/L (ref 17–63)
ANION GAP: 7 (ref 5–15)
AST: 32 U/L (ref 15–41)
Alkaline Phosphatase: 68 U/L (ref 38–126)
BUN: 14 mg/dL (ref 6–20)
CO2: 25 mmol/L (ref 22–32)
Calcium: 8.6 mg/dL — ABNORMAL LOW (ref 8.9–10.3)
Chloride: 106 mmol/L (ref 101–111)
Creatinine, Ser: 1.56 mg/dL — ABNORMAL HIGH (ref 0.61–1.24)
GFR calc non Af Amer: 42 mL/min — ABNORMAL LOW (ref >60)
GFR, EST AFRICAN AMERICAN: 49 mL/min — AB (ref >60)
GLUCOSE: 121 mg/dL — AB (ref 65–99)
POTASSIUM: 4.2 mmol/L (ref 3.5–5.1)
SODIUM: 138 mmol/L (ref 135–145)
TOTAL PROTEIN: 6.8 g/dL (ref 6.5–8.1)
Total Bilirubin: 0.3 mg/dL (ref 0.3–1.2)

## 2015-03-07 LAB — CBC WITH DIFFERENTIAL/PLATELET
BASOS PCT: 1 %
Basophils Absolute: 0.1 10*3/uL (ref 0.0–0.1)
EOS ABS: 0.2 10*3/uL (ref 0.0–0.7)
EOS PCT: 3 %
HCT: 39.2 % (ref 39.0–52.0)
Hemoglobin: 12.5 g/dL — ABNORMAL LOW (ref 13.0–17.0)
Lymphocytes Relative: 26 %
Lymphs Abs: 1.7 10*3/uL (ref 0.7–4.0)
MCH: 32.1 pg (ref 26.0–34.0)
MCHC: 31.9 g/dL (ref 30.0–36.0)
MCV: 100.5 fL — ABNORMAL HIGH (ref 78.0–100.0)
MONO ABS: 0.7 10*3/uL (ref 0.1–1.0)
MONOS PCT: 10 %
Neutro Abs: 3.8 10*3/uL (ref 1.7–7.7)
Neutrophils Relative %: 60 %
Platelets: 216 10*3/uL (ref 150–400)
RBC: 3.9 MIL/uL — ABNORMAL LOW (ref 4.22–5.81)
RDW: 14.1 % (ref 11.5–15.5)
WBC: 6.4 10*3/uL (ref 4.0–10.5)

## 2015-03-07 LAB — PROTIME-INR
INR: 1.24 (ref 0.00–1.49)
PROTHROMBIN TIME: 15.2 s (ref 11.6–15.2)

## 2015-03-07 NOTE — Telephone Encounter (Signed)
The patient can stop the warfarin 5 days prior to surgery.  No bridging is needed.  Call Mr. Harren with the results and send results to Eliezer Lofts, MD

## 2015-03-07 NOTE — Pre-Procedure Instructions (Signed)
EKG/ Stress 8'16 Epic. CT abd/pelvis 02-28-15 Epic.

## 2015-03-07 NOTE — H&P (Signed)
Chief Complaint:  Abdominal pain post bypass  History of Present Illness:  [name] is an 75 y.o. male who was seen in the office on 1/26 after being seen in the [facility] ER 1/19 for abdominal pain radiating into the back .  It came on fairly suddenly.  CT scan which was performed there which I  Reviewed suggest a probable internal hernia.  I have explained this to him and his wife and have set this up for tomorrow morning at 7:30.  We'll plany and reduction of internal hernia.  He is aware might have to be done open. He has held his Coumadin for the past 5 days.  Past Medical History  Diagnosis Date  . Diverticulosis of colon (without mention of hemorrhage)   . Restless legs syndrome (RLS)   . Anxiety state, unspecified   . Hx of gout     but doesn't take any meds  . Bruises easily     pt is on Coumadin  . Cancer ([place])     SKIN CANCER REMOVED  . Depressive disorder, not elsewhere classified     takes Prozac daily  . History of blood clots     behind right knee and then went into left lung 55yrs ago  . Peripheral edema     takes Furosemide daily  . Unspecified asthma(493.90)     as a child  . Pneumonia     last time about 50yrs ago  . Joint pain   . History of colon polyps   . History of kidney stones   . Hard of hearing     wears hearing aids  . Chronic atrial fibrillation (HCC)     a. on warfarin  . Peripheral vascular disease ([place])   . Pulmonary embolism ([place])     over 10 yrs ago "many years ago"  . ED (erectile dysfunction)   . Hx of diabetes mellitus     "no longer diabetic since gastric bypass" -  . Unspecified essential hypertension     hx of no longer on medication due to gastric bypass   . GERD (gastroesophageal reflux disease)   . Arthritis     hands     Past Surgical History  Procedure Laterality Date  . Knee surgery Right     x 4  . Artery repair      Left forearm  . Foot surgery      Left foot   . Replacement total knee Left     x 2  . Breath  tek h pylori  01/08/2011    Procedure: BREATH TEK H PYLORI;  Surgeon: Pedro Earls, MD;  Location: [facility];  Service: General;  Laterality: N/A;  . Hand surgery      LEFT  . Leg surgery      FOR NECROTIZING FASCITIS L LEG AND GROIN  . 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 Shoulder arthroscopy    . Shoulder surgery Left 2014  . Back surgery      x 3  . Knee surgery Left     arthroscopy  . Eye surgery      cataract bil  . Injections in back      x 18  . Colonoscopy    . Cystoscopy    . Total shoulder arthroplasty Left 01/26/2013  Procedure: TOTAL SHOULDER ARTHROPLASTY;  Surgeon: Nita Sells, MD;  Location: [facility];  Service: Orthopedics;  Laterality: Left;  Left total shoulder arthroplasty  . Anterior cervical decomp/discectomy fusion N/A 05/09/2013    Procedure: ANTERIOR CERVICAL DECOMPRESSION/DISCECTOMY FUSION CERVICAL THREE-FOUR,CERVICAL FOUR-FIVE,CERVICAL FIVE-SIX;  Surgeon: Floyce Stakes, MD;  Location: [facility];  Service: Neurosurgery;  Laterality: N/A;  . Penile prosthesis implant N/A 07/02/2014    Procedure: PENILE PROTHESIS INFLATABLE 3 PIECE (COLOPLAST) SCROTAL APPROACH;  Surgeon: Kathie Rhodes, MD;  Location: [facility];  Service: Urology;  Laterality: N/A;  . Esophagogastroduodenoscopy (egd) with propofol N/A 09/21/2014    Procedure: ESOPHAGOGASTRODUODENOSCOPY (EGD) WITH PROPOFOL;  Surgeon: Manya Silvas, MD;  Location: [facility];  Service: Endoscopy;  Laterality: N/A;  . Penile prosthesis implant N/A 11/23/2014    Procedure: EXPLORATION AND REVISION OF PENILE PROSTHESIS;  Surgeon: Kathie Rhodes, MD;  Location: [facility];  Service: Urology;  Laterality: N/A;    Current Outpatient Prescriptions  Medication Sig Dispense Refill  . atorvastatin (LIPITOR) 80 MG tablet Take 1 tablet by mouth at  bedtime  90 tablet 0  . ciprofloxacin (CIPRO) 500 MG tablet Take 1 tablet (500 mg total) by mouth 2 (two) times daily. 14 tablet 0  . FLUoxetine (PROZAC) 40 MG capsule Take 1 capsule by mouth  daily 90 capsule 0  . furosemide (LASIX) 20 MG tablet Take 1 to 2 tablets by  mouth daily 180 tablet 1  . gabapentin (NEURONTIN) 300 MG capsule Take 300 mg by mouth at bedtime.     [name] HYDROcodone-acetaminophen (NORCO/VICODIN) 5-325 MG tablet Take 1-2 tablets by mouth every 4 (four) hours as needed for moderate pain. 30 tablet 0  . lisinopril (PRINIVIL,ZESTRIL) 2.5 MG tablet Take 2.5 mg by mouth every morning.     . metFORMIN (GLUCOPHAGE) 500 MG tablet Take 500 mg by mouth 2 (two) times daily with a meal.    . primidone (MYSOLINE) 50 MG tablet TAKE 2 TABLETS BY MOUTH IN THE MORNING, 1 TABLET IN THE EVENING 270 tablet 1  . silodosin (RAPAFLO) 8 MG CAPS capsule Take 8 mg by mouth daily with breakfast.    . warfarin (COUMADIN) 2.5 MG tablet Take 1 to 1.5 tablets by mouth daily or as directed by coumadin clinic (Patient taking differently: Take 2.5 mg by mouth daily at 6 PM. ) 105 tablet 1  . zolpidem (AMBIEN) 10 MG tablet TAKE 1/2-1 TABLET BY MOUTH AT BEDTIME 30 tablet 0   No current facility-administered medications for this visit.   Sulfacetamide sodium and Adhesive Family History  Problem Relation Age of Onset  . Uterine cancer Mother     mets  . Breast cancer Mother   . Cancer Mother     cervical  . Emphysema Father   . Alzheimer's disease Sister   . Prostate cancer Brother   . Heart attack Brother   . Colon cancer Brother 70   Social History:   reports that he has never smoked. He has never used smokeless tobacco. He reports that he does not drink alcohol or use illicit drugs.   REVIEW OF SYSTEMS : Negative except for see long extensive problem list  Physical Exam:   There were no vitals taken for this visit. There is no weight on file to calculate BMI.  Gen:  WDWN white male NAD  Neurological:  Alert and oriented to person, place, and time. Motor and sensory function is grossly intact  Head: Normocephalic and atraumatic.  Eyes: Conjunctivae are normal.  Pupils are equal, round, and reactive to light. No scleral icterus.  Neck: Normal range of motion. Neck supple. No tracheal deviation or thyromegaly present.  Cardiovascular:  SR without murmurs or gallops.  No carotid bruits Breast:  Not examined Respiratory: Effort normal.  No respiratory distress. No chest wall tenderness. Breath sounds normal.  No wheezes, rales or rhonchi.  Abdomen:  Abdomen flat with panniculus mainly of skin.  No rebound or guarding.  No masses palpable. GU:  Prior urologic procedures Musculoskeletal: Normal range of motion. Extremities are nontender. No cyanosis, edema or clubbing noted Lymphadenopathy: No cervical, preauricular, postauricular or axillary adenopathy is present Skin: Skin is warm and dry. No rash noted. No diaphoresis. No erythema. No pallor. Pscyh: Normal mood and affect. Behavior is normal. Judgment and thought content normal.   LABORATORY RESULTS: No results found for this or any previous visit (from the past 48 hour(s)).   RADIOLOGY RESULTS: No results found.  Problem List: Patient Active Problem List   Diagnosis Date Noted  . Abdominal pain 02/28/2015  . AP (abdominal pain)   . Sinus problem 10/01/2014  . Periumbilical abdominal pain 08/08/2014  . Nausea with vomiting 08/08/2014  . Weight loss 08/08/2014  . Abdominal pain, epigastric 08/08/2014  . Abdominal pain, periumbilical [id number]  . GERD (gastroesophageal reflux disease) 08/03/2014  . Acid reflux 08/03/2014  . Erectile dysfunction 07/02/2014  . ED (erectile dysfunction) of organic origin 07/02/2014  . Type 2 diabetes mellitus ([place]) 06/06/2014  . Counseling regarding end of life decision making 01/23/2014  . Other specified counseling 01/23/2014  . Essential and other specified forms of tremor 08/30/2013  . Dysphagia,  pharyngoesophageal phase 08/30/2013  . Benign essential tremor 08/30/2013  . COPD, mild ([place]) 07/13/2013  . Chronic obstructive pulmonary disease ([place]) 07/13/2013  . Post-op pain 05/13/2013  . Postoperative pain, acute, shoulder 05/13/2013  . Arthralgia of shoulder 05/13/2013  . Pain following surgery or procedure 05/13/2013  . Cervical stenosis of spinal canal 05/09/2013  . Cervical spinal stenosis 05/09/2013  . Encounter for therapeutic drug monitoring 04/26/2013  . Panniculitis 03/09/2013  . Shoulder arthritis 01/26/2013  . Disorder of shoulder 01/26/2013  . Peripheral neuropathy ([place]) 12/11/2011  . Hereditary and idiopathic neuropathy 12/11/2011  . 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 History of surgical procedure 09/03/2011  . Atrial fibrillation, permanent ([place]) 08/12/2011  . Atrial fibrillation ([place]) 08/12/2011  . Bacterial overgrowth syndrome 10/28/2010  . Digestive disorder 10/28/2010  . Nonsurgical dumping syndrome 10/14/2010  . 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 Diverticulitis of colon 09/02/2010  . Acute venous embolism and thrombosis of deep vessels of proximal lower extremity ([place]) 08/01/2010  . Vascular disorder of lower extremity 08/01/2010  . OSTEOPOROSIS, DRUG-INDUCED 11/26/2009  . Regional osteoporosis 11/26/2009  . Anemia 09/02/2009  . RENAL INSUFFICIENCY 09/02/2009  . Disorder resulting from impaired renal function 09/02/2009  . ALLERGIC RHINITIS CAUSE UNSPECIFIED 01/17/2009  . Allergic rhinitis 01/17/2009  . ASTHMA, PERSISTENT, MILD 11/13/2008  . Airway hyperreactivity 11/13/2008  . FOOT SURGERY, HX OF 12/18/2006  . KNEE REPLACEMENT, LEFT, HX OF 12/18/2006  . Other specified personal risk factors, not elsewhere classified 12/18/2006  . History of knee surgery 12/18/2006  . HERPES ZOSTER W/NERVOUS COMPLICATION NEC [id number]  . Post viral syndrome 07/01/2006  . HYPERCHOLESTEROLEMIA 05/19/2006  . ANXIETY 05/19/2006   . DEPRESSION 05/19/2006  . RESTLESS LEG SYNDROME 05/19/2006  . HYPERTENSION 05/19/2006  . DIVERTICULOSIS, COLON 05/19/2006  . IRRITABLE BOWEL SYNDROME 05/19/2006  .  LOW BACK PAIN, CHRONIC 05/19/2006  . Anxiety state 05/19/2006  . Diverticular disease of large intestine 05/19/2006  . Essential (primary) hypertension 05/19/2006  . Adaptive colitis 05/19/2006  . LBP (low back pain) 05/19/2006  . Major depressive disorder with single episode ([place]) 05/19/2006  . Restless leg 05/19/2006    Assessment & Plan: Suspected internal hernia.  Plan laparoscopy possible laparotomy for reduction of hernia and repair of defect.    Matt B. Hassell Done, MD, [facility] [phone] beeper [phone]  03/07/2015 11:01 AM

## 2015-03-07 NOTE — Telephone Encounter (Signed)
F/u  Pt wife following up on upcoming surgery. Please call back and discuss.

## 2015-03-07 NOTE — Progress Notes (Signed)
03-07-15 1355 Note labs viewable in Epic-note CMP results.

## 2015-03-07 NOTE — Patient Instructions (Addendum)
[name]  03/07/2015   Your procedure is scheduled on: 03-08-15 Friday  Report to [facility]  Entrance take [facility]  elevators to 3rd floor to  [place] at  [place].  Call this number if you have problems the morning of surgery [phone]   Remember: ONLY 1 PERSON MAY GO WITH YOU TO SHORT STAY TO GET  READY MORNING OF [place].  Do not eat food or drink liquids :After Midnight.     Take these medicines the morning of surgery with A SIP OF WATER: Fluoxetine.Primidone.Rapaflo.Cipro. DO NOT TAKE ANY DIABETIC MEDICATIONS DAY OF YOUR SURGERY                               You may not have any metal on your body including hair pins and              piercings  Do not wear jewelry, make-up, lotions, powders or perfumes, deodorant             Do not wear nail polish.  Do not shave  48 hours prior to surgery.              Men may shave face and neck.   Do not bring valuables to the hospital. [place].  Contacts, dentures or bridgework may not be worn into surgery.  Leave suitcase in the car. After surgery it may be brought to your room.     Patients discharged the day of surgery will not be allowed to drive home.  Name and phone number of your driver: [name] -spouse [phone] cell  Special Instructions: N/A              Please read over the following fact sheets you were given: _____________________________________________________________________             [facility] - Preparing for Surgery Before surgery, you can play an important role.  Because skin is not sterile, your skin needs to be as free of germs as possible.  You can reduce the number of germs on your skin by washing with CHG (chlorahexidine gluconate) soap before surgery.  CHG is an antiseptic cleaner which kills germs and bonds with the skin to continue killing germs even after washing. Please DO NOT use if you have an  allergy to CHG or antibacterial soaps.  If your skin becomes reddened/irritated stop using the CHG and inform your nurse when you arrive at Short Stay. Do not shave (including legs and underarms) for at least 48 hours prior to the first CHG shower.  You may shave your face/neck. Please follow these instructions carefully:  1.  Shower with CHG Soap the night before surgery and the  morning of Surgery.  2.  If you choose to wash your hair, wash your hair first as usual with your  normal  shampoo.  3.  After you shampoo, rinse your hair and body thoroughly to remove the  shampoo.                           4.  Use CHG as you would any other liquid soap.  You can apply chg directly  to the skin and wash                       Gently with a scrungie or clean washcloth.  5.  Apply the CHG Soap to your body ONLY FROM THE NECK DOWN.   Do not use on face/ open                           Wound or open sores. Avoid contact with eyes, ears mouth and genitals (private parts).                       Wash face,  Genitals (private parts) with your normal soap.             6.  Wash thoroughly, paying special attention to the area where your surgery  will be performed.  7.  Thoroughly rinse your body with warm water from the neck down.  8.  DO NOT shower/wash with your normal soap after using and rinsing off  the CHG Soap.                9.  Pat yourself dry with a clean towel.            10.  Wear clean pajamas.            11.  Place clean sheets on your bed the night of your first shower and do not  sleep with pets. Day of Surgery : Do not apply any lotions/deodorants the morning of surgery.  Please wear clean clothes to the hospital/surgery center.  FAILURE TO FOLLOW THESE INSTRUCTIONS MAY RESULT IN THE CANCELLATION OF YOUR SURGERY PATIENT SIGNATURE_________________________________  NURSE SIGNATURE__________________________________  ________________________________________________________________________

## 2015-03-08 ENCOUNTER — Ambulatory Visit: Payer: Medicare Other | Admitting: Neurology

## 2015-03-08 ENCOUNTER — Ambulatory Visit (HOSPITAL_COMMUNITY): Payer: Medicare Other

## 2015-03-08 ENCOUNTER — Ambulatory Visit (HOSPITAL_COMMUNITY): Payer: Medicare Other | Admitting: Anesthesiology

## 2015-03-08 ENCOUNTER — Encounter (HOSPITAL_COMMUNITY)
Admission: RE | Disposition: A | Discharge status: Home / Self Care | Payer: Self-pay | Source: Ambulatory Visit | Attending: Surgery

## 2015-03-08 ENCOUNTER — Observation Stay (HOSPITAL_COMMUNITY)
Admission: RE | Admit: 2015-03-08 | Discharge: 2015-03-09 | Disposition: A | Discharge status: Home / Self Care | Payer: Medicare Other | Source: Ambulatory Visit | Attending: Surgery | Admitting: Surgery

## 2015-03-08 ENCOUNTER — Encounter (HOSPITAL_COMMUNITY): Payer: Self-pay | Admitting: Anesthesiology

## 2015-03-08 DIAGNOSIS — Z79899 Other long term (current) drug therapy: Secondary | ICD-10-CM | POA: Insufficient documentation

## 2015-03-08 DIAGNOSIS — Z7901 Long term (current) use of anticoagulants: Secondary | ICD-10-CM | POA: Insufficient documentation

## 2015-03-08 DIAGNOSIS — K567 Ileus, unspecified: Secondary | ICD-10-CM | POA: Diagnosis not present

## 2015-03-08 DIAGNOSIS — I482 Chronic atrial fibrillation: Secondary | ICD-10-CM | POA: Insufficient documentation

## 2015-03-08 DIAGNOSIS — D649 Anemia, unspecified: Secondary | ICD-10-CM | POA: Insufficient documentation

## 2015-03-08 DIAGNOSIS — Z9884 Bariatric surgery status: Secondary | ICD-10-CM | POA: Insufficient documentation

## 2015-03-08 DIAGNOSIS — Z79891 Long term (current) use of opiate analgesic: Secondary | ICD-10-CM | POA: Insufficient documentation

## 2015-03-08 DIAGNOSIS — M109 Gout, unspecified: Secondary | ICD-10-CM | POA: Insufficient documentation

## 2015-03-08 DIAGNOSIS — J4530 Mild persistent asthma, uncomplicated: Secondary | ICD-10-CM | POA: Insufficient documentation

## 2015-03-08 DIAGNOSIS — I129 Hypertensive chronic kidney disease with stage 1 through stage 4 chronic kidney disease, or unspecified chronic kidney disease: Secondary | ICD-10-CM | POA: Diagnosis not present

## 2015-03-08 DIAGNOSIS — G2581 Restless legs syndrome: Secondary | ICD-10-CM | POA: Diagnosis not present

## 2015-03-08 DIAGNOSIS — K660 Peritoneal adhesions (postprocedural) (postinfection): Secondary | ICD-10-CM | POA: Diagnosis not present

## 2015-03-08 DIAGNOSIS — R600 Localized edema: Secondary | ICD-10-CM | POA: Insufficient documentation

## 2015-03-08 DIAGNOSIS — K469 Unspecified abdominal hernia without obstruction or gangrene: Principal | ICD-10-CM | POA: Insufficient documentation

## 2015-03-08 DIAGNOSIS — E7800 Pure hypercholesterolemia, unspecified: Secondary | ICD-10-CM | POA: Insufficient documentation

## 2015-03-08 DIAGNOSIS — N189 Chronic kidney disease, unspecified: Secondary | ICD-10-CM | POA: Diagnosis not present

## 2015-03-08 DIAGNOSIS — J449 Chronic obstructive pulmonary disease, unspecified: Secondary | ICD-10-CM | POA: Insufficient documentation

## 2015-03-08 DIAGNOSIS — Z9889 Other specified postprocedural states: Secondary | ICD-10-CM | POA: Diagnosis not present

## 2015-03-08 DIAGNOSIS — H9193 Unspecified hearing loss, bilateral: Secondary | ICD-10-CM | POA: Insufficient documentation

## 2015-03-08 DIAGNOSIS — Z86711 Personal history of pulmonary embolism: Secondary | ICD-10-CM | POA: Insufficient documentation

## 2015-03-08 DIAGNOSIS — Z96612 Presence of left artificial shoulder joint: Secondary | ICD-10-CM | POA: Insufficient documentation

## 2015-03-08 DIAGNOSIS — K56609 Unspecified intestinal obstruction, unspecified as to partial versus complete obstruction: Secondary | ICD-10-CM

## 2015-03-08 DIAGNOSIS — F329 Major depressive disorder, single episode, unspecified: Secondary | ICD-10-CM | POA: Insufficient documentation

## 2015-03-08 DIAGNOSIS — M19041 Primary osteoarthritis, right hand: Secondary | ICD-10-CM | POA: Insufficient documentation

## 2015-03-08 DIAGNOSIS — M4802 Spinal stenosis, cervical region: Secondary | ICD-10-CM | POA: Insufficient documentation

## 2015-03-08 DIAGNOSIS — K219 Gastro-esophageal reflux disease without esophagitis: Secondary | ICD-10-CM | POA: Insufficient documentation

## 2015-03-08 DIAGNOSIS — Z7984 Long term (current) use of oral hypoglycemic drugs: Secondary | ICD-10-CM | POA: Insufficient documentation

## 2015-03-08 DIAGNOSIS — E1140 Type 2 diabetes mellitus with diabetic neuropathy, unspecified: Secondary | ICD-10-CM | POA: Insufficient documentation

## 2015-03-08 DIAGNOSIS — K458 Other specified abdominal hernia without obstruction or gangrene: Secondary | ICD-10-CM | POA: Diagnosis not present

## 2015-03-08 DIAGNOSIS — E1151 Type 2 diabetes mellitus with diabetic peripheral angiopathy without gangrene: Secondary | ICD-10-CM | POA: Insufficient documentation

## 2015-03-08 DIAGNOSIS — M19042 Primary osteoarthritis, left hand: Secondary | ICD-10-CM | POA: Insufficient documentation

## 2015-03-08 DIAGNOSIS — I10 Essential (primary) hypertension: Secondary | ICD-10-CM | POA: Insufficient documentation

## 2015-03-08 HISTORY — PX: LAPAROSCOPIC INTERNAL HERNIA REPAIR: SHX6502

## 2015-03-08 LAB — CBC
HEMATOCRIT: 36.5 % — AB (ref 39.0–52.0)
HEMOGLOBIN: 12.4 g/dL — AB (ref 13.0–17.0)
MCH: 33.2 pg (ref 26.0–34.0)
MCHC: 34 g/dL (ref 30.0–36.0)
MCV: 97.6 fL (ref 78.0–100.0)
Platelets: 214 10*3/uL (ref 150–400)
RBC: 3.74 MIL/uL — AB (ref 4.22–5.81)
RDW: 13.7 % (ref 11.5–15.5)
WBC: 10.6 10*3/uL — ABNORMAL HIGH (ref 4.0–10.5)

## 2015-03-08 LAB — CREATININE, SERUM
CREATININE: 1.82 mg/dL — AB (ref 0.61–1.24)
GFR calc Af Amer: 41 mL/min — ABNORMAL LOW (ref >60)
GFR calc non Af Amer: 35 mL/min — ABNORMAL LOW (ref >60)

## 2015-03-08 LAB — GLUCOSE, CAPILLARY: GLUCOSE-CAPILLARY: 121 mg/dL — AB (ref 65–99)

## 2015-03-08 LAB — HEMOGLOBIN A1C
Hgb A1c MFr Bld: 6.6 % — ABNORMAL HIGH (ref 4.8–5.6)
MEAN PLASMA GLUCOSE: 143 mg/dL

## 2015-03-08 SURGERY — LAPAROSCOPIC INTERNAL HERNIA REPAIR
Anesthesia: General

## 2015-03-08 MED ORDER — ROCURONIUM BROMIDE 100 MG/10ML IV SOLN
INTRAVENOUS | Status: AC
  Filled 2015-03-08: qty 1

## 2015-03-08 MED ORDER — HEPARIN SODIUM (PORCINE) 5000 UNIT/ML IJ SOLN
5000.0000 [IU] | INTRAMUSCULAR | Status: AC
  Administered 2015-03-08: 5000 [IU] via SUBCUTANEOUS
  Filled 2015-03-08: qty 1

## 2015-03-08 MED ORDER — ONDANSETRON 4 MG PO TBDP: 4.0000 mg | ORAL_TABLET | Freq: Four times a day (QID) | ORAL | Status: DC | PRN

## 2015-03-08 MED ORDER — ROCURONIUM BROMIDE 100 MG/10ML IV SOLN
INTRAVENOUS | Status: DC | PRN
  Administered 2015-03-08: 40 mg via INTRAVENOUS
  Administered 2015-03-08 (×3): 10 mg via INTRAVENOUS
  Administered 2015-03-08: 5 mg via INTRAVENOUS
  Administered 2015-03-08: 10 mg via INTRAVENOUS

## 2015-03-08 MED ORDER — SUGAMMADEX SODIUM 200 MG/2ML IV SOLN
INTRAVENOUS | Status: DC | PRN
  Administered 2015-03-08: 170 mg via INTRAVENOUS

## 2015-03-08 MED ORDER — SODIUM CHLORIDE 0.9 % IJ SOLN
INTRAMUSCULAR | Status: AC
  Filled 2015-03-08: qty 50

## 2015-03-08 MED ORDER — ZOLPIDEM TARTRATE 5 MG PO TABS
5.0000 mg | ORAL_TABLET | Freq: Every evening | ORAL | Status: DC | PRN
  Administered 2015-03-08: 5 mg via ORAL
  Filled 2015-03-08: qty 1

## 2015-03-08 MED ORDER — ACETAMINOPHEN 10 MG/ML IV SOLN
INTRAVENOUS | Status: AC
  Filled 2015-03-08: qty 100

## 2015-03-08 MED ORDER — PANTOPRAZOLE SODIUM 40 MG IV SOLR
40.0000 mg | Freq: Every day | INTRAVENOUS | Status: DC
  Administered 2015-03-08: 40 mg via INTRAVENOUS
  Filled 2015-03-08 (×2): qty 40

## 2015-03-08 MED ORDER — SODIUM CHLORIDE 0.9 % IJ SOLN
INTRAMUSCULAR | Status: AC
  Filled 2015-03-08: qty 10

## 2015-03-08 MED ORDER — MORPHINE SULFATE (PF) 2 MG/ML IV SOLN
1.0000 mg | INTRAVENOUS | Status: DC | PRN
Start: 2015-03-08 — End: 2015-03-09
  Administered 2015-03-08: 1 mg via INTRAVENOUS
  Filled 2015-03-08: qty 1

## 2015-03-08 MED ORDER — HYDROMORPHONE HCL 1 MG/ML IJ SOLN
INTRAMUSCULAR | Status: AC
  Filled 2015-03-08: qty 1

## 2015-03-08 MED ORDER — PROPOFOL 10 MG/ML IV BOLUS
INTRAVENOUS | Status: AC
  Filled 2015-03-08: qty 20

## 2015-03-08 MED ORDER — BUPIVACAINE LIPOSOME 1.3 % IJ SUSP
20.0000 mL | Freq: Once | INTRAMUSCULAR | Status: DC
  Filled 2015-03-08: qty 20

## 2015-03-08 MED ORDER — LACTATED RINGERS IV SOLN: INTRAVENOUS | Status: DC

## 2015-03-08 MED ORDER — HEPARIN SODIUM (PORCINE) 5000 UNIT/ML IJ SOLN
5000.0000 [IU] | Freq: Three times a day (TID) | INTRAMUSCULAR | Status: DC
  Administered 2015-03-08 – 2015-03-09 (×2): 5000 [IU] via SUBCUTANEOUS
  Filled 2015-03-08 (×5): qty 1

## 2015-03-08 MED ORDER — BUPIVACAINE LIPOSOME 1.3 % IJ SUSP
INTRAMUSCULAR | Status: DC | PRN
  Administered 2015-03-08: 20 mL

## 2015-03-08 MED ORDER — ONDANSETRON HCL 4 MG/2ML IJ SOLN: 4.0000 mg | Freq: Four times a day (QID) | INTRAMUSCULAR | Status: DC | PRN

## 2015-03-08 MED ORDER — ONDANSETRON HCL 4 MG/2ML IJ SOLN
INTRAMUSCULAR | Status: DC | PRN
  Administered 2015-03-08: 4 mg via INTRAVENOUS

## 2015-03-08 MED ORDER — EPHEDRINE SULFATE 50 MG/ML IJ SOLN
INTRAMUSCULAR | Status: AC
  Filled 2015-03-08: qty 1

## 2015-03-08 MED ORDER — HYDROMORPHONE HCL 1 MG/ML IJ SOLN
0.2500 mg | INTRAMUSCULAR | Status: DC | PRN
  Administered 2015-03-08 (×4): 0.5 mg via INTRAVENOUS

## 2015-03-08 MED ORDER — PHENYLEPHRINE HCL 10 MG/ML IJ SOLN
INTRAMUSCULAR | Status: DC | PRN
  Administered 2015-03-08: 120 ug via INTRAVENOUS
  Administered 2015-03-08: 80 ug via INTRAVENOUS

## 2015-03-08 MED ORDER — DEXAMETHASONE SODIUM PHOSPHATE 10 MG/ML IJ SOLN
INTRAMUSCULAR | Status: AC
  Filled 2015-03-08: qty 2

## 2015-03-08 MED ORDER — KCL IN DEXTROSE-NACL 10-5-0.45 MEQ/L-%-% IV SOLN
INTRAVENOUS | Status: DC
  Administered 2015-03-08 (×2): via INTRAVENOUS
  Administered 2015-03-09: 100 mL/h via INTRAVENOUS
  Filled 2015-03-08 (×3): qty 1000

## 2015-03-08 MED ORDER — PROPOFOL 10 MG/ML IV BOLUS
INTRAVENOUS | Status: DC | PRN
  Administered 2015-03-08: 150 mg via INTRAVENOUS

## 2015-03-08 MED ORDER — DEXTROSE 5 % IV SOLN
INTRAVENOUS | Status: AC
  Filled 2015-03-08 (×2): qty 2

## 2015-03-08 MED ORDER — HYDROCODONE-ACETAMINOPHEN 5-325 MG PO TABS
1.0000 | ORAL_TABLET | ORAL | Status: DC | PRN
  Administered 2015-03-08 – 2015-03-09 (×5): 2 via ORAL
  Filled 2015-03-08 (×5): qty 2

## 2015-03-08 MED ORDER — FENTANYL CITRATE (PF) 250 MCG/5ML IJ SOLN
INTRAMUSCULAR | Status: AC
  Filled 2015-03-08: qty 5

## 2015-03-08 MED ORDER — DEXTROSE 5 % IV SOLN
2.0000 g | Freq: Two times a day (BID) | INTRAVENOUS | Status: AC
  Administered 2015-03-08: 2 g via INTRAVENOUS
  Filled 2015-03-08: qty 2

## 2015-03-08 MED ORDER — HYDROMORPHONE HCL 2 MG/ML IJ SOLN
INTRAMUSCULAR | Status: AC
  Filled 2015-03-08: qty 1

## 2015-03-08 MED ORDER — HYDROMORPHONE HCL 1 MG/ML IJ SOLN
INTRAMUSCULAR | Status: DC | PRN
  Administered 2015-03-08 (×5): .4 mg via INTRAVENOUS

## 2015-03-08 MED ORDER — LIDOCAINE HCL (CARDIAC) 20 MG/ML IV SOLN
INTRAVENOUS | Status: DC | PRN
  Administered 2015-03-08: 75 mg via INTRAVENOUS

## 2015-03-08 MED ORDER — SUGAMMADEX SODIUM 200 MG/2ML IV SOLN
INTRAVENOUS | Status: AC
  Filled 2015-03-08: qty 2

## 2015-03-08 MED ORDER — LISINOPRIL 2.5 MG PO TABS
2.5000 mg | ORAL_TABLET | Freq: Every morning | ORAL | Status: DC
  Administered 2015-03-08: 2.5 mg via ORAL
  Filled 2015-03-08 (×2): qty 1

## 2015-03-08 MED ORDER — ONDANSETRON HCL 4 MG/2ML IJ SOLN
INTRAMUSCULAR | Status: AC
  Filled 2015-03-08: qty 2

## 2015-03-08 MED ORDER — ACETAMINOPHEN 10 MG/ML IV SOLN
INTRAVENOUS | Status: DC | PRN
  Administered 2015-03-08: 1000 mg via INTRAVENOUS

## 2015-03-08 MED ORDER — SODIUM CHLORIDE 0.9 % IV SOLN
10.0000 mg | INTRAVENOUS | Status: DC | PRN
  Administered 2015-03-08: 50 ug/min via INTRAVENOUS

## 2015-03-08 MED ORDER — LIDOCAINE HCL (CARDIAC) 20 MG/ML IV SOLN
INTRAVENOUS | Status: AC
  Filled 2015-03-08: qty 5

## 2015-03-08 MED ORDER — DEXTROSE 5 % IV SOLN
2.0000 g | INTRAVENOUS | Status: AC
  Administered 2015-03-08 (×2): 2 g via INTRAVENOUS

## 2015-03-08 MED ORDER — SUCCINYLCHOLINE CHLORIDE 20 MG/ML IJ SOLN
INTRAMUSCULAR | Status: DC | PRN
  Administered 2015-03-08: 100 mg via INTRAVENOUS

## 2015-03-08 MED ORDER — BUPIVACAINE-EPINEPHRINE (PF) 0.25% -1:200000 IJ SOLN
INTRAMUSCULAR | Status: AC
  Filled 2015-03-08: qty 30

## 2015-03-08 MED ORDER — FENTANYL CITRATE (PF) 100 MCG/2ML IJ SOLN
INTRAMUSCULAR | Status: DC | PRN
  Administered 2015-03-08 (×2): 50 ug via INTRAVENOUS
  Administered 2015-03-08: 150 ug via INTRAVENOUS

## 2015-03-08 MED ORDER — TAMSULOSIN HCL 0.4 MG PO CAPS
0.4000 mg | ORAL_CAPSULE | Freq: Every day | ORAL | Status: DC
Start: 2015-03-08 — End: 2015-03-09
  Administered 2015-03-08: 0.4 mg via ORAL
  Filled 2015-03-08 (×2): qty 1

## 2015-03-08 MED ORDER — HYDRALAZINE HCL 20 MG/ML IJ SOLN: 10.0000 mg | INTRAMUSCULAR | Status: DC | PRN

## 2015-03-08 MED ORDER — PHENYLEPHRINE 40 MCG/ML (10ML) SYRINGE FOR IV PUSH (FOR BLOOD PRESSURE SUPPORT)
PREFILLED_SYRINGE | INTRAVENOUS | Status: AC
  Filled 2015-03-08: qty 10

## 2015-03-08 MED ORDER — DEXAMETHASONE SODIUM PHOSPHATE 10 MG/ML IJ SOLN
INTRAMUSCULAR | Status: DC | PRN
  Administered 2015-03-08: 10 mg via INTRAVENOUS

## 2015-03-08 MED ORDER — LACTATED RINGERS IV SOLN
INTRAVENOUS | Status: DC | PRN
  Administered 2015-03-08 (×2): via INTRAVENOUS

## 2015-03-08 SURGICAL SUPPLY — 38 items
CLIP SUT LAPRA TY ABSORB (SUTURE) ×2 IMPLANT
COVER SURGICAL LIGHT HANDLE (MISCELLANEOUS) ×3 IMPLANT
DECANTER SPIKE VIAL GLASS SM (MISCELLANEOUS) ×4 IMPLANT
DEVICE SUTURE ENDOST 10MM (ENDOMECHANICALS) ×2 IMPLANT
DISSECTOR BLUNT TIP ENDO 5MM (MISCELLANEOUS) IMPLANT
ELECT REM PT RETURN 9FT ADLT (ELECTROSURGICAL) ×3
ELECTRODE REM PT RTRN 9FT ADLT (ELECTROSURGICAL) ×1 IMPLANT
GAUZE SPONGE 4X4 12PLY STRL (GAUZE/BANDAGES/DRESSINGS) ×3 IMPLANT
GLOVE BIOGEL M 8.0 STRL (GLOVE) ×3 IMPLANT
GLOVE BIOGEL PI IND STRL 7.0 (GLOVE) IMPLANT
GLOVE BIOGEL PI INDICATOR 7.0 (GLOVE)
GOWN SPEC L4 XLG W/TWL (GOWN DISPOSABLE) ×3 IMPLANT
GOWN STRL REUS W/TWL LRG LVL3 (GOWN DISPOSABLE) ×9 IMPLANT
GOWN STRL REUS W/TWL XL LVL3 (GOWN DISPOSABLE) ×9 IMPLANT
KIT BASIN OR (CUSTOM PROCEDURE TRAY) ×3 IMPLANT
LIQUID BAND (GAUZE/BANDAGES/DRESSINGS) ×2 IMPLANT
NDL SPNL 22GX3.5 QUINCKE BK (NEEDLE) IMPLANT
NEEDLE SPNL 22GX3.5 QUINCKE BK (NEEDLE) IMPLANT
NS IRRIG 1000ML POUR BTL (IV SOLUTION) ×3 IMPLANT
RELOAD ENDO STITCH 2.0 (ENDOMECHANICALS) ×3
RELOAD SUT SNGL STCH BLK 2-0 (ENDOMECHANICALS) IMPLANT
SET IRRIG TUBING LAPAROSCOPIC (IRRIGATION / IRRIGATOR) IMPLANT
SHEARS HARMONIC ACE PLUS 36CM (ENDOMECHANICALS) IMPLANT
SHEARS HARMONIC ACE PLUS 45CM (MISCELLANEOUS) IMPLANT
SLEEVE XCEL OPT CAN 5 100 (ENDOMECHANICALS) ×4 IMPLANT
SOLUTION ANTI FOG 6CC (MISCELLANEOUS) ×3 IMPLANT
STAPLER VISISTAT 35W (STAPLE) ×3 IMPLANT
SUT RELOAD ENDO STITCH 2.0 (ENDOMECHANICALS) ×1
SUT SILK 2 0 SH (SUTURE) IMPLANT
SUT VIC AB 4-0 SH 18 (SUTURE) ×3 IMPLANT
SUTURE RELOAD ENDO STITCH 2.0 (ENDOMECHANICALS) ×1 IMPLANT
TOWEL OR 17X26 10 PK STRL BLUE (TOWEL DISPOSABLE) ×6 IMPLANT
TOWEL OR NON WOVEN STRL DISP B (DISPOSABLE) ×3 IMPLANT
TRAY LAPAROSCOPIC (CUSTOM PROCEDURE TRAY) ×3 IMPLANT
TROCAR ADV FIXATION 5X100MM (TROCAR) ×2 IMPLANT
TROCAR BLADELESS OPT 5 100 (ENDOMECHANICALS) ×3 IMPLANT
TROCAR XCEL NON-BLD 11X100MML (ENDOMECHANICALS) ×2 IMPLANT
TUBING INSUFFLATION 10FT LAP (TUBING) ×3 IMPLANT

## 2015-03-08 NOTE — H&P (View-Only) (Signed)
Chief Complaint:  Abdominal pain post bypass  History of Present Illness:  [name] is an 75 y.o. male who was seen in the office on 1/26 after being seen in the [facility] ER 1/19 for abdominal pain radiating into the back .  It came on fairly suddenly.  CT scan which was performed there which I  Reviewed suggest a probable internal hernia.  I have explained this to him and his wife and have set this up for tomorrow morning at 7:30.  We'll plany and reduction of internal hernia.  He is aware might have to be done open. He has held his Coumadin for the past 5 days.  Past Medical History  Diagnosis Date  . Diverticulosis of colon (without mention of hemorrhage)   . Restless legs syndrome (RLS)   . Anxiety state, unspecified   . Hx of gout     but doesn't take any meds  . Bruises easily     pt is on Coumadin  . Cancer ([place])     SKIN CANCER REMOVED  . Depressive disorder, not elsewhere classified     takes Prozac daily  . History of blood clots     behind right knee and then went into left lung 89yrs ago  . Peripheral edema     takes Furosemide daily  . Unspecified asthma(493.90)     as a child  . Pneumonia     last time about 42yrs ago  . Joint pain   . History of colon polyps   . History of kidney stones   . Hard of hearing     wears hearing aids  . Chronic atrial fibrillation (HCC)     a. on warfarin  . Peripheral vascular disease ([place])   . Pulmonary embolism ([place])     over 10 yrs ago "many years ago"  . ED (erectile dysfunction)   . Hx of diabetes mellitus     "no longer diabetic since gastric bypass" -  . Unspecified essential hypertension     hx of no longer on medication due to gastric bypass   . GERD (gastroesophageal reflux disease)   . Arthritis     hands     Past Surgical History  Procedure Laterality Date  . Knee surgery Right     x 4  . Artery repair      Left forearm  . Foot surgery      Left foot   . Replacement total knee Left     x 2  . Breath  tek h pylori  01/08/2011    Procedure: BREATH TEK H PYLORI;  Surgeon: Pedro Earls, MD;  Location: [facility];  Service: General;  Laterality: N/A;  . Hand surgery      LEFT  . Leg surgery      FOR NECROTIZING FASCITIS L LEG AND GROIN  . 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 Shoulder arthroscopy    . Shoulder surgery Left 2014  . Back surgery      x 3  . Knee surgery Left     arthroscopy  . Eye surgery      cataract bil  . Injections in back      x 18  . Colonoscopy    . Cystoscopy    . Total shoulder arthroplasty Left 01/26/2013  Procedure: TOTAL SHOULDER ARTHROPLASTY;  Surgeon: Nita Sells, MD;  Location: [facility];  Service: Orthopedics;  Laterality: Left;  Left total shoulder arthroplasty  . Anterior cervical decomp/discectomy fusion N/A 05/09/2013    Procedure: ANTERIOR CERVICAL DECOMPRESSION/DISCECTOMY FUSION CERVICAL THREE-FOUR,CERVICAL FOUR-FIVE,CERVICAL FIVE-SIX;  Surgeon: Floyce Stakes, MD;  Location: [facility];  Service: Neurosurgery;  Laterality: N/A;  . Penile prosthesis implant N/A 07/02/2014    Procedure: PENILE PROTHESIS INFLATABLE 3 PIECE (COLOPLAST) SCROTAL APPROACH;  Surgeon: Kathie Rhodes, MD;  Location: [facility];  Service: Urology;  Laterality: N/A;  . Esophagogastroduodenoscopy (egd) with propofol N/A 09/21/2014    Procedure: ESOPHAGOGASTRODUODENOSCOPY (EGD) WITH PROPOFOL;  Surgeon: Manya Silvas, MD;  Location: [facility];  Service: Endoscopy;  Laterality: N/A;  . Penile prosthesis implant N/A 11/23/2014    Procedure: EXPLORATION AND REVISION OF PENILE PROSTHESIS;  Surgeon: Kathie Rhodes, MD;  Location: [facility];  Service: Urology;  Laterality: N/A;    Current Outpatient Prescriptions  Medication Sig Dispense Refill  . atorvastatin (LIPITOR) 80 MG tablet Take 1 tablet by mouth at  bedtime  90 tablet 0  . ciprofloxacin (CIPRO) 500 MG tablet Take 1 tablet (500 mg total) by mouth 2 (two) times daily. 14 tablet 0  . FLUoxetine (PROZAC) 40 MG capsule Take 1 capsule by mouth  daily 90 capsule 0  . furosemide (LASIX) 20 MG tablet Take 1 to 2 tablets by  mouth daily 180 tablet 1  . gabapentin (NEURONTIN) 300 MG capsule Take 300 mg by mouth at bedtime.     [name] HYDROcodone-acetaminophen (NORCO/VICODIN) 5-325 MG tablet Take 1-2 tablets by mouth every 4 (four) hours as needed for moderate pain. 30 tablet 0  . lisinopril (PRINIVIL,ZESTRIL) 2.5 MG tablet Take 2.5 mg by mouth every morning.     . metFORMIN (GLUCOPHAGE) 500 MG tablet Take 500 mg by mouth 2 (two) times daily with a meal.    . primidone (MYSOLINE) 50 MG tablet TAKE 2 TABLETS BY MOUTH IN THE MORNING, 1 TABLET IN THE EVENING 270 tablet 1  . silodosin (RAPAFLO) 8 MG CAPS capsule Take 8 mg by mouth daily with breakfast.    . warfarin (COUMADIN) 2.5 MG tablet Take 1 to 1.5 tablets by mouth daily or as directed by coumadin clinic (Patient taking differently: Take 2.5 mg by mouth daily at 6 PM. ) 105 tablet 1  . zolpidem (AMBIEN) 10 MG tablet TAKE 1/2-1 TABLET BY MOUTH AT BEDTIME 30 tablet 0   No current facility-administered medications for this visit.   Sulfacetamide sodium and Adhesive Family History  Problem Relation Age of Onset  . Uterine cancer Mother     mets  . Breast cancer Mother   . Cancer Mother     cervical  . Emphysema Father   . Alzheimer's disease Sister   . Prostate cancer Brother   . Heart attack Brother   . Colon cancer Brother 22   Social History:   reports that he has never smoked. He has never used smokeless tobacco. He reports that he does not drink alcohol or use illicit drugs.   REVIEW OF SYSTEMS : Negative except for see long extensive problem list  Physical Exam:   There were no vitals taken for this visit. There is no weight on file to calculate BMI.  Gen:  WDWN white male NAD  Neurological:  Alert and oriented to person, place, and time. Motor and sensory function is grossly intact  Head: Normocephalic and atraumatic.  Eyes: Conjunctivae are normal.  Pupils are equal, round, and reactive to light. No scleral icterus.  Neck: Normal range of motion. Neck supple. No tracheal deviation or thyromegaly present.  Cardiovascular:  SR without murmurs or gallops.  No carotid bruits Breast:  Not examined Respiratory: Effort normal.  No respiratory distress. No chest wall tenderness. Breath sounds normal.  No wheezes, rales or rhonchi.  Abdomen:  Abdomen flat with panniculus mainly of skin.  No rebound or guarding.  No masses palpable. GU:  Prior urologic procedures Musculoskeletal: Normal range of motion. Extremities are nontender. No cyanosis, edema or clubbing noted Lymphadenopathy: No cervical, preauricular, postauricular or axillary adenopathy is present Skin: Skin is warm and dry. No rash noted. No diaphoresis. No erythema. No pallor. Pscyh: Normal mood and affect. Behavior is normal. Judgment and thought content normal.   LABORATORY RESULTS: No results found for this or any previous visit (from the past 48 hour(s)).   RADIOLOGY RESULTS: No results found.  Problem List: Patient Active Problem List   Diagnosis Date Noted  . Abdominal pain 02/28/2015  . AP (abdominal pain)   . Sinus problem 10/01/2014  . Periumbilical abdominal pain 08/08/2014  . Nausea with vomiting 08/08/2014  . Weight loss 08/08/2014  . Abdominal pain, epigastric 08/08/2014  . Abdominal pain, periumbilical [id number]  . GERD (gastroesophageal reflux disease) 08/03/2014  . Acid reflux 08/03/2014  . Erectile dysfunction 07/02/2014  . ED (erectile dysfunction) of organic origin 07/02/2014  . Type 2 diabetes mellitus ([place]) 06/06/2014  . Counseling regarding end of life decision making 01/23/2014  . Other specified counseling 01/23/2014  . Essential and other specified forms of tremor 08/30/2013  . Dysphagia,  pharyngoesophageal phase 08/30/2013  . Benign essential tremor 08/30/2013  . COPD, mild ([place]) 07/13/2013  . Chronic obstructive pulmonary disease ([place]) 07/13/2013  . Post-op pain 05/13/2013  . Postoperative pain, acute, shoulder 05/13/2013  . Arthralgia of shoulder 05/13/2013  . Pain following surgery or procedure 05/13/2013  . Cervical stenosis of spinal canal 05/09/2013  . Cervical spinal stenosis 05/09/2013  . Encounter for therapeutic drug monitoring 04/26/2013  . Panniculitis 03/09/2013  . Shoulder arthritis 01/26/2013  . Disorder of shoulder 01/26/2013  . Peripheral neuropathy ([place]) 12/11/2011  . Hereditary and idiopathic neuropathy 12/11/2011  . 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 History of surgical procedure 09/03/2011  . Atrial fibrillation, permanent ([place]) 08/12/2011  . Atrial fibrillation ([place]) 08/12/2011  . Bacterial overgrowth syndrome 10/28/2010  . Digestive disorder 10/28/2010  . Nonsurgical dumping syndrome 10/14/2010  . 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 Diverticulitis of colon 09/02/2010  . Acute venous embolism and thrombosis of deep vessels of proximal lower extremity ([place]) 08/01/2010  . Vascular disorder of lower extremity 08/01/2010  . OSTEOPOROSIS, DRUG-INDUCED 11/26/2009  . Regional osteoporosis 11/26/2009  . Anemia 09/02/2009  . RENAL INSUFFICIENCY 09/02/2009  . Disorder resulting from impaired renal function 09/02/2009  . ALLERGIC RHINITIS CAUSE UNSPECIFIED 01/17/2009  . Allergic rhinitis 01/17/2009  . ASTHMA, PERSISTENT, MILD 11/13/2008  . Airway hyperreactivity 11/13/2008  . FOOT SURGERY, HX OF 12/18/2006  . KNEE REPLACEMENT, LEFT, HX OF 12/18/2006  . Other specified personal risk factors, not elsewhere classified 12/18/2006  . History of knee surgery 12/18/2006  . HERPES ZOSTER W/NERVOUS COMPLICATION NEC [id number]  . Post viral syndrome 07/01/2006  . HYPERCHOLESTEROLEMIA 05/19/2006  . ANXIETY 05/19/2006   . DEPRESSION 05/19/2006  . RESTLESS LEG SYNDROME 05/19/2006  . HYPERTENSION 05/19/2006  . DIVERTICULOSIS, COLON 05/19/2006  . IRRITABLE BOWEL SYNDROME 05/19/2006  .  LOW BACK PAIN, CHRONIC 05/19/2006  . Anxiety state 05/19/2006  . Diverticular disease of large intestine 05/19/2006  . Essential (primary) hypertension 05/19/2006  . Adaptive colitis 05/19/2006  . LBP (low back pain) 05/19/2006  . Major depressive disorder with single episode ([place]) 05/19/2006  . Restless leg 05/19/2006    Assessment & Plan: Suspected internal hernia.  Plan laparoscopy possible laparotomy for reduction of hernia and repair of defect.    Matt B. Hassell Done, MD, [facility] [phone] beeper [phone]  03/07/2015 11:01 AM

## 2015-03-08 NOTE — Anesthesia Preprocedure Evaluation (Addendum)
Anesthesia Evaluation  Patient identified by MRN, date of birth, ID band Patient awake    Reviewed: Allergy & Precautions, H&P , NPO status , Patient's Chart, lab work & pertinent test results  History of Anesthesia Complications Negative for: history of anesthetic complications  Airway Mallampati: I  TM Distance: >3 FB Neck ROM: full    Dental  (+) Edentulous Lower, Edentulous Upper, Dental Advisory Given   Pulmonary COPD,    Pulmonary exam normal breath sounds clear to auscultation       Cardiovascular hypertension, Pt. on medications + Peripheral Vascular Disease  Normal cardiovascular exam+ dysrhythmias Atrial Fibrillation  Rhythm:Irregular Rate:Normal     Neuro/Psych PSYCHIATRIC DISORDERS Anxiety Depression    GI/Hepatic Neg liver ROS, GERD  ,  Endo/Other    Renal/GU Renal diseaseCRT 1.56     Musculoskeletal   Abdominal   Peds  Hematology  (+) anemia ,   Anesthesia Other Findings  Has taken coumadin for a-fib prophylaxis  Denies active cardiac condition or any heart failure symptoms  Reproductive/Obstetrics negative OB ROS                            Anesthesia Physical Anesthesia Plan  ASA: III  Anesthesia Plan: General   Post-op Pain Management:    Induction: Intravenous  Airway Management Planned: Oral ETT  Additional Equipment:   Intra-op Plan:   Post-operative Plan: Extubation in OR  Informed Consent:   Plan Discussed with: Surgeon  Anesthesia Plan Comments:         Anesthesia Quick Evaluation

## 2015-03-08 NOTE — Anesthesia Procedure Notes (Addendum)
Procedure Name: Intubation Date/Time: 03/08/2015 7:26 AM Performed by: Danley Danker L Patient Re-evaluated:Patient Re-evaluated prior to inductionOxygen Delivery Method: Circle system utilized Preoxygenation: Pre-oxygenation with 100% oxygen Intubation Type: IV induction Ventilation: Mask ventilation without difficulty and Oral airway inserted - appropriate to patient size Laryngoscope Size: Miller and 3 Grade View: Grade I Tube type: Oral Tube size: 8.0 mm Number of attempts: 1 Airway Equipment and Method: Stylet Placement Confirmation: ETT inserted through vocal cords under direct vision,  positive ETCO2 and breath sounds checked- equal and bilateral Secured at: 22 cm Tube secured with: paper tape used. Dental Injury: Teeth and Oropharynx as per pre-operative assessment

## 2015-03-08 NOTE — Interval H&P Note (Signed)
History and Physical Interval Note:  03/08/2015 7:08 AM  [name]  has presented today for surgery, with the diagnosis of INTERNAL HERNIA POST GASTRIC BANDING  The various methods of treatment have been discussed with the patient and family. After consideration of risks, benefits and other options for treatment, the patient has consented to  Procedure(s): LAPAROSCOPIC INTERNAL HERNIA REPAIR  (N/A) as a surgical intervention .  The patient's history has been reviewed, patient examined, no change in status, stable for surgery.  I have reviewed the patient's chart and labs.  Questions were answered to the patient's satisfaction.     Herbie Lehrmann B

## 2015-03-08 NOTE — Telephone Encounter (Signed)
Left message to call back  

## 2015-03-08 NOTE — Transfer of Care (Signed)
Immediate Anesthesia Transfer of Care Note  Patient: [name]  Procedure(s) Performed: Procedure(s): LAPAROSCOPIC INTERNAL HERNIA REPAIR  (N/A)  Patient Location: PACU  Anesthesia Type:General  Level of Consciousness: awake and oriented  Airway & Oxygen Therapy: Patient Spontanous Breathing and Patient connected to face mask oxygen  Post-op Assessment: Report given to RN and Post -op Vital signs reviewed and stable  Post vital signs: Reviewed and stable  Last Vitals:  Filed Vitals:   03/08/15 0531  BP: 100/76  Pulse: 93  Temp: 36.4 C  Resp: 16    Complications: No apparent anesthesia complications

## 2015-03-08 NOTE — Anesthesia Postprocedure Evaluation (Signed)
Anesthesia Post Note  Patient: [name]  Procedure(s) Performed: Procedure(s) (LRB): LAPAROSCOPIC INTERNAL HERNIA REPAIR  (N/A)  Patient location during evaluation: PACU Anesthesia Type: General Level of consciousness: awake and alert Pain management: pain level controlled Vital Signs Assessment: post-procedure vital signs reviewed and stable Respiratory status: spontaneous breathing, nonlabored ventilation, respiratory function stable and patient connected to nasal cannula oxygen Cardiovascular status: blood pressure returned to baseline and stable Postop Assessment: no signs of nausea or vomiting Anesthetic complications: no    Last Vitals:  Filed Vitals:   03/08/15 1115 03/08/15 1130  BP: 104/70 107/61  Pulse: 80 72  Temp: 36.3 C 37.1 C  Resp: 18 18    Last Pain:  Filed Vitals:   03/08/15 1136  PainSc: 4                  Khamya Topp L

## 2015-03-08 NOTE — Brief Op Note (Signed)
03/08/2015  10:01 AM  PATIENT:  [name]  75 y.o. male  PRE-OPERATIVE DIAGNOSIS:  INTERNAL HERNIA POST GASTRIC Bypass  POST-OPERATIVE DIAGNOSIS:  INTERNAL HERNIA POST GASTRIC Bypass with two internal defects and one adhesive band lysed  PROCEDURE:  Procedure(s): LAPAROSCOPIC INTERNAL HERNIA REPAIR  (N/A)  SURGEON:  Surgeon(s) and Role:    * [name], MD - Primary    * Alphonsa Overall, MD - Assisting  PHYSICIAN ASSISTANT:   ASSISTANTS: Misty Stanley, FACS and Servando Snare, RNFA   ANESTHESIA:   general  EBL:  Total I/O In: 1000 [I.V.:1000] Out: -   BLOOD ADMINISTERED:none  DRAINS: none   LOCAL MEDICATIONS USED:  BUPIVICAINE   SPECIMEN:  No Specimen  DISPOSITION OF SPECIMEN:  N/A  COUNTS:  YES  TOURNIQUET:  * No tourniquets in log *  DICTATION: .Other Dictation: Dictation Number [id number]  PLAN OF CARE: Admit for overnight observation  PATIENT DISPOSITION:  PACU - hemodynamically stable.   Delay start of Pharmacological VTE agent (>24hrs) due to surgical blood loss or risk of bleeding: no

## 2015-03-08 NOTE — Telephone Encounter (Signed)
Telephone note was printed and taken to MR this AM to be faxed as requested by Wynelle Cleveland.  She is calling back stating she has not received anything from our office.   I verified the fax number and assured her it will be refaxed.  She will call back if not received.

## 2015-03-09 DIAGNOSIS — K660 Peritoneal adhesions (postprocedural) (postinfection): Secondary | ICD-10-CM | POA: Diagnosis not present

## 2015-03-09 DIAGNOSIS — Z9884 Bariatric surgery status: Secondary | ICD-10-CM | POA: Diagnosis not present

## 2015-03-09 DIAGNOSIS — K469 Unspecified abdominal hernia without obstruction or gangrene: Secondary | ICD-10-CM | POA: Diagnosis not present

## 2015-03-09 DIAGNOSIS — G2581 Restless legs syndrome: Secondary | ICD-10-CM | POA: Diagnosis not present

## 2015-03-09 DIAGNOSIS — M109 Gout, unspecified: Secondary | ICD-10-CM | POA: Diagnosis not present

## 2015-03-09 DIAGNOSIS — Z7901 Long term (current) use of anticoagulants: Secondary | ICD-10-CM | POA: Diagnosis not present

## 2015-03-09 LAB — CBC
HCT: 34 % — ABNORMAL LOW (ref 39.0–52.0)
HEMOGLOBIN: 11.1 g/dL — AB (ref 13.0–17.0)
MCH: 32.8 pg (ref 26.0–34.0)
MCHC: 32.6 g/dL (ref 30.0–36.0)
MCV: 100.6 fL — ABNORMAL HIGH (ref 78.0–100.0)
PLATELETS: 207 10*3/uL (ref 150–400)
RBC: 3.38 MIL/uL — ABNORMAL LOW (ref 4.22–5.81)
RDW: 14 % (ref 11.5–15.5)
WBC: 8.1 10*3/uL (ref 4.0–10.5)

## 2015-03-09 LAB — BASIC METABOLIC PANEL
Anion gap: 7 (ref 5–15)
BUN: 17 mg/dL (ref 6–20)
CALCIUM: 8.5 mg/dL — AB (ref 8.9–10.3)
CHLORIDE: 104 mmol/L (ref 101–111)
CO2: 25 mmol/L (ref 22–32)
CREATININE: 1.44 mg/dL — AB (ref 0.61–1.24)
GFR, EST AFRICAN AMERICAN: 54 mL/min — AB (ref >60)
GFR, EST NON AFRICAN AMERICAN: 47 mL/min — AB (ref >60)
Glucose, Bld: 181 mg/dL — ABNORMAL HIGH (ref 65–99)
Potassium: 4.3 mmol/L (ref 3.5–5.1)
SODIUM: 136 mmol/L (ref 135–145)

## 2015-03-09 MED ORDER — HYDROCODONE-ACETAMINOPHEN 5-325 MG PO TABS: 1.0000 | ORAL_TABLET | ORAL | Status: DC | PRN

## 2015-03-09 NOTE — Progress Notes (Signed)
Patient ID: [name], male   DOB: Dec 25, 1940, 75 y.o.   MRN: [id number] 1 Day Post-Op  Subjective: Expected mild discomfort. Tolerating liquids. Ambulatory. Had a bowel movement. Wants to go home.  Objective: Vital signs in last 24 hours: Temp:  [97.4 F (36.3 C)-98.7 F (37.1 C)] 97.8 F (36.6 C) (01/28 0500) Pulse Rate:  [49-92] 49 (01/28 0500) Resp:  [12-18] 16 (01/28 0500) BP: (88-127)/(51-84) 88/55 mmHg (01/28 0500) SpO2:  [99 %-100 %] 100 % (01/28 0500) Weight:  [89 kg (196 lb 3.4 oz)] 89 kg (196 lb 3.4 oz) (01/28 0500) Last BM Date: 03/08/15  Intake/Output from previous day: 01/27 0701 - 01/28 0700 In: 3153.3 [I.V.:3103.3; IV Piggyback:50] Out: 1200 [Urine:1175; Blood:25] Intake/Output this shift:    General appearance: alert, cooperative and no distress GI: normal findings: soft, non-tender Incision/Wound: clean and dry  Lab Results:   Recent Labs  03/08/15 1207 03/09/15 0500  WBC 10.6* 8.1  HGB 12.4* 11.1*  HCT 36.5* 34.0*  PLT 214 207   BMET  Recent Labs  03/07/15 1245 03/08/15 1207 03/09/15 0500  NA 138  --  136  K 4.2  --  4.3  CL 106  --  104  CO2 25  --  25  GLUCOSE 121*  --  181*  BUN 14  --  17  CREATININE 1.56* 1.82* 1.44*  CALCIUM 8.6*  --  8.5*     Studies/Results: Dg Abd Portable 1v  03/08/2015  CLINICAL DATA:  Postop hernia repair EXAM: PORTABLE ABDOMEN - 1 VIEW COMPARISON:  03/01/2015 FINDINGS: There is gaseous distention of small and large bowel. Free intraperitoneal gas is noted characterized by a gas on both sides of small bowel loops. Moderate stool burden in the distal colon. Stabilization hardware in the lower lumbar spine is stable. Fusion hardware in the SI joints is stable. Bone cement in L1 is stable. IMPRESSION: Moderate ileus pattern. There is free intraperitoneal gas consistent with recent abdominal surgery. Electronically Signed   By: Marybelle Killings M.D.   On: 03/08/2015 10:51    Anti-infectives: Anti-infectives     Start     Dose/Rate Route Frequency Ordered Stop   03/08/15 1800  cefoTEtan (CEFOTAN) 2 g in dextrose 5 % 50 mL IVPB     2 g 100 mL/hr over 30 Minutes Intravenous Every 12 hours 03/08/15 1139 03/08/15 1804   03/08/15 0550  cefOXitin (MEFOXIN) 2 g in dextrose 5 % 50 mL IVPB     2 g 100 mL/hr over 30 Minutes Intravenous On call to O.R. 03/08/15 0550 03/08/15 1014      Assessment/Plan: s/p Procedure(s): LAPAROSCOPIC INTERNAL HERNIA REPAIR  Doing well postoperatively without apparent complication. Okay for discharge.      Zalayah Pizzuto T 03/09/2015

## 2015-03-09 NOTE — Progress Notes (Signed)
Pt and spouse given discharge instructions and paper Rx given. Pt IV d/c,  wheeled to front entrance, no questions.

## 2015-03-09 NOTE — Discharge Instructions (Signed)
[facility] ______[facility] °LAPAROSCOPIC SURGERY: POST OP INSTRUCTIONS °Always review your discharge instruction sheet given to you by the facility where your surgery was performed. °IF YOU HAVE DISABILITY OR FAMILY LEAVE FORMS, YOU MUST BRING THEM TO THE OFFICE FOR PROCESSING.   °DO NOT GIVE THEM TO YOUR DOCTOR. ° °1. A prescription for pain medication may be given to you upon discharge.  Take your pain medication as prescribed, if needed.  If narcotic pain medicine is not needed, then you may take acetaminophen (Tylenol) or ibuprofen (Advil) as needed. °2. Take your usually prescribed medications unless otherwise directed. °3. If you need a refill on your pain medication, please contact your pharmacy.  They will contact our office to request authorization. Prescriptions will not be filled after 5pm or on week-ends. °4. You should follow a light diet the first few days after arrival home, such as soup and crackers, etc.  Be sure to include lots of fluids daily. °5. Most patients will experience some swelling and bruising in the area of the incisions.  Ice packs will help.  Swelling and bruising can take several days to resolve.  °6. It is common to experience some constipation if taking pain medication after surgery.  Increasing fluid intake and taking a stool softener (such as Colace) will usually help or prevent this problem from occurring.  A mild laxative (Milk of Magnesia or Miralax) should be taken according to package instructions if there are no bowel movements after 48 hours. °7. Unless discharge instructions indicate otherwise, you may remove your bandages 24-48 hours after surgery, and you may shower at that time.  You may have steri-strips (small skin tapes) in place directly over the incision.  These strips should be left on the skin for 7-10 days.  If your surgeon used skin glue on the incision, you may shower in 24 hours.  The glue will flake off over the next 2-3 weeks.  Any sutures or  staples will be removed at the office during your follow-up visit. °8. ACTIVITIES:  You may resume regular (light) daily activities beginning the next day--such as daily self-care, walking, climbing stairs--gradually increasing activities as tolerated.  You may have sexual intercourse when it is comfortable.  Refrain from any heavy lifting or straining until approved by your doctor. °a. You may drive when you are no longer taking prescription pain medication, you can comfortably wear a seatbelt, and you can safely maneuver your car and apply brakes. °b. RETURN TO WORK:  __________________________________________________________ °9. You should see your doctor in the office for a follow-up appointment approximately 2-3 weeks after your surgery.  Make sure that you call for this appointment within a day or two after you arrive home to insure a convenient appointment time. °10. OTHER INSTRUCTIONS: __________________________________________________________________________________________________________________________ __________________________________________________________________________________________________________________________ °WHEN TO CALL YOUR DOCTOR: °1. Fever over 101.0 °2. Inability to urinate °3. Continued bleeding from incision. °4. Increased pain, redness, or drainage from the incision. °5. Increasing abdominal pain ° °The clinic staff is available to answer your questions during regular business hours.  Please don’t hesitate to call and ask to speak to one of the nurses for clinical concerns.  If you have a medical emergency, go to the nearest emergency room or call 911.  A surgeon from [facility] is always on call at the hospital. °[address], [address], [place], [place]  [address] ? [address] [address], [place], [place]   [address] °[phone] ? [phone] ? FAX [phone] °Web site:   [email or web address] °

## 2015-03-09 NOTE — Op Note (Signed)
[name], [name] NO.:  [id number]  MEDICAL RECORD NO.:  [id number]  LOCATION:  [facility]                         FACILITY:  [facility]  PHYSICIAN:  Isabel Caprice. Hassell Done, MD  DATE OF BIRTH:  April 15, 1940  DATE OF PROCEDURE: DATE OF DISCHARGE:                              OPERATIVE REPORT   PREOPERATIVE DIAGNOSIS:  Suspect internal hernia after marked weight loss from gastric bypass in 2013.  POSTOPERATIVE DIAGNOSIS:  Two defects found and closed, probable sites of internal hernia along with lysis of a bandlike adhesion to the anterior abdominal wall.  PROCEDURE:  Laparoscopy with closure of internal mesenteric defects.  SURGEON:  Isabel Caprice. Hassell Done, M.D.  ASSISTANT:  Fenton Malling. Lucia Gaskins, M.D.  ANESTHESIA:  General endotracheal.  DESCRIPTION OF PROCEDURE:  [name] is a 75 year old gentleman, who underwent a gastric bypass by me in 2013.  He has lost from a BMI of approximately 126 down to BMI of 80.  He did well until several days ago when he presented to [facility] with fairly abrupt onset of abdominal pain, and was admitted and had a CT scan, which suggested that is overall of his mesentery and thought to have an internal hernia. He was admitted and got better and was discharged and saw me in the office yesterday and I scheduled him for laparoscopy today.  The patient was taken to room 6 and after general anesthesia was administered and after a couple of grams of Mefoxin, the abdomen was prepped with PCMX and draped sterilely.  Time-out was performed.  I entered the abdomen through the left upper quadrant with a 5-mm OptiView without difficulty.  He has a very lax abdomen having lost a lot of weight.  We then placed 2 more 5 on the left and one 5 down on the right.  We began by looking over at the cecum, which was somewhat dilated with gas and the terminal ileum tended to go downward and was kind of stuck and adhesed toward the pelvis, but then we  followed it up and picked up the bowel at that point, and ran it backwards to the jejunojejunostomy.  At that point, we flipped it over and could see a potential space.  The mesenteric defect was closed and we saw the silk sutures and that had not opened up.  We ran the BP limb back to the ligament of Treitz.  We ran the Roux limb back up to the [place].  There was a prominent candy cane and beneath the candy cane, we could see a potential space and there was a hole where possibly the small bowel could of slipped up in a Petersen type defect.  I went ahead, there had been a closure of the [place] defect, but this was above this where again it was once a very fatty mesentery nail was pretty much devoid of fat.  Because of that, I went ahead and closed that with 2-0 silk using an Endo Stitch.  This required me to up size the left side lower port to 11.  That was closed with first tying and then using a lapper tie to close that defect.  On the other side in the  BP side, I went ahead and found an area kind of attenuation and again placed another silk suture to further buttress that closure.  This seemed to obliterate that potential space.  We then ran the small bowel twice running back from the jejunojejunostomy back to the right colon, cecum, and the appendix, which was visualized and then, I ran it back, but this time pulling everything down.  I went up and looked at the transverse colon and I could see the potential space possibly there where the Roux limb had been taken out, but I went ahead and closed the mesentery and fat that area to prevent small bowel from getting up into that space.  Surveyed the abdomen and there was a bandlike adhesion kind of in the lower abdomen, stuck to the anterior abdominal wall.  I lysed that.  That would be a potential for bowel obstruction wrapping around that.  The port sites were injected with Exparel.  I ran the bowel again looked around, saw no evidence  of any mesenteric injuries and deflated the abdomen.  The ports were closed with 4-0 Vicryl and LiquiBand.  The patient was taken to the recovery room in satisfactory condition.     Isabel Caprice Hassell Done, MD     MBM/MEDQ  D:  03/08/2015  T:  03/08/2015  Job:  [id number]

## 2015-03-11 ENCOUNTER — Other Ambulatory Visit: Payer: Self-pay

## 2015-03-11 ENCOUNTER — Telehealth: Payer: Self-pay

## 2015-03-11 ENCOUNTER — Telehealth: Payer: Self-pay | Admitting: *Deleted

## 2015-03-11 NOTE — Telephone Encounter (Signed)
Spoke with pt's wife.  Pt restarted Coumadin on Saturday and took extra 1/2 tablet.  Appt made to check INR this Wednesday.

## 2015-03-11 NOTE — Telephone Encounter (Signed)
Pt wife calling asking if patient needs to come in sooner to get coumadin checked. Pt just had procedure and was off it for 8 days  Please advise.

## 2015-03-11 NOTE — Telephone Encounter (Signed)
Patient admitted from 03/08/15-03/09/15 at [id number] due to small bowel obstruction. Patient states surgeon also had to repair 3 tears to intestinal tract. Patient states pain is 7/10. Patient reports Vicodin has been moderately effective. No special diet was prescribed. Patient reports two episodes of vomiting. Patient reports he has f/u with Dr. Hassell Done in 2 wks.     Transition Care Management Follow-up Telephone Call   Date discharged? 03/09/2015   How have you been since you were released from the hospital? Health status improving   Do you understand why you were in the hospital? Yes   Do you understand the discharge instructions? Yes   Where were you discharged to? Yes   Items Reviewed:  Medications reviewed: Yes  Allergies reviewed: Yes  Dietary changes reviewed: Yes  Referrals reviewed: Yes   Functional Questionnaire:   Activities of Daily Living (ADLs):   He states they are independent in the following: independent with ADLs States they require assistance with the following: None   Any transportation issues/concerns?: No, able to drive self   Any patient concerns? None related to hospitalization   Confirmed importance and date/time of follow-up visits scheduled No, patient declined   Confirmed with patient if condition begins to worsen call PCP or go to the ER.  Patient was given the office number and encouraged to call back with question or concerns.  : Yes

## 2015-03-11 NOTE — Telephone Encounter (Signed)
Patient was recently discharged on 03/09/15 after small bowel obstruction repair. He has requested a refill of pain medication. Pain scale 7/10.

## 2015-03-12 MED ORDER — HYDROCODONE-ACETAMINOPHEN 5-325 MG PO TABS: 1.0000 | ORAL_TABLET | ORAL | Status: DC | PRN

## 2015-03-12 NOTE — Telephone Encounter (Signed)
Left message for [name] that his prescription is ready to be picked up at the front desk.

## 2015-03-13 ENCOUNTER — Ambulatory Visit (INDEPENDENT_AMBULATORY_CARE_PROVIDER_SITE_OTHER): Payer: Medicare Other

## 2015-03-13 DIAGNOSIS — I4891 Unspecified atrial fibrillation: Secondary | ICD-10-CM | POA: Diagnosis not present

## 2015-03-13 DIAGNOSIS — Z7901 Long term (current) use of anticoagulants: Secondary | ICD-10-CM

## 2015-03-13 DIAGNOSIS — I824Y9 Acute embolism and thrombosis of unspecified deep veins of unspecified proximal lower extremity: Secondary | ICD-10-CM

## 2015-03-13 DIAGNOSIS — Z5181 Encounter for therapeutic drug level monitoring: Secondary | ICD-10-CM

## 2015-03-13 LAB — POCT INR: INR: 1.4

## 2015-03-20 DIAGNOSIS — Z0000 Encounter for general adult medical examination without abnormal findings: Secondary | ICD-10-CM | POA: Diagnosis not present

## 2015-03-20 DIAGNOSIS — T83490A Other mechanical complication of penile (implanted) prosthesis, initial encounter: Secondary | ICD-10-CM | POA: Diagnosis not present

## 2015-03-20 DIAGNOSIS — R300 Dysuria: Secondary | ICD-10-CM | POA: Diagnosis not present

## 2015-03-22 ENCOUNTER — Ambulatory Visit (INDEPENDENT_AMBULATORY_CARE_PROVIDER_SITE_OTHER): Payer: Medicare Other | Admitting: Neurology

## 2015-03-22 ENCOUNTER — Encounter: Payer: Self-pay | Admitting: Neurology

## 2015-03-22 VITALS — BP 130/68 | HR 86 | Ht 74.0 in | Wt 189.0 lb

## 2015-03-22 DIAGNOSIS — G250 Essential tremor: Secondary | ICD-10-CM

## 2015-03-22 DIAGNOSIS — R202 Paresthesia of skin: Secondary | ICD-10-CM | POA: Diagnosis not present

## 2015-03-22 MED ORDER — PRIMIDONE 50 MG PO TABS: 100.0000 mg | ORAL_TABLET | Freq: Two times a day (BID) | ORAL | Status: DC

## 2015-03-22 NOTE — Patient Instructions (Signed)
1. Increase Primidone to two tablets in the morning, two in the evening.   2. We will call to schedule your EMG.   ELECTROMYOGRAM AND NERVE CONDUCTION STUDIES (EMG/NCS) INSTRUCTIONS  How to Prepare The neurologist conducting the EMG will need to know if you have certain medical conditions. Tell the neurologist and other EMG lab personnel if you: . Have a pacemaker or any other electrical medical device . Take blood-thinning medications . Have hemophilia, a blood-clotting disorder that causes prolonged bleeding Bathing Take a shower or bath shortly before your exam in order to remove oils from your skin. Don't apply lotions or creams before the exam.  What to Expect You'll likely be asked to change into a hospital gown for the procedure and lie down on an examination table. The following explanations can help you understand what will happen during the exam.  . Electrodes. The neurologist or a technician places surface electrodes at various locations on your skin depending on where you're experiencing symptoms. Or the neurologist may insert needle electrodes at different sites depending on your symptoms.  . Sensations. The electrodes will at times transmit a tiny electrical current that you may feel as a twinge or spasm. The needle electrode may cause discomfort or pain that usually ends shortly after the needle is removed. If you are concerned about discomfort or pain, you may want to talk to the neurologist about taking a short break during the exam.  . Instructions. During the needle EMG, the neurologist will assess whether there is any spontaneous electrical activity when the muscle is at rest - activity that isn't present in healthy muscle tissue - and the degree of activity when you slightly contract the muscle.  He or she will give you instructions on resting and contracting a muscle at appropriate times. Depending on what muscles and nerves the neurologist is examining, he or she may ask you  to change positions during the exam.  After your EMG You may experience some temporary, minor bruising where the needle electrode was inserted into your muscle. This bruising should fade within several days. If it persists, contact your primary care doctor.

## 2015-03-22 NOTE — Progress Notes (Signed)
Subjective:    [name] was seen in consultation in the movement disorder clinic at the request of Dr. Joya Salm.  His PCP is Eliezer Lofts, MD.  This patient is accompanied in the office by his child who supplements the history. The evaluation is for tremor.  I reviewed prior records that are available to me.  Looking back in his medical record, there are notes back to 2012 that indicate severe essential tremor.  Pt reports tremor has been worse since recent surgeries.  He had a shoulder surgery for bone spurs first and then had a shoulder replacement on the L and then had a cervical fusion surgery.  Prior to that, he had gastric bypass in 2013.    The patient is a 75 y.o. right handed male with a history of tremor.  Pt reports that tremor started after an episode of sepsis about 5 years ago.  Once he left the hospital, it was in the bilateral UE, overall slight but it has gotten worse over 8 months.  He cannot eat peas or the roll off the spoon.   There is no family hx of tremor.    Affected by caffeine:  No. Affected by alcohol: doesn't drink Affected by stress:  No. Affected by fatigue:  No. Spills soup if on spoon:  Yes.   Spills glass of liquid if full:  No. Affects ADL's (tying shoes, brushing teeth, etc):  No.  Pt had neck hematomas after surgery, which caused dysphagia.  A modified barium swallow was done on 08/14/2013 indicate severe cervical face dysphagia, moderate to severe pharyngeal phase dysphagia.  There is recommended if crush his medications or takes them in liquid form.  Outpatient speech language pathology and nutrition consult was also recommended.  11/01/13 update:  Pt is returning for f/u.  Started primidone last visit.   Pt states that it has been helpful but thinks that we could go up on the medication.  No SE.  Had carpal tunnel release on the L.  States that it didn't help.  Has also gone to speech therapy for swallowing, as recommended and that did not  help.  01/02/14 update:  The patient returns for follow-up today.  He has a history of essential tremor.  Last visit, his primidone was increased 50 mg twice a day.  "I think that if you increased it one more time, we will have it just right."  I got a note from his ENT physician at [facility].  They agreed that dysphagia was related to prior cervical spine surgery.  They told him that loss of balance is not due to an inner ear pathology and he was told to follow-up here.  He does have a history of diabetic peripheral neuropathy.  Pt states that balance has been off since the neck surgery and thinks that it is related.  He states that it actually has gotten slowly better.  Balance isn't great but he isn't falling anymore either which used to be a big problem.  05/02/14 update:  His primidone was increased last visit so that he is taking 100 mg in the morning and he remains on 50 mg at night.  The patient states that tremor has tremor is doing well.  He states that tremor is well controlled.  He only spills something or notices tremor 1-2 days a month, which is markedly improved.  No SE with the tremor.    11/02/14 update:  The patient has a history of essential tremor  and is on primidone, 100 mg in the morning and 50 mg at night. He thinks that tremor is well controlled.   I reviewed records since our last visit.  He has undergone a rather extensive workup for epigastric pain and weight loss.  He is a CT of the abdomen that was negative.  His H. pylori antibodies were negative.  He had a stress Myoview and while there was some evidence of ischemia, the patient did not believe that this was cardiac related and did not want to proceed with a cath.  He has seen gastroenterology and has had an EGD.  He was started on carafate and he thinks that it is helping.  He thinks that much of it is from the gastric bypass as he has emesis after the meals.    11/27/14 update:  Pt is f/u much earlier than expected.  On  primidone, 100 mg in AM, 50 mg at night.  The records that were made available to me were reviewed since last visit.  Had to have penile prosthesis removed that malfunctioned and insert a new one. Also, had surgery for his tear ducts yesterday. His wife states that Sunday AM he was getting out of the shower and he screamed and he felt like he was getting stabbed in the back of the head.  He's had this in the past but not this severe.  It is sore to the touch behind the ear.  It was awful for 15 min (felt like on fire) but if he turns his head to the right, he still feels it.    03/22/15 update:  The patient follows up today regarding his essential tremor.  He is on primidone, 100 mg in the morning and 50 g at night.  Tremor has slightly increased with time.   Last visit, which was in October, we did an occipital nerve block for right occipital neuralgia.  This was done on 11/28/2014.  He states that it did not help and he has been having a daily headache.  He continues to have a posterior occipital headache that radiates to the frontal region but he points to the left side today.  He states that the fingers on the L thumb, pointer and middle finger are numb.  States had Lcarpal tunnel surgery a year ago but these sx's started after shoulder replacement surgery and is convinced that something happened; no neck pain but has had prior neck surgery.  The shoulder replacement surgery was done first and the CTS was done a month later.     I did review records since our last visit.  He underwent surgery for an internal hernia repair on 03/08/2015.  Current/Previously tried tremor medications: no  Current medications that may exacerbate tremor:  n/a  Outside reports reviewed: historical medical records, lab reports and referral letter/letters.  Allergies  Allergen Reactions  . Sulfacetamide Sodium     REACTION: Throat swelling  . Adhesive [Tape]     Tears skin-- "No tape of any kind, they all tear skin right  off"    Current Outpatient Prescriptions on File Prior to Visit  Medication Sig Dispense Refill  . atorvastatin (LIPITOR) 80 MG tablet Take 1 tablet by mouth at  bedtime 90 tablet 0  . FLUoxetine (PROZAC) 40 MG capsule Take 1 capsule by mouth  daily 90 capsule 0  . furosemide (LASIX) 20 MG tablet Take 1 to 2 tablets by  mouth daily 180 tablet 1  . gabapentin (NEURONTIN) 300  MG capsule Take 300 mg by mouth daily.     [name] lisinopril (PRINIVIL,ZESTRIL) 2.5 MG tablet Take 2.5 mg by mouth every morning.     . pantoprazole (PROTONIX) 40 MG tablet Take 40 mg by mouth 2 (two) times daily.    [name] warfarin (COUMADIN) 2.5 MG tablet Take 1 to 1.5 tablets by mouth daily or as directed by coumadin clinic (Patient taking differently: Take 2.5 mg by mouth daily at 6 PM. Take 2.5mg  every day other than Mon, Wed, and Saturday take 3.75mg s) 105 tablet 1  . zolpidem (AMBIEN) 10 MG tablet TAKE 1/2-1 TABLET BY MOUTH AT BEDTIME 30 tablet 0   No current facility-administered medications on file prior to visit.    Past Medical History  Diagnosis Date  . Diverticulosis of colon (without mention of hemorrhage)   . Restless legs syndrome (RLS)   . Anxiety state, unspecified   . Hx of gout     but doesn't take any meds  . Bruises easily     pt is on Coumadin  . Cancer ([place])     SKIN CANCER REMOVED  . Depressive disorder, not elsewhere classified     takes Prozac daily  . History of blood clots     behind right knee and then went into left lung 29yrs ago  . Peripheral edema     takes Furosemide daily  . Unspecified asthma(493.90)     as a child  . Pneumonia     last time about 20yrs ago  . Joint pain   . History of colon polyps   . History of kidney stones   . Hard of hearing     wears hearing aids  . Chronic atrial fibrillation (HCC)     a.fib on warfarin- Dr. Percival Spanish follows  . Peripheral vascular disease ([place])   . Pulmonary embolism ([place])     over 10 yrs ago "many years ago"  . ED (erectile  dysfunction)   . Hx of diabetes mellitus     "no longer diabetic since gastric bypass" - no longer taking metformin"  in 2 months "problems with low blood sugar now"  . Unspecified essential hypertension     hx of no longer on medication due to gastric bypass   . GERD (gastroesophageal reflux disease)   . Arthritis     hands   . Inflammation of colonic mucosa     recent admission and release from [facility] 02-28-15-remains on oral antibiotic    Past Surgical History  Procedure Laterality Date  . Knee surgery Right     x 4  . Artery repair      Left forearm  . Foot surgery      Left foot   . Replacement total knee Left     x 2  . Breath tek h pylori  01/08/2011    Procedure: BREATH TEK H PYLORI;  Surgeon: Pedro Earls, MD;  Location: [facility];  Service: General;  Laterality: N/A;  . Hand surgery      LEFT  . Leg surgery      FOR NECROTIZING FASCITIS L LEG AND GROIN  . 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 Shoulder arthroscopy    . Shoulder surgery Left 2014  . Back surgery      x 3  . Knee surgery Left  arthroscopy  . Eye surgery      cataract bil  . Injections in back      x 18  . Colonoscopy    . Cystoscopy    . Total shoulder arthroplasty Left 01/26/2013    Procedure: TOTAL SHOULDER ARTHROPLASTY;  Surgeon: Nita Sells, MD;  Location: [facility];  Service: Orthopedics;  Laterality: Left;  Left total shoulder arthroplasty  . Anterior cervical decomp/discectomy fusion N/A 05/09/2013    Procedure: ANTERIOR CERVICAL DECOMPRESSION/DISCECTOMY FUSION CERVICAL THREE-FOUR,CERVICAL FOUR-FIVE,CERVICAL FIVE-SIX;  Surgeon: Floyce Stakes, MD;  Location: [facility];  Service: Neurosurgery;  Laterality: N/A;  . Penile prosthesis implant N/A 07/02/2014    Procedure: PENILE PROTHESIS INFLATABLE 3 PIECE (COLOPLAST)  SCROTAL APPROACH;  Surgeon: Kathie Rhodes, MD;  Location: [facility];  Service: Urology;  Laterality: N/A;  . Esophagogastroduodenoscopy (egd) with propofol N/A 09/21/2014    Procedure: ESOPHAGOGASTRODUODENOSCOPY (EGD) WITH PROPOFOL;  Surgeon: Manya Silvas, MD;  Location: [facility];  Service: Endoscopy;  Laterality: N/A;  . Penile prosthesis implant N/A 11/23/2014    Procedure: EXPLORATION AND REVISION OF PENILE PROSTHESIS;  Surgeon: Kathie Rhodes, MD;  Location[facility];  Service: Urology;  Laterality: N/A;  . Cataract extraction, bilateral    . Tonsillectomy    . Cardioversion      x 2 attempts-unsuccessful.  . Laparoscopic internal hernia repair N/A 03/08/2015    Procedure: LAPAROSCOPIC INTERNAL HERNIA REPAIR ;  Surgeon: [name], MD;  Location[facility];  Service: General;  Laterality: N/A;    Social History   Social History  . Marital Status: Married    Spouse Name: N/A  . Number of Children: 1  . Years of Education: N/A   Occupational History  . Retired     Administrator   Social History Main Topics  . Smoking status: Never Smoker   . Smokeless tobacco: Never Used  . Alcohol Use: No  . Drug Use: No  . Sexual Activity: Yes   Other Topics Concern  . Not on file   Social History Narrative   DIET: 3 meals, F&V, some water, crystal light, No fast food   Exercise: walks treadmill 30 min      1 Caffeine drinks daily       No living will.           Family Status  Relation Status Death Age  . Mother Deceased     breast/uterine cancer  . Father Deceased     emphysema, heart disease  . Sister Deceased     alzheimer's disease  . Brother Deceased     struck by lightening  . Brother Deceased     colon cancer  . Brother Deceased     heart disease  . Brother Alive     prostate cancer  . Daughter Alive     healthy    Review of Systems   A complete 10 system ROS was obtained and was negative apart from what is mentioned.   Objective:   VITALS:   Filed  Vitals:   03/22/15 0859  BP: 130/68  Pulse: 86  Height: 6\' 2"  (1.88 m)  Weight: 189 lb (85.73 kg)   Wt Readings from Last 3 Encounters:  03/22/15 189 lb (85.73 kg)  03/09/15 196 lb 3.4 oz (89 kg)  03/07/15 186 lb 6 oz (84.539 kg)     Gen:  Appears stated age and in NAD. HEENT:  Normocephalic, atraumatic. The mucous membranes are moist. The superficial temporal  arteries are without ropiness or tenderness. CV:  RRR Lungs: CTAB Neck:  No bruits. There is significant tenderness over the right occipital notch   NEUROLOGICAL:  Orientation:  The patient is alert and oriented x 3.  Recent and remote memory are intact.  Attention span and concentration are normal.  Able to name objects and repeat without trouble.  Fund of knowledge is appropriate Cranial nerves: There is good facial symmetry, but there is L ptosis (I looked at 0000000 drivers license and it was the same). Extraocular muscles are intact and visual fields are full to confrontational testing. Speech is fluent and clear. Soft palate rises symmetrically and there is no tongue deviation. Hearing is intact to conversational tone. Tone: Tone is good throughout.  Mild gegenhalten. Sensation: Sensation is intact to light touch throughout.  Hands are cold bilaterally. Coordination:  The patient has no dysdiadichokinesia or dysmetria. Motor: Strength is 5/5 in the bilateral upper and lower extremities.  Shoulder shrug is equal bilaterally.  There is no pronator drift.   Gait and Station: The patient ambulates well today  MOVEMENT EXAM: Tremor:  There is mild tremor of the outstretched hands.   LABS    Chemistry      Component Value Date/Time   NA 136 03/09/2015 0500   K 4.3 03/09/2015 0500   CL 104 03/09/2015 0500   CO2 25 03/09/2015 0500   BUN 17 03/09/2015 0500   CREATININE 1.44* 03/09/2015 0500      Component Value Date/Time   CALCIUM 8.5* 03/09/2015 0500   ALKPHOS 68 03/07/2015 1245   AST 32 03/07/2015 1245   ALT 41  03/07/2015 1245   BILITOT 0.3 03/07/2015 1245          Assessment/Plan:   1.  Essential Tremor.  -improved on primidone.  Continue with two in the AM (100 mg) and increase PM dosage to 100mg  at night.  Risks, benefits, side effects and alternative therapies were discussed.  The opportunity to ask questions was given and they were answered to the best of my ability.  The patient expressed understanding and willingness to follow the outlined treatment protocols. 2.  Gait instability  -It is believed that ET alone can cause ataxia/balance changes due to changes in the cerebellar purkinje cell/climbing fiber synaptic transmission.   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  -looked better in this regard today 3.  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  Pt wanted lid lift but ophthalmology didn't want to proceed  5.  Headache  -no topamax due to hx of nephrolithiasis.  Talked about rebound and asked him to avoid too many analgesics.  Will see if increasing primidone helps at all.   6.  Hand paresthesias, L thumb, pointer and middle finger  -probably due to residual CTS but pt doesn't think so.  Will do L EMG 5.  Return in about 3 months (around 06/19/2015).  Greater than 50% of 25 min visit in counseling re: safety.

## 2015-03-23 ENCOUNTER — Other Ambulatory Visit: Payer: Self-pay | Admitting: Family Medicine

## 2015-03-23 NOTE — Telephone Encounter (Signed)
Last filled 01/31/15--please advise

## 2015-03-25 ENCOUNTER — Other Ambulatory Visit: Payer: Self-pay | Admitting: Gastroenterology

## 2015-03-25 NOTE — Telephone Encounter (Addendum)
Zolpidem called into [facility]. 

## 2015-03-26 ENCOUNTER — Telehealth: Payer: Self-pay | Admitting: Neurology

## 2015-03-26 ENCOUNTER — Ambulatory Visit (INDEPENDENT_AMBULATORY_CARE_PROVIDER_SITE_OTHER): Payer: Medicare Other | Admitting: Neurology

## 2015-03-26 DIAGNOSIS — R202 Paresthesia of skin: Secondary | ICD-10-CM | POA: Diagnosis not present

## 2015-03-26 DIAGNOSIS — Z9884 Bariatric surgery status: Secondary | ICD-10-CM | POA: Diagnosis not present

## 2015-03-26 DIAGNOSIS — Z79899 Other long term (current) drug therapy: Secondary | ICD-10-CM | POA: Diagnosis not present

## 2015-03-26 DIAGNOSIS — E1142 Type 2 diabetes mellitus with diabetic polyneuropathy: Secondary | ICD-10-CM | POA: Diagnosis not present

## 2015-03-26 DIAGNOSIS — Z5181 Encounter for therapeutic drug level monitoring: Secondary | ICD-10-CM | POA: Diagnosis not present

## 2015-03-26 NOTE — Telephone Encounter (Signed)
-----   Message from [place], DO sent at 03/26/2015  3:03 PM EST ----- Please let pt know that nerves from neck, elbow and carpal tunnel down the L arm all look good and intact.  That doesn't mean he doesn't have arthritis issues, but the nerves themselves look healthy

## 2015-03-26 NOTE — Telephone Encounter (Signed)
Patient made aware of results.  

## 2015-03-26 NOTE — Procedures (Signed)
[facility]  [place], [place]  [place], [place] [address] Tel: [phone] Fax:  [phone] Test Date:  03/26/2015  Patient: [name] DOB: 05/02/1940 Physician: Narda Amber, DO  Sex: Male Height: 6\' 2"  Ref Phys: Alonza Bogus, M.D.  ID#: [id number] Temp: 32.5C Technician: Jerilynn Mages. Dean   Patient Complaints: This is a 75 year old gentleman referred for evaluation of numbness involving the first 3 fingers on the left. He has a history of carpal tunnel release on the side.  NCV & EMG Findings: Extensive electrodiagnostic testing of the left upper extremity shows: 1. Left median, ulnar, and palmar sensory responses are within normal limits P 2. Left median and ulnar motor responses are within normal limits. 3. There is no evidence of active or chronic motor axon loss changes affecting any of the tested muscles. Motor unit configuration and recruitment pattern is within normal limits.  Impression: This is a normal study of the left upper extremity. In particular, there is no evidence of a cervical radiculopathy or residuals of carpal tunnel syndrome.   ___________________________ Narda Amber, DO    Nerve Conduction Studies Anti Sensory Summary Table   Site NR Peak (ms) Norm Peak (ms) P-T Amp (V) Norm P-T Amp  Left Median Anti Sensory (2nd Digit)  32.5C  Wrist    3.7 <3.8 14.2 >10  Left Ulnar Anti Sensory (5th Digit)  32.5C  Wrist    3.0 <3.2 8.9 >5   Motor Summary Table   Site NR Onset (ms) Norm Onset (ms) O-P Amp (mV) Norm O-P Amp Site1 Site2 Delta-0 (ms) Dist (cm) Vel (m/s) Norm Vel (m/s)  Left Median Motor (Abd Poll Brev)  32.5C  Wrist    3.9 <4.0 6.6 >5 Elbow Wrist 5.1 27.0 53 >50  Elbow    9.0  6.3         Left Ulnar Motor (Abd Dig Minimi)  32.5C  Wrist    3.0 <3.1 7.3 >7 B Elbow Wrist 4.7 24.0 51 >50  B Elbow    7.7  7.2  A Elbow B Elbow 1.8 10.0 56 >50  A Elbow    9.5  7.3          Comparison Summary Table   Site NR Peak (ms) Norm Peak  (ms) P-T Amp (V) Site1 Site2 Delta-P (ms) Norm Delta (ms)  Left Median/Ulnar Palm Comparison (Wrist - 8cm)  32.5C  Median Palm    1.8 <2.2 70.8 Median Palm Ulnar Palm 0.0   Ulnar Palm    1.8 <2.2 8.6       EMG   Side Muscle Ins Act Fibs Psw Fasc Number Recrt Dur Dur. Amp Amp. Poly Poly. Comment  Left 1stDorInt Nml Nml Nml Nml Nml Nml Nml Nml Nml Nml Nml Nml N/A  Left Abd Poll Brev Nml Nml Nml Nml Nml Nml Nml Nml Nml Nml Nml Nml N/A  Left Ext Indicis Nml Nml Nml Nml Nml Nml Nml Nml Nml Nml Nml Nml N/A  Left PronatorTeres Nml Nml Nml Nml Nml Nml Nml Nml Nml Nml Nml Nml N/A  Left Biceps Nml Nml Nml Nml Nml Nml Nml Nml Nml Nml Nml Nml N/A  Left Triceps Nml Nml Nml Nml Nml Nml Nml Nml Nml Nml Nml Nml N/A  Left Deltoid Nml Nml Nml Nml Nml Nml Nml Nml Nml Nml Nml Nml N/A      Waveforms:

## 2015-03-26 NOTE — Telephone Encounter (Signed)
Left message on machine for patient to call back.

## 2015-03-27 ENCOUNTER — Ambulatory Visit (INDEPENDENT_AMBULATORY_CARE_PROVIDER_SITE_OTHER): Payer: Medicare Other

## 2015-03-27 DIAGNOSIS — I4891 Unspecified atrial fibrillation: Secondary | ICD-10-CM

## 2015-03-27 DIAGNOSIS — I824Y9 Acute embolism and thrombosis of unspecified deep veins of unspecified proximal lower extremity: Secondary | ICD-10-CM

## 2015-03-27 DIAGNOSIS — Z5181 Encounter for therapeutic drug level monitoring: Secondary | ICD-10-CM

## 2015-03-27 DIAGNOSIS — Z7901 Long term (current) use of anticoagulants: Secondary | ICD-10-CM

## 2015-03-27 LAB — POCT INR: INR: 2.4

## 2015-03-28 DIAGNOSIS — D0461 Carcinoma in situ of skin of right upper limb, including shoulder: Secondary | ICD-10-CM | POA: Diagnosis not present

## 2015-03-28 DIAGNOSIS — Z85828 Personal history of other malignant neoplasm of skin: Secondary | ICD-10-CM | POA: Diagnosis not present

## 2015-03-28 DIAGNOSIS — L570 Actinic keratosis: Secondary | ICD-10-CM | POA: Diagnosis not present

## 2015-03-28 DIAGNOSIS — L578 Other skin changes due to chronic exposure to nonionizing radiation: Secondary | ICD-10-CM | POA: Diagnosis not present

## 2015-03-28 DIAGNOSIS — L820 Inflamed seborrheic keratosis: Secondary | ICD-10-CM | POA: Diagnosis not present

## 2015-03-28 DIAGNOSIS — D0439 Carcinoma in situ of skin of other parts of face: Secondary | ICD-10-CM | POA: Diagnosis not present

## 2015-03-28 DIAGNOSIS — D485 Neoplasm of uncertain behavior of skin: Secondary | ICD-10-CM | POA: Diagnosis not present

## 2015-03-28 DIAGNOSIS — D692 Other nonthrombocytopenic purpura: Secondary | ICD-10-CM | POA: Diagnosis not present

## 2015-03-29 NOTE — Discharge Summary (Signed)
Physician Discharge Summary  Patient ID: [name] MRN: [id number] DOB/AGE: 1940-03-17 75 y.o.  Admit date: 03/08/2015 Discharge date: 03/29/2015  Admission Diagnoses:  Internal hernia post gastric bypass  Discharge Diagnoses:  Same   Active Problems:   Internal hernia   Surgery:  Laparoscopic repair of internal hernia and lysis of adhesions  Discharged Condition: improved  Hospital Course:   Had laparoscopy and above surgery.  Kept overnight and did well.  Ready for discharge over the weekend  Consults: none  Significant Diagnostic Studies: none     Discharge Exam: Blood pressure 92/58, pulse 49, temperature 97.8 F (36.6 C), temperature source Oral, resp. rate 16, height 6\' 2"  (1.88 m), weight 89 kg (196 lb 3.4 oz), SpO2 100 %. Incisions OK  Disposition: 01-Home or Self Care  Discharge Instructions    Discharge patient    Complete by:  As directed             Medication List    STOP taking these medications        acetaminophen 500 MG tablet  Commonly known as:  TYLENOL     ciprofloxacin 500 MG tablet  Commonly known as:  CIPRO     diphenhydrAMINE 25 MG tablet  Commonly known as:  BENADRYL     HYDROcodone-acetaminophen 5-325 MG tablet  Commonly known as:  NORCO/VICODIN     naphazoline-pheniramine 0.025-0.3 % ophthalmic solution  Commonly known as:  NAPHCON-A     nystatin cream  Commonly known as:  MYCOSTATIN     primidone 50 MG tablet  Commonly known as:  MYSOLINE     sucralfate 1 g tablet  Commonly known as:  CARAFATE     tamsulosin 0.4 MG Caps capsule  Commonly known as:  FLOMAX     TRIAMCINOLONE & EMOLLIENT EX     zolpidem 10 MG tablet  Commonly known as:  AMBIEN      TAKE these medications        atorvastatin 80 MG tablet  Commonly known as:  LIPITOR  Take 1 tablet by mouth at  bedtime     FLUoxetine 40 MG capsule  Commonly known as:  PROZAC  Take 1 capsule by mouth  daily     furosemide 20 MG tablet  Commonly known  as:  LASIX  Take 1 to 2 tablets by  mouth daily     gabapentin 300 MG capsule  Commonly known as:  NEURONTIN  Take 300 mg by mouth daily.     lisinopril 2.5 MG tablet  Commonly known as:  PRINIVIL,ZESTRIL  Take 2.5 mg by mouth every morning.     pantoprazole 40 MG tablet  Commonly known as:  PROTONIX  Take 40 mg by mouth 2 (two) times daily.     warfarin 2.5 MG tablet  Commonly known as:  COUMADIN  Take 1 to 1.5 tablets by mouth daily or as directed by coumadin clinic           Follow-up Information    Follow up with [name] B, MD. Schedule an appointment as soon as possible for a visit in 2 weeks.   Specialty:  General Surgery   Contact information:   [place] [address] [phone]       Signed: Pedro Earls 03/29/2015, 7:39 AM

## 2015-04-03 DIAGNOSIS — S0501XA Injury of conjunctiva and corneal abrasion without foreign body, right eye, initial encounter: Secondary | ICD-10-CM | POA: Diagnosis not present

## 2015-04-08 DIAGNOSIS — E119 Type 2 diabetes mellitus without complications: Secondary | ICD-10-CM | POA: Diagnosis not present

## 2015-04-08 DIAGNOSIS — Z794 Long term (current) use of insulin: Secondary | ICD-10-CM | POA: Diagnosis not present

## 2015-04-12 DIAGNOSIS — J45909 Unspecified asthma, uncomplicated: Secondary | ICD-10-CM | POA: Diagnosis not present

## 2015-04-12 DIAGNOSIS — E1122 Type 2 diabetes mellitus with diabetic chronic kidney disease: Secondary | ICD-10-CM | POA: Diagnosis not present

## 2015-04-12 DIAGNOSIS — N189 Chronic kidney disease, unspecified: Secondary | ICD-10-CM | POA: Diagnosis not present

## 2015-04-12 DIAGNOSIS — Z79899 Other long term (current) drug therapy: Secondary | ICD-10-CM | POA: Diagnosis not present

## 2015-04-12 DIAGNOSIS — N529 Male erectile dysfunction, unspecified: Secondary | ICD-10-CM | POA: Diagnosis not present

## 2015-04-12 DIAGNOSIS — N183 Chronic kidney disease, stage 3 (moderate): Secondary | ICD-10-CM | POA: Diagnosis not present

## 2015-04-12 DIAGNOSIS — I739 Peripheral vascular disease, unspecified: Secondary | ICD-10-CM | POA: Diagnosis not present

## 2015-04-12 DIAGNOSIS — Z7984 Long term (current) use of oral hypoglycemic drugs: Secondary | ICD-10-CM | POA: Diagnosis not present

## 2015-04-12 DIAGNOSIS — T83490A Other mechanical complication of penile (implanted) prosthesis, initial encounter: Secondary | ICD-10-CM | POA: Diagnosis not present

## 2015-04-12 DIAGNOSIS — I482 Chronic atrial fibrillation: Secondary | ICD-10-CM | POA: Diagnosis not present

## 2015-04-12 DIAGNOSIS — T839XXA Unspecified complication of genitourinary prosthetic device, implant and graft, initial encounter: Secondary | ICD-10-CM | POA: Diagnosis not present

## 2015-04-12 DIAGNOSIS — R34 Anuria and oliguria: Secondary | ICD-10-CM | POA: Diagnosis not present

## 2015-04-12 DIAGNOSIS — Z9884 Bariatric surgery status: Secondary | ICD-10-CM | POA: Diagnosis not present

## 2015-04-12 DIAGNOSIS — I129 Hypertensive chronic kidney disease with stage 1 through stage 4 chronic kidney disease, or unspecified chronic kidney disease: Secondary | ICD-10-CM | POA: Diagnosis not present

## 2015-04-12 DIAGNOSIS — K219 Gastro-esophageal reflux disease without esophagitis: Secondary | ICD-10-CM | POA: Diagnosis not present

## 2015-04-12 DIAGNOSIS — E785 Hyperlipidemia, unspecified: Secondary | ICD-10-CM | POA: Diagnosis not present

## 2015-04-13 DIAGNOSIS — E1122 Type 2 diabetes mellitus with diabetic chronic kidney disease: Secondary | ICD-10-CM | POA: Diagnosis not present

## 2015-04-13 DIAGNOSIS — N183 Chronic kidney disease, stage 3 (moderate): Secondary | ICD-10-CM | POA: Diagnosis not present

## 2015-04-13 DIAGNOSIS — I129 Hypertensive chronic kidney disease with stage 1 through stage 4 chronic kidney disease, or unspecified chronic kidney disease: Secondary | ICD-10-CM | POA: Diagnosis not present

## 2015-04-13 DIAGNOSIS — T83490A Other mechanical complication of penile (implanted) prosthesis, initial encounter: Secondary | ICD-10-CM | POA: Diagnosis not present

## 2015-04-13 DIAGNOSIS — K219 Gastro-esophageal reflux disease without esophagitis: Secondary | ICD-10-CM | POA: Diagnosis not present

## 2015-04-13 DIAGNOSIS — N529 Male erectile dysfunction, unspecified: Secondary | ICD-10-CM | POA: Diagnosis not present

## 2015-04-24 ENCOUNTER — Ambulatory Visit (INDEPENDENT_AMBULATORY_CARE_PROVIDER_SITE_OTHER): Payer: Medicare Other

## 2015-04-24 DIAGNOSIS — I824Y9 Acute embolism and thrombosis of unspecified deep veins of unspecified proximal lower extremity: Secondary | ICD-10-CM

## 2015-04-24 DIAGNOSIS — Z7901 Long term (current) use of anticoagulants: Secondary | ICD-10-CM | POA: Diagnosis not present

## 2015-04-24 DIAGNOSIS — Z5181 Encounter for therapeutic drug level monitoring: Secondary | ICD-10-CM | POA: Diagnosis not present

## 2015-04-24 DIAGNOSIS — I4891 Unspecified atrial fibrillation: Secondary | ICD-10-CM

## 2015-04-24 LAB — POCT INR: INR: 2.3

## 2015-04-25 ENCOUNTER — Other Ambulatory Visit: Payer: Self-pay | Admitting: Family Medicine

## 2015-04-25 DIAGNOSIS — B0089 Other herpesviral infection: Secondary | ICD-10-CM | POA: Diagnosis not present

## 2015-04-25 DIAGNOSIS — B009 Herpesviral infection, unspecified: Secondary | ICD-10-CM | POA: Diagnosis not present

## 2015-04-25 DIAGNOSIS — B372 Candidiasis of skin and nail: Secondary | ICD-10-CM | POA: Diagnosis not present

## 2015-04-25 DIAGNOSIS — Z85828 Personal history of other malignant neoplasm of skin: Secondary | ICD-10-CM | POA: Diagnosis not present

## 2015-04-25 NOTE — Telephone Encounter (Signed)
Last office visit 10/01/2014.  Last refilled 03/25/2015 for #30 with no refills.  Ok to refill?

## 2015-04-26 NOTE — Telephone Encounter (Signed)
Rx called in as directed.   

## 2015-05-08 DIAGNOSIS — R12 Heartburn: Secondary | ICD-10-CM | POA: Diagnosis not present

## 2015-05-08 DIAGNOSIS — R1013 Epigastric pain: Secondary | ICD-10-CM | POA: Diagnosis not present

## 2015-05-15 ENCOUNTER — Telehealth: Payer: Self-pay | Admitting: Family Medicine

## 2015-05-15 DIAGNOSIS — M1711 Unilateral primary osteoarthritis, right knee: Secondary | ICD-10-CM | POA: Diagnosis not present

## 2015-05-15 NOTE — Telephone Encounter (Signed)
LM fot pt to sch AWV, mn

## 2015-05-18 ENCOUNTER — Other Ambulatory Visit: Payer: Self-pay | Admitting: Family Medicine

## 2015-05-19 NOTE — Telephone Encounter (Signed)
Last office visit 10/01/2014.  Last refilled 04/26/2015 for #30 with no refills.  Ok to refill?

## 2015-05-20 ENCOUNTER — Other Ambulatory Visit: Payer: Self-pay | Admitting: Family Medicine

## 2015-05-20 ENCOUNTER — Other Ambulatory Visit: Payer: Self-pay | Admitting: Cardiology

## 2015-05-20 NOTE — Telephone Encounter (Signed)
Zolpidem called into [facility]. 

## 2015-05-20 NOTE — Telephone Encounter (Signed)
Last office visit 10/01/2014.  Last CPE 01/23/2014.  Last Lipid 01/05/2014.  No future appointments.  Refill?

## 2015-05-21 NOTE — Telephone Encounter (Signed)
Please call and schedule Medicare Wellness with fasting labs prior.  Then route back to me so I can refill his medications to get him to that appointment.

## 2015-05-21 NOTE — Telephone Encounter (Signed)
Needs CPX with labs prior unless done elsewhere.[name] Refill until then.

## 2015-05-21 NOTE — Telephone Encounter (Signed)
Left message asking pt to call office  °

## 2015-05-22 NOTE — Telephone Encounter (Signed)
Labs and medicare wellness with lisa 4/28 cpx with dr Diona Browner 5/4 Spouse aware please close

## 2015-05-24 LAB — HM DIABETES EYE EXAM

## 2015-05-29 ENCOUNTER — Ambulatory Visit (INDEPENDENT_AMBULATORY_CARE_PROVIDER_SITE_OTHER): Payer: Medicare Other

## 2015-05-29 DIAGNOSIS — I4891 Unspecified atrial fibrillation: Secondary | ICD-10-CM | POA: Diagnosis not present

## 2015-05-29 DIAGNOSIS — Z5181 Encounter for therapeutic drug level monitoring: Secondary | ICD-10-CM | POA: Diagnosis not present

## 2015-05-29 DIAGNOSIS — L564 Polymorphous light eruption: Secondary | ICD-10-CM | POA: Diagnosis not present

## 2015-05-29 DIAGNOSIS — Z7901 Long term (current) use of anticoagulants: Secondary | ICD-10-CM | POA: Diagnosis not present

## 2015-05-29 DIAGNOSIS — I824Y9 Acute embolism and thrombosis of unspecified deep veins of unspecified proximal lower extremity: Secondary | ICD-10-CM | POA: Diagnosis not present

## 2015-05-29 DIAGNOSIS — Z85828 Personal history of other malignant neoplasm of skin: Secondary | ICD-10-CM | POA: Diagnosis not present

## 2015-05-29 LAB — POCT INR: INR: 2.5

## 2015-06-04 ENCOUNTER — Telehealth: Payer: Self-pay | Admitting: Family Medicine

## 2015-06-04 DIAGNOSIS — E7800 Pure hypercholesterolemia, unspecified: Secondary | ICD-10-CM

## 2015-06-04 DIAGNOSIS — D509 Iron deficiency anemia, unspecified: Secondary | ICD-10-CM

## 2015-06-04 DIAGNOSIS — E119 Type 2 diabetes mellitus without complications: Secondary | ICD-10-CM

## 2015-06-04 NOTE — Telephone Encounter (Signed)
-----   Message from Marchia Bond sent at 05/30/2015 10:48 AM EDT ----- Regarding: Cpx labs Wed 4/26, need orders. Thanks! :-) Please order  future cpx labs for pt's upcoming lab appt. Thanks Aniceto Boss

## 2015-06-05 ENCOUNTER — Ambulatory Visit (INDEPENDENT_AMBULATORY_CARE_PROVIDER_SITE_OTHER): Payer: Medicare Other

## 2015-06-05 ENCOUNTER — Other Ambulatory Visit (INDEPENDENT_AMBULATORY_CARE_PROVIDER_SITE_OTHER): Payer: Medicare Other

## 2015-06-05 VITALS — BP 124/80 | HR 74 | Temp 98.6°F | Ht 74.0 in | Wt 192.5 lb

## 2015-06-05 DIAGNOSIS — E119 Type 2 diabetes mellitus without complications: Secondary | ICD-10-CM

## 2015-06-05 DIAGNOSIS — E7800 Pure hypercholesterolemia, unspecified: Secondary | ICD-10-CM | POA: Diagnosis not present

## 2015-06-05 DIAGNOSIS — R1084 Generalized abdominal pain: Secondary | ICD-10-CM | POA: Diagnosis not present

## 2015-06-05 DIAGNOSIS — Z0000 Encounter for general adult medical examination without abnormal findings: Secondary | ICD-10-CM | POA: Diagnosis not present

## 2015-06-05 LAB — COMPREHENSIVE METABOLIC PANEL
ALBUMIN: 3.9 g/dL (ref 3.5–5.2)
ALK PHOS: 74 U/L (ref 39–117)
ALT: 42 U/L (ref 0–53)
AST: 30 U/L (ref 0–37)
BILIRUBIN TOTAL: 0.4 mg/dL (ref 0.2–1.2)
BUN: 19 mg/dL (ref 6–23)
CALCIUM: 8.8 mg/dL (ref 8.4–10.5)
CO2: 26 mEq/L (ref 19–32)
CREATININE: 1.55 mg/dL — AB (ref 0.40–1.50)
Chloride: 109 mEq/L (ref 96–112)
GFR: 46.87 mL/min — ABNORMAL LOW (ref >60.00)
Glucose, Bld: 132 mg/dL — ABNORMAL HIGH (ref 70–99)
Potassium: 4.7 mEq/L (ref 3.5–5.1)
Sodium: 141 mEq/L (ref 135–145)
Total Protein: 6.5 g/dL (ref 6.0–8.3)

## 2015-06-05 LAB — LIPID PANEL
CHOL/HDL RATIO: 2
CHOLESTEROL: 126 mg/dL (ref 0–200)
HDL: 55.3 mg/dL (ref >39.00)
LDL Cholesterol: 56 mg/dL (ref 0–99)
NonHDL: 70.2
TRIGLYCERIDES: 70 mg/dL (ref 0.0–149.0)
VLDL: 14 mg/dL (ref 0.0–40.0)

## 2015-06-05 LAB — HEMOGLOBIN A1C: Hgb A1c MFr Bld: 6.8 % — ABNORMAL HIGH (ref 4.6–6.5)

## 2015-06-05 NOTE — Progress Notes (Signed)
I reviewed health advisor's note, was available for consultation, and agree with documentation and plan.  

## 2015-06-05 NOTE — Progress Notes (Signed)
Subjective:   [name] is a 75 y.o. male who presents for Medicare Annual/Subsequent preventive examination.       Objective:    Vitals: BP 124/80 mmHg  Pulse 74  Temp(Src) 98.6 F (37 C) (Oral)  Ht 6\' 2"  (1.88 m)  Wt 192 lb 8 oz (87.317 kg)  BMI 24.70 kg/m2  SpO2 99%  Body mass index is 24.7 kg/(m^2).  Tobacco History  Smoking status  . Never Smoker   Smokeless tobacco  . Never Used     Counseling given: No   Past Medical History  Diagnosis Date  . Diverticulosis of colon (without mention of hemorrhage)   . Restless legs syndrome (RLS)   . Anxiety state, unspecified   . Hx of gout     but doesn't take any meds  . Bruises easily     pt is on Coumadin  . Cancer ([facility])     SKIN CANCER REMOVED  . Depressive disorder, not elsewhere classified     takes Prozac daily  . History of blood clots     behind right knee and then went into left lung 43yrs ago  . Peripheral edema     takes Furosemide daily  . Unspecified asthma(493.90)     as a child  . Pneumonia     last time about 54yrs ago  . Joint pain   . History of colon polyps   . History of kidney stones   . Hard of hearing     wears hearing aids  . Chronic atrial fibrillation (HCC)     a.fib on warfarin- Dr. Percival Spanish follows  . Peripheral vascular disease ([place])   . Pulmonary embolism ([place])     over 10 yrs ago "many years ago"  . ED (erectile dysfunction)   . Hx of diabetes mellitus     "no longer diabetic since gastric bypass" - no longer taking metformin"  in 2 months "problems with low blood sugar now"  . Unspecified essential hypertension     hx of no longer on medication due to gastric bypass   . GERD (gastroesophageal reflux disease)   . Arthritis     hands   . Inflammation of colonic mucosa     recent admission and release from [facility] 02-28-15-remains on oral antibiotic   Past Surgical History  Procedure Laterality Date  . Knee surgery Right     x 4  . Artery repair      Left forearm    . Foot surgery      Left foot   . Replacement total knee Left     x 2  . Breath tek h pylori  01/08/2011    Procedure: BREATH TEK H PYLORI;  Surgeon: Pedro Earls, MD;  Location: [facility];  Service: General;  Laterality: N/A;  . Hand surgery      LEFT  . Leg surgery      FOR NECROTIZING FASCITIS L LEG AND GROIN  . 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 Shoulder arthroscopy    . Shoulder surgery Left 2014  . Back surgery      x 3  . Knee surgery Left     arthroscopy  . Eye surgery      cataract bil  . Injections in back      x 18  .  Colonoscopy    . Cystoscopy    . Total shoulder arthroplasty Left 01/26/2013    Procedure: TOTAL SHOULDER ARTHROPLASTY;  Surgeon: Nita Sells, MD;  Location: [facility];  Service: Orthopedics;  Laterality: Left;  Left total shoulder arthroplasty  . Anterior cervical decomp/discectomy fusion N/A 05/09/2013    Procedure: ANTERIOR CERVICAL DECOMPRESSION/DISCECTOMY FUSION CERVICAL THREE-FOUR,CERVICAL FOUR-FIVE,CERVICAL FIVE-SIX;  Surgeon: Floyce Stakes, MD;  Location: [facility];  Service: Neurosurgery;  Laterality: N/A;  . Penile prosthesis implant N/A 07/02/2014    Procedure: PENILE PROTHESIS INFLATABLE 3 PIECE (COLOPLAST) SCROTAL APPROACH;  Surgeon: Kathie Rhodes, MD;  Location: [facility];  Service: Urology;  Laterality: N/A;  . Esophagogastroduodenoscopy (egd) with propofol N/A 09/21/2014    Procedure: ESOPHAGOGASTRODUODENOSCOPY (EGD) WITH PROPOFOL;  Surgeon: Manya Silvas, MD;  Location: [facility];  Service: Endoscopy;  Laterality: N/A;  . Penile prosthesis implant N/A 11/23/2014    Procedure: EXPLORATION AND REVISION OF PENILE PROSTHESIS;  Surgeon: Kathie Rhodes, MD;  Location: [facility];  Service: Urology;  Laterality: N/A;  . Cataract extraction, bilateral    . Tonsillectomy    .  Cardioversion      x 2 attempts-unsuccessful.  . Laparoscopic internal hernia repair N/A 03/08/2015    Procedure: LAPAROSCOPIC INTERNAL HERNIA REPAIR ;  Surgeon: [name], MD;  Location: [facility];  Service: General;  Laterality: N/A;   Family History  Problem Relation Age of Onset  . Uterine cancer Mother     mets  . Breast cancer Mother   . Cancer Mother     cervical  . Emphysema Father   . Alzheimer's disease Sister   . Prostate cancer Brother   . Heart attack Brother   . Colon cancer Brother 34   History  Sexual Activity  . Sexual Activity: Yes    Outpatient Encounter Prescriptions as of 06/05/2015  Medication Sig  . atorvastatin (LIPITOR) 80 MG tablet Take 1 tablet by mouth at  bedtime  . FLUoxetine (PROZAC) 40 MG capsule Take 1 capsule by mouth  daily  . furosemide (LASIX) 20 MG tablet Take 1 to 2 tablets by  mouth daily  . gabapentin (NEURONTIN) 300 MG capsule Take 300 mg by mouth daily.   [name] lisinopril (PRINIVIL,ZESTRIL) 2.5 MG tablet Take 2.5 mg by mouth every morning.   . pantoprazole (PROTONIX) 40 MG tablet TAKE 1 TABLET (40 MG TOTAL) BY MOUTH 2 (TWO) TIMES DAILY.  [name] primidone (MYSOLINE) 50 MG tablet Take 2 tablets (100 mg total) by mouth 2 (two) times daily.  . silodosin (RAPAFLO) 8 MG CAPS capsule Take 8 mg by mouth daily with breakfast.  . warfarin (COUMADIN) 2.5 MG tablet TAKE 1 TO 1.5 TABLETS BY  MOUTH DAILY OR AS DIRECTED  BY COUMADIN CLINIC  . zolpidem (AMBIEN) 10 MG tablet TAKE 1/2-1 TABLET BY MOUTH AT BEDTIME  . [DISCONTINUED] pantoprazole (PROTONIX) 40 MG tablet Take 40 mg by mouth 2 (two) times daily.   No facility-administered encounter medications on file as of 06/05/2015.    Activities of Daily Living In your present state of health, do you have any difficulty performing the following activities: 03/08/2015 03/07/2015  Hearing? N N  Vision? N N  Difficulty concentrating or making decisions? N N  Walking or climbing stairs? N N  Dressing or bathing?  N Y  Doing errands, shopping? N N    Patient Care Team: Jinny Sanders, MD as PCP - Parshall, RD as Dietitian (Bariatrics) Melida Quitter, MD as Consulting  Physician (Otolaryngology) Delrae Rend, MD as Consulting Physician (Endocrinology) Minus Breeding, MD as Consulting Physician (Cardiology) Kayleen Memos, PA-C as Physician Assistant (Gastroenterology) Brand Males, MD as Consulting Physician (Pulmonary Disease) Malena Catholic, MD as Referring Physician Leeroy Cha, MD as Consulting Physician (Neurosurgery) Ludwig Clarks, DO as Consulting Physician (Neurology) Hollice Espy, MD as Consulting Physician (Urology) Albertine Patricia, DPM as Attending Physician (Podiatry) Jola Schmidt, MD as Consulting Physician (Ophthalmology) Melrose Nakayama, MD as Consulting Physician (Orthopedic Surgery)   Assessment:    Hearing Screening Comments: Wears bilateral hearing aids Vision Screening Comments: 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 Weight < 181 lb (82.101 kg)     Target weight is 180 lbs. Starting 06/05/2015, I will limit intake of sweets, bread, and potatoes to 1 serving of each per week.       Fall Risk Fall Risk  06/05/2015 01/23/2014  Falls in the past year? No Yes  Number falls in past yr: - 2 or more   Depression Screen PHQ 2/9 Scores 06/05/2015 01/23/2014  PHQ - 2 Score 0 0    Cognitive Testing MMSE - Mini Mental State Exam 06/05/2015  Orientation to time 5  Orientation to Place 5  Registration 3  Attention/ Calculation 0  Recall 3  Language- name 2 objects 0  Language- repeat 1  Language- follow 3 step command 3  Language- read & follow direction 0  Write a sentence 0  Copy design 0  Total score 20   PLEASE NOTE: A Mini-Cog screen was completed. Maximum score is 20. A value of 0 denotes this part of [place] MMSE was not completed.  Orientation  to Time - Max 5 Orientation to Place - Max 5 Registration - Max 3 Recall - Max 3 Language Repeat - Max 1 Language Follow 3 Step Command - Max 3  Immunization History  Administered Date(s) Administered  . Influenza Whole 11/09/2005, 11/09/2008, 11/10/2010  . Influenza, High Dose Seasonal PF 12/08/2013  . Pneumococcal Conjugate-13 12/22/2013  . Pneumococcal Polysaccharide-23 02/10/2003, 11/09/2008  . Td 02/09/2005   Screening Tests Health Maintenance  Topic Date Due  . ZOSTAVAX  06/04/2016 (Originally 10/20/2001)  . TETANUS/TDAP  06/04/2016 (Originally 02/10/2015)  . HEMOGLOBIN A1C  09/04/2015  . INFLUENZA VACCINE  09/10/2015  . FOOT EXAM  02/24/2016  . OPHTHALMOLOGY EXAM  05/11/2016  . COLONOSCOPY  07/10/2016  . PNA vac Low Risk Adult  Completed      Plan:     I have personally reviewed and addressed the Medicare Annual Wellness questionnaire and have noted the following in the patient's chart:  A. Medical and social history B. Use of alcohol, tobacco or illicit drugs  C. Current medications and supplements D. Functional ability and status E.  Nutritional status F.  Physical activity G. Advance directives H. List of other physicians I.  Hospitalizations, surgeries, and ER visits in previous 12 months J.  [place] to include hearing, vision, cognitive, depression L. Referrals and appointments - none  In addition, I have reviewed and discussed with patient certain preventive protocols, quality metrics, and best practice recommendations. A written personalized care plan for preventive services as well as general preventive health recommendations were provided to patient.  See attached scanned questionnaire for additional information.   Signed,   Lindell Noe, MHA, BS, LPN Health Advisor [id number]

## 2015-06-05 NOTE — Progress Notes (Signed)
Pre visit review using our clinic review tool, if applicable. No additional management support is needed unless otherwise documented below in the visit note. 

## 2015-06-05 NOTE — Patient Instructions (Signed)
[name] , Thank you for taking time to come for your Medicare Wellness Visit. I appreciate your ongoing commitment to your health goals. Please review the following plan we discussed and let me know if I can assist you in the future.   These are the goals we discussed: Goals    . Weight < 181 lb (82.101 kg)     Target weight is 180 lbs. Starting 06/05/2015, I will limit intake of sweets, bread, and potatoes to 1 serving of each per week.        This is a list of the screening recommended for you and due dates:  Health Maintenance  Topic Date Due  . Shingles Vaccine  06/04/2016*  . Tetanus Vaccine  06/04/2016*  . Hemoglobin A1C  09/04/2015  . Flu Shot  09/10/2015  . Complete foot exam   02/24/2016  . Eye exam for diabetics  05/11/2016  . Colon Cancer Screening  07/10/2016  . Pneumonia vaccines  Completed  *Topic was postponed. The date shown is not the original due date.   Preventive Care for Adults  A healthy lifestyle and preventive care can promote health and wellness. Preventive health guidelines for adults include the following key practices.  . A routine yearly physical is a good way to check with your health care provider about your health and preventive screening. It is a chance to share any concerns and updates on your health and to receive a thorough exam.  . Visit your dentist for a routine exam and preventive care every 6 months. Brush your teeth twice a day and floss once a day. Good oral hygiene prevents tooth decay and gum disease.  . The frequency of eye exams is based on your age, health, family medical history, use  of contact lenses, and other factors. Follow your health care provider's ecommendations for frequency of eye exams.  . Eat a healthy diet. Foods like vegetables, fruits, whole grains, low-fat dairy products, and lean protein foods contain the nutrients you need without too many calories. Decrease your intake of foods high in solid fats, added  sugars, and salt. Eat the right amount of calories for you. Get information about a proper diet from your health care provider, if necessary.  . Regular physical exercise is one of the most important things you can do for your health. Most adults should get at least 150 minutes of moderate-intensity exercise (any activity that increases your heart rate and causes you to sweat) each week. In addition, most adults need muscle-strengthening exercises on 2 or more days a week.  Silver Sneakers may be a benefit available to you. To determine eligibility, you may visit the website: www.silversneakers.com or contact program at 210-301-8031 Mon-Fri between 8AM-8PM.   . Maintain a healthy weight. The body mass index (BMI) is a screening tool to identify possible weight problems. It provides an estimate of body fat based on height and weight. Your health care provider can find your BMI and can help you achieve or maintain a healthy weight.   For adults 20 years and older: ? A BMI below 18.5 is considered underweight. ? A BMI of 18.5 to 24.9 is normal. ? A BMI of 25 to 29.9 is considered overweight. ? A BMI of 30 and above is considered obese.   . Maintain normal blood lipids and cholesterol levels by exercising and minimizing your intake of saturated fat. Eat a balanced diet with plenty of fruit and vegetables. Blood tests for lipids and cholesterol  should begin at age 40 and be repeated every 5 years. If your lipid or cholesterol levels are high, you are over 50, or you are at high risk for heart disease, you may need your cholesterol levels checked more frequently. Ongoing high lipid and cholesterol levels should be treated with medicines if diet and exercise are not working.  . If you smoke, find out from your health care provider how to quit. If you do not use tobacco, please do not start.  . If you choose to drink alcohol, please do not consume more than 2 drinks per day. One drink is considered to  be 12 ounces (355 mL) of beer, 5 ounces (148 mL) of wine, or 1.5 ounces (44 mL) of liquor.  . If you are 24-81 years old, ask your health care provider if you should take aspirin to prevent strokes.  . Use sunscreen. Apply sunscreen liberally and repeatedly throughout the day. You should seek shade when your shadow is shorter than you. Protect yourself by wearing long sleeves, pants, a wide-brimmed hat, and sunglasses year round, whenever you are outdoors.  . Once a month, do a whole body skin exam, using a mirror to look at the skin on your back. Tell your health care provider of new moles, moles that have irregular borders, moles that are larger than a pencil eraser, or moles that have changed in shape or color.

## 2015-06-06 ENCOUNTER — Telehealth: Payer: Self-pay | Admitting: Cardiology

## 2015-06-06 ENCOUNTER — Encounter: Payer: Self-pay | Admitting: Cardiology

## 2015-06-06 ENCOUNTER — Ambulatory Visit (INDEPENDENT_AMBULATORY_CARE_PROVIDER_SITE_OTHER): Payer: Medicare Other | Admitting: Cardiology

## 2015-06-06 VITALS — BP 112/64 | HR 64 | Ht 74.0 in | Wt 193.0 lb

## 2015-06-06 DIAGNOSIS — I482 Chronic atrial fibrillation: Secondary | ICD-10-CM

## 2015-06-06 DIAGNOSIS — L570 Actinic keratosis: Secondary | ICD-10-CM | POA: Diagnosis not present

## 2015-06-06 DIAGNOSIS — L304 Erythema intertrigo: Secondary | ICD-10-CM | POA: Diagnosis not present

## 2015-06-06 DIAGNOSIS — Z85828 Personal history of other malignant neoplasm of skin: Secondary | ICD-10-CM | POA: Diagnosis not present

## 2015-06-06 DIAGNOSIS — I4821 Permanent atrial fibrillation: Secondary | ICD-10-CM

## 2015-06-06 NOTE — Telephone Encounter (Signed)
New message ° ° ° ° ° °Talk to Erica °

## 2015-06-06 NOTE — Patient Instructions (Signed)
Your physician wants you to follow-up in: 18 Months. You will receive a reminder letter in the mail two months in advance. If you don't receive a letter, please call our office to schedule the follow-up appointment.  

## 2015-06-06 NOTE — Telephone Encounter (Signed)
Called pt multiple times without success until this evening. At 4pm, spoke with pt's wife & she stated the pt is at his appt with Dr. Percival Spanish & that she would have him to call back if he needed anything.  Advised to have pt call back with any issues & we would gladly assist.

## 2015-06-06 NOTE — Progress Notes (Signed)
HPI The patient presents for followup of atrial fibrillation.  Since I last saw him he has continued to do well following gastric bypass surgery. He denies any chest pressure, neck or arm discomfort. He rarely notices palpitations.  He denies PND or orthopnea.  He has no new dyspnea.  He exercises every day.   Allergies  Allergen Reactions  . Sulfacetamide Sodium     REACTION: Throat swelling  . Adhesive [Tape]     Tears skin-- "No tape of any kind, they all tear skin right off" Tears skin-- "No tape of any kind, they all tear skin right off"    Current Outpatient Prescriptions  Medication Sig Dispense Refill  . atorvastatin (LIPITOR) 80 MG tablet Take 1 tablet by mouth at  bedtime 90 tablet 0  . FLUoxetine (PROZAC) 40 MG capsule Take 1 capsule by mouth  daily 90 capsule 0  . furosemide (LASIX) 20 MG tablet Take 1 to 2 tablets by  mouth daily 180 tablet 0  . gabapentin (NEURONTIN) 300 MG capsule Take 300 mg by mouth daily.     [name] lisinopril (PRINIVIL,ZESTRIL) 2.5 MG tablet Take 2.5 mg by mouth every morning.     . pantoprazole (PROTONIX) 40 MG tablet TAKE 1 TABLET (40 MG TOTAL) BY MOUTH 2 (TWO) TIMES DAILY. 60 tablet 0  . primidone (MYSOLINE) 50 MG tablet Take 2 tablets (100 mg total) by mouth 2 (two) times daily. 360 tablet 1  . ranitidine (ZANTAC) 300 MG tablet Take 1 tablet by mouth at bedtime.    . silodosin (RAPAFLO) 8 MG CAPS capsule Take 8 mg by mouth daily with breakfast.    . warfarin (COUMADIN) 2.5 MG tablet TAKE 1 TO 1.5 TABLETS BY  MOUTH DAILY OR AS DIRECTED  BY COUMADIN CLINIC 75 tablet 1  . zolpidem (AMBIEN) 10 MG tablet TAKE 1/2-1 TABLET BY MOUTH AT BEDTIME 30 tablet 0   No current facility-administered medications for this visit.    Past Medical History  Diagnosis Date  . Diverticulosis of colon (without mention of hemorrhage)   . Restless legs syndrome (RLS)   . Anxiety state, unspecified   . Hx of gout     but doesn't take any meds  . Bruises easily     pt is  on Coumadin  . Cancer ([place])     SKIN CANCER REMOVED  . Depressive disorder, not elsewhere classified     takes Prozac daily  . History of blood clots     behind right knee and then went into left lung 64yrs ago  . Peripheral edema     takes Furosemide daily  . Unspecified asthma(493.90)     as a child  . Pneumonia     last time about 70yrs ago  . Joint pain   . History of colon polyps   . History of kidney stones   . Hard of hearing     wears hearing aids  . Chronic atrial fibrillation (HCC)     a.fib on warfarin- Dr. Percival Spanish follows  . Peripheral vascular disease ([place])   . Pulmonary embolism ([place])     over 10 yrs ago "many years ago"  . ED (erectile dysfunction)   . Hx of diabetes mellitus     "no longer diabetic since gastric bypass" - no longer taking metformin"  in 2 months "problems with low blood sugar now"  . Unspecified essential hypertension     hx of no longer on medication due to gastric bypass   .  GERD (gastroesophageal reflux disease)   . Arthritis     hands   . Inflammation of colonic mucosa     recent admission and release from [facility] 02-28-15-remains on oral antibiotic    Past Surgical History  Procedure Laterality Date  . Knee surgery Right     x 4  . Artery repair      Left forearm  . Foot surgery      Left foot   . Replacement total knee Left     x 2  . Breath tek h pylori  01/08/2011    Procedure: BREATH TEK H PYLORI;  Surgeon: Pedro Earls, MD;  Location: [facility];  Service: General;  Laterality: N/A;  . Hand surgery      LEFT  . Leg surgery      FOR NECROTIZING FASCITIS L LEG AND GROIN  . 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 Shoulder arthroscopy    . Shoulder surgery Left 2014  . Back surgery      x 3  . Knee surgery Left     arthroscopy  . Eye surgery      cataract bil  .  Injections in back      x 18  . Colonoscopy    . Cystoscopy    . Total shoulder arthroplasty Left 01/26/2013    Procedure: TOTAL SHOULDER ARTHROPLASTY;  Surgeon: Nita Sells, MD;  Location: [facility];  Service: Orthopedics;  Laterality: Left;  Left total shoulder arthroplasty  . Anterior cervical decomp/discectomy fusion N/A 05/09/2013    Procedure: ANTERIOR CERVICAL DECOMPRESSION/DISCECTOMY FUSION CERVICAL THREE-FOUR,CERVICAL FOUR-FIVE,CERVICAL FIVE-SIX;  Surgeon: Floyce Stakes, MD;  Location: [facility];  Service: Neurosurgery;  Laterality: N/A;  . Penile prosthesis implant N/A 07/02/2014    Procedure: PENILE PROTHESIS INFLATABLE 3 PIECE (COLOPLAST) SCROTAL APPROACH;  Surgeon: Kathie Rhodes, MD;  Location: [facility];  Service: Urology;  Laterality: N/A;  . Esophagogastroduodenoscopy (egd) with propofol N/A 09/21/2014    Procedure: ESOPHAGOGASTRODUODENOSCOPY (EGD) WITH PROPOFOL;  Surgeon: Manya Silvas, MD;  Location: [facility];  Service: Endoscopy;  Laterality: N/A;  . Penile prosthesis implant N/A 11/23/2014    Procedure: EXPLORATION AND REVISION OF PENILE PROSTHESIS;  Surgeon: Kathie Rhodes, MD;  Location: [facility];  Service: Urology;  Laterality: N/A;  . Cataract extraction, bilateral    . Tonsillectomy    . Cardioversion      x 2 attempts-unsuccessful.  . Laparoscopic internal hernia repair N/A 03/08/2015    Procedure: LAPAROSCOPIC INTERNAL HERNIA REPAIR ;  Surgeon: [name], MD;  Location: [facility];  Service: General;  Laterality: N/A;    ROS:  ED. Otherwise as stated in the HPI and negative for all other systems.  PHYSICAL EXAM BP 112/64 mmHg  Pulse 64  Ht 6\' 2"  (1.88 m)  Wt 193 lb (87.544 kg)  BMI 24.77 kg/m2 GENERAL:  Well appearing HEENT:  Pupils equal round and reactive, fundi not visualized, oral mucosa unremarkable, dentures NECK:  No jugular venous distention, waveform within normal limits, carotid upstroke brisk and symmetric, no bruits, no  thyromegaly LUNGS:  Clear to auscultation bilaterally BACK:  No CVA tenderness CHEST:  Unremarkable HEART:  PMI not displaced or sustained,S1 and S2 within normal limits, no S3, no clicks, no rubs, no murmurs,  irregular ABD:  Flat, positive bowel sounds normal in frequency in  pitch, no bruits, no rebound, no guarding, no midline pulsatile mass, no hepatomegaly, no splenomegaly, , well healed surgical scores EXT:  2 plus pulses throughout, no edema, no cyanosis no clubbing SKIN:  No rashes no nodules, bruising  EKG:  Atrial fibrillation, rate 64, axis within normal limits, intervals within normal limits, no acute ST-T wave changes. 06/06/2015  ASSESSMENT AND PLAN  ATRIAL FIBRILLATION, PAROXYSMAL He tolerates this rhythm with rate control and anticoagulation. No further testing is indicated. He will remain on warfarin.   HYPERTENSION  The blood pressure is at target. No change in medications is indicated. We will continue with therapeutic lifestyle changes (TLC).  OBESITY He is now about 130.   No change in therapy is indicated.

## 2015-06-07 ENCOUNTER — Other Ambulatory Visit: Payer: Medicare Other

## 2015-06-07 ENCOUNTER — Ambulatory Visit: Payer: Medicare Other

## 2015-06-13 ENCOUNTER — Ambulatory Visit (INDEPENDENT_AMBULATORY_CARE_PROVIDER_SITE_OTHER): Payer: Medicare Other | Admitting: Family Medicine

## 2015-06-13 ENCOUNTER — Encounter: Payer: Self-pay | Admitting: Family Medicine

## 2015-06-13 VITALS — BP 111/70 | HR 75 | Temp 98.1°F | Ht 74.0 in | Wt 184.2 lb

## 2015-06-13 DIAGNOSIS — I10 Essential (primary) hypertension: Secondary | ICD-10-CM | POA: Diagnosis not present

## 2015-06-13 DIAGNOSIS — I482 Chronic atrial fibrillation: Secondary | ICD-10-CM

## 2015-06-13 DIAGNOSIS — J449 Chronic obstructive pulmonary disease, unspecified: Secondary | ICD-10-CM

## 2015-06-13 DIAGNOSIS — F330 Major depressive disorder, recurrent, mild: Secondary | ICD-10-CM | POA: Diagnosis not present

## 2015-06-13 DIAGNOSIS — E1142 Type 2 diabetes mellitus with diabetic polyneuropathy: Secondary | ICD-10-CM | POA: Diagnosis not present

## 2015-06-13 DIAGNOSIS — E7800 Pure hypercholesterolemia, unspecified: Secondary | ICD-10-CM

## 2015-06-13 DIAGNOSIS — I4821 Permanent atrial fibrillation: Secondary | ICD-10-CM

## 2015-06-13 DIAGNOSIS — G250 Essential tremor: Secondary | ICD-10-CM

## 2015-06-13 DIAGNOSIS — E1143 Type 2 diabetes mellitus with diabetic autonomic (poly)neuropathy: Secondary | ICD-10-CM | POA: Diagnosis not present

## 2015-06-13 LAB — HM DIABETES FOOT EXAM

## 2015-06-13 NOTE — Assessment & Plan Note (Signed)
Stable control on fluoxetine, pt does not wish to wean off at this time.

## 2015-06-13 NOTE — Assessment & Plan Note (Signed)
Followed by cardiology on coumadin for anticoagulation.  Euvolemic on lasix.

## 2015-06-13 NOTE — Progress Notes (Signed)
75 year old male presents for medicare wellness PART 2.  Prevention was reviewed by Sharrell Ku on 06/05/2015  Note reviewed in detail.   Elevated Cholesterol: LDL at goal < 70 on lipitor 20 mg daily . Lab Results  Component Value Date   CHOL 126 06/05/2015   HDL 55.30 06/05/2015   LDLCALC 56 06/05/2015   LDLDIRECT 147.0 03/07/2012   TRIG 70.0 06/05/2015   CHOLHDL 2 06/05/2015  Diet compliance:Great S/P gatric bypass,    Wt Readings from Last 3 Encounters:  06/13/15 184 lb 4 oz (83.575 kg)  06/06/15 193 lb (87.544 kg)  06/05/15 192 lb 8 oz (87.317 kg)  Body mass index is 23.65 kg/(m^2).  Diabetes: well controlled on no med. Lab Results  Component Value Date   HGBA1C 6.8* 06/05/2015  Using medications without difficulties:on no.  Hypoglycemic episodes:None  Hyperglycemic episodes:None  Feet problems:none, mild burning in feet well controlled on gabapentin. Blood Sugars averaging:2 hour postprandial 130-140 eye exam within last year: yes  Hypertension: Well controlled on lisinopril, toprol xl, lasix  BP Readings from Last 3 Encounters:  06/13/15 111/70  06/06/15 112/64  06/05/15 124/80  Using medication without problems or lightheadedness: None  Chest pain with exertion:None  Edema:None  Short of breath:None  Average home ST:[id number]  Other issues:Afib on coumadin   Depression, well controlled on no med.   AFib followed by Dr. Percival Spanish. Last OV reviewed from 06/06/2015.No changes made. On coumadin.  Tremor (markedly better on low-dose primidone) and peripheral neuropathy causing gait instability: followed by Dr. Carles Collet, neuro. Reviewed OV 03/2015.  Exercise: daily on treadmill 2.5 miles  Diet; limited but healthy  Depression, anxiety: well controlled on fluoxetine.  Mild persitent asthma/mild COPD: well controlled. No daily cough, no nighttime cough. No SOB.  Social History /Family History/Past Medical History reviewed and updated if  needed.   Review of Systems  Constitutional: Negative for fever and fatigue.  HENT: Negative for ear pain.  Respiratory: Negative for wheezing.  Cardiovascular: Negative for chest pain.  Gastrointestinal: Negative for abdominal pain.  Skin: Negative for rash.    Objective:   Physical Exam  Constitutional: Vital signs are normal. He appears well-developed and well-nourished.  HENT:  Head: Normocephalic.  Right Ear: Hearing normal.  Left Ear: Hearing normal.  Nose: Nose normal.  Mouth/Throat: Oropharynx is clear and moist and mucous membranes are normal.  Neck: Trachea normal. Carotid bruit is not present. No mass and no thyromegaly present.  Cardiovascular: Normal rate and normal pulses. An irregular rhythm present. Exam reveals distant heart sounds. Exam reveals no gallop and no friction rub.  No murmur heard. No peripheral edema  Pulmonary/Chest: Effort normal and breath sounds normal. No respiratory distress.  Skin: Skin is warm, dry and intact. No rash noted.  Psychiatric: He has a normal mood and affect. His speech is normal and behavior is normal. Thought content normal.   Diabetic foot exam: Normal inspection No skin breakdown No calluses  Normal DP pulses Normal sensation to light touch and monofilament Nails normal    ASSESSMENT and PLAN: The patient's preventative maintenance and recommended screening tests for an annual wellness exam were reviewed in full today. Brought up to date unless services declined.  Counselled on the importance of diet, exercise, and its role in overall health and mortality. The patient's FH and SH was reviewed, including their home life, tobacco status, and drug and alcohol status.     Uptodate except shingles. Colon: 2008, repeat in 10 years, Dr. Sharlett Iles.  Prostate: no indicated given age.

## 2015-06-13 NOTE — Assessment & Plan Note (Signed)
Improved control on primidone , followed by neuro.

## 2015-06-13 NOTE — Assessment & Plan Note (Signed)
Well controlled on no medication following weight loss from gastric bypass.

## 2015-06-13 NOTE — Assessment & Plan Note (Signed)
LDL at goal < 70 on lipitor 20 mg daily.

## 2015-06-13 NOTE — Patient Instructions (Addendum)
Make sure to get shingles vaccine.  Keep up great work on healthy eating and regular exercise.

## 2015-06-13 NOTE — Assessment & Plan Note (Signed)
Well controlled. Continue current medication.  

## 2015-06-13 NOTE — Assessment & Plan Note (Signed)
Well controlled on gabapentin

## 2015-06-13 NOTE — Progress Notes (Signed)
Pre visit review using our clinic review tool, if applicable. No additional management support is needed unless otherwise documented below in the visit note. 

## 2015-06-13 NOTE — Assessment & Plan Note (Signed)
No SOB or cough on no medication.

## 2015-06-18 ENCOUNTER — Other Ambulatory Visit: Payer: Self-pay | Admitting: Family Medicine

## 2015-06-18 NOTE — Telephone Encounter (Signed)
Last office visit 06/13/2015.  Last refilled 05/20/2015 for #30 with no refills.  Ok to refill?

## 2015-06-20 NOTE — Telephone Encounter (Signed)
Zolpidem called into [facility].

## 2015-06-21 ENCOUNTER — Telehealth: Payer: Self-pay

## 2015-06-21 NOTE — Telephone Encounter (Signed)
[name] with [facility] left v/m pt received Zostavax 06/21/15.

## 2015-06-24 DIAGNOSIS — M19041 Primary osteoarthritis, right hand: Secondary | ICD-10-CM | POA: Diagnosis not present

## 2015-07-01 ENCOUNTER — Other Ambulatory Visit: Payer: Self-pay | Admitting: Unknown Physician Specialty

## 2015-07-01 DIAGNOSIS — K219 Gastro-esophageal reflux disease without esophagitis: Secondary | ICD-10-CM

## 2015-07-03 DIAGNOSIS — M79642 Pain in left hand: Secondary | ICD-10-CM | POA: Diagnosis not present

## 2015-07-03 DIAGNOSIS — E1142 Type 2 diabetes mellitus with diabetic polyneuropathy: Secondary | ICD-10-CM | POA: Diagnosis not present

## 2015-07-03 DIAGNOSIS — Z5181 Encounter for therapeutic drug level monitoring: Secondary | ICD-10-CM | POA: Diagnosis not present

## 2015-07-03 DIAGNOSIS — Z9884 Bariatric surgery status: Secondary | ICD-10-CM | POA: Diagnosis not present

## 2015-07-03 DIAGNOSIS — M79641 Pain in right hand: Secondary | ICD-10-CM | POA: Diagnosis not present

## 2015-07-04 ENCOUNTER — Ambulatory Visit
Admission: RE | Admit: 2015-07-04 | Discharge: 2015-07-04 | Disposition: A | Discharge status: Home / Self Care | Payer: Medicare Other | Source: Ambulatory Visit | Attending: Unknown Physician Specialty | Admitting: Unknown Physician Specialty

## 2015-07-04 DIAGNOSIS — Z9884 Bariatric surgery status: Secondary | ICD-10-CM | POA: Insufficient documentation

## 2015-07-04 DIAGNOSIS — K219 Gastro-esophageal reflux disease without esophagitis: Secondary | ICD-10-CM | POA: Diagnosis not present

## 2015-07-08 DIAGNOSIS — J22 Unspecified acute lower respiratory infection: Secondary | ICD-10-CM | POA: Diagnosis not present

## 2015-07-08 DIAGNOSIS — R0781 Pleurodynia: Secondary | ICD-10-CM | POA: Diagnosis not present

## 2015-07-10 ENCOUNTER — Ambulatory Visit (INDEPENDENT_AMBULATORY_CARE_PROVIDER_SITE_OTHER): Payer: Medicare Other

## 2015-07-10 DIAGNOSIS — Z5181 Encounter for therapeutic drug level monitoring: Secondary | ICD-10-CM | POA: Diagnosis not present

## 2015-07-10 DIAGNOSIS — I4891 Unspecified atrial fibrillation: Secondary | ICD-10-CM | POA: Diagnosis not present

## 2015-07-10 DIAGNOSIS — I824Y9 Acute embolism and thrombosis of unspecified deep veins of unspecified proximal lower extremity: Secondary | ICD-10-CM

## 2015-07-10 DIAGNOSIS — Z7901 Long term (current) use of anticoagulants: Secondary | ICD-10-CM | POA: Diagnosis not present

## 2015-07-10 LAB — POCT INR: INR: 2.5

## 2015-07-24 ENCOUNTER — Other Ambulatory Visit: Payer: Self-pay | Admitting: Family Medicine

## 2015-07-24 NOTE — Telephone Encounter (Signed)
Last office visit 06/13/2015.  Last refilled 06/20/2015 for #30 with no refills.  Ok to refill?

## 2015-07-25 NOTE — Telephone Encounter (Signed)
Zolpidem called into [facility].

## 2015-07-29 ENCOUNTER — Telehealth: Payer: Self-pay | Admitting: *Deleted

## 2015-07-29 NOTE — Telephone Encounter (Addendum)
Patient's wife left a voicemail stating that patient is taking Ambien 10 mg and wants to increase it to 15 mg because he is not staying a sleep. Patient's wife stated that they would like to break it and maybe he can take 2/3 of it because insurance will not cover and they would last longer.

## 2015-07-29 NOTE — Telephone Encounter (Signed)
I am not comfortable advising this. 15 mg is above recommended dose for any age - and he is 75 yo.  Non-emergent, and I think can wait from input from Dr. Jacinto Reap. Generic Lunesta might be an option.

## 2015-07-30 MED ORDER — TRAZODONE HCL 50 MG PO TABS
25.0000 mg | ORAL_TABLET | Freq: Every evening | ORAL | Status: DC | PRN
Start: 2015-07-30 — End: 2016-08-18

## 2015-07-30 NOTE — Telephone Encounter (Signed)
[name] said pt has never tried med for sleep except generic Lorrin Mais and would prefer trying a different med instead of making appt at this time. Request new med be generic. [facility]. [name] request cb when done.

## 2015-07-30 NOTE — Telephone Encounter (Signed)
Left message for [name] to return my call.

## 2015-07-30 NOTE — Telephone Encounter (Signed)
15 mg is not a safe dose. We can try a different medication...  Call to find out..what meds has he used in the past? Or make an appointment to discuss options in detail if he prefers.

## 2015-07-30 NOTE — Telephone Encounter (Signed)
Sent in trazodone.. Let pt know this does not make him fall asleep as quickly as Ambien but will work well. If 50 mg not effective after 20-3 days can try 100 mg at bedtime.

## 2015-07-31 NOTE — Telephone Encounter (Signed)
Vanessa notified as instructed by telephone. 

## 2015-08-06 MED ORDER — ZALEPLON 5 MG PO CAPS: 5.0000 mg | ORAL_CAPSULE | Freq: Every evening | ORAL | Status: DC | PRN

## 2015-08-06 NOTE — Addendum Note (Signed)
Addended byEliezer Lofts E on: 08/06/2015 04:10 PM   Modules accepted: Orders

## 2015-08-06 NOTE — Telephone Encounter (Signed)
Start sonata 10 mg.. Call in rx for trial.

## 2015-08-06 NOTE — Telephone Encounter (Signed)
Left message for [name] that a new medication called [employer] has been called into his pharmacy.  He can take 1 to 2 capsules at bedtime as needed.

## 2015-08-06 NOTE — Telephone Encounter (Signed)
Pt left v/m; pt said the new sleeping pill (pt not sure name of pill) ? 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 Pt request cb. Pt wants to restart ambien or take different med.Please advise.

## 2015-08-07 ENCOUNTER — Other Ambulatory Visit: Payer: Self-pay | Admitting: *Deleted

## 2015-08-07 NOTE — Telephone Encounter (Signed)
[facility] Zoleplon 5 mg.  PA completed on CoverMyMeds.  Awaiting decision.

## 2015-08-08 MED ORDER — RAMELTEON 8 MG PO TABS: 8.0000 mg | ORAL_TABLET | Freq: Every day | ORAL | Status: DC

## 2015-08-08 NOTE — Telephone Encounter (Addendum)
Rozerem called into [facility].  Left message for [name] that a new sleeping medication has been called into his pharmacy.

## 2015-08-08 NOTE — Telephone Encounter (Signed)
Received fax from [facility].  Patient must try Belsomra or Rozerem (formulary medication).  Ok to change?

## 2015-08-08 NOTE — Addendum Note (Signed)
Addended by: Carter Kitten on: 08/08/2015 08:54 AM   Modules accepted: Orders

## 2015-08-09 NOTE — Addendum Note (Signed)
Addended by: Carter Kitten on: 08/09/2015 12:47 PM   Modules accepted: Orders, Medications

## 2015-08-19 DIAGNOSIS — L304 Erythema intertrigo: Secondary | ICD-10-CM | POA: Diagnosis not present

## 2015-08-19 DIAGNOSIS — L821 Other seborrheic keratosis: Secondary | ICD-10-CM | POA: Diagnosis not present

## 2015-08-19 DIAGNOSIS — Z85828 Personal history of other malignant neoplasm of skin: Secondary | ICD-10-CM | POA: Diagnosis not present

## 2015-08-19 DIAGNOSIS — L570 Actinic keratosis: Secondary | ICD-10-CM | POA: Diagnosis not present

## 2015-08-21 ENCOUNTER — Ambulatory Visit (INDEPENDENT_AMBULATORY_CARE_PROVIDER_SITE_OTHER): Payer: Medicare Other | Admitting: *Deleted

## 2015-08-21 DIAGNOSIS — I824Y9 Acute embolism and thrombosis of unspecified deep veins of unspecified proximal lower extremity: Secondary | ICD-10-CM

## 2015-08-21 DIAGNOSIS — I4891 Unspecified atrial fibrillation: Secondary | ICD-10-CM | POA: Diagnosis not present

## 2015-08-21 DIAGNOSIS — M25561 Pain in right knee: Secondary | ICD-10-CM | POA: Diagnosis not present

## 2015-08-21 DIAGNOSIS — Z5181 Encounter for therapeutic drug level monitoring: Secondary | ICD-10-CM | POA: Diagnosis not present

## 2015-08-21 DIAGNOSIS — Z7901 Long term (current) use of anticoagulants: Secondary | ICD-10-CM

## 2015-08-21 LAB — POCT INR: INR: 2.7

## 2015-08-22 MED ORDER — ZOLPIDEM TARTRATE 10 MG PO TABS: 10.0000 mg | ORAL_TABLET | Freq: Every evening | ORAL | Status: DC | PRN

## 2015-08-22 NOTE — Addendum Note (Signed)
Addended by: Eliezer Lofts E on: 08/22/2015 01:20 PM   Modules accepted: Orders, Medications

## 2015-08-22 NOTE — Telephone Encounter (Addendum)
Mrs Urben [facility] signed) left v/m; the last 2 meds for sleep cause pt to be sleepy all day long; pt prefers to go back to ambien;when pt takes [place] pt gets at least 4 hours of sleep; Mrs Raza said when [place] refill needed will contact office.FYI to Dr Diona Browner.[facility].

## 2015-08-23 ENCOUNTER — Other Ambulatory Visit: Payer: Self-pay | Admitting: Family Medicine

## 2015-08-23 ENCOUNTER — Other Ambulatory Visit: Payer: Self-pay | Admitting: Pharmacist Clinician (PhC)/ Clinical Pharmacy Specialist

## 2015-08-23 MED ORDER — WARFARIN SODIUM 2.5 MG PO TABS: ORAL_TABLET | ORAL | Status: DC

## 2015-08-23 NOTE — Telephone Encounter (Signed)
Last f/u 06/2015-CPE 

## 2015-08-26 NOTE — Telephone Encounter (Signed)
Printed Rx for Ambien 10 mg, dated 08/22/15, was in my in box on 08/26/2015 when I returned from vacation.  [facility] [place] at [phone].  I do not see a refill request from 08/22/2015 to link this note to.

## 2015-09-15 ENCOUNTER — Other Ambulatory Visit: Payer: Self-pay | Admitting: Neurology

## 2015-09-23 DIAGNOSIS — H43813 Vitreous degeneration, bilateral: Secondary | ICD-10-CM | POA: Diagnosis not present

## 2015-09-23 DIAGNOSIS — H04123 Dry eye syndrome of bilateral lacrimal glands: Secondary | ICD-10-CM | POA: Diagnosis not present

## 2015-09-23 DIAGNOSIS — D2312 Other benign neoplasm of skin of left eyelid, including canthus: Secondary | ICD-10-CM | POA: Diagnosis not present

## 2015-09-23 LAB — HM DIABETES EYE EXAM

## 2015-09-25 ENCOUNTER — Other Ambulatory Visit: Payer: Self-pay | Admitting: Family Medicine

## 2015-09-26 NOTE — Progress Notes (Signed)
Subjective:    [name] was seen in consultation in the movement disorder clinic at the request of Dr. Joya Salm.  His PCP is Eliezer Lofts, MD.  This patient is accompanied in the office by his child who supplements the history. The evaluation is for tremor.  I reviewed prior records that are available to me.  Looking back in his medical record, there are notes back to 2012 that indicate severe essential tremor.  Pt reports tremor has been worse since recent surgeries.  He had a shoulder surgery for bone spurs first and then had a shoulder replacement on the L and then had a cervical fusion surgery.  Prior to that, he had gastric bypass in 2013.    The patient is a 75 y.o. right handed male with a history of tremor.  Pt reports that tremor started after an episode of sepsis about 5 years ago.  Once he left the hospital, it was in the bilateral UE, overall slight but it has gotten worse over 8 months.  He cannot eat peas or the roll off the spoon.   There is no family hx of tremor.    Affected by caffeine:  No. Affected by alcohol: doesn't drink Affected by stress:  No. Affected by fatigue:  No. Spills soup if on spoon:  Yes.   Spills glass of liquid if full:  No. Affects ADL's (tying shoes, brushing teeth, etc):  No.  Pt had neck hematomas after surgery, which caused dysphagia.  A modified barium swallow was done on 08/14/2013 indicate severe cervical face dysphagia, moderate to severe pharyngeal phase dysphagia.  There is recommended if crush his medications or takes them in liquid form.  Outpatient speech language pathology and nutrition consult was also recommended.  11/01/13 update:  Pt is returning for f/u.  Started primidone last visit.   Pt states that it has been helpful but thinks that we could go up on the medication.  No SE.  Had carpal tunnel release on the L.  States that it didn't help.  Has also gone to speech therapy for swallowing, as recommended and that did not  help.  01/02/14 update:  The patient returns for follow-up today.  He has a history of essential tremor.  Last visit, his primidone was increased 50 mg twice a day.  "I think that if you increased it one more time, we will have it just right."  I got a note from his ENT physician at [facility].  They agreed that dysphagia was related to prior cervical spine surgery.  They told him that loss of balance is not due to an inner ear pathology and he was told to follow-up here.  He does have a history of diabetic peripheral neuropathy.  Pt states that balance has been off since the neck surgery and thinks that it is related.  He states that it actually has gotten slowly better.  Balance isn't great but he isn't falling anymore either which used to be a big problem.  05/02/14 update:  His primidone was increased last visit so that he is taking 100 mg in the morning and he remains on 50 mg at night.  The patient states that tremor has tremor is doing well.  He states that tremor is well controlled.  He only spills something or notices tremor 1-2 days a month, which is markedly improved.  No SE with the tremor.    11/02/14 update:  The patient has a history of essential tremor  and is on primidone, 100 mg in the morning and 50 mg at night. He thinks that tremor is well controlled.   I reviewed records since our last visit.  He has undergone a rather extensive workup for epigastric pain and weight loss.  He is a CT of the abdomen that was negative.  His H. pylori antibodies were negative.  He had a stress Myoview and while there was some evidence of ischemia, the patient did not believe that this was cardiac related and did not want to proceed with a cath.  He has seen gastroenterology and has had an EGD.  He was started on carafate and he thinks that it is helping.  He thinks that much of it is from the gastric bypass as he has emesis after the meals.    11/27/14 update:  Pt is f/u much earlier than expected.  On  primidone, 100 mg in AM, 50 mg at night.  The records that were made available to me were reviewed since last visit.  Had to have penile prosthesis removed that malfunctioned and insert a new one. Also, had surgery for his tear ducts yesterday. His wife states that Sunday AM he was getting out of the shower and he screamed and he felt like he was getting stabbed in the back of the head.  He's had this in the past but not this severe.  It is sore to the touch behind the ear.  It was awful for 15 min (felt like on fire) but if he turns his head to the right, he still feels it.    03/22/15 update:  The patient follows up today regarding his essential tremor.  He is on primidone, 100 mg in the morning and 50 g at night.  Tremor has slightly increased with time.   Last visit, which was in October, we did an occipital nerve block for right occipital neuralgia.  This was done on 11/28/2014.  He states that it did not help and he has been having a daily headache.  He continues to have a posterior occipital headache that radiates to the frontal region but he points to the left side today.  He states that the fingers on the L thumb, pointer and middle finger are numb.  States had Lcarpal tunnel surgery a year ago but these sx's started after shoulder replacement surgery and is convinced that something happened; no neck pain but has had prior neck surgery.  The shoulder replacement surgery was done first and the CTS was done a month later.     I did review records since our last visit.  He underwent surgery for an internal hernia repair on 03/08/2015.  09/30/15 update:  The patient follows up today.  He has a history of essential tremor and last visit I slightly increased his primidone to that he is taking 100 mg both in the morning and at night.  He states that he still has tremor but that is fairly stable and not worse.  He was complaining about left-sided hand paresthesias last visit and we ended up doing an EMG on file  14th, 2017.  This was normal, without evidence of focal neuropathy or radiculopathy.  C/o pain in back of the head that will feel like he is getting hit in the head on either and then will go away within a few seconds.  Has had a few times in the last few months but always keeps a "low grade headache."   No hx  of stomach ulcer.  No diabetic meds x many months.  He is unsure why he takes the gabapentin but thinks it may be for diabetic neuropathy and gouty pain.    Current/Previously tried tremor medications: no  Current medications that may exacerbate tremor:  n/a  Outside reports reviewed: historical medical records, lab reports and referral letter/letters.  Allergies  Allergen Reactions  . Sulfacetamide Sodium     REACTION: Throat swelling  . Adhesive [Tape]     Tears skin-- "No tape of any kind, they all tear skin right off" Tears skin-- "No tape of any kind, they all tear skin right off"    Current Outpatient Prescriptions on File Prior to Visit  Medication Sig Dispense Refill  . atorvastatin (LIPITOR) 80 MG tablet Take 1 tablet by mouth at  bedtime 90 tablet 3  . doxycycline (VIBRAMYCIN) 100 MG capsule Take 100 mg by mouth 2 (two) times daily.     [name] FLUoxetine (PROZAC) 40 MG capsule Take 1 capsule by mouth  daily 90 capsule 3  . furosemide (LASIX) 20 MG tablet Take 1 to 2 tablets by  mouth daily 180 tablet 1  . gabapentin (NEURONTIN) 300 MG capsule Take 300 mg by mouth daily.     [name] lisinopril (PRINIVIL,ZESTRIL) 2.5 MG tablet Take 2.5 mg by mouth every morning.     [name] omeprazole (PRILOSEC) 40 MG capsule Take 1 capsule by mouth  daily 90 capsule 3  . pantoprazole (PROTONIX) 40 MG tablet TAKE 1 TABLET (40 MG TOTAL) BY MOUTH 2 (TWO) TIMES DAILY. 60 tablet 0  . primidone (MYSOLINE) 50 MG tablet TAKE 2 TABLETS (100 MG TOTAL) BY MOUTH 2 (TWO) TIMES DAILY. 120 tablet 0  . ramelteon (ROZEREM) 8 MG tablet Take 1 tablet (8 mg total) by mouth at bedtime. 30 tablet 0  . ranitidine (ZANTAC) 300 MG  tablet Take 1 tablet by mouth at bedtime.    . silodosin (RAPAFLO) 8 MG CAPS capsule Take 8 mg by mouth daily with breakfast.    . traZODone (DESYREL) 50 MG tablet Take 0.5-1 tablets (25-50 mg total) by mouth at bedtime as needed for sleep. 30 tablet 3  . warfarin (COUMADIN) 2.5 MG tablet TAKE 1 TO 1.5 TABLETS BY  MOUTH DAILY OR AS DIRECTED  BY COUMADIN CLINIC 135 tablet 1  . zolpidem (AMBIEN) 10 MG tablet Take 1 tablet (10 mg total) by mouth at bedtime as needed for sleep. 30 tablet 1   No current facility-administered medications on file prior to visit.     Past Medical History:  Diagnosis Date  . Anxiety state, unspecified   . Arthritis    hands   . Bruises easily    pt is on Coumadin  . Cancer ([place])    SKIN CANCER REMOVED  . Chronic atrial fibrillation (HCC)    a.fib on warfarin- Dr. Percival Spanish follows  . Depressive disorder, not elsewhere classified    takes Prozac daily  . Diverticulosis of colon (without mention of hemorrhage)   . ED (erectile dysfunction)   . GERD (gastroesophageal reflux disease)   . Hard of hearing    wears hearing aids  . History of blood clots    behind right knee and then went into left lung 71yrs ago  . History of colon polyps   . History of kidney stones   . Hx of diabetes mellitus    "no longer diabetic since gastric bypass" - no longer taking metformin"  in 2 months "problems with low  blood sugar now"  . Hx of gout    but doesn't take any meds  . Inflammation of colonic mucosa    recent admission and release from [facility] 02-28-15-remains on oral antibiotic  . Joint pain   . Peripheral edema    takes Furosemide daily  . Peripheral vascular disease ([place])   . Pneumonia    last time about 55yrs ago  . Pulmonary embolism ([place])    over 10 yrs ago "many years ago"  . Restless legs syndrome (RLS)   . Unspecified asthma(493.90)    as a child  . Unspecified essential hypertension    hx of no longer on medication due to gastric bypass     Past  Surgical History:  Procedure Laterality Date  . ANTERIOR CERVICAL DECOMP/DISCECTOMY FUSION N/A 05/09/2013   Procedure: ANTERIOR CERVICAL DECOMPRESSION/DISCECTOMY FUSION CERVICAL THREE-FOUR,CERVICAL FOUR-FIVE,CERVICAL FIVE-SIX;  Surgeon: Floyce Stakes, MD;  Location: [facility];  Service: Neurosurgery;  Laterality: N/A;  . ARTERY REPAIR     Left forearm  . BACK SURGERY     x 3  . BREATH TEK H PYLORI  01/08/2011   Procedure: BREATH TEK H PYLORI;  Surgeon: Pedro Earls, MD;  Location: [facility];  Service: General;  Laterality: N/A;  . CARDIOVERSION     x 2 attempts-unsuccessful.  [name] CATARACT EXTRACTION, BILATERAL    . COLONOSCOPY    . CYSTOSCOPY    . ESOPHAGOGASTRODUODENOSCOPY (EGD) WITH PROPOFOL N/A 09/21/2014   Procedure: ESOPHAGOGASTRODUODENOSCOPY (EGD) WITH PROPOFOL;  Surgeon: Manya Silvas, MD;  Location: [facility];  Service: Endoscopy;  Laterality: N/A;  . EYE SURGERY     cataract bil  . FOOT SURGERY     Left foot   . GASTRIC ROUX-EN-Y  08/11/2011   Procedure: LAPAROSCOPIC ROUX-EN-Y GASTRIC BYPASS WITH UPPER ENDOSCOPY;  Surgeon: Pedro Earls, MD;  Location: [facility];  Service: General;  Laterality: N/A;  . HAND SURGERY     LEFT  . injections in back     x 18  . KNEE SURGERY Right    x 4  . KNEE SURGERY Left    arthroscopy  . LAPAROSCOPIC INTERNAL HERNIA REPAIR N/A 03/08/2015   Procedure: LAPAROSCOPIC INTERNAL HERNIA REPAIR ;  Surgeon: [name], MD;  Location: [facility];  Service: General;  Laterality: N/A;  . LEG SURGERY     FOR NECROTIZING FASCITIS L LEG AND GROIN  . PENILE PROSTHESIS IMPLANT N/A 07/02/2014   Procedure: PENILE PROTHESIS INFLATABLE 3 PIECE (COLOPLAST) SCROTAL APPROACH;  Surgeon: Kathie Rhodes, MD;  Location: [facility];  Service: Urology;  Laterality: N/A;  . PENILE PROSTHESIS IMPLANT N/A 11/23/2014   Procedure: EXPLORATION AND REVISION OF PENILE PROSTHESIS;  Surgeon: Kathie Rhodes, MD;  Location: [facility];  Service: Urology;  Laterality: N/A;  .  REPLACEMENT TOTAL KNEE Left    x 2  . SHOULDER ARTHROSCOPY    . SHOULDER SURGERY Left 2014  . TONSILLECTOMY    . TOTAL SHOULDER ARTHROPLASTY Left 01/26/2013   Procedure: TOTAL SHOULDER ARTHROPLASTY;  Surgeon: Nita Sells, MD;  Location: [facility];  Service: Orthopedics;  Laterality: Left;  Left total shoulder arthroplasty    Social History   Social History  . Marital status: Married    Spouse name: N/A  . Number of children: 1  . Years of education: N/A   Occupational History  . Retired     Administrator   Social History Main Topics  . Smoking status: Never Smoker  . Smokeless  tobacco: Never Used  . Alcohol use No  . Drug use: No  . Sexual activity: Yes   Other Topics Concern  . Not on file   Social History Narrative   DIET: 3 meals, F&V, some water, crystal light, No fast food   Exercise: walks treadmill 30 min      1 Caffeine drinks daily       No living will.           Family Status  Relation Status  . Mother Deceased   breast/uterine cancer  . Father Deceased   emphysema, heart disease  . Sister Deceased   alzheimer's disease  . Brother Deceased   struck by lightening  . Brother Deceased   colon cancer  . Brother Deceased   heart disease  . Brother Alive   prostate cancer  . Daughter Alive   healthy    Review of Systems   A complete 10 system ROS was obtained and was negative apart from what is mentioned.   Objective:   VITALS:   Vitals:   09/30/15 0814  BP: 92/60  BP Location: Left Arm  Patient Position: Sitting  Cuff Size: Normal  Pulse: 82  Weight: 190 lb (86.2 kg)  Height: 6\' 2"  (1.88 m)   Wt Readings from Last 3 Encounters:  09/30/15 190 lb (86.2 kg)  06/13/15 184 lb 4 oz (83.6 kg)  06/06/15 193 lb (87.5 kg)     Gen:  Appears stated age and in NAD. HEENT:  Normocephalic, atraumatic. The mucous membranes are moist. The superficial temporal arteries are without ropiness or tenderness. CV:  RRR Lungs: CTAB Neck:  No  bruits. There is significant tenderness over the right occipital notch   NEUROLOGICAL:  Orientation:  The patient is alert and oriented x 3.  Recent and remote memory are intact.  Attention span and concentration are normal.  Able to name objects and repeat without trouble.  Fund of knowledge is appropriate Cranial nerves: There is good facial symmetry, but there is L ptosis (I looked at 0000000 drivers license and it was the same). Extraocular muscles are intact and visual fields are full to confrontational testing. Speech is fluent and clear. Soft palate rises symmetrically and there is no tongue deviation. Hearing is intact to conversational tone. Tone: Tone is good throughout.  Mild gegenhalten. Sensation: Sensation is intact to light touch throughout.  Hands are cold bilaterally. Coordination:  The patient has no dysdiadichokinesia or dysmetria. Motor: Strength is 5/5 in the bilateral upper and lower extremities.  Shoulder shrug is equal bilaterally.  There is no pronator drift.   Gait and Station: The patient ambulates well today  MOVEMENT EXAM: Tremor:  There is mild tremor of the outstretched hands.   LABS    Chemistry      Component Value Date/Time   NA 141 06/05/2015 0900   K 4.7 06/05/2015 0900   CL 109 06/05/2015 0900   CO2 26 06/05/2015 0900   BUN 19 06/05/2015 0900   CREATININE 1.55 (H) 06/05/2015 0900      Component Value Date/Time   CALCIUM 8.8 06/05/2015 0900   ALKPHOS 74 06/05/2015 0900   AST 30 06/05/2015 0900   ALT 42 06/05/2015 0900   BILITOT 0.4 06/05/2015 0900          Assessment/Plan:   1.  Essential Tremor.  -improved on primidone.  Continue with two in the AM (100 mg) and 100mg  at night.  Risks, benefits, side effects and alternative  therapies were discussed.  The opportunity to ask questions was given and they were answered to the best of my ability.  The patient expressed understanding and willingness to follow the outlined treatment protocols. 2.   Gait instability  -It is believed that ET alone can cause ataxia/balance changes due to changes in the cerebellar purkinje cell/climbing fiber synaptic transmission.   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  -looked better in this regard today 3.  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  Pt wanted lid lift but ophthalmology didn't want to proceed  5.  Headache  -no topamax due to hx of nephrolithiasis. Sounds like have some idiopathic stabbing headache but weary about giving indocin given borderline creatine.  Increase gabapentin 300 mg bid.   6.  Hand paresthesias, L thumb, pointer and middle finger  -EMG in 03/2015 was normal 7.  Follow up is anticipated in the next few months, sooner should new neurologic issues arise. Greater than 50% of 25 min visit in counseling.  Will call if increase in gabapentin not helpful.

## 2015-09-30 ENCOUNTER — Ambulatory Visit (INDEPENDENT_AMBULATORY_CARE_PROVIDER_SITE_OTHER): Payer: Medicare Other | Admitting: Neurology

## 2015-09-30 ENCOUNTER — Encounter: Payer: Self-pay | Admitting: Neurology

## 2015-09-30 VITALS — BP 92/60 | HR 82 | Ht 74.0 in | Wt 190.0 lb

## 2015-09-30 DIAGNOSIS — D2312 Other benign neoplasm of skin of left eyelid, including canthus: Secondary | ICD-10-CM | POA: Diagnosis not present

## 2015-09-30 DIAGNOSIS — G250 Essential tremor: Secondary | ICD-10-CM | POA: Diagnosis not present

## 2015-09-30 MED ORDER — PRIMIDONE 50 MG PO TABS: 100.0000 mg | ORAL_TABLET | Freq: Two times a day (BID) | ORAL | 5 refills | Status: DC

## 2015-09-30 MED ORDER — GABAPENTIN 300 MG PO CAPS: 300.0000 mg | ORAL_CAPSULE | Freq: Two times a day (BID) | ORAL | 5 refills | Status: DC

## 2015-09-30 NOTE — Patient Instructions (Signed)
1.  Increase gabapentin to 300 mg twice per day (one pill twice per day).  Let me know if that doesn't help your head pain

## 2015-10-11 ENCOUNTER — Other Ambulatory Visit: Payer: Self-pay | Admitting: Gastroenterology

## 2015-10-18 ENCOUNTER — Ambulatory Visit (INDEPENDENT_AMBULATORY_CARE_PROVIDER_SITE_OTHER): Payer: Medicare Other | Admitting: *Deleted

## 2015-10-18 DIAGNOSIS — Z7901 Long term (current) use of anticoagulants: Secondary | ICD-10-CM

## 2015-10-18 DIAGNOSIS — Z5181 Encounter for therapeutic drug level monitoring: Secondary | ICD-10-CM | POA: Diagnosis not present

## 2015-10-18 DIAGNOSIS — I824Y9 Acute embolism and thrombosis of unspecified deep veins of unspecified proximal lower extremity: Secondary | ICD-10-CM

## 2015-10-18 DIAGNOSIS — I4891 Unspecified atrial fibrillation: Secondary | ICD-10-CM | POA: Diagnosis not present

## 2015-10-18 LAB — POCT INR: INR: 2.1

## 2015-10-21 ENCOUNTER — Other Ambulatory Visit: Payer: Self-pay | Admitting: Family Medicine

## 2015-10-21 NOTE — Telephone Encounter (Signed)
Last office visit 06/13/2015.  Last refilled 08/22/2015 for #30 with 1 refill.

## 2015-10-22 NOTE — Telephone Encounter (Signed)
Zolpidem called into [facility].

## 2015-11-02 DIAGNOSIS — Z23 Encounter for immunization: Secondary | ICD-10-CM | POA: Diagnosis not present

## 2015-11-04 DIAGNOSIS — M79641 Pain in right hand: Secondary | ICD-10-CM | POA: Diagnosis not present

## 2015-11-04 DIAGNOSIS — M1711 Unilateral primary osteoarthritis, right knee: Secondary | ICD-10-CM | POA: Diagnosis not present

## 2015-11-04 DIAGNOSIS — M79642 Pain in left hand: Secondary | ICD-10-CM | POA: Diagnosis not present

## 2015-11-04 DIAGNOSIS — M25561 Pain in right knee: Secondary | ICD-10-CM | POA: Diagnosis not present

## 2015-11-11 DIAGNOSIS — M1711 Unilateral primary osteoarthritis, right knee: Secondary | ICD-10-CM | POA: Diagnosis not present

## 2015-11-18 DIAGNOSIS — M1711 Unilateral primary osteoarthritis, right knee: Secondary | ICD-10-CM | POA: Diagnosis not present

## 2015-11-21 DIAGNOSIS — M25531 Pain in right wrist: Secondary | ICD-10-CM | POA: Diagnosis not present

## 2015-11-21 DIAGNOSIS — M79641 Pain in right hand: Secondary | ICD-10-CM | POA: Diagnosis not present

## 2015-11-21 DIAGNOSIS — M79642 Pain in left hand: Secondary | ICD-10-CM | POA: Diagnosis not present

## 2015-11-21 DIAGNOSIS — M25532 Pain in left wrist: Secondary | ICD-10-CM | POA: Diagnosis not present

## 2015-11-27 ENCOUNTER — Ambulatory Visit (INDEPENDENT_AMBULATORY_CARE_PROVIDER_SITE_OTHER): Payer: Medicare Other | Admitting: *Deleted

## 2015-11-27 DIAGNOSIS — Z5181 Encounter for therapeutic drug level monitoring: Secondary | ICD-10-CM | POA: Diagnosis not present

## 2015-11-27 DIAGNOSIS — M1711 Unilateral primary osteoarthritis, right knee: Secondary | ICD-10-CM | POA: Diagnosis not present

## 2015-11-27 DIAGNOSIS — Z7901 Long term (current) use of anticoagulants: Secondary | ICD-10-CM

## 2015-11-27 DIAGNOSIS — I824Y9 Acute embolism and thrombosis of unspecified deep veins of unspecified proximal lower extremity: Secondary | ICD-10-CM

## 2015-11-27 DIAGNOSIS — I4891 Unspecified atrial fibrillation: Secondary | ICD-10-CM

## 2015-11-27 LAB — POCT INR: INR: 2.7

## 2015-11-29 DIAGNOSIS — L820 Inflamed seborrheic keratosis: Secondary | ICD-10-CM | POA: Diagnosis not present

## 2015-11-29 DIAGNOSIS — L570 Actinic keratosis: Secondary | ICD-10-CM | POA: Diagnosis not present

## 2015-11-29 DIAGNOSIS — Z85828 Personal history of other malignant neoplasm of skin: Secondary | ICD-10-CM | POA: Diagnosis not present

## 2015-12-02 DIAGNOSIS — M1711 Unilateral primary osteoarthritis, right knee: Secondary | ICD-10-CM | POA: Diagnosis not present

## 2015-12-15 ENCOUNTER — Other Ambulatory Visit: Payer: Self-pay | Admitting: Family Medicine

## 2015-12-15 NOTE — Telephone Encounter (Signed)
Last office visit 06/13/15.  Last refilled 10/21/15 for #30 with 1 refill.  Ok to refill?

## 2015-12-17 NOTE — Telephone Encounter (Signed)
Zolpidem called into [facility].

## 2015-12-19 DIAGNOSIS — M793 Panniculitis, unspecified: Secondary | ICD-10-CM | POA: Diagnosis not present

## 2015-12-20 ENCOUNTER — Other Ambulatory Visit: Payer: Self-pay | Admitting: Cardiology

## 2015-12-20 ENCOUNTER — Other Ambulatory Visit: Payer: Self-pay | Admitting: Family Medicine

## 2015-12-23 DIAGNOSIS — M25531 Pain in right wrist: Secondary | ICD-10-CM | POA: Diagnosis not present

## 2015-12-23 DIAGNOSIS — M25532 Pain in left wrist: Secondary | ICD-10-CM | POA: Diagnosis not present

## 2015-12-26 ENCOUNTER — Telehealth: Payer: Self-pay

## 2015-12-26 NOTE — Telephone Encounter (Signed)
[facility] office calls to make [place] aware of a multi prescribing notice that they received this week on patient from [facility] R/X.  Apparently patient has been receiving Omeprazole ordered by our office through [place] also receiving Lansoprazole by their office with [facility].    Please let them know which R/X and prescriber we want to go with and call them back.  Thanks.

## 2015-12-27 ENCOUNTER — Other Ambulatory Visit: Payer: Self-pay | Admitting: *Deleted

## 2015-12-27 NOTE — Telephone Encounter (Signed)
COntact pr.. Which works better for him. Should be using one.

## 2015-12-27 NOTE — Telephone Encounter (Signed)
Spoke with Mr. Guerette.  He states he is unsure of his medications because his wife takes care of everything for him  He states he will just take the Prevacid that the GI is prescribing and will let them take care of the refills for this medication.  Call [facility] back but there office was closed.  Will try reaching them again on Monday.

## 2015-12-27 NOTE — Telephone Encounter (Signed)
Left message for [name] to return my call to discuss his PPI medications.

## 2015-12-30 DIAGNOSIS — M1711 Unilateral primary osteoarthritis, right knee: Secondary | ICD-10-CM | POA: Diagnosis not present

## 2015-12-30 NOTE — Telephone Encounter (Signed)
[name] at [facility] notified patient will stick with Prevacid and let Dr. Vira Agar be the prescribing doctor.

## 2016-01-13 DIAGNOSIS — M25532 Pain in left wrist: Secondary | ICD-10-CM | POA: Diagnosis not present

## 2016-01-13 DIAGNOSIS — M25531 Pain in right wrist: Secondary | ICD-10-CM | POA: Diagnosis not present

## 2016-01-15 ENCOUNTER — Ambulatory Visit (INDEPENDENT_AMBULATORY_CARE_PROVIDER_SITE_OTHER): Payer: Medicare Other

## 2016-01-15 DIAGNOSIS — Z7901 Long term (current) use of anticoagulants: Secondary | ICD-10-CM

## 2016-01-15 DIAGNOSIS — I4891 Unspecified atrial fibrillation: Secondary | ICD-10-CM | POA: Diagnosis not present

## 2016-01-15 DIAGNOSIS — Z5181 Encounter for therapeutic drug level monitoring: Secondary | ICD-10-CM

## 2016-01-15 DIAGNOSIS — I824Y9 Acute embolism and thrombosis of unspecified deep veins of unspecified proximal lower extremity: Secondary | ICD-10-CM | POA: Diagnosis not present

## 2016-01-15 LAB — POCT INR: INR: 1.8

## 2016-01-29 DIAGNOSIS — M25531 Pain in right wrist: Secondary | ICD-10-CM | POA: Diagnosis not present

## 2016-01-29 DIAGNOSIS — M793 Panniculitis, unspecified: Secondary | ICD-10-CM | POA: Diagnosis not present

## 2016-01-29 DIAGNOSIS — M79642 Pain in left hand: Secondary | ICD-10-CM | POA: Diagnosis not present

## 2016-01-29 DIAGNOSIS — M25532 Pain in left wrist: Secondary | ICD-10-CM | POA: Diagnosis not present

## 2016-01-29 DIAGNOSIS — M79641 Pain in right hand: Secondary | ICD-10-CM | POA: Diagnosis not present

## 2016-02-04 ENCOUNTER — Encounter: Payer: Self-pay | Admitting: Family Medicine

## 2016-02-04 ENCOUNTER — Ambulatory Visit (INDEPENDENT_AMBULATORY_CARE_PROVIDER_SITE_OTHER): Payer: Medicare Other | Admitting: Family Medicine

## 2016-02-04 VITALS — BP 118/78 | HR 98 | Temp 98.3°F | Ht 74.0 in | Wt 191.4 lb

## 2016-02-04 DIAGNOSIS — R062 Wheezing: Secondary | ICD-10-CM

## 2016-02-04 DIAGNOSIS — H6591 Unspecified nonsuppurative otitis media, right ear: Secondary | ICD-10-CM

## 2016-02-04 DIAGNOSIS — J208 Acute bronchitis due to other specified organisms: Secondary | ICD-10-CM | POA: Diagnosis not present

## 2016-02-04 MED ORDER — CEFDINIR 300 MG PO CAPS: 600.0000 mg | ORAL_CAPSULE | Freq: Every day | ORAL | 0 refills | Status: DC

## 2016-02-04 MED ORDER — HYDROCODONE-HOMATROPINE 5-1.5 MG/5ML PO SYRP: ORAL_SOLUTION | ORAL | 0 refills | Status: DC

## 2016-02-04 NOTE — Progress Notes (Signed)
Dr. Frederico Hamman T. Pavan Bring, MD, [facility] and Sports Medicine [place], [address] Phone: [phone] Fax: [phone]  02/04/2016  Patient: [name], MRN: [id number], DOB: Jul 31, 1940, 75 y.o.  Primary Physician:  Eliezer Lofts, MD   Chief Complaint  Patient presents with  . Sore Throat  . Headache    with sinus presssure  . Cough    with chest congestion--productive  . Ear Fullness    right ear--pain   Subjective:   [name] is a 75 y.o. very pleasant male patient who presents with the following:  Spitting up globs of yellow and gray stuff, R ear is killing him for 2 days, head is full and HA all the time. Coughing with deep breath. Some SOB but not that bad.   R ear is bad.  ST and drainage, also.   Past Medical History, Surgical History, Social History, Family History, Problem List, Medications, and Allergies have been reviewed and updated if relevant.  Patient Active Problem List   Diagnosis Date Noted  . Internal hernia 03/08/2015  . GERD (gastroesophageal reflux disease) 08/03/2014  . Acid reflux 08/03/2014  . ED (erectile dysfunction) of organic origin 07/02/2014  . Type 2 diabetes mellitus with neurologic complication ([place]) [id number]  . Counseling regarding end of life decision making 01/23/2014  . Dysphagia, pharyngoesophageal phase 08/30/2013  . Benign essential tremor 08/30/2013  . COPD, mild ([place]) 07/13/2013  . Postoperative pain, acute, shoulder 05/13/2013  . Arthralgia of shoulder 05/13/2013  . Cervical spinal stenosis 05/09/2013  . Encounter for therapeutic drug monitoring 04/26/2013  . Peripheral autonomic neuropathy due to diabetes mellitus ([place]) 12/11/2011  . Hereditary and idiopathic neuropathy 12/11/2011  . 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 History of surgical procedure 09/03/2011  . Atrial fibrillation, permanent ([place]) 08/12/2011  . Atrial fibrillation ([place]) 08/12/2011    . Bacterial overgrowth syndrome 10/28/2010  . Nonsurgical dumping syndrome 10/14/2010  . 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 Diverticulitis of colon 09/02/2010  . Acute venous embolism and thrombosis of deep vessels of proximal lower extremity ([place]) 08/01/2010  . Vascular disorder of lower extremity 08/01/2010  . OSTEOPOROSIS, DRUG-INDUCED 11/26/2009  . Anemia 09/02/2009  . RENAL INSUFFICIENCY 09/02/2009  . Allergic rhinitis 01/17/2009  . ASTHMA, PERSISTENT, MILD 11/13/2008  . Airway hyperreactivity 11/13/2008  . KNEE REPLACEMENT, LEFT, HX OF 12/18/2006  . HERPES ZOSTER W/NERVOUS COMPLICATION NEC [id number]  . HYPERCHOLESTEROLEMIA 05/19/2006  . ANXIETY 05/19/2006  . Major depressive disorder, recurrent, mild ([place]) 05/19/2006  . RESTLESS LEG SYNDROME 05/19/2006  . DIVERTICULOSIS, COLON 05/19/2006  . IRRITABLE BOWEL SYNDROME 05/19/2006  . LOW BACK PAIN, CHRONIC 05/19/2006  . GAD (generalized anxiety disorder) 05/19/2006  . Diverticular disease of large intestine 05/19/2006  . Essential (primary) hypertension 05/19/2006  . Adaptive colitis 05/19/2006    Past Medical History:  Diagnosis Date  . Anxiety state, unspecified   . Arthritis    hands   . Bruises easily    pt is on Coumadin  . Cancer ([place])    SKIN CANCER REMOVED  . Chronic atrial fibrillation (HCC)    a.fib on warfarin- Dr. Percival Spanish follows  . Depressive disorder, not elsewhere classified    takes Prozac daily  . Diverticulosis of colon (without mention of hemorrhage)   . ED (erectile dysfunction)   . GERD (gastroesophageal reflux disease)   . Hard of hearing    wears hearing aids  . History of  blood clots    behind right knee and then went into left lung 74yrs ago  . History of colon polyps   . History of kidney stones   . Hx of diabetes mellitus    "no longer diabetic since gastric bypass" - no longer taking metformin"  in 2 months "problems with low blood sugar now"  . Hx of gout     but doesn't take any meds  . Inflammation of colonic mucosa    recent admission and release from [facility] 02-28-15-remains on oral antibiotic  . Joint pain   . Peripheral edema    takes Furosemide daily  . Peripheral vascular disease ([place])   . Pneumonia    last time about 57yrs ago  . Pulmonary embolism ([place])    over 10 yrs ago "many years ago"  . Restless legs syndrome (RLS)   . Unspecified asthma(493.90)    as a child  . Unspecified essential hypertension    hx of no longer on medication due to gastric bypass     Past Surgical History:  Procedure Laterality Date  . ANTERIOR CERVICAL DECOMP/DISCECTOMY FUSION N/A 05/09/2013   Procedure: ANTERIOR CERVICAL DECOMPRESSION/DISCECTOMY FUSION CERVICAL THREE-FOUR,CERVICAL FOUR-FIVE,CERVICAL FIVE-SIX;  Surgeon: Floyce Stakes, MD;  [facility]: [facility];  Service: Neurosurgery;  Laterality: N/A;  . ARTERY REPAIR     Left forearm  . BACK SURGERY     x 3  . BREATH TEK H PYLORI  01/08/2011   Procedure: BREATH TEK H PYLORI;  Surgeon: Pedro Earls, MD;  Location: [facility];  Service: General;  Laterality: N/A;  . CARDIOVERSION     x 2 attempts-unsuccessful.  [name] CATARACT EXTRACTION, BILATERAL    . COLONOSCOPY    . CYSTOSCOPY    . ESOPHAGOGASTRODUODENOSCOPY (EGD) WITH PROPOFOL N/A 09/21/2014   Procedure: ESOPHAGOGASTRODUODENOSCOPY (EGD) WITH PROPOFOL;  Surgeon: Manya Silvas, MD;  Location: [facility];  Service: Endoscopy;  Laterality: N/A;  . EYE SURGERY     cataract bil  . FOOT SURGERY     Left foot   . GASTRIC ROUX-EN-Y  08/11/2011   Procedure: LAPAROSCOPIC ROUX-EN-Y GASTRIC BYPASS WITH UPPER ENDOSCOPY;  Surgeon: Pedro Earls, MD;  Location: [facility];  Service: General;  Laterality: N/A;  . HAND SURGERY     LEFT  . injections in back     x 18  . KNEE SURGERY Right    x 4  . KNEE SURGERY Left    arthroscopy  . LAPAROSCOPIC INTERNAL HERNIA REPAIR N/A 03/08/2015   Procedure: LAPAROSCOPIC INTERNAL HERNIA REPAIR ;  Surgeon:  [name], MD;  Location: [facility];  Service: General;  Laterality: N/A;  . LEG SURGERY     FOR NECROTIZING FASCITIS L LEG AND GROIN  . PENILE PROSTHESIS IMPLANT N/A 07/02/2014   Procedure: PENILE PROTHESIS INFLATABLE 3 PIECE (COLOPLAST) SCROTAL APPROACH;  Surgeon: Kathie Rhodes, MD;  [facility];  Service: Urology;  Laterality: N/A;  . PENILE PROSTHESIS IMPLANT N/A 11/23/2014   Procedure: EXPLORATION AND REVISION OF PENILE PROSTHESIS;  Surgeon: Kathie Rhodes, MD;  [facility]: [facility];  Service: Urology;  Laterality: N/A;  . REPLACEMENT TOTAL KNEE Left    x 2  . SHOULDER ARTHROSCOPY    . SHOULDER SURGERY Left 2014  . TONSILLECTOMY    . TOTAL SHOULDER ARTHROPLASTY Left 01/26/2013   Procedure: TOTAL SHOULDER ARTHROPLASTY;  Surgeon: Nita Sells, MD;  Location: [facility];  Service: Orthopedics;  Laterality: Left;  Left total shoulder arthroplasty  Social History   Social History  . Marital status: Married    Spouse name: N/A  . Number of children: 1  . Years of education: N/A   Occupational History  . Retired     Administrator   Social History Main Topics  . Smoking status: Never Smoker  . Smokeless tobacco: Never Used  . Alcohol use No  . Drug use: No  . Sexual activity: Yes   Other Topics Concern  . Not on file   Social History Narrative   DIET: 3 meals, F&V, some water, crystal light, No fast food   Exercise: walks treadmill 30 min      1 Caffeine drinks daily       No living will.           Family History  Problem Relation Age of Onset  . Uterine cancer Mother     mets  . Breast cancer Mother   . Cancer Mother     cervical  . Emphysema Father   . Alzheimer's disease Sister   . Prostate cancer Brother   . Heart attack Brother   . Colon cancer Brother 8    Allergies  Allergen Reactions  . Sulfacetamide Sodium     REACTION: Throat swelling  . Adhesive [Tape]     Tears skin-- "No tape of any kind, they all tear skin right off" Tears skin--  "No tape of any kind, they all tear skin right off"    Medication list reviewed and updated in full in [place].  ROS: GEN: Acute illness details above GI: Tolerating PO intake GU: maintaining adequate hydration and urination Pulm: No SOB Interactive and getting along well at home.  Otherwise, ROS is as per the HPI.  Objective:   BP 118/78   Pulse 98   Temp 98.3 F (36.8 C) (Oral)   Ht 6\' 2"  (1.88 m)   Wt 191 lb 6.4 oz (86.8 kg)   SpO2 95%   BMI 24.57 kg/m    GEN: A and O x 3. WDWN. NAD.    ENT: Nose clear, ext NML.  No LAD.  No JVD.  TM's on R bulging, cannot see landmarks. L TM clear. Oropharynx clear.  PULM: Normal WOB, no distress. No crackles, rare wheezes, no rhonchi. CV: RRR, no M/G/R, No rubs, No JVD.   EXT: warm and well-perfused, No c/c/e. PSYCH: Pleasant and conversant.    Laboratory and Imaging Data:  Assessment and Plan:   Right otitis media with effusion  Acute bronchitis due to other specified organisms  Wheezing  On coumadin, use omnicef with ROM Hycodan prn Has albuterol at home  Follow-up: No Follow-up on file.  Meds ordered this encounter  Medications  . cefdinir (OMNICEF) 300 MG capsule    Sig: Take 2 capsules (600 mg total) by mouth daily.    Dispense:  20 capsule    Refill:  0  . HYDROcodone-homatropine (HYCODAN) 5-1.5 MG/5ML syrup    Sig: 1 tsp po at night before bed prn cough    Dispense:  180 mL    Refill:  0   Medications Discontinued During This Encounter  Medication Reason  . doxycycline (VIBRAMYCIN) 100 MG capsule    No orders of the defined types were placed in this encounter.   Signed,  Maud Deed. Claudius Mich, MD   Allergies as of 02/04/2016      Reactions   Sulfacetamide Sodium    REACTION: Throat swelling   Adhesive [tape]  Tears skin-- "No tape of any kind, they all tear skin right off" Tears skin-- "No tape of any kind, they all tear skin right off"      Medication List       Accurate as of  02/04/16  3:15 PM. Always use your most recent med list.          atorvastatin 80 MG tablet Commonly known as:  LIPITOR Take 1 tablet by mouth at  bedtime   cefdinir 300 MG capsule Commonly known as:  OMNICEF Take 2 capsules (600 mg total) by mouth daily.   FLUoxetine 40 MG capsule Commonly known as:  PROZAC Take 1 capsule by mouth  daily   furosemide 20 MG tablet Commonly known as:  LASIX TAKE 1 TO 2 TABLETS BY  MOUTH DAILY   gabapentin 300 MG capsule Commonly known as:  NEURONTIN Take 1 capsule (300 mg total) by mouth 2 (two) times daily.   HYDROcodone-homatropine 5-1.5 MG/5ML syrup Commonly known as:  HYCODAN 1 tsp po at night before bed prn cough   lansoprazole 30 MG capsule Commonly known as:  PREVACID TAKE 1 CAPSULE (30 MG TOTAL) BY MOUTH 2 (TWO) TIMES DAILY.   lisinopril 2.5 MG tablet Commonly known as:  PRINIVIL,ZESTRIL Take 2.5 mg by mouth every morning.   primidone 50 MG tablet Commonly known as:  MYSOLINE Take 2 tablets (100 mg total) by mouth 2 (two) times daily.   ramelteon 8 MG tablet Commonly known as:  ROZEREM Take 1 tablet (8 mg total) by mouth at bedtime.   ranitidine 300 MG tablet Commonly known as:  ZANTAC Take 1 tablet by mouth at bedtime.   silodosin 8 MG Caps capsule Commonly known as:  RAPAFLO Take 8 mg by mouth daily with breakfast.   traZODone 50 MG tablet Commonly known as:  DESYREL Take 0.5-1 tablets (25-50 mg total) by mouth at bedtime as needed for sleep.   warfarin 2.5 MG tablet Commonly known as:  COUMADIN TAKE 1 TO 1.5 TABLETS BY  MOUTH DAILY OR AS DIRECTED  BY COUMADIN CLINIC   zolpidem 10 MG tablet Commonly known as:  AMBIEN TAKE 1 TABLET BY MOUTH AT BEDTIME AS NEEDED FOR SLEEP

## 2016-02-04 NOTE — Progress Notes (Signed)
Pre visit review using our clinic review tool, if applicable. No additional management support is needed unless otherwise documented below in the visit note. 

## 2016-02-05 ENCOUNTER — Telehealth: Payer: Self-pay

## 2016-02-05 NOTE — Telephone Encounter (Signed)
Work note done and left in the front office.  Called and spoken to patient's spouse that work note is ready for pick up.  Patient's spouse verbalized understanding.

## 2016-02-05 NOTE — Telephone Encounter (Signed)
Please help, I think that this is reasonable.

## 2016-02-05 NOTE — Telephone Encounter (Signed)
V/M was left requesting note to be out of work from 02/04/16 until 02/10/16. Request cb when ready for pick up at front desk. Pt was seen 02/04/16.

## 2016-02-11 DIAGNOSIS — K588 Other irritable bowel syndrome: Secondary | ICD-10-CM | POA: Diagnosis not present

## 2016-02-11 DIAGNOSIS — K219 Gastro-esophageal reflux disease without esophagitis: Secondary | ICD-10-CM | POA: Diagnosis not present

## 2016-02-11 DIAGNOSIS — K5730 Diverticulosis of large intestine without perforation or abscess without bleeding: Secondary | ICD-10-CM | POA: Diagnosis not present

## 2016-02-15 ENCOUNTER — Other Ambulatory Visit: Payer: Self-pay | Admitting: Family Medicine

## 2016-02-15 NOTE — Telephone Encounter (Signed)
Last office visit 02/04/2016 with Dr. Lorelei Pont.  Last refilled 12/17/15 for #30 with 1 refill.  Ok to refill?

## 2016-02-17 DIAGNOSIS — D649 Anemia, unspecified: Secondary | ICD-10-CM | POA: Diagnosis not present

## 2016-02-17 DIAGNOSIS — Z5181 Encounter for therapeutic drug level monitoring: Secondary | ICD-10-CM | POA: Diagnosis not present

## 2016-02-17 DIAGNOSIS — E1142 Type 2 diabetes mellitus with diabetic polyneuropathy: Secondary | ICD-10-CM | POA: Diagnosis not present

## 2016-02-17 DIAGNOSIS — Z9884 Bariatric surgery status: Secondary | ICD-10-CM | POA: Diagnosis not present

## 2016-02-18 NOTE — Telephone Encounter (Signed)
Zolpidem called into [id number] - [place], [place] - [address] Phone: [phone] 

## 2016-02-19 ENCOUNTER — Ambulatory Visit (INDEPENDENT_AMBULATORY_CARE_PROVIDER_SITE_OTHER): Payer: Medicare Other | Admitting: Family Medicine

## 2016-02-19 ENCOUNTER — Encounter: Payer: Self-pay | Admitting: Family Medicine

## 2016-02-19 VITALS — BP 92/64 | HR 76 | Temp 97.8°F | Ht 74.0 in

## 2016-02-19 DIAGNOSIS — J441 Chronic obstructive pulmonary disease with (acute) exacerbation: Secondary | ICD-10-CM

## 2016-02-19 DIAGNOSIS — J440 Chronic obstructive pulmonary disease with acute lower respiratory infection: Secondary | ICD-10-CM | POA: Diagnosis not present

## 2016-02-19 MED ORDER — AMOXICILLIN 500 MG PO CAPS: 1000.0000 mg | ORAL_CAPSULE | Freq: Two times a day (BID) | ORAL | 0 refills | Status: DC

## 2016-02-19 NOTE — Progress Notes (Signed)
Pre visit review using our clinic review tool, if applicable. No additional management support is needed unless otherwise documented below in the visit note. 

## 2016-02-19 NOTE — Progress Notes (Signed)
Dr. Frederico Hamman T. Meisha Salone, MD, [facility] and [facility] Medicine [place], [address] Phone: [phone] Fax: [phone]  02/19/2016  Patient: [name], MRN: [id number], DOB: 1940-10-12, 76 y.o.  Primary Physician:  Eliezer Lofts, MD   Chief Complaint  Patient presents with  . Headache  . Ears Popping  . Cough    with chest congestion  . Nasal Congestion   Subjective:   [name] is a 76 y.o. very pleasant male patient who presents with the following:  I previously saw this gentleman on February 04, 2016, and at that point he had some otitis media on the right, and an ongoing cold.  I placed him on Omnicef.  He ended up doing better and was feeling a bit better for at least a few days, and then he went to a funeral and was outside in the very cold weather.  Was doing better - had to go to a funeral - graveside.  Now doing poorly again today.   More congestion and shortness of breath. He is coughing quite a bit.  He is also having some nasal congestion. Ear popping all day.   Past Medical History, Surgical History, Social History, Family History, Problem List, Medications, and Allergies have been reviewed and updated if relevant.  Patient Active Problem List   Diagnosis Date Noted  . Internal hernia 03/08/2015  . GERD (gastroesophageal reflux disease) 08/03/2014  . Acid reflux 08/03/2014  . ED (erectile dysfunction) of organic origin 07/02/2014  . Type 2 diabetes mellitus with neurologic complication ([place]) [id number]  . Counseling regarding end of life decision making 01/23/2014  . Dysphagia, pharyngoesophageal phase 08/30/2013  . Benign essential tremor 08/30/2013  . COPD, mild ([place]) 07/13/2013  . Postoperative pain, acute, shoulder 05/13/2013  . Arthralgia of shoulder 05/13/2013  . Cervical spinal stenosis 05/09/2013  . Encounter for therapeutic drug monitoring 04/26/2013  . Peripheral autonomic neuropathy due to  diabetes mellitus ([place]) 12/11/2011  . Hereditary and idiopathic neuropathy 12/11/2011  . 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 History of surgical procedure 09/03/2011  . Atrial fibrillation, permanent ([place]) 08/12/2011  . Atrial fibrillation ([place]) 08/12/2011  . Bacterial overgrowth syndrome 10/28/2010  . Nonsurgical dumping syndrome 10/14/2010  . 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 Diverticulitis of colon 09/02/2010  . Acute venous embolism and thrombosis of deep vessels of proximal lower extremity ([place]) 08/01/2010  . Vascular disorder of lower extremity 08/01/2010  . OSTEOPOROSIS, DRUG-INDUCED 11/26/2009  . Anemia 09/02/2009  . RENAL INSUFFICIENCY 09/02/2009  . Allergic rhinitis 01/17/2009  . ASTHMA, PERSISTENT, MILD 11/13/2008  . Airway hyperreactivity 11/13/2008  . KNEE REPLACEMENT, LEFT, HX OF 12/18/2006  . HERPES ZOSTER W/NERVOUS COMPLICATION NEC [id number]  . HYPERCHOLESTEROLEMIA 05/19/2006  . ANXIETY 05/19/2006  . Major depressive disorder, recurrent, mild ([place]) 05/19/2006  . RESTLESS LEG SYNDROME 05/19/2006  . DIVERTICULOSIS, COLON 05/19/2006  . IRRITABLE BOWEL SYNDROME 05/19/2006  . LOW BACK PAIN, CHRONIC 05/19/2006  . GAD (generalized anxiety disorder) 05/19/2006  . Diverticular disease of large intestine 05/19/2006  . Essential (primary) hypertension 05/19/2006  . Adaptive colitis 05/19/2006    Past Medical History:  Diagnosis Date  . Anxiety state, unspecified   . Arthritis    hands   . Bruises easily    pt is on Coumadin  . Cancer ([place])    SKIN CANCER REMOVED  . Chronic atrial fibrillation (HCC)    a.fib on warfarin- Dr. Percival Spanish  follows  . Depressive disorder, not elsewhere classified    takes Prozac daily  . Diverticulosis of colon (without mention of hemorrhage)   . ED (erectile dysfunction)   . GERD (gastroesophageal reflux disease)   . Hard of hearing    wears hearing aids  . History of blood clots    behind  right knee and then went into left lung 54yrs ago  . History of colon polyps   . History of kidney stones   . Hx of diabetes mellitus    "no longer diabetic since gastric bypass" - no longer taking metformin"  in 2 months "problems with low blood sugar now"  . Hx of gout    but doesn't take any meds  . Inflammation of colonic mucosa    recent admission and release from [facility] 02-28-15-remains on oral antibiotic  . Joint pain   . Peripheral edema    takes Furosemide daily  . Peripheral vascular disease ([place])   . Pneumonia    last time about 18yrs ago  . Pulmonary embolism ([place])    over 10 yrs ago "many years ago"  . Restless legs syndrome (RLS)   . Unspecified asthma(493.90)    as a child  . Unspecified essential hypertension    hx of no longer on medication due to gastric bypass     Past Surgical History:  Procedure Laterality Date  . ANTERIOR CERVICAL DECOMP/DISCECTOMY FUSION N/A 05/09/2013   Procedure: ANTERIOR CERVICAL DECOMPRESSION/DISCECTOMY FUSION CERVICAL THREE-FOUR,CERVICAL FOUR-FIVE,CERVICAL FIVE-SIX;  Surgeon: Floyce Stakes, MD;  Location: [facility];  Service: Neurosurgery;  Laterality: N/A;  . ARTERY REPAIR     Left forearm  . BACK SURGERY     x 3  . BREATH TEK H PYLORI  01/08/2011   Procedure: BREATH TEK H PYLORI;  Surgeon: Pedro Earls, MD;  Location: [facility];  Service: General;  Laterality: N/A;  . CARDIOVERSION     x 2 attempts-unsuccessful.  [name] CATARACT EXTRACTION, BILATERAL    . COLONOSCOPY    . CYSTOSCOPY    . ESOPHAGOGASTRODUODENOSCOPY (EGD) WITH PROPOFOL N/A 09/21/2014   Procedure: ESOPHAGOGASTRODUODENOSCOPY (EGD) WITH PROPOFOL;  Surgeon: Manya Silvas, MD;  Location: [facility] ENDOSCOPY;  Service: Endoscopy;  Laterality: N/A;  . EYE SURGERY     cataract bil  . FOOT SURGERY     Left foot   . GASTRIC ROUX-EN-Y  08/11/2011   Procedure: LAPAROSCOPIC ROUX-EN-Y GASTRIC BYPASS WITH UPPER ENDOSCOPY;  Surgeon: Pedro Earls, MD;  [facility];   Service: General;  Laterality: N/A;  . HAND SURGERY     LEFT  . injections in back     x 18  . KNEE SURGERY Right    x 4  . KNEE SURGERY Left    arthroscopy  . LAPAROSCOPIC INTERNAL HERNIA REPAIR N/A 03/08/2015   Procedure: LAPAROSCOPIC INTERNAL HERNIA REPAIR ;  Surgeon: [name], MD;  [facility];  Service: General;  Laterality: N/A;  . LEG SURGERY     FOR NECROTIZING FASCITIS L LEG AND GROIN  . PENILE PROSTHESIS IMPLANT N/A 07/02/2014   Procedure: PENILE PROTHESIS INFLATABLE 3 PIECE (COLOPLAST) SCROTAL APPROACH;  Surgeon: Kathie Rhodes, MD;  [facility];  Service: Urology;  Laterality: N/A;  . PENILE PROSTHESIS IMPLANT N/A 11/23/2014   Procedure: EXPLORATION AND REVISION OF PENILE PROSTHESIS;  Surgeon: Kathie Rhodes, MD;  [facility];  Service: Urology;  Laterality: N/A;  . REPLACEMENT TOTAL KNEE Left    x 2  .  SHOULDER ARTHROSCOPY    . SHOULDER SURGERY Left 2014  . TONSILLECTOMY    . TOTAL SHOULDER ARTHROPLASTY Left 01/26/2013   Procedure: TOTAL SHOULDER ARTHROPLASTY;  Surgeon: Nita Sells, MD;  Location: [facility];  Service: Orthopedics;  Laterality: Left;  Left total shoulder arthroplasty    Social History   Social History  . Marital status: Married    Spouse name: N/A  . Number of children: 1  . Years of education: N/A   Occupational History  . Retired     Administrator   Social History Main Topics  . Smoking status: Never Smoker  . Smokeless tobacco: Never Used  . Alcohol use No  . Drug use: No  . Sexual activity: Yes   Other Topics Concern  . Not on file   Social History Narrative   DIET: 3 meals, F&V, some water, crystal light, No fast food   Exercise: walks treadmill 30 min      1 Caffeine drinks daily       No living will.           Family History  Problem Relation Age of Onset  . Uterine cancer Mother     mets  . Breast cancer Mother   . Cancer Mother     cervical  . Emphysema Father   . Alzheimer's disease Sister    . Prostate cancer Brother   . Heart attack Brother   . Colon cancer Brother 83    Allergies  Allergen Reactions  . Sulfacetamide Sodium     REACTION: Throat swelling  . Adhesive [Tape]     Tears skin-- "No tape of any kind, they all tear skin right off" Tears skin-- "No tape of any kind, they all tear skin right off"    Medication list reviewed and updated in full in [place].  ROS: GEN: Acute illness details above GI: Tolerating PO intake GU: maintaining adequate hydration and urination Pulm: No SOB Interactive and getting along well at home.  Otherwise, ROS is as per the HPI.  Objective:   BP 92/64   Pulse 76   Temp 97.8 F (36.6 C) (Oral)   Ht 6\' 2"  (1.88 m)   SpO2 98%    GEN: A and O x 3. WDWN. NAD.    ENT: Nose clear, ext NML.  No LAD.  No JVD.  TM's clear. Oropharynx clear.  PULM: Normal WOB, no distress. No crackles, minor scattered wheezing CV: RRR, no M/G/R, No rubs, No JVD.   EXT: warm and well-perfused, No c/c/e. PSYCH: Pleasant and conversant.    Laboratory and Imaging Data:  Assessment and Plan:   Bronchitis, chronic obstructive w acute bronchitis (HCC)  COPD exacerbation (HCC)  Bronchitis with mild COPD exacerbation.  Pulse ox is 98% here in the office today.  I'm going to hold on steroids for now.  I asked him to call if he gets worse in any way.  P.o. Amoxicillin and supportive care.  He has follow-up Coumadin check on Friday.  Follow-up: No Follow-up on file.  Meds ordered this encounter  Medications  . nystatin cream (MYCOSTATIN)  . DISCONTD: doxycycline (VIBRA-TABS) 100 MG tablet    Sig: Take 100 mg by mouth 2 (two) times daily.    Refill:  1  . amoxicillin (AMOXIL) 500 MG capsule    Sig: Take 2 capsules (1,000 mg total) by mouth 2 (two) times daily.    Dispense:  40 capsule    Refill:  0   Medications Discontinued During This Encounter  Medication Reason  . doxycycline (VIBRA-TABS) 100 MG tablet Completed Course  .  HYDROcodone-homatropine (HYCODAN) 5-1.5 MG/5ML syrup Completed Course  . cefdinir (OMNICEF) 300 MG capsule Completed Course   Signed,  Frederico Hamman T. Margit Batte, MD   Allergies as of 02/19/2016      Reactions   Sulfacetamide Sodium    REACTION: Throat swelling   Adhesive [tape]    Tears skin-- "No tape of any kind, they all tear skin right off" Tears skin-- "No tape of any kind, they all tear skin right off"      Medication List       Accurate as of 02/19/16 11:59 PM. Always use your most recent med list.          amoxicillin 500 MG capsule Commonly known as:  AMOXIL Take 2 capsules (1,000 mg total) by mouth 2 (two) times daily.   atorvastatin 80 MG tablet Commonly known as:  LIPITOR Take 1 tablet by mouth at  bedtime   FLUoxetine 40 MG capsule Commonly known as:  PROZAC Take 1 capsule by mouth  daily   furosemide 20 MG tablet Commonly known as:  LASIX TAKE 1 TO 2 TABLETS BY  MOUTH DAILY   gabapentin 300 MG capsule Commonly known as:  NEURONTIN Take 1 capsule (300 mg total) by mouth 2 (two) times daily.   lansoprazole 30 MG capsule Commonly known as:  PREVACID TAKE 1 CAPSULE (30 MG TOTAL) BY MOUTH 2 (TWO) TIMES DAILY.   lisinopril 2.5 MG tablet Commonly known as:  PRINIVIL,ZESTRIL Take 2.5 mg by mouth every morning.   nystatin cream Commonly known as:  MYCOSTATIN   primidone 50 MG tablet Commonly known as:  MYSOLINE Take 2 tablets (100 mg total) by mouth 2 (two) times daily.   ramelteon 8 MG tablet Commonly known as:  ROZEREM Take 1 tablet (8 mg total) by mouth at bedtime.   ranitidine 300 MG tablet Commonly known as:  ZANTAC Take 1 tablet by mouth at bedtime.   silodosin 8 MG Caps capsule Commonly known as:  RAPAFLO Take 8 mg by mouth daily with breakfast.   traZODone 50 MG tablet Commonly known as:  DESYREL Take 0.5-1 tablets (25-50 mg total) by mouth at bedtime as needed for sleep.   warfarin 2.5 MG tablet Commonly known as:  COUMADIN TAKE 1 TO  1.5 TABLETS BY  MOUTH DAILY OR AS DIRECTED  BY COUMADIN CLINIC   zolpidem 10 MG tablet Commonly known as:  AMBIEN TAKE 1 TABLET BY MOUTH AT BEDTIME AS NEEDED FOR SLEEP

## 2016-02-24 ENCOUNTER — Ambulatory Visit (INDEPENDENT_AMBULATORY_CARE_PROVIDER_SITE_OTHER): Payer: Medicare Other

## 2016-02-24 DIAGNOSIS — Z5181 Encounter for therapeutic drug level monitoring: Secondary | ICD-10-CM | POA: Diagnosis not present

## 2016-02-24 DIAGNOSIS — Z7901 Long term (current) use of anticoagulants: Secondary | ICD-10-CM

## 2016-02-24 DIAGNOSIS — I824Y9 Acute embolism and thrombosis of unspecified deep veins of unspecified proximal lower extremity: Secondary | ICD-10-CM | POA: Diagnosis not present

## 2016-02-24 DIAGNOSIS — I4891 Unspecified atrial fibrillation: Secondary | ICD-10-CM

## 2016-02-24 LAB — POCT INR: INR: 2.2

## 2016-02-28 ENCOUNTER — Telehealth: Payer: Self-pay

## 2016-02-28 ENCOUNTER — Ambulatory Visit (INDEPENDENT_AMBULATORY_CARE_PROVIDER_SITE_OTHER)
Admission: RE | Admit: 2016-02-28 | Discharge: 2016-02-28 | Disposition: A | Discharge status: Home / Self Care | Payer: Medicare Other | Source: Ambulatory Visit | Attending: Family Medicine | Admitting: Family Medicine

## 2016-02-28 ENCOUNTER — Ambulatory Visit (INDEPENDENT_AMBULATORY_CARE_PROVIDER_SITE_OTHER): Payer: Medicare Other | Admitting: Family Medicine

## 2016-02-28 ENCOUNTER — Telehealth: Payer: Self-pay | Admitting: *Deleted

## 2016-02-28 DIAGNOSIS — R05 Cough: Secondary | ICD-10-CM | POA: Diagnosis not present

## 2016-02-28 DIAGNOSIS — R053 Chronic cough: Secondary | ICD-10-CM

## 2016-02-28 DIAGNOSIS — Z5181 Encounter for therapeutic drug level monitoring: Secondary | ICD-10-CM | POA: Diagnosis not present

## 2016-02-28 DIAGNOSIS — E1142 Type 2 diabetes mellitus with diabetic polyneuropathy: Secondary | ICD-10-CM

## 2016-02-28 MED ORDER — MOXIFLOXACIN HCL 400 MG PO TABS: 400.0000 mg | ORAL_TABLET | Freq: Every day | ORAL | 0 refills | Status: DC

## 2016-02-28 MED ORDER — GUAIFENESIN-CODEINE 100-10 MG/5ML PO SYRP: 5.0000 mL | ORAL_SOLUTION | Freq: Every evening | ORAL | 0 refills | Status: DC | PRN

## 2016-02-28 MED ORDER — PREDNISONE 10 MG PO TABS: ORAL_TABLET | ORAL | 0 refills | Status: DC

## 2016-02-28 NOTE — Progress Notes (Signed)
   Subjective:    Patient ID: [name], male    DOB: Nov 28, 1940, 76 y.o.   MRN: [id number]  HPI   76 year old male with history of afib, mild persistent asthma, COPD, DM presents with continue cough.  Saw Dr. Lorelei Pont on 12/26 for OM on right. Treated with  Cefdinir x 10 days.  On 1/10 returned for re-eval  Given worsening symptoms. Treated for bronchitis/COPD exac mild  With amoxicillin and supportive care. Was not given prednisone at that time.  Finished amox today. Today he reports continued chest tightness, cough, productive, keeping him up at night. Feels like breathing is constricted. Right face pain and headache, no longer with ear pain in right.  Now ongoing x 25 days.  Using albuterol 1-2 times daily. Helps very short-term.  Review of Systems  Constitutional: Negative for fatigue and fever.  HENT: Negative for ear pain.   Eyes: Negative for pain.  Respiratory: Positive for cough and shortness of breath.   Cardiovascular: Negative for chest pain, palpitations and leg swelling.  Gastrointestinal: Negative for abdominal pain.  Genitourinary: Negative for dysuria.  Musculoskeletal: Negative for arthralgias.  Neurological: Negative for syncope, light-headedness and headaches.  Psychiatric/Behavioral: Negative for dysphoric mood.       Objective:   Physical Exam  Constitutional: Vital signs are normal. He appears well-developed and well-nourished.  Non-toxic appearance. He does not appear ill. No distress.  HENT:  Head: Normocephalic and atraumatic.  Right Ear: Hearing, tympanic membrane, external ear and ear canal normal. No tenderness. No foreign bodies. Tympanic membrane is not retracted and not bulging.  Left Ear: Hearing, tympanic membrane, external ear and ear canal normal. No tenderness. No foreign bodies. Tympanic membrane is not retracted and not bulging.  Nose: Nose normal. No mucosal edema or rhinorrhea. Right sinus exhibits no maxillary sinus tenderness  and no frontal sinus tenderness. Left sinus exhibits no maxillary sinus tenderness and no frontal sinus tenderness.  Mouth/Throat: Uvula is midline, oropharynx is clear and moist and mucous membranes are normal. Normal dentition. No dental caries. No oropharyngeal exudate or tonsillar abscesses.  Eyes: Conjunctivae, EOM and lids are normal. Pupils are equal, round, and reactive to light. Lids are everted and swept, no foreign bodies found.  Neck: Trachea normal, normal range of motion and phonation normal. Neck supple. Carotid bruit is not present. No thyroid mass and no thyromegaly present.  Cardiovascular: Normal rate, regular rhythm, S1 normal, S2 normal, normal heart sounds, intact distal pulses and normal pulses.  Exam reveals no gallop.   No murmur heard. Pulmonary/Chest: Effort normal. No respiratory distress. He has no wheezes. He has no rhonchi. He has rales in the right lower field.  Abdominal: Soft. Normal appearance and bowel sounds are normal. There is no hepatosplenomegaly. There is no tenderness. There is no rebound, no guarding and no CVA tenderness. No hernia.  Neurological: He is alert. He has normal reflexes.  Skin: Skin is warm, dry and intact. No rash noted.  Psychiatric: He has a normal mood and affect. His speech is normal and behavior is normal. Judgment normal.          Assessment & Plan:

## 2016-02-28 NOTE — Telephone Encounter (Signed)
V/M left; pt seen last on 02/19/16. Pt will finish second round of abx 02/28/16; pt still has tightness in chest and still bothering pt a lot; request cb to pt with further instructions. [facility] [place].

## 2016-02-28 NOTE — Telephone Encounter (Signed)
Patient's wife called and said patient can't make it at 3:30, but he could make it at 4:00.  Please call her back at [phone].

## 2016-02-28 NOTE — Assessment & Plan Note (Signed)
No clear PNA or mass in lungs.  Most liekly COPD exac.. broaden antibiotics, start pred taper, albuterol as needed.

## 2016-02-28 NOTE — Assessment & Plan Note (Signed)
Follow blood sugars at home.. Call if > 200 on predinsone

## 2016-02-28 NOTE — Telephone Encounter (Signed)
Needs to be seen  ASAP and likely have CXR, possible breathing treatment.

## 2016-02-28 NOTE — Assessment & Plan Note (Signed)
ON antibiotics needs to have INR checked at coumadin clinic early next week.

## 2016-02-28 NOTE — Telephone Encounter (Signed)
Requesting surgical clearance:   1. Type of surgery: Right Wrist:CTR  2. Surgeon: Roseanne Kaufman  3. Surgical date: 03/13/2016  4. Medications that need to be help: Warfarin  5: [facility]: (P) [phone] (F) [phone]   Pt saw you on 06/06/2015, is pt cleared for surgery?

## 2016-02-28 NOTE — Telephone Encounter (Signed)
Appointment scheduled today at 3:30 pm to see Dr. Diona Browner.

## 2016-02-28 NOTE — Telephone Encounter (Signed)
Advised [name] to have [name] get here as soon as he can.

## 2016-02-28 NOTE — Patient Instructions (Addendum)
No specific pneumonia seen. Will broaden antibiotics for COPD exacerbation. Treat with prednisone taper. Follow sugar closely. Have inr checked for coumadin after few days of antibiotics.

## 2016-03-01 NOTE — Telephone Encounter (Signed)
The patient can hold his warfarin as needed.  He has no contraindication to the planned procedure.

## 2016-03-02 ENCOUNTER — Other Ambulatory Visit: Payer: Self-pay | Admitting: Gastroenterology

## 2016-03-03 NOTE — Telephone Encounter (Signed)
Made pt aware of below - has appt for Friday for INR check.

## 2016-03-04 ENCOUNTER — Other Ambulatory Visit: Payer: Self-pay | Admitting: Unknown Physician Specialty

## 2016-03-04 DIAGNOSIS — R1084 Generalized abdominal pain: Secondary | ICD-10-CM

## 2016-03-06 ENCOUNTER — Ambulatory Visit (INDEPENDENT_AMBULATORY_CARE_PROVIDER_SITE_OTHER): Payer: Medicare Other

## 2016-03-06 DIAGNOSIS — Z5181 Encounter for therapeutic drug level monitoring: Secondary | ICD-10-CM

## 2016-03-06 DIAGNOSIS — I824Y9 Acute embolism and thrombosis of unspecified deep veins of unspecified proximal lower extremity: Secondary | ICD-10-CM

## 2016-03-06 DIAGNOSIS — Z7901 Long term (current) use of anticoagulants: Secondary | ICD-10-CM | POA: Diagnosis not present

## 2016-03-06 DIAGNOSIS — I4891 Unspecified atrial fibrillation: Secondary | ICD-10-CM | POA: Diagnosis not present

## 2016-03-06 DIAGNOSIS — M25561 Pain in right knee: Secondary | ICD-10-CM | POA: Diagnosis not present

## 2016-03-06 LAB — POCT INR: INR: 3.1

## 2016-03-13 DIAGNOSIS — G5601 Carpal tunnel syndrome, right upper limb: Secondary | ICD-10-CM | POA: Diagnosis not present

## 2016-03-15 ENCOUNTER — Other Ambulatory Visit: Payer: Self-pay | Admitting: Neurology

## 2016-03-19 ENCOUNTER — Ambulatory Visit: Payer: Medicare Other

## 2016-03-20 DIAGNOSIS — M9901 Segmental and somatic dysfunction of cervical region: Secondary | ICD-10-CM | POA: Diagnosis not present

## 2016-03-20 DIAGNOSIS — M50322 Other cervical disc degeneration at C5-C6 level: Secondary | ICD-10-CM | POA: Diagnosis not present

## 2016-03-20 DIAGNOSIS — Z4789 Encounter for other orthopedic aftercare: Secondary | ICD-10-CM | POA: Diagnosis not present

## 2016-03-23 ENCOUNTER — Ambulatory Visit (INDEPENDENT_AMBULATORY_CARE_PROVIDER_SITE_OTHER): Payer: Medicare Other

## 2016-03-23 DIAGNOSIS — I824Y9 Acute embolism and thrombosis of unspecified deep veins of unspecified proximal lower extremity: Secondary | ICD-10-CM

## 2016-03-23 DIAGNOSIS — Z5181 Encounter for therapeutic drug level monitoring: Secondary | ICD-10-CM | POA: Diagnosis not present

## 2016-03-23 DIAGNOSIS — I4891 Unspecified atrial fibrillation: Secondary | ICD-10-CM

## 2016-03-23 DIAGNOSIS — Z7901 Long term (current) use of anticoagulants: Secondary | ICD-10-CM | POA: Diagnosis not present

## 2016-03-23 LAB — POCT INR: INR: 1.9

## 2016-03-27 DIAGNOSIS — M50322 Other cervical disc degeneration at C5-C6 level: Secondary | ICD-10-CM | POA: Diagnosis not present

## 2016-03-27 DIAGNOSIS — M9901 Segmental and somatic dysfunction of cervical region: Secondary | ICD-10-CM | POA: Diagnosis not present

## 2016-03-30 ENCOUNTER — Ambulatory Visit
Admission: RE | Admit: 2016-03-30 | Discharge: 2016-03-30 | Disposition: A | Discharge status: Home / Self Care | Payer: Medicare Other | Source: Ambulatory Visit | Attending: Unknown Physician Specialty | Admitting: Unknown Physician Specialty

## 2016-03-30 DIAGNOSIS — R1084 Generalized abdominal pain: Secondary | ICD-10-CM

## 2016-03-30 DIAGNOSIS — N281 Cyst of kidney, acquired: Secondary | ICD-10-CM | POA: Insufficient documentation

## 2016-03-30 DIAGNOSIS — I77811 Abdominal aortic ectasia: Secondary | ICD-10-CM | POA: Diagnosis not present

## 2016-04-06 DIAGNOSIS — M25511 Pain in right shoulder: Secondary | ICD-10-CM | POA: Diagnosis not present

## 2016-04-07 ENCOUNTER — Other Ambulatory Visit: Payer: Self-pay | Admitting: Neurology

## 2016-04-10 ENCOUNTER — Other Ambulatory Visit: Payer: Self-pay | Admitting: Family Medicine

## 2016-04-10 DIAGNOSIS — Z4789 Encounter for other orthopedic aftercare: Secondary | ICD-10-CM | POA: Diagnosis not present

## 2016-04-10 DIAGNOSIS — G5603 Carpal tunnel syndrome, bilateral upper limbs: Secondary | ICD-10-CM | POA: Diagnosis not present

## 2016-04-10 NOTE — Telephone Encounter (Signed)
[id number] [place],  - [address] Phone: [phone] 

## 2016-04-10 NOTE — Telephone Encounter (Signed)
Last office visit 02/28/2016.  Last refilled 02/18/2016 for #30 with 1 refill.  Ok to refill?

## 2016-04-13 DIAGNOSIS — M25561 Pain in right knee: Secondary | ICD-10-CM | POA: Diagnosis not present

## 2016-04-14 DIAGNOSIS — G5602 Carpal tunnel syndrome, left upper limb: Secondary | ICD-10-CM | POA: Diagnosis not present

## 2016-04-14 DIAGNOSIS — M65832 Other synovitis and tenosynovitis, left forearm: Secondary | ICD-10-CM | POA: Diagnosis not present

## 2016-04-17 ENCOUNTER — Other Ambulatory Visit: Payer: Self-pay | Admitting: Neurology

## 2016-04-20 DIAGNOSIS — Z4789 Encounter for other orthopedic aftercare: Secondary | ICD-10-CM | POA: Diagnosis not present

## 2016-04-22 ENCOUNTER — Ambulatory Visit (INDEPENDENT_AMBULATORY_CARE_PROVIDER_SITE_OTHER): Payer: Medicare Other

## 2016-04-22 DIAGNOSIS — Z5181 Encounter for therapeutic drug level monitoring: Secondary | ICD-10-CM | POA: Diagnosis not present

## 2016-04-22 DIAGNOSIS — I4891 Unspecified atrial fibrillation: Secondary | ICD-10-CM

## 2016-04-22 DIAGNOSIS — Z7901 Long term (current) use of anticoagulants: Secondary | ICD-10-CM

## 2016-04-22 DIAGNOSIS — I824Y9 Acute embolism and thrombosis of unspecified deep veins of unspecified proximal lower extremity: Secondary | ICD-10-CM | POA: Diagnosis not present

## 2016-04-22 LAB — POCT INR: INR: 1.9

## 2016-04-27 DIAGNOSIS — Z4789 Encounter for other orthopedic aftercare: Secondary | ICD-10-CM | POA: Diagnosis not present

## 2016-05-04 ENCOUNTER — Other Ambulatory Visit: Payer: Self-pay | Admitting: Neurology

## 2016-05-05 DIAGNOSIS — H5203 Hypermetropia, bilateral: Secondary | ICD-10-CM | POA: Diagnosis not present

## 2016-05-05 DIAGNOSIS — H04123 Dry eye syndrome of bilateral lacrimal glands: Secondary | ICD-10-CM | POA: Diagnosis not present

## 2016-05-06 DIAGNOSIS — M25511 Pain in right shoulder: Secondary | ICD-10-CM | POA: Diagnosis not present

## 2016-05-14 ENCOUNTER — Other Ambulatory Visit: Payer: Self-pay | Admitting: Neurology

## 2016-05-18 DIAGNOSIS — M25561 Pain in right knee: Secondary | ICD-10-CM | POA: Diagnosis not present

## 2016-05-18 DIAGNOSIS — Z4789 Encounter for other orthopedic aftercare: Secondary | ICD-10-CM | POA: Diagnosis not present

## 2016-05-18 DIAGNOSIS — G5602 Carpal tunnel syndrome, left upper limb: Secondary | ICD-10-CM | POA: Diagnosis not present

## 2016-05-18 NOTE — Progress Notes (Signed)
Subjective:    [name] was seen in consultation in the movement disorder clinic at the request of Dr. Joya Salm.  His PCP is Eliezer Lofts, MD.  This patient is accompanied in the office by his child who supplements the history. The evaluation is for tremor.  I reviewed prior records that are available to me.  Looking back in his medical record, there are notes back to 2012 that indicate severe essential tremor.  Pt reports tremor has been worse since recent surgeries.  He had a shoulder surgery for bone spurs first and then had a shoulder replacement on the L and then had a cervical fusion surgery.  Prior to that, he had gastric bypass in 2013.    The patient is a 76 y.o. right handed male with a history of tremor.  Pt reports that tremor started after an episode of sepsis about 5 years ago.  Once he left the hospital, it was in the bilateral UE, overall slight but it has gotten worse over 8 months.  He cannot eat peas or the roll off the spoon.   There is no family hx of tremor.    Affected by caffeine:  No. Affected by alcohol: doesn't drink Affected by stress:  No. Affected by fatigue:  No. Spills soup if on spoon:  Yes.   Spills glass of liquid if full:  No. Affects ADL's (tying shoes, brushing teeth, etc):  No.  Pt had neck hematomas after surgery, which caused dysphagia.  A modified barium swallow was done on 08/14/2013 indicate severe cervical face dysphagia, moderate to severe pharyngeal phase dysphagia.  There is recommended if crush his medications or takes them in liquid form.  Outpatient speech language pathology and nutrition consult was also recommended.  11/01/13 update:  Pt is returning for f/u.  Started primidone last visit.   Pt states that it has been helpful but thinks that we could go up on the medication.  No SE.  Had carpal tunnel release on the L.  States that it didn't help.  Has also gone to speech therapy for swallowing, as recommended and that did not  help.  01/02/14 update:  The patient returns for follow-up today.  He has a history of essential tremor.  Last visit, his primidone was increased 50 mg twice a day.  "I think that if you increased it one more time, we will have it just right."  I got a note from his ENT physician at [facility].  They agreed that dysphagia was related to prior cervical spine surgery.  They told him that loss of balance is not due to an inner ear pathology and he was told to follow-up here.  He does have a history of diabetic peripheral neuropathy.  Pt states that balance has been off since the neck surgery and thinks that it is related.  He states that it actually has gotten slowly better.  Balance isn't great but he isn't falling anymore either which used to be a big problem.  05/02/14 update:  His primidone was increased last visit so that he is taking 100 mg in the morning and he remains on 50 mg at night.  The patient states that tremor has tremor is doing well.  He states that tremor is well controlled.  He only spills something or notices tremor 1-2 days a month, which is markedly improved.  No SE with the tremor.    11/02/14 update:  The patient has a history of essential tremor  and is on primidone, 100 mg in the morning and 50 mg at night. He thinks that tremor is well controlled.   I reviewed records since our last visit.  He has undergone a rather extensive workup for epigastric pain and weight loss.  He is a CT of the abdomen that was negative.  His H. pylori antibodies were negative.  He had a stress Myoview and while there was some evidence of ischemia, the patient did not believe that this was cardiac related and did not want to proceed with a cath.  He has seen gastroenterology and has had an EGD.  He was started on carafate and he thinks that it is helping.  He thinks that much of it is from the gastric bypass as he has emesis after the meals.    11/27/14 update:  Pt is f/u much earlier than expected.  On  primidone, 100 mg in AM, 50 mg at night.  The records that were made available to me were reviewed since last visit.  Had to have penile prosthesis removed that malfunctioned and insert a new one. Also, had surgery for his tear ducts yesterday. His wife states that Sunday AM he was getting out of the shower and he screamed and he felt like he was getting stabbed in the back of the head.  He's had this in the past but not this severe.  It is sore to the touch behind the ear.  It was awful for 15 min (felt like on fire) but if he turns his head to the right, he still feels it.    03/22/15 update:  The patient follows up today regarding his essential tremor.  He is on primidone, 100 mg in the morning and 50 g at night.  Tremor has slightly increased with time.   Last visit, which was in October, we did an occipital nerve block for right occipital neuralgia.  This was done on 11/28/2014.  He states that it did not help and he has been having a daily headache.  He continues to have a posterior occipital headache that radiates to the frontal region but he points to the left side today.  He states that the fingers on the L thumb, pointer and middle finger are numb.  States had Lcarpal tunnel surgery a year ago but these sx's started after shoulder replacement surgery and is convinced that something happened; no neck pain but has had prior neck surgery.  The shoulder replacement surgery was done first and the CTS was done a month later.     I did review records since our last visit.  He underwent surgery for an internal hernia repair on 03/08/2015.  09/30/15 update:  The patient follows up today.  He has a history of essential tremor and last visit I slightly increased his primidone to that he is taking 100 mg both in the morning and at night.  He states that he still has tremor but that is fairly stable and not worse.  He was complaining about left-sided hand paresthesias last visit and we ended up doing an EMG on file  14th, 2017.  This was normal, without evidence of focal neuropathy or radiculopathy.  C/o pain in back of the head that will feel like he is getting hit in the head on either and then will go away within a few seconds.  Has had a few times in the last few months but always keeps a "low grade headache."   No hx  of stomach ulcer.  No diabetic meds x many months.  He is unsure why he takes the gabapentin but thinks it may be for diabetic neuropathy and gouty pain.    05/19/16 update:  Patient seen today in follow-up.  He is on primidone, 100 mg twice a day.   He had CTS by Dr. Amedeo Plenty on the L 4 weeks ago and on the R 6 weeks ago.  In regards to headache, the patient reports that they have been about the same.  He has 30 headache days per month.  They are frontal and occipital.  They can be moderate to severe but they are not that way all the time.  Remains on gabapentin, 300 mg twice per day.  Asks for refill of that.    Current/Previously tried tremor medications: no  Current medications that may exacerbate tremor:  n/a  Outside reports reviewed: historical medical records, lab reports and referral letter/letters.  Allergies  Allergen Reactions  . Sulfacetamide Sodium     REACTION: Throat swelling  . Adhesive [Tape]     Tears skin-- "No tape of any kind, they all tear skin right off" Tears skin-- "No tape of any kind, they all tear skin right off"    Current Outpatient Prescriptions on File Prior to Visit  Medication Sig Dispense Refill  . acarbose (PRECOSE) 25 MG tablet     . atorvastatin (LIPITOR) 80 MG tablet Take 1 tablet by mouth at  bedtime 90 tablet 3  . FLUoxetine (PROZAC) 40 MG capsule Take 1 capsule by mouth  daily 90 capsule 3  . furosemide (LASIX) 20 MG tablet TAKE 1 TO 2 TABLETS BY  MOUTH DAILY 180 tablet 1  . gabapentin (NEURONTIN) 300 MG capsule TAKE 1 CAPSULE (300 MG TOTAL) BY MOUTH 2 (TWO) TIMES DAILY. 60 capsule 0  . hydrocortisone 2.5 % cream APPLY ON THE SKIN TWICE A DAY  4   . lansoprazole (PREVACID) 30 MG capsule TAKE 1 CAPSULE (30 MG TOTAL) BY MOUTH 2 (TWO) TIMES DAILY.  5  . lisinopril (PRINIVIL,ZESTRIL) 2.5 MG tablet Take 2.5 mg by mouth every morning.     . moxifloxacin (AVELOX) 400 MG tablet Take 1 tablet (400 mg total) by mouth daily at 8 pm. 7 tablet 0  . nystatin cream (MYCOSTATIN)     . primidone (MYSOLINE) 50 MG tablet TAKE 2 TABLETS (100 MG TOTAL) BY MOUTH 2 (TWO) TIMES DAILY. 120 tablet 0  . ramelteon (ROZEREM) 8 MG tablet Take 1 tablet (8 mg total) by mouth at bedtime. 30 tablet 0  . ranitidine (ZANTAC) 300 MG tablet Take 1 tablet by mouth at bedtime.    . silodosin (RAPAFLO) 8 MG CAPS capsule Take 8 mg by mouth daily with breakfast.    . sucralfate (CARAFATE) 1 g tablet     . tamsulosin (FLOMAX) 0.4 MG CAPS capsule Take 0.4 mg by mouth daily.  3  . traMADol (ULTRAM) 50 MG tablet     . traZODone (DESYREL) 50 MG tablet Take 0.5-1 tablets (25-50 mg total) by mouth at bedtime as needed for sleep. 30 tablet 3  . warfarin (COUMADIN) 2.5 MG tablet TAKE 1 TO 1.5 TABLETS BY  MOUTH DAILY OR AS DIRECTED  BY COUMADIN CLINIC 135 tablet 1  . zolpidem (AMBIEN) 10 MG tablet TAKE 1 TABLET BY MOUTH AT BEDTIME AS NEEDED FOR SLEEP 30 tablet 1   No current facility-administered medications on file prior to visit.     Past Medical History:  Diagnosis  Date  . Anxiety state, unspecified   . Arthritis    hands   . Bruises easily    pt is on Coumadin  . Cancer ([place])    SKIN CANCER REMOVED  . Chronic atrial fibrillation (HCC)    a.fib on warfarin- Dr. Percival Spanish follows  . Depressive disorder, not elsewhere classified    takes Prozac daily  . Diverticulosis of colon (without mention of hemorrhage)   . ED (erectile dysfunction)   . GERD (gastroesophageal reflux disease)   . Hard of hearing    wears hearing aids  . History of blood clots    behind right knee and then went into left lung 28yrs ago  . History of colon polyps   . History of kidney stones   . Hx of  diabetes mellitus    "no longer diabetic since gastric bypass" - no longer taking metformin"  in 2 months "problems with low blood sugar now"  . Hx of gout    but doesn't take any meds  . Inflammation of colonic mucosa    recent admission and release from [facility] 02-28-15-remains on oral antibiotic  . Joint pain   . Peripheral edema    takes Furosemide daily  . Peripheral vascular disease ([place])   . Pneumonia    last time about 49yrs ago  . Pulmonary embolism ([place])    over 10 yrs ago "many years ago"  . Restless legs syndrome (RLS)   . Unspecified asthma(493.90)    as a child  . Unspecified essential hypertension    hx of no longer on medication due to gastric bypass     Past Surgical History:  Procedure Laterality Date  . ANTERIOR CERVICAL DECOMP/DISCECTOMY FUSION N/A 05/09/2013   Procedure: ANTERIOR CERVICAL DECOMPRESSION/DISCECTOMY FUSION CERVICAL THREE-FOUR,CERVICAL FOUR-FIVE,CERVICAL FIVE-SIX;  Surgeon: Floyce Stakes, MD;  Location: [facility];  Service: Neurosurgery;  Laterality: N/A;  . ARTERY REPAIR     Left forearm  . BACK SURGERY     x 3  . BREATH TEK H PYLORI  01/08/2011   Procedure: BREATH TEK H PYLORI;  Surgeon: Pedro Earls, MD;  Location: [facility];  Service: General;  Laterality: N/A;  . CARDIOVERSION     x 2 attempts-unsuccessful.  [name] CATARACT EXTRACTION, BILATERAL    . COLONOSCOPY    . CYSTOSCOPY    . ESOPHAGOGASTRODUODENOSCOPY (EGD) WITH PROPOFOL N/A 09/21/2014   Procedure: ESOPHAGOGASTRODUODENOSCOPY (EGD) WITH PROPOFOL;  Surgeon: Manya Silvas, MD;  Location: [facility];  Service: Endoscopy;  Laterality: N/A;  . EYE SURGERY     cataract bil  . FOOT SURGERY     Left foot   . GASTRIC ROUX-EN-Y  08/11/2011   Procedure: LAPAROSCOPIC ROUX-EN-Y GASTRIC BYPASS WITH UPPER ENDOSCOPY;  Surgeon: Pedro Earls, MD;  Location: [facility];  Service: General;  Laterality: N/A;  . HAND SURGERY     LEFT  . injections in back     x 18  . KNEE SURGERY Right      x 4  . KNEE SURGERY Left    arthroscopy  . LAPAROSCOPIC INTERNAL HERNIA REPAIR N/A 03/08/2015   Procedure: LAPAROSCOPIC INTERNAL HERNIA REPAIR ;  Surgeon: [name], MD;  Location: [facility];  Service: General;  Laterality: N/A;  . LEG SURGERY     FOR NECROTIZING FASCITIS L LEG AND GROIN  . PENILE PROSTHESIS IMPLANT N/A 07/02/2014   Procedure: PENILE PROTHESIS INFLATABLE 3 PIECE ([place]) SCROTAL APPROACH;  Surgeon: Kathie Rhodes, MD;  Location: [facility];  Service: Urology;  Laterality: N/A;  . PENILE PROSTHESIS IMPLANT N/A 11/23/2014   Procedure: EXPLORATION AND REVISION OF PENILE PROSTHESIS;  Surgeon: Kathie Rhodes, MD;  Location: [facility];  Service: Urology;  Laterality: N/A;  . REPLACEMENT TOTAL KNEE Left    x 2  . SHOULDER ARTHROSCOPY    . SHOULDER SURGERY Left 2014  . TONSILLECTOMY    . TOTAL SHOULDER ARTHROPLASTY Left 01/26/2013   Procedure: TOTAL SHOULDER ARTHROPLASTY;  Surgeon: Nita Sells, MD;  Location: [facility];  Service: Orthopedics;  Laterality: Left;  Left total shoulder arthroplasty    Social History   Social History  . Marital status: Married    Spouse name: N/A  . Number of children: 1  . Years of education: N/A   Occupational History  . Retired     Administrator   Social History Main Topics  . Smoking status: Never Smoker  . Smokeless tobacco: Never Used  . Alcohol use No  . Drug use: No  . Sexual activity: Yes   Other Topics Concern  . Not on file   Social History Narrative   DIET: 3 meals, F&V, some water, crystal light, No fast food   Exercise: walks treadmill 30 min      1 Caffeine drinks daily       No living will.           Family Status  Relation Status  . Mother Deceased   breast/uterine cancer  . Father Deceased   emphysema, heart disease  . Sister Deceased   alzheimer's disease  . Brother Deceased   struck by lightening  . Brother Deceased   colon cancer  . Brother Deceased   heart disease  . Brother Alive    prostate cancer  . Daughter Alive   healthy    Review of Systems   A complete 10 system ROS was obtained and was negative apart from what is mentioned.   Objective:   VITALS:   Vitals:   05/19/16 0806  BP: 110/68  Pulse: 64  Weight: 193 lb (87.5 kg)  Height: 6\' 2"  (1.88 m)   Wt Readings from Last 3 Encounters:  05/19/16 193 lb (87.5 kg)  02/04/16 191 lb 6.4 oz (86.8 kg)  09/30/15 190 lb (86.2 kg)     Gen:  Appears stated age and in NAD. HEENT:  Normocephalic, atraumatic. The mucous membranes are moist. The superficial temporal arteries are without ropiness or tenderness. CV:  RRR Lungs: CTAB Neck:  No bruits. There is significant tenderness over the right occipital notch   NEUROLOGICAL:  Orientation:  The patient is alert and oriented x 3.  Recent and remote memory are intact.  Attention span and concentration are normal.  Able to name objects and repeat without trouble.  Fund of knowledge is appropriate Cranial nerves: There is good facial symmetry, but there is L ptosis (I looked at 1940 drivers license and it was the same). Extraocular muscles are intact and visual fields are full to confrontational testing. Speech is fluent and clear. Soft palate rises symmetrically and there is no tongue deviation. Hearing is intact to conversational tone. Tone: Tone is good throughout.  Mild gegenhalten. Sensation: Sensation is intact to light touch throughout.  Hands are cold bilaterally. Coordination:  The patient has no dysdiadichokinesia or dysmetria. Motor: Strength is 5/5 in the bilateral upper and lower extremities.  Shoulder shrug is equal bilaterally.  There is no pronator drift.   Gait and Station: The  patient ambulates well today  MOVEMENT EXAM: Tremor:  There is no tremor noted today.  LABS    Chemistry      Component Value Date/Time   NA 141 06/05/2015 0900   K 4.7 06/05/2015 0900   CL 109 06/05/2015 0900   CO2 26 06/05/2015 0900   BUN 19 06/05/2015 0900    CREATININE 1.55 (H) 06/05/2015 0900      Component Value Date/Time   CALCIUM 8.8 06/05/2015 0900   ALKPHOS 74 06/05/2015 0900   AST 30 06/05/2015 0900   ALT 42 06/05/2015 0900   BILITOT 0.4 06/05/2015 0900          Assessment/Plan:   1.  Essential Tremor.  -improved on primidone.  Continue with two in the AM (100 mg) and 100mg  at night.  Risks, benefits, side effects and alternative therapies were discussed.  The opportunity to ask questions was given and they were answered to the best of my ability.  The patient expressed understanding and willingness to follow the outlined treatment protocols.  Refills provided today 2.  Gait instability  -It is believed that ET alone can cause ataxia/balance changes due to changes in the cerebellar purkinje cell/climbing fiber synaptic transmission.   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  -looked better in this regard today 3.  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  Pt wanted lid lift but ophthalmology didn't want to proceed  5.  Headache  -no topamax due to hx of nephrolithiasis.   -gabapentin not providing enough relief.  Still having headache.  Wants a refill and provided.  300 mg bid.  -talked to him about botox as he has chronic migraine.  Has 30 days of headache/month.   They are mod in nature.  He will think about it.    6.  Follow up is anticipated in the next few months, sooner should new neurologic issues arise. Greater than 50% of 25 min visit in counseling.

## 2016-05-19 ENCOUNTER — Encounter: Payer: Self-pay | Admitting: Neurology

## 2016-05-19 ENCOUNTER — Ambulatory Visit (INDEPENDENT_AMBULATORY_CARE_PROVIDER_SITE_OTHER): Payer: Medicare Other

## 2016-05-19 ENCOUNTER — Ambulatory Visit (INDEPENDENT_AMBULATORY_CARE_PROVIDER_SITE_OTHER): Payer: Medicare Other | Admitting: Neurology

## 2016-05-19 VITALS — BP 110/68 | HR 64 | Ht 74.0 in | Wt 193.0 lb

## 2016-05-19 DIAGNOSIS — Z7901 Long term (current) use of anticoagulants: Secondary | ICD-10-CM

## 2016-05-19 DIAGNOSIS — I824Y9 Acute embolism and thrombosis of unspecified deep veins of unspecified proximal lower extremity: Secondary | ICD-10-CM

## 2016-05-19 DIAGNOSIS — G250 Essential tremor: Secondary | ICD-10-CM

## 2016-05-19 DIAGNOSIS — Z5181 Encounter for therapeutic drug level monitoring: Secondary | ICD-10-CM

## 2016-05-19 DIAGNOSIS — IMO0002 Reserved for concepts with insufficient information to code with codable children: Secondary | ICD-10-CM

## 2016-05-19 DIAGNOSIS — G43709 Chronic migraine without aura, not intractable, without status migrainosus: Secondary | ICD-10-CM | POA: Diagnosis not present

## 2016-05-19 DIAGNOSIS — I4891 Unspecified atrial fibrillation: Secondary | ICD-10-CM | POA: Diagnosis not present

## 2016-05-19 LAB — POCT INR: INR: 3

## 2016-05-19 MED ORDER — PRIMIDONE 50 MG PO TABS: 100.0000 mg | ORAL_TABLET | Freq: Two times a day (BID) | ORAL | 1 refills | Status: DC

## 2016-05-19 MED ORDER — GABAPENTIN 300 MG PO CAPS: 300.0000 mg | ORAL_CAPSULE | Freq: Two times a day (BID) | ORAL | 1 refills | Status: DC

## 2016-05-20 DIAGNOSIS — M50322 Other cervical disc degeneration at C5-C6 level: Secondary | ICD-10-CM | POA: Diagnosis not present

## 2016-05-20 DIAGNOSIS — M9901 Segmental and somatic dysfunction of cervical region: Secondary | ICD-10-CM | POA: Diagnosis not present

## 2016-05-21 DIAGNOSIS — M9901 Segmental and somatic dysfunction of cervical region: Secondary | ICD-10-CM | POA: Diagnosis not present

## 2016-05-21 DIAGNOSIS — M50322 Other cervical disc degeneration at C5-C6 level: Secondary | ICD-10-CM | POA: Diagnosis not present

## 2016-06-08 ENCOUNTER — Other Ambulatory Visit: Payer: Self-pay | Admitting: Neurology

## 2016-06-08 MED ORDER — GABAPENTIN 300 MG PO CAPS: 300.0000 mg | ORAL_CAPSULE | Freq: Two times a day (BID) | ORAL | 1 refills | Status: DC

## 2016-06-13 ENCOUNTER — Other Ambulatory Visit: Payer: Self-pay | Admitting: Family Medicine

## 2016-06-13 NOTE — Telephone Encounter (Signed)
Last office visit 02/28/16.  Last refilled 04/10/16 for #30 with 1 refill.  Ok to refill?

## 2016-06-15 DIAGNOSIS — L570 Actinic keratosis: Secondary | ICD-10-CM | POA: Diagnosis not present

## 2016-06-15 DIAGNOSIS — G5602 Carpal tunnel syndrome, left upper limb: Secondary | ICD-10-CM | POA: Diagnosis not present

## 2016-06-15 DIAGNOSIS — Z4789 Encounter for other orthopedic aftercare: Secondary | ICD-10-CM | POA: Diagnosis not present

## 2016-06-15 DIAGNOSIS — D485 Neoplasm of uncertain behavior of skin: Secondary | ICD-10-CM | POA: Diagnosis not present

## 2016-06-15 DIAGNOSIS — M25561 Pain in right knee: Secondary | ICD-10-CM | POA: Diagnosis not present

## 2016-06-15 DIAGNOSIS — L821 Other seborrheic keratosis: Secondary | ICD-10-CM | POA: Diagnosis not present

## 2016-06-15 DIAGNOSIS — Z85828 Personal history of other malignant neoplasm of skin: Secondary | ICD-10-CM | POA: Diagnosis not present

## 2016-06-16 ENCOUNTER — Telehealth: Payer: Self-pay | Admitting: *Deleted

## 2016-06-16 NOTE — Telephone Encounter (Signed)
[facility]em 10 mg.  PA completed on CoverMyMeds.  Sent for review.  Can take up to 72 hours for a decision.

## 2016-06-16 NOTE — Telephone Encounter (Signed)
[facility]em [id number] [place], [place] - [address] Phone: [phone] 

## 2016-06-17 NOTE — Telephone Encounter (Signed)
PA approved for 1 year through 06/16/2017.   [facility] notified of [facility] via fax.

## 2016-06-22 ENCOUNTER — Ambulatory Visit (INDEPENDENT_AMBULATORY_CARE_PROVIDER_SITE_OTHER): Payer: Medicare Other | Admitting: Pharmacist

## 2016-06-22 DIAGNOSIS — Z5181 Encounter for therapeutic drug level monitoring: Secondary | ICD-10-CM

## 2016-06-22 DIAGNOSIS — Z7901 Long term (current) use of anticoagulants: Secondary | ICD-10-CM

## 2016-06-22 DIAGNOSIS — I824Y9 Acute embolism and thrombosis of unspecified deep veins of unspecified proximal lower extremity: Secondary | ICD-10-CM

## 2016-06-22 DIAGNOSIS — I4891 Unspecified atrial fibrillation: Secondary | ICD-10-CM

## 2016-06-22 LAB — POCT INR: INR: 3.1

## 2016-07-17 ENCOUNTER — Ambulatory Visit
Admission: RE | Admit: 2016-07-17 | Payer: Medicare Other | Source: Ambulatory Visit | Admitting: Unknown Physician Specialty

## 2016-07-17 ENCOUNTER — Encounter: Admission: RE | Payer: Self-pay | Source: Ambulatory Visit

## 2016-07-17 DIAGNOSIS — Z85828 Personal history of other malignant neoplasm of skin: Secondary | ICD-10-CM | POA: Diagnosis not present

## 2016-07-17 DIAGNOSIS — L570 Actinic keratosis: Secondary | ICD-10-CM | POA: Diagnosis not present

## 2016-07-17 DIAGNOSIS — L820 Inflamed seborrheic keratosis: Secondary | ICD-10-CM | POA: Diagnosis not present

## 2016-07-17 DIAGNOSIS — L738 Other specified follicular disorders: Secondary | ICD-10-CM | POA: Diagnosis not present

## 2016-07-17 DIAGNOSIS — L298 Other pruritus: Secondary | ICD-10-CM | POA: Diagnosis not present

## 2016-07-17 SURGERY — COLONOSCOPY WITH PROPOFOL
Anesthesia: General

## 2016-07-27 DIAGNOSIS — Z4789 Encounter for other orthopedic aftercare: Secondary | ICD-10-CM | POA: Diagnosis not present

## 2016-07-27 DIAGNOSIS — G5603 Carpal tunnel syndrome, bilateral upper limbs: Secondary | ICD-10-CM | POA: Diagnosis not present

## 2016-08-03 DIAGNOSIS — M25561 Pain in right knee: Secondary | ICD-10-CM | POA: Diagnosis not present

## 2016-08-11 ENCOUNTER — Other Ambulatory Visit: Payer: Self-pay | Admitting: Family Medicine

## 2016-08-13 NOTE — Telephone Encounter (Signed)
Last office visit 02/28/2016.  Last refilled 06/16/2016 for #30 with 1 refill.  Ok to refill?

## 2016-08-14 NOTE — Telephone Encounter (Signed)
[facility]em called into [id number] [place], [place] - [address] Phone: [phone] 

## 2016-08-19 NOTE — Progress Notes (Addendum)
02-17-16 (EPIC) CXR ,  02-28-16 Last office visit, and labs on chart

## 2016-08-21 ENCOUNTER — Encounter (HOSPITAL_COMMUNITY): Payer: Self-pay

## 2016-08-21 ENCOUNTER — Encounter (HOSPITAL_COMMUNITY)
Admission: RE | Admit: 2016-08-21 | Discharge: 2016-08-21 | Disposition: A | Discharge status: Home / Self Care | Payer: Medicare Other | Source: Ambulatory Visit | Attending: Surgery | Admitting: Surgery

## 2016-08-21 DIAGNOSIS — R9431 Abnormal electrocardiogram [ECG] [EKG]: Secondary | ICD-10-CM | POA: Insufficient documentation

## 2016-08-21 DIAGNOSIS — Z01812 Encounter for preprocedural laboratory examination: Secondary | ICD-10-CM | POA: Diagnosis not present

## 2016-08-21 DIAGNOSIS — Z01810 Encounter for preprocedural cardiovascular examination: Secondary | ICD-10-CM | POA: Diagnosis not present

## 2016-08-21 LAB — BASIC METABOLIC PANEL
Anion gap: 7 (ref 5–15)
BUN: 20 mg/dL (ref 6–20)
CALCIUM: 8.6 mg/dL — AB (ref 8.9–10.3)
CHLORIDE: 109 mmol/L (ref 101–111)
CO2: 24 mmol/L (ref 22–32)
CREATININE: 1.48 mg/dL — AB (ref 0.61–1.24)
GFR calc non Af Amer: 45 mL/min — ABNORMAL LOW (ref >60)
GFR, EST AFRICAN AMERICAN: 52 mL/min — AB (ref >60)
Glucose, Bld: 133 mg/dL — ABNORMAL HIGH (ref 65–99)
Potassium: 4.5 mmol/L (ref 3.5–5.1)
SODIUM: 140 mmol/L (ref 135–145)

## 2016-08-21 LAB — GLUCOSE, CAPILLARY: GLUCOSE-CAPILLARY: 153 mg/dL — AB (ref 65–99)

## 2016-08-21 LAB — CBC
HCT: 37.6 % — ABNORMAL LOW (ref 39.0–52.0)
Hemoglobin: 12.6 g/dL — ABNORMAL LOW (ref 13.0–17.0)
MCH: 31.9 pg (ref 26.0–34.0)
MCHC: 33.5 g/dL (ref 30.0–36.0)
MCV: 95.2 fL (ref 78.0–100.0)
PLATELETS: 188 10*3/uL (ref 150–400)
RBC: 3.95 MIL/uL — AB (ref 4.22–5.81)
RDW: 13.8 % (ref 11.5–15.5)
WBC: 7.7 10*3/uL (ref 4.0–10.5)

## 2016-08-21 NOTE — Patient Instructions (Signed)
IMRAN NUON  08/21/2016   Your procedure is scheduled on: 08-25-16  Report to [facility] Main  Entrance Take [place]  Elevators to 3rd floor to  [place] at 5:30 AM.    Call this number if you have problems the morning of surgery [phone]    Remember: ONLY 1 PERSON MAY GO WITH YOU TO SHORT STAY TO GET  READY MORNING OF [place].  Do not eat food or drink liquids :After Midnight.     Take these medicines the morning of surgery with A SIP OF WATER: Pt prefers to not take any medication the day of surgery    DO NOT TAKE ANY DIABETIC MEDICATIONS DAY OF YOUR SURGERY                               You may not have any metal on your body including hair pins and              piercings  Do not wear jewelry, lotions, powders, deodorant                         Men may shave face and neck.   Do not bring valuables to the hospital. [place].  Contacts, dentures or bridgework may not be worn into surgery.  Leave suitcase in the car. After surgery it may be brought to your room.                  Please read over the following fact sheets you were given: _____________________________________________________________________  How to Manage Your Diabetes Before and After Surgery  Why is it important to control my blood sugar before and after surgery? . Improving blood sugar levels before and after surgery helps healing and can limit problems. . A way of improving blood sugar control is eating a healthy diet by: o  Eating less sugar and carbohydrates o  Increasing activity/exercise o  Talking with your doctor about reaching your blood sugar goals . High blood sugars (greater than 180 mg/dL) can raise your risk of infections and slow your recovery, so you will need to focus on controlling your diabetes during the weeks before surgery. . Make sure that the doctor who takes care of your diabetes knows  about your planned surgery including the date and location.  How do I manage my blood sugar before surgery? . Check your blood sugar at least 4 times a day, starting 2 days before surgery, to make sure that the level is not too high or low. o Check your blood sugar the morning of your surgery when you wake up and every 2 hours until you get to the Short Stay unit. . If your blood sugar is less than 70 mg/dL, you will need to treat for low blood sugar: o Do not take insulin. o Treat a low blood sugar (less than 70 mg/dL) with  cup of clear juice (cranberry or apple), 4 glucose tablets, OR glucose gel. o Recheck blood sugar in 15 minutes after treatment (to make sure it is greater than 70 mg/dL). If your blood sugar is not greater than 70 mg/dL on recheck, call [phone] for further instructions. . Report your  blood sugar to the short stay nurse when you get to Short Stay.  . If you are admitted to the hospital after surgery: o Your blood sugar will be checked by the staff and you will probably be given insulin after surgery (instead of oral diabetes medicines) to make sure you have good blood sugar levels. o The goal for blood sugar control after surgery is 80-180 mg/dL.   WHAT DO I DO ABOUT MY DIABETES MEDICATION?  [name] Do not take oral diabetes medicines (pills) the morning of surgery.  . THE DAY BEFORE SURGERY, take your usual dose of [place] - Preparing for Surgery Before surgery, you can play an important role.  Because skin is not sterile, your skin needs to be as free of germs as possible.  You can reduce the number of germs on your skin by washing with CHG (chlorahexidine gluconate) soap before surgery.  CHG is an antiseptic cleaner which kills germs and bonds with the skin to continue killing germs even after washing. Please DO NOT use if you have an allergy to CHG or antibacterial soaps.  If your skin becomes reddened/irritated stop using the CHG and  inform your nurse when you arrive at Short Stay. Do not shave (including legs and underarms) for at least 48 hours prior to the first CHG shower.  You may shave your face/neck. Please follow these instructions carefully:  1.  Shower with CHG Soap the night before surgery and the  morning of Surgery.  2.  If you choose to wash your hair, wash your hair first as usual with your  normal  shampoo.  3.  After you shampoo, rinse your hair and body thoroughly to remove the  shampoo.                           4.  Use CHG as you would any other liquid soap.  You can apply chg directly  to the skin and wash                       Gently with a scrungie or clean washcloth.  5.  Apply the CHG Soap to your body ONLY FROM THE NECK DOWN.   Do not use on face/ open                           Wound or open sores. Avoid contact with eyes, ears mouth and genitals (private parts).                       Wash face,  Genitals (private parts) with your normal soap.             6.  Wash thoroughly, paying special attention to the area where your surgery  will be performed.  7.  Thoroughly rinse your body with warm water from the neck down.  8.  DO NOT shower/wash with your normal soap after using and rinsing off  the CHG Soap.                9.  Pat yourself dry with a clean towel.            10.  Wear clean pajamas.            11.  Place  clean sheets on your bed the night of your first shower and do not  sleep with pets. Day of Surgery : Do not apply any lotions/deodorants the morning of surgery.  Please wear clean clothes to the hospital/surgery center.  FAILURE TO FOLLOW THESE INSTRUCTIONS MAY RESULT IN THE CANCELLATION OF YOUR SURGERY PATIENT SIGNATURE_________________________________  NURSE SIGNATURE__________________________________  ________________________________________________________________________

## 2016-08-21 NOTE — Progress Notes (Signed)
08-21-16 Pt stated at PAT appt that he stopped taking Coumadin on 08-19-16.

## 2016-08-22 LAB — HEMOGLOBIN A1C
Hgb A1c MFr Bld: 6.7 % — ABNORMAL HIGH (ref 4.8–5.6)
Mean Plasma Glucose: 146 mg/dL

## 2016-08-24 ENCOUNTER — Encounter (HOSPITAL_COMMUNITY): Payer: Self-pay | Admitting: Anesthesiology

## 2016-08-24 ENCOUNTER — Ambulatory Visit: Payer: Self-pay | Admitting: Surgery

## 2016-08-24 NOTE — Anesthesia Preprocedure Evaluation (Addendum)
Anesthesia Evaluation  Patient identified by MRN, date of birth, ID band Patient awake    Reviewed: Allergy & Precautions, NPO status , Patient's Chart, lab work & pertinent test results  Airway Mallampati: I       Dental  (+) Edentulous Upper, Edentulous Lower   Pulmonary neg pulmonary ROS, asthma , COPD,    Pulmonary exam normal        Cardiovascular hypertension, Pt. on medications + Peripheral Vascular Disease  negative cardio ROS   Rhythm:Irregular Rate:Normal     Neuro/Psych PSYCHIATRIC DISORDERS Anxiety Depression  Neuromuscular disease    GI/Hepatic Neg liver ROS, GERD  Medicated,  Endo/Other  diabetes  Renal/GU Renal disease     Musculoskeletal  (+) Arthritis ,   Abdominal   Peds  Hematology negative hematology ROS (+)   Anesthesia Other Findings Laryngoscope Size: Miller and 3 Grade View: Grade I Tube type: Oral Tube size: 8.0 mm Number of attempts: 1  Reproductive/Obstetrics                            Anesthesia Physical Anesthesia Plan  ASA: III  Anesthesia Plan: General   Post-op Pain Management:    Induction: Intravenous  PONV Risk Score and Plan: 3 and Ondansetron, Dexamethasone, Propofol, Midazolam and Treatment may vary due to age or medical condition  Airway Management Planned: Oral ETT  Additional Equipment:   Intra-op Plan:   Post-operative Plan: Extubation in OR  Informed Consent: I have reviewed the patients History and Physical, chart, labs and discussed the procedure including the risks, benefits and alternatives for the proposed anesthesia with the patient or authorized representative who has indicated his/her understanding and acceptance.   Dental advisory given  Plan Discussed with:   Anesthesia Plan Comments:        Anesthesia Quick Evaluation

## 2016-08-24 NOTE — H&P (Signed)
Chief Complaint:  panniculitis  History of Present Illness:  [name] is an 76 y.o. male who had a roux en Y gastric bypass and lost 100 lbs and had his DM abate.  The redundant abdominal skin has proved to be a problem with panniculitis.    Past Medical History:  Diagnosis Date  . Anxiety state, unspecified   . Arthritis    hands   . Bruises easily    pt is on Coumadin  . Cancer ([place])    SKIN CANCER REMOVED  . Chronic atrial fibrillation (HCC)    a.fib on warfarin- Dr. Percival Spanish follows  . Depressive disorder, not elsewhere classified    takes Prozac daily  . Diverticulosis of colon (without mention of hemorrhage)   . ED (erectile dysfunction)   . GERD (gastroesophageal reflux disease)   . Hard of hearing    wears hearing aids  . History of blood clots    behind right knee and then went into left lung 31yrs ago  . History of colon polyps   . History of kidney stones   . Hx of diabetes mellitus    "no longer diabetic since gastric bypass" - no longer taking metformin"  in 2 months "problems with low blood sugar now"  . Hx of gout    but doesn't take any meds  . Inflammation of colonic mucosa    recent admission and [facility] from [facility] 02[facility]28-15-remains on oral antibiotic  . Joint pain   . Peripheral edema    takes Furosemide daily  . Peripheral vascular disease ([place])   . Pneumonia    last time about 52yrs ago  . Pulmonary embolism ([place])    over 10 yrs ago "many years ago"  . Restless legs syndrome (RLS)   . Unspecified asthma(493.90)    as a child  . Unspecified essential hypertension    hx of no longer on medication due to gastric bypass     Past Surgical History:  Procedure Laterality Date  . ANTERIOR CERVICAL DECOMP/DISCECTOMY FUSION N/A 05/09/2013   Procedure: ANTERIOR CERVICAL DECOMPRESSION/DISCECTOMY FUSION CERVICAL THREE-FOUR,CERVICAL FOUR-FIVE,CERVICAL FIVE-SIX;  Surgeon: Floyce Stakes, MD;  Location: [facility] NEURO ORS;  Service: Neurosurgery;  Laterality:  N/A;  . ARTERY REPAIR     Left forearm  . BACK SURGERY     x 3  . BREATH TEK H PYLORI  01/08/2011   Procedure: BREATH TEK H PYLORI;  Surgeon: Pedro Earls, MD;  Location: [facility];  Service: General;  Laterality: N/A;  . CARDIOVERSION     x 2 attempts-unsuccessful.  [name] CATARACT EXTRACTION, BILATERAL    . COLONOSCOPY    . CYSTOSCOPY    . ESOPHAGOGASTRODUODENOSCOPY (EGD) WITH PROPOFOL N/A 09/21/2014   Procedure: ESOPHAGOGASTRODUODENOSCOPY (EGD) WITH PROPOFOL;  Surgeon: Manya Silvas, MD;  Location: [facility] ENDOSCOPY;  Service: Endoscopy;  Laterality: N/A;  . EYE SURGERY     cataract bil  . FOOT SURGERY     Left foot   . GASTRIC ROUX-EN-Y  08/11/2011   Procedure: LAPAROSCOPIC ROUX-EN-Y GASTRIC BYPASS WITH UPPER ENDOSCOPY;  Surgeon: Pedro Earls, MD;  Location: [facility] ORS;  Service: General;  Laterality: N/A;  . HAND SURGERY     LEFT  . injections in back     x 18  . KNEE SURGERY Right    x 4  . KNEE SURGERY Left    arthroscopy  . LAPAROSCOPIC INTERNAL HERNIA REPAIR N/A 03/08/2015   Procedure: LAPAROSCOPIC INTERNAL HERNIA REPAIR ;  Surgeon:  [name], MD;  Location: [facility] ORS;  Service: General;  Laterality: N/A;  . LEG SURGERY     FOR NECROTIZING FASCITIS L LEG AND GROIN  . PENILE PROSTHESIS IMPLANT N/A 07/02/2014   Procedure: PENILE PROTHESIS INFLATABLE 3 PIECE (COLOPLAST) SCROTAL APPROACH;  Surgeon: Kathie Rhodes, MD;  Location: [facility] ORS;  Service: Urology;  Laterality: N/A;  . PENILE PROSTHESIS IMPLANT N/A 11/23/2014   Procedure: EXPLORATION AND REVISION OF PENILE PROSTHESIS;  Surgeon: Kathie Rhodes, MD;  Location: [facility] ORS;  Service: Urology;  Laterality: N/A;  . REPLACEMENT TOTAL KNEE Left    x 2  . SHOULDER ARTHROSCOPY    . SHOULDER SURGERY Left 2014  . TONSILLECTOMY    . TOTAL SHOULDER ARTHROPLASTY Left 01/26/2013   Procedure: TOTAL SHOULDER ARTHROPLASTY;  Surgeon: Nita Sells, MD;  Location: [facility];  Service: Orthopedics;  Laterality: Left;  Left total shoulder  arthroplasty    Current Outpatient Prescriptions  Medication Sig Dispense Refill  . acarbose (PRECOSE) 25 MG tablet Take 25 mg by mouth 2 (two) times daily.     [name] acetaminophen (TYLENOL) 500 MG tablet Take 1,000 mg by mouth every 6 (six) hours as needed (for pain.).    [name] atorvastatin (LIPITOR) 80 MG tablet Take 1 tablet by mouth at  bedtime 90 tablet 3  . FLUoxetine (PROZAC) 40 MG capsule Take 1 capsule by mouth  daily 90 capsule 3  . furosemide (LASIX) 20 MG tablet TAKE 1 TO 2 TABLETS BY  MOUTH DAILY (Patient taking differently: TAKE 2 TABLETS (40 MG)  BY MOUTH DAILY) 180 tablet 1  . gabapentin (NEURONTIN) 300 MG capsule Take 1 capsule (300 mg total) by mouth 2 (two) times daily. 180 capsule 1  . hydrocortisone 2.5 % cream APPLY ON THE SKIN TWICE A DAILY TO GROINS, ARMS, AND SIDES FOR RASH  4  . L-Methylfolate-Algae-B12-B6 (METANX) 3-90.314-2-35 MG CAPS Take 1 capsule by mouth 2 (two) times daily.    . lansoprazole (PREVACID) 30 MG capsule TAKE 1 CAPSULE (30 MG TOTAL) BY MOUTH DAILY BEFORE BREAKFAST.  5  . lidocaine (XYLOCAINE) 5 % ointment Apply 1 application topically daily. APPLIED TO AFFECTED AREA OF FOOT  2  . lisinopril (PRINIVIL,ZESTRIL) 2.5 MG tablet Take 2.5 mg by mouth daily.     [name] moxifloxacin (AVELOX) 400 MG tablet Take 1 tablet (400 mg total) by mouth daily at 8 pm. (Patient not taking: Reported on 08/18/2016) 7 tablet 0  . NONFORMULARY OR COMPOUNDED ITEM Apply 1 application topically daily. APPLY TO AFFECTED AREAS DAILY (DO NOT APPLY TO FACE, GROIN, UNDERARMS, OR NECK)  2  . nystatin cream (MYCOSTATIN) Apply 1 application topically daily. APPLIED AFTER BATHING    . omeprazole (PRILOSEC) 40 MG capsule Take 40 mg by mouth at bedtime.    . primidone (MYSOLINE) 50 MG tablet Take 2 tablets (100 mg total) by mouth 2 (two) times daily. 360 tablet 1  . ranitidine (ZANTAC) 300 MG tablet Take 300 mg by mouth at bedtime.     . silodosin (RAPAFLO) 8 MG CAPS capsule Take 8 mg by mouth daily  with breakfast.     . sucralfate (CARAFATE) 1 g tablet Take 1 g by mouth daily before breakfast.     . tamsulosin (FLOMAX) 0.4 MG CAPS capsule Take 0.4 mg by mouth every evening.   3  . warfarin (COUMADIN) 2.5 MG tablet TAKE 1 TO 1.5 TABLETS BY  MOUTH DAILY OR AS DIRECTED  BY COUMADIN CLINIC (Patient taking differently: TAKE 1  TABLET (2.5 MG) ON SUNDAY, [place]. TAKE 1.5 TABLETS (3.75 MG) BY MOUTH ON MONDAY, WEDNESDAY, FRIDAY, & SATURDAY.) 135 tablet 1  . zolpidem (AMBIEN) 10 MG tablet TAKE 1 TABLET AT BEDTIME AS NEEDED FOR SLEEP (Patient taking differently: TAKE 1 TABLET (10 MG) AT BEDTIME NIGHTLY) 30 tablet 1   No current facility-administered medications for this visit.    Sulfacetamide sodium; Adhesive [tape]; and Latex Family History  Problem Relation Age of Onset  . Uterine cancer Mother        mets  . Breast cancer Mother   . Cancer Mother        cervical  . Emphysema Father   . Alzheimer's disease Sister   . Prostate cancer Brother   . Heart attack Brother   . Colon cancer Brother 88   Social History:   reports that he has never smoked. He has never used smokeless tobacco. He reports that he does not drink alcohol or use drugs.   REVIEW OF SYSTEMS : Negative except for see problem list  Physical Exam:   There were no vitals taken for this visit. There is no height or weight on file to calculate BMI.  Gen:  WDWN WM NAD  Neurological: Alert and oriented to person, place, and time. Motor and sensory function is grossly intact  Head: Normocephalic and atraumatic.  Eyes: Conjunctivae are normal. Pupils are equal, round, and reactive to light. No scleral icterus.  Neck: Normal range of motion. Neck supple. No tracheal deviation or thyromegaly present.  Cardiovascular:  SR without murmurs or gallops.  No carotid bruits Breast:  Not examined Respiratory: Effort normal.  No respiratory distress. No chest wall tenderness. Breath sounds normal.  No wheezes, rales or  rhonchi.  Abdomen:  Redundant skin of the abdomen GU:  unremarkable Musculoskeletal: Normal range of motion. Extremities are nontender. No cyanosis, edema or clubbing noted Lymphadenopathy: No cervical, preauricular, postauricular or axillary adenopathy is present Skin: Skin is warm and dry. No rash noted. No diaphoresis. No erythema. No pallor. Pscyh: Normal mood and affect. Behavior is normal. Judgment and thought content normal.   LABORATORY RESULTS: No results found. However, due to the size of the patient record, not all encounters were searched. Please check Results Review for a complete set of results.   RADIOLOGY RESULTS: No results found.  Problem List: Patient Active Problem List   Diagnosis Date Noted  . Persistent cough for 3 weeks or longer 02/28/2016  . Internal hernia 03/08/2015  . GERD (gastroesophageal reflux disease) 08/03/2014  . Acid reflux 08/03/2014  . ED (erectile dysfunction) of organic origin 07/02/2014  . 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 Counseling regarding end of life decision making 01/23/2014  . Dysphagia, pharyngoesophageal phase 08/30/2013  . Benign essential tremor 08/30/2013  . COPD, mild ([place]) 07/13/2013  . Postoperative pain, acute, shoulder 05/13/2013  . Arthralgia of shoulder 05/13/2013  . Cervical spinal stenosis 05/09/2013  . Encounter for therapeutic drug monitoring 04/26/2013  . Peripheral autonomic neuropathy due to diabetes mellitus ([place]) 12/11/2011  . Hereditary and idiopathic neuropathy 12/11/2011  . 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 History of surgical procedure 09/03/2011  . Atrial fibrillation, permanent ([place]) 08/12/2011  . Atrial fibrillation ([place]) 08/12/2011  . Bacterial overgrowth syndrome 10/28/2010  . Nonsurgical dumping syndrome 10/14/2010  . 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 Diverticulitis of colon 09/02/2010  . Acute venous embolism and  thrombosis of deep vessels  of proximal lower extremity ([place]) 08/01/2010  . Vascular disorder of lower extremity 08/01/2010  . OSTEOPOROSIS, DRUG-INDUCED 11/26/2009  . Anemia 09/02/2009  . RENAL INSUFFICIENCY 09/02/2009  . Allergic rhinitis 01/17/2009  . ASTHMA, PERSISTENT, MILD 11/13/2008  . Airway hyperreactivity 11/13/2008  . KNEE REPLACEMENT, LEFT, HX OF 12/18/2006  . HERPES ZOSTER W/NERVOUS COMPLICATION NEC 84/85/9276  . HYPERCHOLESTEROLEMIA 05/19/2006  . ANXIETY 05/19/2006  . Major depressive disorder, recurrent, mild ([place]) 05/19/2006  . RESTLESS LEG SYNDROME 05/19/2006  . DIVERTICULOSIS, COLON 05/19/2006  . IRRITABLE BOWEL SYNDROME 05/19/2006  . LOW BACK PAIN, CHRONIC 05/19/2006  . GAD (generalized anxiety disorder) 05/19/2006  . Diverticular disease of large intestine 05/19/2006  . Essential (primary) hypertension 05/19/2006  . Adaptive colitis 05/19/2006    Assessment & Plan: Hx of panniculitis with redundant abdominal pannus--for panniculectomy    Matt B. Hassell Done, MD, [facility] [phone] beeper [phone]  08/24/2016 9:37 AM

## 2016-08-25 ENCOUNTER — Inpatient Hospital Stay (HOSPITAL_COMMUNITY): Payer: Medicare Other | Admitting: Anesthesiology

## 2016-08-25 ENCOUNTER — Encounter (HOSPITAL_COMMUNITY): Payer: Self-pay | Admitting: *Deleted

## 2016-08-25 ENCOUNTER — Inpatient Hospital Stay (HOSPITAL_COMMUNITY)
Admission: RE | Admit: 2016-08-25 | Discharge: 2016-08-27 | DRG: 572 | Disposition: A | Discharge status: Home / Self Care | Payer: Medicare Other | Source: Ambulatory Visit | Attending: Surgery | Admitting: Surgery

## 2016-08-25 ENCOUNTER — Encounter (HOSPITAL_COMMUNITY)
Admission: RE | Disposition: A | Discharge status: Home / Self Care | Payer: Self-pay | Source: Ambulatory Visit | Attending: Surgery

## 2016-08-25 DIAGNOSIS — E119 Type 2 diabetes mellitus without complications: Secondary | ICD-10-CM | POA: Diagnosis not present

## 2016-08-25 DIAGNOSIS — J449 Chronic obstructive pulmonary disease, unspecified: Secondary | ICD-10-CM | POA: Diagnosis not present

## 2016-08-25 DIAGNOSIS — I10 Essential (primary) hypertension: Secondary | ICD-10-CM | POA: Diagnosis not present

## 2016-08-25 DIAGNOSIS — Z9842 Cataract extraction status, left eye: Secondary | ICD-10-CM

## 2016-08-25 DIAGNOSIS — E1151 Type 2 diabetes mellitus with diabetic peripheral angiopathy without gangrene: Secondary | ICD-10-CM | POA: Diagnosis present

## 2016-08-25 DIAGNOSIS — Z7901 Long term (current) use of anticoagulants: Secondary | ICD-10-CM

## 2016-08-25 DIAGNOSIS — E1136 Type 2 diabetes mellitus with diabetic cataract: Secondary | ICD-10-CM | POA: Diagnosis present

## 2016-08-25 DIAGNOSIS — Z9841 Cataract extraction status, right eye: Secondary | ICD-10-CM | POA: Diagnosis not present

## 2016-08-25 DIAGNOSIS — M793 Panniculitis, unspecified: Secondary | ICD-10-CM | POA: Diagnosis not present

## 2016-08-25 DIAGNOSIS — Z85828 Personal history of other malignant neoplasm of skin: Secondary | ICD-10-CM | POA: Diagnosis not present

## 2016-08-25 DIAGNOSIS — Z79899 Other long term (current) drug therapy: Secondary | ICD-10-CM

## 2016-08-25 DIAGNOSIS — I482 Chronic atrial fibrillation: Secondary | ICD-10-CM | POA: Diagnosis present

## 2016-08-25 DIAGNOSIS — F329 Major depressive disorder, single episode, unspecified: Secondary | ICD-10-CM | POA: Diagnosis present

## 2016-08-25 DIAGNOSIS — F411 Generalized anxiety disorder: Secondary | ICD-10-CM | POA: Diagnosis present

## 2016-08-25 DIAGNOSIS — Z981 Arthrodesis status: Secondary | ICD-10-CM

## 2016-08-25 DIAGNOSIS — Z86711 Personal history of pulmonary embolism: Secondary | ICD-10-CM | POA: Diagnosis not present

## 2016-08-25 DIAGNOSIS — Z9884 Bariatric surgery status: Secondary | ICD-10-CM | POA: Diagnosis not present

## 2016-08-25 DIAGNOSIS — R600 Localized edema: Secondary | ICD-10-CM | POA: Diagnosis not present

## 2016-08-25 DIAGNOSIS — Z96612 Presence of left artificial shoulder joint: Secondary | ICD-10-CM | POA: Diagnosis present

## 2016-08-25 DIAGNOSIS — E65 Localized adiposity: Secondary | ICD-10-CM | POA: Diagnosis not present

## 2016-08-25 DIAGNOSIS — Z96652 Presence of left artificial knee joint: Secondary | ICD-10-CM | POA: Diagnosis not present

## 2016-08-25 DIAGNOSIS — H9190 Unspecified hearing loss, unspecified ear: Secondary | ICD-10-CM | POA: Diagnosis present

## 2016-08-25 DIAGNOSIS — Z9889 Other specified postprocedural states: Secondary | ICD-10-CM

## 2016-08-25 DIAGNOSIS — K219 Gastro-esophageal reflux disease without esophagitis: Secondary | ICD-10-CM | POA: Diagnosis present

## 2016-08-25 HISTORY — PX: PANNICULECTOMY: SHX5360

## 2016-08-25 LAB — CREATININE, SERUM
Creatinine, Ser: 1.76 mg/dL — ABNORMAL HIGH (ref 0.61–1.24)
GFR calc Af Amer: 42 mL/min — ABNORMAL LOW (ref >60)
GFR calc non Af Amer: 36 mL/min — ABNORMAL LOW (ref >60)

## 2016-08-25 LAB — GLUCOSE, CAPILLARY
GLUCOSE-CAPILLARY: 136 mg/dL — AB (ref 65–99)
Glucose-Capillary: 150 mg/dL — ABNORMAL HIGH (ref 65–99)

## 2016-08-25 LAB — CBC
HEMATOCRIT: 34.7 % — AB (ref 39.0–52.0)
HEMOGLOBIN: 11.6 g/dL — AB (ref 13.0–17.0)
MCH: 32.5 pg (ref 26.0–34.0)
MCHC: 33.4 g/dL (ref 30.0–36.0)
MCV: 97.2 fL (ref 78.0–100.0)
Platelets: 168 10*3/uL (ref 150–400)
RBC: 3.57 MIL/uL — AB (ref 4.22–5.81)
RDW: 13.7 % (ref 11.5–15.5)
WBC: 7.6 10*3/uL (ref 4.0–10.5)

## 2016-08-25 SURGERY — PANNICULECTOMY
Anesthesia: General | Site: Abdomen

## 2016-08-25 MED ORDER — HEPARIN SODIUM (PORCINE) 5000 UNIT/ML IJ SOLN
5000.0000 [IU] | Freq: Once | INTRAMUSCULAR | Status: AC
  Administered 2016-08-25: 5000 [IU] via SUBCUTANEOUS
  Filled 2016-08-25: qty 1

## 2016-08-25 MED ORDER — FENTANYL CITRATE (PF) 100 MCG/2ML IJ SOLN
12.5000 ug | INTRAMUSCULAR | Status: DC | PRN
  Filled 2016-08-25: qty 2

## 2016-08-25 MED ORDER — ESMOLOL HCL 100 MG/10ML IV SOLN
INTRAVENOUS | Status: DC | PRN
  Administered 2016-08-25: 20 mg via INTRAVENOUS
  Administered 2016-08-25: 10 mg via INTRAVENOUS
  Administered 2016-08-25 (×2): 20 mg via INTRAVENOUS

## 2016-08-25 MED ORDER — PROMETHAZINE HCL 25 MG/ML IJ SOLN: 6.2500 mg | INTRAMUSCULAR | Status: DC | PRN

## 2016-08-25 MED ORDER — CHLORHEXIDINE GLUCONATE CLOTH 2 % EX PADS: 6.0000 | MEDICATED_PAD | Freq: Once | CUTANEOUS | Status: DC

## 2016-08-25 MED ORDER — PHENYLEPHRINE HCL 10 MG/ML IJ SOLN
INTRAVENOUS | Status: DC | PRN
  Administered 2016-08-25: 10 ug/min via INTRAVENOUS

## 2016-08-25 MED ORDER — LISINOPRIL 5 MG PO TABS
2.5000 mg | ORAL_TABLET | Freq: Every day | ORAL | Status: DC
  Administered 2016-08-25 – 2016-08-27 (×2): 2.5 mg via ORAL
  Filled 2016-08-25 (×2): qty 1

## 2016-08-25 MED ORDER — DEXTROSE 5 % IV SOLN
2.0000 g | Freq: Two times a day (BID) | INTRAVENOUS | Status: AC
  Administered 2016-08-25: 2 g via INTRAVENOUS
  Filled 2016-08-25 (×2): qty 2

## 2016-08-25 MED ORDER — PHENYLEPHRINE HCL 10 MG/ML IJ SOLN
INTRAMUSCULAR | Status: AC
  Filled 2016-08-25: qty 1

## 2016-08-25 MED ORDER — ZOLPIDEM TARTRATE 5 MG PO TABS
5.0000 mg | ORAL_TABLET | Freq: Every evening | ORAL | Status: DC | PRN
  Administered 2016-08-25 – 2016-08-26 (×2): 5 mg via ORAL
  Filled 2016-08-25 (×2): qty 1

## 2016-08-25 MED ORDER — SUGAMMADEX SODIUM 200 MG/2ML IV SOLN
INTRAVENOUS | Status: AC
  Filled 2016-08-25: qty 2

## 2016-08-25 MED ORDER — EPHEDRINE SULFATE-NACL 50-0.9 MG/10ML-% IV SOSY
PREFILLED_SYRINGE | INTRAVENOUS | Status: DC | PRN
  Administered 2016-08-25 (×6): 10 mg via INTRAVENOUS

## 2016-08-25 MED ORDER — LIDOCAINE 2% (20 MG/ML) 5 ML SYRINGE
INTRAMUSCULAR | Status: AC
  Filled 2016-08-25: qty 5

## 2016-08-25 MED ORDER — ACETAMINOPHEN 10 MG/ML IV SOLN
INTRAVENOUS | Status: AC
  Administered 2016-08-25: 21:00:00
  Filled 2016-08-25: qty 100

## 2016-08-25 MED ORDER — FENTANYL CITRATE (PF) 100 MCG/2ML IJ SOLN
INTRAMUSCULAR | Status: DC | PRN
  Administered 2016-08-25 (×2): 50 ug via INTRAVENOUS
  Administered 2016-08-25: 25 ug via INTRAVENOUS
  Administered 2016-08-25: 50 ug via INTRAVENOUS
  Administered 2016-08-25: 25 ug via INTRAVENOUS
  Administered 2016-08-25 (×3): 50 ug via INTRAVENOUS

## 2016-08-25 MED ORDER — LACTATED RINGERS IV SOLN: INTRAVENOUS | Status: DC

## 2016-08-25 MED ORDER — LIDOCAINE 2% (20 MG/ML) 5 ML SYRINGE
INTRAMUSCULAR | Status: DC | PRN
  Administered 2016-08-25: 80 mg via INTRAVENOUS

## 2016-08-25 MED ORDER — ONDANSETRON HCL 4 MG/2ML IJ SOLN
INTRAMUSCULAR | Status: DC | PRN
  Administered 2016-08-25: 4 mg via INTRAVENOUS

## 2016-08-25 MED ORDER — ACETAMINOPHEN 500 MG PO TABS
1000.0000 mg | ORAL_TABLET | ORAL | Status: AC
  Administered 2016-08-25: 1000 mg via ORAL
  Filled 2016-08-25: qty 2

## 2016-08-25 MED ORDER — OXYCODONE-ACETAMINOPHEN 5-325 MG PO TABS
1.0000 | ORAL_TABLET | ORAL | Status: DC | PRN
  Administered 2016-08-25 – 2016-08-26 (×3): 1 via ORAL
  Filled 2016-08-25 (×3): qty 1

## 2016-08-25 MED ORDER — FENTANYL CITRATE (PF) 100 MCG/2ML IJ SOLN
25.0000 ug | INTRAMUSCULAR | Status: DC | PRN
  Administered 2016-08-25 (×4): 50 ug via INTRAVENOUS

## 2016-08-25 MED ORDER — ACETAMINOPHEN 10 MG/ML IV SOLN
1000.0000 mg | Freq: Four times a day (QID) | INTRAVENOUS | Status: DC
Start: 2016-08-25 — End: 2016-08-25
  Administered 2016-08-25: 1000 mg via INTRAVENOUS

## 2016-08-25 MED ORDER — FENTANYL CITRATE (PF) 100 MCG/2ML IJ SOLN
INTRAMUSCULAR | Status: AC
Start: 2016-08-25 — End: 2016-08-25
  Administered 2016-08-25: 21:00:00
  Filled 2016-08-25: qty 4

## 2016-08-25 MED ORDER — ISOPROPYL ALCOHOL 70 % SOLN
Status: AC
  Filled 2016-08-25: qty 480

## 2016-08-25 MED ORDER — LACTATED RINGERS IV SOLN
INTRAVENOUS | Status: DC
  Administered 2016-08-25 (×3): via INTRAVENOUS

## 2016-08-25 MED ORDER — FENTANYL CITRATE (PF) 100 MCG/2ML IJ SOLN
INTRAMUSCULAR | Status: AC
  Filled 2016-08-25: qty 2

## 2016-08-25 MED ORDER — PROPOFOL 10 MG/ML IV BOLUS
INTRAVENOUS | Status: DC | PRN
  Administered 2016-08-25: 30 mg via INTRAVENOUS
  Administered 2016-08-25: 100 mg via INTRAVENOUS

## 2016-08-25 MED ORDER — DEXAMETHASONE SODIUM PHOSPHATE 10 MG/ML IJ SOLN
INTRAMUSCULAR | Status: AC
  Filled 2016-08-25: qty 1

## 2016-08-25 MED ORDER — ROCURONIUM BROMIDE 50 MG/5ML IV SOSY
PREFILLED_SYRINGE | INTRAVENOUS | Status: DC | PRN
  Administered 2016-08-25: 10 mg via INTRAVENOUS
  Administered 2016-08-25: 40 mg via INTRAVENOUS
  Administered 2016-08-25: 10 mg via INTRAVENOUS
  Administered 2016-08-25: 20 mg via INTRAVENOUS

## 2016-08-25 MED ORDER — PROPOFOL 10 MG/ML IV BOLUS
INTRAVENOUS | Status: AC
  Filled 2016-08-25: qty 20

## 2016-08-25 MED ORDER — CELECOXIB 200 MG PO CAPS
400.0000 mg | ORAL_CAPSULE | ORAL | Status: AC
  Administered 2016-08-25: 400 mg via ORAL
  Filled 2016-08-25: qty 2

## 2016-08-25 MED ORDER — ONDANSETRON 4 MG PO TBDP: 4.0000 mg | ORAL_TABLET | Freq: Four times a day (QID) | ORAL | Status: DC | PRN

## 2016-08-25 MED ORDER — EPHEDRINE 5 MG/ML INJ
INTRAVENOUS | Status: AC
  Filled 2016-08-25: qty 10

## 2016-08-25 MED ORDER — PHENYLEPHRINE 40 MCG/ML (10ML) SYRINGE FOR IV PUSH (FOR BLOOD PRESSURE SUPPORT)
PREFILLED_SYRINGE | INTRAVENOUS | Status: AC
  Filled 2016-08-25: qty 10

## 2016-08-25 MED ORDER — ONDANSETRON HCL 4 MG/2ML IJ SOLN: 4.0000 mg | Freq: Four times a day (QID) | INTRAMUSCULAR | Status: DC | PRN

## 2016-08-25 MED ORDER — PANTOPRAZOLE SODIUM 40 MG IV SOLR
40.0000 mg | Freq: Every day | INTRAVENOUS | Status: DC
  Administered 2016-08-25 – 2016-08-26 (×2): 40 mg via INTRAVENOUS
  Filled 2016-08-25 (×2): qty 40

## 2016-08-25 MED ORDER — ESMOLOL HCL 100 MG/10ML IV SOLN
INTRAVENOUS | Status: AC
  Filled 2016-08-25: qty 10

## 2016-08-25 MED ORDER — ISOPROPYL ALCOHOL 70 % SOLN
Status: DC | PRN
  Administered 2016-08-25: 1 via TOPICAL

## 2016-08-25 MED ORDER — FENTANYL CITRATE (PF) 250 MCG/5ML IJ SOLN
INTRAMUSCULAR | Status: AC
  Filled 2016-08-25: qty 5

## 2016-08-25 MED ORDER — ONDANSETRON HCL 4 MG/2ML IJ SOLN
INTRAMUSCULAR | Status: AC
  Filled 2016-08-25: qty 2

## 2016-08-25 MED ORDER — TAMSULOSIN HCL 0.4 MG PO CAPS
0.4000 mg | ORAL_CAPSULE | Freq: Every evening | ORAL | Status: DC
  Administered 2016-08-25 – 2016-08-26 (×2): 0.4 mg via ORAL
  Filled 2016-08-25 (×2): qty 1

## 2016-08-25 MED ORDER — HEPARIN SODIUM (PORCINE) 5000 UNIT/ML IJ SOLN
5000.0000 [IU] | Freq: Three times a day (TID) | INTRAMUSCULAR | Status: DC
  Administered 2016-08-25 – 2016-08-27 (×6): 5000 [IU] via SUBCUTANEOUS
  Filled 2016-08-25 (×5): qty 1

## 2016-08-25 MED ORDER — ROCURONIUM BROMIDE 50 MG/5ML IV SOSY
PREFILLED_SYRINGE | INTRAVENOUS | Status: AC
  Filled 2016-08-25: qty 10

## 2016-08-25 MED ORDER — KCL IN DEXTROSE-NACL 20-5-0.45 MEQ/L-%-% IV SOLN
INTRAVENOUS | Status: DC
  Administered 2016-08-25 – 2016-08-27 (×3): via INTRAVENOUS
  Filled 2016-08-25 (×3): qty 1000

## 2016-08-25 MED ORDER — PHENYLEPHRINE 40 MCG/ML (10ML) SYRINGE FOR IV PUSH (FOR BLOOD PRESSURE SUPPORT)
PREFILLED_SYRINGE | INTRAVENOUS | Status: DC | PRN
  Administered 2016-08-25 (×2): 80 ug via INTRAVENOUS
  Administered 2016-08-25: 120 ug via INTRAVENOUS
  Administered 2016-08-25: 80 ug via INTRAVENOUS

## 2016-08-25 MED ORDER — CEFOTETAN DISODIUM-DEXTROSE 2-2.08 GM-% IV SOLR
INTRAVENOUS | Status: AC
  Filled 2016-08-25: qty 50

## 2016-08-25 MED ORDER — GABAPENTIN 300 MG PO CAPS
300.0000 mg | ORAL_CAPSULE | ORAL | Status: AC
  Administered 2016-08-25: 300 mg via ORAL
  Filled 2016-08-25: qty 1

## 2016-08-25 MED ORDER — SUGAMMADEX SODIUM 200 MG/2ML IV SOLN
INTRAVENOUS | Status: DC | PRN
  Administered 2016-08-25: 180 mg via INTRAVENOUS

## 2016-08-25 MED ORDER — MEPERIDINE HCL 50 MG/ML IJ SOLN: 6.2500 mg | INTRAMUSCULAR | Status: DC | PRN

## 2016-08-25 MED ORDER — CEFOTETAN DISODIUM-DEXTROSE 2-2.08 GM-% IV SOLR
2.0000 g | INTRAVENOUS | Status: AC
  Administered 2016-08-25: 2 g via INTRAVENOUS

## 2016-08-25 MED ORDER — 0.9 % SODIUM CHLORIDE (POUR BTL) OPTIME
TOPICAL | Status: DC | PRN
  Administered 2016-08-25: 1000 mL

## 2016-08-25 SURGICAL SUPPLY — 45 items
APPLICATOR COTTON TIP 6IN STRL (MISCELLANEOUS) ×3 IMPLANT
BLADE EXTENDED COATED 6.5IN (ELECTRODE) IMPLANT
BLADE HEX COATED 2.75 (ELECTRODE) ×1 IMPLANT
BLADE SURG 15 STRL LF DISP TIS (BLADE) ×1 IMPLANT
BLADE SURG 15 STRL SS (BLADE) ×3
BLADE SURG SZ10 CARB STEEL (BLADE) ×9 IMPLANT
BNDG GAUZE ELAST 4 BULKY (GAUZE/BANDAGES/DRESSINGS) ×2 IMPLANT
COVER MAYO STAND STRL (DRAPES) IMPLANT
COVER SURGICAL LIGHT HANDLE (MISCELLANEOUS) ×3 IMPLANT
DRAPE WARM FLUID 44X44 (DRAPE) ×3 IMPLANT
ELECT PENCIL ROCKER SW 15FT (MISCELLANEOUS) ×5 IMPLANT
ELECT REM PT RETURN 15FT ADLT (MISCELLANEOUS) ×5 IMPLANT
GAUZE SPONGE 4X4 12PLY STRL (GAUZE/BANDAGES/DRESSINGS) ×3 IMPLANT
GLOVE BIOGEL M 8.0 STRL (GLOVE) ×1 IMPLANT
GLOVE BIOGEL PI IND STRL 7.0 (GLOVE) ×1 IMPLANT
GLOVE BIOGEL PI INDICATOR 7.0 (GLOVE) ×2
GLOVE SURG SS PI 6.5 STRL IVOR (GLOVE) ×2 IMPLANT
GLOVE SURG SS PI 7.0 STRL IVOR (GLOVE) ×8 IMPLANT
GLOVE SURG SS PI 8.0 STRL IVOR (GLOVE) ×4 IMPLANT
GOWN SPEC L4 XLG W/TWL (GOWN DISPOSABLE) ×1 IMPLANT
GOWN STRL REUS W/TWL LRG LVL3 (GOWN DISPOSABLE) ×3 IMPLANT
GOWN STRL REUS W/TWL XL LVL3 (GOWN DISPOSABLE) ×9 IMPLANT
HANDLE SUCTION POOLE (INSTRUMENTS) IMPLANT
HOLDER FOLEY CATH W/STRAP (MISCELLANEOUS) ×2 IMPLANT
KIT BASIN OR (CUSTOM PROCEDURE TRAY) ×3 IMPLANT
LIGASURE IMPACT 36 18CM CVD LR (INSTRUMENTS) ×2 IMPLANT
NS IRRIG 1000ML POUR BTL (IV SOLUTION) ×4 IMPLANT
PACK UNIVERSAL I (CUSTOM PROCEDURE TRAY) ×3 IMPLANT
PREVENA INCISION MGT 90 150 (MISCELLANEOUS) ×2 IMPLANT
SPONGE LAP 18X18 X RAY DECT (DISPOSABLE) ×9 IMPLANT
STAPLER VISISTAT 35W (STAPLE) ×4 IMPLANT
SUCTION POOLE HANDLE (INSTRUMENTS)
SUT ETHILON 4 0 PS 2 18 (SUTURE) ×24 IMPLANT
SUT SILK 2 0 (SUTURE) ×3
SUT SILK 2-0 18XBRD TIE 12 (SUTURE) ×1 IMPLANT
SUT VIC AB 2-0 SH 18 (SUTURE) ×2 IMPLANT
SUT VIC AB 3-0 SH 18 (SUTURE) ×18 IMPLANT
SUT VIC AB 4-0 SH 18 (SUTURE) ×6 IMPLANT
SUT VICRYL 0 UR6 27IN ABS (SUTURE) ×2 IMPLANT
SYR BULB IRRIGATION 50ML (SYRINGE) ×3 IMPLANT
TOWEL OR 17X26 10 PK STRL BLUE (TOWEL DISPOSABLE) ×5 IMPLANT
TRAY FOLEY BAG SILVER LF 16FR (SET/KITS/TRAYS/PACK) ×2 IMPLANT
TRAY FOLEY W/METER SILVER 16FR (SET/KITS/TRAYS/PACK) ×1 IMPLANT
YANKAUER SUCT BULB TIP 10FT TU (MISCELLANEOUS) ×5 IMPLANT
YANKAUER SUCT BULB TIP NO VENT (SUCTIONS) IMPLANT

## 2016-08-25 NOTE — Brief Op Note (Signed)
08/25/2016  10:33 AM  PATIENT:  Trina Ao  76 y.o. male  PRE-OPERATIVE DIAGNOSIS:  DM II, GERD, Airway Hyperactivity, COPD, A-Fib, Renal Insufficiency, Anemia, HTN, Pannucilitis  POST-OPERATIVE DIAGNOSIS:  DM II, GERD, Airway Hyperactivity, COPD, A-Fib, Renal Insufficiency, Anemia, HTN, Pannucilitis  PROCEDURE:  Procedure(s): PANNICULECTOMY (N/A)  SURGEON:  Surgeon(s) and Role:    [name], MD - Primary    * Greer Pickerel, MD - Assisting  PHYSICIAN ASSISTANT:   ASSISTANTS: Greer Pickerel, MD, FACS   ANESTHESIA:   general  EBL:  Total I/O In: 2000 [I.V.:2000] Out: 67 [Urine:45; Blood:10]  BLOOD ADMINISTERED:none  DRAINS: none   LOCAL MEDICATIONS USED:  NONE  SPECIMEN:  Source of Specimen:  abdominal pannus  DISPOSITION OF SPECIMEN:  PATHOLOGY  COUNTS:  YES  TOURNIQUET:  * No tourniquets in log *  DICTATION: .Other Dictation: Dictation Number [id number]  PLAN OF CARE: Admit to inpatient   PATIENT DISPOSITION:  PACU - hemodynamically stable.   Delay start of Pharmacological VTE agent (>24hrs) due to surgical blood loss or risk of bleeding: no

## 2016-08-25 NOTE — Transfer of Care (Signed)
Immediate Anesthesia Transfer of Care Note  Patient: [name]  Procedure(s) Performed: Procedure(s): PANNICULECTOMY (N/A)  Patient Location: PACU  Anesthesia Type:General  Level of Consciousness: Patient easily awoken, sedated, comfortable, cooperative, following commands, responds to stimulation.   Airway & Oxygen Therapy: Patient spontaneously breathing, ventilating well, oxygen via simple oxygen mask.  Post-op Assessment: Report given to PACU RN, vital signs reviewed and stable, moving all extremities.   Post vital signs: Reviewed and stable.  Complications: No apparent anesthesia complications  Last Vitals:  Vitals:   08/25/16 0530  BP: 108/72  Pulse: (!) 58  Resp: 16  Temp: 36.6 C    Last Pain: There were no vitals filed for this visit.    Patients Stated Pain Goal: 4 (14/70/92 9574)  Complications: No apparent anesthesia complications

## 2016-08-25 NOTE — Anesthesia Postprocedure Evaluation (Signed)
Anesthesia Post Note  Patient: [name]  Procedure(s) Performed: Procedure(s) (LRB): PANNICULECTOMY (N/A)     Patient location during evaluation: PACU Anesthesia Type: General Level of consciousness: awake and alert Pain management: pain level controlled Vital Signs Assessment: post-procedure vital signs reviewed and stable Respiratory status: spontaneous breathing, nonlabored ventilation, respiratory function stable and patient connected to nasal cannula oxygen Cardiovascular status: blood pressure returned to baseline and stable Postop Assessment: no signs of nausea or vomiting Anesthetic complications: no    Last Vitals:  Vitals:   08/25/16 1222 08/25/16 1237  BP: 128/78 113/79  Pulse: 86 81  Resp: 14 14  Temp: 36.6 C 36.7 C                 Effie Berkshire

## 2016-08-25 NOTE — H&P (View-Only) (Signed)
Chief Complaint:  panniculitis  History of Present Illness:  [name] is an 76 y.o. male who had a roux en Y gastric bypass and lost 100 lbs and had his DM abate.  The redundant abdominal skin has proved to be a problem with panniculitis.    Past Medical History:  Diagnosis Date  . Anxiety state, unspecified   . Arthritis    hands   . Bruises easily    pt is on Coumadin  . Cancer ([place])    SKIN CANCER REMOVED  . Chronic atrial fibrillation (HCC)    a.fib on warfarin- Dr. Percival Spanish follows  . Depressive disorder, not elsewhere classified    takes Prozac daily  . Diverticulosis of colon (without mention of hemorrhage)   . ED (erectile dysfunction)   . GERD (gastroesophageal reflux disease)   . Hard of hearing    wears hearing aids  . History of blood clots    behind right knee and then went into left lung 67yrs ago  . History of colon polyps   . History of kidney stones   . Hx of diabetes mellitus    "no longer diabetic since gastric bypass" - no longer taking metformin"  in 2 months "problems with low blood sugar now"  . Hx of gout    but doesn't take any meds  . Inflammation of colonic mucosa    recent admission and release from [facility] 02-28-15-remains on oral antibiotic  . Joint pain   . Peripheral edema    takes Furosemide daily  . Peripheral vascular disease ([place])   . Pneumonia    last time about 61yrs ago  . Pulmonary embolism ([place])    over 10 yrs ago "many years ago"  . Restless legs syndrome (RLS)   . Unspecified asthma(493.90)    as a child  . Unspecified essential hypertension    hx of no longer on medication due to gastric bypass     Past Surgical History:  Procedure Laterality Date  . ANTERIOR CERVICAL DECOMP/DISCECTOMY FUSION N/A 05/09/2013   Procedure: ANTERIOR CERVICAL DECOMPRESSION/DISCECTOMY FUSION CERVICAL THREE-FOUR,CERVICAL FOUR-FIVE,CERVICAL FIVE-SIX;  Surgeon: Floyce Stakes, MD;  Location: [facility]URO [facility]S;  Service: Neurosurgery;  Laterality:  N/A;  . ARTERY REPAIR     Left forearm  . BACK SURGERY     x 3  . BREATH TEK H PYLORI  01/08/2011   Procedure: BREATH TEK H PYLORI;  Surgeon: Pedro Earls, MD;  Location: [facility];  Service: General;  Laterality: N/A;  . CARDIOVERSION     x 2 attempts-unsuccessful.  [name] CATARACT EXTRACTION, BILATERAL    . COLONOSCOPY    . CYSTOSCOPY    . ESOPHAGOGASTRODUODENOSCOPY (EGD) WITH PROPOFOL N/A 09/21/2014   Procedure: ESOPHAGOGASTRODUODENOSCOPY (EGD) WITH PROPOFOL;  Surgeon: Manya Silvas, MD;  Location: [facility] ENDOSCOPY;  Service: Endoscopy;  Laterality: N/A;  . EYE SURGERY     cataract bil  . FOOT SURGERY     Left foot   . GASTRIC ROUX-EN-Y  08/11/2011   Procedure: LAPAROSCOPIC ROUX-EN-Y GASTRIC BYPASS WITH UPPER ENDOSCOPY;  Surgeon: Pedro Earls, MD;  Location: [facility] ORS;  Service: General;  Laterality: N/A;  . HAND SURGERY     LEFT  . injections in back     x 18  . KNEE SURGERY Right    x 4  . KNEE SURGERY Left    arthroscopy  . LAPAROSCOPIC INTERNAL HERNIA REPAIR N/A 03/08/2015   Procedure: LAPAROSCOPIC INTERNAL HERNIA REPAIR ;  Surgeon:  [name], MD;  Location: [facility] ORS;  Service: General;  Laterality: N/A;  . LEG SURGERY     FOR NECROTIZING FASCITIS L LEG AND GROIN  . PENILE PROSTHESIS IMPLANT N/A 07/02/2014   Procedure: PENILE PROTHESIS INFLATABLE 3 PIECE (COLOPLAST) SCROTAL APPROACH;  Surgeon: Kathie Rhodes, MD;  Location: [facility] ORS;  Service: Urology;  Laterality: N/A;  . PENILE PROSTHESIS IMPLANT N/A 11/23/2014   Procedure: EXPLORATION AND REVISION OF PENILE PROSTHESIS;  Surgeon: Kathie Rhodes, MD;  Location: [facility] ORS;  Service: Urology;  Laterality: N/A;  . REPLACEMENT TOTAL KNEE Left    x 2  . SHOULDER ARTHROSCOPY    . SHOULDER SURGERY Left 2014  . TONSILLECTOMY    . TOTAL SHOULDER ARTHROPLASTY Left 01/26/2013   Procedure: TOTAL SHOULDER ARTHROPLASTY;  Surgeon: Nita Sells, MD;  Location: [facility];  Service: Orthopedics;  Laterality: Left;  Left total shoulder  arthroplasty    Current Outpatient Prescriptions  Medication Sig Dispense Refill  . acarbose (PRECOSE) 25 MG tablet Take 25 mg by mouth 2 (two) times daily.     [name] acetaminophen (TYLENOL) 500 MG tablet Take 1,000 mg by mouth every 6 (six) hours as needed (for pain.).    [name] atorvastatin (LIPITOR) 80 MG tablet Take 1 tablet by mouth at  bedtime 90 tablet 3  . FLUoxetine (PROZAC) 40 MG capsule Take 1 capsule by mouth  daily 90 capsule 3  . furosemide (LASIX) 20 MG tablet TAKE 1 TO 2 TABLETS BY  MOUTH DAILY (Patient taking differently: TAKE 2 TABLETS (40 MG)  BY MOUTH DAILY) 180 tablet 1  . gabapentin (NEURONTIN) 300 MG capsule Take 1 capsule (300 mg total) by mouth 2 (two) times daily. 180 capsule 1  . hydrocortisone 2.5 % cream APPLY ON THE SKIN TWICE A DAILY TO GROINS, ARMS, AND SIDES FOR RASH  4  . L-Methylfolate-Algae-B12-B6 (METANX) 3-90.314-2-35 MG CAPS Take 1 capsule by mouth 2 (two) times daily.    . lansoprazole (PREVACID) 30 MG capsule TAKE 1 CAPSULE (30 MG TOTAL) BY MOUTH DAILY BEFORE BREAKFAST.  5  . lidocaine (XYLOCAINE) 5 % ointment Apply 1 application topically daily. APPLIED TO AFFECTED AREA OF FOOT  2  . lisinopril (PRINIVIL,ZESTRIL) 2.5 MG tablet Take 2.5 mg by mouth daily.     [name] moxifloxacin (AVELOX) 400 MG tablet Take 1 tablet (400 mg total) by mouth daily at 8 pm. (Patient not taking: Reported on 08/18/2016) 7 tablet 0  . NONFORMULARY OR COMPOUNDED ITEM Apply 1 application topically daily. APPLY TO AFFECTED AREAS DAILY (DO NOT APPLY TO FACE, GROIN, UNDERARMS, OR NECK)  2  . nystatin cream (MYCOSTATIN) Apply 1 application topically daily. APPLIED AFTER BATHING    . omeprazole (PRILOSEC) 40 MG capsule Take 40 mg by mouth at bedtime.    . primidone (MYSOLINE) 50 MG tablet Take 2 tablets (100 mg total) by mouth 2 (two) times daily. 360 tablet 1  . ranitidine (ZANTAC) 300 MG tablet Take 300 mg by mouth at bedtime.     . silodosin (RAPAFLO) 8 MG CAPS capsule Take 8 mg by mouth daily  with breakfast.     . sucralfate (CARAFATE) 1 g tablet Take 1 g by mouth daily before breakfast.     . tamsulosin (FLOMAX) 0.4 MG CAPS capsule Take 0.4 mg by mouth every evening.   3  . warfarin (COUMADIN) 2.5 MG tablet TAKE 1 TO 1.5 TABLETS BY  MOUTH DAILY OR AS DIRECTED  BY COUMADIN CLINIC (Patient taking differently: TAKE 1  TABLET (2.5 MG) ON SUNDAY, [place]. TAKE 1.5 TABLETS (3.75 MG) BY MOUTH ON MONDAY, WEDNESDAY, FRIDAY, & SATURDAY.) 135 tablet 1  . zolpidem (AMBIEN) 10 MG tablet TAKE 1 TABLET AT BEDTIME AS NEEDED FOR SLEEP (Patient taking differently: TAKE 1 TABLET (10 MG) AT BEDTIME NIGHTLY) 30 tablet 1   No current facility-administered medications for this visit.    Sulfacetamide sodium; Adhesive [tape]; and Latex Family History  Problem Relation Age of Onset  . Uterine cancer Mother        mets  . Breast cancer Mother   . Cancer Mother        cervical  . Emphysema Father   . Alzheimer's disease Sister   . Prostate cancer Brother   . Heart attack Brother   . Colon cancer Brother 19   Social History:   reports that he has never smoked. He has never used smokeless tobacco. He reports that he does not drink alcohol or use drugs.   REVIEW OF SYSTEMS : Negative except for see problem list  Physical Exam:   There were no vitals taken for this visit. There is no height or weight on file to calculate BMI.  Gen:  WDWN WM NAD  Neurological: Alert and oriented to person, place, and time. Motor and sensory function is grossly intact  Head: Normocephalic and atraumatic.  Eyes: Conjunctivae are normal. Pupils are equal, round, and reactive to light. No scleral icterus.  Neck: Normal range of motion. Neck supple. No tracheal deviation or thyromegaly present.  Cardiovascular:  SR without murmurs or gallops.  No carotid bruits Breast:  Not examined Respiratory: Effort normal.  No respiratory distress. No chest wall tenderness. Breath sounds normal.  No wheezes, rales or  rhonchi.  Abdomen:  Redundant skin of the abdomen GU:  unremarkable Musculoskeletal: Normal range of motion. Extremities are nontender. No cyanosis, edema or clubbing noted Lymphadenopathy: No cervical, preauricular, postauricular or axillary adenopathy is present Skin: Skin is warm and dry. No rash noted. No diaphoresis. No erythema. No pallor. Pscyh: Normal mood and affect. Behavior is normal. Judgment and thought content normal.   LABORATORY RESULTS: No results found. However, due to the size of the patient record, not all encounters were searched. Please check Results Review for a complete set of results.   RADIOLOGY RESULTS: No results found.  Problem List: Patient Active Problem List   Diagnosis Date Noted  . Persistent cough for 3 weeks or longer 02/28/2016  . Internal hernia 03/08/2015  . GERD (gastroesophageal reflux disease) 08/03/2014  . Acid reflux 08/03/2014  . ED (erectile dysfunction) of organic origin 07/02/2014  . 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 Counseling regarding end of life decision making 01/23/2014  . Dysphagia, pharyngoesophageal phase 08/30/2013  . Benign essential tremor 08/30/2013  . COPD, mild ([place]) 07/13/2013  . Postoperative pain, acute, shoulder 05/13/2013  . Arthralgia of shoulder 05/13/2013  . Cervical spinal stenosis 05/09/2013  . Encounter for therapeutic drug monitoring 04/26/2013  . Peripheral autonomic neuropathy due to diabetes mellitus ([place]) 12/11/2011  . Hereditary and idiopathic neuropathy 12/11/2011  . 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 History of surgical procedure 09/03/2011  . Atrial fibrillation, permanent ([place]) 08/12/2011  . Atrial fibrillation ([place]) 08/12/2011  . Bacterial overgrowth syndrome 10/28/2010  . Nonsurgical dumping syndrome 10/14/2010  . 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 Diverticulitis of colon 09/02/2010  . Acute venous embolism and  thrombosis of deep vessels  of proximal lower extremity ([place]) 08/01/2010  . Vascular disorder of lower extremity 08/01/2010  . OSTEOPOROSIS, DRUG-INDUCED 11/26/2009  . Anemia 09/02/2009  . RENAL INSUFFICIENCY 09/02/2009  . Allergic rhinitis 01/17/2009  . ASTHMA, PERSISTENT, MILD 11/13/2008  . Airway hyperreactivity 11/13/2008  . KNEE REPLACEMENT, LEFT, HX OF 12/18/2006  . HERPES ZOSTER W/NERVOUS COMPLICATION NEC 05/39/7673  . HYPERCHOLESTEROLEMIA 05/19/2006  . ANXIETY 05/19/2006  . Major depressive disorder, recurrent, mild ([place]) 05/19/2006  . RESTLESS LEG SYNDROME 05/19/2006  . DIVERTICULOSIS, COLON 05/19/2006  . IRRITABLE BOWEL SYNDROME 05/19/2006  . LOW BACK PAIN, CHRONIC 05/19/2006  . GAD (generalized anxiety disorder) 05/19/2006  . Diverticular disease of large intestine 05/19/2006  . Essential (primary) hypertension 05/19/2006  . Adaptive colitis 05/19/2006    Assessment & Plan: Hx of panniculitis with redundant abdominal pannus--for panniculectomy    Matt B. Hassell Done, MD, [facility] [phone] beeper [phone]  08/24/2016 9:37 AM

## 2016-08-25 NOTE — Interval H&P Note (Signed)
History and Physical Interval Note:  08/25/2016 7:28 AM  [name]  has presented today for surgery, with the diagnosis of DM II, GERD, Airway Hyperactivity, COPD, A-Fib, Renal Insufficiency, Anemia, HTN, Pannucilitis  The various methods of treatment have been discussed with the patient and family. After consideration of risks, benefits and other options for treatment, the patient has consented to  Procedure(s): PANNICULECTOMY (N/A) as a surgical intervention .  The patient's history has been reviewed, patient examined, no change in status, stable for surgery.  I have reviewed the patient's chart and labs.  Questions were answered to the patient's satisfaction.     Tuyen Uncapher B

## 2016-08-25 NOTE — Anesthesia Procedure Notes (Signed)
Procedure Name: Intubation Date/Time: 08/25/2016 7:49 AM Performed by: Deliah Boston Pre-anesthesia Checklist: Patient identified, Emergency Drugs available, Suction available and Patient being monitored Patient Re-evaluated:Patient Re-evaluated prior to induction Oxygen Delivery Method: Circle system utilized Preoxygenation: Pre-oxygenation with 100% oxygen Induction Type: IV induction Ventilation: Mask ventilation without difficulty Laryngoscope Size: Mac and 4 Grade View: Grade I Tube type: Oral Tube size: 7.5 mm Number of attempts: 1 Airway Equipment and Method: Stylet and Oral airway Placement Confirmation: ETT inserted through vocal cords under direct vision,  positive ETCO2 and breath sounds checked- equal and bilateral Secured at: 22 cm Tube secured with: Tape (paper tape) Dental Injury: Teeth and Oropharynx as per pre-operative assessment

## 2016-08-26 LAB — BASIC METABOLIC PANEL
ANION GAP: 9 (ref 5–15)
BUN: 31 mg/dL — ABNORMAL HIGH (ref 6–20)
CO2: 23 mmol/L (ref 22–32)
Calcium: 8 mg/dL — ABNORMAL LOW (ref 8.9–10.3)
Chloride: 103 mmol/L (ref 101–111)
Creatinine, Ser: 1.7 mg/dL — ABNORMAL HIGH (ref 0.61–1.24)
GFR calc non Af Amer: 38 mL/min — ABNORMAL LOW (ref >60)
GFR, EST AFRICAN AMERICAN: 44 mL/min — AB (ref >60)
GLUCOSE: 224 mg/dL — AB (ref 65–99)
POTASSIUM: 4.2 mmol/L (ref 3.5–5.1)
Sodium: 135 mmol/L (ref 135–145)

## 2016-08-26 LAB — CBC
HEMATOCRIT: 30.3 % — AB (ref 39.0–52.0)
HEMOGLOBIN: 10.3 g/dL — AB (ref 13.0–17.0)
MCH: 32.8 pg (ref 26.0–34.0)
MCHC: 34 g/dL (ref 30.0–36.0)
MCV: 96.5 fL (ref 78.0–100.0)
Platelets: 164 10*3/uL (ref 150–400)
RBC: 3.14 MIL/uL — AB (ref 4.22–5.81)
RDW: 13.8 % (ref 11.5–15.5)
WBC: 8 10*3/uL (ref 4.0–10.5)

## 2016-08-26 MED ORDER — HYDROCODONE-ACETAMINOPHEN 5-325 MG PO TABS
1.0000 | ORAL_TABLET | ORAL | Status: DC | PRN
  Administered 2016-08-26 – 2016-08-27 (×3): 1 via ORAL
  Filled 2016-08-26 (×4): qty 1

## 2016-08-26 NOTE — Progress Notes (Signed)
Patient ID: [name], male   DOB: 1940/11/09, 76 y.o.   MRN: [id number] [facility] Progress Note:   1 Day Post-Op  Subjective: Mental status is clear.  Feeling pretty good except oxycodone not helping-wants hydrocodoned Objective: Vital signs in last 24 hours: Temp:  [97.7 F (36.5 C)-98.3 F (36.8 C)] 98 F (36.7 C) (07/18 0540) Pulse Rate:  [73-95] 73 (07/18 0540) Resp:  [12-19] 16 (07/18 0540) BP: (99-135)/(61-82) 107/66 (07/18 0540) SpO2:  [99 %-100 %] 100 % (07/18 0540)  Intake/Output from previous day: 07/17 0701 - 07/18 0700 In: 3517.5 [P.O.:480; I.V.:3037.5] Out: 1055 [Urine:1045; Blood:10] Intake/Output this shift: No intake/output data recorded.  Physical Exam: Work of breathing is not labored  Lab Results:  Results for orders placed or performed during the hospital encounter of 08/25/16 (from the past 48 hour(s))  Glucose, capillary     Status: Abnormal   Collection Time: 08/25/16  7:18 AM  Result Value Ref Range   Glucose-Capillary 136 (H) 65 - 99 mg/dL  Glucose, capillary     Status: Abnormal   Collection Time: 08/25/16 10:33 AM  Result Value Ref Range   Glucose-Capillary 150 (H) 65 - 99 mg/dL  CBC     Status: Abnormal   Collection Time: 08/25/16  1:32 PM  Result Value Ref Range   WBC 7.6 4.0 - 10.5 K/uL   RBC 3.57 (L) 4.22 - 5.81 MIL/uL   Hemoglobin 11.6 (L) 13.0 - 17.0 g/dL   HCT 34.7 (L) 39.0 - 52.0 %   MCV 97.2 78.0 - 100.0 fL   MCH 32.5 26.0 - 34.0 pg   MCHC 33.4 30.0 - 36.0 g/dL   RDW 13.7 11.5 - 15.5 %   Platelets 168 150 - 400 K/uL  Creatinine, serum     Status: Abnormal   Collection Time: 08/25/16  1:32 PM  Result Value Ref Range   Creatinine, Ser 1.76 (H) 0.61 - 1.24 mg/dL   GFR calc non Af Amer 36 (L) >60 mL/min   GFR calc Af Amer 42 (L) >60 mL/min    Comment: (NOTE) The eGFR has been calculated using the CKD EPI equation. This calculation has not been validated in all clinical situations. eGFR's persistently <60  mL/min signify possible Chronic Kidney Disease.   CBC     Status: Abnormal   Collection Time: 08/26/16  5:26 AM  Result Value Ref Range   WBC 8.0 4.0 - 10.5 K/uL   RBC 3.14 (L) 4.22 - 5.81 MIL/uL   Hemoglobin 10.3 (L) 13.0 - 17.0 g/dL   HCT 30.3 (L) 39.0 - 52.0 %   MCV 96.5 78.0 - 100.0 fL   MCH 32.8 26.0 - 34.0 pg   MCHC 34.0 30.0 - 36.0 g/dL   RDW 13.8 11.5 - 15.5 %   Platelets 164 150 - 400 K/uL  Basic metabolic panel     Status: Abnormal   Collection Time: 08/26/16  5:26 AM  Result Value Ref Range   Sodium 135 135 - 145 mmol/L   Potassium 4.2 3.5 - 5.1 mmol/L   Chloride 103 101 - 111 mmol/L   CO2 23 22 - 32 mmol/L   Glucose, Bld 224 (H) 65 - 99 mg/dL   BUN 31 (H) 6 - 20 mg/dL   Creatinine, Ser 1.70 (H) 0.61 - 1.24 mg/dL   Calcium 8.0 (L) 8.9 - 10.3 mg/dL   GFR calc non Af Amer 38 (L) >60 mL/min   GFR calc Af Amer 44 (L) >60  mL/min    Comment: (NOTE) The eGFR has been calculated using the CKD EPI equation. This calculation has not been validated in all clinical situations. eGFR's persistently <60 mL/min signify possible Chronic Kidney Disease.    Anion gap 9 5 - 15   *Note: Due to a large number of results and/or encounters for the requested time period, some results have not been displayed. A complete set of results can be found in Results Review.    Radiology/Results: No results found.  Anti-infectives: Anti-infectives    Start     Dose/Rate Route Frequency Ordered Stop   08/25/16 2000  cefoTEtan (CEFOTAN) 2 g in dextrose 5 % 50 mL IVPB     2 g 100 mL/hr over 30 Minutes Intravenous Every 12 hours 08/25/16 1256 08/25/16 2240   08/25/16 0649  cefoTEtan in Dextrose 5% (CEFOTAN) 2-2.08 GM-% IVPB    Comments:  Heide Scales   : cabinet override      08/25/16 0649 08/25/16 0752   08/25/16 0550  cefoTEtan in Dextrose 5% (CEFOTAN) IVPB 2 g     2 g Intravenous On call to O.R. 08/25/16 0550 08/25/16 0752      Assessment/Plan: Problem List: Patient Active Problem  List   Diagnosis Date Noted  . S/P panniculectomy 08/25/2016  . Persistent cough for 3 weeks or longer 02/28/2016  . Internal hernia 03/08/2015  . GERD (gastroesophageal reflux disease) 08/03/2014  . Acid reflux 08/03/2014  . ED (erectile dysfunction) of organic origin 07/02/2014  . Type 2 diabetes mellitus with neurologic complication ([place]) 32/95/1884  . Counseling regarding end of life decision making 01/23/2014  . Dysphagia, pharyngoesophageal phase 08/30/2013  . Benign essential tremor 08/30/2013  . COPD, mild ([place]) 07/13/2013  . Postoperative pain, acute, shoulder 05/13/2013  . Arthralgia of shoulder 05/13/2013  . Cervical spinal stenosis 05/09/2013  . Encounter for therapeutic drug monitoring 04/26/2013  . 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 Hereditary and idiopathic neuropathy 12/11/2011  . 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 History of surgical procedure 09/03/2011  . Atrial fibrillation, permanent ([place]) 08/12/2011  . Atrial fibrillation ([place]) 08/12/2011  . Bacterial overgrowth syndrome 10/28/2010  . Nonsurgical dumping syndrome 10/14/2010  . 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 Diverticulitis of colon 09/02/2010  . Acute venous embolism and thrombosis of deep vessels of proximal lower extremity ([place]) 08/01/2010  . Vascular disorder of lower extremity 08/01/2010  . OSTEOPOROSIS, DRUG-INDUCED 11/26/2009  . Anemia 09/02/2009  . RENAL INSUFFICIENCY 09/02/2009  . Allergic rhinitis 01/17/2009  . ASTHMA, PERSISTENT, MILD 11/13/2008  . Airway hyperreactivity 11/13/2008  . KNEE REPLACEMENT, LEFT, HX OF 12/18/2006  . HERPES ZOSTER W/NERVOUS COMPLICATION NEC 16/60/6301  . HYPERCHOLESTEROLEMIA 05/19/2006  . ANXIETY 05/19/2006  . Major depressive disorder, recurrent, mild ([place]) 05/19/2006  . RESTLESS LEG SYNDROME 05/19/2006  . DIVERTICULOSIS, COLON 05/19/2006  . IRRITABLE BOWEL SYNDROME 05/19/2006  .  LOW BACK PAIN, CHRONIC 05/19/2006  . GAD (generalized anxiety disorder) 05/19/2006  . Diverticular disease of large intestine 05/19/2006  . Essential (primary) hypertension 05/19/2006  . Adaptive colitis 05/19/2006    Doing well thus far.  Hopeful discharge tomorrow 1 Day Post-Op    LOS: 1 day   Matt B. Hassell Done, MD, [facility] [phone] beeper [phone]  08/26/2016 8:43 AM

## 2016-08-26 NOTE — Op Note (Signed)
NAME:  ARIEON, CORCORAN NO.:  MEDICAL RECORD NO.:  [id number]  LOCATION:                                 FACILITY:  PHYSICIAN:  Isabel Caprice. Hassell Done, MD       DATE OF BIRTH:  DATE OF PROCEDURE:  08/25/2016 DATE OF DISCHARGE:                              OPERATIVE REPORT   INDICATIONS:  This is a 76 year old white male who is 5 years out from Roux-en-Y gastric bypass with greater than 100 pounds weight loss and with recurrent problems with panniculitis and skin breakdown beneath his pannus of his abdomen.  PROCEDURE:  Abdominal panniculectomy removing 1105 g specimen skin and subcutaneous tissue.  SURGEON:  Isabel Caprice. Hassell Done, MD  ASSISTANT:  Leighton Ruff. Redmond Pulling, MD, FACS  ANESTHESIA:  General endotracheal.  ESTIMATED BLOOD LOSS:  30 mL.  DESCRIPTION OF PROCEDURE:  The patient was seen in the holding area and marked in the standing position with an indelible marker.  We talked about removing his umbilicus as this was in the middle of the area to be removed and he gave permission.  He was then taken to the OR and given general anesthesia.  A time-out was performed.  He had been prepped with PCMX and draped widely.  Using the previous marks as guidelines, I excised an elliptical pannus making the incision with knife going carefully through his thin skin.  I went into the subcutaneous tissue and then used electrocautery to divide it down deeper into the subcutaneous tissue.  I then worked along down to the fascia and used the LigaSure device to transect the fat and control bleeding.  It is very effective of doing that, carried this across the midline and over to the right side.  We kept this moist with saline and once this was completed, we amputated the umbilicus, which did not seem to have any kind of hernia at the base and then I oversewed that with a figure-of- eight suture of 0 Vicryl.  We then closed the flaps in layers using 3-0 Vicryl pop-offs  approximating from the midline and going laterally. This resultant with 3-0s in the subcu and then the final layer was 4-0 nylon vertical mattress sutures.  The wound was then cleaned again and the Prevena long VAC was adhered to the incision and placed to suction. He seemed to tolerate the procedure well and was taken to the recovery room in satisfactory condition.     Isabel Caprice Hassell Done, MD    MBM/MEDQ  D:  08/25/2016  T:  08/25/2016  Job:  [id number]

## 2016-08-27 LAB — BASIC METABOLIC PANEL
Anion gap: 8 (ref 5–15)
BUN: 25 mg/dL — ABNORMAL HIGH (ref 6–20)
CALCIUM: 8.1 mg/dL — AB (ref 8.9–10.3)
CO2: 22 mmol/L (ref 22–32)
CREATININE: 1.39 mg/dL — AB (ref 0.61–1.24)
Chloride: 106 mmol/L (ref 101–111)
GFR, EST AFRICAN AMERICAN: 56 mL/min — AB (ref >60)
GFR, EST NON AFRICAN AMERICAN: 48 mL/min — AB (ref >60)
GLUCOSE: 155 mg/dL — AB (ref 65–99)
Potassium: 5 mmol/L (ref 3.5–5.1)
Sodium: 136 mmol/L (ref 135–145)

## 2016-08-27 LAB — CBC
HEMATOCRIT: 32 % — AB (ref 39.0–52.0)
Hemoglobin: 10.7 g/dL — ABNORMAL LOW (ref 13.0–17.0)
MCH: 32.4 pg (ref 26.0–34.0)
MCHC: 33.4 g/dL (ref 30.0–36.0)
MCV: 97 fL (ref 78.0–100.0)
PLATELETS: 164 10*3/uL (ref 150–400)
RBC: 3.3 MIL/uL — ABNORMAL LOW (ref 4.22–5.81)
RDW: 14 % (ref 11.5–15.5)
WBC: 5.8 10*3/uL (ref 4.0–10.5)

## 2016-08-27 MED ORDER — HYDROCODONE-ACETAMINOPHEN 5-325 MG PO TABS: 1.0000 | ORAL_TABLET | ORAL | 0 refills | Status: DC | PRN

## 2016-08-27 NOTE — Discharge Instructions (Signed)
May shower and use hair dryer to dry lower abdomen and perineum if needed.  Wear abdominal binder to support panniculectomy flaps

## 2016-08-27 NOTE — Discharge Planning (Addendum)
Patient IV removed and switch from [place] to [facility]. Discharge papers given, explained and educated.  Used teachback with wife in room to confirm understanding of home instructions and DC POC. Informed of FU appts and appts made - Dr. Diona Browner (PCP) 7/24 at 315PM and Dr. Hassell Done 8/1 10AM. Give signed pain scripts.  Patient not in pain at DC.  RN assessment and VS revealed stability for DC to home with [facility] in place.  Once ready, patient  will be wheeled to front and family transporting home via car.

## 2016-08-27 NOTE — Discharge Summary (Signed)
Physician Discharge Summary  Patient ID: [name] MRN: [id number] DOB/AGE: 1940/06/13 76 y.o.  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  No drains placed but Provena incisional VAC applied to incision.  Arrangements made to go home with this in place for no longer than 5 days.  Resumption of coumadin set at next Monday but patient asked to discuss with Dr. Diona Browner.  Ready for discharge on PD 2.    Consults: none  Significant Diagnostic Studies: none    Discharge Exam: Blood pressure 133/68, pulse 73, temperature 97.6 F (36.4 C), temperature source Oral, resp. rate 14, height 6\' 2"  (1.88 m), weight 88.9 kg (196 lb), SpO2 100 %. Incision covered with Provena incisional VAC  Disposition: 01-Home or Self Care  Discharge Instructions    Call MD for:  redness, tenderness, or signs of infection (pain, swelling, redness, odor or green/yellow discharge around incision site)    Complete by:  As directed    Diet - low sodium heart healthy    Complete by:  As directed    Discharge instructions    Complete by:  As directed    Discuss resumption of coumadin with your primary care physician but I would recommend waiting to restart next Monday. When the Provena pump stops (about 5 days) or when the dressing develops a big leak, go ahead and peel off the Provena dressing exposing the incisions and the sutures that are holding the edges of the skin together.  You can at that point shower and apply Neosporin to the incision line.  Continue to wear the abdominal binder for support.   Increase activity slowly    Complete by:  As directed       Allergies as of 08/27/2016      Reactions   Sulfacetamide Sodium Swelling   throat swelling   Adhesive [tape] Other (See Comments)   Tears skin-- "No tape of any kind, they all tear skin right off" Tears skin-- "No tape of any kind, they all tear skin right off"   Latex Itching      Medication List    TAKE these medications   acarbose 25 MG tablet Commonly known as:  PRECOSE Take 25 mg by mouth 2 (two) times daily.   acetaminophen 500 MG tablet Commonly known as:  TYLENOL Take 1,000 mg by mouth every 6 (six) hours as needed (for pain.).   atorvastatin 80 MG tablet Commonly known as:  LIPITOR Take 1 tablet by mouth at  bedtime   FLUoxetine 40 MG capsule Commonly known as:  PROZAC Take 1 capsule by mouth  daily   furosemide 20 MG tablet Commonly known as:  LASIX TAKE 1 TO 2 TABLETS BY  MOUTH DAILY What changed:  See the new instructions.   gabapentin 300 MG capsule Commonly known as:  NEURONTIN Take 1 capsule (300 mg total) by mouth 2 (two) times daily.   HYDROcodone-acetaminophen 5-325 MG tablet Commonly known as:  NORCO/VICODIN Take 1 tablet by mouth every 4 (four) hours as needed for moderate pain.   hydrocortisone 2.5 % cream APPLY ON THE SKIN TWICE A DAILY TO GROINS, ARMS, AND SIDES FOR RASH  lansoprazole 30 MG capsule Commonly known as:  PREVACID TAKE 1 CAPSULE (30 MG TOTAL) BY MOUTH DAILY BEFORE BREAKFAST.   lidocaine 5 % ointment Commonly known as:  XYLOCAINE Apply 1 application topically daily. APPLIED TO AFFECTED AREA OF FOOT   lisinopril 2.5 MG tablet Commonly known as:  PRINIVIL,ZESTRIL Take 2.5 mg by mouth daily.   METANX 3-90.314-2-35 MG Caps Take 1 capsule by mouth 2 (two) times daily.   moxifloxacin 400 MG tablet Commonly known as:  AVELOX Take 1 tablet (400 mg total) by mouth daily at 8 pm.   NONFORMULARY OR COMPOUNDED ITEM Apply 1 application topically daily. APPLY TO AFFECTED AREAS DAILY (DO NOT APPLY TO FACE, GROIN, UNDERARMS, OR  NECK)   nystatin cream Commonly known as:  MYCOSTATIN Apply 1 application topically daily. APPLIED AFTER BATHING   omeprazole 40 MG capsule Commonly known as:  PRILOSEC Take 40 mg by mouth at bedtime.   primidone 50 MG tablet Commonly known as:  MYSOLINE Take 2 tablets (100 mg total) by mouth 2 (two) times daily.   ranitidine 300 MG tablet Commonly known as:  ZANTAC Take 300 mg by mouth at bedtime.   silodosin 8 MG Caps capsule Commonly known as:  RAPAFLO Take 8 mg by mouth daily with breakfast.   sucralfate 1 g tablet Commonly known as:  CARAFATE Take 1 g by mouth daily before breakfast.   tamsulosin 0.4 MG Caps capsule Commonly known as:  FLOMAX Take 0.4 mg by mouth every evening.   warfarin 2.5 MG tablet Commonly known as:  COUMADIN TAKE 1 TO 1.5 TABLETS BY  MOUTH DAILY OR AS DIRECTED  BY COUMADIN CLINIC What changed:  See the new instructions.   zolpidem 10 MG tablet Commonly known as:  AMBIEN TAKE 1 TABLET AT BEDTIME AS NEEDED FOR SLEEP What changed:  See the new instructions.      Follow-up Information    Jinny Sanders, MD Follow up.   Specialty:  Family Medicine Why:  call and discuss timetable for resumption of coumadin.   Contact information: [place] [address] [phone]        [name], MD. Schedule an appointment as soon as possible for a visit in 2 week(s).   Specialty:  General Surgery Why:  suture removal in 2 weeks from the date of surgery.   Contact information: [place] [address] [phone]           Signed: Pedro Earls 08/27/2016, 8:41 AM

## 2016-08-28 ENCOUNTER — Telehealth: Payer: Self-pay

## 2016-08-28 NOTE — Telephone Encounter (Signed)
Patient unavailable, spoke with wife (Okay per DPR).  Transition Care Management Follow-up Telephone Call  Date discharged?  08/27/16   How have you been since you were released from the hospital? "he is doing very well"   Do you understand why you were in the hospital: Yes   Do you understand the discharge instructions? Yes, no questions or concerns.  He knows that he is to restart his coumadin on Monday 7/23 and then see PCP on Tuesday 09/01/16 for further instruction.   Where were you discharged to? Home   Items Reviewed:  Medications reviewed: Yes  Allergies reviewed: Yes  Dietary changes reviewed: No changes  Referrals reviewed: yes, patient is to see PCP on Tuesday 09/01/16.   Functional Questionnaire:   Activities of Daily Living (ADLs):   Wife reports patient is independent with all ADL's and does not require any assistance in the home.    Any transportation issues/concerns?: No   Any patient concerns? No   Confirmed importance and date/time of follow-up visits scheduled Yes  Provider Appointment booked with  Dr. Diona Browner for 09/01/16 at 3:15pm  Confirmed with patient if condition begins to worsen call PCP or go to the ER.  Patient was given the office number and encouraged to call back with question or concerns.  : Yes

## 2016-09-01 ENCOUNTER — Encounter: Payer: Self-pay | Admitting: Family Medicine

## 2016-09-01 ENCOUNTER — Ambulatory Visit: Payer: Medicare Other

## 2016-09-01 ENCOUNTER — Ambulatory Visit (INDEPENDENT_AMBULATORY_CARE_PROVIDER_SITE_OTHER): Payer: Medicare Other | Admitting: Family Medicine

## 2016-09-01 VITALS — BP 125/70 | HR 82 | Temp 98.1°F | Ht 74.0 in | Wt 189.5 lb

## 2016-09-01 DIAGNOSIS — Z5181 Encounter for therapeutic drug level monitoring: Secondary | ICD-10-CM

## 2016-09-01 DIAGNOSIS — I4821 Permanent atrial fibrillation: Secondary | ICD-10-CM

## 2016-09-01 DIAGNOSIS — I482 Chronic atrial fibrillation: Secondary | ICD-10-CM | POA: Diagnosis not present

## 2016-09-01 DIAGNOSIS — Z9889 Other specified postprocedural states: Secondary | ICD-10-CM | POA: Diagnosis not present

## 2016-09-01 LAB — PROTIME-INR
INR: 1.1 ratio — AB (ref 0.8–1.0)
Prothrombin Time: 12.3 s (ref 9.6–13.1)

## 2016-09-01 NOTE — Assessment & Plan Note (Signed)
Doing well.  No fever, minimal pain, drain draining clear.  nml BMs.  DM well controlled unless skipping meals. Back on coumadin.. Due for INR today.

## 2016-09-01 NOTE — Assessment & Plan Note (Signed)
Rate controlled and euvolemic on current regimen.  Now back on anticoagulation.

## 2016-09-01 NOTE — Progress Notes (Addendum)
   Subjective:    Patient ID: [name], male    DOB: 24-Apr-1940, 77 y.o.   MRN: [id number]  HPI  76 year old male presents for hospital follow up.  He presents after  panniculectomy on 7/17 by Dr.  Hassell Done. Discharged on 7/19 Restarted  Coumadin on 08/31/2016 TCM phone call on 7/20 reviewed.  He has had some decrease in appetitie.[name] occ low blood sugars. FBS in last few days 120-150 BM are back to normal. No fever.  minimal pain at site of surgery. Has drain in place.  no excessive bleeding, minimal discharge at wound pump.   Has follow up with surgeon tommorow.  Blood pressure 125/70, pulse 82, temperature 98.1 F (36.7 C), temperature source Oral, height 6\' 2"  (1.88 m), weight 189 lb 8 oz (86 kg).  Wt Readings from Last 3 Encounters:  09/01/16 189 lb 8 oz (86 kg)  08/25/16 196 lb (88.9 kg)  08/21/16 196 lb 8 oz (89.1 kg)   Body mass index is 24.33 kg/m.    Blood pressure 125/70, pulse 82, temperature 98.1 F (36.7 C), temperature source Oral, height 6\' 2"  (1.88 m), weight 189 lb 8 oz (86 kg).   Review of Systems  Constitutional: Negative for chills and fatigue.  HENT: Negative for ear pain.   Eyes: Negative for pain.  Respiratory: Negative for cough and shortness of breath.   Cardiovascular: Negative for chest pain, palpitations and leg swelling.       Objective:   Physical Exam  Constitutional: Vital signs are normal. He appears well-developed and well-nourished.  HENT:  Head: Normocephalic.  Right Ear: Hearing normal.  Left Ear: Hearing normal.  Nose: Nose normal.  Mouth/Throat: Oropharynx is clear and moist and mucous membranes are normal.  Neck: Trachea normal. Carotid bruit is not present. No thyroid mass and no thyromegaly present.  Cardiovascular: Normal rate and normal pulses.  An irregularly irregular rhythm present. Exam reveals no gallop, no distant heart sounds and no friction rub.   Murmur heard.  Systolic murmur is present  No  peripheral edema  Pulmonary/Chest: Effort normal and breath sounds normal. No respiratory distress.  Abdominal:  Drain and pressure bandage in place  Skin: Skin is warm, dry and intact. No rash noted.  Psychiatric: He has a normal mood and affect. His speech is normal and behavior is normal. Thought content normal.          Assessment & Plan:

## 2016-09-01 NOTE — Patient Instructions (Addendum)
Please stop at the lab to have labs drawn.  

## 2016-09-03 ENCOUNTER — Ambulatory Visit (INDEPENDENT_AMBULATORY_CARE_PROVIDER_SITE_OTHER): Payer: Medicare Other | Admitting: Cardiovascular Disease

## 2016-09-03 DIAGNOSIS — I824Y9 Acute embolism and thrombosis of unspecified deep veins of unspecified proximal lower extremity: Secondary | ICD-10-CM

## 2016-09-03 DIAGNOSIS — Z5181 Encounter for therapeutic drug level monitoring: Secondary | ICD-10-CM

## 2016-09-08 ENCOUNTER — Other Ambulatory Visit: Payer: Self-pay | Admitting: Family Medicine

## 2016-09-08 NOTE — Telephone Encounter (Signed)
If pt has not done it elsewhere.. Needs appt in next several months for AMW with labs prior.. Check cholesterol then. Refill sent in to last 3 months.

## 2016-09-08 NOTE — Telephone Encounter (Signed)
Last Lipid 06/05/2015.  Refill?

## 2016-09-09 NOTE — Telephone Encounter (Signed)
Sent message to Ebony Hail asking her to contact patient as set him up for his [place] with fasting labs prior.

## 2016-09-10 ENCOUNTER — Ambulatory Visit (INDEPENDENT_AMBULATORY_CARE_PROVIDER_SITE_OTHER): Payer: Medicare Other | Admitting: *Deleted

## 2016-09-10 DIAGNOSIS — L570 Actinic keratosis: Secondary | ICD-10-CM | POA: Diagnosis not present

## 2016-09-10 DIAGNOSIS — Z85828 Personal history of other malignant neoplasm of skin: Secondary | ICD-10-CM | POA: Diagnosis not present

## 2016-09-10 DIAGNOSIS — I4891 Unspecified atrial fibrillation: Secondary | ICD-10-CM | POA: Diagnosis not present

## 2016-09-10 DIAGNOSIS — Z7901 Long term (current) use of anticoagulants: Secondary | ICD-10-CM

## 2016-09-10 DIAGNOSIS — I824Y9 Acute embolism and thrombosis of unspecified deep veins of unspecified proximal lower extremity: Secondary | ICD-10-CM | POA: Diagnosis not present

## 2016-09-10 DIAGNOSIS — Z5181 Encounter for therapeutic drug level monitoring: Secondary | ICD-10-CM | POA: Diagnosis not present

## 2016-09-10 DIAGNOSIS — A630 Anogenital (venereal) warts: Secondary | ICD-10-CM | POA: Diagnosis not present

## 2016-09-10 DIAGNOSIS — L821 Other seborrheic keratosis: Secondary | ICD-10-CM | POA: Diagnosis not present

## 2016-09-10 LAB — POCT INR: INR: 3.1

## 2016-09-10 NOTE — Progress Notes (Signed)
Subjective:   [name] is a 76 y.o. male who presents for Medicare Annual/Subsequent preventive examination.  Review of Systems:  No ROS.  Medicare Wellness Visit. Additional risk factors are reflected in the social history.  Cardiac Risk Factors include: advanced age (>65men, >20 women);diabetes mellitus;hypertension;male gender     Objective:    Vitals: BP 108/62 (BP Location: Right Arm, Patient Position: Sitting, Cuff Size: Normal)   Pulse 75   Resp 16   Ht 6' 0.05" (1.83 m)   Wt 191 lb (86.6 kg)   SpO2 100%   BMI 25.87 kg/m   Body mass index is 25.87 kg/m.  Tobacco History  Smoking Status  . Never Smoker  Smokeless Tobacco  . Never Used     Counseling given: Not Answered   Past Medical History:  Diagnosis Date  . Anxiety state, unspecified   . Arthritis    hands   . Bruises easily    pt is on Coumadin  . Cancer ([place])    SKIN CANCER REMOVED  . Chronic atrial fibrillation (HCC)    a.fib on warfarin- Dr. Percival Spanish follows  . Depressive disorder, not elsewhere classified    takes Prozac daily  . Diverticulosis of colon (without mention of hemorrhage)   . ED (erectile dysfunction)   . GERD (gastroesophageal reflux disease)   . Hard of hearing    wears hearing aids  . History of blood clots    behind right knee and then went into left lung 63yrs ago  . History of colon polyps   . History of kidney stones   . Hx of diabetes mellitus    "no longer diabetic since gastric bypass" - no longer taking metformin"  in 2 months "problems with low blood sugar now"  . Hx of gout    but doesn't take any meds  . Inflammation of colonic mucosa    recent admission and release from [facility] 02-28-15-remains on oral antibiotic  . Joint pain   . Peripheral edema    takes Furosemide daily  . Peripheral vascular disease ([place])   . Pneumonia    last time about 30yrs ago  . Pulmonary embolism ([place])    over 10 yrs ago "many years ago"  . Restless legs syndrome (RLS)     . Unspecified asthma(493.90)    as a child  . Unspecified essential hypertension    hx of no longer on medication due to gastric bypass    Past Surgical History:  Procedure Laterality Date  . ANTERIOR CERVICAL DECOMP/DISCECTOMY FUSION N/A 05/09/2013   Procedure: ANTERIOR CERVICAL DECOMPRESSION/DISCECTOMY FUSION CERVICAL THREE-FOUR,CERVICAL FOUR-FIVE,CERVICAL FIVE-SIX;  Surgeon: Floyce Stakes, MD;  Location: [facility];  Service: Neurosurgery;  Laterality: N/A;  . ARTERY REPAIR     Left forearm  . BACK SURGERY     x 3  . BREATH TEK H PYLORI  01/08/2011   Procedure: BREATH TEK H PYLORI;  Surgeon: Pedro Earls, MD;  Location: [facility];  Service: General;  Laterality: N/A;  . CARDIOVERSION     x 2 attempts-unsuccessful.  [name] CATARACT EXTRACTION, BILATERAL    . COLONOSCOPY    . CYSTOSCOPY    . ESOPHAGOGASTRODUODENOSCOPY (EGD) WITH PROPOFOL N/A 09/21/2014   Procedure: ESOPHAGOGASTRODUODENOSCOPY (EGD) WITH PROPOFOL;  Surgeon: Manya Silvas, MD;  Location: [facility] ENDOSCOPY;  Service: Endoscopy;  Laterality: N/A;  . EYE SURGERY     cataract bil  . FOOT SURGERY     Left foot   .  GASTRIC ROUX-EN-Y  08/11/2011   Procedure: LAPAROSCOPIC ROUX-EN-Y GASTRIC BYPASS WITH UPPER ENDOSCOPY;  Surgeon: Pedro Earls, MD;  Location: [facility] ORS;  Service: General;  Laterality: N/A;  . HAND SURGERY     LEFT  . injections in back     x 18  . KNEE SURGERY Right    x 4  . KNEE SURGERY Left    arthroscopy  . LAPAROSCOPIC INTERNAL HERNIA REPAIR N/A 03/08/2015   Procedure: LAPAROSCOPIC INTERNAL HERNIA REPAIR ;  Surgeon: [name], MD;  Location: [facility]S;  Service: General;  Laterality: N/A;  . LEG SURGERY     FOR NECROTIZING FASCITIS L LEG AND GROIN  . PANNICULECTOMY N/A 08/25/2016   Procedure: PANNICULECTOMY;  Surgeon: [name], MD;  Location: [facility]S;  Service: General;  Laterality: N/A;  . PENILE PROSTHESIS IMPLANT N/A 07/02/2014   Procedure: PENILE PROTHESIS INFLATABLE 3 PIECE  (COLOPLAST) SCROTAL APPROACH;  Surgeon: Kathie Rhodes, MD;  Location: [facility];  Service: Urology;  Laterality: N/A;  . PENILE PROSTHESIS IMPLANT N/A 11/23/2014   Procedure: EXPLORATION AND REVISION OF PENILE PROSTHESIS;  Surgeon: Kathie Rhodes, MD;  Location: [facility];  Service: Urology;  Laterality: N/A;  . REPLACEMENT TOTAL KNEE Left    x 2  . SHOULDER ARTHROSCOPY    . SHOULDER SURGERY Left 2014  . TONSILLECTOMY    . TOTAL SHOULDER ARTHROPLASTY Left 01/26/2013   Procedure: TOTAL SHOULDER ARTHROPLASTY;  Surgeon: Nita Sells, MD;  Location: [facility];  Service: Orthopedics;  Laterality: Left;  Left total shoulder arthroplasty   Family History  Problem Relation Age of Onset  . Uterine cancer Mother        mets  . Breast cancer Mother   . Cancer Mother        cervical  . Emphysema Father   . Alzheimer's disease Sister   . Prostate cancer Brother   . Heart attack Brother   . Colon cancer Brother 19   History  Sexual Activity  . Sexual activity: Yes    Outpatient Encounter Prescriptions as of 09/11/2016  Medication Sig  . acarbose (PRECOSE) 25 MG tablet Take 25 mg by mouth 2 (two) times daily.   [name] acetaminophen (TYLENOL) 500 MG tablet Take 1,000 mg by mouth every 6 (six) hours as needed (for pain.).  [name] atorvastatin (LIPITOR) 80 MG tablet TAKE 1 TABLET BY MOUTH AT  BEDTIME  . FLUoxetine (PROZAC) 40 MG capsule TAKE 1 CAPSULE BY MOUTH  DAILY  . furosemide (LASIX) 20 MG tablet TAKE 1 TO 2 TABLETS BY  MOUTH DAILY  . gabapentin (NEURONTIN) 300 MG capsule Take 1 capsule (300 mg total) by mouth 2 (two) times daily.  [name] HYDROcodone-acetaminophen (NORCO/VICODIN) 5-325 MG tablet Take 1 tablet by mouth every 4 (four) hours as needed for moderate pain.  . hydrocortisone 2.5 % cream APPLY ON THE SKIN TWICE A DAILY TO GROINS, ARMS, AND SIDES FOR RASH  . L-Methylfolate-Algae-B12-B6 (METANX) 3-90.314-2-35 MG CAPS Take 1 capsule by mouth 2 (two) times daily.  . lansoprazole (PREVACID) 30 MG capsule  TAKE 1 CAPSULE (30 MG TOTAL) BY MOUTH DAILY BEFORE BREAKFAST.  [name] lidocaine (XYLOCAINE) 5 % ointment Apply 1 application topically daily. APPLIED TO AFFECTED AREA OF FOOT  . lisinopril (PRINIVIL,ZESTRIL) 2.5 MG tablet Take 2.5 mg by mouth daily.   [name] moxifloxacin (AVELOX) 400 MG tablet Take 1 tablet (400 mg total) by mouth daily at 8 pm.  . NONFORMULARY OR COMPOUNDED ITEM Apply 1 application topically daily. APPLY TO  AFFECTED AREAS DAILY (DO NOT APPLY TO FACE, GROIN, UNDERARMS, OR NECK)  . nystatin cream (MYCOSTATIN) Apply 1 application topically daily. APPLIED AFTER BATHING  . omeprazole (PRILOSEC) 40 MG capsule TAKE 1 CAPSULE BY MOUTH  DAILY  . primidone (MYSOLINE) 50 MG tablet Take 2 tablets (100 mg total) by mouth 2 (two) times daily.  . ranitidine (ZANTAC) 300 MG tablet Take 300 mg by mouth at bedtime.   . silodosin (RAPAFLO) 8 MG CAPS capsule Take 8 mg by mouth daily with breakfast.   . sucralfate (CARAFATE) 1 g tablet Take 1 g by mouth daily before breakfast.   . triamcinolone cream (KENALOG) 0.1 % APPLY TO AFFECTED AREA TWICE A DAY  . warfarin (COUMADIN) 2.5 MG tablet TAKE 1 TO 1.5 TABLETS BY  MOUTH DAILY OR AS DIRECTED  BY COUMADIN CLINIC (Patient taking differently: TAKE 1 TABLET (2.5 MG) ON SUNDAY, [place]. TAKE 1.5 TABLETS (3.75 MG) BY MOUTH ON MONDAY, WEDNESDAY, FRIDAY, & SATURDAY.)  . zolpidem (AMBIEN) 10 MG tablet TAKE 1 TABLET AT BEDTIME AS NEEDED FOR SLEEP (Patient taking differently: TAKE 1 TABLET (10 MG) AT BEDTIME NIGHTLY)   No facility-administered encounter medications on file as of 09/11/2016.     Activities of Daily Living In your present state of health, do you have any difficulty performing the following activities: 09/11/2016 08/25/2016  Hearing? N -  [place]? N -  Difficulty concentrating or making decisions? N -  Walking or climbing stairs? N -  Dressing or bathing? N -  Doing errands, shopping? N Y  Conservation officer, nature and eating ? N -  Using the  Toilet? N -  In the past six months, have you accidently leaked urine? N -  Do you have problems with loss of bowel control? N -  Managing your Medications? N -  Managing your Finances? N -  Housekeeping or managing your Housekeeping? N -  Some recent data might be hidden    Patient Care Team: Jinny Sanders, MD as PCP - General Himmelrich, Bryson Ha, RD (Inactive) as Dietitian (Bariatrics) Melida Quitter, MD as Consulting Physician (Otolaryngology) Delrae Rend, MD as Consulting Physician (Endocrinology) Minus Breeding, MD as Consulting Physician (Cardiology) Kayleen Memos, PA-C as Physician Assistant (Gastroenterology) Brand Males, MD as Consulting Physician (Pulmonary Disease) Malena Catholic, MD as Referring Physician Leeroy Cha, MD as Consulting Physician (Neurosurgery) Tat, Eustace Quail, DO as Consulting Physician (Neurology) Hollice Espy, MD as Consulting Physician (Urology) Albertine Patricia, DPM as Attending Physician (Podiatry) Jola Schmidt, MD as Consulting Physician (Ophthalmology) Melrose Nakayama, MD as Consulting Physician (Orthopedic Surgery)   Assessment:    Physical assessment deferred to PCP.  Exercise Activities and Dietary recommendations Current Exercise Habits: Home exercise routine, Type of exercise: walking, Time (Minutes): 20, Frequency (Times/Week): 7, Weekly Exercise (Minutes/Week): 140, Intensity: Mild, Exercise limited by: None identified  Goals    . Weight < 181 lb (82.101 kg)          Target weight is 180 lbs. Starting 06/05/2015, I will limit intake of sweets, bread, and potatoes to 1 serving of each per week.  09/11/2016 Goal weight 180-190 lb.       Fall Risk Fall Risk  09/11/2016 05/19/2016 09/30/2015 06/05/2015 01/23/2014  Falls in the past year? No No Yes No Yes  Number falls in past yr: - - 1 - 2 or more  Injury with Fall? - - No - -  Follow up - - Falls evaluation completed - -  Depression Screen PHQ 2/9 Scores 09/11/2016  06/05/2015 01/23/2014  PHQ - 2 Score 0 0 0    Cognitive Function PLEASE NOTE: A Mini-Cog screen was completed. Maximum score is 20. A value of 0 denotes this part of [place] MMSE was not completed or the patient failed this part of the Mini-Cog screening.   Mini-Cog Screening Orientation to Time - Max 5 pts Orientation to Place - Max 5 pts Registration - Max 3 pts Recall - Max 3 pts Language Repeat - Max 1 pts Language Follow 3 Step Command - Max 3 pts      Mini-Cog - 09/11/16 1116    Normal clock drawing test? yes   How many words correct? 2      MMSE - Mini Mental State Exam 09/11/2016 06/05/2015  Orientation to time 5 5  Orientation to Place 5 5  Registration 3 3  Attention/ Calculation 0 0  Recall 3 3  Language- name 2 objects 0 0  Language- repeat 1 1  Language- follow 3 step command 3 3  Language- read & follow direction 0 0  Write a sentence - 0  Copy design 0 0  Total score - 20        Immunization History  Administered Date(s) Administered  . Influenza Whole 11/09/2005, 11/09/2008, 11/10/2010  . Influenza, High Dose Seasonal PF 12/08/2013, 11/02/2015  . Pneumococcal Conjugate-13 12/22/2013  . Pneumococcal Polysaccharide-23 02/10/2003, 11/09/2008, 11/02/2015  . Td 02/09/2005  . Tdap 06/06/2015  . Zoster 06/21/2015   Screening Tests Health Maintenance  Topic Date Due  . FOOT EXAM  06/12/2016  . COLONOSCOPY  07/10/2016  . INFLUENZA VACCINE  09/09/2016  . OPHTHALMOLOGY EXAM  09/22/2016  . HEMOGLOBIN A1C  02/21/2017  . TETANUS/TDAP  06/05/2025  . PNA vac Low Risk Adult  Completed      Plan:   Follow up with PCP as directed.  I have personally reviewed and noted the following in the patient's chart:   . Medical and social history . Use of alcohol, tobacco or illicit drugs  . Current medications and supplements . Functional ability and status . Nutritional status . Physical activity . Advanced directives . List of other  physicians . Vitals . Screenings to include cognitive, depression, and falls . Referrals and appointments  In addition, I have reviewed and discussed with patient certain preventive protocols, quality metrics, and best practice recommendations. A written personalized care plan for preventive services as well as general preventive health recommendations were provided to patient.     Ree Edman, RN  09/11/2016

## 2016-09-10 NOTE — Progress Notes (Signed)
PCP notes:   Health maintenance: Foot exam - due  Colonoscopy - due. Pt has an appt this month.   Abnormal screenings: None   Patient concerns: None.   Nurse concerns: None.   Next PCP appt: 10/01/2016.

## 2016-09-11 ENCOUNTER — Ambulatory Visit (INDEPENDENT_AMBULATORY_CARE_PROVIDER_SITE_OTHER): Payer: Medicare Other

## 2016-09-11 ENCOUNTER — Other Ambulatory Visit (INDEPENDENT_AMBULATORY_CARE_PROVIDER_SITE_OTHER): Payer: Medicare Other

## 2016-09-11 ENCOUNTER — Telehealth: Payer: Self-pay | Admitting: Family Medicine

## 2016-09-11 VITALS — BP 108/62 | HR 75 | Resp 16 | Ht 72.05 in | Wt 191.0 lb

## 2016-09-11 DIAGNOSIS — E7800 Pure hypercholesterolemia, unspecified: Secondary | ICD-10-CM | POA: Diagnosis not present

## 2016-09-11 DIAGNOSIS — Z0000 Encounter for general adult medical examination without abnormal findings: Secondary | ICD-10-CM

## 2016-09-11 LAB — COMPREHENSIVE METABOLIC PANEL
ALBUMIN: 4 g/dL (ref 3.5–5.2)
ALK PHOS: 64 U/L (ref 39–117)
ALT: 37 U/L (ref 0–53)
AST: 26 U/L (ref 0–37)
BILIRUBIN TOTAL: 0.3 mg/dL (ref 0.2–1.2)
BUN: 23 mg/dL (ref 6–23)
CHLORIDE: 107 meq/L (ref 96–112)
CO2: 26 mEq/L (ref 19–32)
CREATININE: 1.6 mg/dL — AB (ref 0.40–1.50)
Calcium: 8.6 mg/dL (ref 8.4–10.5)
GFR: 45.02 mL/min — ABNORMAL LOW (ref >60.00)
Glucose, Bld: 122 mg/dL — ABNORMAL HIGH (ref 70–99)
Potassium: 4.5 mEq/L (ref 3.5–5.1)
SODIUM: 138 meq/L (ref 135–145)
TOTAL PROTEIN: 6.5 g/dL (ref 6.0–8.3)

## 2016-09-11 LAB — LIPID PANEL
CHOLESTEROL: 130 mg/dL (ref 0–200)
HDL: 54 mg/dL (ref >39.00)
LDL Cholesterol: 61 mg/dL (ref 0–99)
NonHDL: 76.18
TRIGLYCERIDES: 77 mg/dL (ref 0.0–149.0)
Total CHOL/HDL Ratio: 2
VLDL: 15.4 mg/dL (ref 0.0–40.0)

## 2016-09-11 NOTE — Progress Notes (Signed)
I reviewed health advisor's note, was available for consultation, and agree with documentation and plan.  

## 2016-09-11 NOTE — Patient Instructions (Addendum)
[name] ,  Bring a copy of your advance directives to your next office visit.   Thank you for taking time to come for your Medicare Wellness Visit. I appreciate your ongoing commitment to your health goals. Please review the following plan we discussed and let me know if I can assist you in the future.   These are the goals we discussed: Goals    . Weight < 181 lb (82.101 kg)          Target weight is 180 lbs. Starting 06/05/2015, I will limit intake of sweets, bread, and potatoes to 1 serving of each per week.  09/11/2016 Goal weight 180-190 lb.        This is a list of the screening recommended for you and due dates:  Health Maintenance  Topic Date Due  . Complete foot exam   06/12/2016  . Colon Cancer Screening  07/10/2016  . Flu Shot  09/09/2016  . Eye exam for diabetics  09/22/2016  . Hemoglobin A1C  02/21/2017  . Tetanus Vaccine  06/05/2025  . Pneumonia vaccines  Completed   Preventive Care for Adults  A healthy lifestyle and preventive care can promote health and wellness. Preventive health guidelines for adults include the following key practices.  . A routine yearly physical is a good way to check with your health care provider about your health and preventive screening. It is a chance to share any concerns and updates on your health and to receive a thorough exam.  . Visit your dentist for a routine exam and preventive care every 6 months. Brush your teeth twice a day and floss once a day. Good oral hygiene prevents tooth decay and gum disease.  . The frequency of eye exams is based on your age, health, family medical history, use  of contact lenses, and other factors. Follow your health care provider's ecommendations for frequency of eye exams.  . Eat a healthy diet. Foods like vegetables, fruits, whole grains, low-fat dairy products, and lean protein foods contain the nutrients you need without too many calories. Decrease your intake of foods high in solid fats,  added sugars, and salt. Eat the right amount of calories for you. Get information about a proper diet from your health care provider, if necessary.  . Regular physical exercise is one of the most important things you can do for your health. Most adults should get at least 150 minutes of moderate-intensity exercise (any activity that increases your heart rate and causes you to sweat) each week. In addition, most adults need muscle-strengthening exercises on 2 or more days a week.  Silver Sneakers may be a benefit available to you. To determine eligibility, you may visit the website: www.silversneakers.com or contact program at [phone])275-4450 Mon-Fri between 8AM-8PM.   . Maintain a healthy weight. The body mass index (BMI) is a screening tool to identify possible weight problems. It provides an estimate of body fat based on height and weight. Your health care provider can find your BMI and can help you achieve or maintain a healthy weight.   For adults 20 years and older: ? A BMI below 18.5 is considered underweight. ? A BMI of 18.5 to 24.9 is normal. ? A BMI of 25 to 29.9 is considered overweight. ? A BMI of 30 and above is considered obese.   . Maintain normal blood lipids and cholesterol levels by exercising and minimizing your intake of saturated fat. Eat a balanced diet with plenty of fruit and  vegetables. Blood tests for lipids and cholesterol should begin at age 65 and be repeated every 5 years. If your lipid or cholesterol levels are high, you are over 50, or you are at high risk for heart disease, you may need your cholesterol levels checked more frequently. Ongoing high lipid and cholesterol levels should be treated with medicines if diet and exercise are not working.  . If you smoke, find out from your health care provider how to quit. If you do not use tobacco, please do not start.  . If you choose to drink alcohol, please do not consume more than 2 drinks per day. One drink is  considered to be 12 ounces (355 mL) of beer, 5 ounces (148 mL) of wine, or 1.5 ounces (44 mL) of liquor.  . If you are 35-70 years old, ask your health care provider if you should take aspirin to prevent strokes.  . Use sunscreen. Apply sunscreen liberally and repeatedly throughout the day. You should seek shade when your shadow is shorter than you. Protect yourself by wearing long sleeves, pants, a wide-brimmed hat, and sunglasses year round, whenever you are outdoors.  . Once a month, do a whole body skin exam, using a mirror to look at the skin on your back. Tell your health care provider of new moles, moles that have irregular borders, moles that are larger than a pencil eraser, or moles that have changed in shape or color.

## 2016-09-11 NOTE — Telephone Encounter (Signed)
-----   Message from Ellamae Sia sent at 09/09/2016  5:41 PM EDT ----- Regarding: Lab orders for Friday, 8.3.18  AWV lab orders, please.

## 2016-09-14 ENCOUNTER — Encounter: Payer: Self-pay | Admitting: Family Medicine

## 2016-09-14 ENCOUNTER — Ambulatory Visit (INDEPENDENT_AMBULATORY_CARE_PROVIDER_SITE_OTHER): Payer: Medicare Other | Admitting: Family Medicine

## 2016-09-14 ENCOUNTER — Other Ambulatory Visit: Payer: Self-pay | Admitting: Family Medicine

## 2016-09-14 VITALS — BP 112/70 | HR 80 | Temp 97.8°F | Ht 72.0 in | Wt 193.8 lb

## 2016-09-14 DIAGNOSIS — J449 Chronic obstructive pulmonary disease, unspecified: Secondary | ICD-10-CM | POA: Diagnosis not present

## 2016-09-14 DIAGNOSIS — Z9889 Other specified postprocedural states: Secondary | ICD-10-CM

## 2016-09-14 DIAGNOSIS — Z7901 Long term (current) use of anticoagulants: Secondary | ICD-10-CM | POA: Diagnosis not present

## 2016-09-14 DIAGNOSIS — E1142 Type 2 diabetes mellitus with diabetic polyneuropathy: Secondary | ICD-10-CM | POA: Diagnosis not present

## 2016-09-14 DIAGNOSIS — J209 Acute bronchitis, unspecified: Secondary | ICD-10-CM | POA: Diagnosis not present

## 2016-09-14 MED ORDER — DOXYCYCLINE HYCLATE 100 MG PO TABS: 100.0000 mg | ORAL_TABLET | Freq: Two times a day (BID) | ORAL | 0 refills | Status: DC

## 2016-09-14 NOTE — Assessment & Plan Note (Signed)
Good O2 sats, no wheezing on exam. anticipate respiratory infection/bronchitis. Short duration but with significant comorbidities (DM, COPD, h/o PNA and PE, coumadin use). Also complicating picture is current use of augmentin for recent wound infection s/p panniculectomy. Will treat aggressively with doxy to cover atypicals. Pt agrees with plan. rec OTC cough syrup for night time. He does have close f/u with PCP.

## 2016-09-14 NOTE — Assessment & Plan Note (Signed)
He will call coumadin clinic for dosing while on doxycycline.

## 2016-09-14 NOTE — Progress Notes (Signed)
BP 112/70 (BP Location: Right Arm, Patient Position: Sitting, Cuff Size: Normal)   Pulse 80   Temp 97.8 F (36.6 C) (Oral)   Ht 6' (1.829 m)   Wt 193 lb 12.8 oz (87.9 kg)   SpO2 97%   BMI 26.28 kg/m    CC: cough Subjective:    Patient ID: DOC MANDALA, male    DOB: January 09, 1941, 76 y.o.   MRN: [id number]  HPI: [name] is a 76 y.o. male presenting on 09/14/2016 for Cough (mucus draining to his chest, yellow mucus, clearing his throat alot, chest tightness Denies wheezing, )   3d h/o ST that has progressed to cough. Breathing more labored last night, increased chest congested increased cough recently. Cough productive of yellow sputum. + chest > head congestion. +PNDrainage. Trouble sleeping due to cough.   No fevers/chills, ear or tooth pain, headaches.  No sick contacts at home.  Non smoker + childhood asthma. Carries dx mild COPD.  Treating with OTC remedies without improvement.   H/o PNA, remote h/o PE.  On chronic coumadin for afib. Followed by cardiology coumadin clinic.   Zpack doesn't work for him.  Currently on 7d augmentin course after panniculectomy 3 wks ago complicated by wound infection (Dr Hassell Done).   Relevant past medical, surgical, family and social history reviewed and updated as indicated. Interim medical history since our last visit reviewed. Allergies and medications reviewed and updated. Outpatient Medications Prior to Visit  Medication Sig Dispense Refill  . acarbose (PRECOSE) 25 MG tablet Take 25 mg by mouth 2 (two) times daily.     [name] acetaminophen (TYLENOL) 500 MG tablet Take 1,000 mg by mouth every 6 (six) hours as needed (for pain.).    [name] atorvastatin (LIPITOR) 80 MG tablet TAKE 1 TABLET BY MOUTH AT  BEDTIME 90 tablet 0  . FLUoxetine (PROZAC) 40 MG capsule TAKE 1 CAPSULE BY MOUTH  DAILY 90 capsule 1  . furosemide (LASIX) 20 MG tablet TAKE 1 TO 2 TABLETS BY  MOUTH DAILY 180 tablet 1  . gabapentin (NEURONTIN) 300 MG capsule Take 1 capsule  (300 mg total) by mouth 2 (two) times daily. 180 capsule 1  . HYDROcodone-acetaminophen (NORCO/VICODIN) 5-325 MG tablet Take 1 tablet by mouth every 4 (four) hours as needed for moderate pain. 30 tablet 0  . hydrocortisone 2.5 % cream APPLY ON THE SKIN TWICE A DAILY TO GROINS, ARMS, AND SIDES FOR RASH  4  . L-Methylfolate-Algae-B12-B6 (METANX) 3-90.314-2-35 MG CAPS Take 1 capsule by mouth 2 (two) times daily.    . lansoprazole (PREVACID) 30 MG capsule TAKE 1 CAPSULE (30 MG TOTAL) BY MOUTH DAILY BEFORE BREAKFAST.  5  . lidocaine (XYLOCAINE) 5 % ointment Apply 1 application topically daily. APPLIED TO AFFECTED AREA OF FOOT  2  . lisinopril (PRINIVIL,ZESTRIL) 2.5 MG tablet Take 2.5 mg by mouth daily.     . NONFORMULARY OR COMPOUNDED ITEM Apply 1 application topically daily. APPLY TO AFFECTED AREAS DAILY (DO NOT APPLY TO FACE, GROIN, UNDERARMS, OR NECK)  2  . nystatin cream (MYCOSTATIN) Apply 1 application topically daily. APPLIED AFTER BATHING    . omeprazole (PRILOSEC) 40 MG capsule TAKE 1 CAPSULE BY MOUTH  DAILY 90 capsule 1  . primidone (MYSOLINE) 50 MG tablet Take 2 tablets (100 mg total) by mouth 2 (two) times daily. 360 tablet 1  . ranitidine (ZANTAC) 300 MG tablet Take 300 mg by mouth at bedtime.     . silodosin (RAPAFLO) 8 MG CAPS capsule  Take 8 mg by mouth daily with breakfast.     . sucralfate (CARAFATE) 1 g tablet Take 1 g by mouth daily before breakfast.     . triamcinolone cream (KENALOG) 0.1 % APPLY TO AFFECTED AREA TWICE A DAY  2  . warfarin (COUMADIN) 2.5 MG tablet TAKE 1 TO 1.5 TABLETS BY  MOUTH DAILY OR AS DIRECTED  BY COUMADIN CLINIC (Patient taking differently: TAKE 1 TABLET (2.5 MG) ON SUNDAY, TUESDAY & THURSDAYS. TAKE 1.5 TABLETS (3.75 MG) BY MOUTH ON MONDAY, WEDNESDAY, FRIDAY, & SATURDAY.) 135 tablet 1  . zolpidem (AMBIEN) 10 MG tablet TAKE 1 TABLET AT BEDTIME AS NEEDED FOR SLEEP (Patient taking differently: TAKE 1 TABLET (10 MG) AT BEDTIME NIGHTLY) 30 tablet 1  . moxifloxacin  (AVELOX) 400 MG tablet Take 1 tablet (400 mg total) by mouth daily at 8 pm. 7 tablet 0   No facility-administered medications prior to visit.      Per HPI unless specifically indicated in ROS section below Review of Systems     Objective:    BP 112/70 (BP Location: Right Arm, Patient Position: Sitting, Cuff Size: Normal)   Pulse 80   Temp 97.8 F (36.6 C) (Oral)   Ht 6' (1.829 m)   Wt 193 lb 12.8 oz (87.9 kg)   SpO2 97%   BMI 26.28 kg/m   Wt Readings from Last 3 Encounters:  09/14/16 193 lb 12.8 oz (87.9 kg)  09/11/16 191 lb (86.6 kg)  09/01/16 189 lb 8 oz (86 kg)    Physical Exam  Constitutional: He appears well-developed and well-nourished. No distress.  HENT:  Head: Normocephalic and atraumatic.  Right Ear: Hearing, tympanic membrane, external ear and ear canal normal.  Left Ear: Hearing, tympanic membrane, external ear and ear canal normal.  Nose: No mucosal edema or rhinorrhea. Right sinus exhibits frontal sinus tenderness. Right sinus exhibits no maxillary sinus tenderness. Left sinus exhibits frontal sinus tenderness. Left sinus exhibits no maxillary sinus tenderness.  Mouth/Throat: Uvula is midline, oropharynx is clear and moist and mucous membranes are normal. No oropharyngeal exudate, posterior oropharyngeal edema, posterior oropharyngeal erythema or tonsillar abscesses.  Nasal congestion and dryness  Eyes: Pupils are equal, round, and reactive to light. Conjunctivae and EOM are normal. No scleral icterus.  Neck: Normal range of motion. Neck supple.  Cardiovascular: Normal rate, regular rhythm, normal heart sounds and intact distal pulses.   No murmur heard. Pulmonary/Chest: Effort normal and breath sounds normal. No respiratory distress. He has no wheezes. He has no rales.  Lymphadenopathy:    He has no cervical adenopathy.  Skin: Skin is warm and dry. No rash noted.  Nursing note and vitals reviewed.  Results for orders placed or performed in visit on 09/11/16    Lipid panel  Result Value Ref Range   Cholesterol 130 0 - 200 mg/dL   Triglycerides 77.0 0.0 - 149.0 mg/dL   HDL 54.00 >39.00 mg/dL   VLDL 15.4 0.0 - 40.0 mg/dL   LDL Cholesterol 61 0 - 99 mg/dL   Total CHOL/HDL Ratio 2    NonHDL 76.18   Comprehensive metabolic panel  Result Value Ref Range   Sodium 138 135 - 145 mEq/L   Potassium 4.5 3.5 - 5.1 mEq/L   Chloride 107 96 - 112 mEq/L   CO2 26 19 - 32 mEq/L   Glucose, Bld 122 (H) 70 - 99 mg/dL   BUN 23 6 - 23 mg/dL   Creatinine, Ser 1.60 (H) 0.40 - 1.50 mg/dL  Total Bilirubin 0.3 0.2 - 1.2 mg/dL   Alkaline Phosphatase 64 39 - 117 U/L   AST 26 0 - 37 U/L   ALT 37 0 - 53 U/L   Total Protein 6.5 6.0 - 8.3 g/dL   Albumin 4.0 3.5 - 5.2 g/dL   Calcium 8.6 8.4 - 10.5 mg/dL   GFR 45.02 (L) >60.00 mL/min   *Note: Due to a large number of results and/or encounters for the requested time period, some results have not been displayed. A complete set of results can be found in Results Review.      Assessment & Plan:   Problem List Items Addressed This Visit    Acute bronchitis - Primary    Good O2 sats, no wheezing on exam. anticipate respiratory infection/bronchitis. Short duration but with significant comorbidities (DM, COPD, h/o PNA and PE, coumadin use). Also complicating picture is current use of augmentin for recent wound infection s/p panniculectomy. Will treat aggressively with doxy to cover atypicals. Pt agrees with plan. rec OTC cough syrup for night time. He does have close f/u with PCP.       Anticoagulant long-term use    He will call coumadin clinic for dosing while on doxycycline.       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  Ria Bush, MD

## 2016-09-14 NOTE — Patient Instructions (Addendum)
I do think you are developing bronchitis. Finish augmentin, add doxycycline 10 day course.  Push fluids and rest.  Pick up delsym or dimetapp for cough.  Touch base with coumadin clinic for dosing while on doxycycline (you will likely need to decrease coumadin dose).  Let us know if fever >101, worsening productive cough.

## 2016-09-15 NOTE — Telephone Encounter (Signed)
Last office visit 09/14/2016 with Dr. Darnell Level for bronchitis.  Last refilled 08/14/16 for #30 with 1 refill.  Ok to refill?

## 2016-09-15 NOTE — Telephone Encounter (Signed)
Left message at [facility] that Zolpidem was last refilled on 08/14/2016 for #30 with 1 refill.  [name] should have a refill available.

## 2016-09-21 ENCOUNTER — Ambulatory Visit (INDEPENDENT_AMBULATORY_CARE_PROVIDER_SITE_OTHER): Payer: Medicare Other

## 2016-09-21 ENCOUNTER — Telehealth: Payer: Self-pay | Admitting: *Deleted

## 2016-09-21 ENCOUNTER — Encounter: Payer: Self-pay | Admitting: Family Medicine

## 2016-09-21 ENCOUNTER — Ambulatory Visit (INDEPENDENT_AMBULATORY_CARE_PROVIDER_SITE_OTHER)
Admission: RE | Admit: 2016-09-21 | Discharge: 2016-09-21 | Disposition: A | Discharge status: Home / Self Care | Payer: Medicare Other | Source: Ambulatory Visit | Attending: Family Medicine | Admitting: Family Medicine

## 2016-09-21 ENCOUNTER — Ambulatory Visit (INDEPENDENT_AMBULATORY_CARE_PROVIDER_SITE_OTHER): Payer: Medicare Other | Admitting: Family Medicine

## 2016-09-21 VITALS — BP 106/62 | HR 77 | Temp 98.3°F | Wt 188.5 lb

## 2016-09-21 DIAGNOSIS — J189 Pneumonia, unspecified organism: Secondary | ICD-10-CM

## 2016-09-21 DIAGNOSIS — R05 Cough: Secondary | ICD-10-CM | POA: Diagnosis not present

## 2016-09-21 DIAGNOSIS — Z5181 Encounter for therapeutic drug level monitoring: Secondary | ICD-10-CM | POA: Diagnosis not present

## 2016-09-21 DIAGNOSIS — J181 Lobar pneumonia, unspecified organism: Secondary | ICD-10-CM | POA: Diagnosis not present

## 2016-09-21 DIAGNOSIS — Z7901 Long term (current) use of anticoagulants: Secondary | ICD-10-CM | POA: Diagnosis not present

## 2016-09-21 DIAGNOSIS — I824Y9 Acute embolism and thrombosis of unspecified deep veins of unspecified proximal lower extremity: Secondary | ICD-10-CM

## 2016-09-21 LAB — POCT INR: INR: 5

## 2016-09-21 MED ORDER — LEVOFLOXACIN 500 MG PO TABS: 500.0000 mg | ORAL_TABLET | Freq: Every day | ORAL | 0 refills | Status: DC

## 2016-09-21 NOTE — Progress Notes (Signed)
BP 106/62   Pulse 77   Temp 98.3 F (36.8 C) (Oral)   Wt 188 lb 8 oz (85.5 kg)   SpO2 92%   BMI 25.57 kg/m    CC: f/u bronchitis Subjective:    Patient ID: [name], male    DOB: 1941/02/09, 76 y.o.   MRN: [id number]  HPI: [name] is a 76 y.o. male presenting on 09/21/2016 for Follow-up (chest congestion)   See prior note for details. Seen here last week - dx with bronchitis given comorbidities treated with doxycycline course (was also finishing augmentin course for wound infection after panniculectomy). Despite this cough getting deeper, more productive of yellow mucous. At night time feels worsening rattle. Today feels more tight in chest. ++PNDrainage and phlegm. Some dyspnea.   He has been taking delsym.  No fevers/chills. No wheezing.   + childhood asthma. Carries dx mild COPD.   H/o PNA, remote h/o PE.  On chronic coumadin for afib. Followed by cardiology coumadin clinic.   Relevant past medical, surgical, family and social history reviewed and updated as indicated. Interim medical history since our last visit reviewed. Allergies and medications reviewed and updated. Outpatient Medications Prior to Visit  Medication Sig Dispense Refill  . acarbose (PRECOSE) 25 MG tablet Take 25 mg by mouth 2 (two) times daily.     [name] acetaminophen (TYLENOL) 500 MG tablet Take 1,000 mg by mouth every 6 (six) hours as needed (for pain.).    [name] atorvastatin (LIPITOR) 80 MG tablet TAKE 1 TABLET BY MOUTH AT  BEDTIME 90 tablet 0  . FLUoxetine (PROZAC) 40 MG capsule TAKE 1 CAPSULE BY MOUTH  DAILY 90 capsule 1  . furosemide (LASIX) 20 MG tablet TAKE 1 TO 2 TABLETS BY  MOUTH DAILY 180 tablet 1  . gabapentin (NEURONTIN) 300 MG capsule Take 1 capsule (300 mg total) by mouth 2 (two) times daily. 180 capsule 1  . hydrocortisone 2.5 % cream APPLY ON THE SKIN TWICE A DAILY TO GROINS, ARMS, AND SIDES FOR RASH  4  . L-Methylfolate-Algae-B12-B6 (METANX) 3-90.314-2-35 MG CAPS Take 1  capsule by mouth 2 (two) times daily.    . lansoprazole (PREVACID) 30 MG capsule TAKE 1 CAPSULE (30 MG TOTAL) BY MOUTH DAILY BEFORE BREAKFAST.  5  . lidocaine (XYLOCAINE) 5 % ointment Apply 1 application topically daily. APPLIED TO AFFECTED AREA OF FOOT  2  . lisinopril (PRINIVIL,ZESTRIL) 2.5 MG tablet Take 2.5 mg by mouth daily.     . NONFORMULARY OR COMPOUNDED ITEM Apply 1 application topically daily. APPLY TO AFFECTED AREAS DAILY (DO NOT APPLY TO FACE, GROIN, UNDERARMS, OR NECK)  2  . nystatin cream (MYCOSTATIN) Apply 1 application topically daily. APPLIED AFTER BATHING    . omeprazole (PRILOSEC) 40 MG capsule TAKE 1 CAPSULE BY MOUTH  DAILY 90 capsule 1  . primidone (MYSOLINE) 50 MG tablet Take 2 tablets (100 mg total) by mouth 2 (two) times daily. 360 tablet 1  . ranitidine (ZANTAC) 300 MG tablet Take 300 mg by mouth at bedtime.     . silodosin (RAPAFLO) 8 MG CAPS capsule Take 8 mg by mouth daily with breakfast.     . sucralfate (CARAFATE) 1 g tablet Take 1 g by mouth daily before breakfast.     . triamcinolone cream (KENALOG) 0.1 % APPLY TO AFFECTED AREA TWICE A DAY  2  . warfarin (COUMADIN) 2.5 MG tablet TAKE 1 TO 1.5 TABLETS BY  MOUTH DAILY OR AS DIRECTED  BY COUMADIN  CLINIC (Patient taking differently: TAKE 1 TABLET (2.5 MG) ON SUNDAY, [facility]. TAKE 1.5 TABLETS (3.75 MG) BY MOUTH ON MONDAY, WEDNESDAY, FRIDAY, & SATURDAY.) 135 tablet 1  . zolpidem (AMBIEN) 10 MG tablet TAKE 1 TABLET AT BEDTIME AS NEEDED FOR SLEEP (Patient taking differently: TAKE 1 TABLET (10 MG) AT BEDTIME NIGHTLY) 30 tablet 1  . amoxicillin-clavulanate (AUGMENTIN) 875-125 MG tablet Take 1 tablet by mouth 2 (two) times daily.  0  . doxycycline (VIBRA-TABS) 100 MG tablet Take 1 tablet (100 mg total) by mouth 2 (two) times daily. 20 tablet 0  . HYDROcodone-acetaminophen (NORCO/VICODIN) 5-325 MG tablet Take 1 tablet by mouth every 4 (four) hours as needed for moderate pain. 30 tablet 0   No facility-administered  medications prior to visit.      Per HPI unless specifically indicated in ROS section below Review of Systems     Objective:    BP 106/62   Pulse 77   Temp 98.3 F (36.8 C) (Oral)   Wt 188 lb 8 oz (85.5 kg)   SpO2 92%   BMI 25.57 kg/m   Wt Readings from Last 3 Encounters:  09/21/16 188 lb 8 oz (85.5 kg)  09/14/16 193 lb 12.8 oz (87.9 kg)  09/11/16 191 lb (86.6 kg)    Physical Exam  Constitutional: He appears well-developed and well-nourished.  Tired, fatigued, nontoxic  HENT:  Head: Normocephalic and atraumatic.  Right Ear: Hearing and external ear normal.  Left Ear: Hearing and external ear normal.  Nose: Nose normal. No mucosal edema or rhinorrhea. Right sinus exhibits no maxillary sinus tenderness and no frontal sinus tenderness. Left sinus exhibits no maxillary sinus tenderness and no frontal sinus tenderness.  Mouth/Throat: Uvula is midline, oropharynx is clear and moist and mucous membranes are normal. No oropharyngeal exudate, posterior oropharyngeal edema, posterior oropharyngeal erythema or tonsillar abscesses.  Hearing aides in place  Eyes: Pupils are equal, round, and reactive to light. Conjunctivae and EOM are normal. No scleral icterus.  Neck: Normal range of motion. Neck supple.  Cardiovascular: Normal rate, regular rhythm, normal heart sounds and intact distal pulses.   No murmur heard. Pulmonary/Chest: Effort normal. No respiratory distress. He has decreased breath sounds. He has no wheezes. He has rhonchi. He has rales (LLL).  Coarse throughout  Lymphadenopathy:    He has no cervical adenopathy.  Skin: Skin is warm and dry. No rash noted.  Psychiatric: He has a normal mood and affect.  Nursing note and vitals reviewed.  Lab Results  Component Value Date   INR 5.0 09/21/2016   INR 3.1 09/10/2016   INR 1.1 (H) 09/01/2016       Assessment & Plan:   Problem List Items Addressed This Visit    Acute venous embolism and thrombosis of deep vessels of  proximal lower extremity ([place])   Relevant Orders   DG Chest 2 View   CAP (community acquired pneumonia) - Primary    Acute worsening of presumed bronchitis despite recent treatment with doxycycline course (and he was already on augmentin course). New decreased o2 sat (92%). Anticipate has developed into community acquired pneumonia. Will broaden coverage with levaquin antibiotic. Pt agrees with plan.   Further supportive care as per instructions.  RTC 2d close f/u. ER precautions reviewed.       Relevant Medications   levofloxacin (LEVAQUIN) 500 MG tablet   Encounter for therapeutic drug monitoring    Hold coumadin tonight then await instructions from coumadin clinic. We are starting levaquin  course today for presumed pneumonia.        Other Visit Diagnoses    Atrial fibrillation ([place])       Long term current use of anticoagulant therapy           Follow up plan: Return in about 2 days (around 09/23/2016), or if symptoms worsen or fail to improve, for follow up visit.  Ria Bush, MD

## 2016-09-21 NOTE — Assessment & Plan Note (Signed)
Hold coumadin tonight then await instructions from coumadin clinic. We are starting levaquin course today for presumed pneumonia.

## 2016-09-21 NOTE — Telephone Encounter (Signed)
Will see then and check INR

## 2016-09-21 NOTE — Telephone Encounter (Signed)
Pt came in for INR and OV, I routed INR results to [facility]. Pt states that she told him she would call him this pm with instructions after we routed her results.

## 2016-09-21 NOTE — Telephone Encounter (Signed)
Doroteo Bradford nurse with [facility] left a voicemail stating that patient has an appointment scheduled with you today because he is not any better. Doroteo Bradford stated that patient has been on Doxycycline that can affect his INR. Doroteo Bradford requested that his PT/INR be checked when he at the office for the sick visit today and forwarded to her. Doroteo Bradford stated that she called the patient to come there for coumadin check, but patient stated that he did not feel like it.

## 2016-09-21 NOTE — Patient Instructions (Addendum)
I do worry you have developed pneumonia despite antibiotics.  Start levaquin antibiotic sent to pharmacy for next 7 days.  Return in 2 days for recheck. If any worsening shortness of breath, any fever >101, or worsening productive cough, seek urgent care.  Coumadin was very high - hold today's dose and await instructions from [place].

## 2016-09-21 NOTE — Assessment & Plan Note (Addendum)
Acute worsening of presumed bronchitis despite recent treatment with doxycycline course (and he was already on augmentin course). New decreased o2 sat (92%). Anticipate has developed into community acquired pneumonia. Will broaden coverage with levaquin antibiotic. Pt agrees with plan.   Further supportive care as per instructions.  RTC 2d close f/u. ER precautions reviewed.

## 2016-09-23 ENCOUNTER — Encounter: Payer: Self-pay | Admitting: Family Medicine

## 2016-09-23 ENCOUNTER — Ambulatory Visit (INDEPENDENT_AMBULATORY_CARE_PROVIDER_SITE_OTHER): Payer: Medicare Other | Admitting: Family Medicine

## 2016-09-23 VITALS — BP 102/60 | HR 78 | Temp 97.5°F | Wt 189.5 lb

## 2016-09-23 DIAGNOSIS — J189 Pneumonia, unspecified organism: Secondary | ICD-10-CM | POA: Diagnosis not present

## 2016-09-23 MED ORDER — ALBUTEROL SULFATE (2.5 MG/3ML) 0.083% IN NEBU: 2.5000 mg | INHALATION_SOLUTION | Freq: Four times a day (QID) | RESPIRATORY_TRACT | 1 refills | Status: DC | PRN

## 2016-09-23 NOTE — Assessment & Plan Note (Addendum)
CAP vs bacterial bronchitis. CXR was clear. He is improving on levaquin. rec finish 7d course. Will add albuterol neb PRN dyspnea/wheeze at home. Pt agrees with plan.  Keep close f/u next week with PCP.  Keep tomorrow's appt with coumadin clinic.

## 2016-09-23 NOTE — Progress Notes (Signed)
BP 102/60   Pulse 78   Temp (!) 97.5 F (36.4 C) (Oral)   Wt 189 lb 8 oz (86 kg)   SpO2 97%   BMI 25.70 kg/m    CC: 2d f/u Subjective:    Patient ID: [name], male    DOB: December 10, 1940, 76 y.o.   MRN: [id number]  HPI: [name] is a 76 y.o. male presenting on 09/23/2016 for Follow-up   See prior note for details. Seen here 2d ago with concern for CAP although CXR was clear.  He has started feeling better since levaquin was started. Has taken 3 doses.  He has nebulizer machine but doesn't have any albuterol.   Ongoing deep cough, productive mucous changing from yellow to white. Ongoing wheezing.  No fevers/chills.   Has appt tomorrow with coumadin clinic and appt next week with PCP.   Relevant past medical, surgical, family and social history reviewed and updated as indicated. Interim medical history since our last visit reviewed. Allergies and medications reviewed and updated. Outpatient Medications Prior to Visit  Medication Sig Dispense Refill  . acarbose (PRECOSE) 25 MG tablet Take 25 mg by mouth 2 (two) times daily.     [name] acetaminophen (TYLENOL) 500 MG tablet Take 1,000 mg by mouth every 6 (six) hours as needed (for pain.).    [name] atorvastatin (LIPITOR) 80 MG tablet TAKE 1 TABLET BY MOUTH AT  BEDTIME 90 tablet 0  . FLUoxetine (PROZAC) 40 MG capsule TAKE 1 CAPSULE BY MOUTH  DAILY 90 capsule 1  . furosemide (LASIX) 20 MG tablet TAKE 1 TO 2 TABLETS BY  MOUTH DAILY 180 tablet 1  . gabapentin (NEURONTIN) 300 MG capsule Take 1 capsule (300 mg total) by mouth 2 (two) times daily. 180 capsule 1  . hydrocortisone 2.5 % cream APPLY ON THE SKIN TWICE A DAILY TO GROINS, ARMS, AND SIDES FOR RASH  4  . L-Methylfolate-Algae-B12-B6 (METANX) 3-90.314-2-35 MG CAPS Take 1 capsule by mouth 2 (two) times daily.    . lansoprazole (PREVACID) 30 MG capsule TAKE 1 CAPSULE (30 MG TOTAL) BY MOUTH DAILY BEFORE BREAKFAST.  5  . levofloxacin (LEVAQUIN) 500 MG tablet Take 1 tablet (500  mg total) by mouth daily. 7 tablet 0  . lidocaine (XYLOCAINE) 5 % ointment Apply 1 application topically daily. APPLIED TO AFFECTED AREA OF FOOT  2  . lisinopril (PRINIVIL,ZESTRIL) 2.5 MG tablet Take 2.5 mg by mouth daily.     . NONFORMULARY OR COMPOUNDED ITEM Apply 1 application topically daily. APPLY TO AFFECTED AREAS DAILY (DO NOT APPLY TO FACE, GROIN, UNDERARMS, OR NECK)  2  . nystatin cream (MYCOSTATIN) Apply 1 application topically daily. APPLIED AFTER BATHING    . omeprazole (PRILOSEC) 40 MG capsule TAKE 1 CAPSULE BY MOUTH  DAILY 90 capsule 1  . primidone (MYSOLINE) 50 MG tablet Take 2 tablets (100 mg total) by mouth 2 (two) times daily. 360 tablet 1  . ranitidine (ZANTAC) 300 MG tablet Take 300 mg by mouth at bedtime.     . silodosin (RAPAFLO) 8 MG CAPS capsule Take 8 mg by mouth daily with breakfast.     . sucralfate (CARAFATE) 1 g tablet Take 1 g by mouth daily before breakfast.     . triamcinolone cream (KENALOG) 0.1 % APPLY TO AFFECTED AREA TWICE A DAY  2  . warfarin (COUMADIN) 2.5 MG tablet TAKE 1 TO 1.5 TABLETS BY  MOUTH DAILY OR AS DIRECTED  BY COUMADIN CLINIC (Patient taking differently: TAKE  1 TABLET (2.5 MG) ON SUNDAY, [place]. TAKE 1.5 TABLETS (3.75 MG) BY MOUTH ON MONDAY, WEDNESDAY, FRIDAY, & SATURDAY.) 135 tablet 1  . zolpidem (AMBIEN) 10 MG tablet TAKE 1 TABLET AT BEDTIME AS NEEDED FOR SLEEP (Patient taking differently: TAKE 1 TABLET (10 MG) AT BEDTIME NIGHTLY) 30 tablet 1   No facility-administered medications prior to visit.      Per HPI unless specifically indicated in ROS section below Review of Systems     Objective:    BP 102/60   Pulse 78   Temp (!) 97.5 F (36.4 C) (Oral)   Wt 189 lb 8 oz (86 kg)   SpO2 97%   BMI 25.70 kg/m   Wt Readings from Last 3 Encounters:  09/23/16 189 lb 8 oz (86 kg)  09/21/16 188 lb 8 oz (85.5 kg)  09/14/16 193 lb 12.8 oz (87.9 kg)    Physical Exam  Constitutional: He appears well-developed and well-nourished. No  distress.  HENT:  Mouth/Throat: Oropharynx is clear and moist. No oropharyngeal exudate.  Cardiovascular: Normal rate, regular rhythm, normal heart sounds and intact distal pulses.   No murmur heard. Pulmonary/Chest: Effort normal. No respiratory distress. He has no decreased breath sounds. He has wheezes (faint exp). He has no rhonchi. He has no rales.  Good air movement throughout  Musculoskeletal: He exhibits no edema.  Nursing note and vitals reviewed.      Assessment & Plan:   Problem List Items Addressed This Visit    CAP (community acquired pneumonia) - Primary    CAP vs bacterial bronchitis. CXR was clear. He is improving on levaquin. rec finish 7d course. Will add albuterol neb PRN dyspnea/wheeze at home. Pt agrees with plan.  Keep close f/u next week with PCP.  Keep tomorrow's appt with coumadin clinic.       Relevant Medications   albuterol (PROVENTIL) (2.5 MG/3ML) 0.083% nebulizer solution       Follow up plan: Return if symptoms worsen or fail to improve.  Ria Bush, MD

## 2016-09-23 NOTE — Patient Instructions (Addendum)
I'm glad you're feeling better!  Continue levaquin.  Add on albuterol nebulization as needed for shortness of breath or wheezing.  Keep appointment with Dr Diona Browner next week.

## 2016-09-25 ENCOUNTER — Ambulatory Visit (INDEPENDENT_AMBULATORY_CARE_PROVIDER_SITE_OTHER): Payer: Medicare Other

## 2016-09-25 DIAGNOSIS — Z7901 Long term (current) use of anticoagulants: Secondary | ICD-10-CM

## 2016-09-25 DIAGNOSIS — I824Y9 Acute embolism and thrombosis of unspecified deep veins of unspecified proximal lower extremity: Secondary | ICD-10-CM | POA: Diagnosis not present

## 2016-09-25 DIAGNOSIS — I4891 Unspecified atrial fibrillation: Secondary | ICD-10-CM

## 2016-09-25 DIAGNOSIS — Z5181 Encounter for therapeutic drug level monitoring: Secondary | ICD-10-CM | POA: Diagnosis not present

## 2016-09-25 LAB — POCT INR: INR: 2.5

## 2016-09-28 ENCOUNTER — Telehealth: Payer: Self-pay | Admitting: Neurology

## 2016-09-28 ENCOUNTER — Encounter: Payer: Self-pay | Admitting: Neurology

## 2016-09-28 DIAGNOSIS — S83241A Other tear of medial meniscus, current injury, right knee, initial encounter: Secondary | ICD-10-CM | POA: Diagnosis not present

## 2016-09-28 NOTE — Telephone Encounter (Signed)
Left message on machine for patient to call back.

## 2016-09-28 NOTE — Telephone Encounter (Signed)
PCP notes indicate that he is getting over (hopefully) CAP so this could be toxic headache and would hold on anything until this is completely resolved.  Drink plenty water

## 2016-09-28 NOTE — Telephone Encounter (Signed)
This encounter was created in error - please disregard.

## 2016-09-28 NOTE — Telephone Encounter (Signed)
Spoke with patient. He states he has ongoing issues with daily headaches as stated in his last office note. He does want to proceed with Botox and I will start insurance work on Botox.   The last 6 days he has had worsening headaches. He has taking tylenol daily 3-4 x a day for the last week and no relief. He is still on Gabapentin 100 mg TID.  Please advise on current headache cycle.

## 2016-09-28 NOTE — Telephone Encounter (Signed)
Spoke with patient and he was made aware.

## 2016-09-28 NOTE — Telephone Encounter (Signed)
PT called and wants to see Dr Tat sooner than his October appointment because he said he is having really bad headaches, please advise

## 2016-09-29 ENCOUNTER — Ambulatory Visit (INDEPENDENT_AMBULATORY_CARE_PROVIDER_SITE_OTHER): Payer: Medicare Other | Admitting: Family Medicine

## 2016-09-29 ENCOUNTER — Encounter: Payer: Self-pay | Admitting: Family Medicine

## 2016-09-29 VITALS — BP 109/61 | HR 84 | Temp 97.6°F | Ht 72.0 in | Wt 196.0 lb

## 2016-09-29 DIAGNOSIS — J189 Pneumonia, unspecified organism: Secondary | ICD-10-CM

## 2016-09-29 DIAGNOSIS — J449 Chronic obstructive pulmonary disease, unspecified: Secondary | ICD-10-CM | POA: Diagnosis not present

## 2016-09-29 DIAGNOSIS — J4531 Mild persistent asthma with (acute) exacerbation: Secondary | ICD-10-CM

## 2016-09-29 MED ORDER — MOMETASONE FUROATE 220 MCG/INH IN AEPB
2.0000 | INHALATION_SPRAY | Freq: Every day | RESPIRATORY_TRACT | 0 refills | Status: DC
Start: 2016-09-29 — End: 2016-10-16

## 2016-09-29 NOTE — Assessment & Plan Note (Signed)
Pt with current reactive airways... Refused steroid taper or injection given weight gain concern despite reassurance. Will start inhaled steroid.

## 2016-09-29 NOTE — Patient Instructions (Signed)
Start mometasone inhaler 2 puffs  Daily.  No further antibiotics needed.  Can also use albuterol nebulizer every 6 hours as needed for wheeze.. If you want to try a medicaiton instead of albuterol that does not make you shake as much.. Call insurance to see if they cover xopenex nebs.

## 2016-09-29 NOTE — Assessment & Plan Note (Signed)
No current sign of continue bacterial infection. No indication for additional antibiotics at this time.

## 2016-09-29 NOTE — Progress Notes (Signed)
   Subjective:    Patient ID: [name], male    DOB: [birth year], 76 y.o.   MRN: [id number]  HPI  76 year old male pt presents for no improvement in cough.   Currently being treated for community aquired pneumonia. 8/6 Dx with bronchitis by Dr. Darnell Level:  Treated with doxycycline, cough syrup.   Also recently on augmentin for infection relating to panniculectomy recently.  Returned 8/13: no better per Dr. Darnell Level.. Dx changed to  CAP. Treated with  X 7 days CXR:  IMPRESSION: Chronic interstitial disease and mild basilar subsegmental atelectasis and/or scarring.  Followed up on 8/15:  Was improving  at that time   Today: Pt return stating he is still having cough, dry at this point.  No fever.  Intermittent wheeze, tightness in chest, shortness of breat  no fatigue, no ear pain, no face pain  Using albuterol off and on but make him jittery.  Blood pressure 109/61, pulse 84, temperature 97.6 F (36.4 C), temperature source Oral, height 6' (1.829 m), weight 196 lb (88.9 kg), SpO2 99 %.   Review of Systems  Constitutional: Negative for fatigue and fever.  HENT: Negative for ear pain.   Eyes: Negative for pain.  Respiratory: Positive for cough, shortness of breath and wheezing.   Cardiovascular: Negative for chest pain, palpitations and leg swelling.  Gastrointestinal: Negative for abdominal pain.  Genitourinary: Negative for dysuria.  Musculoskeletal: Negative for arthralgias.  Neurological: Negative for syncope, light-headedness and headaches.  Psychiatric/Behavioral: Negative for dysphoric mood.       Objective:   Physical Exam  Constitutional: Vital signs are normal. He appears well-developed and well-nourished.  HENT:  Head: Normocephalic.  Right Ear: Hearing normal.  Left Ear: Hearing normal.  Nose: Nose normal.  Mouth/Throat: Oropharynx is clear and moist and mucous membranes are normal.  Neck: Trachea normal. Carotid bruit is not present. No thyroid mass and no  thyromegaly present.  Cardiovascular: Normal rate, regular rhythm and normal pulses.  Exam reveals no gallop, no distant heart sounds and no friction rub.   No murmur heard. No peripheral edema  Pulmonary/Chest: Effort normal. No respiratory distress. He has wheezes.  Skin: Skin is warm, dry and intact. No rash noted.  Psychiatric: He has a normal mood and affect. His speech is normal and behavior is normal. Thought content normal.          Assessment & Plan:

## 2016-10-01 ENCOUNTER — Ambulatory Visit: Payer: Medicare Other | Admitting: Family Medicine

## 2016-10-02 DIAGNOSIS — M50322 Other cervical disc degeneration at C5-C6 level: Secondary | ICD-10-CM | POA: Diagnosis not present

## 2016-10-02 DIAGNOSIS — M9901 Segmental and somatic dysfunction of cervical region: Secondary | ICD-10-CM | POA: Diagnosis not present

## 2016-10-07 ENCOUNTER — Telehealth: Payer: Self-pay | Admitting: Neurology

## 2016-10-07 NOTE — Telephone Encounter (Signed)
Left message on machine for patient to call back.  TO let him know Botox approved for Migraines and to schedule next available Botox appt.  Awaiting call back.

## 2016-10-08 NOTE — Telephone Encounter (Signed)
Appt made with patient.  

## 2016-10-15 ENCOUNTER — Other Ambulatory Visit: Payer: Self-pay | Admitting: Family Medicine

## 2016-10-16 ENCOUNTER — Ambulatory Visit (INDEPENDENT_AMBULATORY_CARE_PROVIDER_SITE_OTHER): Payer: Medicare Other | Admitting: Neurology

## 2016-10-16 DIAGNOSIS — G43709 Chronic migraine without aura, not intractable, without status migrainosus: Secondary | ICD-10-CM

## 2016-10-16 DIAGNOSIS — E1142 Type 2 diabetes mellitus with diabetic polyneuropathy: Secondary | ICD-10-CM

## 2016-10-16 DIAGNOSIS — IMO0002 Reserved for concepts with insufficient information to code with codable children: Secondary | ICD-10-CM

## 2016-10-16 MED ORDER — ONABOTULINUMTOXINA 100 UNITS IJ SOLR
60.0000 [IU] | Freq: Once | INTRAMUSCULAR | Status: AC
  Administered 2016-10-16: 60 [IU] via INTRAMUSCULAR

## 2016-10-16 NOTE — Progress Notes (Signed)
Subjective:    [name] was seen in consultation in the movement disorder clinic at the request of Dr. Joya Salm.  His PCP is Bedsole, Amy E, MD.  This patient is accompanied in the office by his child who supplements the history. The evaluation is for tremor.  I reviewed prior records that are available to me.  Looking back in his medical record, there are notes back to 2012 that indicate severe essential tremor.  Pt reports tremor has been worse since recent surgeries.  He had a shoulder surgery for bone spurs first and then had a shoulder replacement on the L and then had a cervical fusion surgery.  Prior to that, he had gastric bypass in 2013.    The patient is a 76 y.o. right handed male with a history of tremor.  Pt reports that tremor started after an episode of sepsis about 5 years ago.  Once he left the hospital, it was in the bilateral UE, overall slight but it has gotten worse over 8 months.  He cannot eat peas or the roll off the spoon.   There is no family hx of tremor.    Affected by caffeine:  No. Affected by alcohol: doesn't drink Affected by stress:  No. Affected by fatigue:  No. Spills soup if on spoon:  Yes.   Spills glass of liquid if full:  No. Affects ADL's (tying shoes, brushing teeth, etc):  No.  Pt had neck hematomas after surgery, which caused dysphagia.  A modified barium swallow was done on 08/14/2013 indicate severe cervical face dysphagia, moderate to severe pharyngeal phase dysphagia.  There is recommended if crush his medications or takes them in liquid form.  Outpatient speech language pathology and nutrition consult was also recommended.  11/01/13 update:  Pt is returning for f/u.  Started primidone last visit.   Pt states that it has been helpful but thinks that we could go up on the medication.  No SE.  Had carpal tunnel release on the L.  States that it didn't help.  Has also gone to speech therapy for swallowing, as recommended and that did not  help.  01/02/14 update:  The patient returns for follow-up today.  He has a history of essential tremor.  Last visit, his primidone was increased 50 mg twice a day.  "I think that if you increased it one more time, we will have it just right."  I got a note from his ENT physician at [facility].  They agreed that dysphagia was related to prior cervical spine surgery.  They told him that loss of balance is not due to an inner ear pathology and he was told to follow-up here.  He does have a history of diabetic peripheral neuropathy.  Pt states that balance has been off since the neck surgery and thinks that it is related.  He states that it actually has gotten slowly better.  Balance isn't great but he isn't falling anymore either which used to be a big problem.  05/02/14 update:  His primidone was increased last visit so that he is taking 100 mg in the morning and he remains on 50 mg at night.  The patient states that tremor has tremor is doing well.  He states that tremor is well controlled.  He only spills something or notices tremor 1-2 days a month, which is markedly improved.  No SE with the tremor.    11/02/14 update:  The patient has a history of essential  tremor and is on primidone, 100 mg in the morning and 50 mg at night. He thinks that tremor is well controlled.   I reviewed records since our last visit.  He has undergone a rather extensive workup for epigastric pain and weight loss.  He is a CT of the abdomen that was negative.  His H. pylori antibodies were negative.  He had a stress Myoview and while there was some evidence of ischemia, the patient did not believe that this was cardiac related and did not want to proceed with a cath.  He has seen gastroenterology and has had an EGD.  He was started on carafate and he thinks that it is helping.  He thinks that much of it is from the gastric bypass as he has emesis after the meals.    11/27/14 update:  Pt is f/u much earlier than expected.  On  primidone, 100 mg in AM, 50 mg at night.  The records that were made available to me were reviewed since last visit.  Had to have penile prosthesis removed that malfunctioned and insert a new one. Also, had surgery for his tear ducts yesterday. His wife states that Sunday AM he was getting out of the shower and he screamed and he felt like he was getting stabbed in the back of the head.  He's had this in the past but not this severe.  It is sore to the touch behind the ear.  It was awful for 15 min (felt like on fire) but if he turns his head to the right, he still feels it.    03/22/15 update:  The patient follows up today regarding his essential tremor.  He is on primidone, 100 mg in the morning and 50 g at night.  Tremor has slightly increased with time.   Last visit, which was in October, we did an occipital nerve block for right occipital neuralgia.  This was done on 11/28/2014.  He states that it did not help and he has been having a daily headache.  He continues to have a posterior occipital headache that radiates to the frontal region but he points to the left side today.  He states that the fingers on the L thumb, pointer and middle finger are numb.  States had Lcarpal tunnel surgery a year ago but these sx's started after shoulder replacement surgery and is convinced that something happened; no neck pain but has had prior neck surgery.  The shoulder replacement surgery was done first and the CTS was done a month later.     I did review records since our last visit.  He underwent surgery for an internal hernia repair on 03/08/2015.  09/30/15 update:  The patient follows up today.  He has a history of essential tremor and last visit I slightly increased his primidone to that he is taking 100 mg both in the morning and at night.  He states that he still has tremor but that is fairly stable and not worse.  He was complaining about left-sided hand paresthesias last visit and we ended up doing an EMG on file  14th, 2017.  This was normal, without evidence of focal neuropathy or radiculopathy.  C/o pain in back of the head that will feel like he is getting hit in the head on either and then will go away within a few seconds.  Has had a few times in the last few months but always keeps a "low grade headache."   No  hx of stomach ulcer.  No diabetic meds x many months.  He is unsure why he takes the gabapentin but thinks it may be for diabetic neuropathy and gouty pain.    05/19/16 update:  Patient seen today in follow-up.  He is on primidone, 100 mg twice a day.   He had CTS by Dr. Amedeo Plenty on the L 4 weeks ago and on the R 6 weeks ago.  In regards to headache, the patient reports that they have been about the same.  He has 30 headache days per month.  They are frontal and occipital.  They can be moderate to severe but they are not that way all the time.  Remains on gabapentin, 300 mg twice per day.  Asks for refill of that.    10/16/16 update:  Pt seen today.  Was here with daughter who supplements the history.  Is here for botox for migraine but brings up other issues as well.  Daughter reveals patient taking tylenol, many daily, and states been going on since 1970's!  Also she states pt very off balance.  Recently fell off ladder.  Did have syncopal episode in recent past but because BS changes.  His BS are very out of control and has appt with endocrinology.  Current/Previously tried tremor medications: no  Current medications that may exacerbate tremor:  n/a  Outside reports reviewed: historical medical records, lab reports and referral letter/letters.  Allergies  Allergen Reactions  . Sulfacetamide Sodium Swelling    throat swelling  . Adhesive [Tape] Other (See Comments)    Tears skin-- "No tape of any kind, they all tear skin right off" Tears skin-- "No tape of any kind, they all tear skin right off"  . Latex Itching    Current Outpatient Prescriptions on File Prior to Visit  Medication Sig  Dispense Refill  . acarbose (PRECOSE) 25 MG tablet Take 25 mg by mouth 2 (two) times daily.     [name] acetaminophen (TYLENOL) 500 MG tablet Take 1,000 mg by mouth every 6 (six) hours as needed (for pain.).    [name] atorvastatin (LIPITOR) 80 MG tablet TAKE 1 TABLET BY MOUTH AT  BEDTIME 90 tablet 0  . FLUoxetine (PROZAC) 40 MG capsule TAKE 1 CAPSULE BY MOUTH  DAILY 90 capsule 1  . furosemide (LASIX) 20 MG tablet TAKE 1 TO 2 TABLETS BY  MOUTH DAILY 180 tablet 1  . gabapentin (NEURONTIN) 300 MG capsule Take 1 capsule (300 mg total) by mouth 2 (two) times daily. 180 capsule 1  . hydrocortisone 2.5 % cream APPLY ON THE SKIN TWICE A DAILY TO GROINS, ARMS, AND SIDES FOR RASH  4  . lansoprazole (PREVACID) 30 MG capsule TAKE 1 CAPSULE (30 MG TOTAL) BY MOUTH DAILY BEFORE BREAKFAST.  5  . levofloxacin (LEVAQUIN) 500 MG tablet Take 1 tablet (500 mg total) by mouth daily. 7 tablet 0  . lidocaine (XYLOCAINE) 5 % ointment Apply 1 application topically daily. APPLIED TO AFFECTED AREA OF FOOT  2  . lisinopril (PRINIVIL,ZESTRIL) 2.5 MG tablet Take 2.5 mg by mouth daily.     . NONFORMULARY OR COMPOUNDED ITEM Apply 1 application topically daily. APPLY TO AFFECTED AREAS DAILY (DO NOT APPLY TO FACE, GROIN, UNDERARMS, OR NECK)  2  . nystatin cream (MYCOSTATIN) Apply 1 application topically daily. APPLIED AFTER BATHING    . omeprazole (PRILOSEC) 40 MG capsule TAKE 1 CAPSULE BY MOUTH  DAILY 90 capsule 1  . primidone (MYSOLINE) 50 MG tablet Take 2 tablets (  100 mg total) by mouth 2 (two) times daily. 360 tablet 1  . ranitidine (ZANTAC) 300 MG tablet Take 300 mg by mouth at bedtime.     . silodosin (RAPAFLO) 8 MG CAPS capsule Take 8 mg by mouth daily with breakfast.     . sucralfate (CARAFATE) 1 g tablet Take 1 g by mouth daily before breakfast.     . triamcinolone cream (KENALOG) 0.1 % APPLY TO AFFECTED AREA TWICE A DAY  2  . warfarin (COUMADIN) 2.5 MG tablet TAKE 1 TO 1.5 TABLETS BY  MOUTH DAILY OR AS DIRECTED  BY COUMADIN CLINIC  (Patient taking differently: TAKE 1 TABLET (2.5 MG) ON SUNDAY, TUESDAY & THURSDAYS. TAKE 1.5 TABLETS (3.75 MG) BY MOUTH ON MONDAY, WEDNESDAY, FRIDAY, & SATURDAY.) 135 tablet 1  . zolpidem (AMBIEN) 10 MG tablet TAKE 1 TABLET BY MOUTH AT BEDTIME AS NEEDED FOR SLEEP 30 tablet 0   No current facility-administered medications on file prior to visit.     Past Medical History:  Diagnosis Date  . Anxiety state, unspecified   . Arthritis    hands   . Bruises easily    pt is on Coumadin  . Cancer ([place])    SKIN CANCER REMOVED  . Chronic atrial fibrillation (HCC)    a.fib on warfarin- Dr. Percival Spanish follows  . Depressive disorder, not elsewhere classified    takes Prozac daily  . Diverticulosis of colon (without mention of hemorrhage)   . ED (erectile dysfunction)   . GERD (gastroesophageal reflux disease)   . Hard of hearing    wears hearing aids  . History of blood clots    behind right knee and then went into left lung 69yrs ago  . History of colon polyps   . History of kidney stones   . Hx of diabetes mellitus    "no longer diabetic since gastric bypass" - no longer taking metformin"  in 2 months "problems with low blood sugar now"  . Hx of gout    but doesn't take any meds  . Inflammation of colonic mucosa    recent admission and release from [facility] 02-28-15-remains on oral antibiotic  . Joint pain   . Peripheral edema    takes Furosemide daily  . Peripheral vascular disease ([place])   . Pneumonia    last time about 40yrs ago  . Pulmonary embolism ([place])    over 10 yrs ago "many years ago"  . Restless legs syndrome (RLS)   . Unspecified asthma(493.90)    as a child  . Unspecified essential hypertension    hx of no longer on medication due to gastric bypass     Past Surgical History:  Procedure Laterality Date  . ANTERIOR CERVICAL DECOMP/DISCECTOMY FUSION N/A 05/09/2013   Procedure: ANTERIOR CERVICAL DECOMPRESSION/DISCECTOMY FUSION CERVICAL THREE-FOUR,CERVICAL FOUR-FIVE,CERVICAL  FIVE-SIX;  Surgeon: Floyce Stakes, MD;  Location: [facility];  Service: Neurosurgery;  Laterality: N/A;  . ARTERY REPAIR     Left forearm  . BACK SURGERY     x 3  . BREATH TEK H PYLORI  01/08/2011   Procedure: BREATH TEK H PYLORI;  Surgeon: Pedro Earls, MD;  Location: [facility];  Service: General;  Laterality: N/A;  . CARDIOVERSION     x 2 attempts-unsuccessful.  [name] CATARACT EXTRACTION, BILATERAL    . COLONOSCOPY    . CYSTOSCOPY    . ESOPHAGOGASTRODUODENOSCOPY (EGD) WITH PROPOFOL N/A 09/21/2014   Procedure: ESOPHAGOGASTRODUODENOSCOPY (EGD) WITH PROPOFOL;  Surgeon: Manya Silvas, MD;  Location: [facility];  Service: Endoscopy;  Laterality: N/A;  . EYE SURGERY     cataract bil  . FOOT SURGERY     Left foot   . GASTRIC ROUX-EN-Y  08/11/2011   Procedure: LAPAROSCOPIC ROUX-EN-Y GASTRIC BYPASS WITH UPPER ENDOSCOPY;  Surgeon: Pedro Earls, MD;  Location: [facility];  Service: General;  Laterality: N/A;  . HAND SURGERY     LEFT  . injections in back     x 18  . KNEE SURGERY Right    x 4  . KNEE SURGERY Left    arthroscopy  . LAPAROSCOPIC INTERNAL HERNIA REPAIR N/A 03/08/2015   Procedure: LAPAROSCOPIC INTERNAL HERNIA REPAIR ;  Surgeon: [name], MD;  Location: [facility];  Service: General;  Laterality: N/A;  . LEG SURGERY     FOR NECROTIZING FASCITIS L LEG AND GROIN  . PANNICULECTOMY N/A 08/25/2016   Procedure: PANNICULECTOMY;  Surgeon: [name], MD;  Location: [facility];  Service: General;  Laterality: N/A;  . PENILE PROSTHESIS IMPLANT N/A 07/02/2014   Procedure: PENILE PROTHESIS INFLATABLE 3 PIECE (COLOPLAST) SCROTAL APPROACH;  Surgeon: Kathie Rhodes, MD;  Location: [facility];  Service: Urology;  Laterality: N/A;  . PENILE PROSTHESIS IMPLANT N/A 11/23/2014   Procedure: EXPLORATION AND REVISION OF PENILE PROSTHESIS;  Surgeon: Kathie Rhodes, MD;  Location: [facility];  Service: Urology;  Laterality: N/A;  . REPLACEMENT TOTAL KNEE Left    x 2  . SHOULDER ARTHROSCOPY    .  SHOULDER SURGERY Left 2014  . TONSILLECTOMY    . TOTAL SHOULDER ARTHROPLASTY Left 01/26/2013   Procedure: TOTAL SHOULDER ARTHROPLASTY;  Surgeon: Nita Sells, MD;  Location: [facility];  Service: Orthopedics;  Laterality: Left;  Left total shoulder arthroplasty    Social History   Social History  . Marital status: Married    Spouse name: N/A  . Number of children: 1  . Years of education: N/A   Occupational History  . Retired     Administrator   Social History Main Topics  . Smoking status: Never Smoker  . Smokeless tobacco: Never Used  . Alcohol use No  . Drug use: No  . Sexual activity: Yes   Other Topics Concern  . Not on file   Social History Narrative   DIET: 3 meals, F&V, some water, crystal light, No fast food   Exercise: walks treadmill 30 min      1 Caffeine drinks daily       No living will.           Family Status  Relation Status  . Mother Deceased       breast/uterine cancer  . Father Deceased       emphysema, heart disease  . Sister Deceased       alzheimer's disease  . Brother Deceased       struck by lightening  . Brother Deceased       colon cancer  . Brother Deceased       heart disease  . Brother Alive       prostate cancer  . Daughter Alive       healthy  . Sister (Not Specified)  . Brother (Not Specified)  . Brother (Not Specified)  . Brother (Not Specified)    Review of Systems   A complete 10 system ROS was obtained and was negative apart from what is mentioned.   Objective:   VITALS:   There were no vitals filed for this visit.  Wt Readings from Last 3 Encounters:  09/29/16 196 lb (88.9 kg)  09/23/16 189 lb 8 oz (86 kg)  09/21/16 188 lb 8 oz (85.5 kg)     Gen:  Appears stated age and in NAD. HEENT:  Normocephalic.  Small abrasion over left eye where ladder hit him when he fell. The mucous membranes are moist. The superficial temporal arteries are without ropiness or tenderness. CV:  RRR Lungs: CTAB Neck:  No  bruits. There is significant tenderness over the right occipital notch   NEUROLOGICAL:  Orientation:  The patient is alert and oriented x 3 Cranial nerves: There is good facial symmetry, but there is chronic L ptosis. Extraocular muscles are intact and visual fields are full to confrontational testing. Speech is fluent and clear. Soft palate rises symmetrically and there is no tongue deviation. Hearing is intact to conversational tone. Tone: Tone is good throughout.  Mild gegenhalten. Sensation: Sensation is intact to light touch throughout.  Hands are cold bilaterally. Coordination:  The patient has no dysdiadichokinesia or dysmetria. Motor: Strength is 5/5 in the bilateral upper and lower extremities.  Shoulder shrug is equal bilaterally.  There is no pronator drift.   Gait and Station: The patient ambulates well today  MOVEMENT EXAM: Tremor:  There is no tremor noted today.  LABS    Chemistry      Component Value Date/Time   NA 138 09/11/2016 1135   K 4.5 09/11/2016 1135   CL 107 09/11/2016 1135   CO2 26 09/11/2016 1135   BUN 23 09/11/2016 1135   CREATININE 1.60 (H) 09/11/2016 1135      Component Value Date/Time   CALCIUM 8.6 09/11/2016 1135   ALKPHOS 64 09/11/2016 1135   AST 26 09/11/2016 1135   ALT 37 09/11/2016 1135   BILITOT 0.3 09/11/2016 1135          Assessment/Plan:   1.  Essential Tremor.  -improved on primidone.  Continue with two in the AM (100 mg) and 100mg  at night.  Risks, benefits, side effects and alternative therapies were discussed.  The opportunity to ask questions was given and they were answered to the best of my ability.  The patient expressed understanding and willingness to follow the outlined treatment protocols.  Refills provided today 2.  Gait instability  -It is believed that ET alone can cause ataxia/balance changes due to changes in the cerebellar purkinje cell/climbing fiber synaptic transmission.   -Based on exam, likely has PN that  plays a role as well.  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  This is getting worse and patient reports blood sugars have been out of control lately and has an upcoming appt with endocrinology.  Asked him to make a f/u appt here as well and will discuss in more detail.  Safety discussed 3.  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  Pt wanted lid lift but ophthalmology didn't want to proceed  5.  Headache  -no topamax due to hx of nephrolithiasis.   -gabapentin not providing enough relief.  Still having headache.  Wants a refill and provided.  300 mg bid.  -started botox today for chronic migraine  -talked to him about d/c tylenol.  Long discussion about rebound headache with patient and daughter and tylenol safety. 6.  Follow up in october

## 2016-10-16 NOTE — Procedures (Signed)
[facility]   History:  Diagnosis: Migraine, chronic  Initial side: bilateral   Result History  Onset of effect: n/a  Duration of Benefit: n/a  Adverse Effects: n/a   Consent obtained from: The patient Benefits discussed included, but were not limited to decreased muscle tightness, increased joint range of motion, and decreased pain.  Risk discussed included, but were not limited pain and discomfort, bleeding, bruising, excessive weakness, venous thrombosis, muscle atrophy and dysphagia.  A copy of the patient medication guide was given to the patient which explains the blackbox warning.  Patients identity and treatment sites confirmed Yes.  .  Details of Procedure: Skin was cleaned with alcohol.  A 30 gauge, 1/2 inch needle was used.  Prior to injection, the needle plunger was aspirated to make sure the needle was not within a blood vessel.  There was no blood retrieved on aspiration.    Following is a summary of the muscles injected  And the amount of Botulinum toxin used:  Injections  Location Left  Right Units Number of sites        Corrugator 2.5/2.5 2.5/2.5 10 2  each  Frontalis 2.5/2.5 2.5/2.5 10 2  each  Temporalis 2.5/2.5 2.5/2.5 10.0 2 each  Cervical paraspinal, posterior 5.0/2.5 5.0/2.5 15.0 2 each  occiput 2.5/2.5 2.5/2.5 10.0 2 each  Procerus   5.0 1  TOTAL UNITS:   60     Agent: Botulinum Type A ( Onobotulinum Toxin type A ).  1 vials of Botox were used, each containing 50 units and freshly diluted with 2 mL of sterile, non-perserved saline   Total injected (Units): 60  Total wasted (Units): none wasted Pt tolerated procedure well without complications.   Reinjection is anticipated in 3 months.

## 2016-10-16 NOTE — Telephone Encounter (Signed)
Rx called in to pharmacy. 

## 2016-10-16 NOTE — Telephone Encounter (Signed)
Last office visit 09/29/2016.  Last refilled 08/14/2016 for #30 with 1 refill.  Ok to refill?

## 2016-10-19 DIAGNOSIS — D7589 Other specified diseases of blood and blood-forming organs: Secondary | ICD-10-CM | POA: Diagnosis not present

## 2016-10-19 DIAGNOSIS — M9901 Segmental and somatic dysfunction of cervical region: Secondary | ICD-10-CM | POA: Diagnosis not present

## 2016-10-19 DIAGNOSIS — E1142 Type 2 diabetes mellitus with diabetic polyneuropathy: Secondary | ICD-10-CM | POA: Diagnosis not present

## 2016-10-19 DIAGNOSIS — Z9884 Bariatric surgery status: Secondary | ICD-10-CM | POA: Diagnosis not present

## 2016-10-19 DIAGNOSIS — M50322 Other cervical disc degeneration at C5-C6 level: Secondary | ICD-10-CM | POA: Diagnosis not present

## 2016-10-19 DIAGNOSIS — Z5181 Encounter for therapeutic drug level monitoring: Secondary | ICD-10-CM | POA: Diagnosis not present

## 2016-10-19 DIAGNOSIS — K911 Postgastric surgery syndromes: Secondary | ICD-10-CM | POA: Diagnosis not present

## 2016-10-19 DIAGNOSIS — Z9189 Other specified personal risk factors, not elsewhere classified: Secondary | ICD-10-CM | POA: Diagnosis not present

## 2016-10-21 DIAGNOSIS — M50322 Other cervical disc degeneration at C5-C6 level: Secondary | ICD-10-CM | POA: Diagnosis not present

## 2016-10-21 DIAGNOSIS — M9901 Segmental and somatic dysfunction of cervical region: Secondary | ICD-10-CM | POA: Diagnosis not present

## 2016-10-26 ENCOUNTER — Other Ambulatory Visit: Payer: Self-pay | Admitting: Family Medicine

## 2016-10-26 NOTE — Telephone Encounter (Signed)
Last office visit 09/29/2016.  Not on current medication list.  Refill?

## 2016-10-30 ENCOUNTER — Ambulatory Visit (INDEPENDENT_AMBULATORY_CARE_PROVIDER_SITE_OTHER): Payer: Medicare Other | Admitting: Family Medicine

## 2016-10-30 ENCOUNTER — Encounter: Payer: Self-pay | Admitting: Family Medicine

## 2016-10-30 ENCOUNTER — Ambulatory Visit (INDEPENDENT_AMBULATORY_CARE_PROVIDER_SITE_OTHER)
Admission: RE | Admit: 2016-10-30 | Discharge: 2016-10-30 | Disposition: A | Discharge status: Home / Self Care | Payer: Medicare Other | Source: Ambulatory Visit | Attending: Family Medicine | Admitting: Family Medicine

## 2016-10-30 VITALS — BP 102/62 | HR 75 | Temp 97.5°F | Ht 72.0 in | Wt 190.0 lb

## 2016-10-30 DIAGNOSIS — M4802 Spinal stenosis, cervical region: Secondary | ICD-10-CM | POA: Diagnosis not present

## 2016-10-30 DIAGNOSIS — M542 Cervicalgia: Secondary | ICD-10-CM | POA: Diagnosis not present

## 2016-10-30 DIAGNOSIS — M810 Age-related osteoporosis without current pathological fracture: Secondary | ICD-10-CM | POA: Diagnosis not present

## 2016-10-30 MED ORDER — CYCLOBENZAPRINE HCL 10 MG PO TABS: 10.0000 mg | ORAL_TABLET | Freq: Every evening | ORAL | 0 refills | Status: DC | PRN

## 2016-10-30 NOTE — Patient Instructions (Addendum)
We will call with  X-ray results.  Heat on neck. Massage on right upper shoulder.  Start muscle relaxant at night.

## 2016-10-30 NOTE — Progress Notes (Signed)
   Subjective:    Patient ID: [name], male    DOB: 1940-05-01, 76 y.o.   MRN: [id number]  HPI &76 year old male with complicated medical history including chronic low back pain, osteoarthritis, hx of drug induced osteoporosis presents  with   newneck pain.  He has had neck pain for 6 months. Pain in occiput right radiates to trapezius in neck and upper shoulder. Worse with turing neck a lot. No radiation of pain to arms, no fever.  No numbness, no weakness. 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 Ice helps some.  Muscle relaxant has helped some.   2 weeks ago fell in grass, lost balance, did not hit head or neck.. Was having pain before then. NO change.. Did not hit back   Hx of  Cervical stenosis and cervical surgery with rod placement years ago. Dr. Joya Salm.   he is on gabapentin   Seeing Dr. Carles Collet for migraine. This does not feel like migraine  Botox has not helped with migraine yet.   On coumadin and cannnot take NSAIDS.    Review of Systems  Constitutional: Negative for fatigue.  HENT: Negative for ear pain.   Eyes: Negative for pain.  Respiratory: Negative for shortness of breath.   Cardiovascular: Negative for chest pain.       Objective:   Physical Exam  Constitutional: Vital signs are normal. He appears well-developed and well-nourished.  HENT:  Head: Normocephalic.  Right Ear: Hearing normal.  Left Ear: Hearing normal.  Nose: Nose normal.  Mouth/Throat: Oropharynx is clear and moist and mucous membranes are normal.  Neck: Trachea normal. Carotid bruit is not present. No thyroid mass and no thyromegaly present.  Cardiovascular: Normal rate, regular rhythm and normal pulses.  Exam reveals no gallop, no distant heart sounds and no friction rub.   No murmur heard. No peripheral edema  Pulmonary/Chest: Effort normal and breath sounds normal. No respiratory distress.  Musculoskeletal:       Right shoulder: Normal.       Left shoulder: Normal.       Cervical back: He exhibits decreased range of motion, tenderness and bony tenderness. He exhibits no swelling, no edema and no deformity.  Neg spurling   Neurological: He has normal strength. No cranial nerve deficit or sensory deficit. He displays a negative Romberg sign. Gait normal.  Skin: Skin is warm, dry and intact. No rash noted.  Psychiatric: He has a normal mood and affect. His speech is normal and behavior is normal. Thought content normal.          Assessment & Plan:

## 2016-10-30 NOTE — Assessment & Plan Note (Signed)
Eval with x-ray given age, osteoporosis and hx of surgery with rod placement.  Most likely OA changes and muscle spasm. No sig of  Radiculopathy.  Treat with muscle relaxant, heat and massage.

## 2016-10-31 ENCOUNTER — Other Ambulatory Visit: Payer: Self-pay | Admitting: Family Medicine

## 2016-11-02 ENCOUNTER — Telehealth: Payer: Self-pay | Admitting: Family Medicine

## 2016-11-02 ENCOUNTER — Telehealth: Payer: Self-pay | Admitting: *Deleted

## 2016-11-02 DIAGNOSIS — I10 Essential (primary) hypertension: Secondary | ICD-10-CM | POA: Diagnosis not present

## 2016-11-02 DIAGNOSIS — M542 Cervicalgia: Secondary | ICD-10-CM | POA: Diagnosis not present

## 2016-11-02 NOTE — Telephone Encounter (Signed)
Best number [phone]  Pt returned your call

## 2016-11-02 NOTE — Telephone Encounter (Signed)
[name] notfied by telephone that his x-ray showed that his fusion and hardware are intact and in place.. No fracture.  Advised to use heat, massage and muscle relaxant.. If not improving after 2 weeks.. Follow up/call per Dr. Diona Browner.

## 2016-11-02 NOTE — Telephone Encounter (Signed)
Pt aware.

## 2016-11-02 NOTE — Telephone Encounter (Signed)
Last office visit 10/30/2016.  Not on current medication list.  Refill?

## 2016-11-02 NOTE — Telephone Encounter (Signed)
Received fax from [facility] stating Cyclobenzaprine 10 mg is approved through 10/30/2017 under Medicare Part D benefit.

## 2016-11-02 NOTE — Telephone Encounter (Signed)
Best number [phone]  Pt called needing to get a referral to [facility] spine with dr Nicole Kindred  He has an appointment today @10   He stated he needs this for insurance to pay for visit.

## 2016-11-02 NOTE — Telephone Encounter (Signed)
Referral not needed in this case.

## 2016-11-09 DIAGNOSIS — Z23 Encounter for immunization: Secondary | ICD-10-CM | POA: Diagnosis not present

## 2016-11-12 ENCOUNTER — Other Ambulatory Visit: Payer: Self-pay | Admitting: Neurosurgery

## 2016-11-12 DIAGNOSIS — M542 Cervicalgia: Secondary | ICD-10-CM

## 2016-11-12 DIAGNOSIS — H33311 Horseshoe tear of retina without detachment, right eye: Secondary | ICD-10-CM | POA: Diagnosis not present

## 2016-11-16 ENCOUNTER — Other Ambulatory Visit: Payer: Self-pay | Admitting: Family Medicine

## 2016-11-16 NOTE — Telephone Encounter (Signed)
Last office visit 10/30/2016.  Last refilled 10/30/2016 for #20 with no refills.  Ok to refill?

## 2016-11-18 ENCOUNTER — Other Ambulatory Visit: Payer: Self-pay | Admitting: Family Medicine

## 2016-11-18 NOTE — Telephone Encounter (Signed)
Last office visit 10/30/2016.  Last refilled 10/16/2016 for #30 with no refills.  Ok to refill?

## 2016-11-19 NOTE — Progress Notes (Deleted)
Subjective:    [name] was seen in consultation in the movement disorder clinic at the request of Dr. Joya Salm.  His PCP is Bedsole, Amy E, MD.  This patient is accompanied in the office by his child who supplements the history. The evaluation is for tremor.  I reviewed prior records that are available to me.  Looking back in his medical record, there are notes back to 2012 that indicate severe essential tremor.  Pt reports tremor has been worse since recent surgeries.  He had a shoulder surgery for bone spurs first and then had a shoulder replacement on the L and then had a cervical fusion surgery.  Prior to that, he had gastric bypass in 2013.    The patient is a 76 y.o. right handed male with a history of tremor.  Pt reports that tremor started after an episode of sepsis about 5 years ago.  Once he left the hospital, it was in the bilateral UE, overall slight but it has gotten worse over 8 months.  He cannot eat peas or the roll off the spoon.   There is no family hx of tremor.    Affected by caffeine:  No. Affected by alcohol: doesn't drink Affected by stress:  No. Affected by fatigue:  No. Spills soup if on spoon:  Yes.   Spills glass of liquid if full:  No. Affects ADL's (tying shoes, brushing teeth, etc):  No.  Pt had neck hematomas after surgery, which caused dysphagia.  A modified barium swallow was done on 08/14/2013 indicate severe cervical face dysphagia, moderate to severe pharyngeal phase dysphagia.  There is recommended if crush his medications or takes them in liquid form.  Outpatient speech language pathology and nutrition consult was also recommended.  11/01/13 update:  Pt is returning for f/u.  Started primidone last visit.   Pt states that it has been helpful but thinks that we could go up on the medication.  No SE.  Had carpal tunnel release on the L.  States that it didn't help.  Has also gone to speech therapy for swallowing, as recommended and that did not  help.  01/02/14 update:  The patient returns for follow-up today.  He has a history of essential tremor.  Last visit, his primidone was increased 50 mg twice a day.  "I think that if you increased it one more time, we will have it just right."  I got a note from his ENT physician at [facility].  They agreed that dysphagia was related to prior cervical spine surgery.  They told him that loss of balance is not due to an inner ear pathology and he was told to follow-up here.  He does have a history of diabetic peripheral neuropathy.  Pt states that balance has been off since the neck surgery and thinks that it is related.  He states that it actually has gotten slowly better.  Balance isn't great but he isn't falling anymore either which used to be a big problem.  05/02/14 update:  His primidone was increased last visit so that he is taking 100 mg in the morning and he remains on 50 mg at night.  The patient states that tremor has tremor is doing well.  He states that tremor is well controlled.  He only spills something or notices tremor 1-2 days a month, which is markedly improved.  No SE with the tremor.    11/02/14 update:  The patient has a history of essential  tremor and is on primidone, 100 mg in the morning and 50 mg at night. He thinks that tremor is well controlled.   I reviewed records since our last visit.  He has undergone a rather extensive workup for epigastric pain and weight loss.  He is a CT of the abdomen that was negative.  His H. pylori antibodies were negative.  He had a stress Myoview and while there was some evidence of ischemia, the patient did not believe that this was cardiac related and did not want to proceed with a cath.  He has seen gastroenterology and has had an EGD.  He was started on carafate and he thinks that it is helping.  He thinks that much of it is from the gastric bypass as he has emesis after the meals.    11/27/14 update:  Pt is f/u much earlier than expected.  On  primidone, 100 mg in AM, 50 mg at night.  The records that were made available to me were reviewed since last visit.  Had to have penile prosthesis removed that malfunctioned and insert a new one. Also, had surgery for his tear ducts yesterday. His wife states that Sunday AM he was getting out of the shower and he screamed and he felt like he was getting stabbed in the back of the head.  He's had this in the past but not this severe.  It is sore to the touch behind the ear.  It was awful for 15 min (felt like on fire) but if he turns his head to the right, he still feels it.    03/22/15 update:  The patient follows up today regarding his essential tremor.  He is on primidone, 100 mg in the morning and 50 g at night.  Tremor has slightly increased with time.   Last visit, which was in October, we did an occipital nerve block for right occipital neuralgia.  This was done on 11/28/2014.  He states that it did not help and he has been having a daily headache.  He continues to have a posterior occipital headache that radiates to the frontal region but he points to the left side today.  He states that the fingers on the L thumb, pointer and middle finger are numb.  States had Lcarpal tunnel surgery a year ago but these sx's started after shoulder replacement surgery and is convinced that something happened; no neck pain but has had prior neck surgery.  The shoulder replacement surgery was done first and the CTS was done a month later.     I did review records since our last visit.  He underwent surgery for an internal hernia repair on 03/08/2015.  09/30/15 update:  The patient follows up today.  He has a history of essential tremor and last visit I slightly increased his primidone to that he is taking 100 mg both in the morning and at night.  He states that he still has tremor but that is fairly stable and not worse.  He was complaining about left-sided hand paresthesias last visit and we ended up doing an EMG on file  14th, 2017.  This was normal, without evidence of focal neuropathy or radiculopathy.  C/o pain in back of the head that will feel like he is getting hit in the head on either and then will go away within a few seconds.  Has had a few times in the last few months but always keeps a "low grade headache."   No  hx of stomach ulcer.  No diabetic meds x many months.  He is unsure why he takes the gabapentin but thinks it may be for diabetic neuropathy and gouty pain.    05/19/16 update:  Patient seen today in follow-up.  He is on primidone, 100 mg twice a day.   He had CTS by Dr. Amedeo Plenty on the L 4 weeks ago and on the R 6 weeks ago.  In regards to headache, the patient reports that they have been about the same.  He has 30 headache days per month.  They are frontal and occipital.  They can be moderate to severe but they are not that way all the time.  Remains on gabapentin, 300 mg twice per day.  Asks for refill of that.    10/16/16 update:  Pt seen today.  Was here with daughter who supplements the history.  Is here for botox for migraine but brings up other issues as well.  Daughter reveals patient taking tylenol, many daily, and states been going on since 1970's!  Also she states pt very off balance.  Recently fell off ladder.  Did have syncopal episode in recent past but because BS changes.  His BS are very out of control and has appt with endocrinology.  11/20/16 update:  Patient seen today in follow-up.  Patient had Botox on 10/16/2016.  We talked about discontinuing Tylenol at that visit as well.  The patient states that ***  Current/Previously tried tremor medications: no  Current medications that may exacerbate tremor:  n/a  Outside reports reviewed: historical medical records, lab reports and referral letter/letters.  Allergies  Allergen Reactions  . Sulfacetamide Sodium Swelling    throat swelling  . Adhesive [Tape] Other (See Comments)    Tears skin-- "No tape of any kind, they all tear skin  right off" Tears skin-- "No tape of any kind, they all tear skin right off"  . Latex Itching    Current Outpatient Prescriptions on File Prior to Visit  Medication Sig Dispense Refill  . acarbose (PRECOSE) 25 MG tablet Take 25 mg by mouth 2 (two) times daily.     . ASMANEX 60 METERED DOSES 220 MCG/INH inhaler INHALE 2 PUFFS INTO THE LUNGS DAILY. 1 Inhaler 11  . atorvastatin (LIPITOR) 80 MG tablet TAKE 1 TABLET BY MOUTH AT  BEDTIME 90 tablet 0  . cyclobenzaprine (FLEXERIL) 10 MG tablet TAKE 1 TABLET (10 MG TOTAL) BY MOUTH AT BEDTIME AS NEEDED FOR MUSCLE SPASMS. 30 tablet 0  . FLUoxetine (PROZAC) 40 MG capsule TAKE 1 CAPSULE BY MOUTH  DAILY 90 capsule 1  . furosemide (LASIX) 20 MG tablet TAKE 1 TO 2 TABLETS BY  MOUTH DAILY 180 tablet 1  . gabapentin (NEURONTIN) 300 MG capsule Take 1 capsule (300 mg total) by mouth 2 (two) times daily. 180 capsule 1  . hydrocortisone 2.5 % cream APPLY ON THE SKIN TWICE A DAILY TO GROINS, ARMS, AND SIDES FOR RASH  4  . lansoprazole (PREVACID) 30 MG capsule TAKE 1 CAPSULE (30 MG TOTAL) BY MOUTH DAILY BEFORE BREAKFAST.  5  . levofloxacin (LEVAQUIN) 500 MG tablet Take 1 tablet (500 mg total) by mouth daily. 7 tablet 0  . lidocaine (XYLOCAINE) 5 % ointment Apply 1 application topically daily. APPLIED TO AFFECTED AREA OF FOOT  2  . lisinopril (PRINIVIL,ZESTRIL) 2.5 MG tablet Take 2.5 mg by mouth daily.     . NONFORMULARY OR COMPOUNDED ITEM Apply 1 application topically daily. APPLY TO AFFECTED AREAS  DAILY (DO NOT APPLY TO FACE, GROIN, UNDERARMS, OR NECK)  2  . nystatin cream (MYCOSTATIN) Apply 1 application topically daily. APPLIED AFTER BATHING    . omeprazole (PRILOSEC) 40 MG capsule TAKE 1 CAPSULE BY MOUTH  DAILY 90 capsule 1  . primidone (MYSOLINE) 50 MG tablet Take 2 tablets (100 mg total) by mouth 2 (two) times daily. 360 tablet 1  . ranitidine (ZANTAC) 300 MG tablet Take 300 mg by mouth at bedtime.     . silodosin (RAPAFLO) 8 MG CAPS capsule Take 8 mg by mouth  daily with breakfast.     . sucralfate (CARAFATE) 1 g tablet Take 1 g by mouth daily before breakfast.     . triamcinolone cream (KENALOG) 0.1 % APPLY TO AFFECTED AREA TWICE A DAY  2  . warfarin (COUMADIN) 2.5 MG tablet TAKE 1 TO 1.5 TABLETS BY  MOUTH DAILY OR AS DIRECTED  BY COUMADIN CLINIC (Patient taking differently: TAKE 1 TABLET (2.5 MG) ON SUNDAY, TUESDAY & THURSDAYS. TAKE 1.5 TABLETS (3.75 MG) BY MOUTH ON MONDAY, WEDNESDAY, FRIDAY, & SATURDAY.) 135 tablet 1  . zolpidem (AMBIEN) 10 MG tablet TAKE 1 TABLET BY MOUTH AT BEDTIME AS NEEDED FOR SLEEP 30 tablet 0   No current facility-administered medications on file prior to visit.     Past Medical History:  Diagnosis Date  . Anxiety state, unspecified   . Arthritis    hands   . Bruises easily    pt is on Coumadin  . Cancer ([place])    SKIN CANCER REMOVED  . Chronic atrial fibrillation (HCC)    a.fib on warfarin- Dr. Percival Spanish follows  . Depressive disorder, not elsewhere classified    takes Prozac daily  . Diverticulosis of colon (without mention of hemorrhage)   . ED (erectile dysfunction)   . GERD (gastroesophageal reflux disease)   . Hard of hearing    wears hearing aids  . History of blood clots    behind right knee and then went into left lung 81yrs ago  . History of colon polyps   . History of kidney stones   . Hx of diabetes mellitus    "no longer diabetic since gastric bypass" - no longer taking metformin"  in 2 months "problems with low blood sugar now"  . Hx of gout    but doesn't take any meds  . Inflammation of colonic mucosa    recent admission and release from [facility] 02-28-15-remains on oral antibiotic  . Joint pain   . Peripheral edema    takes Furosemide daily  . Peripheral vascular disease ([place])   . Pneumonia    last time about 37yrs ago  . Pulmonary embolism ([place])    over 10 yrs ago "many years ago"  . Restless legs syndrome (RLS)   . Unspecified asthma(493.90)    as a child  . Unspecified essential  hypertension    hx of no longer on medication due to gastric bypass     Past Surgical History:  Procedure Laterality Date  . ANTERIOR CERVICAL DECOMP/DISCECTOMY FUSION N/A 05/09/2013   Procedure: ANTERIOR CERVICAL DECOMPRESSION/DISCECTOMY FUSION CERVICAL THREE-FOUR,CERVICAL FOUR-FIVE,CERVICAL FIVE-SIX;  Surgeon: Floyce Stakes, MD;  Location: [facility];  Service: Neurosurgery;  Laterality: N/A;  . ARTERY REPAIR     Left forearm  . BACK SURGERY     x 3  . BREATH TEK H PYLORI  01/08/2011   Procedure: BREATH TEK H PYLORI;  Surgeon: Pedro Earls, MD;  Location: [facility] ENDOSCOPY;  Service: General;  Laterality: N/A;  . CARDIOVERSION     x 2 attempts-unsuccessful.  [name] CATARACT EXTRACTION, BILATERAL    . COLONOSCOPY    . CYSTOSCOPY    . ESOPHAGOGASTRODUODENOSCOPY (EGD) WITH PROPOFOL N/A 09/21/2014   Procedure: ESOPHAGOGASTRODUODENOSCOPY (EGD) WITH PROPOFOL;  Surgeon: Manya Silvas, MD;  Location: [facility] ENDOSCOPY;  Service: Endoscopy;  Laterality: N/A;  . EYE SURGERY     cataract bil  . FOOT SURGERY     Left foot   . GASTRIC ROUX-EN-Y  08/11/2011   Procedure: LAPAROSCOPIC ROUX-EN-Y GASTRIC BYPASS WITH UPPER ENDOSCOPY;  Surgeon: Pedro Earls, MD;  Location: [facility] ORS;  Service: General;  Laterality: N/A;  . HAND SURGERY     LEFT  . injections in back     x 18  . KNEE SURGERY Right    x 4  . KNEE SURGERY Left    arthroscopy  . LAPAROSCOPIC INTERNAL HERNIA REPAIR N/A 03/08/2015   Procedure: LAPAROSCOPIC INTERNAL HERNIA REPAIR ;  Surgeon: [name], MD;  Location: [facility] ORS;  Service: General;  Laterality: N/A;  . LEG SURGERY     FOR NECROTIZING FASCITIS L LEG AND GROIN  . PANNICULECTOMY N/A 08/25/2016   Procedure: PANNICULECTOMY;  Surgeon: [name], MD;  Location: [facility] ORS;  Service: General;  Laterality: N/A;  . PENILE PROSTHESIS IMPLANT N/A 07/02/2014   Procedure: PENILE PROTHESIS INFLATABLE 3 PIECE (COLOPLAST) SCROTAL APPROACH;  Surgeon: Kathie Rhodes, MD;  Location: [facility] ORS;   Service: Urology;  Laterality: N/A;  . PENILE PROSTHESIS IMPLANT N/A 11/23/2014   Procedure: EXPLORATION AND REVISION OF PENILE PROSTHESIS;  Surgeon: Kathie Rhodes, MD;  Location: [facility] ORS;  Service: Urology;  Laterality: N/A;  . REPLACEMENT TOTAL KNEE Left    x 2  . SHOULDER ARTHROSCOPY    . SHOULDER SURGERY Left 2014  . TONSILLECTOMY    . TOTAL SHOULDER ARTHROPLASTY Left 01/26/2013   Procedure: TOTAL SHOULDER ARTHROPLASTY;  Surgeon: Nita Sells, MD;  Location: [facility];  Service: Orthopedics;  Laterality: Left;  Left total shoulder arthroplasty    Social History   Social History  . Marital status: Married    Spouse name: N/A  . Number of children: 1  . Years of education: N/A   Occupational History  . Retired     Administrator   Social History Main Topics  . Smoking status: Never Smoker  . Smokeless tobacco: Never Used  . Alcohol use No  . Drug use: No  . Sexual activity: Yes   Other Topics Concern  . Not on file   Social History Narrative   DIET: 3 meals, F&V, some water, crystal light, No fast food   Exercise: walks treadmill 30 min      1 Caffeine drinks daily       No living will.           Family Status  Relation Status  . Mother Deceased       breast/uterine cancer  . Father Deceased       emphysema, heart disease  . Sister Deceased       alzheimer's disease  . Brother Deceased       struck by lightening  . Brother Deceased       colon cancer  . Brother Deceased       heart disease  . Brother Alive       prostate cancer  . Daughter Alive       healthy  . Sister (Not Specified)  .  Brother (Not Specified)  . Brother (Not Specified)  . Brother (Not Specified)    Review of Systems   A complete 10 system ROS was obtained and was negative apart from what is mentioned.   Objective:   VITALS:   There were no vitals filed for this visit. Wt Readings from Last 3 Encounters:  10/30/16 190 lb (86.2 kg)  09/29/16 196 lb (88.9 kg)   09/23/16 189 lb 8 oz (86 kg)     Gen:  Appears stated age and in NAD. HEENT:  Normocephalic.  Small abrasion over left eye where ladder hit him when he fell. The mucous membranes are moist. The superficial temporal arteries are without ropiness or tenderness. CV:  RRR Lungs: CTAB Neck:  No bruits. There is significant tenderness over the right occipital notch   NEUROLOGICAL:  Orientation:  The patient is alert and oriented x 3 Cranial nerves: There is good facial symmetry, but there is chronic L ptosis. Extraocular muscles are intact and visual fields are full to confrontational testing. Speech is fluent and clear. Soft palate rises symmetrically and there is no tongue deviation. Hearing is intact to conversational tone. Tone: Tone is good throughout.  Mild gegenhalten. Sensation: Sensation is intact to light touch throughout.  Hands are cold bilaterally. Coordination:  The patient has no dysdiadichokinesia or dysmetria. Motor: Strength is 5/5 in the bilateral upper and lower extremities.  Shoulder shrug is equal bilaterally.  There is no pronator drift.   Gait and Station: The patient ambulates well today  MOVEMENT EXAM: Tremor:  There is no tremor noted today.  LABS    Chemistry      Component Value Date/Time   NA 138 09/11/2016 1135   K 4.5 09/11/2016 1135   CL 107 09/11/2016 1135   CO2 26 09/11/2016 1135   BUN 23 09/11/2016 1135   CREATININE 1.60 (H) 09/11/2016 1135      Component Value Date/Time   CALCIUM 8.6 09/11/2016 1135   ALKPHOS 64 09/11/2016 1135   AST 26 09/11/2016 1135   ALT 37 09/11/2016 1135   BILITOT 0.3 09/11/2016 1135          Assessment/Plan:   1.  Essential Tremor.  -improved on primidone.  Continue with two in the AM (100 mg) and 100mg  at night.  Risks, benefits, side effects and alternative therapies were discussed.  The opportunity to ask questions was given and they were answered to the best of my ability.  The patient expressed  understanding and willingness to follow the outlined treatment protocols.  Refills provided today 2.  Gait instability  -It is believed that ET alone can cause ataxia/balance changes due to changes in the cerebellar purkinje cell/climbing fiber synaptic transmission.   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  This is getting worse and patient reports blood sugars have been out of control lately and has an upcoming appt with endocrinology.  Asked him to make a f/u appt here as well and will discuss in more detail.  Safety discussed 3.  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  Pt wanted lid lift but ophthalmology didn't want to proceed  5.  Headache  -no topamax due to hx of nephrolithiasis.   -gabapentin not providing enough relief.  Still having headache.  Wants a refill and provided.  300 mg bid.  -started botox today for chronic migraine  -talked to him about d/c tylenol.  Long discussion about rebound headache with patient and daughter and tylenol safety. 6.  Follow up in october

## 2016-11-20 ENCOUNTER — Ambulatory Visit: Payer: Medicare Other | Admitting: Neurology

## 2016-11-20 NOTE — Telephone Encounter (Signed)
Zolpidem called into [id number] - [place], [place] Phone: [phone]

## 2016-11-23 ENCOUNTER — Ambulatory Visit (INDEPENDENT_AMBULATORY_CARE_PROVIDER_SITE_OTHER): Payer: Medicare Other

## 2016-11-23 ENCOUNTER — Ambulatory Visit
Admission: RE | Admit: 2016-11-23 | Discharge: 2016-11-23 | Disposition: A | Discharge status: Home / Self Care | Payer: Medicare Other | Source: Ambulatory Visit | Attending: Neurosurgery | Admitting: Neurosurgery

## 2016-11-23 DIAGNOSIS — I824Y9 Acute embolism and thrombosis of unspecified deep veins of unspecified proximal lower extremity: Secondary | ICD-10-CM

## 2016-11-23 DIAGNOSIS — Z5181 Encounter for therapeutic drug level monitoring: Secondary | ICD-10-CM

## 2016-11-23 DIAGNOSIS — Z7901 Long term (current) use of anticoagulants: Secondary | ICD-10-CM | POA: Diagnosis not present

## 2016-11-23 DIAGNOSIS — M50223 Other cervical disc displacement at C6-C7 level: Secondary | ICD-10-CM | POA: Diagnosis not present

## 2016-11-23 DIAGNOSIS — M542 Cervicalgia: Secondary | ICD-10-CM

## 2016-11-23 DIAGNOSIS — I4891 Unspecified atrial fibrillation: Secondary | ICD-10-CM | POA: Diagnosis not present

## 2016-11-23 LAB — POCT INR: INR: 3

## 2016-11-29 ENCOUNTER — Other Ambulatory Visit: Payer: Self-pay | Admitting: Family Medicine

## 2016-11-29 ENCOUNTER — Other Ambulatory Visit: Payer: Self-pay | Admitting: Cardiology

## 2016-11-29 ENCOUNTER — Encounter: Payer: Self-pay | Admitting: Cardiology

## 2016-11-29 NOTE — Progress Notes (Signed)
HPI The patient presents for followup of atrial fibrillation.  Since I last saw him he had wrist surgery.  Since I last saw him he has done well.  The patient denies any new symptoms such as chest discomfort, neck or arm discomfort. There has been no new shortness of breath, PND or orthopnea. There have been no reported palpitations, presyncope or syncope.  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    Allergies  Allergen Reactions  . Sulfacetamide Sodium Swelling    throat swelling  . Adhesive [Tape] Other (See Comments)    Tears skin-- "No tape of any kind, they all tear skin right off" Tears skin-- "No tape of any kind, they all tear skin right off"  . Latex Itching    Current Outpatient Prescriptions  Medication Sig Dispense Refill  . FLUoxetine (PROZAC) 40 MG capsule TAKE 1 CAPSULE BY MOUTH  DAILY 90 capsule 1  . furosemide (LASIX) 20 MG tablet TAKE 1 TO 2 TABLETS BY  MOUTH DAILY 180 tablet 1  . gabapentin (NEURONTIN) 300 MG capsule Take 1 capsule (300 mg total) by mouth 2 (two) times daily. 180 capsule 1  . lisinopril (PRINIVIL,ZESTRIL) 2.5 MG tablet Take 2.5 mg by mouth daily.     [name] omeprazole (PRILOSEC) 40 MG capsule TAKE 1 CAPSULE BY MOUTH  DAILY 90 capsule 1  . primidone (MYSOLINE) 50 MG tablet Take 2 tablets (100 mg total) by mouth 2 (two) times daily. 360 tablet 1  . silodosin (RAPAFLO) 8 MG CAPS capsule Take 8 mg by mouth daily with breakfast.     . sucralfate (CARAFATE) 1 g tablet Take 1 g by mouth daily before breakfast.     . warfarin (COUMADIN) 2.5 MG tablet TAKE 1 TO 1.5 TABLETS BY  MOUTH DAILY OR AS DIRECTED  BY COUMADIN CLINIC 60 tablet 1  . zolpidem (AMBIEN) 10 MG tablet TAKE 1 TABLET BY MOUTH AT BEDTIME AS NEEDED 30 tablet 1  . atorvastatin (LIPITOR) 80 MG tablet TAKE 1 TABLET BY MOUTH AT  BEDTIME 90 tablet 3  . cyclobenzaprine (FLEXERIL) 10 MG tablet TAKE 1 TABLET (10 MG TOTAL) BY MOUTH AT BEDTIME AS NEEDED FOR MUSCLE SPASMS. 30 tablet 0   No current  facility-administered medications for this visit.     Past Medical History:  Diagnosis Date  . Anxiety state, unspecified   . Arthritis    hands   . Cancer ([place])    SKIN CANCER REMOVED  . Chronic atrial fibrillation (HCC)    a.fib on warfarin- Dr. Percival Spanish follows  . Depressive disorder, not elsewhere classified    takes Prozac daily  . Diverticulosis of colon (without mention of hemorrhage)   . ED (erectile dysfunction)   . GERD (gastroesophageal reflux disease)   . Hard of hearing    wears hearing aids  . History of blood clots    behind right knee and then went into left lung 37yrs ago  . History of colon polyps   . History of kidney stones   . Hx of diabetes mellitus    "no longer diabetic since gastric bypass" - no longer taking metformin"  in 2 months "problems with low blood sugar now"  . Hx of gout    but doesn't take any meds  . Inflammation of colonic mucosa    recent admission and release from [facility] 02-28-15-remains on oral antibiotic  . Peripheral vascular disease ([place])   . Pneumonia    last time about 71yrs  ago  . Pulmonary embolism ([place])    over 10 yrs ago "many years ago"  . Restless legs syndrome (RLS)   . Unspecified asthma(493.90)    as a child  . Unspecified essential hypertension    hx of no longer on medication due to gastric bypass     Past Surgical History:  Procedure Laterality Date  . ANTERIOR CERVICAL DECOMP/DISCECTOMY FUSION N/A 05/09/2013   Procedure: ANTERIOR CERVICAL DECOMPRESSION/DISCECTOMY FUSION CERVICAL THREE-FOUR,CERVICAL FOUR-FIVE,CERVICAL FIVE-SIX;  Surgeon: Floyce Stakes, MD;  Location: [facility];  Service: Neurosurgery;  Laterality: N/A;  . ARTERY REPAIR     Left forearm  . BACK SURGERY     x 3  . BREATH TEK H PYLORI  01/08/2011   Procedure: BREATH TEK H PYLORI;  Surgeon: Pedro Earls, MD;  Location: [facility];  Service: General;  Laterality: N/A;  . CARDIOVERSION     x 2 attempts-unsuccessful.  [name] CATARACT  EXTRACTION, BILATERAL    . COLONOSCOPY    . CYSTOSCOPY    . ESOPHAGOGASTRODUODENOSCOPY (EGD) WITH PROPOFOL N/A 09/21/2014   Procedure: ESOPHAGOGASTRODUODENOSCOPY (EGD) WITH PROPOFOL;  Surgeon: Manya Silvas, MD;  Location: [facility] ENDOSCOPY;  Service: Endoscopy;  Laterality: N/A;  . EYE SURGERY     cataract bil  . FOOT SURGERY     Left foot   . GASTRIC ROUX-EN-Y  08/11/2011   Procedure: LAPAROSCOPIC ROUX-EN-Y GASTRIC BYPASS WITH UPPER ENDOSCOPY;  Surgeon: Pedro Earls, MD;  Location: [facility];  Service: General;  Laterality: N/A;  . HAND SURGERY     LEFT  . injections in back     x 18  . KNEE SURGERY Right    x 4  . KNEE SURGERY Left    arthroscopy  . LAPAROSCOPIC INTERNAL HERNIA REPAIR N/A 03/08/2015   Procedure: LAPAROSCOPIC INTERNAL HERNIA REPAIR ;  Surgeon: [name], MD;  Location: [facility];  Service: General;  Laterality: N/A;  . LEG SURGERY     FOR NECROTIZING FASCITIS L LEG AND GROIN  . PANNICULECTOMY N/A 08/25/2016   Procedure: PANNICULECTOMY;  Surgeon: [name], MD;  Location: [facility];  Service: General;  Laterality: N/A;  . PENILE PROSTHESIS IMPLANT N/A 07/02/2014   Procedure: PENILE PROTHESIS INFLATABLE 3 PIECE (COLOPLAST) SCROTAL APPROACH;  Surgeon: Kathie Rhodes, MD;  Location: [facility];  Service: Urology;  Laterality: N/A;  . PENILE PROSTHESIS IMPLANT N/A 11/23/2014   Procedure: EXPLORATION AND REVISION OF PENILE PROSTHESIS;  Surgeon: Kathie Rhodes, MD;  Location: [facility];  Service: Urology;  Laterality: N/A;  . REPLACEMENT TOTAL KNEE Left    x 2  . SHOULDER ARTHROSCOPY    . SHOULDER SURGERY Left 2014  . TONSILLECTOMY    . TOTAL SHOULDER ARTHROPLASTY Left 01/26/2013   Procedure: TOTAL SHOULDER ARTHROPLASTY;  Surgeon: Nita Sells, MD;  Location: [facility];  Service: Orthopedics;  Laterality: Left;  Left total shoulder arthroplasty    ROS:  ED and wrist pain.   Otherwise as stated in the HPI and negative for all other systems.  PHYSICAL EXAM BP 100/80 (BP  Location: Left Arm, Patient Position: Sitting, Cuff Size: Normal)   Pulse 86   Ht 6\' 1"  (1.854 m)   Wt 188 lb (85.3 kg)   BMI 24.80 kg/m   GENERAL:  Well appearing NECK:  No jugular venous distention, waveform within normal limits, carotid upstroke brisk and symmetric, no bruits, no thyromegaly LUNGS:  Clear to auscultation bilaterally CHEST:  Unremarkable HEART:  PMI not displaced or sustained,S1 and  S2 within normal limits, no S3, no S4, no clicks, no rubs, no murmurs ABD:  Flat, positive bowel sounds normal in frequency in pitch, no bruits, no rebound, no guarding, no midline pulsatile mass, no hepatomegaly, no splenomegaly EXT:  2 plus pulses throughout, no edema, no cyanosis no clubbing   EKG:  Atrial fibrillation, rate 68, axis within normal limits, intervals within normal limits, no acute ST-T wave changes. 08/21/16  ASSESSMENT AND PLAN  ATRIAL FIBRILLATION, PAROXYSMAL Mr. NOHA KARASIK has a CHA2DS2 - VASc score of 3.  He will continue with anticoagulation.  No change in therapy.   HYPERTENSION  The blood pressure is low.  He will let me know if it runs low and I will reduce the lisinopril.    OBESITY He has lost about 150 lbs over time.

## 2016-11-30 ENCOUNTER — Ambulatory Visit (INDEPENDENT_AMBULATORY_CARE_PROVIDER_SITE_OTHER): Payer: Medicare Other | Admitting: Cardiology

## 2016-11-30 ENCOUNTER — Other Ambulatory Visit: Payer: Self-pay | Admitting: Family Medicine

## 2016-11-30 ENCOUNTER — Encounter: Payer: Self-pay | Admitting: Cardiology

## 2016-11-30 VITALS — BP 100/80 | HR 86 | Ht 73.0 in | Wt 188.0 lb

## 2016-11-30 DIAGNOSIS — I10 Essential (primary) hypertension: Secondary | ICD-10-CM

## 2016-11-30 DIAGNOSIS — I482 Chronic atrial fibrillation, unspecified: Secondary | ICD-10-CM

## 2016-11-30 MED ORDER — WARFARIN SODIUM 2.5 MG PO TABS: ORAL_TABLET | ORAL | 1 refills | Status: DC

## 2016-11-30 NOTE — Patient Instructions (Signed)
Medication Instructions:  Continue current medications  If you need a refill on your cardiac medications before your next appointment, please call your pharmacy.  Labwork: None Ordered HERE  Testing/Procedures: None Ordered   Follow-Up: Your physician wants you to follow-up in: 1 Year. You should receive a reminder letter in the mail two months in advance. If you do not receive a letter, please call our office [phone].    Thank you for choosing [facility] at [facility]!!

## 2016-11-30 NOTE — Telephone Encounter (Signed)
Last office visit 10/30/2016.  Flexeril not on current medication list.  Refill?

## 2016-12-01 ENCOUNTER — Encounter: Payer: Self-pay | Admitting: Cardiology

## 2016-12-02 ENCOUNTER — Telehealth: Payer: Self-pay | Admitting: Cardiology

## 2016-12-02 ENCOUNTER — Other Ambulatory Visit: Payer: Self-pay | Admitting: *Deleted

## 2016-12-02 NOTE — Telephone Encounter (Signed)
Routed to pharmacy staff to advise on med interaction  Dr. Carles Collet Rx'ed primidone

## 2016-12-02 NOTE — Telephone Encounter (Signed)
Notified pharmacy staff  @ [facility] of recommendation. Will route to [facility], as patient has INR checked by Doroteo Bradford, Therapist, sports

## 2016-12-02 NOTE — Telephone Encounter (Signed)
LMOM for pt to call me back so I can schedule a follow up 5-7 days after the medication was started.

## 2016-12-02 NOTE — Telephone Encounter (Signed)
Primidone is fine with warfarin, but will need to monitor more closely when starts primidone (usually about 5-7 days after starting), as it can alter the INR.

## 2016-12-02 NOTE — Telephone Encounter (Signed)
Patients wife, [name] left a msg on the refill vm requesting a refill on patients coumadin be sent [facility]. She stated that he will run out of this medication before the shipment is received therefore she requested [facility] [place]. [name] can be reached at [phone]. Thanks, MI

## 2016-12-02 NOTE — Telephone Encounter (Signed)
Wife called back & stated the pt has been on the medication for 2-3 years. After assessing the pt's chart was able to note that the pt has been on the [place]. Advised that if pt has any other changes in medications to call with [place] & she verbalized understanding.

## 2016-12-02 NOTE — Telephone Encounter (Signed)
New message     Pt c/o medication issue:  1. Name of Medication:  primidone (MYSOLINE) 50 MG tablet and warafrin 2.5mg  Drug interaction of these  2 medication

## 2016-12-03 ENCOUNTER — Other Ambulatory Visit: Payer: Self-pay | Admitting: Pharmacist Clinician (PhC)/ Clinical Pharmacy Specialist

## 2016-12-03 MED ORDER — WARFARIN SODIUM 2.5 MG PO TABS: ORAL_TABLET | ORAL | 1 refills | Status: DC

## 2016-12-09 ENCOUNTER — Other Ambulatory Visit: Payer: Self-pay | Admitting: Neurology

## 2016-12-14 ENCOUNTER — Telehealth: Payer: Self-pay | Admitting: Neurology

## 2016-12-14 ENCOUNTER — Telehealth: Payer: Self-pay | Admitting: Cardiology

## 2016-12-14 DIAGNOSIS — S83241A Other tear of medial meniscus, current injury, right knee, initial encounter: Secondary | ICD-10-CM | POA: Diagnosis not present

## 2016-12-14 DIAGNOSIS — H33301 Unspecified retinal break, right eye: Secondary | ICD-10-CM | POA: Diagnosis not present

## 2016-12-14 DIAGNOSIS — M25561 Pain in right knee: Secondary | ICD-10-CM | POA: Diagnosis not present

## 2016-12-14 NOTE — Telephone Encounter (Signed)
Mark Carnaguy calling to verify if it is okay to give patient mobic (6.5 g)

## 2016-12-14 NOTE — Telephone Encounter (Signed)
Options for him and tremor will be limited.  He is on coumadin so don't want to go up too much more on primidone but could if do more frequent INR checks.  BP has been low per cardiology so not sure I want to add beta blocker.  Probably needs to be seen BUT may not be a lot more to be done for tremor unless we do surgery and I'm not sure he would be candidate for that.

## 2016-12-14 NOTE — Telephone Encounter (Signed)
Patient did call back and confirm he will be at appt next Tuesday.
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
## 2016-12-14 NOTE — Telephone Encounter (Signed)
Pt's wife called and said that pt's tremors are getting really bad and would like a call back to please advise CB# [phone] ([phone]), she said ok to leave a message

## 2016-12-15 NOTE — Telephone Encounter (Signed)
In general we prefer not to use anti-inflammatory medications, but patient need sometimes outweighs this.  Please let them know that for short term it can be used (I usually recommend 2-3 days then just as needed no more than 2-3 doses per week).  If patient and other MD feel is needed for longer please just be aware of  increased risk of stroke/MI

## 2016-12-17 NOTE — Telephone Encounter (Signed)
Left message to call back  

## 2016-12-18 NOTE — Progress Notes (Signed)
Subjective:    [name] was seen in consultation in the movement disorder clinic at the request of Dr. Joya Salm.  His PCP is Bedsole, Amy E, MD.  This patient is accompanied in the office by his child who supplements the history. The evaluation is for tremor.  I reviewed prior records that are available to me.  Looking back in his medical record, there are notes back to 2012 that indicate severe essential tremor.  Pt reports tremor has been worse since recent surgeries.  He had a shoulder surgery for bone spurs first and then had a shoulder replacement on the L and then had a cervical fusion surgery.  Prior to that, he had gastric bypass in 2013.    The patient is a 76 y.o. right handed male with a history of tremor.  Pt reports that tremor started after an episode of sepsis about 5 years ago.  Once he left the hospital, it was in the bilateral UE, overall slight but it has gotten worse over 8 months.  He cannot eat peas or the roll off the spoon.   There is no family hx of tremor.    Affected by caffeine:  No. Affected by alcohol: doesn't drink Affected by stress:  No. Affected by fatigue:  No. Spills soup if on spoon:  Yes.   Spills glass of liquid if full:  No. Affects ADL's (tying shoes, brushing teeth, etc):  No.  Pt had neck hematomas after surgery, which caused dysphagia.  A modified barium swallow was done on 08/14/2013 indicate severe cervical face dysphagia, moderate to severe pharyngeal phase dysphagia.  There is recommended if crush his medications or takes them in liquid form.  Outpatient speech language pathology and nutrition consult was also recommended.  11/01/13 update:  Pt is returning for f/u.  Started primidone last visit.   Pt states that it has been helpful but thinks that we could go up on the medication.  No SE.  Had carpal tunnel release on the L.  States that it didn't help.  Has also gone to speech therapy for swallowing, as recommended and that did not  help.  01/02/14 update:  The patient returns for follow-up today.  He has a history of essential tremor.  Last visit, his primidone was increased 50 mg twice a day.  "I think that if you increased it one more time, we will have it just right."  I got a note from his ENT physician at [facility].  They agreed that dysphagia was related to prior cervical spine surgery.  They told him that loss of balance is not due to an inner ear pathology and he was told to follow-up here.  He does have a history of diabetic peripheral neuropathy.  Pt states that balance has been off since the neck surgery and thinks that it is related.  He states that it actually has gotten slowly better.  Balance isn't great but he isn't falling anymore either which used to be a big problem.  05/02/14 update:  His primidone was increased last visit so that he is taking 100 mg in the morning and he remains on 50 mg at night.  The patient states that tremor has tremor is doing well.  He states that tremor is well controlled.  He only spills something or notices tremor 1-2 days a month, which is markedly improved.  No SE with the tremor.    11/02/14 update:  The patient has a history of essential  tremor and is on primidone, 100 mg in the morning and 50 mg at night. He thinks that tremor is well controlled.   I reviewed records since our last visit.  He has undergone a rather extensive workup for epigastric pain and weight loss.  He is a CT of the abdomen that was negative.  His H. pylori antibodies were negative.  He had a stress Myoview and while there was some evidence of ischemia, the patient did not believe that this was cardiac related and did not want to proceed with a cath.  He has seen gastroenterology and has had an EGD.  He was started on carafate and he thinks that it is helping.  He thinks that much of it is from the gastric bypass as he has emesis after the meals.    11/27/14 update:  Pt is f/u much earlier than expected.  On  primidone, 100 mg in AM, 50 mg at night.  The records that were made available to me were reviewed since last visit.  Had to have penile prosthesis removed that malfunctioned and insert a new one. Also, had surgery for his tear ducts yesterday. His wife states that Sunday AM he was getting out of the shower and he screamed and he felt like he was getting stabbed in the back of the head.  He's had this in the past but not this severe.  It is sore to the touch behind the ear.  It was awful for 15 min (felt like on fire) but if he turns his head to the right, he still feels it.    03/22/15 update:  The patient follows up today regarding his essential tremor.  He is on primidone, 100 mg in the morning and 50 g at night.  Tremor has slightly increased with time.   Last visit, which was in October, we did an occipital nerve block for right occipital neuralgia.  This was done on 11/28/2014.  He states that it did not help and he has been having a daily headache.  He continues to have a posterior occipital headache that radiates to the frontal region but he points to the left side today.  He states that the fingers on the L thumb, pointer and middle finger are numb.  States had Lcarpal tunnel surgery a year ago but these sx's started after shoulder replacement surgery and is convinced that something happened; no neck pain but has had prior neck surgery.  The shoulder replacement surgery was done first and the CTS was done a month later.     I did review records since our last visit.  He underwent surgery for an internal hernia repair on 03/08/2015.  09/30/15 update:  The patient follows up today.  He has a history of essential tremor and last visit I slightly increased his primidone to that he is taking 100 mg both in the morning and at night.  He states that he still has tremor but that is fairly stable and not worse.  He was complaining about left-sided hand paresthesias last visit and we ended up doing an EMG on file  14th, 2017.  This was normal, without evidence of focal neuropathy or radiculopathy.  C/o pain in back of the head that will feel like he is getting hit in the head on either and then will go away within a few seconds.  Has had a few times in the last few months but always keeps a "low grade headache."   No  hx of stomach ulcer.  No diabetic meds x many months.  He is unsure why he takes the gabapentin but thinks it may be for diabetic neuropathy and gouty pain.    05/19/16 update:  Patient seen today in follow-up.  He is on primidone, 100 mg twice a day.   He had CTS by Dr. Amedeo Plenty on the L 4 weeks ago and on the R 6 weeks ago.  In regards to headache, the patient reports that they have been about the same.  He has 30 headache days per month.  They are frontal and occipital.  They can be moderate to severe but they are not that way all the time.  Remains on gabapentin, 300 mg twice per day.  Asks for refill of that.    10/16/16 update:  Pt seen today.  Was here with daughter who supplements the history.  Is here for botox for migraine but brings up other issues as well.  Daughter reveals patient taking tylenol, many daily, and states been going on since 1970's!  Also she states pt very off balance.  Recently fell off ladder.  Did have syncopal episode in recent past but because BS changes.  His BS are very out of control and has appt with endocrinology.  12/22/16 update:  Patient seen today in follow-up.  This patient is accompanied in the office by his daughter who supplements the history.Patient had Botox on 10/16/2016.  We talked about discontinuing Tylenol at that visit as well.  The patient states that he cut down on his tylenol to total of 10 tablets per day.  He has headaches 5-6 days per week.   Headaches are bilateral occiput.  States that he saw Dr. Kathyrn Sheriff yesterday and was told that he recommended pain management for injection.  States that he had MRI Cervical spine 2 weeks ago.  I reviewed this.  It  demonstrated prior cervical fusion and mild degenerative changes.  Stress is also playing a role (job stress).   I thought that I worked him in today because of increasing tremor.   Pt states that it is only "a little bothersome" but daughter states that his wife notes that he has some trouble with eating peas.   On primidone, 100 mg twice daily.  He is still on Coumadin.  Cannot take Topamax due to nephrolithiasis history.  Is already on gabapentin for headache and neuropathy.  Current/Previously tried tremor medications: no  Current medications that may exacerbate tremor:  n/a  Outside reports reviewed: historical medical records, lab reports and referral letter/letters.  Allergies  Allergen Reactions  . Sulfacetamide Sodium Swelling    throat swelling  . Adhesive [Tape] Other (See Comments)    Tears skin-- "No tape of any kind, they all tear skin right off" Tears skin-- "No tape of any kind, they all tear skin right off"  . Latex Itching    Current Outpatient Medications on File Prior to Visit  Medication Sig Dispense Refill  . atorvastatin (LIPITOR) 80 MG tablet TAKE 1 TABLET BY MOUTH AT  BEDTIME 90 tablet 3  . cyclobenzaprine (FLEXERIL) 10 MG tablet TAKE 1 TABLET (10 MG TOTAL) BY MOUTH AT BEDTIME AS NEEDED FOR MUSCLE SPASMS. 30 tablet 0  . FLUoxetine (PROZAC) 40 MG capsule TAKE 1 CAPSULE BY MOUTH  DAILY 90 capsule 1  . furosemide (LASIX) 20 MG tablet TAKE 1 TO 2 TABLETS BY  MOUTH DAILY 180 tablet 1  . gabapentin (NEURONTIN) 300 MG capsule Take 1 capsule (  300 mg total) by mouth 2 (two) times daily. 180 capsule 1  . lisinopril (PRINIVIL,ZESTRIL) 2.5 MG tablet Take 2.5 mg by mouth daily.     [name] omeprazole (PRILOSEC) 40 MG capsule TAKE 1 CAPSULE BY MOUTH  DAILY 90 capsule 1  . primidone (MYSOLINE) 50 MG tablet TAKE 2 TABLETS (100 MG TOTAL) BY MOUTH 2 (TWO) TIMES DAILY. 360 tablet 0  . silodosin (RAPAFLO) 8 MG CAPS capsule Take 8 mg by mouth daily with breakfast.     . sucralfate  (CARAFATE) 1 g tablet Take 1 g by mouth daily before breakfast.     . warfarin (COUMADIN) 2.5 MG tablet TAKE 1 TO 1.5 TABLETS BY  MOUTH DAILY OR AS DIRECTED  BY COUMADIN CLINIC 120 tablet 1  . zolpidem (AMBIEN) 10 MG tablet TAKE 1 TABLET BY MOUTH AT BEDTIME AS NEEDED 30 tablet 1   No current facility-administered medications on file prior to visit.     Past Medical History:  Diagnosis Date  . Anxiety state, unspecified   . Arthritis    hands   . Cancer ([place])    SKIN CANCER REMOVED  . Chronic atrial fibrillation (HCC)    a.fib on warfarin- Dr. Percival Spanish follows  . Depressive disorder, not elsewhere classified    takes Prozac daily  . Diverticulosis of colon (without mention of hemorrhage)   . ED (erectile dysfunction)   . GERD (gastroesophageal reflux disease)   . Hard of hearing    wears hearing aids  . History of blood clots    behind right knee and then went into left lung 18yrs ago  . History of colon polyps   . History of kidney stones   . Hx of diabetes mellitus    "no longer diabetic since gastric bypass" - no longer taking metformin"  in 2 months "problems with low blood sugar now"  . Hx of gout    but doesn't take any meds  . Inflammation of colonic mucosa    recent admission and release from [facility] 02-28-15-remains on oral antibiotic  . Peripheral vascular disease ([place])   . Pneumonia    last time about 53yrs ago  . Pulmonary embolism ([place])    over 10 yrs ago "many years ago"  . Restless legs syndrome (RLS)   . Unspecified asthma(493.90)    as a child  . Unspecified essential hypertension    hx of no longer on medication due to gastric bypass     Past Surgical History:  Procedure Laterality Date  . ARTERY REPAIR     Left forearm  . BACK SURGERY     x 3  . CARDIOVERSION     x 2 attempts-unsuccessful.  [name] CATARACT EXTRACTION, BILATERAL    . COLONOSCOPY    . CYSTOSCOPY    . EYE SURGERY     cataract bil  . FOOT SURGERY     Left foot   . HAND SURGERY      LEFT  . injections in back     x 18  . KNEE SURGERY Right    x 4  . KNEE SURGERY Left    arthroscopy  . LEG SURGERY     FOR NECROTIZING FASCITIS L LEG AND GROIN  . REPLACEMENT TOTAL KNEE Left    x 2  . SHOULDER ARTHROSCOPY    . SHOULDER SURGERY Left 2014  . TONSILLECTOMY      Social History   Socioeconomic History  . Marital status: Married  Spouse name: Not on file  . Number of children: 1  . Years of education: Not on file  . Highest education level: Not on file  Social Needs  . Financial resource strain: Not on file  . Food insecurity - worry: Not on file  . Food insecurity - inability: Not on file  . Transportation needs - medical: Not on file  . Transportation needs - non-medical: Not on file  Occupational History  . Occupation: Retired    Comment: Administrator  Tobacco Use  . Smoking status: Never Smoker  . Smokeless tobacco: Never Used  Substance and Sexual Activity  . Alcohol use: No    Alcohol/week: 0.0 oz  . Drug use: No  . Sexual activity: Yes  Other Topics Concern  . Not on file  Social History Narrative   DIET: 3 meals, F&V, some water, crystal light, No fast food   Exercise: walks treadmill 30 min      1 Caffeine drinks daily       No living will.           Family Status  Relation Name Status  . Mother  Deceased       breast/uterine cancer  . Father  Deceased       emphysema, heart disease  . Sister  Deceased       alzheimer's disease  . Brother  Deceased       struck by lightening  . Brother  Deceased       colon cancer  . Brother  Deceased       heart disease  . Brother  Alive       prostate cancer  . Daughter  Alive       healthy  . Sister  (Not Specified)  . Brother  (Not Specified)  . Brother  (Not Specified)  . Brother  (Not Specified)    Review of Systems   A complete 10 system ROS was obtained and was negative apart from what is mentioned.   Objective:   VITALS:   There were no vitals filed for this visit. Wt  Readings from Last 3 Encounters:  11/30/16 188 lb (85.3 kg)  10/30/16 190 lb (86.2 kg)  09/29/16 196 lb (88.9 kg)     Gen:  Appears stated age and in NAD. HEENT:  Normocephalic, AT.  The mucous membranes are moist. The superficial temporal arteries are without ropiness or tenderness. CV:  RRR Lungs: CTAB Neck:  No bruits.    NEUROLOGICAL:  Orientation:  The patient is alert and oriented x 3 Cranial nerves: There is good facial symmetry, but there is chronic L ptosis. Extraocular muscles are intact and visual fields are full to confrontational testing. Speech is fluent and clear. Soft palate rises symmetrically and there is no tongue deviation. Hearing is intact to conversational tone. Tone: Tone is good throughout.  Mild gegenhalten. Sensation: Sensation is intact to light touch throughout.  Hands are cold bilaterally. Coordination:  The patient has no dysdiadichokinesia or dysmetria. Motor: Strength is 5/5 in the bilateral upper and lower extremities.  Shoulder shrug is equal bilaterally.  There is no pronator drift.   Gait and Station: The patient ambulates well today  MOVEMENT EXAM: Tremor:  There is no tremor noted today.  LABS    Chemistry      Component Value Date/Time   NA 138 09/11/2016 1135   K 4.5 09/11/2016 1135   CL 107 09/11/2016 1135   CO2 26 09/11/2016  1135   BUN 23 09/11/2016 1135   CREATININE 1.60 (H) 09/11/2016 1135      Component Value Date/Time   CALCIUM 8.6 09/11/2016 1135   ALKPHOS 64 09/11/2016 1135   AST 26 09/11/2016 1135   ALT 37 09/11/2016 1135   BILITOT 0.3 09/11/2016 1135          Assessment/Plan:   1.  Essential Tremor.  -He thinks tremor is getting some worse.  Options are very limited.  He is on primidone, 100 mg twice per day.  While we can increase the medication, his INRs would need to be checked more frequently initially.  I do not think that adding a beta-blocker would be a great idea as he has struggled with low blood  pressure.  He cannot take Topamax because of his history of nephrolithiasis.  He is already on gabapentin for headache and neuropathy.  We talked about DBS surgery.  He has many other medical problems, which may prevent him from candidacy.  He and his daughter would like to try to slightly increase the primidone.  He will take 150 mg in the morning and continue 100 mg at night.  He will have his INR checked in 3 weeks and again a month after that. 2.  Gait instability  -It is believed that ET alone can cause ataxia/balance changes due to changes in the cerebellar purkinje cell/climbing fiber synaptic transmission.   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  This is getting worse and patient reports blood sugars have been out of control lately and has an upcoming appt with endocrinology.  Asked him to make a f/u appt here as well and will discuss in more detail.  Safety discussed 3.  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  Pt wanted lid lift but ophthalmology didn't want to proceed  5.  Headache  -no topamax due to hx of nephrolithiasis.   -gabapentin not providing enough relief.  Still having headache.    -Has stopped his Tylenol, so overuse headache is not likely a big thing.  -I do not think cervicogenic headache is a huge component.  -Thus far, Botox has not helped greatly, but he has not had it that long.  I told him to give it at least one more cycle.  He does think stress is playing a role (job stress).  -If one more injection of Botox does not help, may consider referring him to a headache center. 6.  He has Botox injections in December and I will see him a few months after that.  Greater than 50% of the 30-minute visit was spent in counseling with the patient and his daughter.

## 2016-12-21 DIAGNOSIS — M542 Cervicalgia: Secondary | ICD-10-CM | POA: Diagnosis not present

## 2016-12-21 DIAGNOSIS — M47812 Spondylosis without myelopathy or radiculopathy, cervical region: Secondary | ICD-10-CM | POA: Diagnosis not present

## 2016-12-22 ENCOUNTER — Ambulatory Visit (INDEPENDENT_AMBULATORY_CARE_PROVIDER_SITE_OTHER): Payer: Medicare Other | Admitting: Neurology

## 2016-12-22 ENCOUNTER — Encounter: Payer: Self-pay | Admitting: Neurology

## 2016-12-22 VITALS — BP 102/60 | HR 77 | Ht 73.0 in | Wt 194.0 lb

## 2016-12-22 DIAGNOSIS — G250 Essential tremor: Secondary | ICD-10-CM | POA: Diagnosis not present

## 2016-12-22 DIAGNOSIS — Z85828 Personal history of other malignant neoplasm of skin: Secondary | ICD-10-CM | POA: Diagnosis not present

## 2016-12-22 DIAGNOSIS — D485 Neoplasm of uncertain behavior of skin: Secondary | ICD-10-CM | POA: Diagnosis not present

## 2016-12-22 DIAGNOSIS — G43709 Chronic migraine without aura, not intractable, without status migrainosus: Secondary | ICD-10-CM

## 2016-12-22 DIAGNOSIS — C44629 Squamous cell carcinoma of skin of left upper limb, including shoulder: Secondary | ICD-10-CM | POA: Diagnosis not present

## 2016-12-22 DIAGNOSIS — IMO0002 Reserved for concepts with insufficient information to code with codable children: Secondary | ICD-10-CM

## 2016-12-22 DIAGNOSIS — L245 Irritant contact dermatitis due to other chemical products: Secondary | ICD-10-CM | POA: Diagnosis not present

## 2016-12-22 DIAGNOSIS — L570 Actinic keratosis: Secondary | ICD-10-CM | POA: Diagnosis not present

## 2016-12-22 MED ORDER — PRIMIDONE 50 MG PO TABS: ORAL_TABLET | ORAL | 1 refills | Status: DC

## 2016-12-22 NOTE — Patient Instructions (Signed)
1.  Increase primidone 50 mg - 3 tablets in the AM, 2 tablets in the PM 2.  Get your INR checked in 3 weeks and then 3-4 weeks after that

## 2017-01-06 ENCOUNTER — Ambulatory Visit (INDEPENDENT_AMBULATORY_CARE_PROVIDER_SITE_OTHER): Payer: Medicare Other | Admitting: Primary Care

## 2017-01-06 ENCOUNTER — Ambulatory Visit (INDEPENDENT_AMBULATORY_CARE_PROVIDER_SITE_OTHER)
Admission: RE | Admit: 2017-01-06 | Discharge: 2017-01-06 | Disposition: A | Discharge status: Home / Self Care | Payer: Medicare Other | Source: Ambulatory Visit | Attending: Primary Care | Admitting: Primary Care

## 2017-01-06 ENCOUNTER — Telehealth: Payer: Self-pay

## 2017-01-06 ENCOUNTER — Ambulatory Visit (INDEPENDENT_AMBULATORY_CARE_PROVIDER_SITE_OTHER): Payer: Medicare Other | Admitting: Pharmacist

## 2017-01-06 VITALS — BP 100/68 | HR 74 | Temp 98.1°F | Ht 73.0 in | Wt 193.1 lb

## 2017-01-06 DIAGNOSIS — J069 Acute upper respiratory infection, unspecified: Secondary | ICD-10-CM

## 2017-01-06 DIAGNOSIS — Z5181 Encounter for therapeutic drug level monitoring: Secondary | ICD-10-CM

## 2017-01-06 DIAGNOSIS — I4891 Unspecified atrial fibrillation: Secondary | ICD-10-CM | POA: Diagnosis not present

## 2017-01-06 DIAGNOSIS — Z7901 Long term (current) use of anticoagulants: Secondary | ICD-10-CM

## 2017-01-06 DIAGNOSIS — Z1211 Encounter for screening for malignant neoplasm of colon: Secondary | ICD-10-CM | POA: Diagnosis not present

## 2017-01-06 DIAGNOSIS — I824Y9 Acute embolism and thrombosis of unspecified deep veins of unspecified proximal lower extremity: Secondary | ICD-10-CM

## 2017-01-06 DIAGNOSIS — J9690 Respiratory failure, unspecified, unspecified whether with hypoxia or hypercapnia: Secondary | ICD-10-CM | POA: Diagnosis not present

## 2017-01-06 LAB — POCT INR: INR: 2.5

## 2017-01-06 MED ORDER — GUAIFENESIN-CODEINE 100-10 MG/5ML PO SYRP: 5.0000 mL | ORAL_SOLUTION | Freq: Every evening | ORAL | 0 refills | Status: DC | PRN

## 2017-01-06 NOTE — Patient Instructions (Addendum)
Complete xray(s) prior to leaving today. I will notify you of your results once received.  You may take the Cheratussin cough suppressant at bedtime as needed for cough and rest. Caution this medication contains codeine and will make you feel drowsy.  You can take Delsym DM or Robitussin DM during the day.  Continue the albuterol inhaler every 4 hours as needed for wheezing/shortness of breath.   Ensure you are staying hydrated with water in order to thin the mucous.   I'll be in touch soon! It was a pleasure meeting you!

## 2017-01-06 NOTE — Telephone Encounter (Signed)
See result note.  

## 2017-01-06 NOTE — Patient Instructions (Signed)
Continue on same dosage 1.5 tablets everyday except 1 tablet on Sundays, Tuesdays and Thursdays. Recheck in 6 weeks. Call if you start any new medications [phone].

## 2017-01-06 NOTE — Telephone Encounter (Signed)
Lumen at [facility] called to see if abx was supposed to be sent to pharmacy; pt is at pharmacy now; pt was seen today and office note had that Jonathan Fitz NP did not think abx needed at this point; Lumen voiced understanding and will let pt know.nothing further needed. FYI for Jonathan Fitz NP to review.

## 2017-01-06 NOTE — Progress Notes (Signed)
Subjective:    Patient ID: [name], male    DOB: 02-18-1940, 76 y.o.   MRN: [id number]  HPI  [name] is a 76 year old male with a history of COPD, asthma, pneumonia, hypertension, GERD who presents today with a chief complaint of cough. He also reports labored breathing, chest congestion, rhinorrhea, sore throat. His cough is productive with thick yellow sputum. His symptoms began two days ago with sore throat. He's been taking Mucinex, OTC cough syrup without much improvement.  He was evaluated in early August for a three day history of similar symptoms, presumed to have bronchitis, also previously taking three weeks of Augmentin for panniculectomy, placed on Doxycycline that visit. Several days later treated with Levaquin course given concerns for CAP.   Today he's requesting "something strong" for his symptoms. He knows this will turn into something "bad" and is requesting antibiotics.   Review of Systems  Constitutional: Positive for fatigue. Negative for fever.  HENT: Positive for sore throat.   Respiratory: Positive for cough. Negative for shortness of breath and wheezing.   Cardiovascular: Negative for chest pain.       Past Medical History:  Diagnosis Date  . Anxiety state, unspecified   . Arthritis    hands   . Cancer ([place])    SKIN CANCER REMOVED  . Chronic atrial fibrillation (HCC)    a.fib on warfarin- Dr. Percival Spanish follows  . Depressive disorder, not elsewhere classified    takes Prozac daily  . Diverticulosis of colon (without mention of hemorrhage)   . ED (erectile dysfunction)   . GERD (gastroesophageal reflux disease)   . Hard of hearing    wears hearing aids  . History of blood clots    behind right knee and then went into left lung 57yrs ago  . History of colon polyps   . History of kidney stones   . Hx of diabetes mellitus    "no longer diabetic since gastric bypass" - no longer taking metformin"  in 2 months "problems with low blood sugar  now"  . Hx of gout    but doesn't take any meds  . Inflammation of colonic mucosa    recent admission and release from [facility] 02-28-15-remains on oral antibiotic  . Peripheral vascular disease ([place])   . Pneumonia    last time about 23yrs ago  . Pulmonary embolism ([place])    over 10 yrs ago "many years ago"  . Restless legs syndrome (RLS)   . Unspecified asthma(493.90)    as a child  . Unspecified essential hypertension    hx of no longer on medication due to gastric bypass      Social History   Socioeconomic History  . Marital status: Married    Spouse name: Not on file  . Number of children: 1  . Years of education: Not on file  . Highest education level: Not on file  Social Needs  . Financial resource strain: Not on file  . Food insecurity - worry: Not on file  . Food insecurity - inability: Not on file  . Transportation needs - medical: Not on file  . Transportation needs - non-medical: Not on file  Occupational History  . Occupation: Retired    Comment: Administrator  Tobacco Use  . Smoking status: Never Smoker  . Smokeless tobacco: Never Used  Substance and Sexual Activity  . Alcohol use: No    Alcohol/week: 0.0 oz  . Drug use: No  .  Sexual activity: Yes  Other Topics Concern  . Not on file  Social History Narrative   DIET: 3 meals, F&V, some water, crystal light, No fast food   Exercise: walks treadmill 30 min      1 Caffeine drinks daily       No living will.           Past Surgical History:  Procedure Laterality Date  . ANTERIOR CERVICAL DECOMP/DISCECTOMY FUSION N/A 05/09/2013   Procedure: ANTERIOR CERVICAL DECOMPRESSION/DISCECTOMY FUSION CERVICAL THREE-FOUR,CERVICAL FOUR-FIVE,CERVICAL FIVE-SIX;  Surgeon: Floyce Stakes, MD;  Location: [facility];  Service: Neurosurgery;  Laterality: N/A;  . ARTERY REPAIR     Left forearm  . BACK SURGERY     x 3  . BREATH TEK H PYLORI  01/08/2011   Procedure: BREATH TEK H PYLORI;  Surgeon: Pedro Earls, MD;   Location: [facility];  Service: General;  Laterality: N/A;  . CARDIOVERSION     x 2 attempts-unsuccessful.  [name] CATARACT EXTRACTION, BILATERAL    . COLONOSCOPY    . CYSTOSCOPY    . ESOPHAGOGASTRODUODENOSCOPY (EGD) WITH PROPOFOL N/A 09/21/2014   Procedure: ESOPHAGOGASTRODUODENOSCOPY (EGD) WITH PROPOFOL;  Surgeon: Manya Silvas, MD;  Location: [facility];  Service: Endoscopy;  Laterality: N/A;  . EYE SURGERY     cataract bil  . FOOT SURGERY     Left foot   . GASTRIC ROUX-EN-Y  08/11/2011   Procedure: LAPAROSCOPIC ROUX-EN-Y GASTRIC BYPASS WITH UPPER ENDOSCOPY;  Surgeon: Pedro Earls, MD;  Location: [facility];  Service: General;  Laterality: N/A;  . HAND SURGERY     LEFT  . injections in back     x 18  . KNEE SURGERY Right    x 4  . KNEE SURGERY Left    arthroscopy  . LAPAROSCOPIC INTERNAL HERNIA REPAIR N/A 03/08/2015   Procedure: LAPAROSCOPIC INTERNAL HERNIA REPAIR ;  Surgeon: [name], MD;  Location: [facility];  Service: General;  Laterality: N/A;  . LEG SURGERY     FOR NECROTIZING FASCITIS L LEG AND GROIN  . PANNICULECTOMY N/A 08/25/2016   Procedure: PANNICULECTOMY;  Surgeon: [name], MD;  Location: [facility];  Service: General;  Laterality: N/A;  . PENILE PROSTHESIS IMPLANT N/A 07/02/2014   Procedure: PENILE PROTHESIS INFLATABLE 3 PIECE (COLOPLAST) SCROTAL APPROACH;  Surgeon: Kathie Rhodes, MD;  Location: [facility];  Service: Urology;  Laterality: N/A;  . PENILE PROSTHESIS IMPLANT N/A 11/23/2014   Procedure: EXPLORATION AND REVISION OF PENILE PROSTHESIS;  Surgeon: Kathie Rhodes, MD;  Location: [facility];  Service: Urology;  Laterality: N/A;  . REPLACEMENT TOTAL KNEE Left    x 2  . SHOULDER ARTHROSCOPY    . SHOULDER SURGERY Left 2014  . TONSILLECTOMY    . TOTAL SHOULDER ARTHROPLASTY Left 01/26/2013   Procedure: TOTAL SHOULDER ARTHROPLASTY;  Surgeon: Nita Sells, MD;  Location: [place];  Service: Orthopedics;  Laterality: Left;  Left total shoulder arthroplasty     Family History  Problem Relation Age of Onset  . Uterine cancer Mother        mets  . Breast cancer Mother   . Cancer Mother        cervical  . Emphysema Father   . Alzheimer's disease Sister   . Prostate cancer Brother   . Heart attack Brother   . Colon cancer Brother 72    Allergies  Allergen Reactions  . Sulfacetamide Sodium Swelling    throat swelling  . Adhesive [Tape] Other (See  Comments)    Tears skin-- "No tape of any kind, they all tear skin right off" Tears skin-- "No tape of any kind, they all tear skin right off"  . Latex Itching    Current Outpatient Medications on File Prior to Visit  Medication Sig Dispense Refill  . atorvastatin (LIPITOR) 80 MG tablet TAKE 1 TABLET BY MOUTH AT  BEDTIME 90 tablet 3  . FLUoxetine (PROZAC) 40 MG capsule TAKE 1 CAPSULE BY MOUTH  DAILY 90 capsule 1  . furosemide (LASIX) 20 MG tablet TAKE 1 TO 2 TABLETS BY  MOUTH DAILY 180 tablet 1  . gabapentin (NEURONTIN) 300 MG capsule Take 1 capsule (300 mg total) by mouth 2 (two) times daily. 180 capsule 1  . lisinopril (PRINIVIL,ZESTRIL) 2.5 MG tablet Take 2.5 mg by mouth daily.     [name] omeprazole (PRILOSEC) 40 MG capsule TAKE 1 CAPSULE BY MOUTH  DAILY 90 capsule 1  . primidone (MYSOLINE) 50 MG tablet 3 tablets in the AM, 2 at night 450 tablet 1  . silodosin (RAPAFLO) 8 MG CAPS capsule Take 8 mg by mouth daily with breakfast.     . warfarin (COUMADIN) 2.5 MG tablet TAKE 1 TO 1.5 TABLETS BY  MOUTH DAILY OR AS DIRECTED  BY COUMADIN CLINIC 120 tablet 1  . zolpidem (AMBIEN) 10 MG tablet TAKE 1 TABLET BY MOUTH AT BEDTIME AS NEEDED 30 tablet 1   No current facility-administered medications on file prior to visit.     BP 100/68   Pulse 74   Temp 98.1 F (36.7 C) (Oral)   Ht 6\' 1"  (1.854 m)   Wt 193 lb 1.9 oz (87.6 kg)   SpO2 98%   BMI 25.48 kg/m    Objective:   Physical Exam  Constitutional: He appears well-nourished. He does not appear ill.  HENT:  Right Ear: Tympanic membrane and  ear canal normal.  Left Ear: Tympanic membrane and ear canal normal.  Nose: No mucosal edema. Right sinus exhibits no maxillary sinus tenderness and no frontal sinus tenderness. Left sinus exhibits no maxillary sinus tenderness and no frontal sinus tenderness.  Mouth/Throat: Oropharynx is clear and moist.  Eyes: Conjunctivae are normal.  Neck: Neck supple.  Cardiovascular: Normal rate and regular rhythm.  Pulmonary/Chest: Effort normal. He has no decreased breath sounds. He has wheezes in the right upper field, the right lower field, the left upper field and the left lower field. He has no rhonchi. He has no rales.  Mild wheezing throughout  Skin: Skin is warm and dry.          Assessment & Plan:  URI Vs Early Bronchitis:  Two day history of cough, shortness of breath, wheezing. No improvement with OTC and Rx treatment. Exam today with mild wheezing, normal vitals including oxygen saturation, doesn't appear sickly or ill.  Don't recommend antibiotics at this point.  Recommended prednisone taper for which he declines.  Check chest xray to rule out infiltrates. Rx for Cheratussin provided to use HS. Robitussin or Delsym DM during the day. Continue albuterol. Return precautions provided. Will also send to PCP for input.  Sheral Flow, NP

## 2017-01-07 NOTE — Progress Notes (Signed)
Agree with care.. Pt likely needs pred taper, but declined

## 2017-01-11 DIAGNOSIS — M47812 Spondylosis without myelopathy or radiculopathy, cervical region: Secondary | ICD-10-CM | POA: Diagnosis not present

## 2017-01-11 DIAGNOSIS — M961 Postlaminectomy syndrome, not elsewhere classified: Secondary | ICD-10-CM | POA: Diagnosis not present

## 2017-01-11 DIAGNOSIS — M542 Cervicalgia: Secondary | ICD-10-CM | POA: Diagnosis not present

## 2017-01-12 ENCOUNTER — Encounter: Payer: Self-pay | Admitting: Family Medicine

## 2017-01-12 ENCOUNTER — Ambulatory Visit (INDEPENDENT_AMBULATORY_CARE_PROVIDER_SITE_OTHER): Payer: Medicare Other | Admitting: Family Medicine

## 2017-01-12 DIAGNOSIS — J441 Chronic obstructive pulmonary disease with (acute) exacerbation: Secondary | ICD-10-CM | POA: Insufficient documentation

## 2017-01-12 MED ORDER — AMOXICILLIN-POT CLAVULANATE 875-125 MG PO TABS: 1.0000 | ORAL_TABLET | Freq: Two times a day (BID) | ORAL | 0 refills | Status: DC

## 2017-01-12 NOTE — Progress Notes (Signed)
Subjective:    Patient ID: [name], male    DOB: 15-Sep-1940, 75 y.o.   MRN: [id number]  HPI   76 year old male with history of COPD, asthma... Presents for continued cough.   He was seen on 01/06/2017 by Allie Bossier for viral URI. Refused pred taper.  CXR: no active cardiopul disease.  Given albuterol and cheratussin.  Has started back Asmanex.  He started doxycycline he used in past.  He was evaluated in early August for a three day history of similar symptoms, presumed to have bronchitis, also previously taking three weeks of Augmentin for panniculectomy, placed on Doxycycline that visit. Several days later treated with Levaquin course given concerns for CAP.    Today he reports he continues to have chest tightness, productive cough yellow.. Different from his baseline. 8-9 days of symptoms.  No fever,  Right ear pain.  No face pain.  Unable to sleep at night.  Social History /Family History/Past Medical History reviewed in detail and updated in EMR if needed.  Blood pressure 122/80, pulse 65, temperature (!) 97.5 F (36.4 C), temperature source Oral, height 6\' 1"  (1.854 m), weight 187 lb (84.8 kg), SpO2 99 %.   Review of Systems  Constitutional: Negative for fatigue and fever.  HENT: Negative for ear pain.   Eyes: Negative for pain.  Respiratory: Positive for cough, shortness of breath and wheezing.   Cardiovascular: Negative for chest pain, palpitations and leg swelling.  Gastrointestinal: Negative for abdominal pain.  Genitourinary: Negative for dysuria.  Musculoskeletal: Negative for arthralgias.  Neurological: Negative for syncope, light-headedness and headaches.  Psychiatric/Behavioral: Negative for dysphoric mood.       Objective:   Physical Exam  Constitutional: Vital signs are normal. He appears well-developed and well-nourished.  Non-toxic appearance. He does not appear ill. No distress.  HENT:  Head: Normocephalic and atraumatic.  Right Ear:  Hearing, tympanic membrane, external ear and ear canal normal. No tenderness. No foreign bodies. Tympanic membrane is not retracted and not bulging.  Left Ear: Hearing, tympanic membrane, external ear and ear canal normal. No tenderness. No foreign bodies. Tympanic membrane is not retracted and not bulging.  Nose: Nose normal. No mucosal edema or rhinorrhea. Right sinus exhibits no maxillary sinus tenderness and no frontal sinus tenderness. Left sinus exhibits no maxillary sinus tenderness and no frontal sinus tenderness.  Mouth/Throat: Uvula is midline, oropharynx is clear and moist and mucous membranes are normal. Normal dentition. No dental caries. No oropharyngeal exudate or tonsillar abscesses.  Eyes: Conjunctivae, EOM and lids are normal. Pupils are equal, round, and reactive to light. Lids are everted and swept, no foreign bodies found.  Neck: Trachea normal, normal range of motion and phonation normal. Neck supple. Carotid bruit is not present. No thyroid mass and no thyromegaly present.  Cardiovascular: Normal rate, regular rhythm, S1 normal, S2 normal, normal heart sounds, intact distal pulses and normal pulses. Exam reveals no gallop.  No murmur heard. Pulmonary/Chest: Effort normal. No respiratory distress. He has decreased breath sounds. He has no wheezes. He has no rhonchi. He has no rales.   Tightness of air and prolongued exp phase  Abdominal: Soft. Normal appearance and bowel sounds are normal. There is no hepatosplenomegaly. There is no tenderness. There is no rebound, no guarding and no CVA tenderness. No hernia.  Neurological: He is alert. He has normal reflexes.  Skin: Skin is warm, dry and intact. No rash noted.  Psychiatric: He has a normal mood and affect. His  speech is normal and behavior is normal. Judgment normal.          Assessment & Plan:

## 2017-01-12 NOTE — Assessment & Plan Note (Signed)
Recommended pred taper.. Pt refused.  Will treat with antibiotics given change in sputum in COPD pt not improving in > 7-10 days.  Use albuterol continue asmanex and cough suppressant.

## 2017-01-12 NOTE — Patient Instructions (Addendum)
Continue Asmanex twice daily.  Complete course of antibiotics.  Push fluids , rest.  Start augmentin twice daily.

## 2017-01-13 ENCOUNTER — Telehealth: Payer: Self-pay | Admitting: Cardiology

## 2017-01-13 ENCOUNTER — Telehealth: Payer: Self-pay | Admitting: *Deleted

## 2017-01-13 ENCOUNTER — Other Ambulatory Visit: Payer: Self-pay | Admitting: Family Medicine

## 2017-01-13 NOTE — Telephone Encounter (Signed)
Last office visit 01/12/2017.  Last refilled 11/20/2016 for #30 with 1 refill.  Ok to refill?

## 2017-01-13 NOTE — Telephone Encounter (Signed)
Left message to call back tomorrow.   Kerin Ransom PA-C 01/13/2017 4:51 PM

## 2017-01-13 NOTE — Telephone Encounter (Signed)
   [facility] Pre-operative Risk Assessment    Request for surgical clearance:  1. What type of surgery is being performed? Cervical Facet Injection   2. When is this surgery scheduled? 02/05/17   3. Are there any medications that need to be held prior to surgery and how long? Coumadin 5 days   4. Practice name and name of physician performing surgery? [facility]   5. What is your office phone and fax number? [phone]   6. Anesthesia type (None, local, MAC, general) ?    Zafar Debrosse A Alianny Toelle 01/13/2017, 10:41 AM  _________________________________________________________________   (provider comments below)

## 2017-01-14 NOTE — Telephone Encounter (Signed)
Will ask pharmacy for instructions then discuss with pt.

## 2017-01-14 NOTE — Telephone Encounter (Signed)
Zolpidem called into [id number] - [place], [place] [address]

## 2017-01-15 NOTE — Telephone Encounter (Deleted)
Patient with diagnosis of Afib on warfarin for anticoagulation.    Procedure: cervical injection Date of procedure: 01/2817   CHADS2-VASc score of  *** (CHF, HTN, AGE, DM2, stroke/tia x 2, CAD, AGE, male)  CrCl *** Platelet count ***  Per office protocol, patient can hold *** for *** days prior to procedure.   Patient ***will/will not need bridging with Lovenox (enoxaparin) around procedure.  If not bridging, patient should restart *** on the evening of procedure or day after, at discretion of procedure MD  For orthopedic procedures please be sure to resume therapeutic (not prophylactic) dosing.

## 2017-01-15 NOTE — Telephone Encounter (Signed)
Patient with diagnosis of Afib on warfarin for anticoagulation.  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 Percival Spanish to hold anticoagulation for procedures.   Procedure: cervical injection Date of procedure: 01/2817  CHADS2-VASc score of  4 (CHF, HTN, AGE, DM2, stroke/tia x 2, CAD, AGE, male)   Per office protocol, patient can hold warfarin for 5 days prior to procedure.   Patient will not need bridging with Lovenox (enoxaparin) around procedure.

## 2017-01-20 ENCOUNTER — Other Ambulatory Visit: Payer: Self-pay | Admitting: Neurology

## 2017-01-20 NOTE — Telephone Encounter (Signed)
   Primary Cardiologist: Dr. Percival Spanish  Chart reviewed as part of pre-operative protocol coverage. Trina Ao can hold Coumadin for 5 days prior to his injection as outlined by Pharmacy. There is no indication for Lovenox bridging.   LVM for patient informing him of the Coumadin instructions and to call back if any questions or concerns.  I will route this recommendation to the requesting party via Epic fax function and remove from pre-op pool.  Please call with questions.  Erma Heritage, PA-C 01/20/2017, 2:42 PM

## 2017-01-29 ENCOUNTER — Ambulatory Visit (INDEPENDENT_AMBULATORY_CARE_PROVIDER_SITE_OTHER): Payer: Medicare Other | Admitting: Neurology

## 2017-01-29 DIAGNOSIS — IMO0002 Reserved for concepts with insufficient information to code with codable children: Secondary | ICD-10-CM

## 2017-01-29 DIAGNOSIS — S5001XA Contusion of right elbow, initial encounter: Secondary | ICD-10-CM | POA: Diagnosis not present

## 2017-01-29 DIAGNOSIS — G43709 Chronic migraine without aura, not intractable, without status migrainosus: Secondary | ICD-10-CM | POA: Diagnosis not present

## 2017-01-29 MED ORDER — ONABOTULINUMTOXINA 100 UNITS IJ SOLR
85.0000 [IU] | Freq: Once | INTRAMUSCULAR | Status: AC
  Administered 2017-01-29: 85 [IU] via INTRAMUSCULAR

## 2017-01-29 NOTE — Procedures (Signed)
[facility]   History:  Diagnosis: Migraine, chronic  Initial side: bilateral   Result History  Onset of effect: n/a  Duration of Benefit: n/a  Adverse Effects: n/a   Consent obtained from: The patient Benefits discussed included, but were not limited to decreased muscle tightness, increased joint range of motion, and decreased pain.  Risk discussed included, but were not limited pain and discomfort, bleeding, bruising, excessive weakness, venous thrombosis, muscle atrophy and dysphagia.  A copy of the patient medication guide was given to the patient which explains the blackbox warning.  Patients identity and treatment sites confirmed Yes.  .  Details of Procedure: Skin was cleaned with alcohol.  A 30 gauge, 1/2 inch needle was used.  Prior to injection, the needle plunger was aspirated to make sure the needle was not within a blood vessel.  There was no blood retrieved on aspiration.    Following is a summary of the muscles injected  And the amount of Botulinum toxin used:  Injections  Location Left  Right Units Number of sites        Corrugator 2.5/2.5 2.5/2.5 10 2  each  Frontalis 2.5/2.5 2.5/2.5 10 2  each  Temporalis 5.0/5.0 5.0/5.0 20.0 2 each  Cervical paraspinal, posterior 5.0/5.0 5.0/5.0 20 2 each  occiput 5.0/5.0 5.0/5.0 20.0 2 each  Procerus   5.0 1  TOTAL UNITS:   85     Agent: Botulinum Type A ( Onobotulinum Toxin type A ).  1 vials of Botox were used, each containing 50 units and freshly diluted with 2 mL of sterile, non-perserved saline   Total injected (Units): 85  Total wasted (Units): 15 Pt tolerated procedure well without complications.   Reinjection is anticipated in 3 months.

## 2017-02-05 DIAGNOSIS — M47812 Spondylosis without myelopathy or radiculopathy, cervical region: Secondary | ICD-10-CM | POA: Diagnosis not present

## 2017-02-08 DIAGNOSIS — G5603 Carpal tunnel syndrome, bilateral upper limbs: Secondary | ICD-10-CM | POA: Diagnosis not present

## 2017-02-08 DIAGNOSIS — M79641 Pain in right hand: Secondary | ICD-10-CM | POA: Diagnosis not present

## 2017-02-08 DIAGNOSIS — M79642 Pain in left hand: Secondary | ICD-10-CM | POA: Diagnosis not present

## 2017-02-08 DIAGNOSIS — M25531 Pain in right wrist: Secondary | ICD-10-CM | POA: Diagnosis not present

## 2017-02-08 DIAGNOSIS — G8929 Other chronic pain: Secondary | ICD-10-CM | POA: Diagnosis not present

## 2017-02-10 ENCOUNTER — Encounter: Admission: RE | Payer: Self-pay | Source: Ambulatory Visit

## 2017-02-10 ENCOUNTER — Ambulatory Visit
Admission: RE | Admit: 2017-02-10 | Payer: Medicare Other | Source: Ambulatory Visit | Admitting: Unknown Physician Specialty

## 2017-02-10 SURGERY — COLONOSCOPY WITH PROPOFOL
Anesthesia: General

## 2017-02-18 ENCOUNTER — Telehealth: Payer: Self-pay | Admitting: Family Medicine

## 2017-02-18 ENCOUNTER — Other Ambulatory Visit: Payer: Self-pay | Admitting: Cardiology

## 2017-02-18 MED ORDER — OMEPRAZOLE 40 MG PO CPDR: 40.0000 mg | DELAYED_RELEASE_CAPSULE | Freq: Every day | ORAL | 1 refills | Status: DC

## 2017-02-18 MED ORDER — FUROSEMIDE 20 MG PO TABS: 20.0000 mg | ORAL_TABLET | Freq: Every day | ORAL | 1 refills | Status: DC

## 2017-02-18 MED ORDER — ATORVASTATIN CALCIUM 80 MG PO TABS: 80.0000 mg | ORAL_TABLET | Freq: Every day | ORAL | 3 refills | Status: AC

## 2017-02-18 MED ORDER — WARFARIN SODIUM 2.5 MG PO TABS: ORAL_TABLET | ORAL | 0 refills | Status: DC

## 2017-02-18 MED ORDER — FLUOXETINE HCL 40 MG PO CAPS: 40.0000 mg | ORAL_CAPSULE | Freq: Every day | ORAL | 1 refills | Status: DC

## 2017-02-18 NOTE — Telephone Encounter (Signed)
Unable to reach Mr or Mrs Pinzon by phone and spoke with Adonis Brook ([place] signed) Adonis Brook is not sure if new acct has been set up with [facility] but she will ck with her parents and if not they will call and set up acct. Advised sending in refills per protocol as requested. Adonis Brook voiced understanding.

## 2017-02-18 NOTE — Telephone Encounter (Signed)
Copied from [place] [phone]. Topic: Quick Communication - Rx Refill/Question >> Feb 18, 2017  1:44 PM Scherrie Gerlach wrote: Medication: omeprazole (PRILOSEC) 40 MG capsule FLUoxetine (PRO ZAC) 40 MG capsule Pt has a new Secondary school teacher and they will not transfer these scripts   Preferred Pharmacy (with phone number or street name): [facility] ([place], [place] [phone] (Phone) [phone] (Fax)

## 2017-02-18 NOTE — Telephone Encounter (Signed)
Pt requesting transfer of these meds,  omeprazole and FLUoxetine to a new mail order pharmacy.

## 2017-02-18 NOTE — Telephone Encounter (Signed)
New message   *STAT* If patient is at the pharmacy, call can be transferred to refill team.   1. Which medications need to be refilled? (please list name of each medication and dose if known) Furosemide 20mg , Warfarin 2.5mg , Atorvastatin 80mg   2. Which pharmacy/location (including street and city if local pharmacy) is medication to be sent to? [facility] [phone] fax [phone]  3. Do they need a 30 day or 90 day supply? [place]

## 2017-02-19 ENCOUNTER — Ambulatory Visit (INDEPENDENT_AMBULATORY_CARE_PROVIDER_SITE_OTHER): Payer: Medicare Other | Admitting: Pharmacist

## 2017-02-19 DIAGNOSIS — I824Y9 Acute embolism and thrombosis of unspecified deep veins of unspecified proximal lower extremity: Secondary | ICD-10-CM

## 2017-02-19 DIAGNOSIS — I4891 Unspecified atrial fibrillation: Secondary | ICD-10-CM

## 2017-02-19 DIAGNOSIS — H04123 Dry eye syndrome of bilateral lacrimal glands: Secondary | ICD-10-CM | POA: Diagnosis not present

## 2017-02-19 DIAGNOSIS — H33301 Unspecified retinal break, right eye: Secondary | ICD-10-CM | POA: Diagnosis not present

## 2017-02-19 DIAGNOSIS — M1711 Unilateral primary osteoarthritis, right knee: Secondary | ICD-10-CM | POA: Diagnosis not present

## 2017-02-19 DIAGNOSIS — Z5181 Encounter for therapeutic drug level monitoring: Secondary | ICD-10-CM | POA: Diagnosis not present

## 2017-02-19 DIAGNOSIS — Z7901 Long term (current) use of anticoagulants: Secondary | ICD-10-CM

## 2017-02-19 LAB — POCT INR: INR: 1.7

## 2017-02-19 NOTE — Patient Instructions (Signed)
Description   Take 2 tablets for the next 3 days, then continue on same dosage 1.5 tablets everyday except 1 tablet on Sundays, Tuesdays and Thursdays. Recheck in 2 weeks. Call if you start any new medications [phone].

## 2017-02-22 DIAGNOSIS — Z85828 Personal history of other malignant neoplasm of skin: Secondary | ICD-10-CM | POA: Diagnosis not present

## 2017-02-22 DIAGNOSIS — C44622 Squamous cell carcinoma of skin of right upper limb, including shoulder: Secondary | ICD-10-CM | POA: Diagnosis not present

## 2017-02-22 DIAGNOSIS — L570 Actinic keratosis: Secondary | ICD-10-CM | POA: Diagnosis not present

## 2017-02-22 DIAGNOSIS — D485 Neoplasm of uncertain behavior of skin: Secondary | ICD-10-CM | POA: Diagnosis not present

## 2017-02-22 DIAGNOSIS — G603 Idiopathic progressive neuropathy: Secondary | ICD-10-CM | POA: Diagnosis not present

## 2017-02-24 ENCOUNTER — Other Ambulatory Visit: Payer: Self-pay | Admitting: Neurology

## 2017-03-05 ENCOUNTER — Ambulatory Visit (INDEPENDENT_AMBULATORY_CARE_PROVIDER_SITE_OTHER): Payer: Medicare Other

## 2017-03-05 DIAGNOSIS — Z5181 Encounter for therapeutic drug level monitoring: Secondary | ICD-10-CM

## 2017-03-05 DIAGNOSIS — I824Y9 Acute embolism and thrombosis of unspecified deep veins of unspecified proximal lower extremity: Secondary | ICD-10-CM | POA: Diagnosis not present

## 2017-03-05 DIAGNOSIS — Z7901 Long term (current) use of anticoagulants: Secondary | ICD-10-CM | POA: Diagnosis not present

## 2017-03-05 DIAGNOSIS — I4891 Unspecified atrial fibrillation: Secondary | ICD-10-CM | POA: Diagnosis not present

## 2017-03-05 LAB — POCT INR: INR: 1.7

## 2017-03-05 NOTE — Patient Instructions (Signed)
Description   Take 2 tablets for the next 2 days, then start taking 1.5 tablets everyday except 1 tablet on Sundays and Thursdays. Recheck in 2 weeks. Call if you start any new medications [phone].

## 2017-03-08 ENCOUNTER — Telehealth: Payer: Self-pay | Admitting: *Deleted

## 2017-03-08 ENCOUNTER — Other Ambulatory Visit: Payer: Self-pay | Admitting: Neurology

## 2017-03-08 MED ORDER — PRIMIDONE 50 MG PO TABS: ORAL_TABLET | ORAL | 1 refills | Status: DC

## 2017-03-08 NOTE — Telephone Encounter (Signed)
   [facility] Pre-operative Risk Assessment    Request for surgical clearance:  1. What type of surgery is being performed? RIGHT C2-3 FACET   2. When is this surgery scheduled? 03-19-17   3. What type of clearance is required (medical clearance vs. Pharmacy clearance to hold med vs. Both)? PHARMACY CLEARANCE  4. Are there any medications that need to be held prior to surgery and how long?WARFARIN FOR 5 DAYS PRIOR   5. Practice name and name of physician performing surgery? DR Marlaine Hind   6. What is your office phone and fax number? [phone]  7. Anesthesia type (None, local, MAC, general) ? UNKNOWN   Fredia Beets 03/08/2017, 5:33 PM  _________________________________________________________________   (provider comments below)

## 2017-03-09 ENCOUNTER — Ambulatory Visit (INDEPENDENT_AMBULATORY_CARE_PROVIDER_SITE_OTHER): Payer: Medicare Other | Admitting: Family Medicine

## 2017-03-09 ENCOUNTER — Other Ambulatory Visit: Payer: Self-pay

## 2017-03-09 ENCOUNTER — Ambulatory Visit: Payer: Medicare Other | Admitting: Neurology

## 2017-03-09 ENCOUNTER — Encounter: Payer: Self-pay | Admitting: Family Medicine

## 2017-03-09 DIAGNOSIS — J441 Chronic obstructive pulmonary disease with (acute) exacerbation: Secondary | ICD-10-CM

## 2017-03-09 MED ORDER — AZITHROMYCIN 250 MG PO TABS: ORAL_TABLET | ORAL | 0 refills | Status: DC

## 2017-03-09 NOTE — Telephone Encounter (Signed)
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  Previously cleared to hold.   Procedure: warfarin Date of procedure: 03/19/17  CHADS2-VASc score of  4 (CHF, HTN, AGE, DM2, stroke/tia x 2, CAD, AGE, male)  Per office protocol, patient can hold  Warfarin for 5 days prior to procedure.   Patient will not need bridging with Lovenox (enoxaparin) around procedure.

## 2017-03-09 NOTE — Progress Notes (Signed)
   Subjective:    Patient ID: [name], male    DOB: Jan 30, 1941, 77 y.o.   MRN: [id number]  Cough  This is a new problem. The current episode started in the past 7 days. The cough is productive of sputum (white sputum). Associated symptoms include headaches, nasal congestion, postnasal drip, rhinorrhea, a sore throat, shortness of breath and wheezing. Pertinent negatives include no ear pain or fever. Risk factors for lung disease include smoking/tobacco exposure. He has tried a beta-agonist inhaler ( using a couple times a day.) for the symptoms. The treatment provided mild relief. His past medical history is significant for COPD.  Wheezing   Associated symptoms include coughing, headaches, rhinorrhea, shortness of breath and a sore throat. Pertinent negatives include no ear pain or fever. His past medical history is significant for COPD.     Treated for COPD exacerbation in early December.. Resolved completely  Using asmanex twice daily Review of Systems  Constitutional: Negative for fever.  HENT: Positive for postnasal drip, rhinorrhea and sore throat. Negative for ear pain.   Respiratory: Positive for cough, shortness of breath and wheezing.   Neurological: Positive for headaches.       Objective:   Physical Exam  Constitutional: Vital signs are normal. He appears well-developed and well-nourished.  Non-toxic appearance. He does not appear ill. No distress.  HENT:  Head: Normocephalic and atraumatic.  Right Ear: Hearing, tympanic membrane, external ear and ear canal normal. No tenderness. No foreign bodies. Tympanic membrane is not retracted and not bulging.  Left Ear: Hearing, tympanic membrane, external ear and ear canal normal. No tenderness. No foreign bodies. Tympanic membrane is not retracted and not bulging.  Nose: Nose normal. No mucosal edema or rhinorrhea. Right sinus exhibits no maxillary sinus tenderness and no frontal sinus tenderness. Left sinus exhibits no  maxillary sinus tenderness and no frontal sinus tenderness.  Mouth/Throat: Uvula is midline, oropharynx is clear and moist and mucous membranes are normal. Normal dentition. No dental caries. No oropharyngeal exudate or tonsillar abscesses.  Eyes: Conjunctivae, EOM and lids are normal. Pupils are equal, round, and reactive to light. Lids are everted and swept, no foreign bodies found.  Neck: Trachea normal, normal range of motion and phonation normal. Neck supple. Carotid bruit is not present. No thyroid mass and no thyromegaly present.  Cardiovascular: Normal rate, regular rhythm, S1 normal, S2 normal, normal heart sounds, intact distal pulses and normal pulses. Exam reveals no gallop.  No murmur heard. Pulmonary/Chest: Effort normal and breath sounds normal. No respiratory distress. He has no wheezes. He has no rhonchi. He has no rales.  Abdominal: Soft. Normal appearance and bowel sounds are normal. There is no hepatosplenomegaly. There is no tenderness. There is no rebound, no guarding and no CVA tenderness. No hernia.  Neurological: He is alert. He has normal reflexes.  Skin: Skin is warm, dry and intact. No rash noted.  Psychiatric: He has a normal mood and affect. His speech is normal and behavior is normal. Judgment normal.          Assessment & Plan:

## 2017-03-09 NOTE — Patient Instructions (Addendum)
Continue asmanex twice daily. Use two inhalation at a time.  Add mucinex.  If not improving in next 2-3 days.[name] prescription for an antibiotic. If you fill prescription for antibiotic.[name] Have INR checked early while on.

## 2017-03-09 NOTE — Assessment & Plan Note (Signed)
Possible bacterial infection , but change in mucus.. If not improving.[name] rx for  Antibitoics.  No current indication for prednisone taper.   Continue asmanex as a controller.

## 2017-03-10 ENCOUNTER — Telehealth: Payer: Self-pay

## 2017-03-10 NOTE — Telephone Encounter (Signed)
I s/w CMA for Dr. Brien Few and advised clearance letter has been faxed via Epic. CMA verbalized understanding to this.

## 2017-03-10 NOTE — Telephone Encounter (Signed)
  Pre-operative Risk Assessment - Provider Statement    Patient ID:  [name], DOB: [birth year], MRN: [id number]   [name]'s chart has been reviewed for pre-operative risk assessment.  The patient needs cervical spine injection.  Request is only for anticoagulation recommendations.  This is been addressed by our CV [facility] clinic.  He may hold warfarin for 5 days prior to procedure.  I spoke to his wife today who is listed on his DPR.  Letter has been faxed to the requesting surgeon.  Please contact the surgeon's office to ensure that it has been received.  Signed,  Richardson Dopp, PA-C  03/10/2017 3:56 PM

## 2017-03-10 NOTE — Telephone Encounter (Signed)
Pt's wife called states pt has 2 upcoming procedures.  First procedure is on 03/19/17 neck injections Dr Brien Few.  Telephone note in chart with clearance to hold Coumadin x 5 days prior to procedure.  Wife states pt has already had 1 of these injections which he didn't hold his Coumadin prior to, so she is questioning why he needs to hold his Coumadin prior to this injection.  Advised we would call their office to clarify and make sure he does in fact need to hold his Coumadin.  Pt is also scheduled for a colonoscopy on 03/29/17, pt's wife aware to hold Coumadin 5 days prior, she is very familiar with pre procedure Coumadin holds.  Pt is scheduled for INR check on 03/29/17, cancelled that appt as pt will be post colonoscopy and off Coumadin at that time, rescheduled appt for 04/05/17 at 9:15am.   Called spoke with Dr Lauris Poag nurse, pt is having the same injection he had previously which he did not hold his Coumadin for prior to injection.  Requested she speak with MD,

## 2017-03-17 ENCOUNTER — Other Ambulatory Visit: Payer: Self-pay | Admitting: Family Medicine

## 2017-03-18 NOTE — Telephone Encounter (Signed)
Last office visit 03/09/2017.  Last refilled 01/14/2017 for #30 with 1 refill.  Ok to refill?

## 2017-03-26 ENCOUNTER — Encounter: Payer: Self-pay | Admitting: *Deleted

## 2017-03-26 DIAGNOSIS — M25561 Pain in right knee: Secondary | ICD-10-CM | POA: Diagnosis not present

## 2017-03-26 DIAGNOSIS — M47812 Spondylosis without myelopathy or radiculopathy, cervical region: Secondary | ICD-10-CM | POA: Diagnosis not present

## 2017-03-29 ENCOUNTER — Ambulatory Visit
Admission: RE | Admit: 2017-03-29 | Payer: Medicare Other | Source: Ambulatory Visit | Admitting: Unknown Physician Specialty

## 2017-03-29 ENCOUNTER — Encounter: Admission: RE | Payer: Self-pay | Source: Ambulatory Visit

## 2017-03-29 DIAGNOSIS — M25561 Pain in right knee: Secondary | ICD-10-CM | POA: Diagnosis not present

## 2017-03-29 HISTORY — DX: Obesity, unspecified: E66.9

## 2017-03-29 SURGERY — COLONOSCOPY WITH PROPOFOL
Anesthesia: General

## 2017-04-05 ENCOUNTER — Ambulatory Visit (INDEPENDENT_AMBULATORY_CARE_PROVIDER_SITE_OTHER): Payer: Medicare Other | Admitting: Pharmacist

## 2017-04-05 ENCOUNTER — Other Ambulatory Visit: Payer: Self-pay | Admitting: Orthopaedic Surgery

## 2017-04-05 ENCOUNTER — Telehealth: Payer: Self-pay | Admitting: *Deleted

## 2017-04-05 DIAGNOSIS — Z5181 Encounter for therapeutic drug level monitoring: Secondary | ICD-10-CM | POA: Diagnosis not present

## 2017-04-05 DIAGNOSIS — I4891 Unspecified atrial fibrillation: Secondary | ICD-10-CM | POA: Diagnosis not present

## 2017-04-05 DIAGNOSIS — I824Y9 Acute embolism and thrombosis of unspecified deep veins of unspecified proximal lower extremity: Secondary | ICD-10-CM | POA: Diagnosis not present

## 2017-04-05 DIAGNOSIS — Z7901 Long term (current) use of anticoagulants: Secondary | ICD-10-CM | POA: Diagnosis not present

## 2017-04-05 LAB — POCT INR: INR: 1.6

## 2017-04-05 NOTE — Patient Instructions (Signed)
Take 2 tablets for Monday, Tuesday, Wednesday, then continue 1.5 tablets everyday except 1 tablet on Sundays and Thursdays. Recheck in 1 weeks. Call if you start any new medications [phone].

## 2017-04-05 NOTE — Telephone Encounter (Signed)
   [facility] Pre-operative Risk Assessment    Request for surgical clearance:  1. What type of surgery is being performed? RIGHT TKA   2. When is this surgery scheduled? 04/27/17   3. What type of clearance is required (medical clearance vs. Pharmacy clearance to hold med vs. Both)? BOTH  4. Are there any medications that need to be held prior to surgery and how long?WARFARIN 5 DAYS PRIOR TO PROCEDURE   5. Practice name and name of physician performing surgery? Melrose Nakayama MD   6. What is your office phone and fax number? [facility] PH=[phone]  FAX=[phone]  7. Anesthesia type (None, local, MAC, general) ? SPINAL   Fredia Beets 04/05/2017, 1:14 PM  _________________________________________________________________   (provider comments below)

## 2017-04-05 NOTE — Telephone Encounter (Signed)
   Primary Cardiologist: Minus Breeding, MD  Chart reviewed as part of pre-operative protocol coverage. Patient was contacted 04/05/2017 in reference to pre-operative risk assessment for pending surgery as outlined below.  [name] was last seen in October of 2018 by Dr. Percival Spanish.  Since that day, [name] has done well and denies chest pain/shortness of breath/syncope. He is able to complete 4 mets of activity.  Therefore, based on ACC/AHA guidelines, the patient would be at acceptable risk for the planned procedure without further cardiovascular testing.    I will route this recommendation to the requesting party via Epic fax function and remove from pre-op pool.  Please call with questions.  Truitt Merle, NP 04/05/2017, 3:32 PM

## 2017-04-05 NOTE — Telephone Encounter (Signed)
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 INR range higher at 2.5-3.5 however pt has been cleared for multiple previous procedures to hold warfarin without Lovenox bridge (previous clearance per Dr Percival Spanish 02/28/16 note). Ok to hold warfarin for 5 days prior to upcoming right TKA on 3/19 without Lovenox bridge per previous MD clearance.

## 2017-04-12 ENCOUNTER — Ambulatory Visit (INDEPENDENT_AMBULATORY_CARE_PROVIDER_SITE_OTHER): Payer: Medicare Other | Admitting: *Deleted

## 2017-04-12 DIAGNOSIS — Z5181 Encounter for therapeutic drug level monitoring: Secondary | ICD-10-CM

## 2017-04-12 DIAGNOSIS — Z7901 Long term (current) use of anticoagulants: Secondary | ICD-10-CM

## 2017-04-12 DIAGNOSIS — I4891 Unspecified atrial fibrillation: Secondary | ICD-10-CM

## 2017-04-12 DIAGNOSIS — I824Y9 Acute embolism and thrombosis of unspecified deep veins of unspecified proximal lower extremity: Secondary | ICD-10-CM | POA: Diagnosis not present

## 2017-04-12 LAB — POCT INR: INR: 2.3

## 2017-04-12 NOTE — Patient Instructions (Signed)
Description   Today take 2 tablets, then continue 1.5 tablets everyday except 1 tablet on Sundays and Thursdays. Take your last dose on 04/21/17 for surgery on 04/27/17. Recheck in 3 weeks. Call if you start any new medications [phone].

## 2017-04-14 ENCOUNTER — Other Ambulatory Visit: Payer: Self-pay | Admitting: Orthopaedic Surgery

## 2017-04-14 NOTE — Care Plan (Signed)
Spoke with patient and his wife prior to surgery. He will discharge to home with her and HHPT provided by [facility]. I have ordered his rolling walker and bedside commode from [place] and it will be delivered to the hospital prior to his discharge. He will follow up with Dr. Rhona Raider in the office on 05/07/17 @ 900 and transition over to [place] after that appointment at the [address] location.  Will follow.

## 2017-04-15 NOTE — Pre-Procedure Instructions (Signed)
[name]  04/15/2017      [id number] [name], [address] [place] [address] Phone: [phone] Fax: [phone]  [facility] - [place], [place] [address], [address] [address] Phone: [phone] Fax: [phone]    Your procedure is scheduled on Tuesday March 19.  Report to [facility] Admitting at 8:15 A.M.  Call this number if you have problems the morning of surgery:  [phone]   Remember:  Do not eat food or drink liquids after midnight.  Take these medicines the morning of surgery with A SIP OF WATER:   Fluoxetine (prozac) Gabapentin (neurontin) Lansoprazole (prevacid) Primidone (mysolien) Rapaflo (silodosin) tamsulosin (flomax)  7 days prior to surgery STOP taking any Aspirin(unless otherwise instructed by your surgeon), Aleve, Naproxen, Ibuprofen, Motrin, Advil, Goody's, BC's, all herbal medications, fish oil, and all vitamins  **FOLLOW your surgeon's instructions on stopping Coumadin (warfarin). Hold 5 days prior to surgery**     Do not wear jewelry, make-up or nail polish.  Do not wear lotions, powders, or perfumes, or deodorant.  Do not shave 48 hours prior to surgery.  Men may shave face and neck.  Do not bring valuables to the hospital.  [facility] is not responsible for any belongings or valuables.  Contacts, dentures or bridgework may not be worn into surgery.  Leave your suitcase in the car.  After surgery it may be brought to your room.  For patients admitted to the hospital, discharge time will be determined by your treatment team.  Patients discharged the day of surgery will not be allowed to drive home.   Special instructions:    [name]- Preparing For Surgery  Before surgery, you can play an important role. Because skin is not sterile, your skin needs to be as free of germs as possible. You can reduce  the number of germs on your skin by washing with CHG (chlorahexidine gluconate) Soap before surgery.  CHG is an antiseptic cleaner which kills germs and bonds with the skin to continue killing germs even after washing.  Please do not use if you have an allergy to CHG or antibacterial soaps. If your skin becomes reddened/irritated stop using the CHG.  Do not shave (including legs and underarms) for at least 48 hours prior to first CHG shower. It is OK to shave your face.  Please follow these instructions carefully.   1. Shower the NIGHT BEFORE SURGERY and the MORNING OF SURGERY with CHG.   2. If you chose to wash your hair, wash your hair first as usual with your normal shampoo.  3. After you shampoo, rinse your hair and body thoroughly to remove the shampoo.  4. Use CHG as you would any other liquid soap. You can apply CHG directly to the skin and wash gently with a scrungie or a clean washcloth.   5. Apply the CHG Soap to your body ONLY FROM THE NECK DOWN.  Do not use on open wounds or open sores. Avoid contact with your eyes, ears, mouth and genitals (private parts). Wash Face and genitals (private parts)  with your normal soap.  6. Wash thoroughly, paying special attention to the area where your surgery will be performed.  7. Thoroughly rinse your body with warm water from the neck down.  8. DO NOT shower/wash with your normal soap after using and rinsing off the  CHG Soap.  9. Pat yourself dry with a CLEAN TOWEL.  10. Wear CLEAN PAJAMAS to bed the night before surgery, wear comfortable clothes the morning of surgery  11. Place CLEAN SHEETS on your bed the night of your first shower and DO NOT SLEEP WITH PETS.    Day of Surgery: Do not apply any deodorants/lotions. Please wear clean clothes to the hospital/surgery center.      Please read over the following fact sheets that you were given. Coughing and Deep Breathing, Total Joint Packet, MRSA Information and Surgical Site  Infection Prevention

## 2017-04-16 ENCOUNTER — Encounter (HOSPITAL_COMMUNITY): Payer: Self-pay

## 2017-04-16 ENCOUNTER — Other Ambulatory Visit: Payer: Self-pay

## 2017-04-16 ENCOUNTER — Other Ambulatory Visit: Payer: Self-pay | Admitting: Neurology

## 2017-04-16 ENCOUNTER — Encounter (HOSPITAL_COMMUNITY)
Admission: RE | Admit: 2017-04-16 | Discharge: 2017-04-16 | Disposition: A | Discharge status: Home / Self Care | Payer: Medicare Other | Source: Ambulatory Visit | Attending: Orthopaedic Surgery | Admitting: Orthopaedic Surgery

## 2017-04-16 DIAGNOSIS — Z01812 Encounter for preprocedural laboratory examination: Secondary | ICD-10-CM | POA: Insufficient documentation

## 2017-04-16 HISTORY — DX: Fibromyalgia: M79.7

## 2017-04-16 HISTORY — DX: Headache: R51

## 2017-04-16 HISTORY — DX: Headache, unspecified: R51.9

## 2017-04-16 HISTORY — DX: Unspecified asthma, uncomplicated: J45.909

## 2017-04-16 LAB — URINALYSIS, ROUTINE W REFLEX MICROSCOPIC
Bilirubin Urine: NEGATIVE
GLUCOSE, UA: NEGATIVE mg/dL
HGB URINE DIPSTICK: NEGATIVE
Ketones, ur: NEGATIVE mg/dL
Leukocytes, UA: NEGATIVE
Nitrite: NEGATIVE
Protein, ur: NEGATIVE mg/dL
SPECIFIC GRAVITY, URINE: 1.005 (ref 1.005–1.030)
pH: 5 (ref 5.0–8.0)

## 2017-04-16 LAB — NO BLOOD PRODUCTS

## 2017-04-16 LAB — CBC WITH DIFFERENTIAL/PLATELET
Basophils Absolute: 0.1 10*3/uL (ref 0.0–0.1)
Basophils Relative: 2 %
EOS PCT: 5 %
Eosinophils Absolute: 0.3 10*3/uL (ref 0.0–0.7)
HEMATOCRIT: 40.2 % (ref 39.0–52.0)
Hemoglobin: 12.7 g/dL — ABNORMAL LOW (ref 13.0–17.0)
LYMPHS ABS: 1.5 10*3/uL (ref 0.7–4.0)
LYMPHS PCT: 26 %
MCH: 30.2 pg (ref 26.0–34.0)
MCHC: 31.6 g/dL (ref 30.0–36.0)
MCV: 95.7 fL (ref 78.0–100.0)
Monocytes Absolute: 0.7 10*3/uL (ref 0.1–1.0)
Monocytes Relative: 12 %
NEUTROS ABS: 3.3 10*3/uL (ref 1.7–7.7)
Neutrophils Relative %: 55 %
PLATELETS: 211 10*3/uL (ref 150–400)
RBC: 4.2 MIL/uL — AB (ref 4.22–5.81)
RDW: 14.3 % (ref 11.5–15.5)
WBC: 5.8 10*3/uL (ref 4.0–10.5)

## 2017-04-16 LAB — SURGICAL PCR SCREEN
MRSA, PCR: POSITIVE — AB
STAPHYLOCOCCUS AUREUS: POSITIVE — AB

## 2017-04-16 LAB — BASIC METABOLIC PANEL
ANION GAP: 8 (ref 5–15)
BUN: 17 mg/dL (ref 6–20)
CHLORIDE: 106 mmol/L (ref 101–111)
CO2: 24 mmol/L (ref 22–32)
Calcium: 8.6 mg/dL — ABNORMAL LOW (ref 8.9–10.3)
Creatinine, Ser: 1.5 mg/dL — ABNORMAL HIGH (ref 0.61–1.24)
GFR calc Af Amer: 51 mL/min — ABNORMAL LOW (ref >60)
GFR calc non Af Amer: 44 mL/min — ABNORMAL LOW (ref >60)
GLUCOSE: 136 mg/dL — AB (ref 65–99)
POTASSIUM: 4.8 mmol/L (ref 3.5–5.1)
SODIUM: 138 mmol/L (ref 135–145)

## 2017-04-16 MED ORDER — GABAPENTIN 300 MG PO CAPS: 300.0000 mg | ORAL_CAPSULE | Freq: Two times a day (BID) | ORAL | 0 refills | Status: DC

## 2017-04-16 NOTE — Progress Notes (Signed)
[facility] [address]. Message left on answering machine for Mr Frampton to pick up script.

## 2017-04-16 NOTE — Progress Notes (Addendum)
PCP is Dr. Eliezer Lofts Cardiologist is Dr. Minus Breeding Denies chest pain, cough, or fever States he had a card cath years ago 1994 Echo noted 08-11-11 States stress test was years ago. Reports last coumadin dose will be 04-22-17 Order placed for pt and ptt on day of surgery due to he has not stopped taking coumadin yet. Reports he no longer takes meds for Diabetes after he lost 130 pounds  Still check his CBG's at times usually runs in the 130's  Refuses blood transfusion- refusal faxed to blood bank and Dr Tollie Pizza office called and informed Note in epic from Truitt Merle "acceptalble risk" noted

## 2017-04-17 LAB — HEMOGLOBIN A1C
HEMOGLOBIN A1C: 7.9 % — AB (ref 4.8–5.6)
Mean Plasma Glucose: 180 mg/dL

## 2017-04-19 ENCOUNTER — Telehealth: Payer: Self-pay | Admitting: Neurology

## 2017-04-19 DIAGNOSIS — Z79899 Other long term (current) drug therapy: Secondary | ICD-10-CM | POA: Diagnosis not present

## 2017-04-19 DIAGNOSIS — Z9884 Bariatric surgery status: Secondary | ICD-10-CM | POA: Diagnosis not present

## 2017-04-19 DIAGNOSIS — E1142 Type 2 diabetes mellitus with diabetic polyneuropathy: Secondary | ICD-10-CM | POA: Diagnosis not present

## 2017-04-19 NOTE — Progress Notes (Signed)
Anesthesia Chart Review:  Pt is a 77 year old male scheduled for R total knee arthroplasty on 04/27/2017 with Melrose Nakayama, MD  - PCP is Eliezer Lofts, MD - Cardiologist is Minus Breeding, MD. Last office visit 11/30/16.  Cleared for surgery 04/05/17 by Truitt Merle, NP  PMH includes: Atrial fibrillation, HTN, DM (not on meds), asthma, PE, GERD.  Never smoker.  BMI 26. - S/p panniculectomy 08/25/16. S/p internal hernia repair 03/08/15. S/p penile prosthesis 07/02/14. S/p ACDF 05/09/13. S/p L shoulder arthroplasty 01/26/13. 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  Pt to hold coumadin 5 days before surgery.  BP 122/76   Pulse 77   Temp 36.7 C   Resp 20   Ht 6\' 1"  (1.854 m)   Wt 198 lb 1.6 oz (89.9 kg)   SpO2 100%   BMI 26.14 kg/m   Preoperative labs reviewed.   - HbA1c 7.9, glucose 136. I routed A1c result to PCP.  - PT/PTT will be obtained day of surgery  CXR 01/06/17: No active cardiopulmonary disease.  EKG 08/21/16: Atrial fibrillation (68 bpm)  Echo 08/11/11:  - Left ventricle: The cavity size was normal. Wall thicknesswas increased in a pattern of mild LVH. Systolic functionwas normal. The estimated ejection fraction was in therange of 55% to 60%. Wall motion was normal; there were noregional wall motion abnormalities. - Left atrium: The atrium was mildly to moderately dilated.  If labs acceptable day of surgery, I anticipate pt can proceed with surgery as scheduled.   Willeen Cass, FNP-BC [facility] Short Stay Surgical Center/Anesthesiology Phone: [phone] 04/19/2017 3:02 PM

## 2017-04-19 NOTE — Telephone Encounter (Signed)
Pt can not make the March 22nd botox appointment due to surgery and wants to reschedule, does not want to wait until June for the next botox day, please advise

## 2017-04-20 NOTE — Telephone Encounter (Signed)
Unfortunately, I have nothing else to offer. He can be put on the June Botox day and then on the cancellation list.

## 2017-04-21 NOTE — H&P (Signed)
TOTAL KNEE ADMISSION H&P  Patient is being admitted for right total knee arthroplasty.  Subjective:  Chief Complaint:right knee pain.  HPI: [name], 77 y.o. male, has a history of pain and functional disability in the right knee due to arthritis and has failed non-surgical conservative treatments for greater than 12 weeks to includeNSAID's and/or analgesics, corticosteriod injections, viscosupplementation injections, flexibility and strengthening excercises, supervised PT with diminished ADL's post treatment, use of assistive devices, weight reduction as appropriate and activity modification.  Onset of symptoms was gradual, starting 5 years ago with gradually worsening course since that time. The patient noted prior procedures on the knee to include  arthroscopy on the right knee(s).  Patient currently rates pain in the right knee(s) at 10 out of 10 with activity. Patient has night pain, worsening of pain with activity and weight bearing, pain that interferes with activities of daily living, crepitus and joint swelling.  Patient has evidence of subchondral cysts, subchondral sclerosis, periarticular osteophytes and joint space narrowing by imaging studies. There is no active infection.  Patient Active Problem List   Diagnosis Date Noted  . COPD exacerbation ([place]) 01/12/2017  . Acute neck pain 10/30/2016  . Anticoagulant long-term use 09/14/2016  . S/P panniculectomy 08/25/2016  . Persistent cough for 3 weeks or longer 02/28/2016  . Internal hernia 03/08/2015  . GERD (gastroesophageal reflux disease) 08/03/2014  . Acid reflux 08/03/2014  . ED (erectile dysfunction) of organic origin 07/02/2014  . Type 2 diabetes mellitus with neurologic complication ([place]) 31/51/7616  . Counseling regarding end of life decision making 01/23/2014  . Dysphagia, pharyngoesophageal phase 08/30/2013  . Benign essential tremor 08/30/2013  . COPD, mild ([place]) 07/13/2013  . Postoperative pain, acute,  shoulder 05/13/2013  . Arthralgia of shoulder 05/13/2013  . Cervical spinal stenosis 05/09/2013  . Encounter for therapeutic drug monitoring 04/26/2013  . Peripheral autonomic neuropathy due to diabetes mellitus ([place]) 12/11/2011  . Hereditary and idiopathic neuropathy 12/11/2011  . 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 History of surgical procedure 09/03/2011  . Atrial fibrillation, permanent ([place]) 08/12/2011  . Bacterial overgrowth syndrome 10/28/2010  . Nonsurgical dumping syndrome 10/14/2010  . 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 Diverticulitis of colon 09/02/2010  . Acute venous embolism and thrombosis of deep vessels of proximal lower extremity ([place]) 08/01/2010  . Vascular disorder of lower extremity 08/01/2010  . Osteoporosis 11/26/2009  . Anemia 09/02/2009  . RENAL INSUFFICIENCY 09/02/2009  . Allergic rhinitis 01/17/2009  . ASTHMA, PERSISTENT, MILD 11/13/2008  . Airway hyperreactivity 11/13/2008  . KNEE REPLACEMENT, LEFT, HX OF 12/18/2006  . HERPES ZOSTER W/NERVOUS COMPLICATION NEC 07/37/1062  . HYPERCHOLESTEROLEMIA 05/19/2006  . ANXIETY 05/19/2006  . Major depressive disorder, recurrent, mild ([place]) 05/19/2006  . RESTLESS LEG SYNDROME 05/19/2006  . DIVERTICULOSIS, COLON 05/19/2006  . IRRITABLE BOWEL SYNDROME 05/19/2006  . LOW BACK PAIN, CHRONIC 05/19/2006  . GAD (generalized anxiety disorder) 05/19/2006  . Diverticular disease of large intestine 05/19/2006  . Essential (primary) hypertension 05/19/2006  . Adaptive colitis 05/19/2006   Past Medical History:  Diagnosis Date  . Arthritis    hands   . Asthma   . Cancer ([place])    SKIN CANCER REMOVED  . Chronic atrial fibrillation ([facility])    a.fib on warfarin- Dr. Percival Spanish follows  . Diverticulosis of colon (without mention of hemorrhage)   . Dysrhythmia    a fib  . ED (erectile dysfunction)   . Fibromyalgia   . GERD (gastroesophageal reflux disease)   .  Hard of hearing    wears  hearing aids  . Headache   . History of blood clots    behind right knee and then went into left lung 25yrs ago  . History of colon polyps   . History of kidney stones   . Hx of diabetes mellitus    "no longer diabetic since gastric bypass" - no longer taking metformin"  in 2 months "problems with low blood sugar now"  . Hx of gout    but doesn't take any meds  . Inflammation of colonic mucosa    recent admission and release from [facility] 02-28-15-remains on oral antibiotic  . Obesity   . Peripheral vascular disease ([place])   . Pneumonia    last time about 66yrs ago  . Pulmonary embolism ([place])    over 10 yrs ago "many years ago"  . Restless legs syndrome (RLS)   . Unspecified asthma(493.90)    as a child  . Unspecified essential hypertension    hx of no longer on medication due to gastric bypass     Past Surgical History:  Procedure Laterality Date  . ANTERIOR CERVICAL DECOMP/DISCECTOMY FUSION N/A 05/09/2013   Procedure: ANTERIOR CERVICAL DECOMPRESSION/DISCECTOMY FUSION CERVICAL THREE-FOUR,CERVICAL FOUR-FIVE,CERVICAL FIVE-SIX;  Surgeon: Floyce Stakes, MD;  Location: [facility];  Service: Neurosurgery;  Laterality: N/A;  . ARTERY REPAIR     Left forearm  . BACK SURGERY     x 3  . BREATH TEK H PYLORI  01/08/2011   Procedure: BREATH TEK H PYLORI;  Surgeon: Pedro Earls, MD;  Location: [facility];  Service: General;  Laterality: N/A;  . CARDIOVERSION     x 2 attempts-unsuccessful.  [name] CATARACT EXTRACTION, BILATERAL    . COLONOSCOPY    . CYSTOSCOPY    . ESOPHAGOGASTRODUODENOSCOPY (EGD) WITH PROPOFOL N/A 09/21/2014   Procedure: ESOPHAGOGASTRODUODENOSCOPY (EGD) WITH PROPOFOL;  Surgeon: Manya Silvas, MD;  Location: [facility] ENDOSCOPY;  Service: Endoscopy;  Laterality: N/A;  . EYE SURGERY     cataract bil  . FOOT SURGERY     Left foot   . GASTRIC ROUX-EN-Y  08/11/2011   Procedure: LAPAROSCOPIC ROUX-EN-Y GASTRIC BYPASS WITH UPPER ENDOSCOPY;  Surgeon: Pedro Earls, MD;   Location: [facility] ORS;  Service: General;  Laterality: N/A;  . HAND SURGERY     LEFT  . injections in back     x 18  . KNEE SURGERY Left    x 4  . KNEE SURGERY Left    arthroscopy  . LAPAROSCOPIC INTERNAL HERNIA REPAIR N/A 03/08/2015   Procedure: LAPAROSCOPIC INTERNAL HERNIA REPAIR ;  Surgeon: [name], MD;  Location: [facility] ORS;  Service: General;  Laterality: N/A;  . LEG SURGERY     FOR NECROTIZING FASCITIS L LEG AND GROIN  . PANNICULECTOMY N/A 08/25/2016   Procedure: PANNICULECTOMY;  Surgeon: [name], MD;  Location: [facility] ORS;  Service: General;  Laterality: N/A;  . PENILE PROSTHESIS IMPLANT N/A 07/02/2014   Procedure: PENILE PROTHESIS INFLATABLE 3 PIECE (COLOPLAST) SCROTAL APPROACH;  Surgeon: Kathie Rhodes, MD;  Location: [facility] ORS;  Service: Urology;  Laterality: N/A;  . PENILE PROSTHESIS IMPLANT N/A 11/23/2014   Procedure: EXPLORATION AND REVISION OF PENILE PROSTHESIS;  Surgeon: Kathie Rhodes, MD;  Location: [facility] ORS;  Service: Urology;  Laterality: N/A;  . REPLACEMENT TOTAL KNEE Left    x 2  . SHOULDER ARTHROSCOPY    . SHOULDER SURGERY Left 2014  . TONSILLECTOMY    . TOTAL SHOULDER ARTHROPLASTY Left 01/26/2013  Procedure: TOTAL SHOULDER ARTHROPLASTY;  Surgeon: Nita Sells, MD;  Location: [facility];  Service: Orthopedics;  Laterality: Left;  Left total shoulder arthroplasty    No current facility-administered medications for this encounter.    Current Outpatient Medications  Medication Sig Dispense Refill Last Dose  . atorvastatin (LIPITOR) 80 MG tablet Take 1 tablet (80 mg total) by mouth at bedtime. 90 tablet 3 Taking  . cyclobenzaprine (FLEXERIL) 10 MG tablet TAKE 1 TABLET BY ORAL ROUTE 2 TIMES EVERY DAY AS NEEDED FOR MUSCLE SPASMS/TIGHTNESS  0 Taking  . FLUoxetine (PROZAC) 40 MG capsule Take 1 capsule (40 mg total) by mouth daily. 90 capsule 1 Taking  . furosemide (LASIX) 20 MG tablet Take 1-2 tablets (20-40 mg total) by mouth daily. (Patient taking differently: Take 40  mg by mouth daily. ) 180 tablet 1 Taking  . hydrocortisone 2.5 % cream APPLICATIONS APPLY ON THE SKIN APPLY ON THE SKIN TWICE A DAY AS NEEDED  0 Taking  . lansoprazole (PREVACID) 30 MG capsule TAKE 1 CAPSULE (30 MG TOTAL) BY MOUTH ONCE DAILY.  3 Taking  . lidocaine (XYLOCAINE) 5 % ointment APPLY 1 GRAM ( 1 GRAM = 1 INCH) TO THE AFFECTED AREA 2 TIMES A DAY  12 Taking  . lisinopril (PRINIVIL,ZESTRIL) 2.5 MG tablet Take 2.5 mg by mouth daily.    Taking  . mometasone (ASMANEX) 220 MCG/INH inhaler Inhale 1 puff into the lungs 2 (two) times daily as needed.      . nystatin cream (MYCOSTATIN) APPLY TO SKIN TWICE DAILY AS DIRECTED AS NEEDED  2 Taking  . omeprazole (PRILOSEC) 40 MG capsule Take 40 mg by mouth daily.     . primidone (MYSOLINE) 50 MG tablet 150 mg in the morning, 100 mg at night 450 tablet 1 Taking  . ranitidine (ZANTAC) 300 MG tablet Take 300 mg by mouth at bedtime.      . silodosin (RAPAFLO) 8 MG CAPS capsule Take 8 mg by mouth daily with breakfast.    Taking  . sucralfate (CARAFATE) 1 g tablet Take 1 g by mouth daily before breakfast.     . tamsulosin (FLOMAX) 0.4 MG CAPS capsule Take 0.4 mg by mouth daily.      [name] warfarin (COUMADIN) 2.5 MG tablet TAKE 1 TO 1.5 TABLETS BY  MOUTH DAILY OR AS DIRECTED  BY COUMADIN CLINIC (Patient taking differently: Take 2.5-3.75 mg by mouth See admin instructions. TAKE 1 TO 1.5 TABLETS BY  MOUTH DAILY OR AS DIRECTED  BY COUMADIN CLINIC 2.5 Sunday and Thursday, all other days is 3.75 mg) 120 tablet 0 Taking  . zolpidem (AMBIEN) 10 MG tablet TAKE 1 TABLET BY MOUTH AT BEDTIME AS NEEDED 30 tablet 1   . gabapentin (NEURONTIN) 300 MG capsule Take 1 capsule (300 mg total) by mouth 2 (two) times daily. 180 capsule 0    Allergies  Allergen Reactions  . Adhesive [Tape] Other (See Comments)    Tears skin-- "No tape of any kind, they all tear skin right off" Tears skin-- "No tape of any kind, they all tear skin right off"  . Sulfacetamide Sodium Swelling     throat swelling  . Latex Itching    Social History   Tobacco Use  . Smoking status: Never Smoker  . Smokeless tobacco: Never Used  Substance Use Topics  . Alcohol use: No    Alcohol/week: 0.0 oz    Family History  Problem Relation Age of Onset  . Uterine cancer Mother  mets  . Breast cancer Mother   . Cancer Mother        cervical  . Emphysema Father   . Alzheimer's disease Sister   . Prostate cancer Brother   . Heart attack Brother   . Colon cancer Brother 34     Review of Systems  Musculoskeletal: Positive for joint pain.       Right knee  All other systems reviewed and are negative.   Objective:  Physical Exam  Constitutional: He is oriented to person, place, and time. He appears well-developed and well-nourished.  HENT:  Head: Normocephalic and atraumatic.  Eyes: Pupils are equal, round, and reactive to light.  Neck: Normal range of motion.  Cardiovascular: Normal rate and regular rhythm.  Respiratory: Effort normal.  GI: Soft.  Musculoskeletal:  Right knee motion is 0-120.  He has pain along the medial joint line and crepitation.  Hip motion is full and straight leg raise is negative.  Sensation and motor function are intact in his feet with palpable pulses on both sides.   Neurological: He is alert and oriented to person, place, and time.  Skin: Skin is warm and dry.  Psychiatric: He has a normal mood and affect. His behavior is normal. Judgment and thought content normal.    Vital signs in last 24 hours:    Labs:   Estimated body mass index is 26.14 kg/m as calculated from the following:   Height as of 04/16/17: 6\' 1"  (1.854 m).   Weight as of 04/16/17: 89.9 kg (198 lb 1.6 oz).   Imaging Review Plain radiographs demonstrate severe degenerative joint disease of the right knee(s). The overall alignment isneutral. The bone quality appears to be good for age and reported activity level.  Assessment/Plan:  End stage Primary arthritis, right knee    The patient history, physical examination, clinical judgment of the provider and imaging studies are consistent with end stage degenerative joint disease of the right knee(s) and total knee arthroplasty is deemed medically necessary. The treatment options including medical management, injection therapy arthroscopy and arthroplasty were discussed at length. The risks and benefits of total knee arthroplasty were presented and reviewed. The risks due to aseptic loosening, infection, stiffness, patella tracking problems, thromboembolic complications and other imponderables were discussed. The patient acknowledged the explanation, agreed to proceed with the plan and consent was signed. Patient is being admitted for inpatient treatment for surgery, pain control, PT, OT, prophylactic antibiotics, VTE prophylaxis, progressive ambulation and ADL's and discharge planning. The patient is planning to be discharged home with home health services

## 2017-04-22 NOTE — Progress Notes (Signed)
Message left on machine for pt not to take coumadin 04-22-17 so last dose will be 04-21-17.

## 2017-04-22 NOTE — Progress Notes (Signed)
Message left

## 2017-04-23 ENCOUNTER — Ambulatory Visit: Payer: Medicare Other | Admitting: Neurology

## 2017-04-26 MED ORDER — TRANEXAMIC ACID 1000 MG/10ML IV SOLN
2000.0000 mg | INTRAVENOUS | Status: AC
  Administered 2017-04-27: 2000 mg via TOPICAL
  Filled 2017-04-26: qty 20

## 2017-04-26 MED ORDER — VANCOMYCIN HCL IN DEXTROSE 1-5 GM/200ML-% IV SOLN
1000.0000 mg | Freq: Once | INTRAVENOUS | Status: AC
  Administered 2017-04-27: 1000 mg via INTRAVENOUS
  Filled 2017-04-26: qty 200

## 2017-04-26 MED ORDER — CEFAZOLIN SODIUM-DEXTROSE 2-4 GM/100ML-% IV SOLN
2.0000 g | INTRAVENOUS | Status: AC
  Administered 2017-04-27: 2 g via INTRAVENOUS
  Filled 2017-04-26: qty 100

## 2017-04-26 MED ORDER — SODIUM CHLORIDE 0.9 % IV SOLN
1000.0000 mg | INTRAVENOUS | Status: DC
  Filled 2017-04-26: qty 10

## 2017-04-26 MED ORDER — VANCOMYCIN HCL IN DEXTROSE 1-5 GM/200ML-% IV SOLN
1000.0000 mg | Freq: Once | INTRAVENOUS | Status: DC
  Filled 2017-04-26: qty 200

## 2017-04-27 ENCOUNTER — Inpatient Hospital Stay (HOSPITAL_COMMUNITY)
Admission: RE | Admit: 2017-04-27 | Discharge: 2017-04-29 | DRG: 470 | Disposition: A | Discharge status: Home Health | Payer: Medicare Other | Source: Ambulatory Visit | Attending: Orthopaedic Surgery | Admitting: Orthopaedic Surgery

## 2017-04-27 ENCOUNTER — Encounter (HOSPITAL_COMMUNITY): Payer: Self-pay | Admitting: Certified Registered"

## 2017-04-27 ENCOUNTER — Encounter (HOSPITAL_COMMUNITY)
Admission: RE | Disposition: A | Discharge status: Home Health | Payer: Self-pay | Source: Ambulatory Visit | Attending: Orthopaedic Surgery

## 2017-04-27 ENCOUNTER — Inpatient Hospital Stay (HOSPITAL_COMMUNITY): Payer: Medicare Other | Admitting: Emergency Medicine

## 2017-04-27 ENCOUNTER — Other Ambulatory Visit: Payer: Self-pay

## 2017-04-27 DIAGNOSIS — I482 Chronic atrial fibrillation: Secondary | ICD-10-CM | POA: Diagnosis not present

## 2017-04-27 DIAGNOSIS — I10 Essential (primary) hypertension: Secondary | ICD-10-CM | POA: Diagnosis not present

## 2017-04-27 DIAGNOSIS — E7800 Pure hypercholesterolemia, unspecified: Secondary | ICD-10-CM | POA: Diagnosis present

## 2017-04-27 DIAGNOSIS — Z7901 Long term (current) use of anticoagulants: Secondary | ICD-10-CM | POA: Diagnosis not present

## 2017-04-27 DIAGNOSIS — K219 Gastro-esophageal reflux disease without esophagitis: Secondary | ICD-10-CM | POA: Diagnosis present

## 2017-04-27 DIAGNOSIS — J449 Chronic obstructive pulmonary disease, unspecified: Secondary | ICD-10-CM | POA: Diagnosis not present

## 2017-04-27 DIAGNOSIS — Z96612 Presence of left artificial shoulder joint: Secondary | ICD-10-CM | POA: Diagnosis present

## 2017-04-27 DIAGNOSIS — M810 Age-related osteoporosis without current pathological fracture: Secondary | ICD-10-CM | POA: Diagnosis present

## 2017-04-27 DIAGNOSIS — Z86711 Personal history of pulmonary embolism: Secondary | ICD-10-CM | POA: Diagnosis not present

## 2017-04-27 DIAGNOSIS — Z79899 Other long term (current) drug therapy: Secondary | ICD-10-CM | POA: Diagnosis not present

## 2017-04-27 DIAGNOSIS — Z9841 Cataract extraction status, right eye: Secondary | ICD-10-CM | POA: Diagnosis not present

## 2017-04-27 DIAGNOSIS — H9190 Unspecified hearing loss, unspecified ear: Secondary | ICD-10-CM | POA: Diagnosis present

## 2017-04-27 DIAGNOSIS — Z6826 Body mass index (BMI) 26.0-26.9, adult: Secondary | ICD-10-CM

## 2017-04-27 DIAGNOSIS — Z9884 Bariatric surgery status: Secondary | ICD-10-CM

## 2017-04-27 DIAGNOSIS — M1711 Unilateral primary osteoarthritis, right knee: Principal | ICD-10-CM | POA: Diagnosis present

## 2017-04-27 DIAGNOSIS — Z85828 Personal history of other malignant neoplasm of skin: Secondary | ICD-10-CM | POA: Diagnosis not present

## 2017-04-27 DIAGNOSIS — Z981 Arthrodesis status: Secondary | ICD-10-CM

## 2017-04-27 DIAGNOSIS — Z96652 Presence of left artificial knee joint: Secondary | ICD-10-CM | POA: Diagnosis present

## 2017-04-27 DIAGNOSIS — Z8249 Family history of ischemic heart disease and other diseases of the circulatory system: Secondary | ICD-10-CM | POA: Diagnosis not present

## 2017-04-27 DIAGNOSIS — G8918 Other acute postprocedural pain: Secondary | ICD-10-CM | POA: Diagnosis not present

## 2017-04-27 DIAGNOSIS — E1143 Type 2 diabetes mellitus with diabetic autonomic (poly)neuropathy: Secondary | ICD-10-CM | POA: Diagnosis not present

## 2017-04-27 DIAGNOSIS — F330 Major depressive disorder, recurrent, mild: Secondary | ICD-10-CM | POA: Diagnosis present

## 2017-04-27 DIAGNOSIS — Z9842 Cataract extraction status, left eye: Secondary | ICD-10-CM | POA: Diagnosis not present

## 2017-04-27 DIAGNOSIS — E669 Obesity, unspecified: Secondary | ICD-10-CM | POA: Diagnosis present

## 2017-04-27 HISTORY — PX: TOTAL KNEE ARTHROPLASTY: SHX125

## 2017-04-27 LAB — CBC
HCT: 37.2 % — ABNORMAL LOW (ref 39.0–52.0)
HEMOGLOBIN: 12.2 g/dL — AB (ref 13.0–17.0)
MCH: 31.1 pg (ref 26.0–34.0)
MCHC: 32.8 g/dL (ref 30.0–36.0)
MCV: 94.9 fL (ref 78.0–100.0)
Platelets: 193 10*3/uL (ref 150–400)
RBC: 3.92 MIL/uL — ABNORMAL LOW (ref 4.22–5.81)
RDW: 14 % (ref 11.5–15.5)
WBC: 6.7 10*3/uL (ref 4.0–10.5)

## 2017-04-27 LAB — CREATININE, SERUM
CREATININE: 1.6 mg/dL — AB (ref 0.61–1.24)
GFR, EST AFRICAN AMERICAN: 47 mL/min — AB (ref >60)
GFR, EST NON AFRICAN AMERICAN: 41 mL/min — AB (ref >60)

## 2017-04-27 LAB — PROTIME-INR
INR: 1.16
PROTHROMBIN TIME: 14.7 s (ref 11.4–15.2)

## 2017-04-27 LAB — APTT: aPTT: 31 seconds (ref 24–36)

## 2017-04-27 LAB — GLUCOSE, CAPILLARY: GLUCOSE-CAPILLARY: 129 mg/dL — AB (ref 65–99)

## 2017-04-27 SURGERY — ARTHROPLASTY, KNEE, TOTAL
Anesthesia: Spinal | Laterality: Right

## 2017-04-27 MED ORDER — DIPHENHYDRAMINE HCL 12.5 MG/5ML PO ELIX: 12.5000 mg | ORAL_SOLUTION | ORAL | Status: DC | PRN

## 2017-04-27 MED ORDER — PHENYLEPHRINE 40 MCG/ML (10ML) SYRINGE FOR IV PUSH (FOR BLOOD PRESSURE SUPPORT)
PREFILLED_SYRINGE | INTRAVENOUS | Status: DC | PRN
  Administered 2017-04-27 (×3): 80 ug via INTRAVENOUS
  Administered 2017-04-27: 40 ug via INTRAVENOUS

## 2017-04-27 MED ORDER — MEPERIDINE HCL 50 MG/ML IJ SOLN: 6.2500 mg | INTRAMUSCULAR | Status: DC | PRN

## 2017-04-27 MED ORDER — SODIUM CHLORIDE 0.9% FLUSH
INTRAVENOUS | Status: DC | PRN
  Administered 2017-04-27: 10 mL

## 2017-04-27 MED ORDER — CYCLOBENZAPRINE HCL 10 MG PO TABS: 10.0000 mg | ORAL_TABLET | Freq: Every day | ORAL | Status: DC

## 2017-04-27 MED ORDER — FENTANYL CITRATE (PF) 100 MCG/2ML IJ SOLN
50.0000 ug | Freq: Once | INTRAMUSCULAR | Status: AC
Start: 2017-04-27 — End: 2017-04-27
  Administered 2017-04-27: 50 ug via INTRAVENOUS

## 2017-04-27 MED ORDER — ROPIVACAINE HCL 7.5 MG/ML IJ SOLN
INTRAMUSCULAR | Status: DC | PRN
  Administered 2017-04-27: 20 mL via PERINEURAL

## 2017-04-27 MED ORDER — DOCUSATE SODIUM 100 MG PO CAPS
100.0000 mg | ORAL_CAPSULE | Freq: Two times a day (BID) | ORAL | Status: DC
  Administered 2017-04-27 – 2017-04-29 (×4): 100 mg via ORAL
  Filled 2017-04-27 (×4): qty 1

## 2017-04-27 MED ORDER — MIDAZOLAM HCL 5 MG/5ML IJ SOLN
INTRAMUSCULAR | Status: DC | PRN
  Administered 2017-04-27: 2 mg via INTRAVENOUS

## 2017-04-27 MED ORDER — ZOLPIDEM TARTRATE 5 MG PO TABS
5.0000 mg | ORAL_TABLET | Freq: Every evening | ORAL | Status: DC | PRN
  Administered 2017-04-27 – 2017-04-28 (×2): 5 mg via ORAL
  Filled 2017-04-27 (×2): qty 1

## 2017-04-27 MED ORDER — PRIMIDONE 50 MG PO TABS
100.0000 mg | ORAL_TABLET | Freq: Two times a day (BID) | ORAL | Status: DC
  Administered 2017-04-27 – 2017-04-29 (×3): 100 mg via ORAL
  Filled 2017-04-27 (×4): qty 2

## 2017-04-27 MED ORDER — 0.9 % SODIUM CHLORIDE (POUR BTL) OPTIME
TOPICAL | Status: DC | PRN
  Administered 2017-04-27: 1000 mL

## 2017-04-27 MED ORDER — FENTANYL CITRATE (PF) 250 MCG/5ML IJ SOLN
INTRAMUSCULAR | Status: AC
  Filled 2017-04-27: qty 5

## 2017-04-27 MED ORDER — METHOCARBAMOL 500 MG PO TABS
500.0000 mg | ORAL_TABLET | Freq: Four times a day (QID) | ORAL | Status: DC | PRN
  Administered 2017-04-27 – 2017-04-29 (×4): 500 mg via ORAL
  Filled 2017-04-27 (×4): qty 1

## 2017-04-27 MED ORDER — MIDAZOLAM HCL 2 MG/2ML IJ SOLN
1.0000 mg | Freq: Once | INTRAMUSCULAR | Status: AC
  Administered 2017-04-27: 1 mg via INTRAVENOUS

## 2017-04-27 MED ORDER — METHOCARBAMOL 1000 MG/10ML IJ SOLN
500.0000 mg | Freq: Four times a day (QID) | INTRAMUSCULAR | Status: DC | PRN
  Filled 2017-04-27: qty 5

## 2017-04-27 MED ORDER — VANCOMYCIN HCL IN DEXTROSE 1-5 GM/200ML-% IV SOLN
1000.0000 mg | Freq: Two times a day (BID) | INTRAVENOUS | Status: AC
  Administered 2017-04-27: 1000 mg via INTRAVENOUS
  Filled 2017-04-27: qty 200

## 2017-04-27 MED ORDER — METOCLOPRAMIDE HCL 5 MG PO TABS: 5.0000 mg | ORAL_TABLET | Freq: Three times a day (TID) | ORAL | Status: DC | PRN

## 2017-04-27 MED ORDER — ACETAMINOPHEN 325 MG PO TABS: 325.0000 mg | ORAL_TABLET | Freq: Four times a day (QID) | ORAL | Status: DC | PRN

## 2017-04-27 MED ORDER — PROPOFOL 500 MG/50ML IV EMUL
INTRAVENOUS | Status: DC | PRN
  Administered 2017-04-27: 100 ug/kg/min via INTRAVENOUS

## 2017-04-27 MED ORDER — ONDANSETRON HCL 4 MG/2ML IJ SOLN
INTRAMUSCULAR | Status: AC
  Filled 2017-04-27: qty 2

## 2017-04-27 MED ORDER — BUPIVACAINE LIPOSOME 1.3 % IJ SUSP
20.0000 mL | INTRAMUSCULAR | Status: AC
  Administered 2017-04-27: 20 mL
  Filled 2017-04-27: qty 20

## 2017-04-27 MED ORDER — WARFARIN - PHARMACIST DOSING INPATIENT
Freq: Every day | Status: DC
  Administered 2017-04-27 – 2017-04-28 (×2)

## 2017-04-27 MED ORDER — MORPHINE SULFATE (PF) 2 MG/ML IV SOLN
0.5000 mg | INTRAVENOUS | Status: DC | PRN
  Administered 2017-04-27 – 2017-04-29 (×2): 1 mg via INTRAVENOUS
  Filled 2017-04-27 (×2): qty 1

## 2017-04-27 MED ORDER — MENTHOL 3 MG MT LOZG: 1.0000 | LOZENGE | OROMUCOSAL | Status: DC | PRN

## 2017-04-27 MED ORDER — BUDESONIDE 0.5 MG/2ML IN SUSP
0.5000 mg | Freq: Two times a day (BID) | RESPIRATORY_TRACT | Status: DC
  Filled 2017-04-27: qty 2

## 2017-04-27 MED ORDER — GABAPENTIN 300 MG PO CAPS
300.0000 mg | ORAL_CAPSULE | Freq: Two times a day (BID) | ORAL | Status: DC
  Administered 2017-04-27 – 2017-04-29 (×4): 300 mg via ORAL
  Filled 2017-04-27 (×4): qty 1

## 2017-04-27 MED ORDER — KETOROLAC TROMETHAMINE 15 MG/ML IJ SOLN
15.0000 mg | Freq: Four times a day (QID) | INTRAMUSCULAR | Status: AC
  Administered 2017-04-27 – 2017-04-28 (×3): 15 mg via INTRAVENOUS
  Filled 2017-04-27 (×3): qty 1

## 2017-04-27 MED ORDER — FLUOXETINE HCL 20 MG PO CAPS
40.0000 mg | ORAL_CAPSULE | Freq: Every day | ORAL | Status: DC
  Administered 2017-04-27 – 2017-04-29 (×3): 40 mg via ORAL
  Filled 2017-04-27 (×3): qty 2

## 2017-04-27 MED ORDER — WARFARIN SODIUM 5 MG PO TABS
5.0000 mg | ORAL_TABLET | Freq: Once | ORAL | Status: AC
  Administered 2017-04-27: 5 mg via ORAL
  Filled 2017-04-27 (×2): qty 1

## 2017-04-27 MED ORDER — LACTATED RINGERS IV SOLN: INTRAVENOUS | Status: DC

## 2017-04-27 MED ORDER — SODIUM CHLORIDE 0.9 % IR SOLN
Status: DC | PRN
  Administered 2017-04-27: 3000 mL

## 2017-04-27 MED ORDER — ENOXAPARIN SODIUM 30 MG/0.3ML ~~LOC~~ SOLN
30.0000 mg | Freq: Two times a day (BID) | SUBCUTANEOUS | Status: DC
  Administered 2017-04-28 – 2017-04-29 (×3): 30 mg via SUBCUTANEOUS
  Filled 2017-04-27 (×3): qty 0.3

## 2017-04-27 MED ORDER — PANTOPRAZOLE SODIUM 40 MG PO TBEC
80.0000 mg | DELAYED_RELEASE_TABLET | Freq: Every day | ORAL | Status: DC
  Administered 2017-04-27 – 2017-04-29 (×3): 80 mg via ORAL
  Filled 2017-04-27 (×3): qty 2

## 2017-04-27 MED ORDER — ONDANSETRON HCL 4 MG PO TABS: 4.0000 mg | ORAL_TABLET | Freq: Four times a day (QID) | ORAL | Status: DC | PRN

## 2017-04-27 MED ORDER — PHENOL 1.4 % MT LIQD: 1.0000 | OROMUCOSAL | Status: DC | PRN

## 2017-04-27 MED ORDER — FUROSEMIDE 40 MG PO TABS
40.0000 mg | ORAL_TABLET | Freq: Every day | ORAL | Status: DC
  Administered 2017-04-27 – 2017-04-29 (×3): 40 mg via ORAL
  Filled 2017-04-27 (×3): qty 1

## 2017-04-27 MED ORDER — ONDANSETRON HCL 4 MG/2ML IJ SOLN: 4.0000 mg | Freq: Four times a day (QID) | INTRAMUSCULAR | Status: DC | PRN

## 2017-04-27 MED ORDER — ATORVASTATIN CALCIUM 80 MG PO TABS
80.0000 mg | ORAL_TABLET | Freq: Every day | ORAL | Status: DC
  Administered 2017-04-27 – 2017-04-28 (×2): 80 mg via ORAL
  Filled 2017-04-27 (×2): qty 1

## 2017-04-27 MED ORDER — ONDANSETRON HCL 4 MG/2ML IJ SOLN
INTRAMUSCULAR | Status: DC | PRN
  Administered 2017-04-27: 4 mg via INTRAVENOUS

## 2017-04-27 MED ORDER — MIDAZOLAM HCL 2 MG/2ML IJ SOLN
INTRAMUSCULAR | Status: AC
  Administered 2017-04-27: 1 mg via INTRAVENOUS
  Filled 2017-04-27: qty 2

## 2017-04-27 MED ORDER — ACETAMINOPHEN 500 MG PO TABS
500.0000 mg | ORAL_TABLET | Freq: Four times a day (QID) | ORAL | Status: AC
  Administered 2017-04-27 – 2017-04-28 (×2): 500 mg via ORAL
  Filled 2017-04-27 (×2): qty 1

## 2017-04-27 MED ORDER — HYDROMORPHONE HCL 1 MG/ML IJ SOLN: 0.2500 mg | INTRAMUSCULAR | Status: DC | PRN

## 2017-04-27 MED ORDER — METOCLOPRAMIDE HCL 5 MG/ML IJ SOLN: 5.0000 mg | Freq: Three times a day (TID) | INTRAMUSCULAR | Status: DC | PRN

## 2017-04-27 MED ORDER — MIDAZOLAM HCL 2 MG/2ML IJ SOLN
INTRAMUSCULAR | Status: AC
  Filled 2017-04-27: qty 2

## 2017-04-27 MED ORDER — PHENYLEPHRINE 40 MCG/ML (10ML) SYRINGE FOR IV PUSH (FOR BLOOD PRESSURE SUPPORT)
PREFILLED_SYRINGE | INTRAVENOUS | Status: AC
  Filled 2017-04-27: qty 10

## 2017-04-27 MED ORDER — BUPIVACAINE-EPINEPHRINE (PF) 0.5% -1:200000 IJ SOLN
INTRAMUSCULAR | Status: AC
  Filled 2017-04-27: qty 30

## 2017-04-27 MED ORDER — FAMOTIDINE 20 MG PO TABS
20.0000 mg | ORAL_TABLET | Freq: Every day | ORAL | Status: DC
  Administered 2017-04-27 – 2017-04-28 (×2): 20 mg via ORAL
  Filled 2017-04-27 (×2): qty 1

## 2017-04-27 MED ORDER — ALUM & MAG HYDROXIDE-SIMETH 200-200-20 MG/5ML PO SUSP: 30.0000 mL | ORAL | Status: DC | PRN

## 2017-04-27 MED ORDER — ONDANSETRON HCL 4 MG/2ML IJ SOLN: 4.0000 mg | Freq: Once | INTRAMUSCULAR | Status: DC | PRN

## 2017-04-27 MED ORDER — HYDROCODONE-ACETAMINOPHEN 7.5-325 MG PO TABS
1.0000 | ORAL_TABLET | ORAL | Status: DC | PRN
  Administered 2017-04-27 – 2017-04-28 (×2): 1 via ORAL
  Administered 2017-04-28 (×2): 2 via ORAL
  Administered 2017-04-29: 1 via ORAL
  Administered 2017-04-29: 2 via ORAL
  Filled 2017-04-27 (×2): qty 1
  Filled 2017-04-27 (×3): qty 2
  Filled 2017-04-27: qty 1

## 2017-04-27 MED ORDER — HYDROCODONE-ACETAMINOPHEN 5-325 MG PO TABS
1.0000 | ORAL_TABLET | ORAL | Status: DC | PRN
  Administered 2017-04-29: 2 via ORAL
  Filled 2017-04-27: qty 2

## 2017-04-27 MED ORDER — TAMSULOSIN HCL 0.4 MG PO CAPS
0.4000 mg | ORAL_CAPSULE | Freq: Every day | ORAL | Status: DC
  Administered 2017-04-28 – 2017-04-29 (×2): 0.4 mg via ORAL
  Filled 2017-04-27 (×2): qty 1

## 2017-04-27 MED ORDER — LACTATED RINGERS IV SOLN
INTRAVENOUS | Status: DC
  Administered 2017-04-27: 17:00:00 via INTRAVENOUS

## 2017-04-27 MED ORDER — FENTANYL CITRATE (PF) 100 MCG/2ML IJ SOLN
INTRAMUSCULAR | Status: AC
  Administered 2017-04-27: 50 ug via INTRAVENOUS
  Filled 2017-04-27: qty 2

## 2017-04-27 MED ORDER — CHLORHEXIDINE GLUCONATE 4 % EX LIQD: 60.0000 mL | Freq: Once | CUTANEOUS | Status: DC

## 2017-04-27 MED ORDER — BUPIVACAINE IN DEXTROSE 0.75-8.25 % IT SOLN
INTRATHECAL | Status: DC | PRN
  Administered 2017-04-27: 2 mL via INTRATHECAL

## 2017-04-27 MED ORDER — LACTATED RINGERS IV SOLN
INTRAVENOUS | Status: DC
  Administered 2017-04-27 (×2): via INTRAVENOUS

## 2017-04-27 MED ORDER — BISACODYL 5 MG PO TBEC: 5.0000 mg | DELAYED_RELEASE_TABLET | Freq: Every day | ORAL | Status: DC | PRN

## 2017-04-27 MED ORDER — SUCRALFATE 1 G PO TABS
1.0000 g | ORAL_TABLET | Freq: Every day | ORAL | Status: DC
  Administered 2017-04-28 – 2017-04-29 (×2): 1 g via ORAL
  Filled 2017-04-27 (×2): qty 1

## 2017-04-27 MED ORDER — LISINOPRIL 2.5 MG PO TABS
2.5000 mg | ORAL_TABLET | Freq: Every day | ORAL | Status: DC
  Administered 2017-04-27 – 2017-04-29 (×3): 2.5 mg via ORAL
  Filled 2017-04-27 (×3): qty 1

## 2017-04-27 MED ORDER — BUPIVACAINE-EPINEPHRINE (PF) 0.5% -1:200000 IJ SOLN
INTRAMUSCULAR | Status: DC | PRN
Start: 2017-04-27 — End: 2017-04-27
  Administered 2017-04-27: 30 mL via PERINEURAL

## 2017-04-27 SURGICAL SUPPLY — 59 items
BAG DECANTER FOR FLEXI CONT (MISCELLANEOUS) IMPLANT
BANDAGE ELASTIC 6 VELCRO ST LF (GAUZE/BANDAGES/DRESSINGS) ×2 IMPLANT
BANDAGE ESMARK 6X9 LF (GAUZE/BANDAGES/DRESSINGS) ×1 IMPLANT
BLADE SAGITTAL 25.0X1.19X90 (BLADE) IMPLANT
BLADE SAGITTAL 25.0X1.19X90MM (BLADE)
BLADE SAW SGTL 13.0X1.19X90.0M (BLADE) IMPLANT
BNDG CMPR 9X6 STRL LF SNTH (GAUZE/BANDAGES/DRESSINGS) ×1
BNDG CMPR MED 10X6 ELC LF (GAUZE/BANDAGES/DRESSINGS) ×1
BNDG ELASTIC 6X10 VLCR STRL LF (GAUZE/BANDAGES/DRESSINGS) ×3 IMPLANT
BNDG ESMARK 6X9 LF (GAUZE/BANDAGES/DRESSINGS) ×3
BOWL SMART MIX CTS (DISPOSABLE) ×3 IMPLANT
CAP KNEE TOTAL 3 SIGMA ×2 IMPLANT
CEMENT HV SMART SET (Cement) ×6 IMPLANT
CLOSURE WOUND 1/2 X4 (GAUZE/BANDAGES/DRESSINGS) ×1
COVER SURGICAL LIGHT HANDLE (MISCELLANEOUS) ×3 IMPLANT
CUFF TOURNIQUET SINGLE 34IN LL (TOURNIQUET CUFF) ×3 IMPLANT
CUFF TOURNIQUET SINGLE 44IN (TOURNIQUET CUFF) IMPLANT
DECANTER SPIKE VIAL GLASS SM (MISCELLANEOUS) ×3 IMPLANT
DRAPE EXTREMITY T 121X128X90 (DRAPE) ×3 IMPLANT
DRAPE HALF SHEET 40X57 (DRAPES) ×6 IMPLANT
DRAPE U-SHAPE 47X51 STRL (DRAPES) ×3 IMPLANT
DRESSING AQUACEL AG SP 3.5X10 (GAUZE/BANDAGES/DRESSINGS) IMPLANT
DRSG AQUACEL AG ADV 3.5X10 (GAUZE/BANDAGES/DRESSINGS) ×3 IMPLANT
DRSG AQUACEL AG SP 3.5X10 (GAUZE/BANDAGES/DRESSINGS) ×3
DURAPREP 26ML APPLICATOR (WOUND CARE) ×3 IMPLANT
ELECT REM PT RETURN 9FT ADLT (ELECTROSURGICAL) ×3
ELECTRODE REM PT RTRN 9FT ADLT (ELECTROSURGICAL) ×1 IMPLANT
GLOVE BIO SURGEON STRL SZ8 (GLOVE) ×6 IMPLANT
GLOVE BIOGEL PI IND STRL 8 (GLOVE) ×2 IMPLANT
GLOVE BIOGEL PI INDICATOR 8 (GLOVE) ×4
GOWN STRL REUS W/ TWL LRG LVL3 (GOWN DISPOSABLE) ×1 IMPLANT
GOWN STRL REUS W/ TWL XL LVL3 (GOWN DISPOSABLE) ×2 IMPLANT
GOWN STRL REUS W/TWL LRG LVL3 (GOWN DISPOSABLE) ×3
GOWN STRL REUS W/TWL XL LVL3 (GOWN DISPOSABLE) ×6
HANDPIECE INTERPULSE COAX TIP (DISPOSABLE) ×3
HOOD PEEL AWAY FACE SHEILD DIS (HOOD) ×6 IMPLANT
IMMOBILIZER KNEE 22 (SOFTGOODS) ×2 IMPLANT
IMMOBILIZER KNEE 22 UNIV (SOFTGOODS) ×3 IMPLANT
KIT BASIN OR (CUSTOM PROCEDURE TRAY) ×3 IMPLANT
KIT ROOM TURNOVER OR (KITS) ×3 IMPLANT
MANIFOLD NEPTUNE II (INSTRUMENTS) ×3 IMPLANT
NDL HYPO 21X1 ECLIPSE (NEEDLE) ×1 IMPLANT
NEEDLE HYPO 21X1 ECLIPSE (NEEDLE) ×3 IMPLANT
NS IRRIG 1000ML POUR BTL (IV SOLUTION) ×3 IMPLANT
PACK TOTAL JOINT (CUSTOM PROCEDURE TRAY) ×3 IMPLANT
PAD ARMBOARD 7.5X6 YLW CONV (MISCELLANEOUS) ×6 IMPLANT
PIN STEINMAN FIXATION KNEE (PIN) IMPLANT
SET HNDPC FAN SPRY TIP SCT (DISPOSABLE) ×1 IMPLANT
STRIP CLOSURE SKIN 1/2X4 (GAUZE/BANDAGES/DRESSINGS) ×2 IMPLANT
SUT VIC AB 0 CT1 27 (SUTURE) ×3
SUT VIC AB 0 CT1 27XBRD ANBCTR (SUTURE) ×1 IMPLANT
SUT VIC AB 2-0 CT1 27 (SUTURE) ×3
SUT VIC AB 2-0 CT1 TAPERPNT 27 (SUTURE) ×1 IMPLANT
SUT VIC AB 3-0 FS2 27 (SUTURE) ×3 IMPLANT
SUT VLOC 180 0 24IN GS25 (SUTURE) ×3 IMPLANT
SYR 50ML LL SCALE MARK (SYRINGE) ×3 IMPLANT
TOWEL OR 17X24 6PK STRL BLUE (TOWEL DISPOSABLE) ×3 IMPLANT
TOWEL OR 17X26 10 PK STRL BLUE (TOWEL DISPOSABLE) ×3 IMPLANT
TRAY CATH 16FR W/PLASTIC CATH (SET/KITS/TRAYS/PACK) IMPLANT

## 2017-04-27 NOTE — Anesthesia Postprocedure Evaluation (Signed)
Anesthesia Post Note  Patient: [name]  Procedure(s) Performed: TOTAL KNEE ARTHROPLASTY (Right )     Patient location during evaluation: PACU Anesthesia Type: Spinal Level of consciousness: awake and alert and oriented Pain management: pain level controlled Vital Signs Assessment: post-procedure vital signs reviewed and stable Respiratory status: nonlabored ventilation, spontaneous breathing and respiratory function stable Cardiovascular status: blood pressure returned to baseline and stable Postop Assessment: spinal receding, patient able to bend at knees, no headache and no backache Anesthetic complications: no                   Raihana Balderrama A.

## 2017-04-27 NOTE — Anesthesia Procedure Notes (Addendum)
Anesthesia Regional Block: Adductor canal block   Pre-Anesthetic Checklist: ,, timeout performed, Correct Patient, Correct Site, Correct Laterality, Correct Procedure, Correct Position, site marked, Risks and benefits discussed,  Surgical consent,  Pre-op evaluation,  At surgeon's request and post-op pain management  Laterality: Right  Prep: chloraprep       Needles:  Injection technique: Single-shot  Needle Type: Echogenic Stimulator Needle     Needle Length: 10cm  Needle Gauge: 21   Needle insertion depth: 5 cm   Additional Needles:   Procedures:,,,, ultrasound used (permanent image in chart),,,,  Narrative:  Start time: 04/27/2017 9:32 AM End time: 04/27/2017 9:37 AM Injection made incrementally with aspirations every 5 mL.  Performed by: Personally  Anesthesiologist: Josephine Igo, MD  Additional Notes: Timeout performed. Patient sedated. Relevant anatomy ID'd using [place]. Incremental 2-5ml injection of LA with frequent aspiration. Patient tolerated procedure well.        Right Adductor Canal Block

## 2017-04-27 NOTE — Anesthesia Procedure Notes (Signed)
Spinal  Patient location during procedure: OR Start time: 04/27/2017 10:35 AM Staffing Anesthesiologist: Josephine Igo, MD Performed: anesthesiologist  Preanesthetic Checklist Completed: patient identified, site marked, surgical consent, pre-op evaluation, timeout performed, IV checked, risks and benefits discussed and monitors and equipment checked Spinal Block Patient position: sitting Prep: site prepped and draped and DuraPrep Patient monitoring: heart rate, cardiac monitor, continuous pulse ox and blood pressure Approach: midline Location: L3-4 Injection technique: single-shot Needle Needle type: Pencan  Needle gauge: 24 G Needle length: 9 cm Needle insertion depth: 6 cm Assessment Sensory level: T6 Additional Notes Patient tolerated procedure well. Adequate sensory level.

## 2017-04-27 NOTE — Op Note (Signed)
PREOP DIAGNOSIS: DJD RIGHT KNEE POSTOP DIAGNOSIS: same PROCEDURE: RIGHT TKR ANESTHESIA: Spinal and MAC ATTENDING SURGEON: Spencer Peterkin G ASSISTANT: Loni Dolly PA  INDICATIONS FOR PROCEDURE: ITimothy B Mercado is a 77 y.o. male who has struggled for a long time with pain due to degenerative arthritis of the right knee.  The patient has failed many conservative non-operative measures and at this point has pain which limits the ability to sleep and walk.  The patient is offered total knee replacement.  Informed operative consent was obtained after discussion of possible risks of anesthesia, infection, neurovascular injury, DVT, and death.  The importance of the post-operative rehabilitation protocol to optimize result was stressed extensively with the patient.  SUMMARY OF FINDINGS AND PROCEDURE:  [name] was taken to the operative suite where under the above anesthesia a right knee replacement was performed.  There were advanced degenerative changes and the bone quality was excellent.  We used the DePuy LCS system and placed size standard plus femur, 5 tibia, 35 mm all polyethylene patella, and a size 10 mm spacer.  Loni Dolly PA-C assisted throughout and was invaluable to the completion of the case in that he helped retract and maintain exposure while I placed components.  He also helped close thereby minimizing OR time.  The patient was admitted for appropriate post-op care to include perioperative antibiotics and mechanical and pharmacologic measures for DVT prophylaxis.  DESCRIPTION OF PROCEDURE:  [name] was taken to the operative suite where the above anesthesia was applied.  The patient was positioned supine and prepped and draped in normal sterile fashion.  An appropriate time out was performed.  After the administration of kefzol and vancomycin (due to preop MRSA positivity)  pre-op antibiotic the leg was elevated and exsanguinated and a tourniquet inflated. A standard  longitudinal incision was made on the anterior knee.  Dissection was carried down to the extensor mechanism.  All appropriate anti-infective measures were used including the pre-operative antibiotic, betadine impregnated drape, and closed hooded exhaust systems for each member of the surgical team.  A medial parapatellar incision was made in the extensor mechanism and the knee cap flipped and the knee flexed.  Some residual meniscal tissues were removed along with any remaining ACL/PCL tissue.  A guide was placed on the tibia and a flat cut was made on it's superior surface.  An intramedullary guide was placed in the femur and was utilized to make anterior and posterior cuts creating an appropriate flexion gap.  A second intramedullary guide was placed in the femur to make a distal cut properly balancing the knee with an extension gap equal to the flexion gap.  The three bones sized to the above mentioned sizes and the appropriate guides were placed and utilized.  A trial reduction was done and the knee easily came to full extension and the patella tracked well on flexion.  The trial components were removed and all bones were cleaned with pulsatile lavage and then dried thoroughly.  Cement was mixed and was pressurized onto the bones followed by placement of the aforementioned components.  Excess cement was trimmed and pressure was held on the components until the cement had hardened.  The tourniquet was deflated and a small amount of bleeding was controlled with cautery and pressure.  The knee was irrigated thoroughly.  The extensor mechanism was re-approximated with V-loc suture in running fashion.  The knee was flexed and the repair was solid.  The subcutaneous tissues were re-approximated with #0  and #2-0 vicryl and the skin closed with a subcuticular stitch and steristrips.  A sterile dressing was applied.  Intraoperative fluids, EBL, and tourniquet time can be obtained from anesthesia records.  DISPOSITION:   The patient was taken to recovery room in stable condition and admitted for appropriate post-op care to include peri-operative antibiotic and DVT prophylaxis with mechanical and pharmacologic measures.  Dezyrae Kensinger G 04/27/2017, 12:11 PM

## 2017-04-27 NOTE — Anesthesia Procedure Notes (Signed)
Date/Time: 04/27/2017 10:44 AM Performed by: Myna Bright, CRNA Pre-anesthesia Checklist: Patient identified, Emergency Drugs available, Suction available, Patient being monitored and Timeout performed Oxygen Delivery Method: Nasal cannula

## 2017-04-27 NOTE — Transfer of Care (Signed)
Immediate Anesthesia Transfer of Care Note  Patient: [name]  Procedure(s) Performed: TOTAL KNEE ARTHROPLASTY (Right )  Patient Location: PACU  Anesthesia Type:Spinal and MAC combined with regional for post-op pain  Level of Consciousness: awake, alert , oriented and patient cooperative  Airway & Oxygen Therapy: Patient Spontanous Breathing  Post-op Assessment: Report given to RN and Post -op Vital signs reviewed and stable  Post vital signs: Reviewed and stable  Last Vitals:  Vitals:   04/27/17 0940 04/27/17 0945  BP: 123/81 127/74  Pulse: 65 75  Resp: 13 17  Temp:    SpO2: 100% 100%    Last Pain:  Vitals:   04/27/17 0826  TempSrc: Oral      Patients Stated Pain Goal: 2 (82/51/89 8421)  Complications: No apparent anesthesia complications

## 2017-04-27 NOTE — Anesthesia Preprocedure Evaluation (Addendum)
Anesthesia Evaluation  Patient identified by MRN, date of birth, ID band Patient awake    Reviewed: Allergy & Precautions, NPO status , Patient's Chart, lab work & pertinent test results, reviewed documented beta blocker date and time   Airway Mallampati: I  TM Distance: >3 FB Neck ROM: Full    Dental  (+) Edentulous Upper, Edentulous Lower   Pulmonary asthma , pneumonia, resolved, COPD,  COPD inhaler,    breath sounds clear to auscultation + decreased breath sounds      Cardiovascular hypertension, Pt. on medications + Peripheral Vascular Disease  + dysrhythmias Atrial Fibrillation  Rhythm:Irregular Rate:Normal  Chronic A. Fib since 1994   Neuro/Psych  Headaches, PSYCHIATRIC DISORDERS Anxiety Depression HOH Diabetic neuropathy  Neuromuscular disease    GI/Hepatic GERD  Medicated and Controlled,S/P Gastric Sleeve Hx/o Diverticulosis Hx/o IBS   Endo/Other  diabetes, Poorly Controlled, Type 2, Oral Hypoglycemic AgentsHyperlipidemia  Renal/GU Renal diseaseHx/o renal calculi   ED    Musculoskeletal  (+) Arthritis , Osteoarthritis,  Fibromyalgia -OA with DJD right knee   Abdominal   Peds  Hematology  (+) anemia , Coumadin - last dose 5 days ago   Anesthesia Other Findings   Reproductive/Obstetrics                           Anesthesia Physical Anesthesia Plan  ASA: III  Anesthesia Plan: Spinal   Post-op Pain Management:  Regional for Post-op pain   Induction:   PONV Risk Score and Plan: 2 and Propofol infusion, Ondansetron, Treatment may vary due to age or medical condition and Midazolam  Airway Management Planned: Natural Airway and Simple Face Mask  Additional Equipment:   Intra-op Plan:   Post-operative Plan:   Informed Consent: I have reviewed the patients History and Physical, chart, labs and discussed the procedure including the risks, benefits and alternatives for the  proposed anesthesia with the patient or authorized representative who has indicated his/her understanding and acceptance.   Dental advisory given  Plan Discussed with: CRNA, Anesthesiologist and Surgeon  Anesthesia Plan Comments:        Anesthesia Quick Evaluation

## 2017-04-27 NOTE — Interval H&P Note (Signed)
History and Physical Interval Note:  04/27/2017 9:28 AM  [name]  has presented today for surgery, with the diagnosis of RIGHT KNEE DEGENERATIVE JOINT DISEASE  The various methods of treatment have been discussed with the patient and family. After consideration of risks, benefits and other options for treatment, the patient has consented to  Procedure(s): TOTAL KNEE ARTHROPLASTY (Right) as a surgical intervention .  The patient's history has been reviewed, patient examined, no change in status, stable for surgery.  I have reviewed the patient's chart and labs.  Questions were answered to the patient's satisfaction.     Hesham Womac G

## 2017-04-27 NOTE — Progress Notes (Signed)
ANTICOAGULATION CONSULT NOTE - Initial Consult  Pharmacy Consult for warfarin Indication: atrial fibrillation and VTE prophylaxis  Allergies  Allergen Reactions  . Sulfacetamide Sodium Swelling    throat swelling  . Latex Itching  . Adhesive [Tape] Other (See Comments)    Tears skin-- "No tape of any kind, they all tear skin right off" Tears skin-- "No tape of any kind, they all tear skin right off"    Patient Measurements: Height: 6\' 1"  (185.4 cm) Weight: 198 lb 1.6 oz (89.9 kg) IBW/kg (Calculated) : 79.9  Vital Signs: Temp: 97.9 F (36.6 C) (03/19 1245) Temp Source: Oral (03/19 0826) BP: 125/80 (03/19 1427) Pulse Rate: 67 (03/19 1430)  Labs: Recent Labs    04/27/17 0828  APTT 31  LABPROT 14.7  INR 1.16    Estimated Creatinine Clearance: 48.1 mL/min (A) (by C-G formula based on SCr of 1.5 mg/dL (H)).   Medical History: Past Medical History:  Diagnosis Date  . Arthritis    hands   . Asthma   . Cancer ([place])    SKIN CANCER REMOVED  . Chronic atrial fibrillation (HCC)    a.fib on warfarin- Dr. Percival Spanish follows  . Diverticulosis of colon (without mention of hemorrhage)   . Dysrhythmia    a fib  . ED (erectile dysfunction)   . Fibromyalgia   . GERD (gastroesophageal reflux disease)   . Hard of hearing    wears hearing aids  . Headache   . History of blood clots    behind right knee and then went into left lung 22yrs ago  . History of colon polyps   . History of kidney stones   . Hx of diabetes mellitus    "no longer diabetic since gastric bypass" - no longer taking metformin"  in 2 months "problems with low blood sugar now"  . Hx of gout    but doesn't take any meds  . Inflammation of colonic mucosa    recent admission and release from [facility] 02-28-15-remains on oral antibiotic  . Obesity   . Peripheral vascular disease ([place])   . Pneumonia    last time about 71yrs ago  . Pulmonary embolism ([place])    over 10 yrs ago "many years ago"  . Restless legs  syndrome (RLS)   . Unspecified asthma(493.90)    as a child  . Unspecified essential hypertension    hx of no longer on medication due to gastric bypass     Medications:  Medications Prior to Admission  Medication Sig Dispense Refill Last Dose  . atorvastatin (LIPITOR) 80 MG tablet Take 1 tablet (80 mg total) by mouth at bedtime. 90 tablet 3 04/26/2017 at Unknown time  . cyclobenzaprine (FLEXERIL) 10 MG tablet TAKE 1 TABLET BY ORAL ROUTE 2 TIMES EVERY DAY AS NEEDED FOR MUSCLE SPASMS/TIGHTNESS  0 Taking  . FLUoxetine (PROZAC) 40 MG capsule Take 1 capsule (40 mg total) by mouth daily. 90 capsule 1 04/26/2017 at Unknown time  . furosemide (LASIX) 20 MG tablet Take 1-2 tablets (20-40 mg total) by mouth daily. (Patient taking differently: Take 40 mg by mouth daily. ) 180 tablet 1 04/26/2017 at Unknown time  . gabapentin (NEURONTIN) 300 MG capsule Take 1 capsule (300 mg total) by mouth 2 (two) times daily. 180 capsule 0 04/26/2017 at Unknown time  . hydrocortisone 2.5 % cream APPLICATIONS APPLY ON THE SKIN APPLY ON THE SKIN TWICE A DAY AS NEEDED  0 Past Week at Unknown time  . lansoprazole (PREVACID) 30  MG capsule TAKE 1 CAPSULE (30 MG TOTAL) BY MOUTH ONCE DAILY.  3 Taking  . lidocaine (XYLOCAINE) 5 % ointment APPLY 1 GRAM ( 1 GRAM = 1 INCH) TO THE AFFECTED AREA 2 TIMES A DAY  12 Past Week at Unknown time  . lisinopril (PRINIVIL,ZESTRIL) 2.5 MG tablet Take 2.5 mg by mouth daily.    04/26/2017 at Unknown time  . mometasone (ASMANEX) 220 MCG/INH inhaler Inhale 1 puff into the lungs 2 (two) times daily as needed.    04/26/2017 at Unknown time  . nystatin cream (MYCOSTATIN) APPLY TO SKIN TWICE DAILY AS DIRECTED AS NEEDED  2 Past Week at Unknown time  . omeprazole (PRILOSEC) 40 MG capsule Take 40 mg by mouth daily.   04/26/2017 at Unknown time  . primidone (MYSOLINE) 50 MG tablet 150 mg in the morning, 100 mg at night 450 tablet 1 04/26/2017 at Unknown time  . ranitidine (ZANTAC) 300 MG tablet Take 300 mg by  mouth at bedtime.    04/26/2017 at Unknown time  . silodosin (RAPAFLO) 8 MG CAPS capsule Take 8 mg by mouth daily with breakfast.    04/26/2017 at Unknown time  . sucralfate (CARAFATE) 1 g tablet Take 1 g by mouth daily before breakfast.   04/26/2017 at Unknown time  . tamsulosin (FLOMAX) 0.4 MG CAPS capsule Take 0.4 mg by mouth daily.    04/26/2017 at Unknown time  . warfarin (COUMADIN) 2.5 MG tablet TAKE 1 TO 1.5 TABLETS BY  MOUTH DAILY OR AS DIRECTED  BY COUMADIN CLINIC (Patient taking differently: Take 2.5-3.75 mg by mouth See admin instructions. TAKE 1 TO 1.5 TABLETS BY  MOUTH DAILY OR AS DIRECTED  BY COUMADIN CLINIC 2.5 Sunday and Thursday, all other days is 3.75 mg) 120 tablet 0 Past Week at Unknown time  . zolpidem (AMBIEN) 10 MG tablet TAKE 1 TABLET BY MOUTH AT BEDTIME AS NEEDED 30 tablet 1 04/26/2017 at Unknown time    Assessment: 77 y/o male on warfarin PTA for hx Afib s/p right TKR. Pharmacy consulted to resume warfarin post-op. INR is subtherapeutic at 1.16. Pre-op CBC is normal.  PTA: 3.75 mg daily except 2.5 mg Thu and Sun  Goal of Therapy:  INR 2-3 Monitor platelets by anticoagulation protocol: Yes   Plan:  - Warfarin 5 mg PO tonight - Daily INR - Monitor for s/sx of bleeding   Renold Genta, PharmD, BCPS Clinical Pharmacist Clinical phone for 04/27/2017 until 10p is [phone] After 10p, please call [facility] at [phone] for assistance 04/27/2017 2:52 PM

## 2017-04-27 NOTE — Evaluation (Signed)
Physical Therapy Evaluation Patient Details Name: [name] MRN: [id number] DOB: 02-Dec-1940 Today's Date: 04/27/2017   History of Present Illness  77 y.o. male admitted on 04/27/17 for elective R TKA.  Pt with significant PMH of essential HTN, RLS, PE, PVD, obesity, fibromyalgia, dysrhythmia, CA, L TSA, L TKA, multiple back and cervical spine surgery.    Clinical Impression  Pt is POD #0 and is moving very well, ambulated into the hallway with RW and min guard assist.  Wife present and supportive.  Pt will be able to d/c home if he continues to progress well with wife's assist/supervision.   PT to follow acutely for deficits listed below.     Follow Up Recommendations Follow surgeon's recommendation for DC plan and follow-up therapies;Supervision for mobility/OOB    Equipment Recommendations  Rolling walker with 5" wheels;3in1 (PT)    Recommendations for Other Services    NA    Precautions / Restrictions Precautions Precautions: Knee Precaution Booklet Issued: Yes (comment) Precaution Comments: knee exercise handout given  Required Braces or Orthoses: Knee Immobilizer - Right(until discontinued) Restrictions Weight Bearing Restrictions: Yes RLE Weight Bearing: Weight bearing as tolerated      Mobility  Bed Mobility Overal bed mobility: Modified Independent                Transfers Overall transfer level: Needs assistance Equipment used: Rolling walker (2 wheeled) Transfers: Sit to/from Stand Sit to Stand: Min guard         General transfer comment: Min guard assist for safety, cues to stand a minute to assess for dizziness.   Ambulation/Gait Ambulation/Gait assistance: Min guard Ambulation Distance (Feet): 75 Feet Assistive device: Rolling walker (2 wheeled) Gait Pattern/deviations: Step-through pattern;Antalgic     General Gait Details: Pt with mildly antalgic gait pattern, cues to slow down.           Balance Overall balance assessment: Needs  assistance Sitting-balance support: Feet supported;No upper extremity supported Sitting balance-Leahy Scale: Good     Standing balance support: Bilateral upper extremity supported;No upper extremity supported;Single extremity supported Standing balance-Leahy Scale: Fair                               Pertinent Vitals/Pain Pain Assessment: No/denies pain    Home Living Family/patient expects to be discharged to:: Private residence Living Arrangements: Spouse/significant other Available Help at Discharge: Family Type of Home: House Home Access: Stairs to enter Entrance Stairs-Rails: Left Entrance Stairs-Number of Steps: 2 Home Layout: One level Home Equipment: Shower seat - built in;Hand held shower head      Prior Function Level of Independence: Independent         Comments: works driving.         Extremity/Trunk Assessment   Upper Extremity Assessment Upper Extremity Assessment: Overall WFL for tasks assessed    Lower Extremity Assessment Lower Extremity Assessment: RLE deficits/detail RLE Deficits / Details: pt able to lift right leg against gravity in bed (hip flexion 3/5) and knee 3-/5, ankle at least 4/5 per gross functional assessment.  RLE Sensation: decreased light touch(thigh is still a bit numb)    Cervical / Trunk Assessment Cervical / Trunk Assessment: Other exceptions Cervical / Trunk Exceptions: h/o back surgery and neck surgery  Communication   Communication: No difficulties  Cognition Arousal/Alertness: Awake/alert Behavior During Therapy: WFL for tasks assessed/performed Overall Cognitive Status: Within Functional Limits for tasks assessed  Exercises Total Joint Exercises Ankle Circles/Pumps: AROM;Both;20 reps   Assessment/Plan    PT Assessment Patient needs continued PT services  PT Problem List Decreased strength;Decreased range of motion;Decreased activity  tolerance;Decreased balance;Decreased mobility;Decreased knowledge of use of DME;Pain;Decreased knowledge of precautions       PT Treatment Interventions Gait training;DME instruction;Stair training;Functional mobility training;Therapeutic exercise;Therapeutic activities;Balance training;Patient/family education;Manual techniques;Modalities    PT Goals (Current goals can be found in the Care Plan section)  Acute Rehab PT Goals Patient Stated Goal: to get back to work in 6 weeks PT Goal Formulation: With patient Time For Goal Achievement: 05/04/17 Potential to Achieve Goals: Good    Frequency 7X/week           AM-PAC PT "6 Clicks" Daily Activity  Outcome Measure Difficulty turning over in bed (including adjusting bedclothes, sheets and blankets)?: None Difficulty moving from lying on back to sitting on the side of the bed? : None Difficulty sitting down on and standing up from a chair with arms (e.g., wheelchair, bedside commode, etc,.)?: None Help needed moving to and from a bed to chair (including a wheelchair)?: A Little Help needed walking in hospital room?: A Little Help needed climbing 3-5 steps with a railing? : A Little 6 Click Score: 21    End of Session Equipment Utilized During Treatment: Right knee immobilizer Activity Tolerance: Patient tolerated treatment well Patient left: in chair;with call bell/phone within reach;with family/visitor present Nurse Communication: Mobility status PT Visit Diagnosis: Muscle weakness (generalized) (M62.81);Difficulty in walking, not elsewhere classified (R26.2);Pain Pain - Right/Left: Right Pain - part of body: Knee    Time: 6415-8309 PT Time Calculation (min) (ACUTE ONLY): 25 min   Charges:         Wells Guiles B. [place] Pinkett, PT, DPT [phone]    PT Evaluation $PT Eval Moderate Complexity: 1 Mod PT Treatments $Gait Training: 8-22 mins   04/27/2017, 6:03 PM

## 2017-04-28 ENCOUNTER — Encounter (HOSPITAL_COMMUNITY): Payer: Self-pay | Admitting: Orthopaedic Surgery

## 2017-04-28 LAB — PROTIME-INR
INR: 1.06
PROTHROMBIN TIME: 13.7 s (ref 11.4–15.2)

## 2017-04-28 MED ORDER — CHLORHEXIDINE GLUCONATE CLOTH 2 % EX PADS
6.0000 | MEDICATED_PAD | Freq: Every day | CUTANEOUS | Status: DC
  Administered 2017-04-29: 6 via TOPICAL

## 2017-04-28 MED ORDER — WARFARIN SODIUM 5 MG PO TABS
5.0000 mg | ORAL_TABLET | Freq: Once | ORAL | Status: AC
Start: 2017-04-28 — End: 2017-04-28
  Administered 2017-04-28: 5 mg via ORAL
  Filled 2017-04-28: qty 1

## 2017-04-28 MED ORDER — SODIUM CHLORIDE 0.9 % IV BOLUS (SEPSIS)
1000.0000 mL | Freq: Once | INTRAVENOUS | Status: AC
  Administered 2017-04-29: 1000 mL via INTRAVENOUS

## 2017-04-28 MED ORDER — MUPIROCIN 2 % EX OINT
1.0000 "application " | TOPICAL_OINTMENT | Freq: Two times a day (BID) | CUTANEOUS | Status: DC
  Administered 2017-04-28 – 2017-04-29 (×2): 1 via NASAL
  Filled 2017-04-28 (×2): qty 22

## 2017-04-28 NOTE — Progress Notes (Signed)
Physical Therapy Treatment Patient Details Name: [name] MRN: [id number] DOB: Sep 12, 1940 Today's Date: 04/28/2017    History of Present Illness 77 y.o. male admitted on 04/27/17 for elective R TKA.  Pt with significant PMH of essential HTN, RLS, PE, PVD, obesity, fibromyalgia, dysrhythmia, CA, L TSA, L TKA, multiple back and cervical spine surgery.      PT Comments    POD #1 pt is moving very well. I d/c's his KI as his SLR remains strong.  Pt able to practice stairs and review his entire HEP program.  Will walk further this PM, review exercises again, and measure knee ROM.  Per pt he will be here at least until tomorrow.     Follow Up Recommendations  Follow surgeon's recommendation for DC plan and follow-up therapies;Supervision for mobility/OOB     Equipment Recommendations  Rolling walker with 5" wheels;3in1 (PT)    Recommendations for Other Services   NA     Precautions / Restrictions Precautions Precautions: Knee Precaution Booklet Issued: Yes (comment) Precaution Comments: knee exercise handout given precaution reviewed Required Braces or Orthoses: (d/c as he still has a great SLR) Restrictions RLE Weight Bearing: Weight bearing as tolerated    Mobility  Bed Mobility               General bed mobility comments: Pt OOB in the recliner chair.   Transfers Overall transfer level: Needs assistance Equipment used: Rolling walker (2 wheeled) Transfers: Sit to/from Stand Sit to Stand: Supervision         General transfer comment: supervision for safety  Ambulation/Gait Ambulation/Gait assistance: Supervision Ambulation Distance (Feet): 150 Feet Assistive device: Rolling walker (2 wheeled) Gait Pattern/deviations: Step-through pattern;Antalgic     General Gait Details: Pt with mildly antalgic gait pattern, verbal cues for upright posture and slower speed. No signs of bucklin.    Stairs Stairs: Yes   Stair Management: Two rails;Step to  pattern;Alternating pattern;Forwards Number of Stairs: 5(x2) General stair comments: Verbal cues for correct LE sequencing      Balance Overall balance assessment: Needs assistance Sitting-balance support: Feet supported;No upper extremity supported Sitting balance-Leahy Scale: Good     Standing balance support: No upper extremity supported;Bilateral upper extremity supported;Single extremity supported Standing balance-Leahy Scale: Fair                              Cognition Arousal/Alertness: Awake/alert Behavior During Therapy: WFL for tasks assessed/performed Overall Cognitive Status: Within Functional Limits for tasks assessed                                        Exercises Total Joint Exercises Ankle Circles/Pumps: AROM;Both;20 reps Quad Sets: AROM;Right;10 reps Towel Squeeze: AROM;Both;10 reps Short Arc Quad: AROM;Right;10 reps Heel Slides: AROM;Right;10 reps Hip ABduction/ADduction: AROM;Right;10 reps Straight Leg Raises: AROM;Right;10 reps Long Arc Quad: AROM;Right;10 reps Knee Flexion: AROM;Right;10 reps;Seated        Pertinent Vitals/Pain Pain Assessment: No/denies pain           PT Goals (current goals can now be found in the care plan section) Acute Rehab PT Goals Patient Stated Goal: to get back to work in 6 weeks Progress towards PT goals: Progressing toward goals    Frequency    7X/week      PT Plan Current plan remains appropriate  AM-PAC PT "6 Clicks" Daily Activity  Outcome Measure  Difficulty turning over in bed (including adjusting bedclothes, sheets and blankets)?: None Difficulty moving from lying on back to sitting on the side of the bed? : None Difficulty sitting down on and standing up from a chair with arms (e.g., wheelchair, bedside commode, etc,.)?: None Help needed moving to and from a bed to chair (including a wheelchair)?: None Help needed walking in hospital room?: None Help needed  climbing 3-5 steps with a railing? : None 6 Click Score: 24    End of Session   Activity Tolerance: Patient tolerated treatment well Patient left: in chair;with call bell/phone within reach   PT Visit Diagnosis: Muscle weakness (generalized) (M62.81);Difficulty in walking, not elsewhere classified (R26.2);Pain Pain - Right/Left: Right Pain - part of body: Knee     Time: 9292-4462 PT Time Calculation (min) (ACUTE ONLY): 23 min  Charges:  $Gait Training: 8-22 mins $Therapeutic Exercise: 8-22 mins          Terron Merfeld B. [place], [place], DPT [phone]            04/28/2017, 11:07 AM

## 2017-04-28 NOTE — Discharge Instructions (Signed)

## 2017-04-28 NOTE — Care Plan (Signed)
Spoke with patient's wife prior to surgery. He will discharge to home with her and HHPT provided by [facility]. He will have 5 HHPT visits and transition to OPPT at [facility] on 05/07/17 @ 1040. He is scheduled to follow up with Dr. Rhona Raider on 05/07/17 @ 900. He equipment has been ordered from [facility] prior to surgery.   Please contact Ladell Heads, RNCM at [phone] with questions or if plan should need to change.   Thanks

## 2017-04-28 NOTE — Progress Notes (Signed)
Subjective: 1 Day Post-Op Procedure(s) (LRB): TOTAL KNEE ARTHROPLASTY (Right)  Activity level:  wabt Diet tolerance:  ok Voiding:  ok Patient reports pain as mild.    Objective: Vital signs in last 24 hours: Temp:  [97.6 F (36.4 C)-98.2 F (36.8 C)] 97.7 F (36.5 C) (03/20 0318) Pulse Rate:  [56-98] 98 (03/20 0318) Resp:  [10-23] 18 (03/20 0318) BP: (88-133)/(58-91) 88/58 (03/20 0318) SpO2:  [94 %-100 %] 98 % (03/20 0318) Weight:  [89.9 kg (198 lb 1.6 oz)] 89.9 kg (198 lb 1.6 oz) (03/19 0826)  Labs: Recent Labs    04/27/17 1638  HGB 12.2*   Recent Labs    04/27/17 1638  WBC 6.7  RBC 3.92*  HCT 37.2*  PLT 193   Recent Labs    04/27/17 1638  CREATININE 1.60*   Recent Labs    04/27/17 0828 04/28/17 0559  INR 1.16 1.06    Physical Exam:  Neurologically intact ABD soft Neurovascular intact Sensation intact distally Intact pulses distally Dorsiflexion/Plantar flexion intact Incision: dressing C/D/I and no drainage No cellulitis present Compartment soft  Assessment/Plan:  1 Day Post-Op Procedure(s) (LRB): TOTAL KNEE ARTHROPLASTY (Right) Advance diet Up with therapy D/C IV fluids Plan for discharge tomorrow Discharge home with home health  He would like to go home today but INR only 1.06 so we will keep him one more day until he is therapeutic on his coumadin. Follow up in office 2 weeks post op.  Anticipated LOS equal to or greater than 2 midnights due to - Age 77 and older with one or more of the following:  - Obesity  - Expected need for hospital services (PT, OT, Nursing) required for safe  discharge  - Anticipated need for postoperative skilled nursing care or inpatient rehab  - Active co-morbidities: Cardiac Arrhythmia and history of PE OR   - Unanticipated findings during/Post Surgery: None  - Patient is a high risk of re-admission due to: home coumadin and currently INR of 1.06 so he will stay one more night to get back to theraputic  INR    [name], [name] 04/28/2017, 8:17 AM

## 2017-04-28 NOTE — Plan of Care (Signed)
  Progressing Education: Knowledge of General Education information will improve 04/28/2017 1355 - Progressing by Rance Muir, RN Knowledge of General Education information will improve 04/28/2017 1355 - Progressing by Rance Muir, RN Health Behavior/Discharge Planning: Ability to manage health-related needs will improve 04/28/2017 1355 - Progressing by Rance Muir, RN Clinical Measurements: Ability to maintain clinical measurements within normal limits will improve 04/28/2017 1355 - Progressing by Rance Muir, RN Will remain free from infection 04/28/2017 1355 - Progressing by Rance Muir, RN Diagnostic test results will improve 04/28/2017 1355 - Progressing by Rance Muir, RN Respiratory complications will improve 04/28/2017 1355 - Progressing by Rance Muir, RN Cardiovascular complication will be avoided 04/28/2017 1355 - Progressing by Rance Muir, RN Activity: Risk for activity intolerance will decrease 04/28/2017 1355 - Progressing by Rance Muir, RN Nutrition: Adequate nutrition will be maintained 04/28/2017 1355 - Progressing by Rance Muir, RN Coping: Level of anxiety will decrease 04/28/2017 1355 - Progressing by Rance Muir, RN Elimination: Will not experience complications related to bowel motility 04/28/2017 1355 - Progressing by Rance Muir, RN Will not experience complications related to urinary retention 04/28/2017 1355 - Progressing by Rance Muir, RN Pain Managment: General experience of comfort will improve 04/28/2017 1355 - Progressing by Rance Muir, RN Safety: Ability to remain free from injury will improve 04/28/2017 1355 - Progressing by Rance Muir, RN Skin Integrity: Risk for impaired skin integrity will decrease 04/28/2017 1355 - Progressing by Rance Muir, RN

## 2017-04-28 NOTE — Progress Notes (Addendum)
BP soft at 87/54. Patient has no signs/symptoms low BP. Paged on-call Danielle, [place] at [phone]. 1L NS bolus ordered. Continue to monitor.

## 2017-04-28 NOTE — Progress Notes (Signed)
Physical Therapy Treatment Patient Details Name: [name] MRN: [id number] DOB: 12-11-1940 Today's Date: 04/28/2017    History of Present Illness 77 y.o. male admitted on 04/27/17 for elective R TKA.  Pt with significant PMH of essential HTN, RLS, PE, PVD, obesity, fibromyalgia, dysrhythmia, CA, L TSA, L TKA, multiple back and cervical spine surgery.      PT Comments    Pt is POD #1 and is progressing well with gait.  We even tried single point cane (not quite ready for it) and then switched back to RW for safety.  He continues stair training this PM and preformed his exercises again.  PT will continue to follow acutely to progress safe mobility.    Follow Up Recommendations  Follow surgeon's recommendation for DC plan and follow-up therapies;Supervision for mobility/OOB     Equipment Recommendations  Rolling walker with 5" wheels;3in1 (PT)    Recommendations for Other Services    NA    Precautions / Restrictions Precautions Precautions: Knee Precaution Booklet Issued: Yes (comment) Precaution Comments: knee exercise handout given  Required Braces or Orthoses: Knee Immobilizer - Right(until discontinued) Restrictions RLE Weight Bearing: Weight bearing as tolerated    Mobility  Bed Mobility Overal bed mobility: Modified Independent                Transfers Overall transfer level: Needs assistance Equipment used: Rolling walker (2 wheeled) Transfers: Sit to/from Stand Sit to Stand: Supervision         General transfer comment: Supervision for safety  Ambulation/Gait Ambulation/Gait assistance: Min guard;Min assist Ambulation Distance (Feet): 200 Feet Assistive device: Rolling walker (2 wheeled);Straight cane Gait Pattern/deviations: Step-through pattern;Antalgic     General Gait Details: Pt with mildly antalgic gait pattern, cues to slow down.  Attempted gait around room with cane, but pt not quite ready yet, requiring min assist for balance and  buckling a bit more over his right leg.    Stairs Stairs: Yes   Stair Management: Two rails;Step to pattern;Alternating pattern;Forwards Number of Stairs: 5(x2) General stair comments: Verbal cues for correct LE sequencing.  Pt could report it to me, but when he got on the stairs he kept reverting back to reciprocal pattern.          Balance Overall balance assessment: Needs assistance Sitting-balance support: Feet supported;No upper extremity supported Sitting balance-Leahy Scale: Good     Standing balance support: Bilateral upper extremity supported;No upper extremity supported;Single extremity supported Standing balance-Leahy Scale: Good                              Cognition Arousal/Alertness: Awake/alert Behavior During Therapy: WFL for tasks assessed/performed Overall Cognitive Status: Within Functional Limits for tasks assessed                                        Exercises Total Joint Exercises Ankle Circles/Pumps: AROM;Both;20 reps Quad Sets: AROM;Right;10 reps Towel Squeeze: AROM;Both;10 reps Short Arc Quad: AROM;Right;10 reps Heel Slides: AROM;Right;10 reps Hip ABduction/ADduction: AROM;Right;10 reps Straight Leg Raises: AROM;Right;10 reps Long Arc Quad: AROM;Right;10 reps Knee Flexion: AROM;Right;10 reps;Seated Goniometric ROM: 0-95        Pertinent Vitals/Pain Pain Assessment: Faces Faces Pain Scale: Hurts little more Pain Location: R knee with ROM Pain Descriptors / Indicators: Sore Pain Intervention(s): Limited activity within patient's tolerance;Monitored during session;Repositioned  PT Goals (current goals can now be found in the care plan section) Acute Rehab PT Goals Patient Stated Goal: to get back to work in 6 weeks Progress towards PT goals: Progressing toward goals    Frequency    7X/week      PT Plan Current plan remains appropriate       AM-PAC PT "6 Clicks" Daily Activity   Outcome Measure  Difficulty turning over in bed (including adjusting bedclothes, sheets and blankets)?: None Difficulty moving from lying on back to sitting on the side of the bed? : None Difficulty sitting down on and standing up from a chair with arms (e.g., wheelchair, bedside commode, etc,.)?: None Help needed moving to and from a bed to chair (including a wheelchair)?: A Little Help needed walking in hospital room?: A Little Help needed climbing 3-5 steps with a railing? : A Little 6 Click Score: 21    End of Session Equipment Utilized During Treatment: Right knee immobilizer Activity Tolerance: Patient tolerated treatment well Patient left: in chair;with call bell/phone within reach;with family/visitor present Nurse Communication: Mobility status PT Visit Diagnosis: Muscle weakness (generalized) (M62.81);Difficulty in walking, not elsewhere classified (R26.2);Pain Pain - Right/Left: Right Pain - part of body: Knee     Time: 0354-6568 PT Time Calculation (min) (ACUTE ONLY): 33 min  Charges:  $Gait Training: 8-22 mins $Therapeutic Exercise: 8-22 mins       [name] B. [place], [place], DPT [phone]         04/28/2017, 5:39 PM

## 2017-04-28 NOTE — Progress Notes (Signed)
ANTICOAGULATION CONSULT NOTE - Follow Up Consult  Pharmacy Consult for warfarin Indication: atrial fibrillation and VTE prophylaxis  Allergies  Allergen Reactions  . Sulfacetamide Sodium Swelling    throat swelling  . Latex Itching  . Adhesive [Tape] Other (See Comments)    Tears skin-- "No tape of any kind, they all tear skin right off" Tears skin-- "No tape of any kind, they all tear skin right off"    Patient Measurements: Height: 6\' 1"  (185.4 cm) Weight: 198 lb 1.6 oz (89.9 kg) IBW/kg (Calculated) : 79.9  Vital Signs: Temp: 97.7 F (36.5 C) (03/20 0318) Temp Source: Oral (03/20 0318) BP: 88/58 (03/20 0318) Pulse Rate: 98 (03/20 0318)  Labs: Recent Labs    04/27/17 0828 04/27/17 1638 04/28/17 0559  HGB  --  12.2*  --   HCT  --  37.2*  --   PLT  --  193  --   APTT 31  --   --   LABPROT 14.7  --  13.7  INR 1.16  --  1.06  CREATININE  --  1.60*  --     Estimated Creatinine Clearance: 45.1 mL/min (A) (by C-G formula based on SCr of 1.6 mg/dL (H)).   Medical History: Past Medical History:  Diagnosis Date  . Arthritis    hands   . Asthma   . Cancer ([place])    SKIN CANCER REMOVED  . Chronic atrial fibrillation ([facility])    a.fib on warfarin- Dr. Percival Spanish follows  . Diverticulosis of colon (without mention of hemorrhage)   . Dysrhythmia    a fib  . ED (erectile dysfunction)   . Fibromyalgia   . GERD (gastroesophageal reflux disease)   . Hard of hearing    wears hearing aids  . Headache   . History of blood clots    behind right knee and then went into left lung 18yrs ago  . History of colon polyps   . History of kidney stones   . Hx of diabetes mellitus    "no longer diabetic since gastric bypass" - no longer taking metformin"  in 2 months "problems with low blood sugar now"  . Hx of gout    but doesn't take any meds  . Inflammation of colonic mucosa    recent admission and release from [facility] 02-28-15-remains on oral antibiotic  . Obesity   . Peripheral  vascular disease ([place])   . Pneumonia    last time about 101yrs ago  . Pulmonary embolism ([place])    over 10 yrs ago "many years ago"  . Restless legs syndrome (RLS)   . Unspecified asthma(493.90)    as a child  . Unspecified essential hypertension    hx of no longer on medication due to gastric bypass     Medications:  Medications Prior to Admission  Medication Sig Dispense Refill Last Dose  . atorvastatin (LIPITOR) 80 MG tablet Take 1 tablet (80 mg total) by mouth at bedtime. 90 tablet 3 04/26/2017 at Unknown time  . cyclobenzaprine (FLEXERIL) 10 MG tablet TAKE 1 TABLET BY ORAL ROUTE 2 TIMES EVERY DAY AS NEEDED FOR MUSCLE SPASMS/TIGHTNESS  0 Taking  . FLUoxetine (PROZAC) 40 MG capsule Take 1 capsule (40 mg total) by mouth daily. 90 capsule 1 04/26/2017 at Unknown time  . furosemide (LASIX) 20 MG tablet Take 1-2 tablets (20-40 mg total) by mouth daily. (Patient taking differently: Take 40 mg by mouth daily. ) 180 tablet 1 04/26/2017 at Unknown time  . gabapentin (  NEURONTIN) 300 MG capsule Take 1 capsule (300 mg total) by mouth 2 (two) times daily. 180 capsule 0 04/26/2017 at Unknown time  . hydrocortisone 2.5 % cream APPLICATIONS APPLY ON THE SKIN APPLY ON THE SKIN TWICE A DAY AS NEEDED  0 Past Week at Unknown time  . lansoprazole (PREVACID) 30 MG capsule TAKE 1 CAPSULE (30 MG TOTAL) BY MOUTH ONCE DAILY.  3 Taking  . lidocaine (XYLOCAINE) 5 % ointment APPLY 1 GRAM ( 1 GRAM = 1 INCH) TO THE AFFECTED AREA 2 TIMES A DAY  12 Past Week at Unknown time  . lisinopril (PRINIVIL,ZESTRIL) 2.5 MG tablet Take 2.5 mg by mouth daily.    04/26/2017 at Unknown time  . mometasone (ASMANEX) 220 MCG/INH inhaler Inhale 1 puff into the lungs 2 (two) times daily as needed.    04/26/2017 at Unknown time  . nystatin cream (MYCOSTATIN) APPLY TO SKIN TWICE DAILY AS DIRECTED AS NEEDED  2 Past Week at Unknown time  . omeprazole (PRILOSEC) 40 MG capsule Take 40 mg by mouth daily.   04/26/2017 at Unknown time  . primidone  (MYSOLINE) 50 MG tablet 150 mg in the morning, 100 mg at night 450 tablet 1 04/26/2017 at Unknown time  . ranitidine (ZANTAC) 300 MG tablet Take 300 mg by mouth at bedtime.    04/26/2017 at Unknown time  . silodosin (RAPAFLO) 8 MG CAPS capsule Take 8 mg by mouth daily with breakfast.    04/26/2017 at Unknown time  . sucralfate (CARAFATE) 1 g tablet Take 1 g by mouth daily before breakfast.   04/26/2017 at Unknown time  . tamsulosin (FLOMAX) 0.4 MG CAPS capsule Take 0.4 mg by mouth daily.    04/26/2017 at Unknown time  . warfarin (COUMADIN) 2.5 MG tablet TAKE 1 TO 1.5 TABLETS BY  MOUTH DAILY OR AS DIRECTED  BY COUMADIN CLINIC (Patient taking differently: Take 2.5-3.75 mg by mouth See admin instructions. TAKE 1 TO 1.5 TABLETS BY  MOUTH DAILY OR AS DIRECTED  BY COUMADIN CLINIC 2.5 Sunday and Thursday, all other days is 3.75 mg) 120 tablet 0 Past Week at Unknown time  . zolpidem (AMBIEN) 10 MG tablet TAKE 1 TABLET BY MOUTH AT BEDTIME AS NEEDED 30 tablet 1 04/26/2017 at Unknown time    Assessment: 77 y/o male on warfarin PTA for hx Afib s/p right TKR. Pharmacy consulted to resume warfarin post-op. INR remains subtherapeutic at 1.06. Likely will take 3-5 days for INR to start trending up appropriately.   PTA: 3.75 mg daily except 2.5 mg Thu and Sun  Goal of Therapy:  INR 2-3 Monitor platelets by anticoagulation protocol: Yes   Plan:  - Warfarin 5 mg PO tonight - Lovenox 30 mg SQ BID (will d/c when INR is therapeutic) - Daily INR - Monitor for s/sx of bleeding - If planning to discharge tomorrow, would recommend discharge on 5 mg daily with f/u INR check on Friday    Albertina Parr, PharmD., BCPS Clinical Pharmacist Clinical phone for 04/28/17 until 3:30pm: [phone] If after 3:30pm, please call main pharmacy at: [phone]

## 2017-04-29 LAB — PROTIME-INR
INR: 1.51
Prothrombin Time: 18.1 seconds — ABNORMAL HIGH (ref 11.4–15.2)

## 2017-04-29 MED ORDER — TIZANIDINE HCL 4 MG PO TABS: 4.0000 mg | ORAL_TABLET | Freq: Four times a day (QID) | ORAL | 1 refills | Status: DC | PRN

## 2017-04-29 MED ORDER — BISACODYL 5 MG PO TBEC: 5.0000 mg | DELAYED_RELEASE_TABLET | Freq: Every day | ORAL | 0 refills | Status: DC | PRN

## 2017-04-29 MED ORDER — HYDROCODONE-ACETAMINOPHEN 7.5-325 MG PO TABS: 1.0000 | ORAL_TABLET | ORAL | 0 refills | Status: DC | PRN

## 2017-04-29 MED ORDER — DOCUSATE SODIUM 100 MG PO CAPS: 100.0000 mg | ORAL_CAPSULE | Freq: Two times a day (BID) | ORAL | 0 refills | Status: DC

## 2017-04-29 MED ORDER — WARFARIN SODIUM 3 MG PO TABS
3.0000 mg | ORAL_TABLET | Freq: Once | ORAL | Status: DC
  Filled 2017-04-29: qty 1

## 2017-04-29 MED ORDER — ENOXAPARIN SODIUM 30 MG/0.3ML ~~LOC~~ SOLN: 30.0000 mg | Freq: Two times a day (BID) | SUBCUTANEOUS | 0 refills | Status: DC

## 2017-04-29 NOTE — Discharge Summary (Signed)
Patient ID: [name] MRN: [id number] DOB/AGE: 76-05-1940 76 y.o.  Admit date: 04/27/2017 Discharge date: 04/29/2017  Admission Diagnoses:  Principal Problem:   Primary localized osteoarthritis of right knee Active Problems:   Primary osteoarthritis of right knee   Discharge Diagnoses:  Same  Past Medical History:  Diagnosis Date  . Arthritis    hands   . Asthma   . Cancer ([place])    SKIN CANCER REMOVED  . Chronic atrial fibrillation (HCC)    a.fib on warfarin- Dr. Percival Spanish follows  . Diverticulosis of colon (without mention of hemorrhage)   . Dysrhythmia    a fib  . ED (erectile dysfunction)   . Fibromyalgia   . GERD (gastroesophageal reflux disease)   . Hard of hearing    wears hearing aids  . Headache   . History of blood clots    behind right knee and then went into left lung 39yrs ago  . History of colon polyps   . History of kidney stones   . Hx of diabetes mellitus    "no longer diabetic since gastric bypass" - no longer taking metformin"  in 2 months "problems with low blood sugar now"  . Hx of gout    but doesn't take any meds  . Inflammation of colonic mucosa    recent admission and release from [facility] 02-28-15-remains on oral antibiotic  . Obesity   . Peripheral vascular disease ([place])   . Pneumonia    last time about 40yrs ago  . Pulmonary embolism ([place])    over 10 yrs ago "many years ago"  . Restless legs syndrome (RLS)   . Unspecified asthma(493.90)    as a child  . Unspecified essential hypertension    hx of no longer on medication due to gastric bypass     Surgeries: Procedure(s): TOTAL KNEE ARTHROPLASTY on 04/27/2017   Consultants:   Discharged Condition: Improved  Hospital Course: [name] is an 77 y.o. male who was admitted 04/27/2017 for operative treatment ofPrimary localized osteoarthritis of right knee. Patient has severe unremitting pain that affects sleep, daily activities, and work/hobbies. After pre-op clearance the  patient was taken to the operating room on 04/27/2017 and underwent  Procedure(s): TOTAL KNEE ARTHROPLASTY.    Patient was given perioperative antibiotics:  Anti-infectives (From admission, onward)   Start     Dose/Rate Route Frequency Ordered Stop   04/27/17 2130  vancomycin (VANCOCIN) IVPB 1000 mg/200 mL premix     1,000 mg 200 mL/hr over 60 Minutes Intravenous Every 12 hours 04/27/17 1613 04/27/17 2206   04/27/17 0900  vancomycin (VANCOCIN) IVPB 1000 mg/200 mL premix  Status:  Discontinued     1,000 mg 200 mL/hr over 60 Minutes Intravenous  Once 04/26/17 1004 04/26/17 1027   04/27/17 0900  ceFAZolin (ANCEF) IVPB 2g/100 mL premix     2 g 200 mL/hr over 30 Minutes Intravenous On call to O.R. 04/26/17 1004 04/27/17 1104   04/27/17 0900  vancomycin (VANCOCIN) IVPB 1000 mg/200 mL premix     1,000 mg 200 mL/hr over 60 Minutes Intravenous  Once 04/26/17 1027 04/27/17 0950       Patient was given sequential compression devices, early ambulation, and chemoprophylaxis to prevent DVT.  Patient benefited maximally from hospital stay and there were no complications.    Recent vital signs:  Patient Vitals for the past 24 hrs:  BP Temp Temp src Pulse Resp SpO2  04/29/17 0517 (!) 93/54 98.7 F (37.1 C) Oral 94  16 99 %  04/28/17 2249 (!) 87/54 - - - - -  04/28/17 2138 (!) 81/52 98.4 F (36.9 C) Oral 88 16 98 %     Recent laboratory studies:  Recent Labs    04/27/17 1638 04/28/17 0559 04/29/17 0703  WBC 6.7  --   --   HGB 12.2*  --   --   HCT 37.2*  --   --   PLT 193  --   --   CREATININE 1.60*  --   --   INR  --  1.06 1.51     Discharge Medications:   Allergies as of 04/29/2017      Reactions   Sulfacetamide Sodium Swelling   throat swelling   Latex Itching   Adhesive [tape] Other (See Comments)   Tears skin-- "No tape of any kind, they all tear skin right off" Tears skin-- "No tape of any kind, they all tear skin right off"      Medication List    TAKE these  medications   atorvastatin 80 MG tablet Commonly known as:  LIPITOR Take 1 tablet (80 mg total) by mouth at bedtime.   bisacodyl 5 MG EC tablet Commonly known as:  DULCOLAX Take 1 tablet (5 mg total) by mouth daily as needed for moderate constipation.   cyclobenzaprine 10 MG tablet Commonly known as:  FLEXERIL TAKE 1 TABLET BY ORAL ROUTE 2 TIMES EVERY DAY AS NEEDED FOR MUSCLE SPASMS/TIGHTNESS   docusate sodium 100 MG capsule Commonly known as:  COLACE Take 1 capsule (100 mg total) by mouth 2 (two) times daily.   enoxaparin 30 MG/0.3ML injection Commonly known as:  LOVENOX Inject 0.3 mLs (30 mg total) into the skin every 12 (twelve) hours.   FLUoxetine 40 MG capsule Commonly known as:  PROZAC Take 1 capsule (40 mg total) by mouth daily.   furosemide 20 MG tablet Commonly known as:  LASIX Take 1-2 tablets (20-40 mg total) by mouth daily. What changed:  how much to take   gabapentin 300 MG capsule Commonly known as:  NEURONTIN Take 1 capsule (300 mg total) by mouth 2 (two) times daily.   HYDROcodone-acetaminophen 7.5-325 MG tablet Commonly known as:  NORCO Take 1-2 tablets by mouth every 4 (four) hours as needed for moderate pain or severe pain.   hydrocortisone 2.5 % cream APPLICATIONS APPLY ON THE SKIN APPLY ON THE SKIN TWICE A DAY AS NEEDED   lansoprazole 30 MG capsule Commonly known as:  PREVACID TAKE 1 CAPSULE (30 MG TOTAL) BY MOUTH ONCE DAILY.   lidocaine 5 % ointment Commonly known as:  XYLOCAINE APPLY 1 GRAM ( 1 GRAM = 1 INCH) TO THE AFFECTED AREA 2 TIMES A DAY   lisinopril 2.5 MG tablet Commonly known as:  PRINIVIL,ZESTRIL Take 2.5 mg by mouth daily.   mometasone 220 MCG/INH inhaler Commonly known as:  ASMANEX Inhale 1 puff into the lungs 2 (two) times daily as needed.   nystatin cream Commonly known as:  MYCOSTATIN APPLY TO SKIN TWICE DAILY AS DIRECTED AS NEEDED   omeprazole 40 MG capsule Commonly known as:  PRILOSEC Take 40 mg by mouth daily.    primidone 50 MG tablet Commonly known as:  MYSOLINE 150 mg in the morning, 100 mg at night   ranitidine 300 MG tablet Commonly known as:  ZANTAC Take 300 mg by mouth at bedtime.   silodosin 8 MG Caps capsule Commonly known as:  RAPAFLO Take 8 mg by mouth daily with breakfast.  sucralfate 1 g tablet Commonly known as:  CARAFATE Take 1 g by mouth daily before breakfast.   tamsulosin 0.4 MG Caps capsule Commonly known as:  FLOMAX Take 0.4 mg by mouth daily.   tiZANidine 4 MG tablet Commonly known as:  ZANAFLEX Take 1 tablet (4 mg total) by mouth every 6 (six) hours as needed.   warfarin 2.5 MG tablet Commonly known as:  COUMADIN Take as directed. If you are unsure how to take this medication, talk to your nurse or doctor. Original instructions:  TAKE 1 TO 1.5 TABLETS BY  MOUTH DAILY OR AS DIRECTED  BY COUMADIN CLINIC What changed:    how much to take  how to take this  when to take this  additional instructions   zolpidem 10 MG tablet Commonly known as:  AMBIEN TAKE 1 TABLET BY MOUTH AT BEDTIME AS NEEDED            Durable Medical Equipment  (From admission, onward)        Start     Ordered   04/27/17 1614  DME Walker rolling  Once    Question:  Patient needs a walker to treat with the following condition  Answer:  Primary osteoarthritis of right knee   04/27/17 1613   04/27/17 1614  DME 3 n 1  Once     04/27/17 1613   04/27/17 1614  DME Bedside commode  Once    Question:  Patient needs a bedside commode to treat with the following condition  Answer:  Primary osteoarthritis of right knee   04/27/17 1613      Diagnostic Studies: No results found.  Disposition: Discharge disposition: 01-Home or Self Care       Discharge Instructions    Call MD / Call 911   Complete by:  As directed    If you experience chest pain or shortness of breath, CALL 911 and be transported to the hospital emergency room.  If you develope a fever above 101 F, pus (white  drainage) or increased drainage or redness at the wound, or calf pain, call your surgeon's office.   Constipation Prevention   Complete by:  As directed    Drink plenty of fluids.  Prune juice may be helpful.  You may use a stool softener, such as Colace (over the counter) 100 mg twice a day.  Use MiraLax (over the counter) for constipation as needed.   Diet - low sodium heart healthy   Complete by:  As directed    Discharge instructions   Complete by:  As directed    INSTRUCTIONS AFTER JOINT REPLACEMENT   Remove items at home which could result in a fall. This includes throw rugs or furniture in walking pathways ICE to the affected joint every three hours while awake for 30 minutes at a time, for at least the first 3-5 days, and then as needed for pain and swelling.  Continue to use ice for pain and swelling. You may notice swelling that will progress down to the foot and ankle.  This is normal after surgery.  Elevate your leg when you are not up walking on it.   Continue to use the breathing machine you got in the hospital (incentive spirometer) which will help keep your temperature down.  It is common for your temperature to cycle up and down following surgery, especially at night when you are not up moving around and exerting yourself.  The breathing machine keeps your lungs expanded and  your temperature down.   DIET:  As you were doing prior to hospitalization, we recommend a well-balanced diet.  DRESSING / WOUND CARE / SHOWERING  You may shower 3 days after surgery, but keep the wounds dry during showering.  You may use an occlusive plastic wrap (Press'n Seal for example), NO SOAKING/SUBMERGING IN THE BATHTUB.  If the bandage gets wet, change with a clean dry gauze.  If the incision gets wet, pat the wound dry with a clean towel.  ACTIVITY  Increase activity slowly as tolerated, but follow the weight bearing instructions below.   No driving for 6 weeks or until further direction given  by your physician.  You cannot drive while taking narcotics.  No lifting or carrying greater than 10 lbs. until further directed by your surgeon. Avoid periods of inactivity such as sitting longer than an hour when not asleep. This helps prevent blood clots.  You may return to work once you are authorized by your doctor.     WEIGHT BEARING   Weight bearing as tolerated with assist device (walker, cane, etc) as directed, use it as long as suggested by your surgeon or therapist, typically at least 4-6 weeks.   EXERCISES  Results after joint replacement surgery are often greatly improved when you follow the exercise, range of motion and muscle strengthening exercises prescribed by your doctor. Safety measures are also important to protect the joint from further injury. Any time any of these exercises cause you to have increased pain or swelling, decrease what you are doing until you are comfortable again and then slowly increase them. If you have problems or questions, call your caregiver or physical therapist for advice.   Rehabilitation is important following a joint replacement. After just a few days of immobilization, the muscles of the leg can become weakened and shrink (atrophy).  These exercises are designed to build up the tone and strength of the thigh and leg muscles and to improve motion. Often times heat used for twenty to thirty minutes before working out will loosen up your tissues and help with improving the range of motion but do not use heat for the first two weeks following surgery (sometimes heat can increase post-operative swelling).   These exercises can be done on a training (exercise) mat, on the floor, on a table or on a bed. Use whatever works the best and is most comfortable for you.    Use music or television while you are exercising so that the exercises are a pleasant break in your day. This will make your life better with the exercises acting as a break in your routine  that you can look forward to.   Perform all exercises about fifteen times, three times per day or as directed.  You should exercise both the operative leg and the other leg as well.   Exercises include:   Quad Sets - Tighten up the muscle on the front of the thigh (Quad) and hold for 5-10 seconds.   Straight Leg Raises - With your knee straight (if you were given a brace, keep it on), lift the leg to 60 degrees, hold for 3 seconds, and slowly lower the leg.  Perform this exercise against resistance later as your leg gets stronger.  Leg Slides: Lying on your back, slowly slide your foot toward your buttocks, bending your knee up off the floor (only go as far as is comfortable). Then slowly slide your foot back down until your leg is flat on  the floor again.  Angel Wings: Lying on your back spread your legs to the side as far apart as you can without causing discomfort.  Hamstring Strength:  Lying on your back, push your heel against the floor with your leg straight by tightening up the muscles of your buttocks.  Repeat, but this time bend your knee to a comfortable angle, and push your heel against the floor.  You may put a pillow under the heel to make it more comfortable if necessary.   A rehabilitation program following joint replacement surgery can speed recovery and prevent re-injury in the future due to weakened muscles. Contact your doctor or a physical therapist for more information on knee rehabilitation.    CONSTIPATION  Constipation is defined medically as fewer than three stools per week and severe constipation as less than one stool per week.  Even if you have a regular bowel pattern at home, your normal regimen is likely to be disrupted due to multiple reasons following surgery.  Combination of anesthesia, postoperative narcotics, change in appetite and fluid intake all can affect your bowels.   YOU MUST use at least one of the following options; they are listed in order of increasing  strength to get the job done.  They are all available over the counter, and you may need to use some, POSSIBLY even all of these options:    Drink plenty of fluids (prune juice may be helpful) and high fiber foods Colace 100 mg by mouth twice a day  Senokot for constipation as directed and as needed Dulcolax (bisacodyl), take with full glass of water  Miralax (polyethylene glycol) once or twice a day as needed.  If you have tried all these things and are unable to have a bowel movement in the first 3-4 days after surgery call either your surgeon or your primary doctor.    If you experience loose stools or diarrhea, hold the medications until you stool forms back up.  If your symptoms do not get better within 1 week or if they get worse, check with your doctor.  If you experience "the worst abdominal pain ever" or develop nausea or vomiting, please contact the office immediately for further recommendations for treatment.   ITCHING:  If you experience itching with your medications, try taking only a single pain pill, or even half a pain pill at a time.  You can also use Benadryl over the counter for itching or also to help with sleep.   TED HOSE STOCKINGS:  Use stockings on both legs until for at least 2 weeks or as directed by physician office. They may be removed at night for sleeping.  MEDICATIONS:  See your medication summary on the "After Visit Summary" that nursing will review with you.  You may have some home medications which will be placed on hold until you complete the course of blood thinner medication.  It is important for you to complete the blood thinner medication as prescribed.  PRECAUTIONS:  If you experience chest pain or shortness of breath - call 911 immediately for transfer to the hospital emergency department.   If you develop a fever greater that 101 F, purulent drainage from wound, increased redness or drainage from wound, foul odor from the wound/dressing, or calf pain -  CONTACT YOUR SURGEON.  FOLLOW-UP APPOINTMENTS:  If you do not already have a post-op appointment, please call the office for an appointment to be seen by your surgeon.  Guidelines for how soon to be seen are listed in your "After Visit Summary", but are typically between 1-4 weeks after surgery.  OTHER INSTRUCTIONS:   Knee Replacement:  Do not place pillow under knee, focus on keeping the knee straight while resting. CPM instructions: 0-90 degrees, 2 hours in the morning, 2 hours in the afternoon, and 2 hours in the evening. Place foam block, curve side up under heel at all times except when in CPM or when walking.  DO NOT modify, tear, cut, or change the foam block in any way.  MAKE SURE YOU:  Understand these instructions.  Get help right away if you are not doing well or get worse.    Thank you for letting us be a part of your medical care team.  It is a privilege we respect greatly.  We hope these instructions will help you stay on track for a fast and full recovery!   Increase activity slowly as tolerated   Complete by:  As directed       Follow-up Information    Melrose Nakayama, MD. Schedule an appointment as soon as possible for a visit in 2 weeks.   Specialty:  Orthopedic Surgery Contact information: [place] [address] [phone]            Signed: Rich Fuchs 04/29/2017, 12:31 PM

## 2017-04-29 NOTE — Progress Notes (Signed)
Physical Therapy Treatment Patient Details Name: [name] MRN: [id number] DOB: August 19, 1940 Today's Date: 04/29/2017    History of Present Illness 77 y.o. male admitted on 04/27/17 for elective R TKA.  Pt with significant PMH of essential HTN, RLS, PE, PVD, obesity, fibromyalgia, dysrhythmia, CA, L TSA, L TKA, multiple back and cervical spine surgery.      PT Comments    Despite increased soreness in R knee today and lack of sleep last night, pt continues to remain mostly supervision for all mobility.  He needs cues for LE sequencing on stairs and is not ready to try a cane again today as his knee is more antalgic.  HEP reviewed again and pt is hopeful to get clearance to d/c home from MD today.  He is physically ready.  PT to follow acutely until d/c confirmed.     Follow Up Recommendations  Follow surgeon's recommendation for DC plan and follow-up therapies;Supervision for mobility/OOB     Equipment Recommendations  Rolling walker with 5" wheels;3in1 (PT)    Recommendations for Other Services   NA     Precautions / Restrictions Precautions Precautions: Knee Precaution Booklet Issued: Yes (comment) Precaution Comments: knee exercise handout given precautions reviewed.  Required Braces or Orthoses: (d/c yesterday due to good SLR) Restrictions RLE Weight Bearing: Weight bearing as tolerated    Mobility  Bed Mobility               General bed mobility comments: Pt was OOB in the recliner chair.   Transfers Overall transfer level: Needs assistance Equipment used: Rolling walker (2 wheeled) Transfers: Sit to/from Stand Sit to Stand: Supervision         General transfer comment: Supervision for safety  Ambulation/Gait Ambulation/Gait assistance: Supervision Ambulation Distance (Feet): 150 Feet Assistive device: Rolling walker (2 wheeled) Gait Pattern/deviations: Step-through pattern;Antalgic     General Gait Details: Pt with increased R knee soreness  today, so increased to moderately antalgic gait pattern.  Pt is not ready today to try cane again.     Stairs Stairs: Yes   Stair Management: Two rails;Step to pattern;Alternating pattern;Forwards Number of Stairs: 5(x2) General stair comments: Verbal reminders again for leading leg up and down, he can report, but forgets while he is doing it.          Balance Overall balance assessment: Needs assistance Sitting-balance support: Feet supported;Bilateral upper extremity supported Sitting balance-Leahy Scale: Good     Standing balance support: Bilateral upper extremity supported Standing balance-Leahy Scale: Good                              Cognition Arousal/Alertness: Awake/alert Behavior During Therapy: WFL for tasks assessed/performed Overall Cognitive Status: Within Functional Limits for tasks assessed                                        Exercises Total Joint Exercises Ankle Circles/Pumps: AROM;Both;20 reps Quad Sets: AROM;Right;10 reps Towel Squeeze: AROM;Both;10 reps Short Arc Quad: AROM;Right;10 reps Heel Slides: AROM;Right;10 reps Hip ABduction/ADduction: AROM;Right;10 reps Straight Leg Raises: AROM;Right;10 reps Long Arc Quad: AROM;Right;10 reps Knee Flexion: AROM;Right;10 reps;Seated Goniometric ROM: 8-90        Pertinent Vitals/Pain Pain Assessment: Faces Faces Pain Scale: Hurts whole lot Pain Location: right knee Pain Descriptors / Indicators: Sore Pain Intervention(s): Limited activity within patient's tolerance;Monitored during  session;Repositioned           PT Goals (current goals can now be found in the care plan section) Acute Rehab PT Goals Patient Stated Goal: to get back to work in 6 weeks Progress towards PT goals: Progressing toward goals    Frequency    7X/week      PT Plan Current plan remains appropriate       AM-PAC PT "6 Clicks" Daily Activity  Outcome Measure  Difficulty turning over  in bed (including adjusting bedclothes, sheets and blankets)?: None Difficulty moving from lying on back to sitting on the side of the bed? : None Difficulty sitting down on and standing up from a chair with arms (e.g., wheelchair, bedside commode, etc,.)?: None Help needed moving to and from a bed to chair (including a wheelchair)?: None Help needed walking in hospital room?: None Help needed climbing 3-5 steps with a railing? : None 6 Click Score: 24    End of Session   Activity Tolerance: Patient limited by fatigue;Patient limited by pain Patient left: in chair;with call bell/phone within reach   PT Visit Diagnosis: Muscle weakness (generalized) (M62.81);Difficulty in walking, not elsewhere classified (R26.2);Pain Pain - Right/Left: Right Pain - part of body: Knee     Time: 6803-2122 PT Time Calculation (min) (ACUTE ONLY): 27 min  Charges:  $Gait Training: 8-22 mins $Therapeutic Exercise: 8-22 mins      Latorsha Curling B. [place], [place], DPT [phone]           04/29/2017, 12:34 PM

## 2017-04-29 NOTE — Progress Notes (Signed)
ANTICOAGULATION CONSULT NOTE - Follow Up Consult  Pharmacy Consult for warfarin Indication: atrial fibrillation and VTE prophylaxis  Allergies  Allergen Reactions  . Sulfacetamide Sodium Swelling    throat swelling  . Latex Itching  . Adhesive [Tape] Other (See Comments)    Tears skin-- "No tape of any kind, they all tear skin right off" Tears skin-- "No tape of any kind, they all tear skin right off"    Patient Measurements: Height: 6\' 1"  (185.4 cm) Weight: 198 lb 1.6 oz (89.9 kg) IBW/kg (Calculated) : 79.9  Vital Signs: Temp: 98.7 F (37.1 C) (03/21 0517) Temp Source: Oral (03/21 0517) BP: 93/54 (03/21 0517) Pulse Rate: 94 (03/21 0517)  Labs: Recent Labs    04/27/17 0828 04/27/17 1638 04/28/17 0559 04/29/17 0703  HGB  --  12.2*  --   --   HCT  --  37.2*  --   --   PLT  --  193  --   --   APTT 31  --   --   --   LABPROT 14.7  --  13.7 18.1*  INR 1.16  --  1.06 1.51  CREATININE  --  1.60*  --   --     Estimated Creatinine Clearance: 45.1 mL/min (A) (by C-G formula based on SCr of 1.6 mg/dL (H)).   Medical History: Past Medical History:  Diagnosis Date  . Arthritis    hands   . Asthma   . Cancer ([place])    SKIN CANCER REMOVED  . Chronic atrial fibrillation (HCC)    a.fib on warfarin- Dr. Percival Spanish follows  . Diverticulosis of colon (without mention of hemorrhage)   . Dysrhythmia    a fib  . ED (erectile dysfunction)   . Fibromyalgia   . GERD (gastroesophageal reflux disease)   . Hard of hearing    wears hearing aids  . Headache   . History of blood clots    behind right knee and then went into left lung 57yrs ago  . History of colon polyps   . History of kidney stones   . Hx of diabetes mellitus    "no longer diabetic since gastric bypass" - no longer taking metformin"  in 2 months "problems with low blood sugar now"  . Hx of gout    but doesn't take any meds  . Inflammation of colonic mucosa    recent admission and release from [facility]  02-28-15-remains on oral antibiotic  . Obesity   . Peripheral vascular disease ([place])   . Pneumonia    last time about 69yrs ago  . Pulmonary embolism ([place])    over 10 yrs ago "many years ago"  . Restless legs syndrome (RLS)   . Unspecified asthma(493.90)    as a child  . Unspecified essential hypertension    hx of no longer on medication due to gastric bypass     Medications:  Medications Prior to Admission  Medication Sig Dispense Refill Last Dose  . atorvastatin (LIPITOR) 80 MG tablet Take 1 tablet (80 mg total) by mouth at bedtime. 90 tablet 3 04/26/2017 at Unknown time  . cyclobenzaprine (FLEXERIL) 10 MG tablet TAKE 1 TABLET BY ORAL ROUTE 2 TIMES EVERY DAY AS NEEDED FOR MUSCLE SPASMS/TIGHTNESS  0 Taking  . FLUoxetine (PROZAC) 40 MG capsule Take 1 capsule (40 mg total) by mouth daily. 90 capsule 1 04/26/2017 at Unknown time  . furosemide (LASIX) 20 MG tablet Take 1-2 tablets (20-40 mg total) by mouth daily. (Patient  taking differently: Take 40 mg by mouth daily. ) 180 tablet 1 04/26/2017 at Unknown time  . gabapentin (NEURONTIN) 300 MG capsule Take 1 capsule (300 mg total) by mouth 2 (two) times daily. 180 capsule 0 04/26/2017 at Unknown time  . hydrocortisone 2.5 % cream APPLICATIONS APPLY ON THE SKIN APPLY ON THE SKIN TWICE A DAY AS NEEDED  0 Past Week at Unknown time  . lansoprazole (PREVACID) 30 MG capsule TAKE 1 CAPSULE (30 MG TOTAL) BY MOUTH ONCE DAILY.  3 Taking  . lidocaine (XYLOCAINE) 5 % ointment APPLY 1 GRAM ( 1 GRAM = 1 INCH) TO THE AFFECTED AREA 2 TIMES A DAY  12 Past Week at Unknown time  . lisinopril (PRINIVIL,ZESTRIL) 2.5 MG tablet Take 2.5 mg by mouth daily.    04/26/2017 at Unknown time  . mometasone (ASMANEX) 220 MCG/INH inhaler Inhale 1 puff into the lungs 2 (two) times daily as needed.    04/26/2017 at Unknown time  . nystatin cream (MYCOSTATIN) APPLY TO SKIN TWICE DAILY AS DIRECTED AS NEEDED  2 Past Week at Unknown time  . omeprazole (PRILOSEC) 40 MG capsule Take 40 mg  by mouth daily.   04/26/2017 at Unknown time  . primidone (MYSOLINE) 50 MG tablet 150 mg in the morning, 100 mg at night 450 tablet 1 04/26/2017 at Unknown time  . ranitidine (ZANTAC) 300 MG tablet Take 300 mg by mouth at bedtime.    04/26/2017 at Unknown time  . silodosin (RAPAFLO) 8 MG CAPS capsule Take 8 mg by mouth daily with breakfast.    04/26/2017 at Unknown time  . sucralfate (CARAFATE) 1 g tablet Take 1 g by mouth daily before breakfast.   04/26/2017 at Unknown time  . tamsulosin (FLOMAX) 0.4 MG CAPS capsule Take 0.4 mg by mouth daily.    04/26/2017 at Unknown time  . warfarin (COUMADIN) 2.5 MG tablet TAKE 1 TO 1.5 TABLETS BY  MOUTH DAILY OR AS DIRECTED  BY COUMADIN CLINIC (Patient taking differently: Take 2.5-3.75 mg by mouth See admin instructions. TAKE 1 TO 1.5 TABLETS BY  MOUTH DAILY OR AS DIRECTED  BY COUMADIN CLINIC 2.5 Sunday and Thursday, all other days is 3.75 mg) 120 tablet 0 Past Week at Unknown time  . zolpidem (AMBIEN) 10 MG tablet TAKE 1 TABLET BY MOUTH AT BEDTIME AS NEEDED 30 tablet 1 04/26/2017 at Unknown time    Assessment: 77 y/o male on warfarin PTA for hx Afib s/p right TKR. Pharmacy consulted to resume warfarin post-op. INR up to 1.5 after only 2 doses of warfarin.   PTA: 3.75 mg daily except 2.5 mg Thu and Sun  Goal of Therapy:  INR 2-3 Monitor platelets by anticoagulation protocol: Yes   Plan:  - Warfarin 3 mg PO tonight - Lovenox 30 mg SQ BID (will d/c when INR is therapeutic) - Daily INR - Monitor for s/sx of bleeding - Given quick INR rise after 2 doses of warfarin, If planning to discharge today, would recommend discharge on 3 mg daily with f/u INR check no later than next Monday. Likely can resume home dose of warfarin when patient has stable and therapeutic INR    Albertina Parr, PharmD., BCPS Clinical Pharmacist Clinical phone for 04/29/17 until 3:30pm: [phone] If after 3:30pm, please call main pharmacy at: [phone]

## 2017-04-29 NOTE — Care Management Note (Signed)
Case Management Note  Patient Details  Name: [name] MRN: [id number] Date of Birth: 17-Oct-1940  Subjective/Objective: 77 yr old gentleman s/p right total knee arthroplasty.        Action/Plan: Case manager spoke with patient and his wife concerning discharge plan and DME. Patient was preoperatively setup with [facility], no changes. He will have family support at discharge.    Expected Discharge Date:  04/29/17               Expected Discharge Plan:  [place]  In-House Referral:  NA  Discharge planning Services  CM Consult  Post Acute Care Choice:  Durable Medical Equipment, Home Health Choice offered to:  Patient, Spouse  DME Arranged:  3-N-1, [place] rolling DME Agency:  [employer]  HH Arranged:  PT [place]:  [facility] [employer]  Status of Service:  Completed, signed off  If discussed at [employer], dates discussed:    Additional Comments:  Ninfa Meeker, RN 04/29/2017, 12:45 PM

## 2017-04-29 NOTE — Progress Notes (Signed)
Trina Ao to be D/C'd  per MD order. Discussed with the patient and all questions fully answered.  VSS, Skin clean, dry and intact without evidence of skin break down, no evidence of skin tears noted.  IV catheter discontinued intact. Site without signs and symptoms of complications. Dressing and pressure applied.  An After Visit Summary was printed and given to the patient. Patient received prescription.  D/c education completed with patient/family including follow up instructions, medication list, d/c activities limitations if indicated, with other d/c instructions as indicated by MD - patient able to verbalize understanding, all questions fully answered.   Patient instructed to return to ED, call 911, or call MD for any changes in condition.   Patient to be escorted via [place], and D/C home via private auto.

## 2017-04-30 ENCOUNTER — Ambulatory Visit: Payer: Medicare Other | Admitting: Neurology

## 2017-05-01 DIAGNOSIS — I10 Essential (primary) hypertension: Secondary | ICD-10-CM | POA: Diagnosis not present

## 2017-05-01 DIAGNOSIS — M19041 Primary osteoarthritis, right hand: Secondary | ICD-10-CM | POA: Diagnosis not present

## 2017-05-01 DIAGNOSIS — M19042 Primary osteoarthritis, left hand: Secondary | ICD-10-CM | POA: Diagnosis not present

## 2017-05-01 DIAGNOSIS — E1141 Type 2 diabetes mellitus with diabetic mononeuropathy: Secondary | ICD-10-CM | POA: Diagnosis not present

## 2017-05-01 DIAGNOSIS — M4802 Spinal stenosis, cervical region: Secondary | ICD-10-CM | POA: Diagnosis not present

## 2017-05-01 DIAGNOSIS — Z471 Aftercare following joint replacement surgery: Secondary | ICD-10-CM | POA: Diagnosis not present

## 2017-05-03 DIAGNOSIS — I10 Essential (primary) hypertension: Secondary | ICD-10-CM | POA: Diagnosis not present

## 2017-05-03 DIAGNOSIS — M4802 Spinal stenosis, cervical region: Secondary | ICD-10-CM | POA: Diagnosis not present

## 2017-05-03 DIAGNOSIS — Z471 Aftercare following joint replacement surgery: Secondary | ICD-10-CM | POA: Diagnosis not present

## 2017-05-03 DIAGNOSIS — M19041 Primary osteoarthritis, right hand: Secondary | ICD-10-CM | POA: Diagnosis not present

## 2017-05-03 DIAGNOSIS — E1141 Type 2 diabetes mellitus with diabetic mononeuropathy: Secondary | ICD-10-CM | POA: Diagnosis not present

## 2017-05-03 DIAGNOSIS — M19042 Primary osteoarthritis, left hand: Secondary | ICD-10-CM | POA: Diagnosis not present

## 2017-05-04 DIAGNOSIS — M4802 Spinal stenosis, cervical region: Secondary | ICD-10-CM | POA: Diagnosis not present

## 2017-05-04 DIAGNOSIS — I10 Essential (primary) hypertension: Secondary | ICD-10-CM | POA: Diagnosis not present

## 2017-05-04 DIAGNOSIS — Z471 Aftercare following joint replacement surgery: Secondary | ICD-10-CM | POA: Diagnosis not present

## 2017-05-04 DIAGNOSIS — M19041 Primary osteoarthritis, right hand: Secondary | ICD-10-CM | POA: Diagnosis not present

## 2017-05-04 DIAGNOSIS — E1141 Type 2 diabetes mellitus with diabetic mononeuropathy: Secondary | ICD-10-CM | POA: Diagnosis not present

## 2017-05-04 DIAGNOSIS — M19042 Primary osteoarthritis, left hand: Secondary | ICD-10-CM | POA: Diagnosis not present

## 2017-05-05 DIAGNOSIS — E1141 Type 2 diabetes mellitus with diabetic mononeuropathy: Secondary | ICD-10-CM | POA: Diagnosis not present

## 2017-05-05 DIAGNOSIS — I10 Essential (primary) hypertension: Secondary | ICD-10-CM | POA: Diagnosis not present

## 2017-05-05 DIAGNOSIS — Z471 Aftercare following joint replacement surgery: Secondary | ICD-10-CM | POA: Diagnosis not present

## 2017-05-05 DIAGNOSIS — M19041 Primary osteoarthritis, right hand: Secondary | ICD-10-CM | POA: Diagnosis not present

## 2017-05-05 DIAGNOSIS — M19042 Primary osteoarthritis, left hand: Secondary | ICD-10-CM | POA: Diagnosis not present

## 2017-05-05 DIAGNOSIS — M4802 Spinal stenosis, cervical region: Secondary | ICD-10-CM | POA: Diagnosis not present

## 2017-05-06 DIAGNOSIS — Z471 Aftercare following joint replacement surgery: Secondary | ICD-10-CM | POA: Diagnosis not present

## 2017-05-06 DIAGNOSIS — M4802 Spinal stenosis, cervical region: Secondary | ICD-10-CM | POA: Diagnosis not present

## 2017-05-06 DIAGNOSIS — I10 Essential (primary) hypertension: Secondary | ICD-10-CM | POA: Diagnosis not present

## 2017-05-06 DIAGNOSIS — M19042 Primary osteoarthritis, left hand: Secondary | ICD-10-CM | POA: Diagnosis not present

## 2017-05-06 DIAGNOSIS — E1141 Type 2 diabetes mellitus with diabetic mononeuropathy: Secondary | ICD-10-CM | POA: Diagnosis not present

## 2017-05-06 DIAGNOSIS — M19041 Primary osteoarthritis, right hand: Secondary | ICD-10-CM | POA: Diagnosis not present

## 2017-05-07 DIAGNOSIS — M25661 Stiffness of right knee, not elsewhere classified: Secondary | ICD-10-CM | POA: Diagnosis not present

## 2017-05-07 DIAGNOSIS — M1711 Unilateral primary osteoarthritis, right knee: Secondary | ICD-10-CM | POA: Diagnosis not present

## 2017-05-07 DIAGNOSIS — M25561 Pain in right knee: Secondary | ICD-10-CM | POA: Diagnosis not present

## 2017-05-07 DIAGNOSIS — R262 Difficulty in walking, not elsewhere classified: Secondary | ICD-10-CM | POA: Diagnosis not present

## 2017-05-11 DIAGNOSIS — M25661 Stiffness of right knee, not elsewhere classified: Secondary | ICD-10-CM | POA: Diagnosis not present

## 2017-05-11 DIAGNOSIS — M25561 Pain in right knee: Secondary | ICD-10-CM | POA: Diagnosis not present

## 2017-05-11 DIAGNOSIS — R262 Difficulty in walking, not elsewhere classified: Secondary | ICD-10-CM | POA: Diagnosis not present

## 2017-05-13 DIAGNOSIS — M25561 Pain in right knee: Secondary | ICD-10-CM | POA: Diagnosis not present

## 2017-05-13 DIAGNOSIS — R262 Difficulty in walking, not elsewhere classified: Secondary | ICD-10-CM | POA: Diagnosis not present

## 2017-05-13 DIAGNOSIS — M25661 Stiffness of right knee, not elsewhere classified: Secondary | ICD-10-CM | POA: Diagnosis not present

## 2017-05-15 ENCOUNTER — Other Ambulatory Visit: Payer: Self-pay | Admitting: Family Medicine

## 2017-05-17 DIAGNOSIS — Z96653 Presence of artificial knee joint, bilateral: Secondary | ICD-10-CM | POA: Diagnosis not present

## 2017-05-17 DIAGNOSIS — Z471 Aftercare following joint replacement surgery: Secondary | ICD-10-CM | POA: Diagnosis not present

## 2017-05-17 DIAGNOSIS — M25561 Pain in right knee: Secondary | ICD-10-CM | POA: Diagnosis not present

## 2017-05-17 NOTE — Telephone Encounter (Signed)
Last office visit 03/09/2017.  Last refilled 03/18/2017 for #30 with 1 refill.  Ok to refill?

## 2017-05-18 ENCOUNTER — Ambulatory Visit (INDEPENDENT_AMBULATORY_CARE_PROVIDER_SITE_OTHER): Payer: Medicare Other

## 2017-05-18 DIAGNOSIS — M25661 Stiffness of right knee, not elsewhere classified: Secondary | ICD-10-CM | POA: Diagnosis not present

## 2017-05-18 DIAGNOSIS — Z5181 Encounter for therapeutic drug level monitoring: Secondary | ICD-10-CM

## 2017-05-18 DIAGNOSIS — Z7901 Long term (current) use of anticoagulants: Secondary | ICD-10-CM

## 2017-05-18 DIAGNOSIS — I824Y9 Acute embolism and thrombosis of unspecified deep veins of unspecified proximal lower extremity: Secondary | ICD-10-CM | POA: Diagnosis not present

## 2017-05-18 DIAGNOSIS — I4891 Unspecified atrial fibrillation: Secondary | ICD-10-CM | POA: Diagnosis not present

## 2017-05-18 DIAGNOSIS — M25561 Pain in right knee: Secondary | ICD-10-CM | POA: Diagnosis not present

## 2017-05-18 DIAGNOSIS — R262 Difficulty in walking, not elsewhere classified: Secondary | ICD-10-CM | POA: Diagnosis not present

## 2017-05-18 LAB — POCT INR: INR: 2.5

## 2017-05-18 NOTE — Patient Instructions (Signed)
Description   Continue on same dosage 1.5 tablets everyday except 1 tablet on Sundays and Thursdays. Recheck in 4 weeks. Call if you start any new medications [phone].

## 2017-05-20 DIAGNOSIS — M25561 Pain in right knee: Secondary | ICD-10-CM | POA: Diagnosis not present

## 2017-05-20 DIAGNOSIS — M25661 Stiffness of right knee, not elsewhere classified: Secondary | ICD-10-CM | POA: Diagnosis not present

## 2017-05-20 DIAGNOSIS — R262 Difficulty in walking, not elsewhere classified: Secondary | ICD-10-CM | POA: Diagnosis not present

## 2017-05-25 DIAGNOSIS — R262 Difficulty in walking, not elsewhere classified: Secondary | ICD-10-CM | POA: Diagnosis not present

## 2017-05-25 DIAGNOSIS — M25561 Pain in right knee: Secondary | ICD-10-CM | POA: Diagnosis not present

## 2017-05-25 DIAGNOSIS — M25661 Stiffness of right knee, not elsewhere classified: Secondary | ICD-10-CM | POA: Diagnosis not present

## 2017-05-26 DIAGNOSIS — M25561 Pain in right knee: Secondary | ICD-10-CM | POA: Diagnosis not present

## 2017-05-26 DIAGNOSIS — Z96653 Presence of artificial knee joint, bilateral: Secondary | ICD-10-CM | POA: Diagnosis not present

## 2017-05-26 DIAGNOSIS — Z471 Aftercare following joint replacement surgery: Secondary | ICD-10-CM | POA: Diagnosis not present

## 2017-05-27 DIAGNOSIS — R262 Difficulty in walking, not elsewhere classified: Secondary | ICD-10-CM | POA: Diagnosis not present

## 2017-05-27 DIAGNOSIS — M25461 Effusion, right knee: Secondary | ICD-10-CM | POA: Diagnosis not present

## 2017-05-27 DIAGNOSIS — M25661 Stiffness of right knee, not elsewhere classified: Secondary | ICD-10-CM | POA: Diagnosis not present

## 2017-05-27 DIAGNOSIS — M25561 Pain in right knee: Secondary | ICD-10-CM | POA: Diagnosis not present

## 2017-05-28 ENCOUNTER — Other Ambulatory Visit: Payer: Self-pay | Admitting: Cardiology

## 2017-05-31 DIAGNOSIS — M25561 Pain in right knee: Secondary | ICD-10-CM | POA: Diagnosis not present

## 2017-05-31 DIAGNOSIS — H04123 Dry eye syndrome of bilateral lacrimal glands: Secondary | ICD-10-CM | POA: Diagnosis not present

## 2017-05-31 DIAGNOSIS — M50322 Other cervical disc degeneration at C5-C6 level: Secondary | ICD-10-CM | POA: Diagnosis not present

## 2017-05-31 DIAGNOSIS — Z96653 Presence of artificial knee joint, bilateral: Secondary | ICD-10-CM | POA: Diagnosis not present

## 2017-05-31 DIAGNOSIS — Z471 Aftercare following joint replacement surgery: Secondary | ICD-10-CM | POA: Diagnosis not present

## 2017-05-31 DIAGNOSIS — M9901 Segmental and somatic dysfunction of cervical region: Secondary | ICD-10-CM | POA: Diagnosis not present

## 2017-05-31 DIAGNOSIS — S0501XA Injury of conjunctiva and corneal abrasion without foreign body, right eye, initial encounter: Secondary | ICD-10-CM | POA: Diagnosis not present

## 2017-06-01 ENCOUNTER — Other Ambulatory Visit (HOSPITAL_COMMUNITY): Payer: Self-pay | Admitting: Orthopaedic Surgery

## 2017-06-01 ENCOUNTER — Other Ambulatory Visit (HOSPITAL_COMMUNITY): Payer: Self-pay | Admitting: Interventional Radiology

## 2017-06-01 ENCOUNTER — Other Ambulatory Visit: Payer: Self-pay | Admitting: Orthopaedic Surgery

## 2017-06-01 DIAGNOSIS — B999 Unspecified infectious disease: Secondary | ICD-10-CM

## 2017-06-02 ENCOUNTER — Ambulatory Visit (HOSPITAL_COMMUNITY)
Admission: RE | Admit: 2017-06-02 | Discharge: 2017-06-02 | Disposition: A | Discharge status: Home / Self Care | Payer: Medicare Other | Source: Ambulatory Visit | Attending: Orthopaedic Surgery | Admitting: Orthopaedic Surgery

## 2017-06-02 DIAGNOSIS — X58XXXA Exposure to other specified factors, initial encounter: Secondary | ICD-10-CM | POA: Insufficient documentation

## 2017-06-02 DIAGNOSIS — B999 Unspecified infectious disease: Secondary | ICD-10-CM

## 2017-06-02 DIAGNOSIS — Z452 Encounter for adjustment and management of vascular access device: Secondary | ICD-10-CM | POA: Diagnosis not present

## 2017-06-02 DIAGNOSIS — T8453XA Infection and inflammatory reaction due to internal right knee prosthesis, initial encounter: Secondary | ICD-10-CM | POA: Insufficient documentation

## 2017-06-02 MED ORDER — LIDOCAINE HCL (PF) 1 % IJ SOLN
INTRAMUSCULAR | Status: DC | PRN
  Administered 2017-06-02: 5 mL

## 2017-06-02 MED ORDER — LIDOCAINE HCL 1 % IJ SOLN
INTRAMUSCULAR | Status: AC
  Filled 2017-06-02: qty 20

## 2017-06-02 MED ORDER — VANCOMYCIN HCL 10 G IV SOLR
1500.0000 mg | Freq: Once | INTRAVENOUS | Status: AC
  Administered 2017-06-02: 1500 mg via INTRAVENOUS
  Filled 2017-06-02: qty 1500

## 2017-06-02 MED ORDER — HEPARIN SOD (PORK) LOCK FLUSH 100 UNIT/ML IV SOLN
500.0000 [IU] | Freq: Once | INTRAVENOUS | Status: AC
  Administered 2017-06-02: 500 [IU] via INTRAVENOUS

## 2017-06-02 MED ORDER — HEPARIN SOD (PORK) LOCK FLUSH 100 UNIT/ML IV SOLN
INTRAVENOUS | Status: AC
  Administered 2017-06-02: 500 [IU] via INTRAVENOUS
  Filled 2017-06-02: qty 5

## 2017-06-02 NOTE — Progress Notes (Signed)
Pharmacy Antibiotic Note  KHYRON GARNO is a 77 y.o. male with MRSA infection.  Plan is to continue IV antibiotics outpatient.  He has an estimated creatinine clearance of ~ 29ml/min.     As an outpatient he will need 1.5gm every 24 hours to achieve a  Calculated peak ~ 38.27mcg/ml and trough of 36mcg/ml  Plan: Vancomycin 1.5gm x 1   Weight: 198 lb (89.8 kg)   Allergies  Allergen Reactions  . Sulfacetamide Sodium Swelling    throat swelling  . Latex Itching  . Adhesive [Tape] Other (See Comments)    Tears skin-- "No tape of any kind, they all tear skin right off" Tears skin-- "No tape of any kind, they all tear skin right off"     Tomasita Morrow 06/02/2017 3:18 PM

## 2017-06-02 NOTE — Procedures (Signed)
RUE PICC 38 cm SVC RA EBL 0 Comp 0

## 2017-06-03 ENCOUNTER — Other Ambulatory Visit: Payer: Self-pay

## 2017-06-03 ENCOUNTER — Emergency Department (HOSPITAL_COMMUNITY)
Admission: EM | Admit: 2017-06-03 | Discharge: 2017-06-03 | Disposition: A | Discharge status: Home / Self Care | Payer: Medicare Other | Attending: Emergency Medicine | Admitting: Emergency Medicine

## 2017-06-03 ENCOUNTER — Telehealth: Payer: Self-pay | Admitting: Family Medicine

## 2017-06-03 ENCOUNTER — Encounter (HOSPITAL_COMMUNITY): Payer: Self-pay | Admitting: Emergency Medicine

## 2017-06-03 ENCOUNTER — Emergency Department (HOSPITAL_COMMUNITY): Payer: Medicare Other

## 2017-06-03 DIAGNOSIS — G8918 Other acute postprocedural pain: Secondary | ICD-10-CM | POA: Insufficient documentation

## 2017-06-03 DIAGNOSIS — Z9884 Bariatric surgery status: Secondary | ICD-10-CM | POA: Insufficient documentation

## 2017-06-03 DIAGNOSIS — Z86711 Personal history of pulmonary embolism: Secondary | ICD-10-CM | POA: Insufficient documentation

## 2017-06-03 DIAGNOSIS — Z7901 Long term (current) use of anticoagulants: Secondary | ICD-10-CM | POA: Diagnosis not present

## 2017-06-03 DIAGNOSIS — R251 Tremor, unspecified: Secondary | ICD-10-CM | POA: Diagnosis not present

## 2017-06-03 DIAGNOSIS — M25561 Pain in right knee: Secondary | ICD-10-CM | POA: Diagnosis not present

## 2017-06-03 DIAGNOSIS — I959 Hypotension, unspecified: Secondary | ICD-10-CM

## 2017-06-03 DIAGNOSIS — Z85828 Personal history of other malignant neoplasm of skin: Secondary | ICD-10-CM | POA: Diagnosis not present

## 2017-06-03 DIAGNOSIS — T8189XA Other complications of procedures, not elsewhere classified, initial encounter: Secondary | ICD-10-CM | POA: Diagnosis not present

## 2017-06-03 DIAGNOSIS — Z96652 Presence of left artificial knee joint: Secondary | ICD-10-CM | POA: Insufficient documentation

## 2017-06-03 DIAGNOSIS — Z79899 Other long term (current) drug therapy: Secondary | ICD-10-CM | POA: Diagnosis not present

## 2017-06-03 DIAGNOSIS — Z471 Aftercare following joint replacement surgery: Secondary | ICD-10-CM | POA: Diagnosis not present

## 2017-06-03 DIAGNOSIS — Y658 Other specified misadventures during surgical and medical care: Secondary | ICD-10-CM | POA: Diagnosis not present

## 2017-06-03 DIAGNOSIS — J45909 Unspecified asthma, uncomplicated: Secondary | ICD-10-CM | POA: Insufficient documentation

## 2017-06-03 DIAGNOSIS — Z96651 Presence of right artificial knee joint: Secondary | ICD-10-CM | POA: Diagnosis not present

## 2017-06-03 DIAGNOSIS — Z959 Presence of cardiac and vascular implant and graft, unspecified: Secondary | ICD-10-CM | POA: Insufficient documentation

## 2017-06-03 DIAGNOSIS — R52 Pain, unspecified: Secondary | ICD-10-CM | POA: Diagnosis not present

## 2017-06-03 LAB — CBC WITH DIFFERENTIAL/PLATELET
BASOS ABS: 0 10*3/uL (ref 0.0–0.1)
Basophils Relative: 1 %
Eosinophils Absolute: 0.1 10*3/uL (ref 0.0–0.7)
Eosinophils Relative: 3 %
HEMATOCRIT: 32.8 % — AB (ref 39.0–52.0)
HEMOGLOBIN: 10.8 g/dL — AB (ref 13.0–17.0)
LYMPHS PCT: 14 %
Lymphs Abs: 0.7 10*3/uL (ref 0.7–4.0)
MCH: 31.6 pg (ref 26.0–34.0)
MCHC: 32.9 g/dL (ref 30.0–36.0)
MCV: 95.9 fL (ref 78.0–100.0)
MONO ABS: 0.3 10*3/uL (ref 0.1–1.0)
Monocytes Relative: 6 %
NEUTROS ABS: 3.8 10*3/uL (ref 1.7–7.7)
NEUTROS PCT: 78 %
Platelets: 190 10*3/uL (ref 150–400)
RBC: 3.42 MIL/uL — AB (ref 4.22–5.81)
RDW: 14.6 % (ref 11.5–15.5)
WBC: 4.9 10*3/uL (ref 4.0–10.5)

## 2017-06-03 LAB — PROTIME-INR
INR: 3.6
Prothrombin Time: 35.7 seconds — ABNORMAL HIGH (ref 11.4–15.2)

## 2017-06-03 LAB — I-STAT CG4 LACTIC ACID, ED: LACTIC ACID, VENOUS: 0.78 mmol/L (ref 0.5–1.9)

## 2017-06-03 LAB — URINALYSIS, ROUTINE W REFLEX MICROSCOPIC
BILIRUBIN URINE: NEGATIVE
Bacteria, UA: NONE SEEN
Glucose, UA: NEGATIVE mg/dL
HGB URINE DIPSTICK: NEGATIVE
KETONES UR: NEGATIVE mg/dL
NITRITE: NEGATIVE
PROTEIN: NEGATIVE mg/dL
Specific Gravity, Urine: 1.013 (ref 1.005–1.030)
pH: 5 (ref 5.0–8.0)

## 2017-06-03 LAB — COMPREHENSIVE METABOLIC PANEL
ALBUMIN: 3.2 g/dL — AB (ref 3.5–5.0)
ALT: 91 U/L — ABNORMAL HIGH (ref 17–63)
ANION GAP: 8 (ref 5–15)
AST: 77 U/L — AB (ref 15–41)
Alkaline Phosphatase: 111 U/L (ref 38–126)
BUN: 25 mg/dL — AB (ref 6–20)
CHLORIDE: 107 mmol/L (ref 101–111)
CO2: 20 mmol/L — ABNORMAL LOW (ref 22–32)
Calcium: 8.2 mg/dL — ABNORMAL LOW (ref 8.9–10.3)
Creatinine, Ser: 1.97 mg/dL — ABNORMAL HIGH (ref 0.61–1.24)
GFR calc Af Amer: 37 mL/min — ABNORMAL LOW (ref >60)
GFR calc non Af Amer: 31 mL/min — ABNORMAL LOW (ref >60)
Glucose, Bld: 136 mg/dL — ABNORMAL HIGH (ref 65–99)
POTASSIUM: 5.3 mmol/L — AB (ref 3.5–5.1)
Sodium: 135 mmol/L (ref 135–145)
Total Bilirubin: 0.5 mg/dL (ref 0.3–1.2)
Total Protein: 6.5 g/dL (ref 6.5–8.1)

## 2017-06-03 MED ORDER — FENTANYL CITRATE (PF) 100 MCG/2ML IJ SOLN
50.0000 ug | Freq: Once | INTRAMUSCULAR | Status: AC
Start: 2017-06-03 — End: 2017-06-03
  Administered 2017-06-03: 50 ug via INTRAVENOUS
  Filled 2017-06-03: qty 2

## 2017-06-03 MED ORDER — FENTANYL CITRATE (PF) 100 MCG/2ML IJ SOLN
50.0000 ug | Freq: Once | INTRAMUSCULAR | Status: AC
  Administered 2017-06-03: 50 ug via INTRAVENOUS
  Filled 2017-06-03: qty 2

## 2017-06-03 MED ORDER — LORAZEPAM 2 MG/ML IJ SOLN
1.0000 mg | Freq: Once | INTRAMUSCULAR | Status: AC
  Administered 2017-06-03: 1 mg via INTRAVENOUS
  Filled 2017-06-03: qty 1

## 2017-06-03 MED ORDER — HYDROCODONE-ACETAMINOPHEN 5-325 MG PO TABS: 1.0000 | ORAL_TABLET | Freq: Four times a day (QID) | ORAL | 0 refills | Status: DC | PRN

## 2017-06-03 MED ORDER — SODIUM CHLORIDE 0.9 % IV BOLUS
500.0000 mL | Freq: Once | INTRAVENOUS | Status: AC
  Administered 2017-06-03: 500 mL via INTRAVENOUS

## 2017-06-03 MED ORDER — SODIUM CHLORIDE 0.9 % IV BOLUS
1000.0000 mL | Freq: Once | INTRAVENOUS | Status: AC
  Administered 2017-06-03: 1000 mL via INTRAVENOUS

## 2017-06-03 MED ORDER — LORAZEPAM 1 MG PO TABS: 0.5000 mg | ORAL_TABLET | Freq: Three times a day (TID) | ORAL | 0 refills | Status: DC | PRN

## 2017-06-03 NOTE — ED Notes (Signed)
PA made aware patient is requesting additional pain medication.

## 2017-06-03 NOTE — Discharge Instructions (Signed)
As we discussed, you take the Ativan for anxiety, tremors.  Do not take it at the same time as your muscle relaxer.  He can take the pain medication for severe breakthrough pain.  As we discussed make sure you are drinking plenty of fluids and staying hydrated.  As we discussed, your liver enzymes were slightly elevated here in the department today.  Have your primary care doctor recheck this in 1 to 2 weeks.  Do not drink any alcohol and limit the amount of Tylenol you are taking.  Return to the emergency department for any fevers, worsening pain, redness or swelling of the knee, chest pain, difficulty breathing or any other worsening or concerning symptoms.

## 2017-06-03 NOTE — ED Notes (Signed)
Patient given sprite and sandwich. 

## 2017-06-03 NOTE — ED Notes (Signed)
Family at bedside. 

## 2017-06-03 NOTE — ED Provider Notes (Signed)
[facility] DEPT Provider Note   CSN: [id number] Arrival date & time: 06/03/17  1223     History   Chief Complaint Chief Complaint  Patient presents with  . Hypotension    HPI [name] is a 77 y.o. male who is status post TKA (04/27/2017 by Dr. Rhona Raider) who presents for evaluation of hypotension.  Patient was at his orthopedic doctor's office today for evaluation and was noted to have low blood pressure.  They repeated the blood pressure several times and states that his systolic was ranging between 60s and 80s.  Patient was probably come to the emergency department for further evaluation.  Patient reports that his normal blood pressure runs between 58K and 998 systolically.  Patient reports that he had been seen by his orthopedic doctor and was concerned about infected wound site.  Patient had a culture done that showed MRSA at the incision site.  Patient had a PICC line inserted and was started on vancomycin yesterday.  Additionally, patient's lisinopril was halted yesterday.  Patient reports that he has had pain radiating from his right knee.  He has noted he has had some warmth and redness around the incision site.  He states that prior to the antibiotics being started, he did notice some drainage.  Patient reports that he is currently on blood thinners and has been compliant with them.  Patient denies any fevers, dizziness, chest pain, difficulty breathing, abdominal pain, nausea/vomiting, numbness/weakness.  The history is provided by the patient.    Past Medical History:  Diagnosis Date  . Arthritis    hands   . Asthma   . Cancer ([place])    SKIN CANCER REMOVED  . Chronic atrial fibrillation (HCC)    a.fib on warfarin- Dr. Percival Spanish follows  . Diverticulosis of colon (without mention of hemorrhage)   . Dysrhythmia    a fib  . ED (erectile dysfunction)   . Fibromyalgia   . GERD (gastroesophageal reflux disease)   . Hard of hearing    wears hearing aids  . Headache   . History of blood clots    behind right knee and then went into left lung 69yrs ago  . History of colon polyps   . History of kidney stones   . Hx of diabetes mellitus    "no longer diabetic since gastric bypass" - no longer taking metformin"  in 2 months "problems with low blood sugar now"  . Hx of gout    but doesn't take any meds  . Inflammation of colonic mucosa    recent admission and release from [facility] 02-28-15-remains on oral antibiotic  . Obesity   . Peripheral vascular disease ([place])   . Pneumonia    last time about 49yrs ago  . Pulmonary embolism ([place])    over 10 yrs ago "many years ago"  . Restless legs syndrome (RLS)   . Unspecified asthma(493.90)    as a child  . Unspecified essential hypertension    hx of no longer on medication due to gastric bypass     Patient Active Problem List   Diagnosis Date Noted  . Primary localized osteoarthritis of right knee 04/27/2017  . Primary osteoarthritis of right knee 04/27/2017  . COPD exacerbation ([place]) 01/12/2017  . Acute neck pain 10/30/2016  . Anticoagulant long-term use 09/14/2016  . S/P panniculectomy 08/25/2016  . Persistent cough for 3 weeks or longer 02/28/2016  . Internal hernia 03/08/2015  . GERD (gastroesophageal reflux disease) 08/03/2014  .  Acid reflux 08/03/2014  . ED (erectile dysfunction) of organic origin 07/02/2014  . Type 2 diabetes mellitus with neurologic complication ([place]) 46/50/3546  . Counseling regarding end of life decision making 01/23/2014  . Dysphagia, pharyngoesophageal phase 08/30/2013  . Benign essential tremor 08/30/2013  . COPD, mild ([place]) 07/13/2013  . Postoperative pain, acute, shoulder 05/13/2013  . Arthralgia of shoulder 05/13/2013  . Cervical spinal stenosis 05/09/2013  . Encounter for therapeutic drug monitoring 04/26/2013  . Peripheral autonomic neuropathy due to diabetes mellitus ([place]) 12/11/2011  . Hereditary and idiopathic neuropathy 12/11/2011   . 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 History of surgical procedure 09/03/2011  . Atrial fibrillation, permanent ([place]) 08/12/2011  . Bacterial overgrowth syndrome 10/28/2010  . Nonsurgical dumping syndrome 10/14/2010  . 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 Diverticulitis of colon 09/02/2010  . Acute venous embolism and thrombosis of deep vessels of proximal lower extremity ([place]) 08/01/2010  . Vascular disorder of lower extremity 08/01/2010  . Osteoporosis 11/26/2009  . Anemia 09/02/2009  . RENAL INSUFFICIENCY 09/02/2009  . Allergic rhinitis 01/17/2009  . ASTHMA, PERSISTENT, MILD 11/13/2008  . Airway hyperreactivity 11/13/2008  . KNEE REPLACEMENT, LEFT, HX OF 12/18/2006  . HERPES ZOSTER W/NERVOUS COMPLICATION NEC 56/81/2751  . HYPERCHOLESTEROLEMIA 05/19/2006  . ANXIETY 05/19/2006  . Major depressive disorder, recurrent, mild ([place]) 05/19/2006  . RESTLESS LEG SYNDROME 05/19/2006  . DIVERTICULOSIS, COLON 05/19/2006  . IRRITABLE BOWEL SYNDROME 05/19/2006  . LOW BACK PAIN, CHRONIC 05/19/2006  . GAD (generalized anxiety disorder) 05/19/2006  . Diverticular disease of large intestine 05/19/2006  . Essential (primary) hypertension 05/19/2006  . Adaptive colitis 05/19/2006    Past Surgical History:  Procedure Laterality Date  . ANTERIOR CERVICAL DECOMP/DISCECTOMY FUSION N/A 05/09/2013   Procedure: ANTERIOR CERVICAL DECOMPRESSION/DISCECTOMY FUSION CERVICAL THREE-FOUR,CERVICAL FOUR-FIVE,CERVICAL FIVE-SIX;  Surgeon: Floyce Stakes, MD;  Location: [facility];  Service: Neurosurgery;  Laterality: N/A;  . ARTERY REPAIR     Left forearm  . BACK SURGERY     x 3  . BREATH TEK H PYLORI  01/08/2011   Procedure: BREATH TEK H PYLORI;  Surgeon: Pedro Earls, MD;  Location: [facility];  Service: General;  Laterality: N/A;  . CARDIOVERSION     x 2 attempts-unsuccessful.  [name] CATARACT EXTRACTION, BILATERAL    . COLONOSCOPY    . CYSTOSCOPY    .  ESOPHAGOGASTRODUODENOSCOPY (EGD) WITH PROPOFOL N/A 09/21/2014   Procedure: ESOPHAGOGASTRODUODENOSCOPY (EGD) WITH PROPOFOL;  Surgeon: Manya Silvas, MD;  Location: [facility] ENDOSCOPY;  Service: Endoscopy;  Laterality: N/A;  . EYE SURGERY     cataract bil  . FOOT SURGERY     Left foot   . GASTRIC ROUX-EN-Y  08/11/2011   Procedure: LAPAROSCOPIC ROUX-EN-Y GASTRIC BYPASS WITH UPPER ENDOSCOPY;  Surgeon: Pedro Earls, MD;  Location: [facility] ORS;  Service: General;  Laterality: N/A;  . HAND SURGERY     LEFT  . injections in back     x 18  . JOINT REPLACEMENT    . KNEE SURGERY Left    x 4  . KNEE SURGERY Left    arthroscopy  . LAPAROSCOPIC INTERNAL HERNIA REPAIR N/A 03/08/2015   Procedure: LAPAROSCOPIC INTERNAL HERNIA REPAIR ;  Surgeon: [name], MD;  Location: [facility] ORS;  Service: General;  Laterality: N/A;  . LEG SURGERY     FOR NECROTIZING FASCITIS L LEG AND GROIN  . PANNICULECTOMY N/A 08/25/2016   Procedure: PANNICULECTOMY;  Surgeon: [name], MD;  Location: [facility]  ORS;  Service: General;  Laterality: N/A;  . PENILE PROSTHESIS IMPLANT N/A 07/02/2014   Procedure: PENILE PROTHESIS INFLATABLE 3 PIECE (COLOPLAST) SCROTAL APPROACH;  Surgeon: Kathie Rhodes, MD;  Location: [facility] ORS;  Service: Urology;  Laterality: N/A;  . PENILE PROSTHESIS IMPLANT N/A 11/23/2014   Procedure: EXPLORATION AND REVISION OF PENILE PROSTHESIS;  Surgeon: Kathie Rhodes, MD;  Location: [facility] ORS;  Service: Urology;  Laterality: N/A;  . PICC INSERTION W/OUT PORT/PUMP    . REPLACEMENT TOTAL KNEE Left    x 2  . SHOULDER ARTHROSCOPY    . SHOULDER SURGERY Left 2014  . TONSILLECTOMY    . TOTAL KNEE ARTHROPLASTY Right 04/27/2017  . TOTAL KNEE ARTHROPLASTY Right 04/27/2017   Procedure: TOTAL KNEE ARTHROPLASTY;  Surgeon: Melrose Nakayama, MD;  Location: [facility];  Service: Orthopedics;  Laterality: Right;  . TOTAL SHOULDER ARTHROPLASTY Left 01/26/2013   Procedure: TOTAL SHOULDER ARTHROPLASTY;  Surgeon: Nita Sells, MD;   Location: [facility];  Service: Orthopedics;  Laterality: Left;  Left total shoulder arthroplasty        Home Medications    Prior to Admission medications   Medication Sig Start Date End Date Taking? Authorizing Provider  acarbose (PRECOSE) 50 MG tablet Take 50 mg by mouth 3 (three) times daily. 04/12/17  Yes [provider]  atorvastatin (LIPITOR) 80 MG tablet Take 1 tablet (80 mg total) by mouth at bedtime. 02/18/17  Yes Minus Breeding, MD  bisacodyl (DULCOLAX) 5 MG EC tablet Take 1 tablet (5 mg total) by mouth daily as needed for moderate constipation. 04/29/17  Yes Loni Dolly, PA-C  cephALEXin (KEFLEX) 500 MG capsule Take 500 mg by mouth 4 (four) times daily. For 10 days 05/26/17  Yes [provider]  clindamycin (CLEOCIN) 300 MG capsule Take 300 mg by mouth 4 (four) times daily. 05/31/17  Yes [provider]  docusate sodium (COLACE) 100 MG capsule Take 1 capsule (100 mg total) by mouth 2 (two) times daily. 04/29/17  Yes Loni Dolly, PA-C  FLUoxetine (PROZAC) 40 MG capsule Take 1 capsule (40 mg total) by mouth daily. 02/18/17  Yes Bedsole, Amy E, MD  furosemide (LASIX) 20 MG tablet Take 1-2 tablets (20-40 mg total) by mouth daily. Patient taking differently: Take 40 mg by mouth daily.  02/18/17  Yes Minus Breeding, MD  gabapentin (NEURONTIN) 300 MG capsule Take 1 capsule (300 mg total) by mouth 2 (two) times daily. 04/16/17  Yes Tat, Eustace Quail, DO  HYDROcodone-acetaminophen (NORCO) 7.5-325 MG tablet Take 1-2 tablets by mouth every 4 (four) hours as needed for moderate pain or severe pain. 04/29/17  Yes Loni Dolly, PA-C  HYDROcodone-acetaminophen (NORCO/VICODIN) 5-325 MG tablet 1 and 1/2 tablet every six hours 05/31/17  Yes [provider]  hydrocortisone 2.5 % cream APPLICATIONS APPLY ON THE SKIN APPLY ON THE SKIN TWICE A DAY AS NEEDED 03/01/17  Yes [provider]  L-Methylfolate-Algae-B12-B6 [place]) 3-90.314-2-35 MG CAPS Take 1 capsule by mouth 2 (two)  times daily. 04/14/17  Yes [provider]  lansoprazole (PREVACID) 30 MG capsule TAKE 1 CAPSULE (30 MG TOTAL) BY MOUTH ONCE DAILY. 02/26/17  Yes [provider]  lidocaine (XYLOCAINE) 5 % ointment APPLY 1 GRAM ( 1 GRAM = 1 INCH) TO THE AFFECTED AREA 2 TIMES A DAY 02/22/17  Yes [provider]  lisinopril (PRINIVIL,ZESTRIL) 2.5 MG tablet Take 2.5 mg by mouth daily.    Yes [provider]  nystatin cream (MYCOSTATIN) APPLY TO SKIN TWICE DAILY AS DIRECTED AS NEEDED  01/16/17  Yes [provider]  omeprazole (PRILOSEC) 40 MG capsule Take 40 mg by mouth daily.   Yes [provider]  primidone (MYSOLINE) 50 MG tablet 150 mg in the morning, 100 mg at night 03/08/17  Yes Tat, Eustace Quail, DO  ranitidine (ZANTAC) 300 MG tablet Take 300 mg by mouth at bedtime.    Yes [provider]  sucralfate (CARAFATE) 1 g tablet Take 1 g by mouth daily before breakfast.   Yes [provider]  tamsulosin (FLOMAX) 0.4 MG CAPS capsule Take 0.4 mg by mouth daily.    Yes [provider]  tiZANidine (ZANAFLEX) 4 MG tablet Take 1 tablet (4 mg total) by mouth every 6 (six) hours as needed. 04/29/17 04/29/18 Yes Loni Dolly, PA-C  warfarin (COUMADIN) 2.5 MG tablet TAKE 1 TO 1.5 TABLETS BY  MOUTH DAILY OR AS DIRECTED  BY COUMADIN CLINIC Patient taking differently: Take 2.5-3.75 mg by mouth See admin instructions. 2.5 Sunday and Thursday, all other days is 3.75 mg 02/18/17  Yes Minus Breeding, MD  XIIDRA 5 % SOLN INSTILL 1 DROPS INTO BOTH EYES TWICE A DAY 04/08/17  Yes [provider]  zolpidem (AMBIEN) 10 MG tablet TAKE 1 TABLET BY MOUTH EVERY DAY AT BEDTIME AS NEEDED 05/17/17  Yes Bedsole, Amy E, MD  enoxaparin (LOVENOX) 30 MG/0.3ML injection Inject 0.3 mLs (30 mg total) into the skin every 12 (twelve) hours. Patient not taking: Reported on 06/03/2017 04/29/17   Loni Dolly, PA-C  HYDROcodone-acetaminophen (NORCO/VICODIN) 5-325 MG tablet Take 1-2 tablets  by mouth every 6 (six) hours as needed. 06/03/17   Volanda Napoleon, PA-C  LORazepam (ATIVAN) 1 MG tablet Take 0.5 tablets (0.5 mg total) by mouth 3 (three) times daily as needed for anxiety. 06/03/17   Volanda Napoleon, PA-C  warfarin (COUMADIN) 2.5 MG tablet TAKE 1 TO 1.5 TABLETS BY MOUTH DAILY OR AS DIRECTED BY COUMADIN CLINIC Patient not taking: Reported on 06/03/2017 05/28/17   Minus Breeding, MD    Family History Family History  Problem Relation Age of Onset  . Uterine cancer Mother        mets  . Breast cancer Mother   . Cancer Mother        cervical  . Emphysema Father   . Alzheimer's disease Sister   . Prostate cancer Brother   . Heart attack Brother   . Colon cancer Brother 31    Social History Social History   Tobacco Use  . Smoking status: Never Smoker  . Smokeless tobacco: Never Used  Substance Use Topics  . Alcohol use: No    Alcohol/week: 0.0 oz  . Drug use: No     Allergies   Sulfacetamide sodium; Latex; and Adhesive [tape]   Review of Systems Review of Systems  Constitutional: Negative for chills and fever.  Respiratory: Negative for cough and shortness of breath.   Cardiovascular: Negative for chest pain.  Gastrointestinal: Negative for abdominal pain, diarrhea, nausea and vomiting.  Genitourinary: Negative for dysuria and hematuria.  Musculoskeletal: Negative for back pain and neck pain.  Skin: Positive for color change and wound. Negative for rash.  Neurological: Negative for dizziness, weakness, numbness and headaches.  All other systems reviewed and are negative.    Physical Exam Updated Vital Signs BP 124/74   Pulse 95   Temp 98.5 F (36.9 C) (Rectal)   Resp 16   Ht 6\' 1"  (1.854 m)   Wt 81.6 kg (180 lb)   SpO2 100%  BMI 23.75 kg/m   Physical Exam  Constitutional: He is oriented to person, place, and time. He appears well-developed and well-nourished.  HENT:  Head: Normocephalic and atraumatic.  Mouth/Throat: Oropharynx is  clear and moist and mucous membranes are normal.  Eyes: Pupils are equal, round, and reactive to light. Conjunctivae, EOM and lids are normal.  Neck: Full passive range of motion without pain.  Cardiovascular: Normal rate, regular rhythm, normal heart sounds and normal pulses. Exam reveals no gallop and no friction rub.  No murmur heard. Pulses:      Dorsalis pedis pulses are 2+ on the right side, and 2+ on the left side.  Pulmonary/Chest: Effort normal and breath sounds normal.  No evidence of respiratory distress. Able to speak in full sentences without difficulty.  PICC line noted at right chest.  No surrounding warmth, erythema.  Abdominal: Soft. Normal appearance. There is no tenderness. There is no rigidity and no guarding.  Abdomen is soft, non-distended, non-tender.   Musculoskeletal: Normal range of motion.  Tenderness palpation noted to the anterior aspect of the right knee.  There is some overlying soft tissue swelling. No warmth, erythema.  There is some mild drainage noted from the superior aspect of the incision site.  Limited range of motion right knee secondary to pain. No abnormalities of the left lower extremity.  Neurological: He is alert and oriented to person, place, and time.  Skin: Skin is warm and dry. Capillary refill takes less than 2 seconds.  Good distal cap refill.  RLE is not dusky in appearance or cool to touch.  Psychiatric: He has a normal mood and affect. His speech is normal.  Nursing note and vitals reviewed.    ED Treatments / Results  Labs (all labs ordered are listed, but only abnormal results are displayed) Labs Reviewed  COMPREHENSIVE METABOLIC PANEL - Abnormal; Notable for the following components:      Result Value   Potassium 5.3 (*)    CO2 20 (*)    Glucose, Bld 136 (*)    BUN 25 (*)    Creatinine, Ser 1.97 (*)    Calcium 8.2 (*)    Albumin 3.2 (*)    AST 77 (*)    ALT 91 (*)    GFR calc non Af Amer 31 (*)    GFR calc Af Amer 37 (*)      All other components within normal limits  CBC WITH DIFFERENTIAL/PLATELET - Abnormal; Notable for the following components:   RBC 3.42 (*)    Hemoglobin 10.8 (*)    HCT 32.8 (*)    All other components within normal limits  PROTIME-INR - Abnormal; Notable for the following components:   Prothrombin Time 35.7 (*)    All other components within normal limits  URINALYSIS, ROUTINE W REFLEX MICROSCOPIC - Abnormal; Notable for the following components:   Leukocytes, UA TRACE (*)    All other components within normal limits  CULTURE, BLOOD (ROUTINE X 2)  CULTURE, BLOOD (ROUTINE X 2)  I-STAT CG4 LACTIC ACID, ED    EKG None  Radiology Dg Knee Complete 4 Views Right  Result Date: 06/03/2017 CLINICAL DATA:  Hypotension. Status post knee replacement 1 month ago. EXAM: RIGHT KNEE - COMPLETE 4+ VIEW COMPARISON:  None. FINDINGS: Prior right total knee replacement. No evidence of hardware failure or loosening. No acute fracture or dislocation. Small suprapatellar joint effusion versus synovitis. Mild soft tissue swelling along the anterior knee. No subcutaneous emphysema. Vascular calcifications. IMPRESSION: 1.  Prior right total knee replacement without evidence of hardware complication. 2. Soft tissue swelling along the anterior knee. No subcutaneous emphysema. Electronically Signed   By: Titus Dubin M.D.   On: 06/03/2017 14:59   Ir Picc Placement Right >5 Yrs Inc Img Guide  Result Date: 06/02/2017 INDICATION: Infected total knee arthroplasty EXAM: RIGHT PICC LINE PLACEMENT WITH ULTRASOUND AND FLUOROSCOPIC GUIDANCE MEDICATIONS: None. ANESTHESIA/SEDATION: None FLUOROSCOPY TIME:  Fluoroscopy Time:  minutes 6 seconds (1 mGy). COMPLICATIONS: None immediate. PROCEDURE: The patient was advised of the possible risks and complications and agreed to undergo the procedure. The patient was then brought to the angiographic suite for the procedure. The right arm was prepped with chlorhexidine, draped in the  usual sterile fashion using maximum barrier technique (cap and mask, sterile gown, sterile gloves, large sterile sheet, hand hygiene and cutaneous antiseptic). Local anesthesia was attained by infiltration with 1% lidocaine. Ultrasound demonstrated patency of the basilic vein, and this was documented with an image. Under real-time ultrasound guidance, this vein was accessed with a 21 gauge micropuncture needle and image documentation was performed. The needle was exchanged over a guidewire for a peel-away sheath through which a 38 cm 5 [place] single lumen power injectable PICC was advanced, and positioned with its tip at the lower SVC/right atrial junction. Fluoroscopy during the procedure and fluoro spot radiograph confirms appropriate catheter position. The catheter was flushed, secured to the skin with Prolene sutures, and covered with a sterile dressing. IMPRESSION: Successful placement of a right arm PICC with sonographic and fluoroscopic guidance. The catheter is ready for use. Electronically Signed   By: Marybelle Killings M.D.   On: 06/02/2017 14:36    Procedures Procedures (including critical care time)  Medications Ordered in ED Medications  sodium chloride 0.9 % bolus 1,000 mL (0 mLs Intravenous Stopped 06/03/17 1558)  fentaNYL (SUBLIMAZE) injection 50 mcg (50 mcg Intravenous Given 06/03/17 1348)  sodium chloride 0.9 % bolus 500 mL (0 mLs Intravenous Stopped 06/03/17 1731)  fentaNYL (SUBLIMAZE) injection 50 mcg (50 mcg Intravenous Given 06/03/17 1614)  LORazepam (ATIVAN) injection 1 mg (1 mg Intravenous Given 06/03/17 1648)     Initial Impression / Assessment and Plan / ED Course  I have reviewed the triage vital signs and the nursing notes.  Pertinent labs & imaging results that were available during my care of the patient were reviewed by me and considered in my medical decision making (see chart for details).     77 year old male who presents for evaluation of hypertension.  Was at  orthopedic doctor's office and was found to have low blood pressure, probably go to ED for further evaluation.  No history of fevers, chest pain, difficulty breathing.  Patient reports that normally his blood pressure runs in the 29H systolically.  Recent TKA done on 04/27/2017.  Was recently found to have MRSA on the incision site and had a PICC line inserted to start Vanco. Patient is afebril, non-toxic appearing, sitting comfortably on examination table.  Vital signs reviewed.  Patient is slightly hypertensive.  No fever noted on exam.  On exam, he does have some tenderness noted to the anterior aspect of the right knee.  There is some overlying warmth and erythema.  He does have drainage at the superior aspect of the incision site.  Limited range of motion secondary to pain.  Initial labs ordered at triage.  Will add additional orthostatic vital signs, fluids, x-ray.   Discussed with Loni Dolly, PA-C ([facility]).  He has been  following patient's progress since TKA on 04/27/2017.  He reports that he has noted a progressive decline but is unclear what the etiology is.  He states that they did notice a spot at the incision site that was drained.  He states that the only drained from it was blood but they did culture it was positive for MRSA.  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  They did not believe he was actually infected but given the culture results, they started him on vancomycin via PICC line that was started yesterday.  He does report that patient will frequently be noncompliant with wound care precautions.  He states that he has been bathing the wound and has not been eating or drinking appropriately.  Additionally, they told me there was some concern about excessive alcohol use.   I-STAT lactic unremarkable.  PT shows 35.7.  INR is 2.60.  CBC shows white blood cell count of 4.9.  Slight anemia at 10.8 and 32.8.  Records reviewed show that  he does have history of anemia.  CMP shows potassium of 5.3.  BUN is 25, creatinine is 1.97. Last BUN and creatinine were 17 and 1.50.  His baseline fluctuates between 1.3 and 1.6.  AST is elevated at 77 and ALT is elevated at 91.    Most recent CMP is from August 2018.  Liver functions were normal at that time.  Patient states that he does not drink any alcohol.  X-ray is negative for any subcutaneous emphysema.  New  Discussed with Dr. Eulis Foster to and apparently evaluated patient.  Patient's orthopedic doctor has also come down and evaluated patient.  Exam is not concerning for septic arthritis.  Discussed results with patient and family.  Patient's vital signs are stable at this point.  He is still reporting some pain.  We will give an additional pain medication.  Additionally, patient is having some tremors.  Concern for alcohol withdrawal.  Will give a dose of Ativan here in the department and reassess.  Reevaluation.  Vital signs stable.  Blood pressure is 130/72.  Patient reports improvement in pain and tremors after Ativan. Patient wishes to go home.  Will give short course of Ativan to go home with.  Patient instructed not to take it at the same time as any muscle relaxers.  Instructed patient to follow-up with his primary care doctor regarding AST and ALT.  Additionally, patient instructed to follow-up with his orthopedic doctor as directed. Patient had ample opportunity for questions and discussion. All patient's questions were answered with full understanding. Strict return precautions discussed. Patient expresses understanding and agreement to plan.   Final Clinical Impressions(s) / ED Diagnoses   Final diagnoses:  Acute pain of right knee  Hypotension, unspecified hypotension type    ED Discharge Orders        Ordered    HYDROcodone-acetaminophen (NORCO/VICODIN) 5-325 MG tablet  Every 6 hours PRN     06/03/17 1739    LORazepam (ATIVAN) 1 MG tablet  3 times daily PRN     06/03/17 1739         Volanda Napoleon, PA-C 06/03/17 2331    Daleen Bo, MD 06/05/17 [phone]

## 2017-06-03 NOTE — ED Provider Notes (Addendum)
  Face-to-face evaluation   History: Patient sent today from orthopedic office for evaluation of pain and swelling and knee, postoperative from knee replacement 2 weeks ago.  Currently he has been treated with vancomycin after a culture of a pustule showed MRSA infection.  Physical exam: Alert elderly male who is shaking, and uncomfortable.  Right knee is swollen anteriorly.  Healing anterior wound, with superficial breakdown in the upper portion.  No associated bleeding, drainage or fluctuance.  Normal range of motion right hip and right knee.  Right knee diffusely tender to palpation anteriorly.  No significant erythema associated with the right knee surgical wound site.  Patient was evaluated in the ED by his orthopedist, who did not think there was a infected knee or prosthesis.  Additional IV fluids, and food ordered.  Evaluation is not  consistent with significant infection of the surgical wound.   Medical screening examination/treatment/procedure(s) were conducted as a shared visit with non-physician practitioner(s) and myself.  I personally evaluated the patient during the encounter   Daleen Bo, MD 06/03/17 2049    Daleen Bo, MD 06/05/17 [phone]

## 2017-06-03 NOTE — ED Triage Notes (Addendum)
Per EMS- [facility] called to transport pt to ED for hypotension. 72/56 HR 80. Pt is alert, oriented and appropriate. Intial BP 64/48.  Pt is 1 month post-op r/knee replacement. MRSA identified on suture line. PICC line inserted in r/AC yesterday. Antibiotics intiated. 20 G LAC prior to transport. Last VS BP 90/55, HR 70 Resp24 Sat 100. Hx of controlled A-Fib, noted by 12 lead.

## 2017-06-03 NOTE — ED Notes (Signed)
Bed: [facility] Expected date:  Expected time:  Means of arrival:  Comments: 

## 2017-06-03 NOTE — ED Notes (Signed)
ED Provider at bedside. 

## 2017-06-03 NOTE — Telephone Encounter (Signed)
Dr. Berenice Primas called - [name] has been getting some IV Vanc aftter TKA. Nursing found BP to be low, and he wanted to know if we could stop his lisinopril.   I think that is fine, they will stop and only restart at Dr. Rometta Emery direction.

## 2017-06-03 NOTE — ED Notes (Signed)
Patient transported to X-ray 

## 2017-06-03 NOTE — ED Triage Notes (Signed)
Pt was seen at the ortho office today  due to c/o  Increased r/knee pain and a concern about persistant low blood pressure. Family is concerned about possible sepsis. Pt was hospitalized in 2013 for sepsis.

## 2017-06-04 ENCOUNTER — Other Ambulatory Visit (HOSPITAL_COMMUNITY): Payer: Self-pay

## 2017-06-04 NOTE — Telephone Encounter (Signed)
Agreed -

## 2017-06-07 ENCOUNTER — Ambulatory Visit (HOSPITAL_COMMUNITY)
Admission: RE | Admit: 2017-06-07 | Discharge: 2017-06-07 | Disposition: A | Discharge status: Home / Self Care | Payer: Medicare Other | Source: Ambulatory Visit | Attending: Orthopaedic Surgery | Admitting: Orthopaedic Surgery

## 2017-06-07 ENCOUNTER — Other Ambulatory Visit: Payer: Self-pay | Admitting: Family Medicine

## 2017-06-07 DIAGNOSIS — Z8614 Personal history of Methicillin resistant Staphylococcus aureus infection: Secondary | ICD-10-CM | POA: Insufficient documentation

## 2017-06-07 DIAGNOSIS — Z79899 Other long term (current) drug therapy: Secondary | ICD-10-CM | POA: Diagnosis not present

## 2017-06-07 DIAGNOSIS — Z96651 Presence of right artificial knee joint: Secondary | ICD-10-CM | POA: Diagnosis not present

## 2017-06-07 MED ORDER — HEPARIN SOD (PORK) LOCK FLUSH 100 UNIT/ML IV SOLN
250.0000 [IU] | INTRAVENOUS | Status: AC | PRN
  Administered 2017-06-07: 250 [IU]

## 2017-06-07 MED ORDER — VANCOMYCIN HCL 10 G IV SOLR
1250.0000 mg | INTRAVENOUS | Status: DC
  Administered 2017-06-07: 1250 mg via INTRAVENOUS
  Filled 2017-06-07: qty 500

## 2017-06-07 MED ORDER — HEPARIN SOD (PORK) LOCK FLUSH 100 UNIT/ML IV SOLN
INTRAVENOUS | Status: AC
  Administered 2017-06-07: 250 [IU]
  Filled 2017-06-07: qty 5

## 2017-06-07 NOTE — Telephone Encounter (Signed)
Wife requesting Xanax for husband.   See attached note.     Pt of Dr. Rometta Emery

## 2017-06-07 NOTE — Telephone Encounter (Signed)
Copied from [place] [id number]. Topic: Quick Communication - See Telephone Encounter >> Jun 07, 2017  9:18 AM Ahmed Prima L wrote: CRM for notification. See Telephone encounter for: 06/07/17.  [name] (wife) called and wanted to know if Dr Diona Browner would refill ALPRAZolam Duanne Moron) tablet 0.5 mg. She said he was given that in the hospital 4/25. She said it is helping him. He has an appt for a ER f/u on 5/2.  [id number] - [place], [place] - [place]

## 2017-06-07 NOTE — Telephone Encounter (Signed)
Spoke with Loni Dolly PA with ortho who will prescribe short lorazepam and oxycodone course for pt while he gets in to see PCP on Thursday - more detailed note to follow.

## 2017-06-07 NOTE — Consult Note (Signed)
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  Dosing Weight: 85 kg  Labs: Estimated Creatinine Clearance: 36.6 mL/min (A) (by C-G formula based on SCr of 1.97 mg/dL (H)).  MEDICATION: Antibiotic[s]:    Start     Dose/Rate Route Frequency Ordered Stop   06/07/17 1315  vancomycin (VANCOCIN) 1,250 mg in sodium chloride 0.9 % 250 mL IVPB     1,250 mg 166.7 mL/hr over 90 Minutes Intravenous Every 24 hours 06/07/17 1312 06/21/17 1314     Goal of Therapy:  Vancomycin trough level 15-20 mcg/ml  Plan:  Vancomycin 1250 mg IV daily x 14 doses.  Will check Vancomycin trough level, renal status as required.  Marthenia Rolling,  Pharm.D   06/07/2017,  1:21 PM   06/07/2017,1:21 PM

## 2017-06-08 ENCOUNTER — Ambulatory Visit (HOSPITAL_COMMUNITY)
Admission: RE | Admit: 2017-06-08 | Discharge: 2017-06-08 | Disposition: A | Discharge status: Home / Self Care | Payer: Medicare Other | Source: Ambulatory Visit | Attending: Orthopaedic Surgery | Admitting: Orthopaedic Surgery

## 2017-06-08 DIAGNOSIS — A4902 Methicillin resistant Staphylococcus aureus infection, unspecified site: Secondary | ICD-10-CM | POA: Insufficient documentation

## 2017-06-08 DIAGNOSIS — Y838 Other surgical procedures as the cause of abnormal reaction of the patient, or of later complication, without mention of misadventure at the time of the procedure: Secondary | ICD-10-CM | POA: Diagnosis not present

## 2017-06-08 DIAGNOSIS — T8453XA Infection and inflammatory reaction due to internal right knee prosthesis, initial encounter: Secondary | ICD-10-CM | POA: Diagnosis not present

## 2017-06-08 LAB — CULTURE, BLOOD (ROUTINE X 2)
Culture: NO GROWTH
Culture: NO GROWTH
SPECIAL REQUESTS: ADEQUATE

## 2017-06-08 MED ORDER — HEPARIN SOD (PORK) LOCK FLUSH 100 UNIT/ML IV SOLN
INTRAVENOUS | Status: AC
  Administered 2017-06-08: 250 [IU]
  Filled 2017-06-08: qty 5

## 2017-06-08 MED ORDER — VANCOMYCIN HCL 10 G IV SOLR
1250.0000 mg | INTRAVENOUS | Status: DC
  Administered 2017-06-08: 1250 mg via INTRAVENOUS
  Filled 2017-06-08: qty 1250

## 2017-06-08 MED ORDER — HEPARIN SOD (PORK) LOCK FLUSH 100 UNIT/ML IV SOLN
250.0000 [IU] | INTRAVENOUS | Status: DC | PRN
  Administered 2017-06-08: 250 [IU]

## 2017-06-08 MED ORDER — HEPARIN SOD (PORK) LOCK FLUSH 100 UNIT/ML IV SOLN: 250.0000 [IU] | Freq: Every day | INTRAVENOUS | Status: DC

## 2017-06-08 NOTE — Telephone Encounter (Signed)
Pt is 1 month s/p knee replacement. Seen by ortho last week with hypotension, tachycardia, didn't look well - sent to ER where workup was unrevealing for sepsis but did show mild worsening of kidney function and new mild transaminitis of unclear cause. Also INR elevated to 3.6 -pt on coumadin  (range 2.5-3.5). Treated in ER with IVF and short benzo course with good resolution of symptoms. Ortho states knee looks good, not source of symptoms. He has received vanc for pustule and h/o MRSA.  [facility] called to ask about possible h/o alcohol use, ?alcohol withdrawal. Reviewing chart, no records of this. They will prescribe short course of narcotic and benzo while pt gets in to see PCP for further eval.

## 2017-06-08 NOTE — Telephone Encounter (Signed)
Noted  

## 2017-06-09 ENCOUNTER — Ambulatory Visit (HOSPITAL_COMMUNITY)
Admission: RE | Admit: 2017-06-09 | Discharge: 2017-06-09 | Disposition: A | Discharge status: Home / Self Care | Payer: Medicare Other | Source: Ambulatory Visit | Attending: Orthopaedic Surgery | Admitting: Orthopaedic Surgery

## 2017-06-09 DIAGNOSIS — A4902 Methicillin resistant Staphylococcus aureus infection, unspecified site: Secondary | ICD-10-CM | POA: Insufficient documentation

## 2017-06-09 DIAGNOSIS — Z96651 Presence of right artificial knee joint: Secondary | ICD-10-CM | POA: Insufficient documentation

## 2017-06-09 DIAGNOSIS — Z9889 Other specified postprocedural states: Secondary | ICD-10-CM | POA: Diagnosis not present

## 2017-06-09 MED ORDER — HEPARIN SOD (PORK) LOCK FLUSH 100 UNIT/ML IV SOLN
250.0000 [IU] | Freq: Every day | INTRAVENOUS | Status: DC
  Administered 2017-06-09: 250 [IU]

## 2017-06-09 MED ORDER — HEPARIN SOD (PORK) LOCK FLUSH 100 UNIT/ML IV SOLN: 250.0000 [IU] | INTRAVENOUS | Status: DC | PRN

## 2017-06-09 MED ORDER — VANCOMYCIN HCL 10 G IV SOLR
1250.0000 mg | INTRAVENOUS | Status: DC
  Administered 2017-06-09: 1250 mg via INTRAVENOUS
  Filled 2017-06-09: qty 1250

## 2017-06-09 MED ORDER — HEPARIN SOD (PORK) LOCK FLUSH 100 UNIT/ML IV SOLN
INTRAVENOUS | Status: AC
  Filled 2017-06-09: qty 5

## 2017-06-10 ENCOUNTER — Ambulatory Visit: Payer: Medicare Other | Admitting: Family Medicine

## 2017-06-10 ENCOUNTER — Ambulatory Visit (HOSPITAL_COMMUNITY)
Admission: RE | Admit: 2017-06-10 | Discharge: 2017-06-10 | Disposition: A | Discharge status: Home / Self Care | Payer: Medicare Other | Source: Ambulatory Visit | Attending: Orthopaedic Surgery | Admitting: Orthopaedic Surgery

## 2017-06-10 DIAGNOSIS — A4902 Methicillin resistant Staphylococcus aureus infection, unspecified site: Secondary | ICD-10-CM | POA: Insufficient documentation

## 2017-06-10 DIAGNOSIS — H04123 Dry eye syndrome of bilateral lacrimal glands: Secondary | ICD-10-CM | POA: Diagnosis not present

## 2017-06-10 DIAGNOSIS — Z96651 Presence of right artificial knee joint: Secondary | ICD-10-CM | POA: Insufficient documentation

## 2017-06-10 MED ORDER — HEPARIN SOD (PORK) LOCK FLUSH 100 UNIT/ML IV SOLN: 250.0000 [IU] | Freq: Every day | INTRAVENOUS | Status: DC

## 2017-06-10 MED ORDER — HEPARIN SOD (PORK) LOCK FLUSH 100 UNIT/ML IV SOLN
250.0000 [IU] | INTRAVENOUS | Status: DC | PRN
  Administered 2017-06-10: 250 [IU]

## 2017-06-10 MED ORDER — VANCOMYCIN HCL 10 G IV SOLR
1250.0000 mg | INTRAVENOUS | Status: DC
  Administered 2017-06-10: 1250 mg via INTRAVENOUS
  Filled 2017-06-10: qty 1250

## 2017-06-11 ENCOUNTER — Encounter: Payer: Self-pay | Admitting: Family Medicine

## 2017-06-11 ENCOUNTER — Other Ambulatory Visit: Payer: Self-pay

## 2017-06-11 ENCOUNTER — Ambulatory Visit (INDEPENDENT_AMBULATORY_CARE_PROVIDER_SITE_OTHER): Payer: Medicare Other | Admitting: Family Medicine

## 2017-06-11 ENCOUNTER — Other Ambulatory Visit (HOSPITAL_COMMUNITY): Payer: Self-pay | Admitting: *Deleted

## 2017-06-11 ENCOUNTER — Other Ambulatory Visit (HOSPITAL_COMMUNITY): Payer: Self-pay | Admitting: Pharmacist

## 2017-06-11 ENCOUNTER — Ambulatory Visit (HOSPITAL_COMMUNITY)
Admission: RE | Admit: 2017-06-11 | Discharge: 2017-06-11 | Disposition: A | Discharge status: Home / Self Care | Payer: Medicare Other | Source: Ambulatory Visit | Attending: Orthopaedic Surgery | Admitting: Orthopaedic Surgery

## 2017-06-11 VITALS — BP 72/55 | HR 81 | Temp 97.6°F | Ht 72.0 in | Wt 186.5 lb

## 2017-06-11 DIAGNOSIS — D649 Anemia, unspecified: Secondary | ICD-10-CM | POA: Diagnosis not present

## 2017-06-11 DIAGNOSIS — Z96653 Presence of artificial knee joint, bilateral: Secondary | ICD-10-CM | POA: Diagnosis not present

## 2017-06-11 DIAGNOSIS — A4902 Methicillin resistant Staphylococcus aureus infection, unspecified site: Secondary | ICD-10-CM | POA: Insufficient documentation

## 2017-06-11 DIAGNOSIS — R7989 Other specified abnormal findings of blood chemistry: Secondary | ICD-10-CM | POA: Insufficient documentation

## 2017-06-11 DIAGNOSIS — F411 Generalized anxiety disorder: Secondary | ICD-10-CM | POA: Diagnosis not present

## 2017-06-11 DIAGNOSIS — M25561 Pain in right knee: Secondary | ICD-10-CM | POA: Diagnosis not present

## 2017-06-11 DIAGNOSIS — R945 Abnormal results of liver function studies: Secondary | ICD-10-CM

## 2017-06-11 DIAGNOSIS — I959 Hypotension, unspecified: Secondary | ICD-10-CM | POA: Diagnosis not present

## 2017-06-11 DIAGNOSIS — X58XXXA Exposure to other specified factors, initial encounter: Secondary | ICD-10-CM | POA: Insufficient documentation

## 2017-06-11 DIAGNOSIS — T8140XA Infection following a procedure, unspecified, initial encounter: Secondary | ICD-10-CM | POA: Diagnosis not present

## 2017-06-11 DIAGNOSIS — N179 Acute kidney failure, unspecified: Secondary | ICD-10-CM

## 2017-06-11 LAB — VANCOMYCIN, TROUGH: VANCOMYCIN TR: 17 ug/mL (ref 15–20)

## 2017-06-11 MED ORDER — LORAZEPAM 1 MG PO TABS: 0.5000 mg | ORAL_TABLET | Freq: Two times a day (BID) | ORAL | 0 refills | Status: DC | PRN

## 2017-06-11 MED ORDER — HEPARIN SOD (PORK) LOCK FLUSH 100 UNIT/ML IV SOLN
250.0000 [IU] | INTRAVENOUS | Status: DC | PRN
  Administered 2017-06-11: 250 [IU]

## 2017-06-11 MED ORDER — HEPARIN SOD (PORK) LOCK FLUSH 100 UNIT/ML IV SOLN
INTRAVENOUS | Status: AC
  Administered 2017-06-11: 250 [IU]
  Filled 2017-06-11: qty 5

## 2017-06-11 MED ORDER — VANCOMYCIN HCL 10 G IV SOLR
1250.0000 mg | INTRAVENOUS | Status: DC
  Administered 2017-06-11: 1250 mg via INTRAVENOUS
  Filled 2017-06-11: qty 1250

## 2017-06-11 MED ORDER — HEPARIN SOD (PORK) LOCK FLUSH 100 UNIT/ML IV SOLN: 250.0000 [IU] | Freq: Every day | INTRAVENOUS | Status: DC

## 2017-06-11 NOTE — Progress Notes (Signed)
Subjective:    Patient ID: [name], male    DOB: [birth year], 77 y.o.   MRN: [id number]  HPI   77 year old male  With DM, afib, COPD,  presents for follow up right knee pain and hypotension resulting in ER visit on 06/03/3017.   Summary as follows: referred from orthopedic office for evaluation of pain and swelling and knee,  As well as low BP ( 60-80s at home) postoperative from knee replacement 4 weeks ago.  Currently he has been treated with vancomycin via PICC after a culture of a pustule showed MRSA infection.  Blood cultures: no growth 5 days CMET: ARF, increased LFTS Cbc: hg 10.8 down from 12.2 UA: neg  lactic acid nml Given IV fluids  ORtho consulted in ED... Did not feel infection of joint likely.   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     Today:  BPs usually noted to run 100/60.. But continue to be lower today. He feels it is because he has not eaten  Or drank yet today He has been off his lisinopril.  Cardiology  Dr. Percival Spanish.   He reports he continues to have knee pain, but not worsening. No fever. No recent chills.  He has follow up today with orthopedics.  he is taking vicodin once every 6 hours. He has 8 more days of vancomycin. He is not lightheaded dizzy.  He is taking lasix 40 mg daily. No  Swelling in ankles.  He has been feeling more anxious and agitated  Since knee surgery. . Request lorazepam refill.  Increased AST and ALT. He denies alcohol.  He does  Have hx of fatty liver.   Brother and sister with no alcoholic liver issue.  He is taking tylenol in the vicodin.. 1200 mg . Day. Not excessive, no new meds.  NML 8 month ago was  nml.  Blood pressure (!) 72/55, pulse 81, temperature 97.6 F (36.4 C), temperature source Oral, height 6' (1.829 m), weight 186 lb 8 oz (84.6 kg).   Wt Readings from Last 3 Encounters:  06/11/17 186 lb 8 oz (84.6 kg)  06/10/17 180 lb  (81.6 kg)  06/09/17 180 lb (81.6 kg)     Review of Systems  Constitutional: Negative for fatigue and fever.  HENT: Negative for ear pain.   Eyes: Negative for pain.  Respiratory: Negative for cough and shortness of breath.   Cardiovascular: Negative for chest pain, palpitations and leg swelling.  Gastrointestinal: Negative for abdominal pain.  Genitourinary: Negative for dysuria.  Musculoskeletal: Negative for arthralgias.  Neurological: Negative for syncope, light-headedness and headaches.  Psychiatric/Behavioral: Negative for dysphoric mood.       Objective:   Physical Exam  Constitutional: Vital signs are normal. He appears well-developed and well-nourished.  Thin appearing male in NAD   no peripheral edema  HENT:  Head: Normocephalic.  Right Ear: Hearing normal.  Left Ear: Hearing normal.  Nose: Nose normal.  Mouth/Throat: Oropharynx is clear and moist and mucous membranes are normal.  Neck: Trachea normal. Carotid bruit is not present. No thyroid mass and no thyromegaly present.  Cardiovascular: Normal rate, regular rhythm and normal pulses. Exam reveals no gallop, no distant heart sounds and no friction rub.  No murmur heard. No peripheral edema  Pulmonary/Chest: Effort normal and breath sounds normal. No respiratory distress.  Musculoskeletal:  Right knee with bandage, warmth and swelling in right knee ,  decreased ROM, pain with movement.  Skin: Skin is warm, dry and intact. No rash noted.  Psychiatric: He has a normal mood and affect. His speech is normal and behavior is normal. Thought content normal.          Assessment & Plan:

## 2017-06-11 NOTE — Patient Instructions (Addendum)
Decrease lasix to 1 x 20 mg tablet a day for 1 week.  Return for repeat kidney and liver test

## 2017-06-11 NOTE — Assessment & Plan Note (Signed)
Pt with lower BP at baseline.[name] He feels well except for knee pain.  ? Dehydrated and overdiuresed. ? Side effect of vanc. Decrease lasix, off lisinopril.  Follow up in 1 week.

## 2017-06-11 NOTE — Assessment & Plan Note (Signed)
New, possibly post op anemia.  no bleeding.  Will re-eval cbc for change given hypotension. Will also re-eval iron levels.

## 2017-06-11 NOTE — Assessment & Plan Note (Signed)
Hx of fatty liver, but family history of unknown liver issue.  Using tylenol but no excessive.  Repeat eval in [redacted] week along with hepatitis panel.

## 2017-06-11 NOTE — Assessment & Plan Note (Signed)
Worsened control in last few weeks with knee issues. ON prozac. Refilled lorazepma to use in limit fahion given hypotension. If not improving may need increase in dose of prozac.

## 2017-06-11 NOTE — Assessment & Plan Note (Signed)
HAs appt with ortho today.

## 2017-06-11 NOTE — Assessment & Plan Note (Signed)
On vanc day 8/15... ortho does not feel septic joint.

## 2017-06-12 ENCOUNTER — Encounter (HOSPITAL_COMMUNITY)
Admission: RE | Admit: 2017-06-12 | Discharge: 2017-06-12 | Disposition: A | Discharge status: Home / Self Care | Payer: Medicare Other | Source: Ambulatory Visit | Attending: Orthopaedic Surgery | Admitting: Orthopaedic Surgery

## 2017-06-12 DIAGNOSIS — Y838 Other surgical procedures as the cause of abnormal reaction of the patient, or of later complication, without mention of misadventure at the time of the procedure: Secondary | ICD-10-CM | POA: Insufficient documentation

## 2017-06-12 DIAGNOSIS — T8453XA Infection and inflammatory reaction due to internal right knee prosthesis, initial encounter: Secondary | ICD-10-CM | POA: Insufficient documentation

## 2017-06-12 DIAGNOSIS — A4902 Methicillin resistant Staphylococcus aureus infection, unspecified site: Secondary | ICD-10-CM | POA: Insufficient documentation

## 2017-06-12 MED ORDER — HEPARIN SOD (PORK) LOCK FLUSH 100 UNIT/ML IV SOLN: 250.0000 [IU] | Freq: Every day | INTRAVENOUS | Status: DC

## 2017-06-12 MED ORDER — HEPARIN SOD (PORK) LOCK FLUSH 100 UNIT/ML IV SOLN
250.0000 [IU] | INTRAVENOUS | Status: DC | PRN
  Administered 2017-06-12: 250 [IU]

## 2017-06-12 MED ORDER — VANCOMYCIN HCL 10 G IV SOLR
1250.0000 mg | INTRAVENOUS | Status: DC
  Administered 2017-06-12: 1250 mg via INTRAVENOUS
  Filled 2017-06-12: qty 1250

## 2017-06-13 ENCOUNTER — Encounter (HOSPITAL_COMMUNITY)
Admission: RE | Admit: 2017-06-13 | Discharge: 2017-06-13 | Disposition: A | Discharge status: Home / Self Care | Payer: Medicare Other | Source: Ambulatory Visit | Attending: Orthopaedic Surgery | Admitting: Orthopaedic Surgery

## 2017-06-13 DIAGNOSIS — T8453XA Infection and inflammatory reaction due to internal right knee prosthesis, initial encounter: Secondary | ICD-10-CM | POA: Diagnosis not present

## 2017-06-13 DIAGNOSIS — A4902 Methicillin resistant Staphylococcus aureus infection, unspecified site: Secondary | ICD-10-CM | POA: Diagnosis not present

## 2017-06-13 MED ORDER — VANCOMYCIN HCL 10 G IV SOLR
1250.0000 mg | INTRAVENOUS | Status: DC
  Administered 2017-06-13: 1250 mg via INTRAVENOUS
  Filled 2017-06-13: qty 1250

## 2017-06-13 MED ORDER — HEPARIN SOD (PORK) LOCK FLUSH 100 UNIT/ML IV SOLN
250.0000 [IU] | Freq: Every day | INTRAVENOUS | Status: DC
  Administered 2017-06-13: 250 [IU]

## 2017-06-13 MED ORDER — HEPARIN SOD (PORK) LOCK FLUSH 100 UNIT/ML IV SOLN: 250.0000 [IU] | INTRAVENOUS | Status: DC | PRN

## 2017-06-14 ENCOUNTER — Ambulatory Visit (HOSPITAL_COMMUNITY)
Admission: RE | Admit: 2017-06-14 | Discharge: 2017-06-14 | Disposition: A | Discharge status: Home / Self Care | Payer: Medicare Other | Source: Ambulatory Visit | Attending: Orthopaedic Surgery | Admitting: Orthopaedic Surgery

## 2017-06-14 DIAGNOSIS — A4902 Methicillin resistant Staphylococcus aureus infection, unspecified site: Secondary | ICD-10-CM | POA: Diagnosis not present

## 2017-06-14 MED ORDER — HEPARIN SOD (PORK) LOCK FLUSH 100 UNIT/ML IV SOLN: 250.0000 [IU] | Freq: Every day | INTRAVENOUS | Status: DC

## 2017-06-14 MED ORDER — HEPARIN SOD (PORK) LOCK FLUSH 100 UNIT/ML IV SOLN
INTRAVENOUS | Status: AC
  Administered 2017-06-14: 250 [IU]
  Filled 2017-06-14: qty 5

## 2017-06-14 MED ORDER — VANCOMYCIN HCL 10 G IV SOLR
1250.0000 mg | INTRAVENOUS | Status: DC
  Administered 2017-06-14: 1250 mg via INTRAVENOUS
  Filled 2017-06-14: qty 1250

## 2017-06-14 MED ORDER — HEPARIN SOD (PORK) LOCK FLUSH 100 UNIT/ML IV SOLN: 250.0000 [IU] | INTRAVENOUS | Status: DC | PRN

## 2017-06-15 ENCOUNTER — Ambulatory Visit (INDEPENDENT_AMBULATORY_CARE_PROVIDER_SITE_OTHER): Payer: Medicare Other

## 2017-06-15 ENCOUNTER — Other Ambulatory Visit (HOSPITAL_COMMUNITY): Payer: Self-pay | Admitting: *Deleted

## 2017-06-15 ENCOUNTER — Ambulatory Visit (HOSPITAL_COMMUNITY)
Admission: RE | Admit: 2017-06-15 | Discharge: 2017-06-15 | Disposition: A | Discharge status: Home / Self Care | Payer: Medicare Other | Source: Ambulatory Visit | Attending: Orthopaedic Surgery | Admitting: Orthopaedic Surgery

## 2017-06-15 DIAGNOSIS — Z9889 Other specified postprocedural states: Secondary | ICD-10-CM | POA: Diagnosis not present

## 2017-06-15 DIAGNOSIS — Z96651 Presence of right artificial knee joint: Secondary | ICD-10-CM | POA: Insufficient documentation

## 2017-06-15 DIAGNOSIS — I4891 Unspecified atrial fibrillation: Secondary | ICD-10-CM | POA: Diagnosis not present

## 2017-06-15 DIAGNOSIS — Z7901 Long term (current) use of anticoagulants: Secondary | ICD-10-CM | POA: Diagnosis not present

## 2017-06-15 DIAGNOSIS — B9562 Methicillin resistant Staphylococcus aureus infection as the cause of diseases classified elsewhere: Secondary | ICD-10-CM | POA: Diagnosis not present

## 2017-06-15 DIAGNOSIS — I824Y9 Acute embolism and thrombosis of unspecified deep veins of unspecified proximal lower extremity: Secondary | ICD-10-CM | POA: Diagnosis not present

## 2017-06-15 DIAGNOSIS — Z5181 Encounter for therapeutic drug level monitoring: Secondary | ICD-10-CM | POA: Diagnosis not present

## 2017-06-15 LAB — POCT INR: INR: 2.1

## 2017-06-15 MED ORDER — HEPARIN SOD (PORK) LOCK FLUSH 100 UNIT/ML IV SOLN
250.0000 [IU] | Freq: Every day | INTRAVENOUS | Status: DC
  Administered 2017-06-15: 250 [IU]

## 2017-06-15 MED ORDER — HEPARIN SOD (PORK) LOCK FLUSH 100 UNIT/ML IV SOLN
INTRAVENOUS | Status: AC
  Administered 2017-06-15: 250 [IU]
  Filled 2017-06-15: qty 5

## 2017-06-15 MED ORDER — VANCOMYCIN HCL 10 G IV SOLR
1250.0000 mg | INTRAVENOUS | Status: DC
  Administered 2017-06-15: 1250 mg via INTRAVENOUS
  Filled 2017-06-15: qty 1250

## 2017-06-15 MED ORDER — HEPARIN SOD (PORK) LOCK FLUSH 100 UNIT/ML IV SOLN: 250.0000 [IU] | INTRAVENOUS | Status: DC | PRN

## 2017-06-15 NOTE — Patient Instructions (Signed)
Description   Take 2 tablets today, then resume same dosage 1.5 tablets everyday except 1 tablet on Sundays and Thursdays. Recheck in 2 weeks. Call if you start any new medications [phone].

## 2017-06-16 ENCOUNTER — Ambulatory Visit (HOSPITAL_COMMUNITY)
Admission: RE | Admit: 2017-06-16 | Discharge: 2017-06-16 | Disposition: A | Discharge status: Home / Self Care | Payer: Medicare Other | Source: Ambulatory Visit | Attending: Orthopaedic Surgery | Admitting: Orthopaedic Surgery

## 2017-06-16 DIAGNOSIS — A4902 Methicillin resistant Staphylococcus aureus infection, unspecified site: Secondary | ICD-10-CM | POA: Insufficient documentation

## 2017-06-16 MED ORDER — HEPARIN SOD (PORK) LOCK FLUSH 100 UNIT/ML IV SOLN
INTRAVENOUS | Status: AC
  Administered 2017-06-16: 250 [IU]
  Filled 2017-06-16: qty 5

## 2017-06-16 MED ORDER — VANCOMYCIN HCL 10 G IV SOLR
1250.0000 mg | INTRAVENOUS | Status: DC
  Administered 2017-06-16: 1250 mg via INTRAVENOUS
  Filled 2017-06-16: qty 1250

## 2017-06-16 MED ORDER — HEPARIN SOD (PORK) LOCK FLUSH 100 UNIT/ML IV SOLN
250.0000 [IU] | Freq: Every day | INTRAVENOUS | Status: DC
  Administered 2017-06-16: 250 [IU]

## 2017-06-16 MED ORDER — HEPARIN SOD (PORK) LOCK FLUSH 100 UNIT/ML IV SOLN: 250.0000 [IU] | INTRAVENOUS | Status: DC | PRN

## 2017-06-17 ENCOUNTER — Ambulatory Visit (HOSPITAL_COMMUNITY)
Admission: RE | Admit: 2017-06-17 | Discharge: 2017-06-17 | Disposition: A | Discharge status: Home / Self Care | Payer: Medicare Other | Source: Ambulatory Visit | Attending: Orthopaedic Surgery | Admitting: Orthopaedic Surgery

## 2017-06-17 ENCOUNTER — Encounter: Payer: Self-pay | Admitting: *Deleted

## 2017-06-17 ENCOUNTER — Other Ambulatory Visit (INDEPENDENT_AMBULATORY_CARE_PROVIDER_SITE_OTHER): Payer: Medicare Other

## 2017-06-17 DIAGNOSIS — D649 Anemia, unspecified: Secondary | ICD-10-CM | POA: Diagnosis not present

## 2017-06-17 DIAGNOSIS — Z792 Long term (current) use of antibiotics: Secondary | ICD-10-CM | POA: Insufficient documentation

## 2017-06-17 DIAGNOSIS — R945 Abnormal results of liver function studies: Secondary | ICD-10-CM | POA: Diagnosis not present

## 2017-06-17 DIAGNOSIS — N179 Acute kidney failure, unspecified: Secondary | ICD-10-CM | POA: Diagnosis not present

## 2017-06-17 DIAGNOSIS — Z9889 Other specified postprocedural states: Secondary | ICD-10-CM | POA: Insufficient documentation

## 2017-06-17 DIAGNOSIS — A4902 Methicillin resistant Staphylococcus aureus infection, unspecified site: Secondary | ICD-10-CM | POA: Insufficient documentation

## 2017-06-17 DIAGNOSIS — R7989 Other specified abnormal findings of blood chemistry: Secondary | ICD-10-CM

## 2017-06-17 DIAGNOSIS — Z96651 Presence of right artificial knee joint: Secondary | ICD-10-CM | POA: Insufficient documentation

## 2017-06-17 LAB — CBC WITH DIFFERENTIAL/PLATELET
Basophils Absolute: 0.1 10*3/uL (ref 0.0–0.1)
Basophils Relative: 1.5 % (ref 0.0–3.0)
EOS PCT: 3.3 % (ref 0.0–5.0)
Eosinophils Absolute: 0.2 10*3/uL (ref 0.0–0.7)
HEMATOCRIT: 32.6 % — AB (ref 39.0–52.0)
HEMOGLOBIN: 11.1 g/dL — AB (ref 13.0–17.0)
Lymphocytes Relative: 19 % (ref 12.0–46.0)
Lymphs Abs: 1 10*3/uL (ref 0.7–4.0)
MCHC: 34.1 g/dL (ref 30.0–36.0)
MCV: 94.2 fl (ref 78.0–100.0)
MONOS PCT: 11.4 % (ref 3.0–12.0)
Monocytes Absolute: 0.6 10*3/uL (ref 0.1–1.0)
Neutro Abs: 3.3 10*3/uL (ref 1.4–7.7)
Neutrophils Relative %: 64.8 % (ref 43.0–77.0)
Platelets: 235 10*3/uL (ref 150.0–400.0)
RBC: 3.46 Mil/uL — AB (ref 4.22–5.81)
RDW: 14.7 % (ref 11.5–15.5)
WBC: 5.1 10*3/uL (ref 4.0–10.5)

## 2017-06-17 LAB — HEPATIC FUNCTION PANEL
ALK PHOS: 73 U/L (ref 39–117)
ALT: 18 U/L (ref 0–53)
AST: 18 U/L (ref 0–37)
Albumin: 3.7 g/dL (ref 3.5–5.2)
BILIRUBIN DIRECT: 0.1 mg/dL (ref 0.0–0.3)
BILIRUBIN TOTAL: 0.4 mg/dL (ref 0.2–1.2)
TOTAL PROTEIN: 6.4 g/dL (ref 6.0–8.3)

## 2017-06-17 LAB — BASIC METABOLIC PANEL
BUN: 15 mg/dL (ref 6–23)
CHLORIDE: 105 meq/L (ref 96–112)
CO2: 26 mEq/L (ref 19–32)
Calcium: 8.7 mg/dL (ref 8.4–10.5)
Creatinine, Ser: 1.38 mg/dL (ref 0.40–1.50)
GFR: 53.29 mL/min — ABNORMAL LOW (ref >60.00)
GLUCOSE: 155 mg/dL — AB (ref 70–99)
POTASSIUM: 4.7 meq/L (ref 3.5–5.1)
SODIUM: 137 meq/L (ref 135–145)

## 2017-06-17 LAB — IBC PANEL
IRON: 73 ug/dL (ref 42–165)
Saturation Ratios: 26.2 % (ref 20.0–50.0)
Transferrin: 199 mg/dL — ABNORMAL LOW (ref 212.0–360.0)

## 2017-06-17 LAB — FERRITIN: Ferritin: 86 ng/mL (ref 22.0–322.0)

## 2017-06-17 MED ORDER — HEPARIN SOD (PORK) LOCK FLUSH 100 UNIT/ML IV SOLN
INTRAVENOUS | Status: AC
  Administered 2017-06-17: 250 [IU]
  Filled 2017-06-17: qty 5

## 2017-06-17 MED ORDER — VANCOMYCIN HCL 10 G IV SOLR
1250.0000 mg | INTRAVENOUS | Status: DC
  Administered 2017-06-17: 1250 mg via INTRAVENOUS
  Filled 2017-06-17: qty 1250

## 2017-06-17 MED ORDER — HEPARIN SOD (PORK) LOCK FLUSH 100 UNIT/ML IV SOLN: 250.0000 [IU] | INTRAVENOUS | Status: DC | PRN

## 2017-06-17 MED ORDER — HEPARIN SOD (PORK) LOCK FLUSH 100 UNIT/ML IV SOLN
250.0000 [IU] | Freq: Every day | INTRAVENOUS | Status: DC
  Administered 2017-06-17: 250 [IU]

## 2017-06-18 ENCOUNTER — Ambulatory Visit (HOSPITAL_COMMUNITY)
Admission: RE | Admit: 2017-06-18 | Discharge: 2017-06-18 | Disposition: A | Discharge status: Home / Self Care | Payer: Medicare Other | Source: Ambulatory Visit | Attending: Orthopaedic Surgery | Admitting: Orthopaedic Surgery

## 2017-06-18 ENCOUNTER — Telehealth: Payer: Self-pay

## 2017-06-18 ENCOUNTER — Ambulatory Visit: Payer: Medicare Other | Admitting: Family Medicine

## 2017-06-18 DIAGNOSIS — H04123 Dry eye syndrome of bilateral lacrimal glands: Secondary | ICD-10-CM | POA: Diagnosis not present

## 2017-06-18 DIAGNOSIS — A4902 Methicillin resistant Staphylococcus aureus infection, unspecified site: Secondary | ICD-10-CM | POA: Insufficient documentation

## 2017-06-18 DIAGNOSIS — Z452 Encounter for adjustment and management of vascular access device: Secondary | ICD-10-CM | POA: Insufficient documentation

## 2017-06-18 LAB — HEPATITIS PANEL, ACUTE
HEP B C IGM: NONREACTIVE
HEP C AB: NONREACTIVE
Hep A IgM: NONREACTIVE
Hepatitis B Surface Ag: NONREACTIVE
SIGNAL TO CUT-OFF: 0.02 (ref <1.00)

## 2017-06-18 MED ORDER — VANCOMYCIN HCL 10 G IV SOLR
1250.0000 mg | INTRAVENOUS | Status: DC
  Administered 2017-06-18: 1250 mg via INTRAVENOUS
  Filled 2017-06-18: qty 1250

## 2017-06-18 NOTE — Telephone Encounter (Signed)
I spoke with pts daughter Drue Dun and she is waiting for her car from the [place] and they will be on their way; Drue Dun said they took pts BP but she does not know what it was. Dr Diona Browner said OK come on and try to be at [facility] by 3:15. Krisitie voiced understanding and will be here ASAP.

## 2017-06-18 NOTE — Telephone Encounter (Signed)
Copied from [place] [id number]. Topic: Quick Communication - Patient Running Late >> Jun 18, 2017  2:23 PM Margot Ables wrote: Patient called and is running 30 minutes late. I talked with Ozzie Hoyle and sent her a skype  2:22pm when I got back on the phone pts daughter said they are currently removing his PICC line. They said he has to wait 30 minutes before leaving. She said she is driving and it takes her about 20-25 minutes and she'll get him there as quick as she can.  Route to department's PEC pool.

## 2017-06-18 NOTE — Telephone Encounter (Signed)
Pt called back and BP nml. I will call them with labs results.

## 2017-06-18 NOTE — Progress Notes (Signed)
Per MD order, PICC line removed. Cath intact at 38cm. Vaseline pressure gauze to site, pressure held x 5min. No bleeding to site. Pt instructed to keep dressing CDI x 24 hours. Avoid heavy lifting, pushing or pulling x 24 hours,  If bleeding occurs hold pressure, if bleeding does not stop contact MD or go to the ED. Pt does not have any questions. Andrew Blasius M  

## 2017-06-22 ENCOUNTER — Ambulatory Visit (INDEPENDENT_AMBULATORY_CARE_PROVIDER_SITE_OTHER): Payer: Medicare Other | Admitting: Family Medicine

## 2017-06-22 ENCOUNTER — Encounter: Payer: Self-pay | Admitting: Family Medicine

## 2017-06-22 DIAGNOSIS — N179 Acute kidney failure, unspecified: Secondary | ICD-10-CM

## 2017-06-22 DIAGNOSIS — F5104 Psychophysiologic insomnia: Secondary | ICD-10-CM | POA: Diagnosis not present

## 2017-06-22 DIAGNOSIS — L89511 Pressure ulcer of right ankle, stage 1: Secondary | ICD-10-CM | POA: Diagnosis not present

## 2017-06-22 DIAGNOSIS — A4902 Methicillin resistant Staphylococcus aureus infection, unspecified site: Secondary | ICD-10-CM | POA: Diagnosis not present

## 2017-06-22 DIAGNOSIS — I959 Hypotension, unspecified: Secondary | ICD-10-CM

## 2017-06-22 DIAGNOSIS — D638 Anemia in other chronic diseases classified elsewhere: Secondary | ICD-10-CM | POA: Diagnosis not present

## 2017-06-22 MED ORDER — ZOLPIDEM TARTRATE ER 6.25 MG PO TBCR: 6.2500 mg | EXTENDED_RELEASE_TABLET | Freq: Every evening | ORAL | 0 refills | Status: DC | PRN

## 2017-06-22 NOTE — Assessment & Plan Note (Signed)
Resolved

## 2017-06-22 NOTE — Patient Instructions (Signed)
Check you dose of Ambien .[name] If taking 1/2 tablet at bedtime you can take a second half tablet if needed to help with sleep.  If  you are already taking 10 mg.. Can look into Ambien long acting.  Apply Tegaderm to right ankle.

## 2017-06-22 NOTE — Assessment & Plan Note (Signed)
Pt using lasix prn. Lower than usual BP may be diue to not drinking much and SE of vanc. Now off vanc, push fluids and follow.  pt asymptomatic.

## 2017-06-22 NOTE — Progress Notes (Signed)
   Subjective:    Patient ID: [name], male    DOB: Nov 16, 1940, 77 y.o.   MRN: [id number]  HPI  77 year old male presents for follow up multiple issues.   he is currently being treated  Via PICC with vancomycin for MRSA infection of skin of knee at location of joint replacement.. No clear spetic knee per ORTHO.   He has been noted to have low BP at baseline, but has been lower in our office lately. Per IV infusions.. It has been 90/60s.  He denies lightheadedness. Possible SE of vanc. He has now completed the IV antibitoics.  PIcc line removed.  off lisinopril.  Asked to use 20 mg only x1 week  Using lasix 40 mg daily   A on CRF: improved with recent lab check.  Recent LFT elevation has resolved and now AST and ALSt are in nml range  he is trying to decrease tylenol and vicodin use.  Has evidence of improving anemia, likely now of chronic disease given low transferring and nml total iron and ferritin.  Pressure ulcer on right ankle. No discharge, just sore.   Having trouble with going back to sleep after waking with pain in right knee.  Feels Ambien not working once he wakes up to take vicodin for pain. He is not sure if taking 1/2 tab or 1 tab for sleep.. Wife gives him med.    Review of Systems  Constitutional: Negative for fatigue and fever.  HENT: Negative for ear pain.   Eyes: Negative for pain.  Respiratory: Negative for cough and shortness of breath.   Cardiovascular: Positive for leg swelling. Negative for chest pain and palpitations.  Gastrointestinal: Negative for abdominal pain.  Genitourinary: Negative for dysuria.  Musculoskeletal: Negative for arthralgias.  Neurological: Negative for syncope, light-headedness and headaches.  Psychiatric/Behavioral: Negative for dysphoric mood.       Objective:   Physical Exam  Constitutional: Vital signs are normal. He appears well-developed and well-nourished.  Thin appearing male in NAD   no peripheral edema   HENT:  Head: Normocephalic.  Right Ear: Hearing normal.  Left Ear: Hearing normal.  Nose: Nose normal.  Mouth/Throat: Oropharynx is clear and moist and mucous membranes are normal.  Neck: Trachea normal. Carotid bruit is not present. No thyroid mass and no thyromegaly present.  Cardiovascular: Normal rate, regular rhythm and normal pulses. Exam reveals no gallop, no distant heart sounds and no friction rub.  No murmur heard. No peripheral edema  Pulmonary/Chest: Effort normal and breath sounds normal. No respiratory distress.  Musculoskeletal:  Right knee  warmth and  Mild swelling in right knee , no skin lesion, decreased ROM, pain with movement.   erythema and scab on right ankle over malleolus.  Skin: Skin is warm, dry and intact. No rash noted.  Psychiatric: He has a normal mood and affect. His speech is normal and behavior is normal. Thought content normal.          Assessment & Plan:

## 2017-06-22 NOTE — Assessment & Plan Note (Signed)
Reminded pt it is not safe to take more than 10 mg ambien at night.  He will verify his dose. IF already on max he can see cost of long acting Ambien.

## 2017-06-22 NOTE — Assessment & Plan Note (Signed)
No sign of infection. Use tegaderm to help healing.

## 2017-06-22 NOTE — Assessment & Plan Note (Signed)
No sign of this as cause of hypotension. Iron nml except transferring.

## 2017-06-22 NOTE — Assessment & Plan Note (Signed)
Has now completed MRSA  Treatement. Completed vanc infusions.

## 2017-06-23 ENCOUNTER — Telehealth: Payer: Self-pay | Admitting: *Deleted

## 2017-06-23 NOTE — Telephone Encounter (Signed)
[name] stopped by office and brought in his Zolpidem 10 mg.  He wanted to give them to me and let Dr. Diona Browner know he is not going to be taking the Zolpidem 10 mg.  He did get the Zolpidem CR 6.25 mg and took on last night and states it worked well.  I advised him that  we could no long discard medication here at the office anymore and that he would have to take it to the [facility] office if he wants to get rid of it.  He states he will just put it away at home but wanted to make sure Dr. Diona Browner knows he is not going to take it anymore.

## 2017-06-24 NOTE — Telephone Encounter (Signed)
Noted  

## 2017-06-25 DIAGNOSIS — Z96653 Presence of artificial knee joint, bilateral: Secondary | ICD-10-CM | POA: Diagnosis not present

## 2017-07-01 ENCOUNTER — Other Ambulatory Visit: Payer: Self-pay | Admitting: *Deleted

## 2017-07-01 ENCOUNTER — Ambulatory Visit (INDEPENDENT_AMBULATORY_CARE_PROVIDER_SITE_OTHER): Payer: Medicare Other

## 2017-07-01 DIAGNOSIS — Z7901 Long term (current) use of anticoagulants: Secondary | ICD-10-CM

## 2017-07-01 DIAGNOSIS — Z5181 Encounter for therapeutic drug level monitoring: Secondary | ICD-10-CM | POA: Diagnosis not present

## 2017-07-01 DIAGNOSIS — I824Y9 Acute embolism and thrombosis of unspecified deep veins of unspecified proximal lower extremity: Secondary | ICD-10-CM | POA: Diagnosis not present

## 2017-07-01 DIAGNOSIS — I4891 Unspecified atrial fibrillation: Secondary | ICD-10-CM | POA: Diagnosis not present

## 2017-07-01 LAB — POCT INR: INR: 1.7 — AB (ref 2.0–3.0)

## 2017-07-01 NOTE — Patient Instructions (Addendum)
Description   Take 2 tablets today, then start taking 1.5 tablets everyday except 1 tablet on Thursdays. Recheck in 2 weeks. Call if you start any new medications [phone].

## 2017-07-02 ENCOUNTER — Ambulatory Visit (INDEPENDENT_AMBULATORY_CARE_PROVIDER_SITE_OTHER): Payer: Medicare Other | Admitting: Family Medicine

## 2017-07-02 ENCOUNTER — Encounter: Payer: Self-pay | Admitting: Family Medicine

## 2017-07-02 VITALS — BP 125/76 | HR 89 | Temp 97.9°F | Ht 72.0 in | Wt 187.8 lb

## 2017-07-02 DIAGNOSIS — F411 Generalized anxiety disorder: Secondary | ICD-10-CM

## 2017-07-02 DIAGNOSIS — H04123 Dry eye syndrome of bilateral lacrimal glands: Secondary | ICD-10-CM | POA: Diagnosis not present

## 2017-07-02 DIAGNOSIS — F330 Major depressive disorder, recurrent, mild: Secondary | ICD-10-CM

## 2017-07-02 MED ORDER — FLUOXETINE HCL 60 MG PO TABS: ORAL_TABLET | ORAL | 3 refills | Status: DC

## 2017-07-02 NOTE — Patient Instructions (Addendum)
Increase prozac to  60 mg daily. Stop lower dose 40 mg daily.  Get outside as much as able, walk as much as able.  Use ambien at bedtime.

## 2017-07-02 NOTE — Assessment & Plan Note (Signed)
Poor control. Increase fluoxetine to 60 mg daily. Follow up in 4 weeks.  if not improving, consider change to Cymbalta.  Offered counseling. Refused.

## 2017-07-02 NOTE — Progress Notes (Addendum)
   Subjective:    Patient ID: [name], male    DOB: 11/16/1940, 77 y.o.   MRN: [id number]  HPI  77 year old male presents for worsening MDD, GAD and chronic insomnia.   He reports with his new poorly controlled post op knee pain.[name] He has had a worsening of his mood. He is frustrated, cannot do anything he wants to do. Cannot get outside. He feels anxious, irritable. He feels guilty about stress he has caused wife.   He is on 40 mg prozac daily. He has tried Ambien CR at night for sleep since Ambien was not helping enough. He uses ativan  1/2 tablet twice daily as needed for anxiety.  Depression screen [facility] 2/9 07/02/2017 10/30/2016 09/11/2016  Decreased Interest 3 0 0  Down, Depressed, Hopeless 3 0 0  PHQ - 2 Score 6 0 0  Altered sleeping 3 - -  Tired, decreased energy 3 - -  Change in appetite 3 - -  Feeling bad or failure about yourself  0 - -  Trouble concentrating 3 - -  Moving slowly or fidgety/restless 3 - -  Suicidal thoughts 0 - -  PHQ-9 Score 21 - -  Difficult doing work/chores Very difficult - -  Some recent data might be hidden   Blood pressure 125/76, pulse 89, temperature 97.9 F (36.6 C), temperature source Oral, height 6' (1.829 m), weight 187 lb 12 oz (85.2 kg). Social History /Family History/Past Medical History reviewed in detail and updated in EMR if needed.  Review of Systems  Constitutional: Negative for fatigue and fever.  HENT: Negative for ear pain.   Eyes: Negative for pain.  Respiratory: Negative for cough and shortness of breath.   Cardiovascular: Negative for chest pain, palpitations and leg swelling.  Gastrointestinal: Negative for abdominal pain.  Genitourinary: Negative for dysuria.  Musculoskeletal: Negative for arthralgias.  Neurological: Negative for syncope, light-headedness and headaches.  Psychiatric/Behavioral: Negative for dysphoric mood.       Objective:   Physical Exam  Constitutional: Vital signs are normal. He appears  well-developed and well-nourished.  HENT:  Head: Normocephalic.  Right Ear: Hearing normal.  Left Ear: Hearing normal.  Nose: Nose normal.  Mouth/Throat: Oropharynx is clear and moist and mucous membranes are normal.  Neck: Trachea normal. Carotid bruit is not present. No thyroid mass and no thyromegaly present.  Cardiovascular: Normal rate, regular rhythm and normal pulses. Exam reveals no gallop, no distant heart sounds and no friction rub.  No murmur heard. No peripheral edema  Pulmonary/Chest: Effort normal and breath sounds normal. No respiratory distress.  Skin: Skin is warm, dry and intact. No rash noted.  Psychiatric: His speech is normal. Thought content normal. His affect is blunt. He is slowed and withdrawn.          Assessment & Plan:

## 2017-07-09 DIAGNOSIS — M50322 Other cervical disc degeneration at C5-C6 level: Secondary | ICD-10-CM | POA: Diagnosis not present

## 2017-07-09 DIAGNOSIS — M25561 Pain in right knee: Secondary | ICD-10-CM | POA: Diagnosis not present

## 2017-07-09 DIAGNOSIS — R262 Difficulty in walking, not elsewhere classified: Secondary | ICD-10-CM | POA: Diagnosis not present

## 2017-07-09 DIAGNOSIS — M25661 Stiffness of right knee, not elsewhere classified: Secondary | ICD-10-CM | POA: Diagnosis not present

## 2017-07-09 DIAGNOSIS — M9901 Segmental and somatic dysfunction of cervical region: Secondary | ICD-10-CM | POA: Diagnosis not present

## 2017-07-13 DIAGNOSIS — M25561 Pain in right knee: Secondary | ICD-10-CM | POA: Diagnosis not present

## 2017-07-13 DIAGNOSIS — R262 Difficulty in walking, not elsewhere classified: Secondary | ICD-10-CM | POA: Diagnosis not present

## 2017-07-13 DIAGNOSIS — M25661 Stiffness of right knee, not elsewhere classified: Secondary | ICD-10-CM | POA: Diagnosis not present

## 2017-07-14 ENCOUNTER — Other Ambulatory Visit: Payer: Self-pay | Admitting: Family Medicine

## 2017-07-15 ENCOUNTER — Telehealth: Payer: Self-pay | Admitting: Family Medicine

## 2017-07-15 ENCOUNTER — Ambulatory Visit (INDEPENDENT_AMBULATORY_CARE_PROVIDER_SITE_OTHER): Payer: Medicare Other

## 2017-07-15 DIAGNOSIS — I4891 Unspecified atrial fibrillation: Secondary | ICD-10-CM

## 2017-07-15 DIAGNOSIS — R262 Difficulty in walking, not elsewhere classified: Secondary | ICD-10-CM | POA: Diagnosis not present

## 2017-07-15 DIAGNOSIS — M25661 Stiffness of right knee, not elsewhere classified: Secondary | ICD-10-CM | POA: Diagnosis not present

## 2017-07-15 DIAGNOSIS — Z5181 Encounter for therapeutic drug level monitoring: Secondary | ICD-10-CM

## 2017-07-15 DIAGNOSIS — Z7901 Long term (current) use of anticoagulants: Secondary | ICD-10-CM

## 2017-07-15 DIAGNOSIS — I824Y9 Acute embolism and thrombosis of unspecified deep veins of unspecified proximal lower extremity: Secondary | ICD-10-CM | POA: Diagnosis not present

## 2017-07-15 DIAGNOSIS — M25561 Pain in right knee: Secondary | ICD-10-CM | POA: Diagnosis not present

## 2017-07-15 LAB — POCT INR: INR: 2.7 (ref 2.0–3.0)

## 2017-07-15 NOTE — Telephone Encounter (Signed)
Pt last seen 07/02/17.Please advise.

## 2017-07-15 NOTE — Telephone Encounter (Signed)
Agree with trying the 12.5 mg daily of the  AmbeinCR is fine.  If this does not work after 1 week and he is not pleased with 5 hours of sleep a night we will try a differnet option.

## 2017-07-15 NOTE — Telephone Encounter (Signed)
[name] notified as instructed by telephone.  He states he has not been taking the Ambien CR 6.25 mg because it did not work.  He started taking his regular Ambien 10 mg again but is only getting about 5 to 5/1/2 hours of sleep a night.  He does have some of the 6.25 mg tablets left.  He states it is up to Dr. Diona Browner as to what she would like to do.  I mentioned maybe taking 2 of the Ambien CR 6.25 mg nightly for 12.5 mg total but advised I would send this note back to Dr. Diona Browner to see what she would like to him to take.  Please advise.

## 2017-07-15 NOTE — Telephone Encounter (Signed)
Let him know we can try alternate medications if he would like.. Can try amitriptyline, seroquel, lunesta etc. There are many options, I can pick another trial or he can make appt to discuss if he would like ( he should have a 4 week mood follow up coming up). Or he can continue Ambien for now.

## 2017-07-15 NOTE — Telephone Encounter (Signed)
Copied from [place] [phone]. Topic: General - Other >> Jul 15, 2017  8:54 AM Cecelia Byars, NT wrote: Reason for CRM: Patients wife called and said the new prescription for Ambien time released does not work , he wants to go back to the  original form,he has started taking it again and is only sleeping 5 hours on that one, and would like to know is there something else he can take please advise [phone] after  1130 am

## 2017-07-15 NOTE — Telephone Encounter (Signed)
Left message for [name] to return call.  [place] for [facility] Triage Nurse to speak with patient when he calls back.  CRM created.

## 2017-07-15 NOTE — Patient Instructions (Signed)
Description   Continue on same dosage 1.5 tablets everyday except 1 tablet on Thursdays. Recheck in 3 weeks. Call if you start any new medications [phone].

## 2017-07-16 NOTE — Telephone Encounter (Signed)
Pt aware and in agreement to take Ambien CR 6.25mg  2qhs for a week and let us know if not getting 5 hours of sleep nightly/thx dmf

## 2017-07-19 DIAGNOSIS — M25561 Pain in right knee: Secondary | ICD-10-CM | POA: Diagnosis not present

## 2017-07-19 DIAGNOSIS — M25661 Stiffness of right knee, not elsewhere classified: Secondary | ICD-10-CM | POA: Diagnosis not present

## 2017-07-19 DIAGNOSIS — R262 Difficulty in walking, not elsewhere classified: Secondary | ICD-10-CM | POA: Diagnosis not present

## 2017-07-21 DIAGNOSIS — M25561 Pain in right knee: Secondary | ICD-10-CM | POA: Diagnosis not present

## 2017-07-21 DIAGNOSIS — R262 Difficulty in walking, not elsewhere classified: Secondary | ICD-10-CM | POA: Diagnosis not present

## 2017-07-21 DIAGNOSIS — M50322 Other cervical disc degeneration at C5-C6 level: Secondary | ICD-10-CM | POA: Diagnosis not present

## 2017-07-21 DIAGNOSIS — M9901 Segmental and somatic dysfunction of cervical region: Secondary | ICD-10-CM | POA: Diagnosis not present

## 2017-07-21 DIAGNOSIS — M25661 Stiffness of right knee, not elsewhere classified: Secondary | ICD-10-CM | POA: Diagnosis not present

## 2017-07-22 DIAGNOSIS — M9901 Segmental and somatic dysfunction of cervical region: Secondary | ICD-10-CM | POA: Diagnosis not present

## 2017-07-22 DIAGNOSIS — M50322 Other cervical disc degeneration at C5-C6 level: Secondary | ICD-10-CM | POA: Diagnosis not present

## 2017-07-23 DIAGNOSIS — M25661 Stiffness of right knee, not elsewhere classified: Secondary | ICD-10-CM | POA: Diagnosis not present

## 2017-07-23 DIAGNOSIS — R262 Difficulty in walking, not elsewhere classified: Secondary | ICD-10-CM | POA: Diagnosis not present

## 2017-07-23 DIAGNOSIS — M25561 Pain in right knee: Secondary | ICD-10-CM | POA: Diagnosis not present

## 2017-07-26 ENCOUNTER — Other Ambulatory Visit: Payer: Self-pay | Admitting: Family Medicine

## 2017-07-26 DIAGNOSIS — M25661 Stiffness of right knee, not elsewhere classified: Secondary | ICD-10-CM | POA: Diagnosis not present

## 2017-07-26 DIAGNOSIS — M25561 Pain in right knee: Secondary | ICD-10-CM | POA: Diagnosis not present

## 2017-07-26 DIAGNOSIS — R262 Difficulty in walking, not elsewhere classified: Secondary | ICD-10-CM | POA: Diagnosis not present

## 2017-07-26 DIAGNOSIS — M9901 Segmental and somatic dysfunction of cervical region: Secondary | ICD-10-CM | POA: Diagnosis not present

## 2017-07-26 DIAGNOSIS — M50322 Other cervical disc degeneration at C5-C6 level: Secondary | ICD-10-CM | POA: Diagnosis not present

## 2017-07-26 MED ORDER — ZOLPIDEM TARTRATE ER 12.5 MG PO TBCR: 12.5000 mg | EXTENDED_RELEASE_TABLET | Freq: Every evening | ORAL | 0 refills | Status: DC | PRN

## 2017-07-26 NOTE — Telephone Encounter (Signed)
Last office visit 07/02/2017.  Dose has be changed to 12.5 mg qhs prn.  Ok to send in 12.5 mg or change dosing instructions on the 6.25 mg?

## 2017-07-27 DIAGNOSIS — M9901 Segmental and somatic dysfunction of cervical region: Secondary | ICD-10-CM | POA: Diagnosis not present

## 2017-07-27 DIAGNOSIS — M50322 Other cervical disc degeneration at C5-C6 level: Secondary | ICD-10-CM | POA: Diagnosis not present

## 2017-07-28 DIAGNOSIS — M25661 Stiffness of right knee, not elsewhere classified: Secondary | ICD-10-CM | POA: Diagnosis not present

## 2017-07-28 DIAGNOSIS — R262 Difficulty in walking, not elsewhere classified: Secondary | ICD-10-CM | POA: Diagnosis not present

## 2017-07-28 DIAGNOSIS — R1013 Epigastric pain: Secondary | ICD-10-CM | POA: Diagnosis not present

## 2017-07-28 DIAGNOSIS — K219 Gastro-esophageal reflux disease without esophagitis: Secondary | ICD-10-CM | POA: Diagnosis not present

## 2017-07-28 DIAGNOSIS — M9901 Segmental and somatic dysfunction of cervical region: Secondary | ICD-10-CM | POA: Diagnosis not present

## 2017-07-28 DIAGNOSIS — M50322 Other cervical disc degeneration at C5-C6 level: Secondary | ICD-10-CM | POA: Diagnosis not present

## 2017-07-28 DIAGNOSIS — M25561 Pain in right knee: Secondary | ICD-10-CM | POA: Diagnosis not present

## 2017-07-29 ENCOUNTER — Other Ambulatory Visit: Payer: Self-pay | Admitting: Unknown Physician Specialty

## 2017-07-29 DIAGNOSIS — L821 Other seborrheic keratosis: Secondary | ICD-10-CM | POA: Diagnosis not present

## 2017-07-29 DIAGNOSIS — R1013 Epigastric pain: Secondary | ICD-10-CM

## 2017-07-29 DIAGNOSIS — Z85828 Personal history of other malignant neoplasm of skin: Secondary | ICD-10-CM | POA: Diagnosis not present

## 2017-07-29 DIAGNOSIS — L570 Actinic keratosis: Secondary | ICD-10-CM | POA: Diagnosis not present

## 2017-07-29 DIAGNOSIS — K219 Gastro-esophageal reflux disease without esophagitis: Secondary | ICD-10-CM

## 2017-07-29 DIAGNOSIS — L820 Inflamed seborrheic keratosis: Secondary | ICD-10-CM | POA: Diagnosis not present

## 2017-07-29 DIAGNOSIS — L245 Irritant contact dermatitis due to other chemical products: Secondary | ICD-10-CM | POA: Diagnosis not present

## 2017-07-30 DIAGNOSIS — M25661 Stiffness of right knee, not elsewhere classified: Secondary | ICD-10-CM | POA: Diagnosis not present

## 2017-07-30 DIAGNOSIS — R262 Difficulty in walking, not elsewhere classified: Secondary | ICD-10-CM | POA: Diagnosis not present

## 2017-07-30 DIAGNOSIS — M25561 Pain in right knee: Secondary | ICD-10-CM | POA: Diagnosis not present

## 2017-08-02 DIAGNOSIS — M25661 Stiffness of right knee, not elsewhere classified: Secondary | ICD-10-CM | POA: Diagnosis not present

## 2017-08-02 DIAGNOSIS — M25561 Pain in right knee: Secondary | ICD-10-CM | POA: Diagnosis not present

## 2017-08-02 DIAGNOSIS — R262 Difficulty in walking, not elsewhere classified: Secondary | ICD-10-CM | POA: Diagnosis not present

## 2017-08-03 ENCOUNTER — Ambulatory Visit (INDEPENDENT_AMBULATORY_CARE_PROVIDER_SITE_OTHER): Payer: Medicare Other | Admitting: Family Medicine

## 2017-08-03 ENCOUNTER — Encounter: Payer: Self-pay | Admitting: Family Medicine

## 2017-08-03 ENCOUNTER — Encounter: Payer: Self-pay | Admitting: *Deleted

## 2017-08-03 VITALS — BP 109/65 | HR 81 | Temp 97.5°F | Ht 72.0 in | Wt 190.0 lb

## 2017-08-03 DIAGNOSIS — F5104 Psychophysiologic insomnia: Secondary | ICD-10-CM | POA: Diagnosis not present

## 2017-08-03 DIAGNOSIS — F411 Generalized anxiety disorder: Secondary | ICD-10-CM | POA: Diagnosis not present

## 2017-08-03 DIAGNOSIS — F330 Major depressive disorder, recurrent, mild: Secondary | ICD-10-CM | POA: Diagnosis not present

## 2017-08-03 NOTE — Progress Notes (Signed)
   Subjective:    Patient ID: [name], male    DOB: Apr 06, 1940, 77 y.o.   MRN: [id number]  HPI  77 year old male presents for 21 month follow up MDD, insomnia chronic and GAD.    At last OV on 06/2017 his prozac was increased to 60 mg daily   Using lorazepam as needed and Ambien CR for sleep.  Today he reports:  He is sleeping well at night for about 8 hour a night.  Mood is better  on higher dose of prozac. 40-50 % better.  No new SE in higher dose of the medicaiton.  Only occ using lorazepam.. Rarely now.  Right knee is doing much better overall.. Going 2 times a week tp PT... Moving more Depression screen [facility] 2/9 07/02/2017 10/30/2016 09/11/2016  Decreased Interest 3 0 0  Down, Depressed, Hopeless 3 0 0  PHQ - 2 Score 6 0 0  Altered sleeping 3 - -  Tired, decreased energy 3 - -  Change in appetite 3 - -  Feeling bad or failure about yourself  0 - -  Trouble concentrating 3 - -  Moving slowly or fidgety/restless 3 - -  Suicidal thoughts 0 - -  PHQ-9 Score 21 - -  Difficult doing work/chores Very difficult - -  Some recent data might be hidden   Blood pressure 109/65, pulse 81, temperature (!) 97.5 F (36.4 C), temperature source Oral, height 6' (1.829 m), weight 190 lb (86.2 kg).  Review of Systems  Constitutional: Positive for fatigue. Negative for fever.  HENT: Negative for ear pain.   Eyes: Negative for pain.  Respiratory: Negative for cough and shortness of breath.   Cardiovascular: Negative for chest pain, palpitations and leg swelling.  Gastrointestinal: Negative for abdominal pain.  Genitourinary: Negative for dysuria.  Musculoskeletal: Positive for back pain and gait problem. Negative for arthralgias.       Left hip  Neurological: Positive for numbness. Negative for syncope, light-headedness and headaches.  Psychiatric/Behavioral: Negative for dysphoric mood.       Objective:   Physical Exam  Constitutional: Vital signs are normal. He appears  well-developed and well-nourished.  HENT:  Head: Normocephalic.  Right Ear: Hearing normal.  Left Ear: Hearing normal.  Nose: Nose normal.  Mouth/Throat: Oropharynx is clear and moist and mucous membranes are normal.  Neck: Trachea normal. Carotid bruit is not present. No thyroid mass and no thyromegaly present.  Cardiovascular: Normal rate, regular rhythm and normal pulses. Exam reveals no gallop, no distant heart sounds and no friction rub.  No murmur heard. No peripheral edema  Pulmonary/Chest: Effort normal and breath sounds normal. No respiratory distress.  Skin: Skin is warm, dry and intact. No rash noted.  Psychiatric: He has a normal mood and affect. His speech is normal and behavior is normal. Thought content normal.          Assessment & Plan:

## 2017-08-03 NOTE — Patient Instructions (Signed)
Continue current dose of prozac.  Keep up with PT as you are!

## 2017-08-03 NOTE — Assessment & Plan Note (Signed)
Rare use of lorazepam 

## 2017-08-03 NOTE — Assessment & Plan Note (Signed)
Significant improvement in mood on higher dose prozac and with improvement in knee pain.

## 2017-08-03 NOTE — Assessment & Plan Note (Signed)
Good control on insomnia and 8 hour uninteruppted sleep with ambein CR higher dose.

## 2017-08-05 ENCOUNTER — Telehealth: Payer: Self-pay | Admitting: Neurology

## 2017-08-05 ENCOUNTER — Ambulatory Visit (INDEPENDENT_AMBULATORY_CARE_PROVIDER_SITE_OTHER): Payer: Medicare Other

## 2017-08-05 DIAGNOSIS — Z7901 Long term (current) use of anticoagulants: Secondary | ICD-10-CM | POA: Diagnosis not present

## 2017-08-05 DIAGNOSIS — Z5181 Encounter for therapeutic drug level monitoring: Secondary | ICD-10-CM | POA: Diagnosis not present

## 2017-08-05 DIAGNOSIS — I4891 Unspecified atrial fibrillation: Secondary | ICD-10-CM

## 2017-08-05 DIAGNOSIS — I824Y9 Acute embolism and thrombosis of unspecified deep veins of unspecified proximal lower extremity: Secondary | ICD-10-CM

## 2017-08-05 LAB — POCT INR: INR: 3.2 — AB (ref 2.0–3.0)

## 2017-08-05 NOTE — Telephone Encounter (Signed)
Pt scheduled for botox tomorrow and wanted to make sure he was planning on going through with this (sometimes he will come and think that it is a regular appt).  He has not had botox for 6 months so checking to make sure that he is wanting to proceed.

## 2017-08-05 NOTE — Patient Instructions (Signed)
Description   Continue on same dosage 1.5 tablets everyday except 1 tablet on Thursdays. Recheck in 4 weeks. Call if you start any new medications [phone].

## 2017-08-05 NOTE — Telephone Encounter (Signed)
Patient is coming in for Botox tomorrow.

## 2017-08-06 ENCOUNTER — Telehealth: Payer: Self-pay | Admitting: Neurology

## 2017-08-06 ENCOUNTER — Ambulatory Visit (INDEPENDENT_AMBULATORY_CARE_PROVIDER_SITE_OTHER): Payer: Medicare Other | Admitting: Neurology

## 2017-08-06 ENCOUNTER — Encounter

## 2017-08-06 ENCOUNTER — Telehealth: Payer: Self-pay | Admitting: Cardiology

## 2017-08-06 DIAGNOSIS — G43709 Chronic migraine without aura, not intractable, without status migrainosus: Secondary | ICD-10-CM

## 2017-08-06 DIAGNOSIS — IMO0002 Reserved for concepts with insufficient information to code with codable children: Secondary | ICD-10-CM

## 2017-08-06 DIAGNOSIS — R51 Headache: Secondary | ICD-10-CM | POA: Diagnosis not present

## 2017-08-06 DIAGNOSIS — R531 Weakness: Secondary | ICD-10-CM

## 2017-08-06 DIAGNOSIS — R262 Difficulty in walking, not elsewhere classified: Secondary | ICD-10-CM | POA: Diagnosis not present

## 2017-08-06 DIAGNOSIS — M25661 Stiffness of right knee, not elsewhere classified: Secondary | ICD-10-CM | POA: Diagnosis not present

## 2017-08-06 DIAGNOSIS — R202 Paresthesia of skin: Secondary | ICD-10-CM

## 2017-08-06 DIAGNOSIS — R519 Headache, unspecified: Secondary | ICD-10-CM

## 2017-08-06 DIAGNOSIS — M25561 Pain in right knee: Secondary | ICD-10-CM | POA: Diagnosis not present

## 2017-08-06 MED ORDER — ONABOTULINUMTOXINA 100 UNITS IJ SOLR
85.0000 [IU] | Freq: Once | INTRAMUSCULAR | Status: AC
Start: 2017-08-06 — End: 2017-08-06
  Administered 2017-08-06: 85 [IU] via INTRAMUSCULAR

## 2017-08-06 NOTE — Telephone Encounter (Signed)
This is to document wifes inappropriate behavior at visit.  Started off complaining about pts other physicians (ortho) and then said that patient has not been doing well and it has been difficult to get a hold of me.  Said that I didn't know about it.   Wife said that I didn't know about it because she never left a message for me.  I explained that it would be difficult for me to know about something if she didn't leave a message for me.  She said that there would be no use in leaving a message as she would not hear from me.  I have never met his wife (or don't remember if I have) and told her that I and my staff are very good at addressing messages.  She then said that she is very glad that he didn't have an emergency since I am booked out so far.  Pts wife continued to go on tangents.  I explained that patient was here for a botox procedural visit and that he really needed to make a follow up appointment to discuss further issues.  His wife got angry and said "we will just deal with this with his cardiologist."  I told his wife that I didn't understand her anger and that I was doing my best to take care of the patient.  In the end, we ended up doing both a regular visit and procedural visit.  Pt was very pleasant but wife remained angry and stormed out of office at end of visit.

## 2017-08-06 NOTE — Telephone Encounter (Signed)
New message    Patient spouse calling with concerns; episodes of weakness, dizziness and feeling faint at times. No chest pain, No SOB. Declined to schedule with APP staff.

## 2017-08-06 NOTE — Procedures (Signed)
[facility]   History:  Diagnosis: Migraine, chronic  Initial side: bilateral     Consent obtained from: The patient Benefits discussed included, but were not limited to decreased muscle tightness, increased joint range of motion, and decreased pain.  Risk discussed included, but were not limited pain and discomfort, bleeding, bruising, excessive weakness, venous thrombosis, muscle atrophy and dysphagia.  A copy of the patient medication guide was given to the patient which explains the blackbox warning.  Patients identity and treatment sites confirmed Yes.  .  Details of Procedure: Skin was cleaned with alcohol.  A 30 gauge, 1/2 inch needle was used.  Prior to injection, the needle plunger was aspirated to make sure the needle was not within a blood vessel.  There was no blood retrieved on aspiration.    Following is a summary of the muscles injected  And the amount of Botulinum toxin used:  Injections  Location Left  Right Units Number of sites        Corrugator 2.5/2.5 2.5/2.5 10 2  each  Frontalis 2.5/2.5 2.5/2.5 10 2  each  Temporalis 5.0/5.0 5.0/5.0 20.0 2 each  Cervical paraspinal, posterior 5.0/5.0 5.0/5.0 20 2 each  occiput 5.0/5.0 5.0/5.0 20.0 2 each  Procerus   5.0 1  TOTAL UNITS:   85     Agent: Botulinum Type A ( Onobotulinum Toxin type A ).  1 vials of Botox were used, each containing 50 units and freshly diluted with 2 mL of sterile, non-perserved saline   Total injected (Units): 85  Total wasted (Units): 10 Pt tolerated procedure well without complications.   Reinjection is anticipated in 3 months.

## 2017-08-06 NOTE — Telephone Encounter (Signed)
Returned call to patient's wife. He has episodes of weakness, dizziness, feeling faint. He has episodes sporadically. He had 2 episodes on Sunday. He is not a diabetic. There is no known cause that wife can figure out. BP has been as low as 70/45 when sick with MRSA. She states his BP tends to run low - 90s/60s. Advised it would be best for patient to be evaluated in office. Scheduled for July 2 with Ermalinda Memos, PA

## 2017-08-06 NOTE — Progress Notes (Signed)
Subjective:    [name] was seen in consultation in the movement disorder clinic at the request of Dr. Joya Salm.  His PCP is Bedsole, Amy E, MD.  This patient is accompanied in the office by his child who supplements the history. The evaluation is for tremor.  I reviewed prior records that are available to me.  Looking back in his medical record, there are notes back to 2012 that indicate severe essential tremor.  Pt reports tremor has been worse since recent surgeries.  He had a shoulder surgery for bone spurs first and then had a shoulder replacement on the L and then had a cervical fusion surgery.  Prior to that, he had gastric bypass in 2013.    The patient is a 77 y.o. right handed male with a history of tremor.  Pt reports that tremor started after an episode of sepsis about 5 years ago.  Once he left the hospital, it was in the bilateral UE, overall slight but it has gotten worse over 8 months.  He cannot eat peas or the roll off the spoon.   There is no family hx of tremor.    Affected by caffeine:  No. Affected by alcohol: doesn't drink Affected by stress:  No. Affected by fatigue:  No. Spills soup if on spoon:  Yes.   Spills glass of liquid if full:  No. Affects ADL's (tying shoes, brushing teeth, etc):  No.  Pt had neck hematomas after surgery, which caused dysphagia.  A modified barium swallow was done on 08/14/2013 indicate severe cervical face dysphagia, moderate to severe pharyngeal phase dysphagia.  There is recommended if crush his medications or takes them in liquid form.  Outpatient speech language pathology and nutrition consult was also recommended.  11/01/13 update:  Pt is returning for f/u.  Started primidone last visit.   Pt states that it has been helpful but thinks that we could go up on the medication.  No SE.  Had carpal tunnel release on the L.  States that it didn't help.  Has also gone to speech therapy for swallowing, as recommended and that did not  help.  01/02/14 update:  The patient returns for follow-up today.  He has a history of essential tremor.  Last visit, his primidone was increased 50 mg twice a day.  "I think that if you increased it one more time, we will have it just right."  I got a note from his ENT physician at [facility].  They agreed that dysphagia was related to prior cervical spine surgery.  They told him that loss of balance is not due to an inner ear pathology and he was told to follow-up here.  He does have a history of diabetic peripheral neuropathy.  Pt states that balance has been off since the neck surgery and thinks that it is related.  He states that it actually has gotten slowly better.  Balance isn't great but he isn't falling anymore either which used to be a big problem.  05/02/14 update:  His primidone was increased last visit so that he is taking 100 mg in the morning and he remains on 50 mg at night.  The patient states that tremor has tremor is doing well.  He states that tremor is well controlled.  He only spills something or notices tremor 1-2 days a month, which is markedly improved.  No SE with the tremor.    11/02/14 update:  The patient has a history of essential  tremor and is on primidone, 100 mg in the morning and 50 mg at night. He thinks that tremor is well controlled.   I reviewed records since our last visit.  He has undergone a rather extensive workup for epigastric pain and weight loss.  He is a CT of the abdomen that was negative.  His H. pylori antibodies were negative.  He had a stress Myoview and while there was some evidence of ischemia, the patient did not believe that this was cardiac related and did not want to proceed with a cath.  He has seen gastroenterology and has had an EGD.  He was started on carafate and he thinks that it is helping.  He thinks that much of it is from the gastric bypass as he has emesis after the meals.    11/27/14 update:  Pt is f/u much earlier than expected.  On  primidone, 100 mg in AM, 50 mg at night.  The records that were made available to me were reviewed since last visit.  Had to have penile prosthesis removed that malfunctioned and insert a new one. Also, had surgery for his tear ducts yesterday. His wife states that Sunday AM he was getting out of the shower and he screamed and he felt like he was getting stabbed in the back of the head.  He's had this in the past but not this severe.  It is sore to the touch behind the ear.  It was awful for 15 min (felt like on fire) but if he turns his head to the right, he still feels it.    03/22/15 update:  The patient follows up today regarding his essential tremor.  He is on primidone, 100 mg in the morning and 50 g at night.  Tremor has slightly increased with time.   Last visit, which was in October, we did an occipital nerve block for right occipital neuralgia.  This was done on 11/28/2014.  He states that it did not help and he has been having a daily headache.  He continues to have a posterior occipital headache that radiates to the frontal region but he points to the left side today.  He states that the fingers on the L thumb, pointer and middle finger are numb.  States had Lcarpal tunnel surgery a year ago but these sx's started after shoulder replacement surgery and is convinced that something happened; no neck pain but has had prior neck surgery.  The shoulder replacement surgery was done first and the CTS was done a month later.     I did review records since our last visit.  He underwent surgery for an internal hernia repair on 03/08/2015.  09/30/15 update:  The patient follows up today.  He has a history of essential tremor and last visit I slightly increased his primidone to that he is taking 100 mg both in the morning and at night.  He states that he still has tremor but that is fairly stable and not worse.  He was complaining about left-sided hand paresthesias last visit and we ended up doing an EMG on file  14th, 2017.  This was normal, without evidence of focal neuropathy or radiculopathy.  C/o pain in back of the head that will feel like he is getting hit in the head on either and then will go away within a few seconds.  Has had a few times in the last few months but always keeps a "low grade headache."   No  hx of stomach ulcer.  No diabetic meds x many months.  He is unsure why he takes the gabapentin but thinks it may be for diabetic neuropathy and gouty pain.    05/19/16 update:  Patient seen today in follow-up.  He is on primidone, 100 mg twice a day.   He had CTS by Dr. Amedeo Plenty on the L 4 weeks ago and on the R 6 weeks ago.  In regards to headache, the patient reports that they have been about the same.  He has 30 headache days per month.  They are frontal and occipital.  They can be moderate to severe but they are not that way all the time.  Remains on gabapentin, 300 mg twice per day.  Asks for refill of that.    10/16/16 update:  Pt seen today.  Was here with daughter who supplements the history.  Is here for botox for migraine but brings up other issues as well.  Daughter reveals patient taking tylenol, many daily, and states been going on since 1970's!  Also she states pt very off balance.  Recently fell off ladder.  Did have syncopal episode in recent past but because BS changes.  His BS are very out of control and has appt with endocrinology.  12/22/16 update:  Patient seen today in follow-up.  This patient is accompanied in the office by his daughter who supplements the history.Patient had Botox on 10/16/2016.  We talked about discontinuing Tylenol at that visit as well.  The patient states that he cut down on his tylenol to total of 10 tablets per day.  He has headaches 5-6 days per week.   Headaches are bilateral occiput.  States that he saw Dr. Kathyrn Sheriff yesterday and was told that he recommended pain management for injection.  States that he had MRI Cervical spine 2 weeks ago.  I reviewed this.  It  demonstrated prior cervical fusion and mild degenerative changes.  Stress is also playing a role (job stress).   I thought that I worked him in today because of increasing tremor.   Pt states that it is only "a little bothersome" but daughter states that his wife notes that he has some trouble with eating peas.   On primidone, 100 mg twice daily.  He is still on Coumadin.  Cannot take Topamax due to nephrolithiasis history.  Is already on gabapentin for headache and neuropathy.  08/06/17 update: Patient was originally here just for Botox injections, but many other issues came up during the visit and ultimately a follow-up visit was completed.  Patient reports that it has been so long since he has been seen because he had knee replacement and subsequently had a MRSA infection.  Wife reports that he was told that the MRSA was not from the surgery.  Wife was very angry.  Patient has been having some spells where he feels what he describes as a "heat" on his head that feels like it spreads throughout his chest and then he gets an overwhelming feeling of weakness.  He does not really have palpitations.  It can last for 15 or more minutes.  He can have this up to 3 times per week.  He describes this as different than his his headaches.  His records indicate that there has been concern about excessive alcohol use. He denied this to PCP.  AST and ALT were elevated.  Current/Previously tried tremor medications: no  Current medications that may exacerbate tremor:  n/a  Outside reports  reviewed: historical medical records, lab reports and referral letter/letters.  Allergies  Allergen Reactions  . Sulfacetamide Sodium Swelling    throat swelling  . Latex Itching  . Adhesive [Tape] Other (See Comments)    Tears skin-- "No tape of any kind, they all tear skin right off" Tears skin-- "No tape of any kind, they all tear skin right off"    Current Outpatient Medications on File Prior to Visit  Medication Sig  Dispense Refill  . acarbose (PRECOSE) 50 MG tablet Take 50 mg by mouth 3 (three) times daily.  4  . acetaminophen-codeine (TYLENOL #3) 300-30 MG tablet     . atorvastatin (LIPITOR) 80 MG tablet Take 1 tablet (80 mg total) by mouth at bedtime. 90 tablet 3  . bisacodyl (DULCOLAX) 5 MG EC tablet Take 1 tablet (5 mg total) by mouth daily as needed for moderate constipation. 15 tablet 0  . docusate sodium (COLACE) 100 MG capsule Take 1 capsule (100 mg total) by mouth 2 (two) times daily. 30 capsule 0  . erythromycin ophthalmic ointment APPLY TO RIGHT EYE 4 TIMES A DAY  2  . FLUoxetine HCl 60 MG TABS 1 tab po at bedtime 30 tablet 3  . furosemide (LASIX) 20 MG tablet Take 1-2 tablets (20-40 mg total) by mouth daily. (Patient taking differently: Take 40 mg by mouth daily. ) 180 tablet 1  . gabapentin (NEURONTIN) 300 MG capsule Take 1 capsule (300 mg total) by mouth 2 (two) times daily. 180 capsule 0  . HYDROcodone-acetaminophen (NORCO/VICODIN) 5-325 MG tablet Take 1-2 tablets by mouth every 6 (six) hours as needed. 6 tablet 0  . hydrocortisone 2.5 % cream APPLICATIONS APPLY ON THE SKIN APPLY ON THE SKIN TWICE A DAY AS NEEDED  0  . L-Methylfolate-Algae-B12-B6 (METANX) 3-90.314-2-35 MG CAPS Take 1 capsule by mouth 2 (two) times daily.    . lansoprazole (PREVACID) 30 MG capsule TAKE 1 CAPSULE (30 MG TOTAL) BY MOUTH ONCE DAILY.  3  . lidocaine (XYLOCAINE) 5 % ointment APPLY 1 GRAM ( 1 GRAM = 1 INCH) TO THE AFFECTED AREA 2 TIMES A DAY  12  . LORazepam (ATIVAN) 1 MG tablet Take 0.5 tablets (0.5 mg total) by mouth 2 (two) times daily as needed for anxiety. 15 tablet 0  . nystatin cream (MYCOSTATIN) APPLY TO SKIN TWICE DAILY AS DIRECTED AS NEEDED  2  . omeprazole (PRILOSEC) 40 MG capsule Take 40 mg by mouth daily.    [name] oxyCODONE (OXY IR/ROXICODONE) 5 MG immediate release tablet TAKE 1 TABLET BY MOUTH EVERY 8-12 HOURS AS NEEDED FOR PAIN  0  . primidone (MYSOLINE) 50 MG tablet 150 mg in the morning, 100 mg at night  450 tablet 1  . ranitidine (ZANTAC) 300 MG tablet Take 300 mg by mouth at bedtime.     . sucralfate (CARAFATE) 1 g tablet Take 1 g by mouth daily before breakfast.    . tamsulosin (FLOMAX) 0.4 MG CAPS capsule Take 0.4 mg by mouth daily.     [name] tiZANidine (ZANAFLEX) 4 MG tablet Take 1 tablet (4 mg total) by mouth every 6 (six) hours as needed. 40 tablet 1  . Transparent Dressings (NEXCARE TEGADERM 2-3/8"X2-3/4") MISC APPLY TO AREA OF PRESSURE ULCER EVERY OTHER DAY  0  . warfarin (COUMADIN) 2.5 MG tablet TAKE 1 TO 1.5 TABLETS BY  MOUTH DAILY OR AS DIRECTED  BY COUMADIN CLINIC (Patient taking differently: Take 2.5-3.75 mg by mouth See admin instructions. 2.5 Sunday and Thursday, all other  days is 3.75 mg) 120 tablet 0  . XIIDRA 5 % SOLN INSTILL 1 DROPS INTO BOTH EYES TWICE A DAY  99  . zolpidem (AMBIEN CR) 12.5 MG CR tablet Take 1 tablet (12.5 mg total) by mouth at bedtime as needed for sleep. 30 tablet 0   No current facility-administered medications on file prior to visit.     Past Medical History:  Diagnosis Date  . Arthritis    hands   . Asthma   . Cancer ([place])    SKIN CANCER REMOVED  . Chronic atrial fibrillation (HCC)    a.fib on warfarin- Dr. Percival Spanish follows  . Diverticulosis of colon (without mention of hemorrhage)   . Dysrhythmia    a fib  . ED (erectile dysfunction)   . Fibromyalgia   . GERD (gastroesophageal reflux disease)   . Hard of hearing    wears hearing aids  . Headache   . History of blood clots    behind right knee and then went into left lung 102yrs ago  . History of colon polyps   . History of kidney stones   . Hx of diabetes mellitus    "no longer diabetic since gastric bypass" - no longer taking metformin"  in 2 months "problems with low blood sugar now"  . Hx of gout    but doesn't take any meds  . Inflammation of colonic mucosa    recent admission and release from [facility] 02-28-15-remains on oral antibiotic  . Obesity   . Peripheral vascular disease ([place])    . Pneumonia    last time about 65yrs ago  . Pulmonary embolism ([place])    over 10 yrs ago "many years ago"  . Restless legs syndrome (RLS)   . Unspecified asthma(493.90)    as a child  . Unspecified essential hypertension    hx of no longer on medication due to gastric bypass     Past Surgical History:  Procedure Laterality Date  . ANTERIOR CERVICAL DECOMP/DISCECTOMY FUSION N/A 05/09/2013   Procedure: ANTERIOR CERVICAL DECOMPRESSION/DISCECTOMY FUSION CERVICAL THREE-FOUR,CERVICAL FOUR-FIVE,CERVICAL FIVE-SIX;  Surgeon: Floyce Stakes, MD;  Location: [facility];  Service: Neurosurgery;  Laterality: N/A;  . ARTERY REPAIR     Left forearm  . BACK SURGERY     x 3  . BREATH TEK H PYLORI  01/08/2011   Procedure: BREATH TEK H PYLORI;  Surgeon: Pedro Earls, MD;  Location: [facility];  Service: General;  Laterality: N/A;  . CARDIOVERSION     x 2 attempts-unsuccessful.  [name] CATARACT EXTRACTION, BILATERAL    . COLONOSCOPY    . CYSTOSCOPY    . ESOPHAGOGASTRODUODENOSCOPY (EGD) WITH PROPOFOL N/A 09/21/2014   Procedure: ESOPHAGOGASTRODUODENOSCOPY (EGD) WITH PROPOFOL;  Surgeon: Manya Silvas, MD;  Location: [facility]COPY;  Service: Endoscopy;  Laterality: N/A;  . EYE SURGERY     cataract bil  . FOOT SURGERY     Left foot   . GASTRIC ROUX-EN-Y  08/11/2011   Procedure: LAPAROSCOPIC ROUX-EN-Y GASTRIC BYPASS WITH UPPER ENDOSCOPY;  Surgeon: Pedro Earls, MD;  Location: [facility];  Service: General;  Laterality: N/A;  . HAND SURGERY     LEFT  . injections in back     x 18  . JOINT REPLACEMENT    . KNEE SURGERY Left    x 4  . KNEE SURGERY Left    arthroscopy  . LAPAROSCOPIC INTERNAL HERNIA REPAIR N/A 03/08/2015   Procedure: LAPAROSCOPIC INTERNAL HERNIA REPAIR ;  Surgeon: Rodman Key  Hassell Done, MD;  Location: [facility];  Service: General;  Laterality: N/A;  . LEG SURGERY     FOR NECROTIZING FASCITIS L LEG AND GROIN  . PANNICULECTOMY N/A 08/25/2016   Procedure: PANNICULECTOMY;  Surgeon: [name], MD;  Location: [facility];  Service: General;  Laterality: N/A;  . PENILE PROSTHESIS IMPLANT N/A 07/02/2014   Procedure: PENILE PROTHESIS INFLATABLE 3 PIECE (COLOPLAST) SCROTAL APPROACH;  Surgeon: Kathie Rhodes, MD;  Location: [facility];  Service: Urology;  Laterality: N/A;  . PENILE PROSTHESIS IMPLANT N/A 11/23/2014   Procedure: EXPLORATION AND REVISION OF PENILE PROSTHESIS;  Surgeon: Kathie Rhodes, MD;  Location: [facility];  Service: Urology;  Laterality: N/A;  . PICC INSERTION W/OUT PORT/PUMP    . REPLACEMENT TOTAL KNEE Left    x 2  . SHOULDER ARTHROSCOPY    . SHOULDER SURGERY Left 2014  . TONSILLECTOMY    . TOTAL KNEE ARTHROPLASTY Right 04/27/2017  . TOTAL KNEE ARTHROPLASTY Right 04/27/2017   Procedure: TOTAL KNEE ARTHROPLASTY;  Surgeon: Melrose Nakayama, MD;  Location: [facility];  Service: Orthopedics;  Laterality: Right;  . TOTAL SHOULDER ARTHROPLASTY Left 01/26/2013   Procedure: TOTAL SHOULDER ARTHROPLASTY;  Surgeon: Nita Sells, MD;  Location: [place];  Service: Orthopedics;  Laterality: Left;  Left total shoulder arthroplasty    Social History   Socioeconomic History  . Marital status: Married    Spouse name: Not on file  . Number of children: 1  . Years of education: Not on file  . Highest education level: Not on file  Occupational History  . Occupation: Retired    Comment: Investment banker, operational  . Financial resource strain: Not on file  . Food insecurity:    Worry: Not on file    Inability: Not on file  . Transportation needs:    Medical: Not on file    Non-medical: Not on file  Tobacco Use  . Smoking status: Never Smoker  . Smokeless tobacco: Never Used  Substance and Sexual Activity  . Alcohol use: No    Alcohol/week: 0.0 oz  . Drug use: No  . Sexual activity: Yes  Lifestyle  . Physical activity:    Days per week: Not on file    Minutes per session: Not on file  . Stress: Not on file  Relationships  . Social connections:    Talks on phone: Not on  file    Gets together: Not on file    Attends religious service: Not on file    Active member of club or organization: Not on file    Attends meetings of clubs or organizations: Not on file    Relationship status: Not on file  . Intimate partner violence:    Fear of current or ex partner: Not on file    Emotionally abused: Not on file    Physically abused: Not on file    Forced sexual activity: Not on file  Other Topics Concern  . Not on file  Social History Narrative   DIET: 3 meals, F&V, some water, crystal light, No fast food   Exercise: walks treadmill 30 min      1 Caffeine drinks daily       No living will.           Family Status  Relation Name Status  . Mother  Deceased       breast/uterine cancer  . Father  Deceased       emphysema, heart disease  . Sister  Deceased       alzheimer's disease  . Brother  Deceased       struck by lightening  . Brother  Deceased       colon cancer  . Brother  Deceased       heart disease  . Brother  Alive       prostate cancer  . Daughter  Alive       healthy  . Sister  (Not Specified)  . Brother  (Not Specified)  . Brother  (Not Specified)  . Brother  (Not Specified)    Review of Systems   No CP.  No palpitations.  No sob.  Intermittent generalized weakness.   Objective:   VITALS:   There were no vitals filed for this visit. Wt Readings from Last 3 Encounters:  08/03/17 190 lb (86.2 kg)  07/02/17 187 lb 12 oz (85.2 kg)  06/22/17 187 lb 12 oz (85.2 kg)    GEN:  The patient appears stated age and is in NAD.   Neurological examination:  Orientation: The patient is alert and oriented x3. Cranial nerves: There is good facial symmetry. The speech is fluent and clear. Soft palate rises symmetrically and there is no tongue deviation. Hearing is intact to conversational tone. Sensation: Sensation is intact to light touch throughout Motor: Strength is at least antigravity x 4.  Movement examination: Tone: There is  normal tone in the UE/LE Gait and Station: The patient has no difficulty arising out of a deep-seated chair without the use of the hands. The patient's stride length is good.    MOVEMENT EXAM: Tremor:  There is no tremor noted today.  LABS    Chemistry      Component Value Date/Time   NA 137 06/17/2017 0811   K 4.7 06/17/2017 0811   CL 105 06/17/2017 0811   CO2 26 06/17/2017 0811   BUN 15 06/17/2017 0811   CREATININE 1.38 06/17/2017 0811      Component Value Date/Time   CALCIUM 8.7 06/17/2017 0811   ALKPHOS 73 06/17/2017 0811   AST 18 06/17/2017 0811   ALT 18 06/17/2017 0811   BILITOT 0.4 06/17/2017 0811          Assessment/Plan:   1.  Essential Tremor.  -limited options.   I do not think that adding a beta-blocker would be a great idea as he has struggled with low blood pressure.  He cannot take Topamax because of his history of nephrolithiasis.  He is already on gabapentin for headache and neuropathy.  Not a DBS candidate given other medical problems.  Continue primidone, 150 mg in the morning and 100 mg at night.  2.  Gait instability  -It is believed that ET alone can cause ataxia/balance changes due to changes in the cerebellar purkinje cell/climbing fiber synaptic transmission.   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  This is getting worse and patient reports blood sugars have been out of control lately and has an upcoming appt with endocrinology.  Asked him to make a f/u appt here as well and will discuss in more detail.  Safety discussed 3.  Dyphagia  -due to prior complications from neck surgery 4.  Episodes of weakness and paresthesias.  -I do not think that these are primary neurologic in nature.  We will go ahead and do an EEG.  If negative, and ambulatory  EEG can be done.  I did tell them to follow through with the cardiologist.  His wife was angry and unpleasant today.  They do need  to make a regular follow-up visit at our clinic, as opposed to trying to be seen at a procedural clinic (today was supposed to be a Botox visit only).  -As above, there has been some concern in the chart about possible excess alcohol use.  This is denied by patient.  Liver enzymes are elevated. 5.  Headache  -no topamax due to hx of nephrolithiasis.   -gabapentin not providing enough relief.  Still having headache.    -Has stopped his Tylenol, so overuse headache is not likely a big thing.  -I do not think cervicogenic headache is a huge component.  -Botox is repeated today.  Part of the issue, however, is that Botox has been done inconsistently.  Last Botox was given 6 months ago.  We repeated it today.  If this does not help, we will consider referring him to headache center.

## 2017-08-09 DIAGNOSIS — M25561 Pain in right knee: Secondary | ICD-10-CM | POA: Diagnosis not present

## 2017-08-09 DIAGNOSIS — R262 Difficulty in walking, not elsewhere classified: Secondary | ICD-10-CM | POA: Diagnosis not present

## 2017-08-09 DIAGNOSIS — M25661 Stiffness of right knee, not elsewhere classified: Secondary | ICD-10-CM | POA: Diagnosis not present

## 2017-08-09 NOTE — Progress Notes (Signed)
Cardiology Office Note:    Date:  08/10/2017   ID:  [name], DOB [birth year], MRN [id number]  PCP:  Jinny Sanders, MD  Cardiologist:  Minus Breeding, MD   Referring MD: Jinny Sanders, MD   Chief Complaint  Patient presents with  . Fatigue    History of Present Illness:    [name] is a 77 y.o. male with a hx of atrial fibrillation on coumadin, HTN, hx of PE (10 years ago), DM, GERD, COPD, and fibromyalgia. Per notes, possible excessive alcohol use. He had a myoview 09/28/14 that was of poor quality that showed possible partially reversible defect.  He last saw Dr. Percival Spanish in clinic on 11/30/16. At that time he was doing well. No medication changes, but it was noted that his blood pressure tends to run low.    Since his last visit with Dr. Percival Spanish, he has been on ABX following right TKA. He was seen in the ED on 06/03/17 (sent by ortho) for evaluation of pain and swelling in his knee. He has seen ortho during that visit and they did not think he has a septic joint. He completed vanc treatment for his knee and is now in PT.  Per recent phone notes 08/06/17, the pt's wife called describing episodes of weakness, dizziness, and feeling faint.  Pt presents for follow up for these symptoms. He describes episodes starting like a ball of heat starting at the top of his head and spreading downward throughout his body. He denies chest pain, shortness of breath, dizziness, lightheadedness, and syncope. His wife describes one episode of pre-syncope on 08/01/17, but he did not lose consciousness. These episodes happen about once per week. He does not syncopize. The episodes generally occur when he gets up from a seated position and can last 10-20 minutes. He denies palpitations. This has been occurring for the past 6 months. He thinks he has a problem with his carotids. Per his wife, his blood glucose is not too low during these episodes, but she does not present a BG log. They are also not  recording daily pressures. He takes 20 mg lasix every day for swelling. He also takes flomax.  Orthostatic vitals: Lying: 117/80, HR 78 Sitting: 98/60, HR 77 Standing: 109/60, HR 85 6min standing: 97/663, HR 52   Past Medical History:  Diagnosis Date  . Arthritis    hands   . Asthma   . Cancer ([place])    SKIN CANCER REMOVED  . Chronic atrial fibrillation (HCC)    a.fib on warfarin- Dr. Percival Spanish follows  . Diverticulosis of colon (without mention of hemorrhage)   . Dysrhythmia    a fib  . ED (erectile dysfunction)   . Fibromyalgia   . GERD (gastroesophageal reflux disease)   . Hard of hearing    wears hearing aids  . Headache   . History of blood clots    behind right knee and then went into left lung 55yrs ago  . History of colon polyps   . History of kidney stones   . Hx of diabetes mellitus    "no longer diabetic since gastric bypass" - no longer taking metformin"  in 2 months "problems with low blood sugar now"  . Hx of gout    but doesn't take any meds  . Inflammation of colonic mucosa    recent admission and release from [facility] 02-28-15-remains on oral antibiotic  . Obesity   . Peripheral vascular disease ([place])   .  Pneumonia    last time about 47yrs ago  . Pulmonary embolism ([place])    over 10 yrs ago "many years ago"  . Restless legs syndrome (RLS)   . Unspecified asthma(493.90)    as a child  . Unspecified essential hypertension    hx of no longer on medication due to gastric bypass     Past Surgical History:  Procedure Laterality Date  . ANTERIOR CERVICAL DECOMP/DISCECTOMY FUSION N/A 05/09/2013   Procedure: ANTERIOR CERVICAL DECOMPRESSION/DISCECTOMY FUSION CERVICAL THREE-FOUR,CERVICAL FOUR-FIVE,CERVICAL FIVE-SIX;  Surgeon: Floyce Stakes, MD;  Location: [facility];  Service: Neurosurgery;  Laterality: N/A;  . ARTERY REPAIR     Left forearm  . BACK SURGERY     x 3  . BREATH TEK H PYLORI  01/08/2011   Procedure: BREATH TEK H PYLORI;  Surgeon: Pedro Earls, MD;  Location: [facility];  Service: General;  Laterality: N/A;  . CARDIOVERSION     x 2 attempts-unsuccessful.  [name] CATARACT EXTRACTION, BILATERAL    . COLONOSCOPY    . CYSTOSCOPY    . ESOPHAGOGASTRODUODENOSCOPY (EGD) WITH PROPOFOL N/A 09/21/2014   Procedure: ESOPHAGOGASTRODUODENOSCOPY (EGD) WITH PROPOFOL;  Surgeon: Manya Silvas, MD;  Location: [facility] ENDOSCOPY;  Service: Endoscopy;  Laterality: N/A;  . EYE SURGERY     cataract bil  . FOOT SURGERY     Left foot   . GASTRIC ROUX-EN-Y  08/11/2011   Procedure: LAPAROSCOPIC ROUX-EN-Y GASTRIC BYPASS WITH UPPER ENDOSCOPY;  Surgeon: Pedro Earls, MD;  Location: [facility];  Service: General;  Laterality: N/A;  . HAND SURGERY     LEFT  . injections in back     x 18  . JOINT REPLACEMENT    . KNEE SURGERY Left    x 4  . KNEE SURGERY Left    arthroscopy  . LAPAROSCOPIC INTERNAL HERNIA REPAIR N/A 03/08/2015   Procedure: LAPAROSCOPIC INTERNAL HERNIA REPAIR ;  Surgeon: [name], MD;  Location: [facility];  Service: General;  Laterality: N/A;  . LEG SURGERY     FOR NECROTIZING FASCITIS L LEG AND GROIN  . PANNICULECTOMY N/A 08/25/2016   Procedure: PANNICULECTOMY;  Surgeon: [name], MD;  Location: [facility];  Service: General;  Laterality: N/A;  . PENILE PROSTHESIS IMPLANT N/A 07/02/2014   Procedure: PENILE PROTHESIS INFLATABLE 3 PIECE (COLOPLAST) SCROTAL APPROACH;  Surgeon: Kathie Rhodes, MD;  Location: [facility];  Service: Urology;  Laterality: N/A;  . PENILE PROSTHESIS IMPLANT N/A 11/23/2014   Procedure: EXPLORATION AND REVISION OF PENILE PROSTHESIS;  Surgeon: Kathie Rhodes, MD;  Location: [facility];  Service: Urology;  Laterality: N/A;  . PICC INSERTION W/OUT PORT/PUMP    . REPLACEMENT TOTAL KNEE Left    x 2  . SHOULDER ARTHROSCOPY    . SHOULDER SURGERY Left 2014  . TONSILLECTOMY    . TOTAL KNEE ARTHROPLASTY Right 04/27/2017  . TOTAL KNEE ARTHROPLASTY Right 04/27/2017   Procedure: TOTAL KNEE ARTHROPLASTY;  Surgeon: Melrose Nakayama, MD;   Location: [place];  Service: Orthopedics;  Laterality: Right;  . TOTAL SHOULDER ARTHROPLASTY Left 01/26/2013   Procedure: TOTAL SHOULDER ARTHROPLASTY;  Surgeon: Nita Sells, MD;  Location: [place];  Service: Orthopedics;  Laterality: Left;  Left total shoulder arthroplasty    Current Medications: Current Meds  Medication Sig  . acarbose (PRECOSE) 50 MG tablet Take 50 mg by mouth 3 (three) times daily.  [name] acetaminophen-codeine (TYLENOL #3) 300-30 MG tablet   . atorvastatin (LIPITOR) 80 MG tablet Take 1 tablet (80  mg total) by mouth at bedtime.  . bisacodyl (DULCOLAX) 5 MG EC tablet Take 1 tablet (5 mg total) by mouth daily as needed for moderate constipation.  . docusate sodium (COLACE) 100 MG capsule Take 1 capsule (100 mg total) by mouth 2 (two) times daily.  [name] erythromycin ophthalmic ointment APPLY TO RIGHT EYE 4 TIMES A DAY  . FLUoxetine HCl 60 MG TABS 1 tab po at bedtime  . furosemide (LASIX) 20 MG tablet Take 1 tablet (20 mg total) by mouth every other day.  . gabapentin (NEURONTIN) 300 MG capsule Take 1 capsule (300 mg total) by mouth 2 (two) times daily.  [name] HYDROcodone-acetaminophen (NORCO/VICODIN) 5-325 MG tablet Take 1-2 tablets by mouth every 6 (six) hours as needed.  . hydrocortisone 2.5 % cream APPLICATIONS APPLY ON THE SKIN APPLY ON THE SKIN TWICE A DAY AS NEEDED  . L-Methylfolate-Algae-B12-B6 (METANX) 3-90.314-2-35 MG CAPS Take 1 capsule by mouth 2 (two) times daily.  . lansoprazole (PREVACID) 30 MG capsule TAKE 1 CAPSULE (30 MG TOTAL) BY MOUTH ONCE DAILY.  [name] lidocaine (XYLOCAINE) 5 % ointment APPLY 1 GRAM ( 1 GRAM = 1 INCH) TO THE AFFECTED AREA 2 TIMES A DAY  . LORazepam (ATIVAN) 1 MG tablet Take 0.5 tablets (0.5 mg total) by mouth 2 (two) times daily as needed for anxiety.  [name] nystatin cream (MYCOSTATIN) APPLY TO SKIN TWICE DAILY AS DIRECTED AS NEEDED  . omeprazole (PRILOSEC) 40 MG capsule Take 40 mg by mouth daily.  [name] oxyCODONE (OXY IR/ROXICODONE) 5 MG immediate  release tablet TAKE 1 TABLET BY MOUTH EVERY 8-12 HOURS AS NEEDED FOR PAIN  . primidone (MYSOLINE) 50 MG tablet 150 mg in the morning, 100 mg at night  . ranitidine (ZANTAC) 300 MG tablet Take 300 mg by mouth at bedtime.   . sucralfate (CARAFATE) 1 g tablet Take 1 g by mouth daily before breakfast.  . tamsulosin (FLOMAX) 0.4 MG CAPS capsule Take 0.4 mg by mouth daily.   [name] tiZANidine (ZANAFLEX) 4 MG tablet Take 1 tablet (4 mg total) by mouth every 6 (six) hours as needed.  . Transparent Dressings (NEXCARE TEGADERM 2-3/8"X2-3/4") MISC APPLY TO AREA OF PRESSURE ULCER EVERY OTHER DAY  . warfarin (COUMADIN) 2.5 MG tablet TAKE 1 TO 1.5 TABLETS BY  MOUTH DAILY OR AS DIRECTED  BY COUMADIN CLINIC (Patient taking differently: Take 2.5-3.75 mg by mouth See admin instructions. 2.5 Sunday and Thursday, all other days is 3.75 mg)  . XIIDRA 5 % SOLN INSTILL 1 DROPS INTO BOTH EYES TWICE A DAY  . zolpidem (AMBIEN CR) 12.5 MG CR tablet Take 1 tablet (12.5 mg total) by mouth at bedtime as needed for sleep.  . [DISCONTINUED] furosemide (LASIX) 20 MG tablet Take 1-2 tablets (20-40 mg total) by mouth daily. (Patient taking differently: Take 40 mg by mouth daily. )     Allergies:   Sulfacetamide sodium; Latex; and Adhesive [tape]   Social History   Socioeconomic History  . Marital status: Married    Spouse name: Not on file  . Number of children: 1  . Years of education: Not on file  . Highest education level: Not on file  Occupational History  . Occupation: Retired    Comment: Investment banker, operational  . Financial resource strain: Not on file  . Food insecurity:    Worry: Not on file    Inability: Not on file  . Transportation needs:    Medical: Not on file    Non-medical: Not  on file  Tobacco Use  . Smoking status: Never Smoker  . Smokeless tobacco: Never Used  Substance and Sexual Activity  . Alcohol use: No    Alcohol/week: 0.0 oz  . Drug use: No  . Sexual activity: Yes  Lifestyle  .  Physical activity:    Days per week: Not on file    Minutes per session: Not on file  . Stress: Not on file  Relationships  . Social connections:    Talks on phone: Not on file    Gets together: Not on file    Attends religious service: Not on file    Active member of club or organization: Not on file    Attends meetings of clubs or organizations: Not on file    Relationship status: Not on file  Other Topics Concern  . Not on file  Social History Narrative   DIET: 3 meals, F&V, some water, crystal light, No fast food   Exercise: walks treadmill 30 min      1 Caffeine drinks daily       No living will.            Family History: The patient's family history includes Alzheimer's disease in his sister; Breast cancer in his mother; Cancer in his mother; Colon cancer (age of onset: 67) in his brother; Emphysema in his father; Heart attack in his brother; Prostate cancer in his brother; Uterine cancer in his mother.  ROS:   Please see the history of present illness.     All other systems reviewed and are negative.  EKGs/Labs/Other Studies Reviewed:    The following studies were reviewed today:  Myoview 09/28/14:  There was no ST segment deviation noted during stress.  Defect 1: There is a medium defect of moderate severity present in the mid anterior and apical anterior location.  The left ventricular ejection fraction is normal (55-65%).   This study is suboptimal due to intense GI activity. There is a partially reversible distal anterior and apical defect suggestive of ischemia. Correlate clinically due to poor study quality overall.   EKG:  EKG is ordered today.  The ekg ordered today demonstrates rate-controlled atrial fibrillation  Recent Labs: 06/17/2017: ALT 18; BUN 15; Creatinine, Ser 1.38; Hemoglobin 11.1; Platelets 235.0; Potassium 4.7; Sodium 137  Recent Lipid Panel    Component Value Date/Time   CHOL 130 09/11/2016 1135   TRIG 77.0 09/11/2016 1135   HDL  54.00 09/11/2016 1135   CHOLHDL 2 09/11/2016 1135   VLDL 15.4 09/11/2016 1135   LDLCALC 61 09/11/2016 1135   LDLDIRECT 147.0 03/07/2012 1142    Physical Exam:    VS:  BP 128/74   Pulse 75   Ht 6' (1.829 m)   Wt 186 lb (84.4 kg)   BMI 25.23 kg/m     Wt Readings from Last 3 Encounters:  08/10/17 186 lb (84.4 kg)  08/03/17 190 lb (86.2 kg)  07/02/17 187 lb 12 oz (85.2 kg)     GEN:  Well nourished, well developed in no acute distress HEENT: Normal NECK: No JVD; No carotid bruits LYMPHATICS: No lymphadenopathy CARDIAC: irregular rhythm, regular rate, no murmur RESPIRATORY:  Clear to auscultation without rales, wheezing or rhonchi  ABDOMEN: Soft, non-tender, non-distended MUSCULOSKELETAL:  No edema; No deformity, right knee with limited mobility SKIN: Warm and dry NEUROLOGIC:  Alert and oriented x 3 PSYCHIATRIC:  Normal affect   ASSESSMENT:    1. Fatigue, unspecified type   2. Atrial fibrillation, permanent (  [place])   3. Essential (primary) hypertension   4. Chronic bronchitis, unspecified chronic bronchitis type ([place])   5. Swelling of lower extremity   6. Pre-syncope    PLAN:    In order of problems listed above:  Fatigue, unspecified type, near syncope Orthostatic vitals negative for hypotension. He was taken off lisinopril during his 05/2017 ER visit. He is not on antihypertensive medications, but does take 20 mg lasix daily and flomax at night. I decreased his lasix to every other day. They will keep a BP and HR log and bring to next visit. Will order a carotid duplex and echocardiogram. I will hold off on myoview for now since he denies anginal symptoms. I am more concerned with dehydration given his episodes occur when getting up form a sitting position.   Atrial fibrillation, permanent ([place]) EKG today with rate controlled Afib. He denies palpitations. We discussed event monitor if current testing is negative. He is not excited about an event monitor. He is compliant on  his coumadin and follows at [employer] [facility] (primary) hypertension He has not been hypertensive. We decreased his lasix to every other day. Continue flomax. Pt will keep BP and HR log and bring to next appt.  Lower extremity swelling As above. Decrease lasix to every other day. We discussed that he may have slight swelling in his right ankle as he heals from his TKA. I also recommended compression stockings. Echocardiogram and clinical signs will guide diuretic regimen.    Chronic bronchitis, unspecified chronic bronchitis type ([place]) Stable.  Follow up in 2-3 weeks after carortid duplex and echo.    Medication Adjustments/Labs and Tests Ordered: Current medicines are reviewed at length with the patient today.  Concerns regarding medicines are outlined above.  Orders Placed This Encounter  Procedures  . EKG 12-Lead  . ECHOCARDIOGRAM COMPLETE   Meds ordered this encounter  Medications  . furosemide (LASIX) 20 MG tablet    Sig: Take 1 tablet (20 mg total) by mouth every other day.    Dispense:  180 tablet    Refill:  1    Signed, Ledora Bottcher, [place]  08/10/2017 8:48 AM    [facility]

## 2017-08-10 ENCOUNTER — Encounter: Payer: Self-pay | Admitting: Physician Assistant

## 2017-08-10 ENCOUNTER — Ambulatory Visit (INDEPENDENT_AMBULATORY_CARE_PROVIDER_SITE_OTHER): Payer: Medicare Other | Admitting: Physician Assistant

## 2017-08-10 VITALS — BP 128/74 | HR 75 | Ht 72.0 in | Wt 186.0 lb

## 2017-08-10 DIAGNOSIS — R5383 Other fatigue: Secondary | ICD-10-CM | POA: Diagnosis not present

## 2017-08-10 DIAGNOSIS — J42 Unspecified chronic bronchitis: Secondary | ICD-10-CM

## 2017-08-10 DIAGNOSIS — I482 Chronic atrial fibrillation: Secondary | ICD-10-CM

## 2017-08-10 DIAGNOSIS — M7989 Other specified soft tissue disorders: Secondary | ICD-10-CM

## 2017-08-10 DIAGNOSIS — I10 Essential (primary) hypertension: Secondary | ICD-10-CM

## 2017-08-10 DIAGNOSIS — R55 Syncope and collapse: Secondary | ICD-10-CM | POA: Diagnosis not present

## 2017-08-10 DIAGNOSIS — I4821 Permanent atrial fibrillation: Secondary | ICD-10-CM

## 2017-08-10 MED ORDER — FUROSEMIDE 20 MG PO TABS: 20.0000 mg | ORAL_TABLET | ORAL | 1 refills | Status: DC

## 2017-08-10 NOTE — Patient Instructions (Addendum)
Medication Instructions:  CHANGE in medication Take Lasix 20mg  every other day   Labwork: None   Testing/Procedures: Your physician has requested that you have an echocardiogram. Echocardiography is a painless test that uses sound waves to create images of your heart. It provides your doctor with information about the size and shape of your heart and how well your heart's chambers and valves are working. This procedure takes approximately one hour. There are no restrictions for this procedure. [facility] has requested that you have a carotid duplex. This test is an ultrasound of the carotid arteries in your neck. It looks at blood flow through these arteries that supply the brain with blood. Allow one hour for this exam. There are no restrictions or special instructions. [facility]  Follow-Up: Your provider recommends that you schedule a follow-up appointment in: 2-3 weeks with Doreene Adas, PA  Any Other Special Instructions Will Be Listed Below (If Applicable). If you need a refill on your cardiac medications before your next appointment, please call your pharmacy.

## 2017-08-11 ENCOUNTER — Ambulatory Visit (INDEPENDENT_AMBULATORY_CARE_PROVIDER_SITE_OTHER): Payer: Medicare Other | Admitting: Neurology

## 2017-08-11 DIAGNOSIS — R519 Headache, unspecified: Secondary | ICD-10-CM

## 2017-08-11 DIAGNOSIS — R51 Headache: Secondary | ICD-10-CM

## 2017-08-13 ENCOUNTER — Telehealth: Payer: Self-pay | Admitting: Neurology

## 2017-08-13 NOTE — Telephone Encounter (Signed)
-----   Message from [place], DO sent at 08/13/2017 10:24 AM EDT ----- Let pt know that EEG is normal.  Can schedule 24 hour ambulatory but not sure that spells he is describing are neurologic

## 2017-08-13 NOTE — Procedures (Signed)
TECHNICAL SUMMARY:  A multichannel referential and bipolar montage EEG using the standard international 10-20 system was performed on the patient described as awake and drowsy.  The dominant background activity consists of 10 hertz activity seen most prominantly over the posterior head region.  Low voltage fast (beta) activity is distributed symmetrically and maximally over the anterior head regions.  ACTIVATION:  Stepwise photic stimulation at 4-20 flashes per second was performed and did not elicit any abnormal waveforms.  Hyperventilation was not performed  EPILEPTIFORM ACTIVITY:  There were no spikes, sharp waves or paroxysmal activity.  SLEEP: Physiologic drowsiness is noted, but no stage II sleep.   IMPRESSION:  This is a normal EEG for the patients stated age.  There were no focal, hemispheric or lateralizing features.  No epileptiform activity was recorded.  A normal EEG does not exclude the diagnosis of a seizure disorder and if seizure remains high on the list of differential diagnosis, an ambulatory EEG may be of value.  Clinical correlation is required.

## 2017-08-13 NOTE — Telephone Encounter (Signed)
Patient's wife made aware. They will call back and let us know how they want to proceed.

## 2017-08-16 DIAGNOSIS — M25561 Pain in right knee: Secondary | ICD-10-CM | POA: Diagnosis not present

## 2017-08-16 DIAGNOSIS — R262 Difficulty in walking, not elsewhere classified: Secondary | ICD-10-CM | POA: Diagnosis not present

## 2017-08-16 DIAGNOSIS — M25661 Stiffness of right knee, not elsewhere classified: Secondary | ICD-10-CM | POA: Diagnosis not present

## 2017-08-18 ENCOUNTER — Other Ambulatory Visit: Payer: Self-pay

## 2017-08-18 ENCOUNTER — Ambulatory Visit (HOSPITAL_COMMUNITY)
Admission: RE | Admit: 2017-08-18 | Discharge: 2017-08-18 | Disposition: A | Discharge status: Home / Self Care | Payer: Medicare Other | Source: Ambulatory Visit | Attending: Cardiology | Admitting: Cardiology

## 2017-08-18 ENCOUNTER — Ambulatory Visit (HOSPITAL_BASED_OUTPATIENT_CLINIC_OR_DEPARTMENT_OTHER): Payer: Medicare Other

## 2017-08-18 DIAGNOSIS — I10 Essential (primary) hypertension: Secondary | ICD-10-CM | POA: Diagnosis not present

## 2017-08-18 DIAGNOSIS — R55 Syncope and collapse: Secondary | ICD-10-CM | POA: Insufficient documentation

## 2017-08-18 DIAGNOSIS — J42 Unspecified chronic bronchitis: Secondary | ICD-10-CM | POA: Insufficient documentation

## 2017-08-18 DIAGNOSIS — M7989 Other specified soft tissue disorders: Secondary | ICD-10-CM

## 2017-08-18 DIAGNOSIS — I482 Chronic atrial fibrillation: Secondary | ICD-10-CM | POA: Insufficient documentation

## 2017-08-18 DIAGNOSIS — I4821 Permanent atrial fibrillation: Secondary | ICD-10-CM

## 2017-08-18 DIAGNOSIS — R5383 Other fatigue: Secondary | ICD-10-CM | POA: Insufficient documentation

## 2017-08-19 ENCOUNTER — Other Ambulatory Visit: Payer: Self-pay | Admitting: Neurology

## 2017-08-20 DIAGNOSIS — R262 Difficulty in walking, not elsewhere classified: Secondary | ICD-10-CM | POA: Diagnosis not present

## 2017-08-20 DIAGNOSIS — M25661 Stiffness of right knee, not elsewhere classified: Secondary | ICD-10-CM | POA: Diagnosis not present

## 2017-08-20 DIAGNOSIS — M25561 Pain in right knee: Secondary | ICD-10-CM | POA: Diagnosis not present

## 2017-08-22 ENCOUNTER — Other Ambulatory Visit: Payer: Self-pay | Admitting: Family Medicine

## 2017-08-24 ENCOUNTER — Telehealth: Payer: Self-pay | Admitting: Physician Assistant

## 2017-08-24 NOTE — Telephone Encounter (Signed)
Returned call to # provided and spoke with wife (DPR). Advised her of results  Notes recorded by Ledora Bottcher, PA on 08/24/2017 at 1:26 PM EDT Your carotid ultrasound shows minimal nonobstructive disease, no treatment needed. Your echo shows a good squeeze function and only mild valvular abnormalities. No obvious cause for your dizziness from these tests. Please continue with BP log and bring to next appt.

## 2017-08-24 NOTE — Telephone Encounter (Signed)
New message ° ° °Please call with echo results °

## 2017-08-25 ENCOUNTER — Other Ambulatory Visit: Payer: Self-pay | Admitting: Family Medicine

## 2017-08-25 NOTE — Telephone Encounter (Signed)
Electronic refill request Last refill 07/26/17 #30 Last office visit 08/03/17 Upcoming appointment 10/08/17

## 2017-08-27 ENCOUNTER — Ambulatory Visit
Admission: RE | Admit: 2017-08-27 | Discharge: 2017-08-27 | Disposition: A | Discharge status: Home / Self Care | Payer: Medicare Other | Source: Ambulatory Visit | Attending: Unknown Physician Specialty | Admitting: Unknown Physician Specialty

## 2017-08-27 ENCOUNTER — Telehealth: Payer: Self-pay | Admitting: Family Medicine

## 2017-08-27 DIAGNOSIS — Z9884 Bariatric surgery status: Secondary | ICD-10-CM | POA: Diagnosis not present

## 2017-08-27 DIAGNOSIS — K219 Gastro-esophageal reflux disease without esophagitis: Secondary | ICD-10-CM | POA: Insufficient documentation

## 2017-08-27 DIAGNOSIS — R1013 Epigastric pain: Secondary | ICD-10-CM

## 2017-08-27 NOTE — Telephone Encounter (Signed)
Medication sent on 08/27/17

## 2017-08-27 NOTE — Telephone Encounter (Signed)
Copied from [place] [phone]. Topic: Quick Communication - Rx Refill/Question >> Aug 27, 2017 12:21 PM Neva Seat wrote: zolpidem (AMBIEN CR) 12.5 MG CR tablet  Pt needing refills - called  Wed. - Pt is out    [id number] [place], [address] [place] [address] Phone: [phone] Fax: [phone]

## 2017-08-30 DIAGNOSIS — M25661 Stiffness of right knee, not elsewhere classified: Secondary | ICD-10-CM | POA: Diagnosis not present

## 2017-08-30 DIAGNOSIS — R262 Difficulty in walking, not elsewhere classified: Secondary | ICD-10-CM | POA: Diagnosis not present

## 2017-08-30 DIAGNOSIS — M25561 Pain in right knee: Secondary | ICD-10-CM | POA: Diagnosis not present

## 2017-08-30 NOTE — Telephone Encounter (Signed)
Refill was sent to [facility] on 08/27/17. Unable to reach pts wife to make sure aware med was sent to pharmacy.

## 2017-08-30 NOTE — Telephone Encounter (Signed)
Patient notified as instructed by telephone and was advised that he has already picked the script up.

## 2017-08-31 ENCOUNTER — Ambulatory Visit (INDEPENDENT_AMBULATORY_CARE_PROVIDER_SITE_OTHER): Payer: Medicare Other

## 2017-08-31 DIAGNOSIS — Z5181 Encounter for therapeutic drug level monitoring: Secondary | ICD-10-CM | POA: Diagnosis not present

## 2017-08-31 DIAGNOSIS — I4891 Unspecified atrial fibrillation: Secondary | ICD-10-CM | POA: Diagnosis not present

## 2017-08-31 DIAGNOSIS — Z7901 Long term (current) use of anticoagulants: Secondary | ICD-10-CM | POA: Diagnosis not present

## 2017-08-31 DIAGNOSIS — I824Y9 Acute embolism and thrombosis of unspecified deep veins of unspecified proximal lower extremity: Secondary | ICD-10-CM | POA: Diagnosis not present

## 2017-08-31 LAB — POCT INR: INR: 2.3 (ref 2.0–3.0)

## 2017-08-31 NOTE — Progress Notes (Signed)
Cardiology Office Note:    Date:  09/01/2017   ID:  [name], DOB [birth year], MRN [id number]  PCP:  Jinny Sanders, MD  Cardiologist:  Minus Breeding, MD   Referring MD: Jinny Sanders, MD   Chief Complaint  Patient presents with  . Follow-up    near syncope    History of Present Illness:    [name] is a 77 y.o. male with a hx of atrial fibrillation on coumaind, HTN, hx of PE (10 years ago), DM, GERD, COPD, and fibromyalgia. Per notes, possible excessive alcohol use. He had a myoview 09/28/14 that was of poor quality that showed possible partially reversible defect.  He saw Dr. Percival Spanish in clinic on 11/30/16. At that time he was doing well. No medication changes, but it was noted that his blood pressure tends to run low.    Since his last visit with Dr. Percival Spanish, he has been on ABX following right TKA. He was seen in the ED on 06/03/17 (sent by ortho) for evaluation of pain and swelling in his knee. He has seen ortho during that visit and they did not think he has a septic joint. He completed vanc treatment for his knee and is now in PT.  Per phone notes 08/06/17, the pt's wife called describing episodes of weakness, dizziness, and feeling faint.  I saw the patient on 08/10/17 for follow up for these symptoms. He was not orthostatic and had recently been taken off of his home lisinopril. He had one episode of pre-syncope. At that visit, we decreased his lasix to every other day and continued flomax at night.  Carotid duplex with nonobstructive disease (1-39% bilaterally) and echocardiogram with normal EF and mild valvular disease.    He returns today for follow up. He does not bring his BP or HR log, but has had no further dizziness or near syncopal episodes. He had an EEG which was normal. He denies shortness of breath, palpitations, chest pain, and lower extremity swelling. He is doing well with every-other-day lasix.   Past Medical History:  Diagnosis Date  . Arthritis      hands   . Asthma   . Cancer ([place])    SKIN CANCER REMOVED  . Chronic atrial fibrillation (HCC)    a.fib on warfarin- Dr. Percival Spanish follows  . Diverticulosis of colon (without mention of hemorrhage)   . Dysrhythmia    a fib  . ED (erectile dysfunction)   . Fibromyalgia   . GERD (gastroesophageal reflux disease)   . Hard of hearing    wears hearing aids  . Headache   . History of blood clots    behind right knee and then went into left lung 56yrs ago  . History of colon polyps   . History of kidney stones   . Hx of diabetes mellitus    "no longer diabetic since gastric bypass" - no longer taking metformin"  in 2 months "problems with low blood sugar now"  . Hx of gout    but doesn't take any meds  . Inflammation of colonic mucosa    recent admission and release from [facility] 02-28-15-remains on oral antibiotic  . Obesity   . Peripheral vascular disease ([place])   . Pneumonia    last time about 43yrs ago  . Pulmonary embolism ([place])    over 10 yrs ago "many years ago"  . Restless legs syndrome (RLS)   . Unspecified asthma(493.90)    as a child  .  Unspecified essential hypertension    hx of no longer on medication due to gastric bypass     Past Surgical History:  Procedure Laterality Date  . ANTERIOR CERVICAL DECOMP/DISCECTOMY FUSION N/A 05/09/2013   Procedure: ANTERIOR CERVICAL DECOMPRESSION/DISCECTOMY FUSION CERVICAL THREE-FOUR,CERVICAL FOUR-FIVE,CERVICAL FIVE-SIX;  Surgeon: Floyce Stakes, MD;  Location: [facility];  Service: Neurosurgery;  Laterality: N/A;  . ARTERY REPAIR     Left forearm  . BACK SURGERY     x 3  . BREATH TEK H PYLORI  01/08/2011   Procedure: BREATH TEK H PYLORI;  Surgeon: Pedro Earls, MD;  Location: [facility];  Service: General;  Laterality: N/A;  . CARDIOVERSION     x 2 attempts-unsuccessful.  [name] CATARACT EXTRACTION, BILATERAL    . COLONOSCOPY    . CYSTOSCOPY    . ESOPHAGOGASTRODUODENOSCOPY (EGD) WITH PROPOFOL N/A 09/21/2014   Procedure:  ESOPHAGOGASTRODUODENOSCOPY (EGD) WITH PROPOFOL;  Surgeon: Manya Silvas, MD;  Location: [facility] ENDOSCOPY;  Service: Endoscopy;  Laterality: N/A;  . EYE SURGERY     cataract bil  . FOOT SURGERY     Left foot   . GASTRIC ROUX-EN-Y  08/11/2011   Procedure: LAPAROSCOPIC ROUX-EN-Y GASTRIC BYPASS WITH UPPER ENDOSCOPY;  Surgeon: Pedro Earls, MD;  Location: [facility];  Service: General;  Laterality: N/A;  . HAND SURGERY     LEFT  . injections in back     x 18  . JOINT REPLACEMENT    . KNEE SURGERY Left    x 4  . KNEE SURGERY Left    arthroscopy  . LAPAROSCOPIC INTERNAL HERNIA REPAIR N/A 03/08/2015   Procedure: LAPAROSCOPIC INTERNAL HERNIA REPAIR ;  Surgeon: [name], MD;  Location: [facility];  Service: General;  Laterality: N/A;  . LEG SURGERY     FOR NECROTIZING FASCITIS L LEG AND GROIN  . PANNICULECTOMY N/A 08/25/2016   Procedure: PANNICULECTOMY;  Surgeon: [name], MD;  Location: [facility];  Service: General;  Laterality: N/A;  . PENILE PROSTHESIS IMPLANT N/A 07/02/2014   Procedure: PENILE PROTHESIS INFLATABLE 3 PIECE (COLOPLAST) SCROTAL APPROACH;  Surgeon: Kathie Rhodes, MD;  Location: [facility];  Service: Urology;  Laterality: N/A;  . PENILE PROSTHESIS IMPLANT N/A 11/23/2014   Procedure: EXPLORATION AND REVISION OF PENILE PROSTHESIS;  Surgeon: Kathie Rhodes, MD;  Location: [facility];  Service: Urology;  Laterality: N/A;  . PICC INSERTION W/OUT PORT/PUMP    . REPLACEMENT TOTAL KNEE Left    x 2  . SHOULDER ARTHROSCOPY    . SHOULDER SURGERY Left 2014  . TONSILLECTOMY    . TOTAL KNEE ARTHROPLASTY Right 04/27/2017  . TOTAL KNEE ARTHROPLASTY Right 04/27/2017   Procedure: TOTAL KNEE ARTHROPLASTY;  Surgeon: Melrose Nakayama, MD;  Location: [facility];  Service: Orthopedics;  Laterality: Right;  . TOTAL SHOULDER ARTHROPLASTY Left 01/26/2013   Procedure: TOTAL SHOULDER ARTHROPLASTY;  Surgeon: Nita Sells, MD;  Location: [facility];  Service: Orthopedics;  Laterality: Left;  Left total shoulder  arthroplasty    Current Medications: Current Meds  Medication Sig  . acarbose (PRECOSE) 50 MG tablet Take 50 mg by mouth 3 (three) times daily.  [name] acetaminophen-codeine (TYLENOL #3) 300-30 MG tablet   . atorvastatin (LIPITOR) 80 MG tablet Take 1 tablet (80 mg total) by mouth at bedtime.  . bisacodyl (DULCOLAX) 5 MG EC tablet Take 1 tablet (5 mg total) by mouth daily as needed for moderate constipation.  . docusate sodium (COLACE) 100 MG capsule Take 1 capsule (100 mg total) by  mouth 2 (two) times daily.  [name] erythromycin ophthalmic ointment APPLY TO RIGHT EYE 4 TIMES A DAY  . FLUoxetine HCl 60 MG TABS 1 tab po at bedtime  . furosemide (LASIX) 20 MG tablet Take 1 tablet (20 mg total) by mouth every other day.  . gabapentin (NEURONTIN) 300 MG capsule TAKE 1 CAPSULE BY MOUTH TWICE A DAY  . HYDROcodone-acetaminophen (NORCO/VICODIN) 5-325 MG tablet Take 1-2 tablets by mouth every 6 (six) hours as needed.  . hydrocortisone 2.5 % cream APPLICATIONS APPLY ON THE SKIN APPLY ON THE SKIN TWICE A DAY AS NEEDED  . L-Methylfolate-Algae-B12-B6 (METANX) 3-90.314-2-35 MG CAPS Take 1 capsule by mouth 2 (two) times daily.  . lansoprazole (PREVACID) 30 MG capsule TAKE 1 CAPSULE (30 MG TOTAL) BY MOUTH ONCE DAILY.  [name] lidocaine (XYLOCAINE) 5 % ointment APPLY 1 GRAM ( 1 GRAM = 1 INCH) TO THE AFFECTED AREA 2 TIMES A DAY  . LORazepam (ATIVAN) 1 MG tablet Take 0.5 tablets (0.5 mg total) by mouth 2 (two) times daily as needed for anxiety.  [name] nystatin cream (MYCOSTATIN) APPLY TO SKIN TWICE DAILY AS DIRECTED AS NEEDED  . omeprazole (PRILOSEC) 40 MG capsule Take 40 mg by mouth daily.  [name] oxyCODONE (OXY IR/ROXICODONE) 5 MG immediate release tablet TAKE 1 TABLET BY MOUTH EVERY 8-12 HOURS AS NEEDED FOR PAIN  . primidone (MYSOLINE) 50 MG tablet 150 mg in the morning, 100 mg at night  . ranitidine (ZANTAC) 300 MG tablet Take 300 mg by mouth at bedtime.   . sucralfate (CARAFATE) 1 g tablet Take 1 g by mouth daily before  breakfast.  . tamsulosin (FLOMAX) 0.4 MG CAPS capsule Take 0.4 mg by mouth daily.   [name] tiZANidine (ZANAFLEX) 4 MG tablet Take 1 tablet (4 mg total) by mouth every 6 (six) hours as needed.  . Transparent Dressings (NEXCARE TEGADERM 2-3/8"X2-3/4") MISC APPLY TO AREA OF PRESSURE ULCER EVERY OTHER DAY  . warfarin (COUMADIN) 2.5 MG tablet TAKE 1 TO 1.5 TABLETS BY  MOUTH DAILY OR AS DIRECTED  BY COUMADIN CLINIC (Patient taking differently: Take 2.5-3.75 mg by mouth See admin instructions. 2.5 Sunday and Thursday, all other days is 3.75 mg)  . XIIDRA 5 % SOLN INSTILL 1 DROPS INTO BOTH EYES TWICE A DAY  . zolpidem (AMBIEN CR) 12.5 MG CR tablet TAKE 1 TABLET (12.5 MG TOTAL) BY MOUTH AT BEDTIME AS NEEDED FOR SLEEP.     Allergies:   Sulfacetamide sodium; Latex; and Adhesive [tape]   Social History   Socioeconomic History  . Marital status: Married    Spouse name: Not on file  . Number of children: 1  . Years of education: Not on file  . Highest education level: Not on file  Occupational History  . Occupation: Retired    Comment: Investment banker, operational  . Financial resource strain: Not on file  . Food insecurity:    Worry: Not on file    Inability: Not on file  . Transportation needs:    Medical: Not on file    Non-medical: Not on file  Tobacco Use  . Smoking status: Never Smoker  . Smokeless tobacco: Never Used  Substance and Sexual Activity  . Alcohol use: No    Alcohol/week: 0.0 oz  . Drug use: No  . Sexual activity: Yes  Lifestyle  . Physical activity:    Days per week: Not on file    Minutes per session: Not on file  . Stress: Not on file  Relationships  . Social connections:    Talks on phone: Not on file    Gets together: Not on file    Attends religious service: Not on file    Active member of club or organization: Not on file    Attends meetings of clubs or organizations: Not on file    Relationship status: Not on file  Other Topics Concern  . Not on file  Social  History Narrative   DIET: 3 meals, F&V, some water, crystal light, No fast food   Exercise: walks treadmill 30 min      1 Caffeine drinks daily       No living will.            Family History: The patient's family history includes Alzheimer's disease in his sister; Breast cancer in his mother; Cancer in his mother; Colon cancer (age of onset: 73) in his brother; Emphysema in his father; Heart attack in his brother; Prostate cancer in his brother; Uterine cancer in his mother.  ROS:   Please see the history of present illness.     All other systems reviewed and are negative.  EKGs/Labs/Other Studies Reviewed:    The following studies were reviewed today:  Echo 08/18/17: Study Conclusions - Left ventricle: The cavity size was normal. There was moderate   focal basal hypertrophy of the septum. Systolic function was   normal. The estimated ejection fraction was in the range of 55%   to 60%. Wall motion was normal; there were no regional wall   motion abnormalities. - Mitral valve: Calcified annulus. There was mild regurgitation. - Left atrium: The atrium was severely dilated. - Right atrium: The atrium was moderately dilated.  Impressions: - Normal LV systoic function; proximal septal thickening; mild MR;   biatrial enlargement.   Myoview 09/28/14:  There was no ST segment deviation noted during stress.  Defect 1: There is a medium defect of moderate severity present in the mid anterior and apical anterior location.  The left ventricular ejection fraction is normal (55-65%).  This study is suboptimal due to intense GI activity. There is a partially reversible distal anterior and apical defect suggestive of ischemia. Correlate clinically due to poor study quality overall.   EKG:  EKG is ordered today.  The ekg ordered today demonstrates rate-controlled Afib.  Recent Labs: 06/17/2017: ALT 18; BUN 15; Creatinine, Ser 1.38; Hemoglobin 11.1; Platelets 235.0; Potassium 4.7;  Sodium 137  Recent Lipid Panel    Component Value Date/Time   CHOL 130 09/11/2016 1135   TRIG 77.0 09/11/2016 1135   HDL 54.00 09/11/2016 1135   CHOLHDL 2 09/11/2016 1135   VLDL 15.4 09/11/2016 1135   LDLCALC 61 09/11/2016 1135   LDLDIRECT 147.0 03/07/2012 1142    Physical Exam:    VS:  BP 128/82 (BP Location: Left Arm, Patient Position: Sitting)   Pulse 73   Ht 6' (1.829 m)   Wt 191 lb 9.6 oz (86.9 kg)   BMI 25.99 kg/m     Wt Readings from Last 3 Encounters:  09/01/17 191 lb 9.6 oz (86.9 kg)  08/10/17 186 lb (84.4 kg)  08/03/17 190 lb (86.2 kg)     GEN: Well nourished, well developed in no acute distress HEENT: Normal NECK: No JVD; No carotid bruits LYMPHATICS: No lymphadenopathy CARDIAC: Irregular rhythm, regular rate,  RESPIRATORY:  Clear to auscultation without rales, wheezing or rhonchi  ABDOMEN: Soft, non-tender, non-distended MUSCULOSKELETAL:  No edema; No deformity  SKIN: Warm and dry NEUROLOGIC:  Alert and oriented x 3 PSYCHIATRIC:  Normal affect   ASSESSMENT:    1. Atrial fibrillation, permanent ([place])   2. Hypotension, unspecified hypotension type   3. Long term current use of anticoagulant therapy   4. COPD, mild ([place])    PLAN:    In order of problems listed above:  Atrial fibrillation, permanent ([place]) - Plan: EKG 12-Lead Long term current use of anticoagulant therapy - coumadin Rate controlled today. No palpitations. INR 2.3 yesterday. Continue coumadin, no problems with bleeding.   Hypotension, unspecified hypotension type, dizziness, near-syncope C/O of dizziness and near syncope at last visit, no etiology found. He has had no further episodes since last visit. He is doing well with every other day lasix. No lower extremity swelling. I was concerned about dehydration at last visit. He is well-hydrated and avoiding salt.   COPD, mild (HCC) Stable, no SOB.    Follow up with Dr. Percival Spanish in 5 months, sooner if needed. No medication changes     Medication Adjustments/Labs and Tests Ordered: Current medicines are reviewed at length with the patient today.  Concerns regarding medicines are outlined above.  Orders Placed This Encounter  Procedures  . EKG 12-Lead   No orders of the defined types were placed in this encounter.   Signed, Ledora Bottcher, PA  09/01/2017 9:04 AM    [facility]

## 2017-08-31 NOTE — Patient Instructions (Signed)
Description   Start taking 1.5 tablets everyday.  Recheck in 3 weeks. Call if you start any new medications [phone].

## 2017-09-01 ENCOUNTER — Encounter: Payer: Self-pay | Admitting: Physician Assistant

## 2017-09-01 ENCOUNTER — Ambulatory Visit (INDEPENDENT_AMBULATORY_CARE_PROVIDER_SITE_OTHER): Payer: Medicare Other | Admitting: Physician Assistant

## 2017-09-01 VITALS — BP 128/82 | HR 73 | Ht 72.0 in | Wt 191.6 lb

## 2017-09-01 DIAGNOSIS — I482 Chronic atrial fibrillation: Secondary | ICD-10-CM | POA: Diagnosis not present

## 2017-09-01 DIAGNOSIS — R42 Dizziness and giddiness: Secondary | ICD-10-CM | POA: Insufficient documentation

## 2017-09-01 DIAGNOSIS — J449 Chronic obstructive pulmonary disease, unspecified: Secondary | ICD-10-CM | POA: Diagnosis not present

## 2017-09-01 DIAGNOSIS — I4821 Permanent atrial fibrillation: Secondary | ICD-10-CM

## 2017-09-01 DIAGNOSIS — I959 Hypotension, unspecified: Secondary | ICD-10-CM

## 2017-09-01 DIAGNOSIS — Z7901 Long term (current) use of anticoagulants: Secondary | ICD-10-CM

## 2017-09-01 NOTE — Patient Instructions (Signed)
Medication Instructions:  Your physician recommends that you continue on your current medications as directed. Please refer to the Current Medication list given to you today.   Labwork: None ordered  Testing/Procedures: None ordered  Follow-Up: Your physician recommends that you schedule a follow-up appointment in: December 2019 with Dr. Percival Spanish    Any Other Special Instructions Will Be Listed Below (If Applicable).     If you need a refill on your cardiac medications before your next appointment, please call your pharmacy.

## 2017-09-03 DIAGNOSIS — Z471 Aftercare following joint replacement surgery: Secondary | ICD-10-CM | POA: Diagnosis not present

## 2017-09-03 DIAGNOSIS — M25561 Pain in right knee: Secondary | ICD-10-CM | POA: Diagnosis not present

## 2017-09-03 DIAGNOSIS — M25562 Pain in left knee: Secondary | ICD-10-CM | POA: Diagnosis not present

## 2017-09-03 DIAGNOSIS — R262 Difficulty in walking, not elsewhere classified: Secondary | ICD-10-CM | POA: Diagnosis not present

## 2017-09-03 DIAGNOSIS — M25661 Stiffness of right knee, not elsewhere classified: Secondary | ICD-10-CM | POA: Diagnosis not present

## 2017-09-03 DIAGNOSIS — Z96653 Presence of artificial knee joint, bilateral: Secondary | ICD-10-CM | POA: Diagnosis not present

## 2017-09-06 DIAGNOSIS — M25661 Stiffness of right knee, not elsewhere classified: Secondary | ICD-10-CM | POA: Diagnosis not present

## 2017-09-06 DIAGNOSIS — M25561 Pain in right knee: Secondary | ICD-10-CM | POA: Diagnosis not present

## 2017-09-06 DIAGNOSIS — R262 Difficulty in walking, not elsewhere classified: Secondary | ICD-10-CM | POA: Diagnosis not present

## 2017-09-08 ENCOUNTER — Ambulatory Visit (INDEPENDENT_AMBULATORY_CARE_PROVIDER_SITE_OTHER): Payer: Medicare Other | Admitting: Internal Medicine

## 2017-09-08 ENCOUNTER — Encounter: Payer: Self-pay | Admitting: Internal Medicine

## 2017-09-08 VITALS — BP 106/68 | HR 107 | Temp 97.8°F | Wt 189.0 lb

## 2017-09-08 DIAGNOSIS — R51 Headache: Secondary | ICD-10-CM

## 2017-09-08 DIAGNOSIS — R519 Headache, unspecified: Secondary | ICD-10-CM

## 2017-09-08 DIAGNOSIS — H9201 Otalgia, right ear: Secondary | ICD-10-CM

## 2017-09-08 NOTE — Progress Notes (Signed)
Subjective:    Patient ID: [name], male    DOB: 1940-04-24, 77 y.o.   MRN: [id number]  HPI  Pt presents to the clinic today with c/o headache and right ear pain. He reports this started 3-4 days ago. The headache is located in the forehead. He describes the headache as pressure. He denies dizziness or visual changes. He describes the ear pain as achy, but denies loss of hearing. He has not had any drainage from the ear. He had some URI symptoms 3 weeks ago but that has resolved. He has put OTC eye drops (because he did not have ear drops) and reports some relief.   Review of Systems  Past Medical History:  Diagnosis Date  . Arthritis    hands   . Asthma   . Cancer ([place])    SKIN CANCER REMOVED  . Chronic atrial fibrillation (HCC)    a.fib on warfarin- Dr. Percival Spanish follows  . Diverticulosis of colon (without mention of hemorrhage)   . Dysrhythmia    a fib  . ED (erectile dysfunction)   . Fibromyalgia   . GERD (gastroesophageal reflux disease)   . Hard of hearing    wears hearing aids  . Headache   . History of blood clots    behind right knee and then went into left lung 48yrs ago  . History of colon polyps   . History of kidney stones   . Hx of diabetes mellitus    "no longer diabetic since gastric bypass" - no longer taking metformin"  in 2 months "problems with low blood sugar now"  . Hx of gout    but doesn't take any meds  . Inflammation of colonic mucosa    recent admission and release from [facility] 02-28-15-remains on oral antibiotic  . Obesity   . Peripheral vascular disease ([place])   . Pneumonia    last time about 48yrs ago  . Pulmonary embolism ([place])    over 10 yrs ago "many years ago"  . Restless legs syndrome (RLS)   . Unspecified asthma(493.90)    as a child  . Unspecified essential hypertension    hx of no longer on medication due to gastric bypass     Current Outpatient Medications  Medication Sig Dispense Refill  . acarbose (PRECOSE) 50 MG  tablet Take 50 mg by mouth 3 (three) times daily.  4  . acetaminophen-codeine (TYLENOL #3) 300-30 MG tablet     . atorvastatin (LIPITOR) 80 MG tablet Take 1 tablet (80 mg total) by mouth at bedtime. 90 tablet 3  . bisacodyl (DULCOLAX) 5 MG EC tablet Take 1 tablet (5 mg total) by mouth daily as needed for moderate constipation. 15 tablet 0  . docusate sodium (COLACE) 100 MG capsule Take 1 capsule (100 mg total) by mouth 2 (two) times daily. 30 capsule 0  . erythromycin ophthalmic ointment APPLY TO RIGHT EYE 4 TIMES A DAY  2  . FLUoxetine HCl 60 MG TABS 1 tab po at bedtime 30 tablet 3  . furosemide (LASIX) 20 MG tablet Take 1 tablet (20 mg total) by mouth every other day. 180 tablet 1  . gabapentin (NEURONTIN) 300 MG capsule TAKE 1 CAPSULE BY MOUTH TWICE A DAY 180 capsule 1  . HYDROcodone-acetaminophen (NORCO/VICODIN) 5-325 MG tablet Take 1-2 tablets by mouth every 6 (six) hours as needed. 6 tablet 0  . hydrocortisone 2.5 % cream APPLICATIONS APPLY ON THE SKIN APPLY ON THE SKIN TWICE A DAY AS  NEEDED  0  . L-Methylfolate-Algae-B12-B6 (METANX) 3-90.314-2-35 MG CAPS Take 1 capsule by mouth 2 (two) times daily.    . lansoprazole (PREVACID) 30 MG capsule TAKE 1 CAPSULE (30 MG TOTAL) BY MOUTH ONCE DAILY.  3  . lidocaine (XYLOCAINE) 5 % ointment APPLY 1 GRAM ( 1 GRAM = 1 INCH) TO THE AFFECTED AREA 2 TIMES A DAY  12  . LORazepam (ATIVAN) 1 MG tablet Take 0.5 tablets (0.5 mg total) by mouth 2 (two) times daily as needed for anxiety. 15 tablet 0  . nystatin cream (MYCOSTATIN) APPLY TO SKIN TWICE DAILY AS DIRECTED AS NEEDED  2  . omeprazole (PRILOSEC) 40 MG capsule Take 40 mg by mouth daily.    [name] oxyCODONE (OXY IR/ROXICODONE) 5 MG immediate release tablet TAKE 1 TABLET BY MOUTH EVERY 8-12 HOURS AS NEEDED FOR PAIN  0  . primidone (MYSOLINE) 50 MG tablet 150 mg in the morning, 100 mg at night 450 tablet 1  . ranitidine (ZANTAC) 300 MG tablet Take 300 mg by mouth at bedtime.     . sucralfate (CARAFATE) 1 g  tablet Take 1 g by mouth daily before breakfast.    . tamsulosin (FLOMAX) 0.4 MG CAPS capsule Take 0.4 mg by mouth daily.     [name] tiZANidine (ZANAFLEX) 4 MG tablet Take 1 tablet (4 mg total) by mouth every 6 (six) hours as needed. 40 tablet 1  . Transparent Dressings (NEXCARE TEGADERM 2-3/8"X2-3/4") MISC APPLY TO AREA OF PRESSURE ULCER EVERY OTHER DAY  0  . warfarin (COUMADIN) 2.5 MG tablet TAKE 1 TO 1.5 TABLETS BY  MOUTH DAILY OR AS DIRECTED  BY COUMADIN CLINIC (Patient taking differently: Take 2.5-3.75 mg by mouth See admin instructions. 2.5 Sunday and Thursday, all other days is 3.75 mg) 120 tablet 0  . XIIDRA 5 % SOLN INSTILL 1 DROPS INTO BOTH EYES TWICE A DAY  99  . zolpidem (AMBIEN CR) 12.5 MG CR tablet TAKE 1 TABLET (12.5 MG TOTAL) BY MOUTH AT BEDTIME AS NEEDED FOR SLEEP. 30 tablet 0   No current facility-administered medications for this visit.     Allergies  Allergen Reactions  . Sulfacetamide Sodium Swelling    throat swelling  . Latex Itching  . Adhesive [Tape] Other (See Comments)    Tears skin-- "No tape of any kind, they all tear skin right off" Tears skin-- "No tape of any kind, they all tear skin right off"    Family History  Problem Relation Age of Onset  . Uterine cancer Mother        mets  . Breast cancer Mother   . Cancer Mother        cervical  . Emphysema Father   . Alzheimer's disease Sister   . Prostate cancer Brother   . Heart attack Brother   . Colon cancer Brother 39    Social History   Socioeconomic History  . Marital status: Married    Spouse name: Not on file  . Number of children: 1  . Years of education: Not on file  . Highest education level: Not on file  Occupational History  . Occupation: Retired    Comment: Investment banker, operational  . Financial resource strain: Not on file  . Food insecurity:    Worry: Not on file    Inability: Not on file  . Transportation needs:    Medical: Not on file    Non-medical: Not on file  Tobacco Use   . Smoking  status: Never Smoker  . Smokeless tobacco: Never Used  Substance and Sexual Activity  . Alcohol use: No    Alcohol/week: 0.0 oz  . Drug use: No  . Sexual activity: Yes  Lifestyle  . Physical activity:    Days per week: Not on file    Minutes per session: Not on file  . Stress: Not on file  Relationships  . Social connections:    Talks on phone: Not on file    Gets together: Not on file    Attends religious service: Not on file    Active member of club or organization: Not on file    Attends meetings of clubs or organizations: Not on file    Relationship status: Not on file  . Intimate partner violence:    Fear of current or ex partner: Not on file    Emotionally abused: Not on file    Physically abused: Not on file    Forced sexual activity: Not on file  Other Topics Concern  . Not on file  Social History Narrative   DIET: 3 meals, F&V, some water, crystal light, No fast food   Exercise: walks treadmill 30 min      1 Caffeine drinks daily       No living will.            Constitutional: Pt reports headache. Denies fever, malaise, fatigue, or abrupt weight changes.  HEENT: Pt reports ear pain. Denies eye pain, eye redness, ringing in the ears, wax buildup, runny nose, nasal congestion, bloody nose, or sore throat. Neurological: Denies dizziness, difficulty with memory, difficulty with speech or problems with balance and coordination.    No other specific complaints in a complete review of systems (except as listed in HPI above).     Objective:   Physical Exam   BP 106/68   Pulse (!) 107   Temp 97.8 F (36.6 C) (Oral)   Wt 189 lb (85.7 kg)   SpO2 97%   BMI 25.63 kg/m  Wt Readings from Last 3 Encounters:  09/08/17 189 lb (85.7 kg)  09/01/17 191 lb 9.6 oz (86.9 kg)  08/10/17 186 lb (84.4 kg)    General: Appears his stated age, in NAD. HEENT: Head: normal shape and size, no sinus tenderness noted;  Ears: Tm's gray and intact, normal light  reflex, mild wax buildup in left canal; Nose: mucosa pink and moist, septum midline; Throat/Mouth: Teeth present, mucosa pink and moist, no exudate, lesions or ulcerations noted.  Neck:  No adenopathy noted. Cardiovascular: Tachycardic with irregular rhythm.  Pulmonary/Chest: Normal effort and positive vesicular breath sounds. No respiratory distress. No wheezes, rales or ronchi noted.   BMET    Component Value Date/Time   NA 137 06/17/2017 0811   K 4.7 06/17/2017 0811   CL 105 06/17/2017 0811   CO2 26 06/17/2017 0811   GLUCOSE 155 (H) 06/17/2017 0811   BUN 15 06/17/2017 0811   CREATININE 1.38 06/17/2017 0811   CALCIUM 8.7 06/17/2017 0811   GFRNONAA 31 (L) 06/03/2017 1249   GFRAA 37 (L) 06/03/2017 1249    Lipid Panel     Component Value Date/Time   CHOL 130 09/11/2016 1135   TRIG 77.0 09/11/2016 1135   HDL 54.00 09/11/2016 1135   CHOLHDL 2 09/11/2016 1135   VLDL 15.4 09/11/2016 1135   LDLCALC 61 09/11/2016 1135    CBC    Component Value Date/Time   WBC 5.1 06/17/2017 0811   RBC 3.46 (L)  06/17/2017 0811   HGB 11.1 (L) 06/17/2017 0811   HCT 32.6 (L) 06/17/2017 0811   PLT 235.0 06/17/2017 0811   MCV 94.2 06/17/2017 0811   MCH 31.6 06/03/2017 1249   MCHC 34.1 06/17/2017 0811   RDW 14.7 06/17/2017 0811   LYMPHSABS 1.0 06/17/2017 0811   MONOABS 0.6 06/17/2017 0811   EOSABS 0.2 06/17/2017 0811   BASOSABS 0.1 06/17/2017 0811    Hgb A1C Lab Results  Component Value Date   HGBA1C 7.9 (H) 04/16/2017          Assessment & Plan:   Headache, Otalgia:  No sings of bacterial infection Start Flonase 1 spray right nostril BID x 1 week Can take Tylenol 500 mg every 8 hours as needed for headache  Return precautions discussed Webb Silversmith, NP

## 2017-09-08 NOTE — Patient Instructions (Signed)

## 2017-09-13 DIAGNOSIS — M50322 Other cervical disc degeneration at C5-C6 level: Secondary | ICD-10-CM | POA: Diagnosis not present

## 2017-09-13 DIAGNOSIS — M9901 Segmental and somatic dysfunction of cervical region: Secondary | ICD-10-CM | POA: Diagnosis not present

## 2017-09-14 DIAGNOSIS — M50322 Other cervical disc degeneration at C5-C6 level: Secondary | ICD-10-CM | POA: Diagnosis not present

## 2017-09-14 DIAGNOSIS — M9901 Segmental and somatic dysfunction of cervical region: Secondary | ICD-10-CM | POA: Diagnosis not present

## 2017-09-16 ENCOUNTER — Other Ambulatory Visit: Payer: Self-pay | Admitting: Family Medicine

## 2017-09-16 DIAGNOSIS — M50322 Other cervical disc degeneration at C5-C6 level: Secondary | ICD-10-CM | POA: Diagnosis not present

## 2017-09-16 DIAGNOSIS — M9901 Segmental and somatic dysfunction of cervical region: Secondary | ICD-10-CM | POA: Diagnosis not present

## 2017-09-16 NOTE — Telephone Encounter (Signed)
Left message for [name] to call back and clarify if [name] is currently taking omeprazole 40 mg and if he is taking it one time a day or 2 times a day.

## 2017-09-17 ENCOUNTER — Ambulatory Visit: Payer: Medicare Other

## 2017-09-17 NOTE — Telephone Encounter (Signed)
Last office visit 09/08/2017 with Avie Echevaria for HA & Ear Pain.  Omeprazole is on medication list as a historical med.  There is also Prevacid & Zantac on medication list.  I did leave a message for Mrs. Churchwell to call me back to verify what patient is currently taking but have not received a return call.  When I try and reconcile patient's medication at office visit he always states he doesn't know what he is taking because his wife takes care of his medication.  Please advise.   Refill?

## 2017-09-20 ENCOUNTER — Telehealth: Payer: Self-pay | Admitting: Cardiology

## 2017-09-20 DIAGNOSIS — R0781 Pleurodynia: Secondary | ICD-10-CM | POA: Diagnosis not present

## 2017-09-20 DIAGNOSIS — M25522 Pain in left elbow: Secondary | ICD-10-CM | POA: Diagnosis not present

## 2017-09-20 NOTE — Telephone Encounter (Signed)
New Message     *STAT* If patient is at the pharmacy, call can be transferred to refill team.   1. Which medications need to be refilled? (please list name of each medication and dose if known) warfarin (COUMADIN) 2.5 MG tablet  2. Which pharmacy/location (including street and city if local pharmacy) is medication to be sent to? [facility] ([place], [place]  3. Do they need a 30 day or 90 day supply? 90     Patient is going to run out so they also want a rx to be sent to the local pharmacy for 30 days. The local pharmacy is  [id number] - [place], [place]

## 2017-09-21 ENCOUNTER — Ambulatory Visit (INDEPENDENT_AMBULATORY_CARE_PROVIDER_SITE_OTHER): Payer: Medicare Other

## 2017-09-21 ENCOUNTER — Other Ambulatory Visit: Payer: Self-pay

## 2017-09-21 DIAGNOSIS — N401 Enlarged prostate with lower urinary tract symptoms: Secondary | ICD-10-CM | POA: Diagnosis not present

## 2017-09-21 DIAGNOSIS — I4891 Unspecified atrial fibrillation: Secondary | ICD-10-CM

## 2017-09-21 DIAGNOSIS — R8279 Other abnormal findings on microbiological examination of urine: Secondary | ICD-10-CM | POA: Diagnosis not present

## 2017-09-21 DIAGNOSIS — R3912 Poor urinary stream: Secondary | ICD-10-CM | POA: Diagnosis not present

## 2017-09-21 DIAGNOSIS — Z5181 Encounter for therapeutic drug level monitoring: Secondary | ICD-10-CM

## 2017-09-21 DIAGNOSIS — I824Y9 Acute embolism and thrombosis of unspecified deep veins of unspecified proximal lower extremity: Secondary | ICD-10-CM

## 2017-09-21 DIAGNOSIS — Z7901 Long term (current) use of anticoagulants: Secondary | ICD-10-CM | POA: Diagnosis not present

## 2017-09-21 DIAGNOSIS — N403 Nodular prostate with lower urinary tract symptoms: Secondary | ICD-10-CM | POA: Diagnosis not present

## 2017-09-21 LAB — POCT INR: INR: 3.8 — AB (ref 2.0–3.0)

## 2017-09-21 MED ORDER — WARFARIN SODIUM 2.5 MG PO TABS: ORAL_TABLET | ORAL | 0 refills | Status: DC

## 2017-09-21 NOTE — Patient Instructions (Signed)
  Description   Take 1 tablet today, then start taking 1.5 tablets everyday except 1 tablet on Thursdays.  Recheck in 3 weeks. Call if you start any new medications [phone].

## 2017-09-23 ENCOUNTER — Telehealth: Payer: Self-pay

## 2017-09-23 NOTE — Telephone Encounter (Signed)
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    Procedure: prostate biopsy Date of procedure: 10/07/17  CHADS2-VASc score of  4 (CHF, HTN, AGE, DM2, stroke/tia x 2, PAD, AGE, male)   Per office protocol, patient can hold warfarin for 5 days prior to procedure.   Patient will not need bridging with Lovenox (enoxaparin) around procedure.

## 2017-09-23 NOTE — Telephone Encounter (Signed)
Pt's wife aware, rescheduled follow-up appt from 10/12/17 to 10/21/17 after prostate biopsy and pt is back on Warfarin.

## 2017-09-23 NOTE — Telephone Encounter (Signed)
Pt's wife called to report pt will be having prostate biopsy on 10/07/17.  Pt has been instructed by urologist to hold Warfarin 5 days prior to procedure.  Please advise if ok to hold Warfarin prior to procedure.  Thanks

## 2017-09-24 ENCOUNTER — Ambulatory Visit: Payer: Medicare Other | Admitting: Family Medicine

## 2017-09-24 ENCOUNTER — Other Ambulatory Visit: Payer: Self-pay | Admitting: Family Medicine

## 2017-09-27 NOTE — Telephone Encounter (Signed)
Last office visit 09/08/2017 with [name] for headache. CPE scheduled for 10/08/2017 Last refilled 08/27/2017 for #30 with no refills.  Ok to refill?

## 2017-10-01 ENCOUNTER — Ambulatory Visit: Payer: Medicare Other

## 2017-10-04 DIAGNOSIS — M25522 Pain in left elbow: Secondary | ICD-10-CM | POA: Diagnosis not present

## 2017-10-04 DIAGNOSIS — R0781 Pleurodynia: Secondary | ICD-10-CM | POA: Diagnosis not present

## 2017-10-07 DIAGNOSIS — R972 Elevated prostate specific antigen [PSA]: Secondary | ICD-10-CM | POA: Diagnosis not present

## 2017-10-07 DIAGNOSIS — N411 Chronic prostatitis: Secondary | ICD-10-CM | POA: Diagnosis not present

## 2017-10-07 DIAGNOSIS — N403 Nodular prostate with lower urinary tract symptoms: Secondary | ICD-10-CM | POA: Diagnosis not present

## 2017-10-07 DIAGNOSIS — R8279 Other abnormal findings on microbiological examination of urine: Secondary | ICD-10-CM | POA: Diagnosis not present

## 2017-10-07 DIAGNOSIS — C61 Malignant neoplasm of prostate: Secondary | ICD-10-CM | POA: Diagnosis not present

## 2017-10-08 ENCOUNTER — Ambulatory Visit: Payer: Medicare Other | Admitting: Family Medicine

## 2017-10-13 ENCOUNTER — Other Ambulatory Visit: Payer: Medicare Other

## 2017-10-13 ENCOUNTER — Telehealth: Payer: Self-pay

## 2017-10-13 ENCOUNTER — Ambulatory Visit: Payer: Medicare Other

## 2017-10-13 DIAGNOSIS — E7800 Pure hypercholesterolemia, unspecified: Secondary | ICD-10-CM

## 2017-10-13 DIAGNOSIS — E1149 Type 2 diabetes mellitus with other diabetic neurological complication: Secondary | ICD-10-CM

## 2017-10-13 NOTE — Telephone Encounter (Signed)
CPE labs ordered. Routing to PCP for review and approval.

## 2017-10-14 ENCOUNTER — Encounter: Payer: Self-pay | Admitting: *Deleted

## 2017-10-14 ENCOUNTER — Other Ambulatory Visit (INDEPENDENT_AMBULATORY_CARE_PROVIDER_SITE_OTHER): Payer: Medicare Other

## 2017-10-14 ENCOUNTER — Ambulatory Visit (INDEPENDENT_AMBULATORY_CARE_PROVIDER_SITE_OTHER): Payer: Medicare Other | Admitting: Family Medicine

## 2017-10-14 ENCOUNTER — Encounter: Payer: Self-pay | Admitting: Family Medicine

## 2017-10-14 ENCOUNTER — Ambulatory Visit (INDEPENDENT_AMBULATORY_CARE_PROVIDER_SITE_OTHER): Payer: Medicare Other

## 2017-10-14 ENCOUNTER — Other Ambulatory Visit (HOSPITAL_COMMUNITY): Payer: Self-pay | Admitting: Urology

## 2017-10-14 VITALS — BP 124/70 | HR 76 | Temp 97.9°F | Ht 72.25 in | Wt 190.2 lb

## 2017-10-14 VITALS — BP 140/90 | HR 80 | Temp 97.9°F | Ht 72.25 in | Wt 190.2 lb

## 2017-10-14 DIAGNOSIS — Z0000 Encounter for general adult medical examination without abnormal findings: Secondary | ICD-10-CM

## 2017-10-14 DIAGNOSIS — F330 Major depressive disorder, recurrent, mild: Secondary | ICD-10-CM | POA: Diagnosis not present

## 2017-10-14 DIAGNOSIS — I482 Chronic atrial fibrillation: Secondary | ICD-10-CM | POA: Diagnosis not present

## 2017-10-14 DIAGNOSIS — J449 Chronic obstructive pulmonary disease, unspecified: Secondary | ICD-10-CM | POA: Diagnosis not present

## 2017-10-14 DIAGNOSIS — E7800 Pure hypercholesterolemia, unspecified: Secondary | ICD-10-CM

## 2017-10-14 DIAGNOSIS — E1149 Type 2 diabetes mellitus with other diabetic neurological complication: Secondary | ICD-10-CM

## 2017-10-14 DIAGNOSIS — C61 Malignant neoplasm of prostate: Secondary | ICD-10-CM

## 2017-10-14 DIAGNOSIS — E1143 Type 2 diabetes mellitus with diabetic autonomic (poly)neuropathy: Secondary | ICD-10-CM

## 2017-10-14 DIAGNOSIS — I4821 Permanent atrial fibrillation: Secondary | ICD-10-CM

## 2017-10-14 LAB — LIPID PANEL
CHOL/HDL RATIO: 3
Cholesterol: 118 mg/dL (ref 0–200)
HDL: 46 mg/dL (ref >39.00)
LDL Cholesterol: 56 mg/dL (ref 0–99)
NONHDL: 72.42
TRIGLYCERIDES: 84 mg/dL (ref 0.0–149.0)
VLDL: 16.8 mg/dL (ref 0.0–40.0)

## 2017-10-14 LAB — HEMOGLOBIN A1C: HEMOGLOBIN A1C: 6.6 % — AB (ref 4.6–6.5)

## 2017-10-14 NOTE — Progress Notes (Signed)
Subjective:    Patient ID: [name], male    DOB: 18-Feb-1940, 77 y.o.   MRN: [id number]  HPI  The patient presents for annual medicare wellness, complete physical and review of chronic health problems. He/She also has the following acute concerns today:  The patient saw Candis Musa, LPN for medicare wellness. Note reviewed in detail and important notes copied below.  Health maintenance:  Flu vaccine - addressed Colonoscopy - addressed A1C - completed  Abnormal screenings:   Fall risk - hx of single fall Fall Risk  10/14/2017 12/22/2016 10/30/2016 09/11/2016 05/19/2016  Falls in the past year? Yes Yes Yes No No  Comment foot became entangled in bed cover; bruising to ribs - - - -  Number falls in past yr: 1 2 or more - - -  Injury with Fall? Yes No - - -  Follow up - - - - -   Mini-Cog score: 19/20 MMSE - Mini Mental State Exam 10/14/2017 09/11/2016 06/05/2015  Orientation to time 5 5 5   Orientation to Place 5 5 5   Registration 3 3 3   Attention/ Calculation 0 0 0  Recall 3 3 3   Language- name 2 objects 0 0 0  Language- repeat 1 1 1   Language- follow 3 step command 2 3 3   Language- follow 3 step command-comments unable to follow 1 step of 3 step command - -  Language- read & follow direction 0 0 0  Write a sentence 0 - 0  Copy design 0 0 0  Total score 19 - 20    Patient concerns:   Recently diagnosed with prostate cancer.    10/14/17 Today Elevated Cholesterol: LDL at goal < 70 on lipitor 20 mg daily . Lab Results  Component Value Date   CHOL 118 10/14/2017   HDL 46.00 10/14/2017   LDLCALC 56 10/14/2017   LDLDIRECT 147.0 03/07/2012   TRIG 84.0 10/14/2017   CHOLHDL 3 10/14/2017       Diet compliance:Great S/P gatric bypass,    Wt Readings from Last 3 Encounters:  10/14/17 190 lb 4 oz (86.3 kg)  10/14/17 190 lb 4 oz (86.3 kg)  09/08/17 189 lb (85.7 kg)    Diabetes: well controlled on no med. Lab Results  Component Value Date   HGBA1C 6.6 (H) 10/14/2017  Using medications without difficulties:on no.  Hypoglycemic episodes:None  Hyperglycemic episodes:None  Feet problems:none, mild burning in feet well controlled on gabapentin. Blood Sugars averaging:2 hour postprandial 130-140 eye exam within last year: yes  Hypertension: Well controlled on lisinopril, toprol xl, lasix  BP Readings from Last 3 Encounters:  10/14/17 124/70  10/14/17 140/90  09/08/17 106/68  Using medication without problems or lightheadedness: None  Chest pain with exertion:None  Edema:None  Short of breath:None  Average home ZPH:XTAV  Other issues:Afib on coumadin   Depression, well controlled on no med.   AFib followed by Dr. Percival Spanish. Last OV reviewed from  No changes made. On coumadin.  Tremor (markedly better on low-dose primidone) and peripheral neuropathy causing gait instability: followed by Dr. Carles Collet, neuro.   Treating migraine with Botox as well.  Exercise: Daily on treadmill 15-20 min  Diet; limited but healthy  New Dx prostate cancer: followed by [facility], Dr. Alinda Money Has upcoming CT scan planned to assess  Depression, anxiety: well controlled on fluoxetine.  Mild persitent asthma/mild COPD: well controlled. No daily cough, no nighttime cough. No SOB.   Social History /Family History/Past Medical History reviewed in detail and  updated in EMR if needed. Blood pressure 124/70, pulse 76, temperature 97.9 F (36.6 C), temperature source Oral, height 6' 0.25" (1.835 m), weight 190 lb 4 oz (86.3 kg).   Review of Systems  Constitutional: Negative for fatigue and fever.  HENT: Negative for ear pain.   Eyes: Negative for pain.  Respiratory: Negative for cough and shortness of breath.   Cardiovascular: Negative for chest pain, palpitations and leg swelling.  Gastrointestinal: Negative for abdominal pain.  Genitourinary: Negative for dysuria.  Musculoskeletal: Negative for arthralgias.  Neurological: Negative  for syncope, light-headedness and headaches.  Psychiatric/Behavioral: Negative for dysphoric mood.       Objective:   Physical Exam  Constitutional: He appears well-developed and well-nourished.  Non-toxic appearance. He does not appear ill. No distress.  HENT:  Head: Normocephalic and atraumatic.  Right Ear: Hearing, tympanic membrane, external ear and ear canal normal.  Left Ear: Hearing, tympanic membrane, external ear and ear canal normal.  Nose: Nose normal.  Mouth/Throat: Uvula is midline, oropharynx is clear and moist and mucous membranes are normal.  Eyes: Pupils are equal, round, and reactive to light. Conjunctivae, EOM and lids are normal. Lids are everted and swept, no foreign bodies found.  Neck: Trachea normal, normal range of motion and phonation normal. Neck supple. Carotid bruit is not present. No thyroid mass and no thyromegaly present.  Cardiovascular: Normal rate, S1 normal, S2 normal, intact distal pulses and normal pulses. An irregularly irregular rhythm present. Exam reveals no gallop.  No murmur heard. Pulmonary/Chest: Breath sounds normal. He has no wheezes. He has no rhonchi. He has no rales.  Abdominal: Soft. Normal appearance and bowel sounds are normal. There is no hepatosplenomegaly. There is no tenderness. There is no rebound, no guarding and no CVA tenderness. No hernia.  Lymphadenopathy:    He has no cervical adenopathy.  Neurological: He is alert. He has normal strength and normal reflexes. No cranial nerve deficit or sensory deficit. Gait normal.  Skin: Skin is warm, dry and intact. No rash noted.  Psychiatric: He has a normal mood and affect. His speech is normal and behavior is normal. Judgment normal.          Assessment & Plan:  The patient's preventative maintenance and recommended screening tests for an annual wellness exam were reviewed in full today. Brought up to date unless services declined.  Counselled on the importance of diet,  exercise, and its role in overall health and mortality. The patient's FH and SH was reviewed, including their home life, tobacco status, and drug and alcohol status.

## 2017-10-14 NOTE — Assessment & Plan Note (Signed)
Well controlled. Continue current medication.  

## 2017-10-14 NOTE — Assessment & Plan Note (Signed)
Stabel control on SSRI.

## 2017-10-14 NOTE — Assessment & Plan Note (Signed)
Due to DM stable control.

## 2017-10-14 NOTE — Patient Instructions (Signed)
Keep up the regular exercise and healthy eating.

## 2017-10-14 NOTE — Progress Notes (Signed)
PCP notes:   Health maintenance:  Flu vaccine - addressed Colonoscopy - addressed A1C - completed  Abnormal screenings:   Fall risk - hx of single fall Fall Risk  10/14/2017 12/22/2016 10/30/2016 09/11/2016 05/19/2016  Falls in the past year? Yes Yes Yes No No  Comment foot became entangled in bed cover; bruising to ribs - - - -  Number falls in past yr: 1 2 or more - - -  Injury with Fall? Yes No - - -  Follow up - - - - -   Mini-Cog score: 19/20 MMSE - Mini Mental State Exam 10/14/2017 09/11/2016 06/05/2015  Orientation to time 5 5 5   Orientation to Place 5 5 5   Registration 3 3 3   Attention/ Calculation 0 0 0  Recall 3 3 3   Language- name 2 objects 0 0 0  Language- repeat 1 1 1   Language- follow 3 step command 2 3 3   Language- follow 3 step command-comments unable to follow 1 step of 3 step command - -  Language- read & follow direction 0 0 0  Write a sentence 0 - 0  Copy design 0 0 0  Total score 19 - 20    Patient concerns:   Recently diagnosed with prostate cancer.  Nurse concerns:  None  Next PCP appt:   10/14/17 @ 1400

## 2017-10-14 NOTE — Patient Instructions (Signed)
[name] , Thank you for taking time to come for your Medicare Wellness Visit. I appreciate your ongoing commitment to your health goals. Please review the following plan we discussed and let me know if I can assist you in the future.   These are the goals we discussed: Goals    . Increase physical activity     Starting 10/14/2017, I will continue to walk on treadmill 15-25 minutes daily.        This is a list of the screening recommended for you and due dates:  Health Maintenance  Topic Date Due  . Flu Shot  05/11/2018*  . Colon Cancer Screening  02/09/2019*  . Complete foot exam   04/10/2018  . Hemoglobin A1C  04/14/2018  . Eye exam for diabetics  09/10/2018  . Urine Protein Check  09/10/2018  . Tetanus Vaccine  06/05/2025  . Pneumonia vaccines  Completed  *Topic was postponed. The date shown is not the original due date.   Preventive Care for Adults  A healthy lifestyle and preventive care can promote health and wellness. Preventive health guidelines for adults include the following key practices.  . A routine yearly physical is a good way to check with your health care provider about your health and preventive screening. It is a chance to share any concerns and updates on your health and to receive a thorough exam.  . Visit your dentist for a routine exam and preventive care every 6 months. Brush your teeth twice a day and floss once a day. Good oral hygiene prevents tooth decay and gum disease.  . The frequency of eye exams is based on your age, health, family medical history, use  of contact lenses, and other factors. Follow your health care provider's recommendations for frequency of eye exams.  . Eat a healthy diet. Foods like vegetables, fruits, whole grains, low-fat dairy products, and lean protein foods contain the nutrients you need without too many calories. Decrease your intake of foods high in solid fats, added sugars, and salt. Eat the right amount of calories for  you. Get information about a proper diet from your health care provider, if necessary.  . Regular physical exercise is one of the most important things you can do for your health. Most adults should get at least 150 minutes of moderate-intensity exercise (any activity that increases your heart rate and causes you to sweat) each week. In addition, most adults need muscle-strengthening exercises on 2 or more days a week.  Silver Sneakers may be a benefit available to you. To determine eligibility, you may visit the website: www.silversneakers.com or contact program at 419-685-9868 Mon-Fri between 8AM-8PM.   . Maintain a healthy weight. The body mass index (BMI) is a screening tool to identify possible weight problems. It provides an estimate of body fat based on height and weight. Your health care provider can find your BMI and can help you achieve or maintain a healthy weight.   For adults 20 years and older: ? A BMI below 18.5 is considered underweight. ? A BMI of 18.5 to 24.9 is normal. ? A BMI of 25 to 29.9 is considered overweight. ? A BMI of 30 and above is considered obese.   . Maintain normal blood lipids and cholesterol levels by exercising and minimizing your intake of saturated fat. Eat a balanced diet with plenty of fruit and vegetables. Blood tests for lipids and cholesterol should begin at age 78 and be repeated every 5 years. If your  lipid or cholesterol levels are high, you are over 50, or you are at high risk for heart disease, you may need your cholesterol levels checked more frequently. Ongoing high lipid and cholesterol levels should be treated with medicines if diet and exercise are not working.  . If you smoke, find out from your health care provider how to quit. If you do not use tobacco, please do not start.  . If you choose to drink alcohol, please do not consume more than 2 drinks per day. One drink is considered to be 12 ounces (355 mL) of beer, 5 ounces (148 mL) of  wine, or 1.5 ounces (44 mL) of liquor.  . If you are 54-25 years old, ask your health care provider if you should take aspirin to prevent strokes.  . Use sunscreen. Apply sunscreen liberally and repeatedly throughout the day. You should seek shade when your shadow is shorter than you. Protect yourself by wearing long sleeves, pants, a wide-brimmed hat, and sunglasses year round, whenever you are outdoors.  . Once a month, do a whole body skin exam, using a mirror to look at the skin on your back. Tell your health care provider of new moles, moles that have irregular borders, moles that are larger than a pencil eraser, or moles that have changed in shape or color.

## 2017-10-14 NOTE — Progress Notes (Signed)
Subjective:   [name] is a 77 y.o. male who presents for Medicare Annual/Subsequent preventive examination.  Review of Systems:  N/A Cardiac Risk Factors include: advanced age (>12men, >19 women);diabetes mellitus;hypertension;male gender     Objective:    Vitals: BP 140/90 (BP Location: Right Arm, Patient Position: Sitting, Cuff Size: Normal) Comment: BP medications not taken  Pulse 80   Temp 97.9 F (36.6 C) (Oral)   Ht 6' 0.25" (1.835 m) Comment: shoes  Wt 190 lb 4 oz (86.3 kg)   SpO2 97%   BMI 25.62 kg/m   Body mass index is 25.62 kg/m.  Advanced Directives 10/14/2017 04/27/2017 04/16/2017 09/11/2016 08/25/2016 08/21/2016 06/05/2015  Does Patient Have a Medical Advance Directive? Yes Yes Yes Yes Yes Yes Yes  Type of Paramedic of [place];Living will [place];Living will [place];Living will [place];Living will [place];Living will Healthcare Power of [place];Living will  Does patient want to make changes to medical advance directive? - No - Patient declined No - Patient declined No - Patient declined No - Patient declined No - Patient declined No - Patient declined  Copy of [place] in Chart? No - copy requested No - copy requested No - copy requested No - copy requested No - copy requested No - copy requested No - copy requested  Would patient like information on creating a medical advance directive? - - - - - - -  Pre-existing out of facility DNR order (yellow form or pink MOST form) - - - - - - -    Tobacco Social History   Tobacco Use  Smoking Status Never Smoker  Smokeless Tobacco Never Used     Counseling given: No   Clinical Intake:  Pre-visit preparation completed: Yes  Pain : No/denies pain Pain Score: 0-No pain     Nutritional Status: BMI 25 -29 Overweight Diabetes: No  How often do you need to  have someone help you when you read instructions, pamphlets, or other written materials from your doctor or pharmacy?: 1 - Never What is the last grade level you completed in school?: 12th grade  Interpreter Needed?: No  Comments: pt lives with spouse Information entered by :: LPinson, LPN  Past Medical History:  Diagnosis Date  . Arthritis    hands   . Asthma   . Cancer ([place])    SKIN CANCER REMOVED  . Chronic atrial fibrillation (HCC)    a.fib on warfarin- Dr. Percival Spanish follows  . Diverticulosis of colon (without mention of hemorrhage)   . Dysrhythmia    a fib  . ED (erectile dysfunction)   . Fibromyalgia   . GERD (gastroesophageal reflux disease)   . Hard of hearing    wears hearing aids  . Headache   . History of blood clots    behind right knee and then went into left lung 28yrs ago  . History of colon polyps   . History of kidney stones   . Hx of diabetes mellitus    "no longer diabetic since gastric bypass" - no longer taking metformin"  in 2 months "problems with low blood sugar now"  . Hx of gout    but doesn't take any meds  . Inflammation of colonic mucosa    recent admission and [facility] from [facility]15-remains on oral antibiotic  . Obesity   . Peripheral vascular disease ([place])   . Pneumonia  last time about 47yrs ago  . Pulmonary embolism ([place])    over 10 yrs ago "many years ago"  . Restless legs syndrome (RLS)   . Unspecified asthma(493.90)    as a child  . Unspecified essential hypertension    hx of no longer on medication due to gastric bypass    Past Surgical History:  Procedure Laterality Date  . ANTERIOR CERVICAL DECOMP/DISCECTOMY FUSION N/A 05/09/2013   Procedure: ANTERIOR CERVICAL DECOMPRESSION/DISCECTOMY FUSION CERVICAL THREE-FOUR,CERVICAL FOUR-FIVE,CERVICAL FIVE-SIX;  Surgeon: Floyce Stakes, MD;  Location: [facility];  Service: Neurosurgery;  Laterality: N/A;  . ARTERY REPAIR     Left forearm  . BACK SURGERY     x 3  . BREATH TEK H  PYLORI  01/08/2011   Procedure: BREATH TEK H PYLORI;  Surgeon: Pedro Earls, MD;  Location: [facility];  Service: General;  Laterality: N/A;  . CARDIOVERSION     x 2 attempts-unsuccessful.  [name] CATARACT EXTRACTION, BILATERAL    . COLONOSCOPY    . CYSTOSCOPY    . ESOPHAGOGASTRODUODENOSCOPY (EGD) WITH PROPOFOL N/A 09/21/2014   Procedure: ESOPHAGOGASTRODUODENOSCOPY (EGD) WITH PROPOFOL;  Surgeon: Manya Silvas, MD;  Location: [facility]COPY;  Service: Endoscopy;  Laterality: N/A;  . EYE SURGERY     cataract bil  . FOOT SURGERY     Left foot   . GASTRIC ROUX-EN-Y  08/11/2011   Procedure: LAPAROSCOPIC ROUX-EN-Y GASTRIC BYPASS WITH UPPER ENDOSCOPY;  Surgeon: Pedro Earls, MD;  Location: [facility]S;  Service: General;  Laterality: N/A;  . HAND SURGERY     LEFT  . injections in back     x 18  . JOINT REPLACEMENT    . KNEE SURGERY Left    x 4  . KNEE SURGERY Left    arthroscopy  . LAPAROSCOPIC INTERNAL HERNIA REPAIR N/A 03/08/2015   Procedure: LAPAROSCOPIC INTERNAL HERNIA REPAIR ;  Surgeon: [name], MD;  Location: [facility];  Service: General;  Laterality: N/A;  . LEG SURGERY     FOR NECROTIZING FASCITIS L LEG AND GROIN  . PANNICULECTOMY N/A 08/25/2016   Procedure: PANNICULECTOMY;  Surgeon: [name], MD;  Location: [facility];  Service: General;  Laterality: N/A;  . PENILE PROSTHESIS IMPLANT N/A 07/02/2014   Procedure: PENILE PROTHESIS INFLATABLE 3 PIECE (COLOPLAST) SCROTAL APPROACH;  Surgeon: Kathie Rhodes, MD;  Location: [facility];  Service: Urology;  Laterality: N/A;  . PENILE PROSTHESIS IMPLANT N/A 11/23/2014   Procedure: EXPLORATION AND REVISION OF PENILE PROSTHESIS;  Surgeon: Kathie Rhodes, MD;  Location: [facility];  Service: Urology;  Laterality: N/A;  . PICC INSERTION W/OUT PORT/PUMP    . REPLACEMENT TOTAL KNEE Left    x 2  . SHOULDER ARTHROSCOPY    . SHOULDER SURGERY Left 2014  . TONSILLECTOMY    . TOTAL KNEE ARTHROPLASTY Right 04/27/2017  . TOTAL KNEE ARTHROPLASTY Right  04/27/2017   Procedure: TOTAL KNEE ARTHROPLASTY;  Surgeon: Melrose Nakayama, MD;  Location: [place];  Service: Orthopedics;  Laterality: Right;  . TOTAL SHOULDER ARTHROPLASTY Left 01/26/2013   Procedure: TOTAL SHOULDER ARTHROPLASTY;  Surgeon: Nita Sells, MD;  Location: [facility];  Service: Orthopedics;  Laterality: Left;  Left total shoulder arthroplasty   Family History  Problem Relation Age of Onset  . Uterine cancer Mother        mets  . Breast cancer Mother   . Cancer Mother        cervical  . Emphysema Father   . Alzheimer's disease Sister   .  Prostate cancer Brother   . Heart attack Brother   . Colon cancer Brother 48   Social History   Socioeconomic History  . Marital status: Married    Spouse name: Not on file  . Number of children: 1  . Years of education: Not on file  . Highest education level: Not on file  Occupational History  . Occupation: Retired    Comment: Investment banker, operational  . Financial resource strain: Not on file  . Food insecurity:    Worry: Not on file    Inability: Not on file  . Transportation needs:    Medical: Not on file    Non-medical: Not on file  Tobacco Use  . Smoking status: Never Smoker  . Smokeless tobacco: Never Used  Substance and Sexual Activity  . Alcohol use: No    Alcohol/week: 0.0 standard drinks  . Drug use: No  . Sexual activity: Yes  Lifestyle  . Physical activity:    Days per week: Not on file    Minutes per session: Not on file  . Stress: Not on file  Relationships  . Social connections:    Talks on phone: Not on file    Gets together: Not on file    Attends religious service: Not on file    Active member of club or organization: Not on file    Attends meetings of clubs or organizations: Not on file    Relationship status: Not on file  Other Topics Concern  . Not on file  Social History Narrative   DIET: 3 meals, F&V, some water, crystal light, No fast food   Exercise: walks treadmill 30 min       1 Caffeine drinks daily       No living will.           Outpatient Encounter Medications as of 10/14/2017  Medication Sig  . atorvastatin (LIPITOR) 80 MG tablet Take 1 tablet (80 mg total) by mouth at bedtime.  [name] FLUoxetine HCl 60 MG TABS 1 tab po at bedtime  . furosemide (LASIX) 20 MG tablet Take 1 tablet (20 mg total) by mouth every other day.  . gabapentin (NEURONTIN) 300 MG capsule TAKE 1 CAPSULE BY MOUTH TWICE A DAY  . hydrocortisone 2.5 % cream APPLICATIONS APPLY ON THE SKIN APPLY ON THE SKIN TWICE A DAY AS NEEDED  . lisinopril (PRINIVIL,ZESTRIL) 2.5 MG tablet Take 2.5 mg by mouth daily.  [name] omeprazole (PRILOSEC) 40 MG capsule Take 40 mg by mouth daily.  . primidone (MYSOLINE) 50 MG tablet 150 mg in the morning, 100 mg at night  . silodosin (RAPAFLO) 8 MG CAPS capsule Take by mouth.  . warfarin (COUMADIN) 2.5 MG tablet TAKE 1 TO 1 AND 1/2 TABLETS DAILY AS DIRECTED BY COUMADIN CLINIC  . zolpidem (AMBIEN CR) 12.5 MG CR tablet TAKE 1 TABLET (12.5 MG TOTAL) BY MOUTH AT BEDTIME AS NEEDED FOR SLEEP.  . [DISCONTINUED] acarbose (PRECOSE) 50 MG tablet Take 50 mg by mouth 3 (three) times daily.  . [DISCONTINUED] acetaminophen-codeine (TYLENOL #3) 300-30 MG tablet   . [DISCONTINUED] bisacodyl (DULCOLAX) 5 MG EC tablet Take 1 tablet (5 mg total) by mouth daily as needed for moderate constipation.  . [DISCONTINUED] docusate sodium (COLACE) 100 MG capsule Take 1 capsule (100 mg total) by mouth 2 (two) times daily.  . [DISCONTINUED] erythromycin ophthalmic ointment APPLY TO RIGHT EYE 4 TIMES A DAY  . [DISCONTINUED] HYDROcodone-acetaminophen (NORCO/VICODIN) 5-325 MG tablet Take 1-2 tablets  by mouth every 6 (six) hours as needed.  . [DISCONTINUED] L-Methylfolate-Algae-B12-B6 (METANX) 3-90.314-2-35 MG CAPS Take 1 capsule by mouth 2 (two) times daily.  . [DISCONTINUED] lansoprazole (PREVACID) 30 MG capsule TAKE 1 CAPSULE (30 MG TOTAL) BY MOUTH ONCE DAILY.  . [DISCONTINUED] lidocaine (XYLOCAINE) 5 %  ointment APPLY 1 GRAM ( 1 GRAM = 1 INCH) TO THE AFFECTED AREA 2 TIMES A DAY  . [DISCONTINUED] LORazepam (ATIVAN) 1 MG tablet Take 0.5 tablets (0.5 mg total) by mouth 2 (two) times daily as needed for anxiety.  . [DISCONTINUED] nystatin cream (MYCOSTATIN) APPLY TO SKIN TWICE DAILY AS DIRECTED AS NEEDED  . [DISCONTINUED] oxyCODONE (OXY IR/ROXICODONE) 5 MG immediate release tablet TAKE 1 TABLET BY MOUTH EVERY 8-12 HOURS AS NEEDED FOR PAIN  . [DISCONTINUED] ranitidine (ZANTAC) 300 MG tablet Take 300 mg by mouth at bedtime.   . [DISCONTINUED] sucralfate (CARAFATE) 1 g tablet Take 1 g by mouth daily before breakfast.  . [DISCONTINUED] tamsulosin (FLOMAX) 0.4 MG CAPS capsule Take 0.4 mg by mouth daily.   . [DISCONTINUED] tiZANidine (ZANAFLEX) 4 MG tablet Take 1 tablet (4 mg total) by mouth every 6 (six) hours as needed.  . [DISCONTINUED] Transparent Dressings (NEXCARE TEGADERM 2-3/8"X2-3/4") MISC APPLY TO AREA OF PRESSURE ULCER EVERY OTHER DAY  . [DISCONTINUED] warfarin (COUMADIN) 2.5 MG tablet TAKE 1 TO 1.5 TABLETS BY  MOUTH DAILY  AS DIRECTED  BY COUMADIN CLINIC  . [DISCONTINUED] XIIDRA 5 % SOLN INSTILL 1 DROPS INTO BOTH EYES TWICE A DAY   No facility-administered encounter medications on file as of 10/14/2017.     Activities of Daily Living In your present state of health, do you have any difficulty performing the following activities: 10/14/2017 06/10/2017  Hearing? Y Manuel Garcia? N N  Difficulty concentrating or making decisions? Y N  Walking or climbing stairs? N Y  Dressing or bathing? N N  Doing errands, shopping? N -  Preparing Food and eating ? N -  Using the Toilet? N -  In the past six months, have you accidently leaked urine? N -  Do you have problems with loss of bowel control? N -  Managing your Medications? N -  Managing your Finances? N -  Housekeeping or managing your Housekeeping? N -  Some recent data might be hidden    Patient Care Team: Jinny Sanders, MD as PCP  - General Minus Breeding, MD as PCP - Cardiology (Cardiology) Himmelrich, Bryson Ha, RD (Inactive) as Dietitian (Bariatrics) Melida Quitter, MD as Consulting Physician (Otolaryngology) Delrae Rend, MD as Consulting Physician (Endocrinology) Kayleen Memos, PA-C as Physician Assistant (Gastroenterology) Brand Males, MD as Consulting Physician (Pulmonary Disease) Malena Catholic, MD as Referring Physician Leeroy Cha, MD as Consulting Physician (Neurosurgery) Tat, Eustace Quail, DO as Consulting Physician (Neurology) Hollice Espy, MD as Consulting Physician (Urology) Troxler, [place]man [place], [place] as Attending Physician (Podiatry) Jola Schmidt, MD as Consulting Physician (Ophthalmology) Melrose Nakayama, MD as Consulting Physician (Orthopedic Surgery)   Assessment:   This is a routine wellness examination for [place].  Hearing Screening Comments: Bilateral hearing aids Vision Screening Comments: Vision exam every 6 months   Exercise Activities and Dietary recommendations Current Exercise Habits: Home exercise routine, Type of exercise: treadmill, Time (Minutes): 20(15-25 minutes), Frequency (Times/Week): 7, Weekly Exercise (Minutes/Week): 140, Intensity: Moderate, Exercise limited by: None identified  Goals    . Increase physical activity     Starting 10/14/2017, I will continue to walk on treadmill 15-25 minutes daily.  Fall Risk Fall Risk  10/14/2017 12/22/2016 10/30/2016 09/11/2016 05/19/2016  Falls in the past year? Yes Yes Yes No No  Comment foot became entangled in bed cover; bruising to ribs - - - -  Number falls in past yr: 1 2 or more - - -  Injury with Fall? Yes No - - -  Follow up - - - - -   Depression Screen PHQ 2/9 Scores 10/14/2017 07/02/2017 10/30/2016 09/11/2016  PHQ - 2 Score 0 6 0 0  PHQ- 9 Score 0 21 - -    Cognitive Function MMSE - Mini Mental State Exam 10/14/2017 09/11/2016 06/05/2015  Orientation to time 5 5 5   Orientation to Place 5 5 5   Registration 3 3 3     Attention/ Calculation 0 0 0  Recall 3 3 3   Language- name 2 objects 0 0 0  Language- repeat 1 1 1   Language- follow 3 step command 2 3 3   Language- follow 3 step command-comments unable to follow 1 step of 3 step command - -  Language- read & follow direction 0 0 0  Write a sentence 0 - 0  Copy design 0 0 0  Total score 19 - 20   PLEASE NOTE: A Mini-Cog screen was completed. Maximum score is 20. A value of 0 denotes this part of [place] MMSE was not completed or the patient failed this part of the Mini-Cog screening.   Mini-Cog Screening Orientation to Time - Max 5 pts Orientation to Place - Max 5 pts Registration - Max 3 pts Recall - Max 3 pts Language Repeat - Max 1 pts Language Follow 3 Step Command - Max 3 pts     Immunization History  Administered Date(s) Administered  . Influenza Whole 11/09/2005, 11/09/2008, 11/10/2010  . Influenza, High Dose Seasonal PF 12/08/2013, 11/02/2015, 11/09/2016  . Influenza-Unspecified 11/29/2014  . Pneumococcal Conjugate-13 12/22/2013  . Pneumococcal Polysaccharide-23 02/10/2003, 11/09/2008, 11/02/2015  . Td 02/09/2005  . Tdap 06/06/2015  . Zoster 06/21/2015    Screening Tests Health Maintenance  Topic Date Due  . INFLUENZA VACCINE  05/11/2018 (Originally 09/09/2017)  . COLONOSCOPY  02/09/2019 (Originally 07/10/2016)  . FOOT EXAM  04/10/2018  . HEMOGLOBIN A1C  04/14/2018  . OPHTHALMOLOGY EXAM  09/10/2018  . URINE MICROALBUMIN  09/10/2018  . TETANUS/TDAP  06/05/2025  . PNA vac Low Risk Adult  Completed     Plan:     I have personally reviewed, addressed, and noted the following in the patient's chart:  A. Medical and social history B. Use of alcohol, tobacco or illicit drugs  C. Current medications and supplements D. Functional ability and status E.  Nutritional status F.  Physical activity G. Advance directives H. List of other physicians I.  Hospitalizations, surgeries, and ER visits in previous 12 months J.   August to include hearing, vision, cognitive, depression L. Referrals and appointments - none  In addition, I have reviewed and discussed with patient certain preventive protocols, quality metrics, and best practice recommendations. A written personalized care plan for preventive services as well as general preventive health recommendations were provided to patient.  See attached scanned questionnaire for additional information.   Signed,   Lindell Noe, MHA, BS, LPN Health Coach

## 2017-10-14 NOTE — Telephone Encounter (Signed)
Labs completed

## 2017-10-14 NOTE — Assessment & Plan Note (Signed)
Stable rate controlled followed by Cards. On coumadin

## 2017-10-14 NOTE — Assessment & Plan Note (Signed)
Stable on current regimen. reocmmended flu vaccine.[name] He will return for this.

## 2017-10-15 DIAGNOSIS — R262 Difficulty in walking, not elsewhere classified: Secondary | ICD-10-CM | POA: Diagnosis not present

## 2017-10-15 DIAGNOSIS — M25561 Pain in right knee: Secondary | ICD-10-CM | POA: Diagnosis not present

## 2017-10-15 DIAGNOSIS — M25661 Stiffness of right knee, not elsewhere classified: Secondary | ICD-10-CM | POA: Diagnosis not present

## 2017-10-15 NOTE — Progress Notes (Signed)
I reviewed health advisor's note, was available for consultation, and agree with documentation and plan.  

## 2017-10-18 DIAGNOSIS — E1142 Type 2 diabetes mellitus with diabetic polyneuropathy: Secondary | ICD-10-CM | POA: Diagnosis not present

## 2017-10-18 DIAGNOSIS — Z79899 Other long term (current) drug therapy: Secondary | ICD-10-CM | POA: Diagnosis not present

## 2017-10-18 DIAGNOSIS — Z9884 Bariatric surgery status: Secondary | ICD-10-CM | POA: Diagnosis not present

## 2017-10-21 ENCOUNTER — Ambulatory Visit (INDEPENDENT_AMBULATORY_CARE_PROVIDER_SITE_OTHER): Payer: Medicare Other

## 2017-10-21 DIAGNOSIS — Z7901 Long term (current) use of anticoagulants: Secondary | ICD-10-CM

## 2017-10-21 DIAGNOSIS — Z5181 Encounter for therapeutic drug level monitoring: Secondary | ICD-10-CM

## 2017-10-21 DIAGNOSIS — I4891 Unspecified atrial fibrillation: Secondary | ICD-10-CM

## 2017-10-21 DIAGNOSIS — I824Y9 Acute embolism and thrombosis of unspecified deep veins of unspecified proximal lower extremity: Secondary | ICD-10-CM

## 2017-10-21 LAB — POCT INR: INR: 2.8 (ref 2.0–3.0)

## 2017-10-21 NOTE — Patient Instructions (Signed)
Description   Continue on same dosage 1.5 tablets everyday except 1 tablet on Thursdays.  Recheck in 4 weeks. Call if you start any new medications [phone].

## 2017-10-22 ENCOUNTER — Other Ambulatory Visit: Payer: Self-pay | Admitting: Family Medicine

## 2017-10-22 NOTE — Telephone Encounter (Signed)
Electronic refill request Refill request for Fluoxetine 20 mg does not match medication list which shows  Fluoxetine 60 mg once daily Please confirm correct dose and directions on script Last office visit 10/14/17

## 2017-10-22 NOTE — Telephone Encounter (Signed)
Please call pt and verify dose he is taking. I believe it is 60 mg daily.

## 2017-10-22 NOTE — Telephone Encounter (Signed)
Yes, it is 60 mg per day.

## 2017-10-24 ENCOUNTER — Other Ambulatory Visit: Payer: Self-pay | Admitting: Family Medicine

## 2017-10-25 ENCOUNTER — Encounter (HOSPITAL_COMMUNITY)
Admission: RE | Admit: 2017-10-25 | Discharge: 2017-10-25 | Disposition: A | Discharge status: Home / Self Care | Payer: Medicare Other | Source: Ambulatory Visit | Attending: Urology | Admitting: Urology

## 2017-10-25 ENCOUNTER — Telehealth: Payer: Self-pay | Admitting: Family Medicine

## 2017-10-25 DIAGNOSIS — C61 Malignant neoplasm of prostate: Secondary | ICD-10-CM | POA: Diagnosis not present

## 2017-10-25 MED ORDER — TECHNETIUM TC 99M MEDRONATE IV KIT
19.8000 | PACK | Freq: Once | INTRAVENOUS | Status: AC | PRN
  Administered 2017-10-25: 19.8 via INTRAVENOUS

## 2017-10-25 NOTE — Telephone Encounter (Signed)
Copied from [place] [phone]. Topic: Quick Communication - See Telephone Encounter >> Oct 25, 2017  4:01 PM Blase Mess A wrote: CRM for notification. See Telephone encounter for: 10/25/17.  Patients wife is calling because he has had the [place] vaccine 1 and is asking if he needed the prevaner 2 injeciton please advise [phone]

## 2017-10-25 NOTE — Telephone Encounter (Signed)
Per pts immunization report pt had prevnar 13 on 12/22/13.

## 2017-10-25 NOTE — Telephone Encounter (Signed)
Electronic refill request Last refill 09/28/17 #30 Last office visit 10/14/17

## 2017-10-26 NOTE — Telephone Encounter (Signed)
Mr. Gordillo notified by telephone that he is up to date on pneumonia vaccines.

## 2017-10-27 ENCOUNTER — Telehealth: Payer: Self-pay | Admitting: Neurology

## 2017-10-27 DIAGNOSIS — M9901 Segmental and somatic dysfunction of cervical region: Secondary | ICD-10-CM | POA: Diagnosis not present

## 2017-10-27 DIAGNOSIS — M50322 Other cervical disc degeneration at C5-C6 level: Secondary | ICD-10-CM | POA: Diagnosis not present

## 2017-10-27 DIAGNOSIS — Z96653 Presence of artificial knee joint, bilateral: Secondary | ICD-10-CM | POA: Diagnosis not present

## 2017-10-27 DIAGNOSIS — M25561 Pain in right knee: Secondary | ICD-10-CM | POA: Diagnosis not present

## 2017-10-27 NOTE — Telephone Encounter (Signed)
Would you call the patient and find out : 1. Did botox help?  If not, no need to keep doing it.  2.  They never proceeded with ambulatory EEG or made a f/u with me which is okay but I cannot continue to do his follow ups on botox days, as we did last visit.  Cardiology did address his symptoms and looks like they decreased lasix and seemed to help near syncope.

## 2017-10-28 NOTE — Telephone Encounter (Signed)
We probably can increase botox a little but if wants to try one of newer CGRP meds, will ask for consult from Dr Tomi Likens

## 2017-10-28 NOTE — Telephone Encounter (Signed)
He wants to try increasing dose of Botox and if that doesn't work he would like to see Dr. Tomi Likens to discuss other options.

## 2017-10-28 NOTE — Telephone Encounter (Signed)
Spoke with patient. He states Botox is helpful, but only for a month. He didn't know if dose could be increased or if there was another option for treatment. Please advise.  He states syncope issue is resolved and didn't feel the need to proceed with EEG.

## 2017-10-29 ENCOUNTER — Ambulatory Visit: Payer: Medicare Other | Admitting: Neurology

## 2017-11-02 ENCOUNTER — Ambulatory Visit: Payer: Medicare Other | Admitting: Family Medicine

## 2017-11-05 DIAGNOSIS — C61 Malignant neoplasm of prostate: Secondary | ICD-10-CM | POA: Diagnosis not present

## 2017-11-08 ENCOUNTER — Ambulatory Visit (INDEPENDENT_AMBULATORY_CARE_PROVIDER_SITE_OTHER): Payer: Medicare Other | Admitting: Neurology

## 2017-11-08 DIAGNOSIS — M9901 Segmental and somatic dysfunction of cervical region: Secondary | ICD-10-CM | POA: Diagnosis not present

## 2017-11-08 DIAGNOSIS — G43709 Chronic migraine without aura, not intractable, without status migrainosus: Secondary | ICD-10-CM | POA: Diagnosis not present

## 2017-11-08 DIAGNOSIS — M50322 Other cervical disc degeneration at C5-C6 level: Secondary | ICD-10-CM | POA: Diagnosis not present

## 2017-11-08 DIAGNOSIS — IMO0002 Reserved for concepts with insufficient information to code with codable children: Secondary | ICD-10-CM

## 2017-11-08 MED ORDER — ONABOTULINUMTOXINA 100 UNITS IJ SOLR
100.0000 [IU] | Freq: Once | INTRAMUSCULAR | Status: AC
  Administered 2017-11-08: 100 [IU] via INTRAMUSCULAR

## 2017-11-08 NOTE — Procedures (Signed)
[facility]   History:  Diagnosis: Migraine, chronic  Initial side: bilateral   Results:  Headaches are better in severity and frequency but does seem to get more after 6 weeks.      Consent obtained from: The patient Benefits discussed included, but were not limited to decreased muscle tightness, increased joint range of motion, and decreased pain.  Risk discussed included, but were not limited pain and discomfort, bleeding, bruising, excessive weakness, venous thrombosis, muscle atrophy and dysphagia.  A copy of the patient medication guide was given to the patient which explains the blackbox warning.  Patients identity and treatment sites confirmed Yes.  .  Details of Procedure: Skin was cleaned with alcohol.  A 30 gauge, 1/2 inch needle was used.  Prior to injection, the needle plunger was aspirated to make sure the needle was not within a blood vessel.  There was no blood retrieved on aspiration.    Following is a summary of the muscles injected  And the amount of Botulinum toxin used:  Injections  Location Left  Right Units Number of sites        Corrugator 5.0 5.0 10 1 each  Frontalis 5.0/5.0 5.0/5.0 20 2 each  Temporalis 5.0/5.0 5.0/5.0 20.0 2 each  Cervical paraspinal, posterior 5.0/5.0 5.0/5.0 20 2 each  occiput 5.0/5.0 5.0/5.0 20.0 2 each  trapezius  5.0 5.0   Procerus   5.0 1  TOTAL UNITS:   100     Agent: Botulinum Type A ( Onobotulinum Toxin type A ).  1 vials of Botox were used, each containing 50 units and freshly diluted with 2 mL of sterile, non-perserved saline   Total injected (Units): 100  Total wasted (Units): 0 Pt tolerated procedure well without complications.   Reinjection is anticipated in 3 months.

## 2017-11-10 ENCOUNTER — Other Ambulatory Visit: Payer: Self-pay | Admitting: Family Medicine

## 2017-11-16 ENCOUNTER — Other Ambulatory Visit: Payer: Self-pay | Admitting: *Deleted

## 2017-11-16 MED ORDER — WARFARIN SODIUM 2.5 MG PO TABS: ORAL_TABLET | ORAL | 1 refills | Status: DC

## 2017-11-19 ENCOUNTER — Ambulatory Visit (INDEPENDENT_AMBULATORY_CARE_PROVIDER_SITE_OTHER): Payer: Medicare Other

## 2017-11-19 DIAGNOSIS — Z5181 Encounter for therapeutic drug level monitoring: Secondary | ICD-10-CM | POA: Diagnosis not present

## 2017-11-19 DIAGNOSIS — I824Y9 Acute embolism and thrombosis of unspecified deep veins of unspecified proximal lower extremity: Secondary | ICD-10-CM | POA: Diagnosis not present

## 2017-11-19 DIAGNOSIS — I4891 Unspecified atrial fibrillation: Secondary | ICD-10-CM

## 2017-11-19 DIAGNOSIS — Z7901 Long term (current) use of anticoagulants: Secondary | ICD-10-CM | POA: Diagnosis not present

## 2017-11-19 LAB — POCT INR: INR: 4.3 — AB (ref 2.0–3.0)

## 2017-11-19 NOTE — Patient Instructions (Signed)
Description   Skip today's dosage of Coumadin, then start taking 1.5 tablets everyday except 1 tablet on Sundays and Thursdays.  Recheck in 2 weeks. Call if you start any new medications [phone].

## 2017-11-21 ENCOUNTER — Other Ambulatory Visit: Payer: Self-pay | Admitting: Family Medicine

## 2017-11-21 NOTE — Telephone Encounter (Signed)
Last office visit 10/14/17 for CPE. Last refilled 10/26/17 for #30 with no refills. Next appt: 10/28/2018 for CPE.

## 2017-11-25 ENCOUNTER — Encounter: Payer: Self-pay | Admitting: Radiation Oncology

## 2017-11-25 ENCOUNTER — Ambulatory Visit
Admission: RE | Admit: 2017-11-25 | Discharge: 2017-11-25 | Disposition: A | Discharge status: Home / Self Care | Payer: Medicare Other | Source: Ambulatory Visit | Attending: Radiation Oncology | Admitting: Radiation Oncology

## 2017-11-25 ENCOUNTER — Other Ambulatory Visit: Payer: Self-pay

## 2017-11-25 VITALS — BP 132/95 | HR 98 | Temp 97.7°F | Resp 20 | Ht 72.0 in | Wt 194.8 lb

## 2017-11-25 DIAGNOSIS — Z79899 Other long term (current) drug therapy: Secondary | ICD-10-CM | POA: Insufficient documentation

## 2017-11-25 DIAGNOSIS — C61 Malignant neoplasm of prostate: Secondary | ICD-10-CM | POA: Insufficient documentation

## 2017-11-25 DIAGNOSIS — Z9689 Presence of other specified functional implants: Secondary | ICD-10-CM | POA: Diagnosis not present

## 2017-11-25 DIAGNOSIS — N401 Enlarged prostate with lower urinary tract symptoms: Secondary | ICD-10-CM | POA: Diagnosis not present

## 2017-11-25 DIAGNOSIS — R972 Elevated prostate specific antigen [PSA]: Secondary | ICD-10-CM | POA: Diagnosis not present

## 2017-11-25 HISTORY — DX: Malignant neoplasm of prostate: C61

## 2017-11-25 HISTORY — DX: Unspecified malignant neoplasm of skin, unspecified: C44.90

## 2017-11-25 NOTE — Progress Notes (Signed)
GU Location of Tumor / Histology: prostatic adenocarcinoma  If Prostate Cancer, Gleason Score is (4 + 3) and PSA is (5.58). Prostate volume: 17.9 g.  [name] reports he began experiencing a weak urine stream. Dr. Alinda Money found the patient had an enlarged prostate and an elevated PSA.  Biopsies of prostate (if applicable) revealed:    Past/Anticipated interventions by urology, if any: prostate biopsy, CT abd/pelvis (negative), bone scan (negative), referral to radiation oncology, preparing for ADT and fiducial marker placement  Past/Anticipated interventions by medical oncology, if any: no  Weight changes, if any: no   Bowel/Bladder complaints, if any: Reports nocturia and weak stream. Taking Rapaflo. Patient reports ATFTDDU doesn't help much. Denies dysuria, hematuria, urinary leakage or incontinence. IPSS 18. SHIM 25.  Nausea/Vomiting, if any: no  Pain issues, if any:  no  SAFETY ISSUES:  Prior radiation? no  Pacemaker/ICD? Afib. No pacemaker.   Possible current pregnancy? no  Is the patient on methotrexate? no  Current Complaints / other details:  77 year old male. Married with one daughter. Resides in [place].

## 2017-11-25 NOTE — Progress Notes (Signed)
See progress note under physician encounter. 

## 2017-11-25 NOTE — Progress Notes (Signed)
Radiation Oncology         [phone] ________________________________  Initial Outpatient Consultation  Name: [name] MRN: [id number]  Date: 11/25/2017  DOB: October 26, 1940  [id number], Jonathan Gay, MD  Jinny Sanders, MD   REFERRING PHYSICIAN: Jinny Sanders, MD  DIAGNOSIS: 77 y.o. gentleman with Stage T3a adenocarcinoma of the prostate with Gleason score of 4+3, and PSA of 5.58.    ICD-10-CM   1. Prostate cancer [facility]) [place] [facility] referral to Social Work    HISTORY OF PRESENT ILLNESS: [name] is a 77 y.o. male with a diagnosis of prostate cancer. He has recently had progression of lower urinary tract symptoms related to his longstanding history of BPH- despite treatment with Rapaflo.  Accordingly, he was referred for evaluation in urology by Dr. Alinda Money on 09/21/17, where a digital rectal examination was performed at that time revealing the majority of the right lobe significantly indurated with concerns for potential extension toward his right seminal vesicle and extraprostatic tissue. His PSA was checked at that time, and he was noted to have an elevated PSA of 5.58.  Prior PSA in 09/2013 was 2.7.  The patient proceeded to transrectal ultrasound with 12 biopsies of the prostate on 10/07/17.  The prostate volume measured 17.9 cc.  Out of 12 core biopsies, 7 were positive.  The maximum Gleason score was 4+3, and this was seen in the right base, right mid, right base lateral, right mid lateral, and right apex lateral. Gleason 3+4 was seen in the the left base and right apex. Perineural invasion was identified in 3 cores on the right.  Biopsies of prostate revealed:    Staging studies include CT abdomen/pelvis and bone scans perforned on 10/25/17 which were both negative for metastatic disease.  The patient reviewed the biopsy results with his urologist and he has kindly been referred today for discussion of potential radiation treatment options.  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 He also has an implanted penile prosthesis (placed in 2016 by Dr. Karsten Ro) for management of his ED.  PREVIOUS RADIATION THERAPY: No  PAST MEDICAL HISTORY:  Past Medical History:  Diagnosis Date  . Arthritis    hands   . Asthma   . Chronic atrial fibrillation    a.fib on warfarin- Dr. Percival Spanish follows  . Diverticulosis of colon (without mention of hemorrhage)   . Dysrhythmia    a fib  . ED (erectile dysfunction)   . Fibromyalgia   . GERD (gastroesophageal reflux disease)   . Hard of hearing    wears hearing aids  . Headache   . History of blood clots    behind right knee and then went into left lung 67yrs ago  . History of colon polyps   . History of kidney stones   . Hx of diabetes mellitus    "no longer diabetic since gastric bypass" - no longer taking metformin"  in 2 months "problems with low blood sugar now"  . Hx of gout    but doesn't take any meds  . Inflammation of colonic mucosa    recent admission and release from [facility] 02-28-15-remains on oral antibiotic  . Obesity   . Peripheral vascular disease ([place])   . Pneumonia    last time about 69yrs ago  . Prostate cancer ([place])   . Pulmonary embolism ([place])  over 10 yrs ago "many years ago"  . Restless legs syndrome (RLS)   . Skin cancer   . Unspecified asthma(493.90)    as a child  . Unspecified essential hypertension    hx of no longer on medication due to gastric bypass       PAST SURGICAL HISTORY: Past Surgical History:  Procedure Laterality Date  . ANTERIOR CERVICAL DECOMP/DISCECTOMY FUSION N/A 05/09/2013   Procedure: ANTERIOR CERVICAL DECOMPRESSION/DISCECTOMY FUSION CERVICAL THREE-FOUR,CERVICAL FOUR-FIVE,CERVICAL FIVE-SIX;  Surgeon: Floyce Stakes, MD;  Location: [facility];  Service: Neurosurgery;  Laterality: N/A;  . ARTERY REPAIR     Left forearm  . BACK  SURGERY     x 3  . BREATH TEK H PYLORI  01/08/2011   Procedure: BREATH TEK H PYLORI;  Surgeon: Pedro Earls, MD;  Location: [facility];  Service: General;  Laterality: N/A;  . CARDIOVERSION     x 2 attempts-unsuccessful.  [name] CATARACT EXTRACTION, BILATERAL    . COLONOSCOPY    . CYSTOSCOPY    . ESOPHAGOGASTRODUODENOSCOPY (EGD) WITH PROPOFOL N/A 09/21/2014   Procedure: ESOPHAGOGASTRODUODENOSCOPY (EGD) WITH PROPOFOL;  Surgeon: Manya Silvas, MD;  Location: [facility];  Service: Endoscopy;  Laterality: N/A;  . EYE SURGERY     cataract bil  . FOOT SURGERY     Left foot   . GASTRIC ROUX-EN-Y  08/11/2011   Procedure: LAPAROSCOPIC ROUX-EN-Y GASTRIC BYPASS WITH UPPER ENDOSCOPY;  Surgeon: Pedro Earls, MD;  Location: [facility];  Service: General;  Laterality: N/A;  . HAND SURGERY     LEFT  . injections in back     x 18  . JOINT REPLACEMENT    . KNEE SURGERY Left    x 4  . KNEE SURGERY Left    arthroscopy  . LAPAROSCOPIC INTERNAL HERNIA REPAIR N/A 03/08/2015   Procedure: LAPAROSCOPIC INTERNAL HERNIA REPAIR ;  Surgeon: [name], MD;  Location: [facility];  Service: General;  Laterality: N/A;  . LEG SURGERY     FOR NECROTIZING FASCITIS L LEG AND GROIN  . PANNICULECTOMY N/A 08/25/2016   Procedure: PANNICULECTOMY;  Surgeon: [name], MD;  Location: [facility];  Service: General;  Laterality: N/A;  . PENILE PROSTHESIS IMPLANT N/A 07/02/2014   Procedure: PENILE PROTHESIS INFLATABLE 3 PIECE (COLOPLAST) SCROTAL APPROACH;  Surgeon: Kathie Rhodes, MD;  Location: [facility];  Service: Urology;  Laterality: N/A;  . PENILE PROSTHESIS IMPLANT N/A 11/23/2014   Procedure: EXPLORATION AND REVISION OF PENILE PROSTHESIS;  Surgeon: Kathie Rhodes, MD;  Location: [facility];  Service: Urology;  Laterality: N/A;  . PICC INSERTION W/OUT PORT/PUMP    . REPLACEMENT TOTAL KNEE Left    x 2  . SHOULDER ARTHROSCOPY    . SHOULDER SURGERY Left 2014  . TONSILLECTOMY    . TOTAL KNEE ARTHROPLASTY Right 04/27/2017  .  TOTAL KNEE ARTHROPLASTY Right 04/27/2017   Procedure: TOTAL KNEE ARTHROPLASTY;  Surgeon: Melrose Nakayama, MD;  Location: [place];  Service: Orthopedics;  Laterality: Right;  . TOTAL SHOULDER ARTHROPLASTY Left 01/26/2013   Procedure: TOTAL SHOULDER ARTHROPLASTY;  Surgeon: Nita Sells, MD;  Location: [place];  Service: Orthopedics;  Laterality: Left;  Left total shoulder arthroplasty    FAMILY HISTORY:  Family History  Problem Relation Age of Onset  . Uterine cancer Mother        mets  . Breast cancer Mother   . Cancer Mother        cervical  . Emphysema Father   . Alzheimer's  disease Sister   . Prostate cancer Brother   . Heart attack Brother   . Colon cancer Brother 77    SOCIAL HISTORY:  Social History   Socioeconomic History  . Marital status: Married    Spouse name: Not on file  . Number of children: 1  . Years of education: Not on file  . Highest education level: Not on file  Occupational History  . Occupation: Retired    Comment: Investment banker, operational  . Financial resource strain: Not on file  . Food insecurity:    Worry: Not on file    Inability: Not on file  . Transportation needs:    Medical: Not on file    Non-medical: Not on file  Tobacco Use  . Smoking status: Never Smoker  . Smokeless tobacco: Never Used  Substance and Sexual Activity  . Alcohol use: No    Alcohol/week: 0.0 standard drinks  . Drug use: No  . Sexual activity: Yes  Lifestyle  . Physical activity:    Days per week: Not on file    Minutes per session: Not on file  . Stress: Not on file  Relationships  . Social connections:    Talks on phone: Not on file    Gets together: Not on file    Attends religious service: Not on file    Active member of club or organization: Not on file    Attends meetings of clubs or organizations: Not on file    Relationship status: Not on file  . Intimate partner violence:    Fear of current or ex partner: Not on file    Emotionally abused:  Not on file    Physically abused: Not on file    Forced sexual activity: Not on file  Other Topics Concern  . Not on file  Social History Narrative   DIET: 3 meals, F&V, some water, crystal light, No fast food   Exercise: walks treadmill 30 min      1 Caffeine drinks daily       No living will.         Patient accompanied today by his wife and daughter. Resides in [place].  ALLERGIES: Sulfacetamide sodium; Latex; and Adhesive [tape]  MEDICATIONS:  Current Outpatient Medications  Medication Sig Dispense Refill  . atorvastatin (LIPITOR) 80 MG tablet Take 1 tablet (80 mg total) by mouth at bedtime. 90 tablet 3  . FLUoxetine (PROZAC) 20 MG capsule TAKE 3 CAPSULES BY MOUTH EVERYDAY AT BEDTIME 90 capsule 3  . furosemide (LASIX) 20 MG tablet Take 1 tablet (20 mg total) by mouth every other day. 180 tablet 1  . gabapentin (NEURONTIN) 300 MG capsule TAKE 1 CAPSULE BY MOUTH TWICE A DAY 180 capsule 1  . lisinopril (PRINIVIL,ZESTRIL) 2.5 MG tablet Take 2.5 mg by mouth daily.    [name] omeprazole (PRILOSEC) 40 MG capsule TAKE 1 CAPSULE (40 MG TOTAL) BY MOUTH DAILY. 90 capsule 1  . primidone (MYSOLINE) 50 MG tablet 150 mg in the morning, 100 mg at night 450 tablet 1  . silodosin (RAPAFLO) 8 MG CAPS capsule Take by mouth.    . warfarin (COUMADIN) 2.5 MG tablet TAKE 1 TO 1 AND 1/2 TABLETS DAILY AS DIRECTED BY COUMADIN CLINIC 135 tablet 1  . zolpidem (AMBIEN CR) 12.5 MG CR tablet TAKE 1 TABLET (12.5 MG TOTAL) BY MOUTH AT BEDTIME AS NEEDED FOR SLEEP. 30 tablet 0  . diclofenac sodium (VOLTAREN) 1 % GEL APPLY 2-3 GRAMS (1  GRAM=1 INCH) TO AFFECTED AREA 4 TIMES DAILY  12   No current facility-administered medications for this encounter.     REVIEW OF SYSTEMS:  On review of systems, the patient reports that he is doing well overall. He denies any chest pain, shortness of breath, cough, fevers, chills, night sweats, or unintended weight changes. He denies any bowel disturbances, and denies abdominal  pain, nausea or vomiting. He denies any new musculoskeletal or joint aches or pains. His IPSS was 18, indicating moderate urinary symptoms with nocturia and weak stream. He is taking Rapaflo but it does not help much. He denies dysuria, hematuria, leakage or incontinence. His SHIM was 25, indicating he does not have erectile dysfunction. A complete review of systems is obtained and is otherwise negative.  PHYSICAL EXAM:  Wt Readings from Last 3 Encounters:  11/25/17 194 lb 12.8 oz (88.4 kg)  10/14/17 190 lb 4 oz (86.3 kg)  10/14/17 190 lb 4 oz (86.3 kg)   Temp Readings from Last 3 Encounters:  11/25/17 97.7 F (36.5 C) (Oral)  10/14/17 97.9 F (36.6 C) (Oral)  10/14/17 97.9 F (36.6 C) (Oral)   BP Readings from Last 3 Encounters:  11/25/17 (!) 132/95  10/14/17 124/70  10/14/17 140/90   Pulse Readings from Last 3 Encounters:  11/25/17 98  10/14/17 76  10/14/17 80   Pain Assessment Pain Score: 0-No pain/10  In general this is a well appearing caucasian male in no acute distress. He is alert and oriented x4 and appropriate throughout the examination. HEENT reveals that the patient is normocephalic, atraumatic. EOMs are intact. PERRLA. Skin is intact without any evidence of gross lesions. Cardiovascular exam reveals a regular rate and rhythm, no clicks rubs or murmurs are auscultated. Chest is clear to auscultation bilaterally. Lymphatic assessment is performed and does not reveal any adenopathy in the cervical, supraclavicular, axillary, or inguinal chains. Abdomen has active bowel sounds in all quadrants and is intact. The abdomen is soft, non tender, non distended. Lower extremities are negative for pretibial pitting edema, deep calf tenderness, cyanosis or clubbing.  KPS = 90  100 - Normal; no complaints; no evidence of disease. 90   - Able to carry on normal activity; minor signs or symptoms of disease. 80   - Normal activity with effort; some signs or symptoms of disease. 17   -  Cares for self; unable to carry on normal activity or to do active work. 60   - Requires occasional assistance, but is able to care for most of his personal needs. 50   - Requires considerable assistance and frequent medical care. 88   - Disabled; requires special care and assistance. 40   - Severely disabled; hospital admission is indicated although death not imminent. 16   - Very sick; hospital admission necessary; active supportive treatment necessary. 10   - Moribund; fatal processes progressing rapidly. 0     - Dead  Karnofsky DA, Abelmann [place], Craver LS and [facility] [phone]) The use of the nitrogen mustards in the palliative treatment of carcinoma: with particular reference to bronchogenic carcinoma Cancer 1 634-56  LABORATORY DATA:  Lab Results  Component Value Date   WBC 5.1 06/17/2017   HGB 11.1 (L) 06/17/2017   HCT 32.6 (L) 06/17/2017   MCV 94.2 06/17/2017   PLT 235.0 06/17/2017   Lab Results  Component Value Date   NA 137 06/17/2017   K 4.7 06/17/2017   CL 105 06/17/2017   CO2 26 06/17/2017  Lab Results  Component Value Date   ALT 18 06/17/2017   AST 18 06/17/2017   ALKPHOS 73 06/17/2017   BILITOT 0.4 06/17/2017     RADIOGRAPHY: No results found.    IMPRESSION/PLAN: 1. 77 y.o. gentleman with Stage T3a adenocarcinoma of the prostate with Gleason Score of 4+3, and PSA of 5.58. We discussed the patient's workup and outlined the nature of prostate cancer in this setting. The patient's T stage, Gleason's score, and PSA put him into the unfavorable, intermediate risk group. Accordingly, he is eligible for a variety of potential treatment options including 5.5 weeks of external beam radiation in combination with ST-ADT. We discussed the available radiation techniques, and focused on the details and logistics and delivery. We discussed and outlined the risks, benefits, short and long-term effects associated with radiotherapy and compared and contrasted these with  prostatectomy. We discussed the role of SpaceOAR in reducing the rectal toxicity associated with radiotherapy. We also detailed the role of ADT in the treatment of unfavorable, intermediate risk prostate cancer and outlined the associated side effects that could be expected with this therapy.  At the conclusion of our conversation the patient is interested in moving forward with 5.5 weeks of external beam radiotherapy in combination with ST-ADT. We will contact [facility] to make arrangements for initiation of ADT and proceed with fiducial marker and SpaceOAR placement prior to CT SIM. He will be scheduled for CT simulation/radiation planning approximately 6-7 weeks after starting ADT in anticipation of beginning daily radiation treatments after 2 months of ADT. We will share our discussion with Dr. Alinda Money and move forward with scheduling his first Lupron injection and coordinating for fiducials/SpaceOAR gel.  We spent 60 minutes face to face with the patient and more than 50% of that time was spent in counseling and/or coordination of care.    Nicholos Johns, PA-C    Tyler Pita, MD  [facility] Direct Dial: [phone]  Fax: [phone] [email or web address]  Skype  [email or web address]  This document serves as a record of services personally performed by Tyler Pita, MD and Freeman Caldron, PA-C. It was created on their behalf by Rae Lips, a trained medical scribe. The creation of this record is based on the scribe's personal observations and the providers' statements to them. This document has been checked and approved by the attending providers.

## 2017-11-29 ENCOUNTER — Telehealth: Payer: Self-pay | Admitting: *Deleted

## 2017-11-29 DIAGNOSIS — L570 Actinic keratosis: Secondary | ICD-10-CM | POA: Diagnosis not present

## 2017-11-29 DIAGNOSIS — Z85828 Personal history of other malignant neoplasm of skin: Secondary | ICD-10-CM | POA: Diagnosis not present

## 2017-11-29 DIAGNOSIS — L821 Other seborrheic keratosis: Secondary | ICD-10-CM | POA: Diagnosis not present

## 2017-11-29 DIAGNOSIS — C61 Malignant neoplasm of prostate: Secondary | ICD-10-CM | POA: Insufficient documentation

## 2017-11-29 DIAGNOSIS — M9901 Segmental and somatic dysfunction of cervical region: Secondary | ICD-10-CM | POA: Diagnosis not present

## 2017-11-29 DIAGNOSIS — M5031 Other cervical disc degeneration,  high cervical region: Secondary | ICD-10-CM | POA: Diagnosis not present

## 2017-11-29 NOTE — Telephone Encounter (Signed)
CALLED PATIENT TO INFORM OF ADT INJ. ON 12-01-17 @ 1:30 PM @ [facility], LVM FOR A RETURN CALL

## 2017-12-01 DIAGNOSIS — Z5111 Encounter for antineoplastic chemotherapy: Secondary | ICD-10-CM | POA: Diagnosis not present

## 2017-12-01 DIAGNOSIS — C61 Malignant neoplasm of prostate: Secondary | ICD-10-CM | POA: Diagnosis not present

## 2017-12-02 ENCOUNTER — Telehealth: Payer: Self-pay | Admitting: Medical Oncology

## 2017-12-02 ENCOUNTER — Encounter: Payer: Self-pay | Admitting: General Practice

## 2017-12-02 NOTE — Progress Notes (Signed)
[place] Psychosocial Distress Screening Clinical Social Work  Clinical Social Work was referred by distress screening protocol.  The patient scored a 5 on the Psychosocial Distress Thermometer which indicates moderate distress. Clinical Social Worker contacted patient by phone to assess for distress and other psychosocial needs. Unable to reach patient, VM left on home number w brief description of [place] and services, as well as contact information so patient can call back if desired.    ONCBCN DISTRESS SCREENING 11/25/2017  Screening Type Initial Screening  Distress experienced in past week (1-10) 5  Emotional problem type Nervousness/Anxiety  Information Concerns Type Lack of info about diagnosis;Lack of info about treatment  Physician notified of physical symptoms Yes  Referral to clinical psychology No  Referral to clinical social work No  Referral to dietition No  Referral to financial advocate No  Referral to support programs No  Referral to palliative care No    Clinical Social Worker follow up needed: No.  Contact information left, patient encouraged to call back if needed.    If yes, follow up plan:  Beverely Pace, [place], LCSW Clinical Social Worker Phone:  [phone]

## 2017-12-02 NOTE — Telephone Encounter (Signed)
Left message to introduce myself as the prostate nurse navigator and my role. I was unable to meet him 10/17 when he consulted with Dr. Tammi Klippel. I asked him to call me back with questions or concerns and look forward to meet him in the future.

## 2017-12-03 ENCOUNTER — Ambulatory Visit (INDEPENDENT_AMBULATORY_CARE_PROVIDER_SITE_OTHER): Payer: Medicare Other | Admitting: Internal Medicine

## 2017-12-03 ENCOUNTER — Encounter: Payer: Self-pay | Admitting: Internal Medicine

## 2017-12-03 VITALS — BP 124/72 | HR 82 | Temp 98.4°F | Wt 195.0 lb

## 2017-12-03 DIAGNOSIS — B9789 Other viral agents as the cause of diseases classified elsewhere: Secondary | ICD-10-CM | POA: Diagnosis not present

## 2017-12-03 DIAGNOSIS — J069 Acute upper respiratory infection, unspecified: Secondary | ICD-10-CM | POA: Diagnosis not present

## 2017-12-03 MED ORDER — HYDROCOD POLST-CPM POLST ER 10-8 MG/5ML PO SUER: 5.0000 mL | Freq: Every evening | ORAL | 0 refills | Status: DC | PRN

## 2017-12-03 MED ORDER — DEXAMETHASONE SODIUM PHOSPHATE 10 MG/ML IJ SOLN
10.0000 mg | Freq: Once | INTRAMUSCULAR | Status: AC
  Administered 2017-12-03: 10 mg via INTRAMUSCULAR

## 2017-12-03 NOTE — Progress Notes (Signed)
HPI  Pt presents to the clinic today with c/oheadache, facial pressure, runny nose, right ear pain, cough and shortness of breath. She reports this started 3 days ago. He is blowing clear mucous out of his nose. The cough is non productive. He denies fever, chills or body aches. He has tried Mucinex with minimal relief. He has a history of PE, asthma and DM 2. He has not had sick contacts.  Review of Systems      Past Medical History:  Diagnosis Date  . Arthritis    hands   . Asthma   . Chronic atrial fibrillation    a.fib on warfarin- Dr. Percival Spanish follows  . Diverticulosis of colon (without mention of hemorrhage)   . Dysrhythmia    a fib  . ED (erectile dysfunction)   . Fibromyalgia   . GERD (gastroesophageal reflux disease)   . Hard of hearing    wears hearing aids  . Headache   . History of blood clots    behind right knee and then went into left lung 39yrs ago  . History of colon polyps   . History of kidney stones   . Hx of diabetes mellitus    "no longer diabetic since gastric bypass" - no longer taking metformin"  in 2 months "problems with low blood sugar now"  . Hx of gout    but doesn't take any meds  . Inflammation of colonic mucosa    recent admission and release from [facility] 02-28-15-remains on oral antibiotic  . Obesity   . Peripheral vascular disease ([place])   . Pneumonia    last time about 46yrs ago  . Prostate cancer ([place])   . Pulmonary embolism ([place])    over 10 yrs ago "many years ago"  . Restless legs syndrome (RLS)   . Skin cancer   . Unspecified asthma(493.90)    as a child  . Unspecified essential hypertension    hx of no longer on medication due to gastric bypass     Family History  Problem Relation Age of Onset  . Uterine cancer Mother        mets  . Breast cancer Mother   . Cancer Mother        cervical  . Emphysema Father   . Alzheimer's disease Sister   . Prostate cancer Brother   . Heart attack Brother   . Colon cancer Brother 33     Social History   Socioeconomic History  . Marital status: Married    Spouse name: Not on file  . Number of children: 1  . Years of education: Not on file  . Highest education level: Not on file  Occupational History  . Occupation: Retired    Comment: Investment banker, operational  . Financial resource strain: Not on file  . Food insecurity:    Worry: Not on file    Inability: Not on file  . Transportation needs:    Medical: Not on file    Non-medical: Not on file  Tobacco Use  . Smoking status: Never Smoker  . Smokeless tobacco: Never Used  Substance and Sexual Activity  . Alcohol use: No    Alcohol/week: 0.0 standard drinks  . Drug use: No  . Sexual activity: Yes  Lifestyle  . Physical activity:    Days per week: Not on file    Minutes per session: Not on file  . Stress: Not on file  Relationships  . Social connections:  Talks on phone: Not on file    Gets together: Not on file    Attends religious service: Not on file    Active member of club or organization: Not on file    Attends meetings of clubs or organizations: Not on file    Relationship status: Not on file  . Intimate partner violence:    Fear of current or ex partner: Not on file    Emotionally abused: Not on file    Physically abused: Not on file    Forced sexual activity: Not on file  Other Topics Concern  . Not on file  Social History Narrative   DIET: 3 meals, F&V, some water, crystal light, No fast food   Exercise: walks treadmill 30 min      1 Caffeine drinks daily       No living will.           Allergies  Allergen Reactions  . Sulfacetamide Sodium Swelling    throat swelling  . Latex Itching  . Adhesive [Tape] Other (See Comments)    Tears skin-- "No tape of any kind, they all tear skin right off" Tears skin-- "No tape of any kind, they all tear skin right off"     Constitutional: Positive headache. Denies fatigue, fever or abrupt weight changes.  HEENT:  Positive facial  pressure, runny nose, ear pain. Denies eye redness, eye pain, pressure behind the eyes, facial pain, nasal congestion, ringing in the ears, wax buildup, or sore throat. Respiratory: Positive cough, shortness of breath. Denies difficulty breathing.  Cardiovascular: Denies chest pain, chest tightness, palpitations or swelling in the hands or feet.   No other specific complaints in a complete review of systems (except as listed in HPI above).  Objective:   BP 124/72   Pulse 82   Temp 98.4 F (36.9 C) (Oral)   Wt 195 lb (88.5 kg)   SpO2 98%   BMI 26.45 kg/m  Wt Readings from Last 3 Encounters:  12/03/17 195 lb (88.5 kg)  11/25/17 194 lb 12.8 oz (88.4 kg)  10/14/17 190 lb 4 oz (86.3 kg)     General: Appears his stated age, well developed, well nourished in NAD. HEENT: Head: normal shape and size, no sinus tendernessnoted;  Ears: Tm's gray and intact, normal light reflex, + serous effusion on the right; Nose: mucosa pink and moist, septum midline; Throat/Mouth: + PND. Teeth present, mucosa pink and moist, no exudate noted, no lesions or ulcerations noted.  Neck: No cervical lymphadenopathy.  Cardiovascular: Normal rate and rhythm.  Pulmonary/Chest: Normal effort and positive vesicular breath sounds. No respiratory distress. No wheezes, rales or ronchi noted.       Assessment & Plan:   Viral Upper Respiratory Infection with Cough:  Get some rest and drink plenty of water Not a good idea to take antihistamines due to prostate issues Refused Flonase, Astelin etc Decadron 10 mg IM today Has Albuterol inhaler, advised him to use every 4-6 hours for SOB eRx for Tussionex cough syrup  RTC as needed or if symptoms persist.   Webb Silversmith, NP

## 2017-12-03 NOTE — Patient Instructions (Signed)

## 2017-12-03 NOTE — Addendum Note (Signed)
Addended by: Lurlean Nanny on: 12/03/2017 04:48 PM   Modules accepted: Orders

## 2017-12-07 ENCOUNTER — Ambulatory Visit: Payer: Self-pay

## 2017-12-07 MED ORDER — PREDNISONE 20 MG PO TABS: ORAL_TABLET | ORAL | 0 refills | Status: DC

## 2017-12-07 NOTE — Telephone Encounter (Signed)
Incoming call from patient stating he does not feel better than when he was seen last Friday.  Patient is c/o SOB.  Patient states it never went away form occurrence on Friday.   Patient states the SOB is constant.  Rates the SOB as Severe.   Has had this before.  Was Treated with medication.  Has a history of Afib 60 years ago. History of asthma.  Patients reports a temperature of 100.2 that broke yesterday. Attempted to schedule with PCP, Dr. Diona Browner per ( Patient request  Strong )  No availability.  Call to office / message pending, regarding appointment.  Encouraged Patient to seek further evaluation with ED or Urgent Care.  Patient voiced understanding but remains addiment about seeing Dr.  Diona Browner.    Reason for Disposition . [1] MODERATE longstanding difficulty breathing (e.g., speaks in phrases, SOB even at rest, pulse 100-120) AND [2] SAME as normal  Answer Assessment - Initial Assessment Questions 1. RESPIRATORY STATUS: "Describe your breathing?" (e.g., wheezing, shortness of breath, unable to speak, severe coughing)      SOB 2. ONSET: "When did this breathing problem begin?"      Never went away, FRIday 3. PATTERN "Does the difficult breathing come and go, or has it been constant since it started?"      constant 4. SEVERITY: "How bad is your breathing?" (e.g., mild, moderate, severe)    - MILD: No SOB at rest, mild SOB with walking, speaks normally in sentences, can lay down, no retractions, pulse < 100.    - MODERATE: SOB at rest, SOB with minimal exertion and prefers to sit, cannot lie down flat, speaks in phrases, mild retractions, audible wheezing, pulse 100-120.    - SEVERE: Very SOB at rest, speaks in single words, struggling to breathe, sitting hunched forward, retractions, pulse > 120      Severe 5. RECURRENT SYMPTOM: "Have you had difficulty breathing before?" If so, ask: "When was the last time?" and "What happened that time?"      medication 6. CARDIAC HISTORY: "Do you have  any history of heart disease?" (e.g., heart attack, angina, bypass surgery, angioplasty)      atrial FIB 7. LUNG HISTORY: "Do you have any history of lung disease?"  (e.g., pulmonary embolus, asthma, emphysema)     60 years  26. CAUSE: "What do you think is causing the breathing problem?"       lung clear9. OTHER SYMPTOMS: "Do you have any other symptoms? (e.g., dizziness, runny nose, cough, chest pain, fever)     One yesterday.  100.2 yesterday  10. PREGNANCY: "Is there any chance you are pregnant?" "When was your last menstrual period?"        na 11. TRAVEL: "Have you traveled out of the country in the last month?" (e.g., travel history, exposures)       no  Protocols used: BREATHING DIFFICULTY-A-AH

## 2017-12-07 NOTE — Addendum Note (Signed)
Addended by: Eliezer Lofts E on: 12/07/2017 04:41 PM   Modules accepted: Orders

## 2017-12-07 NOTE — Telephone Encounter (Signed)
I think prednisone is most appropriate in this situation. Appears likely it is viral infection. If not improving pt needs to follow up.

## 2017-12-07 NOTE — Telephone Encounter (Addendum)
I spoke with [name]; [name] said that it is hard to breathe and feels like a weight on his chest. [name] said he is not having any chest pain. On 12/06/17 fever was 100.2 but [name] does not think has temp today. Albuterol inhaler is not helping at all. Tussinex is helping non prod cough. Advised [name] per Dr Diona Browner instruction that she will send in prednisone to [facility] and see [name] on 12/10/17 at 10:45.if [name] condition worsens prior to appt [name] will go to ED. [name] voiced understanding. [name] said he prefers not to take prednisone due to prednisone causing [name] to swell and hold a lot of fluid. [name] request a different med to [place]. [name] request cb after Dr Diona Browner reviews.

## 2017-12-07 NOTE — Telephone Encounter (Signed)
Spoke to pt. He will take it.

## 2017-12-08 MED ORDER — PREDNISONE 20 MG PO TABS: ORAL_TABLET | ORAL | 0 refills | Status: DC

## 2017-12-08 NOTE — Addendum Note (Signed)
Addended by: Carter Kitten on: 12/08/2017 09:21 AM   Modules accepted: Orders

## 2017-12-08 NOTE — Telephone Encounter (Signed)
[facility] stating patient was expecting Rx for prednisone but they never received it.  Looks like Rx was set on phone in and not normal..Rx resent electronically.

## 2017-12-10 ENCOUNTER — Encounter: Payer: Self-pay | Admitting: Family Medicine

## 2017-12-10 ENCOUNTER — Ambulatory Visit (INDEPENDENT_AMBULATORY_CARE_PROVIDER_SITE_OTHER): Payer: Medicare Other | Admitting: Family Medicine

## 2017-12-10 DIAGNOSIS — J441 Chronic obstructive pulmonary disease with (acute) exacerbation: Secondary | ICD-10-CM | POA: Diagnosis not present

## 2017-12-10 MED ORDER — HYDROCOD POLST-CPM POLST ER 10-8 MG/5ML PO SUER: 5.0000 mL | Freq: Every evening | ORAL | 0 refills | Status: DC | PRN

## 2017-12-10 NOTE — Assessment & Plan Note (Signed)
Viral trigger. Improving, but will take more time. Complete pred taper.  Refilled cough supressant.

## 2017-12-10 NOTE — Patient Instructions (Signed)
Complete prednisone course.  Use albuterol as needed for wheeze.  use cough suppressant as needed.  Call if not continuing to improve as expected.

## 2017-12-10 NOTE — Progress Notes (Signed)
Subjective:    Patient ID: [name], male    DOB: 1940/07/02, 77 y.o.   MRN: [id number]  Shortness of Breath  This is a new problem. The current episode started 1 to 4 weeks ago. The problem has been gradually worsening. Associated symptoms include ear pain, PND, rhinorrhea and sputum production. Pertinent negatives include no fever, orthopnea, sore throat or wheezing. His past medical history is significant for asthma and COPD.  Cough  This is a new problem. The current episode started 1 to 4 weeks ago. The problem has been gradually improving. The cough is productive of sputum. Associated symptoms include ear pain and rhinorrhea. Pertinent negatives include no fever, sore throat, shortness of breath or wheezing. Risk factors for lung disease include smoking/tobacco exposure (former smoker). He has tried a beta-agonist inhaler and prescription cough suppressant (cough suppressant, albuterol) for the symptoms. His past medical history is significant for asthma and COPD.    Started prednisone 2 days ago. Chest tightness is not as bad as it was but still pain in chest with deep breaths    Blood pressure 124/78, pulse 87, temperature 97.9 F (36.6 C), temperature source Oral, weight 189 lb (85.7 kg), SpO2 97 %.    Review of Systems  Constitutional: Negative for fever.  HENT: Positive for ear pain and rhinorrhea. Negative for sore throat.   Respiratory: Positive for cough and sputum production. Negative for shortness of breath and wheezing.   Cardiovascular: Positive for PND. Negative for orthopnea.       Objective:   Physical Exam  Constitutional: Vital signs are normal. He appears well-developed and well-nourished.  Non-toxic appearance. He does not appear ill. No distress.  HENT:  Head: Normocephalic and atraumatic.  Right Ear: Hearing, tympanic membrane, external ear and ear canal normal. No tenderness. No foreign bodies. Tympanic membrane is not retracted and not bulging.    Left Ear: Hearing, tympanic membrane, external ear and ear canal normal. No tenderness. No foreign bodies. Tympanic membrane is not retracted and not bulging.  Nose: Nose normal. No mucosal edema or rhinorrhea. Right sinus exhibits no maxillary sinus tenderness and no frontal sinus tenderness. Left sinus exhibits no maxillary sinus tenderness and no frontal sinus tenderness.  Mouth/Throat: Uvula is midline, oropharynx is clear and moist and mucous membranes are normal. Normal dentition. No dental caries. No oropharyngeal exudate or tonsillar abscesses.  Eyes: Pupils are equal, round, and reactive to light. Conjunctivae, EOM and lids are normal. Lids are everted and swept, no foreign bodies found.  Neck: Trachea normal, normal range of motion and phonation normal. Neck supple. Carotid bruit is not present. No thyroid mass and no thyromegaly present.  Cardiovascular: Normal rate, regular rhythm, S1 normal, S2 normal, normal heart sounds, intact distal pulses and normal pulses. Exam reveals no gallop.  No murmur heard. Pulmonary/Chest: Effort normal and breath sounds normal. No respiratory distress. He has no decreased breath sounds. He has no wheezes. He has no rhonchi. He has no rales.  occ scattered cough  Abdominal: Soft. Normal appearance and bowel sounds are normal. There is no hepatosplenomegaly. There is no tenderness. There is no rebound, no guarding and no CVA tenderness. No hernia.  Neurological: He is alert. He has normal reflexes.  Skin: Skin is warm, dry and intact. No rash noted.  Psychiatric: He has a normal mood and affect. His speech is normal and behavior is normal. Judgment normal.          Assessment & Plan:

## 2017-12-13 ENCOUNTER — Ambulatory Visit (INDEPENDENT_AMBULATORY_CARE_PROVIDER_SITE_OTHER): Payer: Medicare Other

## 2017-12-13 DIAGNOSIS — Z7901 Long term (current) use of anticoagulants: Secondary | ICD-10-CM | POA: Diagnosis not present

## 2017-12-13 DIAGNOSIS — I4891 Unspecified atrial fibrillation: Secondary | ICD-10-CM

## 2017-12-13 DIAGNOSIS — Z5181 Encounter for therapeutic drug level monitoring: Secondary | ICD-10-CM

## 2017-12-13 DIAGNOSIS — I824Y9 Acute embolism and thrombosis of unspecified deep veins of unspecified proximal lower extremity: Secondary | ICD-10-CM | POA: Diagnosis not present

## 2017-12-13 LAB — POCT INR: INR: 4.9 — AB (ref 2.0–3.0)

## 2017-12-13 NOTE — Patient Instructions (Signed)
Description   Skip today and tomorrow's dosage of Coumadin, then resume same dosage 1.5 tablets everyday except 1 tablet on Sundays and Thursdays.  Recheck in 2 weeks. Call if you start any new medications [phone].

## 2017-12-20 ENCOUNTER — Other Ambulatory Visit: Payer: Self-pay | Admitting: Family Medicine

## 2017-12-21 ENCOUNTER — Telehealth: Payer: Self-pay | Admitting: *Deleted

## 2017-12-21 ENCOUNTER — Other Ambulatory Visit: Payer: Self-pay | Admitting: Family Medicine

## 2017-12-21 ENCOUNTER — Encounter: Payer: Self-pay | Admitting: Family Medicine

## 2017-12-21 ENCOUNTER — Ambulatory Visit (INDEPENDENT_AMBULATORY_CARE_PROVIDER_SITE_OTHER): Payer: Medicare Other | Admitting: Family Medicine

## 2017-12-21 ENCOUNTER — Encounter: Payer: Self-pay | Admitting: *Deleted

## 2017-12-21 VITALS — BP 97/62 | HR 81 | Temp 97.8°F | Ht 72.25 in | Wt 187.5 lb

## 2017-12-21 DIAGNOSIS — J4541 Moderate persistent asthma with (acute) exacerbation: Secondary | ICD-10-CM | POA: Diagnosis not present

## 2017-12-21 DIAGNOSIS — J441 Chronic obstructive pulmonary disease with (acute) exacerbation: Secondary | ICD-10-CM | POA: Diagnosis not present

## 2017-12-21 MED ORDER — AZITHROMYCIN 250 MG PO TABS: ORAL_TABLET | ORAL | 0 refills | Status: DC

## 2017-12-21 MED ORDER — BUDESONIDE-FORMOTEROL FUMARATE 160-4.5 MCG/ACT IN AERO: 2.0000 | INHALATION_SPRAY | Freq: Two times a day (BID) | RESPIRATORY_TRACT | 3 refills | Status: DC

## 2017-12-21 MED ORDER — MOMETASONE FURO-FORMOTEROL FUM 200-5 MCG/ACT IN AERO: 2.0000 | INHALATION_SPRAY | Freq: Two times a day (BID) | RESPIRATORY_TRACT | 11 refills | Status: DC

## 2017-12-21 MED ORDER — FLUTICASONE-SALMETEROL 250-50 MCG/DOSE IN AEPB: 1.0000 | INHALATION_SPRAY | Freq: Two times a day (BID) | RESPIRATORY_TRACT | 11 refills | Status: DC

## 2017-12-21 NOTE — Telephone Encounter (Signed)
Sent in new rx for dulera before this note was seen. There was no note on prescription request from pharmacy, so I just picked another. I will now send in rx for Advair given it is likely cheaper.

## 2017-12-21 NOTE — Telephone Encounter (Signed)
Last office visit 12/10/2017 for COPD.  Last refilled 11/23/2017 for #30 with no refills.  Next Appt: today 12/21/2017.

## 2017-12-21 NOTE — Addendum Note (Signed)
Addended by: Eliezer Lofts E on: 12/21/2017 05:31 PM   Modules accepted: Orders

## 2017-12-21 NOTE — Telephone Encounter (Signed)
[facility] [place] [facility] who states pts Symbicort is not covered, and will cost <$100. She states generic advair is covered and is requesting a new Rx be sent to [facility].

## 2017-12-21 NOTE — Addendum Note (Signed)
Addended by: Eliezer Lofts E on: 12/21/2017 05:25 PM   Modules accepted: Orders

## 2017-12-21 NOTE — Assessment & Plan Note (Signed)
Poor control... Stop asmanex and change to symbicort 160 mcg daily.  given likely bacterial trigger given sputum change and > 3 weeks symptoms.. Treat with azithromycin.

## 2017-12-21 NOTE — Patient Instructions (Addendum)
Complete a course of antibiotics. Close follow up for INR after starting antibiotics.  Stop Asmanex  Start Symbicort  2 puffs twice daily.. At least take prior to allergy seasons.

## 2017-12-21 NOTE — Progress Notes (Signed)
   Subjective:    Patient ID: [name], male    DOB: March 24, 1940, 77 y.o.   MRN: [id number]  Cough  Associated symptoms include shortness of breath. Pertinent negatives include no chest pain, ear pain, fever or headaches.  Shortness of Breath  Pertinent negatives include no abdominal pain, chest pain, ear pain, fever, headaches or leg swelling.   77 year old male with COPD/asthma  with recent COPD/asthma exacerbation.  pt states no smoking history and states never told COPD in past but has always had asthma.  Changed problem list to reflect.   Seen on 12/03/2017 and 12/10/2017 Felt viral treated with prednisone taper, cough suppressant  Today he presents with continued  worsen cough, productive with yellow sputum. No fever.  SOB and wheeze worse in last few days.. Since being off prednsione.  Weight in center of chest.   He has been using asmanex now 2 puffs in AM   Review of Systems  Constitutional: Negative for fatigue and fever.  HENT: Negative for ear pain.   Eyes: Negative for pain.  Respiratory: Positive for cough and shortness of breath.   Cardiovascular: Negative for chest pain, palpitations and leg swelling.  Gastrointestinal: Negative for abdominal pain.  Genitourinary: Negative for dysuria.  Musculoskeletal: Negative for arthralgias.  Neurological: Negative for syncope, light-headedness and headaches.  Psychiatric/Behavioral: Negative for dysphoric mood.       Objective:   Physical Exam  Constitutional: Vital signs are normal. He appears well-developed and well-nourished.  Non-toxic appearance. He does not appear ill. No distress.  HENT:  Head: Normocephalic and atraumatic.  Right Ear: Hearing, tympanic membrane, external ear and ear canal normal. No tenderness. No foreign bodies. Tympanic membrane is not retracted and not bulging.  Left Ear: Hearing, tympanic membrane, external ear and ear canal normal. No tenderness. No foreign bodies. Tympanic membrane is  not retracted and not bulging.  Nose: Nose normal. No mucosal edema or rhinorrhea. Right sinus exhibits no maxillary sinus tenderness and no frontal sinus tenderness. Left sinus exhibits no maxillary sinus tenderness and no frontal sinus tenderness.  Mouth/Throat: Uvula is midline, oropharynx is clear and moist and mucous membranes are normal. Normal dentition. No dental caries. No oropharyngeal exudate or tonsillar abscesses.  Eyes: Pupils are equal, round, and reactive to light. Conjunctivae, EOM and lids are normal. Lids are everted and swept, no foreign bodies found.  Neck: Trachea normal, normal range of motion and phonation normal. Neck supple. Carotid bruit is not present. No thyroid mass and no thyromegaly present.  Cardiovascular: Normal rate, regular rhythm, S1 normal, S2 normal, normal heart sounds, intact distal pulses and normal pulses. Exam reveals no gallop.  No murmur heard. Pulmonary/Chest: Effort normal and breath sounds normal. No respiratory distress. He has no wheezes. He has no rhonchi. He has no rales.   Good air movement throughout.   Abdominal: Soft. Normal appearance and bowel sounds are normal. There is no hepatosplenomegaly. There is no tenderness. There is no rebound, no guarding and no CVA tenderness. No hernia.  Neurological: He is alert. He has normal reflexes.  Skin: Skin is warm, dry and intact. No rash noted.  Psychiatric: He has a normal mood and affect. His speech is normal and behavior is normal. Judgment normal.          Assessment & Plan:

## 2017-12-22 NOTE — Telephone Encounter (Signed)
[facility] [place] [facility].  They filled the Advair which now is generic and it only cost [name] $11.

## 2017-12-24 ENCOUNTER — Telehealth: Payer: Self-pay | Admitting: *Deleted

## 2017-12-24 ENCOUNTER — Other Ambulatory Visit: Payer: Self-pay | Admitting: Urology

## 2017-12-24 DIAGNOSIS — C61 Malignant neoplasm of prostate: Secondary | ICD-10-CM

## 2017-12-24 NOTE — Telephone Encounter (Signed)
Called patient to inform of gold seed and space oar for 01-27-18 @ Dr. Lynne Logan Office and his sim on 02-11-18 @ 10 am @ Dr. Johny Shears office, lvm for a return call

## 2017-12-27 ENCOUNTER — Ambulatory Visit (INDEPENDENT_AMBULATORY_CARE_PROVIDER_SITE_OTHER): Payer: Medicare Other | Admitting: *Deleted

## 2017-12-27 DIAGNOSIS — Z5181 Encounter for therapeutic drug level monitoring: Secondary | ICD-10-CM

## 2017-12-27 DIAGNOSIS — I824Y9 Acute embolism and thrombosis of unspecified deep veins of unspecified proximal lower extremity: Secondary | ICD-10-CM

## 2017-12-27 DIAGNOSIS — Z7901 Long term (current) use of anticoagulants: Secondary | ICD-10-CM

## 2017-12-27 DIAGNOSIS — I4891 Unspecified atrial fibrillation: Secondary | ICD-10-CM | POA: Diagnosis not present

## 2017-12-27 LAB — POCT INR: INR: 2.8 (ref 2.0–3.0)

## 2017-12-27 NOTE — Patient Instructions (Signed)
Description   Continue taking 1.5 tablets everyday except 1 tablet on Sundays and Thursdays.  Recheck in 4 weeks. Call if you start any new medications [phone].

## 2017-12-31 ENCOUNTER — Ambulatory Visit (INDEPENDENT_AMBULATORY_CARE_PROVIDER_SITE_OTHER)
Admission: RE | Admit: 2017-12-31 | Discharge: 2017-12-31 | Disposition: A | Discharge status: Home / Self Care | Payer: Medicare Other | Source: Ambulatory Visit | Attending: Family Medicine | Admitting: Family Medicine

## 2017-12-31 ENCOUNTER — Encounter: Payer: Self-pay | Admitting: Family Medicine

## 2017-12-31 ENCOUNTER — Ambulatory Visit (INDEPENDENT_AMBULATORY_CARE_PROVIDER_SITE_OTHER): Payer: Medicare Other | Admitting: Family Medicine

## 2017-12-31 VITALS — BP 130/68 | HR 94 | Temp 98.3°F | Ht 72.25 in | Wt 182.1 lb

## 2017-12-31 DIAGNOSIS — R0602 Shortness of breath: Secondary | ICD-10-CM

## 2017-12-31 MED ORDER — LEVOFLOXACIN 500 MG PO TABS: 500.0000 mg | ORAL_TABLET | Freq: Every day | ORAL | 0 refills | Status: DC

## 2017-12-31 NOTE — Patient Instructions (Addendum)
Complete additional antibiotics and continue other medications.  Have INR checked after first few days of new antibiotics.  Go to ER if severe shortness of breath.

## 2017-12-31 NOTE — Progress Notes (Signed)
   Subjective:    Patient ID: [name], male    DOB: 25-Apr-1940, 77 y.o.   MRN: [id number]  HPI   77 year old male with history of Asthma/COPD.[name] recent exacerbation since  10/25.   S/P antibiotics, prednisone, new addition of  Advair   He states bilateral ear pain new, right > left.  Worsened SOB.[name] Coughing fits. Albuterol does not seem t help much. Less productive mucus after antibiotics.  No fever.  No chest pain.  Blood pressure 130/68, pulse 94, temperature 98.3 F (36.8 C), temperature source Oral, height 6' 0.25" (1.835 m), weight 182 lb 2 oz (82.6 kg), SpO2 97 %. Social History /Family History/Past Medical History reviewed in detail and updated in EMR if needed.  Review of Systems  Constitutional: Positive for fatigue.  HENT: Negative for ear pain and postnasal drip.   Eyes: Negative for pain.  Respiratory: Positive for cough and shortness of breath.   Cardiovascular: Negative for chest pain.       Objective:   Physical Exam Constitutional:      Appearance: He is well-developed.  HENT:     Head: Normocephalic.     Right Ear: Hearing normal.     Left Ear: Hearing normal.     Nose: Nose normal.  Neck:     Thyroid: No thyroid mass or thyromegaly.     Vascular: No carotid bruit.     Trachea: Trachea normal.  Cardiovascular:     Rate and Rhythm: Normal rate and regular rhythm.     Pulses: Normal pulses.     Heart sounds: Heart sounds not distant. No murmur. No friction rub. No gallop.      Comments: No peripheral edema Pulmonary:     Effort: Pulmonary effort is normal. No respiratory distress.     Breath sounds: Normal breath sounds.     Comments:  Scattered wheeze Skin:    General: Skin is warm and dry.     Findings: No rash.  Psychiatric:        Speech: Speech normal.        Behavior: Behavior normal.        Thought Content: Thought content normal.           Assessment & Plan:

## 2018-01-03 ENCOUNTER — Telehealth: Payer: Self-pay | Admitting: Family Medicine

## 2018-01-03 NOTE — Telephone Encounter (Signed)
error 

## 2018-01-05 ENCOUNTER — Encounter: Payer: Self-pay | Admitting: Family Medicine

## 2018-01-05 ENCOUNTER — Ambulatory Visit (INDEPENDENT_AMBULATORY_CARE_PROVIDER_SITE_OTHER): Payer: Medicare Other | Admitting: Family Medicine

## 2018-01-05 VITALS — BP 100/70 | HR 97 | Temp 98.3°F | Ht 72.25 in | Wt 180.1 lb

## 2018-01-05 DIAGNOSIS — J441 Chronic obstructive pulmonary disease with (acute) exacerbation: Secondary | ICD-10-CM | POA: Diagnosis not present

## 2018-01-05 MED ORDER — HYDROCOD POLST-CPM POLST ER 10-8 MG/5ML PO SUER: 5.0000 mL | Freq: Two times a day (BID) | ORAL | 0 refills | Status: DC | PRN

## 2018-01-05 MED ORDER — IPRATROPIUM-ALBUTEROL 0.5-2.5 (3) MG/3ML IN SOLN: 3.0000 mL | RESPIRATORY_TRACT | 0 refills | Status: DC | PRN

## 2018-01-05 NOTE — Patient Instructions (Signed)
This is an asthma exacerbation and pneumonia It can take up to 6 weeks following pneumonia for the cough to get better  For the next 24 hours --> Use your nebulizer every 4 hours while awake. Then go to every 6 hours while awake. Decrease to every 8 hours as symptoms improve  If you develop worsening breathing or fever and chills seek medical attention

## 2018-01-05 NOTE — Progress Notes (Signed)
Subjective:     [name] is a 77 y.o. male presenting for Cough (wants repeat x-ray) and Shortness of Breath     HPI   #Cough - continues to have SOB and cough - lungs on XR were "foggy" last time and would like to know if they are better - using albuterol and nebulizer -- using 4 times per day (both 2 times per day) - symptoms x 5 weeks - started Levofloxacin 5 days with some improvement > feeling in head improved, breathing has not improved - coughing has not improved - Advair --> taking 2 times a day  Uses nebulizer in AM and PM  Did Nebulizer around 7 am and then albuterol x 1  Never smoker   Using flonase for sinus congestion  12/31/2017: Clinic - CXR with pneumonia. Recent COPD exacerbation in 11/12 (s/p azithro, prednisone).    Review of Systems  Constitutional: Negative for chills and fever.  HENT: Positive for postnasal drip and rhinorrhea.   Respiratory: Positive for cough and shortness of breath.   Cardiovascular: Negative for chest pain.  Gastrointestinal: Negative for abdominal pain, nausea and vomiting.  Musculoskeletal: Negative for myalgias.     Social History   Tobacco Use  Smoking Status Never Smoker  Smokeless Tobacco Never Used        Objective:    BP Readings from Last 3 Encounters:  01/05/18 100/70  12/31/17 130/68  12/21/17 97/62   Wt Readings from Last 3 Encounters:  01/05/18 180 lb 1 oz (81.7 kg)  12/31/17 182 lb 2 oz (82.6 kg)  12/21/17 187 lb 8 oz (85 kg)    BP 100/70   Pulse 97   Temp 98.3 F (36.8 C) (Oral)   Ht 6' 0.25" (1.835 m)   Wt 180 lb 1 oz (81.7 kg) Comment: patient reported  SpO2 97%   BMI 24.25 kg/m    Physical Exam  Constitutional: He appears well-developed and well-nourished. He does not appear ill. No distress.  HENT:  Head: Normocephalic and atraumatic.  Right Ear: Tympanic membrane and ear canal normal.  Left Ear: Tympanic membrane and ear canal normal.  Nose: Mucosal edema and  rhinorrhea present. Right sinus exhibits no maxillary sinus tenderness and no frontal sinus tenderness. Left sinus exhibits no maxillary sinus tenderness and no frontal sinus tenderness.  Mouth/Throat: Uvula is midline and mucous membranes are normal. Posterior oropharyngeal erythema present. No oropharyngeal exudate or posterior oropharyngeal edema. Tonsils are 0 on the right. Tonsils are 0 on the left.  Eyes: EOM are normal.  Neck: Neck supple.  Cardiovascular: Normal rate and regular rhythm.  No murmur heard. Pulmonary/Chest: Effort normal. No respiratory distress. He has decreased breath sounds. He has wheezes. He has no rhonchi. He has no rales.  Lymphadenopathy:    He has no cervical adenopathy.  Neurological: He is alert.  Skin: Skin is warm and dry. Capillary refill takes less than 2 seconds.  Psychiatric: He has a normal mood and affect.          Assessment & Plan:   Problem List Items Addressed This Visit    None    Visit Diagnoses    COPD with acute exacerbation ([place])    -  Primary   Relevant Medications   chlorpheniramine-HYDROcodone (TUSSIONEX) 10-8 MG/5ML SUER   ipratropium-albuterol (DUONEB) 0.5-2.5 (3) MG/3ML SOLN   Other Relevant Orders   Ambulatory referral to Pulmonology     Declined nebulizer in clinic.  Discussed that 1 week is  too early for repeat CXR as pneumonia takes up to 6 weeks to resolve on imaging Endorsed some mild improvement with currently Abx and since no response initially to prednisone did not feel repeat was necessary.   Patient concerned due to length of illness and missing work that he may lose his job. Discussed that it will take time to recover, though still with significant wheezing on exam.   Duoneb sent in, though may be cost prohibitive and if so OK to use albuterol Discussed Pulm referral if still having difficulty resolving for additional management suggestions.   VS overall normal, so do not feel hospitalization is necessary at  this time   Return if symptoms worsen or fail to improve.  Lesleigh Noe, MD

## 2018-01-08 ENCOUNTER — Other Ambulatory Visit: Payer: Self-pay | Admitting: Family Medicine

## 2018-01-09 ENCOUNTER — Other Ambulatory Visit: Payer: Self-pay | Admitting: Cardiology

## 2018-01-10 ENCOUNTER — Encounter: Payer: Self-pay | Admitting: Emergency Medicine

## 2018-01-10 ENCOUNTER — Ambulatory Visit (INDEPENDENT_AMBULATORY_CARE_PROVIDER_SITE_OTHER): Payer: Medicare Other | Admitting: Emergency Medicine

## 2018-01-10 VITALS — BP 112/68 | HR 89 | Ht 72.25 in | Wt 180.0 lb

## 2018-01-10 DIAGNOSIS — R197 Diarrhea, unspecified: Secondary | ICD-10-CM

## 2018-01-10 DIAGNOSIS — R0600 Dyspnea, unspecified: Secondary | ICD-10-CM | POA: Diagnosis not present

## 2018-01-10 DIAGNOSIS — R053 Chronic cough: Secondary | ICD-10-CM

## 2018-01-10 DIAGNOSIS — R109 Unspecified abdominal pain: Secondary | ICD-10-CM

## 2018-01-10 DIAGNOSIS — J4541 Moderate persistent asthma with (acute) exacerbation: Secondary | ICD-10-CM

## 2018-01-10 DIAGNOSIS — R05 Cough: Secondary | ICD-10-CM | POA: Diagnosis not present

## 2018-01-10 LAB — COMPREHENSIVE METABOLIC PANEL
ALT: 13 U/L (ref 0–53)
AST: 20 U/L (ref 0–37)
Albumin: 3.6 g/dL (ref 3.5–5.2)
Alkaline Phosphatase: 93 U/L (ref 39–117)
BUN: 22 mg/dL (ref 6–23)
CALCIUM: 8.7 mg/dL (ref 8.4–10.5)
CHLORIDE: 99 meq/L (ref 96–112)
CO2: 24 mEq/L (ref 19–32)
CREATININE: 1.4 mg/dL (ref 0.40–1.50)
GFR: 52.34 mL/min — ABNORMAL LOW (ref >60.00)
Glucose, Bld: 191 mg/dL — ABNORMAL HIGH (ref 70–99)
Potassium: 4.2 mEq/L (ref 3.5–5.1)
Sodium: 134 mEq/L — ABNORMAL LOW (ref 135–145)
Total Bilirubin: 0.4 mg/dL (ref 0.2–1.2)
Total Protein: 7 g/dL (ref 6.0–8.3)

## 2018-01-10 LAB — CBC WITH DIFFERENTIAL/PLATELET
Basophils Absolute: 0.1 10*3/uL (ref 0.0–0.1)
Basophils Relative: 0.7 % (ref 0.0–3.0)
Eosinophils Absolute: 0.5 10*3/uL (ref 0.0–0.7)
Eosinophils Relative: 3.5 % (ref 0.0–5.0)
HCT: 39.8 % (ref 39.0–52.0)
Hemoglobin: 13.1 g/dL (ref 13.0–17.0)
LYMPHS PCT: 7.9 % — AB (ref 12.0–46.0)
Lymphs Abs: 1.1 10*3/uL (ref 0.7–4.0)
MCHC: 32.9 g/dL (ref 30.0–36.0)
MCV: 90.9 fl (ref 78.0–100.0)
Monocytes Absolute: 1.4 10*3/uL — ABNORMAL HIGH (ref 0.1–1.0)
Monocytes Relative: 10.3 % (ref 3.0–12.0)
Neutro Abs: 10.7 10*3/uL — ABNORMAL HIGH (ref 1.4–7.7)
Neutrophils Relative %: 77.6 % — ABNORMAL HIGH (ref 43.0–77.0)
Platelets: 340 10*3/uL (ref 150.0–400.0)
RBC: 4.38 Mil/uL (ref 4.22–5.81)
RDW: 14 % (ref 11.5–15.5)
WBC: 13.8 10*3/uL — ABNORMAL HIGH (ref 4.0–10.5)

## 2018-01-10 LAB — BRAIN NATRIURETIC PEPTIDE: Pro B Natriuretic peptide (BNP): 123 pg/mL — ABNORMAL HIGH (ref 0.0–100.0)

## 2018-01-10 NOTE — Assessment & Plan Note (Signed)
He had childhood asthma and carries a history of adult asthma although his pulmonary function testing from 2013 did not show significant obstruction, and his current syndrome is somewhat inconsistent.  In particular he is dyspneic currently but does not have any wheezing or evidence of obstruction.  Likewise through the entire illness he has not responded to bronchodilators as I would predict if obstruction was responsible.  He does need repeat pulmonary function testing but it is not clear to me that he could do this right now given his degree of respiratory discomfort.  I will try to schedule these as we go forward.  In the meantime I would defer prednisone because I do not see evidence of an exacerbation.  I will ask him to take his albuterol nebs, which are his only reliable bronchodilator currently, 3 times a day on a schedule until I can get him back in the office.

## 2018-01-10 NOTE — Patient Instructions (Addendum)
We will send blood work today We will check a CT scan of your chest and abdomen Please temporarily start taking your albuterol nebulizers 3 times a day on a schedule. We will arrange for you to see Wyn Quaker in our office this week to review your results and plan next steps.

## 2018-01-10 NOTE — Assessment & Plan Note (Signed)
I am concerned the the etiology of his dyspnea is not obstructive lung disease.  He is tachypneic but is moving air adequately.  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  Could consider thyroid disease as well.  Additionally he has more constitutional symptoms including diarrhea that are concerning for an underlying unifying diagnosis.   I will obtain a CT scan of his chest and abdomen, check CBC, CMP, BNP.  He may need a screening echocardiogram going forward.  If the above evaluations are unrevealing, question whether he would require admission for further evaluation.  I will have him come back to see [place] this week after the tests are done so that we can plan next steps.

## 2018-01-10 NOTE — Progress Notes (Signed)
Subjective:    Patient ID: [name], male    DOB: October 18, 1940, 77 y.o.   MRN: [id number]  HPI 77 year old never smoker, history of gastric bypass, hypertension, diabetes, remote DVT/PE, childhood asthma.  Fairly new diagnosis of recurrent prostate cancer based on biopsies from 10/07/2017, treated with external beam radiation and ST-ADT.  Carries a history of COPD although pulmonary function testing from 02/23/2011 shows only mild evidence for obstruction (based on F/V loop)  with normal lung volumes.  A review of the notes indicates that he has been dealing with more dyspnea since the end of October when he was felt to have a viral upper respiratory infection. He has kept unrelenting cough, only occasionally productive except for in the am. Has been dyspneic, progressively worse over that time period. At usual baseline he describes his exertional tolerance as good, he is able to walk without SOB. ? Fever intermittently. Beginning a week ago he has developed diarrhea, describes as watery, happening after anything PO including water. He has lost 13 lbs during this illness. CXR from 12/31/17 shows B base predominant interstitial opacities. He has used some albuterol nebs - helps dyspnea temporarily. There was an attempt to change his asmanex to symbicort, could not afford. He has old Advair at home but has not used.   He tells me that he is been treated with prednisone and also azithro, then levaquin.  His coumadin level has been at goal.   Review of Systems  Constitutional: Negative for fever and unexpected weight change.  HENT: Negative for congestion, dental problem, ear pain, nosebleeds, postnasal drip, rhinorrhea, sinus pressure, sneezing, sore throat and trouble swallowing.   Eyes: Negative for redness and itching.  Respiratory: Positive for cough, chest tightness, shortness of breath and wheezing.   Cardiovascular: Negative for palpitations and leg swelling.  Gastrointestinal: Negative  for nausea and vomiting.  Genitourinary: Negative for dysuria.  Musculoskeletal: Negative for joint swelling.  Skin: Negative for rash.  Neurological: Negative for headaches.  Hematological: Does not bruise/bleed easily.  Psychiatric/Behavioral: Negative for dysphoric mood. The patient is not nervous/anxious.     Past Medical History:  Diagnosis Date  . Arthritis    hands   . Asthma   . Chronic atrial fibrillation    a.fib on warfarin- Dr. Percival Spanish follows  . Diverticulosis of colon (without mention of hemorrhage)   . Dysrhythmia    a fib  . ED (erectile dysfunction)   . Fibromyalgia   . GERD (gastroesophageal reflux disease)   . Hard of hearing    wears hearing aids  . Headache   . History of blood clots    behind right knee and then went into left lung 30yrs ago  . History of colon polyps   . History of kidney stones   . Hx of diabetes mellitus    "no longer diabetic since gastric bypass" - no longer taking metformin"  in 2 months "problems with low blood sugar now"  . Hx of gout    but doesn't take any meds  . Inflammation of colonic mucosa    recent admission and release from [facility] 02-28-15-remains on oral antibiotic  . Obesity   . Peripheral vascular disease ([place])   . Pneumonia    last time about 52yrs ago  . Prostate cancer ([place])   . Pulmonary embolism ([place])    over 10 yrs ago "many years ago"  . Restless legs syndrome (RLS)   . Skin cancer   . Unspecified asthma(493.90)  as a child  . Unspecified essential hypertension    hx of no longer on medication due to gastric bypass      Family History  Problem Relation Age of Onset  . Uterine cancer Mother        mets  . Breast cancer Mother   . Cancer Mother        cervical  . Emphysema Father   . Alzheimer's disease Sister   . Prostate cancer Brother   . Heart attack Brother   . Colon cancer Brother 45     Social History   Socioeconomic History  . Marital status: Married    Spouse name: Not on file    . Number of children: 1  . Years of education: Not on file  . Highest education level: Not on file  Occupational History  . Occupation: Retired    Comment: Investment banker, operational  . Financial resource strain: Not on file  . Food insecurity:    Worry: Not on file    Inability: Not on file  . Transportation needs:    Medical: Not on file    Non-medical: Not on file  Tobacco Use  . Smoking status: Never Smoker  . Smokeless tobacco: Never Used  Substance and Sexual Activity  . Alcohol use: No    Alcohol/week: 0.0 standard drinks  . Drug use: No  . Sexual activity: Yes  Lifestyle  . Physical activity:    Days per week: Not on file    Minutes per session: Not on file  . Stress: Not on file  Relationships  . Social connections:    Talks on phone: Not on file    Gets together: Not on file    Attends religious service: Not on file    Active member of club or organization: Not on file    Attends meetings of clubs or organizations: Not on file    Relationship status: Not on file  . Intimate partner violence:    Fear of current or ex partner: Not on file    Emotionally abused: Not on file    Physically abused: Not on file    Forced sexual activity: Not on file  Other Topics Concern  . Not on file  Social History Narrative   DIET: 3 meals, F&V, some water, crystal light, No fast food   Exercise: walks treadmill 30 min      1 Caffeine drinks daily       No living will.            Allergies  Allergen Reactions  . Sulfacetamide Sodium Swelling    throat swelling  . Latex Itching  . Adhesive [Tape] Other (See Comments)    Tears skin-- "No tape of any kind, they all tear skin right off" Tears skin-- "No tape of any kind, they all tear skin right off"     Outpatient Medications Prior to Visit  Medication Sig Dispense Refill  . acetaminophen-codeine (TYLENOL #3) 300-30 MG tablet     . atorvastatin (LIPITOR) 80 MG tablet Take 1 tablet (80 mg total) by mouth at  bedtime. 90 tablet 3  . chlorpheniramine-HYDROcodone (TUSSIONEX) 10-8 MG/5ML SUER Take 5 mLs by mouth every 12 (twelve) hours as needed for cough. 140 mL 0  . diclofenac sodium (VOLTAREN) 1 % GEL APPLY 2-3 GRAMS (1 GRAM=1 INCH) TO AFFECTED AREA 4 TIMES DAILY  12  . FLUoxetine (PROZAC) 20 MG capsule TAKE 3 CAPSULES BY MOUTH EVERYDAY AT  BEDTIME 270 capsule 1  . Fluticasone-Salmeterol (ADVAIR DISKUS) 250-50 MCG/DOSE AEPB Inhale 1 puff into the lungs 2 (two) times daily. 1 each 11  . furosemide (LASIX) 20 MG tablet Take 1 tablet (20 mg total) by mouth every other day. 180 tablet 1  . gabapentin (NEURONTIN) 300 MG capsule TAKE 1 CAPSULE BY MOUTH TWICE A DAY 180 capsule 1  . hydrocortisone 2.5 % cream APPLY TO AFFECTED AREA TWICE A DAY  2  . ipratropium-albuterol (DUONEB) 0.5-2.5 (3) MG/3ML SOLN Take 3 mLs by nebulization every 4 (four) hours as needed. 360 mL 0  . omeprazole (PRILOSEC) 40 MG capsule TAKE 1 CAPSULE (40 MG TOTAL) BY MOUTH DAILY. 90 capsule 1  . primidone (MYSOLINE) 50 MG tablet 150 mg in the morning, 100 mg at night 450 tablet 1  . ranitidine (ZANTAC) 300 MG tablet TAKE 1 TABLET (300 MG TOTAL) BY MOUTH NIGHTLY.  3  . silodosin (RAPAFLO) 8 MG CAPS capsule Take by mouth.    . sucralfate (CARAFATE) 1 g tablet TAKE 1 TABLET 4 TIMES A DAY. DISSOLVE IN 1-2 OUNCES OF WATER AND SIP.  6  . tamsulosin (FLOMAX) 0.4 MG CAPS capsule     . tiZANidine (ZANAFLEX) 4 MG tablet     . warfarin (COUMADIN) 2.5 MG tablet TAKE 1 TO 1 AND 1/2 TABLETS DAILY AS DIRECTED BY COUMADIN CLINIC 135 tablet 1  . warfarin (COUMADIN) 2.5 MG tablet TAKE 1 TO 1.5 TABLETS BY MOUTH DAILY OR AS DIRECTED BY COUMADIN CLINIC 135 tablet 1  . levofloxacin (LEVAQUIN) 500 MG tablet Take 1 tablet (500 mg total) by mouth daily. 7 tablet 0   No facility-administered medications prior to visit.         Objective:   Physical Exam  Vitals:   01/10/18 1604  BP: 112/68  Pulse: 89  SpO2: 94%  Weight: 180 lb (81.6 kg)  Height: 6'  0.25" (1.835 m)   Gen: Somewhat ill-appearing, thin, tachypnic but in no distress,  normal affect  ENT: Very dry, erythematous, no lesions in the oropharynx.  He has temporal wasting  Neck: No JVD, no stridor  Lungs: He is tachypneic but is moving air adequately without any wheezing or crackles.  When I ask him to take a deep breath he reliably coughs  Cardiovascular: Irregular, no murmur, no peripheral edema  Musculoskeletal: No deformities, no cyanosis or clubbing  Neuro: alert, awake, non focal  Skin: Warm, no lesions or rash      Assessment & Plan:  Asthma, moderate persistent He had childhood asthma and carries a history of adult asthma although his pulmonary function testing from 2013 did not show significant obstruction, and his current syndrome is somewhat inconsistent.  In particular he is dyspneic currently but does not have any wheezing or evidence of obstruction.  Likewise through the entire illness he has not responded to bronchodilators as I would predict if obstruction was responsible.  He does need repeat pulmonary function testing but it is not clear to me that he could do this right now given his degree of respiratory discomfort.  I will try to schedule these as we go forward.  In the meantime I would defer prednisone because I do not see evidence of an exacerbation.  I will ask him to take his albuterol nebs, which are his only reliable bronchodilator currently, 3 times a day on a schedule until I can get him back in the office.  Persistent cough for 3 weeks or longer Inconsistent with  asthma, consider chronic upper airway irritation from 1 or multiple exacerbators.  We will need to try to identify contributors.  I will hold off on adding an ICS or powdered bronchodilator to avoid upper airway irritation  Dyspnea I am concerned the the etiology of his dyspnea is not obstructive lung disease.  He is tachypneic but is moving air adequately.  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  Could consider thyroid disease as well.  Additionally he has more constitutional symptoms including diarrhea that are concerning for an underlying unifying diagnosis.   I will obtain a CT scan of his chest and abdomen, check CBC, CMP, BNP.  He may need a screening echocardiogram going forward.  If the above evaluations are unrevealing, question whether he would require admission for further evaluation.  I will have him come back to see [place] this week after the tests are done so that we can plan next steps.  Baltazar Apo, MD, PhD 01/10/2018, 5:26 PM [facility] [phone] or if no answer [phone]

## 2018-01-10 NOTE — Assessment & Plan Note (Signed)
Inconsistent with asthma, consider chronic upper airway irritation from 1 or multiple exacerbators.  We will need to try to identify contributors.  I will hold off on adding an ICS or powdered bronchodilator to avoid upper airway irritation

## 2018-01-11 ENCOUNTER — Other Ambulatory Visit: Payer: Medicare Other

## 2018-01-11 ENCOUNTER — Telehealth: Payer: Self-pay | Admitting: Emergency Medicine

## 2018-01-11 ENCOUNTER — Ambulatory Visit: Payer: Medicare Other | Admitting: Family Medicine

## 2018-01-11 ENCOUNTER — Telehealth: Payer: Self-pay | Admitting: *Deleted

## 2018-01-11 ENCOUNTER — Other Ambulatory Visit: Payer: Self-pay | Admitting: Emergency Medicine

## 2018-01-11 DIAGNOSIS — R109 Unspecified abdominal pain: Secondary | ICD-10-CM

## 2018-01-11 NOTE — Telephone Encounter (Signed)
ATC patient and let him know we need to reschedule his appointment for tomorrow since his CT was not scheduled for today.   CT scheduled at 1100 and appt is here at 1200. Lesly Rubenstein from [place] is going to atc patient as well.   If patient calls back he needs to pick up contrast tonight by 9pm.  He is to drink one at 9am tomorrow and 10am tomorrow. He cannot have solids 4 hours prior to CT so no solids after 7am tomorrow 01/12/2018. He is to go to [address] location and register at 10:40.  Will leave open.Marland KitchenMarland Kitchen

## 2018-01-12 ENCOUNTER — Ambulatory Visit: Payer: Medicare Other | Admitting: Pulmonary Disease

## 2018-01-12 ENCOUNTER — Telehealth: Payer: Self-pay | Admitting: Emergency Medicine

## 2018-01-12 ENCOUNTER — Inpatient Hospital Stay (HOSPITAL_COMMUNITY)
Admission: EM | Admit: 2018-01-12 | Discharge: 2018-01-15 | DRG: 196 | Disposition: A | Discharge status: Home / Self Care | Payer: Medicare Other | Attending: Internal Medicine | Admitting: Internal Medicine

## 2018-01-12 ENCOUNTER — Ambulatory Visit (INDEPENDENT_AMBULATORY_CARE_PROVIDER_SITE_OTHER): Payer: Medicare Other | Admitting: Pulmonary Disease

## 2018-01-12 ENCOUNTER — Ambulatory Visit
Admission: RE | Admit: 2018-01-12 | Discharge: 2018-01-12 | Disposition: A | Discharge status: Home / Self Care | Payer: Medicare Other | Source: Ambulatory Visit | Attending: Emergency Medicine | Admitting: Emergency Medicine

## 2018-01-12 ENCOUNTER — Encounter: Payer: Self-pay | Admitting: Pulmonary Disease

## 2018-01-12 ENCOUNTER — Encounter (HOSPITAL_COMMUNITY): Payer: Self-pay | Admitting: Emergency Medicine

## 2018-01-12 ENCOUNTER — Emergency Department (HOSPITAL_COMMUNITY)
Admit: 2018-01-12 | Discharge: 2018-01-12 | Disposition: A | Discharge status: Home / Self Care | Payer: Medicare Other | Attending: Student | Admitting: Student

## 2018-01-12 ENCOUNTER — Other Ambulatory Visit: Payer: Self-pay | Admitting: Emergency Medicine

## 2018-01-12 ENCOUNTER — Inpatient Hospital Stay: Admit: 2018-01-12 | Payer: Self-pay | Admitting: Emergency Medicine

## 2018-01-12 ENCOUNTER — Other Ambulatory Visit: Payer: Self-pay

## 2018-01-12 DIAGNOSIS — J84112 Idiopathic pulmonary fibrosis: Secondary | ICD-10-CM | POA: Diagnosis not present

## 2018-01-12 DIAGNOSIS — I2782 Chronic pulmonary embolism: Secondary | ICD-10-CM | POA: Diagnosis not present

## 2018-01-12 DIAGNOSIS — C61 Malignant neoplasm of prostate: Secondary | ICD-10-CM | POA: Diagnosis present

## 2018-01-12 DIAGNOSIS — Z7951 Long term (current) use of inhaled steroids: Secondary | ICD-10-CM

## 2018-01-12 DIAGNOSIS — R109 Unspecified abdominal pain: Secondary | ICD-10-CM

## 2018-01-12 DIAGNOSIS — G2581 Restless legs syndrome: Secondary | ICD-10-CM | POA: Diagnosis present

## 2018-01-12 DIAGNOSIS — Z79899 Other long term (current) drug therapy: Secondary | ICD-10-CM

## 2018-01-12 DIAGNOSIS — N529 Male erectile dysfunction, unspecified: Secondary | ICD-10-CM | POA: Diagnosis not present

## 2018-01-12 DIAGNOSIS — R0600 Dyspnea, unspecified: Secondary | ICD-10-CM

## 2018-01-12 DIAGNOSIS — J8410 Pulmonary fibrosis, unspecified: Secondary | ICD-10-CM | POA: Diagnosis present

## 2018-01-12 DIAGNOSIS — E1151 Type 2 diabetes mellitus with diabetic peripheral angiopathy without gangrene: Secondary | ICD-10-CM | POA: Diagnosis present

## 2018-01-12 DIAGNOSIS — I351 Nonrheumatic aortic (valve) insufficiency: Secondary | ICD-10-CM | POA: Diagnosis not present

## 2018-01-12 DIAGNOSIS — I4891 Unspecified atrial fibrillation: Secondary | ICD-10-CM | POA: Diagnosis not present

## 2018-01-12 DIAGNOSIS — R918 Other nonspecific abnormal finding of lung field: Secondary | ICD-10-CM | POA: Diagnosis not present

## 2018-01-12 DIAGNOSIS — I2699 Other pulmonary embolism without acute cor pulmonale: Secondary | ICD-10-CM

## 2018-01-12 DIAGNOSIS — Z86711 Personal history of pulmonary embolism: Secondary | ICD-10-CM | POA: Diagnosis not present

## 2018-01-12 DIAGNOSIS — M797 Fibromyalgia: Secondary | ICD-10-CM | POA: Diagnosis present

## 2018-01-12 DIAGNOSIS — I2693 Single subsegmental pulmonary embolism without acute cor pulmonale: Secondary | ICD-10-CM | POA: Diagnosis not present

## 2018-01-12 DIAGNOSIS — Z7901 Long term (current) use of anticoagulants: Secondary | ICD-10-CM

## 2018-01-12 DIAGNOSIS — I4819 Other persistent atrial fibrillation: Secondary | ICD-10-CM | POA: Diagnosis not present

## 2018-01-12 DIAGNOSIS — Z8042 Family history of malignant neoplasm of prostate: Secondary | ICD-10-CM | POA: Diagnosis not present

## 2018-01-12 DIAGNOSIS — K219 Gastro-esophageal reflux disease without esophagitis: Secondary | ICD-10-CM | POA: Diagnosis present

## 2018-01-12 DIAGNOSIS — I272 Pulmonary hypertension, unspecified: Secondary | ICD-10-CM | POA: Diagnosis present

## 2018-01-12 DIAGNOSIS — R0603 Acute respiratory distress: Secondary | ICD-10-CM

## 2018-01-12 DIAGNOSIS — E785 Hyperlipidemia, unspecified: Secondary | ICD-10-CM | POA: Diagnosis not present

## 2018-01-12 DIAGNOSIS — I361 Nonrheumatic tricuspid (valve) insufficiency: Secondary | ICD-10-CM | POA: Diagnosis not present

## 2018-01-12 DIAGNOSIS — Z86718 Personal history of other venous thrombosis and embolism: Secondary | ICD-10-CM

## 2018-01-12 DIAGNOSIS — E669 Obesity, unspecified: Secondary | ICD-10-CM | POA: Diagnosis not present

## 2018-01-12 DIAGNOSIS — R0602 Shortness of breath: Secondary | ICD-10-CM | POA: Diagnosis not present

## 2018-01-12 DIAGNOSIS — I482 Chronic atrial fibrillation, unspecified: Secondary | ICD-10-CM | POA: Diagnosis present

## 2018-01-12 DIAGNOSIS — Z96612 Presence of left artificial shoulder joint: Secondary | ICD-10-CM | POA: Diagnosis present

## 2018-01-12 DIAGNOSIS — I739 Peripheral vascular disease, unspecified: Secondary | ICD-10-CM | POA: Diagnosis not present

## 2018-01-12 DIAGNOSIS — I10 Essential (primary) hypertension: Secondary | ICD-10-CM | POA: Diagnosis present

## 2018-01-12 DIAGNOSIS — M129 Arthropathy, unspecified: Secondary | ICD-10-CM | POA: Diagnosis not present

## 2018-01-12 DIAGNOSIS — Z9884 Bariatric surgery status: Secondary | ICD-10-CM

## 2018-01-12 DIAGNOSIS — J9601 Acute respiratory failure with hypoxia: Secondary | ICD-10-CM | POA: Diagnosis not present

## 2018-01-12 DIAGNOSIS — J45909 Unspecified asthma, uncomplicated: Secondary | ICD-10-CM | POA: Diagnosis present

## 2018-01-12 DIAGNOSIS — Z96653 Presence of artificial knee joint, bilateral: Secondary | ICD-10-CM | POA: Diagnosis present

## 2018-01-12 DIAGNOSIS — Z8049 Family history of malignant neoplasm of other genital organs: Secondary | ICD-10-CM | POA: Diagnosis not present

## 2018-01-12 LAB — CBC WITH DIFFERENTIAL/PLATELET
Abs Immature Granulocytes: 0.05 10*3/uL (ref 0.00–0.07)
BASOS ABS: 0.1 10*3/uL (ref 0.0–0.1)
Basophils Relative: 1 %
Eosinophils Absolute: 0.5 10*3/uL (ref 0.0–0.5)
Eosinophils Relative: 5 %
HEMATOCRIT: 35.6 % — AB (ref 39.0–52.0)
Hemoglobin: 11.2 g/dL — ABNORMAL LOW (ref 13.0–17.0)
Immature Granulocytes: 1 %
LYMPHS ABS: 1.4 10*3/uL (ref 0.7–4.0)
Lymphocytes Relative: 14 %
MCH: 29.3 pg (ref 26.0–34.0)
MCHC: 31.5 g/dL (ref 30.0–36.0)
MCV: 93.2 fL (ref 80.0–100.0)
Monocytes Absolute: 1.2 10*3/uL — ABNORMAL HIGH (ref 0.1–1.0)
Monocytes Relative: 13 %
Neutro Abs: 6.4 10*3/uL (ref 1.7–7.7)
Neutrophils Relative %: 66 %
Platelets: 309 10*3/uL (ref 150–400)
RBC: 3.82 MIL/uL — ABNORMAL LOW (ref 4.22–5.81)
RDW: 13.3 % (ref 11.5–15.5)
WBC: 9.7 10*3/uL (ref 4.0–10.5)
nRBC: 0 % (ref 0.0–0.2)

## 2018-01-12 LAB — PROTIME-INR
INR: 2.75
Prothrombin Time: 28.7 seconds — ABNORMAL HIGH (ref 11.4–15.2)

## 2018-01-12 LAB — COMPREHENSIVE METABOLIC PANEL
ALT: 14 U/L (ref 0–44)
ANION GAP: 9 (ref 5–15)
AST: 24 U/L (ref 15–41)
Albumin: 3 g/dL — ABNORMAL LOW (ref 3.5–5.0)
Alkaline Phosphatase: 74 U/L (ref 38–126)
BILIRUBIN TOTAL: 0.5 mg/dL (ref 0.3–1.2)
BUN: 26 mg/dL — ABNORMAL HIGH (ref 8–23)
CO2: 26 mmol/L (ref 22–32)
Calcium: 8.3 mg/dL — ABNORMAL LOW (ref 8.9–10.3)
Chloride: 100 mmol/L (ref 98–111)
Creatinine, Ser: 1.46 mg/dL — ABNORMAL HIGH (ref 0.61–1.24)
GFR calc Af Amer: 53 mL/min — ABNORMAL LOW (ref >60)
GFR calc non Af Amer: 46 mL/min — ABNORMAL LOW (ref >60)
GLUCOSE: 140 mg/dL — AB (ref 70–99)
Potassium: 4.6 mmol/L (ref 3.5–5.1)
Sodium: 135 mmol/L (ref 135–145)
TOTAL PROTEIN: 6.7 g/dL (ref 6.5–8.1)

## 2018-01-12 LAB — INFLUENZA PANEL BY PCR (TYPE A & B)
Influenza A By PCR: NEGATIVE
Influenza B By PCR: NEGATIVE

## 2018-01-12 MED ORDER — ACARBOSE 25 MG PO TABS
50.0000 mg | ORAL_TABLET | Freq: Three times a day (TID) | ORAL | Status: DC
  Administered 2018-01-13 – 2018-01-15 (×8): 50 mg via ORAL
  Filled 2018-01-12 (×9): qty 2

## 2018-01-12 MED ORDER — WARFARIN SODIUM 3 MG PO TABS
3.5000 mg | ORAL_TABLET | Freq: Once | ORAL | Status: AC
  Administered 2018-01-12: 3.5 mg via ORAL
  Filled 2018-01-12 (×2): qty 1

## 2018-01-12 MED ORDER — ACETAMINOPHEN 650 MG RE SUPP: 650.0000 mg | Freq: Four times a day (QID) | RECTAL | Status: DC | PRN

## 2018-01-12 MED ORDER — ATORVASTATIN CALCIUM 40 MG PO TABS
80.0000 mg | ORAL_TABLET | Freq: Every day | ORAL | Status: DC
  Administered 2018-01-12 – 2018-01-14 (×3): 80 mg via ORAL
  Filled 2018-01-12 (×3): qty 2

## 2018-01-12 MED ORDER — ACETAMINOPHEN 325 MG PO TABS: 650.0000 mg | ORAL_TABLET | Freq: Four times a day (QID) | ORAL | Status: DC | PRN

## 2018-01-12 MED ORDER — ZOLPIDEM TARTRATE 5 MG PO TABS
5.0000 mg | ORAL_TABLET | Freq: Every evening | ORAL | Status: DC | PRN
  Administered 2018-01-12 – 2018-01-14 (×3): 5 mg via ORAL
  Filled 2018-01-12 (×3): qty 1

## 2018-01-12 MED ORDER — FLUOXETINE HCL 20 MG PO CAPS
60.0000 mg | ORAL_CAPSULE | Freq: Every day | ORAL | Status: DC
  Administered 2018-01-12 – 2018-01-14 (×3): 60 mg via ORAL
  Filled 2018-01-12 (×3): qty 3

## 2018-01-12 MED ORDER — FLUTICASONE FUROATE-VILANTEROL 200-25 MCG/INH IN AEPB
1.0000 | INHALATION_SPRAY | Freq: Every day | RESPIRATORY_TRACT | Status: DC
  Administered 2018-01-13 – 2018-01-15 (×3): 1 via RESPIRATORY_TRACT
  Filled 2018-01-12: qty 28

## 2018-01-12 MED ORDER — GABAPENTIN 300 MG PO CAPS
300.0000 mg | ORAL_CAPSULE | Freq: Two times a day (BID) | ORAL | Status: DC
  Administered 2018-01-12 – 2018-01-15 (×6): 300 mg via ORAL
  Filled 2018-01-12 (×6): qty 1

## 2018-01-12 MED ORDER — PANTOPRAZOLE SODIUM 40 MG PO TBEC
40.0000 mg | DELAYED_RELEASE_TABLET | Freq: Every day | ORAL | Status: DC
  Administered 2018-01-12 – 2018-01-15 (×4): 40 mg via ORAL
  Filled 2018-01-12 (×4): qty 1

## 2018-01-12 MED ORDER — SUCRALFATE 1 G PO TABS
1.0000 g | ORAL_TABLET | Freq: Three times a day (TID) | ORAL | Status: DC
  Administered 2018-01-13 – 2018-01-15 (×8): 1 g via ORAL
  Filled 2018-01-12 (×8): qty 1

## 2018-01-12 MED ORDER — HYDROCOD POLST-CPM POLST ER 10-8 MG/5ML PO SUER
1.5000 mL | Freq: Two times a day (BID) | ORAL | Status: DC | PRN
  Administered 2018-01-12 – 2018-01-13 (×2): 1.5 mL via ORAL
  Filled 2018-01-12 (×2): qty 5

## 2018-01-12 MED ORDER — WARFARIN - PHARMACIST DOSING INPATIENT: Freq: Every day | Status: DC

## 2018-01-12 MED ORDER — IPRATROPIUM-ALBUTEROL 0.5-2.5 (3) MG/3ML IN SOLN: 3.0000 mL | RESPIRATORY_TRACT | Status: DC | PRN

## 2018-01-12 MED ORDER — METHYLPREDNISOLONE SODIUM SUCC 125 MG IJ SOLR
60.0000 mg | Freq: Four times a day (QID) | INTRAMUSCULAR | Status: DC
  Administered 2018-01-12 – 2018-01-14 (×7): 60 mg via INTRAVENOUS
  Filled 2018-01-12 (×7): qty 2

## 2018-01-12 MED ORDER — ONDANSETRON HCL 4 MG PO TABS: 4.0000 mg | ORAL_TABLET | Freq: Four times a day (QID) | ORAL | Status: DC | PRN

## 2018-01-12 MED ORDER — TIZANIDINE HCL 4 MG PO TABS: 4.0000 mg | ORAL_TABLET | Freq: Four times a day (QID) | ORAL | Status: DC | PRN

## 2018-01-12 MED ORDER — OMEGA-3-ACID ETHYL ESTERS 1 G PO CAPS
1.0000 g | ORAL_CAPSULE | Freq: Every day | ORAL | Status: DC
  Administered 2018-01-13 – 2018-01-15 (×3): 1 g via ORAL
  Filled 2018-01-12 (×3): qty 1

## 2018-01-12 MED ORDER — TAMSULOSIN HCL 0.4 MG PO CAPS
0.4000 mg | ORAL_CAPSULE | Freq: Every day | ORAL | Status: DC
  Administered 2018-01-12 – 2018-01-14 (×3): 0.4 mg via ORAL
  Filled 2018-01-12 (×3): qty 1

## 2018-01-12 MED ORDER — ONDANSETRON HCL 4 MG/2ML IJ SOLN: 4.0000 mg | Freq: Four times a day (QID) | INTRAMUSCULAR | Status: DC | PRN

## 2018-01-12 MED ORDER — IOPAMIDOL (ISOVUE-370) INJECTION 76%
100.0000 mL | Freq: Once | INTRAVENOUS | Status: AC | PRN
  Administered 2018-01-12: 100 mL via INTRAVENOUS

## 2018-01-12 MED ORDER — HEPARIN (PORCINE) 25000 UT/250ML-% IV SOLN
1000.0000 [IU]/h | INTRAVENOUS | Status: DC
  Filled 2018-01-12: qty 250

## 2018-01-12 MED ORDER — FAMOTIDINE 20 MG PO TABS
20.0000 mg | ORAL_TABLET | Freq: Every day | ORAL | Status: DC
  Administered 2018-01-12 – 2018-01-15 (×4): 20 mg via ORAL
  Filled 2018-01-12 (×5): qty 1

## 2018-01-12 MED ORDER — IPRATROPIUM-ALBUTEROL 0.5-2.5 (3) MG/3ML IN SOLN
3.0000 mL | Freq: Three times a day (TID) | RESPIRATORY_TRACT | Status: DC
  Administered 2018-01-12 – 2018-01-15 (×9): 3 mL via RESPIRATORY_TRACT
  Filled 2018-01-12 (×9): qty 3

## 2018-01-12 MED ORDER — VITAMIN E 180 MG (400 UNIT) PO CAPS
400.0000 [IU] | ORAL_CAPSULE | Freq: Every day | ORAL | Status: DC
  Administered 2018-01-13 – 2018-01-15 (×3): 400 [IU] via ORAL
  Filled 2018-01-12 (×3): qty 1

## 2018-01-12 NOTE — ED Notes (Signed)
Per Suzanne Boron, MD do not give heparin.

## 2018-01-12 NOTE — Telephone Encounter (Signed)
Received call report from Wells Guiles at [facility] in regards to pt's CT abdomen/pelvis and CTA.  IMPRESSION: 1. Tiny segmental to subsegmental pulmonary embolus in the left upper lobe with potential miniscule focus of embolism in a distal pulmonary artery to the lingula. 2. Advanced interstitial, basilar predominant fibrotic lung disease. Scattered areas of ground-glass attenuation are associated with bronchiectasis. 3. Enlargement of the main pulmonary arteries suggests pulmonary arterial hypertension. 4. Asymmetric irregular wall thickening noted distal esophagus. Distal esophageal neoplasm a concern. 5. Mild mediastinal and hilar lymphadenopathy, possibly reactive. In the setting of neoplasm, metastatic disease would be a concern. 6. 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 7. Status post gastric bypass.  Per Wells Guiles, pt had an appt at our office at 12pm which I saw that he had the appt with Wyn Quaker.  I stated to Wells Guiles that I would let Aaron Edelman know.  Dineen Kid to let him know that we did receive the call report and that I was in the process of getting message finished so I could send it to him. Per Aaron Edelman, he had already seen the reports of the scans. In the [place] pt had with Sharyn Lull stated that they were going to admit pt to [facility] for treatment.  Routing to Dr. Lamonte Sakai and also Aaron Edelman, since Aaron Edelman is the one pt saw, as an FYI.

## 2018-01-12 NOTE — Telephone Encounter (Signed)
Patient came in for 0915 appt today. I instructed him to come back at 1200 after his CT at 11.   Nothing further needed at this time.

## 2018-01-12 NOTE — ED Triage Notes (Signed)
Pt was having SOB and had CT scan today which showed PE and spot on esophagus. Pt hasnt started chemo/radiation yet for cancer.

## 2018-01-12 NOTE — ED Notes (Addendum)
Report given to Josph Macho, Therapist, sports. Transport called.

## 2018-01-12 NOTE — ED Notes (Signed)
Patient given sandwich, crackers, and generales. MD made aware.

## 2018-01-12 NOTE — H&P (Signed)
History and Physical    [name] RWE:[id number] DOB: 04-Mar-1940 DOA: 01/12/2018  PCP: Jinny Sanders, MD   Patient coming from: Home   Chief Complaint: Dyspnea.   HPI: [name] is a 77 y.o. male with medical history significant of hypertension, type 2 diabetes mellitus, remote history of DVT/PE, childhood asthma, recently diagnosed with prostate cancer.  Patient presents with 5 weeks of worsening dyspnea, which has rapidly progressed over the last 7 days, to a severe intensity, to the point where he is dyspneic at rest and has difficulty speaking, it has been associated with dry cough, occasional wheezing and chills, no increased sputum production, no improving or worsening factors.  No PND, orthopnea lower extremity edema.  His symptoms started with a flulike illness that rapidly progressed into dyspnea.  Before his symptoms started he was functional and independent, no limitations in physical exertion.  Occasional cough and dyspnea self resolved.  Never smoked cigarettes.  He has been treated as an outpatient with multiple antibiotics, and inhalers, with no improvement of his symptoms.  ED Course: Patient was deconditioned and ill looking appearing, dyspneic, he received oxygen, per nasal cannula.  For further admission for evaluation.  Review of Systems:  1. General: No fevers, but chills, no weight gain or weight loss 2. ENT: No runny nose or sore throat, no hearing disturbances 3. Pulmonary: Positive dyspnea, dry cough, wheezing, but no hemoptysis 4. Cardiovascular: No angina, claudication, lower extremity edema, pnd or orthopnea 5. Gastrointestinal: No nausea or vomiting, no diarrhea or constipation 6. Hematology: No easy bruisability or frequent infections 7. Urology: No dysuria, hematuria or increased urinary frequency 8. Dermatology: No rashes. 9. Neurology: No seizures or paresthesias 10. Musculoskeletal: No joint pain or deformities  Past Medical History:    Diagnosis Date  . Arthritis    hands   . Asthma   . Chronic atrial fibrillation    a.fib on warfarin- Dr. Percival Spanish follows  . Diverticulosis of colon (without mention of hemorrhage)   . Dysrhythmia    a fib  . ED (erectile dysfunction)   . Fibromyalgia   . GERD (gastroesophageal reflux disease)   . Hard of hearing    wears hearing aids  . Headache   . History of blood clots    behind right knee and then went into left lung 28yrs ago  . History of colon polyps   . History of kidney stones   . Hx of diabetes mellitus    "no longer diabetic since gastric bypass" - no longer taking metformin"  in 2 months "problems with low blood sugar now"  . Hx of gout    but doesn't take any meds  . Inflammation of colonic mucosa    recent admission and release from [facility] 02-28-15-remains on oral antibiotic  . Obesity   . Peripheral vascular disease ([place])   . Pneumonia    last time about 29yrs ago  . Prostate cancer ([place])   . Pulmonary embolism ([place])    over 10 yrs ago "many years ago"  . Restless legs syndrome (RLS)   . Skin cancer   . Unspecified asthma(493.90)    as a child  . Unspecified essential hypertension    hx of no longer on medication due to gastric bypass     Past Surgical History:  Procedure Laterality Date  . ANTERIOR CERVICAL DECOMP/DISCECTOMY FUSION N/A 05/09/2013   Procedure: ANTERIOR CERVICAL DECOMPRESSION/DISCECTOMY FUSION CERVICAL THREE-FOUR,CERVICAL FOUR-FIVE,CERVICAL FIVE-SIX;  Surgeon: Floyce Stakes, MD;  Location: [facility];  Service: Neurosurgery;  Laterality: N/A;  . ARTERY REPAIR     Left forearm  . BACK SURGERY     x 3  . BREATH TEK H PYLORI  01/08/2011   Procedure: BREATH TEK H PYLORI;  Surgeon: Pedro Earls, MD;  Location: [facility];  Service: General;  Laterality: N/A;  . CARDIOVERSION     x 2 attempts-unsuccessful.  [name] CATARACT EXTRACTION, BILATERAL    . COLONOSCOPY    . CYSTOSCOPY    . ESOPHAGOGASTRODUODENOSCOPY (EGD) WITH PROPOFOL N/A  09/21/2014   Procedure: ESOPHAGOGASTRODUODENOSCOPY (EGD) WITH PROPOFOL;  Surgeon: Manya Silvas, MD;  Location[facility];  Service: Endoscopy;  Laterality: N/A;  . EYE SURGERY     cataract bil  . FOOT SURGERY     Left foot   . GASTRIC ROUX-EN-Y  08/11/2011   Procedure: LAPAROSCOPIC ROUX-EN-Y GASTRIC BYPASS WITH UPPER ENDOSCOPY;  Surgeon: Pedro Earls, MD;  Location: [facility];  Service: General;  Laterality: N/A;  . HAND SURGERY     LEFT  . injections in back     x 18  . JOINT REPLACEMENT    . KNEE SURGERY Left    x 4  . KNEE SURGERY Left    arthroscopy  . LAPAROSCOPIC INTERNAL HERNIA REPAIR N/A 03/08/2015   Procedure: LAPAROSCOPIC INTERNAL HERNIA REPAIR ;  Surgeon: [name], MD;  Location: [facility];  Service: General;  Laterality: N/A;  . LEG SURGERY     FOR NECROTIZING FASCITIS L LEG AND GROIN  . PANNICULECTOMY N/A 08/25/2016   Procedure: PANNICULECTOMY;  Surgeon: [name], MD;  Location: [facility];  Service: General;  Laterality: N/A;  . PENILE PROSTHESIS IMPLANT N/A 07/02/2014   Procedure: PENILE PROTHESIS INFLATABLE 3 PIECE (COLOPLAST) SCROTAL APPROACH;  Surgeon: Kathie Rhodes, MD;  Location: [facility];  Service: Urology;  Laterality: N/A;  . PENILE PROSTHESIS IMPLANT N/A 11/23/2014   Procedure: EXPLORATION AND REVISION OF PENILE PROSTHESIS;  Surgeon: Kathie Rhodes, MD;  Location: [facility];  Service: Urology;  Laterality: N/A;  . PICC INSERTION W/OUT PORT/PUMP    . REPLACEMENT TOTAL KNEE Left    x 2  . SHOULDER ARTHROSCOPY    . SHOULDER SURGERY Left 2014  . TONSILLECTOMY    . TOTAL KNEE ARTHROPLASTY Right 04/27/2017  . TOTAL KNEE ARTHROPLASTY Right 04/27/2017   Procedure: TOTAL KNEE ARTHROPLASTY;  Surgeon: Melrose Nakayama, MD;  Location: [facility];  Service: Orthopedics;  Laterality: Right;  . TOTAL SHOULDER ARTHROPLASTY Left 01/26/2013   Procedure: TOTAL SHOULDER ARTHROPLASTY;  Surgeon: Nita Sells, MD;  Location: [place];  Service: Orthopedics;  Laterality: Left;   Left total shoulder arthroplasty     reports that he has never smoked. He has never used smokeless tobacco. He reports that he does not drink alcohol or use drugs.  Allergies  Allergen Reactions  . Sulfacetamide Sodium Swelling    throat swelling  . Latex Itching    Severe contact dermatitis  . Adhesive [Tape] Other (See Comments)    Tears skin-- "No tape of any kind, they all tear skin right off"    Family History  Problem Relation Age of Onset  . Uterine cancer Mother        mets  . Breast cancer Mother   . Cancer Mother        cervical  . Emphysema Father   . Alzheimer's disease Sister   . Prostate cancer Brother   . Heart attack Brother   .  Colon cancer Brother 62     Prior to Admission medications   Medication Sig Start Date End Date Taking? Authorizing Provider  acarbose (PRECOSE) 50 MG tablet Take 50 mg by mouth 3 (three) times daily with meals.   Yes [provider]  atorvastatin (LIPITOR) 80 MG tablet Take 1 tablet (80 mg total) by mouth at bedtime. 02/18/17  Yes Minus Breeding, MD  chlorpheniramine-HYDROcodone (TUSSIONEX) 10-8 MG/5ML SUER Take 5 mLs by mouth every 12 (twelve) hours as needed for cough. Patient taking differently: Take 1.5 mLs by mouth every 12 (twelve) hours as needed for cough.  01/05/18  Yes Lesleigh Noe, MD  FLUoxetine (PROZAC) 20 MG capsule TAKE 3 CAPSULES BY MOUTH EVERYDAY AT BEDTIME Patient taking differently: Take 60 mg by mouth at bedtime.  01/10/18  Yes Bedsole, Amy E, MD  Fluticasone-Salmeterol (ADVAIR DISKUS) 250-50 MCG/DOSE AEPB Inhale 1 puff into the lungs 2 (two) times daily. Patient taking differently: Inhale 1 puff into the lungs 2 (two) times daily as needed (shortness of breath).  12/21/17  Yes Bedsole, Amy E, MD  furosemide (LASIX) 20 MG tablet Take 1 tablet (20 mg total) by mouth every other day. Patient taking differently: Take 20 mg by mouth daily.  08/10/17  Yes Duke, Tami Lin, PA  gabapentin (NEURONTIN) 300 MG  capsule TAKE 1 CAPSULE BY MOUTH TWICE A DAY Patient taking differently: Take 300 mg by mouth 2 (two) times daily.  08/19/17  Yes Tat, Eustace Quail, DO  hydrocortisone 2.5 % cream Apply 1 application topically 2 (two) times daily as needed (itching).  12/09/17  Yes [provider]  hydroxypropyl methylcellulose / hypromellose (ISOPTO TEARS / GONIOVISC) 2.5 % ophthalmic solution Place 1 drop into both eyes 2 (two) times daily as needed for dry eyes.   Yes [provider]  ipratropium-albuterol (DUONEB) 0.5-2.5 (3) MG/3ML SOLN Take 3 mLs by nebulization every 4 (four) hours as needed. Patient taking differently: Take 3 mLs by nebulization 3 (three) times daily.  01/05/18  Yes Lesleigh Noe, MD  Omega-3 Fatty Acids (FISH OIL) 1000 MG CAPS Take 1,000 mg by mouth daily.   Yes [provider]  omeprazole (PRILOSEC) 40 MG capsule TAKE 1 CAPSULE (40 MG TOTAL) BY MOUTH DAILY. Patient taking differently: Take 40 mg by mouth at bedtime.  11/11/17  Yes Bedsole, Amy E, MD  PRESCRIPTION MEDICATION Apply 1 g topically 4 (four) times daily as needed (pain). Baclofen/gabapentin/lidocaine compounded cream. Applies 1g (1pump) to affected area up to four times a day for   Yes [provider]  primidone (MYSOLINE) 50 MG tablet 150 mg in the morning, 100 mg at night Patient taking differently: Take 100-150 mg by mouth See admin instructions. 150 mg in the morning, 100 mg at night 03/08/17  Yes Tat, Eustace Quail, DO  ranitidine (ZANTAC) 300 MG tablet Take 300 mg by mouth at bedtime.  12/03/17  Yes [provider]  sucralfate (CARAFATE) 1 g tablet Take 1 g by mouth 3 (three) times daily before meals.  12/21/17  Yes [provider]  tamsulosin (FLOMAX) 0.4 MG CAPS capsule Take 0.4 mg by mouth at bedtime.  01/03/18  Yes [provider]  tiZANidine (ZANAFLEX) 4 MG tablet Take 4 mg by mouth every 6 (six) hours as needed for muscle spasms.  12/20/17  Yes [provider]  vitamin E 400 UNIT capsule Take 400 Units by mouth daily.   Yes [provider]  warfarin (COUMADIN) 2.5 MG tablet TAKE  1 TO 1 AND 1/2 TABLETS DAILY AS DIRECTED BY COUMADIN CLINIC Patient taking differently: Take 2.5-3.75 mg by mouth See admin instructions. 2.5mg  on Sunday and Thursday. 3.75mg  on all other days. 11/16/17  Yes Minus Breeding, MD  zolpidem (AMBIEN CR) 12.5 MG CR tablet Take 12.5 mg by mouth at bedtime.   Yes [provider]    Physical Exam: Vitals:   01/12/18 1441 01/12/18 1445 01/12/18 1515 01/12/18 1555  BP: 113/80 113/80  126/79  Pulse: 78 88 91 99  Resp: (!) 21 (!) 26 (!) 21 20  Temp:      TempSrc:      SpO2: 98% 96% 100% 98%    Vitals:   01/12/18 1441 01/12/18 1445 01/12/18 1515 01/12/18 1555  BP: 113/80 113/80  126/79  Pulse: 78 88 91 99  Resp: (!) 21 (!) 26 (!) 21 20  Temp:      TempSrc:      SpO2: 98% 96% 100% 98%   General: deconditioned, ill looking appearing, dyspneic.  Neurology: Awake and alert, non focal Head and Neck. Head normocephalic. Neck supple with no adenopathy or thyromegaly.   E ENT: positive pallor, no icterus, oral mucosa moist Cardiovascular: No JVD. S1-S2 present, rhythmic, no gallops, rubs, or murmurs. No lower extremity edema. Pulmonary: decreased breath sounds bilaterally, poor air movement, no wheezing or rhonchi, positive bibasilar rales. Gastrointestinal. Abdomen with no organomegaly, non tender, no rebound or guarding Skin. No rashes Musculoskeletal: no joint deformities    Labs on Admission: I have personally reviewed following labs and imaging studies  CBC: Recent Labs  Lab 01/10/18 1657 01/12/18 1508  WBC 13.8* 9.7  NEUTROABS 10.7* 6.4  HGB 13.1 11.2*  HCT 39.8 35.6*  MCV 90.9 93.2  PLT 340.0 161   Basic Metabolic Panel: Recent Labs  Lab 01/10/18 1657 01/12/18 1508  NA 134* 135  K 4.2 4.6  CL 99 100  CO2 24 26  GLUCOSE 191* 140*  BUN 22 26*  CREATININE 1.40 1.46*  CALCIUM 8.7  8.3*   GFR: Estimated Creatinine Clearance: 47.2 mL/min (A) (by C-G formula based on SCr of 1.46 mg/dL (H)). Liver Function Tests: Recent Labs  Lab 01/10/18 1657 01/12/18 1508  AST 20 24  ALT 13 14  ALKPHOS 93 74  BILITOT 0.4 0.5  PROT 7.0 6.7  ALBUMIN 3.6 3.0*   No results for input(s): LIPASE, AMYLASE in the last 168 hours. No results for input(s): AMMONIA in the last 168 hours. Coagulation Profile: Recent Labs  Lab 01/12/18 1508  INR 2.75   Cardiac Enzymes: No results for input(s): CKTOTAL, CKMB, CKMBINDEX, TROPONINI in the last 168 hours. BNP (last 3 results) Recent Labs    01/10/18 1657  PROBNP 123.0*   HbA1C: No results for input(s): HGBA1C in the last 72 hours. CBG: No results for input(s): GLUCAP in the last 168 hours. Lipid Profile: No results for input(s): CHOL, HDL, LDLCALC, TRIG, CHOLHDL, LDLDIRECT in the last 72 hours. Thyroid Function Tests: No results for input(s): TSH, T4TOTAL, FREET4, T3FREE, THYROIDAB in the last 72 hours. Anemia Panel: No results for input(s): VITAMINB12, FOLATE, FERRITIN, TIBC, IRON, RETICCTPCT in the last 72 hours. Urine analysis:    Component Value Date/Time   COLORURINE YELLOW 06/03/2017 1402   APPEARANCEUR CLEAR 06/03/2017 1402   LABSPEC 1.013 06/03/2017 1402   PHURINE 5.0 06/03/2017 1402   GLUCOSEU NEGATIVE 06/03/2017 1402   HGBUR NEGATIVE 06/03/2017 1402   HGBUR small 11/05/2009 1419   BILIRUBINUR NEGATIVE 06/03/2017 1402  KETONESUR NEGATIVE 06/03/2017 1402   PROTEINUR NEGATIVE 06/03/2017 1402   UROBILINOGEN 0.2 01/19/2013 0930   NITRITE NEGATIVE 06/03/2017 1402   LEUKOCYTESUR TRACE (A) 06/03/2017 1402    Radiological Exams on Admission: Ct Angio Chest W/cm &/or Wo Cm  Result Date: 01/12/2018 CLINICAL DATA:  Cough with dyspnea and wheezing. New diagnosis of prostate cancer. Abdominal pain. EXAM: CT ANGIOGRAPHY CHEST CT ABDOMEN AND PELVIS WITH CONTRAST TECHNIQUE: Multidetector CT imaging of the chest was  performed using the standard protocol during bolus administration of intravenous contrast. Multiplanar CT image reconstructions and MIPs were obtained to evaluate the vascular anatomy. Multidetector CT imaging of the abdomen and pelvis was performed using the standard protocol during bolus administration of intravenous contrast. CONTRAST:  124mL ISOVUE-370 IOPAMIDOL (ISOVUE-370) INJECTION 76% COMPARISON:  Pelvic CT 10/25/2017. FINDINGS: CTA CHEST FINDINGS Cardiovascular: Heart size upper normal. Coronary artery calcification is evident. Atherosclerotic calcification is noted in the wall of the thoracic aorta. Right main pulmonary artery measures 3.3 cm diameter. Left main pulmonary artery measures up to 2.7 cm diameter. Pulmonary arteries are well opacified. No right-sided filling defects to suggest acute pulmonary embolus. Patient does have a single small focus of segmental to subsegmental pulmonary embolus in the left upper lobe (image 142/series 11). Potential 3-4 mm embolic focus identified in distal pulmonary artery to the lingula (image 203/series 11). No other left-sided pulmonary embolic disease. Mediastinum/Nodes: Upper normal to mild mediastinal lymphadenopathy evident. 11 mm short axis right paratracheal node is partially obscured by beam hardening artifact from contrast in the SVC. The the the 14 mm AP window lymph node is visible on 45/4. Small lymph nodes evident in each hilum with 12 mm short axis inferior right hilar lymph node evident. Probable irregular non circumferential wall thickening in the distal esophagus (57/4) There is no axillary lymphadenopathy. Lungs/Pleura: The central tracheobronchial airways are patent. Basilar predominant interstitial opacity noted with subpleural reticulation, alveolar ground-glass opacity, and diffuse bronchiectasis. Areas of peripheral patchy ground-glass attenuation are seen in the upper lungs bilaterally. This ground-glass attenuation is associated with  bronchiectasis. No pleural effusion. Musculoskeletal: No worrisome lytic or sclerotic osseous abnormality. Patient is status post left shoulder replacement. Review of the MIP images confirms the above findings. CT ABDOMEN and PELVIS FINDINGS Hepatobiliary: No focal abnormality within the liver parenchyma. Gallbladder is distended. No intrahepatic or extrahepatic biliary dilation. Pancreas: No focal mass lesion. No dilatation of the main duct. No intraparenchymal cyst. No peripancreatic edema. Spleen: Wedge-shaped area of decreased perfusion in the the peripheral spleen suggests infarct, age indeterminate. Adrenals/Urinary Tract: No adrenal nodule or mass. 4 cm cyst identified lower pole right kidney. 8 mm well-defined low-density lesion in the lower interpolar region of the left kidney is too small to characterize but likely a cyst. No evidence for hydroureter. The urinary bladder appears normal for the degree of distention. Stomach/Bowel: Status post gastric bypass. Duodenum is normally positioned as is the ligament of Treitz. No small bowel wall thickening. No small bowel dilatation. The terminal ileum is normal. The appendix is not visualized, but there is no edema or inflammation in the region of the cecum. Diverticular changes are noted in the left colon without evidence of diverticulitis. Vascular/Lymphatic: There is abdominal aortic atherosclerosis without aneurysm. There is no gastrohepatic or hepatoduodenal ligament lymphadenopathy. No intraperitoneal or retroperitoneal lymphadenopathy. No pelvic sidewall lymphadenopathy. Reproductive: Prostate gland unremarkable. Penile prosthesis evident with irregular contour of the reservoir. Reservoir rupture not excluded. Other: No intraperitoneal free fluid. Musculoskeletal: Bilateral SI joint effusion evident. Lower  lumbar fusion hardware associated. Patient is status post vertebral augmentation at L1. Review of the MIP images confirms the above findings.  IMPRESSION: 1. Tiny segmental to subsegmental pulmonary embolus in the left upper lobe with potential miniscule focus of embolism in a distal pulmonary artery to the lingula. 2. Advanced interstitial, basilar predominant fibrotic lung disease. Scattered areas of ground-glass attenuation are associated with bronchiectasis. 3. Enlargement of the main pulmonary arteries suggests pulmonary arterial hypertension. 4. Asymmetric irregular wall thickening noted distal esophagus. Distal esophageal neoplasm a concern. 5. Mild mediastinal and hilar lymphadenopathy, possibly reactive. In the setting of neoplasm, metastatic disease would be a concern. 6. 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 7. Status post gastric bypass. These results will be called to the ordering clinician or representative by the Radiologist Assistant, and communication documented in the PACS or zVision Dashboard. Electronically Signed   By: Misty Stanley M.D.   On: 01/12/2018 12:25   Ct Abdomen Pelvis W Contrast  Result Date: 01/12/2018 CLINICAL DATA:  Cough with dyspnea and wheezing. New diagnosis of prostate cancer. Abdominal pain. EXAM: CT ANGIOGRAPHY CHEST CT ABDOMEN AND PELVIS WITH CONTRAST TECHNIQUE: Multidetector CT imaging of the chest was performed using the standard protocol during bolus administration of intravenous contrast. Multiplanar CT image reconstructions and MIPs were obtained to evaluate the vascular anatomy. Multidetector CT imaging of the abdomen and pelvis was performed using the standard protocol during bolus administration of intravenous contrast. CONTRAST:  148mL ISOVUE-370 IOPAMIDOL (ISOVUE-370) INJECTION 76% COMPARISON:  Pelvic CT 10/25/2017. FINDINGS: CTA CHEST FINDINGS Cardiovascular: Heart size upper normal. Coronary artery calcification is evident. Atherosclerotic calcification is noted in the wall of the thoracic aorta. Right main pulmonary artery measures 3.3  cm diameter. Left main pulmonary artery measures up to 2.7 cm diameter. Pulmonary arteries are well opacified. No right-sided filling defects to suggest acute pulmonary embolus. Patient does have a single small focus of segmental to subsegmental pulmonary embolus in the left upper lobe (image 142/series 11). Potential 3-4 mm embolic focus identified in distal pulmonary artery to the lingula (image 203/series 11). No other left-sided pulmonary embolic disease. Mediastinum/Nodes: Upper normal to mild mediastinal lymphadenopathy evident. 11 mm short axis right paratracheal node is partially obscured by beam hardening artifact from contrast in the SVC. The the the 14 mm AP window lymph node is visible on 45/4. Small lymph nodes evident in each hilum with 12 mm short axis inferior right hilar lymph node evident. Probable irregular non circumferential wall thickening in the distal esophagus (57/4) There is no axillary lymphadenopathy. Lungs/Pleura: The central tracheobronchial airways are patent. Basilar predominant interstitial opacity noted with subpleural reticulation, alveolar ground-glass opacity, and diffuse bronchiectasis. Areas of peripheral patchy ground-glass attenuation are seen in the upper lungs bilaterally. This ground-glass attenuation is associated with bronchiectasis. No pleural effusion. Musculoskeletal: No worrisome lytic or sclerotic osseous abnormality. Patient is status post left shoulder replacement. Review of the MIP images confirms the above findings. CT ABDOMEN and PELVIS FINDINGS Hepatobiliary: No focal abnormality within the liver parenchyma. Gallbladder is distended. No intrahepatic or extrahepatic biliary dilation. Pancreas: No focal mass lesion. No dilatation of the main duct. No intraparenchymal cyst. No peripancreatic edema. Spleen: Wedge-shaped area of decreased perfusion in the the peripheral spleen suggests infarct, age indeterminate. Adrenals/Urinary Tract: No adrenal nodule or mass. 4  cm cyst identified lower pole right kidney. 8 mm well-defined low-density lesion in the lower interpolar region of the left kidney is too small to characterize but  likely a cyst. No evidence for hydroureter. The urinary bladder appears normal for the degree of distention. Stomach/Bowel: Status post gastric bypass. Duodenum is normally positioned as is the ligament of Treitz. No small bowel wall thickening. No small bowel dilatation. The terminal ileum is normal. The appendix is not visualized, but there is no edema or inflammation in the region of the cecum. Diverticular changes are noted in the left colon without evidence of diverticulitis. Vascular/Lymphatic: There is abdominal aortic atherosclerosis without aneurysm. There is no gastrohepatic or hepatoduodenal ligament lymphadenopathy. No intraperitoneal or retroperitoneal lymphadenopathy. No pelvic sidewall lymphadenopathy. Reproductive: Prostate gland unremarkable. Penile prosthesis evident with irregular contour of the reservoir. Reservoir rupture not excluded. Other: No intraperitoneal free fluid. Musculoskeletal: Bilateral SI joint effusion evident. Lower lumbar fusion hardware associated. Patient is status post vertebral augmentation at L1. Review of the MIP images confirms the above findings. IMPRESSION: 1. Tiny segmental to subsegmental pulmonary embolus in the left upper lobe with potential miniscule focus of embolism in a distal pulmonary artery to the lingula. 2. Advanced interstitial, basilar predominant fibrotic lung disease. Scattered areas of ground-glass attenuation are associated with bronchiectasis. 3. Enlargement of the main pulmonary arteries suggests pulmonary arterial hypertension. 4. Asymmetric irregular wall thickening noted distal esophagus. Distal esophageal neoplasm a concern. 5. Mild mediastinal and hilar lymphadenopathy, possibly reactive. In the setting of neoplasm, metastatic disease would be a concern. 6. Wedge-shaped low-density  peripheral lesion in the spleen, likely an infarct of indeterminate age. 7. Status post gastric bypass. These results will be called to the ordering clinician or representative by the Radiologist Assistant, and communication documented in the PACS or zVision Dashboard. Electronically Signed   By: Misty Stanley M.D.   On: 01/12/2018 12:25    EKG: Independently reviewed.  EKG atrial fibrillation, normal axis, 81 bpm.  Assessment/Plan Active Problems:   Dyspnea  77 year old male who presents with a subacute presentation of worsening dyspnea, associated with dry cough and decreased level of physical functional capacity.  He has been seen multiple times as an outpatient with no improvement of his symptoms, failed outpatient therapy.  He is chronically anticoagulated with warfarin and his INR is therapeutic.  On his initial physical examination his ill looking appearing, deconditioned and dyspneic, blood pressure 113/80, heart rate 78, respiratory rate  26, oxygen saturation 96% on supplemental oxygen per nasal cannula.  His lungs had decreased breath sounds, positive rales at bases with poor air movement, no significant wheezing or rhonchi, heart S1-S2 present and rhythmic, abdomen soft nontender, no lower extremity edema. Sodium 135, potassium 4.6, chloride 100, bicarb 26, glucose 140, BUN 26, creatinine 1.46, white count 9.7, hemoglobin 9.2, hematocrit 35.6, platelets 309.  INR 2.7 chest CT with advanced interstitial, basilar predominant fibrotic lung disease, scattered areas of groundglass attenuation associated with bronchiectasis.  Tiny segmental to subsegmental pulmonary embolism in the left upper lobe with potential minuscule focus of embolism in the distal pulmonary artery to the lingula.  Enlargement of pulmonary arteries, asymmetric irregular wall thickening in the distal esophagus.   Patient will be admitted to the hospital with a working diagnosis dyspnea likely due to pulmonary fibrosis  flare  1.  Pulmonary fibrosis with acute exacerbation.  Patient will be admitted to the medical ward, he will be placed on high-dose systemic steroids with methylprednisolone, aggressive bronchodilator therapy, oximetry monitoring and supplemental oxygen per nasal cannula to target O2 saturation greater 92%.  Likely patient also has pulmonary hypertension, and an echocardiography will be performed.  Patient will need  to follow-up with the pulmonary clinic.  He may benefit for repeat chest x-ray in 48 hours.  Chest radiograph from November 22 had bibasilar interstitial infiltrates.  2.  Remote history of DVT and pulmonary embolism.  Chest CT with a small segmental to subsegmental pulmonary embolism, this is very unlikely to explain patient's symptoms, will continue anticoagulation with warfarin, follow-up on ultrasonography of the lower extremities and echocardiogram.  No need for heparin at this point.  3.  Dyslipidemia.  Continue atorvastatin.  4.  History of asthma.  Continue inhaled corticosteroids and long-acting beta agonist, no signs of acute exacerbation.  5.  GERD.  Continue antiacid therapy with pantoprazole and sucralfate.   6.  Recently diagnosed prostate cancer.  No signs of urinary retention, continue Flomax, follow-up as an outpatient.  DVT prophylaxis:  Warfarin  Code Status: full  Family Communication: I spoke with patient's family at the bedside and all questions were addressed.   Disposition Plan: home   Consults called: none   Admission status: Inpatient.     Mauricio Gerome Apley MD [facility] Pager [phone]  If 7PM-7AM, please contact night-coverage [email or web address] Password [email or web address]  01/12/2018, 4:31 PM

## 2018-01-12 NOTE — Telephone Encounter (Signed)
Called [facility] to see what was needed. Patient not scheduled for CT and has appt with Wyn Quaker on 01/12/2018. Sent message to [facility] -Judeen Hammans is going to schedule for patient to get CT done and see [place] on 01/12/2018.  See previous phone note.  Nothing further needed.

## 2018-01-12 NOTE — Telephone Encounter (Signed)
Discussed results with patient.  Patient being routed to [facility] emergency room. Aaron Edelman

## 2018-01-12 NOTE — Progress Notes (Addendum)
[place] for warfarin Indication: Hx PE/DVT  Allergies  Allergen Reactions  . Sulfacetamide Sodium Swelling    throat swelling  . Latex Itching    Severe contact dermatitis  . Adhesive [Tape] Other (See Comments)    Tears skin-- "No tape of any kind, they all tear skin right off"    Patient Measurements:    Vital Signs: Temp: 97.9 F (36.6 C) (12/04 1331) Temp Source: Oral (12/04 1331) BP: 126/79 (12/04 1555) Pulse Rate: 99 (12/04 1555)  Labs: Recent Labs    01/10/18 1657 01/12/18 1508  HGB 13.1 11.2*  HCT 39.8 35.6*  PLT 340.0 309  LABPROT  --  28.7*  INR  --  2.75  CREATININE 1.40 1.46*    Estimated Creatinine Clearance: 47.2 mL/min (A) (by C-G formula based on SCr of 1.46 mg/dL (H)).   Medical History: Past Medical History:  Diagnosis Date  . Arthritis    hands   . Asthma   . Chronic atrial fibrillation    a.fib on warfarin- Dr. Percival Spanish follows  . Diverticulosis of colon (without mention of hemorrhage)   . Dysrhythmia    a fib  . ED (erectile dysfunction)   . Fibromyalgia   . GERD (gastroesophageal reflux disease)   . Hard of hearing    wears hearing aids  . Headache   . History of blood clots    behind right knee and then went into left lung 84yrs ago  . History of colon polyps   . History of kidney stones   . Hx of diabetes mellitus    "no longer diabetic since gastric bypass" - no longer taking metformin"  in 2 months "problems with low blood sugar now"  . Hx of gout    but doesn't take any meds  . Inflammation of colonic mucosa    recent admission and release from [facility] 02-28-15-remains on oral antibiotic  . Obesity   . Peripheral vascular disease ([place])   . Pneumonia    last time about 26yrs ago  . Prostate cancer ([place])   . Pulmonary embolism ([place])    over 10 yrs ago "many years ago"  . Restless legs syndrome (RLS)   . Skin cancer   . Unspecified asthma(493.90)    as a child  . Unspecified  essential hypertension    hx of no longer on medication due to gastric bypass     Medications:   (Not in a hospital admission) Scheduled:   PRN:   Assessment: 40 yoM with PMH asthma, Hx VTE on warfarin, HTN,  GERD, recurrent prostate cancer with suspected mets to esophagus, presents with worsening SOB, weight loss. CTA shows small PE despite therapeutic INR of 2.75 today (goal 2.5 - 3.5 per clinic records). CXR shows opacities but completed treatment for PNA, and does not appear to be asthma either. Pharmacy consulted to dose IV heparin.    Baseline INR therapeutic  Prior anticoagulation: warfarin 3.75 mg daily except 2.5 mg on Sun & Thurs; LD 12/3  Significant events:   Today, 01/12/2018:  CBC: Hgb slightly low; Plt wnl  INR therapeutic  Major drug interactions: none   No bleeding issues per nursing  No diet ordered yet  Goal of Therapy: INR 2.5-3.5  Plan:  Warfarin 3.5 mg PO tonight at 18:00 - suspect INR may rise d/t acute illness, reduced PO intake  Daily INR  CBC at least q72 hr while on warfarin  Monitor for signs of bleeding or  thrombosis   Reuel Boom, PharmD, BCPS [phone] 01/12/2018, 6:05 PM

## 2018-01-12 NOTE — Assessment & Plan Note (Addendum)
With patient routed to [facility] emergency room for further evaluation  Patient will need GI referral, echocardiogram, heparin drip and different form of anticoagulation once stable

## 2018-01-12 NOTE — Patient Instructions (Addendum)
We will send pt to [facility] ED for further evaluation   Follow up with Pulmonary outpatient when discharged  It is flu season:   >>>Remember to be washing your hands regularly, using hand sanitizer, be careful to use around herself with has contact with people who are sick will increase her chances of getting sick yourself. >>> Best ways to protect herself from the flu: Receive the yearly flu vaccine, practice good hand hygiene washing with soap and also using hand sanitizer when available, eat a nutritious meals, get adequate rest, hydrate appropriately   Please contact the office if your symptoms worsen or you have concerns that you are not improving.   Thank you for choosing [facility] for your healthcare, and for allowing [place] to partner with you on your healthcare journey. I am thankful to be able to provide care to you today.   Wyn Quaker FNP-C

## 2018-01-12 NOTE — Progress Notes (Addendum)
@Patient  ID: [name], male    DOB: 1940/10/22, 77 y.o.   MRN: [id number]  Chief Complaint  Patient presents with  . Follow-up    Reveiw CT Abdomen and CT Angio results     Referring provider: Jinny Sanders, MD  HPI:  77 year old male never smoker followed in our office for asthma, new onset dyspnea, thromboembolic events of PE and DVT (currently on Coumadin)  PMH: Hypertension, allergic rhinitis, GERD, prostate cancer Smoker/ Smoking History: Never Smoker  Maintenance: None Pt of: Dr. Lamonte Sakai   Recent [place] [facility] Encounters:   01/10/2018-office visit- initial office consult- [place] Fairly new diagnosis of recurrent prostate cancer based off biopsies from 10/07/2017.  Carries a history of COPD although pulmonary function testing from 02/23/2011 shows only mild evidence for obstruction.  Has lost 13 pounds.  Is short of breath.  Fever intermittently.  Patient also has developed diarrhea.  Exertional tolerance is good but he gets very short of breath.  Patient has lost 13 pounds over the last 3 weeks during this illness.  Chest x-ray shows bibasilar predominant interstitial opacities.  Patient has been treated with prednisone, azithromycin and Levaquin.  Patient's Coumadin level has been at goal. Plan: CT Angio, CT abdomen with contrast, consider repeating pulmonary function test, CBC, c-Met, BNP, may need echocardiogram based off of CT results, follow-up in 2 days after completing CT  01/12/2018  - Visit   77 year old male patient presenting today for follow-up visit after completing CT angios and CT abdomen with contrast.  Patient presents with his daughter.  Patient continues to have worsening dyspnea, shortness of breath, weight loss.  Diarrhea has slightly improved.  Patient continues to have dry cough.  Patient denies hemoptysis, blood in urine or stool.  Patient reports increased fatigue.  CT Angio completed prior to office visit today showing pulmonary embolism and  questionable distal esophageal neoplasm that needs further evaluation.  Of note patient has become considerably more fatigued.  Patient has fallen twice today due to exhaustion.  Patient's daughter is present today with him she is very concerned regarding his symptoms and reports that this is the worst he is looked in a while.   Tests:  01/12/2018-CT Angio- tiny segmental to subsegmental pulmonary emboli in left upper lobe with potential minuscule focus of embolism and distal pulmonary artery to the lingula, advanced interstitial, basilar predominant fibrotic lung disease, scattered areas of groundglass attenuation are associated with bronchiectasis, enlarged pulmonary arteries suggesting pulmonary arterial hypertension, asymmetric irregular wall thickening noted distal esophagus distal esophageal neoplasm is a concern, mild mediastinal and hilar lymphadenopathy possibly reactive setting a neoplasm metastatic disease would be a concern, status post gastric bypass   FENO:  No results found for: NITRICOXIDE  PFT: No flowsheet data found.  Imaging: Dg Chest 2 View  Result Date: 12/31/2017 CLINICAL DATA:  Shortness of breath for 3 weeks. EXAM: CHEST - 2 VIEW COMPARISON:  01/06/2017 FINDINGS: There is hazy airspace disease in the right and left lower lobes concerning for pneumonia. There is no pleural effusion or pneumothorax. The heart and mediastinal contours are unremarkable. The osseous structures are unremarkable. IMPRESSION: Hazy airspace disease in the right and left lower lobes concerning for pneumonia. Electronically Signed   By: Kathreen Devoid   On: 12/31/2017 13:13   Ct Angio Chest W/cm &/or Wo Cm  Result Date: 01/12/2018 CLINICAL DATA:  Cough with dyspnea and wheezing. New diagnosis of prostate cancer. Abdominal pain. EXAM: CT ANGIOGRAPHY CHEST CT ABDOMEN AND PELVIS WITH  CONTRAST TECHNIQUE: Multidetector CT imaging of the chest was performed using the standard protocol during bolus  administration of intravenous contrast. Multiplanar CT image reconstructions and MIPs were obtained to evaluate the vascular anatomy. Multidetector CT imaging of the abdomen and pelvis was performed using the standard protocol during bolus administration of intravenous contrast. CONTRAST:  137m ISOVUE-370 IOPAMIDOL (ISOVUE-370) INJECTION 76% COMPARISON:  Pelvic CT 10/25/2017. FINDINGS: CTA CHEST FINDINGS Cardiovascular: Heart size upper normal. Coronary artery calcification is evident. Atherosclerotic calcification is noted in the wall of the thoracic aorta. Right main pulmonary artery measures 3.3 cm diameter. Left main pulmonary artery measures up to 2.7 cm diameter. Pulmonary arteries are well opacified. No right-sided filling defects to suggest acute pulmonary embolus. Patient does have a single small focus of segmental to subsegmental pulmonary embolus in the left upper lobe (image 142/series 11). Potential 3-4 mm embolic focus identified in distal pulmonary artery to the lingula (image 203/series 11). No other left-sided pulmonary embolic disease. Mediastinum/Nodes: Upper normal to mild mediastinal lymphadenopathy evident. 11 mm short axis right paratracheal node is partially obscured by beam hardening artifact from contrast in the SVC. The the the 14 mm AP window lymph node is visible on 45/4. Small lymph nodes evident in each hilum with 12 mm short axis inferior right hilar lymph node evident. Probable irregular non circumferential wall thickening in the distal esophagus (57/4) There is no axillary lymphadenopathy. Lungs/Pleura: The central tracheobronchial airways are patent. Basilar predominant interstitial opacity noted with subpleural reticulation, alveolar ground-glass opacity, and diffuse bronchiectasis. Areas of peripheral patchy ground-glass attenuation are seen in the upper lungs bilaterally. This ground-glass attenuation is associated with bronchiectasis. No pleural effusion. Musculoskeletal: No  worrisome lytic or sclerotic osseous abnormality. Patient is status post left shoulder replacement. Review of the MIP images confirms the above findings. CT ABDOMEN and PELVIS FINDINGS Hepatobiliary: No focal abnormality within the liver parenchyma. Gallbladder is distended. No intrahepatic or extrahepatic biliary dilation. Pancreas: No focal mass lesion. No dilatation of the main duct. No intraparenchymal cyst. No peripancreatic edema. Spleen: Wedge-shaped area of decreased perfusion in the the peripheral spleen suggests infarct, age indeterminate. Adrenals/Urinary Tract: No adrenal nodule or mass. 4 cm cyst identified lower pole right kidney. 8 mm well-defined low-density lesion in the lower interpolar region of the left kidney is too small to characterize but likely a cyst. No evidence for hydroureter. The urinary bladder appears normal for the degree of distention. Stomach/Bowel: Status post gastric bypass. Duodenum is normally positioned as is the ligament of Treitz. No small bowel wall thickening. No small bowel dilatation. The terminal ileum is normal. The appendix is not visualized, but there is no edema or inflammation in the region of the cecum. Diverticular changes are noted in the left colon without evidence of diverticulitis. Vascular/Lymphatic: There is abdominal aortic atherosclerosis without aneurysm. There is no gastrohepatic or hepatoduodenal ligament lymphadenopathy. No intraperitoneal or retroperitoneal lymphadenopathy. No pelvic sidewall lymphadenopathy. Reproductive: Prostate gland unremarkable. Penile prosthesis evident with irregular contour of the reservoir. Reservoir rupture not excluded. Other: No intraperitoneal free fluid. Musculoskeletal: Bilateral SI joint effusion evident. Lower lumbar fusion hardware associated. Patient is status post vertebral augmentation at L1. Review of the MIP images confirms the above findings. IMPRESSION: 1. Tiny segmental to subsegmental pulmonary embolus in  the left upper lobe with potential miniscule focus of embolism in a distal pulmonary artery to the lingula. 2. Advanced interstitial, basilar predominant fibrotic lung disease. Scattered areas of ground-glass attenuation are associated with bronchiectasis. 3. Enlargement of the main  pulmonary arteries suggests pulmonary arterial hypertension. 4. Asymmetric irregular wall thickening noted distal esophagus. Distal esophageal neoplasm a concern. 5. Mild mediastinal and hilar lymphadenopathy, possibly reactive. In the setting of neoplasm, metastatic disease would be a concern. 6. 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 7. Status post gastric bypass. These results will be called to the ordering clinician or representative by the Radiologist Assistant, and communication documented in the PACS or zVision Dashboard. Electronically Signed   By: Misty Stanley M.D.   On: 01/12/2018 12:25   Ct Abdomen Pelvis W Contrast  Result Date: 01/12/2018 CLINICAL DATA:  Cough with dyspnea and wheezing. New diagnosis of prostate cancer. Abdominal pain. EXAM: CT ANGIOGRAPHY CHEST CT ABDOMEN AND PELVIS WITH CONTRAST TECHNIQUE: Multidetector CT imaging of the chest was performed using the standard protocol during bolus administration of intravenous contrast. Multiplanar CT image reconstructions and MIPs were obtained to evaluate the vascular anatomy. Multidetector CT imaging of the abdomen and pelvis was performed using the standard protocol during bolus administration of intravenous contrast. CONTRAST:  176m ISOVUE-370 IOPAMIDOL (ISOVUE-370) INJECTION 76% COMPARISON:  Pelvic CT 10/25/2017. FINDINGS: CTA CHEST FINDINGS Cardiovascular: Heart size upper normal. Coronary artery calcification is evident. Atherosclerotic calcification is noted in the wall of the thoracic aorta. Right main pulmonary artery measures 3.3 cm diameter. Left main pulmonary artery measures up to 2.7 cm  diameter. Pulmonary arteries are well opacified. No right-sided filling defects to suggest acute pulmonary embolus. Patient does have a single small focus of segmental to subsegmental pulmonary embolus in the left upper lobe (image 142/series 11). Potential 3-4 mm embolic focus identified in distal pulmonary artery to the lingula (image 203/series 11). No other left-sided pulmonary embolic disease. Mediastinum/Nodes: Upper normal to mild mediastinal lymphadenopathy evident. 11 mm short axis right paratracheal node is partially obscured by beam hardening artifact from contrast in the SVC. The the the 14 mm AP window lymph node is visible on 45/4. Small lymph nodes evident in each hilum with 12 mm short axis inferior right hilar lymph node evident. Probable irregular non circumferential wall thickening in the distal esophagus (57/4) There is no axillary lymphadenopathy. Lungs/Pleura: The central tracheobronchial airways are patent. Basilar predominant interstitial opacity noted with subpleural reticulation, alveolar ground-glass opacity, and diffuse bronchiectasis. Areas of peripheral patchy ground-glass attenuation are seen in the upper lungs bilaterally. This ground-glass attenuation is associated with bronchiectasis. No pleural effusion. Musculoskeletal: No worrisome lytic or sclerotic osseous abnormality. Patient is status post left shoulder replacement. Review of the MIP images confirms the above findings. CT ABDOMEN and PELVIS FINDINGS Hepatobiliary: No focal abnormality within the liver parenchyma. Gallbladder is distended. No intrahepatic or extrahepatic biliary dilation. Pancreas: No focal mass lesion. No dilatation of the main duct. No intraparenchymal cyst. No peripancreatic edema. Spleen: Wedge-shaped area of decreased perfusion in the the peripheral spleen suggests infarct, age indeterminate. Adrenals/Urinary Tract: No adrenal nodule or mass. 4 cm cyst identified lower pole right kidney. 8 mm well-defined  low-density lesion in the lower interpolar region of the left kidney is too small to characterize but likely a cyst. No evidence for hydroureter. The urinary bladder appears normal for the degree of distention. Stomach/Bowel: Status post gastric bypass. Duodenum is normally positioned as is the ligament of Treitz. No small bowel wall thickening. No small bowel dilatation. The terminal ileum is normal. The appendix is not visualized, but there is no edema or inflammation in the region of the cecum. Diverticular changes are noted in the left  colon without evidence of diverticulitis. Vascular/Lymphatic: There is abdominal aortic atherosclerosis without aneurysm. There is no gastrohepatic or hepatoduodenal ligament lymphadenopathy. No intraperitoneal or retroperitoneal lymphadenopathy. No pelvic sidewall lymphadenopathy. Reproductive: Prostate gland unremarkable. Penile prosthesis evident with irregular contour of the reservoir. Reservoir rupture not excluded. Other: No intraperitoneal free fluid. Musculoskeletal: Bilateral SI joint effusion evident. Lower lumbar fusion hardware associated. Patient is status post vertebral augmentation at L1. Review of the MIP images confirms the above findings. IMPRESSION: 1. Tiny segmental to subsegmental pulmonary embolus in the left upper lobe with potential miniscule focus of embolism in a distal pulmonary artery to the lingula. 2. Advanced interstitial, basilar predominant fibrotic lung disease. Scattered areas of ground-glass attenuation are associated with bronchiectasis. 3. Enlargement of the main pulmonary arteries suggests pulmonary arterial hypertension. 4. Asymmetric irregular wall thickening noted distal esophagus. Distal esophageal neoplasm a concern. 5. Mild mediastinal and hilar lymphadenopathy, possibly reactive. In the setting of neoplasm, metastatic disease would be a concern. 6. Wedge-shaped low-density peripheral lesion in the spleen, likely an infarct of  indeterminate age. 7. Status post gastric bypass. These results will be called to the ordering clinician or representative by the Radiologist Assistant, and communication documented in the PACS or zVision Dashboard. Electronically Signed   By: Misty Stanley M.D.   On: 01/12/2018 12:25   Vas [place] Lower Extremity Venous (dvt) (only [facility])  Result Date: 01/12/2018  Lower Venous Study Indications: Pulmonary embolism.  Performing Technologist: Oliver Hum RVT  Examination Guidelines: A complete evaluation includes B-mode imaging, spectral Doppler, color Doppler, and power Doppler as needed of all accessible portions of each vessel. Bilateral testing is considered an integral part of a complete examination. Limited examinations for reoccurring indications may be performed as noted. The reflux portion of the exam is performed with the patient in reverse Trendelenburg.  Right Venous Findings: +---------+---------------+---------+-----------+----------+-------+          CompressibilityPhasicitySpontaneityPropertiesSummary +---------+---------------+---------+-----------+----------+-------+ CFV      Full           Yes      Yes                          +---------+---------------+---------+-----------+----------+-------+ SFJ      Full                                                 +---------+---------------+---------+-----------+----------+-------+ FV Prox  Full                                                 +---------+---------------+---------+-----------+----------+-------+ FV Mid   Full                                                 +---------+---------------+---------+-----------+----------+-------+ FV DistalFull                                                 +---------+---------------+---------+-----------+----------+-------+ PFV  Full                                                 +---------+---------------+---------+-----------+----------+-------+ POP       Full           Yes      Yes                          +---------+---------------+---------+-----------+----------+-------+ PTV      Full                                                 +---------+---------------+---------+-----------+----------+-------+ PERO     Full                                                 +---------+---------------+---------+-----------+----------+-------+  Left Venous Findings: +---------+---------------+---------+-----------+----------+-------+          CompressibilityPhasicitySpontaneityPropertiesSummary +---------+---------------+---------+-----------+----------+-------+ CFV      Full           Yes      Yes                          +---------+---------------+---------+-----------+----------+-------+ SFJ      Full                                                 +---------+---------------+---------+-----------+----------+-------+ FV Prox  Full                                                 +---------+---------------+---------+-----------+----------+-------+ FV Mid   Full                                                 +---------+---------------+---------+-----------+----------+-------+ FV DistalFull                                                 +---------+---------------+---------+-----------+----------+-------+ PFV      Full                                                 +---------+---------------+---------+-----------+----------+-------+ POP      Full           Yes      Yes                          +---------+---------------+---------+-----------+----------+-------+ PTV  Full                                                 +---------+---------------+---------+-----------+----------+-------+ PERO     Full                                                 +---------+---------------+---------+-----------+----------+-------+    Summary: Right: There is no evidence of deep vein thrombosis in the  lower extremity. No cystic structure found in the popliteal fossa. Left: There is no evidence of deep vein thrombosis in the lower extremity. No cystic structure found in the popliteal fossa.  *See table(s) above for measurements and observations. Electronically signed by Quay Burow MD on 01/12/2018 at 4:45:11 PM.    Final     Chart Review:    Specialty Problems      Pulmonary Problems   Airway hyperreactivity    Overview:  Overview:  Qualifier: Diagnosis of  By: Diona Browner MD, Amy   Last Assessment & Plan:  Stable , no respiratory concerns at this time. Recommend IS after surgery.      Asthma, moderate persistent    Qualifier: Diagnosis of  By: Diona Browner MD, Amy        Allergic rhinitis    Overview:  Overview:  Qualifier: Diagnosis of  By: Diona Browner MD, Amy       Dyspnea   Persistent cough for 3 weeks or longer   Pulmonary fibrosis (HCC)      Allergies  Allergen Reactions  . Sulfacetamide Sodium Swelling    throat swelling  . Latex Itching    Severe contact dermatitis  . Adhesive [Tape] Other (See Comments)    Tears skin-- "No tape of any kind, they all tear skin right off"    Immunization History  Administered Date(s) Administered  . Influenza Whole 11/09/2005, 11/09/2008, 11/10/2010  . Influenza, High Dose Seasonal PF 12/08/2013, 11/02/2015, 11/09/2016, 12/20/2017  . Influenza-Unspecified 11/29/2014  . Pneumococcal Conjugate-13 12/22/2013  . Pneumococcal Polysaccharide-23 02/10/2003, 11/09/2008, 11/02/2015  . Td 02/09/2005  . Tdap 06/06/2015  . Zoster 06/21/2015    Past Medical History:  Diagnosis Date  . Arthritis    hands   . Asthma   . Chronic atrial fibrillation    a.fib on warfarin- Dr. Percival Spanish follows  . Diverticulosis of colon (without mention of hemorrhage)   . Dysrhythmia    a fib  . ED (erectile dysfunction)   . Fibromyalgia   . GERD (gastroesophageal reflux disease)   . Hard of hearing    wears hearing aids  . Headache   . History  of blood clots    behind right knee and then went into left lung 69yr ago  . History of colon polyps   . History of kidney stones   . Hx of diabetes mellitus    "no longer diabetic since gastric bypass" - no longer taking metformin"  in 2 months "problems with low blood sugar now"  . Hx of gout    but doesn't take any meds  . Inflammation of colonic mucosa    [facility]  . Obesity   . [place]ipheral vascular disease ([place]   . Pneumonia  last time about 27yr ago  . [place]state cancer ([place]   . Pulmonary embolism ([place]    over 10 yrs ago "many years ago"  . Restless legs syndrome (RLS)   . Skin cancer   . Unspecified asthma(493.90)    as a child  . Unspecified essential hypertension    hx of no longer on medication due to gastric bypass     Tobacco History: Social History   Tobacco Use  Smoking Status Never Smoker  Smokeless Tobacco Never Used   Counseling given: Yes  Continue to not smoke  No facility-administered encounter medications on file as of 01/12/2018.    Outpatient Encounter Medications as of 01/12/2018  Medication Sig  . atorvastatin (LIPITOR) 80 MG tablet Take 1 tablet (80 mg total) by mouth at bedtime.  . chlorpheniramine-HYDROcodone (TUSSIONEX) 10-8 MG/5ML SUER Take 5 mLs by mouth every 12 (twelve) hours as needed for cough. (Patient taking differently: Take 1.5 mLs by mouth every 12 (twelve) hours as needed for cough. )  . FLUoxetine (PROZAC) 20 MG capsule TAKE 3 CAPSULES BY MOUTH EVERYDAY AT BEDTIME (Patient taking differently: Take 60 mg by mouth at bedtime. )  . Fluticasone-Salmeterol (ADVAIR DISKUS) 250-50 MCG/DOSE AEPB Inhale 1 puff into the lungs 2 (two) times daily. (Patient taking differently: Inhale 1 puff into the lungs 2 (two) times daily as needed (shortness of breath). )  . furosemide (LASIX) 20 MG tablet Take 1 tablet (20 mg total) by mouth every other day. (Patient taking differently:  Take 20 mg by mouth daily. )  . gabapentin (NEURONTIN) 300 MG capsule TAKE 1 CAPSULE BY MOUTH TWICE A DAY (Patient taking differently: Take 300 mg by mouth 2 (two) times daily. )  . hydrocortisone 2.5 % cream Apply 1 application topically 2 (two) times daily as needed (itching).   .Marland Kitchenipratropium-albuterol (DUONEB) 0.5-2.5 (3) MG/3ML SOLN Take 3 mLs by nebulization every 4 (four) hours as needed. (Patient taking differently: Take 3 mLs by nebulization 3 (three) times daily. )  . omeprazole (PRILOSEC) 40 MG capsule TAKE 1 CAPSULE (40 MG TOTAL) BY MOUTH DAILY. (Patient taking differently: Take 40 mg by mouth at bedtime. )  . primidone (MYSOLINE) 50 MG tablet 150 mg in the morning, 100 mg at night (Patient taking differently: Take 100-150 mg by mouth See admin instructions. 150 mg in the morning, 100 mg at night)  . ranitidine (ZANTAC) 300 MG tablet Take 300 mg by mouth at bedtime.   . sucralfate (CARAFATE) 1 g tablet Take 1 g by mouth 3 (three) times daily before meals.   . tamsulosin (FLOMAX) 0.4 MG CAPS capsule Take 0.4 mg by mouth at bedtime.   .Marland KitchentiZANidine (ZANAFLEX) 4 MG tablet Take 4 mg by mouth every 6 (six) hours as needed for muscle spasms.   .Marland Kitchenwarfarin (COUMADIN) 2.5 MG tablet TAKE 1 TO 1 AND 1/2 TABLETS DAILY AS DIRECTED BY COUMADIN CLINIC (Patient taking differently: Take 2.5-3.75 mg by mouth See admin instructions. 2.557mon Sunday and Thursday. 3.757mn all other days.)  . [DISCONTINUED] acetaminophen-codeine (TYLENOL #3) 300-30 MG tablet   . [DISCONTINUED] diclofenac sodium (VOLTAREN) 1 % GEL APPLY 2-3 GRAMS (1 GRAM=1 INCH) TO AFFECTED AREA 4 TIMES DAILY  . [DISCONTINUED] silodosin (RAPAFLO) 8 MG CAPS capsule Take by mouth.  . [DISCONTINUED] warfarin (COUMADIN) 2.5 MG tablet TAKE 1 TO 1.5 TABLETS BY MOUTH DAILY OR AS DIRECTED BY COUMADIN CLINIC     Review of Systems  Review of Systems  Constitutional: Positive for  activity change, appetite change, chills, fatigue, fever (Intermittent  with chills) and unexpected weight change (13 pounds over the last 5 weeks).  HENT: Positive for trouble swallowing. Negative for congestion.   Respiratory: Positive for cough and shortness of breath. Negative for chest tightness and wheezing.        Denies hemoptysis  Cardiovascular: Negative for chest pain and palpitations.  Gastrointestinal: Negative for blood in stool.  Genitourinary: Negative for hematuria.  Musculoskeletal: Positive for gait problem.       Two recent falls today from fatigue  Neurological: Positive for dizziness and weakness.  Psychiatric/Behavioral: Positive for dysphoric mood. The patient is not nervous/anxious.      Physical Exam  BP 116/60 (BP Location: Left Arm, Cuff Size: Normal)   Pulse (!) 106   Ht 6' (1.829 m)   Wt 177 lb 1.6 oz (80.3 kg)   SpO2 92%   BMI 24.02 kg/m   Wt Readings from Last 5 Encounters:  01/12/18 177 lb 1.6 oz (80.3 kg)  01/10/18 180 lb (81.6 kg)  01/05/18 180 lb 1 oz (81.7 kg)  12/31/17 182 lb 2 oz (82.6 kg)  12/21/17 187 lb 8 oz (85 kg)     Physical Exam  Constitutional: He is oriented to person, place, and time. He appears unhealthy. He appears cachectic. He has a sickly appearance. No distress.  Thin elderly male  HENT:  Head: Normocephalic and atraumatic.  Right Ear: Hearing, tympanic membrane, external ear and ear canal normal.  Left Ear: Hearing, tympanic membrane, external ear and ear canal normal.  Nose: Nose normal. Right sinus exhibits no maxillary sinus tenderness and no frontal sinus tenderness. Left sinus exhibits no maxillary sinus tenderness and no frontal sinus tenderness.  Mouth/Throat: Uvula is midline and oropharynx is clear and moist. No oropharyngeal exudate.  Eyes: Pupils are equal, round, and reactive to light.  Neck: Normal range of motion. Neck supple. No JVD present.    Cardiovascular: Regular rhythm and normal heart sounds. Tachycardia present.  Pulmonary/Chest: Effort normal and breath sounds  normal. No accessory muscle usage. No respiratory distress. He has no decreased breath sounds. He has no wheezes. He has no rhonchi.  Diminished in bases, ? BB crackles   Abdominal: Soft. Bowel sounds are normal. There is no tenderness.  Musculoskeletal: Normal range of motion.  Lymphadenopathy:    He has no cervical adenopathy.  Neurological: He is alert and oriented to person, place, and time.  Skin: Skin is warm and dry. He is not diaphoretic. No erythema.  Psychiatric: Memory and judgment normal. He exhibits a depressed mood. He has a flat affect.  + Tearful through exam and interview  Nursing note and vitals reviewed.     Lab Results:  CBC    Component Value Date/Time   WBC 9.7 01/12/2018 1508   RBC 3.82 (L) 01/12/2018 1508   HGB 11.2 (L) 01/12/2018 1508   HCT 35.6 (L) 01/12/2018 1508   PLT 309 01/12/2018 1508   MCV 93.2 01/12/2018 1508   MCH 29.3 01/12/2018 1508   MCHC 31.5 01/12/2018 1508   RDW 13.3 01/12/2018 1508   LYMPHSABS 1.4 01/12/2018 1508   MONOABS 1.2 (H) 01/12/2018 1508   EOSABS 0.5 01/12/2018 1508   BASOSABS 0.1 01/12/2018 1508    BMET    Component Value Date/Time   NA 135 01/12/2018 1508   K 4.6 01/12/2018 1508   CL 100 01/12/2018 1508   CO2 26 01/12/2018 1508   GLUCOSE 140 (H) 01/12/2018 1508  BUN 26 (H) 01/12/2018 1508   CREATININE 1.46 (H) 01/12/2018 1508   CALCIUM 8.3 (L) 01/12/2018 1508   GFRNONAA 46 (L) 01/12/2018 1508   GFRAA 53 (L) 01/12/2018 1508    BNP No results found for: BNP  ProBNP    Component Value Date/Time   PROBNP 123.0 (H) 01/10/2018 1657      Assessment & Plan:   Pleasant 77 year old male patient seen in office visit today for follow-up after CT Angio and CT abdomen.  Unfortunately after reviewing those results it appears the patient has developed a pulmonary embolism in spite of Coumadin therapy. 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  Also concerning potential esophageal  neoplasm that needs further evaluation.  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  If patient did not have the other comorbidities were worsening symptoms we could consider outpatient therapy but I do not believe that is an option based off of known prostate cancer, weight loss and need for further evaluation by GI for his potential esophageal neoplasm.  We attempted to contact the hospital to secure a bed to directly admit from our office but we are informed that there are no telemetry beds at [facility] or [facility] at this time.  I do not believe the patient should wait, patient and daughter agree they will present to the emergency room.    Patient will need pulmonary follow-up after hospital evaluation.  I have updated Dr. Lamonte Sakai with patient's plan of care and recent results.  Dyspnea CTA with small PE  Patient routed to [facility] emergency room for further evaluation.  Patient was unable to be directly admitted due to no beds being available per PEG control.    Abnormal finding on lung imaging With patient routed to [facility] emergency room for further evaluation  Patient will need GI referral, echocardiogram, heparin drip and different form of anticoagulation once stable    This appointment was 34 minutes along with over 50% of that time face-to-face patient care discussion.  Plan of care discussion, discussion of case with Dr. Lamonte Sakai via telephone.  Lauraine Rinne, NP 01/12/2018

## 2018-01-12 NOTE — Progress Notes (Signed)
Bilateral lower extrmeity venous duplex has been completed. Negative for DVT. Results were given to Ocie Cornfield PA.  01/12/18 4:37 PM [name] RVT

## 2018-01-12 NOTE — ED Notes (Signed)
ED TO INPATIENT HANDOFF REPORT  Name/Age/Gender [name] 77 y.o. male  Code Status Code Status History    Date Active Date Inactive Code Status Order ID Comments User Context   04/27/2017 1614 04/29/2017 1758 Full Code [id number]  Macie Burows Inpatient   08/25/2016 1256 08/27/2016 1621 Full Code [id number]  Jonathan Hausen, MD Inpatient   03/08/2015 1139 03/09/2015 1402 Full Code [id number]  Jonathan Hausen, MD Inpatient   02/28/2015 2337 03/03/2015 1413 Full Code [id number]  Jonathan Pert, MD Inpatient   07/02/2014 1045 07/03/2014 1718 Full Code [id number]  Jonathan Ready, MD Inpatient   05/13/2013 2340 05/17/2013 1414 Full Code [id number]  Jonathan Miss, MD Inpatient   05/09/2013 1313 05/11/2013 2026 Full Code [id number]  Jonathan Stakes, MD Inpatient   01/26/2013 1430 01/27/2013 1516 Full Code [id number]  Jonathan Mitts, PA-C Inpatient   08/12/2011 0720 08/14/2011 1335 Full Code [id number]  Artis Flock, RN Inpatient    Advance Directive Documentation     Most Recent Value  Type of Advance Directive  Healthcare Power of Attorney, Living will  Pre-existing out of facility DNR order (yellow form or pink MOST form)  -  "MOST" Form in Place?  -      Home/SNF/Other Home  Chief Complaint sent by dr  Level of Care/Admitting Diagnosis ED Disposition    ED Disposition Condition [place]: [place] [[id number]]  Level of Care: Med-Surg [16]  Diagnosis: Pulmonary fibrosis [facility]) [[id number]]  Admitting Physician: Tawni Millers [[id number]]  Attending Physician: Tawni Millers [[id number]]  Estimated length of stay: 3 - 4 days  Certification:: I certify this patient will need inpatient services for at least 2 midnights  PT Class (Do Not Modify): Inpatient [101]  PT Acc Code (Do Not Modify): Private [1]       Medical History Past Medical History:  Diagnosis Date  . Arthritis    hands   . Asthma   . Chronic atrial  fibrillation    a.fib on warfarin- Dr. Percival Spanish follows  . Diverticulosis of colon (without mention of hemorrhage)   . Dysrhythmia    a fib  . ED (erectile dysfunction)   . Fibromyalgia   . GERD (gastroesophageal reflux disease)   . Hard of hearing    wears hearing aids  . Headache   . History of blood clots    behind right knee and then went into left lung 61yrs ago  . History of colon polyps   . History of kidney stones   . Hx of diabetes mellitus    "no longer diabetic since gastric bypass" - no longer taking metformin"  in 2 months "problems with low blood sugar now"  . Hx of gout    but doesn't take any meds  . Inflammation of colonic mucosa    recent admission and release from [facility] 02-28-15-remains on oral antibiotic  . Obesity   . Peripheral vascular disease ([place])   . Pneumonia    last time about 45yrs ago  . Prostate cancer ([place])   . Pulmonary embolism ([place])    over 10 yrs ago "many years ago"  . Restless legs syndrome (RLS)   . Skin cancer   . Unspecified asthma(493.90)    as a child  . Unspecified essential hypertension    hx of no longer on medication due to gastric bypass     Allergies Allergies  Allergen Reactions  . Sulfacetamide Sodium Swelling  throat swelling  . Latex Itching    Severe contact dermatitis  . Adhesive [Tape] Other (See Comments)    Tears skin-- "No tape of any kind, they all tear skin right off"    IV Location/Drains/Wounds Patient Lines/Drains/Airways Status   Active Line/Drains/Airways    Name:   Placement date:   Placement time:   Site:   Days:   Peripheral IV 01/12/18 Left Antecubital   01/12/18    1516    Antecubital   less than 1   Negative Pressure Wound Therapy Abdomen Mid   08/25/16    1006    -   505   Incision (Closed) 07/02/14 Perineum   07/02/14    0914     1290   Incision (Closed) 11/23/14 Penis   11/23/14    0840     1146   Incision (Closed) 03/08/15 Abdomen Other (Comment)   03/08/15    0951     1041   Incision  (Closed) 08/25/16 Abdomen   08/25/16    1008     505   Incision (Closed) 04/27/17 Knee Right   04/27/17    1154     260          Labs/Imaging Results for orders placed or performed during the hospital encounter of 01/12/18 (from the past 48 hour(s))  CBC with Differential     Status: Abnormal   Collection Time: 01/12/18  3:08 PM  Result Value Ref Range   WBC 9.7 4.0 - 10.5 K/uL   RBC 3.82 (L) 4.22 - 5.81 MIL/uL   Hemoglobin 11.2 (L) 13.0 - 17.0 g/dL   HCT 35.6 (L) 39.0 - 52.0 %   MCV 93.2 80.0 - 100.0 fL   MCH 29.3 26.0 - 34.0 pg   MCHC 31.5 30.0 - 36.0 g/dL   RDW 13.3 11.5 - 15.5 %   Platelets 309 150 - 400 K/uL   nRBC 0.0 0.0 - 0.2 %   Neutrophils Relative % 66 %   Neutro Abs 6.4 1.7 - 7.7 K/uL   Lymphocytes Relative 14 %   Lymphs Abs 1.4 0.7 - 4.0 K/uL   Monocytes Relative 13 %   Monocytes Absolute 1.2 (H) 0.1 - 1.0 K/uL   Eosinophils Relative 5 %   Eosinophils Absolute 0.5 0.0 - 0.5 K/uL   Basophils Relative 1 %   Basophils Absolute 0.1 0.0 - 0.1 K/uL   Immature Granulocytes 1 %   Abs Immature Granulocytes 0.05 0.00 - 0.07 K/uL    Comment: Performed at [facility], [place] [address] [place], [place] [address]  Comprehensive metabolic panel     Status: Abnormal   Collection Time: 01/12/18  3:08 PM  Result Value Ref Range   Sodium 135 135 - 145 mmol/L   Potassium 4.6 3.5 - 5.1 mmol/L   Chloride 100 98 - 111 mmol/L   CO2 26 22 - 32 mmol/L   Glucose, Bld 140 (H) 70 - 99 mg/dL   BUN 26 (H) 8 - 23 mg/dL   Creatinine, Ser 1.46 (H) 0.61 - 1.24 mg/dL   Calcium 8.3 (L) 8.9 - 10.3 mg/dL   Total Protein 6.7 6.5 - 8.1 g/dL   Albumin 3.0 (L) 3.5 - 5.0 g/dL   AST 24 15 - 41 U/L   ALT 14 0 - 44 U/L   Alkaline Phosphatase 74 38 - 126 U/L   Total Bilirubin 0.5 0.3 - 1.2 mg/dL   GFR calc non Af Amer 46 (  L) >60 mL/min   GFR calc Af Amer 53 (L) >60 mL/min   Anion gap 9 5 - 15    Comment: Performed at [facility], [place] [address]  [place], [place] [address]  Protime-INR     Status: Abnormal   Collection Time: 01/12/18  3:08 PM  Result Value Ref Range   Prothrombin Time 28.7 (H) 11.4 - 15.2 seconds   INR 2.75     Comment: Performed at [facility], [place] [address] [place], [place] [address]  Influenza panel by PCR (type A & B)     Status: None   Collection Time: 01/12/18  5:00 PM  Result Value Ref Range   Influenza A By PCR NEGATIVE NEGATIVE   Influenza B By PCR NEGATIVE NEGATIVE    Comment: (NOTE) The Xpert Xpress Flu assay is intended as an aid in the diagnosis of  influenza and should not be used as a sole basis for treatment.  This  assay is FDA approved for nasopharyngeal swab specimens only. Nasal  washings and aspirates are unacceptable for Xpert Xpress Flu testing. Performed at [facility], [place] [address] [place], [place] [address]    *Note: Due to a large number of results and/or encounters for the requested time period, some results have not been displayed. A complete set of results can be found in Results Review.   Ct Angio Chest W/cm &/or Wo Cm  Result Date: 01/12/2018 CLINICAL DATA:  Cough with dyspnea and wheezing. New diagnosis of prostate cancer. Abdominal pain. EXAM: CT ANGIOGRAPHY CHEST CT ABDOMEN AND PELVIS WITH CONTRAST TECHNIQUE: Multidetector CT imaging of the chest was performed using the standard protocol during bolus administration of intravenous contrast. Multiplanar CT image reconstructions and MIPs were obtained to evaluate the vascular anatomy. Multidetector CT imaging of the abdomen and pelvis was performed using the standard protocol during bolus administration of intravenous contrast. CONTRAST:  165mL ISOVUE-370 IOPAMIDOL (ISOVUE-370) INJECTION 76% COMPARISON:  Pelvic CT 10/25/2017. FINDINGS: CTA CHEST FINDINGS Cardiovascular: Heart size upper normal. Coronary artery calcification is evident. Atherosclerotic calcification is noted in the wall of the  thoracic aorta. Right main pulmonary artery measures 3.3 cm diameter. Left main pulmonary artery measures up to 2.7 cm diameter. Pulmonary arteries are well opacified. No right-sided filling defects to suggest acute pulmonary embolus. Patient does have a single small focus of segmental to subsegmental pulmonary embolus in the left upper lobe (image 142/series 11). Potential 3-4 mm embolic focus identified in distal pulmonary artery to the lingula (image 203/series 11). No other left-sided pulmonary embolic disease. Mediastinum/Nodes: Upper normal to mild mediastinal lymphadenopathy evident. 11 mm short axis right paratracheal node is partially obscured by beam hardening artifact from contrast in the SVC. The the the 14 mm AP window lymph node is visible on 45/4. Small lymph nodes evident in each hilum with 12 mm short axis inferior right hilar lymph node evident. Probable irregular non circumferential wall thickening in the distal esophagus (57/4) There is no axillary lymphadenopathy. Lungs/Pleura: The central tracheobronchial airways are patent. Basilar predominant interstitial opacity noted with subpleural reticulation, alveolar ground-glass opacity, and diffuse bronchiectasis. Areas of peripheral patchy ground-glass attenuation are seen in the upper lungs bilaterally. This ground-glass attenuation is associated with bronchiectasis. No pleural effusion. Musculoskeletal: No worrisome lytic or sclerotic osseous abnormality. Patient is status post left shoulder replacement. Review of the MIP images confirms the above findings. CT ABDOMEN and PELVIS FINDINGS Hepatobiliary: No focal abnormality within the liver parenchyma. Gallbladder  is distended. No intrahepatic or extrahepatic biliary dilation. Pancreas: No focal mass lesion. No dilatation of the main duct. No intraparenchymal cyst. No peripancreatic edema. Spleen: Wedge-shaped area of decreased perfusion in the the peripheral spleen suggests infarct, age  indeterminate. Adrenals/Urinary Tract: No adrenal nodule or mass. 4 cm cyst identified lower pole right kidney. 8 mm well-defined low-density lesion in the lower interpolar region of the left kidney is too small to characterize but likely a cyst. No evidence for hydroureter. The urinary bladder appears normal for the degree of distention. Stomach/Bowel: Status post gastric bypass. Duodenum is normally positioned as is the ligament of Treitz. No small bowel wall thickening. No small bowel dilatation. The terminal ileum is normal. The appendix is not visualized, but there is no edema or inflammation in the region of the cecum. Diverticular changes are noted in the left colon without evidence of diverticulitis. Vascular/Lymphatic: There is abdominal aortic atherosclerosis without aneurysm. There is no gastrohepatic or hepatoduodenal ligament lymphadenopathy. No intraperitoneal or retroperitoneal lymphadenopathy. No pelvic sidewall lymphadenopathy. Reproductive: Prostate gland unremarkable. Penile prosthesis evident with irregular contour of the reservoir. Reservoir rupture not excluded. Other: No intraperitoneal free fluid. Musculoskeletal: Bilateral SI joint effusion evident. Lower lumbar fusion hardware associated. Patient is status post vertebral augmentation at L1. Review of the MIP images confirms the above findings. IMPRESSION: 1. Tiny segmental to subsegmental pulmonary embolus in the left upper lobe with potential miniscule focus of embolism in a distal pulmonary artery to the lingula. 2. Advanced interstitial, basilar predominant fibrotic lung disease. Scattered areas of ground-glass attenuation are associated with bronchiectasis. 3. Enlargement of the main pulmonary arteries suggests pulmonary arterial hypertension. 4. Asymmetric irregular wall thickening noted distal esophagus. Distal esophageal neoplasm a concern. 5. Mild mediastinal and hilar lymphadenopathy, possibly reactive. In the setting of neoplasm,  metastatic disease would be a concern. 6. 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 7. Status post gastric bypass. These results will be called to the ordering clinician or representative by the Radiologist Assistant, and communication documented in the PACS or zVision Dashboard. Electronically Signed   By: Misty Stanley M.D.   On: 01/12/2018 12:25   Ct Abdomen Pelvis W Contrast  Result Date: 01/12/2018 CLINICAL DATA:  Cough with dyspnea and wheezing. New diagnosis of prostate cancer. Abdominal pain. EXAM: CT ANGIOGRAPHY CHEST CT ABDOMEN AND PELVIS WITH CONTRAST TECHNIQUE: Multidetector CT imaging of the chest was performed using the standard protocol during bolus administration of intravenous contrast. Multiplanar CT image reconstructions and MIPs were obtained to evaluate the vascular anatomy. Multidetector CT imaging of the abdomen and pelvis was performed using the standard protocol during bolus administration of intravenous contrast. CONTRAST:  14mL ISOVUE-370 IOPAMIDOL (ISOVUE-370) INJECTION 76% COMPARISON:  Pelvic CT 10/25/2017. FINDINGS: CTA CHEST FINDINGS Cardiovascular: Heart size upper normal. Coronary artery calcification is evident. Atherosclerotic calcification is noted in the wall of the thoracic aorta. Right main pulmonary artery measures 3.3 cm diameter. Left main pulmonary artery measures up to 2.7 cm diameter. Pulmonary arteries are well opacified. No right-sided filling defects to suggest acute pulmonary embolus. Patient does have a single small focus of segmental to subsegmental pulmonary embolus in the left upper lobe (image 142/series 11). Potential 3-4 mm embolic focus identified in distal pulmonary artery to the lingula (image 203/series 11). No other left-sided pulmonary embolic disease. Mediastinum/Nodes: Upper normal to mild mediastinal lymphadenopathy evident. 11 mm short axis right paratracheal node is partially obscured by  beam hardening artifact from contrast in the  SVC. The the the 14 mm AP window lymph node is visible on 45/4. Small lymph nodes evident in each hilum with 12 mm short axis inferior right hilar lymph node evident. Probable irregular non circumferential wall thickening in the distal esophagus (57/4) There is no axillary lymphadenopathy. Lungs/Pleura: The central tracheobronchial airways are patent. Basilar predominant interstitial opacity noted with subpleural reticulation, alveolar ground-glass opacity, and diffuse bronchiectasis. Areas of peripheral patchy ground-glass attenuation are seen in the upper lungs bilaterally. This ground-glass attenuation is associated with bronchiectasis. No pleural effusion. Musculoskeletal: No worrisome lytic or sclerotic osseous abnormality. Patient is status post left shoulder replacement. Review of the MIP images confirms the above findings. CT ABDOMEN and PELVIS FINDINGS Hepatobiliary: No focal abnormality within the liver parenchyma. Gallbladder is distended. No intrahepatic or extrahepatic biliary dilation. Pancreas: No focal mass lesion. No dilatation of the main duct. No intraparenchymal cyst. No peripancreatic edema. Spleen: Wedge-shaped area of decreased perfusion in the the peripheral spleen suggests infarct, age indeterminate. Adrenals/Urinary Tract: No adrenal nodule or mass. 4 cm cyst identified lower pole right kidney. 8 mm well-defined low-density lesion in the lower interpolar region of the left kidney is too small to characterize but likely a cyst. No evidence for hydroureter. The urinary bladder appears normal for the degree of distention. Stomach/Bowel: Status post gastric bypass. Duodenum is normally positioned as is the ligament of Treitz. No small bowel wall thickening. No small bowel dilatation. The terminal ileum is normal. The appendix is not visualized, but there is no edema or inflammation in the region of the cecum. Diverticular changes are noted in the  left colon without evidence of diverticulitis. Vascular/Lymphatic: There is abdominal aortic atherosclerosis without aneurysm. There is no gastrohepatic or hepatoduodenal ligament lymphadenopathy. No intraperitoneal or retroperitoneal lymphadenopathy. No pelvic sidewall lymphadenopathy. Reproductive: Prostate gland unremarkable. Penile prosthesis evident with irregular contour of the reservoir. Reservoir rupture not excluded. Other: No intraperitoneal free fluid. Musculoskeletal: Bilateral SI joint effusion evident. Lower lumbar fusion hardware associated. Patient is status post vertebral augmentation at L1. Review of the MIP images confirms the above findings. IMPRESSION: 1. Tiny segmental to subsegmental pulmonary embolus in the left upper lobe with potential miniscule focus of embolism in a distal pulmonary artery to the lingula. 2. Advanced interstitial, basilar predominant fibrotic lung disease. Scattered areas of ground-glass attenuation are associated with bronchiectasis. 3. Enlargement of the main pulmonary arteries suggests pulmonary arterial hypertension. 4. Asymmetric irregular wall thickening noted distal esophagus. Distal esophageal neoplasm a concern. 5. Mild mediastinal and hilar lymphadenopathy, possibly reactive. In the setting of neoplasm, metastatic disease would be a concern. 6. 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 7. Status post gastric bypass. These results will be called to the ordering clinician or representative by the Radiologist Assistant, and communication documented in the PACS or zVision Dashboard. Electronically Signed   By: Misty Stanley M.D.   On: 01/12/2018 12:25   Vas [place] Lower Extremity Venous (dvt) (only [facility])  Result Date: 01/12/2018  Lower Venous Study Indications: Pulmonary embolism.  Performing Technologist: Oliver Hum RVT  Examination Guidelines: A complete evaluation includes B-mode imaging, spectral  Doppler, color Doppler, and power Doppler as needed of all accessible portions of each vessel. Bilateral testing is considered an integral part of a complete examination. Limited examinations for reoccurring indications may be performed as noted. The reflux portion of the exam is performed with the patient in reverse Trendelenburg.  Right Venous Findings: +---------+---------------+---------+-----------+----------+-------+  CompressibilityPhasicitySpontaneityPropertiesSummary +---------+---------------+---------+-----------+----------+-------+ CFV      Full           Yes      Yes                          +---------+---------------+---------+-----------+----------+-------+ SFJ      Full                                                 +---------+---------------+---------+-----------+----------+-------+ FV Prox  Full                                                 +---------+---------------+---------+-----------+----------+-------+ FV Mid   Full                                                 +---------+---------------+---------+-----------+----------+-------+ FV DistalFull                                                 +---------+---------------+---------+-----------+----------+-------+ PFV      Full                                                 +---------+---------------+---------+-----------+----------+-------+ POP      Full           Yes      Yes                          +---------+---------------+---------+-----------+----------+-------+ PTV      Full                                                 +---------+---------------+---------+-----------+----------+-------+ PERO     Full                                                 +---------+---------------+---------+-----------+----------+-------+  Left Venous Findings: +---------+---------------+---------+-----------+----------+-------+           CompressibilityPhasicitySpontaneityPropertiesSummary +---------+---------------+---------+-----------+----------+-------+ CFV      Full           Yes      Yes                          +---------+---------------+---------+-----------+----------+-------+ SFJ      Full                                                 +---------+---------------+---------+-----------+----------+-------+  FV Prox  Full                                                 +---------+---------------+---------+-----------+----------+-------+ FV Mid   Full                                                 +---------+---------------+---------+-----------+----------+-------+ FV DistalFull                                                 +---------+---------------+---------+-----------+----------+-------+ PFV      Full                                                 +---------+---------------+---------+-----------+----------+-------+ POP      Full           Yes      Yes                          +---------+---------------+---------+-----------+----------+-------+ PTV      Full                                                 +---------+---------------+---------+-----------+----------+-------+ PERO     Full                                                 +---------+---------------+---------+-----------+----------+-------+    Summary: Right: There is no evidence of deep vein thrombosis in the lower extremity. No cystic structure found in the popliteal fossa. Left: There is no evidence of deep vein thrombosis in the lower extremity. No cystic structure found in the popliteal fossa.  *See table(s) above for measurements and observations. Electronically signed by Quay Burow MD on 01/12/2018 at 4:45:11 PM.    Final    EKG Interpretation  Date/Time:  Wednesday January 12 2018 14:41:07 EST Ventricular Rate:  81 PR Interval:    QRS Duration: 87 QT Interval:  395 QTC Calculation: 459 R  Axis:   37 Text Interpretation:  Atrial fibrillation Ventricular premature complex When compared to prior, similar Afib No STEMI Confirmed by Antony Blackbird [phone]) on 01/12/2018 2:46:04 PM   Pending Labs Unresulted Labs (From admission, onward)    Start     Ordered   01/13/18 0500  Protime-INR  Daily,   R     01/12/18 1620   01/13/18 0500  CBC  Daily,   R     01/12/18 1620   01/13/18 0000  Heparin level (unfractionated)  Once-Timed,   R     01/12/18 1620   Signed and Held  Basic metabolic panel  Tomorrow morning,   R     Signed and  Held   Signed and Held  CBC  Tomorrow morning,   R     Signed and Held          Vitals/Pain Today's Vitals   01/12/18 1515 01/12/18 1555 01/12/18 1704 01/12/18 1739  BP:  126/79 116/84 116/84  Pulse: 91 99 92 (!) 106  Resp: (!) 21 20 17 20   Temp:      TempSrc:      SpO2: 100% 98% 97% 98%    Isolation Precautions Droplet precaution  Medications Medications  heparin ADULT infusion 100 units/mL (25000 units/234mL sodium chloride 0.45%) (1,000 Units/hr Intravenous Not Given 01/12/18 1740)    Mobility walks with person assist

## 2018-01-12 NOTE — Assessment & Plan Note (Signed)
CTA with small PE  Patient routed to [facility] emergency room for further evaluation.  Patient was unable to be directly admitted due to no beds being available per PEG control.

## 2018-01-13 ENCOUNTER — Inpatient Hospital Stay (HOSPITAL_COMMUNITY): Payer: Medicare Other

## 2018-01-13 DIAGNOSIS — C61 Malignant neoplasm of prostate: Secondary | ICD-10-CM

## 2018-01-13 DIAGNOSIS — I739 Peripheral vascular disease, unspecified: Secondary | ICD-10-CM

## 2018-01-13 DIAGNOSIS — J84112 Idiopathic pulmonary fibrosis: Principal | ICD-10-CM

## 2018-01-13 DIAGNOSIS — E669 Obesity, unspecified: Secondary | ICD-10-CM

## 2018-01-13 DIAGNOSIS — I351 Nonrheumatic aortic (valve) insufficiency: Secondary | ICD-10-CM

## 2018-01-13 DIAGNOSIS — M797 Fibromyalgia: Secondary | ICD-10-CM

## 2018-01-13 DIAGNOSIS — J45909 Unspecified asthma, uncomplicated: Secondary | ICD-10-CM

## 2018-01-13 DIAGNOSIS — N529 Male erectile dysfunction, unspecified: Secondary | ICD-10-CM

## 2018-01-13 DIAGNOSIS — I2699 Other pulmonary embolism without acute cor pulmonale: Secondary | ICD-10-CM

## 2018-01-13 DIAGNOSIS — Z86010 Personal history of colonic polyps: Secondary | ICD-10-CM

## 2018-01-13 DIAGNOSIS — Z86718 Personal history of other venous thrombosis and embolism: Secondary | ICD-10-CM

## 2018-01-13 DIAGNOSIS — I2782 Chronic pulmonary embolism: Secondary | ICD-10-CM

## 2018-01-13 DIAGNOSIS — Z87442 Personal history of urinary calculi: Secondary | ICD-10-CM

## 2018-01-13 DIAGNOSIS — I4891 Unspecified atrial fibrillation: Secondary | ICD-10-CM

## 2018-01-13 DIAGNOSIS — Z86711 Personal history of pulmonary embolism: Secondary | ICD-10-CM

## 2018-01-13 DIAGNOSIS — I2693 Single subsegmental pulmonary embolism without acute cor pulmonale: Secondary | ICD-10-CM

## 2018-01-13 DIAGNOSIS — Z7901 Long term (current) use of anticoagulants: Secondary | ICD-10-CM

## 2018-01-13 DIAGNOSIS — Z8719 Personal history of other diseases of the digestive system: Secondary | ICD-10-CM

## 2018-01-13 DIAGNOSIS — I361 Nonrheumatic tricuspid (valve) insufficiency: Secondary | ICD-10-CM

## 2018-01-13 DIAGNOSIS — Z85828 Personal history of other malignant neoplasm of skin: Secondary | ICD-10-CM

## 2018-01-13 DIAGNOSIS — M129 Arthropathy, unspecified: Secondary | ICD-10-CM

## 2018-01-13 DIAGNOSIS — K219 Gastro-esophageal reflux disease without esophagitis: Secondary | ICD-10-CM

## 2018-01-13 DIAGNOSIS — Z9884 Bariatric surgery status: Secondary | ICD-10-CM

## 2018-01-13 LAB — BASIC METABOLIC PANEL
Anion gap: 13 (ref 5–15)
BUN: 27 mg/dL — AB (ref 8–23)
CO2: 21 mmol/L — ABNORMAL LOW (ref 22–32)
Calcium: 8.2 mg/dL — ABNORMAL LOW (ref 8.9–10.3)
Chloride: 101 mmol/L (ref 98–111)
Creatinine, Ser: 1.4 mg/dL — ABNORMAL HIGH (ref 0.61–1.24)
GFR calc Af Amer: 56 mL/min — ABNORMAL LOW (ref >60)
GFR calc non Af Amer: 48 mL/min — ABNORMAL LOW (ref >60)
Glucose, Bld: 204 mg/dL — ABNORMAL HIGH (ref 70–99)
POTASSIUM: 4.4 mmol/L (ref 3.5–5.1)
Sodium: 135 mmol/L (ref 135–145)

## 2018-01-13 LAB — PROTIME-INR
INR: 2.56
Prothrombin Time: 27.1 seconds — ABNORMAL HIGH (ref 11.4–15.2)

## 2018-01-13 LAB — CBC
HCT: 35.3 % — ABNORMAL LOW (ref 39.0–52.0)
HEMOGLOBIN: 11.4 g/dL — AB (ref 13.0–17.0)
MCH: 29.5 pg (ref 26.0–34.0)
MCHC: 32.3 g/dL (ref 30.0–36.0)
MCV: 91.5 fL (ref 80.0–100.0)
Platelets: 280 10*3/uL (ref 150–400)
RBC: 3.86 MIL/uL — ABNORMAL LOW (ref 4.22–5.81)
RDW: 13.2 % (ref 11.5–15.5)
WBC: 7.8 10*3/uL (ref 4.0–10.5)
nRBC: 0 % (ref 0.0–0.2)

## 2018-01-13 LAB — ECHOCARDIOGRAM COMPLETE
Height: 72 in
WEIGHTICAEL: 2832.4701 [oz_av]

## 2018-01-13 MED ORDER — FUROSEMIDE 10 MG/ML IJ SOLN
20.0000 mg | Freq: Once | INTRAMUSCULAR | Status: AC
  Administered 2018-01-13: 20 mg via INTRAVENOUS
  Filled 2018-01-13: qty 2

## 2018-01-13 MED ORDER — BENZONATATE 100 MG PO CAPS
200.0000 mg | ORAL_CAPSULE | Freq: Three times a day (TID) | ORAL | Status: DC | PRN
  Administered 2018-01-13 – 2018-01-15 (×4): 200 mg via ORAL
  Filled 2018-01-13 (×4): qty 2

## 2018-01-13 MED ORDER — WARFARIN SODIUM 4 MG PO TABS
4.0000 mg | ORAL_TABLET | Freq: Once | ORAL | Status: AC
  Administered 2018-01-13: 4 mg via ORAL
  Filled 2018-01-13: qty 1

## 2018-01-13 NOTE — Progress Notes (Signed)
ANTICOAGULATION CONSULT NOTE - Follow Up Consult  Pharmacy Consult for warfarin Indication: hx pulmonary embolus and DVT; new PE per Chest CTA on 12/4  Allergies  Allergen Reactions  . Sulfacetamide Sodium Swelling    throat swelling  . Latex Itching    Severe contact dermatitis  . Adhesive [Tape] Other (See Comments)    Tears skin-- "No tape of any kind, they all tear skin right off"    Patient Measurements: Height: 6' (182.9 cm) Weight: 177 lb 0.5 oz (80.3 kg) IBW/kg (Calculated) : 77.6 Heparin Dosing Weight:   Vital Signs: Temp: 97.7 F (36.5 C) (12/05 0517) Temp Source: Oral (12/05 0517) BP: 120/88 (12/05 0517) Pulse Rate: 100 (12/05 0517)  Labs: Recent Labs    01/10/18 1657 01/12/18 1508 01/13/18 0351  HGB 13.1 11.2* 11.4*  HCT 39.8 35.6* 35.3*  PLT 340.0 309 280  LABPROT  --  28.7* 27.1*  INR  --  2.75 2.56  CREATININE 1.40 1.46* 1.40*    Estimated Creatinine Clearance: 49.3 mL/min (A) (by C-G formula based on SCr of 1.4 mg/dL (H)).   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  CTA shows small PE despite therapeutic INR of 2.75 today (goal 2.5 - 3.5 per clinic records). LE doppler on 12/4 neg for DVT.  Warfarin resumed on admission.  Today, 01/13/2018: - INR is therapeutic at 2.56 - cbc is stable - no bleeding documented  Goal of Therapy:  INR 2.5-3.5 Monitor platelets by anticoagulation protocol: Yes   Plan:  - warfarin 4 mg PO x1 today - daily INR - monitor for s/s bleeding - consider consulting heme/onc for recom. regarding anticoag. since pt has new PE with therapeutic INR  Tajah Schreiner P 01/13/2018,9:49 AM

## 2018-01-13 NOTE — Consult Note (Signed)
[facility] NOTE  Patient Care Team: Jinny Sanders, MD as PCP - General Minus Breeding, MD as PCP - Cardiology (Cardiology) Himmelrich, Bryson Ha, RD (Inactive) as Dietitian (Bariatrics) Melida Quitter, MD as Consulting Physician (Otolaryngology) Delrae Rend, MD as Consulting Physician (Endocrinology) Kayleen Memos, PA-C as Physician Assistant (Gastroenterology) Brand Males, MD as Consulting Physician (Pulmonary Disease) Malena Catholic, MD as Referring Physician Leeroy Cha, MD as Consulting Physician (Neurosurgery) Tat, Eustace Quail, DO as Consulting Physician (Neurology) Hollice Espy, MD as Consulting Physician (Urology) Elvina Mattes, Rodman Key, DPM as Attending Physician (Podiatry) Jola Schmidt, MD as Consulting Physician (Ophthalmology) Melrose Nakayama, MD as Consulting Physician (Orthopedic Surgery)  HEME/ONC OVERVIEW: 1. 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 Stage IIIA (cT2N0M0) prostate cancer, intermediate risk (GS 7) -09/2017: GS 4+3 and 3+4 in 7/12 cores -Undergoing evaluation by [name] for prostate EBRT + ADT  PERTINENT NON-HEM/ONC PROBLEMS: 1. Atrial fibrillation on warfarin  2. Right total knee replacement in 04/2017   ASSESSMENT & PLAN:   Segmental/subsegmental PTE, chronicity unknown -I reviewed the patient's records in detail, including pulmonary clinic and admission notes -I also independently reviewed the radiologic images of CTA chest and abdomen/pelvis, and agree with the findings documented -In summary, patient presented to pulmonary clinic with several weeks of restive dyspnea and cough, for which CTA chest showed a tiny segmental/subsegmental  PTE in the LUL and a second potential minuscule focus of embolism in the distal pulmonary artery in the lingula; doppler negative for DVT in lower extremities  -He is on warfarin for A-fib, and INR was therapeutic on admission; reviewing of the patient's previous INRs indicated that he was mostly therapeutic on anticoagulation -From a symptom standpoint, as the LUL PTE is very small (and inconclusive presence of the 2nd tiny lingular PTE), it is very unlikely that the PTE is responsible for the patient's several weeks of worsening dyspnea and cough  -The patient has multiple risk factors for VTE, including recent knee surgery, his frequent long-distance travel, and prostate cancer; as the chronicity of the PTE is unknown, it is not clear that patient truly "failed" warfarin, as the PTE could have occurred sometime ago -Given the unclear circumstance surrounding the PTE, the only approved anticoagulant is therapeutic Lovenox; however, patient is very hesitant about lifelong injection and the impact on his quality of life -I discussed with the patient that given his hx of gastric bypass, he is at risk for various nutritional deficiencies, including Vitamin K, which can theoretically affect the pharmacokinetics of warfarin -If he is not willing to proceed with Lovenox, then alternative options include oral anticoagulants such as Eliquis, which is both approved to treat A-fib (non-valvular) and shown to be effective in treating VTE, including in the setting of malignancy; he also understood that there is no data regarding changing from warfarin to Eliquis for recurrent VTE on anticoagulation   -After lengthy discussions regarding the pros and cons, the patient agreed to change anticoagulation to Eliquis  -Prior to changing anticoagulation, I recommend contacting his cardiologist to ensure that Eliquis is also appropriate for A-fib (ie confirm that his A-fib is NOT due to valvular disease) -If his cardiologist  agrees with Eliquis, then Eliquis may be started after INR is < 2  Atrial fibrillation -Patient has been treated with warfarin with mostly therapeutic INR's -For detailed discussion regarding anticoagulant options, see above -If his cardiologist is agreeable to changing to Eliquis, then patient may be transitioned to Eliquis once INR < 2    Thank you for the opportunity to participate in [name]'s care.  Please do not hesitate to contact me if there are any questions. Hematology will sign off now, but the patient was provided contact information to set up appt with hematology clinic upon discharge if he would like to continue outpatient follow-up.  Tish Men, MD 01/13/2018 11:28 AM   CHIEF COMPLAINTS/PURPOSE OF CONSULTATION:  "I am still short of breath"  HISTORY OF PRESENTING ILLNESS:  [name] is a 77 year old male with a history of atrial fibrillation on warfarin, recently diagnosed stage III prostate cancer, and a remote history of DVT/PTE in 1990s, who was admitted for the management of worsening dyspnea and an incidental small PTE on CTA.  The patient was recently evaluated by his pulmonologist for several weeks of progressive dyspnea and cough, associated with occasional wheezing and chills, but he denied any sputum production or recent symptoms of infection.  Due to the concern for possible malignancy versus VTE, CTA chest was done, which showed an incidental tiny segmental/subsegmental PTE in the L UL, as well as a possible minuscule PTE in the lingula.  In the ER, patient was very tachypneic and dyspneic, and admitted for evaluation and management of hypoxic respiratory distress.  Patient reports that he still has mild persistent shortness of breath and coughing, exacerbated by speaking for long period of time, but he denies any fever, chill, night sweats, weight change, chest pain, or sputum production.  Off note, patient drives to [place] as part of his job 3 times a  week, but reports that he tries to ambulate every 2 hours or so during the drive due to his history of RLE DVT and PTE.   MEDICAL HISTORY:  Past Medical History:  Diagnosis Date  . Arthritis    hands   . Asthma   . Chronic atrial fibrillation    a.fib on warfarin- Dr. Percival Spanish follows  . Diverticulosis of colon (without mention of hemorrhage)   . Dysrhythmia    a fib  . ED (erectile dysfunction)   . Fibromyalgia   . GERD (gastroesophageal reflux disease)   . Hard of hearing    wears hearing aids  . Headache   . History of blood clots    behind right knee and then went into left lung 42yrs ago  . History of colon polyps   . History of kidney stones   . Hx of diabetes mellitus    "no longer diabetic since gastric bypass" - no longer taking metformin"  in 2 months "problems with low blood sugar now"  . Hx of gout    but doesn't take any meds  . Inflammation of colonic mucosa    recent admission and [facility] from [facility]-remains on oral antibiotic  . Obesity   . Peripheral vascular disease ([place])   . Pneumonia    last time about 28yrs ago  . Prostate cancer ([place])   . Pulmonary embolism ([place])    over 10 yrs ago "many years ago"  . Restless legs syndrome (RLS)   . Skin cancer   . Unspecified asthma(493.90)    as a child  . Unspecified essential hypertension    hx of no longer on medication due to gastric bypass  SURGICAL HISTORY: Past Surgical History:  Procedure Laterality Date  . ANTERIOR CERVICAL DECOMP/DISCECTOMY FUSION N/A 05/09/2013   Procedure: ANTERIOR CERVICAL DECOMPRESSION/DISCECTOMY FUSION CERVICAL THREE-FOUR,CERVICAL FOUR-FIVE,CERVICAL FIVE-SIX;  Surgeon: Floyce Stakes, MD;  Location: [facility]URO [facility]S;  Service: Neurosurgery;  Laterality: N/A;  . ARTERY REPAIR     Left forearm  . BACK SURGERY     x 3  . BREATH TEK H PYLORI  01/08/2011   Procedure: BREATH TEK H PYLORI;  Surgeon: Pedro Earls, MD;  Location: [facility];  Service: General;   Laterality: N/A;  . CARDIOVERSION     x 2 attempts-unsuccessful.  [name] CATARACT EXTRACTION, BILATERAL    . COLONOSCOPY    . CYSTOSCOPY    . ESOPHAGOGASTRODUODENOSCOPY (EGD) WITH PROPOFOL N/A 09/21/2014   Procedure: ESOPHAGOGASTRODUODENOSCOPY (EGD) WITH PROPOFOL;  Surgeon: Manya Silvas, MD;  Location: [facility] ENDOSCOPY;  Service: Endoscopy;  Laterality: N/A;  . EYE SURGERY     cataract bil  . FOOT SURGERY     Left foot   . GASTRIC ROUX-EN-Y  08/11/2011   Procedure: LAPAROSCOPIC ROUX-EN-Y GASTRIC BYPASS WITH UPPER ENDOSCOPY;  Surgeon: Pedro Earls, MD;  Location: [facility] ORS;  Service: General;  Laterality: N/A;  . HAND SURGERY     LEFT  . injections in back     x 18  . JOINT REPLACEMENT    . KNEE SURGERY Left    x 4  . KNEE SURGERY Left    arthroscopy  . LAPAROSCOPIC INTERNAL HERNIA REPAIR N/A 03/08/2015   Procedure: LAPAROSCOPIC INTERNAL HERNIA REPAIR ;  Surgeon: [name], MD;  Location: [facility] ORS;  Service: General;  Laterality: N/A;  . LEG SURGERY     FOR NECROTIZING FASCITIS L LEG AND GROIN  . PANNICULECTOMY N/A 08/25/2016   Procedure: PANNICULECTOMY;  Surgeon: [name], MD;  Location: [facility] ORS;  Service: General;  Laterality: N/A;  . PENILE PROSTHESIS IMPLANT N/A 07/02/2014   Procedure: PENILE PROTHESIS INFLATABLE 3 PIECE (COLOPLAST) SCROTAL APPROACH;  Surgeon: Kathie Rhodes, MD;  Location: [facility] ORS;  Service: Urology;  Laterality: N/A;  . PENILE PROSTHESIS IMPLANT N/A 11/23/2014   Procedure: EXPLORATION AND REVISION OF PENILE PROSTHESIS;  Surgeon: Kathie Rhodes, MD;  Location: [facility] ORS;  Service: Urology;  Laterality: N/A;  . PICC INSERTION W/OUT PORT/PUMP    . REPLACEMENT TOTAL KNEE Left    x 2  . SHOULDER ARTHROSCOPY    . SHOULDER SURGERY Left 2014  . TONSILLECTOMY    . TOTAL KNEE ARTHROPLASTY Right 04/27/2017  . TOTAL KNEE ARTHROPLASTY Right 04/27/2017   Procedure: TOTAL KNEE ARTHROPLASTY;  Surgeon: Melrose Nakayama, MD;  Location: [place] [facility];  Service: Orthopedics;  Laterality:  Right;  . TOTAL SHOULDER ARTHROPLASTY Left 01/26/2013   Procedure: TOTAL SHOULDER ARTHROPLASTY;  Surgeon: Nita Sells, MD;  Location: [place];  Service: Orthopedics;  Laterality: Left;  Left total shoulder arthroplasty    SOCIAL HISTORY: Social History   Socioeconomic History  . Marital status: Married    Spouse name: Not on file  . Number of children: 1  . Years of education: Not on file  . Highest education level: Not on file  Occupational History  . Occupation: Retired    Comment: Investment banker, operational  . Financial resource strain: Not on file  . Food insecurity:    Worry: Not on file    Inability: Not on file  . Transportation needs:    Medical: Not on file    Non-medical: Not on file  Tobacco Use  . Smoking status: Never Smoker  . Smokeless tobacco: Never Used  Substance and Sexual Activity  . Alcohol use: No    Alcohol/week: 0.0 standard drinks  . Drug use: No  . Sexual activity: Yes  Lifestyle  . Physical activity:    Days per week: Not on file    Minutes per session: Not on file  . Stress: Not on file  Relationships  . Social connections:    Talks on phone: Not on file    Gets together: Not on file    Attends religious service: Not on file    Active member of club or organization: Not on file    Attends meetings of clubs or organizations: Not on file    Relationship status: Not on file  . Intimate partner violence:    Fear of current or ex partner: Not on file    Emotionally abused: Not on file    Physically abused: Not on file    Forced sexual activity: Not on file  Other Topics Concern  . Not on file  Social History Narrative   DIET: 3 meals, F&V, some water, crystal light, No fast food   Exercise: walks treadmill 30 min      1 Caffeine drinks daily       No living will.           FAMILY HISTORY: Family History  Problem Relation Age of Onset  . Uterine cancer Mother        mets  . Breast cancer Mother   . Cancer Mother         cervical  . Emphysema Father   . Alzheimer's disease Sister   . Prostate cancer Brother   . Heart attack Brother   . Colon cancer Brother 36    ALLERGIES:  is allergic to sulfacetamide sodium; latex; and adhesive [tape].  MEDICATIONS:  Current Facility-Administered Medications  Medication Dose Route Frequency Provider Last Rate Last Dose  . acarbose (PRECOSE) tablet 50 mg  50 mg Oral TID WC Arrien, Jimmy Picket, MD   50 mg at 01/13/18 0300  . acetaminophen (TYLENOL) tablet 650 mg  650 mg Oral Q6H PRN Arrien, Jimmy Picket, MD       Or  . acetaminophen (TYLENOL) suppository 650 mg  650 mg Rectal Q6H PRN Arrien, Jimmy Picket, MD      . atorvastatin (LIPITOR) tablet 80 mg  80 mg Oral QHS Tawni Millers, MD   80 mg at 01/12/18 2022  . chlorpheniramine-HYDROcodone (TUSSIONEX) 10-8 MG/5ML suspension 1.5 mL  1.5 mL Oral Q12H PRN Arrien, Jimmy Picket, MD   1.5 mL at 01/12/18 2030  . famotidine (PEPCID) tablet 20 mg  20 mg Oral Daily Arrien, Jimmy Picket, MD   20 mg at 01/13/18 1027  . FLUoxetine (PROZAC) capsule 60 mg  60 mg Oral QHS Arrien, Jimmy Picket, MD   60 mg at 01/12/18 2022  . fluticasone furoate-vilanterol (BREO ELLIPTA) 200-25 MCG/INH 1 puff  1 puff Inhalation Daily Arrien, Jimmy Picket, MD   1 puff at 01/13/18 0827  . furosemide (LASIX) injection 20 mg  20 mg Intravenous Once Sheikh, Omair Latif, DO      . gabapentin (NEURONTIN) capsule 300 mg  300 mg Oral BID Tawni Millers, MD   300 mg at 01/13/18 1026  . ipratropium-albuterol (DUONEB) 0.5-2.5 (3) MG/3ML nebulizer solution 3 mL  3 mL Nebulization TID Arrien, Jimmy Picket, MD   3 mL at 01/13/18 0827  .  ipratropium-albuterol (DUONEB) 0.5-2.5 (3) MG/3ML nebulizer solution 3 mL  3 mL Nebulization Q2H PRN Arrien, Jimmy Picket, MD      . methylPREDNISolone sodium succinate (SOLU-MEDROL) 125 mg/2 mL injection 60 mg  60 mg Intravenous Q6H Arrien, Jimmy Picket, MD   60 mg at 01/13/18 [id number]  .  omega-3 acid ethyl esters (LOVAZA) capsule 1 g  1 g Oral Daily Arrien, Jimmy Picket, MD   1 g at 01/13/18 1026  . ondansetron (ZOFRAN) tablet 4 mg  4 mg Oral Q6H PRN Arrien, Jimmy Picket, MD       Or  . ondansetron [facility]) injection 4 mg  4 mg Intravenous Q6H PRN Arrien, Jimmy Picket, MD      . pantoprazole (PROTONIX) EC tablet 40 mg  40 mg Oral Daily Arrien, Jimmy Picket, MD   40 mg at 01/13/18 1027  . sucralfate (CARAFATE) tablet 1 g  1 g Oral TID AC Arrien, Jimmy Picket, MD   1 g at 01/13/18 [phone]  . tamsulosin (FLOMAX) capsule 0.4 mg  0.4 mg Oral QHS Arrien, Jimmy Picket, MD   0.4 mg at 01/12/18 2022  . tiZANidine (ZANAFLEX) tablet 4 mg  4 mg Oral Q6H PRN Arrien, Jimmy Picket, MD      . vitamin E capsule 400 Units  400 Units Oral Daily Tawni Millers, MD   400 Units at 01/13/18 1026  . warfarin (COUMADIN) tablet 4 mg  4 mg Oral ONCE-1800 Pham, Anh P, RPH      . Warfarin - Pharmacist Dosing Inpatient   Does not apply q1800 Polly Cobia, RPH      . zolpidem (AMBIEN) tablet 5 mg  5 mg Oral QHS PRN Arrien, Jimmy Picket, MD   5 mg at 01/12/18 2029    REVIEW OF SYSTEMS:   Constitutional: ( - ) fevers, ( - )  chills , ( - ) night sweats Eyes: ( - ) blurriness of vision, ( - ) double vision, ( - ) watery eyes Ears, nose, mouth, throat, and face: ( - ) mucositis, ( - ) sore throat Respiratory: ( + ) cough, ( + ) dyspnea, ( - ) wheezes Cardiovascular: ( - ) palpitation, ( - ) chest discomfort, ( - ) lower extremity swelling Gastrointestinal:  ( - ) nausea, ( - ) heartburn, ( - ) change in bowel habits Skin: ( - ) abnormal skin rashes Lymphatics: ( - ) new lymphadenopathy, ( + ) easy bruising Neurological: ( - ) numbness, ( - ) tingling, ( - ) new weaknesses Behavioral/Psych: ( - ) mood change, ( - ) new changes  All other systems were reviewed with the patient and are negative.  PHYSICAL EXAMINATION: ECOG PERFORMANCE STATUS: 1 - Symptomatic but completely  ambulatory  Vitals:   01/13/18 0517 01/13/18 0827  BP: 120/88   Pulse: 100   Resp: 16   Temp: 97.7 F (36.5 C)   SpO2: 96% 96%   Filed Weights   01/13/18 0106  Weight: 177 lb 0.5 oz (80.3 kg)    GENERAL: alert, no distress and comfortable, slightly frail-appearing  SKIN: skin color, texture, turgor are normal, no rashes or significant lesions EYES: conjunctiva are pink and non-injected, sclera clear OROPHARYNX: no exudate, no erythema; lips, buccal mucosa, and tongue normal  NECK: supple, non-tender LYMPH:  no palpable lymphadenopathy in the cervical or axillary LUNGS: clear to auscultation and percussion with normal breathing effort HEART: regular rate & rhythm, no murmurs, no lower extremity edema ABDOMEN: soft,  non-tender, non-distended, normal bowel sounds Musculoskeletal: no cyanosis of digits and no clubbing  PSYCH: alert & oriented x 3, fluent speech NEURO: no focal motor/sensory deficits  LABORATORY DATA:  I have reviewed the data as listed Lab Results  Component Value Date   WBC 7.8 01/13/2018   HGB 11.4 (L) 01/13/2018   HCT 35.3 (L) 01/13/2018   MCV 91.5 01/13/2018   PLT 280 01/13/2018   Lab Results  Component Value Date   NA 135 01/13/2018   K 4.4 01/13/2018   CL 101 01/13/2018   CO2 21 (L) 01/13/2018    RADIOGRAPHIC STUDIES: I have personally reviewed the radiological images as listed and agreed with the findings in the report. Dg Chest 2 View  Result Date: 12/31/2017 CLINICAL DATA:  Shortness of breath for 3 weeks. EXAM: CHEST - 2 VIEW COMPARISON:  01/06/2017 FINDINGS: There is hazy airspace disease in the right and left lower lobes concerning for pneumonia. There is no pleural effusion or pneumothorax. The heart and mediastinal contours are unremarkable. The osseous structures are unremarkable. IMPRESSION: Hazy airspace disease in the right and left lower lobes concerning for pneumonia. Electronically Signed   By: Kathreen Devoid   On: 12/31/2017 13:13    Ct Angio Chest W/cm &/or Wo Cm  Result Date: 01/12/2018 CLINICAL DATA:  Cough with dyspnea and wheezing. New diagnosis of prostate cancer. Abdominal pain. EXAM: CT ANGIOGRAPHY CHEST CT ABDOMEN AND PELVIS WITH CONTRAST TECHNIQUE: Multidetector CT imaging of the chest was performed using the standard protocol during bolus administration of intravenous contrast. Multiplanar CT image reconstructions and MIPs were obtained to evaluate the vascular anatomy. Multidetector CT imaging of the abdomen and pelvis was performed using the standard protocol during bolus administration of intravenous contrast. CONTRAST:  184mL ISOVUE-370 IOPAMIDOL (ISOVUE-370) INJECTION 76% COMPARISON:  Pelvic CT 10/25/2017. FINDINGS: CTA CHEST FINDINGS Cardiovascular: Heart size upper normal. Coronary artery calcification is evident. Atherosclerotic calcification is noted in the wall of the thoracic aorta. Right main pulmonary artery measures 3.3 cm diameter. Left main pulmonary artery measures up to 2.7 cm diameter. Pulmonary arteries are well opacified. No right-sided filling defects to suggest acute pulmonary embolus. Patient does have a single small focus of segmental to subsegmental pulmonary embolus in the left upper lobe (image 142/series 11). Potential 3-4 mm embolic focus identified in distal pulmonary artery to the lingula (image 203/series 11). No other left-sided pulmonary embolic disease. Mediastinum/Nodes: Upper normal to mild mediastinal lymphadenopathy evident. 11 mm short axis right paratracheal node is partially obscured by beam hardening artifact from contrast in the SVC. The the the 14 mm AP window lymph node is visible on 45/4. Small lymph nodes evident in each hilum with 12 mm short axis inferior right hilar lymph node evident. Probable irregular non circumferential wall thickening in the distal esophagus (57/4) There is no axillary lymphadenopathy. Lungs/Pleura: The central tracheobronchial airways are patent. Basilar  predominant interstitial opacity noted with subpleural reticulation, alveolar ground-glass opacity, and diffuse bronchiectasis. Areas of peripheral patchy ground-glass attenuation are seen in the upper lungs bilaterally. This ground-glass attenuation is associated with bronchiectasis. No pleural effusion. Musculoskeletal: No worrisome lytic or sclerotic osseous abnormality. Patient is status post left shoulder replacement. Review of the MIP images confirms the above findings. CT ABDOMEN and PELVIS FINDINGS Hepatobiliary: No focal abnormality within the liver parenchyma. Gallbladder is distended. No intrahepatic or extrahepatic biliary dilation. Pancreas: No focal mass lesion. No dilatation of the main duct. No intraparenchymal cyst. No peripancreatic edema. Spleen: Wedge-shaped area of decreased  perfusion in the the peripheral spleen suggests infarct, age indeterminate. Adrenals/Urinary Tract: No adrenal nodule or mass. 4 cm cyst identified lower pole right kidney. 8 mm well-defined low-density lesion in the lower interpolar region of the left kidney is too small to characterize but likely a cyst. No evidence for hydroureter. The urinary bladder appears normal for the degree of distention. Stomach/Bowel: Status post gastric bypass. Duodenum is normally positioned as is the ligament of Treitz. No small bowel wall thickening. No small bowel dilatation. The terminal ileum is normal. The appendix is not visualized, but there is no edema or inflammation in the region of the cecum. Diverticular changes are noted in the left colon without evidence of diverticulitis. Vascular/Lymphatic: There is abdominal aortic atherosclerosis without aneurysm. There is no gastrohepatic or hepatoduodenal ligament lymphadenopathy. No intraperitoneal or retroperitoneal lymphadenopathy. No pelvic sidewall lymphadenopathy. Reproductive: Prostate gland unremarkable. Penile prosthesis evident with irregular contour of the reservoir. Reservoir  rupture not excluded. Other: No intraperitoneal free fluid. Musculoskeletal: Bilateral SI joint effusion evident. Lower lumbar fusion hardware associated. Patient is status post vertebral augmentation at L1. Review of the MIP images confirms the above findings. IMPRESSION: 1. Tiny segmental to subsegmental pulmonary embolus in the left upper lobe with potential miniscule focus of embolism in a distal pulmonary artery to the lingula. 2. Advanced interstitial, basilar predominant fibrotic lung disease. Scattered areas of ground-glass attenuation are associated with bronchiectasis. 3. Enlargement of the main pulmonary arteries suggests pulmonary arterial hypertension. 4. Asymmetric irregular wall thickening noted distal esophagus. Distal esophageal neoplasm a concern. 5. Mild mediastinal and hilar lymphadenopathy, possibly reactive. In the setting of neoplasm, metastatic disease would be a concern. 6. 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 7. Status post gastric bypass. These results will be called to the ordering clinician or representative by the Radiologist Assistant, and communication documented in the PACS or zVision Dashboard. Electronically Signed   By: Misty Stanley M.D.   On: 01/12/2018 12:25   Ct Abdomen Pelvis W Contrast  Result Date: 01/12/2018 CLINICAL DATA:  Cough with dyspnea and wheezing. New diagnosis of prostate cancer. Abdominal pain. EXAM: CT ANGIOGRAPHY CHEST CT ABDOMEN AND PELVIS WITH CONTRAST TECHNIQUE: Multidetector CT imaging of the chest was performed using the standard protocol during bolus administration of intravenous contrast. Multiplanar CT image reconstructions and MIPs were obtained to evaluate the vascular anatomy. Multidetector CT imaging of the abdomen and pelvis was performed using the standard protocol during bolus administration of intravenous contrast. CONTRAST:  165mL ISOVUE-370 IOPAMIDOL (ISOVUE-370) INJECTION 76%  COMPARISON:  Pelvic CT 10/25/2017. FINDINGS: CTA CHEST FINDINGS Cardiovascular: Heart size upper normal. Coronary artery calcification is evident. Atherosclerotic calcification is noted in the wall of the thoracic aorta. Right main pulmonary artery measures 3.3 cm diameter. Left main pulmonary artery measures up to 2.7 cm diameter. Pulmonary arteries are well opacified. No right-sided filling defects to suggest acute pulmonary embolus. Patient does have a single small focus of segmental to subsegmental pulmonary embolus in the left upper lobe (image 142/series 11). Potential 3-4 mm embolic focus identified in distal pulmonary artery to the lingula (image 203/series 11). No other left-sided pulmonary embolic disease. Mediastinum/Nodes: Upper normal to mild mediastinal lymphadenopathy evident. 11 mm short axis right paratracheal node is partially obscured by beam hardening artifact from contrast in the SVC. The the the 14 mm AP window lymph node is visible on 45/4. Small lymph nodes evident in each hilum with 12 mm short axis inferior right hilar lymph node  evident. Probable irregular non circumferential wall thickening in the distal esophagus (57/4) There is no axillary lymphadenopathy. Lungs/Pleura: The central tracheobronchial airways are patent. Basilar predominant interstitial opacity noted with subpleural reticulation, alveolar ground-glass opacity, and diffuse bronchiectasis. Areas of peripheral patchy ground-glass attenuation are seen in the upper lungs bilaterally. This ground-glass attenuation is associated with bronchiectasis. No pleural effusion. Musculoskeletal: No worrisome lytic or sclerotic osseous abnormality. Patient is status post left shoulder replacement. Review of the MIP images confirms the above findings. CT ABDOMEN and PELVIS FINDINGS Hepatobiliary: No focal abnormality within the liver parenchyma. Gallbladder is distended. No intrahepatic or extrahepatic biliary dilation. Pancreas: No focal  mass lesion. No dilatation of the main duct. No intraparenchymal cyst. No peripancreatic edema. Spleen: Wedge-shaped area of decreased perfusion in the the peripheral spleen suggests infarct, age indeterminate. Adrenals/Urinary Tract: No adrenal nodule or mass. 4 cm cyst identified lower pole right kidney. 8 mm well-defined low-density lesion in the lower interpolar region of the left kidney is too small to characterize but likely a cyst. No evidence for hydroureter. The urinary bladder appears normal for the degree of distention. Stomach/Bowel: Status post gastric bypass. Duodenum is normally positioned as is the ligament of Treitz. No small bowel wall thickening. No small bowel dilatation. The terminal ileum is normal. The appendix is not visualized, but there is no edema or inflammation in the region of the cecum. Diverticular changes are noted in the left colon without evidence of diverticulitis. Vascular/Lymphatic: There is abdominal aortic atherosclerosis without aneurysm. There is no gastrohepatic or hepatoduodenal ligament lymphadenopathy. No intraperitoneal or retroperitoneal lymphadenopathy. No pelvic sidewall lymphadenopathy. Reproductive: Prostate gland unremarkable. Penile prosthesis evident with irregular contour of the reservoir. Reservoir rupture not excluded. Other: No intraperitoneal free fluid. Musculoskeletal: Bilateral SI joint effusion evident. Lower lumbar fusion hardware associated. Patient is status post vertebral augmentation at L1. Review of the MIP images confirms the above findings. IMPRESSION: 1. Tiny segmental to subsegmental pulmonary embolus in the left upper lobe with potential miniscule focus of embolism in a distal pulmonary artery to the lingula. 2. Advanced interstitial, basilar predominant fibrotic lung disease. Scattered areas of ground-glass attenuation are associated with bronchiectasis. 3. Enlargement of the main pulmonary arteries suggests pulmonary arterial hypertension.  4. Asymmetric irregular wall thickening noted distal esophagus. Distal esophageal neoplasm a concern. 5. Mild mediastinal and hilar lymphadenopathy, possibly reactive. In the setting of neoplasm, metastatic disease would be a concern. 6. 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 7. Status post gastric bypass. These results will be called to the ordering clinician or representative by the Radiologist Assistant, and communication documented in the PACS or zVision Dashboard. Electronically Signed   By: Misty Stanley M.D.   On: 01/12/2018 12:25   Vas [place] Lower Extremity Venous (dvt) (only [facility])  Result Date: 01/12/2018  Lower Venous Study Indications: Pulmonary embolism.  Performing Technologist: Oliver Hum RVT  Examination Guidelines: A complete evaluation includes B-mode imaging, spectral Doppler, color Doppler, and power Doppler as needed of all accessible portions of each vessel. Bilateral testing is considered an integral part of a complete examination. Limited examinations for reoccurring indications may be performed as noted. The reflux portion of the exam is performed with the patient in reverse Trendelenburg.  Right Venous Findings: +---------+---------------+---------+-----------+----------+-------+          CompressibilityPhasicitySpontaneityPropertiesSummary +---------+---------------+---------+-----------+----------+-------+ CFV      Full           Yes      Yes                          +---------+---------------+---------+-----------+----------+-------+  SFJ      Full                                                 +---------+---------------+---------+-----------+----------+-------+ FV Prox  Full                                                 +---------+---------------+---------+-----------+----------+-------+ FV Mid   Full                                                  +---------+---------------+---------+-----------+----------+-------+ FV DistalFull                                                 +---------+---------------+---------+-----------+----------+-------+ PFV      Full                                                 +---------+---------------+---------+-----------+----------+-------+ POP      Full           Yes      Yes                          +---------+---------------+---------+-----------+----------+-------+ PTV      Full                                                 +---------+---------------+---------+-----------+----------+-------+ PERO     Full                                                 +---------+---------------+---------+-----------+----------+-------+  Left Venous Findings: +---------+---------------+---------+-----------+----------+-------+          CompressibilityPhasicitySpontaneityPropertiesSummary +---------+---------------+---------+-----------+----------+-------+ CFV      Full           Yes      Yes                          +---------+---------------+---------+-----------+----------+-------+ SFJ      Full                                                 +---------+---------------+---------+-----------+----------+-------+ FV Prox  Full                                                 +---------+---------------+---------+-----------+----------+-------+  FV Mid   Full                                                 +---------+---------------+---------+-----------+----------+-------+ FV DistalFull                                                 +---------+---------------+---------+-----------+----------+-------+ PFV      Full                                                 +---------+---------------+---------+-----------+----------+-------+ POP      Full           Yes      Yes                           +---------+---------------+---------+-----------+----------+-------+ PTV      Full                                                 +---------+---------------+---------+-----------+----------+-------+ PERO     Full                                                 +---------+---------------+---------+-----------+----------+-------+    Summary: Right: There is no evidence of deep vein thrombosis in the lower extremity. No cystic structure found in the popliteal fossa. Left: There is no evidence of deep vein thrombosis in the lower extremity. No cystic structure found in the popliteal fossa.  *See table(s) above for measurements and observations. Electronically signed by Quay Burow MD on 01/12/2018 at 4:45:11 PM.    Final

## 2018-01-13 NOTE — Progress Notes (Signed)
Inpatient Diabetes Program Recommendations  AACE/ADA: New Consensus Statement on Inpatient Glycemic Control (2015)  Target Ranges:  Prepandial:   less than 140 mg/dL      Peak postprandial:   less than 180 mg/dL (1-2 hours)      Critically ill patients:  140 - 180 mg/dL   Results for [name], [name] (MRN [id number]) as of 01/13/2018 10:38  Ref. Range 01/12/2018 15:08 01/13/2018 03:51  Glucose Latest Ref Range: 70 - 99 mg/dL 140 (H) 204 (H)    Admit with: SOB/ Pulmonary fibrosis   History: DM  Home DM Meds: Acarbose 50 mg TID  Current Orders: Acarbose 50 mg TID      MD- Please consider starting Novolog Sensitive Correction Scale/ SSI (0-9 units) TID AC + HS      --Will follow patient during hospitalization--  Wyn Quaker RN, MSN, CDE Diabetes Coordinator Inpatient Glycemic Control Team Team Pager: [phone] (8a-5p)

## 2018-01-13 NOTE — Progress Notes (Signed)
  Echocardiogram 2D Echocardiogram has been performed.  [name] 01/13/2018, 9:59 AM

## 2018-01-13 NOTE — Consult Note (Addendum)
Name: [name] MRN: [id number] DOB: [birth year]    ADMISSION DATE:  01/12/2018 CONSULTATION DATE:  01/13/2018  REFERRING MD :  Alfredia Ferguson  CHIEF COMPLAINT:  Dyspnea & cough   HISTORY OF PRESENT ILLNESS: 77 year old never smoker referred from pulmonary office for admission due to abnormal CT angiogram.  He underwent initial consultation 12/2 with Dr. Lamonte Sakai for 5 weeks of shortness of breath and coughing.  He has been treated as outpatient with prednisone and azithromycin and Levaquin.  His Coumadin level was at goal.  He has been in Coumadin since the 90s for chronic atrial fibrillation and for VTE. CT angiogram and CT abdomen with contrast was scheduled which he underwent on 12/4 This showed tiny subsegmental pulmonary embolus in the left upper lobe and may be in the distal lingula but more importantly showed significant interstitial basilar predominant fibrotic lung disease and scattered areas of groundglass attenuation and traction bronchiectasis.  I personally reviewed his films and to be does also show some honeycombing. There was also thickening of the distal esophagus noted.  Patient complained of considerable fatigue and then had 2 falls and was sent in directly from the office for hospital admission  He has a history of pulmonary embolism more than 10 years ago.  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  He has lost 15 pounds over the past 6 weeks   SIGNIFICANT EVENTS    STUDIES:  CT angiogram chest 12/4 CT abdomen 12/4     PAST MEDICAL HISTORY :   has a past medical history of Arthritis, Asthma, Chronic atrial fibrillation, Diverticulosis of colon (without mention of hemorrhage), Dysrhythmia, ED (erectile dysfunction), Fibromyalgia, GERD (gastroesophageal reflux disease), Hard of hearing, Headache, History of blood clots, History of colon polyps, History of kidney stones, diabetes mellitus, gout, Inflammation of colonic  mucosa, Obesity, Peripheral vascular disease ([place]), Pneumonia, Prostate cancer ([place]), Pulmonary embolism ([place]), Restless legs syndrome (RLS), Skin cancer, Unspecified asthma(493.90), and Unspecified essential hypertension.  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); Shoulder arthroscopy; Shoulder surgery (Left, 2014); Back surgery; Knee surgery (Left); Eye surgery; injections in back; Colonoscopy; Cystoscopy; Total shoulder arthroplasty (Left, 01/26/2013); Anterior cervical decomp/discectomy fusion (N/A, 05/09/2013); Penile prosthesis implant (N/A, 07/02/2014); Esophagogastroduodenoscopy (egd) with propofol (N/A, 09/21/2014); Penile prosthesis implant (N/A, 11/23/2014); Cataract extraction, bilateral; Tonsillectomy; Cardioversion; Laparoscopic internal hernia repair (N/A, 03/08/2015); Panniculectomy (N/A, 08/25/2016); Joint replacement; Total knee arthroplasty (Right, 04/27/2017); Total knee arthroplasty (Right, 04/27/2017); and PICC Insertion w/out Port/Pump. Prior to Admission medications   Medication Sig Start Date End Date Taking? Authorizing Provider  acarbose (PRECOSE) 50 MG tablet Take 50 mg by mouth 3 (three) times daily with meals.   Yes [provider]  atorvastatin (LIPITOR) 80 MG tablet Take 1 tablet (80 mg total) by mouth at bedtime. 02/18/17  Yes Minus Breeding, MD  chlorpheniramine-HYDROcodone (TUSSIONEX) 10-8 MG/5ML SUER Take 5 mLs by mouth every 12 (twelve) hours as needed for cough. Patient taking differently: Take 1.5 mLs by mouth every 12 (twelve) hours as needed for cough.  01/05/18  Yes Lesleigh Noe, MD  FLUoxetine (PROZAC) 20 MG capsule TAKE 3 CAPSULES BY MOUTH EVERYDAY AT BEDTIME Patient taking differently: Take 60 mg by mouth at bedtime.  01/10/18  Yes Bedsole, Amy E, MD  Fluticasone-Salmeterol (ADVAIR  DISKUS) 250-50 MCG/DOSE AEPB Inhale 1 puff  into the lungs 2 (two) times daily. Patient taking differently: Inhale 1 puff into the lungs 2 (two) times daily as needed (shortness of breath).  12/21/17  Yes Bedsole, Amy E, MD  furosemide (LASIX) 20 MG tablet Take 1 tablet (20 mg total) by mouth every other day. Patient taking differently: Take 20 mg by mouth daily.  08/10/17  Yes Duke, Tami Lin, PA  gabapentin (NEURONTIN) 300 MG capsule TAKE 1 CAPSULE BY MOUTH TWICE A DAY Patient taking differently: Take 300 mg by mouth 2 (two) times daily.  08/19/17  Yes Tat, Eustace Quail, DO  hydrocortisone 2.5 % cream Apply 1 application topically 2 (two) times daily as needed (itching).  12/09/17  Yes [provider]  hydroxypropyl methylcellulose / hypromellose (ISOPTO TEARS / GONIOVISC) 2.5 % ophthalmic solution Place 1 drop into both eyes 2 (two) times daily as needed for dry eyes.   Yes [provider]  ipratropium-albuterol (DUONEB) 0.5-2.5 (3) MG/3ML SOLN Take 3 mLs by nebulization every 4 (four) hours as needed. Patient taking differently: Take 3 mLs by nebulization 3 (three) times daily.  01/05/18  Yes Lesleigh Noe, MD  Omega-3 Fatty Acids (FISH OIL) 1000 MG CAPS Take 1,000 mg by mouth daily.   Yes [provider]  omeprazole (PRILOSEC) 40 MG capsule TAKE 1 CAPSULE (40 MG TOTAL) BY MOUTH DAILY. Patient taking differently: Take 40 mg by mouth at bedtime.  11/11/17  Yes Bedsole, Amy E, MD  PRESCRIPTION MEDICATION Apply 1 g topically 4 (four) times daily as needed (pain). Baclofen/gabapentin/lidocaine compounded cream. Applies 1g (1pump) to affected area up to four times a day for   Yes [provider]  primidone (MYSOLINE) 50 MG tablet 150 mg in the morning, 100 mg at night Patient taking differently: Take 100-150 mg by mouth See admin instructions. 150 mg in the morning, 100 mg at night 03/08/17  Yes Tat, Eustace Quail, DO  ranitidine (ZANTAC) 300 MG tablet Take 300 mg by mouth  at bedtime.  12/03/17  Yes [provider]  sucralfate (CARAFATE) 1 g tablet Take 1 g by mouth 3 (three) times daily before meals.  12/21/17  Yes [provider]  tamsulosin (FLOMAX) 0.4 MG CAPS capsule Take 0.4 mg by mouth at bedtime.  01/03/18  Yes [provider]  tiZANidine (ZANAFLEX) 4 MG tablet Take 4 mg by mouth every 6 (six) hours as needed for muscle spasms.  12/20/17  Yes [provider]  vitamin E 400 UNIT capsule Take 400 Units by mouth daily.   Yes [provider]  warfarin (COUMADIN) 2.5 MG tablet TAKE 1 TO 1 AND 1/2 TABLETS DAILY AS DIRECTED BY COUMADIN CLINIC Patient taking differently: Take 2.5-3.75 mg by mouth See admin instructions. 2.5mg  on Sunday and Thursday. 3.75mg  on all other days. 11/16/17  Yes Minus Breeding, MD  zolpidem (AMBIEN CR) 12.5 MG CR tablet Take 12.5 mg by mouth at bedtime.   Yes [provider]   Allergies  Allergen Reactions  . Sulfacetamide Sodium Swelling    throat swelling  . Latex Itching    Severe contact dermatitis  . Adhesive [Tape] Other (See Comments)    Tears skin-- "No tape of any kind, they all tear skin right off"    FAMILY HISTORY:  family history includes Alzheimer's disease in his sister; Breast cancer in his mother; Cancer in his mother; Colon cancer (age of onset: 54) in his brother; Emphysema in his father; Heart attack in his brother; Prostate cancer in his  brother; Uterine cancer in his mother. SOCIAL HISTORY:  reports that he has never smoked. He has never used smokeless tobacco. He reports that he does not drink alcohol or use drugs.  REVIEW OF SYSTEMS:   Positive for 15 pound weight loss. Other positives as above    Constitutional: Negative for fever, chills, malaise/fatigue and diaphoresis.  HENT: Negative for hearing loss, ear pain, nosebleeds, congestion, sore throat, neck pain, tinnitus and ear discharge.   Eyes: Negative for blurred vision, double vision,  photophobia, pain, discharge and redness.  Respiratory: Negative for  hemoptysis,wheezing and stridor.   Cardiovascular: Negative for chest pain, palpitations, orthopnea, claudication, leg swelling and PND.  Gastrointestinal: Negative for heartburn, nausea, vomiting, abdominal pain, diarrhea, constipation, blood in stool and melena.  Genitourinary: Negative for dysuria, urgency, frequency, hematuria and flank pain.  Musculoskeletal: Negative for myalgias, back pain, joint pain and falls.  Skin: Negative for itching and rash.  Neurological: Negative for dizziness, tingling, tremors, sensory change, speech change, focal weakness, seizures, loss of consciousness, weakness and headaches.  Endo/Heme/Allergies: Negative for environmental allergies and polydipsia. Does not bruise/bleed easily.  SUBJECTIVE:   VITAL SIGNS: Temp:  [97.7 F (36.5 C)-98.6 F (37 C)] 97.7 F (36.5 C) (12/05 0517) Pulse Rate:  [78-113] 100 (12/05 0517) Resp:  [16-26] 16 (12/05 0517) BP: (105-126)/(64-88) 120/88 (12/05 0517) SpO2:  [91 %-100 %] 96 % (12/05 0827) Weight:  [80.3 kg] 80.3 kg (12/05 0106)  PHYSICAL EXAMINATION: Gen. Pleasant, obese, in no distress, normal affect ENT - no lesions, no post nasal drip, class 2-3 airway Neck: No JVD, no thyromegaly, no carotid bruits Lungs: no use of accessory muscles, no dullness to percussion, biasal 1/2 rales no rhonchi  Cardiovascular: Rhythm regular, heart sounds  normal, no murmurs or gallops, no peripheral edema Abdomen: soft and non-tender, no hepatosplenomegaly, BS normal. Musculoskeletal: No deformities, no cyanosis or clubbing Neuro:  alert, non focal, no tremors   Recent Labs  Lab 01/10/18 1657 01/12/18 1508 01/13/18 0351  NA 134* 135 135  K 4.2 4.6 4.4  CL 99 100 101  CO2 24 26 21*  BUN 22 26* 27*  CREATININE 1.40 1.46* 1.40*  GLUCOSE 191* 140* 204*   Recent Labs  Lab 01/10/18 1657 01/12/18 1508 01/13/18 0351  HGB 13.1 11.2* 11.4*  HCT 39.8  35.6* 35.3*  WBC 13.8* 9.7 7.8  PLT 340.0 309 280    ASSESSMENT / PLAN:  Idiopathic pulmonary fibrosis-detailed history was taken and there is no history of use of drugs that could cause pulmonary toxicity.  There is no evidence of collagen vascular disease on history or exam.  To my review, CT chest shows honeycombing [even though it is not a high-resolution CT] which would be very indicative of UIP with bibasal predominance especially peripheral and traction bronchiectasis.  In this 77 year old without an alternative diagnosis, this would be diagnostic of IPF.  On review of his serial abdominal scans that he has had over the years, these seem to date back to 2012.  Although symptomatically he has been worse only for the past 5 to 6 weeks. Reasonable to resume that he has an IPF flare, he certainly has disease progression.  Recommend- Complete screening with ANA, CCP Okay to use current dose of steroids for about 24 hours especially to see if we can provide him any relief for his cough and then decrease rapidly Full PFTs as outpatient to establish new baseline Assessment for need for oxygen on exertion, he will need pulmonary rehab  as outpatient would consider home physical therapy if he qualifies Briefly discussed anti-fibrotic medications with him and this discussion can be continued as outpatient.  He is scheduled for radiation therapy for prostate cancer in January.  IPF would not be a contraindication to continue this  Finding of small subsegmental PE-of doubtful clinical significance, INR is therapeutic and can continue Coumadin  Kara Mead MD. FCCP. [facility] Pager [phone] If no response call [phone]   01/13/2018, 1:05 PM

## 2018-01-13 NOTE — Progress Notes (Signed)
PROGRESS NOTE    [name]  UMP:[id number] DOB: 09-09-1940 DOA: 01/12/2018 PCP: Jinny Sanders, MD   Brief Narrative:  HPI per Dr. Sander Radon on 01/12/18 [name] is a 77 y.o. male with medical history significant of hypertension, type 2 diabetes mellitus, remote history of DVT/PE, childhood asthma, recently diagnosed with prostate cancer.  Patient presents with 5 weeks of worsening dyspnea, which has rapidly progressed over the last 7 days, to a severe intensity, to the point where he is dyspneic at rest and has difficulty speaking, it has been associated with dry cough, occasional wheezing and chills, no increased sputum production, no improving or worsening factors.  No PND, orthopnea lower extremity edema.  His symptoms started with a flulike illness that rapidly progressed into dyspnea.  Before his symptoms started he was functional and independent, no limitations in physical exertion.  Occasional cough and dyspnea self resolved.  Never smoked cigarettes.  He has been treated as an outpatient with multiple antibiotics, and inhalers, with no improvement of his symptoms.  ED Course: Patient was deconditioned and ill looking appearing, dyspneic, he received oxygen, per nasal cannula.  For further admission for evaluation.  **Pulmonary and hematology were consulted for further evaluation recommendations.  Patient will be continued on IV Solu-Medrol along with nebulized breathing treatments.  Assessment & Plan:   Active Problems:   Dyspnea   Atrial fibrillation (HCC)   Pulmonary fibrosis (HCC)   Pulmonary embolus (HCC)  Pulmonary fibrosis with suspected acute exacerbation. -Patient will be admitted to the medical ward, he will be placed on high-dose systemic steroids with methylprednisolone 60 mg q6h,  -C/w Aggressive bronchodilator therapy, oximetry monitoring and supplemental oxygen per nasal cannula to target O2 saturation greater 92%.   -Likely patient also has  pulmonary hypertension, and an echocardiography was also done.  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 Eartha Inch recommending using current dose of steroids for the next 24 hours especially see if we can provide him any relief for his cough and then decreasing rapidly -Dr. Elsworth Soho also recommends full PFTs as an outpatient to establish new baseline and assess for oxygen on exertion prior to discharge.  Dr. I will feel the patient will need pulmonary rehab as an outpatient and consider home physical therapy if he qualifies -Appreciate pulmonary following and further evaluation recommendations.  Remote history of DVT and pulmonary embolism.   -Chest CT with a small segmental to subsegmental pulmonary embolism, this is very unlikely to explain patient's symptoms,  -Will continue anticoagulation with warfarin -Ultrasonography of the lower extremities showed no DVT  -ECHOCardiogram showed EF of 55-60% with severely dilated LA and Right Ventricle Systolic Fxn being Normal -Hematology consulted for new PE on Warfarin and feels taht the LUL PE is very small and unlikely responsible for acute worsening dyspne and cough -? Unclear if patient failed Lovenox -Dr. Maylon Peppers recommending Therapeutic Lovenox but patient is hesitant about this life long injection. -Alternative Option includes Eliquis and patient is agreeble to Eliquis and starting after INR is <2  Chronic Atrial Fibrillation -On Coumadin -Oncology Recommending switching to Eliquis -Will need to discuss with Cardiology -Not on Any Rate or Rhythm Control currently -Pharmacy to Manage Coumadin Dosing for now  Dyslipidemia. -Continue Atorvastatin 80 mg po Daily and Omega-3 Acid Etyhl Esters   History of asthma -Continue inhaled corticosteroids 60 mg q6h for today -C/w DuoNeb TID and q2hprn for wheezing  and SOB  GERD -Continue Antiacid  therapy with pantoprazole and sucralfate.   Recently diagnosed prostate cancer.   -No signs of urinary retention,  -continue Flomax and follow-up as an outpatient.  DVT prophylaxis:  Code Status: FULL CODE Family Communication: Discussed with wife at bedside Disposition Plan:   Consultants:   Hematology  Pulmonary   Procedures:  ECHOCARDIOGRAM ------------------------------------------------------------------- Study Conclusions  - Left ventricle: The cavity size was normal. There was mild   concentric hypertrophy. Systolic function was normal. The   estimated ejection fraction was in the range of 55% to 60%. Wall   motion was normal; there were no regional wall motion   abnormalities. The study was not technically sufficient to allow   evaluation of LV diastolic dysfunction due to atrial   fibrillation. - Aortic valve: Transvalvular velocity was minimally increased.   There was no stenosis. There was mild regurgitation. - Mitral valve: There was mild regurgitation. - Left atrium: The atrium was severely dilated. - Right ventricle: Systolic function was normal. - Right atrium: The atrium was moderately dilated. - Tricuspid valve: There was mild regurgitation. - Pulmonic valve: There was no regurgitation. - Pulmonary arteries: Systolic pressure was mildly increased. PA   peak pressure: 38 mm Hg (S). - Inferior vena cava: The vessel was normal in size. - Pericardium, extracardiac: There was no pericardial effusion.    Antimicrobials:  Anti-infectives (From admission, onward)   None     Subjective: Seen and examined at bedside and feels short of breath still but not as bad yesterday.  No chest pain, lightheadedness or dizziness.  States he gets very winded on exertion and has been winded for last few days.  Also complaining of cough.  No lightheadedness or dizziness.  No other concerns or complaints at this time.  Objective: Vitals:   01/13/18 0517 01/13/18 0827  01/13/18 1445 01/13/18 1619  BP: 120/88  120/71   Pulse: 100  88   Resp: 16  18   Temp: 97.7 F (36.5 C)  97.8 F (36.6 C)   TempSrc: Oral  Oral   SpO2: 96% 96% 96% 93%  Weight:      Height:        Intake/Output Summary (Last 24 hours) at 01/13/2018 1833 Last data filed at 01/13/2018 0510 Gross per 24 hour  Intake 240 ml  Output 501 ml  Net -261 ml   Filed Weights   01/13/18 0106  Weight: 80.3 kg   Examination: Physical Exam:  Constitutional: WN/WD thin elderly Caucasian male in NAD and appears calm but a little uncomfortable Eyes: Lids and conjunctivae normal, sclerae anicteric  ENMT: External Ears, Nose appear normal. Grossly normal hearing.   Neck: Appears normal, supple, no cervical masses, normal ROM, no appreciable thyromegaly; no JVD Respiratory: Diminished to auscultation bilaterally with some coarse breath sound; No appreciable no wheezing, rales, rhonchi . Normal respiratory effort and patient is not tachypenic. No accessory muscle use and is not wearing supplemental O2 via [place] Cardiovascular: RRR, no murmurs / rubs / gallops. S1 and S2 auscultated. No extremity edema.  Abdomen: Soft, non-tender, non-distended. No masses palpated. No appreciable hepatosplenomegaly. Bowel sounds positive x4.  GU: Deferred. Musculoskeletal: No clubbing / cyanosis of digits/nails. Normal strength and muscle tone.  Skin: No rashes, lesions, ulcers on a limited skin evaluation. No induration; Warm and dry.  Neurologic: CN 2-12 grossly intact with no focal deficits. Romberg sign and cerebellar reflexes not assessed.  Psychiatric: Normal judgment and insight. Alert and oriented x  3. Slightly anxious mood and appropriate affect.   Data Reviewed: I have personally reviewed following labs and imaging studies  CBC: Recent Labs  Lab 01/10/18 1657 01/12/18 1508 01/13/18 0351  WBC 13.8* 9.7 7.8  NEUTROABS 10.7* 6.4  --   HGB 13.1 11.2* 11.4*  HCT 39.8 35.6* 35.3*  MCV 90.9 93.2 91.5  PLT  340.0 309 601   Basic Metabolic Panel: Recent Labs  Lab 01/10/18 1657 01/12/18 1508 01/13/18 0351  NA 134* 135 135  K 4.2 4.6 4.4  CL 99 100 101  CO2 24 26 21*  GLUCOSE 191* 140* 204*  BUN 22 26* 27*  CREATININE 1.40 1.46* 1.40*  CALCIUM 8.7 8.3* 8.2*   GFR: Estimated Creatinine Clearance: 49.3 mL/min (A) (by C-G formula based on SCr of 1.4 mg/dL (H)). Liver Function Tests: Recent Labs  Lab 01/10/18 1657 01/12/18 1508  AST 20 24  ALT 13 14  ALKPHOS 93 74  BILITOT 0.4 0.5  PROT 7.0 6.7  ALBUMIN 3.6 3.0*   No results for input(s): LIPASE, AMYLASE in the last 168 hours. No results for input(s): AMMONIA in the last 168 hours. Coagulation Profile: Recent Labs  Lab 01/12/18 1508 01/13/18 0351  INR 2.75 2.56   Cardiac Enzymes: No results for input(s): CKTOTAL, CKMB, CKMBINDEX, TROPONINI in the last 168 hours. BNP (last 3 results) Recent Labs    01/10/18 1657  PROBNP 123.0*   HbA1C: No results for input(s): HGBA1C in the last 72 hours. CBG: No results for input(s): GLUCAP in the last 168 hours. Lipid Profile: No results for input(s): CHOL, HDL, LDLCALC, TRIG, CHOLHDL, LDLDIRECT in the last 72 hours. Thyroid Function Tests: No results for input(s): TSH, T4TOTAL, FREET4, T3FREE, THYROIDAB in the last 72 hours. Anemia Panel: No results for input(s): VITAMINB12, FOLATE, FERRITIN, TIBC, IRON, RETICCTPCT in the last 72 hours. Sepsis Labs: No results for input(s): PROCALCITON, LATICACIDVEN in the last 168 hours.  No results found for this or any previous visit (from the past 240 hour(s)).   Radiology Studies: Ct Angio Chest W/cm &/or Wo Cm  Result Date: 01/12/2018 CLINICAL DATA:  Cough with dyspnea and wheezing. New diagnosis of prostate cancer. Abdominal pain. EXAM: CT ANGIOGRAPHY CHEST CT ABDOMEN AND PELVIS WITH CONTRAST TECHNIQUE: Multidetector CT imaging of the chest was performed using the standard protocol during bolus administration of intravenous contrast.  Multiplanar CT image reconstructions and MIPs were obtained to evaluate the vascular anatomy. Multidetector CT imaging of the abdomen and pelvis was performed using the standard protocol during bolus administration of intravenous contrast. CONTRAST:  173mL ISOVUE-370 IOPAMIDOL (ISOVUE-370) INJECTION 76% COMPARISON:  Pelvic CT 10/25/2017. FINDINGS: CTA CHEST FINDINGS Cardiovascular: Heart size upper normal. Coronary artery calcification is evident. Atherosclerotic calcification is noted in the wall of the thoracic aorta. Right main pulmonary artery measures 3.3 cm diameter. Left main pulmonary artery measures up to 2.7 cm diameter. Pulmonary arteries are well opacified. No right-sided filling defects to suggest acute pulmonary embolus. Patient does have a single small focus of segmental to subsegmental pulmonary embolus in the left upper lobe (image 142/series 11). Potential 3-4 mm embolic focus identified in distal pulmonary artery to the lingula (image 203/series 11). No other left-sided pulmonary embolic disease. Mediastinum/Nodes: Upper normal to mild mediastinal lymphadenopathy evident. 11 mm short axis right paratracheal node is partially obscured by beam hardening artifact from contrast in the SVC. The the the 14 mm AP window lymph node is visible on 45/4. Small lymph nodes evident in each hilum with 12  mm short axis inferior right hilar lymph node evident. Probable irregular non circumferential wall thickening in the distal esophagus (57/4) There is no axillary lymphadenopathy. Lungs/Pleura: The central tracheobronchial airways are patent. Basilar predominant interstitial opacity noted with subpleural reticulation, alveolar ground-glass opacity, and diffuse bronchiectasis. Areas of peripheral patchy ground-glass attenuation are seen in the upper lungs bilaterally. This ground-glass attenuation is associated with bronchiectasis. No pleural effusion. Musculoskeletal: No worrisome lytic or sclerotic osseous  abnormality. Patient is status post left shoulder replacement. Review of the MIP images confirms the above findings. CT ABDOMEN and PELVIS FINDINGS Hepatobiliary: No focal abnormality within the liver parenchyma. Gallbladder is distended. No intrahepatic or extrahepatic biliary dilation. Pancreas: No focal mass lesion. No dilatation of the main duct. No intraparenchymal cyst. No peripancreatic edema. Spleen: Wedge-shaped area of decreased perfusion in the the peripheral spleen suggests infarct, age indeterminate. Adrenals/Urinary Tract: No adrenal nodule or mass. 4 cm cyst identified lower pole right kidney. 8 mm well-defined low-density lesion in the lower interpolar region of the left kidney is too small to characterize but likely a cyst. No evidence for hydroureter. The urinary bladder appears normal for the degree of distention. Stomach/Bowel: Status post gastric bypass. Duodenum is normally positioned as is the ligament of Treitz. No small bowel wall thickening. No small bowel dilatation. The terminal ileum is normal. The appendix is not visualized, but there is no edema or inflammation in the region of the cecum. Diverticular changes are noted in the left colon without evidence of diverticulitis. Vascular/Lymphatic: There is abdominal aortic atherosclerosis without aneurysm. There is no gastrohepatic or hepatoduodenal ligament lymphadenopathy. No intraperitoneal or retroperitoneal lymphadenopathy. No pelvic sidewall lymphadenopathy. Reproductive: Prostate gland unremarkable. Penile prosthesis evident with irregular contour of the reservoir. Reservoir rupture not excluded. Other: No intraperitoneal free fluid. Musculoskeletal: Bilateral SI joint effusion evident. Lower lumbar fusion hardware associated. Patient is status post vertebral augmentation at L1. Review of the MIP images confirms the above findings. IMPRESSION: 1. Tiny segmental to subsegmental pulmonary embolus in the left upper lobe with potential  miniscule focus of embolism in a distal pulmonary artery to the lingula. 2. Advanced interstitial, basilar predominant fibrotic lung disease. Scattered areas of ground-glass attenuation are associated with bronchiectasis. 3. Enlargement of the main pulmonary arteries suggests pulmonary arterial hypertension. 4. Asymmetric irregular wall thickening noted distal esophagus. Distal esophageal neoplasm a concern. 5. Mild mediastinal and hilar lymphadenopathy, possibly reactive. In the setting of neoplasm, metastatic disease would be a concern. 6. 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 7. Status post gastric bypass. These results will be called to the ordering clinician or representative by the Radiologist Assistant, and communication documented in the PACS or zVision Dashboard. Electronically Signed   By: Misty Stanley M.D.   On: 01/12/2018 12:25   Ct Abdomen Pelvis W Contrast  Result Date: 01/12/2018 CLINICAL DATA:  Cough with dyspnea and wheezing. New diagnosis of prostate cancer. Abdominal pain. EXAM: CT ANGIOGRAPHY CHEST CT ABDOMEN AND PELVIS WITH CONTRAST TECHNIQUE: Multidetector CT imaging of the chest was performed using the standard protocol during bolus administration of intravenous contrast. Multiplanar CT image reconstructions and MIPs were obtained to evaluate the vascular anatomy. Multidetector CT imaging of the abdomen and pelvis was performed using the standard protocol during bolus administration of intravenous contrast. CONTRAST:  13mL ISOVUE-370 IOPAMIDOL (ISOVUE-370) INJECTION 76% COMPARISON:  Pelvic CT 10/25/2017. FINDINGS: CTA CHEST FINDINGS Cardiovascular: Heart size upper normal. Coronary artery calcification is evident. Atherosclerotic calcification is noted in the  wall of the thoracic aorta. Right main pulmonary artery measures 3.3 cm diameter. Left main pulmonary artery measures up to 2.7 cm diameter. Pulmonary arteries are well  opacified. No right-sided filling defects to suggest acute pulmonary embolus. Patient does have a single small focus of segmental to subsegmental pulmonary embolus in the left upper lobe (image 142/series 11). Potential 3-4 mm embolic focus identified in distal pulmonary artery to the lingula (image 203/series 11). No other left-sided pulmonary embolic disease. Mediastinum/Nodes: Upper normal to mild mediastinal lymphadenopathy evident. 11 mm short axis right paratracheal node is partially obscured by beam hardening artifact from contrast in the SVC. The the the 14 mm AP window lymph node is visible on 45/4. Small lymph nodes evident in each hilum with 12 mm short axis inferior right hilar lymph node evident. Probable irregular non circumferential wall thickening in the distal esophagus (57/4) There is no axillary lymphadenopathy. Lungs/Pleura: The central tracheobronchial airways are patent. Basilar predominant interstitial opacity noted with subpleural reticulation, alveolar ground-glass opacity, and diffuse bronchiectasis. Areas of peripheral patchy ground-glass attenuation are seen in the upper lungs bilaterally. This ground-glass attenuation is associated with bronchiectasis. No pleural effusion. Musculoskeletal: No worrisome lytic or sclerotic osseous abnormality. Patient is status post left shoulder replacement. Review of the MIP images confirms the above findings. CT ABDOMEN and PELVIS FINDINGS Hepatobiliary: No focal abnormality within the liver parenchyma. Gallbladder is distended. No intrahepatic or extrahepatic biliary dilation. Pancreas: No focal mass lesion. No dilatation of the main duct. No intraparenchymal cyst. No peripancreatic edema. Spleen: Wedge-shaped area of decreased perfusion in the the peripheral spleen suggests infarct, age indeterminate. Adrenals/Urinary Tract: No adrenal nodule or mass. 4 cm cyst identified lower pole right kidney. 8 mm well-defined low-density lesion in the lower  interpolar region of the left kidney is too small to characterize but likely a cyst. No evidence for hydroureter. The urinary bladder appears normal for the degree of distention. Stomach/Bowel: Status post gastric bypass. Duodenum is normally positioned as is the ligament of Treitz. No small bowel wall thickening. No small bowel dilatation. The terminal ileum is normal. The appendix is not visualized, but there is no edema or inflammation in the region of the cecum. Diverticular changes are noted in the left colon without evidence of diverticulitis. Vascular/Lymphatic: There is abdominal aortic atherosclerosis without aneurysm. There is no gastrohepatic or hepatoduodenal ligament lymphadenopathy. No intraperitoneal or retroperitoneal lymphadenopathy. No pelvic sidewall lymphadenopathy. Reproductive: Prostate gland unremarkable. Penile prosthesis evident with irregular contour of the reservoir. Reservoir rupture not excluded. Other: No intraperitoneal free fluid. Musculoskeletal: Bilateral SI joint effusion evident. Lower lumbar fusion hardware associated. Patient is status post vertebral augmentation at L1. Review of the MIP images confirms the above findings. IMPRESSION: 1. Tiny segmental to subsegmental pulmonary embolus in the left upper lobe with potential miniscule focus of embolism in a distal pulmonary artery to the lingula. 2. Advanced interstitial, basilar predominant fibrotic lung disease. Scattered areas of ground-glass attenuation are associated with bronchiectasis. 3. Enlargement of the main pulmonary arteries suggests pulmonary arterial hypertension. 4. Asymmetric irregular wall thickening noted distal esophagus. Distal esophageal neoplasm a concern. 5. Mild mediastinal and hilar lymphadenopathy, possibly reactive. In the setting of neoplasm, metastatic disease would be a concern. 6. 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 7. Status post gastric  bypass. These results will be called to the ordering clinician or representative by the Radiologist Assistant, and communication documented in the PACS or zVision Dashboard. Electronically Signed   By:  Misty Stanley M.D.   On: 01/12/2018 12:25   Vas [place] Lower Extremity Venous (dvt) (only [facility])  Result Date: 01/12/2018  Lower Venous Study Indications: Pulmonary embolism.  Performing Technologist: Oliver Hum RVT  Examination Guidelines: A complete evaluation includes B-mode imaging, spectral Doppler, color Doppler, and power Doppler as needed of all accessible portions of each vessel. Bilateral testing is considered an integral part of a complete examination. Limited examinations for reoccurring indications may be performed as noted. The reflux portion of the exam is performed with the patient in reverse Trendelenburg.  Right Venous Findings: +---------+---------------+---------+-----------+----------+-------+          CompressibilityPhasicitySpontaneityPropertiesSummary +---------+---------------+---------+-----------+----------+-------+ CFV      Full           Yes      Yes                          +---------+---------------+---------+-----------+----------+-------+ SFJ      Full                                                 +---------+---------------+---------+-----------+----------+-------+ FV Prox  Full                                                 +---------+---------------+---------+-----------+----------+-------+ FV Mid   Full                                                 +---------+---------------+---------+-----------+----------+-------+ FV DistalFull                                                 +---------+---------------+---------+-----------+----------+-------+ PFV      Full                                                 +---------+---------------+---------+-----------+----------+-------+ POP      Full           Yes      Yes                           +---------+---------------+---------+-----------+----------+-------+ PTV      Full                                                 +---------+---------------+---------+-----------+----------+-------+ PERO     Full                                                 +---------+---------------+---------+-----------+----------+-------+  Left Venous Findings: +---------+---------------+---------+-----------+----------+-------+  CompressibilityPhasicitySpontaneityPropertiesSummary +---------+---------------+---------+-----------+----------+-------+ CFV      Full           Yes      Yes                          +---------+---------------+---------+-----------+----------+-------+ SFJ      Full                                                 +---------+---------------+---------+-----------+----------+-------+ FV Prox  Full                                                 +---------+---------------+---------+-----------+----------+-------+ FV Mid   Full                                                 +---------+---------------+---------+-----------+----------+-------+ FV DistalFull                                                 +---------+---------------+---------+-----------+----------+-------+ PFV      Full                                                 +---------+---------------+---------+-----------+----------+-------+ POP      Full           Yes      Yes                          +---------+---------------+---------+-----------+----------+-------+ PTV      Full                                                 +---------+---------------+---------+-----------+----------+-------+ PERO     Full                                                 +---------+---------------+---------+-----------+----------+-------+    Summary: Right: There is no evidence of deep vein thrombosis in the lower extremity. No cystic structure found  in the popliteal fossa. Left: There is no evidence of deep vein thrombosis in the lower extremity. No cystic structure found in the popliteal fossa.  *See table(s) above for measurements and observations. Electronically signed by Quay Burow MD on 01/12/2018 at 4:45:11 PM.    Final    Scheduled Meds: . acarbose  50 mg Oral TID WC  . atorvastatin  80 mg Oral QHS  . famotidine  20 mg Oral Daily  . FLUoxetine  60 mg Oral QHS  . fluticasone furoate-vilanterol  1 puff Inhalation Daily  .  gabapentin  300 mg Oral BID  . ipratropium-albuterol  3 mL Nebulization TID  . methylPREDNISolone (SOLU-MEDROL) injection  60 mg Intravenous Q6H  . omega-3 acid ethyl esters  1 g Oral Daily  . pantoprazole  40 mg Oral Daily  . sucralfate  1 g Oral TID AC  . tamsulosin  0.4 mg Oral QHS  . vitamin E  400 Units Oral Daily  . Warfarin - Pharmacist Dosing Inpatient   Does not apply q1800   Continuous Infusions:   LOS: 1 day   Kerney Elbe, DO [facility] [facility] PAG[facility] is on AMION  If 7PM-7AM, please contact night-coverage [email or web address] Password [facility] 01/13/2018, 6:33 PM

## 2018-01-14 ENCOUNTER — Inpatient Hospital Stay (HOSPITAL_COMMUNITY): Payer: Medicare Other

## 2018-01-14 DIAGNOSIS — J8410 Pulmonary fibrosis, unspecified: Secondary | ICD-10-CM

## 2018-01-14 DIAGNOSIS — J9601 Acute respiratory failure with hypoxia: Secondary | ICD-10-CM

## 2018-01-14 LAB — CBC WITH DIFFERENTIAL/PLATELET
ABS IMMATURE GRANULOCYTES: 0.13 10*3/uL — AB (ref 0.00–0.07)
Basophils Absolute: 0 10*3/uL (ref 0.0–0.1)
Basophils Relative: 0 %
Eosinophils Absolute: 0 10*3/uL (ref 0.0–0.5)
Eosinophils Relative: 0 %
HCT: 35 % — ABNORMAL LOW (ref 39.0–52.0)
Hemoglobin: 11.3 g/dL — ABNORMAL LOW (ref 13.0–17.0)
Immature Granulocytes: 1 %
Lymphocytes Relative: 3 %
Lymphs Abs: 0.6 10*3/uL — ABNORMAL LOW (ref 0.7–4.0)
MCH: 30.2 pg (ref 26.0–34.0)
MCHC: 32.3 g/dL (ref 30.0–36.0)
MCV: 93.6 fL (ref 80.0–100.0)
Monocytes Absolute: 1 10*3/uL (ref 0.1–1.0)
Monocytes Relative: 6 %
Neutro Abs: 15.9 10*3/uL — ABNORMAL HIGH (ref 1.7–7.7)
Neutrophils Relative %: 90 %
Platelets: 277 10*3/uL (ref 150–400)
RBC: 3.74 MIL/uL — ABNORMAL LOW (ref 4.22–5.81)
RDW: 13.1 % (ref 11.5–15.5)
WBC: 17.6 10*3/uL — ABNORMAL HIGH (ref 4.0–10.5)
nRBC: 0 % (ref 0.0–0.2)

## 2018-01-14 LAB — COMPREHENSIVE METABOLIC PANEL
ALBUMIN: 2.9 g/dL — AB (ref 3.5–5.0)
ALT: 17 U/L (ref 0–44)
AST: 25 U/L (ref 15–41)
Alkaline Phosphatase: 79 U/L (ref 38–126)
Anion gap: 10 (ref 5–15)
BUN: 33 mg/dL — ABNORMAL HIGH (ref 8–23)
CALCIUM: 8.2 mg/dL — AB (ref 8.9–10.3)
CO2: 22 mmol/L (ref 22–32)
Chloride: 100 mmol/L (ref 98–111)
Creatinine, Ser: 1.43 mg/dL — ABNORMAL HIGH (ref 0.61–1.24)
GFR calc Af Amer: 55 mL/min — ABNORMAL LOW (ref >60)
GFR calc non Af Amer: 47 mL/min — ABNORMAL LOW (ref >60)
Glucose, Bld: 307 mg/dL — ABNORMAL HIGH (ref 70–99)
Potassium: 4.2 mmol/L (ref 3.5–5.1)
Sodium: 132 mmol/L — ABNORMAL LOW (ref 135–145)
Total Bilirubin: 0.4 mg/dL (ref 0.3–1.2)
Total Protein: 6.4 g/dL — ABNORMAL LOW (ref 6.5–8.1)

## 2018-01-14 LAB — CYCLIC CITRUL PEPTIDE ANTIBODY, IGG/IGA: CCP Antibodies IgG/IgA: 8 units (ref 0–19)

## 2018-01-14 LAB — GLUCOSE, CAPILLARY
GLUCOSE-CAPILLARY: 195 mg/dL — AB (ref 70–99)
Glucose-Capillary: 262 mg/dL — ABNORMAL HIGH (ref 70–99)
Glucose-Capillary: 278 mg/dL — ABNORMAL HIGH (ref 70–99)

## 2018-01-14 LAB — FANA STAINING PATTERNS: Homogeneous Pattern: 1:160 {titer} — ABNORMAL HIGH

## 2018-01-14 LAB — PROTIME-INR
INR: 2.86
Prothrombin Time: 29.6 seconds — ABNORMAL HIGH (ref 11.4–15.2)

## 2018-01-14 LAB — ANTINUCLEAR ANTIBODIES, IFA: ANA Ab, IFA: POSITIVE — AB

## 2018-01-14 LAB — PHOSPHORUS: Phosphorus: 3.2 mg/dL (ref 2.5–4.6)

## 2018-01-14 LAB — ANA W/REFLEX IF POSITIVE: ANA: NEGATIVE

## 2018-01-14 LAB — SEDIMENTATION RATE: Sed Rate: 22 mm/hr — ABNORMAL HIGH (ref 0–16)

## 2018-01-14 LAB — MAGNESIUM: Magnesium: 2.2 mg/dL (ref 1.7–2.4)

## 2018-01-14 MED ORDER — WARFARIN SODIUM 3 MG PO TABS
3.0000 mg | ORAL_TABLET | Freq: Once | ORAL | Status: DC
  Filled 2018-01-14: qty 1

## 2018-01-14 MED ORDER — INSULIN ASPART 100 UNIT/ML ~~LOC~~ SOLN: 0.0000 [IU] | Freq: Every day | SUBCUTANEOUS | Status: DC

## 2018-01-14 MED ORDER — METHYLPREDNISOLONE SODIUM SUCC 125 MG IJ SOLR
60.0000 mg | Freq: Every day | INTRAMUSCULAR | Status: DC
  Administered 2018-01-15: 60 mg via INTRAVENOUS
  Filled 2018-01-14: qty 2

## 2018-01-14 MED ORDER — HYDROCOD POLST-CPM POLST ER 10-8 MG/5ML PO SUER
1.5000 mL | Freq: Two times a day (BID) | ORAL | Status: DC | PRN
  Administered 2018-01-14: 1.5 mL via ORAL
  Filled 2018-01-14: qty 5

## 2018-01-14 MED ORDER — BENZONATATE 200 MG PO CAPS: 200.0000 mg | ORAL_CAPSULE | Freq: Three times a day (TID) | ORAL | 0 refills | Status: AC | PRN

## 2018-01-14 MED ORDER — INSULIN ASPART 100 UNIT/ML ~~LOC~~ SOLN
0.0000 [IU] | Freq: Three times a day (TID) | SUBCUTANEOUS | Status: DC
  Administered 2018-01-14 (×2): 5 [IU] via SUBCUTANEOUS
  Administered 2018-01-15 (×2): 2 [IU] via SUBCUTANEOUS

## 2018-01-14 NOTE — Progress Notes (Signed)
PT NOTE  SATURATION QUALIFICATIONS: (This note is used to comply with regulatory documentation for home oxygen)  Patient Saturations on Room Air at Rest = 92%  Patient Saturations on Room Air while Ambulating = 85%  Patient Saturations on 2 Liters of oxygen while Ambulating = 91%  Please briefly explain why patient needs home oxygen:  Pt very SOB and panting for breath ambulating on Room Air with HR elevated to 132.

## 2018-01-14 NOTE — Progress Notes (Addendum)
ANTICOAGULATION CONSULT NOTE - Follow Up Consult  Pharmacy Consult for apixaban transition from warfarin Indication: hx pulmonary embolus and DVT, CAF; new PE per Chest CTA on 12/4  Allergies  Allergen Reactions  . Sulfacetamide Sodium Swelling    throat swelling  . Latex Itching    Severe contact dermatitis  . Adhesive [Tape] Other (See Comments)    Tears skin-- "No tape of any kind, they all tear skin right off"    Patient Measurements: Height: 6' (182.9 cm) Weight: 177 lb 0.5 oz (80.3 kg) IBW/kg (Calculated) : 77.6 Heparin Dosing Weight:   Vital Signs: Temp: 97.5 F (36.4 C) (12/06 0614) Temp Source: Oral (12/06 0614) BP: 106/88 (12/06 2440) Pulse Rate: 69 (12/06 0614)  Labs: Recent Labs    01/12/18 1508 01/13/18 0351 01/14/18 0440  HGB 11.2* 11.4* 11.3*  HCT 35.6* 35.3* 35.0*  PLT 309 280 277  LABPROT 28.7* 27.1* 29.6*  INR 2.75 2.56 2.86  CREATININE 1.46* 1.40* 1.43*    Estimated Creatinine Clearance: 48.2 mL/min (A) (by C-G formula based on SCr of 1.43 mg/dL (H)).   Medications:  PTA warfarin regimen: 3.75 mg daily except 2.5 mg on Sun & Thurs  Assessment: Patient's a 77 y.o M with hx recurrent prostate cancer with suspected mets to esophagus and h/o CAF and DVT/PE on warfarin PTA, presented to the ED on 12/5 with c/o SOB.  CTA shows small PE despite therapeutic INR of 2.75 today (goal 2.5 - 3.5 per clinic records). LE doppler on 12/4 neg for DVT.  Warfarin resumed on admission. Apixaban ordered 12/6. Oncology wants INR < 2 before starting.   Today, 01/14/2018: - INR is therapeutic at 2.86 - cbc is stable - no bleeding documented  Goal of Therapy:  INR 2.5-3.5 Monitor platelets by anticoagulation protocol: Yes   Plan:  - Will discontinue warfarin order and consult.  - Will f/u daily INR and initiate Eliquis once INR < 2.  Ulice Dash D 01/14/2018,1:41 PM

## 2018-01-14 NOTE — Progress Notes (Signed)
ANTICOAGULATION CONSULT NOTE - Follow Up Consult  Pharmacy Consult for warfarin Indication: hx pulmonary embolus and DVT, CAF; new PE per Chest CTA on 12/4  Allergies  Allergen Reactions  . Sulfacetamide Sodium Swelling    throat swelling  . Latex Itching    Severe contact dermatitis  . Adhesive [Tape] Other (See Comments)    Tears skin-- "No tape of any kind, they all tear skin right off"    Patient Measurements: Height: 6' (182.9 cm) Weight: 177 lb 0.5 oz (80.3 kg) IBW/kg (Calculated) : 77.6 Heparin Dosing Weight:   Vital Signs: Temp: 97.5 F (36.4 C) (12/06 0614) Temp Source: Oral (12/06 0614) BP: 106/88 (12/06 5053) Pulse Rate: 69 (12/06 0614)  Labs: Recent Labs    01/12/18 1508 01/13/18 0351 01/14/18 0440  HGB 11.2* 11.4* 11.3*  HCT 35.6* 35.3* 35.0*  PLT 309 280 277  LABPROT 28.7* 27.1* 29.6*  INR 2.75 2.56 2.86  CREATININE 1.46* 1.40* 1.43*    Estimated Creatinine Clearance: 48.2 mL/min (A) (by C-G formula based on SCr of 1.43 mg/dL (H)).   Medications:  PTA warfarin regimen: 3.75 mg daily except 2.5 mg on Sun & Thurs  Assessment: Patient's a 77 y.o M with hx recurrent prostate cancer with suspected mets to esophagus and h/o CAF and DVT/PE on warfarin PTA, presented to the ED on 12/5 with c/o SOB.  CTA shows small PE despite therapeutic INR of 2.75 today (goal 2.5 - 3.5 per clinic records). LE doppler on 12/4 neg for DVT.  Warfarin resumed on admission.  Today, 01/14/2018: - INR is therapeutic at 2.86 - cbc is stable - no bleeding documented  Goal of Therapy:  INR 2.5-3.5 Monitor platelets by anticoagulation protocol: Yes   Plan:  - warfarin 3 mg PO x1 today - daily INR - monitor for s/s bleeding - consider consulting heme/onc for recom. regarding anticoag. since pt has new PE with therapeutic INR  Ulice Dash D 01/14/2018,1:13 PM

## 2018-01-14 NOTE — Evaluation (Signed)
Physical Therapy Evaluation Patient Details Name: [name] MRN: [id number] DOB: December 29, 1940 Today's Date: 01/14/2018   History of Present Illness  Pt admitted with SOB 2* Pulmonary Fibrosis and with hx of PVD, DM, Bil TKR, L TSR and cervical fusion  Clinical Impression  Pt admitted as above and demonstrating ability to mobilize without physical assist but with increasing SOB with exertion.  Oxygen sat test performed with results reported to RN and recorded in separate PT note.  Pt with no further PT needs at this time and PT service to sign off.    Follow Up Recommendations No PT follow up    Equipment Recommendations  None recommended by PT    Recommendations for Other Services       Precautions / Restrictions Precautions Precautions: Fall Precaution Comments: Monitor Sats Restrictions Weight Bearing Restrictions: No      Mobility  Bed Mobility Overal bed mobility: Independent                Transfers Overall transfer level: Modified independent                  Ambulation/Gait Ambulation/Gait assistance: Supervision;Independent Gait Distance (Feet): 600 Feet Assistive device: None Gait Pattern/deviations: WFL(Within Functional Limits) Gait velocity: mod pace   General Gait Details: mild occasional instability but no LOB   Stairs            Wheelchair Mobility    Modified Rankin (Stroke Patients Only)       Balance Overall balance assessment: Mild deficits observed, not formally tested                                           Pertinent Vitals/Pain Pain Assessment: No/denies pain    Home Living Family/patient expects to be discharged to:: Private residence Living Arrangements: Spouse/significant other Available Help at Discharge: Family Type of Home: House Home Access: Stairs to enter Entrance Stairs-Rails: Left Entrance Stairs-Number of Steps: 2 Home Layout: One level Home Equipment: Shower seat -  built in;Hand held shower head      Prior Function Level of Independence: Independent               Hand Dominance   Dominant Hand: Right    Extremity/Trunk Assessment   Upper Extremity Assessment Upper Extremity Assessment: Overall WFL for tasks assessed    Lower Extremity Assessment Lower Extremity Assessment: Overall WFL for tasks assessed       Communication   Communication: No difficulties  Cognition Arousal/Alertness: Awake/alert Behavior During Therapy: WFL for tasks assessed/performed Overall Cognitive Status: Within Functional Limits for tasks assessed                                        General Comments      Exercises     Assessment/Plan    PT Assessment Patent does not need any further PT services  PT Problem List         PT Treatment Interventions      PT Goals (Current goals can be found in the Care Plan section)  Acute Rehab PT Goals Patient Stated Goal: HOME PT Goal Formulation: All assessment and education complete, DC therapy    Frequency     Barriers to discharge  Co-evaluation               AM-PAC PT "6 Clicks" Mobility  Outcome Measure Help needed turning from your back to your side while in a flat bed without using bedrails?: None Help needed moving from lying on your back to sitting on the side of a flat bed without using bedrails?: None Help needed moving to and from a bed to a chair (including a wheelchair)?: None Help needed standing up from a chair using your arms (e.g., wheelchair or bedside chair)?: None Help needed to walk in hospital room?: None Help needed climbing 3-5 steps with a railing? : None 6 Click Score: 24    End of Session Equipment Utilized During Treatment: Gait belt;Oxygen Activity Tolerance: Patient tolerated treatment well;Patient limited by fatigue;Other (comment)(desat O2 on RA) Patient left: in chair;with call bell/phone within reach Nurse Communication:  Mobility status;Other (comment)(O2 desat) PT Visit Diagnosis: Difficulty in walking, not elsewhere classified (R26.2)    Time: 8590-9311 PT Time Calculation (min) (ACUTE ONLY): 33 min   Charges:   PT Evaluation $PT Eval Low Complexity: 1 Low PT Treatments $Gait Training: 8-22 mins        Debe Coder PT Acute Rehabilitation Services Pager [phone] Office [phone]   Amador Braddy 01/14/2018, 12:27 PM

## 2018-01-14 NOTE — Progress Notes (Signed)
PROGRESS NOTE    [name]  ZHG:[id number] DOB: 05/11/1940 DOA: 01/12/2018 PCP: Jinny Sanders, MD   Brief Narrative:  HPI per Dr. Sander Radon on 01/12/18 [name] is a 77 y.o. male with medical history significant of hypertension, type 2 diabetes mellitus, remote history of DVT/PE, childhood asthma, recently diagnosed with prostate cancer.  Patient presents with 5 weeks of worsening dyspnea, which has rapidly progressed over the last 7 days, to a severe intensity, to the point where he is dyspneic at rest and has difficulty speaking, it has been associated with dry cough, occasional wheezing and chills, no increased sputum production, no improving or worsening factors.  No PND, orthopnea lower extremity edema.  His symptoms started with a flulike illness that rapidly progressed into dyspnea.  Before his symptoms started he was functional and independent, no limitations in physical exertion.  Occasional cough and dyspnea self resolved.  Never smoked cigarettes.  He has been treated as an outpatient with multiple antibiotics, and inhalers, with no improvement of his symptoms.  ED Course: Patient was deconditioned and ill looking appearing, dyspneic, he received oxygen, per nasal cannula.  For further admission for evaluation.  **Pulmonary and hematology were consulted for further evaluation recommendations.  Patient will be continued on IV Solu-Medrol along with nebulized breathing treatments and was weaned to IV 60 mg Daily.  Had home O2 screen done today he did qualify for oxygen 2 L and will be discharged on 2 L oxygen.  Neurology recommended patient placed on a steroid taper.  After discussion with cardiology they are in agreement with changing the patient to Eliquis however his INR was greater than 2.5 and will wait till the INR is less than 2 initiate Eliquis.  Assessment & Plan:   Active Problems:   Dyspnea   Atrial fibrillation (HCC)   Pulmonary fibrosis (HCC)  Pulmonary embolus (HCC)  Pulmonary fibrosis with suspected acute exacerbation.  -Patient was admitted to the medical ward, he will be placed on high-dose systemic steroids with methylprednisolone 60 mg q6h is now changed to methylprednisolone 60 mg daily by pulmonary and pulmonary recommends discharging on a steroid taper with 40 mg of prednisone daily and tapering 10 mg every 4 days until he is off of the taper -C/w Aggressive bronchodilator therapy, oximetry monitoring and supplemental oxygen per nasal cannula to target O2 saturation greater 92%.   -Likely patient also has pulmonary hypertension, and an echocardiography was also done.  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 Eartha Inch recommending using current dose of steroids for the next 24 hours especially see if we can provide him any relief for his cough and then decreasing rapidly -Dr. Elsworth Soho also recommends full PFTs as an outpatient to establish new baseline and assess for oxygen on exertion prior to discharge.  Dr. Hector Shade feel's the patient will need pulmonary rehab as an outpatient and consider home physical therapy if he qualifies -Appreciate pulmonary following and further evaluation recommendations. -Home O2 screen done patient did desaturate and was placed on 2 L -We will need oxygen at discharge -PT evaluated and did not recommend any home health PT  Remote history of DVT and pulmonary embolism.   -Chest CT with a small segmental to subsegmental pulmonary embolism, this is very unlikely to explain patient's symptoms,  -Will continue anticoagulation with warfarin -Ultrasonography of the lower extremities showed no DVT  -ECHOCardiogram showed EF of 55-60% with  severely dilated LA and Right Ventricle Systolic Fxn being Normal -Hematology consulted for new PE on Warfarin and feels taht the LUL PE is very small and unlikely  responsible for acute worsening dyspne and cough -? Unclear if patient failed Lovenox -Dr. Maylon Peppers recommending Therapeutic Lovenox but patient is hesitant about this life long injection. -Alternative Option includes Eliquis and patient is agreeble to Eliquis and starting after INR is <2; discussed the case with his cardiologist and they are okay with starting Eliquis.  Will wait till his INR is less than 2 to initiate Eliquis and his Coumadin will be held tonight  Chronic Atrial Fibrillation -On Coumadin but will hold and start Eliquis -Oncology Recommending switching to Eliquis -Discussed with Cardiology Dr. Margaretann Loveless who has no hesitation in starting Eliquis -Not on Any Rate or Rhythm Control currently -Pharmacy to Manage anticoagulation currently  Dyslipidemia. -Continue Atorvastatin 80 mg po Daily and Omega-3 Acid Etyhl Esters   History of asthma -Continue inhaled corticosteroids 60 mg q6h for today and change to IV Solu-Medrol 60 mg daily with prednisone taper at discharge -C/w DuoNeb TID and q2hprn for wheezing and SOB  GERD -Continue Antiacid therapy with pantoprazole and sucralfate.   Recently diagnosed prostate cancer.   -No signs of urinary retention,  -continue Flomax and follow-up as an outpatient.  DVT prophylaxis: Anticoagulated with Coumadin will be changed to Eliquis Code Status: FULL CODE Family Communication: No family present at bedside Disposition Plan: Dissipate discharging home once able to start Eliquis when INR less than 2 at the recommendation by hematology oncology  Consultants:   Hematology  Pulmonary   Procedures:  ECHOCARDIOGRAM ------------------------------------------------------------------- Study Conclusions  - Left ventricle: The cavity size was normal. There was mild   concentric hypertrophy. Systolic function was normal. The   estimated ejection fraction was in the range of 55% to 60%. Wall   motion was normal; there were no regional  wall motion   abnormalities. The study was not technically sufficient to allow   evaluation of LV diastolic dysfunction due to atrial   fibrillation. - Aortic valve: Transvalvular velocity was minimally increased.   There was no stenosis. There was mild regurgitation. - Mitral valve: There was mild regurgitation. - Left atrium: The atrium was severely dilated. - Right ventricle: Systolic function was normal. - Right atrium: The atrium was moderately dilated. - Tricuspid valve: There was mild regurgitation. - Pulmonic valve: There was no regurgitation. - Pulmonary arteries: Systolic pressure was mildly increased. PA   peak pressure: 38 mm Hg (S). - Inferior vena cava: The vessel was normal in size. - Pericardium, extracardiac: There was no pericardial effusion.    Antimicrobials:  Anti-infectives (From admission, onward)   None     Subjective: Seen and examined at bedside and states that he is feeling somewhat better.  No nausea or vomiting.  States he had no leg swelling.  No chest pain, lightheadedness or dizziness.  Became very dyspneic on ambulation with physical therapy but then recovered after being placed on oxygen.  Will need oxygen at discharge.  Patient has elected to go to Eliquis and will wait for his INR to be less than 2 to initiate Eliquis and discharged home  Objective: Vitals:   01/13/18 2125 01/14/18 0614 01/14/18 0844 01/14/18 1430  BP: 105/66 106/88  104/68  Pulse: 80 69  79  Resp: 16 18    Temp: 98.1 F (36.7 C) (!) 97.5 F (36.4 C)  97.9 F (36.6 C)  TempSrc: Oral Oral  Oral  SpO2: 100% 100% 95% 100%  Weight:      Height:        Intake/Output Summary (Last 24 hours) at 01/14/2018 1725 Last data filed at 01/14/2018 0900 Gross per 24 hour  Intake 475 ml  Output -  Net 475 ml   Filed Weights   01/13/18 0106  Weight: 80.3 kg   Examination: Physical Exam:  Constitutional: Well-nourished, well-developed thin elderly Caucasian male currently no  acute distress appears calm and a little bit more comfortable today Eyes: Lids and conjunctive are normal.  Sclera anicteric ENMT: External ears and nose appear normal.  Grossly normal hearing Neck: Appears supple no JVD Respiratory: Diminished to auscultation bilaterally with some coarse breath sounds.  No appreciable wheezing, rales, rhonchi but patient was wearing supplemental oxygen via nasal cannula.  Cardiovascular: Regular rate and rhythm.  No appreciable murmurs, rubs or gallops.  No extremity edema no Abdomen: Soft, nontender, nondistended.  Bowel sounds present all 4 quadrants GU: Deferred Musculoskeletal: No contractures or cyanosis.  Normal strength Skin: No appreciable rashes or lesions on limited skin evaluation Neurologic: Cranial nerves II through XII grossly intact no appreciable focal deficits Psychiatric: Pleasant mood and affect.  Intact judgment and insight.  Patient is awake alert and oriented x3  Data Reviewed: I have personally reviewed following labs and imaging studies  CBC: Recent Labs  Lab 01/10/18 1657 01/12/18 1508 01/13/18 0351 01/14/18 0440  WBC 13.8* 9.7 7.8 17.6*  NEUTROABS 10.7* 6.4  --  15.9*  HGB 13.1 11.2* 11.4* 11.3*  HCT 39.8 35.6* 35.3* 35.0*  MCV 90.9 93.2 91.5 93.6  PLT 340.0 309 280 606   Basic Metabolic Panel: Recent Labs  Lab 01/10/18 1657 01/12/18 1508 01/13/18 0351 01/14/18 0440  NA 134* 135 135 132*  K 4.2 4.6 4.4 4.2  CL 99 100 101 100  CO2 24 26 21* 22  GLUCOSE 191* 140* 204* 307*  BUN 22 26* 27* 33*  CREATININE 1.40 1.46* 1.40* 1.43*  CALCIUM 8.7 8.3* 8.2* 8.2*  MG  --   --   --  2.2  PHOS  --   --   --  3.2   GFR: Estimated Creatinine Clearance: 48.2 mL/min (A) (by C-G formula based on SCr of 1.43 mg/dL (H)). Liver Function Tests: Recent Labs  Lab 01/10/18 1657 01/12/18 1508 01/14/18 0440  AST 20 24 25   ALT 13 14 17   ALKPHOS 93 74 79  BILITOT 0.4 0.5 0.4  PROT 7.0 6.7 6.4*  ALBUMIN 3.6 3.0* 2.9*   No  results for input(s): LIPASE, AMYLASE in the last 168 hours. No results for input(s): AMMONIA in the last 168 hours. Coagulation Profile: Recent Labs  Lab 01/12/18 1508 01/13/18 0351 01/14/18 0440  INR 2.75 2.56 2.86   Cardiac Enzymes: No results for input(s): CKTOTAL, CKMB, CKMBINDEX, TROPONINI in the last 168 hours. BNP (last 3 results) Recent Labs    01/10/18 1657  PROBNP 123.0*   HbA1C: No results for input(s): HGBA1C in the last 72 hours. CBG: Recent Labs  Lab 01/14/18 1216  GLUCAP 262*   Lipid Profile: No results for input(s): CHOL, HDL, LDLCALC, TRIG, CHOLHDL, LDLDIRECT in the last 72 hours. Thyroid Function Tests: No results for input(s): TSH, T4TOTAL, FREET4, T3FREE, THYROIDAB in the last 72 hours. Anemia Panel: No results for input(s): VITAMINB12, FOLATE, FERRITIN, TIBC, IRON, RETICCTPCT in the last 72 hours. Sepsis Labs: No results for input(s): PROCALCITON, LATICACIDVEN in the last 168 hours.  No results found for  this or any previous visit (from the past 240 hour(s)).   Radiology Studies: Dg Chest Port 1 View  Addendum Date: 01/14/2018   ADDENDUM REPORT: 01/14/2018 06:35 ADDENDUM: The sentence should read focal density is now seen in the bases bilaterally Electronically Signed   By: Inez Catalina M.D.   On: 01/14/2018 06:35   Result Date: 01/14/2018 CLINICAL DATA:  Shortness of breath EXAM: PORTABLE CHEST 1 VIEW COMPARISON:  01/12/2018 FINDINGS: Cardiac shadow is stable. Aortic calcifications are again noted. The lungs are again well aerated with diffuse fibrotic changes similar to that seen on the prior CT examination. Some increased focal density is noted in the bases bilaterally likely representing acute superimposed infiltrate. No effusion or pneumothorax is seen. Postsurgical changes in the left shoulder are noted. IMPRESSION: Acute on chronic basilar infiltrates with underlying fibrotic change. Electronically Signed: By: Inez Catalina M.D. On: 01/14/2018  06:32   Scheduled Meds: . acarbose  50 mg Oral TID WC  . atorvastatin  80 mg Oral QHS  . famotidine  20 mg Oral Daily  . FLUoxetine  60 mg Oral QHS  . fluticasone furoate-vilanterol  1 puff Inhalation Daily  . gabapentin  300 mg Oral BID  . insulin aspart  0-5 Units Subcutaneous QHS  . insulin aspart  0-9 Units Subcutaneous TID WC  . ipratropium-albuterol  3 mL Nebulization TID  . [START ON 01/15/2018] methylPREDNISolone (SOLU-MEDROL) injection  60 mg Intravenous Daily  . omega-3 acid ethyl esters  1 g Oral Daily  . pantoprazole  40 mg Oral Daily  . sucralfate  1 g Oral TID AC  . tamsulosin  0.4 mg Oral QHS  . vitamin E  400 Units Oral Daily   Continuous Infusions:   LOS: 2 days   Kerney Elbe, DO [facility] PAGER is on AMION  If 7PM-7AM, please contact night-coverage [email or web address] Password [email or web address] 01/14/2018, 5:25 PM

## 2018-01-14 NOTE — Progress Notes (Signed)
Inpatient Diabetes Program Recommendations  AACE/ADA: New Consensus Statement on Inpatient Glycemic Control (2015)  Target Ranges:  Prepandial:   less than 140 mg/dL      Peak postprandial:   less than 180 mg/dL (1-2 hours)      Critically ill patients:  140 - 180 mg/dL   Results for [name], [name] (MRN [id number]) as of 01/14/2018 09:54  Ref. Range 01/12/2018 15:08 01/13/2018 03:51 01/14/2018 04:40  Glucose Latest Ref Range: 70 - 99 mg/dL 140 (H) 204 (H) 307 (H)     Admit with: SOB/ Pulmonary fibrosis   History: DM  Home DM Meds: Acarbose 50 mg TID  Current Orders: Acarbose 50 mg TID     Getting Solumedrol 60 mg Q6 hours   History of DM per H&P  Lab glucose elevated the last 2 days.   MD- Please consider starting Novolog Sensitive Correction Scale/ SSI (0-9 units) TID AC + HS    --Will follow patient during hospitalization--  Wyn Quaker RN, MSN, CDE Diabetes Coordinator Inpatient Glycemic Control Team Team Pager: [phone] (8a-5p)

## 2018-01-14 NOTE — Progress Notes (Signed)
Name: [name] MRN: [id number] DOB: [birth year]    ADMISSION DATE:  01/12/2018 CONSULTATION DATE:  01/14/2018  REFERRING MD :  Alfredia Ferguson  CHIEF COMPLAINT:  Dyspnea & cough   HISTORY OF PRESENT ILLNESS: 76 year old never smoker referred from pulmonary office for admission due to abnormal CT angiogram.  He underwent initial consultation 12/2 with Dr. Lamonte Sakai for 5 weeks of shortness of breath and coughing.  He has been treated as outpatient with prednisone and azithromycin and Levaquin.  His Coumadin level was at goal.  He has been in Coumadin since the 90s for chronic atrial fibrillation and for VTE. New diagnosis of IPF   He has a history of pulmonary embolism more than 10 years ago.  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  He has lost 15 pounds over the past 6 weeks   SIGNIFICANT EVENTS    STUDIES:  CT angiogram chest 12/4  showed tiny subsegmental pulmonary embolus in the left upper lobe and may be in the distal lingula but more importantly showed significant interstitial basilar predominant fibrotic lung disease and scattered areas of groundglass attenuation and traction bronchiectasis.  I personally reviewed his films and to be does also show some honeycombing. There was also thickening of the distal esophagus noted. CT abdomen 12/4   REVIEW OF SYSTEMS:   Cough is improved More short of breath walking to the nursing station. Denies chest pain Afebrile  VITAL SIGNS: Temp:  [97.5 F (36.4 C)-98.1 F (36.7 C)] 97.5 F (36.4 C) (12/06 1517) Pulse Rate:  [69-88] 69 (12/06 0614) Resp:  [16-18] 18 (12/06 0614) BP: (105-120)/(66-88) 106/88 (12/06 0614) SpO2:  [93 %-100 %] 95 % (12/06 0844)  PHYSICAL EXAMINATION: Gen. Pleasant, obese, in no distress, normal affect ENT - no lesions, no post nasal drip, class 2-3 airway Neck: No JVD, no thyromegaly, no carotid bruits Lungs: no use of accessory muscles, no dullness to  percussion, biasal 1/2 rales no rhonchi  Cardiovascular: Rhythm regular, heart sounds  normal, no murmurs or gallops, no peripheral edema Abdomen: soft and non-tender, no hepatosplenomegaly, BS normal. Musculoskeletal: No deformities, no cyanosis or clubbing Neuro:  alert, non focal, no tremors   Recent Labs  Lab 01/12/18 1508 01/13/18 0351 01/14/18 0440  NA 135 135 132*  K 4.6 4.4 4.2  CL 100 101 100  CO2 26 21* 22  BUN 26* 27* 33*  CREATININE 1.46* 1.40* 1.43*  GLUCOSE 140* 204* 307*   Recent Labs  Lab 01/12/18 1508 01/13/18 0351 01/14/18 0440  HGB 11.2* 11.4* 11.3*  HCT 35.6* 35.3* 35.0*  WBC 9.7 7.8 17.6*  PLT 309 280 277    ASSESSMENT / PLAN:  Idiopathic pulmonary fibrosis- CT chest shows honeycombing [even though it is not a high-resolution CT] which would be very indicative of UIP with bibasal predominance especially peripheral and traction bronchiectasis.  In this 77 year old without an alternative diagnosis, this would be diagnostic of IPF.  On review of his serial abdominal scans that he has had over the years, these seem to date back to 2012.  Although symptomatically he has been worse only for the past 5 to 6 weeks. Doubt IPF flare, favor progression of disease  Recommend- Check ANA, CCP Decrease Solu-Medrol 60 daily, can discharge on 40 mg of prednisone , taper by 10 mg every 4 days to off Full PFTs as outpatient to establish new baseline  Portable oxygen on exertion, he will need pulmonary rehab  as outpatient would consider home physical therapy if he qualifies Briefly discussed anti-fibrotic medications with him and this discussion can be continued as outpatient.   Finding of small subsegmental PE-of doubtful clinical significance, INR was therapeutic on admit and I agree with hematology that this does not represent Coumadin failure.  PCCM available as needed, outpatient follow-up will be made in 1 week  Kara Mead MD. [facility]. [facility] Pager [phone] If no response call [phone]   01/14/2018, 11:45 AM

## 2018-01-15 DIAGNOSIS — I4819 Other persistent atrial fibrillation: Secondary | ICD-10-CM

## 2018-01-15 LAB — CBC WITH DIFFERENTIAL/PLATELET
Abs Immature Granulocytes: 0.13 10*3/uL — ABNORMAL HIGH (ref 0.00–0.07)
Basophils Absolute: 0 10*3/uL (ref 0.0–0.1)
Basophils Relative: 0 %
EOS ABS: 0.1 10*3/uL (ref 0.0–0.5)
Eosinophils Relative: 1 %
HEMATOCRIT: 34 % — AB (ref 39.0–52.0)
Hemoglobin: 10.7 g/dL — ABNORMAL LOW (ref 13.0–17.0)
Immature Granulocytes: 1 %
Lymphocytes Relative: 10 %
Lymphs Abs: 1.4 10*3/uL (ref 0.7–4.0)
MCH: 30 pg (ref 26.0–34.0)
MCHC: 31.5 g/dL (ref 30.0–36.0)
MCV: 95.2 fL (ref 80.0–100.0)
Monocytes Absolute: 1.3 10*3/uL — ABNORMAL HIGH (ref 0.1–1.0)
Monocytes Relative: 10 %
Neutro Abs: 11 10*3/uL — ABNORMAL HIGH (ref 1.7–7.7)
Neutrophils Relative %: 78 %
Platelets: 293 10*3/uL (ref 150–400)
RBC: 3.57 MIL/uL — ABNORMAL LOW (ref 4.22–5.81)
RDW: 13.2 % (ref 11.5–15.5)
WBC: 14 10*3/uL — ABNORMAL HIGH (ref 4.0–10.5)
nRBC: 0 % (ref 0.0–0.2)

## 2018-01-15 LAB — EXTRACTABLE NUCLEAR ANTIGEN ANTIBODY
Ribonucleic Protein: 0.4 AI (ref 0.0–0.9)
SSA (Ro) (ENA) Antibody, IgG: <0.2 AI (ref 0.0–0.9)
Scleroderma (Scl-70) (ENA) Antibody, IgG: <0.2 AI (ref 0.0–0.9)
ds DNA Ab: <1 IU/mL (ref 0–9)

## 2018-01-15 LAB — COMPREHENSIVE METABOLIC PANEL
ALT: 17 U/L (ref 0–44)
AST: 21 U/L (ref 15–41)
Albumin: 2.8 g/dL — ABNORMAL LOW (ref 3.5–5.0)
Alkaline Phosphatase: 79 U/L (ref 38–126)
Anion gap: 8 (ref 5–15)
BILIRUBIN TOTAL: 0.5 mg/dL (ref 0.3–1.2)
BUN: 36 mg/dL — ABNORMAL HIGH (ref 8–23)
CO2: 26 mmol/L (ref 22–32)
Calcium: 8.3 mg/dL — ABNORMAL LOW (ref 8.9–10.3)
Chloride: 103 mmol/L (ref 98–111)
Creatinine, Ser: 1.47 mg/dL — ABNORMAL HIGH (ref 0.61–1.24)
GFR calc Af Amer: 53 mL/min — ABNORMAL LOW (ref >60)
GFR, EST NON AFRICAN AMERICAN: 46 mL/min — AB (ref >60)
Glucose, Bld: 205 mg/dL — ABNORMAL HIGH (ref 70–99)
Potassium: 4.5 mmol/L (ref 3.5–5.1)
Sodium: 137 mmol/L (ref 135–145)
TOTAL PROTEIN: 6 g/dL — AB (ref 6.5–8.1)

## 2018-01-15 LAB — PROTIME-INR
INR: 3.85
PROTHROMBIN TIME: 37.3 s — AB (ref 11.4–15.2)

## 2018-01-15 LAB — PHOSPHORUS: Phosphorus: 3 mg/dL (ref 2.5–4.6)

## 2018-01-15 LAB — CYCLIC CITRUL PEPTIDE ANTIBODY, IGG/IGA: CCP Antibodies IgG/IgA: 8 units (ref 0–19)

## 2018-01-15 LAB — GLUCOSE, CAPILLARY
Glucose-Capillary: 186 mg/dL — ABNORMAL HIGH (ref 70–99)
Glucose-Capillary: 173 mg/dL — ABNORMAL HIGH (ref 70–99)

## 2018-01-15 LAB — MAGNESIUM: Magnesium: 2.4 mg/dL (ref 1.7–2.4)

## 2018-01-15 MED ORDER — HYDROCODONE-HOMATROPINE 5-1.5 MG/5ML PO SYRP
5.0000 mL | ORAL_SOLUTION | Freq: Once | ORAL | Status: AC
  Administered 2018-01-15: 5 mL via ORAL
  Filled 2018-01-15: qty 5

## 2018-01-15 MED ORDER — APIXABAN 5 MG PO TABS: ORAL_TABLET | ORAL | 0 refills | Status: DC

## 2018-01-15 NOTE — Care Management Important Message (Signed)
Important Message  Patient Details  Name: [name] MRN: [id number] Date of Birth: 09-Dec-1940   Medicare Important Message Given:  Yes    Erenest Rasher, RN 01/15/2018, 2:55 PM

## 2018-01-15 NOTE — Progress Notes (Deleted)
Pt and his wife are requesting to leave without oxygen. Pt's wife was educated on his need for oxygen by the charge nurse, Beverlee Nims T.) Pt and wife has talked to the charge nurse and she made them aware that they could leave with refusing oxygen but he runs the risk of dropping his oxygen saturation. In turn, he could have to be seen in the emergency room. Wife states that she knows that and that nothing has happened to him so far with no oxygen.

## 2018-01-15 NOTE — Discharge Instructions (Signed)
Information on my medicine - ELIQUIS (apixaban)  This medication education was reviewed with me or my healthcare representative as part of my discharge preparation.  The pharmacist that spoke with me during my hospital stay was:  Emiliano Dyer, [facility]  Why was Eliquis prescribed for you? Eliquis was prescribed to treat blood clots that may have been found in the veins of your legs (deep vein thrombosis) or in your lungs (pulmonary embolism) and to reduce the risk of them occurring again.  What do You need to know about Eliquis ? The starting dose is 10 mg (two 5 mg tablets) taken TWICE daily for the FIRST SEVEN (7) DAYS, then the dose is reduced to ONE 5 mg tablet taken TWICE daily.  Eliquis may be taken with or without food.   Try to take the dose about the same time in the morning and in the evening. If you have difficulty swallowing the tablet whole please discuss with your pharmacist how to take the medication safely.  Take Eliquis exactly as prescribed and DO NOT stop taking Eliquis without talking to the doctor who prescribed the medication.  Stopping may increase your risk of developing a new blood clot.  Refill your prescription before you run out.  After discharge, you should have regular check-up appointments with your healthcare provider that is prescribing your Eliquis.    What do you do if you miss a dose? If a dose of ELIQUIS is not taken at the scheduled time, take it as soon as possible on the same day and twice-daily administration should be resumed. The dose should not be doubled to make up for a missed dose.  Important Safety Information A possible side effect of Eliquis is bleeding. You should call your healthcare provider right away if you experience any of the following: ? Bleeding from an injury or your nose that does not stop. ? Unusual colored urine (red or dark brown) or unusual colored stools (red or black). ? Unusual bruising for unknown reasons. ? A  serious fall or if you hit your head (even if there is no bleeding).  Some medicines may interact with Eliquis and might increase your risk of bleeding or clotting while on Eliquis. To help avoid this, consult your healthcare provider or pharmacist prior to using any new prescription or non-prescription medications, including herbals, vitamins, non-steroidal anti-inflammatory drugs (NSAIDs) and supplements.  This website has more information on Eliquis (apixaban): http://www.eliquis.com/eliquis/home

## 2018-01-15 NOTE — Care Management Note (Addendum)
Case Management Note  Patient Details  Name: [name] MRN: [id number] Date of Birth: [birth year]  Subjective/Objective:   Pulmonary fibrosis with suspected acute exacerbation, PE                 Action/Plan: NCM spoke to pt and wife and explained [facility] will deliver portable oxygen to room and oxygen concentrator to his home. Provided pt with Eliquis 30 day free trial card and explained he can go to the [facility] on [address] in [place]. His pharmacy does not have in stock.   Expected Discharge Date:  01/15/18               Expected Discharge Plan:  Home/Self Care  In-House Referral:  NA  Discharge planning Services  CM Consult  Post Acute Care Choice:  NA Choice offered to:  NA  DME Arranged:  Oxygen DME Agency:  [place]:  NA [place] Agency:  NA  Status of Service:  Completed, signed off  If discussed at [place] of Stay Meetings, dates discussed:    Additional Comments:  Erenest Rasher, RN 01/15/2018, 2:27 PM

## 2018-01-15 NOTE — Progress Notes (Signed)
SATURATION QUALIFICATIONS: (This note is used to comply with regulatory documentation for home oxygen)  Patient Saturations on Room Air at Rest = 88%  Patient Saturations on Room Air while Ambulating = 83%  Patient Saturations on 2 Liters of oxygen while Ambulating = 91%  Please briefly explain why patient needs home oxygen: Saturations below 90 while at rest and ambulating

## 2018-01-16 NOTE — Discharge Summary (Addendum)
Physician Discharge Summary  [name] DTO:[id number] DOB: 1940/04/26 DOA: 01/12/2018  PCP: Jinny Sanders, MD  Admit date: 01/12/2018 Discharge date: 01/16/2018  Admitted From: Home Disposition: Home  Recommendations for Outpatient Follow-up:  1. Follow up with PCP in 1-2 weeks 2. Follow up with Cardiology within 1 week 3. Follow up with Pulmonary within 1 week 4. Follow up with GI for Distal Wall Esophageal Thickening 5. Follow-up with pathology if necessary 6. Repeat Daily PT-INR until INR is <2 and then start Eliquis 7. Please obtain CMP/CBC, Mag, Phos in one week 8. Please follow up on the following pending results:  Home Health: No Equipment/Devices: Yes DME O2 2 Liters  Discharge Condition: Stable  CODE STATUS: FULL CODE  Diet recommendation: Heart Healthy  Brief/Interim Summary: HPI per Dr. Riccardo Dubin Arrien on 01/12/18 Jonathan Bean Newsomeis a 77 y.o.malewith medical history significant ofhypertension, type 2 diabetes mellitus, remote history of DVT/PE, childhood asthma, recently diagnosed with prostate cancer. Patient presents with 5 weeks of worsening dyspnea, which has rapidly progressed over the last 7 days,to a severe intensity, to the point where he is dyspneic at rest and has difficulty speaking,it has been associated with dry cough, occasional wheezing and chills, no increased sputum production,no improving or worsening factors. No PND, orthopnea lower extremity edema.  His symptoms started with a flulike illness that rapidly progressed into dyspnea. Before his symptoms started he was functional and independent, no limitations in physical exertion. Occasional cough and dyspnea self resolved. Never smoked cigarettes.  He has been treated as an outpatient with multiple antibiotics, and inhalers, with no improvement of his symptoms.  ED Course:Patient was deconditioned and ill looking appearing, dyspneic, he received oxygen, per nasal cannula. For  further admission for evaluation.  **Pulmonary and hematology were consulted for further evaluation recommendations.  Patient will be continued on IV Solu-Medrol along with nebulized breathing treatments and was weaned to IV 60 mg Daily and changed to po Prednisone taper.  Had home O2 screen done today he did qualify for oxygen 2 L and will be discharged on 2 L oxygen.  Pulmonary recommended patient placed on a steroid taper.  After discussion with Cardiology they are in agreement with changing the patient to Eliquis however his INR was greater than 2.5 and will wait till the INR is less than 2 initiate Eliquis.  His INR today was 3.86 and I advised the patient to hold his Coumadin until his INR is less than 2 and start Eliquis.  Patient's respiratory status improved on oxygen and pulmonary is to see the patient in outpatient setting per day 7.  He will need to follow-up with PCP and cardiology as well and he will be discharged home as he is deemed medically stable at this time.  Discharge Diagnoses:  Active Problems:   Dyspnea   Atrial fibrillation (HCC)   Pulmonary fibrosis (HCC)   Pulmonary embolus (HCC)  Pulmonary fibrosis with suspected acute exacerbation.  -Patient was admitted to the medical ward, he will be placed on high-dose systemic steroids with methylprednisolone 60 mg q6h is now changed to methylprednisolone 60 mg daily by pulmonary and pulmonary recommends discharging on a steroid taper with 40 mg of prednisone daily and tapering 10 mg every 4 days until he is off of the taper we will continue this taper -C/w Aggressive bronchodilator therapy,oximetry monitoring and supplemental oxygenper nasal cannula to target O2 saturation greater92%.  -Likely patient also has pulmonary hypertension, and anechocardiography was also done.  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 Elsworth Soho recommending using current dose of steroids for the next 24 hours especially see if we can provide him any relief for his cough and then decreasing rapidly -Dr. Elsworth Soho also recommends full PFTs as an outpatient to establish new baseline and assess for oxygen on exertion prior to discharge.  Dr. Hector Shade feel's the patient will need pulmonary rehab as an outpatient and consider home physical therapy if he qualifies -Appreciate pulmonary following and further evaluation recommendations. -Home O2 screen done patient did desaturate and was placed on 2 L -We will need oxygen at discharge -PT evaluated and did not recommend any home health PT -Patient is to follow-up with PCP within 1 week as well as pulmonary within 1 week  Remote history of DVT and pulmonary embolism. -Chest CTwith a small segmental to subsegmental pulmonary embolism,this is very unlikely to explain patient's symptoms,  -Will continue anticoagulation with warfarin -Ultrasonography of the lower extremities showed no DVT  -ECHOCardiogram showed EF of 55-60% with severely dilated LA and Right Ventricle Systolic Fxn being Normal -Hematology consulted for new PE on Warfarin and feels taht the LUL PE is very small and unlikely responsible for acute worsening dyspne and cough -? Unclear if patient failed Lovenox -Dr. Maylon Peppers recommending Therapeutic Lovenox but patient is hesitant about this life long injection. -Alternative Option includes Eliquis and patient is agreeble to Eliquis and starting after INR is <2; discussed the case with his cardiologist and they are okay with starting Eliquis.  Will wait till his INR is less than 2 to initiate Eliquis and his Coumadin will be held and his INR was 3.86 so we will discharge home on 330-day sample of Eliquis and I have advised the patient to do daily INR checks and he will need to follow-up with cardiology in outpatient setting  Chronic Atrial Fibrillation -On  Coumadin but will hold and start Eliquis -Oncology Recommending switching to Eliquis -Discussed with Cardiology Dr. Margaretann Loveless who has no hesitation in starting Eliquis -Not on Any Rate or Rhythm Control currently -Pharmacy to Manage anticoagulation currently and will hold Coumadin and changed to Eliquis once INR less than 2.  I have advised the patient to do daily INR check starting Monday and he will follow-up with his cardiologist within 1 week  Dyslipidemia. -Continue Atorvastatin 80 mg po Daily and Omega-3 Acid Etyhl Esters   History of asthma -Continue inhaled corticosteroids 60 mg q6h for today and change to IV Solu-Medrol 60 mg daily with prednisone taper at discharge -C/w DuoNeb TID and q2hprn for wheezing and SOB  GERD -Continue Antiacid therapy with pantoprazole and sucralfate.   Recently diagnosed prostate cancer.  -No signs of urinary retention,  -continue Flomax and follow-up as an outpatient.  Distal esophageal wall irregular thickening -Seen on CTA -We will need outpatient gastric evaluation -We will have Oncology weigh in on their opinion  Discharge Instructions  Discharge Instructions    AMB referral to pulmonary rehabilitation   Complete by:  As directed    Please select a program:  Respiratory Care Services   Respiratory Care Services Diagnosis:  Interstitial Lung Disease   Program Prescription:   O2 Administration by RT, EP, or RN if SpO2<88% Flutter Valve if indicated     Call MD for:  difficulty breathing, headache or visual disturbances   Complete by:  As directed    Call  MD for:  extreme fatigue   Complete by:  As directed    Call MD for:  hives   Complete by:  As directed    Call MD for:  persistant dizziness or light-headedness   Complete by:  As directed    Call MD for:  persistant nausea and vomiting   Complete by:  As directed    Call MD for:  redness, tenderness, or signs of infection (pain, swelling, redness, odor or green/yellow  discharge around incision site)   Complete by:  As directed    Call MD for:  severe uncontrolled pain   Complete by:  As directed    Call MD for:  temperature >100.4   Complete by:  As directed    Diet - low sodium heart healthy   Complete by:  As directed    Discharge instructions   Complete by:  As directed    You were cared for by a hospitalist during your hospital stay. If you have any questions about your discharge medications or the care you received while you were in the hospital after you are discharged, you can call the unit and ask to speak with the hospitalist on call if the hospitalist that took care of you is not available. Once you are discharged, your primary care physician will handle any further medical issues. Please note that NO REFILLS for any discharge medications will be authorized once you are discharged, as it is imperative that you return to your primary care physician (or establish a relationship with a primary care physician if you do not have one) for your aftercare needs so that they can reassess your need for medications and monitor your lab values.  Follow up with PCP, Pulmonary, and Cardiology as well as Hematology  in the outpatient setting. Take all medications as prescribed. If symptoms change or worsen please return to the ED for evaluation   Increase activity slowly   Complete by:  As directed      Allergies as of 01/15/2018      Reactions   Sulfacetamide Sodium Swelling   throat swelling   Latex Itching   Severe contact dermatitis   Adhesive [tape] Other (See Comments)   Tears skin-- "No tape of any kind, they all tear skin right off"      Medication List    STOP taking these medications   warfarin 2.5 MG tablet Commonly known as:  COUMADIN     TAKE these medications   acarbose 50 MG tablet Commonly known as:  PRECOSE Take 50 mg by mouth 3 (three) times daily with meals.   apixaban 5 MG Tabs tablet Commonly known as:  ELIQUIS Take 10 (two  5 mg Tablets) BID x 7 days, and then 5 mg po BID   atorvastatin 80 MG tablet Commonly known as:  LIPITOR Take 1 tablet (80 mg total) by mouth at bedtime.   benzonatate 200 MG capsule Commonly known as:  TESSALON Take 1 capsule (200 mg total) by mouth 3 (three) times daily as needed for cough.   chlorpheniramine-HYDROcodone 10-8 MG/5ML Suer Commonly known as:  TUSSIONEX Take 5 mLs by mouth every 12 (twelve) hours as needed for cough. What changed:  how much to take   Fish Oil 1000 MG Caps Take 1,000 mg by mouth daily.   FLUoxetine 20 MG capsule Commonly known as:  PROZAC TAKE 3 CAPSULES BY MOUTH EVERYDAY AT BEDTIME What changed:  See the new instructions.   Fluticasone-Salmeterol 250-50 MCG/DOSE  Aepb Commonly known as:  ADVAIR Inhale 1 puff into the lungs 2 (two) times daily. What changed:    when to take this  reasons to take this   furosemide 20 MG tablet Commonly known as:  LASIX Take 1 tablet (20 mg total) by mouth every other day. What changed:  when to take this   gabapentin 300 MG capsule Commonly known as:  NEURONTIN TAKE 1 CAPSULE BY MOUTH TWICE A DAY   hydrocortisone 2.5 % cream Apply 1 application topically 2 (two) times daily as needed (itching).   hydroxypropyl methylcellulose / hypromellose 2.5 % ophthalmic solution Commonly known as:  ISOPTO TEARS / GONIOVISC Place 1 drop into both eyes 2 (two) times daily as needed for dry eyes.   ipratropium-albuterol 0.5-2.5 (3) MG/3ML Soln Commonly known as:  DUONEB Take 3 mLs by nebulization every 4 (four) hours as needed. What changed:  when to take this   omeprazole 40 MG capsule Commonly known as:  PRILOSEC TAKE 1 CAPSULE (40 MG TOTAL) BY MOUTH DAILY. What changed:  when to take this   PRESCRIPTION MEDICATION Apply 1 g topically 4 (four) times daily as needed (pain). Baclofen/gabapentin/lidocaine compounded cream. Applies 1g (1pump) to affected area up to four times a day for   primidone 50 MG  tablet Commonly known as:  MYSOLINE 150 mg in the morning, 100 mg at night What changed:    how much to take  how to take this  when to take this   ranitidine 300 MG tablet Commonly known as:  ZANTAC Take 300 mg by mouth at bedtime.   sucralfate 1 g tablet Commonly known as:  CARAFATE Take 1 g by mouth 3 (three) times daily before meals.   tamsulosin 0.4 MG Caps capsule Commonly known as:  FLOMAX Take 0.4 mg by mouth at bedtime.   tiZANidine 4 MG tablet Commonly known as:  ZANAFLEX Take 4 mg by mouth every 6 (six) hours as needed for muscle spasms.   vitamin E 400 UNIT capsule Take 400 Units by mouth daily.   zolpidem 12.5 MG CR tablet Commonly known as:  AMBIEN CR Take 12.5 mg by mouth at bedtime.      Follow-up Information    Martyn Ehrich, NP Follow up on 01/21/2018.   Specialty:  Pulmonary Disease Why:  230p Contact information: [place] [address] [place] [address] [phone]          Allergies  Allergen Reactions  . Sulfacetamide Sodium Swelling    throat swelling  . Latex Itching    Severe contact dermatitis  . Adhesive [Tape] Other (See Comments)    Tears skin-- "No tape of any kind, they all tear skin right off"   Consultations:  Pulmonary  Procedures/Studies: Dg Chest 2 View  Result Date: 12/31/2017 CLINICAL DATA:  Shortness of breath for 3 weeks. EXAM: CHEST - 2 VIEW COMPARISON:  01/06/2017 FINDINGS: There is hazy airspace disease in the right and left lower lobes concerning for pneumonia. There is no pleural effusion or pneumothorax. The heart and mediastinal contours are unremarkable. The osseous structures are unremarkable. IMPRESSION: Hazy airspace disease in the right and left lower lobes concerning for pneumonia. Electronically Signed   By: Kathreen Devoid   On: 12/31/2017 13:13   Ct Angio Chest W/cm &/or Wo Cm  Result Date: 01/12/2018 CLINICAL DATA:  Cough with dyspnea and wheezing. New diagnosis of prostate cancer.  Abdominal pain. EXAM: CT ANGIOGRAPHY CHEST CT ABDOMEN AND PELVIS WITH  CONTRAST TECHNIQUE: Multidetector CT imaging of the chest was performed using the standard protocol during bolus administration of intravenous contrast. Multiplanar CT image reconstructions and MIPs were obtained to evaluate the vascular anatomy. Multidetector CT imaging of the abdomen and pelvis was performed using the standard protocol during bolus administration of intravenous contrast. CONTRAST:  11mL ISOVUE-370 IOPAMIDOL (ISOVUE-370) INJECTION 76% COMPARISON:  Pelvic CT 10/25/2017. FINDINGS: CTA CHEST FINDINGS Cardiovascular: Heart size upper normal. Coronary artery calcification is evident. Atherosclerotic calcification is noted in the wall of the thoracic aorta. Right main pulmonary artery measures 3.3 cm diameter. Left main pulmonary artery measures up to 2.7 cm diameter. Pulmonary arteries are well opacified. No right-sided filling defects to suggest acute pulmonary embolus. Patient does have a single small focus of segmental to subsegmental pulmonary embolus in the left upper lobe (image 142/series 11). Potential 3-4 mm embolic focus identified in distal pulmonary artery to the lingula (image 203/series 11). No other left-sided pulmonary embolic disease. Mediastinum/Nodes: Upper normal to mild mediastinal lymphadenopathy evident. 11 mm short axis right paratracheal node is partially obscured by beam hardening artifact from contrast in the SVC. The the the 14 mm AP window lymph node is visible on 45/4. Small lymph nodes evident in each hilum with 12 mm short axis inferior right hilar lymph node evident. Probable irregular non circumferential wall thickening in the distal esophagus (57/4) There is no axillary lymphadenopathy. Lungs/Pleura: The central tracheobronchial airways are patent. Basilar predominant interstitial opacity noted with subpleural reticulation, alveolar ground-glass opacity, and diffuse bronchiectasis. Areas of  peripheral patchy ground-glass attenuation are seen in the upper lungs bilaterally. This ground-glass attenuation is associated with bronchiectasis. No pleural effusion. Musculoskeletal: No worrisome lytic or sclerotic osseous abnormality. Patient is status post left shoulder replacement. Review of the MIP images confirms the above findings. CT ABDOMEN and PELVIS FINDINGS Hepatobiliary: No focal abnormality within the liver parenchyma. Gallbladder is distended. No intrahepatic or extrahepatic biliary dilation. Pancreas: No focal mass lesion. No dilatation of the main duct. No intraparenchymal cyst. No peripancreatic edema. Spleen: Wedge-shaped area of decreased perfusion in the the peripheral spleen suggests infarct, age indeterminate. Adrenals/Urinary Tract: No adrenal nodule or mass. 4 cm cyst identified lower pole right kidney. 8 mm well-defined low-density lesion in the lower interpolar region of the left kidney is too small to characterize but likely a cyst. No evidence for hydroureter. The urinary bladder appears normal for the degree of distention. Stomach/Bowel: Status post gastric bypass. Duodenum is normally positioned as is the ligament of Treitz. No small bowel wall thickening. No small bowel dilatation. The terminal ileum is normal. The appendix is not visualized, but there is no edema or inflammation in the region of the cecum. Diverticular changes are noted in the left colon without evidence of diverticulitis. Vascular/Lymphatic: There is abdominal aortic atherosclerosis without aneurysm. There is no gastrohepatic or hepatoduodenal ligament lymphadenopathy. No intraperitoneal or retroperitoneal lymphadenopathy. No pelvic sidewall lymphadenopathy. Reproductive: Prostate gland unremarkable. Penile prosthesis evident with irregular contour of the reservoir. Reservoir rupture not excluded. Other: No intraperitoneal free fluid. Musculoskeletal: Bilateral SI joint effusion evident. Lower lumbar fusion  hardware associated. Patient is status post vertebral augmentation at L1. Review of the MIP images confirms the above findings. IMPRESSION: 1. Tiny segmental to subsegmental pulmonary embolus in the left upper lobe with potential miniscule focus of embolism in a distal pulmonary artery to the lingula. 2. Advanced interstitial, basilar predominant fibrotic lung disease. Scattered areas of ground-glass attenuation are associated with bronchiectasis. 3. Enlargement of the main pulmonary  arteries suggests pulmonary arterial hypertension. 4. Asymmetric irregular wall thickening noted distal esophagus. Distal esophageal neoplasm a concern. 5. Mild mediastinal and hilar lymphadenopathy, possibly reactive. In the setting of neoplasm, metastatic disease would be a concern. 6. 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 7. Status post gastric bypass. These results will be called to the ordering clinician or representative by the Radiologist Assistant, and communication documented in the PACS or zVision Dashboard. Electronically Signed   By: Misty Stanley M.D.   On: 01/12/2018 12:25   Ct Abdomen Pelvis W Contrast  Result Date: 01/12/2018 CLINICAL DATA:  Cough with dyspnea and wheezing. New diagnosis of prostate cancer. Abdominal pain. EXAM: CT ANGIOGRAPHY CHEST CT ABDOMEN AND PELVIS WITH CONTRAST TECHNIQUE: Multidetector CT imaging of the chest was performed using the standard protocol during bolus administration of intravenous contrast. Multiplanar CT image reconstructions and MIPs were obtained to evaluate the vascular anatomy. Multidetector CT imaging of the abdomen and pelvis was performed using the standard protocol during bolus administration of intravenous contrast. CONTRAST:  176mL ISOVUE-370 IOPAMIDOL (ISOVUE-370) INJECTION 76% COMPARISON:  Pelvic CT 10/25/2017. FINDINGS: CTA CHEST FINDINGS Cardiovascular: Heart size upper normal. Coronary artery calcification  is evident. Atherosclerotic calcification is noted in the wall of the thoracic aorta. Right main pulmonary artery measures 3.3 cm diameter. Left main pulmonary artery measures up to 2.7 cm diameter. Pulmonary arteries are well opacified. No right-sided filling defects to suggest acute pulmonary embolus. Patient does have a single small focus of segmental to subsegmental pulmonary embolus in the left upper lobe (image 142/series 11). Potential 3-4 mm embolic focus identified in distal pulmonary artery to the lingula (image 203/series 11). No other left-sided pulmonary embolic disease. Mediastinum/Nodes: Upper normal to mild mediastinal lymphadenopathy evident. 11 mm short axis right paratracheal node is partially obscured by beam hardening artifact from contrast in the SVC. The the the 14 mm AP window lymph node is visible on 45/4. Small lymph nodes evident in each hilum with 12 mm short axis inferior right hilar lymph node evident. Probable irregular non circumferential wall thickening in the distal esophagus (57/4) There is no axillary lymphadenopathy. Lungs/Pleura: The central tracheobronchial airways are patent. Basilar predominant interstitial opacity noted with subpleural reticulation, alveolar ground-glass opacity, and diffuse bronchiectasis. Areas of peripheral patchy ground-glass attenuation are seen in the upper lungs bilaterally. This ground-glass attenuation is associated with bronchiectasis. No pleural effusion. Musculoskeletal: No worrisome lytic or sclerotic osseous abnormality. Patient is status post left shoulder replacement. Review of the MIP images confirms the above findings. CT ABDOMEN and PELVIS FINDINGS Hepatobiliary: No focal abnormality within the liver parenchyma. Gallbladder is distended. No intrahepatic or extrahepatic biliary dilation. Pancreas: No focal mass lesion. No dilatation of the main duct. No intraparenchymal cyst. No peripancreatic edema. Spleen: Wedge-shaped area of decreased  perfusion in the the peripheral spleen suggests infarct, age indeterminate. Adrenals/Urinary Tract: No adrenal nodule or mass. 4 cm cyst identified lower pole right kidney. 8 mm well-defined low-density lesion in the lower interpolar region of the left kidney is too small to characterize but likely a cyst. No evidence for hydroureter. The urinary bladder appears normal for the degree of distention. Stomach/Bowel: Status post gastric bypass. Duodenum is normally positioned as is the ligament of Treitz. No small bowel wall thickening. No small bowel dilatation. The terminal ileum is normal. The appendix is not visualized, but there is no edema or inflammation in the region of the cecum. Diverticular changes are noted in the left colon  without evidence of diverticulitis. Vascular/Lymphatic: There is abdominal aortic atherosclerosis without aneurysm. There is no gastrohepatic or hepatoduodenal ligament lymphadenopathy. No intraperitoneal or retroperitoneal lymphadenopathy. No pelvic sidewall lymphadenopathy. Reproductive: Prostate gland unremarkable. Penile prosthesis evident with irregular contour of the reservoir. Reservoir rupture not excluded. Other: No intraperitoneal free fluid. Musculoskeletal: Bilateral SI joint effusion evident. Lower lumbar fusion hardware associated. Patient is status post vertebral augmentation at L1. Review of the MIP images confirms the above findings. IMPRESSION: 1. Tiny segmental to subsegmental pulmonary embolus in the left upper lobe with potential miniscule focus of embolism in a distal pulmonary artery to the lingula. 2. Advanced interstitial, basilar predominant fibrotic lung disease. Scattered areas of ground-glass attenuation are associated with bronchiectasis. 3. Enlargement of the main pulmonary arteries suggests pulmonary arterial hypertension. 4. Asymmetric irregular wall thickening noted distal esophagus. Distal esophageal neoplasm a concern. 5. Mild mediastinal and hilar  lymphadenopathy, possibly reactive. In the setting of neoplasm, metastatic disease would be a concern. 6. 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 7. Status post gastric bypass. These results will be called to the ordering clinician or representative by the Radiologist Assistant, and communication documented in the PACS or zVision Dashboard. Electronically Signed   By: Misty Stanley M.D.   On: 01/12/2018 12:25   Dg Chest Port 1 View  Addendum Date: 01/14/2018   ADDENDUM REPORT: 01/14/2018 06:35 ADDENDUM: The sentence should read focal density is now seen in the bases bilaterally Electronically Signed   By: Inez Catalina M.D.   On: 01/14/2018 06:35   Result Date: 01/14/2018 CLINICAL DATA:  Shortness of breath EXAM: PORTABLE CHEST 1 VIEW COMPARISON:  01/12/2018 FINDINGS: Cardiac shadow is stable. Aortic calcifications are again noted. The lungs are again well aerated with diffuse fibrotic changes similar to that seen on the prior CT examination. Some increased focal density is noted in the bases bilaterally likely representing acute superimposed infiltrate. No effusion or pneumothorax is seen. Postsurgical changes in the left shoulder are noted. IMPRESSION: Acute on chronic basilar infiltrates with underlying fibrotic change. Electronically Signed: By: Inez Catalina M.D. On: 01/14/2018 06:32   Vas [place] Lower Extremity Venous (dvt) (only [facility])  Result Date: 01/12/2018  Lower Venous Study Indications: Pulmonary embolism.  Performing Technologist: Oliver Hum RVT  Examination Guidelines: A complete evaluation includes B-mode imaging, spectral Doppler, color Doppler, and power Doppler as needed of all accessible portions of each vessel. Bilateral testing is considered an integral part of a complete examination. Limited examinations for reoccurring indications may be performed as noted. The reflux portion of the exam is performed with the patient in  reverse Trendelenburg.  Right Venous Findings: +---------+---------------+---------+-----------+----------+-------+          CompressibilityPhasicitySpontaneityPropertiesSummary +---------+---------------+---------+-----------+----------+-------+ CFV      Full           Yes      Yes                          +---------+---------------+---------+-----------+----------+-------+ SFJ      Full                                                 +---------+---------------+---------+-----------+----------+-------+ FV Prox  Full                                                 +---------+---------------+---------+-----------+----------+-------+  FV Mid   Full                                                 +---------+---------------+---------+-----------+----------+-------+ FV DistalFull                                                 +---------+---------------+---------+-----------+----------+-------+ PFV      Full                                                 +---------+---------------+---------+-----------+----------+-------+ POP      Full           Yes      Yes                          +---------+---------------+---------+-----------+----------+-------+ PTV      Full                                                 +---------+---------------+---------+-----------+----------+-------+ PERO     Full                                                 +---------+---------------+---------+-----------+----------+-------+  Left Venous Findings: +---------+---------------+---------+-----------+----------+-------+          CompressibilityPhasicitySpontaneityPropertiesSummary +---------+---------------+---------+-----------+----------+-------+ CFV      Full           Yes      Yes                          +---------+---------------+---------+-----------+----------+-------+ SFJ      Full                                                  +---------+---------------+---------+-----------+----------+-------+ FV Prox  Full                                                 +---------+---------------+---------+-----------+----------+-------+ FV Mid   Full                                                 +---------+---------------+---------+-----------+----------+-------+ FV DistalFull                                                 +---------+---------------+---------+-----------+----------+-------+  PFV      Full                                                 +---------+---------------+---------+-----------+----------+-------+ POP      Full           Yes      Yes                          +---------+---------------+---------+-----------+----------+-------+ PTV      Full                                                 +---------+---------------+---------+-----------+----------+-------+ PERO     Full                                                 +---------+---------------+---------+-----------+----------+-------+    Summary: Right: There is no evidence of deep vein thrombosis in the lower extremity. No cystic structure found in the popliteal fossa. Left: There is no evidence of deep vein thrombosis in the lower extremity. No cystic structure found in the popliteal fossa.  *See table(s) above for measurements and observations. Electronically signed by Quay Burow MD on 01/12/2018 at 4:45:11 PM.    Final     ECHOCARDIOGRAM ------------------------------------------------------------------- Study Conclusions  - Left ventricle: The cavity size was normal. There was mild concentric hypertrophy. Systolic function was normal. The estimated ejection fraction was in the range of 55% to 60%. Wall motion was normal; there were no regional wall motion abnormalities. The study was not technically sufficient to allow evaluation of LV diastolic dysfunction due to atrial fibrillation. - Aortic  valve: Transvalvular velocity was minimally increased. There was no stenosis. There was mild regurgitation. - Mitral valve: There was mild regurgitation. - Left atrium: The atrium was severely dilated. - Right ventricle: Systolic function was normal. - Right atrium: The atrium was moderately dilated. - Tricuspid valve: There was mild regurgitation. - Pulmonic valve: There was no regurgitation. - Pulmonary arteries: Systolic pressure was mildly increased. PA peak pressure: 38 mm Hg (S). - Inferior vena cava: The vessel was normal in size. - Pericardium, extracardiac: There was no pericardial effusion.  Subjective: Seen and examined states that he felt better that his breathing is much improved.  Denies any chest pain, lightheadedness or dizziness.  Still takes a little while to recover once he ambulates but states his shortness of breath is not as bad and feels much better on the steroids but no other concerns at present this time pulmonary is advised discharging patient is to follow-up with them within 1 week.  Patient understands plan of care and I discussed with him about his anticoagulation and he did elect to go on Eliquis at the recommendation of cardiology and hematology and he will start Eliquis once his INR is less than 2.  Discharge Exam: Vitals:   01/15/18 0713 01/15/18 1341  BP:  113/70  Pulse:  91  Resp:  16  Temp:  97.6 F (36.4 C)  SpO2: 95% 96%   Vitals:   01/14/18 2100  01/15/18 0400 01/15/18 0713 01/15/18 1341  BP: 106/60 112/72  113/70  Pulse: 100 81  91  Resp: 16 14  16   Temp: 97.7 F (36.5 C) 97.7 F (36.5 C)  97.6 F (36.4 C)  TempSrc: Oral Oral  Oral  SpO2: 97% 97% 95% 96%  Weight:      Height:       General: Pt is alert, awake, not in acute distress Cardiovascular: Tachycardic and mildly irregular, S1/S2 +, no rubs, no gallops Respiratory: Diminished bilaterally with faint wheezing, no rhonchi; labored breathing but is on supplemental oxygen via  nasal cannula Abdominal: Soft, NT, ND, bowel sounds + Extremities: no extremity edema, no cyanosis  The results of significant diagnostics from this hospitalization (including imaging, microbiology, ancillary and laboratory) are listed below for reference.    Microbiology: No results found for this or any previous visit (from the past 240 hour(s)).   Labs: BNP (last 3 results) No results for input(s): BNP in the last 8760 hours. Basic Metabolic Panel: Recent Labs  Lab 01/10/18 1657 01/12/18 1508 01/13/18 0351 01/14/18 0440 01/15/18 0514  NA 134* 135 135 132* 137  K 4.2 4.6 4.4 4.2 4.5  CL 99 100 101 100 103  CO2 24 26 21* 22 26  GLUCOSE 191* 140* 204* 307* 205*  BUN 22 26* 27* 33* 36*  CREATININE 1.40 1.46* 1.40* 1.43* 1.47*  CALCIUM 8.7 8.3* 8.2* 8.2* 8.3*  MG  --   --   --  2.2 2.4  PHOS  --   --   --  3.2 3.0   Liver Function Tests: Recent Labs  Lab 01/10/18 1657 01/12/18 1508 01/14/18 0440 01/15/18 0514  AST 20 24 25 21   ALT 13 14 17 17   ALKPHOS 93 74 79 79  BILITOT 0.4 0.5 0.4 0.5  PROT 7.0 6.7 6.4* 6.0*  ALBUMIN 3.6 3.0* 2.9* 2.8*   No results for input(s): LIPASE, AMYLASE in the last 168 hours. No results for input(s): AMMONIA in the last 168 hours. CBC: Recent Labs  Lab 01/10/18 1657 01/12/18 1508 01/13/18 0351 01/14/18 0440 01/15/18 0514  WBC 13.8* 9.7 7.8 17.6* 14.0*  NEUTROABS 10.7* 6.4  --  15.9* 11.0*  HGB 13.1 11.2* 11.4* 11.3* 10.7*  HCT 39.8 35.6* 35.3* 35.0* 34.0*  MCV 90.9 93.2 91.5 93.6 95.2  PLT 340.0 309 280 277 293   Cardiac Enzymes: No results for input(s): CKTOTAL, CKMB, CKMBINDEX, TROPONINI in the last 168 hours. BNP: Invalid input(s): POCBNP CBG: Recent Labs  Lab 01/14/18 1216 01/14/18 1734 01/14/18 2109 01/15/18 0731 01/15/18 1125  GLUCAP 262* 278* 195* 186* 173*   D-Dimer No results for input(s): DDIMER in the last 72 hours. Hgb A1c No results for input(s): HGBA1C in the last 72 hours. Lipid Profile No  results for input(s): CHOL, HDL, LDLCALC, TRIG, CHOLHDL, LDLDIRECT in the last 72 hours. Thyroid function studies No results for input(s): TSH, T4TOTAL, T3FREE, THYROIDAB in the last 72 hours.  Invalid input(s): FREET3 Anemia work up No results for input(s): VITAMINB12, FOLATE, FERRITIN, TIBC, IRON, RETICCTPCT in the last 72 hours. Urinalysis    Component Value Date/Time   COLORURINE YELLOW 06/03/2017 1402   APPEARANCEUR CLEAR 06/03/2017 1402   LABSPEC 1.013 06/03/2017 1402   PHURINE 5.0 06/03/2017 1402   GLUCOSEU NEGATIVE 06/03/2017 1402   HGBUR NEGATIVE 06/03/2017 1402   HGBUR small 11/05/2009 1419   BILIRUBINUR NEGATIVE 06/03/2017 [place] 06/03/2017 [place] 06/03/2017 1402   UROBILINOGEN  0.2 01/19/2013 0930   NITRITE NEGATIVE 06/03/2017 1402   LEUKOCYTESUR TRACE (A) 06/03/2017 1402   Sepsis Labs Invalid input(s): PROCALCITONIN,  WBC,  LACTICIDVEN Microbiology No results found for this or any previous visit (from the past 240 hour(s)).  Time coordinating discharge: 35 minutes  SIGNED:  Kerney Elbe, DO [facility] 01/16/2018, 9:04 PM Pager is on [place]  If 7PM-7AM, please contact night-coverage [email or web address] Password [email or web address]

## 2018-01-17 ENCOUNTER — Ambulatory Visit (INDEPENDENT_AMBULATORY_CARE_PROVIDER_SITE_OTHER): Payer: Medicare Other | Admitting: Primary Care

## 2018-01-17 ENCOUNTER — Encounter: Payer: Self-pay | Admitting: Primary Care

## 2018-01-17 ENCOUNTER — Telehealth: Payer: Self-pay | Admitting: Emergency Medicine

## 2018-01-17 ENCOUNTER — Telehealth: Payer: Self-pay | Admitting: Primary Care

## 2018-01-17 VITALS — BP 98/60 | HR 100 | Temp 97.5°F | Ht 72.0 in

## 2018-01-17 DIAGNOSIS — J8410 Pulmonary fibrosis, unspecified: Secondary | ICD-10-CM

## 2018-01-17 DIAGNOSIS — J441 Chronic obstructive pulmonary disease with (acute) exacerbation: Secondary | ICD-10-CM | POA: Diagnosis not present

## 2018-01-17 DIAGNOSIS — Z7901 Long term (current) use of anticoagulants: Secondary | ICD-10-CM | POA: Diagnosis not present

## 2018-01-17 DIAGNOSIS — C61 Malignant neoplasm of prostate: Secondary | ICD-10-CM

## 2018-01-17 DIAGNOSIS — R918 Other nonspecific abnormal finding of lung field: Secondary | ICD-10-CM | POA: Diagnosis not present

## 2018-01-17 MED ORDER — IPRATROPIUM-ALBUTEROL 0.5-2.5 (3) MG/3ML IN SOLN: 3.0000 mL | Freq: Four times a day (QID) | RESPIRATORY_TRACT | 0 refills | Status: DC | PRN

## 2018-01-17 MED ORDER — PREDNISONE 10 MG PO TABS: ORAL_TABLET | ORAL | 0 refills | Status: DC

## 2018-01-17 NOTE — Patient Instructions (Addendum)
Prednisone taper (40mg  x 4 days; 30mg  x 4 days, 20mg  x 4 days, 10mg  x 4 days)  Titrate oxygen 2-4 L as needed to keep O2 >90%  Needs PFTs in 2-4 weeks   Labs this week, RHEUMATOID FACTOR   Home physical therapy eval  (not [facility])  Recommend pulmonary rehab re: pulmonary fibrosis   Next available IL[facility] clinic asap (Dr. Chase Caller or [facility])

## 2018-01-17 NOTE — Assessment & Plan Note (Signed)
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 Dr. Maylon Peppers recommended therapeutic Lovenox but patient hesitant about life long injections. Ultimately patient was started on Eliquis, however, patient unable afford and resumed coumadin. Will reach out to hematology for input.

## 2018-01-17 NOTE — Assessment & Plan Note (Addendum)
-   CTA 12/4 showed advanced interstitial, basilar predominant fibrotic lung disease, scattered areas of groundglass attenuation are associated with bronchiectasis - UIP pattern consistent with IPF. No alternative diagnosis found. Symptoms felt to be related to disease progression and not flare - Continue home oxygen 2-4 L keep O2 >90% - RX Prednisone taper per discharge summary - Needs pulmonary rehab and PT eval - Started discussion for possible treatment with anti-fibrotic medication (OFEV), patient/family seem interested would need patient assistance  - Needs full PFTs if able - Needs rheumatoid factor (ANA and CCP completed) - Refer to ILD clinic, Dr. Ramswamy/Dr. Vaughan Browner first available

## 2018-01-17 NOTE — Assessment & Plan Note (Signed)
-   Scheduled for radiation therapy for prostate cancer in January.  IPF would not be a contraindication to continue this

## 2018-01-17 NOTE — Assessment & Plan Note (Addendum)
-   Continue nebulizer three times a day

## 2018-01-17 NOTE — Assessment & Plan Note (Addendum)
-   As discussed above. In addition, asymmetric irregular wall thickening noted distal esophagus. Distal esophageal neoplasm a concern - Needs GI referral

## 2018-01-17 NOTE — Progress Notes (Signed)
@Patient  ID: [name], male    DOB: [birth year], 77 y.o.   MRN: [id number]  Chief Complaint  Patient presents with  . Follow-up    SOB all the time, chest tightness and wheezing, weak    Referring provider: Jinny Sanders, MD  HPI: 77 year old male, never smoked. 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 Patient of Dr. Lamonte Sakai, initial eval 01/10/18 for dyspnea and cough. PFTs in 2013 showed mild evidence of obstruction with normal lung volumes.    12/2/19Ok [place], [place]  Patient carried a dx of COPD however patient is non-smoker and pulmonary function testing from 2013 shows mild evidence of obstruction. He has become more dyspneic since the end of October 2019. Continued cough, mostly dry. Reported weight loss and +diarrhea. CXR from 12/31/17 showed bilateral base predominant interstitial opacities. Treated with azithromycin and then Levaquin. Instructed to take albuterol nebulizer three times a day. Ordered for labs and CTA. Needs repeat pulmonary function testing when able to complete.   Recent [place] encounters: 12/4/19[name] 77 year old male patient presenting today for follow-up visit after completing CT angios and CT abdomen with contrast.  Patient presents with his daughter.  Patient continues to have worsening dyspnea, shortness of breath, weight loss.  Diarrhea has slightly improved.  Patient continues to have dry cough.  Patient denies hemoptysis, blood in urine or stool.  Patient reports increased fatigue. CT Angio completed prior to office visit today showing pulmonary embolism and questionable distal esophageal neoplasm that needs further evaluation. Of note patient has become considerably more fatigued.  Patient has fallen twice today due to exhaustion.  Patient's daughter is present today with him she is very concerned regarding  his symptoms and reports that this is the worst he is looked in a while  Hospital admission: Patient admitted from pulmonary office on 01/12/18 d/t abnormal CTA findings. CTA 12/4 showed tiny subsegmental pulmonary embolus in the left upper lobe and may be in the distal lingula. Additional findings significant for advanced interstitial, basilar predominant fibrotic lung diease and scattered areas of ground-glass attenuation and traction bronchiectasis. Dr. Elsworth Soho also noted some honeycombing which is indicative of UIP pattern. No other alternative diagnosis. 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  Started discussion for anti-fibrotic medication with patient.   Echocardiogram showed EF 55-60%, severely dilated LA and right ventricle systolic function was normal. PA peak pressure 41mm Hg. Ultrasound of lower extremities showed no evidence of DVT. Hematology consulted for new PE on Warfarin and feels that the LUL PE is very small and unlikely responsible for acute worsening of dyspnea. Dr. Maylon Peppers recommended therapeutic Lovenox but patient hesitant about life long injections. Alternative options include treatment with Eliquis.   01/17/2018 Patient presents for hospital follow up. Accompanied by his wife, she appears very frustrated with disease course, lack of answers/information and recent hospitalization. He was discharged home 2 days ago on Saturday 01/15/18. New to oxygen, using 2L currently. States that prednisone was the only thing that helped. Discharge summary says patient was discharged with prednisone taper, however, patients wife states that they were not sent home with any new medication. Breathing is the same, still has a dry cough (rarely productive). Cough medication makes him sleepy. Has tessalon perles that he takes as needed. He is weak, not receiving physical therapy and has not been  set up with pulmonary rehab. Back on coumadin since  yesterday. Insurance did not cover recommended Eliquis.   Tests:  01/12/2018-CT Angio- tiny segmental to subsegmental pulmonary emboli in left upper lobe with potential minuscule focus of embolism and distal pulmonary artery to the lingula, advanced interstitial, basilar predominant fibrotic lung disease, scattered areas of groundglass attenuation are associated with bronchiectasis, enlarged pulmonary arteries suggesting pulmonary arterial hypertension, asymmetric irregular wall thickening noted distal esophagus distal esophageal neoplasm is a concern, mild mediastinal and hilar lymphadenopathy possibly reactive setting a neoplasm metastatic disease would be a concern, status post gastric bypass   Allergies  Allergen Reactions  . Sulfacetamide Sodium Swelling    throat swelling  . Latex Itching    Severe contact dermatitis  . Adhesive [Tape] Other (See Comments)    Tears skin-- "No tape of any kind, they all tear skin right off"    Immunization History  Administered Date(s) Administered  . Influenza Whole 11/09/2005, 11/09/2008, 11/10/2010  . Influenza, High Dose Seasonal PF 12/08/2013, 11/02/2015, 11/09/2016, 12/20/2017  . Influenza-Unspecified 11/29/2014  . Pneumococcal Conjugate-13 12/22/2013  . Pneumococcal Polysaccharide-23 02/10/2003, 11/09/2008, 11/02/2015  . Td 02/09/2005  . Tdap 06/06/2015  . Zoster 06/21/2015    Past Medical History:  Diagnosis Date  . Arthritis    hands   . Asthma   . Chronic atrial fibrillation    a.fib on warfarin- Dr. Percival Spanish follows  . Diverticulosis of colon (without mention of hemorrhage)   . Dysrhythmia    a fib  . ED (erectile dysfunction)   . Fibromyalgia   . GERD (gastroesophageal reflux disease)   . Hard of hearing    wears hearing aids  . Headache   . History of blood clots    behind right knee and then went into left lung 53yrs ago  . History of colon polyps   . History of kidney stones   . Hx of diabetes mellitus    "no longer  diabetic since gastric bypass" - no longer taking metformin"  in 2 months "problems with low blood sugar now"  . Hx of gout    but doesn't take any meds  . Inflammation of colonic mucosa    recent admission and release from [facility] [place] 02-28-15-remains on oral antibiotic  . Obesity   . Peripheral vascular disease ([place])   . Pneumonia    last time about 42yrs ago  . Prostate cancer ([place])   . Pulmonary embolism ([place])    over 10 yrs ago "many years ago"  . Restless legs syndrome (RLS)   . Skin cancer   . Unspecified asthma(493.90)    as a child  . Unspecified essential hypertension    hx of no longer on medication due to gastric bypass     Tobacco History: Social History   Tobacco Use  Smoking Status Never Smoker  Smokeless Tobacco Never Used   Counseling given: Not Answered   Outpatient Medications Prior to Visit  Medication Sig Dispense Refill  . acarbose (PRECOSE) 50 MG tablet Take 50 mg by mouth 3 (three) times daily with meals.    [name] atorvastatin (LIPITOR) 80 MG tablet Take 1 tablet (80 mg total) by mouth at bedtime. 90 tablet 3  . benzonatate (TESSALON) 200 MG capsule Take 1 capsule (200 mg total) by mouth 3 (three) times daily as needed for cough. 20 capsule 0  . chlorpheniramine-HYDROcodone (TUSSIONEX) 10-8 MG/5ML SUER Take 5 mLs by mouth every 12 (twelve) hours as needed for cough. (Patient taking differently:  Take 1.5 mLs by mouth every 12 (twelve) hours as needed for cough. ) 140 mL 0  . FLUoxetine (PROZAC) 20 MG capsule TAKE 3 CAPSULES BY MOUTH EVERYDAY AT BEDTIME (Patient taking differently: Take 60 mg by mouth at bedtime. ) 270 capsule 1  . Fluticasone-Salmeterol (ADVAIR DISKUS) 250-50 MCG/DOSE AEPB Inhale 1 puff into the lungs 2 (two) times daily. (Patient taking differently: Inhale 1 puff into the lungs 2 (two) times daily as needed (shortness of breath). ) 1 each 11  . furosemide (LASIX) 20 MG tablet Take 1 tablet (20 mg total) by mouth every other day. (Patient taking  differently: Take 20 mg by mouth daily. ) 180 tablet 1  . gabapentin (NEURONTIN) 300 MG capsule TAKE 1 CAPSULE BY MOUTH TWICE A DAY (Patient taking differently: Take 300 mg by mouth 2 (two) times daily. ) 180 capsule 1  . hydrocortisone 2.5 % cream Apply 1 application topically 2 (two) times daily as needed (itching).   2  . hydroxypropyl methylcellulose / hypromellose (ISOPTO TEARS / GONIOVISC) 2.5 % ophthalmic solution Place 1 drop into both eyes 2 (two) times daily as needed for dry eyes.    . Omega-3 Fatty Acids (FISH OIL) 1000 MG CAPS Take 1,000 mg by mouth daily.    [name] omeprazole (PRILOSEC) 40 MG capsule TAKE 1 CAPSULE (40 MG TOTAL) BY MOUTH DAILY. (Patient taking differently: Take 40 mg by mouth at bedtime. ) 90 capsule 1  . PRESCRIPTION MEDICATION Apply 1 g topically 4 (four) times daily as needed (pain). Baclofen/gabapentin/lidocaine compounded cream. Applies 1g (1pump) to affected area up to four times a day for    . primidone (MYSOLINE) 50 MG tablet 150 mg in the morning, 100 mg at night (Patient taking differently: Take 100-150 mg by mouth See admin instructions. 150 mg in the morning, 100 mg at night) 450 tablet 1  . ranitidine (ZANTAC) 300 MG tablet Take 300 mg by mouth at bedtime.   3  . sucralfate (CARAFATE) 1 g tablet Take 1 g by mouth 3 (three) times daily before meals.   6  . tamsulosin (FLOMAX) 0.4 MG CAPS capsule Take 0.4 mg by mouth at bedtime.     [name] tiZANidine (ZANAFLEX) 4 MG tablet Take 4 mg by mouth every 6 (six) hours as needed for muscle spasms.     . vitamin E 400 UNIT capsule Take 400 Units by mouth daily.    [name] zolpidem (AMBIEN CR) 12.5 MG CR tablet Take 12.5 mg by mouth at bedtime.    [name] apixaban (ELIQUIS) 5 MG TABS tablet Take 10 (two 5 mg Tablets) BID x 7 days, and then 5 mg po BID 70 tablet 0  . ipratropium-albuterol (DUONEB) 0.5-2.5 (3) MG/3ML SOLN Take 3 mLs by nebulization every 4 (four) hours as needed. (Patient taking differently: Take 3 mLs by nebulization 3  (three) times daily. ) 360 mL 0   No facility-administered medications prior to visit.     Review of Systems  Review of Systems  Constitutional: Positive for fatigue and unexpected weight change.  Respiratory: Positive for cough and shortness of breath. Negative for apnea, choking, chest tightness, wheezing and stridor.   Cardiovascular: Negative.   Gastrointestinal: Negative.     Physical Exam  BP 98/60 (BP Location: Right Arm, Cuff Size: Normal)   Pulse 100   Temp (!) 97.5 F (36.4 C)   Ht 6' (1.829 m)   SpO2 91%   BMI 24.01 kg/m  Physical Exam  Constitutional: He is  oriented to person, place, and time. He appears well-developed and well-nourished. No distress.  Thin adult male. Appears weak and fatigued  HENT:  Head: Normocephalic and atraumatic.  Eyes: Pupils are equal, round, and reactive to light. EOM are normal.  Neck: Normal range of motion. Neck supple.  Cardiovascular: Normal rate and regular rhythm.  Pulmonary/Chest: Effort normal. No stridor. No respiratory distress. He has no wheezes.  Mostly CTA, mildly diminished ?crackles bases  91% 2L   Musculoskeletal: Normal range of motion.  In [facility], gait not assessed  Neurological: He is alert and oriented to person, place, and time.  Skin: Skin is warm and dry.  ?clubbing of nails  Psychiatric: His behavior is normal. Thought content normal.     Lab Results:  CBC    Component Value Date/Time   WBC 14.0 (H) 01/15/2018 0514   RBC 3.57 (L) 01/15/2018 0514   HGB 10.7 (L) 01/15/2018 0514   HCT 34.0 (L) 01/15/2018 0514   PLT 293 01/15/2018 0514   MCV 95.2 01/15/2018 0514   MCH 30.0 01/15/2018 0514   MCHC 31.5 01/15/2018 0514   RDW 13.2 01/15/2018 0514   LYMPHSABS 1.4 01/15/2018 0514   MONOABS 1.3 (H) 01/15/2018 0514   EOSABS 0.1 01/15/2018 0514   BASOSABS 0.0 01/15/2018 0514    BMET    Component Value Date/Time   NA 137 01/15/2018 0514   K 4.5 01/15/2018 0514   CL 103 01/15/2018 0514   CO2 26  01/15/2018 0514   GLUCOSE 205 (H) 01/15/2018 0514   BUN 36 (H) 01/15/2018 0514   CREATININE 1.47 (H) 01/15/2018 0514   CALCIUM 8.3 (L) 01/15/2018 0514   GFRNONAA 46 (L) 01/15/2018 0514   GFRAA 53 (L) 01/15/2018 0514    BNP No results found for: BNP  ProBNP    Component Value Date/Time   PROBNP 123.0 (H) 01/10/2018 1657    Imaging: Dg Chest 2 View  Result Date: 12/31/2017 CLINICAL DATA:  Shortness of breath for 3 weeks. EXAM: CHEST - 2 VIEW COMPARISON:  01/06/2017 FINDINGS: There is hazy airspace disease in the right and left lower lobes concerning for pneumonia. There is no pleural effusion or pneumothorax. The heart and mediastinal contours are unremarkable. The osseous structures are unremarkable. IMPRESSION: Hazy airspace disease in the right and left lower lobes concerning for pneumonia. Electronically Signed   By: Kathreen Devoid   On: 12/31/2017 13:13   Ct Angio Chest W/cm &/or Wo Cm  Result Date: 01/12/2018 CLINICAL DATA:  Cough with dyspnea and wheezing. New diagnosis of prostate cancer. Abdominal pain. EXAM: CT ANGIOGRAPHY CHEST CT ABDOMEN AND PELVIS WITH CONTRAST TECHNIQUE: Multidetector CT imaging of the chest was performed using the standard protocol during bolus administration of intravenous contrast. Multiplanar CT image reconstructions and MIPs were obtained to evaluate the vascular anatomy. Multidetector CT imaging of the abdomen and pelvis was performed using the standard protocol during bolus administration of intravenous contrast. CONTRAST:  173mL ISOVUE-370 IOPAMIDOL (ISOVUE-370) INJECTION 76% COMPARISON:  Pelvic CT 10/25/2017. FINDINGS: CTA CHEST FINDINGS Cardiovascular: Heart size upper normal. Coronary artery calcification is evident. Atherosclerotic calcification is noted in the wall of the thoracic aorta. Right main pulmonary artery measures 3.3 cm diameter. Left main pulmonary artery measures up to 2.7 cm diameter. Pulmonary arteries are well opacified. No right-sided  filling defects to suggest acute pulmonary embolus. Patient does have a single small focus of segmental to subsegmental pulmonary embolus in the left upper lobe (image 142/series 11). Potential 3-4 mm embolic focus  identified in distal pulmonary artery to the lingula (image 203/series 11). No other left-sided pulmonary embolic disease. Mediastinum/Nodes: Upper normal to mild mediastinal lymphadenopathy evident. 11 mm short axis right paratracheal node is partially obscured by beam hardening artifact from contrast in the SVC. The the the 14 mm AP window lymph node is visible on 45/4. Small lymph nodes evident in each hilum with 12 mm short axis inferior right hilar lymph node evident. Probable irregular non circumferential wall thickening in the distal esophagus (57/4) There is no axillary lymphadenopathy. Lungs/Pleura: The central tracheobronchial airways are patent. Basilar predominant interstitial opacity noted with subpleural reticulation, alveolar ground-glass opacity, and diffuse bronchiectasis. Areas of peripheral patchy ground-glass attenuation are seen in the upper lungs bilaterally. This ground-glass attenuation is associated with bronchiectasis. No pleural effusion. Musculoskeletal: No worrisome lytic or sclerotic osseous abnormality. Patient is status post left shoulder replacement. Review of the MIP images confirms the above findings. CT ABDOMEN and PELVIS FINDINGS Hepatobiliary: No focal abnormality within the liver parenchyma. Gallbladder is distended. No intrahepatic or extrahepatic biliary dilation. Pancreas: No focal mass lesion. No dilatation of the main duct. No intraparenchymal cyst. No peripancreatic edema. Spleen: Wedge-shaped area of decreased perfusion in the the peripheral spleen suggests infarct, age indeterminate. Adrenals/Urinary Tract: No adrenal nodule or mass. 4 cm cyst identified lower pole right kidney. 8 mm well-defined low-density lesion in the lower interpolar region of the left  kidney is too small to characterize but likely a cyst. No evidence for hydroureter. The urinary bladder appears normal for the degree of distention. Stomach/Bowel: Status post gastric bypass. Duodenum is normally positioned as is the ligament of Treitz. No small bowel wall thickening. No small bowel dilatation. The terminal ileum is normal. The appendix is not visualized, but there is no edema or inflammation in the region of the cecum. Diverticular changes are noted in the left colon without evidence of diverticulitis. Vascular/Lymphatic: There is abdominal aortic atherosclerosis without aneurysm. There is no gastrohepatic or hepatoduodenal ligament lymphadenopathy. No intraperitoneal or retroperitoneal lymphadenopathy. No pelvic sidewall lymphadenopathy. Reproductive: Prostate gland unremarkable. Penile prosthesis evident with irregular contour of the reservoir. Reservoir rupture not excluded. Other: No intraperitoneal free fluid. Musculoskeletal: Bilateral SI joint effusion evident. Lower lumbar fusion hardware associated. Patient is status post vertebral augmentation at L1. Review of the MIP images confirms the above findings. IMPRESSION: 1. Tiny segmental to subsegmental pulmonary embolus in the left upper lobe with potential miniscule focus of embolism in a distal pulmonary artery to the lingula. 2. Advanced interstitial, basilar predominant fibrotic lung disease. Scattered areas of ground-glass attenuation are associated with bronchiectasis. 3. Enlargement of the main pulmonary arteries suggests pulmonary arterial hypertension. 4. Asymmetric irregular wall thickening noted distal esophagus. Distal esophageal neoplasm a concern. 5. Mild mediastinal and hilar lymphadenopathy, possibly reactive. In the setting of neoplasm, metastatic disease would be a concern. 6. 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 7. Status post gastric bypass. These results will be  called to the ordering clinician or representative by the Radiologist Assistant, and communication documented in the PACS or zVision Dashboard. Electronically Signed   By: Misty Stanley M.D.   On: 01/12/2018 12:25   Ct Abdomen Pelvis W Contrast  Result Date: 01/12/2018 CLINICAL DATA:  Cough with dyspnea and wheezing. New diagnosis of prostate cancer. Abdominal pain. EXAM: CT ANGIOGRAPHY CHEST CT ABDOMEN AND PELVIS WITH CONTRAST TECHNIQUE: Multidetector CT imaging of the chest was performed using the standard protocol during bolus administration of intravenous contrast.  Multiplanar CT image reconstructions and MIPs were obtained to evaluate the vascular anatomy. Multidetector CT imaging of the abdomen and pelvis was performed using the standard protocol during bolus administration of intravenous contrast. CONTRAST:  18mL ISOVUE-370 IOPAMIDOL (ISOVUE-370) INJECTION 76% COMPARISON:  Pelvic CT 10/25/2017. FINDINGS: CTA CHEST FINDINGS Cardiovascular: Heart size upper normal. Coronary artery calcification is evident. Atherosclerotic calcification is noted in the wall of the thoracic aorta. Right main pulmonary artery measures 3.3 cm diameter. Left main pulmonary artery measures up to 2.7 cm diameter. Pulmonary arteries are well opacified. No right-sided filling defects to suggest acute pulmonary embolus. Patient does have a single small focus of segmental to subsegmental pulmonary embolus in the left upper lobe (image 142/series 11). Potential 3-4 mm embolic focus identified in distal pulmonary artery to the lingula (image 203/series 11). No other left-sided pulmonary embolic disease. Mediastinum/Nodes: Upper normal to mild mediastinal lymphadenopathy evident. 11 mm short axis right paratracheal node is partially obscured by beam hardening artifact from contrast in the SVC. The the the 14 mm AP window lymph node is visible on 45/4. Small lymph nodes evident in each hilum with 12 mm short axis inferior right hilar  lymph node evident. Probable irregular non circumferential wall thickening in the distal esophagus (57/4) There is no axillary lymphadenopathy. Lungs/Pleura: The central tracheobronchial airways are patent. Basilar predominant interstitial opacity noted with subpleural reticulation, alveolar ground-glass opacity, and diffuse bronchiectasis. Areas of peripheral patchy ground-glass attenuation are seen in the upper lungs bilaterally. This ground-glass attenuation is associated with bronchiectasis. No pleural effusion. Musculoskeletal: No worrisome lytic or sclerotic osseous abnormality. Patient is status post left shoulder replacement. Review of the MIP images confirms the above findings. CT ABDOMEN and PELVIS FINDINGS Hepatobiliary: No focal abnormality within the liver parenchyma. Gallbladder is distended. No intrahepatic or extrahepatic biliary dilation. Pancreas: No focal mass lesion. No dilatation of the main duct. No intraparenchymal cyst. No peripancreatic edema. Spleen: Wedge-shaped area of decreased perfusion in the the peripheral spleen suggests infarct, age indeterminate. Adrenals/Urinary Tract: No adrenal nodule or mass. 4 cm cyst identified lower pole right kidney. 8 mm well-defined low-density lesion in the lower interpolar region of the left kidney is too small to characterize but likely a cyst. No evidence for hydroureter. The urinary bladder appears normal for the degree of distention. Stomach/Bowel: Status post gastric bypass. Duodenum is normally positioned as is the ligament of Treitz. No small bowel wall thickening. No small bowel dilatation. The terminal ileum is normal. The appendix is not visualized, but there is no edema or inflammation in the region of the cecum. Diverticular changes are noted in the left colon without evidence of diverticulitis. Vascular/Lymphatic: There is abdominal aortic atherosclerosis without aneurysm. There is no gastrohepatic or hepatoduodenal ligament  lymphadenopathy. No intraperitoneal or retroperitoneal lymphadenopathy. No pelvic sidewall lymphadenopathy. Reproductive: Prostate gland unremarkable. Penile prosthesis evident with irregular contour of the reservoir. Reservoir rupture not excluded. Other: No intraperitoneal free fluid. Musculoskeletal: Bilateral SI joint effusion evident. Lower lumbar fusion hardware associated. Patient is status post vertebral augmentation at L1. Review of the MIP images confirms the above findings. IMPRESSION: 1. Tiny segmental to subsegmental pulmonary embolus in the left upper lobe with potential miniscule focus of embolism in a distal pulmonary artery to the lingula. 2. Advanced interstitial, basilar predominant fibrotic lung disease. Scattered areas of ground-glass attenuation are associated with bronchiectasis. 3. Enlargement of the main pulmonary arteries suggests pulmonary arterial hypertension. 4. Asymmetric irregular wall thickening noted distal esophagus. Distal esophageal neoplasm a concern. 5. Mild  mediastinal and hilar lymphadenopathy, possibly reactive. In the setting of neoplasm, metastatic disease would be a concern. 6. 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 7. Status post gastric bypass. These results will be called to the ordering clinician or representative by the Radiologist Assistant, and communication documented in the PACS or zVision Dashboard. Electronically Signed   By: Misty Stanley M.D.   On: 01/12/2018 12:25   Dg Chest Port 1 View  Addendum Date: 01/14/2018   ADDENDUM REPORT: 01/14/2018 06:35 ADDENDUM: The sentence should read focal density is now seen in the bases bilaterally Electronically Signed   By: Inez Catalina M.D.   On: 01/14/2018 06:35   Result Date: 01/14/2018 CLINICAL DATA:  Shortness of breath EXAM: PORTABLE CHEST 1 VIEW COMPARISON:  01/12/2018 FINDINGS: Cardiac shadow is stable. Aortic calcifications are again noted. The  lungs are again well aerated with diffuse fibrotic changes similar to that seen on the prior CT examination. Some increased focal density is noted in the bases bilaterally likely representing acute superimposed infiltrate. No effusion or pneumothorax is seen. Postsurgical changes in the left shoulder are noted. IMPRESSION: Acute on chronic basilar infiltrates with underlying fibrotic change. Electronically Signed: By: Inez Catalina M.D. On: 01/14/2018 06:32   Vas [place] Lower Extremity Venous (dvt) (only [facility])  Result Date: 01/12/2018  Lower Venous Study Indications: Pulmonary embolism.  Performing Technologist: [facility]  Examination Guidelines: A complete evaluation includes B-mode imaging, spectral Doppler, color Doppler, and power Doppler as needed of all accessible portions of each vessel. Bilateral testing is considered an integral part of a complete examination. Limited examinations for reoccurring indications may be performed as noted. The reflux portion of the exam is performed with the patient in reverse Trendelenburg.  Right Venous Findings: +---------+---------------+---------+-----------+----------+-------+          CompressibilityPhasicitySpontaneityPropertiesSummary +---------+---------------+---------+-----------+----------+-------+ CFV      Full           Yes      Yes                          +---------+---------------+---------+-----------+----------+-------+ SFJ      Full                                                 +---------+---------------+---------+-----------+----------+-------+ FV Prox  Full                                                 +---------+---------------+---------+-----------+----------+-------+ FV Mid   Full                                                 +---------+---------------+---------+-----------+----------+-------+ FV DistalFull                                                  +---------+---------------+---------+-----------+----------+-------+ PFV      Full                                                 +---------+---------------+---------+-----------+----------+-------+  POP      Full           Yes      Yes                          +---------+---------------+---------+-----------+----------+-------+ PTV      Full                                                 +---------+---------------+---------+-----------+----------+-------+ PERO     Full                                                 +---------+---------------+---------+-----------+----------+-------+  Left Venous Findings: +---------+---------------+---------+-----------+----------+-------+          CompressibilityPhasicitySpontaneityPropertiesSummary +---------+---------------+---------+-----------+----------+-------+ CFV      Full           Yes      Yes                          +---------+---------------+---------+-----------+----------+-------+ SFJ      Full                                                 +---------+---------------+---------+-----------+----------+-------+ FV Prox  Full                                                 +---------+---------------+---------+-----------+----------+-------+ FV Mid   Full                                                 +---------+---------------+---------+-----------+----------+-------+ FV DistalFull                                                 +---------+---------------+---------+-----------+----------+-------+ PFV      Full                                                 +---------+---------------+---------+-----------+----------+-------+ POP      Full           Yes      Yes                          +---------+---------------+---------+-----------+----------+-------+ PTV      Full                                                  +---------+---------------+---------+-----------+----------+-------+  PERO     Full                                                 +---------+---------------+---------+-----------+----------+-------+    Summary: Right: There is no evidence of deep vein thrombosis in the lower extremity. No cystic structure found in the popliteal fossa. Left: There is no evidence of deep vein thrombosis in the lower extremity. No cystic structure found in the popliteal fossa.  *See table(s) above for measurements and observations. Electronically signed by Quay Burow MD on 01/12/2018 at 4:45:11 PM.    Final      Assessment & Plan:   Pulmonary fibrosis ([place]) - CTA 12/4 showed advanced interstitial, basilar predominant fibrotic lung disease, scattered areas of groundglass attenuation are associated with bronchiectasis - UIP pattern consistent with IPF. No alternative diagnosis found. Symptoms felt to be related to disease progression and not flare - Continue home oxygen 2-4 L keep O2 >90% - RX Prednisone taper per discharge summary - Needs pulmonary rehab and PT eval - Started discussion for possible treatment with anti-fibrotic medication (OFEV), patient/family seem interested would need patient assistance  - Needs full PFTs if able - Needs rheumatoid factor (ANA and CCP completed) - Refer to ILD clinic, Dr. Ramswamy/Dr. Vaughan Browner first available   Asthma, moderate persistent - Continue nebulizer three times a day  Pulmonary embolus [facility]) - Hematology consulted for new PE on Warfarin, they did not feel  PE was responsible for acute worsening of dyspnea. Dr. Maylon Peppers recommended therapeutic Lovenox but patient hesitant about life long injections. Ultimately patient was started on Eliquis, however, patient unable afford and resumed coumadin. Will reach out to hematology for input.   Abnormal finding on lung imaging - As discussed above. In addition, asymmetric irregular wall thickening noted distal esophagus.  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  IPF would not be a contraindication to continue this  45 mins spent on case, >50% face to face   Martyn Ehrich, NP 01/17/2018

## 2018-01-17 NOTE — Telephone Encounter (Signed)
Called and spoke to patient's wife. Patient was discharged from the hospital on 01/15/18. Patient's wife stated that she has several concerns. Patient uses a nebulizer at home and they only have a few days worth of albuterol left at home. Patient in the hospital was getting Duoneb and wife reported she isn't sure which he should be on but will need an Rx either way. During the hospital visit wife reports that they were going to switch patient from Coumadin to Eliquis but that the Eliquis is not affordable for them so patient is needing to stay on the Coumadin.  Patient's wife reported that patient was discharged on 2 L of oxygen via nasal canula but that he is continuing to have difficulty breathing and she isn't sure if the 2L is enough. Patient was scheduled to come in for hospital follow up on 12/13 but with concerns wanted him to be seen sooner.  Patient is now scheduled for 01/17/18 at 1615 with Derl Barrow, NP.  Routing to [employer] as FYI for today's appointment.

## 2018-01-17 NOTE — Telephone Encounter (Signed)
Attempted to call patient's wife back, no answer, left message to call us back.

## 2018-01-17 NOTE — Telephone Encounter (Signed)
Patient's wife returned call, CB is [phone]

## 2018-01-17 NOTE — Telephone Encounter (Signed)
Needs GI referral as well for abnormal findings on CTA/ Asymmetric irregular wall thickening noted distal esophagus. Distal esophageal neoplasm a concern.

## 2018-01-18 ENCOUNTER — Emergency Department (HOSPITAL_COMMUNITY): Payer: Medicare Other

## 2018-01-18 ENCOUNTER — Other Ambulatory Visit: Payer: Self-pay

## 2018-01-18 ENCOUNTER — Encounter (HOSPITAL_COMMUNITY): Payer: Self-pay | Admitting: Internal Medicine

## 2018-01-18 ENCOUNTER — Telehealth: Payer: Self-pay | Admitting: Primary Care

## 2018-01-18 ENCOUNTER — Inpatient Hospital Stay (HOSPITAL_COMMUNITY)
Admission: EM | Admit: 2018-01-18 | Discharge: 2018-01-29 | DRG: 196 | Disposition: A | Discharge status: Skilled Nursing Facility | Payer: Medicare Other | Attending: Internal Medicine | Admitting: Internal Medicine

## 2018-01-18 DIAGNOSIS — J84112 Idiopathic pulmonary fibrosis: Secondary | ICD-10-CM | POA: Diagnosis not present

## 2018-01-18 DIAGNOSIS — M810 Age-related osteoporosis without current pathological fracture: Secondary | ICD-10-CM | POA: Diagnosis present

## 2018-01-18 DIAGNOSIS — Z85828 Personal history of other malignant neoplasm of skin: Secondary | ICD-10-CM

## 2018-01-18 DIAGNOSIS — Z8719 Personal history of other diseases of the digestive system: Secondary | ICD-10-CM

## 2018-01-18 DIAGNOSIS — I4891 Unspecified atrial fibrillation: Secondary | ICD-10-CM | POA: Diagnosis present

## 2018-01-18 DIAGNOSIS — Z8249 Family history of ischemic heart disease and other diseases of the circulatory system: Secondary | ICD-10-CM

## 2018-01-18 DIAGNOSIS — T380X5A Adverse effect of glucocorticoids and synthetic analogues, initial encounter: Secondary | ICD-10-CM | POA: Diagnosis not present

## 2018-01-18 DIAGNOSIS — Z9109 Other allergy status, other than to drugs and biological substances: Secondary | ICD-10-CM

## 2018-01-18 DIAGNOSIS — I2693 Single subsegmental pulmonary embolism without acute cor pulmonale: Secondary | ICD-10-CM | POA: Diagnosis present

## 2018-01-18 DIAGNOSIS — J810 Acute pulmonary edema: Secondary | ICD-10-CM | POA: Diagnosis not present

## 2018-01-18 DIAGNOSIS — R262 Difficulty in walking, not elsewhere classified: Secondary | ICD-10-CM | POA: Diagnosis not present

## 2018-01-18 DIAGNOSIS — J8410 Pulmonary fibrosis, unspecified: Secondary | ICD-10-CM | POA: Diagnosis not present

## 2018-01-18 DIAGNOSIS — S299XXA Unspecified injury of thorax, initial encounter: Secondary | ICD-10-CM | POA: Diagnosis not present

## 2018-01-18 DIAGNOSIS — R509 Fever, unspecified: Secondary | ICD-10-CM

## 2018-01-18 DIAGNOSIS — N179 Acute kidney failure, unspecified: Secondary | ICD-10-CM | POA: Diagnosis present

## 2018-01-18 DIAGNOSIS — E1151 Type 2 diabetes mellitus with diabetic peripheral angiopathy without gangrene: Secondary | ICD-10-CM | POA: Diagnosis present

## 2018-01-18 DIAGNOSIS — J9621 Acute and chronic respiratory failure with hypoxia: Secondary | ICD-10-CM | POA: Diagnosis not present

## 2018-01-18 DIAGNOSIS — I739 Peripheral vascular disease, unspecified: Secondary | ICD-10-CM | POA: Diagnosis not present

## 2018-01-18 DIAGNOSIS — K219 Gastro-esophageal reflux disease without esophagitis: Secondary | ICD-10-CM | POA: Diagnosis not present

## 2018-01-18 DIAGNOSIS — M797 Fibromyalgia: Secondary | ICD-10-CM | POA: Diagnosis not present

## 2018-01-18 DIAGNOSIS — N183 Chronic kidney disease, stage 3 unspecified: Secondary | ICD-10-CM | POA: Diagnosis present

## 2018-01-18 DIAGNOSIS — Z9981 Dependence on supplemental oxygen: Secondary | ICD-10-CM

## 2018-01-18 DIAGNOSIS — I482 Chronic atrial fibrillation, unspecified: Secondary | ICD-10-CM | POA: Diagnosis present

## 2018-01-18 DIAGNOSIS — R069 Unspecified abnormalities of breathing: Secondary | ICD-10-CM | POA: Diagnosis not present

## 2018-01-18 DIAGNOSIS — I2699 Other pulmonary embolism without acute cor pulmonale: Secondary | ICD-10-CM | POA: Diagnosis not present

## 2018-01-18 DIAGNOSIS — W19XXXA Unspecified fall, initial encounter: Secondary | ICD-10-CM

## 2018-01-18 DIAGNOSIS — J4540 Moderate persistent asthma, uncomplicated: Secondary | ICD-10-CM | POA: Diagnosis present

## 2018-01-18 DIAGNOSIS — M2550 Pain in unspecified joint: Secondary | ICD-10-CM | POA: Diagnosis not present

## 2018-01-18 DIAGNOSIS — Y92230 Patient room in hospital as the place of occurrence of the external cause: Secondary | ICD-10-CM | POA: Diagnosis not present

## 2018-01-18 DIAGNOSIS — S199XXA Unspecified injury of neck, initial encounter: Secondary | ICD-10-CM | POA: Diagnosis not present

## 2018-01-18 DIAGNOSIS — Z515 Encounter for palliative care: Secondary | ICD-10-CM | POA: Diagnosis not present

## 2018-01-18 DIAGNOSIS — Z9841 Cataract extraction status, right eye: Secondary | ICD-10-CM

## 2018-01-18 DIAGNOSIS — E1122 Type 2 diabetes mellitus with diabetic chronic kidney disease: Secondary | ICD-10-CM | POA: Diagnosis present

## 2018-01-18 DIAGNOSIS — Z96612 Presence of left artificial shoulder joint: Secondary | ICD-10-CM | POA: Diagnosis present

## 2018-01-18 DIAGNOSIS — Z825 Family history of asthma and other chronic lower respiratory diseases: Secondary | ICD-10-CM

## 2018-01-18 DIAGNOSIS — Z8042 Family history of malignant neoplasm of prostate: Secondary | ICD-10-CM

## 2018-01-18 DIAGNOSIS — B370 Candidal stomatitis: Secondary | ICD-10-CM | POA: Diagnosis not present

## 2018-01-18 DIAGNOSIS — Z7951 Long term (current) use of inhaled steroids: Secondary | ICD-10-CM

## 2018-01-18 DIAGNOSIS — B3781 Candidal esophagitis: Secondary | ICD-10-CM | POA: Diagnosis not present

## 2018-01-18 DIAGNOSIS — S0990XA Unspecified injury of head, initial encounter: Secondary | ICD-10-CM | POA: Diagnosis not present

## 2018-01-18 DIAGNOSIS — Z66 Do not resuscitate: Secondary | ICD-10-CM | POA: Diagnosis present

## 2018-01-18 DIAGNOSIS — J9601 Acute respiratory failure with hypoxia: Secondary | ICD-10-CM | POA: Diagnosis not present

## 2018-01-18 DIAGNOSIS — R64 Cachexia: Secondary | ICD-10-CM | POA: Diagnosis present

## 2018-01-18 DIAGNOSIS — Z86711 Personal history of pulmonary embolism: Secondary | ICD-10-CM

## 2018-01-18 DIAGNOSIS — R1312 Dysphagia, oropharyngeal phase: Secondary | ICD-10-CM | POA: Diagnosis not present

## 2018-01-18 DIAGNOSIS — S3993XA Unspecified injury of pelvis, initial encounter: Secondary | ICD-10-CM | POA: Diagnosis not present

## 2018-01-18 DIAGNOSIS — J849 Interstitial pulmonary disease, unspecified: Secondary | ICD-10-CM | POA: Diagnosis not present

## 2018-01-18 DIAGNOSIS — G2581 Restless legs syndrome: Secondary | ICD-10-CM | POA: Diagnosis not present

## 2018-01-18 DIAGNOSIS — B379 Candidiasis, unspecified: Secondary | ICD-10-CM | POA: Diagnosis not present

## 2018-01-18 DIAGNOSIS — S2241XA Multiple fractures of ribs, right side, initial encounter for closed fracture: Secondary | ICD-10-CM | POA: Diagnosis present

## 2018-01-18 DIAGNOSIS — I4581 Long QT syndrome: Secondary | ICD-10-CM | POA: Diagnosis present

## 2018-01-18 DIAGNOSIS — S2241XD Multiple fractures of ribs, right side, subsequent encounter for fracture with routine healing: Secondary | ICD-10-CM | POA: Diagnosis not present

## 2018-01-18 DIAGNOSIS — D631 Anemia in chronic kidney disease: Secondary | ICD-10-CM | POA: Diagnosis present

## 2018-01-18 DIAGNOSIS — D72829 Elevated white blood cell count, unspecified: Secondary | ICD-10-CM

## 2018-01-18 DIAGNOSIS — Z6824 Body mass index (BMI) 24.0-24.9, adult: Secondary | ICD-10-CM

## 2018-01-18 DIAGNOSIS — S0031XA Abrasion of nose, initial encounter: Secondary | ICD-10-CM | POA: Diagnosis present

## 2018-01-18 DIAGNOSIS — I4819 Other persistent atrial fibrillation: Secondary | ICD-10-CM | POA: Diagnosis not present

## 2018-01-18 DIAGNOSIS — Z7189 Other specified counseling: Secondary | ICD-10-CM | POA: Diagnosis not present

## 2018-01-18 DIAGNOSIS — Z820 Family history of epilepsy and other diseases of the nervous system: Secondary | ICD-10-CM

## 2018-01-18 DIAGNOSIS — Z803 Family history of malignant neoplasm of breast: Secondary | ICD-10-CM

## 2018-01-18 DIAGNOSIS — C61 Malignant neoplasm of prostate: Secondary | ICD-10-CM | POA: Diagnosis present

## 2018-01-18 DIAGNOSIS — Z86010 Personal history of colonic polyps: Secondary | ICD-10-CM

## 2018-01-18 DIAGNOSIS — K5900 Constipation, unspecified: Secondary | ICD-10-CM | POA: Diagnosis not present

## 2018-01-18 DIAGNOSIS — I10 Essential (primary) hypertension: Secondary | ICD-10-CM | POA: Diagnosis not present

## 2018-01-18 DIAGNOSIS — Z800 Family history of malignant neoplasm of digestive organs: Secondary | ICD-10-CM

## 2018-01-18 DIAGNOSIS — Z7901 Long term (current) use of anticoagulants: Secondary | ICD-10-CM

## 2018-01-18 DIAGNOSIS — R0602 Shortness of breath: Secondary | ICD-10-CM | POA: Diagnosis not present

## 2018-01-18 DIAGNOSIS — R52 Pain, unspecified: Secondary | ICD-10-CM | POA: Diagnosis not present

## 2018-01-18 DIAGNOSIS — I130 Hypertensive heart and chronic kidney disease with heart failure and stage 1 through stage 4 chronic kidney disease, or unspecified chronic kidney disease: Secondary | ICD-10-CM | POA: Diagnosis present

## 2018-01-18 DIAGNOSIS — Z981 Arthrodesis status: Secondary | ICD-10-CM

## 2018-01-18 DIAGNOSIS — Z79899 Other long term (current) drug therapy: Secondary | ICD-10-CM

## 2018-01-18 DIAGNOSIS — Z9884 Bariatric surgery status: Secondary | ICD-10-CM | POA: Diagnosis not present

## 2018-01-18 DIAGNOSIS — G4700 Insomnia, unspecified: Secondary | ICD-10-CM | POA: Diagnosis not present

## 2018-01-18 DIAGNOSIS — R1084 Generalized abdominal pain: Secondary | ICD-10-CM | POA: Diagnosis not present

## 2018-01-18 DIAGNOSIS — Z960 Presence of urogenital implants: Secondary | ICD-10-CM | POA: Diagnosis present

## 2018-01-18 DIAGNOSIS — E1149 Type 2 diabetes mellitus with other diabetic neurological complication: Secondary | ICD-10-CM | POA: Diagnosis present

## 2018-01-18 DIAGNOSIS — E1165 Type 2 diabetes mellitus with hyperglycemia: Secondary | ICD-10-CM | POA: Diagnosis present

## 2018-01-18 DIAGNOSIS — E43 Unspecified severe protein-calorie malnutrition: Secondary | ICD-10-CM

## 2018-01-18 DIAGNOSIS — Z7401 Bed confinement status: Secondary | ICD-10-CM | POA: Diagnosis not present

## 2018-01-18 DIAGNOSIS — M6281 Muscle weakness (generalized): Secondary | ICD-10-CM | POA: Diagnosis not present

## 2018-01-18 DIAGNOSIS — N485 Ulcer of penis: Secondary | ICD-10-CM | POA: Diagnosis not present

## 2018-01-18 DIAGNOSIS — D400 Neoplasm of uncertain behavior of prostate: Secondary | ICD-10-CM | POA: Diagnosis not present

## 2018-01-18 DIAGNOSIS — M1380 Other specified arthritis, unspecified site: Secondary | ICD-10-CM | POA: Diagnosis not present

## 2018-01-18 DIAGNOSIS — E46 Unspecified protein-calorie malnutrition: Secondary | ICD-10-CM | POA: Diagnosis not present

## 2018-01-18 DIAGNOSIS — H9193 Unspecified hearing loss, bilateral: Secondary | ICD-10-CM | POA: Diagnosis present

## 2018-01-18 DIAGNOSIS — S0993XA Unspecified injury of face, initial encounter: Secondary | ICD-10-CM | POA: Diagnosis not present

## 2018-01-18 DIAGNOSIS — Z96653 Presence of artificial knee joint, bilateral: Secondary | ICD-10-CM | POA: Diagnosis present

## 2018-01-18 DIAGNOSIS — Y92009 Unspecified place in unspecified non-institutional (private) residence as the place of occurrence of the external cause: Secondary | ICD-10-CM

## 2018-01-18 DIAGNOSIS — E119 Type 2 diabetes mellitus without complications: Secondary | ICD-10-CM | POA: Diagnosis not present

## 2018-01-18 DIAGNOSIS — Z87442 Personal history of urinary calculi: Secondary | ICD-10-CM

## 2018-01-18 DIAGNOSIS — R05 Cough: Secondary | ICD-10-CM | POA: Diagnosis not present

## 2018-01-18 DIAGNOSIS — Z86718 Personal history of other venous thrombosis and embolism: Secondary | ICD-10-CM

## 2018-01-18 DIAGNOSIS — Z91040 Latex allergy status: Secondary | ICD-10-CM

## 2018-01-18 DIAGNOSIS — R0781 Pleurodynia: Secondary | ICD-10-CM | POA: Diagnosis not present

## 2018-01-18 DIAGNOSIS — W1830XA Fall on same level, unspecified, initial encounter: Secondary | ICD-10-CM | POA: Diagnosis present

## 2018-01-18 DIAGNOSIS — I5031 Acute diastolic (congestive) heart failure: Secondary | ICD-10-CM | POA: Diagnosis present

## 2018-01-18 DIAGNOSIS — E785 Hyperlipidemia, unspecified: Secondary | ICD-10-CM | POA: Diagnosis not present

## 2018-01-18 DIAGNOSIS — F411 Generalized anxiety disorder: Secondary | ICD-10-CM | POA: Diagnosis present

## 2018-01-18 DIAGNOSIS — Z8049 Family history of malignant neoplasm of other genital organs: Secondary | ICD-10-CM

## 2018-01-18 DIAGNOSIS — Z882 Allergy status to sulfonamides status: Secondary | ICD-10-CM

## 2018-01-18 DIAGNOSIS — Z9842 Cataract extraction status, left eye: Secondary | ICD-10-CM

## 2018-01-18 HISTORY — DX: Idiopathic pulmonary fibrosis: J84.112

## 2018-01-18 LAB — CBC WITH DIFFERENTIAL/PLATELET
Abs Immature Granulocytes: 0.25 10*3/uL — ABNORMAL HIGH (ref 0.00–0.07)
Basophils Absolute: 0 10*3/uL (ref 0.0–0.1)
Basophils Relative: 0 %
Eosinophils Absolute: 0.3 10*3/uL (ref 0.0–0.5)
Eosinophils Relative: 1 %
HCT: 39.8 % (ref 39.0–52.0)
Hemoglobin: 12.2 g/dL — ABNORMAL LOW (ref 13.0–17.0)
Immature Granulocytes: 1 %
LYMPHS PCT: 5 %
Lymphs Abs: 1 10*3/uL (ref 0.7–4.0)
MCH: 28.4 pg (ref 26.0–34.0)
MCHC: 30.7 g/dL (ref 30.0–36.0)
MCV: 92.6 fL (ref 80.0–100.0)
Monocytes Absolute: 1.8 10*3/uL — ABNORMAL HIGH (ref 0.1–1.0)
Monocytes Relative: 8 %
NRBC: 0 % (ref 0.0–0.2)
Neutro Abs: 18.4 10*3/uL — ABNORMAL HIGH (ref 1.7–7.7)
Neutrophils Relative %: 85 %
Platelets: 200 10*3/uL (ref 150–400)
RBC: 4.3 MIL/uL (ref 4.22–5.81)
RDW: 13.1 % (ref 11.5–15.5)
WBC: 21.7 10*3/uL — AB (ref 4.0–10.5)

## 2018-01-18 LAB — I-STAT ARTERIAL BLOOD GAS, ED
Acid-base deficit: 2 mmol/L (ref 0.0–2.0)
Bicarbonate: 21.2 mmol/L (ref 20.0–28.0)
O2 Saturation: 99 %
PCO2 ART: 32.1 mmHg (ref 32.0–48.0)
TCO2: 22 mmol/L (ref 22–32)
pH, Arterial: 7.428 (ref 7.350–7.450)
pO2, Arterial: 118 mmHg — ABNORMAL HIGH (ref 83.0–108.0)

## 2018-01-18 LAB — PROTIME-INR
INR: 2.4 ratio — ABNORMAL HIGH (ref 0.8–1.0)
INR: 2.66
PROTHROMBIN TIME: 28 s — AB (ref 11.4–15.2)
Prothrombin Time: 27.5 s — ABNORMAL HIGH (ref 9.6–13.1)

## 2018-01-18 LAB — COMPREHENSIVE METABOLIC PANEL
ALT: 19 U/L (ref 0–44)
AST: 28 U/L (ref 15–41)
Albumin: 2.7 g/dL — ABNORMAL LOW (ref 3.5–5.0)
Alkaline Phosphatase: 86 U/L (ref 38–126)
Anion gap: 14 (ref 5–15)
BILIRUBIN TOTAL: 0.8 mg/dL (ref 0.3–1.2)
BUN: 28 mg/dL — ABNORMAL HIGH (ref 8–23)
CO2: 22 mmol/L (ref 22–32)
Calcium: 8.6 mg/dL — ABNORMAL LOW (ref 8.9–10.3)
Chloride: 99 mmol/L (ref 98–111)
Creatinine, Ser: 1.53 mg/dL — ABNORMAL HIGH (ref 0.61–1.24)
GFR calc Af Amer: 50 mL/min — ABNORMAL LOW (ref >60)
GFR, EST NON AFRICAN AMERICAN: 44 mL/min — AB (ref >60)
Glucose, Bld: 230 mg/dL — ABNORMAL HIGH (ref 70–99)
Potassium: 4.5 mmol/L (ref 3.5–5.1)
Sodium: 135 mmol/L (ref 135–145)
TOTAL PROTEIN: 6.5 g/dL (ref 6.5–8.1)

## 2018-01-18 LAB — MRSA PCR SCREENING: MRSA by PCR: NEGATIVE

## 2018-01-18 LAB — I-STAT CG4 LACTIC ACID, ED: Lactic Acid, Venous: 1.1 mmol/L (ref 0.5–1.9)

## 2018-01-18 LAB — I-STAT TROPONIN, ED: Troponin i, poc: 0.03 ng/mL (ref 0.00–0.08)

## 2018-01-18 LAB — MAGNESIUM: Magnesium: 1.9 mg/dL (ref 1.7–2.4)

## 2018-01-18 LAB — RHEUMATOID FACTOR: Rheumatoid fact SerPl-aCnc: 14 IU/mL — ABNORMAL HIGH (ref <14)

## 2018-01-18 LAB — GLUCOSE, CAPILLARY: GLUCOSE-CAPILLARY: 268 mg/dL — AB (ref 70–99)

## 2018-01-18 MED ORDER — INSULIN ASPART 100 UNIT/ML ~~LOC~~ SOLN
0.0000 [IU] | Freq: Three times a day (TID) | SUBCUTANEOUS | Status: DC
  Administered 2018-01-18 – 2018-01-19 (×2): 8 [IU] via SUBCUTANEOUS
  Administered 2018-01-19 (×2): 5 [IU] via SUBCUTANEOUS
  Administered 2018-01-20: 8 [IU] via SUBCUTANEOUS
  Administered 2018-01-20: 11 [IU] via SUBCUTANEOUS
  Administered 2018-01-20: 15 [IU] via SUBCUTANEOUS
  Administered 2018-01-21: 8 [IU] via SUBCUTANEOUS
  Administered 2018-01-21 (×2): 5 [IU] via SUBCUTANEOUS
  Administered 2018-01-22: 2 [IU] via SUBCUTANEOUS
  Administered 2018-01-22: 8 [IU] via SUBCUTANEOUS
  Administered 2018-01-22: 11 [IU] via SUBCUTANEOUS
  Administered 2018-01-23: 3 [IU] via SUBCUTANEOUS
  Administered 2018-01-23: 2 [IU] via SUBCUTANEOUS
  Administered 2018-01-23 – 2018-01-24 (×2): 5 [IU] via SUBCUTANEOUS
  Administered 2018-01-24: 3 [IU] via SUBCUTANEOUS
  Administered 2018-01-24: 5 [IU] via SUBCUTANEOUS
  Administered 2018-01-25: 8 [IU] via SUBCUTANEOUS
  Administered 2018-01-25 – 2018-01-26 (×3): 5 [IU] via SUBCUTANEOUS
  Administered 2018-01-26: 3 [IU] via SUBCUTANEOUS
  Administered 2018-01-26: 5 [IU] via SUBCUTANEOUS
  Administered 2018-01-27: 2 [IU] via SUBCUTANEOUS
  Administered 2018-01-27: 5 [IU] via SUBCUTANEOUS
  Administered 2018-01-28 (×2): 3 [IU] via SUBCUTANEOUS

## 2018-01-18 MED ORDER — SODIUM CHLORIDE 0.9% FLUSH
3.0000 mL | Freq: Two times a day (BID) | INTRAVENOUS | Status: DC
  Administered 2018-01-18 – 2018-01-29 (×21): 3 mL via INTRAVENOUS

## 2018-01-18 MED ORDER — LACTATED RINGERS IV BOLUS
1000.0000 mL | Freq: Once | INTRAVENOUS | Status: AC
  Administered 2018-01-18: 1000 mL via INTRAVENOUS

## 2018-01-18 MED ORDER — METHYLPREDNISOLONE SODIUM SUCC 125 MG IJ SOLR
125.0000 mg | Freq: Four times a day (QID) | INTRAMUSCULAR | Status: DC
  Administered 2018-01-18 – 2018-01-21 (×12): 125 mg via INTRAVENOUS
  Filled 2018-01-18 (×12): qty 2

## 2018-01-18 MED ORDER — ATORVASTATIN CALCIUM 80 MG PO TABS
80.0000 mg | ORAL_TABLET | Freq: Every day | ORAL | Status: DC
  Administered 2018-01-18 – 2018-01-28 (×11): 80 mg via ORAL
  Filled 2018-01-18 (×11): qty 1

## 2018-01-18 MED ORDER — FLUOXETINE HCL 20 MG PO CAPS
60.0000 mg | ORAL_CAPSULE | Freq: Every day | ORAL | Status: DC
  Administered 2018-01-18 – 2018-01-28 (×11): 60 mg via ORAL
  Filled 2018-01-18 (×11): qty 3

## 2018-01-18 MED ORDER — DILTIAZEM LOAD VIA INFUSION
20.0000 mg | Freq: Once | INTRAVENOUS | Status: AC
  Administered 2018-01-18: 20 mg via INTRAVENOUS
  Filled 2018-01-18: qty 20

## 2018-01-18 MED ORDER — FENTANYL CITRATE (PF) 100 MCG/2ML IJ SOLN
50.0000 ug | Freq: Once | INTRAMUSCULAR | Status: AC
  Administered 2018-01-18: 50 ug via INTRAVENOUS
  Filled 2018-01-18: qty 2

## 2018-01-18 MED ORDER — ENSURE ENLIVE PO LIQD
237.0000 mL | Freq: Two times a day (BID) | ORAL | Status: DC
  Administered 2018-01-19 – 2018-01-26 (×5): 237 mL via ORAL

## 2018-01-18 MED ORDER — ALBUTEROL SULFATE (2.5 MG/3ML) 0.083% IN NEBU: 2.5000 mg | INHALATION_SOLUTION | RESPIRATORY_TRACT | Status: DC | PRN

## 2018-01-18 MED ORDER — DILTIAZEM HCL-DEXTROSE 100-5 MG/100ML-% IV SOLN (PREMIX)
5.0000 mg/h | INTRAVENOUS | Status: DC
  Administered 2018-01-18 – 2018-01-19 (×2): 5 mg/h via INTRAVENOUS
  Filled 2018-01-18 (×4): qty 100

## 2018-01-18 MED ORDER — FUROSEMIDE 10 MG/ML IJ SOLN
40.0000 mg | Freq: Two times a day (BID) | INTRAMUSCULAR | Status: DC
  Administered 2018-01-18 – 2018-01-19 (×2): 40 mg via INTRAVENOUS
  Filled 2018-01-18 (×2): qty 4

## 2018-01-18 MED ORDER — METHYLPREDNISOLONE SODIUM SUCC 125 MG IJ SOLR
125.0000 mg | Freq: Once | INTRAMUSCULAR | Status: AC
  Administered 2018-01-18: 125 mg via INTRAVENOUS
  Filled 2018-01-18: qty 2

## 2018-01-18 MED ORDER — ZOLPIDEM TARTRATE 5 MG PO TABS
5.0000 mg | ORAL_TABLET | Freq: Every evening | ORAL | Status: DC | PRN
  Administered 2018-01-18 – 2018-01-28 (×11): 5 mg via ORAL
  Filled 2018-01-18 (×11): qty 1

## 2018-01-18 MED ORDER — IPRATROPIUM-ALBUTEROL 0.5-2.5 (3) MG/3ML IN SOLN
3.0000 mL | Freq: Four times a day (QID) | RESPIRATORY_TRACT | Status: DC
  Administered 2018-01-18: 3 mL via RESPIRATORY_TRACT
  Filled 2018-01-18: qty 3

## 2018-01-18 MED ORDER — INSULIN ASPART 100 UNIT/ML ~~LOC~~ SOLN
0.0000 [IU] | Freq: Every day | SUBCUTANEOUS | Status: DC
  Administered 2018-01-19: 5 [IU] via SUBCUTANEOUS
  Administered 2018-01-19: 2 [IU] via SUBCUTANEOUS
  Administered 2018-01-20 – 2018-01-21 (×2): 3 [IU] via SUBCUTANEOUS
  Administered 2018-01-25: 2 [IU] via SUBCUTANEOUS

## 2018-01-18 NOTE — ED Provider Notes (Signed)
[facility] Provider Note   CSN: [id number] Arrival date & time: 01/18/18  1014     History   Chief Complaint Chief Complaint  Patient presents with  . Fall  . Chest Pain    HPI [name] is a 77 y.o. male.  Patient with history of atrial fibrillation on warfarin, idiopathic pulmonary fibrosis who presents to the ED after fall.  Patient currently started on steroids yesterday for worsening of pulmonary fibrosis.  Patient has been on 2 to 3 L of oxygen since this past weekend which is new for him.  Patient has been having difficulty getting around with his walker due to shortness of breath and had a mechanical fall today.  He struck his face.  Has left-sided chest wall pain.  No back pain.  The history is provided by the patient and a caregiver.  Shortness of Breath  This is a chronic problem. The problem occurs continuously.The current episode started more than 1 week ago. The problem has been gradually worsening. Associated symptoms include cough. Pertinent negatives include no fever, no sore throat, no ear pain, no chest pain, no vomiting, no abdominal pain and no rash. Precipitated by: Patient with IPF. He has tried beta-agonist inhalers for the symptoms. The treatment provided mild relief. He has had prior ED visits. Associated medical issues include COPD.    Past Medical History:  Diagnosis Date  . Arthritis    hands   . Asthma   . Chronic atrial fibrillation    a.fib on warfarin- Dr. Percival Spanish follows  . Diverticulosis of colon (without mention of hemorrhage)   . ED (erectile dysfunction)   . Fibromyalgia   . GERD (gastroesophageal reflux disease)   . Hard of hearing    wears hearing aids  . Headache   . History of blood clots    behind right knee and then went into left lung 62yrs ago  . History of colon polyps   . History of kidney stones   . Hx of diabetes mellitus    "no longer diabetic since gastric bypass" - no  longer taking metformin"  in 2 months "problems with low blood sugar now"  . Hx of gout    but doesn't take any meds  . Inflammation of colonic mucosa    recent admission and release from [facility] 02-28-15-remains on oral antibiotic  . Obesity   . Peripheral vascular disease ([place])   . Pneumonia    last time about 21yrs ago  . Prostate cancer ([place])   . Pulmonary embolism ([place])    over 10 yrs ago "many years ago"  . Restless legs syndrome (RLS)   . Skin cancer   . Unspecified asthma(493.90)    as a child  . Unspecified essential hypertension    hx of no longer on medication due to gastric bypass     Patient Active Problem List   Diagnosis Date Noted  . Acute on chronic respiratory failure with hypoxia ([place]) 01/18/2018  . Pulmonary embolus ([place])   . Abnormal finding on lung imaging 01/12/2018  . Pulmonary fibrosis ([place]) 01/12/2018  . Malignant neoplasm of prostate ([place]) 11/29/2017  . Dizziness 09/01/2017  . Decubitus ulcer of right ankle, stage 1 06/22/2017  . Chronic insomnia 06/22/2017  . Elevated LFTs 06/11/2017  . MRSA (methicillin resistant Staphylococcus aureus) infection 06/11/2017  . Acute pain of right knee 06/11/2017  . Hypotension 06/11/2017  . Primary localized osteoarthritis of right knee 04/27/2017  .  Primary osteoarthritis of right knee 04/27/2017  . Acute neck pain 10/30/2016  . Anticoagulant long-term use 09/14/2016  . S/P panniculectomy 08/25/2016  . Persistent cough for 3 weeks or longer 02/28/2016  . Internal hernia 03/08/2015  . GERD (gastroesophageal reflux disease) 08/03/2014  . Acid reflux 08/03/2014  . ED (erectile dysfunction) of organic origin 07/02/2014  . 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 Counseling regarding end of life decision making 01/23/2014  . Dysphagia, pharyngoesophageal phase 08/30/2013  . Benign essential tremor 08/30/2013  . Postoperative pain, acute, shoulder 05/13/2013  . Arthralgia of shoulder  05/13/2013  . Cervical spinal stenosis 05/09/2013  . Encounter for therapeutic drug monitoring 04/26/2013  . 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 Hereditary and idiopathic neuropathy 12/11/2011  . 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 History of surgical procedure 09/03/2011  . Atrial fibrillation ([place]) 08/12/2011  . Dyspnea 12/08/2010  . Bacterial overgrowth syndrome 10/28/2010  . Nonsurgical dumping syndrome 10/14/2010  . 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 Diverticulitis of colon 09/02/2010  . Vascular disorder of lower extremity 08/01/2010  . Osteoporosis 11/26/2009  . Anemia of chronic disease 09/02/2009  . RENAL INSUFFICIENCY 09/02/2009  . Allergic rhinitis 01/17/2009  . Asthma, moderate persistent 11/13/2008  . Airway hyperreactivity 11/13/2008  . KNEE REPLACEMENT, LEFT, HX OF 12/18/2006  . HERPES ZOSTER W/NERVOUS COMPLICATION NEC 35/57/3220  . HYPERCHOLESTEROLEMIA 05/19/2006  . ANXIETY 05/19/2006  . 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 RESTLESS LEG SYNDROME 05/19/2006  . DIVERTICULOSIS, COLON 05/19/2006  . IRRITABLE BOWEL SYNDROME 05/19/2006  . LOW BACK PAIN, CHRONIC 05/19/2006  . GAD (generalized anxiety disorder) 05/19/2006  . Diverticular disease of large intestine 05/19/2006  . Essential (primary) hypertension 05/19/2006  . Adaptive colitis 05/19/2006    Past Surgical History:  Procedure Laterality Date  . ANTERIOR CERVICAL DECOMP/DISCECTOMY FUSION N/A 05/09/2013   Procedure: ANTERIOR CERVICAL DECOMPRESSION/DISCECTOMY FUSION CERVICAL THREE-FOUR,CERVICAL FOUR-FIVE,CERVICAL FIVE-SIX;  Surgeon: Floyce Stakes, MD;  Location: [facility];  Service: Neurosurgery;  Laterality: N/A;  . ARTERY REPAIR     Left forearm  . BACK SURGERY     x 3  . BREATH TEK H PYLORI  01/08/2011   Procedure: BREATH TEK H PYLORI;  Surgeon: Pedro Earls, MD;  Location: [facility];   Service: General;  Laterality: N/A;  . CARDIOVERSION     x 2 attempts-unsuccessful.  [name] CATARACT EXTRACTION, BILATERAL    . COLONOSCOPY    . CYSTOSCOPY    . ESOPHAGOGASTRODUODENOSCOPY (EGD) WITH PROPOFOL N/A 09/21/2014   Procedure: ESOPHAGOGASTRODUODENOSCOPY (EGD) WITH PROPOFOL;  Surgeon: Manya Silvas, MD;  Location: [facility] ENDOSCOPY;  Service: Endoscopy;  Laterality: N/A;  . EYE SURGERY     cataract bil  . FOOT SURGERY     Left foot   . GASTRIC ROUX-EN-Y  08/11/2011   Procedure: LAPAROSCOPIC ROUX-EN-Y GASTRIC BYPASS WITH UPPER ENDOSCOPY;  Surgeon: Pedro Earls, MD;  Location: [facility];  Service: General;  Laterality: N/A;  . HAND SURGERY     LEFT  . injections in back     x 18  . JOINT REPLACEMENT    . KNEE SURGERY Left    x 4  . KNEE SURGERY Left    arthroscopy  . LAPAROSCOPIC INTERNAL HERNIA REPAIR N/A 03/08/2015   Procedure: LAPAROSCOPIC INTERNAL HERNIA REPAIR ;  Surgeon: [name], MD;  Location: [facility];  Service: General;  Laterality: N/A;  .  LEG SURGERY     FOR NECROTIZING FASCITIS L LEG AND GROIN  . PANNICULECTOMY N/A 08/25/2016   Procedure: PANNICULECTOMY;  Surgeon: [name], MD;  Location: [facility];  Service: General;  Laterality: N/A;  . PENILE PROSTHESIS IMPLANT N/A 07/02/2014   Procedure: PENILE PROTHESIS INFLATABLE 3 PIECE (COLOPLAST) SCROTAL APPROACH;  Surgeon: Kathie Rhodes, MD;  Location: [facility];  Service: Urology;  Laterality: N/A;  . PENILE PROSTHESIS IMPLANT N/A 11/23/2014   Procedure: EXPLORATION AND REVISION OF PENILE PROSTHESIS;  Surgeon: Kathie Rhodes, MD;  Location: [facility];  Service: Urology;  Laterality: N/A;  . PICC INSERTION W/OUT PORT/PUMP    . REPLACEMENT TOTAL KNEE Left    x 2  . SHOULDER ARTHROSCOPY    . SHOULDER SURGERY Left 2014  . TONSILLECTOMY    . TOTAL KNEE ARTHROPLASTY Right 04/27/2017  . TOTAL KNEE ARTHROPLASTY Right 04/27/2017   Procedure: TOTAL KNEE ARTHROPLASTY;  Surgeon: Melrose Nakayama, MD;  Location: [facility];  Service:  Orthopedics;  Laterality: Right;  . TOTAL SHOULDER ARTHROPLASTY Left 01/26/2013   Procedure: TOTAL SHOULDER ARTHROPLASTY;  Surgeon: Nita Sells, MD;  Location: [facility];  Service: Orthopedics;  Laterality: Left;  Left total shoulder arthroplasty        Home Medications    Prior to Admission medications   Medication Sig Start Date End Date Taking? Authorizing Provider  acarbose (PRECOSE) 50 MG tablet Take 50 mg by mouth 3 (three) times daily with meals.   Yes [provider]  atorvastatin (LIPITOR) 80 MG tablet Take 1 tablet (80 mg total) by mouth at bedtime. 02/18/17  Yes Minus Breeding, MD  benzonatate (TESSALON) 200 MG capsule Take 1 capsule (200 mg total) by mouth 3 (three) times daily as needed for cough. 01/14/18  Yes Sheikh, Omair Latif, DO  chlorpheniramine-HYDROcodone (TUSSIONEX) 10-8 MG/5ML SUER Take 5 mLs by mouth every 12 (twelve) hours as needed for cough. Patient taking differently: Take 1.5 mLs by mouth every 12 (twelve) hours as needed for cough.  01/05/18  Yes Lesleigh Noe, MD  FLUoxetine (PROZAC) 20 MG capsule TAKE 3 CAPSULES BY MOUTH EVERYDAY AT BEDTIME Patient taking differently: Take 60 mg by mouth at bedtime.  01/10/18  Yes Bedsole, Amy E, MD  furosemide (LASIX) 20 MG tablet Take 1 tablet (20 mg total) by mouth every other day. Patient taking differently: Take 20 mg by mouth daily.  08/10/17  Yes Duke, Tami Lin, PA  gabapentin (NEURONTIN) 300 MG capsule TAKE 1 CAPSULE BY MOUTH TWICE A DAY Patient taking differently: Take 300 mg by mouth 2 (two) times daily.  08/19/17  Yes Tat, Eustace Quail, DO  hydrocortisone 2.5 % cream Apply 1 application topically 2 (two) times daily as needed (itching).  12/09/17  Yes [provider]  hydroxypropyl methylcellulose / hypromellose (ISOPTO TEARS / GONIOVISC) 2.5 % ophthalmic solution Place 1 drop into both eyes 2 (two) times daily as needed for dry eyes.   Yes [provider]  Omega-3 Fatty Acids  (FISH OIL) 1000 MG CAPS Take 1,000 mg by mouth daily.   Yes [provider]  omeprazole (PRILOSEC) 40 MG capsule TAKE 1 CAPSULE (40 MG TOTAL) BY MOUTH DAILY. Patient taking differently: Take 40 mg by mouth at bedtime.  11/11/17  Yes Bedsole, Amy E, MD  predniSONE (DELTASONE) 10 MG tablet 4 tabs x 4 days, 3 tabs x 4 days; 2 tabs x 4 days; 1 tab x 4 days 01/17/18  Yes Martyn Ehrich, NP  PRESCRIPTION MEDICATION Apply  1 g topically 4 (four) times daily as needed (pain). Baclofen/Gabapentin/Lidocaine Compund Cream   Yes [provider]  primidone (MYSOLINE) 50 MG tablet 150 mg in the morning, 100 mg at night Patient taking differently: Take 100-150 mg by mouth See admin instructions. 150 mg in the morning, 100 mg at night 03/08/17  Yes Tat, Eustace Quail, DO  ranitidine (ZANTAC) 300 MG tablet Take 300 mg by mouth at bedtime.  12/03/17  Yes [provider]  sucralfate (CARAFATE) 1 g tablet Take 1 g by mouth 3 (three) times daily before meals.  12/21/17  Yes [provider]  tamsulosin (FLOMAX) 0.4 MG CAPS capsule Take 0.4 mg by mouth at bedtime.  01/03/18  Yes [provider]  tiZANidine (ZANAFLEX) 4 MG tablet Take 4 mg by mouth every 6 (six) hours as needed for muscle spasms.  12/20/17  Yes [provider]  vitamin E 400 UNIT capsule Take 400 Units by mouth daily.   Yes [provider]  warfarin (COUMADIN) 2.5 MG tablet Take 2.5 mg by mouth daily. Take 2.5mg  SUN and THUR and 3.75mg  on all other days.   Yes [provider]  zolpidem (AMBIEN CR) 12.5 MG CR tablet Take 12.5 mg by mouth at bedtime.   Yes [provider]  Fluticasone-Salmeterol (ADVAIR DISKUS) 250-50 MCG/DOSE AEPB Inhale 1 puff into the lungs 2 (two) times daily. Patient not taking: Reported on 01/18/2018 12/21/17   Jinny Sanders, MD  ipratropium-albuterol (DUONEB) 0.5-2.5 (3) MG/3ML SOLN Take 3 mLs by nebulization every 6 (six) hours as needed. Patient taking  differently: Take 3 mLs by nebulization every 6 (six) hours as needed (shortness of breath).  01/17/18   Martyn Ehrich, NP    Family History Family History  Problem Relation Age of Onset  . Uterine cancer Mother        mets  . Breast cancer Mother   . Cancer Mother        cervical  . Emphysema Father   . Alzheimer's disease Sister   . Prostate cancer Brother   . Heart attack Brother   . Colon cancer Brother 27    Social History Social History   Tobacco Use  . Smoking status: Never Smoker  . Smokeless tobacco: Never Used  Substance Use Topics  . Alcohol use: No    Alcohol/week: 0.0 standard drinks  . Drug use: No     Allergies   Sulfacetamide sodium; Latex; and Adhesive [tape]   Review of Systems Review of Systems  Constitutional: Negative for chills and fever.  HENT: Negative for ear pain and sore throat.   Eyes: Negative for pain and visual disturbance.  Respiratory: Positive for cough and shortness of breath.   Cardiovascular: Negative for chest pain and palpitations.  Gastrointestinal: Negative for abdominal pain and vomiting.  Genitourinary: Negative for dysuria and hematuria.  Musculoskeletal: Positive for gait problem. Negative for arthralgias and back pain.  Skin: Positive for wound. Negative for color change and rash.  Neurological: Negative for seizures and syncope.  All other systems reviewed and are negative.    Physical Exam Updated Vital Signs  ED Triage Vitals  Enc Vitals Group     BP 01/18/18 1025 103/76     Pulse Rate 01/18/18 1025 (!) 144     Resp 01/18/18 1025 (!) 34     Temp --      Temp src --      SpO2 01/18/18 1025 (!) 51 %  Weight --      Height --      Head Circumference --      Peak Flow --      Pain Score 01/18/18 1031 7     Pain Loc --      Pain Edu? --      Excl. in [place]? --     Physical Exam  Constitutional: He appears well-nourished. He appears ill. He appears distressed.  HENT:  Head: Normocephalic.    Abrasion to his nose  Eyes: Conjunctivae are normal.  Neck: Normal range of motion. Neck supple.  Cardiovascular: Normal rate and regular rhythm.  No murmur heard. Pulmonary/Chest: Accessory muscle usage present. Tachypnea noted. He is in respiratory distress. He has decreased breath sounds.  Coarse breath sounds throughout  Abdominal: Soft. There is no tenderness.  Musculoskeletal: He exhibits no edema.       Right lower leg: He exhibits no edema.       Left lower leg: He exhibits no edema.  No midline spinal tenderness, no bony tenderness except for tenderness over the left side of his ribs  Neurological: He is alert.  Skin: Skin is warm and dry.  Psychiatric: He has a normal mood and affect.  Nursing note and vitals reviewed.    ED Treatments / Results  Labs (all labs ordered are listed, but only abnormal results are displayed) Labs Reviewed  CBC WITH DIFFERENTIAL/PLATELET - Abnormal; Notable for the following components:      Result Value   WBC 21.7 (*)    Hemoglobin 12.2 (*)    Neutro Abs 18.4 (*)    Monocytes Absolute 1.8 (*)    Abs Immature Granulocytes 0.25 (*)    All other components within normal limits  COMPREHENSIVE METABOLIC PANEL - Abnormal; Notable for the following components:   Glucose, Bld 230 (*)    BUN 28 (*)    Creatinine, Ser 1.53 (*)    Calcium 8.6 (*)    Albumin 2.7 (*)    GFR calc non Af Amer 44 (*)    GFR calc Af Amer 50 (*)    All other components within normal limits  PROTIME-INR - Abnormal; Notable for the following components:   Prothrombin Time 28.0 (*)    All other components within normal limits  I-STAT ARTERIAL BLOOD GAS, ED - Abnormal; Notable for the following components:   pO2, Arterial 118.0 (*)    All other components within normal limits  URINE CULTURE  CULTURE, BLOOD (ROUTINE X 2)  CULTURE, BLOOD (ROUTINE X 2)  MAGNESIUM  BLOOD GAS, ARTERIAL  URINALYSIS, ROUTINE W REFLEX MICROSCOPIC  I-STAT TROPONIN, ED  I-STAT CG4 LACTIC  ACID, ED    EKG EKG Interpretation  Date/Time:  Tuesday January 18 2018 10:28:00 EST Ventricular Rate:  137 PR Interval:    QRS Duration: 73 QT Interval:  379 QTC Calculation: 573 R Axis:   18 Text Interpretation:  Atrial fibrillation w/ RVR Borderline low voltage, extremity leads Repolarization abnormality, prob rate related Prolonged QT interval Confirmed by Lennice Sites [phone]) on 01/18/2018 10:46:28 AM   Radiology Ct Head Wo Contrast  Result Date: 01/18/2018 CLINICAL DATA:  Fall, hit head.  On blood thinner EXAM: CT HEAD WITHOUT CONTRAST CT MAXILLOFACIAL WITHOUT CONTRAST CT CERVICAL SPINE WITHOUT CONTRAST TECHNIQUE: Multidetector CT imaging of the head, cervical spine, and maxillofacial structures were performed using the standard protocol without intravenous contrast. Multiplanar CT image reconstructions of the cervical spine and maxillofacial structures were also generated. COMPARISON:  None. FINDINGS: CT HEAD FINDINGS Brain: Mild age related volume loss. No acute intracranial abnormality. Specifically, no hemorrhage, hydrocephalus, mass lesion, acute infarction, or significant intracranial injury. Vascular: No hyperdense vessel or unexpected calcification. Skull: No acute calvarial abnormality. Other: None CT MAXILLOFACIAL FINDINGS Osseous: No fracture or mandibular dislocation. No destructive process. Orbits: Negative. No traumatic or inflammatory finding. Sinuses: Clear Soft tissues: Negative CT CERVICAL SPINE FINDINGS Alignment: No subluxation Skull base and vertebrae: No acute fracture. No primary bone lesion or focal pathologic process. Soft tissues and spinal canal: No prevertebral fluid or swelling. No visible canal hematoma. Disc levels: Prior anterior fusion from C3-C6. Degenerative disc and facet disease throughout the cervical spine. Upper chest: No acute findings Other: None IMPRESSION: No acute intracranial abnormality. No facial or cervical spine fracture. Electronically  Signed   By: Rolm Baptise M.D.   On: 01/18/2018 11:34   Ct Cervical Spine Wo Contrast  Result Date: 01/18/2018 CLINICAL DATA:  Fall, hit head.  On blood thinner EXAM: CT HEAD WITHOUT CONTRAST CT MAXILLOFACIAL WITHOUT CONTRAST CT CERVICAL SPINE WITHOUT CONTRAST TECHNIQUE: Multidetector CT imaging of the head, cervical spine, and maxillofacial structures were performed using the standard protocol without intravenous contrast. Multiplanar CT image reconstructions of the cervical spine and maxillofacial structures were also generated. COMPARISON:  None. FINDINGS: CT HEAD FINDINGS Brain: Mild age related volume loss. No acute intracranial abnormality. Specifically, no hemorrhage, hydrocephalus, mass lesion, acute infarction, or significant intracranial injury. Vascular: No hyperdense vessel or unexpected calcification. Skull: No acute calvarial abnormality. Other: None CT MAXILLOFACIAL FINDINGS Osseous: No fracture or mandibular dislocation. No destructive process. Orbits: Negative. No traumatic or inflammatory finding. Sinuses: Clear Soft tissues: Negative CT CERVICAL SPINE FINDINGS Alignment: No subluxation Skull base and vertebrae: No acute fracture. No primary bone lesion or focal pathologic process. Soft tissues and spinal canal: No prevertebral fluid or swelling. No visible canal hematoma. Disc levels: Prior anterior fusion from C3-C6. Degenerative disc and facet disease throughout the cervical spine. Upper chest: No acute findings Other: None IMPRESSION: No acute intracranial abnormality. No facial or cervical spine fracture. Electronically Signed   By: Rolm Baptise M.D.   On: 01/18/2018 11:34   Dg Pelvis Portable  Result Date: 01/18/2018 CLINICAL DATA:  Trauma secondary to a fall today. EXAM: PORTABLE PELVIS 1-2 VIEWS COMPARISON:  CT scan of the abdomen and pelvis dated 01/12/2018 FINDINGS: No acute abnormalities. Previous SI joint fusion. Previous lower lumbar fusion. Penile prosthesis. IMPRESSION: 1.  No acute abnormalities. 2. Hardware intact. Electronically Signed   By: [name] M.D.   On: 01/18/2018 11:11   Dg Chest Portable 1 View  Result Date: 01/18/2018 CLINICAL DATA:  Bilateral ribcage pain secondary to a fall today. EXAM: PORTABLE CHEST 1 VIEW COMPARISON:  Chest x-rays dated 01/14/2018 and 12/31/2017 and chest CT dated 01/12/2018 FINDINGS: Heart size and pulmonary vascularity are normal. Chronic interstitial disease noted at the lung bases. No acute infiltrates or effusions. No pneumothorax or visible rib fractures or other acute bone abnormality. Left shoulder prosthesis. IMPRESSION: No acute cardiopulmonary findings. Chronic interstitial lung disease. Electronically Signed   By: [name] M.D.   On: 01/18/2018 11:09   Ct Maxillofacial Wo Contrast  Result Date: 01/18/2018 CLINICAL DATA:  Fall, hit head.  On blood thinner EXAM: CT HEAD WITHOUT CONTRAST CT MAXILLOFACIAL WITHOUT CONTRAST CT CERVICAL SPINE WITHOUT CONTRAST TECHNIQUE: Multidetector CT imaging of the head, cervical spine, and maxillofacial structures were performed using the standard protocol without intravenous contrast. Multiplanar CT image reconstructions of  the cervical spine and maxillofacial structures were also generated. COMPARISON:  None. FINDINGS: CT HEAD FINDINGS Brain: Mild age related volume loss. No acute intracranial abnormality. Specifically, no hemorrhage, hydrocephalus, mass lesion, acute infarction, or significant intracranial injury. Vascular: No hyperdense vessel or unexpected calcification. Skull: No acute calvarial abnormality. Other: None CT MAXILLOFACIAL FINDINGS Osseous: No fracture or mandibular dislocation. No destructive process. Orbits: Negative. No traumatic or inflammatory finding. Sinuses: Clear Soft tissues: Negative CT CERVICAL SPINE FINDINGS Alignment: No subluxation Skull base and vertebrae: No acute fracture. No primary bone lesion or focal pathologic process. Soft tissues and spinal  canal: No prevertebral fluid or swelling. No visible canal hematoma. Disc levels: Prior anterior fusion from C3-C6. Degenerative disc and facet disease throughout the cervical spine. Upper chest: No acute findings Other: None IMPRESSION: No acute intracranial abnormality. No facial or cervical spine fracture. Electronically Signed   By: Rolm Baptise M.D.   On: 01/18/2018 11:34    Procedures .Critical Care Performed by: Lennice Sites, DO Authorized by: Lennice Sites, DO   Critical care provider statement:    Critical care time (minutes):  45   Critical care was necessary to treat or prevent imminent or life-threatening deterioration of the following conditions:  Respiratory failure   Critical care was time spent personally by me on the following activities:  Development of treatment plan with patient or surrogate, discussions with primary provider, evaluation of patient's response to treatment, examination of patient, obtaining history from patient or surrogate, ordering and performing treatments and interventions, ordering and review of laboratory studies, pulse oximetry, ordering and review of radiographic studies, re-evaluation of patient's condition and review of old charts   I assumed direction of critical care for this patient from another provider in my specialty: no     (including critical care time)  Medications Ordered in ED Medications  lactated ringers bolus 1,000 mL (0 mLs Intravenous Stopped 01/18/18 1200)  fentaNYL (SUBLIMAZE) injection 50 mcg (50 mcg Intravenous Given 01/18/18 1255)  methylPREDNISolone sodium succinate (SOLU-MEDROL) 125 mg/2 mL injection 125 mg (125 mg Intravenous Given 01/18/18 1255)     Initial Impression / Assessment and Plan / ED Course  I have reviewed the triage vital signs and the nursing notes.  Pertinent labs & imaging results that were available during my care of the patient were reviewed by me and considered in my medical decision making (see  chart for details).     [name] is a 77 year old male with history of atrial fibrillation on Coumadin, idiopathic pulmonary fibrosis, diabetes who presents to the ED with fall, shortness of breath.  Patient with tachycardia upon arrival.  Patient with fall earlier today while ambulating with her walker.  Patient has struggling with shortness of breath over the last several days.  Saw his pulmonologist yesterday and was started on prednisone.  Patient just started oxygen this past weekend and has been wearing 2 to 3 L of oxygen.  Patient continues to have difficulty when ambulating.  He states that he fell and hit his face.  He arrives to the ED somnolent with pulse ox in the 60s on 3 L of oxygen.  Patient was placed on BiPAP after nonrebreather did not help with oxygen status.  Blood gas overall though was reassuring.  Patient had imaging of his head, face, neck that was overall unremarkable.  Has abrasion to his nose.  Chest x-ray showed no rib fracture, no pneumonia.  Chronic lung disease shown on chest x-ray.  Suspect patient with  worsening of his pulmonary fibrosis versus now rib contusion.  Pelvic x-ray unremarkable.  Patient with leukocytosis likely from prednisone use and trauma.  Otherwise lab work showed no significant anemia, electrolyte abnormality.  EKG shows atrial fibrillation with RVR that improved following fluids and BiPAP.  Troponin within normal limits.  INR goal.  Given hypoxic respiratory failure will be admitted for further care.  Patient started on prednisone and breathing treatments.  Suspect pulmonary fibrosis causing symptoms and patient did develop a fever while in the ED and possibly viral process as well.  Given IV fluids.  Admitted to hospitalist for further care.  Given fever and leukocytosis sepsis work-up was broadened with blood cultures, urine studies, lactic acid which are pending at time of handoff to hospitalist. Will defer antibiotics at this time to hospitalist  given no obvious source at this time and suspect possibly viral process.  This chart was dictated using voice recognition software.  Despite best efforts to proofread,  errors can occur which can change the documentation meaning.   Final Clinical Impressions(s) / ED Diagnoses   Final diagnoses:  Acute respiratory failure with hypoxia ([place])  Fall, initial encounter  Fever, unspecified fever cause    ED Discharge Orders    None       Lennice Sites, DO 01/18/18 1408

## 2018-01-18 NOTE — ED Notes (Signed)
Portable xray @bedside

## 2018-01-18 NOTE — H&P (Signed)
History and Physical    JOE GEE CHY:[id number] DOB: [birth year] DOA: 01/18/2018  PCP: Jinny Sanders, MD Consultants:  Lamonte Sakai - pulmonology; Tammi Klippel - rad onc; Tat- neurology; Hochrein - cardiology; Dalldorf - orthopedics Patient coming from: Home - lives with wife; NOK: Wife, [phone]  Chief Complaint: fall, chest pain  HPI: [name] is a 77 y.o. male with medical history significant of HTN; RLS; remote PE; prostate CA: PVD; DM, diet controlled s/p gastric bypass; and afib on Coumadin presenting with a fall and chest pain.   Since hospital d/c on 12/8, he has gotten progressively weaker.  His O2 is not working and they have not come back to fix it.  Yesterday they called pulm and they wanted to see him because he has gotten steroids.  He got steroids in the hospital and seemed to help.  He got 2L home O2 and they allowed her to raise it to 3L but no higher.  She was started on Prednisone yesterday - one dose yesterday.  He has had nothing to eat since last night, small amount of fruit.  His appetite is terrible.  He has pulmonary fibrosis and told they were nothing they could do - but they have read up on would like to try metformin for this.  They report that he was on metformin prior to gastric bypass and he came off metformin and his lungs have worsened since then.  Basically the doctor told them yesterday that they would need a really expensive medication that would cause significant side effects.  He was discharged on [facility] and he is a mouth breather.  He fell this AM after his wife left for work.  He did not break anything.  His friend stopped by and helped him up.  The family is concerned about improving his quality of life.  "Tell doctor I am weak, have no strength.  I must be better before I leave."   ED Course: Has IPF, saw pulm yesterday, on 2-3L O2.  Hypoxia with walking, fell today likely related to hypoxia.  On NRB 15L on arrival, 91%.  Placed on BIPAP, now 60% FiO2 on  BIPAP.  O2 just started last week.  T 100.4 - ?viral process in addition to fibrosis vs. Progressive disease.  Review of Systems: unable to perform   Ambulatory Status:  Ambulated without assistance since recovery from knee replacement but he has had progressive decline since late October   Past Medical History:  Diagnosis Date  . Arthritis    hands   . Asthma   . Chronic atrial fibrillation    a.fib on warfarin- Dr. Percival Spanish follows  . Diverticulosis of colon (without mention of hemorrhage)   . ED (erectile dysfunction)   . Fibromyalgia   . GERD (gastroesophageal reflux disease)   . Hard of hearing    wears hearing aids  . Headache   . History of blood clots    behind right knee and then went into left lung 59yrs ago  . History of colon polyps   . History of kidney stones   . Hx of diabetes mellitus    "no longer diabetic since gastric bypass" - no longer taking metformin"  in 2 months "problems with low blood sugar now"  . Hx of gout    but doesn't take any meds  . Inflammation of colonic mucosa    recent admission and release from [facility] 02-28-15-remains on oral antibiotic  . IPF (idiopathic pulmonary fibrosis) ([place])   .  Obesity   . Peripheral vascular disease ([place])   . Pneumonia    last time about 66yrs ago  . Prostate cancer ([place])   . Pulmonary embolism ([place])    over 10 yrs ago "many years ago"  . Restless legs syndrome (RLS)   . Skin cancer   . Unspecified asthma(493.90)    as a child  . Unspecified essential hypertension    hx of no longer on medication due to gastric bypass     Past Surgical History:  Procedure Laterality Date  . ANTERIOR CERVICAL DECOMP/DISCECTOMY FUSION N/A 05/09/2013   Procedure: ANTERIOR CERVICAL DECOMPRESSION/DISCECTOMY FUSION CERVICAL THREE-FOUR,CERVICAL FOUR-FIVE,CERVICAL FIVE-SIX;  Surgeon: Floyce Stakes, MD;  Location: [facility]URO [facility]S;  Service: Neurosurgery;  Laterality: N/A;  . ARTERY REPAIR     Left forearm  . BACK SURGERY     x 3    . BREATH TEK H PYLORI  01/08/2011   Procedure: BREATH TEK H PYLORI;  Surgeon: Pedro Earls, MD;  Location: [facility];  Service: General;  Laterality: N/A;  . CARDIOVERSION     x 2 attempts-unsuccessful.  [name] CATARACT EXTRACTION, BILATERAL    . COLONOSCOPY    . CYSTOSCOPY    . ESOPHAGOGASTRODUODENOSCOPY (EGD) WITH PROPOFOL N/A 09/21/2014   Procedure: ESOPHAGOGASTRODUODENOSCOPY (EGD) WITH PROPOFOL;  Surgeon: Manya Silvas, MD;  Location: [facility] ENDOSCOPY;  Service: Endoscopy;  Laterality: N/A;  . EYE SURGERY     cataract bil  . FOOT SURGERY     Left foot   . GASTRIC ROUX-EN-Y  08/11/2011   Procedure: LAPAROSCOPIC ROUX-EN-Y GASTRIC BYPASS WITH UPPER ENDOSCOPY;  Surgeon: Pedro Earls, MD;  Location: [facility] ORS;  Service: General;  Laterality: N/A;  . HAND SURGERY     LEFT  . injections in back     x 18  . JOINT REPLACEMENT    . KNEE SURGERY Left    x 4  . KNEE SURGERY Left    arthroscopy  . LAPAROSCOPIC INTERNAL HERNIA REPAIR N/A 03/08/2015   Procedure: LAPAROSCOPIC INTERNAL HERNIA REPAIR ;  Surgeon: [name], MD;  Location: [facility] ORS;  Service: General;  Laterality: N/A;  . LEG SURGERY     FOR NECROTIZING FASCITIS L LEG AND GROIN  . PANNICULECTOMY N/A 08/25/2016   Procedure: PANNICULECTOMY;  Surgeon: [name], MD;  Location: [facility] ORS;  Service: General;  Laterality: N/A;  . PENILE PROSTHESIS IMPLANT N/A 07/02/2014   Procedure: PENILE PROTHESIS INFLATABLE 3 PIECE (COLOPLAST) SCROTAL APPROACH;  Surgeon: Kathie Rhodes, MD;  Location: [facility] ORS;  Service: Urology;  Laterality: N/A;  . PENILE PROSTHESIS IMPLANT N/A 11/23/2014   Procedure: EXPLORATION AND REVISION OF PENILE PROSTHESIS;  Surgeon: Kathie Rhodes, MD;  Location: [facility] ORS;  Service: Urology;  Laterality: N/A;  . PICC INSERTION W/OUT PORT/PUMP    . REPLACEMENT TOTAL KNEE Left    x 2  . SHOULDER ARTHROSCOPY    . SHOULDER SURGERY Left 2014  . TONSILLECTOMY    . TOTAL KNEE ARTHROPLASTY Right 04/27/2017  . TOTAL KNEE  ARTHROPLASTY Right 04/27/2017   Procedure: TOTAL KNEE ARTHROPLASTY;  Surgeon: Melrose Nakayama, MD;  Location: [place];  Service: Orthopedics;  Laterality: Right;  . TOTAL SHOULDER ARTHROPLASTY Left 01/26/2013   Procedure: TOTAL SHOULDER ARTHROPLASTY;  Surgeon: Nita Sells, MD;  Location: [place];  Service: Orthopedics;  Laterality: Left;  Left total shoulder arthroplasty    Social History   Socioeconomic History  . Marital status: Married    Spouse name: Not on file  .  Number of children: 1  . Years of education: Not on file  . Highest education level: Not on file  Occupational History  . Occupation: Retired    Comment: Investment banker, operational  . Financial resource strain: Not on file  . Food insecurity:    Worry: Not on file    Inability: Not on file  . Transportation needs:    Medical: Not on file    Non-medical: Not on file  Tobacco Use  . Smoking status: Never Smoker  . Smokeless tobacco: Never Used  Substance and Sexual Activity  . Alcohol use: No    Alcohol/week: 0.0 standard drinks  . Drug use: No  . Sexual activity: Yes  Lifestyle  . Physical activity:    Days per week: Not on file    Minutes per session: Not on file  . Stress: Not on file  Relationships  . Social connections:    Talks on phone: Not on file    Gets together: Not on file    Attends religious service: Not on file    Active member of club or organization: Not on file    Attends meetings of clubs or organizations: Not on file    Relationship status: Not on file  . Intimate partner violence:    Fear of current or ex partner: Not on file    Emotionally abused: Not on file    Physically abused: Not on file    Forced sexual activity: Not on file  Other Topics Concern  . Not on file  Social History Narrative   DIET: 3 meals, F&V, some water, crystal light, No fast food   Exercise: walks treadmill 30 min      1 Caffeine drinks daily       No living will.           Allergies    Allergen Reactions  . Sulfacetamide Sodium Swelling    throat swelling  . Latex Itching    Severe contact dermatitis  . Adhesive [Tape] Other (See Comments)    Tears skin-- "No tape of any kind, they all tear skin right off"    Family History  Problem Relation Age of Onset  . Uterine cancer Mother        mets  . Breast cancer Mother   . Cancer Mother        cervical  . Emphysema Father   . Alzheimer's disease Sister   . Prostate cancer Brother   . Heart attack Brother   . Colon cancer Brother 31    Prior to Admission medications   Medication Sig Start Date End Date Taking? Authorizing Provider  acarbose (PRECOSE) 50 MG tablet Take 50 mg by mouth 3 (three) times daily with meals.   Yes [provider]  atorvastatin (LIPITOR) 80 MG tablet Take 1 tablet (80 mg total) by mouth at bedtime. 02/18/17  Yes Minus Breeding, MD  benzonatate (TESSALON) 200 MG capsule Take 1 capsule (200 mg total) by mouth 3 (three) times daily as needed for cough. 01/14/18  Yes Sheikh, Omair Latif, DO  chlorpheniramine-HYDROcodone (TUSSIONEX) 10-8 MG/5ML SUER Take 5 mLs by mouth every 12 (twelve) hours as needed for cough. Patient taking differently: Take 1.5 mLs by mouth every 12 (twelve) hours as needed for cough.  01/05/18  Yes Lesleigh Noe, MD  FLUoxetine (PROZAC) 20 MG capsule TAKE 3 CAPSULES BY MOUTH EVERYDAY AT BEDTIME Patient taking differently: Take 60 mg by mouth at bedtime.  01/10/18  Yes Bedsole, Amy E, MD  furosemide (LASIX) 20 MG tablet Take 1 tablet (20 mg total) by mouth every other day. Patient taking differently: Take 20 mg by mouth daily.  08/10/17  Yes Duke, Tami Lin, PA  gabapentin (NEURONTIN) 300 MG capsule TAKE 1 CAPSULE BY MOUTH TWICE A DAY Patient taking differently: Take 300 mg by mouth 2 (two) times daily.  08/19/17  Yes Tat, Eustace Quail, DO  hydrocortisone 2.5 % cream Apply 1 application topically 2 (two) times daily as needed (itching).  12/09/17  Yes [provider]  hydroxypropyl methylcellulose / hypromellose (ISOPTO TEARS / GONIOVISC) 2.5 % ophthalmic solution Place 1 drop into both eyes 2 (two) times daily as needed for dry eyes.   Yes [provider]  Omega-3 Fatty Acids (FISH OIL) 1000 MG CAPS Take 1,000 mg by mouth daily.   Yes [provider]  omeprazole (PRILOSEC) 40 MG capsule TAKE 1 CAPSULE (40 MG TOTAL) BY MOUTH DAILY. Patient taking differently: Take 40 mg by mouth at bedtime.  11/11/17  Yes Bedsole, Amy E, MD  predniSONE (DELTASONE) 10 MG tablet 4 tabs x 4 days, 3 tabs x 4 days; 2 tabs x 4 days; 1 tab x 4 days 01/17/18  Yes Martyn Ehrich, NP  PRESCRIPTION MEDICATION Apply 1 g topically 4 (four) times daily as needed (pain). Baclofen/Gabapentin/Lidocaine Compund Cream   Yes [provider]  primidone (MYSOLINE) 50 MG tablet 150 mg in the morning, 100 mg at night Patient taking differently: Take 100-150 mg by mouth See admin instructions. 150 mg in the morning, 100 mg at night 03/08/17  Yes Tat, Eustace Quail, DO  ranitidine (ZANTAC) 300 MG tablet Take 300 mg by mouth at bedtime.  12/03/17  Yes [provider]  sucralfate (CARAFATE) 1 g tablet Take 1 g by mouth 3 (three) times daily before meals.  12/21/17  Yes [provider]  tamsulosin (FLOMAX) 0.4 MG CAPS capsule Take 0.4 mg by mouth at bedtime.  01/03/18  Yes [provider]  tiZANidine (ZANAFLEX) 4 MG tablet Take 4 mg by mouth every 6 (six) hours as needed for muscle spasms.  12/20/17  Yes [provider]  vitamin E 400 UNIT capsule Take 400 Units by mouth daily.   Yes [provider]  warfarin (COUMADIN) 2.5 MG tablet Take 2.5 mg by mouth daily. Take 2.5mg  SUN and THUR and 3.75mg  on all other days.   Yes [provider]  zolpidem (AMBIEN CR) 12.5 MG CR tablet Take 12.5 mg by mouth at bedtime.   Yes [provider]  Fluticasone-Salmeterol (ADVAIR DISKUS) 250-50 MCG/DOSE AEPB Inhale 1 puff into  the lungs 2 (two) times daily. Patient not taking: Reported on 01/18/2018 12/21/17   Jinny Sanders, MD  ipratropium-albuterol (DUONEB) 0.5-2.5 (3) MG/3ML SOLN Take 3 mLs by nebulization every 6 (six) hours as needed. Patient taking differently: Take 3 mLs by nebulization every 6 (six) hours as needed (shortness of breath).  01/17/18   Martyn Ehrich, NP    Physical Exam: Vitals:   01/18/18 1345 01/18/18 1415 01/18/18 1430 01/18/18 1445  BP: 93/77 98/78 100/77 99/77  Pulse: (!) 133 (!) 106 95 (!) 121  Resp: (!) 28 (!) 27 (!) 22 (!) 25  Temp:      TempSrc:      SpO2: 98% 97% 98% 97%     General:  Appears calm and comfortable with mild tachypnea on BIPAP; he is alert but has marked difficulty communicating due  to his respiratory failure Eyes:  PERRL, EOMI, normal lids, iris ENT:  BIPAP in place, dried blood along his face under the mask from his fall Neck:  no LAD, masses or thyromegaly Cardiovascular:  RR with mild tachycardia, no m/r/g. No LE edema.  Respiratory:   CTA bilaterally with no wheezes/rales/rhonchi.  Mildly increased respiratory effort on BIPAP Abdomen:  soft, NT, ND, NABS Skin:  no rash or induration seen on limited exam Musculoskeletal:  grossly normal tone BUE/BLE, good ROM, no bony abnormality Lower extremity:  No LE edema.  Limited foot exam with no ulcerations.  2+ distal pulses. Psychiatric:blunted mood and affect, alert but difficulty communicating due to respiratory failure Neurologic: unable to perform    Radiological Exams on Admission: Ct Head Wo Contrast  Result Date: 01/18/2018 CLINICAL DATA:  Fall, hit head.  On blood thinner EXAM: CT HEAD WITHOUT CONTRAST CT MAXILLOFACIAL WITHOUT CONTRAST CT CERVICAL SPINE WITHOUT CONTRAST TECHNIQUE: Multidetector CT imaging of the head, cervical spine, and maxillofacial structures were performed using the standard protocol without intravenous contrast. Multiplanar CT image reconstructions of the cervical spine and  maxillofacial structures were also generated. COMPARISON:  None. FINDINGS: CT HEAD FINDINGS Brain: Mild age related volume loss. No acute intracranial abnormality. Specifically, no hemorrhage, hydrocephalus, mass lesion, acute infarction, or significant intracranial injury. Vascular: No hyperdense vessel or unexpected calcification. Skull: No acute calvarial abnormality. Other: None CT MAXILLOFACIAL FINDINGS Osseous: No fracture or mandibular dislocation. No destructive process. Orbits: Negative. No traumatic or inflammatory finding. Sinuses: Clear Soft tissues: Negative CT CERVICAL SPINE FINDINGS Alignment: No subluxation Skull base and vertebrae: No acute fracture. No primary bone lesion or focal pathologic process. Soft tissues and spinal canal: No prevertebral fluid or swelling. No visible canal hematoma. Disc levels: Prior anterior fusion from C3-C6. Degenerative disc and facet disease throughout the cervical spine. Upper chest: No acute findings Other: None IMPRESSION: No acute intracranial abnormality. No facial or cervical spine fracture. Electronically Signed   By: Rolm Baptise M.D.   On: 01/18/2018 11:34   Ct Cervical Spine Wo Contrast  Result Date: 01/18/2018 CLINICAL DATA:  Fall, hit head.  On blood thinner EXAM: CT HEAD WITHOUT CONTRAST CT MAXILLOFACIAL WITHOUT CONTRAST CT CERVICAL SPINE WITHOUT CONTRAST TECHNIQUE: Multidetector CT imaging of the head, cervical spine, and maxillofacial structures were performed using the standard protocol without intravenous contrast. Multiplanar CT image reconstructions of the cervical spine and maxillofacial structures were also generated. COMPARISON:  None. FINDINGS: CT HEAD FINDINGS Brain: Mild age related volume loss. No acute intracranial abnormality. Specifically, no hemorrhage, hydrocephalus, mass lesion, acute infarction, or significant intracranial injury. Vascular: No hyperdense vessel or unexpected calcification. Skull: No acute calvarial abnormality.  Other: None CT MAXILLOFACIAL FINDINGS Osseous: No fracture or mandibular dislocation. No destructive process. Orbits: Negative. No traumatic or inflammatory finding. Sinuses: Clear Soft tissues: Negative CT CERVICAL SPINE FINDINGS Alignment: No subluxation Skull base and vertebrae: No acute fracture. No primary bone lesion or focal pathologic process. Soft tissues and spinal canal: No prevertebral fluid or swelling. No visible canal hematoma. Disc levels: Prior anterior fusion from C3-C6. Degenerative disc and facet disease throughout the cervical spine. Upper chest: No acute findings Other: None IMPRESSION: No acute intracranial abnormality. No facial or cervical spine fracture. Electronically Signed   By: Rolm Baptise M.D.   On: 01/18/2018 11:34   Dg Pelvis Portable  Result Date: 01/18/2018 CLINICAL DATA:  Trauma secondary to a fall today. EXAM: PORTABLE PELVIS 1-2 VIEWS COMPARISON:  CT scan of the abdomen and  pelvis dated 01/12/2018 FINDINGS: No acute abnormalities. Previous SI joint fusion. Previous lower lumbar fusion. Penile prosthesis. IMPRESSION: 1. No acute abnormalities. 2. Hardware intact. Electronically Signed   By: [name] M.D.   On: 01/18/2018 11:11   Dg Chest Portable 1 View  Result Date: 01/18/2018 CLINICAL DATA:  Bilateral ribcage pain secondary to a fall today. EXAM: PORTABLE CHEST 1 VIEW COMPARISON:  Chest x-rays dated 01/14/2018 and 12/31/2017 and chest CT dated 01/12/2018 FINDINGS: Heart size and pulmonary vascularity are normal. Chronic interstitial disease noted at the lung bases. No acute infiltrates or effusions. No pneumothorax or visible rib fractures or other acute bone abnormality. Left shoulder prosthesis. IMPRESSION: No acute cardiopulmonary findings. Chronic interstitial lung disease. Electronically Signed   By: [name] M.D.   On: 01/18/2018 11:09   Ct Maxillofacial Wo Contrast  Result Date: 01/18/2018 CLINICAL DATA:  Fall, hit head.  On blood thinner  EXAM: CT HEAD WITHOUT CONTRAST CT MAXILLOFACIAL WITHOUT CONTRAST CT CERVICAL SPINE WITHOUT CONTRAST TECHNIQUE: Multidetector CT imaging of the head, cervical spine, and maxillofacial structures were performed using the standard protocol without intravenous contrast. Multiplanar CT image reconstructions of the cervical spine and maxillofacial structures were also generated. COMPARISON:  None. FINDINGS: CT HEAD FINDINGS Brain: Mild age related volume loss. No acute intracranial abnormality. Specifically, no hemorrhage, hydrocephalus, mass lesion, acute infarction, or significant intracranial injury. Vascular: No hyperdense vessel or unexpected calcification. Skull: No acute calvarial abnormality. Other: None CT MAXILLOFACIAL FINDINGS Osseous: No fracture or mandibular dislocation. No destructive process. Orbits: Negative. No traumatic or inflammatory finding. Sinuses: Clear Soft tissues: Negative CT CERVICAL SPINE FINDINGS Alignment: No subluxation Skull base and vertebrae: No acute fracture. No primary bone lesion or focal pathologic process. Soft tissues and spinal canal: No prevertebral fluid or swelling. No visible canal hematoma. Disc levels: Prior anterior fusion from C3-C6. Degenerative disc and facet disease throughout the cervical spine. Upper chest: No acute findings Other: None IMPRESSION: No acute intracranial abnormality. No facial or cervical spine fracture. Electronically Signed   By: Rolm Baptise M.D.   On: 01/18/2018 11:34    EKG: Independently reviewed.  Afib with rate 137; prolonged QT 573; nonspecific ST changes which are likely rate-related   Labs on Admission: I have personally reviewed the available labs and imaging studies at the time of the admission.  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)   Anticoagulant long-term use   Malignant neoplasm of prostate (HCC)   Pulmonary fibrosis (HCC)   CKD (chronic kidney disease) stage 3, GFR 30-59 ml/min (HCC)   Acute on chronic respiratory failure with hypoxia related to IPF -Patient presented with O2 sats into the 70s on NRB -He was placed on BIPAP with improvement and is now more comfortable in appearance -Patient had a respiratory illness leading up to onset of symptoms several months ago and has had very rapid progression -He was hospitalized from 12/4-7 with exacerbation; he was placed on high-dose steroids and then tapered and was also treated with aggresive bronchodilators -He was also found to have a tiny subsegmental PE in the LUL despite  Coumadin, but this was thought to be inconsequential compared to his more serious lung issue (family was unable to afford  Lovenox or Eliquis and so Coumadin was continued) -He was diagnosed with chronic O2-dependent respiratory failure and discharged on 2L at home and this was increased yesterday to 3L -He saw Geraldo Pitter, NP, at the pulm clinic yesterday and there was concern that his symptoms are related to disease progression rather than flare; he was given prednisone again and they started discussion about anti-fibrotic medicaiton -With his worsening symptoms despite treatment, this is concerning for disease progression; If this is not an exacerbation, then his disease has begun the rapid progression phase and palliative care or even Hospice may be reasonable. -Ideally, this is an exacerbation since that would be expected to respond to treatment.   -Temp is 100.4; no other obvious evidence of infection.  Blood and urine cultures are pending. -Will treat with Duonebs, albuterol prn, and high-dose corticosteroids for now. -Will request pulmonary consult; Dr. Nelda Marseille to  see.  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 and PSA 5.58; he is scheduled for radiation therapy for prostate CA in January -Also incidentally noted to have asymmetric irregular wall thickening of distal esophagus on 12/4 CT, concerning for neoplasm, with mild mediastinal and hilar LAD; he is in no condition for evaluation/treatment of this issue at this time and may need outpatient f/u  DM -A1c was 6.6 on 9/5 -He is taking acarbose -There is likely little harm in changing to metformin as requested by family (case report of improvement in IPF with this medication) but it is unlikely to provide significant benefit -For now, treating with high-dose steroids and so will add moderate-scale SSI coverage  Stage 3 CKD -Appears to be stable at this time   DVT prophylaxis: Heparin Code Status:  DNR - confirmed with family Family Communication: Wife and daughter present throughout evaluation Disposition Plan:  Home once clinically improved Consults called: Pulmonology  Admission status: Admit - It is my clinical opinion that admission to INPATIENT is reasonable and necessary because of the expectation that this patient will require hospital care that crosses at least 2 midnights to treat this condition based on the medical complexity of the problems presented.  Given the aforementioned information, the predictability of an adverse outcome is felt to be significant.    Karmen Bongo MD [facility]  If note is complete, please contact covering daytime or nighttime physician. [email or web address] Password [email or web address]  01/18/2018, 3:59 PM

## 2018-01-18 NOTE — Progress Notes (Signed)
ANTICOAGULATION CONSULT NOTE - Initial Consult  Pharmacy Consult for Heparin when INR <2 Indication: atrial fibrillation and pulmonary embolus (dx 12/4)  Allergies  Allergen Reactions  . Sulfacetamide Sodium Swelling    throat swelling  . Latex Itching    Severe contact dermatitis  . Adhesive [Tape] Other (See Comments)    Tears skin-- "No tape of any kind, they all tear skin right off"    Patient Measurements:   Heparin Dosing Weight: 80 kg  Vital Signs: Temp: 100.4 F (38 C) (12/10 1302) Temp Source: Rectal (12/10 1302) BP: 99/77 (12/10 1445) Pulse Rate: 82 (12/10 1544)  Labs: Recent Labs    01/17/18 1728 01/18/18 1045  HGB  --  12.2*  HCT  --  39.8  PLT  --  200  LABPROT 27.5* 28.0*  INR 2.4* 2.66  CREATININE  --  1.53*    Estimated Creatinine Clearance: 45.1 mL/min (A) (by C-G formula based on SCr of 1.53 mg/dL (H)).   Medical History: Past Medical History:  Diagnosis Date  . Arthritis    hands   . Asthma   . Chronic atrial fibrillation    a.fib on warfarin- Dr. Percival Spanish follows  . Diverticulosis of colon (without mention of hemorrhage)   . ED (erectile dysfunction)   . Fibromyalgia   . GERD (gastroesophageal reflux disease)   . Hard of hearing    wears hearing aids  . Headache   . History of blood clots    behind right knee and then went into left lung 36yrs ago  . History of colon polyps   . History of kidney stones   . Hx of diabetes mellitus    "no longer diabetic since gastric bypass" - no longer taking metformin"  in 2 months "problems with low blood sugar now"  . Hx of gout    but doesn't take any meds  . Inflammation of colonic mucosa    recent admission and release from [facility] 02-28-15-remains on oral antibiotic  . IPF (idiopathic pulmonary fibrosis) ([place])   . Obesity   . Peripheral vascular disease ([place])   . Pneumonia    last time about 66yrs ago  . Prostate cancer ([place])   . Pulmonary embolism ([place])    over 10 yrs ago "many years  ago"  . Restless legs syndrome (RLS)   . Skin cancer   . Unspecified asthma(493.90)    as a child  . Unspecified essential hypertension    hx of no longer on medication due to gastric bypass     Medications:  Medications Prior to Admission  Medication Sig Dispense Refill Last Dose  . acarbose (PRECOSE) 50 MG tablet Take 50 mg by mouth 3 (three) times daily with meals.   01/17/2018 at Unknown time  . atorvastatin (LIPITOR) 80 MG tablet Take 1 tablet (80 mg total) by mouth at bedtime. 90 tablet 3 01/17/2018 at Unknown time  . benzonatate (TESSALON) 200 MG capsule Take 1 capsule (200 mg total) by mouth 3 (three) times daily as needed for cough. 20 capsule 0 01/17/2018 at Unknown time  . chlorpheniramine-HYDROcodone (TUSSIONEX) 10-8 MG/5ML SUER Take 5 mLs by mouth every 12 (twelve) hours as needed for cough. (Patient taking differently: Take 1.5 mLs by mouth every 12 (twelve) hours as needed for cough. ) 140 mL 0 Past Week at Unknown time  . FLUoxetine (PROZAC) 20 MG capsule TAKE 3 CAPSULES BY MOUTH EVERYDAY AT BEDTIME (Patient taking differently: Take 60 mg by mouth at bedtime. )  270 capsule 1 01/17/2018 at Unknown time  . furosemide (LASIX) 20 MG tablet Take 1 tablet (20 mg total) by mouth every other day. (Patient taking differently: Take 20 mg by mouth daily. ) 180 tablet 1 01/17/2018 at Unknown time  . gabapentin (NEURONTIN) 300 MG capsule TAKE 1 CAPSULE BY MOUTH TWICE A DAY (Patient taking differently: Take 300 mg by mouth 2 (two) times daily. ) 180 capsule 1 01/17/2018 at Unknown time  . hydrocortisone 2.5 % cream Apply 1 application topically 2 (two) times daily as needed (itching).   2 Past Week at Unknown time  . hydroxypropyl methylcellulose / hypromellose (ISOPTO TEARS / GONIOVISC) 2.5 % ophthalmic solution Place 1 drop into both eyes 2 (two) times daily as needed for dry eyes.   Past Week at Unknown time  . Omega-3 Fatty Acids (FISH OIL) 1000 MG CAPS Take 1,000 mg by mouth daily.   01/17/2018  at Unknown time  . omeprazole (PRILOSEC) 40 MG capsule TAKE 1 CAPSULE (40 MG TOTAL) BY MOUTH DAILY. (Patient taking differently: Take 40 mg by mouth at bedtime. ) 90 capsule 1 01/17/2018 at Unknown time  . predniSONE (DELTASONE) 10 MG tablet 4 tabs x 4 days, 3 tabs x 4 days; 2 tabs x 4 days; 1 tab x 4 days 40 tablet 0 01/17/2018 at Unknown time  . PRESCRIPTION MEDICATION Apply 1 g topically 4 (four) times daily as needed (pain). Baclofen/Gabapentin/Lidocaine Compund Cream   01/17/2018 at Unknown time  . primidone (MYSOLINE) 50 MG tablet 150 mg in the morning, 100 mg at night (Patient taking differently: Take 100-150 mg by mouth See admin instructions. 150 mg in the morning, 100 mg at night) 450 tablet 1 01/17/2018 at Unknown time  . ranitidine (ZANTAC) 300 MG tablet Take 300 mg by mouth at bedtime.   3 01/17/2018 at Unknown time  . sucralfate (CARAFATE) 1 g tablet Take 1 g by mouth 3 (three) times daily before meals.   6 01/18/2018 at Unknown time  . tamsulosin (FLOMAX) 0.4 MG CAPS capsule Take 0.4 mg by mouth at bedtime.    01/17/2018 at Unknown time  . tiZANidine (ZANAFLEX) 4 MG tablet Take 4 mg by mouth every 6 (six) hours as needed for muscle spasms.    Past Month at Unknown time  . vitamin E 400 UNIT capsule Take 400 Units by mouth daily.   01/17/2018 at Unknown time  . warfarin (COUMADIN) 2.5 MG tablet Take 2.5 mg by mouth daily. Take 2.5mg  SUN and THUR and 3.75mg  on all other days.   01/17/2018 at 2000  . zolpidem (AMBIEN CR) 12.5 MG CR tablet Take 12.5 mg by mouth at bedtime.   01/17/2018 at Unknown time  . ipratropium-albuterol (DUONEB) 0.5-2.5 (3) MG/3ML SOLN Take 3 mLs by nebulization every 6 (six) hours as needed. (Patient taking differently: Take 3 mLs by nebulization every 6 (six) hours as needed (shortness of breath). ) 360 mL 0     Assessment: 77 yo M with recent admission 12/4 > 12/8 for mgmt of pulm disease.  Noted at that time to have new PE on CT scan.  Pt was on Coumadin with therapeutic  INR at that time but declined LMWH and was unable to afford Eliqus/Xarelto.  Pt continues on Coumadin.  INR therapeutic today.  Pt returns to ED following a fall with cc: weakness, SOB, hypoxia.  Pharmacy has been asked to initiate heparin when INR <2 for recent PE and hx afib.     Goal of  Therapy:  Heparin level 0.3-0.7 units/ml Monitor platelets by anticoagulation protocol: Yes   Plan:  No heparin today. Follow-up with INR in AM.  Horton Chin, Pharm.D., BCPS Clinical Pharmacist  **Pharmacist phone directory can now be found on [email or web address] ([email or web address]).  Listed under [place].  01/18/2018 5:07 PM

## 2018-01-18 NOTE — ED Notes (Signed)
ED Provider at bedside. 

## 2018-01-18 NOTE — ED Notes (Signed)
Patient placed on NRB 15L O2 d/t O2 sat 70s

## 2018-01-18 NOTE — Consult Note (Addendum)
NAME:  [name], MRN:  [id number], DOB:  Mar 08, 1940, LOS: 0 ADMISSION DATE:  01/18/2018, CONSULTATION DATE:  12/10 REFERRING MD:  Lorin Mercy, CHIEF COMPLAINT:  Acute on chronic hypoxic respiratory failure    Brief History   77 year old male patient with relatively new dx of chronic respiratory failure secondary to pulmonary fibrosis, UIP pattern.  Recent pulmonary emboli, while on Coumadin.  Presented to the emergency room 12/10 after a fall.  Found to be hypoxic with saturations in the 70s pulmonary asked to evaluate.  History of present illness   77 year old male patient who has a significant medical history as mentioned below recently  referred to [facility] pulmonary with new diagnosis of pulmonary fibrosis.  We had seen him in consult at [facility] long on 12/4 for abnormal CT findings as well as pulmonary emboli.  We felt that the pulmonary emboli were small, and there is pulmonary fibrosis.  The overwhelming issue driving his hypoxia.  He was ultimately discharged to home with plan for follow-up in our clinic, prednisone taper, and then referral to ILD clinic.  He was discharged home on supplemental oxygen and supposed to have been on a prednisone taper which he did not get.  The prednisone taper was started in our clinic on hospital follow-up today.  He had been doing quite poorly with marked work of breathing at home on 2 L increasing to 3 L without assistance.  The prednisone was started initially on 12/9.  He presented to the emergency room today on 12/10 after a fall.  He reported he had not tripped or anything but essentially had become quite dizzy resulting in the fall.  He was brought to the emergency room for further evaluation, found to have pulse oximetry in the 70s on 2 L nasal cannula, he was weak, fatigued, slow to respond.  Oxygen was titrated up ultimately up to NIPPV.  Pulmonary asked to assist with acute hypoxic respiratory failure  Past Medical History  Prostate cancer (currently  on coumadin but has been coumadin failure), PE, possible COPD and newly identified pulmonary fibrosis.   Significant Hospital Events     Consults:  Pulmonary  Procedures:    Significant Diagnostic Tests:  CT imaging of C-spine, maxillofacial and head.  All negative for acute fracture or trauma  Micro Data:   Antimicrobials:    Interim history/subjective:  Feels better on BiPAP  Objective   Blood pressure 99/77, pulse (Abnormal) 121, temperature (Abnormal) 100.4 F (38 C), temperature source Rectal, resp. rate (Abnormal) 25, SpO2 97 %.       No intake or output data in the 24 hours ending 01/18/18 1550 There were no vitals filed for this visit.  Examination: General: Frail 77 year old male lethargic, but awake and oriented HENT: BiPAP mask in place Lungs: Diminished throughout Cardiovascular: Regular rate and Abdomen: Soft non-tender GU: Voids Neuro: Awake oriented Extremity: Warm dry no significant edema Resolved Hospital Problem list     Assessment & Plan:  Acute on chronic versus chronic hypoxic respiratory failure in the setting of advanced pulmonary fibrosis/UIP -Diagnostic data today: Rheumatoid factor elevated at 14, CPP antibodies negative, sed rate 22, ANA negative ENA antibody IFA positive. -Has referral to ILD clinic pending -Had not been getting prednisone since discharge from the hospital, apparently had clinical response to this Plan Supplemental oxygen weaning for saturations greater than 88% Post high-dose steroids IV diuresis Full DNR May benefit from palliative care consult We can consider metformin if renal function improves  Recent pulmonary  emboli, these were small and unlikely contributing to his hypoxia Plan Heparin per pharmacy Need to consider low molecular weight heparin at discharge given Coumadin failure  History of atrial fibrillation Plan Telemetry monitoring Anticoagulation Continue calcium channel blocker  Prostate  cancer Plan Outpatient follow-up if appropriate  Diabetes Plan Sliding scale insulin Metformin as mentioned above if renal function improved/stable  Stage III chronic kidney disease Plan Trend chemistries renal dose medications as appropriate   Best practice:  Diet:  N.p.o. except meds  pain/Anxiety/Delirium protocol (if indicated): Not applicable VAP protocol (if indicated): Not applicable DVT prophylaxis: IV heparin GI prophylaxis: Not applicable Glucose control: Sliding scale insulin Mobility: Bedrest Code Status: DO NOT RESUSCITATE Family Communication: Wife updated Disposition: Stepdown unit  Labs   CBC: Recent Labs  Lab 01/12/18 1508 01/13/18 0351 01/14/18 0440 01/15/18 0514 01/18/18 1045  WBC 9.7 7.8 17.6* 14.0* 21.7*  NEUTROABS 6.4  --  15.9* 11.0* 18.4*  HGB 11.2* 11.4* 11.3* 10.7* 12.2*  HCT 35.6* 35.3* 35.0* 34.0* 39.8  MCV 93.2 91.5 93.6 95.2 92.6  PLT 309 280 277 293 242    Basic Metabolic Panel: Recent Labs  Lab 01/12/18 1508 01/13/18 0351 01/14/18 0440 01/15/18 0514 01/18/18 1045  NA 135 135 132* 137 135  K 4.6 4.4 4.2 4.5 4.5  CL 100 101 100 103 99  CO2 26 21* 22 26 22   GLUCOSE 140* 204* 307* 205* 230*  BUN 26* 27* 33* 36* 28*  CREATININE 1.46* 1.40* 1.43* 1.47* 1.53*  CALCIUM 8.3* 8.2* 8.2* 8.3* 8.6*  MG  --   --  2.2 2.4 1.9  PHOS  --   --  3.2 3.0  --    GFR: Estimated Creatinine Clearance: 45.1 mL/min (A) (by C-G formula based on SCr of 1.53 mg/dL (H)). Recent Labs  Lab 01/13/18 0351 01/14/18 0440 01/15/18 0514 01/18/18 1045 01/18/18 1507  WBC 7.8 17.6* 14.0* 21.7*  --   LATICACIDVEN  --   --   --   --  1.10    Liver Function Tests: Recent Labs  Lab 01/12/18 1508 01/14/18 0440 01/15/18 0514 01/18/18 1045  AST 24 25 21 28   ALT 14 17 17 19   ALKPHOS 74 79 79 86  BILITOT 0.5 0.4 0.5 0.8  PROT 6.7 6.4* 6.0* 6.5  ALBUMIN 3.0* 2.9* 2.8* 2.7*   No results for input(s): LIPASE, AMYLASE in the last 168 hours. No  results for input(s): AMMONIA in the last 168 hours.  ABG    Component Value Date/Time   PHART 7.428 01/18/2018 1056   PCO2ART 32.1 01/18/2018 1056   PO2ART 118.0 (H) 01/18/2018 1056   HCO3 21.2 01/18/2018 1056   TCO2 22 01/18/2018 1056   ACIDBASEDEF 2.0 01/18/2018 1056   O2SAT 99.0 01/18/2018 1056     Coagulation Profile: Recent Labs  Lab 01/13/18 0351 01/14/18 0440 01/15/18 0514 01/17/18 1728 01/18/18 1045  INR 2.56 2.86 3.85 2.4* 2.66    Cardiac Enzymes: No results for input(s): CKTOTAL, CKMB, CKMBINDEX, TROPONINI in the last 168 hours.  HbA1C: Hgb A1c MFr Bld  Date/Time Value Ref Range Status  10/14/2017 07:52 AM 6.6 (H) 4.6 - 6.5 % Final    Comment:    Glycemic Control Guidelines for People with Diabetes:Non Diabetic:  <6%Goal of Therapy: <7%Additional Action Suggested:  >8%   04/16/2017 10:02 AM 7.9 (H) 4.8 - 5.6 % Final    Comment:    (NOTE)         Prediabetes: 5.7 - 6.4  Diabetes: >6.4         Glycemic control for adults with diabetes: <7.0     CBG: Recent Labs  Lab 01/14/18 1216 01/14/18 1734 01/14/18 2109 01/15/18 0731 01/15/18 1125  GLUCAP 262* 278* 195* 186* 173*    Review of Systems:   Not able  Past Medical History  He,  has a past medical history of Arthritis, Asthma, Chronic atrial fibrillation, Diverticulosis of colon (without mention of hemorrhage), ED (erectile dysfunction), Fibromyalgia, GERD (gastroesophageal reflux disease), Hard of hearing, Headache, History of blood clots, History of colon polyps, History of kidney stones, diabetes mellitus, gout, Inflammation of colonic mucosa, IPF (idiopathic pulmonary fibrosis) ([place]), Obesity, Peripheral vascular disease ([place]), Pneumonia, Prostate cancer ([place]), Pulmonary embolism ([place]), Restless legs syndrome (RLS), Skin cancer, Unspecified asthma(493.90), and Unspecified essential hypertension.   Surgical History    Past Surgical History:  Procedure Laterality Date  . ANTERIOR  CERVICAL DECOMP/DISCECTOMY FUSION N/A 05/09/2013   Procedure: ANTERIOR CERVICAL DECOMPRESSION/DISCECTOMY FUSION CERVICAL THREE-FOUR,CERVICAL FOUR-FIVE,CERVICAL FIVE-SIX;  Surgeon: Floyce Stakes, MD;  Location: [facility];  Service: Neurosurgery;  Laterality: N/A;  . ARTERY REPAIR     Left forearm  . BACK SURGERY     x 3  . BREATH TEK H PYLORI  01/08/2011   Procedure: BREATH TEK H PYLORI;  Surgeon: Pedro Earls, MD;  Location: [facility];  Service: General;  Laterality: N/A;  . CARDIOVERSION     x 2 attempts-unsuccessful.  [name] CATARACT EXTRACTION, BILATERAL    . COLONOSCOPY    . CYSTOSCOPY    . ESOPHAGOGASTRODUODENOSCOPY (EGD) WITH PROPOFOL N/A 09/21/2014   Procedure: ESOPHAGOGASTRODUODENOSCOPY (EGD) WITH PROPOFOL;  Surgeon: Manya Silvas, MD;  Location: [facility] ENDOSCOPY;  Service: Endoscopy;  Laterality: N/A;  . EYE SURGERY     cataract bil  . FOOT SURGERY     Left foot   . GASTRIC ROUX-EN-Y  08/11/2011   Procedure: LAPAROSCOPIC ROUX-EN-Y GASTRIC BYPASS WITH UPPER ENDOSCOPY;  Surgeon: Pedro Earls, MD;  Location: [facility];  Service: General;  Laterality: N/A;  . HAND SURGERY     LEFT  . injections in back     x 18  . JOINT REPLACEMENT    . KNEE SURGERY Left    x 4  . KNEE SURGERY Left    arthroscopy  . LAPAROSCOPIC INTERNAL HERNIA REPAIR N/A 03/08/2015   Procedure: LAPAROSCOPIC INTERNAL HERNIA REPAIR ;  Surgeon: [name], MD;  Location: [facility];  Service: General;  Laterality: N/A;  . LEG SURGERY     FOR NECROTIZING FASCITIS L LEG AND GROIN  . PANNICULECTOMY N/A 08/25/2016   Procedure: PANNICULECTOMY;  Surgeon: [name], MD;  Location: [facility];  Service: General;  Laterality: N/A;  . PENILE PROSTHESIS IMPLANT N/A 07/02/2014   Procedure: PENILE PROTHESIS INFLATABLE 3 PIECE (COLOPLAST) SCROTAL APPROACH;  Surgeon: Kathie Rhodes, MD;  Location: [facility];  Service: Urology;  Laterality: N/A;  . PENILE PROSTHESIS IMPLANT N/A 11/23/2014   Procedure: EXPLORATION AND  REVISION OF PENILE PROSTHESIS;  Surgeon: Kathie Rhodes, MD;  Location: [facility];  Service: Urology;  Laterality: N/A;  . PICC INSERTION W/OUT PORT/PUMP    . REPLACEMENT TOTAL KNEE Left    x 2  . SHOULDER ARTHROSCOPY    . SHOULDER SURGERY Left 2014  . TONSILLECTOMY    . TOTAL KNEE ARTHROPLASTY Right 04/27/2017  . TOTAL KNEE ARTHROPLASTY Right 04/27/2017   Procedure: TOTAL KNEE ARTHROPLASTY;  Surgeon: Melrose Nakayama, MD;  Location: [facility];  Service: Orthopedics;  Laterality: Right;  . TOTAL SHOULDER ARTHROPLASTY Left 01/26/2013   Procedure: TOTAL SHOULDER ARTHROPLASTY;  Surgeon: Nita Sells, MD;  Location: [facility];  Service: Orthopedics;  Laterality: Left;  Left total shoulder arthroplasty     Social History   reports that he has never smoked. He has never used smokeless tobacco. He reports that he does not drink alcohol or use drugs.   Family History   His family history includes Alzheimer's disease in his sister; Breast cancer in his mother; Cancer in his mother; Colon cancer (age of onset: 50) in his brother; Emphysema in his father; Heart attack in his brother; Prostate cancer in his brother; Uterine cancer in his mother.   Allergies Allergies  Allergen Reactions  . Sulfacetamide Sodium Swelling    throat swelling  . Latex Itching    Severe contact dermatitis  . Adhesive [Tape] Other (See Comments)    Tears skin-- "No tape of any kind, they all tear skin right off"     Home Medications  Prior to Admission medications   Medication Sig Start Date End Date Taking? Authorizing Provider  acarbose (PRECOSE) 50 MG tablet Take 50 mg by mouth 3 (three) times daily with meals.   Yes [provider]  atorvastatin (LIPITOR) 80 MG tablet Take 1 tablet (80 mg total) by mouth at bedtime. 02/18/17  Yes Minus Breeding, MD  benzonatate (TESSALON) 200 MG capsule Take 1 capsule (200 mg total) by mouth 3 (three) times daily as needed for cough. 01/14/18  Yes Sheikh, Omair Latif, DO    chlorpheniramine-HYDROcodone (TUSSIONEX) 10-8 MG/5ML SUER Take 5 mLs by mouth every 12 (twelve) hours as needed for cough. Patient taking differently: Take 1.5 mLs by mouth every 12 (twelve) hours as needed for cough.  01/05/18  Yes Lesleigh Noe, MD  FLUoxetine (PROZAC) 20 MG capsule TAKE 3 CAPSULES BY MOUTH EVERYDAY AT BEDTIME Patient taking differently: Take 60 mg by mouth at bedtime.  01/10/18  Yes Bedsole, Amy E, MD  furosemide (LASIX) 20 MG tablet Take 1 tablet (20 mg total) by mouth every other day. Patient taking differently: Take 20 mg by mouth daily.  08/10/17  Yes Duke, Tami Lin, PA  gabapentin (NEURONTIN) 300 MG capsule TAKE 1 CAPSULE BY MOUTH TWICE A DAY Patient taking differently: Take 300 mg by mouth 2 (two) times daily.  08/19/17  Yes Tat, Eustace Quail, DO  hydrocortisone 2.5 % cream Apply 1 application topically 2 (two) times daily as needed (itching).  12/09/17  Yes [provider]  hydroxypropyl methylcellulose / hypromellose (ISOPTO TEARS / GONIOVISC) 2.5 % ophthalmic solution Place 1 drop into both eyes 2 (two) times daily as needed for dry eyes.   Yes [provider]  Omega-3 Fatty Acids (FISH OIL) 1000 MG CAPS Take 1,000 mg by mouth daily.   Yes [provider]  omeprazole (PRILOSEC) 40 MG capsule TAKE 1 CAPSULE (40 MG TOTAL) BY MOUTH DAILY. Patient taking differently: Take 40 mg by mouth at bedtime.  11/11/17  Yes Bedsole, Amy E, MD  predniSONE (DELTASONE) 10 MG tablet 4 tabs x 4 days, 3 tabs x 4 days; 2 tabs x 4 days; 1 tab x 4 days 01/17/18  Yes Martyn Ehrich, NP  PRESCRIPTION MEDICATION Apply 1 g topically 4 (four) times daily as needed (pain). Baclofen/Gabapentin/Lidocaine Compund Cream   Yes [provider]  primidone (MYSOLINE) 50 MG tablet 150 mg in the morning, 100 mg at night Patient taking differently: Take 100-150 mg by mouth  See admin instructions. 150 mg in the morning, 100 mg at night 03/08/17  Yes Tat, Eustace Quail, DO   ranitidine (ZANTAC) 300 MG tablet Take 300 mg by mouth at bedtime.  12/03/17  Yes [provider]  sucralfate (CARAFATE) 1 g tablet Take 1 g by mouth 3 (three) times daily before meals.  12/21/17  Yes [provider]  tamsulosin (FLOMAX) 0.4 MG CAPS capsule Take 0.4 mg by mouth at bedtime.  01/03/18  Yes [provider]  tiZANidine (ZANAFLEX) 4 MG tablet Take 4 mg by mouth every 6 (six) hours as needed for muscle spasms.  12/20/17  Yes [provider]  vitamin E 400 UNIT capsule Take 400 Units by mouth daily.   Yes [provider]  warfarin (COUMADIN) 2.5 MG tablet Take 2.5 mg by mouth daily. Take 2.5mg  SUN and THUR and 3.75mg  on all other days.   Yes [provider]  zolpidem (AMBIEN CR) 12.5 MG CR tablet Take 12.5 mg by mouth at bedtime.   Yes [provider]  ipratropium-albuterol (DUONEB) 0.5-2.5 (3) MG/3ML SOLN Take 3 mLs by nebulization every 6 (six) hours as needed. Patient taking differently: Take 3 mLs by nebulization every 6 (six) hours as needed (shortness of breath).  01/17/18   Martyn Ehrich, NP     Erick Colace ACNP-BC [place] Pager # [phone] OR # [phone] if no answer  Attending Note:  77 year old male with PMH of IPF who presents after having found a PE and worsening SOB.  Patient was seen in the pulmonary clinic and started on prednisone and O2 3L dependent.  Patient reports with worsening SOB with decreased BS on exam, but appears very comfortable.  I reviewed CXR myself, evidence of fibrosis noted.    Acute on chronic respiratory failure:  - BiPAP  - Supplemental O2  - High dose steroids  - No metformin given renal function  PE:  - Heparin  - Coumadin failure, may need to consider NOAC vs LMWH after checking renal function  - No need for lytics  Acute pulmonary edema:  - Lasix as renal function allows  - Monitor hemodynamics  GOC:  - DNR status confirmed  - May need  to consider involvement of palliative care.  PCCM will follow  Patient seen and examined, agree with above note.  I dictated the care and orders written for this patient under my direction.  Rush Farmer, [place]

## 2018-01-18 NOTE — ED Notes (Addendum)
Please call Nai Dasch with an update [phone]

## 2018-01-18 NOTE — ED Notes (Signed)
Dr. Ronnald Nian made aware of patient's 7/10 chest pain. No new orders at this time.

## 2018-01-18 NOTE — ED Notes (Signed)
Got patient undress on the monitor did ekg shown to Dr Ronnald Nian patient is resting with nurse and family at bedside

## 2018-01-18 NOTE — Telephone Encounter (Signed)
Working on patient assistance for [employer], routing to my nurse Lattie Haw to follow-up on this

## 2018-01-18 NOTE — ED Triage Notes (Addendum)
Pt was walking with his walker this morning when he tripped and fell then landed on his face. Obvious injury to nose. C/o bilateral rib cage pain. Recently diagnosed with PE, pulmonary fibrosis, prostate cancer. A/O x4.

## 2018-01-18 NOTE — ED Notes (Signed)
Respiratory bedside placing patient on bipap

## 2018-01-18 NOTE — ED Notes (Signed)
Dr. Ronnald Nian made aware of rectal temp 100.4

## 2018-01-18 NOTE — Telephone Encounter (Signed)
-----   Message from Tish Men, MD sent at 01/18/2018  8:35 AM EST ----- Regarding: RE: Anticoagulation  Eliquis or Xarelto often have patient assistance program. I have included my nursing staff in the email and see if they can help you get the patient to pay for the drug. If there is no way that they can pay for the medication despite patient assistance program, then continuing warfarin is reasonable, so long as the patient understands the caveats with continuing warfarin due to the uncertainty regarding the timing of the PTE (see my note on different anticoagulation options).   Hope this helps.  Vedia Coffer   ----- Message ----- From: Martyn Ehrich, NP Sent: 01/17/2018   6:14 PM EST To: Tish Men, MD Subject: Anticoagulation                                You saw Mr. Odriscoll recently as an in-patient consult for PE on warfarin. You recommend Lovenox but patient was hesitant about life long injections. Ultimately he was discharged with recommendations to start Eliquis.   I saw patient today for hospital follow-up. Wife states insurance did not cover medication and that they can not afford $130 dollars a month for Eliquis. They resumed coumadin yesterday, INR was drawn today in office.   I just wanted to make you aware that they were unable to start Eliquis. Do you recommend anything additional or should he continue coumadin as previously prescribed.   Thank you, Geraldo Pitter NP  pulmonary care

## 2018-01-19 ENCOUNTER — Encounter (HOSPITAL_COMMUNITY): Payer: Self-pay | Admitting: Primary Care

## 2018-01-19 ENCOUNTER — Other Ambulatory Visit: Payer: Self-pay

## 2018-01-19 DIAGNOSIS — R9389 Abnormal findings on diagnostic imaging of other specified body structures: Secondary | ICD-10-CM

## 2018-01-19 DIAGNOSIS — Z7901 Long term (current) use of anticoagulants: Secondary | ICD-10-CM

## 2018-01-19 DIAGNOSIS — N183 Chronic kidney disease, stage 3 (moderate): Secondary | ICD-10-CM

## 2018-01-19 DIAGNOSIS — I2699 Other pulmonary embolism without acute cor pulmonale: Secondary | ICD-10-CM

## 2018-01-19 DIAGNOSIS — C61 Malignant neoplasm of prostate: Secondary | ICD-10-CM

## 2018-01-19 DIAGNOSIS — E43 Unspecified severe protein-calorie malnutrition: Secondary | ICD-10-CM

## 2018-01-19 LAB — BLOOD CULTURE ID PANEL (REFLEXED)
Acinetobacter baumannii: NOT DETECTED
CANDIDA KRUSEI: NOT DETECTED
Candida albicans: NOT DETECTED
Candida glabrata: NOT DETECTED
Candida parapsilosis: NOT DETECTED
Candida tropicalis: NOT DETECTED
ENTEROBACTER CLOACAE COMPLEX: NOT DETECTED
Enterobacteriaceae species: NOT DETECTED
Enterococcus species: NOT DETECTED
Escherichia coli: NOT DETECTED
Haemophilus influenzae: NOT DETECTED
Klebsiella oxytoca: NOT DETECTED
Klebsiella pneumoniae: NOT DETECTED
Listeria monocytogenes: NOT DETECTED
Methicillin resistance: NOT DETECTED
Neisseria meningitidis: NOT DETECTED
Proteus species: NOT DETECTED
Pseudomonas aeruginosa: NOT DETECTED
Serratia marcescens: NOT DETECTED
Staphylococcus aureus (BCID): NOT DETECTED
Staphylococcus species: DETECTED — AB
Streptococcus agalactiae: NOT DETECTED
Streptococcus pneumoniae: NOT DETECTED
Streptococcus pyogenes: NOT DETECTED
Streptococcus species: NOT DETECTED

## 2018-01-19 LAB — BASIC METABOLIC PANEL
ANION GAP: 15 (ref 5–15)
BUN: 43 mg/dL — ABNORMAL HIGH (ref 8–23)
CO2: 22 mmol/L (ref 22–32)
Calcium: 8.3 mg/dL — ABNORMAL LOW (ref 8.9–10.3)
Chloride: 100 mmol/L (ref 98–111)
Creatinine, Ser: 1.71 mg/dL — ABNORMAL HIGH (ref 0.61–1.24)
GFR calc Af Amer: 44 mL/min — ABNORMAL LOW (ref >60)
GFR calc non Af Amer: 38 mL/min — ABNORMAL LOW (ref >60)
Glucose, Bld: 277 mg/dL — ABNORMAL HIGH (ref 70–99)
Potassium: 4.4 mmol/L (ref 3.5–5.1)
SODIUM: 137 mmol/L (ref 135–145)

## 2018-01-19 LAB — GLUCOSE, CAPILLARY
Glucose-Capillary: 229 mg/dL — ABNORMAL HIGH (ref 70–99)
Glucose-Capillary: 238 mg/dL — ABNORMAL HIGH (ref 70–99)
Glucose-Capillary: 242 mg/dL — ABNORMAL HIGH (ref 70–99)
Glucose-Capillary: 264 mg/dL — ABNORMAL HIGH (ref 70–99)
Glucose-Capillary: 300 mg/dL — ABNORMAL HIGH (ref 70–99)
Glucose-Capillary: 362 mg/dL — ABNORMAL HIGH (ref 70–99)

## 2018-01-19 LAB — PROTIME-INR
INR: 3.48
Prothrombin Time: 34.4 seconds — ABNORMAL HIGH (ref 11.4–15.2)

## 2018-01-19 MED ORDER — GLUCERNA SHAKE PO LIQD
237.0000 mL | Freq: Three times a day (TID) | ORAL | Status: DC
  Administered 2018-01-19 – 2018-01-26 (×12): 237 mL via ORAL

## 2018-01-19 MED ORDER — METOPROLOL TARTRATE 25 MG PO TABS
25.0000 mg | ORAL_TABLET | Freq: Two times a day (BID) | ORAL | Status: DC
  Administered 2018-01-19 – 2018-01-25 (×14): 25 mg via ORAL
  Filled 2018-01-19 (×14): qty 1

## 2018-01-19 MED ORDER — IPRATROPIUM-ALBUTEROL 0.5-2.5 (3) MG/3ML IN SOLN
3.0000 mL | Freq: Three times a day (TID) | RESPIRATORY_TRACT | Status: DC
  Administered 2018-01-19 – 2018-01-26 (×22): 3 mL via RESPIRATORY_TRACT
  Filled 2018-01-19 (×27): qty 3

## 2018-01-19 NOTE — Progress Notes (Signed)
ANTICOAGULATION CONSULT NOTE - Initial Consult  Pharmacy Consult for Heparin when INR <2 Indication: atrial fibrillation and pulmonary embolus (dx 12/4)  Allergies  Allergen Reactions  . Sulfacetamide Sodium Swelling    throat swelling  . Latex Itching    Severe contact dermatitis  . Adhesive [Tape] Other (See Comments)    Tears skin-- "No tape of any kind, they all tear skin right off"    Patient Measurements:   Heparin Dosing Weight: 80 kg  Vital Signs: Temp: 98.4 F (36.9 C) (12/11 0436) Temp Source: Axillary (12/11 0436) BP: 106/76 (12/11 0436) Pulse Rate: 84 (12/11 0436)  Labs: Recent Labs    01/17/18 1728 01/18/18 1045 01/19/18 0330  HGB  --  12.2*  --   HCT  --  39.8  --   PLT  --  200  --   LABPROT 27.5* 28.0* 34.4*  INR 2.4* 2.66 3.48  CREATININE  --  1.53*  --     Estimated Creatinine Clearance: 45.1 mL/min (A) (by C-G formula based on SCr of 1.53 mg/dL (H)).   Medical History: Past Medical History:  Diagnosis Date  . Arthritis    hands   . Asthma   . Chronic atrial fibrillation    a.fib on warfarin- Dr. Percival Spanish follows  . Diverticulosis of colon (without mention of hemorrhage)   . ED (erectile dysfunction)   . Fibromyalgia   . GERD (gastroesophageal reflux disease)   . Hard of hearing    wears hearing aids  . Headache   . History of blood clots    behind right knee and then went into left lung 59yrs ago  . History of colon polyps   . History of kidney stones   . Hx of diabetes mellitus    "no longer diabetic since gastric bypass" - no longer taking metformin"  in 2 months "problems with low blood sugar now"  . Hx of gout    but doesn't take any meds  . Inflammation of colonic mucosa    recent admission and release from [facility] 02-28-15-remains on oral antibiotic  . IPF (idiopathic pulmonary fibrosis) ([place])   . Obesity   . Peripheral vascular disease ([place])   . Pneumonia    last time about 58yrs ago  . Prostate cancer ([place])   . Pulmonary  embolism ([place])    over 10 yrs ago "many years ago"  . Restless legs syndrome (RLS)   . Skin cancer   . Unspecified asthma(493.90)    as a child  . Unspecified essential hypertension    hx of no longer on medication due to gastric bypass     Medications:  Medications Prior to Admission  Medication Sig Dispense Refill Last Dose  . acarbose (PRECOSE) 50 MG tablet Take 50 mg by mouth 3 (three) times daily with meals.   01/17/2018 at Unknown time  . atorvastatin (LIPITOR) 80 MG tablet Take 1 tablet (80 mg total) by mouth at bedtime. 90 tablet 3 01/17/2018 at Unknown time  . benzonatate (TESSALON) 200 MG capsule Take 1 capsule (200 mg total) by mouth 3 (three) times daily as needed for cough. 20 capsule 0 01/17/2018 at Unknown time  . chlorpheniramine-HYDROcodone (TUSSIONEX) 10-8 MG/5ML SUER Take 5 mLs by mouth every 12 (twelve) hours as needed for cough. (Patient taking differently: Take 1.5 mLs by mouth every 12 (twelve) hours as needed for cough. ) 140 mL 0 Past Week at Unknown time  . FLUoxetine (PROZAC) 20 MG capsule TAKE 3 CAPSULES  BY MOUTH EVERYDAY AT BEDTIME (Patient taking differently: Take 60 mg by mouth at bedtime. ) 270 capsule 1 01/17/2018 at Unknown time  . furosemide (LASIX) 20 MG tablet Take 1 tablet (20 mg total) by mouth every other day. (Patient taking differently: Take 20 mg by mouth daily. ) 180 tablet 1 01/17/2018 at Unknown time  . gabapentin (NEURONTIN) 300 MG capsule TAKE 1 CAPSULE BY MOUTH TWICE A DAY (Patient taking differently: Take 300 mg by mouth 2 (two) times daily. ) 180 capsule 1 01/17/2018 at Unknown time  . hydrocortisone 2.5 % cream Apply 1 application topically 2 (two) times daily as needed (itching).   2 Past Week at Unknown time  . hydroxypropyl methylcellulose / hypromellose (ISOPTO TEARS / GONIOVISC) 2.5 % ophthalmic solution Place 1 drop into both eyes 2 (two) times daily as needed for dry eyes.   Past Week at Unknown time  . Omega-3 Fatty Acids (FISH OIL) 1000 MG  CAPS Take 1,000 mg by mouth daily.   01/17/2018 at Unknown time  . omeprazole (PRILOSEC) 40 MG capsule TAKE 1 CAPSULE (40 MG TOTAL) BY MOUTH DAILY. (Patient taking differently: Take 40 mg by mouth at bedtime. ) 90 capsule 1 01/17/2018 at Unknown time  . predniSONE (DELTASONE) 10 MG tablet 4 tabs x 4 days, 3 tabs x 4 days; 2 tabs x 4 days; 1 tab x 4 days 40 tablet 0 01/17/2018 at Unknown time  . PRESCRIPTION MEDICATION Apply 1 g topically 4 (four) times daily as needed (pain). Baclofen/Gabapentin/Lidocaine Compund Cream   01/17/2018 at Unknown time  . primidone (MYSOLINE) 50 MG tablet 150 mg in the morning, 100 mg at night (Patient taking differently: Take 100-150 mg by mouth See admin instructions. 150 mg in the morning, 100 mg at night) 450 tablet 1 01/17/2018 at Unknown time  . ranitidine (ZANTAC) 300 MG tablet Take 300 mg by mouth at bedtime.   3 01/17/2018 at Unknown time  . sucralfate (CARAFATE) 1 g tablet Take 1 g by mouth 3 (three) times daily before meals.   6 01/18/2018 at Unknown time  . tamsulosin (FLOMAX) 0.4 MG CAPS capsule Take 0.4 mg by mouth at bedtime.    01/17/2018 at Unknown time  . tiZANidine (ZANAFLEX) 4 MG tablet Take 4 mg by mouth every 6 (six) hours as needed for muscle spasms.    Past Month at Unknown time  . vitamin E 400 UNIT capsule Take 400 Units by mouth daily.   01/17/2018 at Unknown time  . warfarin (COUMADIN) 2.5 MG tablet Take 2.5 mg by mouth daily. Take 2.5mg  SUN and THUR and 3.75mg  on all other days.   01/17/2018 at 2000  . zolpidem (AMBIEN CR) 12.5 MG CR tablet Take 12.5 mg by mouth at bedtime.   01/17/2018 at Unknown time  . ipratropium-albuterol (DUONEB) 0.5-2.5 (3) MG/3ML SOLN Take 3 mLs by nebulization every 6 (six) hours as needed. (Patient taking differently: Take 3 mLs by nebulization every 6 (six) hours as needed (shortness of breath). ) 360 mL 0     Assessment: 77 yo M with recent admission 12/4 > 12/8 for mgmt of pulm disease.  Noted at that time to have new PE on  CT scan.  Pt was on Coumadin with therapeutic INR at that time but declined LMWH and was unable to afford Eliqus/Xarelto.  Pt continues on Coumadin.  INR supratherapeutic today.  Pharmacy has been asked to initiate heparin when INR <2 for recent PE and hx afib.  INR 3.48  today  Goal of Therapy:  Heparin level 0.3-0.7 units/ml Monitor platelets by anticoagulation protocol: Yes   Plan:  No heparin today. Follow-up with INR in AM.  Kamalani Mastro A. Levada Dy, PharmD, [place] Pager: [phone] Please utilize Amion for appropriate phone number to reach the unit pharmacist ([facility])    01/19/2018 7:38 AM

## 2018-01-19 NOTE — Telephone Encounter (Signed)
See below for information regarding Eliquis assistance

## 2018-01-19 NOTE — Progress Notes (Signed)
Belenda Cruise Government social research officer ([facility]) called; order received to stop Cardizem gtt at this time. Monitor SBP & administer PO metoprolol once SBP > 100. Assessment ongoing.

## 2018-01-19 NOTE — Progress Notes (Addendum)
Inpatient Diabetes Program Recommendations  AACE/ADA: New Consensus Statement on Inpatient Glycemic Control (2015)  Target Ranges:  Prepandial:   less than 140 mg/dL      Peak postprandial:   less than 180 mg/dL (1-2 hours)      Critically ill patients:  140 - 180 mg/dL   Lab Results  Component Value Date   GLUCAP 238 (H) 01/19/2018   HGBA1C 6.6 (H) 10/14/2017    Review of Glycemic Control Results for [name], [name] (MRN [id number]) as of 01/19/2018 15:41  Ref. Range 01/19/2018 00:23 01/19/2018 04:39 01/19/2018 08:33 01/19/2018 11:49  Glucose-Capillary Latest Ref Range: 70 - 99 mg/dL 242 (H) 264 (H) 229 (H) 238 (H)   Diabetes history: Type 2 DM Outpatient Diabetes medications: none Current orders for Inpatient glycemic control: Novolog 0-15 units TID, Novolog 0-5 units QHS Solumedrol 125 mg Q6H  Inpatient Diabetes Program Recommendations:    In the setting of steroids, blood glucose trends are increased. Consider repeating A1C, last result was from 10/2017.  Also, if post prandials continue to exceed 180 mg/dL consider adding Novolog 4 units TID (assuming patient is consuming >50% of meals).   Thanks, Bronson Curb, MSN, RNC-OB Diabetes Coordinator [phone] (8a-5p)

## 2018-01-19 NOTE — Progress Notes (Signed)
PHARMACY - PHYSICIAN COMMUNICATION CRITICAL VALUE ALERT - BLOOD CULTURE IDENTIFICATION (BCID)  [name] is an 77 y.o. male who presented to [facility] on 01/18/2018 with respiratory failure  Assessment: respiratory failure in the setting of pulmonary fibrosis. Blood cultures show GPC in 1/4 with BCID showing staph species ([place] A neg). Likely contaminant. WBC is elevated but noted on IV steroids  Name of physician (or Provider) Contacted: K Schorr NP  Current antibiotics: none  Changes to prescribed antibiotics recommended:  -Likely contaminant. No antibiotics at this time  Results for orders placed or performed during the hospital encounter of 01/18/18  Blood Culture ID Panel (Reflexed) (Collected: 01/18/2018  2:58 PM)  Result Value Ref Range   Enterococcus species NOT DETECTED NOT DETECTED   Listeria monocytogenes NOT DETECTED NOT DETECTED   Staphylococcus species DETECTED (A) NOT DETECTED   Staphylococcus aureus (BCID) NOT DETECTED NOT DETECTED   Methicillin resistance NOT DETECTED NOT DETECTED   Streptococcus species NOT DETECTED NOT DETECTED   Streptococcus agalactiae NOT DETECTED NOT DETECTED   Streptococcus pneumoniae NOT DETECTED NOT DETECTED   Streptococcus pyogenes NOT DETECTED NOT DETECTED   Acinetobacter baumannii NOT DETECTED NOT DETECTED   Enterobacteriaceae species NOT DETECTED NOT DETECTED   Enterobacter cloacae complex NOT DETECTED NOT DETECTED   Escherichia coli NOT DETECTED NOT DETECTED   Klebsiella oxytoca NOT DETECTED NOT DETECTED   Klebsiella pneumoniae NOT DETECTED NOT DETECTED   Proteus species NOT DETECTED NOT DETECTED   Serratia marcescens NOT DETECTED NOT DETECTED   Haemophilus influenzae NOT DETECTED NOT DETECTED   Neisseria meningitidis NOT DETECTED NOT DETECTED   Pseudomonas aeruginosa NOT DETECTED NOT DETECTED   Candida albicans NOT DETECTED NOT DETECTED   Candida glabrata NOT DETECTED NOT DETECTED   Candida krusei NOT DETECTED NOT  DETECTED   Candida parapsilosis NOT DETECTED NOT DETECTED   Candida tropicalis NOT DETECTED NOT DETECTED    [name] 01/19/2018  8:05 PM

## 2018-01-19 NOTE — Progress Notes (Signed)
SBP 78-90s. PM metoprolol held. Pt currently on Cardizem gtt at 5mg /hr. PT asymptomatic at this time. Hospitalist paged regarding low SBP & to clarify cardizem gtt orders. Assessment ongoing.

## 2018-01-19 NOTE — Progress Notes (Signed)
Pt resting comfortably- no BIPAP needed at this time. Pt has been off almost 24 hrs per family.  Machine on stand-by at bedside.

## 2018-01-19 NOTE — Progress Notes (Signed)
Initial Nutrition Assessment  DOCUMENTATION CODES:   Severe malnutrition in context of chronic illness  INTERVENTION:    Glucerna Shake po TID, each supplement provides 220 kcal and 10 grams of protein  NUTRITION DIAGNOSIS:   Severe Malnutrition related to chronic illness(pulmonary fibrosis) as evidenced by severe fat depletion, severe muscle depletion  GOAL:   Patient will meet greater than or equal to 90% of their needs  MONITOR:   PO intake, Supplement acceptance, Labs, Skin, I & O's, Weight trends  REASON FOR ASSESSMENT:   Consult Assessment of nutrition requirement/status  ASSESSMENT:   77 yo Male with PMH significant of HTN; RLS; remote PE; prostate CA: PVD; DM, diet controlled s/p gastric bypass; and afib on Coumadin presenting with a fall and chest pain; new dx of pulmonary fibrosis.  RD spoke with patient's wife at bedside. Pt resting. Wife reports pt's appetite has been poor recently due to acute illness. She also states pt has lost approximately 18 lbs in the last 2-3 week period.   He has a hx of gastric bypass surgery in 2013.  Pt typically consumes small portioned meals at home.  Wife makes him fruit smoothies with protein powder.  Wife is anxious for pt to start eating as he's not had anything since Monday. Labs & medications reviewed. BUN 43 (H). Cr 1.71 (H). CBG's [id number].  Verbal with Read Back order received per Dr. Cathlean Sauer to advance PO diet.  NUTRITION - FOCUSED PHYSICAL EXAM:    Most Recent Value  Orbital Region  Moderate depletion  Upper Arm Region  Moderate depletion  Thoracic and Lumbar Region  Unable to assess  Buccal Region  Moderate depletion  Temple Region  Moderate depletion  Clavicle Bone Region  Severe depletion  Clavicle and Acromion Bone Region  Severe depletion  Scapular Bone Region  Unable to assess  Dorsal Hand  Unable to assess  Patellar Region  Severe depletion  Anterior Thigh Region  Severe depletion  Posterior  Calf Region  Severe depletion  Edema (RD Assessment)  None     Diet Order:   Diet Order            Diet regular Room service appropriate? Yes; Fluid consistency: Thin  Diet effective now             EDUCATION NEEDS:   No education needs have been identified at this time  Skin:  Skin Assessment: Reviewed RN Assessment  Last BM:  12/7  Height:   Ht Readings from Last 1 Encounters:  01/19/18 6' (1.829 m)   Weight:   Wt Readings from Last 1 Encounters:  01/19/18 80.3 kg   BMI:  Body mass index is 24.01 kg/m.  Estimated Nutritional Needs:   Kcal:  1800-2000  Protein:  85-100 gm  Fluid:  1.8-2.0 L  Arthur Holms, RD, LDN Pager #: [phone] After-Hours Pager #: [phone]

## 2018-01-19 NOTE — Consult Note (Signed)
Consultation Note Date: 01/19/2018   Patient Name: [name]  DOB: 09-21-1940  MRN: [id number]  Age / Sex: 77 y.o., male  PCP: Jinny Sanders, MD Referring Physician: Tawni Millers  Reason for Consultation: Establishing goals of care  HPI/Patient Profile: 77 y.o. male  with past medical history of relatively recent diagnosis of idiopathic pulmonary fibrosis, pulmonary embolism over 10 years ago, "severe" asthma as a child, hypertension and diabetes controlled after gastric bypass in 2013, chronic A. fib, peripheral vascular disease, history of blood clots behind right knee and then went to the left lung (patient was a truck driver), recent diagnosis (5 months ago) of mid level prostate cancer due for radiation implants January 2020, GERD, history of colon polyps/diverticulosis, history of kidney stones, fibromyalgia, admitted on 01/18/2018 with acute on chronic hypoxemic respiratory failure, IPF exacerbation.  Palliative consulted for progressive respiratory failure, goals of care.   Clinical Assessment and Goals of Care:  I have reviewed medical records including EPIC notes, labs and imaging, received report from bedside nursing staff, assessed the patient and then met at the bedside along with wife [name] and daughter [name] to discuss diagnosis prognosis, [facility], EOL wishes, disposition and options.  I introduced Palliative Medicine as specialized medical care for people living with serious illness. It focuses on providing relief from the symptoms and stress of a serious illness. The goal is to improve quality of life for both the patient and the family.  [name] is resting quietly in bed.  He greets me, making and mostly keeping eye contact.  He appears acutely/chronically ill and frail.  He looks malnourished.  Present today at bedside is wife [name] and their daughter, [name].   Mr. Fudala tells me  that he feels some better than he did this morning.  Family share their concern over his decline over the last few months and what seemed to be improvements before leaving the hospital that became a sudden worsening of respiratory status.  They state several times that they do not want [name] discharged until his is stable and improved.  I share that discharge is not being considered at this time.    We discussed a brief life review of the patient. 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 He has always been an active man, with a strong family.  He taught his daughter to ride motorcycles and his wife is a Midwife".   As far as functional and nutritional status, family tells me that [name] has a treadmill that he used almost daily until a few months ago.  He has had a poor appetite and lost about 20 lbs in 2 months. [name] shares that she feels his decline began with his gastric bypass surgery in 2013.  She feels that he traded one set of problems for another.    We discussed their current illness and what it means in the larger context of their on-going co-morbidities. Mr. Para March  was diagnosed with prostate cancer about 5 months ago and is scheduled for raditaion seed implants in January 2020.  Family shares that PCP watched [name]'s PSA climb to 6 before taking action.  Daughter [name] states that he was doing what he was supposed to and she is upset that action wasn't taken by PCP earlier.  I share that we will talk about the chronic illness pathway on our next visit.      Health Care Power of attorney is discussed, see below.  Advanced directives, concepts specific to code status, and rehospitalization were considered and discussed. I attempted to elicit values and goals of care important to the patient.  [name] tells me that he would not want to live if he had poor qulity of life, he were unable to do things that give him pleasure.   He declines CPR, intubation, defibrillation/cardioversion, but is agreeable to use of BiPAP for a limited time (24-48 hours).    Questions and concerns were addressed. The family was encouraged to call with questions or concerns.   Family shares that [name] has had a marked decline over the last 6 to 8 weeks.  They share that prior to this time, he had been exercising on his treadmill, working part-time.  They share that he has lost approximately 20 pounds in 2 months and endorse that he is weak and frail.  We talked about physical therapy evaluation, waiting stability with breathing.  We talked about SNF rehab versus in-home rehab, and advise to wait for PT eval, but that Mr. Lantry and his family decide what works best for them.   We plan for a family meeting 12/12 at 3pm at bedside.    HCPOA NEXT OF KIN -Mr. Leach names his wife, [name], as his Ambulance person.  Also his daughter [name].    SUMMARY OF RECOMMENDATIONS   At this point, continue to treat reversible conditions. No intubation, no CPR, no defibrillation. Agreeable to the use of BiPAP as a bridge, but for no more than 48 hours. Goal is to improve strength, connect with ILD clinic, home PT. Seeking quality of life with some longevity if possible.   Code Status/Advance Care Planning:  DNR -verified with patient and family at bedside.  Symptom Management:   Per hospitalist, no additional needs at this time.   Palliative Prophylaxis:   Frequent Pain Assessment and Turn Reposition  Additional Recommendations (Limitations, Scope, Preferences):  Treat reversible diseases, no CPR, no intubation, no defibrillation, okay for BiPAP for short periods.  Psycho-social/Spiritual:   Desire for further Chaplaincy support:no  Additional Recommendations: Caregiving  Support/Resources and Education on Hospice  Prognosis:   Unable to determine, based on outcomes.  If [name] can improve, over 1 year would not be  surprising.  However, if his ILD disease progression cannot be stabilized and slowed, 6 months or less would not be surprising.  Discharge Planning: To be determined, based on outcomes.  At this point patient and family decline any future rehab stating their preference is for home therapies.      Primary Diagnoses: Present on Admission: . Acute on chronic respiratory failure with hypoxia ([place]) . Atrial fibrillation ([place]) . Malignant neoplasm of prostate ([place]) . Pulmonary fibrosis ([place]) . Type 2 diabetes mellitus with neurologic complication ([place]) . CKD (chronic kidney disease) stage 3, GFR 30-59 ml/min ([facility])   I have reviewed the medical record, interviewed the patient and family, and examined the patient. The following aspects are pertinent.  Past Medical History:  Diagnosis Date  . Arthritis    hands   . Asthma   . Chronic atrial fibrillation    a.fib on warfarin- Dr. Percival Spanish follows  . Diverticulosis of colon (without mention of hemorrhage)   . ED (erectile dysfunction)   . Fibromyalgia   . GERD (gastroesophageal reflux disease)   . Hard of hearing    wears hearing aids  . Headache   . History of blood clots    behind right knee and then went into left lung 84yr ago  . History of colon polyps   . History of kidney stones   . Hx of diabetes mellitus    "no longer diabetic since gastric bypass" - no longer taking metformin"  in 2 months "problems with low blood sugar now"  . Hx of gout    but doesn't take any meds  . Inflammation of colonic mucosa    [facility]  . IPF (idiopathic pulmonary fibrosis) ([facility]   . Obesity   . Peripheral vascular disease ([facility]   . Pneumonia    last time about 223yrago  . Prostate cancer ([facility]  . Pulmonary embolism ([facility]   over 10 yrs ago "many years ago"  . Restless legs syndrome (RLS)   . Skin cancer   . Unspecified asthma(493.90)    as a child  . Unspecified  essential hypertension    hx of no longer on medication due to gastric bypass    Social History   Socioeconomic History  . Marital status: Married    Spouse name: Not on file  . Number of children: 1  . Years of education: Not on file  . Highest education level: Not on file  Occupational History  . Occupation: Retired    Comment: [employer], operational. Financial resource strain: Not on file  . Food insecurity:    Worry: Not on file    Inability: Not on file  . Transportation needs:    Medical: Not on file    Non-medical: Not on file  Tobacco Use  . Smoking status: Never Smoker  . Smokeless tobacco: Never Used  Substance and Sexual Activity  . Alcohol use: No    Alcohol/week: 0.0 standard drinks  . Drug use: No  . Sexual activity: Yes  Lifestyle  . Physical activity:    Days per week: Not on file    Minutes per session: Not on file  . Stress: Not on file  Relationships  . Social connections:    Talks on phone: Not on file    Gets together: Not on file    Attends religious service: Not on file    Active member of club or organization: Not on file    Attends meetings of clubs or organizations: Not on file    Relationship status: Not on file  Other Topics Concern  . Not on file  Social History Narrative   DIET: 3 meals, F&V, some water, crystal light, No fast food   Exercise: walks treadmill 30 min      1 Caffeine drinks daily       No living will.          Family History  Problem Relation Age of Onset  . Uterine cancer Mother        mets  . Breast cancer Mother   . Cancer Mother  cervical  . Emphysema Father   . Alzheimer's disease Sister   . Prostate cancer Brother   . Heart attack Brother   . Colon cancer Brother 59   Scheduled Meds: . atorvastatin  80 mg Oral QHS  . feeding supplement (ENSURE ENLIVE)  237 mL Oral BID BM  . FLUoxetine  60 mg Oral QHS  . furosemide  40 mg Intravenous Q12H  . insulin aspart  0-15 Units Subcutaneous  TID WC  . insulin aspart  0-5 Units Subcutaneous QHS  . ipratropium-albuterol  3 mL Nebulization TID  . methylPREDNISolone (SOLU-MEDROL) injection  125 mg Intravenous Q6H  . sodium chloride flush  3 mL Intravenous Q12H   Continuous Infusions: . diltiazem (CARDIZEM) infusion 5 mg/hr (01/19/18 0541)   PRN Meds:.albuterol, zolpidem Medications Prior to Admission:  Prior to Admission medications   Medication Sig Start Date End Date Taking? Authorizing Provider  acarbose (PRECOSE) 50 MG tablet Take 50 mg by mouth 3 (three) times daily with meals.   Yes [provider]  atorvastatin (LIPITOR) 80 MG tablet Take 1 tablet (80 mg total) by mouth at bedtime. 02/18/17  Yes Minus Breeding, MD  benzonatate (TESSALON) 200 MG capsule Take 1 capsule (200 mg total) by mouth 3 (three) times daily as needed for cough. 01/14/18  Yes Sheikh, Omair Latif, DO  chlorpheniramine-HYDROcodone (TUSSIONEX) 10-8 MG/5ML SUER Take 5 mLs by mouth every 12 (twelve) hours as needed for cough. Patient taking differently: Take 1.5 mLs by mouth every 12 (twelve) hours as needed for cough.  01/05/18  Yes Lesleigh Noe, MD  FLUoxetine (PROZAC) 20 MG capsule TAKE 3 CAPSULES BY MOUTH EVERYDAY AT BEDTIME Patient taking differently: Take 60 mg by mouth at bedtime.  01/10/18  Yes Bedsole, Amy E, MD  furosemide (LASIX) 20 MG tablet Take 1 tablet (20 mg total) by mouth every other day. Patient taking differently: Take 20 mg by mouth daily.  08/10/17  Yes Duke, Tami Lin, PA  gabapentin (NEURONTIN) 300 MG capsule TAKE 1 CAPSULE BY MOUTH TWICE A DAY Patient taking differently: Take 300 mg by mouth 2 (two) times daily.  08/19/17  Yes Tat, Eustace Quail, DO  hydrocortisone 2.5 % cream Apply 1 application topically 2 (two) times daily as needed (itching).  12/09/17  Yes [provider]  hydroxypropyl methylcellulose / hypromellose (ISOPTO TEARS / GONIOVISC) 2.5 % ophthalmic solution Place 1 drop into both eyes 2 (two) times  daily as needed for dry eyes.   Yes [provider]  Omega-3 Fatty Acids (FISH OIL) 1000 MG CAPS Take 1,000 mg by mouth daily.   Yes [provider]  omeprazole (PRILOSEC) 40 MG capsule TAKE 1 CAPSULE (40 MG TOTAL) BY MOUTH DAILY. Patient taking differently: Take 40 mg by mouth at bedtime.  11/11/17  Yes Bedsole, Amy E, MD  predniSONE (DELTASONE) 10 MG tablet 4 tabs x 4 days, 3 tabs x 4 days; 2 tabs x 4 days; 1 tab x 4 days 01/17/18  Yes Martyn Ehrich, NP  PRESCRIPTION MEDICATION Apply 1 g topically 4 (four) times daily as needed (pain). Baclofen/Gabapentin/Lidocaine Compund Cream   Yes [provider]  primidone (MYSOLINE) 50 MG tablet 150 mg in the morning, 100 mg at night Patient taking differently: Take 100-150 mg by mouth See admin instructions. 150 mg in the morning, 100 mg at night 03/08/17  Yes Tat, Eustace Quail, DO  ranitidine (ZANTAC) 300 MG tablet Take 300 mg by mouth at bedtime.  12/03/17  Yes [provider]  sucralfate (CARAFATE) 1 g tablet Take 1 g by mouth 3 (three) times daily before meals.  12/21/17  Yes [provider]  tamsulosin (FLOMAX) 0.4 MG CAPS capsule Take 0.4 mg by mouth at bedtime.  01/03/18  Yes [provider]  tiZANidine (ZANAFLEX) 4 MG tablet Take 4 mg by mouth every 6 (six) hours as needed for muscle spasms.  12/20/17  Yes [provider]  vitamin E 400 UNIT capsule Take 400 Units by mouth daily.   Yes [provider]  warfarin (COUMADIN) 2.5 MG tablet Take 2.5 mg by mouth daily. Take 2.33m SUN and THUR and 3.751mon all other days.   Yes [provider]  zolpidem (AMBIEN CR) 12.5 MG CR tablet Take 12.5 mg by mouth at bedtime.   Yes [provider]  ipratropium-albuterol (DUONEB) 0.5-2.5 (3) MG/3ML SOLN Take 3 mLs by nebulization every 6 (six) hours as needed. Patient taking differently: Take 3 mLs by nebulization every 6 (six) hours as needed (shortness of breath).  01/17/18    WaMartyn EhrichNP   Allergies  Allergen Reactions  . Sulfacetamide Sodium Swelling    throat swelling  . Latex Itching    Severe contact dermatitis  . Adhesive [Tape] Other (See Comments)    Tears skin-- "No tape of any kind, they all tear skin right off"   Review of Systems  Unable to perform ROS: Acuity of condition  Constitutional: Positive for unexpected weight change.    Physical Exam  Constitutional: He is oriented to person, place, and time. He appears ill.  Appears acutely/chronically ill, frail.  Makes and mostly keeps eye contact.  HENT:  Head: Atraumatic.  Temporal wasting, sunken around eyes  Cardiovascular: Normal rate.  Pulmonary/Chest: Effort normal. No respiratory distress.  Abdominal: Soft. He exhibits no distension.  Musculoskeletal:       Right lower leg: He exhibits no edema.       Left lower leg: He exhibits no edema.  Neurological: He is alert and oriented to person, place, and time.  Skin: Skin is warm and dry. There is pallor.  Psychiatric: His mood appears not anxious. He is not agitated.  Nursing note and vitals reviewed.   Vital Signs: BP 114/83 (BP Location: Left Arm)   Pulse 90   Temp (!) 97.5 F (36.4 C) (Axillary)   Resp (!) 26   Ht 6' (1.829 m)   Wt 80.3 kg   SpO2 98%   BMI 24.01 kg/m  Pain Scale: 0-10   Pain Score: 0-No pain   SpO2: SpO2: 98 % O2 Device:SpO2: 98 % O2 Flow Rate: .O2 Flow Rate (L/min): 10 L/min  IO: Intake/output summary: No intake or output data in the 24 hours ending 01/19/18 1109  LBM: Last BM Date: 01/15/18 Baseline Weight: Weight: 80.3 kg Most recent weight: Weight: 80.3 kg     Palliative Assessment/Data:   Flowsheet Rows     Most Recent Value  Intake Tab  Referral Department  Hospitalist  Unit at Time of Referral  Intermediate Care Unit  Palliative Care Primary Diagnosis  Pulmonary  Date Notified  01/18/18  Palliative Care Type  New Palliative care  Reason for referral  Clarify Goals of  Care  Date of Admission  01/18/18  # of days IP prior to Palliative referral  0  Clinical Assessment  Palliative Performance Scale Score  50%  Pain Max last 24 hours  Not able to report  Pain Min Last 24 hours  Not able to report  Dyspnea Max Last 24 Hours  Not able to report  Dyspnea Min Last 24 hours  Not able to report  Psychosocial & Spiritual Assessment  Palliative Care Outcomes  Patient/Family meeting held?  Yes  Palliative Care Outcomes  Clarified goals of care  Patient/Family wishes: Interventions discontinued/not started   Mechanical Ventilation      Time In: 1420 Time Out: 1540 Time Total: 80 minutes Greater than 50%  of this time was spent counseling and coordinating care related to the above assessment and plan.  Signed by: Drue Novel, NP   Please contact Palliative Medicine Team phone at [phone] for questions and concerns.  For individual provider: See Shea Evans

## 2018-01-19 NOTE — Consult Note (Signed)
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  Recent pulmonary emboli, while on Coumadin.  Presented to the emergency room 12/10 after a fall.  Found to be hypoxic with saturations in the 70s pulmonary asked to evaluate.  History of present illness   77 year old male patient who has a significant medical history as mentioned below recently  referred to [facility] pulmonary with new diagnosis of pulmonary fibrosis.  We had seen him in consult at [facility] on 12/4 for abnormal CT findings as well as pulmonary emboli.  We felt that the pulmonary emboli were small, and there is pulmonary fibrosis.  The overwhelming issue driving his hypoxia.  He was ultimately discharged to home with plan for follow-up in our clinic, prednisone taper, and then referral to ILD clinic.  He was discharged home on supplemental oxygen and supposed to have been on a prednisone taper which he did not get.  The prednisone taper was started in our clinic on hospital follow-up today.  He had been doing quite poorly with marked work of breathing at home on 2 L increasing to 3 L without assistance.  The prednisone was started initially on 12/9.  He presented to the emergency room today on 12/10 after a fall.  He reported he had not tripped or anything but essentially had become quite dizzy resulting in the fall.  He was brought to the emergency room for further evaluation, found to have pulse oximetry in the 70s on 2 L nasal cannula, he was weak, fatigued, slow to respond.  Oxygen was titrated up ultimately up to NIPPV.  Pulmonary asked to assist with acute hypoxic respiratory failure  Past Medical History  Prostate cancer (currently  on coumadin but has been coumadin failure), PE, possible COPD and newly identified pulmonary fibrosis.   Significant Hospital Events     Consults:  Pulmonary  Procedures:    Significant Diagnostic Tests:  CT imaging of C-spine, maxillofacial and head.  All negative for acute fracture or trauma  Micro Data:   Antimicrobials:    Interim history/subjective:  Feels better this AM, off BiPAP and 3L [place]  Objective   Blood pressure 114/75, pulse 95, temperature (!) 97.5 F (36.4 C), temperature source Oral, resp. rate (!) 30, height 6' (1.829 m), weight 80.3 kg, SpO2 95 %.    FiO2 (%):  [30 %] 30 %  No intake or output data in the 24 hours ending 01/19/18 1152 Filed Weights   01/19/18 1015  Weight: 80.3 kg   Examination: General: Frail 77 year old male, off BiPAP, NAD, dry mouth HENT: [place]/AT, PERRL, EOM-I and DMM Lungs: Decreased BS diffusely Cardiovascular: RRR, Nl S1/S2 and -M/R/G Abdomen: Soft, NT, ND and +BS GU: Voids Neuro: Awake and interactive, less lethargic than yesterday Extremity: Warm dry no significant edema  Resolved Hospital Problem list     Assessment & Plan:  Acute on chronic versus chronic hypoxic respiratory failure in the setting of advanced pulmonary fibrosis/UIP -Diagnostic data today: Rheumatoid factor elevated at 14, CPP antibodies negative, sed rate 22, ANA negative ENA antibody IFA positive. -Has referral to ILD clinic pending -Had not been getting prednisone since discharge from the hospital, apparently had clinical response to  this Plan Titrate O2 for sat of 88-92% BiPAP to PRN (O2 demand is minimal now) Steroids as ordered  Maintain fluid even at this point Full DNR May benefit from palliative care encounter Hold off metformin given renal function  Recent pulmonary emboli, these were small and unlikely contributing to his hypoxia Plan Heparin per pharmacy Will need NOAC or LMWH upon discharge given coumadin failure  History of atrial  fibrillation Plan Telemetry monitoring Heparin as above Continue CCB for now  Prostate cancer Plan Outpatient follow-up if appropriate  Diabetes Plan SSI Metformin as mentioned above if renal function improved/stable  Stage III chronic kidney disease Plan Trend chemistries renal dose medications as appropriate Replace electrolytes as indicated  Discussed with PCCM-NP.  Best practice:  Diet: heart healthy diet, no intubation pain/Anxiety/Delirium protocol (if indicated): Not applicable VAP protocol (if indicated): Not applicable DVT prophylaxis: IV heparin GI prophylaxis: Not applicable Glucose control: Sliding scale insulin Mobility: Bedrest Code Status: DO NOT RESUSCITATE Family Communication: Wife updated Disposition: Stepdown unit  Labs   CBC: Recent Labs  Lab 01/12/18 1508 01/13/18 0351 01/14/18 0440 01/15/18 0514 01/18/18 1045  WBC 9.7 7.8 17.6* 14.0* 21.7*  NEUTROABS 6.4  --  15.9* 11.0* 18.4*  HGB 11.2* 11.4* 11.3* 10.7* 12.2*  HCT 35.6* 35.3* 35.0* 34.0* 39.8  MCV 93.2 91.5 93.6 95.2 92.6  PLT 309 280 277 293 427    Basic Metabolic Panel: Recent Labs  Lab 01/13/18 0351 01/14/18 0440 01/15/18 0514 01/18/18 1045 01/19/18 0939  NA 135 132* 137 135 137  K 4.4 4.2 4.5 4.5 4.4  CL 101 100 103 99 100  CO2 21* 22 26 22 22   GLUCOSE 204* 307* 205* 230* 277*  BUN 27* 33* 36* 28* 43*  CREATININE 1.40* 1.43* 1.47* 1.53* 1.71*  CALCIUM 8.2* 8.2* 8.3* 8.6* 8.3*  MG  --  2.2 2.4 1.9  --   PHOS  --  3.2 3.0  --   --    GFR: Estimated Creatinine Clearance: 40.3 mL/min (A) (by C-G formula based on SCr of 1.71 mg/dL (H)). Recent Labs  Lab 01/13/18 0351 01/14/18 0440 01/15/18 0514 01/18/18 1045 01/18/18 1507  WBC 7.8 17.6* 14.0* 21.7*  --   LATICACIDVEN  --   --   --   --  1.10    Liver Function Tests: Recent Labs  Lab 01/12/18 1508 01/14/18 0440 01/15/18 0514 01/18/18 1045  AST 24 25 21 28   ALT 14 17 17 19   ALKPHOS 74 79 79 86  BILITOT  0.5 0.4 0.5 0.8  PROT 6.7 6.4* 6.0* 6.5  ALBUMIN 3.0* 2.9* 2.8* 2.7*   No results for input(s): LIPASE, AMYLASE in the last 168 hours. No results for input(s): AMMONIA in the last 168 hours.  ABG    Component Value Date/Time   PHART 7.428 01/18/2018 1056   PCO2ART 32.1 01/18/2018 1056   PO2ART 118.0 (H) 01/18/2018 1056   HCO3 21.2 01/18/2018 1056   TCO2 22 01/18/2018 1056   ACIDBASEDEF 2.0 01/18/2018 1056   O2SAT 99.0 01/18/2018 1056     Coagulation Profile: Recent Labs  Lab 01/14/18 0440 01/15/18 0514 01/17/18 1728 01/18/18 1045 01/19/18 0330  INR 2.86 3.85 2.4* 2.66 3.48    Cardiac Enzymes: No results for input(s): CKTOTAL, CKMB, CKMBINDEX, TROPONINI in the last 168 hours.  HbA1C: Hgb A1c MFr Bld  Date/Time Value Ref Range Status  10/14/2017 07:52 AM 6.6 (H) 4.6 - 6.5 % Final    Comment:    Glycemic Control  Guidelines for People with Diabetes:Non Diabetic:  <6%Goal of Therapy: <7%Additional Action Suggested:  >8%   04/16/2017 10:02 AM 7.9 (H) 4.8 - 5.6 % Final    Comment:    (NOTE)         Prediabetes: 5.7 - 6.4         Diabetes: >6.4         Glycemic control for adults with diabetes: <7.0     CBG: Recent Labs  Lab 01/18/18 1733 01/19/18 0023 01/19/18 0439 01/19/18 0833 01/19/18 1149  GLUCAP 268* 242* 264* 229* 238*    Review of Systems:   Not able  Past Medical History  He,  has a past medical history of Arthritis, Asthma, Chronic atrial fibrillation, Diverticulosis of colon (without mention of hemorrhage), ED (erectile dysfunction), Fibromyalgia, GERD (gastroesophageal reflux disease), Hard of hearing, Headache, History of blood clots, History of colon polyps, History of kidney stones, diabetes mellitus, gout, Inflammation of colonic mucosa, IPF (idiopathic pulmonary fibrosis) ([place]), Obesity, Peripheral vascular disease ([place]), Pneumonia, Prostate cancer ([place]), Pulmonary embolism ([place]), Restless legs syndrome (RLS), Skin cancer, Unspecified  asthma(493.90), and Unspecified essential hypertension.   Surgical History    Past Surgical History:  Procedure Laterality Date  . ANTERIOR CERVICAL DECOMP/DISCECTOMY FUSION N/A 05/09/2013   Procedure: ANTERIOR CERVICAL DECOMPRESSION/DISCECTOMY FUSION CERVICAL THREE-FOUR,CERVICAL FOUR-FIVE,CERVICAL FIVE-SIX;  Surgeon: Floyce Stakes, MD;  Location: [facility];  Service: Neurosurgery;  Laterality: N/A;  . ARTERY REPAIR     Left forearm  . BACK SURGERY     x 3  . BREATH TEK H PYLORI  01/08/2011   Procedure: BREATH TEK H PYLORI;  Surgeon: Pedro Earls, MD;  Location: [facility];  Service: General;  Laterality: N/A;  . CARDIOVERSION     x 2 attempts-unsuccessful.  [name] CATARACT EXTRACTION, BILATERAL    . COLONOSCOPY    . CYSTOSCOPY    . ESOPHAGOGASTRODUODENOSCOPY (EGD) WITH PROPOFOL N/A 09/21/2014   Procedure: ESOPHAGOGASTRODUODENOSCOPY (EGD) WITH PROPOFOL;  Surgeon: Manya Silvas, MD;  Location: [facility] ENDOSCOPY;  Service: Endoscopy;  Laterality: N/A;  . EYE SURGERY     cataract bil  . FOOT SURGERY     Left foot   . GASTRIC ROUX-EN-Y  08/11/2011   Procedure: LAPAROSCOPIC ROUX-EN-Y GASTRIC BYPASS WITH UPPER ENDOSCOPY;  Surgeon: Pedro Earls, MD;  Location: [facility]S;  Service: General;  Laterality: N/A;  . HAND SURGERY     LEFT  . injections in back     x 18  . JOINT REPLACEMENT    . KNEE SURGERY Left    x 4  . KNEE SURGERY Left    arthroscopy  . LAPAROSCOPIC INTERNAL HERNIA REPAIR N/A 03/08/2015   Procedure: LAPAROSCOPIC INTERNAL HERNIA REPAIR ;  Surgeon: [name], MD;  Location: [facility];  Service: General;  Laterality: N/A;  . LEG SURGERY     FOR NECROTIZING FASCITIS L LEG AND GROIN  . PANNICULECTOMY N/A 08/25/2016   Procedure: PANNICULECTOMY;  Surgeon: [name], MD;  Location: [facility];  Service: General;  Laterality: N/A;  . PENILE PROSTHESIS IMPLANT N/A 07/02/2014   Procedure: PENILE PROTHESIS INFLATABLE 3 PIECE (COLOPLAST) SCROTAL APPROACH;  Surgeon: Kathie Rhodes, MD;  Location: [facility];  Service: Urology;  Laterality: N/A;  . PENILE PROSTHESIS IMPLANT N/A 11/23/2014   Procedure: EXPLORATION AND REVISION OF PENILE PROSTHESIS;  Surgeon: Kathie Rhodes, MD;  Location: [facility];  Service: Urology;  Laterality: N/A;  . PICC INSERTION W/OUT PORT/PUMP    . REPLACEMENT TOTAL KNEE Left  x 2  . SHOULDER ARTHROSCOPY    . SHOULDER SURGERY Left 2014  . TONSILLECTOMY    . TOTAL KNEE ARTHROPLASTY Right 04/27/2017  . TOTAL KNEE ARTHROPLASTY Right 04/27/2017   Procedure: TOTAL KNEE ARTHROPLASTY;  Surgeon: Melrose Nakayama, MD;  Location: [facility];  Service: Orthopedics;  Laterality: Right;  . TOTAL SHOULDER ARTHROPLASTY Left 01/26/2013   Procedure: TOTAL SHOULDER ARTHROPLASTY;  Surgeon: Nita Sells, MD;  Location: [place];  Service: Orthopedics;  Laterality: Left;  Left total shoulder arthroplasty     Social History   reports that he has never smoked. He has never used smokeless tobacco. He reports that he does not drink alcohol or use drugs.   Family History   His family history includes Alzheimer's disease in his sister; Breast cancer in his mother; Cancer in his mother; Colon cancer (age of onset: 13) in his brother; Emphysema in his father; Heart attack in his brother; Prostate cancer in his brother; Uterine cancer in his mother.   Allergies Allergies  Allergen Reactions  . Sulfacetamide Sodium Swelling    throat swelling  . Latex Itching    Severe contact dermatitis  . Adhesive [Tape] Other (See Comments)    Tears skin-- "No tape of any kind, they all tear skin right off"     Home Medications  Prior to Admission medications   Medication Sig Start Date End Date Taking? Authorizing Provider  acarbose (PRECOSE) 50 MG tablet Take 50 mg by mouth 3 (three) times daily with meals.   Yes [provider]  atorvastatin (LIPITOR) 80 MG tablet Take 1 tablet (80 mg total) by mouth at bedtime. 02/18/17  Yes Minus Breeding, MD  benzonatate  (TESSALON) 200 MG capsule Take 1 capsule (200 mg total) by mouth 3 (three) times daily as needed for cough. 01/14/18  Yes Sheikh, Omair Latif, DO  chlorpheniramine-HYDROcodone (TUSSIONEX) 10-8 MG/5ML SUER Take 5 mLs by mouth every 12 (twelve) hours as needed for cough. Patient taking differently: Take 1.5 mLs by mouth every 12 (twelve) hours as needed for cough.  01/05/18  Yes Lesleigh Noe, MD  FLUoxetine (PROZAC) 20 MG capsule TAKE 3 CAPSULES BY MOUTH EVERYDAY AT BEDTIME Patient taking differently: Take 60 mg by mouth at bedtime.  01/10/18  Yes Bedsole, Amy E, MD  furosemide (LASIX) 20 MG tablet Take 1 tablet (20 mg total) by mouth every other day. Patient taking differently: Take 20 mg by mouth daily.  08/10/17  Yes Duke, Tami Lin, PA  gabapentin (NEURONTIN) 300 MG capsule TAKE 1 CAPSULE BY MOUTH TWICE A DAY Patient taking differently: Take 300 mg by mouth 2 (two) times daily.  08/19/17  Yes Tat, Eustace Quail, DO  hydrocortisone 2.5 % cream Apply 1 application topically 2 (two) times daily as needed (itching).  12/09/17  Yes [provider]  hydroxypropyl methylcellulose / hypromellose (ISOPTO TEARS / GONIOVISC) 2.5 % ophthalmic solution Place 1 drop into both eyes 2 (two) times daily as needed for dry eyes.   Yes [provider]  Omega-3 Fatty Acids (FISH OIL) 1000 MG CAPS Take 1,000 mg by mouth daily.   Yes [provider]  omeprazole (PRILOSEC) 40 MG capsule TAKE 1 CAPSULE (40 MG TOTAL) BY MOUTH DAILY. Patient taking differently: Take 40 mg by mouth at bedtime.  11/11/17  Yes Bedsole, Amy E, MD  predniSONE (DELTASONE) 10 MG tablet 4 tabs x 4 days, 3 tabs x 4 days; 2 tabs x 4 days; 1 tab x 4 days 01/17/18  Yes Martyn Ehrich, NP  PRESCRIPTION MEDICATION Apply 1 g topically 4 (four) times daily as needed (pain). Baclofen/Gabapentin/Lidocaine Compund Cream   Yes [provider]  primidone (MYSOLINE) 50 MG tablet 150 mg in the morning, 100 mg at night Patient  taking differently: Take 100-150 mg by mouth See admin instructions. 150 mg in the morning, 100 mg at night 03/08/17  Yes Tat, Eustace Quail, DO  ranitidine (ZANTAC) 300 MG tablet Take 300 mg by mouth at bedtime.  12/03/17  Yes [provider]  sucralfate (CARAFATE) 1 g tablet Take 1 g by mouth 3 (three) times daily before meals.  12/21/17  Yes [provider]  tamsulosin (FLOMAX) 0.4 MG CAPS capsule Take 0.4 mg by mouth at bedtime.  01/03/18  Yes [provider]  tiZANidine (ZANAFLEX) 4 MG tablet Take 4 mg by mouth every 6 (six) hours as needed for muscle spasms.  12/20/17  Yes [provider]  vitamin E 400 UNIT capsule Take 400 Units by mouth daily.   Yes [provider]  warfarin (COUMADIN) 2.5 MG tablet Take 2.5 mg by mouth daily. Take 2.5mg  SUN and THUR and 3.75mg  on all other days.   Yes [provider]  zolpidem (AMBIEN CR) 12.5 MG CR tablet Take 12.5 mg by mouth at bedtime.   Yes [provider]  ipratropium-albuterol (DUONEB) 0.5-2.5 (3) MG/3ML SOLN Take 3 mLs by nebulization every 6 (six) hours as needed. Patient taking differently: Take 3 mLs by nebulization every 6 (six) hours as needed (shortness of breath).  01/17/18   Martyn Ehrich, NP    PCCM will continue to follow.  Rush Farmer, M.D. [facility] Pulmonary/Critical Care Medicine. Pager: [phone]. After hours pager: [phone].

## 2018-01-19 NOTE — Progress Notes (Signed)
PROGRESS NOTE    [name]  HAL:[id number] DOB: [birth year] DOA: 01/18/2018 PCP: Jinny Sanders, MD    Brief Narrative:  77 year old male who presented with a fall and chest pain.  He does have significant past medical history for hypertension, history of PE, peripheral vascular disease, type 2 diabetes mellitus, atrial fibrillation, status post gastric bypass, chronic hypoxic respiratory failure due to interstitial lung disease and history of prostate cancer.  Patient was recently seen in the pulmonary clinic after hospitalization at [facility], he was placed on steroids and told to increase his oxygen flow, patient has been feeling very weak over the last few days, on the day of admission he had a mechanical fall and he was brought to the hospital for further evaluation.  Physical examination he was in respiratory distress, he was placed on a BiPAP, blood pressure 93/77, heart rate 133, respirate 28, oxygen saturation 97% (BiPAP), he was awake and alert, heart S1-S2 present, tachycardic, lungs with decreased lung movement, positive accessory muscle use, abdomen soft nontender, no lower extremity edema.   Patient was admitted to the hospital working diagnosis acute on chronic hypoxic respiratory failure related to idiopathic pulmonary fibrosis.   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 Acute on chronic hypoxemic respiratory failure IPF exacerbation. Will continue supportive medical therapy with high dose systemic steroids, along with bronchodilators. Will do a trial of high flow nasal cannula this am and continue using bipap as needed. Patient is very weak  and deconditioned.   2. Pulmonary embolism. Will continue anticoagulation with heparin, follow pharmacy protocol.   3. T2DM. Will continue glucose cover and monitoring with insulin sliding scale.   4. AKI on CKD stage 3. Worsening renal function, now serum cr up to 1,71 with K at 4,4 and serum bicarbonate at 22, avoid hypotension or nephrotoxic medications. Will hold on furosemide for now, patient clinically dry.   5. Prostate Cancer. Will need outpatient follow up.   6. New onset atrial fibrillation complicated with acute diastolic heart failure. Rate is controlled, will wean drip and will continue rate control with low dose b blocker, metoprolol. Chest film with chronic fibrosis at bases. Will hold on furosemide for now, recent echocardiogram with preserved LV systolic function.   DVT prophylaxis: heparin   Code Status: dnr Family Communication: I spoke with patient's family at the bedside and all questions were addressed.  Disposition Plan/ discharge barriers: pending clinical improvement.   There is no height or weight on file to calculate BMI. Malnutrition Type:      Malnutrition Characteristics:      Nutrition Interventions:     RN Pressure Injury Documentation:     Consultants:   Pulmonary   Procedures:     Antimicrobials:       Subjective: Patient has been on full face mask bipap, most information from his wife and nursing at the bedside. Patient is uncomfortable on full face mask, willing to take it off, very weak and deconditioned, continue to have dyspnea, no nausea or vomiting.   Objective: Vitals:   01/19/18 0025 01/19/18 0405 01/19/18 0436 01/19/18 0750  BP: (!) 108/93 (!) 112/18 106/76  Pulse: (!) 25 84 84 87  Resp: 18 (!) 27 (!) 27 (!) 21  Temp: 98.6 F (37 C)  98.4 F (36.9 C)   TempSrc: Axillary  Axillary   SpO2: 96% 95% 95% 98%   No intake or output data in the 24 hours ending 01/19/18 0812 There were no vitals filed for this  visit.  Examination:   General: deconditioned and ill looking appearing  Neurology: Awake and alert, non focal  E ENT: positive pallor, no icterus, oral mucosa dry Cardiovascular: No JVD. S1-S2 present, rhythmic, no gallops, rubs, or murmurs. No lower extremity edema. Pulmonary: decreased breath sounds bilaterally,poor air movement, no wheezing, rhonchi, but bilateral diffuse rales. Gastrointestinal. Abdomen with no organomegaly, non tender, no rebound or guarding Skin. No rashes Musculoskeletal: no joint deformities     Data Reviewed: I have personally reviewed following labs and imaging studies  CBC: Recent Labs  Lab 01/12/18 1508 01/13/18 0351 01/14/18 0440 01/15/18 0514 01/18/18 1045  WBC 9.7 7.8 17.6* 14.0* 21.7*  NEUTROABS 6.4  --  15.9* 11.0* 18.4*  HGB 11.2* 11.4* 11.3* 10.7* 12.2*  HCT 35.6* 35.3* 35.0* 34.0* 39.8  MCV 93.2 91.5 93.6 95.2 92.6  PLT 309 280 277 293 001   Basic Metabolic Panel: Recent Labs  Lab 01/12/18 1508 01/13/18 0351 01/14/18 0440 01/15/18 0514 01/18/18 1045  NA 135 135 132* 137 135  K 4.6 4.4 4.2 4.5 4.5  CL 100 101 100 103 99  CO2 26 21* 22 26 22   GLUCOSE 140* 204* 307* 205* 230*  BUN 26* 27* 33* 36* 28*  CREATININE 1.46* 1.40* 1.43* 1.47* 1.53*  CALCIUM 8.3* 8.2* 8.2* 8.3* 8.6*  MG  --   --  2.2 2.4 1.9  PHOS  --   --  3.2 3.0  --    GFR: Estimated Creatinine Clearance: 45.1 mL/min (A) (by C-G formula based on SCr of 1.53 mg/dL (H)). Liver Function Tests: Recent Labs  Lab 01/12/18 1508 01/14/18 0440 01/15/18 0514 01/18/18 1045  AST 24 25 21 28   ALT 14 17 17 19   ALKPHOS 74 79 79 86  BILITOT 0.5 0.4 0.5 0.8  PROT 6.7 6.4* 6.0* 6.5  ALBUMIN 3.0* 2.9* 2.8* 2.7*   No results for input(s): LIPASE, AMYLASE in the last 168 hours. No results for input(s): AMMONIA in the last 168 hours. Coagulation Profile: Recent Labs  Lab 01/14/18 0440 01/15/18 0514 01/17/18 1728 01/18/18 1045 01/19/18 0330  INR 2.86 3.85 2.4* 2.66  3.48   Cardiac Enzymes: No results for input(s): CKTOTAL, CKMB, CKMBINDEX, TROPONINI in the last 168 hours. BNP (last 3 results) Recent Labs    01/10/18 1657  PROBNP 123.0*   HbA1C: No results for input(s): HGBA1C in the last 72 hours. CBG: Recent Labs  Lab 01/15/18 0731 01/15/18 1125 01/18/18 1733 01/19/18 0023 01/19/18 0439  GLUCAP 186* 173* 268* 242* 264*   Lipid Profile: No results for input(s): CHOL, HDL, LDLCALC, TRIG, CHOLHDL, LDLDIRECT in the last 72 hours. Thyroid Function Tests: No results for input(s): TSH, T4TOTAL, FREET4, T3FREE, THYROIDAB in the last 72 hours. Anemia Panel: No results for input(s): VITAMINB12, FOLATE, FERRITIN, TIBC, IRON, RETICCTPCT in the last 72 hours.    Radiology Studies: I have reviewed all of the imaging during this hospital visit personally     Scheduled Meds: . atorvastatin  80 mg Oral QHS  . feeding supplement (ENSURE ENLIVE)  237 mL Oral BID BM  . FLUoxetine  60 mg Oral QHS  . furosemide  40 mg Intravenous Q12H  . insulin aspart  0-15 Units Subcutaneous TID WC  . insulin aspart  0-5 Units Subcutaneous QHS  . ipratropium-albuterol  3 mL Nebulization TID  . methylPREDNISolone (SOLU-MEDROL) injection  125 mg Intravenous Q6H  . sodium chloride flush  3 mL Intravenous Q12H   Continuous Infusions: . diltiazem (CARDIZEM) infusion 5 mg/hr (01/19/18 0541)     LOS: 1 day        Kalysta Kneisley Gerome Apley, MD [facility] Pager [phone]

## 2018-01-19 NOTE — Telephone Encounter (Signed)
Referral done

## 2018-01-19 NOTE — Telephone Encounter (Signed)
-----   Message from Cordelia Poche, RN sent at 01/18/2018 10:30 AM EST ----- Regarding: RE: Eliquis assistance. Good morning,  There are Eliquis assistance programs. We have hard copies of copay cards here in our office, but my understanding is these cards can also be downloaded at [employer]. This copay card is for commercially insured patients.  If the patient is uninsured or underinsured, [employer]quis also offers free drug. This is also available from their website, but the MD office will need to complete a portion of this application.  If you need further assistance, Otilio Carpen is the person in this office who helps facilitate all the drug assistance programs with our patients.  Have a good day,  Jamie  ----- Message ----- From: Tish Men, MD Sent: 01/18/2018  10:24 AM EST To: Martyn Ehrich, NP, Onc Nurse Hp Subject: Eliquis assistance.                            Hi Marijke Guadiana,  I have included my nursing staff to see if they can give you some help on getting the patient on Eliquis assistance program.  Thanks.  [place]

## 2018-01-20 DIAGNOSIS — Z7189 Other specified counseling: Secondary | ICD-10-CM

## 2018-01-20 DIAGNOSIS — J849 Interstitial pulmonary disease, unspecified: Secondary | ICD-10-CM

## 2018-01-20 DIAGNOSIS — E43 Unspecified severe protein-calorie malnutrition: Secondary | ICD-10-CM

## 2018-01-20 DIAGNOSIS — Z515 Encounter for palliative care: Secondary | ICD-10-CM

## 2018-01-20 LAB — BASIC METABOLIC PANEL
ANION GAP: 12 (ref 5–15)
BUN: 54 mg/dL — ABNORMAL HIGH (ref 8–23)
CO2: 24 mmol/L (ref 22–32)
Calcium: 8.1 mg/dL — ABNORMAL LOW (ref 8.9–10.3)
Chloride: 99 mmol/L (ref 98–111)
Creatinine, Ser: 1.71 mg/dL — ABNORMAL HIGH (ref 0.61–1.24)
GFR calc Af Amer: 44 mL/min — ABNORMAL LOW (ref >60)
GFR calc non Af Amer: 38 mL/min — ABNORMAL LOW (ref >60)
Glucose, Bld: 323 mg/dL — ABNORMAL HIGH (ref 70–99)
Potassium: 4.9 mmol/L (ref 3.5–5.1)
Sodium: 135 mmol/L (ref 135–145)

## 2018-01-20 LAB — URINE CULTURE: Culture: NO GROWTH

## 2018-01-20 LAB — GLUCOSE, CAPILLARY
GLUCOSE-CAPILLARY: 273 mg/dL — AB (ref 70–99)
Glucose-Capillary: 298 mg/dL — ABNORMAL HIGH (ref 70–99)
Glucose-Capillary: 315 mg/dL — ABNORMAL HIGH (ref 70–99)
Glucose-Capillary: 336 mg/dL — ABNORMAL HIGH (ref 70–99)
Glucose-Capillary: 351 mg/dL — ABNORMAL HIGH (ref 70–99)

## 2018-01-20 LAB — PROTIME-INR
INR: 4.26
Prothrombin Time: 40.3 seconds — ABNORMAL HIGH (ref 11.4–15.2)

## 2018-01-20 MED ORDER — GUAIFENESIN-DM 100-10 MG/5ML PO SYRP
5.0000 mL | ORAL_SOLUTION | ORAL | Status: DC | PRN
  Administered 2018-01-20 – 2018-01-21 (×4): 5 mL via ORAL
  Filled 2018-01-20 (×4): qty 5

## 2018-01-20 MED ORDER — INSULIN DETEMIR 100 UNIT/ML ~~LOC~~ SOLN
10.0000 [IU] | Freq: Every day | SUBCUTANEOUS | Status: DC
  Administered 2018-01-20 – 2018-01-24 (×5): 10 [IU] via SUBCUTANEOUS
  Filled 2018-01-20 (×6): qty 0.1

## 2018-01-20 NOTE — Progress Notes (Signed)
Palliative: [name] is resting quietly in bed.  He greets me making and keeping eye contact.  Although he looks improved from yesterday, he still appears very weak and frail, chronically ill.  Present today at bedside is wife [name] and daughter [name].  [facility]  Family states that [name] was there in 2011 after a blood infection.  We talked about the difference 8 years can make and how he is able to recover.  I feel like patient and family are realistic.   We talked about the chronic illness pathway, what is normal and expected, I share a diagram.  We also talked about recovery potential including quicker the recovery the more recovery is expected.  We talked about meaningful improvement versus waxing and waning.  [name] asks about how long this road is for him.  We talked about prognosis with permission.  I share that it remains to be seen what his time will look like.  We talked about the improvements he has made in the last couple of days, but that he still has chronic progressive pulmonary disease that is incurable.  Patient and family family is aware and accepting.  We talked about how to make choices for loved ones if they are unable including 1) keeping them at the center of decision-making, 2) are we doing something for them or to them, 3) what would the person he was 5 years ago say about his life now.  We talked about what gives him pleasure in life, he tells me that meeting others, giving candy and making other smile.  I share the importance of him being safe with exposure to people with illness, germs.  We talked about how to stay safe, reduce exposure.  We talked about nutrition.  I encouraged [name] to do the best he can.  Family talks about his chronic GERD that they feel is not well controlled.  I encouraged them to work for resolution with specialist.  We also talked about medication holiday.  Conversation with hospitalist  related to goals of care discussion.  65 minutes, extended time [name], NP Palliative Medicine Team [phone] Greater than 50%  of this time was spent counseling and coordinating care related to the above assessment and plan.

## 2018-01-20 NOTE — Progress Notes (Signed)
BIPAP prn at bedside- pt resting comfortably no distress at time of check.

## 2018-01-20 NOTE — Progress Notes (Signed)
Inpatient Diabetes Program Recommendations  AACE/ADA: New Consensus Statement on Inpatient Glycemic Control (2015)  Target Ranges:  Prepandial:   less than 140 mg/dL      Peak postprandial:   less than 180 mg/dL (1-2 hours)      Critically ill patients:  140 - 180 mg/dL   Lab Results  Component Value Date   GLUCAP 351 (H) 01/20/2018   HGBA1C 6.6 (H) 10/14/2017    Review of Glycemic Control Results for [name], [name] (MRN [id number]) as of 01/20/2018 12:29  Ref. Range 01/20/2018 00:31 01/20/2018 08:15 01/20/2018 11:44  Glucose-Capillary Latest Ref Range: 70 - 99 mg/dL 315 (H) 336 (H) 351 (H)   Diabetes history: Type 2 DM Outpatient Diabetes medications: none Current orders for Inpatient glycemic control: Novolog 0-15 units TID, Novolog 0-5 units QHS Solumedrol 125 mg Q6H  Inpatient Diabetes Program Recommendations:    In the setting of steroids, blood glucose trends are increased.   Consider: - adding a portion of basal insulin: Levemir 10 units QD. - If post prandials continue to exceed 180 mg/dL consider adding Novolog 4 units TID (assuming patient is consuming >50% of meals).  -Lastly, consider repeating A1C, last result was from 10/2017.  Thanks, Bronson Curb, MSN, RNC-OB Diabetes Coordinator [phone] (8a-5p)

## 2018-01-20 NOTE — Progress Notes (Signed)
ANTICOAGULATION CONSULT NOTE - Initial Consult  Pharmacy Consult for Heparin when INR <2 Indication: atrial fibrillation and pulmonary embolus (dx 12/4)  Allergies  Allergen Reactions  . Sulfacetamide Sodium Swelling    throat swelling  . Latex Itching    Severe contact dermatitis  . Adhesive [Tape] Other (See Comments)    Tears skin-- "No tape of any kind, they all tear skin right off"    Patient Measurements: Height: 6' (182.9 cm) Weight: 177 lb 0.5 oz (80.3 kg) IBW/kg (Calculated) : 77.6 Heparin Dosing Weight: 80 kg  Vital Signs: Temp: 97.6 F (36.4 C) (12/12 0400) Temp Source: Oral (12/12 0400) BP: 123/78 (12/12 0600) Pulse Rate: 82 (12/12 0600)  Labs: Recent Labs    01/18/18 1045 01/19/18 0330 01/19/18 0939 01/20/18 0246  HGB 12.2*  --   --   --   HCT 39.8  --   --   --   PLT 200  --   --   --   LABPROT 28.0* 34.4*  --  40.3*  INR 2.66 3.48  --  4.26*  CREATININE 1.53*  --  1.71* 1.71*    Estimated Creatinine Clearance: 40.3 mL/min (A) (by C-G formula based on SCr of 1.71 mg/dL (H)).   Medical History: Past Medical History:  Diagnosis Date  . Arthritis    hands   . Asthma   . Chronic atrial fibrillation    a.fib on warfarin- Dr. Percival Spanish follows  . Diverticulosis of colon (without mention of hemorrhage)   . ED (erectile dysfunction)   . Fibromyalgia   . GERD (gastroesophageal reflux disease)   . Hard of hearing    wears hearing aids  . Headache   . History of blood clots    behind right knee and then went into left lung 32yrs ago  . History of colon polyps   . History of kidney stones   . Hx of diabetes mellitus    "no longer diabetic since gastric bypass" - no longer taking metformin"  in 2 months "problems with low blood sugar now"  . Hx of gout    but doesn't take any meds  . Inflammation of colonic mucosa    recent admission and release from [facility] 02-28-15-remains on oral antibiotic  . IPF (idiopathic pulmonary fibrosis) ([place])   . Obesity    . Peripheral vascular disease ([place])   . Pneumonia    last time about 55yrs ago  . Prostate cancer ([place])   . Pulmonary embolism ([place])    over 10 yrs ago "many years ago"  . Restless legs syndrome (RLS)   . Skin cancer   . Unspecified asthma(493.90)    as a child  . Unspecified essential hypertension    hx of no longer on medication due to gastric bypass     Medications:  Medications Prior to Admission  Medication Sig Dispense Refill Last Dose  . acarbose (PRECOSE) 50 MG tablet Take 50 mg by mouth 3 (three) times daily with meals.   01/17/2018 at Unknown time  . atorvastatin (LIPITOR) 80 MG tablet Take 1 tablet (80 mg total) by mouth at bedtime. 90 tablet 3 01/17/2018 at Unknown time  . benzonatate (TESSALON) 200 MG capsule Take 1 capsule (200 mg total) by mouth 3 (three) times daily as needed for cough. 20 capsule 0 01/17/2018 at Unknown time  . chlorpheniramine-HYDROcodone (TUSSIONEX) 10-8 MG/5ML SUER Take 5 mLs by mouth every 12 (twelve) hours as needed for cough. (Patient taking differently: Take 1.5 mLs  by mouth every 12 (twelve) hours as needed for cough. ) 140 mL 0 Past Week at Unknown time  . FLUoxetine (PROZAC) 20 MG capsule TAKE 3 CAPSULES BY MOUTH EVERYDAY AT BEDTIME (Patient taking differently: Take 60 mg by mouth at bedtime. ) 270 capsule 1 01/17/2018 at Unknown time  . furosemide (LASIX) 20 MG tablet Take 1 tablet (20 mg total) by mouth every other day. (Patient taking differently: Take 20 mg by mouth daily. ) 180 tablet 1 01/17/2018 at Unknown time  . gabapentin (NEURONTIN) 300 MG capsule TAKE 1 CAPSULE BY MOUTH TWICE A DAY (Patient taking differently: Take 300 mg by mouth 2 (two) times daily. ) 180 capsule 1 01/17/2018 at Unknown time  . hydrocortisone 2.5 % cream Apply 1 application topically 2 (two) times daily as needed (itching).   2 Past Week at Unknown time  . hydroxypropyl methylcellulose / hypromellose (ISOPTO TEARS / GONIOVISC) 2.5 % ophthalmic solution Place 1 drop into  both eyes 2 (two) times daily as needed for dry eyes.   Past Week at Unknown time  . Omega-3 Fatty Acids (FISH OIL) 1000 MG CAPS Take 1,000 mg by mouth daily.   01/17/2018 at Unknown time  . omeprazole (PRILOSEC) 40 MG capsule TAKE 1 CAPSULE (40 MG TOTAL) BY MOUTH DAILY. (Patient taking differently: Take 40 mg by mouth at bedtime. ) 90 capsule 1 01/17/2018 at Unknown time  . predniSONE (DELTASONE) 10 MG tablet 4 tabs x 4 days, 3 tabs x 4 days; 2 tabs x 4 days; 1 tab x 4 days 40 tablet 0 01/17/2018 at Unknown time  . PRESCRIPTION MEDICATION Apply 1 g topically 4 (four) times daily as needed (pain). Baclofen/Gabapentin/Lidocaine Compund Cream   01/17/2018 at Unknown time  . primidone (MYSOLINE) 50 MG tablet 150 mg in the morning, 100 mg at night (Patient taking differently: Take 100-150 mg by mouth See admin instructions. 150 mg in the morning, 100 mg at night) 450 tablet 1 01/17/2018 at Unknown time  . ranitidine (ZANTAC) 300 MG tablet Take 300 mg by mouth at bedtime.   3 01/17/2018 at Unknown time  . sucralfate (CARAFATE) 1 g tablet Take 1 g by mouth 3 (three) times daily before meals.   6 01/18/2018 at Unknown time  . tamsulosin (FLOMAX) 0.4 MG CAPS capsule Take 0.4 mg by mouth at bedtime.    01/17/2018 at Unknown time  . tiZANidine (ZANAFLEX) 4 MG tablet Take 4 mg by mouth every 6 (six) hours as needed for muscle spasms.    Past Month at Unknown time  . vitamin E 400 UNIT capsule Take 400 Units by mouth daily.   01/17/2018 at Unknown time  . warfarin (COUMADIN) 2.5 MG tablet Take 2.5 mg by mouth daily. Take 2.5mg  SUN and THUR and 3.75mg  on all other days.   01/17/2018 at 2000  . zolpidem (AMBIEN CR) 12.5 MG CR tablet Take 12.5 mg by mouth at bedtime.   01/17/2018 at Unknown time  . ipratropium-albuterol (DUONEB) 0.5-2.5 (3) MG/3ML SOLN Take 3 mLs by nebulization every 6 (six) hours as needed. (Patient taking differently: Take 3 mLs by nebulization every 6 (six) hours as needed (shortness of breath). ) 360 mL 0      Assessment: 77 yo M with recent admission 12/4 > 12/8 for mgmt of pulm disease.  Noted at that time to have new PE on CT scan.  Pt was on Coumadin with therapeutic INR at that time but declined LMWH and was unable to afford Eliqus/Xarelto.  INR supratherapeutic today.  Pharmacy has been asked to initiate heparin when INR <2 for recent PE and hx afib.  INR 4.26 today  Goal of Therapy:  Heparin level 0.3-0.7 units/ml Monitor platelets by anticoagulation protocol: Yes   Plan:  No heparin today. Follow-up with INR in AM.  Goldye Tourangeau A. Levada Dy, PharmD, [place] Pager: [phone] Please utilize Amion for appropriate phone number to reach the unit pharmacist ([place])    01/20/2018 7:30 AM

## 2018-01-20 NOTE — Telephone Encounter (Signed)
Saw pt

## 2018-01-20 NOTE — Progress Notes (Signed)
PROGRESS NOTE    [name]  XFG:[id number] DOB: 1940/11/14 DOA: 01/18/2018 PCP: Jinny Sanders, MD    Brief Narrative:  77 year old male who presented with a fall and chest pain.  He does have significant past medical history for hypertension, history of PE, peripheral vascular disease, type 2 diabetes mellitus, atrial fibrillation, status post gastric bypass, chronic hypoxic respiratory failure due to interstitial lung disease and history of prostate cancer.  Patient was recently seen in the pulmonary clinic after hospitalization at [facility], he was placed on steroids and told to increase his oxygen flow, patient has been feeling very weak over the last few days, on the day of admission he had a mechanical fall and he was brought to the hospital for further evaluation.  Physical examination he was in respiratory distress, he was placed on a BiPAP, blood pressure 93/77, heart rate 133, respirate 28, oxygen saturation 97% (BiPAP), he was awake and alert, heart S1-S2 present, tachycardic, lungs with decreased lung movement, positive accessory muscle use, abdomen soft nontender, no lower extremity edema.   Arterial blood gas pH 7.45, PCO2 32, PO2 118, bicarb 22, oxygen saturation 99%.  Sodium 137 potassium 4.4, chloride 100, bicarb 22, glucose 277, BUN 43 creatinine 1.71.  INR 3.48.  White count 1.7, hemoglobin 12.2, hematocrit 31, platelets 200.  Chest x-ray with fibrotic changes, predominantly at bases. EKG with atrial fibrillation 137 bpm.   Patient was admitted to the hospital working diagnosis acute on chronic hypoxic respiratory failure related to idiopathic pulmonary fibrosis.     Assessment & Plan:   Principal Problem:   Acute on chronic respiratory failure with hypoxia (HCC) Active Problems:   Atrial fibrillation (HCC)   Type 2 diabetes mellitus with neurologic complication (HCC)   Anticoagulant long-term use   Malignant neoplasm of prostate (HCC)   Pulmonary fibrosis  (HCC)   CKD (chronic kidney disease) stage 3, GFR 30-59 ml/min (HCC)   Protein-calorie malnutrition, severe   1. Acute on chronic hypoxemic respiratory failure IPF exacerbation. On high dose systemic steroids, and aggressive bronchodilators. Patient has tolerated well high flow nasal cannula, will continue to use bipap only as needed. Patient likely will need LTAC at discharge.   2. Pulmonary embolism. Continue anticoagulation with wafarin, patient not able to afford novel anticoagulants, inr continue to be supratherapeutic today, continue to follow pharmacy protocol.  3. T2DM with hyperglycemia. Steroids induced worsening hyperglycemia, fasting glucose 323, capillary glucose 362, 315, 336, 351. Will continue insulin coverage with insulin sliding scale and will add long acting insulin with levimir 10 units, patient continue to have very poor oral intake, will hold for pre-meal insulin coverage for now.   4. AKI on CKD stage 3. Renal function has been stable with serum cr at 1,71 with K at 4,9 and serum bicarbonate at 24. Continue to hold diuretic therapy for now. Encourage po intake, follow with renal panel in am, avoid hypotension and nephrotoxic medications.    5. Prostate Cancer. Outpatient follow up,.   6. New onset atrial fibrillation complicated with acute diastolic heart failure. Patient continue to be in atrial fibrillation rhythm, improved rate control with metoprolol, will continue anticoagulation with warfarin. INR this am 4,26.   DVT prophylaxis: heparin   Code Status: dnr Family Communication: I spoke with patient's wife at the bedside and all questions were addressed.  Disposition Plan/ discharge barriers: pending clinical improvement.   Body mass index is 24.01 kg/m. Malnutrition Type:  Nutrition Problem: Severe Malnutrition Etiology: chronic illness(pulmonary  fibrosis)   Malnutrition Characteristics:  Signs/Symptoms: severe fat depletion, severe muscle  depletion   Nutrition Interventions:  Interventions: Ensure Enlive (each supplement provides 350kcal and 20 grams of protein)  RN Pressure Injury Documentation:     Consultants:   Pulmonary   Procedures:     Antimicrobials:       Subjective: Patient has been tolerating well high flow nasal cannula, continue to have dyspnea with minimal efforts, very weak and deconditioned, no nausea or vomiting, but very poor appetite.   Objective: Vitals:   01/20/18 0500 01/20/18 0600 01/20/18 0809 01/20/18 0829  BP: 113/72 123/78 (!) 123/91   Pulse: 73 82 79   Resp: (!) 24 (!) 21 18   Temp:   97.7 F (36.5 C)   TempSrc:   Oral   SpO2: 97% 99% 100% 100%  Weight:      Height:        Intake/Output Summary (Last 24 hours) at 01/20/2018 0934 Last data filed at 01/20/2018 0640 Gross per 24 hour  Intake 858.88 ml  Output 2280 ml  Net -1421.12 ml   Filed Weights   01/19/18 1015  Weight: 80.3 kg    Examination:   General: deconditioned and ill looking appearing  Neurology: Awake and alert, non focal  E ENT: positive  pallor, no icterus, oral mucosa moist Cardiovascular: No JVD. S1-S2 present, rhythmic, no gallops, rubs, or murmurs. No lower extremity edema. Pulmonary: decreased breath sounds bilaterally, poor air movement, no wheezing, or rhonchi positive bilateral rales. Gastrointestinal. Abdomen with no organomegaly, non tender, no rebound or guarding Skin. No rashes Musculoskeletal: no joint deformities     Data Reviewed: I have personally reviewed following labs and imaging studies  CBC: Recent Labs  Lab 01/14/18 0440 01/15/18 0514 01/18/18 1045  WBC 17.6* 14.0* 21.7*  NEUTROABS 15.9* 11.0* 18.4*  HGB 11.3* 10.7* 12.2*  HCT 35.0* 34.0* 39.8  MCV 93.6 95.2 92.6  PLT 277 293 323   Basic Metabolic Panel: Recent Labs  Lab 01/14/18 0440 01/15/18 0514 01/18/18 1045 01/19/18 0939 01/20/18 0246  NA 132* 137 135 137 135  K 4.2 4.5 4.5 4.4 4.9  CL 100 103  99 100 99  CO2 22 26 22 22 24   GLUCOSE 307* 205* 230* 277* 323*  BUN 33* 36* 28* 43* 54*  CREATININE 1.43* 1.47* 1.53* 1.71* 1.71*  CALCIUM 8.2* 8.3* 8.6* 8.3* 8.1*  MG 2.2 2.4 1.9  --   --   PHOS 3.2 3.0  --   --   --    GFR: Estimated Creatinine Clearance: 40.3 mL/min (A) (by C-G formula based on SCr of 1.71 mg/dL (H)). Liver Function Tests: Recent Labs  Lab 01/14/18 0440 01/15/18 0514 01/18/18 1045  AST 25 21 28   ALT 17 17 19   ALKPHOS 79 79 86  BILITOT 0.4 0.5 0.8  PROT 6.4* 6.0* 6.5  ALBUMIN 2.9* 2.8* 2.7*   No results for input(s): LIPASE, AMYLASE in the last 168 hours. No results for input(s): AMMONIA in the last 168 hours. Coagulation Profile: Recent Labs  Lab 01/15/18 0514 01/17/18 1728 01/18/18 1045 01/19/18 0330 01/20/18 0246  INR 3.85 2.4* 2.66 3.48 4.26*   Cardiac Enzymes: No results for input(s): CKTOTAL, CKMB, CKMBINDEX, TROPONINI in the last 168 hours. BNP (last 3 results) Recent Labs    01/10/18 1657  PROBNP 123.0*   HbA1C: No results for input(s): HGBA1C in the last 72 hours. CBG: Recent Labs  Lab 01/19/18 1149 01/19/18 1726 01/19/18 2044 01/20/18 0031  01/20/18 0815  GLUCAP 238* 300* 362* 315* 336*   Lipid Profile: No results for input(s): CHOL, HDL, LDLCALC, TRIG, CHOLHDL, LDLDIRECT in the last 72 hours. Thyroid Function Tests: No results for input(s): TSH, T4TOTAL, FREET4, T3FREE, THYROIDAB in the last 72 hours. Anemia Panel: No results for input(s): VITAMINB12, FOLATE, FERRITIN, TIBC, IRON, RETICCTPCT in the last 72 hours.    Radiology Studies: I have reviewed all of the imaging during this hospital visit personally     Scheduled Meds: . atorvastatin  80 mg Oral QHS  . feeding supplement (ENSURE ENLIVE)  237 mL Oral BID BM  . feeding supplement (GLUCERNA SHAKE)  237 mL Oral TID BM  . FLUoxetine  60 mg Oral QHS  . insulin aspart  0-15 Units Subcutaneous TID WC  . insulin aspart  0-5 Units Subcutaneous QHS  .  ipratropium-albuterol  3 mL Nebulization TID  . methylPREDNISolone (SOLU-MEDROL) injection  125 mg Intravenous Q6H  . metoprolol tartrate  25 mg Oral BID  . sodium chloride flush  3 mL Intravenous Q12H   Continuous Infusions: . diltiazem (CARDIZEM) infusion Stopped (01/19/18 2237)     LOS: 2 days        Elleanor Guyett Gerome Apley, MD [facility] Pager [phone]

## 2018-01-20 NOTE — Progress Notes (Signed)
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  Recent pulmonary emboli, while on Coumadin.  Presented to the emergency room 12/10 after a fall.  Found to be hypoxic with saturations in the 70s pulmonary asked to evaluate.  History of present illness   77 year old male patient who has a significant medical history as mentioned below recently  referred to [facility] pulmonary with new diagnosis of pulmonary fibrosis.  We had seen him in consult at [facility] long on 12/4 for abnormal CT findings as well as pulmonary emboli.  We felt that the pulmonary emboli were small, and there is pulmonary fibrosis.  The overwhelming issue driving his hypoxia.  He was ultimately discharged to home with plan for follow-up in our clinic, prednisone taper, and then referral to ILD clinic.  He was discharged home on supplemental oxygen and supposed to have been on a prednisone taper which he did not get.  The prednisone taper was started in our clinic on hospital follow-up today.  He had been doing quite poorly with marked work of breathing at home on 2 L increasing to 3 L without assistance.  The prednisone was started initially on 12/9.  He presented to the emergency room today on 12/10 after a fall.  He reported he had not tripped or anything but essentially had become quite dizzy resulting in the fall.  He was brought to the emergency room for further evaluation, found to have pulse oximetry in the 70s on 2 L nasal cannula, he was weak, fatigued, slow to respond.  Oxygen was titrated up ultimately up to NIPPV.  Pulmonary asked to assist with acute hypoxic respiratory failure  Past Medical History  Prostate cancer (currently  on coumadin but has been coumadin failure), PE, possible COPD and newly identified pulmonary fibrosis.   Significant Hospital Events     Consults:  Pulmonary  Procedures:    Significant Diagnostic Tests:  CT imaging of C-spine, maxillofacial and head.  All negative for acute fracture or trauma  Micro Data:   Antimicrobials:    Interim history/subjective:  Some improvement in O2 needs, resp status on steroids and also after diuretics (negative balance) New A fib   Objective   Blood pressure 110/73, pulse 76, temperature (!) 97.5 F (36.4 C), temperature source Oral, resp. rate (!) 25, height 6' (1.829 m), weight 80.3 kg, SpO2 100 %.        Intake/Output Summary (Last 24 hours) at 01/20/2018 1715 Last data filed at 01/20/2018 0640 Gross per 24 hour  Intake 858.88 ml  Output 1080 ml  Net -221.12 ml   Filed Weights   01/19/18 1015  Weight: 80.3 kg   Examination: General: Frail gentleman, thin.  He is more comfortable than when I saw him most recently in the office HENT: Oropharynx clear, pupils equal Lungs: Fairly good air movement, bilateral basilar inspiratory crackles Cardiovascular: Irregular, good rate control, no murmur Abdomen: Soft, positive bowel sounds Neuro: Awake and interactive, moves all extremities, follows commands, answers all questions appropriately Extremities: No significant edema  Resolved Hospital Problem list     Assessment & Plan:  Acute on chronic versus chronic hypoxic respiratory failure in the setting of bilateral pulmonary infiltrates.  These  are base predominant and have some associated scar.  Most consistent with interstitial lung disease, probably acute on chronic in the setting of this flare.  Consider also possible pulmonary edema although the base predominant distribution makes this less likely -Diagnostic data - Rheumatoid factor elevated at 14, CPP antibodies negative, sed rate 22, ANA negative ENA antibody IFA positive. -Has  referral to ILD clinic pending -Had not been getting prednisone since discharge from the hospital, apparently had clinical response to this Plan Continue high-dose steroids.  We will plan a decrease and then a steady dose as an outpatient until we can see him in ILD clinic. Plan to repeat his chest imaging but too early to do so now.  Would like to wait until he has been treated for a few weeks to look for interval improvement. Agree with diuresis if his blood pressure and renal function can tolerate Agree with DNR status  Recent pulmonary emboli, these were small and unlikely contributing to his hypoxia Plan Heparin per pharmacy I discussed the options with him and his wife today.  Suppose we need to call him a Coumadin failure since he had small PE while on therapeutic Coumadin.  The best outpatient choice would probably be enoxaparin if he is willing to do this and if he can afford it through his insurance.  Atrial fibrillation Plan Telemetry monitoring Heparin infusion Metoprolol p.o.  Prostate cancer Plan Outpatient follow-up if appropriate  Diabetes Plan SSI Metformin remains on hold  Stage III chronic kidney disease Plan Trend chemistries renal dose medications as appropriate Replace electrolytes as indicated   Best practice:  Diet: heart healthy diet, no intubation pain/Anxiety/Delirium protocol (if indicated): Not applicable VAP protocol (if indicated): Not applicable DVT prophylaxis: IV heparin GI prophylaxis: Not applicable Glucose control: Sliding scale insulin Mobility: Bedrest Code Status: DO NOT RESUSCITATE Family Communication: I updated the patient and his wife on 12/12 Disposition: Stepdown unit  Labs   CBC: Recent Labs  Lab 01/14/18 0440 01/15/18 0514 01/18/18 1045  WBC 17.6* 14.0* 21.7*  NEUTROABS 15.9* 11.0* 18.4*  HGB 11.3* 10.7* 12.2*  HCT 35.0* 34.0* 39.8  MCV 93.6 95.2 92.6  PLT 277 293 517    Basic Metabolic Panel: Recent Labs    Lab 01/14/18 0440 01/15/18 0514 01/18/18 1045 01/19/18 0939 01/20/18 0246  NA 132* 137 135 137 135  K 4.2 4.5 4.5 4.4 4.9  CL 100 103 99 100 99  CO2 22 26 22 22 24   GLUCOSE 307* 205* 230* 277* 323*  BUN 33* 36* 28* 43* 54*  CREATININE 1.43* 1.47* 1.53* 1.71* 1.71*  CALCIUM 8.2* 8.3* 8.6* 8.3* 8.1*  MG 2.2 2.4 1.9  --   --   PHOS 3.2 3.0  --   --   --    GFR: Estimated Creatinine Clearance: 40.3 mL/min (A) (by C-G formula based on SCr of 1.71 mg/dL (H)). Recent Labs  Lab 01/14/18 0440 01/15/18 0514 01/18/18 1045 01/18/18 1507  WBC 17.6* 14.0* 21.7*  --   LATICACIDVEN  --   --   --  1.10    Liver Function Tests: Recent Labs  Lab 01/14/18 0440 01/15/18 0514 01/18/18 1045  AST 25 21 28   ALT 17 17 19   ALKPHOS 79 79 86  BILITOT 0.4 0.5 0.8  PROT 6.4* 6.0* 6.5  ALBUMIN 2.9* 2.8* 2.7*   No results for input(s): LIPASE, AMYLASE in the last 168 hours. No results for input(s): AMMONIA in the last 168 hours.  ABG  Component Value Date/Time   PHART 7.428 01/18/2018 1056   PCO2ART 32.1 01/18/2018 1056   PO2ART 118.0 (H) 01/18/2018 1056   HCO3 21.2 01/18/2018 1056   TCO2 22 01/18/2018 1056   ACIDBASEDEF 2.0 01/18/2018 1056   O2SAT 99.0 01/18/2018 1056     Coagulation Profile: Recent Labs  Lab 01/15/18 0514 01/17/18 1728 01/18/18 1045 01/19/18 0330 01/20/18 0246  INR 3.85 2.4* 2.66 3.48 4.26*    Cardiac Enzymes: No results for input(s): CKTOTAL, CKMB, CKMBINDEX, TROPONINI in the last 168 hours.  HbA1C: Hgb A1c MFr Bld  Date/Time Value Ref Range Status  10/14/2017 07:52 AM 6.6 (H) 4.6 - 6.5 % Final    Comment:    Glycemic Control Guidelines for People with Diabetes:Non Diabetic:  <6%Goal of Therapy: <7%Additional Action Suggested:  >8%   04/16/2017 10:02 AM 7.9 (H) 4.8 - 5.6 % Final    Comment:    (NOTE)         Prediabetes: 5.7 - 6.4         Diabetes: >6.4         Glycemic control for adults with diabetes: <7.0     CBG: Recent Labs  Lab  01/19/18 2044 01/20/18 0031 01/20/18 0815 01/20/18 1144 01/20/18 1701  GLUCAP 362* 315* 336* 351* 298*   Baltazar Apo, MD, PhD 01/20/2018, 5:42 PM [facility] [phone] or if no answer [phone]

## 2018-01-21 ENCOUNTER — Inpatient Hospital Stay: Payer: Medicare Other | Admitting: Primary Care

## 2018-01-21 DIAGNOSIS — W19XXXA Unspecified fall, initial encounter: Secondary | ICD-10-CM

## 2018-01-21 LAB — GLUCOSE, CAPILLARY
Glucose-Capillary: 239 mg/dL — ABNORMAL HIGH (ref 70–99)
Glucose-Capillary: 222 mg/dL — ABNORMAL HIGH (ref 70–99)
Glucose-Capillary: 269 mg/dL — ABNORMAL HIGH (ref 70–99)
Glucose-Capillary: 292 mg/dL — ABNORMAL HIGH (ref 70–99)

## 2018-01-21 LAB — BASIC METABOLIC PANEL
Anion gap: 12 (ref 5–15)
BUN: 51 mg/dL — AB (ref 8–23)
CO2: 26 mmol/L (ref 22–32)
Calcium: 8.1 mg/dL — ABNORMAL LOW (ref 8.9–10.3)
Chloride: 98 mmol/L (ref 98–111)
Creatinine, Ser: 1.52 mg/dL — ABNORMAL HIGH (ref 0.61–1.24)
GFR calc Af Amer: 51 mL/min — ABNORMAL LOW (ref >60)
GFR calc non Af Amer: 44 mL/min — ABNORMAL LOW (ref >60)
GLUCOSE: 235 mg/dL — AB (ref 70–99)
Potassium: 4.6 mmol/L (ref 3.5–5.1)
Sodium: 136 mmol/L (ref 135–145)

## 2018-01-21 LAB — PROTIME-INR
INR: 2.93
Prothrombin Time: 30.2 seconds — ABNORMAL HIGH (ref 11.4–15.2)

## 2018-01-21 LAB — CULTURE, BLOOD (ROUTINE X 2): Special Requests: ADEQUATE

## 2018-01-21 MED ORDER — SENNOSIDES-DOCUSATE SODIUM 8.6-50 MG PO TABS
1.0000 | ORAL_TABLET | Freq: Two times a day (BID) | ORAL | Status: DC
  Administered 2018-01-21 – 2018-01-25 (×4): 1 via ORAL
  Filled 2018-01-21 (×13): qty 1

## 2018-01-21 MED ORDER — METHYLPREDNISOLONE SODIUM SUCC 125 MG IJ SOLR
60.0000 mg | Freq: Two times a day (BID) | INTRAMUSCULAR | Status: DC
  Administered 2018-01-21 – 2018-01-26 (×11): 60 mg via INTRAVENOUS
  Filled 2018-01-21 (×11): qty 2

## 2018-01-21 MED ORDER — HYDROCODONE-ACETAMINOPHEN 5-325 MG PO TABS
1.0000 | ORAL_TABLET | Freq: Four times a day (QID) | ORAL | Status: DC | PRN
  Administered 2018-01-21 – 2018-01-28 (×18): 1 via ORAL
  Filled 2018-01-21 (×18): qty 1

## 2018-01-21 NOTE — Care Management Note (Addendum)
Case Management Note  Patient Details  Name: [name] MRN: [id number] Date of Birth: 1940-03-20  Subjective/Objective:   MD asked this am if patient would be candidate for ltach NCM will wait for Palliative to meet with family and see what they recommend- NCM received call from Palliative stating patient family is interested in patient going to [place].  NCM made referral to [facility] to see if patient would be LTACH candidate.  Awaiting to hear back.  NCM received call back from [facility] and patient does not meet LTACH criteria.                 Action/Plan: NCM will follow for transition of care needs.  Expected Discharge Date:                  Expected Discharge Plan:  Long Term Acute Care (LTAC)  In-House Referral:     Discharge planning Services  CM Consult  Post Acute Care Choice:    Choice offered to:     DME Arranged:    DME Agency:     HH Arranged:    HH Agency:     Status of Service:  In process, will continue to follow  If discussed at Long Length of Stay Meetings, dates discussed:    Additional Comments:  Zenon Mayo, RN 01/21/2018, 3:35 PM

## 2018-01-21 NOTE — Care Management Important Message (Signed)
Important Message  Patient Details  Name: [name] MRN: [id number] Date of Birth: [birth year]   Medicare Important Message Given:  Yes    Orbie Pyo 01/21/2018, 3:34 PM

## 2018-01-21 NOTE — Care Management (Signed)
3.  S/W  ABIA  @  [employer] # [phone]   1. LOVENOX   80 MG  BID  ( syringes ) COVER- NOT COVER / [facility]- YES  # [phone]  2. ENOXAPARIN   80 MG BID  ( syringes ) COVER- YES CO-PAY-  $ 11.00 TIER- 2 DRUG PRIOR APPROVAL- NO  3. LOVENOX  120 MG DAILY  ( syringes ) COVER- NOT COVER / NONE FORMULARY PRIOR APPROVAL- YES # [phone]  4   ENOXAPARIN  120 MG   DAILY  ( syringes ) COVER- YES CO-PAY- $ 11.00 TIER- 2 DRUG PRIOR APPROVAL- NO  PREFERRED PHARMACY : YES  -  [facility]

## 2018-01-21 NOTE — Progress Notes (Signed)
PROGRESS NOTE    [name]  RCB:[id number] DOB: [birth year] DOA: 01/18/2018 PCP: Jonathan Sanders, MD    Brief Narrative:  77 year old male who presented with a fall and chest pain.  He does have significant past medical history for hypertension, history of PE, peripheral vascular disease, type 2 diabetes mellitus, atrial fibrillation, status post gastric bypass, chronic hypoxic respiratory failure due to interstitial lung disease and history of prostate cancer.  Patient was recently seen in the pulmonary clinic after hospitalization at [facility], he was placed on steroids and told to increase his oxygen flow, patient has been feeling very weak over the last few days, on the day of admission he had a mechanical fall and he was brought to the hospital for further evaluation.  Physical examination he was in respiratory distress, he was placed on a BiPAP, blood pressure 93/77, heart rate 133, respirate 28, oxygen saturation 97% (BiPAP), he was awake and alert, heart S1-S2 present, tachycardic, lungs with decreased lung movement, positive accessory muscle use, abdomen soft nontender, no lower extremity edema.   Arterial blood gas pH 7.45, PCO2 32, PO2 118, bicarb 22, oxygen saturation 99%.  Sodium 137 potassium 4.4, chloride 100, bicarb 22, glucose 277, BUN 43 creatinine 1.71.  INR 3.48.  White count 1.7, hemoglobin 12.2, hematocrit 31, platelets 200.  Chest x-ray with fibrotic changes, predominantly at bases. EKG with atrial fibrillation 137 bpm.   Patient was admitted to the hospital working diagnosis acute on chronic hypoxic respiratory failure related to idiopathic pulmonary fibrosis.   01/21/2018: Patient was seen alongside patient's wife.  Also discussed with the pulmonologist, Dr. Lamonte Mercado.  Patient seems to be improving.  Patient off supplemental oxygen.  Patient continues to report feeling weak.  Apart from weakness, no other constitutional symptoms reported.  The plan is for patient to meet  with palliative care team.  Patient's wife seems hopeful that patient will qualify for LTAC.  Further management will depend on hospital course.    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 Acute on chronic hypoxemic respiratory failure IPF exacerbation. On high dose systemic steroids, and aggressive bronchodilators. Patient has tolerated well high flow nasal cannula, will continue to use bipap only as needed. Patient likely will need LTAC at discharge.  01/21/2018: Symptoms have improved significantly.  Patient is not deemed a good candidate for LTAC.  Hopefully, disposition will be pursued early next week.  Plan is for patient to follow-up with pulmonary fibrosis clinic.  Likely, patient will be discharged back home on tapering dose of steroids.  2. Pulmonary embolism. Continue anticoagulation with wafarin, patient not able to afford novel anticoagulants, inr continue to be supratherapeutic today, continue to follow pharmacy protocol.  3. T2DM with hyperglycemia. Steroids induced worsening hyperglycemia, fasting glucose 323, capillary glucose 362, 315, 336, 351. Will continue insulin coverage with insulin sliding scale and will add long acting insulin with levimir 10 units, patient continue to have very poor oral intake, will hold for pre-meal insulin coverage for now. 01/21/2018: Continue  to optimize.  Hopefully, blood sugar control will improve as dose of steroids is being decreased.  4. AKI on CKD stage 3.  Renal function has been stable with serum cr at 1,71 with K at 4,9 and serum bicarbonate at 24. Continue to hold diuretic therapy for now. Encourage po intake, follow with renal panel in am, avoid hypotension and nephrotoxic medications.   Chronic kidney disease is at baseline.  5. Prostate Cancer. Outpatient follow up,.   6. New onset atrial fibrillation complicated with acute diastolic heart failure. Patient continue to be in atrial fibrillation rhythm, improved rate control with metoprolol, will continue anticoagulation with warfarin. INR this am 4,26.  01/21/18:  INR is down to 2.93.  DVT prophylaxis: heparin   Code Status: dnr Family Communication: I spoke with patient's wife at the bedside and all questions were addressed.  Disposition Plan/ discharge barriers: pending clinical improvement.   Body mass index is 24.01 kg/m. Malnutrition Type:  Nutrition Problem: Severe Malnutrition Etiology: chronic illness(pulmonary fibrosis)   Malnutrition Characteristics:  Signs/Symptoms: severe fat depletion, severe muscle depletion   Nutrition Interventions:  Interventions: Ensure Enlive (each supplement provides 350kcal and 20 grams of protein)  RN Pressure Injury Documentation:     Consultants:   Pulmonary   Procedures:     Antimicrobials:       Subjective: Seen alongside patient's wife. Patient has been off supplemental oxygen for 20 Mins and O2 sat remains around 97%. No SOB reported. No fever or chills. No chest pain. Coughing has improved significantly.  Objective: Vitals:   01/21/18 0900 01/21/18 1000 01/21/18 1100 01/21/18 1152  BP: 116/85 112/77 120/75 (!) 122/91  Pulse: 86 73 74 70  Resp: 17 (!) 27 14 (!) 24  Temp:    (!) 97.3 F (36.3 C)  TempSrc:    Oral  SpO2: 99% 97% 99% 99%  Weight:      Height:       No intake or output data in the 24 hours ending 01/21/18 1250 Filed Weights   01/19/18 1015  Weight: 80.3 kg    Examination:    General: Not in any distress. AAO X 3 Neurology: Awake and alert, Moves all limbs.  HEENT: Pallor. No jaundice. Cardiovascular: No JVD. S1-S2. Pulmonary: decreased breath sounds Globally. Gastrointestinal. Abdomen with no organomegaly, non tender, no rebound or guarding   Data Reviewed: I have personally reviewed following labs and imaging studies  CBC: Recent Labs  Lab 01/15/18 0514 01/18/18 1045  WBC 14.0* 21.7*  NEUTROABS 11.0* 18.4*  HGB 10.7* 12.2*  HCT 34.0* 39.8  MCV 95.2 92.6  PLT 293 161   Basic Metabolic Panel: Recent Labs  Lab 01/15/18 0514 01/18/18 1045 01/19/18 0939 01/20/18 0246 01/21/18 0229  NA 137 135 137 135 136  K 4.5 4.5 4.4 4.9 4.6  CL 103 99 100 99 98  CO2 26 22 22 24 26   GLUCOSE 205* 230* 277* 323* 235*  BUN 36* 28* 43* 54* 51*  CREATININE 1.47* 1.53* 1.71* 1.71* 1.52*  CALCIUM 8.3* 8.6* 8.3* 8.1* 8.1*  MG 2.4 1.9  --   --   --   PHOS 3.0  --   --   --   --    GFR: Estimated Creatinine Clearance: 45.4 mL/min (A) (by C-G formula based on SCr of 1.52 mg/dL (H)). Liver Function Tests: Recent Labs  Lab 01/15/18 0514 01/18/18 1045  AST 21 28  ALT 17 19  ALKPHOS 79 86  BILITOT 0.5 0.8  PROT  6.0* 6.5  ALBUMIN 2.8* 2.7*   No results for input(s): LIPASE, AMYLASE in the last 168 hours. No results for input(s): AMMONIA in the last 168 hours. Coagulation Profile: Recent Labs  Lab 01/17/18 1728 01/18/18 1045 01/19/18 0330 01/20/18 0246 01/21/18 0229  INR 2.4* 2.66 3.48 4.26* 2.93   Cardiac Enzymes: No results for input(s): CKTOTAL, CKMB, CKMBINDEX, TROPONINI in the last 168 hours. BNP (last 3 results) Recent Labs    01/10/18 1657  PROBNP 123.0*   HbA1C: No results for input(s): HGBA1C in the last 72 hours. CBG: Recent Labs  Lab 01/20/18 1144 01/20/18 1701 01/20/18 2145 01/21/18 0756 01/21/18 1213  GLUCAP 351* 298* 273* 239* 222*   Lipid Profile: No results for input(s): CHOL, HDL, LDLCALC, TRIG, CHOLHDL, LDLDIRECT  in the last 72 hours. Thyroid Function Tests: No results for input(s): TSH, T4TOTAL, FREET4, T3FREE, THYROIDAB in the last 72 hours. Anemia Panel: No results for input(s): VITAMINB12, FOLATE, FERRITIN, TIBC, IRON, RETICCTPCT in the last 72 hours.    Radiology Studies: I have reviewed all of the imaging during this hospital visit personally     Scheduled Meds: . atorvastatin  80 mg Oral QHS  . feeding supplement (ENSURE ENLIVE)  237 mL Oral BID BM  . feeding supplement (GLUCERNA SHAKE)  237 mL Oral TID BM  . FLUoxetine  60 mg Oral QHS  . insulin aspart  0-15 Units Subcutaneous TID WC  . insulin aspart  0-5 Units Subcutaneous QHS  . insulin detemir  10 Units Subcutaneous Daily  . ipratropium-albuterol  3 mL Nebulization TID  . methylPREDNISolone (SOLU-MEDROL) injection  125 mg Intravenous Q6H  . metoprolol tartrate  25 mg Oral BID  . sodium chloride flush  3 mL Intravenous Q12H   Continuous Infusions:    LOS: 3 days   [place] Public, MD [facility] Pager [phone]

## 2018-01-21 NOTE — Progress Notes (Signed)
ANTICOAGULATION CONSULT NOTE - Initial Consult  Pharmacy Consult for Heparin when INR <2 Indication: atrial fibrillation and pulmonary embolus (dx 12/4)  Allergies  Allergen Reactions  . Sulfacetamide Sodium Swelling    throat swelling  . Latex Itching    Severe contact dermatitis  . Adhesive [Tape] Other (See Comments)    Tears skin-- "No tape of any kind, they all tear skin right off"    Patient Measurements: Height: 6' (182.9 cm) Weight: 177 lb 0.5 oz (80.3 kg) IBW/kg (Calculated) : 77.6 Heparin Dosing Weight: 80 kg  Vital Signs: Temp: 97.9 F (36.6 C) (12/13 0400) Temp Source: Oral (12/13 0400) BP: 133/90 (12/13 0400) Pulse Rate: 74 (12/12 2320)  Labs: Recent Labs    01/18/18 1045 01/19/18 0330 01/19/18 0939 01/20/18 0246 01/21/18 0229  HGB 12.2*  --   --   --   --   HCT 39.8  --   --   --   --   PLT 200  --   --   --   --   LABPROT 28.0* 34.4*  --  40.3* 30.2*  INR 2.66 3.48  --  4.26* 2.93  CREATININE 1.53*  --  1.71* 1.71* 1.52*    Estimated Creatinine Clearance: 45.4 mL/min (A) (by C-G formula based on SCr of 1.52 mg/dL (H)).   Medical History: Past Medical History:  Diagnosis Date  . Arthritis    hands   . Asthma   . Chronic atrial fibrillation    a.fib on warfarin- Dr. Percival Spanish follows  . Diverticulosis of colon (without mention of hemorrhage)   . ED (erectile dysfunction)   . Fibromyalgia   . GERD (gastroesophageal reflux disease)   . Hard of hearing    wears hearing aids  . Headache   . History of blood clots    behind right knee and then went into left lung 20yrs ago  . History of colon polyps   . History of kidney stones   . Hx of diabetes mellitus    "no longer diabetic since gastric bypass" - no longer taking metformin"  in 2 months "problems with low blood sugar now"  . Hx of gout    but doesn't take any meds  . Inflammation of colonic mucosa    recent admission and release from [facility] 02-28-15-remains on oral antibiotic  . IPF  (idiopathic pulmonary fibrosis) ([place])   . Obesity   . Peripheral vascular disease ([place])   . Pneumonia    last time about 14yrs ago  . Prostate cancer ([place])   . Pulmonary embolism ([place])    over 10 yrs ago "many years ago"  . Restless legs syndrome (RLS)   . Skin cancer   . Unspecified asthma(493.90)    as a child  . Unspecified essential hypertension    hx of no longer on medication due to gastric bypass     Medications:  Medications Prior to Admission  Medication Sig Dispense Refill Last Dose  . acarbose (PRECOSE) 50 MG tablet Take 50 mg by mouth 3 (three) times daily with meals.   01/17/2018 at Unknown time  . atorvastatin (LIPITOR) 80 MG tablet Take 1 tablet (80 mg total) by mouth at bedtime. 90 tablet 3 01/17/2018 at Unknown time  . benzonatate (TESSALON) 200 MG capsule Take 1 capsule (200 mg total) by mouth 3 (three) times daily as needed for cough. 20 capsule 0 01/17/2018 at Unknown time  . chlorpheniramine-HYDROcodone (TUSSIONEX) 10-8 MG/5ML SUER Take 5 mLs by mouth  every 12 (twelve) hours as needed for cough. (Patient taking differently: Take 1.5 mLs by mouth every 12 (twelve) hours as needed for cough. ) 140 mL 0 Past Week at Unknown time  . FLUoxetine (PROZAC) 20 MG capsule TAKE 3 CAPSULES BY MOUTH EVERYDAY AT BEDTIME (Patient taking differently: Take 60 mg by mouth at bedtime. ) 270 capsule 1 01/17/2018 at Unknown time  . furosemide (LASIX) 20 MG tablet Take 1 tablet (20 mg total) by mouth every other day. (Patient taking differently: Take 20 mg by mouth daily. ) 180 tablet 1 01/17/2018 at Unknown time  . gabapentin (NEURONTIN) 300 MG capsule TAKE 1 CAPSULE BY MOUTH TWICE A DAY (Patient taking differently: Take 300 mg by mouth 2 (two) times daily. ) 180 capsule 1 01/17/2018 at Unknown time  . hydrocortisone 2.5 % cream Apply 1 application topically 2 (two) times daily as needed (itching).   2 Past Week at Unknown time  . hydroxypropyl methylcellulose / hypromellose (ISOPTO TEARS /  GONIOVISC) 2.5 % ophthalmic solution Place 1 drop into both eyes 2 (two) times daily as needed for dry eyes.   Past Week at Unknown time  . Omega-3 Fatty Acids (FISH OIL) 1000 MG CAPS Take 1,000 mg by mouth daily.   01/17/2018 at Unknown time  . omeprazole (PRILOSEC) 40 MG capsule TAKE 1 CAPSULE (40 MG TOTAL) BY MOUTH DAILY. (Patient taking differently: Take 40 mg by mouth at bedtime. ) 90 capsule 1 01/17/2018 at Unknown time  . predniSONE (DELTASONE) 10 MG tablet 4 tabs x 4 days, 3 tabs x 4 days; 2 tabs x 4 days; 1 tab x 4 days 40 tablet 0 01/17/2018 at Unknown time  . PRESCRIPTION MEDICATION Apply 1 g topically 4 (four) times daily as needed (pain). Baclofen/Gabapentin/Lidocaine Compund Cream   01/17/2018 at Unknown time  . primidone (MYSOLINE) 50 MG tablet 150 mg in the morning, 100 mg at night (Patient taking differently: Take 100-150 mg by mouth See admin instructions. 150 mg in the morning, 100 mg at night) 450 tablet 1 01/17/2018 at Unknown time  . ranitidine (ZANTAC) 300 MG tablet Take 300 mg by mouth at bedtime.   3 01/17/2018 at Unknown time  . sucralfate (CARAFATE) 1 g tablet Take 1 g by mouth 3 (three) times daily before meals.   6 01/18/2018 at Unknown time  . tamsulosin (FLOMAX) 0.4 MG CAPS capsule Take 0.4 mg by mouth at bedtime.    01/17/2018 at Unknown time  . tiZANidine (ZANAFLEX) 4 MG tablet Take 4 mg by mouth every 6 (six) hours as needed for muscle spasms.    Past Month at Unknown time  . vitamin E 400 UNIT capsule Take 400 Units by mouth daily.   01/17/2018 at Unknown time  . warfarin (COUMADIN) 2.5 MG tablet Take 2.5 mg by mouth daily. Take 2.5mg  SUN and THUR and 3.75mg  on all other days.   01/17/2018 at 2000  . zolpidem (AMBIEN CR) 12.5 MG CR tablet Take 12.5 mg by mouth at bedtime.   01/17/2018 at Unknown time  . ipratropium-albuterol (DUONEB) 0.5-2.5 (3) MG/3ML SOLN Take 3 mLs by nebulization every 6 (six) hours as needed. (Patient taking differently: Take 3 mLs by nebulization every 6  (six) hours as needed (shortness of breath). ) 360 mL 0     Assessment: 77 yo M with recent admission 12/4 > 12/8 for mgmt of pulm disease.  Noted at that time to have new PE on CT scan.  Pt was on Coumadin with  therapeutic INR at that time but declined LMWH and was unable to afford Eliqus/Xarelto. INR therapeutic today.  Pharmacy has been asked to initiate heparin when INR <2 for recent PE and hx afib.  INR 2.93 today  Goal of Therapy:  Heparin level 0.3-0.7 units/ml Monitor platelets by anticoagulation protocol: Yes   Plan:  No heparin today. Follow-up with INR in AM.  Tirso Laws A. Levada Dy, PharmD, [place] Pager: [phone] Please utilize Amion for appropriate phone number to reach the unit pharmacist ([place])    01/21/2018 7:26 AM

## 2018-01-21 NOTE — Consult Note (Signed)
   [facility] CM Inpatient Consult   01/21/2018  DONNELLE OLMEDA 1940/12/11 [id number]    Patient screened for potential [facility] Care Management services due to unplanned readmission risk score of 22% (high) and hospital readmission.  Spoke with inpatient RNCM. Palliative consult is pending. Will continue to follow and engage for potential [facility] Care Management services if appropriate.   Marthenia Rolling, MSN-Ed, RN,BSN [facility] Liaison [phone]

## 2018-01-21 NOTE — Progress Notes (Signed)
Palliative:    [name] is resting quietly in bed.  He greets me making and keeping eye contact.  His color looks better today and his wife tells me that he is not requiring oxygen at this point with saturations 96%.  We talked about his visit with the pulmonologist, the plan for continued steroids, follow-up CT in the future.  Family states they are pleased that in hospital pulmonology will help coordinate ILD clinic.  We review treatment plans and some labs.  We talked about PT evaluation that is scheduled, and patient/[facility].  This had been suggested earlier by hospitalist per family.  [name] after bacte[facility].  Patient and family states that they would not accept rehab in a nursing home.  If  [facility], they would want in-home home health/PT services.  Conference with case management related to patient disposition needs/questions.  50 minutes, extended time [name], NP Palliative Medicine Team [phone] Greater than 50% of this time was spent counseling and coordinating care related to the above assessment and plan.

## 2018-01-22 ENCOUNTER — Inpatient Hospital Stay (HOSPITAL_COMMUNITY): Payer: Medicare Other

## 2018-01-22 LAB — PROTIME-INR
INR: 2.38
Prothrombin Time: 25.7 seconds — ABNORMAL HIGH (ref 11.4–15.2)

## 2018-01-22 LAB — GLUCOSE, CAPILLARY
GLUCOSE-CAPILLARY: 262 mg/dL — AB (ref 70–99)
Glucose-Capillary: 307 mg/dL — ABNORMAL HIGH (ref 70–99)
Glucose-Capillary: 128 mg/dL — ABNORMAL HIGH (ref 70–99)
Glucose-Capillary: 115 mg/dL — ABNORMAL HIGH (ref 70–99)

## 2018-01-22 MED ORDER — NYSTATIN 100000 UNIT/ML MT SUSP
5.0000 mL | Freq: Four times a day (QID) | OROMUCOSAL | Status: DC
  Administered 2018-01-22 – 2018-01-25 (×13): 500000 [IU] via ORAL
  Filled 2018-01-22 (×13): qty 5

## 2018-01-22 MED ORDER — MORPHINE SULFATE (PF) 2 MG/ML IV SOLN
INTRAVENOUS | Status: AC
  Administered 2018-01-22: 2 mg via INTRAVENOUS
  Filled 2018-01-22: qty 1

## 2018-01-22 MED ORDER — FLUCONAZOLE 100 MG PO TABS
100.0000 mg | ORAL_TABLET | Freq: Every day | ORAL | Status: DC
  Administered 2018-01-22 – 2018-01-27 (×6): 100 mg via ORAL
  Filled 2018-01-22 (×7): qty 1

## 2018-01-22 MED ORDER — MORPHINE SULFATE (PF) 2 MG/ML IV SOLN
2.0000 mg | Freq: Once | INTRAVENOUS | Status: AC
  Administered 2018-01-22: 2 mg via INTRAVENOUS

## 2018-01-22 MED ORDER — LIDOCAINE 5 % EX PTCH
1.0000 | MEDICATED_PATCH | CUTANEOUS | Status: AC
  Administered 2018-01-22: 1 via TRANSDERMAL
  Filled 2018-01-22: qty 1

## 2018-01-22 NOTE — Progress Notes (Signed)
PROGRESS NOTE    [name]  XJO:[id number] DOB: 1940/10/05 DOA: 01/18/2018 PCP: Jinny Sanders, MD    Brief Narrative:  77 year old male who presented with a fall and chest pain.  He does have significant past medical history for hypertension, history of PE, peripheral vascular disease, type 2 diabetes mellitus, atrial fibrillation, status post gastric bypass, chronic hypoxic respiratory failure due to interstitial lung disease and history of prostate cancer.  Patient was recently seen in the pulmonary clinic after hospitalization at [facility], he was placed on steroids and told to increase his oxygen flow, patient has been feeling very weak over the last few days, on the day of admission he had a mechanical fall and he was brought to the hospital for further evaluation.  Physical examination he was in respiratory distress, he was placed on a BiPAP, blood pressure 93/77, heart rate 133, respirate 28, oxygen saturation 97% (BiPAP), he was awake and alert, heart S1-S2 present, tachycardic, lungs with decreased lung movement, positive accessory muscle use, abdomen soft nontender, no lower extremity edema.   Arterial blood gas pH 7.45, PCO2 32, PO2 118, bicarb 22, oxygen saturation 99%.  Sodium 137 potassium 4.4, chloride 100, bicarb 22, glucose 277, BUN 43 creatinine 1.71.  INR 3.48.  White count 1.7, hemoglobin 12.2, hematocrit 31, platelets 200.  Chest x-ray with fibrotic changes, predominantly at bases. EKG with atrial fibrillation 137 bpm.   Patient was admitted to the hospital working diagnosis acute on chronic hypoxic respiratory failure related to idiopathic pulmonary fibrosis.   01/21/2018: Patient was seen alongside patient's wife.  Also discussed with the pulmonologist, Dr. Lamonte Sakai.  Patient seems to be improving.  Patient off supplemental oxygen.  Patient continues to report feeling weak.  Apart from weakness, no other constitutional symptoms reported.  The plan is for patient to meet  with palliative care team.  Patient's wife seems hopeful that patient will qualify for LTAC.  Further management will depend on hospital course.  01/22/2018: Patient seen alongside patient's wife and brother.  Patient reports pain and swelling.  Palpatory reported.  Suspect patient may have oropharyngeal candidiasis.  Will start patient on Diflucan and nystatin empirically.  Will consult rehab team.  Delay, patient will be discharged early part of next week.  No other new complaints.  Assessment & Plan:   Principal Problem:   Acute on chronic respiratory failure with hypoxia (HCC) Active Problems:   Atrial fibrillation (HCC)   Type 2 diabetes mellitus with neurologic complication (HCC)   Anticoagulant long-term use   Malignant neoplasm of prostate (HCC)   Pulmonary fibrosis (HCC)   CKD (chronic kidney disease) stage 3, GFR 30-59 ml/min (HCC)   Protein-calorie malnutrition, severe   Goals of care, counseling/discussion   Palliative care by specialist   ILD (interstitial lung disease) ([place])   Fall   1. Acute on chronic hypoxemic respiratory failure IPF exacerbation. On high dose systemic steroids, and aggressive bronchodilators. Patient has tolerated well high flow nasal cannula, will continue to use bipap only as needed. Patient likely will need LTAC at discharge.  01/22/2018: Symptoms continues to improve.  Patient is not deemed a good candidate for LTAC.  Hopefully, disposition will be pursued early next week.  For rehab.  Plan is for patient to follow-up with pulmonary fibrosis clinic.  Likely, patient will be discharged on tapering dose of steroids.  2. Pulmonary embolism. Continue anticoagulation with wafarin, patient not able to afford novel anticoagulants, inr continue to be supratherapeutic today, continue to  follow pharmacy protocol.  3. T2DM with hyperglycemia. Steroids induced worsening hyperglycemia, fasting glucose 323, capillary glucose 362, 315, 336, 351. Will continue  insulin coverage with insulin sliding scale and will add long acting insulin with levimir 10 units, patient continue to have very poor oral intake, will hold for pre-meal insulin coverage for now. 01/21/2018: Continue to optimize.  Hopefully, blood sugar control will improve as dose of steroids is being decreased.  4. AKI on CKD stage 3. Renal function has been stable with serum cr at 1,71 with K at 4,9 and serum bicarbonate at 24. Continue to hold diuretic therapy for now. Encourage po intake, follow with renal panel in am, avoid hypotension and nephrotoxic medications.   Chronic kidney disease is at baseline.  5. Prostate Cancer. Outpatient follow up,.   6. New onset atrial fibrillation complicated with acute diastolic heart failure. Patient continue to be in atrial fibrillation rhythm, improved rate control with metoprolol, will continue anticoagulation with warfarin. INR this am 4,26.  01/22/18:  INR is down to 2.38.  DVT prophylaxis: heparin   Code Status: dnr Family Communication: I spoke with patient's wife at the bedside and all questions were addressed.  Disposition Plan/ discharge barriers: pending clinical improvement.   Body mass index is 24.01 kg/m. Malnutrition Type:  Nutrition Problem: Severe Malnutrition Etiology: chronic illness(pulmonary fibrosis)   Malnutrition Characteristics:  Signs/Symptoms: severe fat depletion, severe muscle depletion   Nutrition Interventions:  Interventions: Ensure Enlive (each supplement provides 350kcal and 20 grams of protein)  RN Pressure Injury Documentation:     Consultants:   Pulmonary   Rehab consult requested  Procedures:     Antimicrobials:       Subjective: Seen alongside patient's wife and brother.  Shortness of breath has improved significantly.  Reports pain on eating.  Poor appetite.    Objective: Vitals:   01/22/18 0841 01/22/18 1139 01/22/18 1354 01/22/18 1620  BP:  (!) 203/75  109/89  Pulse:  77 76 73 80  Resp: (!) 22 16 16 18   Temp:  97.7 F (36.5 C)  97.7 F (36.5 C)  TempSrc:  Axillary  Axillary  SpO2: 94% 97% 98% 97%  Weight:      Height:        Intake/Output Summary (Last 24 hours) at 01/22/2018 1725 Last data filed at 01/22/2018 0600 Gross per 24 hour  Intake -  Output 800 ml  Net -800 ml   Filed Weights   01/19/18 1015  Weight: 80.3 kg    Examination:   General: Not in any distress. AAO X 3 Neurology: Awake and alert, Moves all limbs.  HEENT: Pallor. No jaundice.  Likely oral thrush Cardiovascular: No JVD. S1-S2. Pulmonary: decreased breath sounds Globally. Gastrointestinal. Abdomen with no organomegaly, non tender, no rebound or guarding   Data Reviewed: I have personally reviewed following labs and imaging studies  CBC: Recent Labs  Lab 01/18/18 1045  WBC 21.7*  NEUTROABS 18.4*  HGB 12.2*  HCT 39.8  MCV 92.6  PLT 790   Basic Metabolic Panel: Recent Labs  Lab 01/18/18 1045 01/19/18 0939 01/20/18 0246 01/21/18 0229  NA 135 137 135 136  K 4.5 4.4 4.9 4.6  CL 99 100 99 98  CO2 22 22 24 26   GLUCOSE 230* 277* 323* 235*  BUN 28* 43* 54* 51*  CREATININE 1.53* 1.71* 1.71* 1.52*  CALCIUM 8.6* 8.3* 8.1* 8.1*  MG 1.9  --   --   --    GFR: Estimated Creatinine Clearance:  45.4 mL/min (A) (by C-G formula based on SCr of 1.52 mg/dL (H)). Liver Function Tests: Recent Labs  Lab 01/18/18 1045  AST 28  ALT 19  ALKPHOS 86  BILITOT 0.8  PROT 6.5  ALBUMIN 2.7*   No results for input(s): LIPASE, AMYLASE in the last 168 hours. No results for input(s): AMMONIA in the last 168 hours. Coagulation Profile: Recent Labs  Lab 01/18/18 1045 01/19/18 0330 01/20/18 0246 01/21/18 0229 01/22/18 0246  INR 2.66 3.48 4.26* 2.93 2.38   Cardiac Enzymes: No results for input(s): CKTOTAL, CKMB, CKMBINDEX, TROPONINI in the last 168 hours. BNP (last 3 results) Recent Labs    01/10/18 1657  PROBNP 123.0*   HbA1C: No results for input(s): HGBA1C  in the last 72 hours. CBG: Recent Labs  Lab 01/21/18 1630 01/21/18 2106 01/22/18 0828 01/22/18 1138 01/22/18 1619  GLUCAP 269* 292* 307* 262* 128*   Lipid Profile: No results for input(s): CHOL, HDL, LDLCALC, TRIG, CHOLHDL, LDLDIRECT in the last 72 hours. Thyroid Function Tests: No results for input(s): TSH, T4TOTAL, FREET4, T3FREE, THYROIDAB in the last 72 hours. Anemia Panel: No results for input(s): VITAMINB12, FOLATE, FERRITIN, TIBC, IRON, RETICCTPCT in the last 72 hours.    Radiology Studies: I have reviewed all of the imaging during this hospital visit personally     Scheduled Meds: . atorvastatin  80 mg Oral QHS  . feeding supplement (ENSURE ENLIVE)  237 mL Oral BID BM  . feeding supplement (GLUCERNA SHAKE)  237 mL Oral TID BM  . fluconazole  100 mg Oral Daily  . FLUoxetine  60 mg Oral QHS  . insulin aspart  0-15 Units Subcutaneous TID WC  . insulin aspart  0-5 Units Subcutaneous QHS  . insulin detemir  10 Units Subcutaneous Daily  . ipratropium-albuterol  3 mL Nebulization TID  . lidocaine  1 patch Transdermal Q24H  . methylPREDNISolone (SOLU-MEDROL) injection  60 mg Intravenous Q12H  . metoprolol tartrate  25 mg Oral BID  . nystatin  5 mL Oral QID  . senna-docusate  1 tablet Oral BID  . sodium chloride flush  3 mL Intravenous Q12H   Continuous Infusions:    LOS: 4 days   Bonnell Public, MD [facility] Pager [phone]

## 2018-01-22 NOTE — Evaluation (Signed)
Physical Therapy Evaluation Patient Details Name: [name] MRN: [id number] DOB: 10-Mar-1940 Today's Date: 01/22/2018   History of Present Illness  77 y.o. male admitted with respiratory failure requiring bipap, though to be due to idiopathic pulmonary fibrosis. PMH includes but not limited to: PE, peripheral vascular disease, HTN, asthma, prostate CA, pulmonary fibrosis, DM, GERD, chronic A Fib, Arthritis, B TKA, L TSA, back surgery.    Clinical Impression  Pt admitted with above diagnosis. Pt currently with functional limitations due to the deficits listed below (see PT Problem List). PTA living at home with wife, 4 stairs to enter with rail. 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 Today, pt with decreased activity tolerance and generalized weakness. Emotionally appeals to therapy that he wants to regain his independence and is willing to work as hard as he can. Standing and ambulating short distance, DOE 3/4 quickly and fatigued.  Pt will benefit from skilled PT to increase their independence and safety with mobility to allow discharge to the venue listed below.    Vitals: 127/74 79 HR 95% on 2L  SpO2     Follow Up Recommendations CIR    Equipment Recommendations  (TBD )    Recommendations for Other Services OT consult;Rehab consult     Precautions / Restrictions Precautions Precautions: Fall Precaution Comments: Monitor Sats Restrictions Weight Bearing Restrictions: No      Mobility  Bed Mobility Overal bed mobility: Modified Independent                Transfers Overall transfer level: Needs assistance Equipment used: None Transfers: Sit to/from Stand Sit to Stand: Min guard         General transfer comment: min guard for sit to stand x5 this visit. DOE 2/4 with standing, did not desat on 2L  Ambulation/Gait Ambulation/Gait assistance: Min assist;Min  guard Gait Distance (Feet): 10 Feet Assistive device: None Gait Pattern/deviations: Step-to pattern;Step-through pattern Gait velocity: decreased   General Gait Details: pt with instability and becoming DOE 3/4 with short distance walking in room, use of UE support on fruniture as he walked around bed. Will trial rollator next visit with chair follow  Stairs            Wheelchair Mobility    Modified Rankin (Stroke Patients Only)       Balance                                             Pertinent Vitals/Pain Pain Assessment: Faces Faces Pain Scale: Hurts even more Pain Location: R chest rib area Pain Descriptors / Indicators: Aching Pain Intervention(s): Limited activity within patient's tolerance;Monitored during session    Home Living Family/patient expects to be discharged to:: Private residence Living Arrangements: Spouse/significant other Available Help at Discharge: Family Type of Home: House Home Access: Stairs to enter Entrance Stairs-Rails: Left Entrance Stairs-Number of Steps: 2 Home Layout: One level Home Equipment: Shower seat - built in;Hand held shower head      Prior Function Level of Independence: Independent         Comments: works driving.      Hand Dominance   Dominant Hand: Right    Extremity/Trunk Assessment   Upper Extremity Assessment Upper Extremity Assessment: Defer to OT evaluation;Generalized weakness(RUE ROM limitations)  Lower Extremity Assessment Lower Extremity Assessment: Generalized weakness       Communication   Communication: No difficulties  Cognition Arousal/Alertness: Awake/alert Behavior During Therapy: WFL for tasks assessed/performed Overall Cognitive Status: Within Functional Limits for tasks assessed                                        General Comments General comments (skin integrity, edema, etc.): Discussed safety considerations for home    Exercises  General Exercises - Lower Extremity Ankle Circles/Pumps: 10 reps Long Arc Quad: 10 reps Hip ABduction/ADduction: 10 reps Straight Leg Raises: 10 reps   Assessment/Plan    PT Assessment Patient needs continued PT services  PT Problem List Decreased balance;Decreased activity tolerance       PT Treatment Interventions DME instruction;Gait training;Stair training;Functional mobility training;Therapeutic activities;Therapeutic exercise;Balance training    PT Goals (Current goals can be found in the Care Plan section)  Acute Rehab PT Goals Patient Stated Goal: get stronger PT Goal Formulation: With patient/family Time For Goal Achievement: 02/05/18 Potential to Achieve Goals: Good    Frequency Min 3X/week   Barriers to discharge        Co-evaluation               AM-PAC PT "6 Clicks" Mobility  Outcome Measure Help needed turning from your back to your side while in a flat bed without using bedrails?: None Help needed moving from lying on your back to sitting on the side of a flat bed without using bedrails?: None Help needed moving to and from a bed to a chair (including a wheelchair)?: A Little Help needed standing up from a chair using your arms (e.g., wheelchair or bedside chair)?: A Little Help needed to walk in hospital room?: A Lot Help needed climbing 3-5 steps with a railing? : A Lot 6 Click Score: 18    End of Session Equipment Utilized During Treatment: Gait belt;Oxygen Activity Tolerance: Patient limited by fatigue Patient left: in bed;with call bell/phone within reach;with nursing/sitter in room Nurse Communication: Mobility status;Other (comment) PT Visit Diagnosis: Difficulty in walking, not elsewhere classified (R26.2)    Time: 1287-8676 PT Time Calculation (min) (ACUTE ONLY): 47 min   Charges:   PT Evaluation $PT Eval Moderate Complexity: 1 Mod PT Treatments $Gait Training: 8-22 mins $Therapeutic Activity: 8-22 mins       Reinaldo Berber, PT,  DPT Acute Rehabilitation Services Pager: [phone] Office: [phone]    Reinaldo Berber 01/22/2018, 2:05 PM

## 2018-01-23 ENCOUNTER — Other Ambulatory Visit: Payer: Self-pay | Admitting: Family Medicine

## 2018-01-23 LAB — HEPARIN LEVEL (UNFRACTIONATED): Heparin Unfractionated: 0.43 IU/mL (ref 0.30–0.70)

## 2018-01-23 LAB — PROTIME-INR
INR: 1.56
Prothrombin Time: 18.5 seconds — ABNORMAL HIGH (ref 11.4–15.2)

## 2018-01-23 LAB — GLUCOSE, CAPILLARY
Glucose-Capillary: 215 mg/dL — ABNORMAL HIGH (ref 70–99)
Glucose-Capillary: 181 mg/dL — ABNORMAL HIGH (ref 70–99)

## 2018-01-23 MED ORDER — MORPHINE SULFATE (PF) 2 MG/ML IV SOLN
2.0000 mg | Freq: Once | INTRAVENOUS | Status: AC
  Administered 2018-01-23: 2 mg via INTRAVENOUS
  Filled 2018-01-23: qty 1

## 2018-01-23 MED ORDER — LIDOCAINE 5 % EX PTCH
1.0000 | MEDICATED_PATCH | CUTANEOUS | Status: DC
  Administered 2018-01-23 – 2018-01-29 (×7): 1 via TRANSDERMAL
  Filled 2018-01-23 (×7): qty 1

## 2018-01-23 MED ORDER — SIMETHICONE 80 MG PO CHEW
80.0000 mg | CHEWABLE_TABLET | Freq: Once | ORAL | Status: DC
  Filled 2018-01-23: qty 1

## 2018-01-23 MED ORDER — HEPARIN (PORCINE) 25000 UT/250ML-% IV SOLN
1400.0000 [IU]/h | INTRAVENOUS | Status: DC
  Administered 2018-01-23 – 2018-01-24 (×3): 1400 [IU]/h via INTRAVENOUS
  Filled 2018-01-23 (×2): qty 250

## 2018-01-23 NOTE — Progress Notes (Signed)
PROGRESS NOTE    [name]  [name]:[id number] DOB: 22-Dec-1940 DOA: 01/18/2018 PCP: Jonathan Sanders, MD    Brief Narrative:  Patient is a 77 year old male, with past medical history significant for hypertension, PE, peripheral vascular disease, type 2 diabetes mellitus, atrial fibrillation, status post gastric bypass, chronic hypoxic respiratory failure due to presumed interstitial lung disease and prostate cancer.  Patient was recently discharged from [facility], but patient may not have taken his steroids after discharge.  Patient was seen at the pulmonary clinic after recent discharge from [facility], placed on steroids and was advised to increase his  supplemental oxygen.  Patient presented with worsening weakness, mechanical fall, and respiratory distress.  Patient was admitted for further assessment and management.  On presentation, patient was initially put on BiPAP.  Pulmonary team was consulted to directed his care.  Patient was started on high dose of IV Solu-Medrol.  With above, patient's pulmonary symptoms improved significantly.  Patient has been off the BiPAP.  The plan is for patient to follow-up with interstitial lung disease/pulmonary fibrosis clinic on discharge.  Meanwhile, inpatient rehab team has been consulted to assess patient's suitability for rehabilitation.  Patient remains very weak.  Oral thrush with odynophagia, likely secondary to steroid use, was noted.  Patient is currently on Diflucan and nystatin.  Pulmonary team is still directing patient's care.  Steroids have been tapered by the pulmonary team.  The plan is to discharge patient to rehab facility if accepted, and when cleared for discharge by the pulmonary team.  Long-term prognosis is guarded.  Assessment & Plan:   Principal Problem:   Acute on chronic respiratory failure with hypoxia (HCC) Active Problems:   Atrial fibrillation (HCC)   Type 2 diabetes mellitus with neurologic complication  (HCC)   Anticoagulant long-term use   Malignant neoplasm of prostate (HCC)   Pulmonary fibrosis (HCC)   CKD (chronic kidney disease) stage 3, GFR 30-59 ml/min (HCC)   Protein-calorie malnutrition, severe   Goals of care, counseling/discussion   Palliative care by specialist   ILD (interstitial lung disease) ([place])   Fall   1. Acute on chronic hypoxemic respiratory failure/Possible IPF exacerbation: -Patient is on high dose systemic steroids -Continue neb treatment. -Continue supplemental oxygen. -Pulmonary input is highly appreciated. -Pursue rehab once cleared for discharge by the pulmonary team. -Follow with the pulmonary fibrosis/ILD clinic on discharge  2. Pulmonary embolism: Continue anticoagulation with wafarin (patient not able to afford novel anticoagulants)  3. T2DM with hyperglycemia: -Continue subcutaneous Levemir 10 units daily  -Continue sliding scale insulin coverage.   - Continue to optimize blood sugar control.  Hopefully, blood sugar control will improve as dose of steroids is being decreased.  4. AKI on CKD stage 3: Acute kidney injury has resolved.   CKD is down to baseline.   Continue to monitor electrolytes and renal function.    5. Prostate Cancer: Stable. Follow-up with the urologist and primary care provider on discharge.   6. New onset atrial fibrillation complicated with acute diastolic heart failure: -Rate controlled  -Continue anticoagulation  -Further management will depend on hospital course.    7.  Oral candidiasis with possible candidal esophagitis: Likely related to steroids Complete course of Diflucan and nystatin Monitor clinically.  DVT prophylaxis: heparin   Code Status:  DO NOT RESUSCITATE.   Family Communication:  Wife and daughter. Disposition Plan/ discharge barriers:  Possible discharge to rehab facility/CIR.     Body mass index is 24.01 kg/m. Malnutrition  Type:  Nutrition Problem: Severe Malnutrition Etiology:  chronic illness(pulmonary fibrosis)   Malnutrition Characteristics:  Signs/Symptoms: severe fat depletion, severe muscle depletion   Nutrition Interventions:  Interventions: Ensure Enlive (each supplement provides 350kcal and 20 grams of protein)  RN Pressure Injury Documentation:     Consultants:   Pulmonary   Rehab consult requested  Procedures:     Antimicrobials:       Subjective: Seen alongside patient's wife and daughter.   Continues to feel weak.   No worsening of shortness of breath.   No chest pain.   No fever or chills.      Objective: Vitals:   01/23/18 0700 01/23/18 0740 01/23/18 0801 01/23/18 1206  BP:  115/84  112/76  Pulse: 85 (!) 103 (!) 101 74  Resp: (!) 24 (!) 24 (!) 22 (!) 22  Temp:  98.9 F (37.2 C)  97.6 F (36.4 C)  TempSrc:  Axillary  Oral  SpO2: 97% 95% 96% 98%  Weight:      Height:        Intake/Output Summary (Last 24 hours) at 01/23/2018 1648 Last data filed at 01/23/2018 1500 Gross per 24 hour  Intake 86.77 ml  Output -  Net 86.77 ml   Filed Weights   01/19/18 1015  Weight: 80.3 kg    Examination:   General: Not in any distress. AAO X 3 Neurology: Awake and alert, Moves all limbs.  HEENT: Pallor. No jaundice.  [name] is improved significantly.   Cardiovascular: No JVD. S1-S2. Pulmonary: Clear to auscultation.   Gastrointestinal. Abdomen with no organomegaly, non tender, no rebound or guarding   Data Reviewed: I have personally reviewed following labs and imaging studies  CBC: Recent Labs  Lab 01/18/18 1045  WBC 21.7*  NEUTROABS 18.4*  HGB 12.2*  HCT 39.8  MCV 92.6  PLT 413   Basic Metabolic Panel: Recent Labs  Lab 01/18/18 1045 01/19/18 0939 01/20/18 0246 01/21/18 0229  NA 135 137 135 136  K 4.5 4.4 4.9 4.6  CL 99 100 99 98  CO2 22 22 24 26   GLUCOSE 230* 277* 323* 235*  BUN 28* 43* 54* 51*  CREATININE 1.53* 1.71* 1.71* 1.52*  CALCIUM 8.6* 8.3* 8.1* 8.1*  MG 1.9  --   --   --     GFR: Estimated Creatinine Clearance: 45.4 mL/min (A) (by C-G formula based on SCr of 1.52 mg/dL (H)). Liver Function Tests: Recent Labs  Lab 01/18/18 1045  AST 28  ALT 19  ALKPHOS 86  BILITOT 0.8  PROT 6.5  ALBUMIN 2.7*   No results for input(s): LIPASE, AMYLASE in the last 168 hours. No results for input(s): AMMONIA in the last 168 hours. Coagulation Profile: Recent Labs  Lab 01/19/18 0330 01/20/18 0246 01/21/18 0229 01/22/18 0246 01/23/18 0606  INR 3.48 4.26* 2.93 2.38 1.56   Cardiac Enzymes: No results for input(s): CKTOTAL, CKMB, CKMBINDEX, TROPONINI in the last 168 hours. BNP (last 3 results) Recent Labs    01/10/18 1657  PROBNP 123.0*   HbA1C: No results for input(s): HGBA1C in the last 72 hours. CBG: Recent Labs  Lab 01/22/18 1138 01/22/18 1619 01/22/18 2041 01/23/18 0738 01/23/18 1205  GLUCAP 262* 128* 115* 215* 181*   Lipid Profile: No results for input(s): CHOL, HDL, LDLCALC, TRIG, CHOLHDL, LDLDIRECT in the last 72 hours. Thyroid Function Tests: No results for input(s): TSH, T4TOTAL, FREET4, T3FREE, THYROIDAB in the last 72 hours. Anemia Panel: No results for input(s): VITAMINB12, FOLATE, FERRITIN, TIBC, IRON,  RETICCTPCT in the last 72 hours.    Radiology Studies: I have reviewed all of the imaging during this hospital visit personally     Scheduled Meds: . atorvastatin  80 mg Oral QHS  . feeding supplement (ENSURE ENLIVE)  237 mL Oral BID BM  . feeding supplement (GLUCERNA SHAKE)  237 mL Oral TID BM  . fluconazole  100 mg Oral Daily  . FLUoxetine  60 mg Oral QHS  . insulin aspart  0-15 Units Subcutaneous TID WC  . insulin aspart  0-5 Units Subcutaneous QHS  . insulin detemir  10 Units Subcutaneous Daily  . ipratropium-albuterol  3 mL Nebulization TID  . lidocaine  1 patch Transdermal Q24H  . methylPREDNISolone (SOLU-MEDROL) injection  60 mg Intravenous Q12H  . metoprolol tartrate  25 mg Oral BID  . nystatin  5 mL Oral QID  .  senna-docusate  1 tablet Oral BID  . simethicone  80 mg Oral Once  . sodium chloride flush  3 mL Intravenous Q12H   Continuous Infusions: . heparin 1,400 Units/hr (01/23/18 0847)     LOS: 5 days   Bonnell Public, MD [facility] Pager [phone]

## 2018-01-23 NOTE — ED Provider Notes (Signed)
[facility] Provider Note   CSN: [id number] Arrival date & time: 01/12/18  1311     History   Chief Complaint Chief Complaint  Patient presents with  . PE on CT scan    HPI [name] is a 77 y.o. male.  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  The patient states this is been progressively worsening for the past several weeks.  He reports associated dry cough and generalized weakness and fatigue.  Patient has been seen multiple times in the outpatient setting by primary care with no improvement in his symptoms.  They have been given multiple rounds of antibiotics to include doxycycline and Levaquin.  Wife at bedside states that they want a second opinion and saw pulmonologist yesterday.  Patient had an outpatient CT scan yesterday that showed a small subsegmental PE and patient was sent by pulmonologist to the ED for further evaluation.  Patient is currently anticoagulated warfarin and his INR has been therapeutic.  Last check was last week that was normal.  Patient reports ongoing exertional dyspnea.  Also reports ongoing cough.  Denies any mopped assist.  Denies any lower extremity swelling or calf tenderness.  Denies any fevers or chills.  Denies any URI symptoms.  None makes patient symptoms better or worse.  Pt denies any fever, chill, ha, vision changes, lightheadedness, dizziness, congestion, neck pain, cp,  abd pain, n/v/d, urinary symptoms, change in bowel habits, melena, hematochezia, lower extremity paresthesias.  Past Medical History:  Diagnosis Date  . Arthritis    hands   . Asthma   . Chronic atrial fibrillation    a.fib on warfarin- Dr. Percival Spanish follows  . Diverticulosis of colon (without mention of hemorrhage)   . ED (erectile dysfunction)   .  Fibromyalgia   . GERD (gastroesophageal reflux disease)   . Hard of hearing    wears hearing aids  . Headache   . History of blood clots    behind right knee and then went into left lung 35yrs ago  . History of colon polyps   . History of kidney stones   . Hx of diabetes mellitus    "no longer diabetic since gastric bypass" - no longer taking metformin"  in 2 months "problems with low blood sugar now"  . Hx of gout    but doesn't take any meds  . Inflammation of colonic mucosa    recent admission and [facility]-remains on oral antibiotic  . IPF (idiopathic pulmonary fibrosis) ([place])   . Obesity   . Peripheral vascular disease ([place])   . Pneumonia    last time about 54yrs ago  . Prostate cancer ([place])   . Pulmonary embolism ([place])    over 10 yrs ago "many years ago"  . Restless legs syndrome (RLS)   . Skin cancer   . Unspecified asthma(493.90)    as a child  . Unspecified essential hypertension    hx of no longer on medication due to gastric bypass     Patient Active Problem List   Diagnosis Date Noted  . Fall   . Goals of care, counseling/discussion   . Palliative care by specialist   . ILD (interstitial lung disease) ([place])   . Protein-calorie malnutrition, severe 01/19/2018  . Acute on chronic respiratory failure with hypoxia ([place]) 01/18/2018  . CKD (  chronic kidney disease) stage 3, GFR 30-59 ml/min (HCC) 01/18/2018  . Pulmonary embolus ([place])   . Abnormal finding on lung imaging 01/12/2018  . Pulmonary fibrosis ([place]) 01/12/2018  . Malignant neoplasm of prostate ([place]) 11/29/2017  . Dizziness 09/01/2017  . Decubitus ulcer of right ankle, stage 1 06/22/2017  . Chronic insomnia 06/22/2017  . Elevated LFTs 06/11/2017  . MRSA (methicillin resistant Staphylococcus aureus) infection 06/11/2017  . Acute pain of right knee 06/11/2017  . Hypotension 06/11/2017  . Primary localized osteoarthritis of right knee 04/27/2017  . Primary osteoarthritis of right knee  04/27/2017  . Acute neck pain 10/30/2016  . Anticoagulant long-term use 09/14/2016  . S/P panniculectomy 08/25/2016  . Persistent cough for 3 weeks or longer 02/28/2016  . Internal hernia 03/08/2015  . GERD (gastroesophageal reflux disease) 08/03/2014  . Acid reflux 08/03/2014  . ED (erectile dysfunction) of organic origin 07/02/2014  . Type 2 diabetes mellitus with neurologic complication ([place]) 94/70/9628  . Counseling regarding end of life decision making 01/23/2014  . Dysphagia, pharyngoesophageal phase 08/30/2013  . Benign essential tremor 08/30/2013  . Postoperative pain, acute, shoulder 05/13/2013  . Arthralgia of shoulder 05/13/2013  . Cervical spinal stenosis 05/09/2013  . Encounter for therapeutic drug monitoring 04/26/2013  . Peripheral autonomic neuropathy due to diabetes mellitus ([place]) 12/11/2011  . Hereditary and idiopathic neuropathy 12/11/2011  . 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 History of surgical procedure 09/03/2011  . Atrial fibrillation ([place]) 08/12/2011  . Dyspnea 12/08/2010  . Bacterial overgrowth syndrome 10/28/2010  . Nonsurgical dumping syndrome 10/14/2010  . 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 Diverticulitis of colon 09/02/2010  . Vascular disorder of lower extremity 08/01/2010  . Osteoporosis 11/26/2009  . Anemia of chronic disease 09/02/2009  . RENAL INSUFFICIENCY 09/02/2009  . Allergic rhinitis 01/17/2009  . Asthma, moderate persistent 11/13/2008  . Airway hyperreactivity 11/13/2008  . KNEE REPLACEMENT, LEFT, HX OF 12/18/2006  . HERPES ZOSTER W/NERVOUS COMPLICATION NEC [id number]/62/9476  . HYPERCHOLESTEROLEMIA 05/19/2006  . ANXIETY 05/19/2006  . Major depressive disorder, recurrent, mild ([place]) 05/19/2006  . RESTLESS LEG SYNDROME 05/19/2006  . IRRITABLE BOWEL SYNDROME 05/19/2006  . LOW BACK PAIN, CHRONIC 05/19/2006  . GAD (generalized anxiety disorder) 05/19/2006  . Diverticular disease of large intestine  05/19/2006  . Essential (primary) hypertension 05/19/2006  . Adaptive colitis 05/19/2006    Past Surgical History:  Procedure Laterality Date  . ANTERIOR CERVICAL DECOMP/DISCECTOMY FUSION N/A 05/09/2013   Procedure: ANTERIOR CERVICAL DECOMPRESSION/DISCECTOMY FUSION CERVICAL THREE-FOUR,CERVICAL FOUR-FIVE,CERVICAL FIVE-SIX;  Surgeon: Floyce Stakes, MD;  Location: [facility];  Service: Neurosurgery;  Laterality: N/A;  . ARTERY REPAIR     Left forearm  . BACK SURGERY     x 3  . BREATH TEK H PYLORI  01/08/2011   Procedure: BREATH TEK H PYLORI;  Surgeon: Pedro Earls, MD;  Location: [facility];  Service: General;  Laterality: N/A;  . CARDIOVERSION     x 2 attempts-unsuccessful.  [name] CATARACT EXTRACTION, BILATERAL    . COLONOSCOPY    . CYSTOSCOPY    . ESOPHAGOGASTRODUODENOSCOPY (EGD) WITH PROPOFOL N/A 09/21/2014   Procedure: ESOPHAGOGASTRODUODENOSCOPY (EGD) WITH PROPOFOL;  Surgeon: Manya Silvas, MD;  Location: [facility] END[facility];  Service: Endoscopy;  Laterality: N/A;  . EYE SURGERY     cataract bil  . FOOT SURGERY     Left foot   . GASTRIC ROUX-EN-Y  08/11/2011   Procedure: LAPAROSCOPIC ROUX-EN-Y GASTRIC BYPASS WITH UPPER ENDOSCOPY;  Surgeon: Isabel Caprice  Hassell Done, MD;  Location: [facility];  Service: General;  Laterality: N/A;  . HAND SURGERY     LEFT  . injections in back     x 18  . JOINT REPLACEMENT    . KNEE SURGERY Left    x 4  . KNEE SURGERY Left    arthroscopy  . LAPAROSCOPIC INTERNAL HERNIA REPAIR N/A 03/08/2015   Procedure: LAPAROSCOPIC INTERNAL HERNIA REPAIR ;  Surgeon: [name], MD;  Location: [facility];  Service: General;  Laterality: N/A;  . LEG SURGERY     FOR NECROTIZING FASCITIS L LEG AND GROIN  . PANNICULECTOMY N/A 08/25/2016   Procedure: PANNICULECTOMY;  Surgeon: [name], MD;  Location: [facility];  Service: General;  Laterality: N/A;  . PENILE PROSTHESIS IMPLANT N/A 07/02/2014   Procedure: PENILE PROTHESIS INFLATABLE 3 PIECE (COLOPLAST) SCROTAL APPROACH;   Surgeon: Kathie Rhodes, MD;  Location: [facility];  Service: Urology;  Laterality: N/A;  . PENILE PROSTHESIS IMPLANT N/A 11/23/2014   Procedure: EXPLORATION AND REVISION OF PENILE PROSTHESIS;  Surgeon: Kathie Rhodes, MD;  Location: [facility];  Service: Urology;  Laterality: N/A;  . PICC INSERTION W/OUT PORT/PUMP    . REPLACEMENT TOTAL KNEE Left    x 2  . SHOULDER ARTHROSCOPY    . SHOULDER SURGERY Left 2014  . TONSILLECTOMY    . TOTAL KNEE ARTHROPLASTY Right 04/27/2017  . TOTAL KNEE ARTHROPLASTY Right 04/27/2017   Procedure: TOTAL KNEE ARTHROPLASTY;  Surgeon: Melrose Nakayama, MD;  Location: [facility];  Service: Orthopedics;  Laterality: Right;  . TOTAL SHOULDER ARTHROPLASTY Left 01/26/2013   Procedure: TOTAL SHOULDER ARTHROPLASTY;  Surgeon: Nita Sells, MD;  Location: [facility];  Service: Orthopedics;  Laterality: Left;  Left total shoulder arthroplasty        Home Medications    Prior to Admission medications   Medication Sig Start Date End Date Taking? Authorizing Provider  acarbose (PRECOSE) 50 MG tablet Take 50 mg by mouth 3 (three) times daily with meals.   Yes [provider]  atorvastatin (LIPITOR) 80 MG tablet Take 1 tablet (80 mg total) by mouth at bedtime. 02/18/17  Yes Minus Breeding, MD  chlorpheniramine-HYDROcodone (TUSSIONEX) 10-8 MG/5ML SUER Take 5 mLs by mouth every 12 (twelve) hours as needed for cough. Patient taking differently: Take 1.5 mLs by mouth every 12 (twelve) hours as needed for cough.  01/05/18  Yes Lesleigh Noe, MD  FLUoxetine (PROZAC) 20 MG capsule TAKE 3 CAPSULES BY MOUTH EVERYDAY AT BEDTIME Patient taking differently: Take 60 mg by mouth at bedtime.  01/10/18  Yes Bedsole, Amy E, MD  furosemide (LASIX) 20 MG tablet Take 1 tablet (20 mg total) by mouth every other day. Patient taking differently: Take 20 mg by mouth daily.  08/10/17  Yes Duke, Tami Lin, PA  gabapentin (NEURONTIN) 300 MG capsule TAKE 1 CAPSULE BY MOUTH TWICE A DAY Patient taking  differently: Take 300 mg by mouth 2 (two) times daily.  08/19/17  Yes Tat, Eustace Quail, DO  hydrocortisone 2.5 % cream Apply 1 application topically 2 (two) times daily as needed (itching).  12/09/17  Yes [provider]  hydroxypropyl methylcellulose / hypromellose (ISOPTO TEARS / GONIOVISC) 2.5 % ophthalmic solution Place 1 drop into both eyes 2 (two) times daily as needed for dry eyes.   Yes [provider]  Omega-3 Fatty Acids (FISH OIL) 1000 MG CAPS Take 1,000 mg by mouth daily.   Yes [provider]  omeprazole (PRILOSEC) 40 MG capsule TAKE 1 CAPSULE (  40 MG TOTAL) BY MOUTH DAILY. Patient taking differently: Take 40 mg by mouth at bedtime.  11/11/17  Yes Bedsole, Amy E, MD  PRESCRIPTION MEDICATION Apply 1 g topically 4 (four) times daily as needed (pain). Baclofen/Gabapentin/Lidocaine Compund Cream   Yes [provider]  primidone (MYSOLINE) 50 MG tablet 150 mg in the morning, 100 mg at night Patient taking differently: Take 100-150 mg by mouth See admin instructions. 150 mg in the morning, 100 mg at night 03/08/17  Yes Tat, Eustace Quail, DO  ranitidine (ZANTAC) 300 MG tablet Take 300 mg by mouth at bedtime.  12/03/17  Yes [provider]  sucralfate (CARAFATE) 1 g tablet Take 1 g by mouth 3 (three) times daily before meals.  12/21/17  Yes [provider]  tamsulosin (FLOMAX) 0.4 MG CAPS capsule Take 0.4 mg by mouth at bedtime.  01/03/18  Yes [provider]  tiZANidine (ZANAFLEX) 4 MG tablet Take 4 mg by mouth every 6 (six) hours as needed for muscle spasms.  12/20/17  Yes [provider]  vitamin E 400 UNIT capsule Take 400 Units by mouth daily.   Yes [provider]  zolpidem (AMBIEN CR) 12.5 MG CR tablet Take 12.5 mg by mouth at bedtime.   Yes [provider]  benzonatate (TESSALON) 200 MG capsule Take 1 capsule (200 mg total) by mouth 3 (three) times daily as needed for cough. 01/14/18   Sheikh, Omair Latif, DO    ipratropium-albuterol (DUONEB) 0.5-2.5 (3) MG/3ML SOLN Take 3 mLs by nebulization every 6 (six) hours as needed. Patient taking differently: Take 3 mLs by nebulization every 6 (six) hours as needed (shortness of breath).  01/17/18   Martyn Ehrich, NP  predniSONE (DELTASONE) 10 MG tablet 4 tabs x 4 days, 3 tabs x 4 days; 2 tabs x 4 days; 1 tab x 4 days 01/17/18   Martyn Ehrich, NP  warfarin (COUMADIN) 2.5 MG tablet Take 2.5 mg by mouth daily. Take 2.5mg  SUN and THUR and 3.75mg  on all other days.    [provider]    Family History Family History  Problem Relation Age of Onset  . Uterine cancer Mother        mets  . Breast cancer Mother   . Cancer Mother        cervical  . Emphysema Father   . Alzheimer's disease Sister   . Prostate cancer Brother   . Heart attack Brother   . Colon cancer Brother 37    Social History Social History   Tobacco Use  . Smoking status: Never Smoker  . Smokeless tobacco: Never Used  Substance Use Topics  . Alcohol use: No    Alcohol/week: 0.0 standard drinks  . Drug use: No     Allergies   Sulfacetamide sodium; Latex; and Adhesive [tape]   Review of Systems Review of Systems  All other systems reviewed and are negative.    Physical Exam Updated Vital Signs BP 113/70 (BP Location: Right Arm)   Pulse 91   Temp 97.6 F (36.4 C) (Oral)   Resp 16   Ht 6' (1.829 m)   Wt 80.3 kg   SpO2 96%   BMI 24.01 kg/m   Physical Exam Vitals signs and nursing note reviewed.  Constitutional:      General: He is not in acute distress.    Appearance: He is well-developed. He is ill-appearing. He is not diaphoretic.  HENT:     Head: Normocephalic and atraumatic.  Nose: Nose normal.     Mouth/Throat:     Mouth: Mucous membranes are moist.  Eyes:     General:        Right eye: No discharge.        Left eye: No discharge.     Conjunctiva/sclera: Conjunctivae normal.     Pupils: Pupils are equal, round, and reactive to  light.  Neck:     Musculoskeletal: Normal range of motion and neck supple.     Vascular: No JVD.     Trachea: No tracheal deviation.  Cardiovascular:     Rate and Rhythm: Normal rate and regular rhythm.     Heart sounds: Normal heart sounds. No murmur. No friction rub. No gallop.   Pulmonary:     Effort: Pulmonary effort is normal. Tachypnea present. No respiratory distress.     Breath sounds: Decreased breath sounds present. No wheezing, rhonchi or rales.  Chest:     Chest wall: No tenderness.  Abdominal:     General: Bowel sounds are normal. There is no distension.     Palpations: Abdomen is soft.     Tenderness: There is no abdominal tenderness. There is no guarding or rebound.  Musculoskeletal: Normal range of motion.     Comments: No lower extremity edema or calf tenderness.  Lymphadenopathy:     Cervical: No cervical adenopathy.  Skin:    General: Skin is warm and dry.     Capillary Refill: Capillary refill takes less than 2 seconds.  Neurological:     Mental Status: He is alert and oriented to person, place, and time.  Psychiatric:        Behavior: Behavior normal.        Thought Content: Thought content normal.        Judgment: Judgment normal.      ED Treatments / Results  Labs (all labs ordered are listed, but only abnormal results are displayed) Labs Reviewed  CBC WITH DIFFERENTIAL/PLATELET - Abnormal; Notable for the following components:      Result Value   RBC 3.82 (*)    Hemoglobin 11.2 (*)    HCT 35.6 (*)    Monocytes Absolute 1.2 (*)    All other components within normal limits  COMPREHENSIVE METABOLIC PANEL - Abnormal; Notable for the following components:   Glucose, Bld 140 (*)    BUN 26 (*)    Creatinine, Ser 1.46 (*)    Calcium 8.3 (*)    Albumin 3.0 (*)    GFR calc non Af Amer 46 (*)    GFR calc Af Amer 53 (*)    All other components within normal limits  PROTIME-INR - Abnormal; Notable for the following components:   Prothrombin Time 28.7  (*)    All other components within normal limits  PROTIME-INR - Abnormal; Notable for the following components:   Prothrombin Time 27.1 (*)    All other components within normal limits  CBC - Abnormal; Notable for the following components:   RBC 3.86 (*)    Hemoglobin 11.4 (*)    HCT 35.3 (*)    All other components within normal limits  BASIC METABOLIC PANEL - Abnormal; Notable for the following components:   CO2 21 (*)    Glucose, Bld 204 (*)    BUN 27 (*)    Creatinine, Ser 1.40 (*)    Calcium 8.2 (*)    GFR calc non Af Amer 48 (*)    GFR calc Af Amer 56 (*)  All other components within normal limits  PROTIME-INR - Abnormal; Notable for the following components:   Prothrombin Time 29.6 (*)    All other components within normal limits  CBC WITH DIFFERENTIAL/PLATELET - Abnormal; Notable for the following components:   WBC 17.6 (*)    RBC 3.74 (*)    Hemoglobin 11.3 (*)    HCT 35.0 (*)    Neutro Abs 15.9 (*)    Lymphs Abs 0.6 (*)    Abs Immature Granulocytes 0.13 (*)    All other components within normal limits  COMPREHENSIVE METABOLIC PANEL - Abnormal; Notable for the following components:   Sodium 132 (*)    Glucose, Bld 307 (*)    BUN 33 (*)    Creatinine, Ser 1.43 (*)    Calcium 8.2 (*)    Total Protein 6.4 (*)    Albumin 2.9 (*)    GFR calc non Af Amer 47 (*)    GFR calc Af Amer 55 (*)    All other components within normal limits  ANTINUCLEAR ANTIBODIES, IFA - Abnormal; Notable for the following components:   ANA Ab, IFA Positive (*)    All other components within normal limits  SEDIMENTATION RATE - Abnormal; Notable for the following components:   Sed Rate 22 (*)    All other components within normal limits  GLUCOSE, CAPILLARY - Abnormal; Notable for the following components:   Glucose-Capillary 262 (*)    All other components within normal limits  FANA STAINING PATTERNS - Abnormal; Notable for the following components:   Homogeneous Pattern 1:160 (*)     All other components within normal limits  PROTIME-INR - Abnormal; Notable for the following components:   Prothrombin Time 37.3 (*)    All other components within normal limits  CBC WITH DIFFERENTIAL/PLATELET - Abnormal; Notable for the following components:   WBC 14.0 (*)    RBC 3.57 (*)    Hemoglobin 10.7 (*)    HCT 34.0 (*)    Neutro Abs 11.0 (*)    Monocytes Absolute 1.3 (*)    Abs Immature Granulocytes 0.13 (*)    All other components within normal limits  COMPREHENSIVE METABOLIC PANEL - Abnormal; Notable for the following components:   Glucose, Bld 205 (*)    BUN 36 (*)    Creatinine, Ser 1.47 (*)    Calcium 8.3 (*)    Total Protein 6.0 (*)    Albumin 2.8 (*)    GFR calc non Af Amer 46 (*)    GFR calc Af Amer 53 (*)    All other components within normal limits  GLUCOSE, CAPILLARY - Abnormal; Notable for the following components:   Glucose-Capillary 278 (*)    All other components within normal limits  GLUCOSE, CAPILLARY - Abnormal; Notable for the following components:   Glucose-Capillary 195 (*)    All other components within normal limits  GLUCOSE, CAPILLARY - Abnormal; Notable for the following components:   Glucose-Capillary 186 (*)    All other components within normal limits  GLUCOSE, CAPILLARY - Abnormal; Notable for the following components:   Glucose-Capillary 173 (*)    All other components within normal limits  INFLUENZA PANEL BY PCR (TYPE A & B)  MAGNESIUM  PHOSPHORUS  ANA W/REFLEX IF POSITIVE  CYCLIC CITRUL PEPTIDE ANTIBODY, IGG/IGA  EXTRACTABLE NUCLEAR ANTIGEN ANTIBODY  CYCLIC CITRUL PEPTIDE ANTIBODY, IGG/IGA  MAGNESIUM  PHOSPHORUS    EKG EKG Interpretation  Date/Time:  Wednesday January 12 2018 14:41:07 EST Ventricular Rate:  81 PR Interval:    QRS Duration: 87 QT Interval:  395 QTC Calculation: 459 R Axis:   37 Text Interpretation:  Atrial fibrillation Ventricular premature complex When compared to prior, similar Afib No STEMI Confirmed  by Antony Blackbird [phone]) on 01/12/2018 2:46:04 PM   Radiology Dg Chest Port 1 View  Result Date: 01/22/2018 CLINICAL DATA:  Interstitial lung disease. EXAM: PORTABLE CHEST 1 VIEW COMPARISON:  Radiograph January 18, 2018. FINDINGS: Stable cardiomediastinal silhouette. Stable diffuse interstitial densities are noted throughout both lungs which may represent chronic interstitial lung disease, but acute superimposed inflammation or edema could not be excluded. No pneumothorax is noted. Status post left shoulder arthroplasty. IMPRESSION: Stable diffuse interstitial densities are noted throughout both lungs which may represent chronic interstitial lung disease, but acute superimposed edema or inflammation can not be excluded. Electronically Signed   By: Marijo Conception, M.D.   On: 01/22/2018 09:13    Procedures Procedures (including critical care time)  Medications Ordered in ED Medications  warfarin (COUMADIN) tablet 3.5 mg (3.5 mg Oral Given 01/12/18 2113)  warfarin (COUMADIN) tablet 4 mg (4 mg Oral Given 01/13/18 1809)  furosemide (LASIX) injection 20 mg (20 mg Intravenous Given 01/13/18 1150)  HYDROcodone-homatropine (HYCODAN) 5-1.5 MG/5ML syrup 5 mL (5 mLs Oral Given 01/15/18 0037)     Initial Impression / Assessment and Plan / ED Course  I have reviewed the triage vital signs and the nursing notes.  Pertinent labs & imaging results that were available during my care of the patient were reviewed by me and considered in my medical decision making (see chart for details).     Patient presents to the ED for evaluation of ongoing dyspnea.  Sent by pulmonologist for new PE noted on CT scan yesterday.  Patient is on warfarin for history of PEs.  His INR is therapeutic.  Patient is tachypneic on exam today.  He is satting in the low 90s on room air.  Patient placed on 2 L of oxygen.  Tachypnea noted.  No tachycardia patient is afebrile.  Sodium 135, potassium 4.6, chloride 100, bicarb 26, glucose  140, BUN 26, creatinine 1.46, white count 9.7, hemoglobin 9.2, hematocrit 35.6, platelets 309.  INR 2.7 chest CT with advanced interstitial, basilar predominant fibrotic lung disease, scattered areas of groundglass attenuation associated with bronchiectasis.  Tiny segmental to subsegmental pulmonary embolism in the left upper lobe with potential minuscule focus of embolism in the distal pulmonary artery to the lingula.  Enlargement of pulmonary arteries, asymmetric irregular wall thickening in the distal esophagus.   Had long discussion with pharmacy and hematology oncology concerning patient's new PE.  Patient is on warfarin and INR is therapeutic today.  Hematology oncology suggest that I treat patient like new PE given that he is symptomatic.  They have asked that I start patient on heparin.  I did consult pharmacy to dose this as well.  Patient is new oxygen requirement will admit patient to hospital for further evaluation.  I spoke with Dr.Arrien who agrees to admission will see patient in the ED and place admission orders.  Patient remains hemodynamically stable.  Discussed work-up and plan of care with family who is agreeable the above plan.    Final Clinical Impressions(s) / ED Diagnoses   Final diagnoses:  SOB (shortness of breath)  Pulmonary fibrosis ([place])    ED Discharge Orders         Ordered    apixaban (ELIQUIS) 5 MG TABS tablet  Status:  Discontinued  01/15/18 1347    Increase activity slowly     01/15/18 1351    Diet - low sodium heart healthy     01/15/18 1351    Discharge instructions    Comments:  You were cared for by a hospitalist during your hospital stay. If you have any questions about your discharge medications or the care you received while you were in the hospital after you are discharged, you can call the unit and ask to speak with the hospitalist on call if the hospitalist that took care of you is not available. Once you are discharged, your primary care  physician will handle any further medical issues. Please note that NO REFILLS for any discharge medications will be authorized once you are discharged, as it is imperative that you return to your primary care physician (or establish a relationship with a primary care physician if you do not have one) for your aftercare needs so that they can reassess your need for medications and monitor your lab values.  Follow up with PCP, Pulmonary, and Cardiology as well as Hematology  in the outpatient setting. Take all medications as prescribed. If symptoms change or worsen please return to the ED for evaluation   01/15/18 1351    Call MD for:  temperature >100.4     01/15/18 1351    Call MD for:  persistant nausea and vomiting     01/15/18 1351    AMB referral to pulmonary rehabilitation     01/15/18 1351    Call MD for:  severe uncontrolled pain     01/15/18 1351    Call MD for:  redness, tenderness, or signs of infection (pain, swelling, redness, odor or green/yellow discharge around incision site)     01/15/18 1351    Call MD for:  difficulty breathing, headache or visual disturbances     01/15/18 1351    Call MD for:  hives     01/15/18 1351    Call MD for:  persistant dizziness or light-headedness     01/15/18 1351    Call MD for:  extreme fatigue     01/15/18 1351    benzonatate (TESSALON) 200 MG capsule  3 times daily PRN     01/14/18 1334           Doristine Devoid, PA-C 01/23/18 1446    Tegeler, Gwenyth Allegra, MD 01/24/18 1402

## 2018-01-23 NOTE — Progress Notes (Signed)
ANTICOAGULATION CONSULT NOTE - Initial Consult  Pharmacy Consult for Heparin when INR <2 Indication: atrial fibrillation and pulmonary embolus (dx 12/4)  Allergies  Allergen Reactions  . Sulfacetamide Sodium Swelling    throat swelling  . Latex Itching    Severe contact dermatitis  . Adhesive [Tape] Other (See Comments)    Tears skin-- "No tape of any kind, they all tear skin right off"    Patient Measurements: Height: 6' (182.9 cm) Weight: 177 lb 0.5 oz (80.3 kg) IBW/kg (Calculated) : 77.6 Heparin Dosing Weight: 80 kg  Vital Signs: Temp: 97.6 F (36.4 C) (12/15 1206) Temp Source: Oral (12/15 1206) BP: 112/76 (12/15 1206) Pulse Rate: 74 (12/15 1206)  Labs: Recent Labs    01/21/18 0229 01/22/18 0246 01/23/18 0606 01/23/18 1511  LABPROT 30.2* 25.7* 18.5*  --   INR 2.93 2.38 1.56  --   HEPARINUNFRC  --   --   --  0.43  CREATININE 1.52*  --   --   --     Estimated Creatinine Clearance: 45.4 mL/min (A) (by C-G formula based on SCr of 1.52 mg/dL (H)).   Medical History: Past Medical History:  Diagnosis Date  . Arthritis    hands   . Asthma   . Chronic atrial fibrillation    a.fib on warfarin- Dr. Percival Spanish follows  . Diverticulosis of colon (without mention of hemorrhage)   . ED (erectile dysfunction)   . Fibromyalgia   . GERD (gastroesophageal reflux disease)   . Hard of hearing    wears hearing aids  . Headache   . History of blood clots    behind right knee and then went into left lung 96yrs ago  . History of colon polyps   . History of kidney stones   . Hx of diabetes mellitus    "no longer diabetic since gastric bypass" - no longer taking metformin"  in 2 months "problems with low blood sugar now"  . Hx of gout    but doesn't take any meds  . Inflammation of colonic mucosa    recent admission and release from [facility] 02-28-15-remains on oral antibiotic  . IPF (idiopathic pulmonary fibrosis) ([place])   . Obesity   . Peripheral vascular disease ([place])   .  Pneumonia    last time about 14yrs ago  . Prostate cancer ([place])   . Pulmonary embolism ([place])    over 10 yrs ago "many years ago"  . Restless legs syndrome (RLS)   . Skin cancer   . Unspecified asthma(493.90)    as a child  . Unspecified essential hypertension    hx of no longer on medication due to gastric bypass     Medications:  Medications Prior to Admission  Medication Sig Dispense Refill Last Dose  . acarbose (PRECOSE) 50 MG tablet Take 50 mg by mouth 3 (three) times daily with meals.   01/17/2018 at Unknown time  . atorvastatin (LIPITOR) 80 MG tablet Take 1 tablet (80 mg total) by mouth at bedtime. 90 tablet 3 01/17/2018 at Unknown time  . benzonatate (TESSALON) 200 MG capsule Take 1 capsule (200 mg total) by mouth 3 (three) times daily as needed for cough. 20 capsule 0 01/17/2018 at Unknown time  . chlorpheniramine-HYDROcodone (TUSSIONEX) 10-8 MG/5ML SUER Take 5 mLs by mouth every 12 (twelve) hours as needed for cough. (Patient taking differently: Take 1.5 mLs by mouth every 12 (twelve) hours as needed for cough. ) 140 mL 0 Past Week at Unknown time  .  FLUoxetine (PROZAC) 20 MG capsule TAKE 3 CAPSULES BY MOUTH EVERYDAY AT BEDTIME (Patient taking differently: Take 60 mg by mouth at bedtime. ) 270 capsule 1 01/17/2018 at Unknown time  . furosemide (LASIX) 20 MG tablet Take 1 tablet (20 mg total) by mouth every other day. (Patient taking differently: Take 20 mg by mouth daily. ) 180 tablet 1 01/17/2018 at Unknown time  . gabapentin (NEURONTIN) 300 MG capsule TAKE 1 CAPSULE BY MOUTH TWICE A DAY (Patient taking differently: Take 300 mg by mouth 2 (two) times daily. ) 180 capsule 1 01/17/2018 at Unknown time  . hydrocortisone 2.5 % cream Apply 1 application topically 2 (two) times daily as needed (itching).   2 Past Week at Unknown time  . hydroxypropyl methylcellulose / hypromellose (ISOPTO TEARS / GONIOVISC) 2.5 % ophthalmic solution Place 1 drop into both eyes 2 (two) times daily as needed for  dry eyes.   Past Week at Unknown time  . Omega-3 Fatty Acids (FISH OIL) 1000 MG CAPS Take 1,000 mg by mouth daily.   01/17/2018 at Unknown time  . omeprazole (PRILOSEC) 40 MG capsule TAKE 1 CAPSULE (40 MG TOTAL) BY MOUTH DAILY. (Patient taking differently: Take 40 mg by mouth at bedtime. ) 90 capsule 1 01/17/2018 at Unknown time  . predniSONE (DELTASONE) 10 MG tablet 4 tabs x 4 days, 3 tabs x 4 days; 2 tabs x 4 days; 1 tab x 4 days 40 tablet 0 01/17/2018 at Unknown time  . PRESCRIPTION MEDICATION Apply 1 g topically 4 (four) times daily as needed (pain). Baclofen/Gabapentin/Lidocaine Compund Cream   01/17/2018 at Unknown time  . primidone (MYSOLINE) 50 MG tablet 150 mg in the morning, 100 mg at night (Patient taking differently: Take 100-150 mg by mouth See admin instructions. 150 mg in the morning, 100 mg at night) 450 tablet 1 01/17/2018 at Unknown time  . ranitidine (ZANTAC) 300 MG tablet Take 300 mg by mouth at bedtime.   3 01/17/2018 at Unknown time  . sucralfate (CARAFATE) 1 g tablet Take 1 g by mouth 3 (three) times daily before meals.   6 01/18/2018 at Unknown time  . tamsulosin (FLOMAX) 0.4 MG CAPS capsule Take 0.4 mg by mouth at bedtime.    01/17/2018 at Unknown time  . tiZANidine (ZANAFLEX) 4 MG tablet Take 4 mg by mouth every 6 (six) hours as needed for muscle spasms.    Past Month at Unknown time  . vitamin E 400 UNIT capsule Take 400 Units by mouth daily.   01/17/2018 at Unknown time  . warfarin (COUMADIN) 2.5 MG tablet Take 2.5 mg by mouth daily. Take 2.5mg  SUN and THUR and 3.75mg  on all other days.   01/17/2018 at 2000  . zolpidem (AMBIEN CR) 12.5 MG CR tablet Take 12.5 mg by mouth at bedtime.   01/17/2018 at Unknown time  . ipratropium-albuterol (DUONEB) 0.5-2.5 (3) MG/3ML SOLN Take 3 mLs by nebulization every 6 (six) hours as needed. (Patient taking differently: Take 3 mLs by nebulization every 6 (six) hours as needed (shortness of breath). ) 360 mL 0     Assessment: 77 yo M with recent  admission 12/4 > 12/8 for mgmt of pulm disease.  Noted at that time to have new PE on CT scan.  Pt was on Coumadin with therapeutic INR at that time but declined LMWH and was unable to afford Eliqus/Xarelto. Pharmacy has been asked to initiate heparin when INR <2 for recent PE and hx afib.  Pt started on heparin  with subtherapeutic INR this morning. Initial heparin level at goal, will confirm in am with daily labs.  Goal of Therapy:  Heparin level 0.3-0.7 units/ml Monitor platelets by anticoagulation protocol: Yes    Plan:  -Continue heparin 1400 units/hr -Recheck heparin level with daily lab draw  Arrie Senate, PharmD, BCPS Clinical Pharmacist Please check AMION for all [place] numbers 01/23/2018

## 2018-01-23 NOTE — Progress Notes (Signed)
ANTICOAGULATION CONSULT NOTE - Initial Consult  Pharmacy Consult for Heparin when INR <2 Indication: atrial fibrillation and pulmonary embolus (dx 12/4)  Allergies  Allergen Reactions  . Sulfacetamide Sodium Swelling    throat swelling  . Latex Itching    Severe contact dermatitis  . Adhesive [Tape] Other (See Comments)    Tears skin-- "No tape of any kind, they all tear skin right off"    Patient Measurements: Height: 6' (182.9 cm) Weight: 177 lb 0.5 oz (80.3 kg) IBW/kg (Calculated) : 77.6 Heparin Dosing Weight: 80 kg  Vital Signs: Temp: 97.6 F (36.4 C) (12/15 0023) Temp Source: Oral (12/15 0023) BP: 124/70 (12/15 0600) Pulse Rate: 85 (12/15 0700)  Labs: Recent Labs    01/21/18 0229 01/22/18 0246 01/23/18 0606  LABPROT 30.2* 25.7* 18.5*  INR 2.93 2.38 1.56  CREATININE 1.52*  --   --     Estimated Creatinine Clearance: 45.4 mL/min (A) (by C-G formula based on SCr of 1.52 mg/dL (H)).   Medical History: Past Medical History:  Diagnosis Date  . Arthritis    hands   . Asthma   . Chronic atrial fibrillation    a.fib on warfarin- Dr. Percival Spanish follows  . Diverticulosis of colon (without mention of hemorrhage)   . ED (erectile dysfunction)   . Fibromyalgia   . GERD (gastroesophageal reflux disease)   . Hard of hearing    wears hearing aids  . Headache   . History of blood clots    behind right knee and then went into left lung 23yrs ago  . History of colon polyps   . History of kidney stones   . Hx of diabetes mellitus    "no longer diabetic since gastric bypass" - no longer taking metformin"  in 2 months "problems with low blood sugar now"  . Hx of gout    but doesn't take any meds  . Inflammation of colonic mucosa    recent admission and release from [facility] 02-28-15-remains on oral antibiotic  . IPF (idiopathic pulmonary fibrosis) ([place])   . Obesity   . Peripheral vascular disease ([place])   . Pneumonia    last time about 85yrs ago  . Prostate cancer ([place])    . Pulmonary embolism ([place])    over 10 yrs ago "many years ago"  . Restless legs syndrome (RLS)   . Skin cancer   . Unspecified asthma(493.90)    as a child  . Unspecified essential hypertension    hx of no longer on medication due to gastric bypass     Medications:  Medications Prior to Admission  Medication Sig Dispense Refill Last Dose  . acarbose (PRECOSE) 50 MG tablet Take 50 mg by mouth 3 (three) times daily with meals.   01/17/2018 at Unknown time  . atorvastatin (LIPITOR) 80 MG tablet Take 1 tablet (80 mg total) by mouth at bedtime. 90 tablet 3 01/17/2018 at Unknown time  . benzonatate (TESSALON) 200 MG capsule Take 1 capsule (200 mg total) by mouth 3 (three) times daily as needed for cough. 20 capsule 0 01/17/2018 at Unknown time  . chlorpheniramine-HYDROcodone (TUSSIONEX) 10-8 MG/5ML SUER Take 5 mLs by mouth every 12 (twelve) hours as needed for cough. (Patient taking differently: Take 1.5 mLs by mouth every 12 (twelve) hours as needed for cough. ) 140 mL 0 Past Week at Unknown time  . FLUoxetine (PROZAC) 20 MG capsule TAKE 3 CAPSULES BY MOUTH EVERYDAY AT BEDTIME (Patient taking differently: Take 60 mg by mouth at  bedtime. ) 270 capsule 1 01/17/2018 at Unknown time  . furosemide (LASIX) 20 MG tablet Take 1 tablet (20 mg total) by mouth every other day. (Patient taking differently: Take 20 mg by mouth daily. ) 180 tablet 1 01/17/2018 at Unknown time  . gabapentin (NEURONTIN) 300 MG capsule TAKE 1 CAPSULE BY MOUTH TWICE A DAY (Patient taking differently: Take 300 mg by mouth 2 (two) times daily. ) 180 capsule 1 01/17/2018 at Unknown time  . hydrocortisone 2.5 % cream Apply 1 application topically 2 (two) times daily as needed (itching).   2 Past Week at Unknown time  . hydroxypropyl methylcellulose / hypromellose (ISOPTO TEARS / GONIOVISC) 2.5 % ophthalmic solution Place 1 drop into both eyes 2 (two) times daily as needed for dry eyes.   Past Week at Unknown time  . Omega-3 Fatty Acids (FISH  OIL) 1000 MG CAPS Take 1,000 mg by mouth daily.   01/17/2018 at Unknown time  . omeprazole (PRILOSEC) 40 MG capsule TAKE 1 CAPSULE (40 MG TOTAL) BY MOUTH DAILY. (Patient taking differently: Take 40 mg by mouth at bedtime. ) 90 capsule 1 01/17/2018 at Unknown time  . predniSONE (DELTASONE) 10 MG tablet 4 tabs x 4 days, 3 tabs x 4 days; 2 tabs x 4 days; 1 tab x 4 days 40 tablet 0 01/17/2018 at Unknown time  . PRESCRIPTION MEDICATION Apply 1 g topically 4 (four) times daily as needed (pain). Baclofen/Gabapentin/Lidocaine Compund Cream   01/17/2018 at Unknown time  . primidone (MYSOLINE) 50 MG tablet 150 mg in the morning, 100 mg at night (Patient taking differently: Take 100-150 mg by mouth See admin instructions. 150 mg in the morning, 100 mg at night) 450 tablet 1 01/17/2018 at Unknown time  . ranitidine (ZANTAC) 300 MG tablet Take 300 mg by mouth at bedtime.   3 01/17/2018 at Unknown time  . sucralfate (CARAFATE) 1 g tablet Take 1 g by mouth 3 (three) times daily before meals.   6 01/18/2018 at Unknown time  . tamsulosin (FLOMAX) 0.4 MG CAPS capsule Take 0.4 mg by mouth at bedtime.    01/17/2018 at Unknown time  . tiZANidine (ZANAFLEX) 4 MG tablet Take 4 mg by mouth every 6 (six) hours as needed for muscle spasms.    Past Month at Unknown time  . vitamin E 400 UNIT capsule Take 400 Units by mouth daily.   01/17/2018 at Unknown time  . warfarin (COUMADIN) 2.5 MG tablet Take 2.5 mg by mouth daily. Take 2.5mg  SUN and THUR and 3.75mg  on all other days.   01/17/2018 at 2000  . zolpidem (AMBIEN CR) 12.5 MG CR tablet Take 12.5 mg by mouth at bedtime.   01/17/2018 at Unknown time  . ipratropium-albuterol (DUONEB) 0.5-2.5 (3) MG/3ML SOLN Take 3 mLs by nebulization every 6 (six) hours as needed. (Patient taking differently: Take 3 mLs by nebulization every 6 (six) hours as needed (shortness of breath). ) 360 mL 0     Assessment: 77 yo M with recent admission 12/4 > 12/8 for mgmt of pulm disease.  Noted at that time to  have new PE on CT scan.  Pt was on Coumadin with therapeutic INR at that time but declined LMWH and was unable to afford Eliqus/Xarelto. Pharmacy has been asked to initiate heparin when INR <2 for recent PE and hx afib.  INR 1.56 this morning, will initiate heparin drip.  Goal of Therapy:  Heparin level 0.3-0.7 units/ml Monitor platelets by anticoagulation protocol: Yes  Plan:  Initiate Heparin gtt 1400 units/hr 6 hr heparin level  Daily monitoring of heparin level, CBC, s/s bleeding Follow-up coumadin restart  Harrietta Guardian, PharmD PGY1 Pharmacy Resident 01/23/2018    7:49 AM Please check AMION for all [place] numbers

## 2018-01-24 DIAGNOSIS — Z9884 Bariatric surgery status: Secondary | ICD-10-CM

## 2018-01-24 DIAGNOSIS — I739 Peripheral vascular disease, unspecified: Secondary | ICD-10-CM

## 2018-01-24 DIAGNOSIS — Z86711 Personal history of pulmonary embolism: Secondary | ICD-10-CM

## 2018-01-24 DIAGNOSIS — J8410 Pulmonary fibrosis, unspecified: Secondary | ICD-10-CM

## 2018-01-24 DIAGNOSIS — E1149 Type 2 diabetes mellitus with other diabetic neurological complication: Secondary | ICD-10-CM

## 2018-01-24 DIAGNOSIS — M797 Fibromyalgia: Secondary | ICD-10-CM

## 2018-01-24 DIAGNOSIS — D72829 Elevated white blood cell count, unspecified: Secondary | ICD-10-CM

## 2018-01-24 DIAGNOSIS — I4819 Other persistent atrial fibrillation: Secondary | ICD-10-CM

## 2018-01-24 LAB — RENAL FUNCTION PANEL
Albumin: 2.2 g/dL — ABNORMAL LOW (ref 3.5–5.0)
Anion gap: 12 (ref 5–15)
BUN: 34 mg/dL — ABNORMAL HIGH (ref 8–23)
CO2: 23 mmol/L (ref 22–32)
Calcium: 7.9 mg/dL — ABNORMAL LOW (ref 8.9–10.3)
Chloride: 99 mmol/L (ref 98–111)
Creatinine, Ser: 1.29 mg/dL — ABNORMAL HIGH (ref 0.61–1.24)
GFR calc Af Amer: >60 mL/min (ref >60)
GFR calc non Af Amer: 53 mL/min — ABNORMAL LOW (ref >60)
Glucose, Bld: 192 mg/dL — ABNORMAL HIGH (ref 70–99)
Phosphorus: 3.2 mg/dL (ref 2.5–4.6)
Potassium: 4.9 mmol/L (ref 3.5–5.1)
Sodium: 134 mmol/L — ABNORMAL LOW (ref 135–145)

## 2018-01-24 LAB — GLUCOSE, CAPILLARY
Glucose-Capillary: 93 mg/dL (ref 70–99)
Glucose-Capillary: 128 mg/dL — ABNORMAL HIGH (ref 70–99)
Glucose-Capillary: 130 mg/dL — ABNORMAL HIGH (ref 70–99)
Glucose-Capillary: 184 mg/dL — ABNORMAL HIGH (ref 70–99)
Glucose-Capillary: 235 mg/dL — ABNORMAL HIGH (ref 70–99)
Glucose-Capillary: 201 mg/dL — ABNORMAL HIGH (ref 70–99)
Glucose-Capillary: 156 mg/dL — ABNORMAL HIGH (ref 70–99)

## 2018-01-24 LAB — CBC WITH DIFFERENTIAL/PLATELET
Abs Immature Granulocytes: 0.26 10*3/uL — ABNORMAL HIGH (ref 0.00–0.07)
Basophils Absolute: 0 10*3/uL (ref 0.0–0.1)
Basophils Relative: 0 %
Eosinophils Absolute: 0 10*3/uL (ref 0.0–0.5)
Eosinophils Relative: 0 %
HCT: 38.8 % — ABNORMAL LOW (ref 39.0–52.0)
Hemoglobin: 12.2 g/dL — ABNORMAL LOW (ref 13.0–17.0)
Immature Granulocytes: 1 %
Lymphocytes Relative: 2 %
Lymphs Abs: 0.6 10*3/uL — ABNORMAL LOW (ref 0.7–4.0)
MCH: 28.5 pg (ref 26.0–34.0)
MCHC: 31.4 g/dL (ref 30.0–36.0)
MCV: 90.7 fL (ref 80.0–100.0)
Monocytes Absolute: 1.2 10*3/uL — ABNORMAL HIGH (ref 0.1–1.0)
Monocytes Relative: 4 %
Neutro Abs: 24.8 10*3/uL — ABNORMAL HIGH (ref 1.7–7.7)
Neutrophils Relative %: 93 %
Platelets: 190 10*3/uL (ref 150–400)
RBC: 4.28 MIL/uL (ref 4.22–5.81)
RDW: 13.2 % (ref 11.5–15.5)
WBC: 26.9 10*3/uL — ABNORMAL HIGH (ref 4.0–10.5)
nRBC: 0 % (ref 0.0–0.2)

## 2018-01-24 LAB — PROTIME-INR
INR: 1.57
Prothrombin Time: 18.6 seconds — ABNORMAL HIGH (ref 11.4–15.2)

## 2018-01-24 LAB — MAGNESIUM: Magnesium: 2.3 mg/dL (ref 1.7–2.4)

## 2018-01-24 LAB — HEPARIN LEVEL (UNFRACTIONATED): Heparin Unfractionated: 0.42 IU/mL (ref 0.30–0.70)

## 2018-01-24 MED ORDER — BISACODYL 10 MG RE SUPP: 10.0000 mg | Freq: Every day | RECTAL | Status: DC | PRN

## 2018-01-24 MED ORDER — ENOXAPARIN SODIUM 120 MG/0.8ML ~~LOC~~ SOLN
120.0000 mg | SUBCUTANEOUS | Status: DC
  Administered 2018-01-24 – 2018-01-29 (×6): 120 mg via SUBCUTANEOUS
  Filled 2018-01-24 (×7): qty 0.8

## 2018-01-24 NOTE — Progress Notes (Addendum)
[facility] TEAM 1 - Stepdown/ICU TEAM  [name]  ZOX:[id number] DOB: 11/25/1940 DOA: 01/18/2018 PCP: Jinny Sanders, MD    Brief Narrative:  77 year old male w/ a hx of hypertension, PE, PVD, DM2,atrial fibrillation, gastric bypass, chronic hypoxic respiratory failure due to presumed interstitial lung disease, and prostate cancer who was seen at the pulmonary clinic after a recent discharge from [facility], placedon steroids, and advised to increase hissupplemental oxygen. He presented to the ED with worsening weakness, mechanical fall, and respiratory distress.    Subjective: Reports ongoing chest wall pain from his recent fall onto his walker.  States that shortness of breath is without significant change.  Denies nausea or vomiting.  Reports slowly improving appetite.  Is highly motivated to participate in PT and OT in hopes of markedly improving his mobility.  Assessment & Plan:  Acute on chronic hypoxemic respiratory failure / Possible IPF exacerbation high dose systemic steroids - follow with the pulmonary fibrosis/ILD clinic on discharge - med tx per PCCM   New onset atrial fibrillation  Heart rate well controlled -already anticoagulated  Hx of Pulmonary embolism Recurred while therapeutic on warfarin - PCCM suggests enoxaparin long-term as the patient cannot afford DOAC  Coag negative staph in blood culture 1 of 2 -clinically most consistent with a contaminant -no clinical evidence to suggest active infection  DM2 with hyperglycemia CBG reasonably controlled  AKI on CKD stage 3 Creatinine stable at approximate baseline  Prostate Cancer Follow-up with the urologist and primary care provider on discharge -asymptomatic  Oral candidiasis with possible candidal esophagitis: Due to prolonged use of steroids - complete course of Diflucan and nystatin  DVT prophylaxis: IV heparin > lovenox  Code Status: DNR - NO CODE Family Communication: Spoke with wife  and brother at bedside Disposition Plan: awaiting CIR stay   Consultants:  PCCM Palliative Care   Antimicrobials:  none  Objective: Blood pressure 101/78, pulse 91, temperature 98.5 F (36.9 C), temperature source Oral, resp. rate (!) 24, height 6' (1.829 m), weight 80.3 kg, SpO2 98 %.  Intake/Output Summary (Last 24 hours) at 01/24/2018 0916 Last data filed at 01/24/2018 0600 Gross per 24 hour  Intake 294.53 ml  Output 475 ml  Net -180.47 ml   Filed Weights   01/19/18 1015  Weight: 80.3 kg    Examination: General: dyspnea at rest but able to complete full sentences  Lungs: Fine crackles primarily in bilateral bases with no wheezing Cardiovascular: Irregularly irregular without murmur or rub Abdomen: Nontender, nondistended, soft, bowel sounds positive, no rebound, no ascites, no appreciable mass Extremities: No significant cyanosis, clubbing, or edema bilateral lower extremities  CBC: Recent Labs  Lab 01/18/18 1045 01/24/18 0225  WBC 21.7* 26.9*  NEUTROABS 18.4* 24.8*  HGB 12.2* 12.2*  HCT 39.8 38.8*  MCV 92.6 90.7  PLT 200 811   Basic Metabolic Panel: Recent Labs  Lab 01/18/18 1045  01/20/18 0246 01/21/18 0229 01/24/18 0225  NA 135   < > 135 136 134*  K 4.5   < > 4.9 4.6 4.9  CL 99   < > 99 98 99  CO2 22   < > 24 26 23   GLUCOSE 230*   < > 323* 235* 192*  BUN 28*   < > 54* 51* 34*  CREATININE 1.53*   < > 1.71* 1.52* 1.29*  CALCIUM 8.6*   < > 8.1* 8.1* 7.9*  MG 1.9  --   --   --  2.3  PHOS  --   --   --   --  3.2   < > = values in this interval not displayed.   GFR: Estimated Creatinine Clearance: 53.5 mL/min (A) (by C-G formula based on SCr of 1.29 mg/dL (H)).  Liver Function Tests: Recent Labs  Lab 01/18/18 1045 01/24/18 0225  AST 28  --   ALT 19  --   ALKPHOS 86  --   BILITOT 0.8  --   PROT 6.5  --   ALBUMIN 2.7* 2.2*    Coagulation Profile: Recent Labs  Lab 01/20/18 0246 01/21/18 0229 01/22/18 0246 01/23/18 0606 01/24/18 0225    INR 4.26* 2.93 2.38 1.56 1.57    HbA1C: Hgb A1c MFr Bld  Date/Time Value Ref Range Status  10/14/2017 07:52 AM 6.6 (H) 4.6 - 6.5 % Final    Comment:    Glycemic Control Guidelines for People with Diabetes:Non Diabetic:  <6%Goal of Therapy: <7%Additional Action Suggested:  >8%   04/16/2017 10:02 AM 7.9 (H) 4.8 - 5.6 % Final    Comment:    (NOTE)         Prediabetes: 5.7 - 6.4         Diabetes: >6.4         Glycemic control for adults with diabetes: <7.0     CBG: Recent Labs  Lab 01/23/18 1205 01/23/18 1644 01/23/18 1757 01/23/18 2055 01/24/18 0803  GLUCAP 181* 93 128* 130* 184*    Recent Results (from the past 240 hour(s))  Blood culture (routine x 2)     Status: Abnormal   Collection Time: 01/18/18  2:58 PM  Result Value Ref Range Status   Specimen Description BLOOD RIGHT ANTECUBITAL  Final   Special Requests   Final    BOTTLES DRAWN AEROBIC AND ANAEROBIC Blood Culture adequate volume   Culture  Setup Time   Final    AEROBIC BOTTLE ONLY GRAM POSITIVE COCCI Organism ID to follow CRITICAL RESULT CALLED TO, READ BACK BY AND VERIFIED WITH: A MEYER PHARMD 01/19/18 1855 JDW    Culture (A)  Final    STAPHYLOCOCCUS SPECIES (COAGULASE NEGATIVE) THE SIGNIFICANCE OF ISOLATING THIS ORGANISM FROM A SINGLE SET OF BLOOD CULTURES WHEN MULTIPLE SETS ARE DRAWN IS UNCERTAIN. PLEASE NOTIFY THE MICROBIOLOGY DEPARTMENT WITHIN ONE WEEK IF SPECIATION AND SENSITIVITIES ARE REQUIRED. Performed at [facility], [place] [address], [place], [place] [address]    Report Status 01/21/2018 FINAL  Final  Blood Culture ID Panel (Reflexed)     Status: Abnormal   Collection Time: 01/18/18  2:58 PM  Result Value Ref Range Status   Enterococcus species NOT DETECTED NOT DETECTED Final   Listeria monocytogenes NOT DETECTED NOT DETECTED Final   Staphylococcus species DETECTED (A) NOT DETECTED Final    Comment: Methicillin (oxacillin) susceptible coagulase negative staphylococcus. Possible blood  culture contaminant (unless isolated from more than one blood culture draw or clinical case suggests pathogenicity). No antibiotic treatment is indicated for blood  culture contaminants. CRITICAL RESULT CALLED TO, READ BACK BY AND VERIFIED WITH: A MEYER PHARMD 01/19/18 1855 JDW    Staphylococcus aureus (BCID) NOT DETECTED NOT DETECTED Final   Methicillin resistance NOT DETECTED NOT DETECTED Final   Streptococcus species NOT DETECTED NOT DETECTED Final   Streptococcus agalactiae NOT DETECTED NOT DETECTED Final   Streptococcus pneumoniae NOT DETECTED NOT DETECTED Final   Streptococcus pyogenes NOT DETECTED NOT DETECTED Final   Acinetobacter baumannii NOT DETECTED NOT DETECTED Final   Enterobacteriaceae species NOT  DETECTED NOT DETECTED Final   Enterobacter cloacae complex NOT DETECTED NOT DETECTED Final   Escherichia coli NOT DETECTED NOT DETECTED Final   Klebsiella oxytoca NOT DETECTED NOT DETECTED Final   Klebsiella pneumoniae NOT DETECTED NOT DETECTED Final   Proteus species NOT DETECTED NOT DETECTED Final   Serratia marcescens NOT DETECTED NOT DETECTED Final   Haemophilus influenzae NOT DETECTED NOT DETECTED Final   Neisseria meningitidis NOT DETECTED NOT DETECTED Final   Pseudomonas aeruginosa NOT DETECTED NOT DETECTED Final   Candida albicans NOT DETECTED NOT DETECTED Final   Candida glabrata NOT DETECTED NOT DETECTED Final   Candida krusei NOT DETECTED NOT DETECTED Final   Candida parapsilosis NOT DETECTED NOT DETECTED Final   Candida tropicalis NOT DETECTED NOT DETECTED Final    Comment: Performed at [facility], [place] [address], [place], [place] [address]  Blood culture (routine x 2)     Status: None (Preliminary result)   Collection Time: 01/18/18  3:50 PM  Result Value Ref Range Status   Specimen Description BLOOD LEFT HAND  Final   Special Requests   Final    BOTTLES DRAWN AEROBIC AND ANAEROBIC Blood Culture results may not be optimal due to an inadequate volume of  blood received in culture bottles   Culture   Final    NO GROWTH 4 DAYS Performed at [facility], [place] [address] [place], [place] [address]    Report Status PENDING  Incomplete  MRSA PCR Screening     Status: None   Collection Time: 01/18/18  6:33 PM  Result Value Ref Range Status   MRSA by PCR NEGATIVE NEGATIVE Final    Comment:        The GeneXpert MRSA Assay (FDA approved for NASAL specimens only), is one component of a comprehensive MRSA colonization surveillance program. It is not intended to diagnose MRSA infection nor to guide or monitor treatment for MRSA infections. Performed at [facility], [place] [address], [place], [place] [address]   Urine culture     Status: None   Collection Time: 01/18/18  9:07 PM  Result Value Ref Range Status   Specimen Description URINE, CLEAN CATCH  Final   Special Requests NONE  Final   Culture   Final    NO GROWTH Performed at [facility], [place] [address] [place],  [address]    Report Status 01/20/2018 FINAL  Final     Scheduled Meds: . atorvastatin  80 mg Oral QHS  . feeding supplement (ENSURE ENLIVE)  237 mL Oral BID BM  . feeding supplement (GLUCERNA SHAKE)  237 mL Oral TID BM  . fluconazole  100 mg Oral Daily  . FLUoxetine  60 mg Oral QHS  . insulin aspart  0-15 Units Subcutaneous TID WC  . insulin aspart  0-5 Units Subcutaneous QHS  . insulin detemir  10 Units Subcutaneous Daily  . ipratropium-albuterol  3 mL Nebulization TID  . lidocaine  1 patch Transdermal Q24H  . methylPREDNISolone (SOLU-MEDROL) injection  60 mg Intravenous Q12H  . metoprolol tartrate  25 mg Oral BID  . nystatin  5 mL Oral QID  . senna-docusate  1 tablet Oral BID  . simethicone  80 mg Oral Once  . sodium chloride flush  3 mL Intravenous Q12H   Continuous Infusions: . heparin 1,400 Units/hr (01/24/18 0331)     LOS: 6 days   Cherene Altes, MD [facility] Office  [phone] Pager - Text  Page per  Shea Evans  If 7PM-7AM, please contact night-coverage per Amion 01/24/2018, 9:16 AM

## 2018-01-24 NOTE — Assessment & Plan Note (Signed)
Pt denies rue past Dx of COPD... told asthma in past.No history of smoking. Not improving after initial treatment with antibiotics or prednisone... eval with CXR in office.  May need to broaden antibiotics and repeat steroid course

## 2018-01-24 NOTE — Progress Notes (Signed)
ANTICOAGULATION CONSULT NOTE - Initial Consult  Pharmacy Consult for Lovenox Indication: atrial fibrillation and pulmonary embolus (dx 12/4)  Allergies  Allergen Reactions  . Sulfacetamide Sodium Swelling    throat swelling  . Latex Itching    Severe contact dermatitis  . Adhesive [Tape] Other (See Comments)    Tears skin-- "No tape of any kind, they all tear skin right off"    Patient Measurements: Height: 6' (182.9 cm) Weight: 177 lb 0.5 oz (80.3 kg) IBW/kg (Calculated) : 77.6 Heparin Dosing Weight: 80 kg  Vital Signs: Temp: 98.5 F (36.9 C) (12/16 0804) Temp Source: Oral (12/16 0804) BP: 101/78 (12/16 0804) Pulse Rate: 91 (12/16 0804)  Labs: Recent Labs    01/22/18 0246 01/23/18 0606 01/23/18 1511 01/24/18 0225  HGB  --   --   --  12.2*  HCT  --   --   --  38.8*  PLT  --   --   --  190  LABPROT 25.7* 18.5*  --  18.6*  INR 2.38 1.56  --  1.57  HEPARINUNFRC  --   --  0.43 0.42  CREATININE  --   --   --  1.29*    Estimated Creatinine Clearance: 53.5 mL/min (A) (by C-G formula based on SCr of 1.29 mg/dL (H)).   Medical History: Past Medical History:  Diagnosis Date  . Arthritis    hands   . Asthma   . Chronic atrial fibrillation    a.fib on warfarin- Dr. Percival Spanish follows  . Diverticulosis of colon (without mention of hemorrhage)   . ED (erectile dysfunction)   . Fibromyalgia   . GERD (gastroesophageal reflux disease)   . Hard of hearing    wears hearing aids  . Headache   . History of blood clots    behind right knee and then went into left lung 79yrs ago  . History of colon polyps   . History of kidney stones   . Hx of diabetes mellitus    "no longer diabetic since gastric bypass" - no longer taking metformin"  in 2 months "problems with low blood sugar now"  . Hx of gout    but doesn't take any meds  . Inflammation of colonic mucosa    recent admission and release from [facility] 02-28-15-remains on oral antibiotic  . IPF (idiopathic pulmonary  fibrosis) ([place])   . Obesity   . Peripheral vascular disease ([place])   . Pneumonia    last time about 8yrs ago  . Prostate cancer ([place])   . Pulmonary embolism ([place])    over 10 yrs ago "many years ago"  . Restless legs syndrome (RLS)   . Skin cancer   . Unspecified asthma(493.90)    as a child  . Unspecified essential hypertension    hx of no longer on medication due to gastric bypass     Medications:  Medications Prior to Admission  Medication Sig Dispense Refill Last Dose  . acarbose (PRECOSE) 50 MG tablet Take 50 mg by mouth 3 (three) times daily with meals.   01/17/2018 at Unknown time  . atorvastatin (LIPITOR) 80 MG tablet Take 1 tablet (80 mg total) by mouth at bedtime. 90 tablet 3 01/17/2018 at Unknown time  . benzonatate (TESSALON) 200 MG capsule Take 1 capsule (200 mg total) by mouth 3 (three) times daily as needed for cough. 20 capsule 0 01/17/2018 at Unknown time  . chlorpheniramine-HYDROcodone (TUSSIONEX) 10-8 MG/5ML SUER Take 5 mLs by mouth every 12 (twelve)  hours as needed for cough. (Patient taking differently: Take 1.5 mLs by mouth every 12 (twelve) hours as needed for cough. ) 140 mL 0 Past Week at Unknown time  . FLUoxetine (PROZAC) 20 MG capsule TAKE 3 CAPSULES BY MOUTH EVERYDAY AT BEDTIME (Patient taking differently: Take 60 mg by mouth at bedtime. ) 270 capsule 1 01/17/2018 at Unknown time  . furosemide (LASIX) 20 MG tablet Take 1 tablet (20 mg total) by mouth every other day. (Patient taking differently: Take 20 mg by mouth daily. ) 180 tablet 1 01/17/2018 at Unknown time  . gabapentin (NEURONTIN) 300 MG capsule TAKE 1 CAPSULE BY MOUTH TWICE A DAY (Patient taking differently: Take 300 mg by mouth 2 (two) times daily. ) 180 capsule 1 01/17/2018 at Unknown time  . hydrocortisone 2.5 % cream Apply 1 application topically 2 (two) times daily as needed (itching).   2 Past Week at Unknown time  . hydroxypropyl methylcellulose / hypromellose (ISOPTO TEARS / GONIOVISC) 2.5 %  ophthalmic solution Place 1 drop into both eyes 2 (two) times daily as needed for dry eyes.   Past Week at Unknown time  . Omega-3 Fatty Acids (FISH OIL) 1000 MG CAPS Take 1,000 mg by mouth daily.   01/17/2018 at Unknown time  . omeprazole (PRILOSEC) 40 MG capsule TAKE 1 CAPSULE (40 MG TOTAL) BY MOUTH DAILY. (Patient taking differently: Take 40 mg by mouth at bedtime. ) 90 capsule 1 01/17/2018 at Unknown time  . predniSONE (DELTASONE) 10 MG tablet 4 tabs x 4 days, 3 tabs x 4 days; 2 tabs x 4 days; 1 tab x 4 days 40 tablet 0 01/17/2018 at Unknown time  . PRESCRIPTION MEDICATION Apply 1 g topically 4 (four) times daily as needed (pain). Baclofen/Gabapentin/Lidocaine Compund Cream   01/17/2018 at Unknown time  . primidone (MYSOLINE) 50 MG tablet 150 mg in the morning, 100 mg at night (Patient taking differently: Take 100-150 mg by mouth See admin instructions. 150 mg in the morning, 100 mg at night) 450 tablet 1 01/17/2018 at Unknown time  . ranitidine (ZANTAC) 300 MG tablet Take 300 mg by mouth at bedtime.   3 01/17/2018 at Unknown time  . sucralfate (CARAFATE) 1 g tablet Take 1 g by mouth 3 (three) times daily before meals.   6 01/18/2018 at Unknown time  . tamsulosin (FLOMAX) 0.4 MG CAPS capsule Take 0.4 mg by mouth at bedtime.    01/17/2018 at Unknown time  . tiZANidine (ZANAFLEX) 4 MG tablet Take 4 mg by mouth every 6 (six) hours as needed for muscle spasms.    Past Month at Unknown time  . vitamin E 400 UNIT capsule Take 400 Units by mouth daily.   01/17/2018 at Unknown time  . warfarin (COUMADIN) 2.5 MG tablet Take 2.5 mg by mouth daily. Take 2.5mg  SUN and THUR and 3.75mg  on all other days.   01/17/2018 at 2000  . zolpidem (AMBIEN CR) 12.5 MG CR tablet Take 12.5 mg by mouth at bedtime.   01/17/2018 at Unknown time  . ipratropium-albuterol (DUONEB) 0.5-2.5 (3) MG/3ML SOLN Take 3 mLs by nebulization every 6 (six) hours as needed. (Patient taking differently: Take 3 mLs by nebulization every 6 (six) hours as  needed (shortness of breath). ) 360 mL 0     Assessment: 77 yo M with recent admission 12/4 > 12/8 for mgmt of pulm disease.  Noted at that time to have new PE on CT scan.  Pt was on Coumadin with therapeutic INR at  that time but declined LMWH and was unable to afford Eliqus/Xarelto. Pharmacy has been asked to initiate heparin when INR <2 for recent PE and hx afib.  Dr. Thereasa Solo d/w with pulm and will proceed with lovenox for long term anticoagulation. CrCl~50, hgb 12.2, plt 190  Goal of Therapy:  Heparin level 1-2 Monitor platelets by anticoagulation protocol: Yes    Plan:   Dc heparin Lovenox 120mg  SQ q24 CBC q3day  Onnie Boer, PharmD, BCIDP, AAHIVP, CPP Infectious Disease Pharmacist 01/24/2018 9:43 AM

## 2018-01-24 NOTE — Progress Notes (Signed)
NAME:  [name], MRN:  [id number], DOB:  1941/01/28, LOS: 6 ADMISSION DATE:  01/18/2018, CONSULTATION DATE:  12/10 REFERRING MD:  Lorin Mercy, CHIEF COMPLAINT:  Acute on chronic hypoxic respiratory failure    Brief History      77 year old male patient who has a significant medical history as mentioned below recently  referred to [facility] pulmonary with new diagnosis of pulmonary fibrosis.  We had seen him in consult at [facility] long on 12/4 for abnormal CT findings as well as pulmonary emboli.  We felt that the pulmonary emboli were small, and there is pulmonary fibrosis.  The overwhelming issue driving his hypoxia.  He was ultimately discharged to home with plan for follow-up in our clinic, prednisone taper, and then referral to ILD clinic.  He was discharged home on supplemental oxygen and supposed to have been on a prednisone taper which he did not get.  The prednisone taper was started in our clinic on hospital follow-up today.  He had been doing quite poorly with marked work of breathing at home on 2 L increasing to 3 L without assistance.  The prednisone was started initially on 12/9.  He presented to the emergency room today on 12/10 after a fall.  He reported he had not tripped or anything but essentially had become quite dizzy resulting in the fall.  He was brought to the emergency room for further evaluation, found to have pulse oximetry in the 70s on 2 L nasal cannula, he was weak, fatigued, slow to respond.  Oxygen was titrated up ultimately up to NIPPV.  Pulmonary asked to assist with acute hypoxic respiratory failure  Results for [name], [name] (MRN [id number]) as of 01/24/2018 16:10  Ref. Range 01/13/2018 19:08 01/14/2018 10:48 01/17/2018 17:28  Anti Nuclear Antibody(ANA) Latest Ref Range: Negative  Negative    ANA Ab, IFA Unknown Positive (A)    CCP Antibodies IgG/IgA Latest Ref Range: 0 - 19 units 8 8   ds DNA Ab Latest Ref Range: 0 - 9 IU/mL  <1   RA Latex Turbid. Latest Ref  Range: <14 IU/mL   14 (H)  ENA SM Ab Ser-aCnc Latest Ref Range: 0.0 - 0.9 AI  <0.2   Homogeneous Pattern Unknown 1:160 (H)    NOTE: Unknown Comment    Ribonucleic Protein Latest Ref Range: 0.0 - 0.9 AI  0.4   SSA (Ro) (ENA) Antibody, IgG Latest Ref Range: 0.0 - 0.9 AI  <0.2   SSB (La) (ENA) Antibody, IgG Latest Ref Range: 0.0 - 0.9 AI  <0.2   Scleroderma (Scl-70) (ENA) Antibody, IgG Latest Ref Range: 0.0 - 0.9 AI  <0.2   Results for [name], [name] (MRN [id number]) as of 01/24/2018 16:10  Ref. Range 01/14/2018 04:40  Sed Rate Latest Ref Range: 0 - 16 mm/hr 22 (H)    Past Medical History  Prostate cancer (currently on coumadin but has been coumadin failure), PE, possible COPD and newly identified pulmonary fibrosis.   Significant Hospital Events   01/18/2018 - admit 01/20/18 -   Some improvement in O2 needs, resp status on steroids and also after diuretics (negative balance) New A fib   Consults:  Pulmonary  Procedures:    Significant Diagnostic Tests:  CT imaging of C-spine, maxillofacial and head.  All negative for acute fracture or trauma  Micro Data:   Antimicrobials:    Interim history/subjective:   12/16 - wife at bedside. On 2-3L [place]. Worked with PT. Wife says that he he was  saved from sepsis in 2011 by [place].    Objective   Blood pressure 119/76, pulse 84, temperature 98.5 F (36.9 C), resp. rate (!) 24, height 6' (1.829 m), weight 80.3 kg, SpO2 98 %.        Intake/Output Summary (Last 24 hours) at 01/24/2018 1556 Last data filed at 01/24/2018 0600 Gross per 24 hour  Intake 207.76 ml  Output 475 ml  Net -267.24 ml   Filed Weights   01/19/18 1015  Weight: 80.3 kg  General Appearance:  Emaciated +. Sits in chair Head:  Normocephalic, without obvious abnormality, atraumatic Eyes:  PERRL - yes, conjunctiva/corneas - clear     Ears:  Normal external ear canals, both ears Nose:  G tube - no but has [place] o2 Throat:  ETT TUBE - no , OG tube - no Neck:  Supple,   No enlargement/tenderness/nodules Lungs:  Coarse. Some scattered crackles Heart:  S1 and S2 normal, no murmur, CVP - no.  Pressors - no Abdomen:  Soft, no masses, no organomegaly Genitalia / Rectal:  Not done Extremities:  Extremities- lean and emaciated Skin:  ntact in exposed areas . Neurologic:  Sedation - none -> RASS - +1 . Moves all 4s - yes. CAM-ICU - unable to assess due to profound deconditioning     LABS    PULMONARY Recent Labs  Lab 01/18/18 1056  PHART 7.428  PCO2ART 32.1  PO2ART 118.0*  HCO3 21.2  TCO2 22  O2SAT 99.0    CBC Recent Labs  Lab 01/18/18 1045 01/24/18 0225  HGB 12.2* 12.2*  HCT 39.8 38.8*  WBC 21.7* 26.9*  PLT 200 190    COAGULATION Recent Labs  Lab 01/20/18 0246 01/21/18 0229 01/22/18 0246 01/23/18 0606 01/24/18 0225  INR 4.26* 2.93 2.38 1.56 1.57    CARDIAC  No results for input(s): TROPONINI in the last 168 hours. No results for input(s): PROBNP in the last 168 hours.   CHEMISTRY Recent Labs  Lab 01/18/18 1045 01/19/18 0939 01/20/18 0246 01/21/18 0229 01/24/18 0225  NA 135 137 135 136 134*  K 4.5 4.4 4.9 4.6 4.9  CL 99 100 99 98 99  CO2 22 22 24 26 23   GLUCOSE 230* 277* 323* 235* 192*  BUN 28* 43* 54* 51* 34*  CREATININE 1.53* 1.71* 1.71* 1.52* 1.29*  CALCIUM 8.6* 8.3* 8.1* 8.1* 7.9*  MG 1.9  --   --   --  2.3  PHOS  --   --   --   --  3.2   Estimated Creatinine Clearance: 53.5 mL/min (A) (by C-G formula based on SCr of 1.29 mg/dL (H)).   LIVER Recent Labs  Lab 01/18/18 1045  01/20/18 0246 01/21/18 0229 01/22/18 0246 01/23/18 0606 01/24/18 0225  AST 28  --   --   --   --   --   --   ALT 19  --   --   --   --   --   --   ALKPHOS 86  --   --   --   --   --   --   BILITOT 0.8  --   --   --   --   --   --   PROT 6.5  --   --   --   --   --   --   ALBUMIN 2.7*  --   --   --   --   --  2.2*  INR 2.66   < >  4.26* 2.93 2.38 1.56 1.57   < > = values in this interval not displayed.     INFECTIOUS Recent  Labs  Lab 01/18/18 1507  LATICACIDVEN 1.10     ENDOCRINE CBG (last 3)  Recent Labs    01/23/18 2055 01/24/18 0803 01/24/18 1209  GLUCAP 130* 184* 235*        CT chest angio 01/12/18 - personally visualzied  Resolved Hospital Problem list     Assessment & Plan:  Acute on chronic hypoxemic resp failure  - persistent . Needing 2-3L [place]  - recent 12//19 - small PE - underlying ILD NOS - serology negative - noted 01/12/18 (ESR 22 on 01/14/18) - associated esophageal abnormality on CT 01/12/18   Plan Continue IV solumedrol Check HRCT Migh need gi evaluation for esophagus    Best practice:  Diet: heart healthy diet, no intubation pain/Anxiety/Delirium protocol (if indicated): Not applicable VAP protocol (if indicated): Not applicable DVT prophylaxis: IV heparin GI prophylaxis: Not applicable Glucose control: Sliding scale insulin Mobility: Bedrest Code Status: DO NOT RESUSCITATE Family Communication: wife 01/24/18 Disposition:2W per TRiad      SIGNATURE    Dr. Brand Males, M.D., F.C.C.P,  Pulmonary and Critical Care Medicine Staff Physician, [place] Director - Interstitial Lung Disease  Program  Pulmonary [place] [facility] [place], [place], [address]  Pager: [phone], If no answer or between  15:00h - 7:00h: call [phone] Telephone: [phone]  4:52 PM 01/24/2018

## 2018-01-24 NOTE — Consult Note (Signed)
Physical Medicine and Rehabilitation Consult   Reason for Consult: Debility Referring Physician: Dr. Duanne Limerick.    HPI: [name] is a 77 y.o. male with history of fibromyalgia, gastric bypass, prostate cancer, PVD, A fib-chronic coumadin?,PE, 5-6 week history of weight loss, fatigue with weakness, SOB who was recently admitted 12/4-12/7/19 for work up. History taken from chart review and wife. He was found to have small PE and was  treated for acute exacerbation of pulmonary fibrosis and discharged to home on oxygen and steroids. He was readmitted on 12/10 with fall, increase WOB, weakness and poor po intake. CT head reviewed, unremarkable for acute intracranial process. Patient noted to be hypoxic requiring BIPAP.  He had not been getting his steroids since discharge from hospital. IV steroids added for acute on chronic v/s chronic hypoxic failure in setting of advanced pulmonary fibrosis and PCCM felt that recent small PE not cause of hypoxia. To be referred to ILD clinic and palliative care consulted to help with [place]. Therapy evaluation done and patient's activity limited by DOE with easy fatigability. CIR recommended for follow up therapy. Per wife; patient had R-TKR 04/2017 with "nothing but problems". Has poor ROM right knee as well as problems with left knee due to prior TKR. He went back to work part time for a few weeks in October but had to stop due to lung issues.    Review of Systems  Constitutional: Negative for chills and fever.  HENT: Negative for hearing loss and tinnitus.   Eyes: Negative for blurred vision and double vision.  Respiratory: Positive for shortness of breath.   Cardiovascular: Positive for chest pain. Negative for leg swelling.  Gastrointestinal: Negative for abdominal pain and heartburn.  Musculoskeletal: Positive for falls and joint pain.  Skin: Negative for rash.  Neurological: Positive for dizziness and weakness.  Psychiatric/Behavioral: Positive  for memory loss. The patient is nervous/anxious.   All other systems reviewed and are negative.     Past Medical History:  Diagnosis Date  . Arthritis    hands   . Asthma   . Chronic atrial fibrillation    a.fib on warfarin- Dr. Percival Spanish follows  . Diverticulosis of colon (without mention of hemorrhage)   . ED (erectile dysfunction)   . Fibromyalgia   . GERD (gastroesophageal reflux disease)   . Hard of hearing    wears hearing aids  . Headache   . History of blood clots    behind right knee and then went into left lung 14yrs ago  . History of colon polyps   . History of kidney stones   . Hx of diabetes mellitus    "no longer diabetic since gastric bypass" - no longer taking metformin"  in 2 months "problems with low blood sugar now"  . Hx of gout    but doesn't take any meds  . Inflammation of colonic mucosa    recent admission and release from [facility] 02-28-15-remains on oral antibiotic  . IPF (idiopathic pulmonary fibrosis) ([place])   . Obesity   . Peripheral vascular disease ([place])   . Pneumonia    last time about 27yrs ago  . Prostate cancer ([place])   . Pulmonary embolism ([place])    over 10 yrs ago "many years ago"  . Restless legs syndrome (RLS)   . Skin cancer   . Unspecified asthma(493.90)    as a child  . Unspecified essential hypertension    hx of no longer on medication due  to gastric bypass     Past Surgical History:  Procedure Laterality Date  . ANTERIOR CERVICAL DECOMP/DISCECTOMY FUSION N/A 05/09/2013   Procedure: ANTERIOR CERVICAL DECOMPRESSION/DISCECTOMY FUSION CERVICAL THREE-FOUR,CERVICAL FOUR-FIVE,CERVICAL FIVE-SIX;  Surgeon: Floyce Stakes, MD;  Location: [facility];  Service: Neurosurgery;  Laterality: N/A;  . ARTERY REPAIR     Left forearm  . BACK SURGERY     x 3  . BREATH TEK H PYLORI  01/08/2011   Procedure: BREATH TEK H PYLORI;  Surgeon: Pedro Earls, MD;  Location: [facility];  Service: General;  Laterality: N/A;  . CARDIOVERSION     x 2  attempts-unsuccessful.  [name] CATARACT EXTRACTION, BILATERAL    . COLONOSCOPY    . CYSTOSCOPY    . ESOPHAGOGASTRODUODENOSCOPY (EGD) WITH PROPOFOL N/A 09/21/2014   Procedure: ESOPHAGOGASTRODUODENOSCOPY (EGD) WITH PROPOFOL;  Surgeon: Manya Silvas, MD;  Location: [facility] ENDOSCOPY;  Service: Endoscopy;  Laterality: N/A;  . EYE SURGERY     cataract bil  . FOOT SURGERY     Left foot   . GASTRIC ROUX-EN-Y  08/11/2011   Procedure: LAPAROSCOPIC ROUX-EN-Y GASTRIC BYPASS WITH UPPER ENDOSCOPY;  Surgeon: Pedro Earls, MD;  Location: [facility] ORS;  Service: General;  Laterality: N/A;  . HAND SURGERY     LEFT  . injections in back     x 18  . JOINT REPLACEMENT    . KNEE SURGERY Left    x 4  . KNEE SURGERY Left    arthroscopy  . LAPAROSCOPIC INTERNAL HERNIA REPAIR N/A 03/08/2015   Procedure: LAPAROSCOPIC INTERNAL HERNIA REPAIR ;  Surgeon: [name], MD;  Location: [facility] ORS;  Service: General;  Laterality: N/A;  . LEG SURGERY     FOR NECROTIZING FASCITIS L LEG AND GROIN  . PANNICULECTOMY N/A 08/25/2016   Procedure: PANNICULECTOMY;  Surgeon: [name], MD;  Location: [facility]S;  Service: General;  Laterality: N/A;  . PENILE PROSTHESIS IMPLANT N/A 07/02/2014   Procedure: PENILE PROTHESIS INFLATABLE 3 PIECE (COLOPLAST) SCROTAL APPROACH;  Surgeon: Kathie Rhodes, MD;  Location: [facility];  Service: Urology;  Laterality: N/A;  . PENILE PROSTHESIS IMPLANT N/A 11/23/2014   Procedure: EXPLORATION AND REVISION OF PENILE PROSTHESIS;  Surgeon: Kathie Rhodes, MD;  Location: [facility];  Service: Urology;  Laterality: N/A;  . PICC INSERTION W/OUT PORT/PUMP    . REPLACEMENT TOTAL KNEE Left    x 2  . SHOULDER ARTHROSCOPY    . SHOULDER SURGERY Left 2014  . TONSILLECTOMY    . TOTAL KNEE ARTHROPLASTY Right 04/27/2017  . TOTAL KNEE ARTHROPLASTY Right 04/27/2017   Procedure: TOTAL KNEE ARTHROPLASTY;  Surgeon: Melrose Nakayama, MD;  Location: [place];  Service: Orthopedics;  Laterality: Right;  . TOTAL SHOULDER ARTHROPLASTY Left  01/26/2013   Procedure: TOTAL SHOULDER ARTHROPLASTY;  Surgeon: Nita Sells, MD;  Location: [place];  Service: Orthopedics;  Laterality: Left;  Left total shoulder arthroplasty    Family History  Problem Relation Age of Onset  . Uterine cancer Mother        mets  . Breast cancer Mother   . Cancer Mother        cervical  . Emphysema Father   . Alzheimer's disease Sister   . Prostate cancer Brother   . Heart attack Brother   . Colon cancer Brother 8    Social History:  Married. Independent without AD till 6 weeks ago?  He reports that he has never smoked. He has never used smokeless tobacco. He reports that  he does not drink alcohol or use drugs.    Allergies  Allergen Reactions  . Sulfacetamide Sodium Swelling    throat swelling  . Latex Itching    Severe contact dermatitis  . Adhesive [Tape] Other (See Comments)    Tears skin-- "No tape of any kind, they all tear skin right off"    Medications Prior to Admission  Medication Sig Dispense Refill  . acarbose (PRECOSE) 50 MG tablet Take 50 mg by mouth 3 (three) times daily with meals.    [name] atorvastatin (LIPITOR) 80 MG tablet Take 1 tablet (80 mg total) by mouth at bedtime. 90 tablet 3  . benzonatate (TESSALON) 200 MG capsule Take 1 capsule (200 mg total) by mouth 3 (three) times daily as needed for cough. 20 capsule 0  . chlorpheniramine-HYDROcodone (TUSSIONEX) 10-8 MG/5ML SUER Take 5 mLs by mouth every 12 (twelve) hours as needed for cough. (Patient taking differently: Take 1.5 mLs by mouth every 12 (twelve) hours as needed for cough. ) 140 mL 0  . FLUoxetine (PROZAC) 20 MG capsule TAKE 3 CAPSULES BY MOUTH EVERYDAY AT BEDTIME (Patient taking differently: Take 60 mg by mouth at bedtime. ) 270 capsule 1  . furosemide (LASIX) 20 MG tablet Take 1 tablet (20 mg total) by mouth every other day. (Patient taking differently: Take 20 mg by mouth daily. ) 180 tablet 1  . gabapentin (NEURONTIN) 300 MG capsule TAKE 1 CAPSULE BY  MOUTH TWICE A DAY (Patient taking differently: Take 300 mg by mouth 2 (two) times daily. ) 180 capsule 1  . hydrocortisone 2.5 % cream Apply 1 application topically 2 (two) times daily as needed (itching).   2  . hydroxypropyl methylcellulose / hypromellose (ISOPTO TEARS / GONIOVISC) 2.5 % ophthalmic solution Place 1 drop into both eyes 2 (two) times daily as needed for dry eyes.    . Omega-3 Fatty Acids (FISH OIL) 1000 MG CAPS Take 1,000 mg by mouth daily.    [name] omeprazole (PRILOSEC) 40 MG capsule TAKE 1 CAPSULE (40 MG TOTAL) BY MOUTH DAILY. (Patient taking differently: Take 40 mg by mouth at bedtime. ) 90 capsule 1  . predniSONE (DELTASONE) 10 MG tablet 4 tabs x 4 days, 3 tabs x 4 days; 2 tabs x 4 days; 1 tab x 4 days 40 tablet 0  . PRESCRIPTION MEDICATION Apply 1 g topically 4 (four) times daily as needed (pain). Baclofen/Gabapentin/Lidocaine Compund Cream    . primidone (MYSOLINE) 50 MG tablet 150 mg in the morning, 100 mg at night (Patient taking differently: Take 100-150 mg by mouth See admin instructions. 150 mg in the morning, 100 mg at night) 450 tablet 1  . ranitidine (ZANTAC) 300 MG tablet Take 300 mg by mouth at bedtime.   3  . sucralfate (CARAFATE) 1 g tablet Take 1 g by mouth 3 (three) times daily before meals.   6  . tamsulosin (FLOMAX) 0.4 MG CAPS capsule Take 0.4 mg by mouth at bedtime.     [name] tiZANidine (ZANAFLEX) 4 MG tablet Take 4 mg by mouth every 6 (six) hours as needed for muscle spasms.     . vitamin E 400 UNIT capsule Take 400 Units by mouth daily.    [name] warfarin (COUMADIN) 2.5 MG tablet Take 2.5 mg by mouth daily. Take 2.5mg  SUN and THUR and 3.75mg  on all other days.    [name] zolpidem (AMBIEN CR) 12.5 MG CR tablet Take 12.5 mg by mouth at bedtime.    [name] ipratropium-albuterol (DUONEB) 0.5-2.5 (  3) MG/3ML SOLN Take 3 mLs by nebulization every 6 (six) hours as needed. (Patient taking differently: Take 3 mLs by nebulization every 6 (six) hours as needed (shortness of breath). ) 360 mL 0      Home: Home Living Family/patient expects to be discharged to:: Private residence Living Arrangements: Spouse/significant other Available Help at Discharge: Family Type of Home: House Home Access: Stairs to enter Technical brewer of Steps: 2 Entrance Stairs-Rails: Left Home Layout: One level Bathroom Shower/Tub: Chiropodist: Standard Home Equipment: Shower seat - built in, Hand held shower head  Functional History: Prior Function Level of Independence: Independent Comments: works driving.  Functional Status:  Mobility: Bed Mobility Overal bed mobility: Modified Independent Transfers Overall transfer level: Needs assistance Equipment used: None Transfers: Sit to/from Stand Sit to Stand: Min guard General transfer comment: min guard for sit to stand x5 this visit. DOE 2/4 with standing, did not desat on 2L Ambulation/Gait Ambulation/Gait assistance: Min assist, Min guard Gait Distance (Feet): 10 Feet Assistive device: None Gait Pattern/deviations: Step-to pattern, Step-through pattern General Gait Details: pt with instability and becoming DOE 3/4 with short distance walking in room, use of UE support on fruniture as he walked around bed. Will trial rollator next visit with chair follow Gait velocity: decreased    ADL:    Cognition: Cognition Overall Cognitive Status: Within Functional Limits for tasks assessed Cognition Arousal/Alertness: Awake/alert Behavior During Therapy: WFL for tasks assessed/performed Overall Cognitive Status: Within Functional Limits for tasks assessed   Blood pressure 101/78, pulse 91, temperature 98.5 F (36.9 C), temperature source Oral, resp. rate (!) 24, height 6' (1.829 m), weight 80.3 kg, SpO2 98 %. Physical Exam  Nursing note and vitals reviewed. Constitutional: He appears well-developed and well-nourished. He appears cachectic. No distress. Nasal cannula in place.  HENT:  Head: Normocephalic and  atraumatic.  Eyes: EOM are normal. Right eye exhibits no discharge. Left eye exhibits no discharge.  Neck: Normal range of motion. Neck supple.  Cardiovascular:  Irregularly irregular  Respiratory: Breath sounds normal.  Labored breathing +[place]  GI: Soft. Bowel sounds are normal.  Musculoskeletal:     Comments: Left shoulder-- some discomfort and has decreased. Well healed old TKR incisions bilateral knee. Left knee with extension lag and discomfort with attempts at ROM.   Neurological: He is alert.  Anxious appearing and looks to wife to answer basic questions.  He was able to follow simple motor commands.  Motor: 4/5 grossly throughout  Skin: He is not diaphoretic.  Facial abrasions  Psychiatric: He has a normal mood and affect. His behavior is normal.    Results for orders placed or performed during the hospital encounter of 01/18/18 (from the past 24 hour(s))  Glucose, capillary     Status: Abnormal   Collection Time: 01/23/18 12:05 PM  Result Value Ref Range   Glucose-Capillary 181 (H) 70 - 99 mg/dL  Heparin level (unfractionated)     Status: None   Collection Time: 01/23/18  3:11 PM  Result Value Ref Range   Heparin Unfractionated 0.43 0.30 - 0.70 IU/mL  Glucose, capillary     Status: None   Collection Time: 01/23/18  4:44 PM  Result Value Ref Range   Glucose-Capillary 93 70 - 99 mg/dL  Glucose, capillary     Status: Abnormal   Collection Time: 01/23/18  5:57 PM  Result Value Ref Range   Glucose-Capillary 128 (H) 70 - 99 mg/dL  Glucose, capillary     Status: Abnormal  Collection Time: 01/23/18  8:55 PM  Result Value Ref Range   Glucose-Capillary 130 (H) 70 - 99 mg/dL  Protime-INR     Status: Abnormal   Collection Time: 01/24/18  2:25 AM  Result Value Ref Range   Prothrombin Time 18.6 (H) 11.4 - 15.2 seconds   INR 1.57   Heparin level (unfractionated)     Status: None   Collection Time: 01/24/18  2:25 AM  Result Value Ref Range   Heparin Unfractionated 0.42 0.30 -  0.70 IU/mL  Renal function panel     Status: Abnormal   Collection Time: 01/24/18  2:25 AM  Result Value Ref Range   Sodium 134 (L) 135 - 145 mmol/L   Potassium 4.9 3.5 - 5.1 mmol/L   Chloride 99 98 - 111 mmol/L   CO2 23 22 - 32 mmol/L   Glucose, Bld 192 (H) 70 - 99 mg/dL   BUN 34 (H) 8 - 23 mg/dL   Creatinine, Ser 1.29 (H) 0.61 - 1.24 mg/dL   Calcium 7.9 (L) 8.9 - 10.3 mg/dL   Phosphorus 3.2 2.5 - 4.6 mg/dL   Albumin 2.2 (L) 3.5 - 5.0 g/dL   GFR calc non Af Amer 53 (L) >60 mL/min   GFR calc Af Amer >60 >60 mL/min   Anion gap 12 5 - 15  CBC with Differential/Platelet     Status: Abnormal   Collection Time: 01/24/18  2:25 AM  Result Value Ref Range   WBC 26.9 (H) 4.0 - 10.5 K/uL   RBC 4.28 4.22 - 5.81 MIL/uL   Hemoglobin 12.2 (L) 13.0 - 17.0 g/dL   HCT 38.8 (L) 39.0 - 52.0 %   MCV 90.7 80.0 - 100.0 fL   MCH 28.5 26.0 - 34.0 pg   MCHC 31.4 30.0 - 36.0 g/dL   RDW 13.2 11.5 - 15.5 %   Platelets 190 150 - 400 K/uL   nRBC 0.0 0.0 - 0.2 %   Neutrophils Relative % 93 %   Neutro Abs 24.8 (H) 1.7 - 7.7 K/uL   Lymphocytes Relative 2 %   Lymphs Abs 0.6 (L) 0.7 - 4.0 K/uL   Monocytes Relative 4 %   Monocytes Absolute 1.2 (H) 0.1 - 1.0 K/uL   Eosinophils Relative 0 %   Eosinophils Absolute 0.0 0.0 - 0.5 K/uL   Basophils Relative 0 %   Basophils Absolute 0.0 0.0 - 0.1 K/uL   WBC Morphology SMUDGE CELLS    Immature Granulocytes 1 %   Abs Immature Granulocytes 0.26 (H) 0.00 - 0.07 K/uL   Burr Cells PRESENT    Polychromasia PRESENT   Magnesium     Status: None   Collection Time: 01/24/18  2:25 AM  Result Value Ref Range   Magnesium 2.3 1.7 - 2.4 mg/dL  Glucose, capillary     Status: Abnormal   Collection Time: 01/24/18  8:03 AM  Result Value Ref Range   Glucose-Capillary 184 (H) 70 - 99 mg/dL   *Note: Due to a large number of results and/or encounters for the requested time period, some results have not been displayed. A complete set of results can be found in Results Review.    No results found.  Assessment/Plan: Diagnosis: Debility Labs and images (see above) independently reviewed.  Records reviewed and summated above.  1. Does the need for close, 24 hr/day medical supervision in concert with the patient's rehab needs make it unreasonable for this patient to be served in a less intensive setting? Potentially  2.  Co-Morbidities requiring supervision/potential complications: fibromyalgia (Biofeedback training with therapies to help reduce reliance on opiate pain medications, monitor pain control during therapies, and sedation at rest and titrate to maximum efficacy to ensure participation and gains in therapies), gastric bypass, prostate cancer, PVD (cont meds), A fib (cont meds, monitor HR with increased activity),PE, leukocytosis (repeat labs, cont to monitor for signs and symptoms of infection, further workup if indicated) 3. Due to safety, disease management and patient education, does the patient require 24 hr/day rehab nursing? Potentially 4. Does the patient require coordinated care of a physician, rehab nurse, PT (1-2 hrs/day, 5 days/week) and OT (1-2 hrs/day, 5 days/week) to address physical and functional deficits in the context of the above medical diagnosis(es)? Potentially Addressing deficits in the following areas: balance, endurance, locomotion, strength, transferring, bathing, dressing, toileting and psychosocial support 5. Can the patient actively participate in an intensive therapy program of at least 3 hrs of therapy per day at least 5 days per week? No 6. The potential for patient to make measurable gains while on inpatient rehab is good and fair 7. Anticipated functional outcomes upon discharge from inpatient rehab are supervision and min assist  with PT, supervision and min assist with OT, n/a with SLP. 8. Estimated rehab length of stay to reach the above functional goals is: 12-15 days. 9. Anticipated D/C setting: Home 10. Anticipated post D/C  treatments: HH therapy and Home excercise program 11. Overall Rehab/Functional Prognosis: good  RECOMMENDATIONS: This patient's condition is appropriate for continued rehabilitative care in the following setting: Patient with dyspnea on minimal bed level activity, cannot tolerate CIR at present.  Fatigue limiting factor.  Will cont to follow if/when able to tolerate 3 hours of therapy/day, otherwise recommend SNF for less intense therapies. Patient has agreed to participate in recommended program. Potentially Note that insurance prior authorization may be required for reimbursement for recommended care.  Comment: Rehab Admissions Coordinator to follow up.   I have personally performed a face to face diagnostic evaluation, including, but not limited to relevant history and physical exam findings, of this patient and developed relevant assessment and plan.  Additionally, I have reviewed and concur with the physician assistant's documentation above.   Delice Lesch, MD, ABPMR Bary Leriche, PA-C 01/24/2018

## 2018-01-24 NOTE — Progress Notes (Signed)
Physical Therapy Treatment Patient Details Name: [name] MRN: [id number] DOB: 1940/11/16 Today's Date: 01/24/2018    History of Present Illness 77 y.o. male admitted with respiratory failure requiring bipap, though to be due to idiopathic pulmonary fibrosis. PMH includes but not limited to: PE, peripheral vascular disease, HTN, asthma, prostate CA, pulmonary fibrosis, DM, GERD, chronic A Fib, Arthritis, B TKA, L TSA, back surgery.    PT Comments    Pt received in bed, supine. Wife reports yesterday was rough, the pt generally more weak. The pt reports worse stiffness in knees, acute exacerbation of bilat knee OA, now more stiff from decreased activity. Warm-up in bed with A/ROM bilt knees, then progressive transfers at EOB, 2 set. AMB in room, pt difficulty with nasal breathing, but he is able to do so every 4th breath when cued, which is sufficient to maintain SpO2>92% ABM 9ft on 3L. Mild desat AMB on 2L. Pt tolerates session well in general, requires intermittent seated rest breaks. Legs still very weak, but balance mostly supervision level with RW.    Follow Up Recommendations  SNF(May be a good candidate for pulmonary rehab )     Equipment Recommendations       Recommendations for Other Services       Precautions / Restrictions Precautions Precautions: Fall Precaution Comments: Monitor Sats Restrictions Weight Bearing Restrictions: No    Mobility  Bed Mobility Overal bed mobility: Needs Assistance Bed Mobility: Supine to Sit     Supine to sit: Modified independent (Device/Increase time)        Transfers Overall transfer level: Needs assistance Equipment used: Rolling walker (2 wheeled) Transfers: Sit to/from Stand Sit to Stand: Min guard;From elevated surface(2x5, mild elevated surface, heavy effort required. )            Ambulation/Gait Ambulation/Gait assistance: Min guard Gait Distance (Feet): 34 Feet(2x42ft with seated rest between.  ) Assistive device: Rolling walker (2 wheeled)       General Gait Details: stable tachypnea throughout low 30s RR; VC for nasal breathing which is difficult, but pt able to perform every 4th inhalation Mercado nasal breathing(1x on 2L/min  terminal SpO2: 88%, then again on 3L Mercado SpO2 93%. )   Stairs             Wheelchair Mobility    Modified Rankin (Stroke Patients Only)       Balance Overall balance assessment: Mild deficits observed, not formally tested                                          Cognition Arousal/Alertness: Awake/alert Behavior During Therapy: WFL for tasks assessed/performed Overall Cognitive Status: Within Functional Limits for tasks assessed                                        Exercises General Exercises - Lower Extremity Short Arc Quad: AROM;Both;Supine;10 reps Long Arc Quad: 10 reps;AROM;Both;Seated Heel Slides: AROM;15 reps;Both;Supine    General Comments        Pertinent Vitals/Pain Pain Assessment: No/denies pain    Home Living                      Prior Function            PT Goals (current goals  can now be found in the care plan section) Acute Rehab PT Goals Patient Stated Goal: get stronger PT Goal Formulation: With patient/family Time For Goal Achievement: 02/05/18 Potential to Achieve Goals: Good Progress towards PT goals: Progressing toward goals    Frequency    Min 3X/week      PT Plan Current plan remains appropriate    Co-evaluation              AM-PAC PT "6 Clicks" Mobility   Outcome Measure  Help needed turning from your back to your side while in a flat bed without using bedrails?: None Help needed moving from lying on your back to sitting on the side of a flat bed without using bedrails?: None Help needed moving to and from a bed to a chair (including a wheelchair)?: A Little Help needed standing up from a chair using your arms (e.g., wheelchair or bedside  chair)?: A Little Help needed to walk in hospital room?: A Little Help needed climbing 3-5 steps with a railing? : A Lot 6 Click Score: 19    End of Session Equipment Utilized During Treatment: Gait belt;Oxygen Activity Tolerance: Patient limited by fatigue;Patient tolerated treatment well Patient left: in chair;with family/visitor present;with call bell/phone within reach Nurse Communication: Mobility status;Other (comment) PT Visit Diagnosis: Difficulty in walking, not elsewhere classified (R26.2)     Time: 7972-8206 PT Time Calculation (min) (ACUTE ONLY): 35 min  Charges:  $Therapeutic Activity: 23-37 mins                     9:15 PM, 01/24/18 [name], PT, DPT Physical Therapist - [place] [phone] (Office)      [name],[name] 01/24/2018, 9:12 PM

## 2018-01-24 NOTE — Progress Notes (Signed)
Palliative:  [name] is resting quietly in bed.  He will make an briefly keep eye contact, he appears weak and frail today.  Present today at bedside his wife, [name].  She tells me that [name] was worn out by his bath earlier today.  She also shares that he was evaluated by CIR for inpatient rehab.  [name] states that she feels inpatient rehab will provide what [name] needs to regain some strength.  She tells me that he is scheduled for interstitial lung disease clinic sometime during the first week of January.  We talked about his pain and bowel regimen.  Medications adjusted.  All questions answered, encouraged to call as needed.   25 minutes Jonathan Axe, NP Palliative Medicine Team [phone] Greater than 50% of this time was spent counseling and coordinating care related to the above assessment and plan.

## 2018-01-24 NOTE — Telephone Encounter (Signed)
Last office visit 01/05/18 with Dr. Einar Pheasant for COPD.  Last refilled 12/21/2017 for #30 with no refills.  Patient is currently in the hospital.  Ok to refill?

## 2018-01-24 NOTE — Evaluation (Signed)
Occupational Therapy Evaluation Patient Details Name: [name] MRN: [id number] DOB: 24-Dec-1940 Today's Date: 01/24/2018    History of Present Illness 77 y.o. male admitted with respiratory failure requiring bipap, though to be due to idiopathic pulmonary fibrosis. PMH includes but not limited to: PE, peripheral vascular disease, HTN, asthma, prostate CA, pulmonary fibrosis, DM, GERD, chronic A Fib, Arthritis, B TKA, L TSA, back surgery.   Clinical Impression   Pt with decline in function and safety with ADLs and ADL mobility with decreased strength, balance and endurance. Pt with pain in R side rib areas from falling PTA.  Pt participated as much as possible with OT, however fatigued very easily and required max A to complete selfcare sitting EOB. Pt would benefit from acute OT services to address impairments to maximize level of function and safety. Pt's wife present and very supportive. Pt/family would very much like CIR/more rehab after acute d/c to return to PLOF    Follow Up Recommendations  CIR    Equipment Recommendations  Other (comment)(TBD at next venue of care)    Recommendations for Other Services       Precautions / Restrictions Precautions Precautions: Fall Precaution Comments: Monitor Sats Restrictions Weight Bearing Restrictions: No      Mobility Bed Mobility Overal bed mobility: Needs Assistance Bed Mobility: Sit to Supine       Sit to supine: Min assist   General bed mobility comments: pt sittig EOB with wife assisting with bathing upon arrival. Pt required min A with LEs back onto bed  Transfers Overall transfer level: Needs assistance Equipment used: None Transfers: Sit to/from [facility] Sit to Stand: Min assist Stand pivot transfers: Min guard       General transfer comment: to [facility] and back to bed    Balance Overall balance assessment: Mild deficits observed, not formally tested                                          ADL either performed or assessed with clinical judgement   ADL Overall ADL's : Needs assistance/impaired Eating/Feeding: Set up;Supervision/ safety;Sitting   Grooming: Wash/dry hands;Wash/dry face   Upper Body Bathing: Moderate assistance   Lower Body Bathing: Maximal assistance   Upper Body Dressing : Moderate assistance   Lower Body Dressing: Maximal assistance   Toilet Transfer: Minimal assistance;Stand-pivot;BSC   Toileting- Clothing Manipulation and Hygiene: Maximal assistance;Sit to/from stand;Sitting/lateral lean       Functional mobility during ADLs: Minimal assistance General ADL Comments: pt sitting EOB upon arrival with gi swife just beginning bathing. Pt participated as much as possible with OT, however fatigued very easily and required max A to complete selfcare sitting EOB     Vision Baseline Vision/History: Wears glasses Wears Glasses: Reading only Patient Visual Report: No change from baseline       Perception     Praxis      Pertinent Vitals/Pain Faces Pain Scale: Hurts little more Pain Location: R chest rib area Pain Descriptors / Indicators: Aching;Sore;Discomfort Pain Intervention(s): Monitored during session;Premedicated before session;Repositioned     Hand Dominance Right   Extremity/Trunk Assessment Upper Extremity Assessment Upper Extremity Assessment: Generalized weakness   Lower Extremity Assessment Lower Extremity Assessment: Defer to PT evaluation       Communication Communication Communication: No difficulties   Cognition Arousal/Alertness: Awake/alert Behavior During Therapy: WFL for tasks assessed/performed Overall Cognitive Status:  Within Functional Limits for tasks assessed                                     General Comments       Exercises     Shoulder Instructions      Home Living Family/patient expects to be discharged to:: Private residence Living Arrangements:  Spouse/significant other Available Help at Discharge: Family Type of Home: House Home Access: Stairs to enter Technical brewer of Steps: 2 Entrance Stairs-Rails: Left Home Layout: One level     Bathroom Shower/Tub: Teacher, early years/pre: Standard     Home Equipment: Shower seat - built in;Hand held shower head          Prior Functioning/Environment Level of Independence: Independent        Comments: works and was driving.         OT Problem List: Decreased strength;Decreased activity tolerance;Decreased knowledge of use of DME or AE;Impaired balance (sitting and/or standing);Pain      OT Treatment/Interventions: Self-care/ADL training;Energy conservation;Balance training;Therapeutic exercise;DME and/or AE instruction;Neuromuscular education;Therapeutic activities;Patient/family education    OT Goals(Current goals can be found in the care plan section) Acute Rehab OT Goals Patient Stated Goal: get stronger OT Goal Formulation: With patient/family Time For Goal Achievement: 02/07/18 Potential to Achieve Goals: Good ADL Goals Pt Will Perform Grooming: with set-up;sitting Pt Will Perform Upper Body Bathing: with min assist;sitting Pt Will Perform Lower Body Bathing: with mod assist;sitting/lateral leans;sit to/from stand Pt Will Perform Upper Body Dressing: with min assist Pt Will Transfer to Toilet: with min guard assist;with supervision;bedside commode Pt Will Perform Toileting - Clothing Manipulation and hygiene: with mod assist;sitting/lateral leans;sit to/from stand  OT Frequency: Min 2X/week   Barriers to D/C: Decreased caregiver support          Co-evaluation              AM-PAC OT "6 Clicks" Daily Activity     Outcome Measure Help from another person eating meals?: None Help from another person taking care of personal grooming?: A Little Help from another person toileting, which includes using toliet, bedpan, or urinal?: A  Lot Help from another person bathing (including washing, rinsing, drying)?: A Lot Help from another person to put on and taking off regular upper body clothing?: A Lot Help from another person to put on and taking off regular lower body clothing?: A Lot 6 Click Score: 15   End of Session Equipment Utilized During Treatment: Gait belt;Other (comment)(BSC)  Activity Tolerance: Patient limited by fatigue Patient left: in bed;with call bell/phone within reach;with family/visitor present  OT Visit Diagnosis: Muscle weakness (generalized) (M62.81);History of falling (Z91.81);Pain Pain - Right/Left: Right Pain - part of body: (ribs)                Time: 3825-0539 OT Time Calculation (min): 26 min Charges:  OT General Charges $OT Visit: 1 Visit OT Evaluation $OT Eval Moderate Complexity: 1 Mod OT Treatments $Therapeutic Activity: 8-22 mins    Britt Bottom 01/24/2018, 1:03 PM

## 2018-01-25 ENCOUNTER — Inpatient Hospital Stay (HOSPITAL_COMMUNITY): Payer: Medicare Other

## 2018-01-25 ENCOUNTER — Telehealth: Payer: Self-pay | Admitting: Emergency Medicine

## 2018-01-25 LAB — GLUCOSE, CAPILLARY
Glucose-Capillary: 249 mg/dL — ABNORMAL HIGH (ref 70–99)
Glucose-Capillary: 230 mg/dL — ABNORMAL HIGH (ref 70–99)
Glucose-Capillary: 260 mg/dL — ABNORMAL HIGH (ref 70–99)
Glucose-Capillary: 215 mg/dL — ABNORMAL HIGH (ref 70–99)

## 2018-01-25 LAB — CULTURE, BLOOD (ROUTINE X 2): Culture: NO GROWTH

## 2018-01-25 MED ORDER — TRAZODONE HCL 50 MG PO TABS
50.0000 mg | ORAL_TABLET | Freq: Once | ORAL | Status: AC
  Administered 2018-01-25: 50 mg via ORAL
  Filled 2018-01-25: qty 1

## 2018-01-25 MED ORDER — INSULIN DETEMIR 100 UNIT/ML ~~LOC~~ SOLN
12.0000 [IU] | Freq: Two times a day (BID) | SUBCUTANEOUS | Status: DC
  Administered 2018-01-25 – 2018-01-29 (×7): 12 [IU] via SUBCUTANEOUS
  Filled 2018-01-25 (×9): qty 0.12

## 2018-01-25 MED ORDER — INSULIN DETEMIR 100 UNIT/ML ~~LOC~~ SOLN
14.0000 [IU] | Freq: Every day | SUBCUTANEOUS | Status: DC
  Filled 2018-01-25 (×3): qty 0.14

## 2018-01-25 MED ORDER — LIDOCAINE VISCOUS HCL 2 % MT SOLN
15.0000 mL | Freq: Once | OROMUCOSAL | Status: AC
  Administered 2018-01-25: 15 mL via ORAL
  Filled 2018-01-25: qty 15

## 2018-01-25 MED ORDER — MORPHINE SULFATE (PF) 2 MG/ML IV SOLN
2.0000 mg | Freq: Once | INTRAVENOUS | Status: AC
  Administered 2018-01-25: 2 mg via INTRAVENOUS
  Filled 2018-01-25: qty 1

## 2018-01-25 MED ORDER — ALUM & MAG HYDROXIDE-SIMETH 200-200-20 MG/5ML PO SUSP
30.0000 mL | Freq: Once | ORAL | Status: AC
  Administered 2018-01-25: 30 mL via ORAL
  Filled 2018-01-25: qty 30

## 2018-01-25 NOTE — Progress Notes (Signed)
[place] TEAM 1 - Stepdown/ICU TEAM  ROXANNE ORNER  FWY:[id number] DOB: 05/08/1940 DOA: 01/18/2018 PCP: Jinny Sanders, MD    Brief Narrative:  77 year old male w/ a hx of HTN, PE, PVD, DM2,atrial fibrillation, gastric bypass, chronic hypoxic respiratory failure due to presumed interstitial lung disease, and prostate cancer who was seen at the pulmonary clinic after a recent discharge from [facility], placedon steroids, and advised to increase hissupplemental oxygen. He presented to the ED with worsening weakness, mechanical fall, and respiratory distress.    Subjective: The patient reports significant dyspnea and anxiety.  He states that going down to radiology for his CT chest left him very fatigued and short of breath.  He denies chest pain.  He denies nausea or vomiting.  He reports a very poor appetite.  His nurse confirms that he is eating very little.  Assessment & Plan:  Acute on chronic hypoxemic respiratory failure / underlying ILD NOS high dose systemic steroids - follow with the pulmonary fibrosis/ILD clinic on discharge - med tx per PCCM -awaiting results of high-resolution chest CT  Chronic Afib w/ acute RVR Heart rate well controlled -already anticoagulated  Hx of Pulmonary embolism Recurred while therapeutic on warfarin - PCCM suggests enoxaparin long-term as the patient cannot afford DOAC  Coag negative staph in blood culture 1 of 2 -clinically most consistent with a contaminant -no clinical evidence to suggest active infection  Asymmetric irregular wall thickening noted distal esophagus Appreciated on CTangio chest 01/12/18 during prior admit- distal esophageal neoplasm a concern - will need EGD when resp status more stable -discussed finding with patient and wife at bedside -wife reports a very long history of severe reflux and multiple prior EGDs -currently patient's respiratory status is not stable enough to allow a repeat EGD -this will need to be  pursued in the outpatient setting as soon as the patient's respiratory status stabilizes  DM2 with hyperglycemia CBG remains poorly controlled - adjust tx again today and follow trend   AKI on CKD stage 3 Creatinine stable at approximate baseline  Prostate Cancer Follow-up with the urologist and primary care provider on discharge -asymptomatic  Oral candidiasis with possible candidal esophagitis: Due to prolonged use of steroids - complete course of Diflucan and nystatin  DVT prophylaxis: lovenox  Code Status: DNR - NO CODE Family Communication: Spoke with wife at bedside Disposition Plan: CIR denied - for SNF when stable - probable d/c to SNF 12/18 if clinically stable (very dyspneic presently)   Consultants:  PCCM Palliative Care   Antimicrobials:  none  Objective: Blood pressure 117/71, pulse (!) 106, temperature 97.9 F (36.6 C), temperature source Oral, resp. rate (!) 22, height 6' (1.829 m), weight 80.3 kg, SpO2 98 %.  Intake/Output Summary (Last 24 hours) at 01/25/2018 0839 Last data filed at 01/25/2018 0600 Gross per 24 hour  Intake 300 ml  Output 930 ml  Net -630 ml   Filed Weights   01/19/18 1015  Weight: 80.3 kg    Examination: General: dyspnea at rest persists - able to complete full sentences  Lungs: Fine crackles diffusely - no wheezing  Cardiovascular: Irregularly irregular - no M or rub  Abdomen: NT/ND, soft, BS+, no mass  Extremities: No signif edema bilateral lower extremities  CBC: Recent Labs  Lab 01/18/18 1045 01/24/18 0225  WBC 21.7* 26.9*  NEUTROABS 18.4* 24.8*  HGB 12.2* 12.2*  HCT 39.8 38.8*  MCV 92.6 90.7  PLT 200 277   Basic Metabolic Panel: Recent  Labs  Lab 01/18/18 1045  01/20/18 0246 01/21/18 0229 01/24/18 0225  NA 135   < > 135 136 134*  K 4.5   < > 4.9 4.6 4.9  CL 99   < > 99 98 99  CO2 22   < > 24 26 23   GLUCOSE 230*   < > 323* 235* 192*  BUN 28*   < > 54* 51* 34*  CREATININE 1.53*   < > 1.71* 1.52* 1.29*    CALCIUM 8.6*   < > 8.1* 8.1* 7.9*  MG 1.9  --   --   --  2.3  PHOS  --   --   --   --  3.2   < > = values in this interval not displayed.   GFR: Estimated Creatinine Clearance: 53.5 mL/min (A) (by C-G formula based on SCr of 1.29 mg/dL (H)).  Liver Function Tests: Recent Labs  Lab 01/18/18 1045 01/24/18 0225  AST 28  --   ALT 19  --   ALKPHOS 86  --   BILITOT 0.8  --   PROT 6.5  --   ALBUMIN 2.7* 2.2*    Coagulation Profile: Recent Labs  Lab 01/20/18 0246 01/21/18 0229 01/22/18 0246 01/23/18 0606 01/24/18 0225  INR 4.26* 2.93 2.38 1.56 1.57    HbA1C: Hgb A1c MFr Bld  Date/Time Value Ref Range Status  10/14/2017 07:52 AM 6.6 (H) 4.6 - 6.5 % Final    Comment:    Glycemic Control Guidelines for People with Diabetes:Non Diabetic:  <6%Goal of Therapy: <7%Additional Action Suggested:  >8%   04/16/2017 10:02 AM 7.9 (H) 4.8 - 5.6 % Final    Comment:    (NOTE)         Prediabetes: 5.7 - 6.4         Diabetes: >6.4         Glycemic control for adults with diabetes: <7.0     CBG: Recent Labs  Lab 01/24/18 0803 01/24/18 1209 01/24/18 1802 01/24/18 2123 01/25/18 0754  GLUCAP 184* 235* 201* 156* 249*    Recent Results (from the past 240 hour(s))  Blood culture (routine x 2)     Status: Abnormal   Collection Time: 01/18/18  2:58 PM  Result Value Ref Range Status   Specimen Description BLOOD RIGHT ANTECUBITAL  Final   Special Requests   Final    BOTTLES DRAWN AEROBIC AND ANAEROBIC Blood Culture adequate volume   Culture  Setup Time   Final    AEROBIC BOTTLE ONLY GRAM POSITIVE COCCI Organism ID to follow CRITICAL RESULT CALLED TO, READ BACK BY AND VERIFIED WITH: A MEYER PHARMD 01/19/18 1855 JDW    Culture (A)  Final    STAPHYLOCOCCUS SPECIES (COAGULASE NEGATIVE) THE SIGNIFICANCE OF ISOLATING THIS ORGANISM FROM A SINGLE SET OF BLOOD CULTURES WHEN MULTIPLE SETS ARE DRAWN IS UNCERTAIN. PLEASE NOTIFY THE MICROBIOLOGY DEPARTMENT WITHIN ONE WEEK IF SPECIATION AND  SENSITIVITIES ARE REQUIRED. Performed at [facility], [place] [address] [place], [place] [address]    Report Status 01/21/2018 FINAL  Final  Blood Culture ID Panel (Reflexed)     Status: Abnormal   Collection Time: 01/18/18  2:58 PM  Result Value Ref Range Status   Enterococcus species NOT DETECTED NOT DETECTED Final   Listeria monocytogenes NOT DETECTED NOT DETECTED Final   Staphylococcus species DETECTED (A) NOT DETECTED Final    Comment: Methicillin (oxacillin) susceptible coagulase negative staphylococcus. Possible blood culture contaminant (unless isolated from more  than one blood culture draw or clinical case suggests pathogenicity). No antibiotic treatment is indicated for blood  culture contaminants. CRITICAL RESULT CALLED TO, READ BACK BY AND VERIFIED WITH: A MEYER PHARMD 01/19/18 1855 JDW    Staphylococcus aureus (BCID) NOT DETECTED NOT DETECTED Final   Methicillin resistance NOT DETECTED NOT DETECTED Final   Streptococcus species NOT DETECTED NOT DETECTED Final   Streptococcus agalactiae NOT DETECTED NOT DETECTED Final   Streptococcus pneumoniae NOT DETECTED NOT DETECTED Final   Streptococcus pyogenes NOT DETECTED NOT DETECTED Final   Acinetobacter baumannii NOT DETECTED NOT DETECTED Final   Enterobacteriaceae species NOT DETECTED NOT DETECTED Final   Enterobacter cloacae complex NOT DETECTED NOT DETECTED Final   Escherichia coli NOT DETECTED NOT DETECTED Final   Klebsiella oxytoca NOT DETECTED NOT DETECTED Final   Klebsiella pneumoniae NOT DETECTED NOT DETECTED Final   Proteus species NOT DETECTED NOT DETECTED Final   Serratia marcescens NOT DETECTED NOT DETECTED Final   Haemophilus influenzae NOT DETECTED NOT DETECTED Final   Neisseria meningitidis NOT DETECTED NOT DETECTED Final   Pseudomonas aeruginosa NOT DETECTED NOT DETECTED Final   Candida albicans NOT DETECTED NOT DETECTED Final   Candida glabrata NOT DETECTED NOT DETECTED Final   Candida krusei NOT DETECTED  NOT DETECTED Final   Candida parapsilosis NOT DETECTED NOT DETECTED Final   Candida tropicalis NOT DETECTED NOT DETECTED Final    Comment: Performed at [facility], [place]. [address] [place], [place] [address]  Blood culture (routine x 2)     Status: None (Preliminary result)   Collection Time: 01/18/18  3:50 PM  Result Value Ref Range Status   Specimen Description BLOOD LEFT HAND  Final   Special Requests   Final    BOTTLES DRAWN AEROBIC AND ANAEROBIC Blood Culture results may not be optimal due to an inadequate volume of blood received in culture bottles   Culture   Final    NO GROWTH 4 DAYS Performed at [facility], [place] [address] [place], [place] [address]    Report Status PENDING  Incomplete  MRSA PCR Screening     Status: None   Collection Time: 01/18/18  6:33 PM  Result Value Ref Range Status   MRSA by PCR NEGATIVE NEGATIVE Final    Comment:        The GeneXpert MRSA Assay (FDA approved for NASAL specimens only), is one component of a comprehensive MRSA colonization surveillance program. It is not intended to diagnose MRSA infection nor to guide or monitor treatment for MRSA infections. Performed at [facility], [place] [address] [place], [place] [address]   Urine culture     Status: None   Collection Time: 01/18/18  9:07 PM  Result Value Ref Range Status   Specimen Description URINE, CLEAN CATCH  Final   Special Requests NONE  Final   Culture   Final    NO GROWTH Performed at [facility], [place] [address], [place], [place] [address]    Report Status 01/20/2018 FINAL  Final     Scheduled Meds: . atorvastatin  80 mg Oral QHS  . enoxaparin (LOVENOX) injection  120 mg Subcutaneous Q24H  . feeding supplement (ENSURE ENLIVE)  237 mL Oral BID BM  . feeding supplement (GLUCERNA SHAKE)  237 mL Oral TID BM  . fluconazole  100 mg Oral Daily  . FLUoxetine  60 mg Oral QHS  . insulin aspart  0-15 Units Subcutaneous TID WC  .  insulin aspart  0-5  Units Subcutaneous QHS  . insulin detemir  10 Units Subcutaneous Daily  . ipratropium-albuterol  3 mL Nebulization TID  . lidocaine  1 patch Transdermal Q24H  . methylPREDNISolone (SOLU-MEDROL) injection  60 mg Intravenous Q12H  . metoprolol tartrate  25 mg Oral BID  . nystatin  5 mL Oral QID  . senna-docusate  1 tablet Oral BID  . sodium chloride flush  3 mL Intravenous Q12H      LOS: 7 days   Cherene Altes, MD [facility] Office  [phone] Pager - Text Page per Amion  If 7PM-7AM, please contact night-coverage per Amion 01/25/2018, 8:39 AM

## 2018-01-25 NOTE — Progress Notes (Signed)
IP rehab admissions - I met with patient and his wife.  Please see rehab consult done by Dr. Posey Pronto recommending SNF.  Wife very upset that we did not accept patient for CIR.  Case manager and social worker are aware of need for SNF placement.  Call me for questions.  [phone]

## 2018-01-25 NOTE — Clinical Social Work Note (Signed)
Clinical Social Work Assessment  Patient Details  Name: [name] MRN: [id number] Date of Birth: 1940/05/12  Date of referral:  01/25/18               Reason for consult:  Facility Placement                Permission sought to share information with:  Chartered certified accountant granted to share information::  Yes, Verbal Permission Granted  Name::     Audiological scientist::  [facility], [facility]  Relationship::  Spouse  Contact Information:     Housing/Transportation Living arrangements for the past 2 months:  [place] of Information:  Patient, Spouse Patient Interpreter Needed:  None Criminal Activity/Legal Involvement Pertinent to Current Situation/Hospitalization:  No - Comment as needed Significant Relationships:  Spouse Lives with:  Spouse Do you feel safe going back to the place where you live?  No Need for family participation in patient care:  No (Coment)  Care giving concerns:  Pt is alert and oriented. Pt's spouse present at bedside.  Social Worker assessment / plan:  CSW spoke with pt's spouse and pt at bedside. Pt is from home with spouse. Pt's spouse and pt are not too happy about SNF and are only agreeable to [facility] or [facility]. [facility] is full until Friday. [facility] is reviewing pt's referral. If pt can not get into SNF pt is eligible for home first. CSW to follow up with facility and pt and pt's spouse.  Employment status:  Retired Forensic scientist:  Medicare PT Recommendations:  [place] / Referral to community resources:  [place]  Patient/Family's Response to care:  Pt verbalized understanding of CSW role and expressed appreciation for support. Pt denies any concern regarding pt care at this time.   Patient/Family's Understanding of and Emotional Response to Diagnosis, Current Treatment, and Prognosis:  Pt understanding and realistic regarding physical limitations. Pt  understands the need for SNF placement at d/c. Pt agreeable to SNF placement at d/c, at this time. Pt's responses emotionally appropriate during conversation with CSW. Pt denies any concern regarding treatment plan at this time. CSW will continue to provide support and facilitate d/c needs.   Emotional Assessment Appearance:  Appears stated age Attitude/Demeanor/Rapport:  (Pt was appropriate) Affect (typically observed):  Accepting, Appropriate, Frustrated Orientation:  Oriented to Self, Oriented to Place, Oriented to  Time, Oriented to Situation Alcohol / Substance use:  Not Applicable Psych involvement (Current and /or in the community):  No (Comment)  Discharge Needs  Concerns to be addressed:  Basic Needs, Care Coordination Readmission within the last 30 days:  Yes Current discharge risk:  Dependent with Mobility Barriers to Discharge:  Continued Medical Work up   [employer], LCSW 01/25/2018, 12:57 PM

## 2018-01-25 NOTE — Consult Note (Signed)
   [facility] CM Inpatient Consult   01/25/2018  JAMARIEN RODKEY 28-Nov-1940 [id number]     Patient screened for potential [facility] Care Management services due to unplanned readmission risk score of 28% (high).  Went to bedside to speak with Mr. Lamping and wife about [facility] Management program. However wife and patient were speaking with inpatient LCSW.about potential SNF placement to either [facility] or [facility].   Discussed briefly with patient and wife that Mr. Mirarchi is also eligible for [facility] program if interested.   Will continue to follow along for progression and disposition plans.   Marthenia Rolling, MSN-Ed, RN,BSN [facility] Liaison [phone]

## 2018-01-25 NOTE — NC FL2 (Addendum)
[place] LEVEL OF CARE SCREENING TOOL     IDENTIFICATION  Patient Name: [name] Birthdate: 05-03-1940 Sex: male Admission Date (Current Location): 01/18/2018  [facility] and [place] Number:  Herbalist and Address:  The [place], [place] [address] [place], [place] [address]      Provider Number: [id number]  Attending Physician Name and Address:  Cherene Altes, MD  Relative Name and Phone Number:       Current Level of Care: Hospital Recommended Level of Care: [place] Prior Approval Number:    Date Approved/Denied:   PASRR Number: [id number]   Discharge Plan: SNF    Current Diagnoses: Patient Active Problem List   Diagnosis Date Noted  . PVD (peripheral vascular disease) ([place])   . Fibromyalgia   . H/O gastric bypass   . History of pulmonary embolism   . Leukocytosis   . Fall   . Goals of care, counseling/discussion   . Palliative care by specialist   . ILD (interstitial lung disease) ([place])   . Protein-calorie malnutrition, severe 01/19/2018  . Acute on chronic respiratory failure with hypoxia ([place]) 01/18/2018  . CKD (chronic kidney disease) stage 3, GFR 30-59 ml/min (HCC) 01/18/2018  . Pulmonary embolus ([place])   . Abnormal finding on lung imaging 01/12/2018  . Pulmonary fibrosis ([place]) 01/12/2018  . Malignant neoplasm of prostate ([place]) 11/29/2017  . Dizziness 09/01/2017  . Decubitus ulcer of right ankle, stage 1 06/22/2017  . Chronic insomnia 06/22/2017  . Elevated LFTs 06/11/2017  . MRSA (methicillin resistant Staphylococcus aureus) infection 06/11/2017  . Acute pain of right knee 06/11/2017  . Hypotension 06/11/2017  . Primary localized osteoarthritis of right knee 04/27/2017  . Primary osteoarthritis of right knee 04/27/2017  . Acute neck pain 10/30/2016  . Anticoagulant long-term use 09/14/2016  . S/P panniculectomy 08/25/2016  . Persistent cough for 3 weeks or longer 02/28/2016   . Internal hernia 03/08/2015  . GERD (gastroesophageal reflux disease) 08/03/2014  . Acid reflux 08/03/2014  . ED (erectile dysfunction) of organic origin 07/02/2014  . Type 2 diabetes mellitus with neurologic complication ([place]) 18/29/9371  . Counseling regarding end of life decision making 01/23/2014  . Dysphagia, pharyngoesophageal phase 08/30/2013  . Benign essential tremor 08/30/2013  . Postoperative pain, acute, shoulder 05/13/2013  . Arthralgia of shoulder 05/13/2013  . Cervical spinal stenosis 05/09/2013  . Encounter for therapeutic drug monitoring 04/26/2013  . Peripheral autonomic neuropathy due to diabetes mellitus ([place]) 12/11/2011  . Hereditary and idiopathic neuropathy 12/11/2011  . 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 History of surgical procedure 09/03/2011  . Atrial fibrillation ([place]) 08/12/2011  . SOB (shortness of breath) 12/08/2010  . Bacterial overgrowth syndrome 10/28/2010  . Nonsurgical dumping syndrome 10/14/2010  . 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 Diverticulitis of colon 09/02/2010  . Vascular disorder of lower extremity 08/01/2010  . Osteoporosis 11/26/2009  . Anemia of chronic disease 09/02/2009  . RENAL INSUFFICIENCY 09/02/2009  . Allergic rhinitis 01/17/2009  . Asthma, moderate persistent 11/13/2008  . Airway hyperreactivity 11/13/2008  . KNEE REPLACEMENT, LEFT, HX OF 12/18/2006  . HERPES ZOSTER W/NERVOUS COMPLICATION NEC 69/67/8938  . HYPERCHOLESTEROLEMIA 05/19/2006  . ANXIETY 05/19/2006  . Major depressive disorder, recurrent, mild ([place]) 05/19/2006  . RESTLESS LEG SYNDROME 05/19/2006  . IRRITABLE BOWEL SYNDROME 05/19/2006  . LOW BACK PAIN, CHRONIC 05/19/2006  . GAD (generalized anxiety disorder) 05/19/2006  . Diverticular disease of large intestine 05/19/2006  .  Essential (primary) hypertension 05/19/2006  . Adaptive colitis 05/19/2006    Orientation RESPIRATION BLADDER Height & Weight     Self, Time,  Place, Situation  O2(Nasal Cannula 2L) Continent Weight: 177 lb 0.5 oz (80.3 kg) Height:  6' (182.9 cm)  BEHAVIORAL SYMPTOMS/MOOD NEUROLOGICAL BOWEL NUTRITION STATUS      Continent Diet(Heart healthy, thin liquids)  AMBULATORY STATUS COMMUNICATION OF NEEDS Skin   Limited Assist Verbally Normal                       Personal Care Assistance Level of Assistance  Dressing, Feeding, Bathing Bathing Assistance: Limited assistance Feeding assistance: Independent Dressing Assistance: Limited assistance     Functional Limitations Info  Sight, Hearing, Speech Sight Info: Adequate Hearing Info: Adequate Speech Info: Adequate    SPECIAL CARE FACTORS FREQUENCY  PT (By licensed PT), OT (By licensed OT)     PT Frequency: 3x OT Frequency: 3x            Contractures Contractures Info: Not present    Additional Factors Info  Code Status, Allergies Code Status Info: DNR Allergies Info: Sulfacetamide Sodium, Latex, Adhesive Tape           Current Medications (01/25/2018):  This is the current hospital active medication list Current Facility-Administered Medications  Medication Dose Route Frequency Provider Last Rate Last Dose  . albuterol (PROVENTIL) (2.5 MG/3ML) 0.083% nebulizer solution 2.5 mg  2.5 mg Nebulization Q2H PRN Karmen Bongo, MD      . atorvastatin (LIPITOR) tablet 80 mg  80 mg Oral Ivery Quale, MD   80 mg at 01/24/18 2104  . bisacodyl (DULCOLAX) suppository 10 mg  10 mg Rectal Daily PRN Dove, Tasha A, NP      . enoxaparin (LOVENOX) injection 120 mg  120 mg Subcutaneous Q24H Cherene Altes, MD   120 mg at 01/25/18 0932  . feeding supplement (ENSURE ENLIVE) (ENSURE ENLIVE) liquid 237 mL  237 mL Oral BID BM Karmen Bongo, MD   237 mL at 01/25/18 0957  . feeding supplement (GLUCERNA SHAKE) (GLUCERNA SHAKE) liquid 237 mL  237 mL Oral TID BM Arrien, Jimmy Picket, MD   237 mL at 01/24/18 6387  . fluconazole (DIFLUCAN) tablet 100 mg  100 mg Oral Daily  Dana Allan I, MD   100 mg at 01/25/18 0932  . FLUoxetine (PROZAC) capsule 60 mg  60 mg Oral Ivery Quale, MD   60 mg at 01/24/18 2104  . guaiFENesin-dextromethorphan (ROBITUSSIN DM) 100-10 MG/5ML syrup 5 mL  5 mL Oral Q4H PRN Arrien, Jimmy Picket, MD   5 mL at 01/21/18 0338  . HYDROcodone-acetaminophen (NORCO/VICODIN) 5-325 MG per tablet 1 tablet  1 tablet Oral Q6H PRN Bonnell Public, MD   1 tablet at 01/24/18 2321  . insulin aspart (novoLOG) injection 0-15 Units  0-15 Units Subcutaneous TID WC Karmen Bongo, MD   5 Units at 01/25/18 [phone]  . insulin aspart (novoLOG) injection 0-5 Units  0-5 Units Subcutaneous QHS Karmen Bongo, MD   3 Units at 01/21/18 2114  . insulin detemir (LEVEMIR) injection 14 Units  14 Units Subcutaneous Daily Joette Catching T, MD      . ipratropium-albuterol (DUONEB) 0.5-2.5 (3) MG/3ML nebulizer solution 3 mL  3 mL Nebulization TID Karmen Bongo, MD   3 mL at 01/25/18 0752  . lidocaine (LIDODERM) 5 % 1 patch  1 patch Transdermal Q24H Dana Allan I, MD   1 patch at 01/24/18 1234  .  methylPREDNISolone sodium succinate (SOLU-MEDROL) 125 mg/2 mL injection 60 mg  60 mg Intravenous Q12H Collene Gobble, MD   60 mg at 01/25/18 0932  . metoprolol tartrate (LOPRESSOR) tablet 25 mg  25 mg Oral BID Tawni Millers, MD   25 mg at 01/25/18 0931  . nystatin (MYCOSTATIN) 100000 UNIT/ML suspension 500,000 Units  5 mL Oral QID Dana Allan I, MD   500,000 Units at 01/25/18 0932  . senna-docusate (Senokot-S) tablet 1 tablet  1 tablet Oral BID [name] A, NP   1 tablet at 01/25/18 0933  . sodium chloride flush (NS) 0.9 % injection 3 mL  3 mL Intravenous Q12H Karmen Bongo, MD   3 mL at 01/24/18 2105  . zolpidem (AMBIEN) tablet 5 mg  5 mg Oral QHS PRN Schorr, Rhetta Mura, NP   5 mg at 01/24/18 2104     Discharge Medications: Please see discharge summary for a list of discharge medications.  Relevant Imaging Results:  Relevant Lab  Results:   Additional Information SSN: [id number]  Eileen Stanford, LCSW

## 2018-01-25 NOTE — Telephone Encounter (Signed)
Spoke to the patient's wife today. She is wondering if there is any true benefit to be had from treatment of his newly identified interstitial lung disease, from going to SNF for physical therapy, low impact rehab.  I explained to them that the diagnosis of interstitial lung disease, especially apparent groundglass infiltrate consistent with flaring disease that may be reversible, is brand-new.  He is only been on corticosteroids for days and is already clinically improved.  I think it is premature to determine whether his overall functional capacity has plateaued although I understand their desire to have an idea about his limitations, prognosis.  My recommendation stays the same.  I think he should continue on a stable corticosteroid dose to treat what may be a flare of ILD even though specific etiology has not been determined.  Continue his efforts at rehab.  After several weeks I think he needs to follow with Dr. Chase Caller and see whether he is clinically benefited, then determine how to go forward with diagnostics and treatment for his interstitial disease.  I explained to them that he may have repeat imaging at that time to look for interval change.  His wife expressed appreciation.  She and the husband are both frustrated with how sick he has been.  She indicated that they did not want to continue to do a lot of medications or aggressive interventions "if he was just never going to get better".  Again, I tried to stress to them that we do not have enough information to know how much better he will get, where his functional plateau will be.  I will forward this note to Dr. Chase Caller so he knows the background when it is time for them to see him in the office.  Baltazar Apo, MD, PhD 01/25/2018, 1:42 PM [facility] [phone] or if no answer [phone]

## 2018-01-26 ENCOUNTER — Telehealth: Payer: Self-pay | Admitting: Internal Medicine

## 2018-01-26 LAB — GLUCOSE, CAPILLARY
GLUCOSE-CAPILLARY: 193 mg/dL — AB (ref 70–99)
Glucose-Capillary: 225 mg/dL — ABNORMAL HIGH (ref 70–99)
Glucose-Capillary: 218 mg/dL — ABNORMAL HIGH (ref 70–99)
Glucose-Capillary: 108 mg/dL — ABNORMAL HIGH (ref 70–99)

## 2018-01-26 LAB — CBC
HCT: 38.2 % — ABNORMAL LOW (ref 39.0–52.0)
Hemoglobin: 12.5 g/dL — ABNORMAL LOW (ref 13.0–17.0)
MCH: 29.6 pg (ref 26.0–34.0)
MCHC: 32.7 g/dL (ref 30.0–36.0)
MCV: 90.3 fL (ref 80.0–100.0)
Platelets: 301 10*3/uL (ref 150–400)
RBC: 4.23 MIL/uL (ref 4.22–5.81)
RDW: 13.3 % (ref 11.5–15.5)
WBC: 24.8 10*3/uL — ABNORMAL HIGH (ref 4.0–10.5)
nRBC: 0 % (ref 0.0–0.2)

## 2018-01-26 LAB — COMPREHENSIVE METABOLIC PANEL
ALT: 21 U/L (ref 0–44)
AST: 20 U/L (ref 15–41)
Albumin: 2.1 g/dL — ABNORMAL LOW (ref 3.5–5.0)
Alkaline Phosphatase: 63 U/L (ref 38–126)
Anion gap: 11 (ref 5–15)
BUN: 46 mg/dL — ABNORMAL HIGH (ref 8–23)
CO2: 25 mmol/L (ref 22–32)
Calcium: 8.1 mg/dL — ABNORMAL LOW (ref 8.9–10.3)
Chloride: 96 mmol/L — ABNORMAL LOW (ref 98–111)
Creatinine, Ser: 1.55 mg/dL — ABNORMAL HIGH (ref 0.61–1.24)
GFR calc Af Amer: 50 mL/min — ABNORMAL LOW (ref >60)
GFR calc non Af Amer: 43 mL/min — ABNORMAL LOW (ref >60)
Glucose, Bld: 244 mg/dL — ABNORMAL HIGH (ref 70–99)
POTASSIUM: 4.9 mmol/L (ref 3.5–5.1)
Sodium: 132 mmol/L — ABNORMAL LOW (ref 135–145)
Total Bilirubin: 0.5 mg/dL (ref 0.3–1.2)
Total Protein: 5.8 g/dL — ABNORMAL LOW (ref 6.5–8.1)

## 2018-01-26 MED ORDER — PANTOPRAZOLE SODIUM 40 MG PO TBEC
40.0000 mg | DELAYED_RELEASE_TABLET | Freq: Every day | ORAL | Status: DC
  Administered 2018-01-27 – 2018-01-29 (×3): 40 mg via ORAL
  Filled 2018-01-26 (×3): qty 1

## 2018-01-26 MED ORDER — MAGIC MOUTHWASH W/LIDOCAINE
5.0000 mL | Freq: Four times a day (QID) | ORAL | Status: DC
  Administered 2018-01-26 – 2018-01-27 (×4): 5 mL via ORAL
  Filled 2018-01-26 (×5): qty 5

## 2018-01-26 NOTE — Progress Notes (Addendum)
Nutrition Follow Up  DOCUMENTATION CODES:   Severe malnutrition in context of chronic illness  INTERVENTION:    Glucerna Shake po TID, each supplement provides 220 kcal and 10 grams of protein  NUTRITION DIAGNOSIS:   Severe Malnutrition related to chronic illness(pulmonary fibrosis) as evidenced by severe fat depletion, severe muscle depletion, ongoing  GOAL:   Patient will meet greater than or equal to 90% of their needs, progressing  MONITOR:   PO intake, Supplement acceptance, Labs, Skin, I & O's, Weight trends  ASSESSMENT:   77 yo Male with PMH significant of HTN; RLS; remote PE; prostate CA: PVD; DM, diet controlled s/p gastric bypass; and afib on Coumadin presenting with a fall and chest pain; new dx of pulmonary fibrosis.   Spoke with Suezanne Jacquet, Therapist, sports.  PO intake ~50% per flowsheet records. Pt does like Butter Pecan flavored Glucerna Shake supplements.  CIR denied pt. D/C to SNF when stable. Labs & medications reviewed. CBG's [phone].  Diet Order:   Diet Order            Diet regular Room service appropriate? Yes; Fluid consistency: Thin  Diet effective now             EDUCATION NEEDS:   No education needs have been identified at this time  Skin:  Skin Assessment: Reviewed RN Assessment  Last BM:  12/14  Height:   Ht Readings from Last 1 Encounters:  01/19/18 6' (1.829 m)   Weight:   Wt Readings from Last 1 Encounters:  01/19/18 80.3 kg   BMI:  Body mass index is 24.01 kg/m.  Estimated Nutritional Needs:   Kcal:  1800-2000  Protein:  85-100 gm  Fluid:  1.8-2.0 L  Arthur Holms, RD, LDN Pager #: [phone] After-Hours Pager #: [phone]

## 2018-01-26 NOTE — Progress Notes (Signed)
Physical Therapy Treatment Patient Details Name: [name] MRN: [id number] DOB: 06/27/1940 Today's Date: 01/26/2018    History of Present Illness 77 y.o. male admitted with respiratory failure requiring bipap, though to be due to idiopathic pulmonary fibrosis. PMH includes but not limited to: PE, peripheral vascular disease, HTN, asthma, prostate CA, pulmonary fibrosis, DM, GERD, chronic A Fib, Arthritis, B TKA, L TSA, back surgery.    PT Comments    Patient progressing well with therapy, doubling ambulation distance from prior session with 5 minute rest break in between. Pt de-sats with short distance ambulation (30') on 3L to 82% with DOE 3/4, returns quickly to 96% with rest. Reinforced IS and bed level therex with pt and wife, very motivated to regain mobility.     Follow Up Recommendations  SNF     Equipment Recommendations  None recommended by PT    Recommendations for Other Services OT consult;Rehab consult     Precautions / Restrictions Precautions Precautions: Fall Precaution Comments: Monitor Sats Restrictions Weight Bearing Restrictions: No    Mobility  Bed Mobility Overal bed mobility: Needs Assistance Bed Mobility: Supine to Sit     Supine to sit: Modified independent (Device/Increase time) Sit to supine: Min guard      Transfers Overall transfer level: Needs assistance Equipment used: Rolling walker (2 wheeled) Transfers: Sit to/from Stand Sit to Stand: Min guard;From elevated surface Stand pivot transfers: Min guard          Ambulation/Gait Ambulation/Gait assistance: Min guard Gait Distance (Feet): 60 Feet(30' 30') Assistive device: Rolling walker (2 wheeled) Gait Pattern/deviations: Step-to pattern;Step-through pattern Gait velocity: decreased   General Gait Details: ambulating to door and back with rest break. de sat on 3L to 80, returns to 96% within 1-2 minutes of rest.  balance improved, DOE 3/4    Stairs              Wheelchair Mobility    Modified Rankin (Stroke Patients Only)       Balance                                            Cognition Arousal/Alertness: Awake/alert Behavior During Therapy: WFL for tasks assessed/performed Overall Cognitive Status: Within Functional Limits for tasks assessed                                        Exercises      General Comments        Pertinent Vitals/Pain Faces Pain Scale: Hurts little more Pain Location: R chest rib area Pain Descriptors / Indicators: Aching;Sore;Discomfort Pain Intervention(s): Limited activity within patient's tolerance    Home Living                      Prior Function            PT Goals (current goals can now be found in the care plan section) Acute Rehab PT Goals Patient Stated Goal: get stronger PT Goal Formulation: With patient/family Time For Goal Achievement: 02/05/18 Potential to Achieve Goals: Good Progress towards PT goals: Progressing toward goals    Frequency    Min 3X/week      PT Plan Current plan remains appropriate    Co-evaluation  AM-PAC PT "6 Clicks" Mobility   Outcome Measure  Help needed turning from your back to your side while in a flat bed without using bedrails?: None Help needed moving from lying on your back to sitting on the side of a flat bed without using bedrails?: None Help needed moving to and from a bed to a chair (including a wheelchair)?: A Little Help needed standing up from a chair using your arms (e.g., wheelchair or bedside chair)?: A Little Help needed to walk in hospital room?: A Little Help needed climbing 3-5 steps with a railing? : A Lot 6 Click Score: 19    End of Session Equipment Utilized During Treatment: Gait belt;Oxygen Activity Tolerance: Patient limited by fatigue;Patient tolerated treatment well Patient left: in chair;with family/visitor present;with call bell/phone within  reach Nurse Communication: Mobility status;Other (comment) PT Visit Diagnosis: Difficulty in walking, not elsewhere classified (R26.2)     Time: 1441-1510 PT Time Calculation (min) (ACUTE ONLY): 29 min  Charges:  $Gait Training: 23-37 mins                     Reinaldo Berber, PT, DPT Acute Rehabilitation Services Pager: [phone] Office: [phone]     Reinaldo Berber 01/26/2018, 3:42 PM

## 2018-01-26 NOTE — Progress Notes (Signed)
[facility] TEAM 1 - Stepdown/ICU TEAM  CHRSITOPHER WIK  JKD:[id number] DOB: 11/13/1940 DOA: 01/18/2018 PCP: Jinny Sanders, MD    Brief Narrative:  77 year old male w/ a hx of HTN, PE, PVD, DM2,atrial fibrillation, gastric bypass, chronic hypoxic respiratory failure due to presumed interstitial lung disease, and prostate cancer who was seen at the pulmonary clinic after a recent discharge from [facility], placedon steroids, and advised to increase hissupplemental oxygen. He presented to the ED with worsening weakness, mechanical fall, and respiratory distress.    Subjective: Reports having pain in his mouth as well as throat while trying to swallow which is limiting his oral intake.  No nausea no vomiting.  Concern regarding having some earache.  No diarrhea reported.  Assessment & Plan:  Acute on chronic hypoxemic respiratory failure / underlying ILD NOS high dose systemic steroids - follow with the pulmonary fibrosis/ILD clinic on discharge - med tx per PCCM -high-resolution CT scan consistent with UIP. PCCM recommends transitioning to oral steroids tomorrow. Outpatient follow-up recommended in 1 week on discharge.  Chronic Afib w/ acute RVR Heart rate well controlled -already anticoagulated  Hx of Pulmonary embolism Recurred while therapeutic on warfarin - PCCM suggests enoxaparin long-term as the patient cannot afford DOAC  Coag negative staph in blood culture 1 of 2 -clinically most consistent with a contaminant -no clinical evidence to suggest active infection  Asymmetric irregular wall thickening noted distal esophagus Appreciated on CTangio chest 01/12/18 during prior admit- distal esophageal neoplasm a concern - will need EGD when resp status more stable -discussed finding with patient and wife at bedside -wife reports a very long history of severe reflux and multiple prior EGDs -currently patient's respiratory status is not stable enough to allow a repeat EGD -this  will need to be pursued in the outpatient setting as soon as the patient's respiratory status stabilizes  DM2 with hyperglycemia CBG remains poorly controlled - adjust tx again today and follow trend   AKI on CKD stage 3 Creatinine was improving up until 01/26/2018.  Now worsening again. We will hold off on Lopressor to allow blood pressure to run a little higher.  Recheck tomorrow.  Likely from poor p.o. intake.  Prostate Cancer Follow-up with the urologist and primary care provider on discharge -asymptomatic  Oral candidiasis with possible candidal esophagitis: Due to prolonged use of steroids - complete course of Diflucan and added Magic mouthwash.  DVT prophylaxis: lovenox  Code Status: DNR - NO CODE Family Communication: Spoke with wife at bedside Disposition Plan: CIR denied - for SNF when stable - probable d/c to SNF 12/20 if clinically stable (very dyspneic presently)   Consultants:  PCCM Palliative Care   Antimicrobials:  none  Objective: Blood pressure 104/76, pulse (!) 101, temperature 97.7 F (36.5 C), temperature source Oral, resp. rate 18, height 6' (1.829 m), weight 80.3 kg, SpO2 99 %.  Intake/Output Summary (Last 24 hours) at 01/26/2018 1917 Last data filed at 01/26/2018 0900 Gross per 24 hour  Intake 240 ml  Output 525 ml  Net -285 ml   Filed Weights   01/19/18 1015  Weight: 80.3 kg    Examination: General: dyspnea at rest persists - able to complete full sentences  Lungs: Fine crackles diffusely - no wheezing  Cardiovascular: Irregularly irregular - no M or rub  Abdomen: NT/ND, soft, BS+, no mass  Extremities: No signif edema bilateral lower extremities  CBC: Recent Labs  Lab 01/24/18 0225 01/26/18 0307  WBC 26.9* 24.8*  NEUTROABS 24.8*  --   HGB 12.2* 12.5*  HCT 38.8* 38.2*  MCV 90.7 90.3  PLT 190 481   Basic Metabolic Panel: Recent Labs  Lab 01/21/18 0229 01/24/18 0225 01/26/18 0307  NA 136 134* 132*  K 4.6 4.9 4.9  CL 98 99  96*  CO2 26 23 25   GLUCOSE 235* 192* 244*  BUN 51* 34* 46*  CREATININE 1.52* 1.29* 1.55*  CALCIUM 8.1* 7.9* 8.1*  MG  --  2.3  --   PHOS  --  3.2  --    GFR: Estimated Creatinine Clearance: 44.5 mL/min (A) (by C-G formula based on SCr of 1.55 mg/dL (H)).  Liver Function Tests: Recent Labs  Lab 01/24/18 0225 01/26/18 0307  AST  --  20  ALT  --  21  ALKPHOS  --  63  BILITOT  --  0.5  PROT  --  5.8*  ALBUMIN 2.2* 2.1*    Coagulation Profile: Recent Labs  Lab 01/20/18 0246 01/21/18 0229 01/22/18 0246 01/23/18 0606 01/24/18 0225  INR 4.26* 2.93 2.38 1.56 1.57    HbA1C: Hgb A1c MFr Bld  Date/Time Value Ref Range Status  10/14/2017 07:52 AM 6.6 (H) 4.6 - 6.5 % Final    Comment:    Glycemic Control Guidelines for People with Diabetes:Non Diabetic:  <6%Goal of Therapy: <7%Additional Action Suggested:  >8%   04/16/2017 10:02 AM 7.9 (H) 4.8 - 5.6 % Final    Comment:    (NOTE)         Prediabetes: 5.7 - 6.4         Diabetes: >6.4         Glycemic control for adults with diabetes: <7.0     CBG: Recent Labs  Lab 01/25/18 1609 01/25/18 2120 01/26/18 0748 01/26/18 1233 01/26/18 1702  GLUCAP 260* 215* 225* 218* 193*    Recent Results (from the past 240 hour(s))  Blood culture (routine x 2)     Status: Abnormal   Collection Time: 01/18/18  2:58 PM  Result Value Ref Range Status   Specimen Description BLOOD RIGHT ANTECUBITAL  Final   Special Requests   Final    BOTTLES DRAWN AEROBIC AND ANAEROBIC Blood Culture adequate volume   Culture  Setup Time   Final    AEROBIC BOTTLE ONLY GRAM POSITIVE COCCI Organism ID to follow CRITICAL RESULT CALLED TO, READ BACK BY AND VERIFIED WITH: A MEYER PHARMD 01/19/18 1855 JDW    Culture (A)  Final    STAPHYLOCOCCUS SPECIES (COAGULASE NEGATIVE) THE SIGNIFICANCE OF ISOLATING THIS ORGANISM FROM A SINGLE SET OF BLOOD CULTURES WHEN MULTIPLE SETS ARE DRAWN IS UNCERTAIN. PLEASE NOTIFY THE MICROBIOLOGY DEPARTMENT WITHIN ONE WEEK IF  SPECIATION AND SENSITIVITIES ARE REQUIRED. Performed at [facility], [place] [address] [place], [place] [address]    Report Status 01/21/2018 FINAL  Final  Blood Culture ID Panel (Reflexed)     Status: Abnormal   Collection Time: 01/18/18  2:58 PM  Result Value Ref Range Status   Enterococcus species NOT DETECTED NOT DETECTED Final   Listeria monocytogenes NOT DETECTED NOT DETECTED Final   Staphylococcus species DETECTED (A) NOT DETECTED Final    Comment: Methicillin (oxacillin) susceptible coagulase negative staphylococcus. Possible blood culture contaminant (unless isolated from more than one blood culture draw or clinical case suggests pathogenicity). No antibiotic treatment is indicated for blood  culture contaminants. CRITICAL RESULT CALLED TO, READ BACK BY AND VERIFIED WITH: A MEYER PHARMD 01/19/18 1855 JDW  Staphylococcus aureus (BCID) NOT DETECTED NOT DETECTED Final   Methicillin resistance NOT DETECTED NOT DETECTED Final   Streptococcus species NOT DETECTED NOT DETECTED Final   Streptococcus agalactiae NOT DETECTED NOT DETECTED Final   Streptococcus pneumoniae NOT DETECTED NOT DETECTED Final   Streptococcus pyogenes NOT DETECTED NOT DETECTED Final   Acinetobacter baumannii NOT DETECTED NOT DETECTED Final   Enterobacteriaceae species NOT DETECTED NOT DETECTED Final   Enterobacter cloacae complex NOT DETECTED NOT DETECTED Final   Escherichia coli NOT DETECTED NOT DETECTED Final   Klebsiella oxytoca NOT DETECTED NOT DETECTED Final   Klebsiella pneumoniae NOT DETECTED NOT DETECTED Final   Proteus species NOT DETECTED NOT DETECTED Final   Serratia marcescens NOT DETECTED NOT DETECTED Final   Haemophilus influenzae NOT DETECTED NOT DETECTED Final   Neisseria meningitidis NOT DETECTED NOT DETECTED Final   Pseudomonas aeruginosa NOT DETECTED NOT DETECTED Final   Candida albicans NOT DETECTED NOT DETECTED Final   Candida glabrata NOT DETECTED NOT DETECTED Final   Candida  krusei NOT DETECTED NOT DETECTED Final   Candida parapsilosis NOT DETECTED NOT DETECTED Final   Candida tropicalis NOT DETECTED NOT DETECTED Final    Comment: Performed at [facility], [place] [address], [place], [place] [address]  Blood culture (routine x 2)     Status: None   Collection Time: 01/18/18  3:50 PM  Result Value Ref Range Status   Specimen Description BLOOD LEFT HAND  Final   Special Requests   Final    BOTTLES DRAWN AEROBIC AND ANAEROBIC Blood Culture results may not be optimal due to an inadequate volume of blood received in culture bottles   Culture   Final    NO GROWTH 5 DAYS Performed at [facility], [place] [address], [place], [place] [address]    Report Status 01/25/2018 FINAL  Final  MRSA PCR Screening     Status: None   Collection Time: 01/18/18  6:33 PM  Result Value Ref Range Status   MRSA by PCR NEGATIVE NEGATIVE Final    Comment:        The GeneXpert MRSA Assay (FDA approved for NASAL specimens only), is one component of a comprehensive MRSA colonization surveillance program. It is not intended to diagnose MRSA infection nor to guide or monitor treatment for MRSA infections. Performed at [facility], [place] [address], [place], [place] [address]   Urine culture     Status: None   Collection Time: 01/18/18  9:07 PM  Result Value Ref Range Status   Specimen Description URINE, CLEAN CATCH  Final   Special Requests NONE  Final   Culture   Final    NO GROWTH Performed at [facility], [place] [address], [place], [place] [address]    Report Status 01/20/2018 FINAL  Final     Scheduled Meds: . atorvastatin  80 mg Oral QHS  . enoxaparin (LOVENOX) injection  120 mg Subcutaneous Q24H  . feeding supplement (ENSURE ENLIVE)  237 mL Oral BID BM  . feeding supplement (GLUCERNA SHAKE)  237 mL Oral TID BM  . fluconazole  100 mg Oral Daily  . FLUoxetine  60 mg Oral QHS  . insulin aspart  0-15 Units Subcutaneous TID WC  . insulin aspart  0-5  Units Subcutaneous QHS  . insulin detemir  12 Units Subcutaneous BID  . ipratropium-albuterol  3 mL Nebulization TID  . lidocaine  1 patch Transdermal Q24H  . magic mouthwash w/lidocaine  5  mL Oral QID  . methylPREDNISolone (SOLU-MEDROL) injection  60 mg Intravenous Q12H  . [START ON 01/27/2018] pantoprazole  40 mg Oral Q1200  . senna-docusate  1 tablet Oral BID  . sodium chloride flush  3 mL Intravenous Q12H      LOS: 8 days   Author:  Berle Mull, MD [facility] Pager: [phone] 01/26/2018 7:19 PM     If 7PM-7AM, please contact night-coverage per Amion 01/26/2018, 7:17 PM

## 2018-01-26 NOTE — Progress Notes (Addendum)
NAME:  [name], MRN:  [id number], DOB:  1940/07/15, LOS: 8 ADMISSION DATE:  01/18/2018, CONSULTATION DATE:  12/10 REFERRING MD:  Lorin Mercy, CHIEF COMPLAINT:  Acute on chronic hypoxic respiratory failure    Brief History      77 year old male patient who has a significant medical history as mentioned below recently  referred to [facility] pulmonary with new diagnosis of pulmonary fibrosis.  We had seen him in consult at [facility] on 12/4 for abnormal CT findings as well as pulmonary emboli.  We felt that the pulmonary emboli were small, and there is pulmonary fibrosis.  The overwhelming issue driving his hypoxia.  He was ultimately discharged to home with plan for follow-up in our clinic, prednisone taper, and then referral to ILD clinic.  He was discharged home on supplemental oxygen and supposed to have been on a prednisone taper which he did not get.  The prednisone taper was started in our clinic on hospital follow-up today.  He had been doing quite poorly with marked work of breathing at home on 2 L increasing to 3 L without assistance.  The prednisone was started initially on 12/9.  He presented to the emergency room today on 12/10 after a fall.  He reported he had not tripped or anything but essentially had become quite dizzy resulting in the fall.  He was brought to the emergency room for further evaluation, found to have pulse oximetry in the 70s on 2 L nasal cannula, he was weak, fatigued, slow to respond.  Oxygen was titrated up ultimately up to NIPPV.  Pulmonary asked to assist with acute hypoxic respiratory failure  Results for Jonathan, Mercado (MRN [id number]) as of 01/24/2018 16:10  Ref. Range 01/13/2018 19:08 01/14/2018 10:48 01/17/2018 17:28  Anti Nuclear Antibody(ANA) Latest Ref Range: Negative  Negative    ANA Ab, IFA Unknown Positive (A)    CCP Antibodies IgG/IgA Latest Ref Range: 0 - 19 units 8 8   ds DNA Ab Latest Ref Range: 0 - 9 IU/mL  <1   RA Latex Turbid. Latest Ref  Range: <14 IU/mL   14 (H)  ENA SM Ab Ser-aCnc Latest Ref Range: 0.0 - 0.9 AI  <0.2   Homogeneous Pattern Unknown 1:160 (H)    NOTE: Unknown Comment    Ribonucleic Protein Latest Ref Range: 0.0 - 0.9 AI  0.4   SSA (Ro) (ENA) Antibody, IgG Latest Ref Range: 0.0 - 0.9 AI  <0.2   SSB (La) (ENA) Antibody, IgG Latest Ref Range: 0.0 - 0.9 AI  <0.2   Scleroderma (Scl-70) (ENA) Antibody, IgG Latest Ref Range: 0.0 - 0.9 AI  <0.2   Results for Jonathan, Mercado (MRN [id number]) as of 01/24/2018 16:10  Ref. Range 01/14/2018 04:40  Sed Rate Latest Ref Range: 0 - 16 mm/hr 22 (H)    Past Medical History  Prostate cancer (currently on coumadin but has been coumadin failure), PE, possible COPD and newly identified pulmonary fibrosis.   Significant Hospital Events   01/18/2018 - admit 01/20/18 -   Some improvement in O2 needs, resp status on steroids and also after diuretics (negative balance) New A fib   12/16 - wife at bedside. On 2-3L [place]. Worked with PT. Wife says that he he was saved from sepsis in 2011 by [place].   12/17 - goals of care with DR Byrum over phone  Consults:  Pulmonary  Procedures:    Significant Diagnostic Tests:  CT imaging of C-spine, maxillofacial and head.  All negative for acute fracture or trauma  Micro Data:   Antimicrobials:    Interim history/subjective:    12/18 - desaturated while on 3L to 82% walking 30 feet and dyspnea 3 of 4. Recovers with rest. SNF recommended. Wheelchair mobility and rolling walker needed noted, Wife upset that patient not going to CIR. Abnormal esophagus noted CT 01/12/18 but reported as normal in HRCT 01/25/18  Which is probable UIP on 2018 ATS criteria  Per patient/wife - 2 months ago very functional and driving care. Was never on on o2 but probably for few years insidious dyspnea that is progressive. But o2 need and decomp and ecog 4 is all in last 3 weeks   Wife reports lot of GERD  Objective   Blood pressure 120/87, pulse (!) 103,  temperature 97.8 F (36.6 C), resp. rate (!) 34, height 6' (1.829 m), weight 80.3 kg, SpO2 95 %.        Intake/Output Summary (Last 24 hours) at 01/26/2018 1632 Last data filed at 01/26/2018 0900 Gross per 24 hour  Intake 240 ml  Output 925 ml  Net -685 ml   Filed Weights   01/19/18 1015  Weight: 80.3 kg    General Appearance:  Lfrail, emaciated in bed 2L [place] Head:  Normocephalic, without obvious abnormality, atraumatic Eyes:  PERRL - yes, conjunctiva/corneas - clear     Ears:  Normal external ear canals, both ears Nose:  G tube - no Throat:  ETT TUBE - no , OG tube - no Neck:  Supple,  No enlargement/tenderness/nodules Lungs: Clear to auscultation bilaterally, but at base with crackles Heart:  S1 and S2 normal, no murmur, CVP - no.  Pressors - no Abdomen:  Soft, no masses, no organomegaly Genitalia / Rectal:  Not done Extremities:  Extremities- intact Skin:  ntact in exposed areas .  Neurologic:  Sedation - none -> RASS - +1 . Moves all 4s - yes. CAM-ICU - neg . Orientation - x3+        LABS    PULMONARY No results for input(s): PHART, PCO2ART, PO2ART, HCO3, TCO2, O2SAT in the last 168 hours.  Invalid input(s): PCO2, PO2  CBC Recent Labs  Lab 01/24/18 0225 01/26/18 0307  HGB 12.2* 12.5*  HCT 38.8* 38.2*  WBC 26.9* 24.8*  PLT 190 301    COAGULATION Recent Labs  Lab 01/20/18 0246 01/21/18 0229 01/22/18 0246 01/23/18 0606 01/24/18 0225  INR 4.26* 2.93 2.38 1.56 1.57    CARDIAC  No results for input(s): TROPONINI in the last 168 hours. No results for input(s): PROBNP in the last 168 hours.   CHEMISTRY Recent Labs  Lab 01/20/18 0246 01/21/18 0229 01/24/18 0225 01/26/18 0307  NA 135 136 134* 132*  K 4.9 4.6 4.9 4.9  CL 99 98 99 96*  CO2 24 26 23 25   GLUCOSE 323* 235* 192* 244*  BUN 54* 51* 34* 46*  CREATININE 1.71* 1.52* 1.29* 1.55*  CALCIUM 8.1* 8.1* 7.9* 8.1*  MG  --   --  2.3  --   PHOS  --   --  3.2  --    Estimated Creatinine  Clearance: 44.5 mL/min (A) (by C-G formula based on SCr of 1.55 mg/dL (H)).   LIVER Recent Labs  Lab 01/20/18 0246 01/21/18 0229 01/22/18 0246 01/23/18 0606 01/24/18 0225 01/26/18 0307  AST  --   --   --   --   --  20  ALT  --   --   --   --   --  21  ALKPHOS  --   --   --   --   --  63  BILITOT  --   --   --   --   --  0.5  PROT  --   --   --   --   --  5.8*  ALBUMIN  --   --   --   --  2.2* 2.1*  INR 4.26* 2.93 2.38 1.56 1.57  --      INFECTIOUS No results for input(s): LATICACIDVEN, PROCALCITON in the last 168 hours.   ENDOCRINE CBG (last 3)  Recent Labs    01/25/18 2120 01/26/18 0748 01/26/18 1233  GLUCAP 215* 225* 218*        CT chest angio 01/12/18 - personally visualzied on 01/24/18 Ct Chest High Resolution  Result Date: 01/25/2018 CLINICAL DATA:  77 year old male with history of interstitial lung disease. Follow-up study. EXAM: CT CHEST WITHOUT CONTRAST TECHNIQUE: Multidetector CT imaging of the chest was performed following the standard protocol without intravenous contrast. High resolution imaging of the lungs, as well as inspiratory and expiratory imaging, was performed. COMPARISON:  Chest CT 01/12/2018. FINDINGS: Cardiovascular: Heart size is mildly enlarged with biatrial dilatation. There is no significant pericardial fluid, thickening or pericardial calcification. There is aortic atherosclerosis, as well as atherosclerosis of the great vessels of the mediastinum and the coronary arteries, including calcified atherosclerotic plaque in the left main, left anterior descending, left circumflex and right coronary arteries. Calcifications of the aortic valve. Mediastinum/Nodes: No pathologically enlarged mediastinal or hilar lymph nodes. Please note that accurate exclusion of hilar adenopathy is limited on noncontrast CT scans. Esophagus is unremarkable in appearance. No axillary lymphadenopathy. Lungs/Pleura: Today's study is limited by considerable patient  respiratory motion. This particularly affects high-resolution imaging of the lungs. With these limitations in mind, high-resolution images demonstrate patchy areas of ground-glass attenuation, septal thickening, thickening of the peribronchovascular interstitium and regional areas of cylindrical bronchiectasis, most evident throughout the mid to lower lungs bilaterally. Compared to the prior study there are increasing areas of more frank peribronchovascular airspace consolidation, most severe in the right lower lobe, which could reflect an acute infection. No definitive honeycombing is confidently identified on today's examination. Inspiratory and expiratory imaging is remarkable for mild air trapping, indicative of mild small airways disease. Trace right pleural effusion. No pneumothorax. Upper Abdomen: Aortic atherosclerosis. Musculoskeletal: There are no aggressive appearing lytic or blastic lesions noted in the visualized portions of the skeleton. Multiple old healed rib fractures. In addition, there are acute minimally displaced fractures of the anterolateral aspect of the right fifth and sixth ribs. Status post left shoulder hemiarthroplasty. IMPRESSION: 1. Today's study is severely limited by extensive patient respiratory motion. 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 2. In addition, there is more focal airspace consolidation in the right lower lobe, concerning for an acute bronchopneumonia. 3. Mildly displaced acute fractures of the anterolateral aspect of the right fifth and sixth ribs. No associated pneumothorax. Trace right pleural effusion. 4. Aortic atherosclerosis, in addition to left main and 3 vessel coronary artery disease. Assessment for potential risk factor modification, dietary therapy or pharmacologic therapy may be warranted, if clinically indicated. 5. There are calcifications of  the aortic valve. Echocardiographic correlation for evaluation of potential valvular dysfunction may be warranted if clinically indicated. 6. Cardiomegaly with biatrial dilatation Aortic Atherosclerosis (ICD10-I70.0). Electronically Signed   By: Mauri Brooklyn.D.  On: 01/25/2018 09:54      Resolved Hospital Problem list     Assessment & Plan:  Acute on chronic hypoxemic resp failure  - persistent . Needing 2-3L [place]  - recent 12//19 - small PE (on anticoag) - underlying ILD NOS - serology negative - noted 01/12/18 (ESR 22 on 01/14/18)  - HRCT 12/17 - PROBABLE UIP per 2018 ATS Criteria - strong hx of GERD ( associated esophageal abnormality on CT 01/12/18 but normal on CT 01/25/18); - Very deconditioned   Clinical Dx is HIGH PROB IPF with IPF Flare up  Plan Continue IV solumedrol - chagne to po prednisone 29m 01/27/18 and taper and stop over 3 weeks (triad to write this out) Continue o2 Agree with rehab OPD start with ESBRIET (after titration do max dose of 2 pills tid with food) - will start during opd visit - lower dose due to frailty and ofev 2nd line due to coumadin  Followup   - below    Future Appointments  Date Time Provider [place] 02/07/2018 10:00 AM WMartyn Ehrich NP [facility] None  02/11/2018  9:15 AM Tat, REustace Quail DO [facility] None  03/15/2018  2:30 PM RBrand Males MD [facility] None  10/26/2018  8:00 AM PEustace Pen LPN [facility] PEC  [id number]:00 AM BJinny Sanders MD [facility]     Best practice:  Goals - agree with DNR,. If declines faster than we can get an anti-fibrotic on him or despite one then hospice would be appropriate. Long discussion about ILD/IPF - see below for documentation    > 50% of this > 40 min visit spent in face to face counseling or/and coordination of care - by this undersigned MD - Dr MBrand Males This includes one or more of the following documented above: discussion of test results, diagnostic  or treatment recommendations, prognosis, risks and benefits of management options, instructions, education, compliance or risk-factor reduction   SIGNATURE    Dr. MBrand Males M.D., F.C.C.P,  Pulmonary and Critical Care Medicine Staff Physician, C[place]burgDirector - Interstitial Lung Disease  Program  Pulmonary [place] [address] Pager: [phone] If no answer or between  15:00h - 7:00h: call [phone] Telephone: [phone]  5:58 PM 01/26/2018  ............................................[name]  Re IPF - discussed the following - Course   - progressive disease in almost all patients (> 90%); typically few to several year progression   - super unlukcy 10% - progress to death in a year   - super lucky < 10% - stability > 5 years or even rarely 10 years   -unpredictable in each individual - Rx:  anti-fibrotics + since 2014 but they can only slow disease down; preventative. Not Rx symptoms  - they have side effects including intolerance is < 1/3rd patients  - most 55-65% patients tolerate them well  - further details below - Other pillars of management  - Symptoms - cough and dyspnea RX  - O2 - definitely helps symptoms  - Rehab - definitely helps symptoms and conditioning  - Transplant - age < 726 good social support, BMI < 26 and $15,000 in cash  - Pulmonary Trials: highly encourage participation through [employer]  - Patient Support Group    - hhttp://[place].com/\   - hhttps://smith-[place].net/  Anti-fibrotic Drugs Both drugs OFEV and Esbriet only slow down progression, 1 out of 6  patients  - this means extension in quality of life but no difference in symptoms  - no study directly compares the 2 drugs but efficacy roughly equal at 1 year time point -  preferred esbriet in him with max dose of 2 pills tid

## 2018-01-26 NOTE — Progress Notes (Signed)
Palliative: Chart reviewed, goals in place for rehab. Conversation with case management related to disposition plan. PMT will continue to shadow chart. No charge [name], NP Palliative Medicine Team [phone] Greater than 50% of this time was spent counseling and coordinating care related to the above assessment and plan

## 2018-01-26 NOTE — Telephone Encounter (Addendum)
Jonathan  [name] has appt with with you coming up end of this month as post hospital followup. Dx is new IPF with IPF flare.  For you -> ensure pred taper per my note 01/26/18 and hospital dc and for you to start esbriet but titrate only upto 2 pills tid. My followup after you is already set up early feb 2020     SIGNATURE    Dr. Brand Males, M.D., F.C.C.P,  Pulmonary and Critical Care Medicine Staff Physician, [place] Director - Interstitial Lung Disease  Program  Pulmonary [place] [facility] [place], [place], [address]  Pager: [phone], If no answer or between  15:00h - 7:00h: call [phone] Telephone: [phone]  5:53 PM 01/26/2018

## 2018-01-27 LAB — COMPREHENSIVE METABOLIC PANEL
ALT: 19 U/L (ref 0–44)
AST: 19 U/L (ref 15–41)
Albumin: 2.1 g/dL — ABNORMAL LOW (ref 3.5–5.0)
Alkaline Phosphatase: 62 U/L (ref 38–126)
Anion gap: 13 (ref 5–15)
BUN: 46 mg/dL — ABNORMAL HIGH (ref 8–23)
CO2: 23 mmol/L (ref 22–32)
Calcium: 8 mg/dL — ABNORMAL LOW (ref 8.9–10.3)
Chloride: 97 mmol/L — ABNORMAL LOW (ref 98–111)
Creatinine, Ser: 1.55 mg/dL — ABNORMAL HIGH (ref 0.61–1.24)
GFR calc Af Amer: 50 mL/min — ABNORMAL LOW (ref >60)
GFR calc non Af Amer: 43 mL/min — ABNORMAL LOW (ref >60)
Glucose, Bld: 227 mg/dL — ABNORMAL HIGH (ref 70–99)
Potassium: 4.7 mmol/L (ref 3.5–5.1)
Sodium: 133 mmol/L — ABNORMAL LOW (ref 135–145)
Total Bilirubin: 0.8 mg/dL (ref 0.3–1.2)
Total Protein: 5.6 g/dL — ABNORMAL LOW (ref 6.5–8.1)

## 2018-01-27 LAB — GLUCOSE, CAPILLARY
GLUCOSE-CAPILLARY: 177 mg/dL — AB (ref 70–99)
Glucose-Capillary: 218 mg/dL — ABNORMAL HIGH (ref 70–99)
Glucose-Capillary: 127 mg/dL — ABNORMAL HIGH (ref 70–99)
Glucose-Capillary: 59 mg/dL — ABNORMAL LOW (ref 70–99)
Glucose-Capillary: 99 mg/dL (ref 70–99)

## 2018-01-27 LAB — CBC WITH DIFFERENTIAL/PLATELET
Abs Immature Granulocytes: 0 10*3/uL (ref 0.00–0.07)
BASOS ABS: 0 10*3/uL (ref 0.0–0.1)
Basophils Relative: 0 %
Eosinophils Absolute: 0 10*3/uL (ref 0.0–0.5)
Eosinophils Relative: 0 %
HCT: 37.5 % — ABNORMAL LOW (ref 39.0–52.0)
HEMOGLOBIN: 12.3 g/dL — AB (ref 13.0–17.0)
Lymphocytes Relative: 3 %
Lymphs Abs: 0.8 10*3/uL (ref 0.7–4.0)
MCH: 29.6 pg (ref 26.0–34.0)
MCHC: 32.8 g/dL (ref 30.0–36.0)
MCV: 90.4 fL (ref 80.0–100.0)
Monocytes Absolute: 2.3 10*3/uL — ABNORMAL HIGH (ref 0.1–1.0)
Monocytes Relative: 9 %
Neutro Abs: 22.7 10*3/uL — ABNORMAL HIGH (ref 1.7–7.7)
Neutrophils Relative %: 88 %
Platelets: 292 10*3/uL (ref 150–400)
RBC: 4.15 MIL/uL — ABNORMAL LOW (ref 4.22–5.81)
RDW: 13.5 % (ref 11.5–15.5)
WBC: 25.8 10*3/uL — ABNORMAL HIGH (ref 4.0–10.5)
nRBC: 0 % (ref 0.0–0.2)
nRBC: 0 /100 WBC

## 2018-01-27 LAB — MAGNESIUM: Magnesium: 2.4 mg/dL (ref 1.7–2.4)

## 2018-01-27 MED ORDER — BOOST / RESOURCE BREEZE PO LIQD CUSTOM: 1.0000 | Freq: Three times a day (TID) | ORAL | Status: DC

## 2018-01-27 MED ORDER — PRO-STAT SUGAR FREE PO LIQD
30.0000 mL | Freq: Two times a day (BID) | ORAL | Status: DC
  Administered 2018-01-29: 30 mL via ORAL
  Filled 2018-01-27 (×4): qty 30

## 2018-01-27 MED ORDER — PREDNISONE 20 MG PO TABS
60.0000 mg | ORAL_TABLET | Freq: Every day | ORAL | Status: DC
  Administered 2018-01-27 – 2018-01-29 (×3): 60 mg via ORAL
  Filled 2018-01-27 (×3): qty 3

## 2018-01-27 MED ORDER — MAGIC MOUTHWASH W/LIDOCAINE
5.0000 mL | Freq: Three times a day (TID) | ORAL | Status: DC
  Administered 2018-01-27: 5 mL via ORAL

## 2018-01-27 MED ORDER — MAGIC MOUTHWASH W/LIDOCAINE
15.0000 mL | Freq: Three times a day (TID) | ORAL | Status: DC
  Administered 2018-01-27 – 2018-01-28 (×3): 15 mL via ORAL
  Filled 2018-01-27 (×5): qty 15

## 2018-01-27 MED ORDER — FLUCONAZOLE 100 MG PO TABS
200.0000 mg | ORAL_TABLET | Freq: Every day | ORAL | Status: DC
  Administered 2018-01-28 – 2018-01-29 (×2): 200 mg via ORAL
  Filled 2018-01-27 (×2): qty 2

## 2018-01-27 MED ORDER — ACETAMINOPHEN 160 MG/5ML PO SOLN
500.0000 mg | Freq: Three times a day (TID) | ORAL | Status: DC
  Administered 2018-01-27 – 2018-01-29 (×5): 500 mg via ORAL
  Filled 2018-01-27 (×5): qty 20.3

## 2018-01-27 MED ORDER — PREDNISONE 20 MG PO TABS: 50.0000 mg | ORAL_TABLET | Freq: Every day | ORAL | Status: DC

## 2018-01-27 NOTE — Progress Notes (Signed)
Patient is c/o being short of breath, asking for the oxygen to be turned up.  He also is complaining of his right sided rib pain and asking for some heartburn medication.  He declined his Magic Mouthwash earlier but will take it now.  He was given 1 Norco for the pain and respiratory paged for albuterol neb to see if that will help with his feeling short of breath.  VSS as entered.  Lung sounds have been diminished with fine and course crackles in Right lower lobe.

## 2018-01-27 NOTE — Progress Notes (Signed)
Pt refusing svn tx, states the tx makes his mouth sore and swells his tongue.  RN aware

## 2018-01-27 NOTE — Progress Notes (Signed)
ANTICOAGULATION CONSULT NOTE - Initial Consult  Pharmacy Consult for Lovenox Indication: atrial fibrillation and pulmonary embolus (dx 12/4)  Allergies  Allergen Reactions  . Sulfacetamide Sodium Swelling    throat swelling  . Latex Itching    Severe contact dermatitis  . Adhesive [Tape] Other (See Comments)    Tears skin-- "No tape of any kind, they all tear skin right off"    Patient Measurements: Height: 6' (182.9 cm) Weight: 177 lb 0.5 oz (80.3 kg) IBW/kg (Calculated) : 77.6 Heparin Dosing Weight: 80 kg  Vital Signs: Temp: 98.6 F (37 C) (12/19 0849) Temp Source: Oral (12/19 0849) BP: 130/82 (12/19 0849) Pulse Rate: 91 (12/19 0849)  Labs: Recent Labs    01/26/18 0307 01/27/18 0553  HGB 12.5* 12.3*  HCT 38.2* 37.5*  PLT 301 292  CREATININE 1.55* 1.55*    Estimated Creatinine Clearance: 44.5 mL/min (A) (by C-G formula based on SCr of 1.55 mg/dL (H)).   Medical History: Past Medical History:  Diagnosis Date  . Arthritis    hands   . Asthma   . Chronic atrial fibrillation    a.fib on warfarin- Dr. Percival Spanish follows  . Diverticulosis of colon (without mention of hemorrhage)   . ED (erectile dysfunction)   . Fibromyalgia   . GERD (gastroesophageal reflux disease)   . Hard of hearing    wears hearing aids  . Headache   . History of blood clots    behind right knee and then went into left lung 86yrs ago  . History of colon polyps   . History of kidney stones   . Hx of diabetes mellitus    "no longer diabetic since gastric bypass" - no longer taking metformin"  in 2 months "problems with low blood sugar now"  . Hx of gout    but doesn't take any meds  . Inflammation of colonic mucosa    recent admission and release from [facility] 02-28-15-remains on oral antibiotic  . IPF (idiopathic pulmonary fibrosis) ([place])   . Obesity   . Peripheral vascular disease ([place])   . Pneumonia    last time about 53yrs ago  . Prostate cancer ([place])   . Pulmonary embolism ([place])     over 10 yrs ago "many years ago"  . Restless legs syndrome (RLS)   . Skin cancer   . Unspecified asthma(493.90)    as a child  . Unspecified essential hypertension    hx of no longer on medication due to gastric bypass     Medications:  Medications Prior to Admission  Medication Sig Dispense Refill Last Dose  . acarbose (PRECOSE) 50 MG tablet Take 50 mg by mouth 3 (three) times daily with meals.   01/17/2018 at Unknown time  . atorvastatin (LIPITOR) 80 MG tablet Take 1 tablet (80 mg total) by mouth at bedtime. 90 tablet 3 01/17/2018 at Unknown time  . benzonatate (TESSALON) 200 MG capsule Take 1 capsule (200 mg total) by mouth 3 (three) times daily as needed for cough. 20 capsule 0 01/17/2018 at Unknown time  . chlorpheniramine-HYDROcodone (TUSSIONEX) 10-8 MG/5ML SUER Take 5 mLs by mouth every 12 (twelve) hours as needed for cough. (Patient taking differently: Take 1.5 mLs by mouth every 12 (twelve) hours as needed for cough. ) 140 mL 0 Past Week at Unknown time  . FLUoxetine (PROZAC) 20 MG capsule TAKE 3 CAPSULES BY MOUTH EVERYDAY AT BEDTIME (Patient taking differently: Take 60 mg by mouth at bedtime. ) 270 capsule 1 01/17/2018 at Unknown  time  . furosemide (LASIX) 20 MG tablet Take 1 tablet (20 mg total) by mouth every other day. (Patient taking differently: Take 20 mg by mouth daily. ) 180 tablet 1 01/17/2018 at Unknown time  . gabapentin (NEURONTIN) 300 MG capsule TAKE 1 CAPSULE BY MOUTH TWICE A DAY (Patient taking differently: Take 300 mg by mouth 2 (two) times daily. ) 180 capsule 1 01/17/2018 at Unknown time  . hydrocortisone 2.5 % cream Apply 1 application topically 2 (two) times daily as needed (itching).   2 Past Week at Unknown time  . hydroxypropyl methylcellulose / hypromellose (ISOPTO TEARS / GONIOVISC) 2.5 % ophthalmic solution Place 1 drop into both eyes 2 (two) times daily as needed for dry eyes.   Past Week at Unknown time  . Omega-3 Fatty Acids (FISH OIL) 1000 MG CAPS Take 1,000  mg by mouth daily.   01/17/2018 at Unknown time  . omeprazole (PRILOSEC) 40 MG capsule TAKE 1 CAPSULE (40 MG TOTAL) BY MOUTH DAILY. (Patient taking differently: Take 40 mg by mouth at bedtime. ) 90 capsule 1 01/17/2018 at Unknown time  . predniSONE (DELTASONE) 10 MG tablet 4 tabs x 4 days, 3 tabs x 4 days; 2 tabs x 4 days; 1 tab x 4 days 40 tablet 0 01/17/2018 at Unknown time  . PRESCRIPTION MEDICATION Apply 1 g topically 4 (four) times daily as needed (pain). Baclofen/Gabapentin/Lidocaine Compund Cream   01/17/2018 at Unknown time  . primidone (MYSOLINE) 50 MG tablet 150 mg in the morning, 100 mg at night (Patient taking differently: Take 100-150 mg by mouth See admin instructions. 150 mg in the morning, 100 mg at night) 450 tablet 1 01/17/2018 at Unknown time  . ranitidine (ZANTAC) 300 MG tablet Take 300 mg by mouth at bedtime.   3 01/17/2018 at Unknown time  . sucralfate (CARAFATE) 1 g tablet Take 1 g by mouth 3 (three) times daily before meals.   6 01/18/2018 at Unknown time  . tamsulosin (FLOMAX) 0.4 MG CAPS capsule Take 0.4 mg by mouth at bedtime.    01/17/2018 at Unknown time  . tiZANidine (ZANAFLEX) 4 MG tablet Take 4 mg by mouth every 6 (six) hours as needed for muscle spasms.    Past Month at Unknown time  . vitamin E 400 UNIT capsule Take 400 Units by mouth daily.   01/17/2018 at Unknown time  . warfarin (COUMADIN) 2.5 MG tablet Take 2.5 mg by mouth daily. Take 2.5mg  SUN and THUR and 3.75mg  on all other days.   01/17/2018 at 2000  . zolpidem (AMBIEN CR) 12.5 MG CR tablet Take 12.5 mg by mouth at bedtime.   01/17/2018 at Unknown time  . ipratropium-albuterol (DUONEB) 0.5-2.5 (3) MG/3ML SOLN Take 3 mLs by nebulization every 6 (six) hours as needed. (Patient taking differently: Take 3 mLs by nebulization every 6 (six) hours as needed (shortness of breath). ) 360 mL 0     Assessment: 77 yo M with recent admission 12/4 > 12/8 for mgmt of pulm disease.  Noted at that time to have new PE on CT scan.  Pt  was on Coumadin with therapeutic INR at that time but declined LMWH and was unable to afford Eliqus/Xarelto.   He will be on chronic lovenox due to failing warfarin and cost limitation with DOACs. Scr 1.55>>crcl~45. Hgb 12.3, plt 292.   Goal of Therapy:  Heparin level 1-2 Monitor platelets by anticoagulation protocol: Yes    Plan:   Lovenox 120mg  SQ q24 CBC q3day  Onnie Boer, PharmD, BCIDP, AAHIVP, CPP Infectious Disease Pharmacist 01/27/2018 8:50 AM

## 2018-01-27 NOTE — Progress Notes (Signed)
OT Cancellation Note  Patient Details Name: NEVADA MULLETT MRN: [id number] DOB: 1940-04-25   Cancelled Treatment:    Reason Eval/Treat Not Completed: Fatigue/lethargy limiting ability to participate;Pain limiting ability to participate. Attempted 2x to see pt today. This morning pt was in pain, planned to see after premedicated. Second attempt, pt asleep in bed, spouse reporting he had a difficult time transferring to/from [facility] and was wiped out and in pain again after that. Pt is just now able to rest per spouse, "He just got settled down into the bed and sleeping." Plan to reattempt tomorrow.   Tyrone Schimke, OT Acute Rehabilitation Services Pager: [phone] Office: [phone]  01/27/2018, 1:15 PM

## 2018-01-27 NOTE — Care Management Important Message (Signed)
Important Message  Patient Details  Name: [name] MRN: [id number] Date of Birth: [birth year]   Medicare Important Message Given:  Yes    Orbie Pyo 01/27/2018, 2:14 PM

## 2018-01-27 NOTE — Progress Notes (Signed)
[facility] TEAM 1 - Stepdown/ICU TEAM  LUCY WOOLEVER  OIZ:[id number] DOB: January 15, 1941 DOA: 01/18/2018 PCP: Jinny Sanders, MD    Brief Narrative:  77 year old male w/ a hx of HTN, PE, PVD, DM2,atrial fibrillation, gastric bypass, chronic hypoxic respiratory failure due to presumed interstitial lung disease, and prostate cancer who was seen at the pulmonary clinic after a recent discharge from [facility], placedon steroids, and advised to increase hissupplemental oxygen. He presented to the ED with worsening weakness, mechanical fall, and respiratory distress.    Subjective: Pain in the mouth is somewhat better.  No nausea no vomiting.  Oral intake is improving with near his hydration.  Assessment & Plan:  Acute on chronic hypoxemic respiratory failure / underlying ILD NOS high dose systemic steroids - follow with the pulmonary fibrosis/ILD clinic on discharge - med tx per PCCM -high-resolution CT scan consistent with UIP. PCCM recommends transitioning to oral steroids tomorrow. Outpatient follow-up recommended in 1 week on discharge.  Chronic Afib w/ acute RVR Heart rate well controlled -already anticoagulated  Hx of Pulmonary embolism Recurred while therapeutic on warfarin - PCCM suggests enoxaparin long-term as the patient cannot afford DOAC  Coag negative staph in blood culture 1 of 2 -clinically most consistent with a contaminant -no clinical evidence to suggest active infection  Asymmetric irregular wall thickening noted distal esophagus Appreciated on CTangio chest 01/12/18 during prior admit- distal esophageal neoplasm a concern - will need EGD when resp status more stable -discussed finding with patient and wife at bedside -wife reports a very long history of severe reflux and multiple prior EGDs -currently patient's respiratory status is not stable enough to allow a repeat EGD -this will need to be pursued in the outpatient setting as soon as the patient's  respiratory status stabilizes  DM2 with hyperglycemia CBG remains poorly controlled - adjust tx again today and follow trend   AKI on CKD stage 3 Creatinine was improving up until 01/26/2018.  Now worsening again. We will hold off on Lopressor to allow blood pressure to run a little higher.  Recheck tomorrow.  Likely from poor p.o. intake.  Prostate Cancer Follow-up with the urologist and primary care provider on discharge -asymptomatic  Oral candidiasis with possible candidal esophagitis: Due to prolonged use of steroids -Diflucan dose increased. Magic mouthwash.  DVT prophylaxis: lovenox  Code Status: DNR - NO CODE Family Communication: Spoke with wife at bedside Disposition Plan: CIR denied - for SNF when stable - probable d/c to SNF 12/20 if clinically stable (very dyspneic presently)   Consultants:  PCCM Palliative Care   Antimicrobials:  none  Objective: Blood pressure 101/78, pulse 87, temperature 98.1 F (36.7 C), temperature source Oral, resp. rate 19, height 6' (1.829 m), weight 80.3 kg, SpO2 91 %.  Intake/Output Summary (Last 24 hours) at 01/27/2018 2130 Last data filed at 01/27/2018 0327 Gross per 24 hour  Intake -  Output 800 ml  Net -800 ml   Filed Weights   01/19/18 1015  Weight: 80.3 kg    Examination: General: dyspnea at rest persists - able to complete full sentences  Lungs: Fine crackles diffusely - no wheezing  Cardiovascular: Irregularly irregular - no M or rub  Abdomen: NT/ND, soft, BS+, no mass  Extremities: No signif edema bilateral lower extremities  CBC: Recent Labs  Lab 01/24/18 0225 01/26/18 0307 01/27/18 0553  WBC 26.9* 24.8* 25.8*  NEUTROABS 24.8*  --  22.7*  HGB 12.2* 12.5* 12.3*  HCT 38.8* 38.2* 37.5*  MCV 90.7 90.3 90.4  PLT 190 301 998   Basic Metabolic Panel: Recent Labs  Lab 01/24/18 0225 01/26/18 0307 01/27/18 0553  NA 134* 132* 133*  K 4.9 4.9 4.7  CL 99 96* 97*  CO2 23 25 23   GLUCOSE 192* 244* 227*    BUN 34* 46* 46*  CREATININE 1.29* 1.55* 1.55*  CALCIUM 7.9* 8.1* 8.0*  MG 2.3  --  2.4  PHOS 3.2  --   --    GFR: Estimated Creatinine Clearance: 44.5 mL/min (A) (by C-G formula based on SCr of 1.55 mg/dL (H)).  Liver Function Tests: Recent Labs  Lab 01/24/18 0225 01/26/18 0307 01/27/18 0553  AST  --  20 19  ALT  --  21 19  ALKPHOS  --  63 62  BILITOT  --  0.5 0.8  PROT  --  5.8* 5.6*  ALBUMIN 2.2* 2.1* 2.1*    Coagulation Profile: Recent Labs  Lab 01/21/18 0229 01/22/18 0246 01/23/18 0606 01/24/18 0225  INR 2.93 2.38 1.56 1.57    HbA1C: Hgb A1c MFr Bld  Date/Time Value Ref Range Status  10/14/2017 07:52 AM 6.6 (H) 4.6 - 6.5 % Final    Comment:    Glycemic Control Guidelines for People with Diabetes:Non Diabetic:  <6%Goal of Therapy: <7%Additional Action Suggested:  >8%   04/16/2017 10:02 AM 7.9 (H) 4.8 - 5.6 % Final    Comment:    (NOTE)         Prediabetes: 5.7 - 6.4         Diabetes: >6.4         Glycemic control for adults with diabetes: <7.0     CBG: Recent Labs  Lab 01/26/18 1702 01/26/18 2107 01/27/18 0851 01/27/18 1150 01/27/18 1810  GLUCAP 193* 108* 218* 177* 127*    Recent Results (from the past 240 hour(s))  Blood culture (routine x 2)     Status: Abnormal   Collection Time: 01/18/18  2:58 PM  Result Value Ref Range Status   Specimen Description BLOOD RIGHT ANTECUBITAL  Final   Special Requests   Final    BOTTLES DRAWN AEROBIC AND ANAEROBIC Blood Culture adequate volume   Culture  Setup Time   Final    AEROBIC BOTTLE ONLY GRAM POSITIVE COCCI Organism ID to follow CRITICAL RESULT CALLED TO, READ BACK BY AND VERIFIED WITH: A MEYER PHARMD 01/19/18 1855 JDW    Culture (A)  Final    STAPHYLOCOCCUS SPECIES (COAGULASE NEGATIVE) THE SIGNIFICANCE OF ISOLATING THIS ORGANISM FROM A SINGLE SET OF BLOOD CULTURES WHEN MULTIPLE SETS ARE DRAWN IS UNCERTAIN. PLEASE NOTIFY THE MICROBIOLOGY DEPARTMENT WITHIN ONE WEEK IF SPECIATION AND SENSITIVITIES  ARE REQUIRED. Performed at [facility], [place] [address], [place], [place] [address]    Report Status 01/21/2018 FINAL  Final  Blood Culture ID Panel (Reflexed)     Status: Abnormal   Collection Time: 01/18/18  2:58 PM  Result Value Ref Range Status   Enterococcus species NOT DETECTED NOT DETECTED Final   Listeria monocytogenes NOT DETECTED NOT DETECTED Final   Staphylococcus species DETECTED (A) NOT DETECTED Final    Comment: Methicillin (oxacillin) susceptible coagulase negative staphylococcus. Possible blood culture contaminant (unless isolated from more than one blood culture draw or clinical case suggests pathogenicity). No antibiotic treatment is indicated for blood  culture contaminants. CRITICAL RESULT CALLED TO, READ BACK BY AND VERIFIED WITH: A MEYER PHARMD 01/19/18 1855 JDW    Staphylococcus aureus (BCID) NOT DETECTED NOT DETECTED  Final   Methicillin resistance NOT DETECTED NOT DETECTED Final   Streptococcus species NOT DETECTED NOT DETECTED Final   Streptococcus agalactiae NOT DETECTED NOT DETECTED Final   Streptococcus pneumoniae NOT DETECTED NOT DETECTED Final   Streptococcus pyogenes NOT DETECTED NOT DETECTED Final   Acinetobacter baumannii NOT DETECTED NOT DETECTED Final   Enterobacteriaceae species NOT DETECTED NOT DETECTED Final   Enterobacter cloacae complex NOT DETECTED NOT DETECTED Final   Escherichia coli NOT DETECTED NOT DETECTED Final   Klebsiella oxytoca NOT DETECTED NOT DETECTED Final   Klebsiella pneumoniae NOT DETECTED NOT DETECTED Final   Proteus species NOT DETECTED NOT DETECTED Final   Serratia marcescens NOT DETECTED NOT DETECTED Final   Haemophilus influenzae NOT DETECTED NOT DETECTED Final   Neisseria meningitidis NOT DETECTED NOT DETECTED Final   Pseudomonas aeruginosa NOT DETECTED NOT DETECTED Final   Candida albicans NOT DETECTED NOT DETECTED Final   Candida glabrata NOT DETECTED NOT DETECTED Final   Candida krusei NOT DETECTED NOT DETECTED  Final   Candida parapsilosis NOT DETECTED NOT DETECTED Final   Candida tropicalis NOT DETECTED NOT DETECTED Final    Comment: Performed at [facility], [place] [address], [place], [place] [address]  Blood culture (routine x 2)     Status: None   Collection Time: 01/18/18  3:50 PM  Result Value Ref Range Status   Specimen Description BLOOD LEFT HAND  Final   Special Requests   Final    BOTTLES DRAWN AEROBIC AND ANAEROBIC Blood Culture results may not be optimal due to an inadequate volume of blood received in culture bottles   Culture   Final    NO GROWTH 5 DAYS Performed at [facility], [place] [address] [place], [place] [address]    Report Status 01/25/2018 FINAL  Final  MRSA PCR Screening     Status: None   Collection Time: 01/18/18  6:33 PM  Result Value Ref Range Status   MRSA by PCR NEGATIVE NEGATIVE Final    Comment:        The GeneXpert MRSA Assay (FDA approved for NASAL specimens only), is one component of a comprehensive MRSA colonization surveillance program. It is not intended to diagnose MRSA infection nor to guide or monitor treatment for MRSA infections. Performed at [facility], [place] [address] [place], [place] [address]   Urine culture     Status: None   Collection Time: 01/18/18  9:07 PM  Result Value Ref Range Status   Specimen Description URINE, CLEAN CATCH  Final   Special Requests NONE  Final   Culture   Final    NO GROWTH Performed at [facility], [place] [address], [place], [place] [address]    Report Status 01/20/2018 FINAL  Final     Scheduled Meds: . acetaminophen (TYLENOL) oral liquid 160 mg/5 mL  500 mg Oral TID  . atorvastatin  80 mg Oral QHS  . enoxaparin (LOVENOX) injection  120 mg Subcutaneous Q24H  . feeding supplement  1 Container Oral TID BM  . feeding supplement (PRO-STAT SUGAR FREE 64)  30 mL Oral BID  . [START ON 01/28/2018] fluconazole  200 mg Oral Daily  . FLUoxetine  60 mg Oral QHS  . insulin aspart  0-15  Units Subcutaneous TID WC  . insulin aspart  0-5 Units Subcutaneous QHS  . insulin detemir  12 Units Subcutaneous BID  . ipratropium-albuterol  3 mL Nebulization TID  . lidocaine  1 patch  Transdermal Q24H  . magic mouthwash w/lidocaine  15 mL Oral TID AC & HS  . pantoprazole  40 mg Oral Q1200  . predniSONE  60 mg Oral Q breakfast  . senna-docusate  1 tablet Oral BID  . sodium chloride flush  3 mL Intravenous Q12H      LOS: 9 days   Author:  Berle Mull, MD [facility] Pager: [phone] 01/27/2018 9:30 PM     If 7PM-7AM, please contact night-coverage per Amion 01/27/2018, 9:30 PM

## 2018-01-27 NOTE — Progress Notes (Signed)
Add scheduled APAP 500mg  TID after discussion on round.  Onnie Boer, PharmD, BCIDP, AAHIVP, CPP Infectious Disease Pharmacist 01/27/2018 11:20 AM

## 2018-01-27 NOTE — Telephone Encounter (Signed)
Thank you, much appreciated.

## 2018-01-28 ENCOUNTER — Ambulatory Visit: Payer: Medicare Other | Admitting: Cardiology

## 2018-01-28 ENCOUNTER — Ambulatory Visit: Payer: Medicare Other | Admitting: Neurology

## 2018-01-28 LAB — BASIC METABOLIC PANEL
Anion gap: 12 (ref 5–15)
BUN: 46 mg/dL — ABNORMAL HIGH (ref 8–23)
CO2: 24 mmol/L (ref 22–32)
Calcium: 7.9 mg/dL — ABNORMAL LOW (ref 8.9–10.3)
Chloride: 97 mmol/L — ABNORMAL LOW (ref 98–111)
Creatinine, Ser: 1.59 mg/dL — ABNORMAL HIGH (ref 0.61–1.24)
GFR calc Af Amer: 48 mL/min — ABNORMAL LOW (ref >60)
GFR calc non Af Amer: 42 mL/min — ABNORMAL LOW (ref >60)
Glucose, Bld: 123 mg/dL — ABNORMAL HIGH (ref 70–99)
Potassium: 4.6 mmol/L (ref 3.5–5.1)
SODIUM: 133 mmol/L — AB (ref 135–145)

## 2018-01-28 LAB — GLUCOSE, CAPILLARY
GLUCOSE-CAPILLARY: 112 mg/dL — AB (ref 70–99)
Glucose-Capillary: 188 mg/dL — ABNORMAL HIGH (ref 70–99)
Glucose-Capillary: 181 mg/dL — ABNORMAL HIGH (ref 70–99)
Glucose-Capillary: 169 mg/dL — ABNORMAL HIGH (ref 70–99)

## 2018-01-28 MED ORDER — HYDROCODONE-ACETAMINOPHEN 5-325 MG PO TABS
1.0000 | ORAL_TABLET | ORAL | Status: DC | PRN
  Administered 2018-01-29 (×3): 1 via ORAL
  Filled 2018-01-28 (×3): qty 1

## 2018-01-28 MED ORDER — SODIUM CHLORIDE 0.9 % IV BOLUS
1000.0000 mL | Freq: Once | INTRAVENOUS | Status: AC
  Administered 2018-01-28: 1000 mL via INTRAVENOUS

## 2018-01-28 MED ORDER — DILTIAZEM HCL 30 MG PO TABS
30.0000 mg | ORAL_TABLET | Freq: Three times a day (TID) | ORAL | Status: DC
  Administered 2018-01-28 (×2): 30 mg via ORAL
  Filled 2018-01-28 (×2): qty 1

## 2018-01-28 MED ORDER — OXYCODONE-ACETAMINOPHEN 5-325 MG PO TABS
1.0000 | ORAL_TABLET | ORAL | Status: DC | PRN
  Administered 2018-01-28 (×3): 1 via ORAL
  Filled 2018-01-28 (×3): qty 1

## 2018-01-28 NOTE — Progress Notes (Signed)
PT Cancellation Note  Patient Details Name: [name] MRN: [id number] DOB: Dec 20, 1940   Cancelled Treatment:     Attempted to see patient, wife spoke with me outside his door, stating that "theres no way he can do therapy today he is too weak and having a hard time breathing". Wife distressed at patients decline, RN notified.  Requests that I do not try. Will hold at this time.    Reinaldo Berber, PT, DPT Acute Rehabilitation Services Pager: [phone] Office: [phone]   Reinaldo Berber 01/28/2018, 10:15 AM

## 2018-01-28 NOTE — Clinical Social Work Placement (Signed)
   CLINICAL SOCIAL WORK PLACEMENT  NOTE  Date:  01/28/2018  Patient Details  Name: [name] MRN: [id number] Date of Birth: [birth year]  Clinical Social Work is seeking post-discharge placement for this patient at the [place] level of care (*CSW will initial, date and re-position this form in  chart as items are completed):      Patient/family provided with [facility] Work Department's list of facilities offering this level of care within the geographic area requested by the patient (or if unable, by the patient's family).  Yes   Patient/family informed of their freedom to choose among providers that offer the needed level of care, that participate in Medicare, Medicaid or managed care program needed by the patient, have an available bed and are willing to accept the patient.      Patient/family informed of Center's ownership interest in [facility] and [facility], as well as of the fact that they are under no obligation to receive care at these facilities.  PASRR submitted to EDS on       PASRR number received on       Existing PASRR number confirmed on       FL2 transmitted to all facilities in geographic area requested by pt/family on       FL2 transmitted to all facilities within larger geographic area on       Patient informed that his/her managed care company has contracts with or will negotiate with certain facilities, including the following:        Yes   Patient/family informed of bed offers received.  Patient chooses bed at [facility]     Physician recommends and patient chooses bed at      Patient to be transferred to [facility] on 01/29/18.  Patient to be transferred to facility by       Patient family notified on 01/29/18 of transfer.  Name of family member notified:        PHYSICIAN       Additional Comment:    _______________________________________________ Eileen Stanford, LCSW 01/28/2018, 10:14  AM

## 2018-01-28 NOTE — Progress Notes (Signed)
[name]  VOH:[id number] DOB: 10/03/1940 DOA: 01/18/2018 PCP: Jinny Sanders, MD    Brief Narrative:  77 year old male w/ a hx of HTN, PE, PVD, DM2,atrial fibrillation, gastric bypass, chronic hypoxic respiratory failure due to presumed interstitial lung disease, and prostate cancer who was seen at the pulmonary clinic after a recent discharge from [facility], placedon steroids, and advised to increase hissupplemental oxygen. He presented to the ED with worsening weakness, mechanical fall, and respiratory distress.    Subjective: Pain in the mouth is somewhat better.  No nausea no vomiting.  Oral intake is improving with near his hydration.  Assessment & Plan:  Acute on chronic hypoxemic respiratory failure / underlying ILD NOS high dose systemic steroids - follow with the pulmonary fibrosis/ILD clinic on discharge - med tx per PCCM -high-resolution CT scan consistent with UIP. PCCM recommends transitioning to oral steroids with a 3 weeks taper. Outpatient follow-up recommended in 1 week on discharge.  Acute rib fracture. Fall. Patient's initial reason for presentation was secondary to a fall. Other work-up were negative. X-ray chest on admission was also negative for any rib fracture. A CT scan of the chest was performed for further evaluation of his fibrosis which does show that the patient does have sustained 2 rib fractures without any pneumothorax or any other acute abnormality. Continue pain control with Percocet.  Chronic Afib w/ acute RVR Heart rate well controlled -already anticoagulated with Lovenox Patient was on metoprolol which was stopped due to hypotension and worsening renal function. Due to his respiratory distress would prefer calcium channel blocker or beta-blocker and will start the patient on low-dose Cardizem and monitor.  Holding parameters in place.  Hx of Pulmonary embolism Recurred while therapeutic on warfarin - PCCM suggests enoxaparin  long-term as the patient cannot afford DOAC  Coag negative staph in blood culture 1 of 2 -clinically most consistent with a contaminant -no clinical evidence to suggest active infection  Asymmetric irregular wall thickening noted distal esophagus Appreciated on CTangio chest 01/12/18 during prior admit- distal esophageal neoplasm a concern - will need EGD when resp status more stable -discussed finding with patient and wife at bedside -wife reports a very long history of severe reflux and multiple prior EGDs -currently patient's respiratory status is not stable enough to allow a repeat EGD -this will need to be pursued in the outpatient setting as soon as the patient's respiratory status stabilizes  Type 2 diabetes mellitus, uncontrolled with hyperglycemia with renal complication. CBG now well controlled, monitor.  Continue sliding scale and insulin regimen.  AKI on CKD stage 3 Creatinine was improving up until 01/26/2018.  Then worsening again. Likely from poor p.o. intake. Currently renal function has stabilized.  Monitor.  Prostate Cancer Follow-up with the urologist and primary care provider on discharge -asymptomatic  Oral candidiasis with possible candidal esophagitis: Mouth pain Due to prolonged use of steroids -Diflucan dose increased. Magic mouthwash.  DVT prophylaxis: lovenox  Code Status: DNR - NO CODE Family Communication: Spoke with wife at bedside Disposition Plan: CIR denied - for SNF when stable - probable d/c to SNF 12/20 if clinically stable (very dyspneic presently)   Consultants:  PCCM Palliative Care   Antimicrobials:  none  Objective: Blood pressure 127/84, pulse 85, temperature 97.9 F (36.6 C), temperature source Oral, resp. rate 20, height 6' (1.829 m), weight 80.3 kg, SpO2 97 %.  Intake/Output Summary (Last 24 hours) at 01/28/2018 1410 Last data filed at 01/28/2018 0645 Gross per 24 hour  Intake 1360 ml  Output 975 ml  Net 385 ml   Filed  Weights   01/19/18 1015  Weight: 80.3 kg    Examination: General: dyspnea at rest persists - able to complete full sentences  Lungs: Fine crackles diffusely - no wheezing  Cardiovascular: Irregularly irregular - no M or rub  Abdomen: NT/ND, soft, BS+, no mass  Extremities: No signif edema bilateral lower extremities  CBC: Recent Labs  Lab 01/24/18 0225 01/26/18 0307 01/27/18 0553  WBC 26.9* 24.8* 25.8*  NEUTROABS 24.8*  --  22.7*  HGB 12.2* 12.5* 12.3*  HCT 38.8* 38.2* 37.5*  MCV 90.7 90.3 90.4  PLT 190 301 294   Basic Metabolic Panel: Recent Labs  Lab 01/24/18 0225 01/26/18 0307 01/27/18 0553 01/28/18 0809  NA 134* 132* 133* 133*  K 4.9 4.9 4.7 4.6  CL 99 96* 97* 97*  CO2 23 25 23 24   GLUCOSE 192* 244* 227* 123*  BUN 34* 46* 46* 46*  CREATININE 1.29* 1.55* 1.55* 1.59*  CALCIUM 7.9* 8.1* 8.0* 7.9*  MG 2.3  --  2.4  --   PHOS 3.2  --   --   --    GFR: Estimated Creatinine Clearance: 43.4 mL/min (A) (by C-G formula based on SCr of 1.59 mg/dL (H)).  Liver Function Tests: Recent Labs  Lab 01/24/18 0225 01/26/18 0307 01/27/18 0553  AST  --  20 19  ALT  --  21 19  ALKPHOS  --  63 62  BILITOT  --  0.5 0.8  PROT  --  5.8* 5.6*  ALBUMIN 2.2* 2.1* 2.1*    Coagulation Profile: Recent Labs  Lab 01/22/18 0246 01/23/18 0606 01/24/18 0225  INR 2.38 1.56 1.57    HbA1C: Hgb A1c MFr Bld  Date/Time Value Ref Range Status  10/14/2017 07:52 AM 6.6 (H) 4.6 - 6.5 % Final    Comment:    Glycemic Control Guidelines for People with Diabetes:Non Diabetic:  <6%Goal of Therapy: <7%Additional Action Suggested:  >8%   04/16/2017 10:02 AM 7.9 (H) 4.8 - 5.6 % Final    Comment:    (NOTE)         Prediabetes: 5.7 - 6.4         Diabetes: >6.4         Glycemic control for adults with diabetes: <7.0     CBG: Recent Labs  Lab 01/27/18 1810 01/27/18 2136 01/27/18 2210 01/28/18 0740 01/28/18 1201  GLUCAP 127* 59* 99 112* 188*    Recent Results (from the past 240  hour(s))  Blood culture (routine x 2)     Status: Abnormal   Collection Time: 01/18/18  2:58 PM  Result Value Ref Range Status   Specimen Description BLOOD RIGHT ANTECUBITAL  Final   Special Requests   Final    BOTTLES DRAWN AEROBIC AND ANAEROBIC Blood Culture adequate volume   Culture  Setup Time   Final    AEROBIC BOTTLE ONLY GRAM POSITIVE COCCI Organism ID to follow CRITICAL RESULT CALLED TO, READ BACK BY AND VERIFIED WITH: A MEYER PHARMD 01/19/18 1855 JDW    Culture (A)  Final    STAPHYLOCOCCUS SPECIES (COAGULASE NEGATIVE) THE SIGNIFICANCE OF ISOLATING THIS ORGANISM FROM A SINGLE SET OF BLOOD CULTURES WHEN MULTIPLE SETS ARE DRAWN IS UNCERTAIN. PLEASE NOTIFY THE MICROBIOLOGY DEPARTMENT WITHIN ONE WEEK IF SPECIATION AND SENSITIVITIES ARE REQUIRED. Performed at [facility], [place] [address], [place], [place] [address]    Report Status 01/21/2018 FINAL  Final  Blood Culture ID Panel (Reflexed)     Status: Abnormal   Collection Time: 01/18/18  2:58 PM  Result Value Ref Range Status   Enterococcus species NOT DETECTED NOT DETECTED Final   Listeria monocytogenes NOT DETECTED NOT DETECTED Final   Staphylococcus species DETECTED (A) NOT DETECTED Final    Comment: Methicillin (oxacillin) susceptible coagulase negative staphylococcus. Possible blood culture contaminant (unless isolated from more than one blood culture draw or clinical case suggests pathogenicity). No antibiotic treatment is indicated for blood  culture contaminants. CRITICAL RESULT CALLED TO, READ BACK BY AND VERIFIED WITH: A MEYER PHARMD 01/19/18 1855 JDW    Staphylococcus aureus (BCID) NOT DETECTED NOT DETECTED Final   Methicillin resistance NOT DETECTED NOT DETECTED Final   Streptococcus species NOT DETECTED NOT DETECTED Final   Streptococcus agalactiae NOT DETECTED NOT DETECTED Final   Streptococcus pneumoniae NOT DETECTED NOT DETECTED Final   Streptococcus pyogenes NOT DETECTED NOT DETECTED Final   Acinetobacter  baumannii NOT DETECTED NOT DETECTED Final   Enterobacteriaceae species NOT DETECTED NOT DETECTED Final   Enterobacter cloacae complex NOT DETECTED NOT DETECTED Final   Escherichia coli NOT DETECTED NOT DETECTED Final   Klebsiella oxytoca NOT DETECTED NOT DETECTED Final   Klebsiella pneumoniae NOT DETECTED NOT DETECTED Final   Proteus species NOT DETECTED NOT DETECTED Final   Serratia marcescens NOT DETECTED NOT DETECTED Final   Haemophilus influenzae NOT DETECTED NOT DETECTED Final   Neisseria meningitidis NOT DETECTED NOT DETECTED Final   Pseudomonas aeruginosa NOT DETECTED NOT DETECTED Final   Candida albicans NOT DETECTED NOT DETECTED Final   Candida glabrata NOT DETECTED NOT DETECTED Final   Candida krusei NOT DETECTED NOT DETECTED Final   Candida parapsilosis NOT DETECTED NOT DETECTED Final   Candida tropicalis NOT DETECTED NOT DETECTED Final    Comment: Performed at [facility], [place]. [address], [place], [place] [address]  Blood culture (routine x 2)     Status: None   Collection Time: 01/18/18  3:50 PM  Result Value Ref Range Status   Specimen Description BLOOD LEFT HAND  Final   Special Requests   Final    BOTTLES DRAWN AEROBIC AND ANAEROBIC Blood Culture results may not be optimal due to an inadequate volume of blood received in culture bottles   Culture   Final    NO GROWTH 5 DAYS Performed at [facility], [place] [address] [place], [place] [address]    Report Status 01/25/2018 FINAL  Final  MRSA PCR Screening     Status: None   Collection Time: 01/18/18  6:33 PM  Result Value Ref Range Status   MRSA by PCR NEGATIVE NEGATIVE Final    Comment:        The GeneXpert MRSA Assay (FDA approved for NASAL specimens only), is one component of a comprehensive MRSA colonization surveillance program. It is not intended to diagnose MRSA infection nor to guide or monitor treatment for MRSA infections. Performed at [facility], [place] [address] [place],  [place] [address]   Urine culture     Status: None   Collection Time: 01/18/18  9:07 PM  Result Value Ref Range Status   Specimen Description URINE, CLEAN CATCH  Final   Special Requests NONE  Final   Culture   Final    NO GROWTH Performed at [facility], [place] [address], [place], [place] [address]    Report Status 01/20/2018 FINAL  Final     Scheduled  Meds: . acetaminophen (TYLENOL) oral liquid 160 mg/5 mL  500 mg Oral TID  . atorvastatin  80 mg Oral QHS  . diltiazem  30 mg Oral Q8H  . enoxaparin (LOVENOX) injection  120 mg Subcutaneous Q24H  . feeding supplement  1 Container Oral TID BM  . feeding supplement (PRO-STAT SUGAR FREE 64)  30 mL Oral BID  . fluconazole  200 mg Oral Daily  . FLUoxetine  60 mg Oral QHS  . insulin aspart  0-15 Units Subcutaneous TID WC  . insulin aspart  0-5 Units Subcutaneous QHS  . insulin detemir  12 Units Subcutaneous BID  . ipratropium-albuterol  3 mL Nebulization TID  . lidocaine  1 patch Transdermal Q24H  . magic mouthwash w/lidocaine  15 mL Oral TID AC & HS  . pantoprazole  40 mg Oral Q1200  . predniSONE  60 mg Oral Q breakfast  . senna-docusate  1 tablet Oral BID  . sodium chloride flush  3 mL Intravenous Q12H      LOS: 10 days   Author:  Berle Mull, MD [facility] Pager: [phone] 01/28/2018 2:10 PM     If 7PM-7AM, please contact night-coverage per Amion 01/28/2018, 2:10 PM

## 2018-01-28 NOTE — Clinical Social Work Note (Signed)
CSW spoke with admission coordinator from [facility]. They won't have bed for pt until 11:00 am in the morning.   [place], [place]

## 2018-01-29 DIAGNOSIS — G2581 Restless legs syndrome: Secondary | ICD-10-CM | POA: Diagnosis not present

## 2018-01-29 DIAGNOSIS — N401 Enlarged prostate with lower urinary tract symptoms: Secondary | ICD-10-CM | POA: Diagnosis not present

## 2018-01-29 DIAGNOSIS — S2241XA Multiple fractures of ribs, right side, initial encounter for closed fracture: Secondary | ICD-10-CM | POA: Diagnosis not present

## 2018-01-29 DIAGNOSIS — Z86711 Personal history of pulmonary embolism: Secondary | ICD-10-CM | POA: Diagnosis not present

## 2018-01-29 DIAGNOSIS — K219 Gastro-esophageal reflux disease without esophagitis: Secondary | ICD-10-CM | POA: Diagnosis not present

## 2018-01-29 DIAGNOSIS — K229 Disease of esophagus, unspecified: Secondary | ICD-10-CM | POA: Diagnosis not present

## 2018-01-29 DIAGNOSIS — Z7401 Bed confinement status: Secondary | ICD-10-CM | POA: Diagnosis not present

## 2018-01-29 DIAGNOSIS — I10 Essential (primary) hypertension: Secondary | ICD-10-CM | POA: Diagnosis not present

## 2018-01-29 DIAGNOSIS — S2249XA Multiple fractures of ribs, unspecified side, initial encounter for closed fracture: Secondary | ICD-10-CM | POA: Diagnosis not present

## 2018-01-29 DIAGNOSIS — W19XXXA Unspecified fall, initial encounter: Secondary | ICD-10-CM | POA: Diagnosis not present

## 2018-01-29 DIAGNOSIS — S2242XS Multiple fractures of ribs, left side, sequela: Secondary | ICD-10-CM | POA: Diagnosis not present

## 2018-01-29 DIAGNOSIS — E119 Type 2 diabetes mellitus without complications: Secondary | ICD-10-CM | POA: Diagnosis not present

## 2018-01-29 DIAGNOSIS — Z9884 Bariatric surgery status: Secondary | ICD-10-CM | POA: Diagnosis not present

## 2018-01-29 DIAGNOSIS — B379 Candidiasis, unspecified: Secondary | ICD-10-CM | POA: Diagnosis not present

## 2018-01-29 DIAGNOSIS — R262 Difficulty in walking, not elsewhere classified: Secondary | ICD-10-CM | POA: Diagnosis not present

## 2018-01-29 DIAGNOSIS — C61 Malignant neoplasm of prostate: Secondary | ICD-10-CM | POA: Diagnosis not present

## 2018-01-29 DIAGNOSIS — J9610 Chronic respiratory failure, unspecified whether with hypoxia or hypercapnia: Secondary | ICD-10-CM | POA: Diagnosis not present

## 2018-01-29 DIAGNOSIS — R296 Repeated falls: Secondary | ICD-10-CM | POA: Diagnosis not present

## 2018-01-29 DIAGNOSIS — N183 Chronic kidney disease, stage 3 (moderate): Secondary | ICD-10-CM | POA: Diagnosis not present

## 2018-01-29 DIAGNOSIS — J9600 Acute respiratory failure, unspecified whether with hypoxia or hypercapnia: Secondary | ICD-10-CM | POA: Diagnosis not present

## 2018-01-29 DIAGNOSIS — G250 Essential tremor: Secondary | ICD-10-CM | POA: Diagnosis not present

## 2018-01-29 DIAGNOSIS — K5900 Constipation, unspecified: Secondary | ICD-10-CM | POA: Diagnosis not present

## 2018-01-29 DIAGNOSIS — R1312 Dysphagia, oropharyngeal phase: Secondary | ICD-10-CM | POA: Diagnosis not present

## 2018-01-29 DIAGNOSIS — M797 Fibromyalgia: Secondary | ICD-10-CM | POA: Diagnosis not present

## 2018-01-29 DIAGNOSIS — M2550 Pain in unspecified joint: Secondary | ICD-10-CM | POA: Diagnosis not present

## 2018-01-29 DIAGNOSIS — E46 Unspecified protein-calorie malnutrition: Secondary | ICD-10-CM | POA: Diagnosis not present

## 2018-01-29 DIAGNOSIS — G4700 Insomnia, unspecified: Secondary | ICD-10-CM | POA: Diagnosis not present

## 2018-01-29 DIAGNOSIS — J9620 Acute and chronic respiratory failure, unspecified whether with hypoxia or hypercapnia: Secondary | ICD-10-CM | POA: Diagnosis not present

## 2018-01-29 DIAGNOSIS — E785 Hyperlipidemia, unspecified: Secondary | ICD-10-CM | POA: Diagnosis not present

## 2018-01-29 DIAGNOSIS — I739 Peripheral vascular disease, unspecified: Secondary | ICD-10-CM | POA: Diagnosis not present

## 2018-01-29 DIAGNOSIS — L8990 Pressure ulcer of unspecified site, unspecified stage: Secondary | ICD-10-CM | POA: Diagnosis not present

## 2018-01-29 DIAGNOSIS — Z7189 Other specified counseling: Secondary | ICD-10-CM | POA: Diagnosis not present

## 2018-01-29 DIAGNOSIS — S2241XD Multiple fractures of ribs, right side, subsequent encounter for fracture with routine healing: Secondary | ICD-10-CM | POA: Diagnosis not present

## 2018-01-29 DIAGNOSIS — I4891 Unspecified atrial fibrillation: Secondary | ICD-10-CM | POA: Diagnosis not present

## 2018-01-29 DIAGNOSIS — K210 Gastro-esophageal reflux disease with esophagitis: Secondary | ICD-10-CM | POA: Diagnosis not present

## 2018-01-29 DIAGNOSIS — D638 Anemia in other chronic diseases classified elsewhere: Secondary | ICD-10-CM | POA: Diagnosis not present

## 2018-01-29 DIAGNOSIS — N178 Other acute kidney failure: Secondary | ICD-10-CM | POA: Diagnosis not present

## 2018-01-29 DIAGNOSIS — J9621 Acute and chronic respiratory failure with hypoxia: Secondary | ICD-10-CM | POA: Diagnosis not present

## 2018-01-29 DIAGNOSIS — E1149 Type 2 diabetes mellitus with other diabetic neurological complication: Secondary | ICD-10-CM | POA: Diagnosis not present

## 2018-01-29 DIAGNOSIS — B370 Candidal stomatitis: Secondary | ICD-10-CM | POA: Diagnosis not present

## 2018-01-29 DIAGNOSIS — J8410 Pulmonary fibrosis, unspecified: Secondary | ICD-10-CM | POA: Diagnosis not present

## 2018-01-29 DIAGNOSIS — E871 Hypo-osmolality and hyponatremia: Secondary | ICD-10-CM | POA: Diagnosis not present

## 2018-01-29 DIAGNOSIS — E43 Unspecified severe protein-calorie malnutrition: Secondary | ICD-10-CM | POA: Diagnosis not present

## 2018-01-29 DIAGNOSIS — D400 Neoplasm of uncertain behavior of prostate: Secondary | ICD-10-CM | POA: Diagnosis not present

## 2018-01-29 DIAGNOSIS — J84112 Idiopathic pulmonary fibrosis: Secondary | ICD-10-CM | POA: Diagnosis not present

## 2018-01-29 DIAGNOSIS — N485 Ulcer of penis: Secondary | ICD-10-CM | POA: Diagnosis not present

## 2018-01-29 DIAGNOSIS — R52 Pain, unspecified: Secondary | ICD-10-CM | POA: Diagnosis not present

## 2018-01-29 DIAGNOSIS — R627 Adult failure to thrive: Secondary | ICD-10-CM | POA: Diagnosis not present

## 2018-01-29 DIAGNOSIS — Z8546 Personal history of malignant neoplasm of prostate: Secondary | ICD-10-CM | POA: Diagnosis not present

## 2018-01-29 DIAGNOSIS — Z515 Encounter for palliative care: Secondary | ICD-10-CM | POA: Diagnosis not present

## 2018-01-29 DIAGNOSIS — M6281 Muscle weakness (generalized): Secondary | ICD-10-CM | POA: Diagnosis not present

## 2018-01-29 DIAGNOSIS — M1380 Other specified arthritis, unspecified site: Secondary | ICD-10-CM | POA: Diagnosis not present

## 2018-01-29 LAB — CBC
HCT: 35.6 % — ABNORMAL LOW (ref 39.0–52.0)
Hemoglobin: 11.4 g/dL — ABNORMAL LOW (ref 13.0–17.0)
MCH: 29.2 pg (ref 26.0–34.0)
MCHC: 32 g/dL (ref 30.0–36.0)
MCV: 91.3 fL (ref 80.0–100.0)
NRBC: 0 % (ref 0.0–0.2)
PLATELETS: 255 10*3/uL (ref 150–400)
RBC: 3.9 MIL/uL — ABNORMAL LOW (ref 4.22–5.81)
RDW: 13.8 % (ref 11.5–15.5)
WBC: 24.5 10*3/uL — AB (ref 4.0–10.5)

## 2018-01-29 LAB — BASIC METABOLIC PANEL
ANION GAP: 10 (ref 5–15)
BUN: 52 mg/dL — ABNORMAL HIGH (ref 8–23)
CO2: 24 mmol/L (ref 22–32)
Calcium: 7.7 mg/dL — ABNORMAL LOW (ref 8.9–10.3)
Chloride: 101 mmol/L (ref 98–111)
Creatinine, Ser: 1.61 mg/dL — ABNORMAL HIGH (ref 0.61–1.24)
GFR calc non Af Amer: 41 mL/min — ABNORMAL LOW (ref >60)
GFR, EST AFRICAN AMERICAN: 47 mL/min — AB (ref >60)
Glucose, Bld: 157 mg/dL — ABNORMAL HIGH (ref 70–99)
Potassium: 4.9 mmol/L (ref 3.5–5.1)
Sodium: 135 mmol/L (ref 135–145)

## 2018-01-29 LAB — GLUCOSE, CAPILLARY
GLUCOSE-CAPILLARY: 117 mg/dL — AB (ref 70–99)
Glucose-Capillary: 91 mg/dL (ref 70–99)

## 2018-01-29 LAB — MAGNESIUM: Magnesium: 2.5 mg/dL — ABNORMAL HIGH (ref 1.7–2.4)

## 2018-01-29 MED ORDER — GABAPENTIN 100 MG PO CAPS: 100.0000 mg | ORAL_CAPSULE | Freq: Every day | ORAL | 0 refills | Status: AC

## 2018-01-29 MED ORDER — FLUCONAZOLE 200 MG PO TABS: 200.0000 mg | ORAL_TABLET | Freq: Every day | ORAL | 0 refills | Status: AC

## 2018-01-29 MED ORDER — PREDNISONE 10 MG PO TABS: ORAL_TABLET | ORAL | 0 refills | Status: AC

## 2018-01-29 MED ORDER — BOOST PO LIQD: 237.0000 mL | Freq: Three times a day (TID) | ORAL | 0 refills | Status: AC

## 2018-01-29 MED ORDER — METOPROLOL TARTRATE 25 MG PO TABS: 12.5000 mg | ORAL_TABLET | Freq: Two times a day (BID) | ORAL | 0 refills | Status: AC | PRN

## 2018-01-29 MED ORDER — MAGIC MOUTHWASH W/LIDOCAINE: 15.0000 mL | Freq: Three times a day (TID) | ORAL | 0 refills | Status: AC

## 2018-01-29 MED ORDER — FUROSEMIDE 20 MG PO TABS: 20.0000 mg | ORAL_TABLET | Freq: Every day | ORAL | 0 refills | Status: AC | PRN

## 2018-01-29 MED ORDER — SENNOSIDES-DOCUSATE SODIUM 8.6-50 MG PO TABS: 1.0000 | ORAL_TABLET | Freq: Two times a day (BID) | ORAL | 0 refills | Status: AC

## 2018-01-29 MED ORDER — PRIMIDONE 50 MG PO TABS: 50.0000 mg | ORAL_TABLET | Freq: Every day | ORAL | 0 refills | Status: AC

## 2018-01-29 MED ORDER — PRO-STAT SUGAR FREE PO LIQD: 30.0000 mL | Freq: Two times a day (BID) | ORAL | 0 refills | Status: AC

## 2018-01-29 MED ORDER — GUAIFENESIN-DM 100-10 MG/5ML PO SYRP: 5.0000 mL | ORAL_SOLUTION | ORAL | 0 refills | Status: AC | PRN

## 2018-01-29 MED ORDER — HYDROCODONE-ACETAMINOPHEN 5-325 MG PO TABS: 1.0000 | ORAL_TABLET | ORAL | 0 refills | Status: AC | PRN

## 2018-01-29 MED ORDER — ENOXAPARIN SODIUM 120 MG/0.8ML ~~LOC~~ SOLN: 120.0000 mg | SUBCUTANEOUS | 0 refills | Status: AC

## 2018-01-29 MED ORDER — LIDOCAINE 5 % EX PTCH: 1.0000 | MEDICATED_PATCH | CUTANEOUS | 0 refills | Status: AC

## 2018-01-29 NOTE — Clinical Social Work Placement (Signed)
Nurse to call and report to [phone], ask for Ann. Patient will be going to room 506.   CLINICAL SOCIAL WORK PLACEMENT  NOTE  Date:  01/29/2018  Patient Details  Name: [name] MRN: [id number] Date of Birth: February 08, 1941  Clinical Social Work is seeking post-discharge placement for this patient at the [place] level of care (*CSW will initial, date and re-position this form in  chart as items are completed):      Patient/family provided with [facility] Work Department's list of facilities offering this level of care within the geographic area requested by the patient (or if unable, by the patient's family).  Yes   Patient/family informed of their freedom to choose among providers that offer the needed level of care, that participate in Medicare, Medicaid or managed care program needed by the patient, have an available bed and are willing to accept the patient.  Yes   Patient/family informed of [facility]'s ownership interest in [facility], as well as of the fact that they are under no obligation to receive care at these facilities.  PASRR submitted to EDS on       PASRR number received on 01/25/18     Existing PASRR number confirmed on       FL2 transmitted to all facilities in geographic area requested by pt/family on 01/25/18     FL2 transmitted to all facilities within larger geographic area on       Patient informed that his/her managed care company has contracts with or will negotiate with certain facilities, including the following:        Yes   Patient/family informed of bed offers received.  Patient chooses bed at [facility]     Physician recommends and patient chooses bed at      Patient to be transferred to [facility] on 01/29/18.  Patient to be transferred to facility by       Patient family notified on 01/29/18 of transfer.  Name of family member notified:  Wife     PHYSICIAN        Additional Comment:    _______________________________________________ Gelene Mink, [place] 01/29/2018, 12:00 PM

## 2018-01-29 NOTE — Progress Notes (Signed)
Palliative: [name] is resting quietly in bed.  He greets me making and mostly keeping eye contact.  He is calm and cooperative today, but appears weak and frail.  Present today at bedside his wife [name].  We talked about symptom management including pain and bowel regimen.  We talked about disposition, discharge to [facility] rehab today.  Patient and family state that [name] will, "do the best he can".  We talked about follow-up appointments with pulmonology.  [name] shares that, "he will do the best he can for as long as he can.  He will let us know when he has had enough, and we will abide his wishes".  Conference with hospitalist related to goals of care discussion, disposition. 7 minutes  Jonathan Axe, NP Palliative Medicine Team [phone] Greater than 50% of this time was spent counseling and coordinating care related to the above assessment and plan

## 2018-01-29 NOTE — Progress Notes (Signed)
Patient will DC to: [facility] Anticipated DC date: 01/29/2018 Family notified: Yes Transport by: Corey Harold   Per MD patient ready for DC to . RN, patient, patient's family, and facility notified of DC. Discharge Summary and FL2 sent to facility. RN to call report prior to discharge [phone]). DC packet on chart. Ambulance transport requested for patient.   CSW will sign off for now as social work intervention is no longer needed. Please consult [place] again if new needs arise.  Geniya Fulgham, LCSW-A [place]/Clinical Social Work Department Cell: [phone]

## 2018-01-29 NOTE — Discharge Summary (Signed)
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)   Fall   PVD (peripheral vascular disease) ([place])   Fibromyalgia   H/O gastric bypass   History of pulmonary embolism   Leukocytosis   Admitted From: hoem Disposition:  SNF  Recommendations for Outpatient Follow-up:  1. Follow-up with PCP in 1 week, pulmonary as recommended. 2. Continue palliative care consultation at SNF for goals of care discussion. 3. Patient needs a repeat BMP and a CBC in 1 week  Follow-up Information    Brand Males, MD Follow up on 02/24/2018.   Specialty:  Pulmonary Disease Why:  10:00a, Contact information: [address] [place] [address] [phone]        Jinny Sanders, MD. Schedule an appointment as soon as possible for a visit in 1 week(s).   Specialty:  Family Medicine Why:  need a repeat CBC and BMP in 1 week.  Contact information: [place] [address] [phone]          Diet recommendation: Cardiac diet  Activity: The patient is advised to gradually reintroduce usual activities.  Discharge Condition: good  Code Status: DNR/DNI  History of present illness: As per the H and P dictated on admission, "[name] is a 77 y.o. male with medical history significant of HTN; RLS;  remote PE; prostate CA: PVD; DM, diet controlled s/p gastric bypass; and afib on Coumadin presenting with a fall and chest pain.   Since hospital d/c on 12/8, he has gotten progressively weaker.  His O2 is not working and they have not come back to fix it.  Yesterday they called pulm and they wanted to see him because he has gotten steroids.  He got steroids in the hospital and seemed to help.  He got 2L home O2 and they allowed her to raise it to 3L but no higher.  She was started on Prednisone yesterday - one dose yesterday.  He has had nothing to eat since last night, small amount of fruit.  His appetite is terrible.  He has pulmonary fibrosis and told they were nothing they could do - but they have read up on would like to try metformin for this.  They report that he was on metformin prior to gastric bypass and he came off metformin and his lungs have worsened since then.  Basically the doctor told them yesterday that they would need a really expensive medication that would cause significant side effects.  He was discharged on [place] and he is a mouth breather.  He fell this AM after his wife left for work.  He did not break anything.  His friend stopped by and helped him up.  The family is concerned about improving his quality of life.  "Tell doctor I am weak, have no strength.  I must be better before I leave.""  Hospital Course:  Summary of his active problems in the hospital is as following. Acute on chronic hypoxemic respiratory failure / underlying ILD NOS high dose systemic steroids - follow with the pulmonary fibrosis/ILD clinic on discharge - med tx per PCCM -high-resolution CT scan consistent with UIP. PCCM recommends transitioning to oral steroids with a 3 weeks taper. Outpatient follow-up recommended in 1 week on discharge.  Acute rib fracture. Fall. Patient's initial reason for presentation was secondary to a fall. Other work-up were negative. X-ray chest on admission was also negative for  any rib fracture. A CT scan of the chest was performed for further evaluation of his fibrosis which does show that the patient does have sustained 2 rib fractures without any pneumothorax or any other acute abnormality. Continue pain control with Norco.  Chronic Afib w/ acute RVR Heart rate well controlled -already anticoagulated with Lovenox Patient was on metoprolol which was stopped due to hypotension and worsening renal function. Patient was given Cardizem with significant drop in his blood pressure. For now we will just use PRN Lopressor 12.5 mg twice daily.  Hx of Pulmonary embolism Recurred while therapeutic on warfarin - PCCM suggests enoxaparin long-term as the patient cannot afford DOAC  Coag negative staph in blood culture 1 of 2 -clinically most consistent with a contaminant -no clinical evidence to suggest active infection  Asymmetric irregular wall thickening noted distal esophagus Appreciated on CTangio chest 01/12/18 during prior admit- distal esophageal neoplasm a concern - will need EGD when resp status more stable -discussed finding with patient and wife at bedside -wife reports a very long history of severe reflux and multiple prior EGDs -currently patient's respiratory status is not stable enough to allow a repeat EGD -this will need to be pursued in the outpatient setting as soon as the patient's respiratory status stabilizes  Type 2 diabetes mellitus, uncontrolled with hyperglycemia with renal complication. CBG now well controlled, monitor.  Continue sliding scale and insulin regimen.  AKI on CKD stage 3 Creatinine was improving up until 01/26/2018.  Then worsening again. Likely from poor p.o. intake. Currently renal function has stabilized.  Monitor.  Prostate Cancer Follow-up with the urologist and primary care provider on discharge -asymptomatic  Oral candidiasis with possible candidal esophagitis: Mouth pain Due to prolonged use of steroids -Diflucan  dose increased. Magic mouthwash.  All other chronic medical condition were stable during the hospitalization.  Patient was een by physical therapy, who recommended home SNF, which was arranged by Education officer, museum and case Freight forwarder. On the day of the discharge the patient's vitals are stable, and no other acute medical condition were reported by patient. the patient was felt safe to be discharge at SNF with therapy.  Consultants: PCCM Palliative care Procedures: None  DISCHARGE MEDICATION: Allergies as of 01/29/2018      Reactions   Sulfacetamide Sodium Swelling   throat swelling   Latex Itching   Severe contact dermatitis   Adhesive [tape] Other (See Comments)   Tears skin-- "No tape of any kind, they all tear skin right off"      Medication List    STOP taking these medications   acarbose 50 MG tablet Commonly known as:  PRECOSE   chlorpheniramine-HYDROcodone 10-8 MG/5ML Suer Commonly known as:  TUSSIONEX   sucralfate 1 g tablet Commonly known as:  CARAFATE   tiZANidine 4 MG tablet Commonly known as:  ZANAFLEX   warfarin 2.5 MG tablet Commonly known as:  COUMADIN     TAKE these medications   atorvastatin 80 MG tablet Commonly known as:  LIPITOR Take 1 tablet (80 mg total) by mouth at bedtime.   benzonatate 200 MG capsule Commonly known as:  TESSALON Take 1 capsule (200 mg total) by mouth 3 (three) times daily as needed for cough.   enoxaparin 120 MG/0.8ML injection Commonly known as:  LOVENOX Inject 0.8 mLs (120 mg total) into the skin daily.   feeding supplement (PRO-STAT SUGAR FREE 64) Liqd Take 30 mLs by mouth 2 (two) times daily.   Fish Oil 1000 MG Caps Take 1,000 mg by mouth daily.   fluconazole 200 MG tablet Commonly known as:  DIFLUCAN Take 1 tablet (200 mg total) by mouth daily for 5 days. Start taking on:  January 30, 2018   FLUoxetine 20 MG capsule Commonly known as:  PROZAC TAKE 3 CAPSULES BY MOUTH EVERYDAY AT BEDTIME What changed:  See the  new instructions.   furosemide 20 MG tablet Commonly known as:  LASIX Take 1 tablet (20 mg total) by mouth daily as needed for fluid or edema (weight gain of 3Lbs in 1 day). What changed:    when to take this  reasons to take this   gabapentin 100 MG capsule Commonly known as:  NEURONTIN Take 1 capsule (100 mg total) by mouth at bedtime. What changed:    medication strength  how much to take  when to take this   guaiFENesin-dextromethorphan 100-10 MG/5ML syrup Commonly known as:  ROBITUSSIN DM Take 5 mLs by mouth every 4 (four) hours as needed for cough (chest congestion).   HYDROcodone-acetaminophen 5-325 MG tablet Commonly known as:  NORCO/VICODIN Take 1 tablet by mouth every 4 (four) hours as needed for up to 5 days for severe pain.   hydrocortisone 2.5 % cream Apply 1 application topically 2 (two) times daily as needed (itching).   hydroxypropyl methylcellulose / hypromellose 2.5 % ophthalmic solution Commonly known as:  ISOPTO TEARS / GONIOVISC Place 1 drop into both eyes 2 (two) times daily as needed for dry eyes.   ipratropium-albuterol 0.5-2.5 (3) MG/3ML Soln Commonly known as:  DUONEB Take 3 mLs by nebulization every 6 (six) hours as needed. What changed:  reasons to take this   lactose free nutrition Liqd Take 237 mLs by mouth 3 (three) times daily between meals.   lidocaine 5 % Commonly known as:  LIDODERM Place 1 patch onto the skin daily. Remove & Discard patch within 12 hours or as directed by MD   magic mouthwash w/lidocaine Soln Take 15 mLs by mouth 4 (four) times daily -  before meals and at bedtime.   metoprolol tartrate 25 MG tablet Commonly known as:  LOPRESSOR Take 0.5 tablets (12.5 mg total) by mouth 2 (two) times daily as needed (For heart rate more than 120 sustained.).   omeprazole 40 MG capsule Commonly known as:  PRILOSEC TAKE 1 CAPSULE (40 MG TOTAL) BY MOUTH DAILY. What changed:  when to take this   predniSONE 10 MG  tablet Commonly known as:  DELTASONE Take 60mg  daily for 3days,Take 50mg  daily for 5days,Take 40mg  daily for 5days,Take 30mg  daily for 5days,Take 20mg  daily for 5days,Take 10mg  daily for 5days, then stop What changed:  additional instructions   PRESCRIPTION MEDICATION Apply 1 g topically 4 (four) times daily as needed (pain). Baclofen/Gabapentin/Lidocaine Compund Cream   primidone 50 MG tablet Commonly known as:  MYSOLINE Take 1 tablet (50 mg total) by mouth at bedtime. What changed:  how much to take  how to take this  when to take this  additional instructions   ranitidine 300 MG tablet Commonly known as:  ZANTAC Take 300 mg by mouth at bedtime.   senna-docusate 8.6-50 MG tablet Commonly known as:  Senokot-S Take 1 tablet by mouth 2 (two) times daily.   tamsulosin 0.4 MG Caps capsule Commonly known as:  FLOMAX Take 0.4 mg by mouth at bedtime.   vitamin E 400 UNIT capsule Take 400 Units by mouth daily.   zolpidem 12.5 MG CR tablet Commonly known as:  AMBIEN CR Take 12.5 mg by mouth at bedtime.      Allergies  Allergen Reactions  . Sulfacetamide Sodium Swelling    throat swelling  . Latex Itching    Severe contact dermatitis  . Adhesive [Tape] Other (See Comments)    Tears skin-- "No tape of any kind, they all tear skin right off"   Discharge Instructions    Discharge instructions   Complete by:  As directed    It is important that you read following instructions as well as go over your medication list with RN to help you understand your care after this hospitalization.  Discharge Instructions: Please follow-up with PCP in one week  Please request your primary care physician to go over all Hospital Tests and Procedure/Radiological results at the follow up,  Please get all Hospital records sent to your PCP by signing hospital release before you go home.   Do not take more than prescribed Pain, Sleep and Anxiety Medications. You were cared for by a  hospitalist during your hospital stay. If you have any questions about your discharge medications or the care you received while you were in the hospital after you are discharged, you can call the unit you were admitted to and ask to speak with the hospitalist on call if the hospitalist that took care of you is not available.  Once you are discharged, your primary care physician will handle any further medical issues. Please note that NO REFILLS for any discharge medications will be authorized once you are discharged, as it is imperative that you return to your primary care physician (or establish a relationship with a primary care physician if you do not have one) for your aftercare needs so that they can reassess your need for medications and monitor your lab values. You Must read complete instructions/literature along with all the possible adverse reactions/side effects for all the Medicines you take and that have been prescribed to you. Take any new Medicines after you have completely understood and accept all the possible adverse reactions/side effects. Wear Seat belts while driving. If you have smoked or chewed Tobacco in the last 2 yrs please stop smoking and/or stop any Recreational drug use.   Increase activity slowly   Complete by:  As directed      Discharge Exam: Filed Weights   01/19/18 1015  Weight: 80.3 kg   Vitals:   01/29/18 0725 01/29/18 0726  BP: 117/80   Pulse: 80 93  Resp: 14   Temp: (!) 97.4 F (36.3 C)   SpO2:  100%   General: Appear in mild distress, no Rash; Oral Mucosa moist. Cardiovascular: S1 and S2 Present, no Murmur, no JVD Respiratory: Bilateral Air entry present and bilateral basal Crackles, no wheezes Abdomen: Bowel Sound present, Soft and no tenderness Extremities: no Pedal edema, no calf tenderness Neurology: Grossly no focal neuro deficit.  The results of significant diagnostics from this hospitalization (  including imaging, microbiology, ancillary  and laboratory) are listed below for reference.    Significant Diagnostic Studies: Dg Chest 2 View  Result Date: 12/31/2017 CLINICAL DATA:  Shortness of breath for 3 weeks. EXAM: CHEST - 2 VIEW COMPARISON:  01/06/2017 FINDINGS: There is hazy airspace disease in the right and left lower lobes concerning for pneumonia. There is no pleural effusion or pneumothorax. The heart and mediastinal contours are unremarkable. The osseous structures are unremarkable. IMPRESSION: Hazy airspace disease in the right and left lower lobes concerning for pneumonia. Electronically Signed   By: Kathreen Devoid   On: 12/31/2017 13:13   Ct Head Wo Contrast  Result Date: 01/18/2018 CLINICAL DATA:  Fall, hit head.  On blood thinner EXAM: CT HEAD WITHOUT CONTRAST CT MAXILLOFACIAL WITHOUT CONTRAST CT CERVICAL SPINE WITHOUT CONTRAST TECHNIQUE: Multidetector CT imaging of the head, cervical spine, and maxillofacial structures were performed using the standard protocol without intravenous contrast. Multiplanar CT image reconstructions of the cervical spine and maxillofacial structures were also generated. COMPARISON:  None. FINDINGS: CT HEAD FINDINGS Brain: Mild age related volume loss. No acute intracranial abnormality. Specifically, no hemorrhage, hydrocephalus, mass lesion, acute infarction, or significant intracranial injury. Vascular: No hyperdense vessel or unexpected calcification. Skull: No acute calvarial abnormality. Other: None CT MAXILLOFACIAL FINDINGS Osseous: No fracture or mandibular dislocation. No destructive process. Orbits: Negative. No traumatic or inflammatory finding. Sinuses: Clear Soft tissues: Negative CT CERVICAL SPINE FINDINGS Alignment: No subluxation Skull base and vertebrae: No acute fracture. No primary bone lesion or focal pathologic process. Soft tissues and spinal canal: No prevertebral fluid or swelling. No visible canal hematoma. Disc levels: Prior anterior fusion from C3-C6. Degenerative disc and  facet disease throughout the cervical spine. Upper chest: No acute findings Other: None IMPRESSION: No acute intracranial abnormality. No facial or cervical spine fracture. Electronically Signed   By: Rolm Baptise M.D.   On: 01/18/2018 11:34   Ct Angio Chest W/cm &/or Wo Cm  Result Date: 01/12/2018 CLINICAL DATA:  Cough with dyspnea and wheezing. New diagnosis of prostate cancer. Abdominal pain. EXAM: CT ANGIOGRAPHY CHEST CT ABDOMEN AND PELVIS WITH CONTRAST TECHNIQUE: Multidetector CT imaging of the chest was performed using the standard protocol during bolus administration of intravenous contrast. Multiplanar CT image reconstructions and MIPs were obtained to evaluate the vascular anatomy. Multidetector CT imaging of the abdomen and pelvis was performed using the standard protocol during bolus administration of intravenous contrast. CONTRAST:  139mL ISOVUE-370 IOPAMIDOL (ISOVUE-370) INJECTION 76% COMPARISON:  Pelvic CT 10/25/2017. FINDINGS: CTA CHEST FINDINGS Cardiovascular: Heart size upper normal. Coronary artery calcification is evident. Atherosclerotic calcification is noted in the wall of the thoracic aorta. Right main pulmonary artery measures 3.3 cm diameter. Left main pulmonary artery measures up to 2.7 cm diameter. Pulmonary arteries are well opacified. No right-sided filling defects to suggest acute pulmonary embolus. Patient does have a single small focus of segmental to subsegmental pulmonary embolus in the left upper lobe (image 142/series 11). Potential 3-4 mm embolic focus identified in distal pulmonary artery to the lingula (image 203/series 11). No other left-sided pulmonary embolic disease. Mediastinum/Nodes: Upper normal to mild mediastinal lymphadenopathy evident. 11 mm short axis right paratracheal node is partially obscured by beam hardening artifact from contrast in the SVC. The the the 14 mm AP window lymph node is visible on 45/4. Small lymph nodes evident in each hilum with 12 mm short  axis inferior right hilar lymph node evident. Probable irregular non circumferential wall thickening in the distal esophagus (57/4) There  is no axillary lymphadenopathy. Lungs/Pleura: The central tracheobronchial airways are patent. Basilar predominant interstitial opacity noted with subpleural reticulation, alveolar ground-glass opacity, and diffuse bronchiectasis. Areas of peripheral patchy ground-glass attenuation are seen in the upper lungs bilaterally. This ground-glass attenuation is associated with bronchiectasis. No pleural effusion. Musculoskeletal: No worrisome lytic or sclerotic osseous abnormality. Patient is status post left shoulder replacement. Review of the MIP images confirms the above findings. CT ABDOMEN and PELVIS FINDINGS Hepatobiliary: No focal abnormality within the liver parenchyma. Gallbladder is distended. No intrahepatic or extrahepatic biliary dilation. Pancreas: No focal mass lesion. No dilatation of the main duct. No intraparenchymal cyst. No peripancreatic edema. Spleen: Wedge-shaped area of decreased perfusion in the the peripheral spleen suggests infarct, age indeterminate. Adrenals/Urinary Tract: No adrenal nodule or mass. 4 cm cyst identified lower pole right kidney. 8 mm well-defined low-density lesion in the lower interpolar region of the left kidney is too small to characterize but likely a cyst. No evidence for hydroureter. The urinary bladder appears normal for the degree of distention. Stomach/Bowel: Status post gastric bypass. Duodenum is normally positioned as is the ligament of Treitz. No small bowel wall thickening. No small bowel dilatation. The terminal ileum is normal. The appendix is not visualized, but there is no edema or inflammation in the region of the cecum. Diverticular changes are noted in the left colon without evidence of diverticulitis. Vascular/Lymphatic: There is abdominal aortic atherosclerosis without aneurysm. There is no gastrohepatic or  hepatoduodenal ligament lymphadenopathy. No intraperitoneal or retroperitoneal lymphadenopathy. No pelvic sidewall lymphadenopathy. Reproductive: Prostate gland unremarkable. Penile prosthesis evident with irregular contour of the reservoir. Reservoir rupture not excluded. Other: No intraperitoneal free fluid. Musculoskeletal: Bilateral SI joint effusion evident. Lower lumbar fusion hardware associated. Patient is status post vertebral augmentation at L1. Review of the MIP images confirms the above findings. IMPRESSION: 1. Tiny segmental to subsegmental pulmonary embolus in the left upper lobe with potential miniscule focus of embolism in a distal pulmonary artery to the lingula. 2. Advanced interstitial, basilar predominant fibrotic lung disease. Scattered areas of ground-glass attenuation are associated with bronchiectasis. 3. Enlargement of the main pulmonary arteries suggests pulmonary arterial hypertension. 4. Asymmetric irregular wall thickening noted distal esophagus. Distal esophageal neoplasm a concern. 5. Mild mediastinal and hilar lymphadenopathy, possibly reactive. In the setting of neoplasm, metastatic disease would be a concern. 6. 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 7. Status post gastric bypass. These results will be called to the ordering clinician or representative by the Radiologist Assistant, and communication documented in the PACS or zVision Dashboard. Electronically Signed   By: Misty Stanley M.D.   On: 01/12/2018 12:25   Ct Cervical Spine Wo Contrast  Result Date: 01/18/2018 CLINICAL DATA:  Fall, hit head.  On blood thinner EXAM: CT HEAD WITHOUT CONTRAST CT MAXILLOFACIAL WITHOUT CONTRAST CT CERVICAL SPINE WITHOUT CONTRAST TECHNIQUE: Multidetector CT imaging of the head, cervical spine, and maxillofacial structures were performed using the standard protocol without intravenous contrast. Multiplanar CT image reconstructions of  the cervical spine and maxillofacial structures were also generated. COMPARISON:  None. FINDINGS: CT HEAD FINDINGS Brain: Mild age related volume loss. No acute intracranial abnormality. Specifically, no hemorrhage, hydrocephalus, mass lesion, acute infarction, or significant intracranial injury. Vascular: No hyperdense vessel or unexpected calcification. Skull: No acute calvarial abnormality. Other: None CT MAXILLOFACIAL FINDINGS Osseous: No fracture or mandibular dislocation. No destructive process. Orbits: Negative. No traumatic or inflammatory finding. Sinuses: Clear Soft tissues: Negative CT CERVICAL SPINE FINDINGS Alignment:  No subluxation Skull base and vertebrae: No acute fracture. No primary bone lesion or focal pathologic process. Soft tissues and spinal canal: No prevertebral fluid or swelling. No visible canal hematoma. Disc levels: Prior anterior fusion from C3-C6. Degenerative disc and facet disease throughout the cervical spine. Upper chest: No acute findings Other: None IMPRESSION: No acute intracranial abnormality. No facial or cervical spine fracture. Electronically Signed   By: Rolm Baptise M.D.   On: 01/18/2018 11:34   Ct Abdomen Pelvis W Contrast  Result Date: 01/12/2018 CLINICAL DATA:  Cough with dyspnea and wheezing. New diagnosis of prostate cancer. Abdominal pain. EXAM: CT ANGIOGRAPHY CHEST CT ABDOMEN AND PELVIS WITH CONTRAST TECHNIQUE: Multidetector CT imaging of the chest was performed using the standard protocol during bolus administration of intravenous contrast. Multiplanar CT image reconstructions and MIPs were obtained to evaluate the vascular anatomy. Multidetector CT imaging of the abdomen and pelvis was performed using the standard protocol during bolus administration of intravenous contrast. CONTRAST:  133mL ISOVUE-370 IOPAMIDOL (ISOVUE-370) INJECTION 76% COMPARISON:  Pelvic CT 10/25/2017. FINDINGS: CTA CHEST FINDINGS Cardiovascular: Heart size upper normal. Coronary artery  calcification is evident. Atherosclerotic calcification is noted in the wall of the thoracic aorta. Right main pulmonary artery measures 3.3 cm diameter. Left main pulmonary artery measures up to 2.7 cm diameter. Pulmonary arteries are well opacified. No right-sided filling defects to suggest acute pulmonary embolus. Patient does have a single small focus of segmental to subsegmental pulmonary embolus in the left upper lobe (image 142/series 11). Potential 3-4 mm embolic focus identified in distal pulmonary artery to the lingula (image 203/series 11). No other left-sided pulmonary embolic disease. Mediastinum/Nodes: Upper normal to mild mediastinal lymphadenopathy evident. 11 mm short axis right paratracheal node is partially obscured by beam hardening artifact from contrast in the SVC. The the the 14 mm AP window lymph node is visible on 45/4. Small lymph nodes evident in each hilum with 12 mm short axis inferior right hilar lymph node evident. Probable irregular non circumferential wall thickening in the distal esophagus (57/4) There is no axillary lymphadenopathy. Lungs/Pleura: The central tracheobronchial airways are patent. Basilar predominant interstitial opacity noted with subpleural reticulation, alveolar ground-glass opacity, and diffuse bronchiectasis. Areas of peripheral patchy ground-glass attenuation are seen in the upper lungs bilaterally. This ground-glass attenuation is associated with bronchiectasis. No pleural effusion. Musculoskeletal: No worrisome lytic or sclerotic osseous abnormality. Patient is status post left shoulder replacement. Review of the MIP images confirms the above findings. CT ABDOMEN and PELVIS FINDINGS Hepatobiliary: No focal abnormality within the liver parenchyma. Gallbladder is distended. No intrahepatic or extrahepatic biliary dilation. Pancreas: No focal mass lesion. No dilatation of the main duct. No intraparenchymal cyst. No peripancreatic edema. Spleen: Wedge-shaped area  of decreased perfusion in the the peripheral spleen suggests infarct, age indeterminate. Adrenals/Urinary Tract: No adrenal nodule or mass. 4 cm cyst identified lower pole right kidney. 8 mm well-defined low-density lesion in the lower interpolar region of the left kidney is too small to characterize but likely a cyst. No evidence for hydroureter. The urinary bladder appears normal for the degree of distention. Stomach/Bowel: Status post gastric bypass. Duodenum is normally positioned as is the ligament of Treitz. No small bowel wall thickening. No small bowel dilatation. The terminal ileum is normal. The appendix is not visualized, but there is no edema or inflammation in the region of the cecum. Diverticular changes are noted in the left colon without evidence of diverticulitis. Vascular/Lymphatic: There is abdominal aortic atherosclerosis without aneurysm. There is no  gastrohepatic or hepatoduodenal ligament lymphadenopathy. No intraperitoneal or retroperitoneal lymphadenopathy. No pelvic sidewall lymphadenopathy. Reproductive: Prostate gland unremarkable. Penile prosthesis evident with irregular contour of the reservoir. Reservoir rupture not excluded. Other: No intraperitoneal free fluid. Musculoskeletal: Bilateral SI joint effusion evident. Lower lumbar fusion hardware associated. Patient is status post vertebral augmentation at L1. Review of the MIP images confirms the above findings. IMPRESSION: 1. Tiny segmental to subsegmental pulmonary embolus in the left upper lobe with potential miniscule focus of embolism in a distal pulmonary artery to the lingula. 2. Advanced interstitial, basilar predominant fibrotic lung disease. Scattered areas of ground-glass attenuation are associated with bronchiectasis. 3. Enlargement of the main pulmonary arteries suggests pulmonary arterial hypertension. 4. Asymmetric irregular wall thickening noted distal esophagus. Distal esophageal neoplasm a concern. 5. Mild mediastinal  and hilar lymphadenopathy, possibly reactive. In the setting of neoplasm, metastatic disease would be a concern. 6. 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 7. Status post gastric bypass. These results will be called to the ordering clinician or representative by the Radiologist Assistant, and communication documented in the PACS or zVision Dashboard. Electronically Signed   By: Misty Stanley M.D.   On: 01/12/2018 12:25   Ct Chest High Resolution  Result Date: 01/25/2018 CLINICAL DATA:  77 year old male with history of interstitial lung disease. Follow-up study. EXAM: CT CHEST WITHOUT CONTRAST TECHNIQUE: Multidetector CT imaging of the chest was performed following the standard protocol without intravenous contrast. High resolution imaging of the lungs, as well as inspiratory and expiratory imaging, was performed. COMPARISON:  Chest CT 01/12/2018. FINDINGS: Cardiovascular: Heart size is mildly enlarged with biatrial dilatation. There is no significant pericardial fluid, thickening or pericardial calcification. There is aortic atherosclerosis, as well as atherosclerosis of the great vessels of the mediastinum and the coronary arteries, including calcified atherosclerotic plaque in the left main, left anterior descending, left circumflex and right coronary arteries. Calcifications of the aortic valve. Mediastinum/Nodes: No pathologically enlarged mediastinal or hilar lymph nodes. Please note that accurate exclusion of hilar adenopathy is limited on noncontrast CT scans. Esophagus is unremarkable in appearance. No axillary lymphadenopathy. Lungs/Pleura: Today's study is limited by considerable patient respiratory motion. This particularly affects high-resolution imaging of the lungs. With these limitations in mind, high-resolution images demonstrate patchy areas of ground-glass attenuation, septal thickening, thickening of the peribronchovascular  interstitium and regional areas of cylindrical bronchiectasis, most evident throughout the mid to lower lungs bilaterally. Compared to the prior study there are increasing areas of more frank peribronchovascular airspace consolidation, most severe in the right lower lobe, which could reflect an acute infection. No definitive honeycombing is confidently identified on today's examination. Inspiratory and expiratory imaging is remarkable for mild air trapping, indicative of mild small airways disease. Trace right pleural effusion. No pneumothorax. Upper Abdomen: Aortic atherosclerosis. Musculoskeletal: There are no aggressive appearing lytic or blastic lesions noted in the visualized portions of the skeleton. Multiple old healed rib fractures. In addition, there are acute minimally displaced fractures of the anterolateral aspect of the right fifth and sixth ribs. Status post left shoulder hemiarthroplasty. IMPRESSION: 1. Today's study is severely limited by extensive patient respiratory motion. 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 2. In addition, there is more focal airspace consolidation in the right lower lobe, concerning for an acute bronchopneumonia. 3. Mildly displaced acute fractures of the anterolateral aspect of the right fifth and sixth ribs. No associated pneumothorax.  Trace right pleural effusion. 4. Aortic atherosclerosis, in addition to left main and 3 vessel coronary artery disease. Assessment for potential risk factor modification, dietary therapy or pharmacologic therapy may be warranted, if clinically indicated. 5. There are calcifications of the aortic valve. Echocardiographic correlation for evaluation of potential valvular dysfunction may be warranted if clinically indicated. 6. Cardiomegaly with biatrial dilatation Aortic Atherosclerosis (ICD10-I70.0). Electronically Signed   By:  Vinnie Langton M.D.   On: 01/25/2018 09:54   Dg Pelvis Portable  Result Date: 01/18/2018 CLINICAL DATA:  Trauma secondary to a fall today. EXAM: PORTABLE PELVIS 1-2 VIEWS COMPARISON:  CT scan of the abdomen and pelvis dated 01/12/2018 FINDINGS: No acute abnormalities. Previous SI joint fusion. Previous lower lumbar fusion. Penile prosthesis. IMPRESSION: 1. No acute abnormalities. 2. Hardware intact. Electronically Signed   By: [name] M.D.   On: 01/18/2018 11:11   Dg Chest Port 1 View  Result Date: 01/22/2018 CLINICAL DATA:  Interstitial lung disease. EXAM: PORTABLE CHEST 1 VIEW COMPARISON:  Radiograph January 18, 2018. FINDINGS: Stable cardiomediastinal silhouette. Stable diffuse interstitial densities are noted throughout both lungs which may represent chronic interstitial lung disease, but acute superimposed inflammation or edema could not be excluded. No pneumothorax is noted. Status post left shoulder arthroplasty. IMPRESSION: Stable diffuse interstitial densities are noted throughout both lungs which may represent chronic interstitial lung disease, but acute superimposed edema or inflammation can not be excluded. Electronically Signed   By: Marijo Conception, M.D.   On: 01/22/2018 09:13   Dg Chest Portable 1 View  Result Date: 01/18/2018 CLINICAL DATA:  Bilateral ribcage pain secondary to a fall today. EXAM: PORTABLE CHEST 1 VIEW COMPARISON:  Chest x-rays dated 01/14/2018 and 12/31/2017 and chest CT dated 01/12/2018 FINDINGS: Heart size and pulmonary vascularity are normal. Chronic interstitial disease noted at the lung bases. No acute infiltrates or effusions. No pneumothorax or visible rib fractures or other acute bone abnormality. Left shoulder prosthesis. IMPRESSION: No acute cardiopulmonary findings. Chronic interstitial lung disease. Electronically Signed   By: [name] M.D.   On: 01/18/2018 11:09   Dg Chest Port 1 View  Addendum Date: 01/14/2018   ADDENDUM REPORT:  01/14/2018 06:35 ADDENDUM: The sentence should read focal density is now seen in the bases bilaterally Electronically Signed   By: Inez Catalina M.D.   On: 01/14/2018 06:35   Result Date: 01/14/2018 CLINICAL DATA:  Shortness of breath EXAM: PORTABLE CHEST 1 VIEW COMPARISON:  01/12/2018 FINDINGS: Cardiac shadow is stable. Aortic calcifications are again noted. The lungs are again well aerated with diffuse fibrotic changes similar to that seen on the prior CT examination. Some increased focal density is noted in the bases bilaterally likely representing acute superimposed infiltrate. No effusion or pneumothorax is seen. Postsurgical changes in the left shoulder are noted. IMPRESSION: Acute on chronic basilar infiltrates with underlying fibrotic change. Electronically Signed: By: Inez Catalina M.D. On: 01/14/2018 06:32   Vas [place] Lower Extremity Venous (dvt) (only [facility])  Result Date: 01/12/2018  Lower Venous Study Indications: Pulmonary embolism.  Performing Technologist: Oliver Hum RVT  Examination Guidelines: A complete evaluation includes B-mode imaging, spectral Doppler, color Doppler, and power Doppler as needed of all accessible portions of each vessel. Bilateral testing is considered an integral part of a complete examination. Limited examinations for reoccurring indications may be performed as noted. The reflux portion of the exam is performed with the patient in reverse Trendelenburg.  Right Venous Findings: +---------+---------------+---------+-----------+----------+-------+  CompressibilityPhasicitySpontaneityPropertiesSummary +---------+---------------+---------+-----------+----------+-------+ CFV      Full           Yes      Yes                          +---------+---------------+---------+-----------+----------+-------+ SFJ      Full                                                 +---------+---------------+---------+-----------+----------+-------+ FV Prox  Full                                                  +---------+---------------+---------+-----------+----------+-------+ FV Mid   Full                                                 +---------+---------------+---------+-----------+----------+-------+ FV DistalFull                                                 +---------+---------------+---------+-----------+----------+-------+ PFV      Full                                                 +---------+---------------+---------+-----------+----------+-------+ POP      Full           Yes      Yes                          +---------+---------------+---------+-----------+----------+-------+ PTV      Full                                                 +---------+---------------+---------+-----------+----------+-------+ PERO     Full                                                 +---------+---------------+---------+-----------+----------+-------+  Left Venous Findings: +---------+---------------+---------+-----------+----------+-------+          CompressibilityPhasicitySpontaneityPropertiesSummary +---------+---------------+---------+-----------+----------+-------+ CFV      Full           Yes      Yes                          +---------+---------------+---------+-----------+----------+-------+ SFJ      Full                                                 +---------+---------------+---------+-----------+----------+-------+  FV Prox  Full                                                 +---------+---------------+---------+-----------+----------+-------+ FV Mid   Full                                                 +---------+---------------+---------+-----------+----------+-------+ FV DistalFull                                                 +---------+---------------+---------+-----------+----------+-------+ PFV      Full                                                  +---------+---------------+---------+-----------+----------+-------+ POP      Full           Yes      Yes                          +---------+---------------+---------+-----------+----------+-------+ PTV      Full                                                 +---------+---------------+---------+-----------+----------+-------+ PERO     Full                                                 +---------+---------------+---------+-----------+----------+-------+    Summary: Right: There is no evidence of deep vein thrombosis in the lower extremity. No cystic structure found in the popliteal fossa. Left: There is no evidence of deep vein thrombosis in the lower extremity. No cystic structure found in the popliteal fossa.  *See table(s) above for measurements and observations. Electronically signed by Quay Burow MD on 01/12/2018 at 4:45:11 PM.    Final    Ct Maxillofacial Wo Contrast  Result Date: 01/18/2018 CLINICAL DATA:  Fall, hit head.  On blood thinner EXAM: CT HEAD WITHOUT CONTRAST CT MAXILLOFACIAL WITHOUT CONTRAST CT CERVICAL SPINE WITHOUT CONTRAST TECHNIQUE: Multidetector CT imaging of the head, cervical spine, and maxillofacial structures were performed using the standard protocol without intravenous contrast. Multiplanar CT image reconstructions of the cervical spine and maxillofacial structures were also generated. COMPARISON:  None. FINDINGS: CT HEAD FINDINGS Brain: Mild age related volume loss. No acute intracranial abnormality. Specifically, no hemorrhage, hydrocephalus, mass lesion, acute infarction, or significant intracranial injury. Vascular: No hyperdense vessel or unexpected calcification. Skull: No acute calvarial abnormality. Other: None CT MAXILLOFACIAL FINDINGS Osseous: No fracture or mandibular dislocation. No destructive process. Orbits: Negative. No traumatic or inflammatory finding. Sinuses: Clear Soft tissues: Negative CT CERVICAL SPINE FINDINGS Alignment: No  subluxation Skull base and vertebrae: No acute fracture. No primary bone  lesion or focal pathologic process. Soft tissues and spinal canal: No prevertebral fluid or swelling. No visible canal hematoma. Disc levels: Prior anterior fusion from C3-C6. Degenerative disc and facet disease throughout the cervical spine. Upper chest: No acute findings Other: None IMPRESSION: No acute intracranial abnormality. No facial or cervical spine fracture. Electronically Signed   By: Rolm Baptise M.D.   On: 01/18/2018 11:34    Microbiology: No results found for this or any previous visit (from the past 240 hour(s)).   Labs: CBC: Recent Labs  Lab 01/24/18 0225 01/26/18 0307 01/27/18 0553 01/29/18 0310  WBC 26.9* 24.8* 25.8* 24.5*  NEUTROABS 24.8*  --  22.7*  --   HGB 12.2* 12.5* 12.3* 11.4*  HCT 38.8* 38.2* 37.5* 35.6*  MCV 90.7 90.3 90.4 91.3  PLT 190 301 292 116   Basic Metabolic Panel: Recent Labs  Lab 01/24/18 0225 01/26/18 0307 01/27/18 0553 01/28/18 0809 01/29/18 0310  NA 134* 132* 133* 133* 135  K 4.9 4.9 4.7 4.6 4.9  CL 99 96* 97* 97* 101  CO2 23 25 23 24 24   GLUCOSE 192* 244* 227* 123* 157*  BUN 34* 46* 46* 46* 52*  CREATININE 1.29* 1.55* 1.55* 1.59* 1.61*  CALCIUM 7.9* 8.1* 8.0* 7.9* 7.7*  MG 2.3  --  2.4  --  2.5*  PHOS 3.2  --   --   --   --    Liver Function Tests: Recent Labs  Lab 01/24/18 0225 01/26/18 0307 01/27/18 0553  AST  --  20 19  ALT  --  21 19  ALKPHOS  --  63 62  BILITOT  --  0.5 0.8  PROT  --  5.8* 5.6*  ALBUMIN 2.2* 2.1* 2.1*   No results for input(s): LIPASE, AMYLASE in the last 168 hours. No results for input(s): AMMONIA in the last 168 hours. Cardiac Enzymes: No results for input(s): CKTOTAL, CKMB, CKMBINDEX, TROPONINI in the last 168 hours. BNP (last 3 results) No results for input(s): BNP in the last 8760 hours. CBG: Recent Labs  Lab 01/28/18 1201 01/28/18 1716 01/28/18 2119 01/29/18 0727 01/29/18 1143  GLUCAP 188* 181* 169* 91 117*    Time spent: 35 minutes  Signed:  Berle Mull  [facility]  01/29/2018  , 12:12 PM

## 2018-01-31 DIAGNOSIS — I4891 Unspecified atrial fibrillation: Secondary | ICD-10-CM | POA: Diagnosis not present

## 2018-01-31 DIAGNOSIS — N183 Chronic kidney disease, stage 3 (moderate): Secondary | ICD-10-CM | POA: Diagnosis not present

## 2018-01-31 DIAGNOSIS — S2242XS Multiple fractures of ribs, left side, sequela: Secondary | ICD-10-CM | POA: Diagnosis not present

## 2018-01-31 DIAGNOSIS — K210 Gastro-esophageal reflux disease with esophagitis: Secondary | ICD-10-CM | POA: Diagnosis not present

## 2018-01-31 DIAGNOSIS — E1149 Type 2 diabetes mellitus with other diabetic neurological complication: Secondary | ICD-10-CM | POA: Diagnosis not present

## 2018-01-31 DIAGNOSIS — J8410 Pulmonary fibrosis, unspecified: Secondary | ICD-10-CM | POA: Diagnosis not present

## 2018-01-31 DIAGNOSIS — R296 Repeated falls: Secondary | ICD-10-CM | POA: Diagnosis not present

## 2018-01-31 DIAGNOSIS — Z8546 Personal history of malignant neoplasm of prostate: Secondary | ICD-10-CM | POA: Diagnosis not present

## 2018-01-31 DIAGNOSIS — J9600 Acute respiratory failure, unspecified whether with hypoxia or hypercapnia: Secondary | ICD-10-CM | POA: Diagnosis not present

## 2018-01-31 DIAGNOSIS — Z86711 Personal history of pulmonary embolism: Secondary | ICD-10-CM | POA: Diagnosis not present

## 2018-01-31 DIAGNOSIS — B370 Candidal stomatitis: Secondary | ICD-10-CM | POA: Diagnosis not present

## 2018-01-31 DIAGNOSIS — M6281 Muscle weakness (generalized): Secondary | ICD-10-CM | POA: Diagnosis not present

## 2018-02-01 DIAGNOSIS — K210 Gastro-esophageal reflux disease with esophagitis: Secondary | ICD-10-CM | POA: Diagnosis not present

## 2018-02-01 DIAGNOSIS — N178 Other acute kidney failure: Secondary | ICD-10-CM | POA: Diagnosis not present

## 2018-02-01 DIAGNOSIS — S2241XA Multiple fractures of ribs, right side, initial encounter for closed fracture: Secondary | ICD-10-CM | POA: Diagnosis not present

## 2018-02-01 DIAGNOSIS — N183 Chronic kidney disease, stage 3 (moderate): Secondary | ICD-10-CM | POA: Diagnosis not present

## 2018-02-01 DIAGNOSIS — J8410 Pulmonary fibrosis, unspecified: Secondary | ICD-10-CM | POA: Diagnosis not present

## 2018-02-01 DIAGNOSIS — Z86711 Personal history of pulmonary embolism: Secondary | ICD-10-CM | POA: Diagnosis not present

## 2018-02-01 DIAGNOSIS — E1149 Type 2 diabetes mellitus with other diabetic neurological complication: Secondary | ICD-10-CM | POA: Diagnosis not present

## 2018-02-01 DIAGNOSIS — M6281 Muscle weakness (generalized): Secondary | ICD-10-CM | POA: Diagnosis not present

## 2018-02-01 DIAGNOSIS — I4891 Unspecified atrial fibrillation: Secondary | ICD-10-CM | POA: Diagnosis not present

## 2018-02-01 DIAGNOSIS — J9621 Acute and chronic respiratory failure with hypoxia: Secondary | ICD-10-CM | POA: Diagnosis not present

## 2018-02-01 DIAGNOSIS — R296 Repeated falls: Secondary | ICD-10-CM | POA: Diagnosis not present

## 2018-02-01 DIAGNOSIS — R627 Adult failure to thrive: Secondary | ICD-10-CM | POA: Diagnosis not present

## 2018-02-03 ENCOUNTER — Telehealth: Payer: Self-pay | Admitting: Internal Medicine

## 2018-02-03 DIAGNOSIS — J9610 Chronic respiratory failure, unspecified whether with hypoxia or hypercapnia: Secondary | ICD-10-CM | POA: Diagnosis not present

## 2018-02-03 DIAGNOSIS — S2241XA Multiple fractures of ribs, right side, initial encounter for closed fracture: Secondary | ICD-10-CM | POA: Diagnosis not present

## 2018-02-03 DIAGNOSIS — J8410 Pulmonary fibrosis, unspecified: Secondary | ICD-10-CM | POA: Diagnosis not present

## 2018-02-03 NOTE — Telephone Encounter (Signed)
Called and spoke with pt's wife [name] who states pt is not any better since hosp stay.  [name] cancelled pt's appt that was scheduled 12/30 with Derl Barrow due to how pt is. Per [name], pt is unable to do any activities he is supposed to be doing at rehab. Pt is having problems with very little activities such as getting out of bed to walk a couple steps, getting dressed, taking a shower, etc.  Pt is also hurting a lot due to having two fractured ribs and is on pain management for it. [name] states she is beginning to wonder if hospice care should be done for pt based on how pt is.  [name] stated pt is mainly staying in bed not doing anything and is also hardly eating any food. Pt has lost from 185 pounds down to 162 pounds within the last 16 days. Pt was released from the hosp 12/21 and went straight to rehab from there.   Sending to APP of day. Beth, please advise recs for both pt and wife [name]. Thanks!

## 2018-02-03 NOTE — Telephone Encounter (Signed)
I would have patients wife discuss concerns with rehab covering physician and his nurses. Not much I can do if he is still in rehab and cancelled apt. Recommend at least getting out of bed to chair 1-3 times a day for meals with assistance. Participate in physical therapy as much as he can. Ensure/boost shakes are an option for nutrition and calories.

## 2018-02-03 NOTE — Telephone Encounter (Signed)
Spoke with daughter she verbalized understanding at this time.

## 2018-02-07 ENCOUNTER — Telehealth: Payer: Self-pay | Admitting: Internal Medicine

## 2018-02-07 ENCOUNTER — Inpatient Hospital Stay: Payer: Medicare Other | Admitting: Primary Care

## 2018-02-07 ENCOUNTER — Other Ambulatory Visit: Payer: Self-pay | Admitting: Family Medicine

## 2018-02-07 DIAGNOSIS — I4891 Unspecified atrial fibrillation: Secondary | ICD-10-CM | POA: Diagnosis not present

## 2018-02-07 DIAGNOSIS — N178 Other acute kidney failure: Secondary | ICD-10-CM | POA: Diagnosis not present

## 2018-02-07 DIAGNOSIS — E871 Hypo-osmolality and hyponatremia: Secondary | ICD-10-CM | POA: Diagnosis not present

## 2018-02-07 DIAGNOSIS — Z86711 Personal history of pulmonary embolism: Secondary | ICD-10-CM | POA: Diagnosis not present

## 2018-02-07 DIAGNOSIS — K210 Gastro-esophageal reflux disease with esophagitis: Secondary | ICD-10-CM | POA: Diagnosis not present

## 2018-02-07 DIAGNOSIS — R296 Repeated falls: Secondary | ICD-10-CM | POA: Diagnosis not present

## 2018-02-07 DIAGNOSIS — M6281 Muscle weakness (generalized): Secondary | ICD-10-CM | POA: Diagnosis not present

## 2018-02-07 DIAGNOSIS — J8410 Pulmonary fibrosis, unspecified: Secondary | ICD-10-CM

## 2018-02-07 DIAGNOSIS — S2241XA Multiple fractures of ribs, right side, initial encounter for closed fracture: Secondary | ICD-10-CM | POA: Diagnosis not present

## 2018-02-07 DIAGNOSIS — R627 Adult failure to thrive: Secondary | ICD-10-CM | POA: Diagnosis not present

## 2018-02-07 DIAGNOSIS — J9621 Acute and chronic respiratory failure with hypoxia: Secondary | ICD-10-CM | POA: Diagnosis not present

## 2018-02-07 DIAGNOSIS — E1149 Type 2 diabetes mellitus with other diabetic neurological complication: Secondary | ICD-10-CM | POA: Diagnosis not present

## 2018-02-07 NOTE — Telephone Encounter (Signed)
Called patient, unable to reach left message to give us a call back. 

## 2018-02-07 NOTE — Telephone Encounter (Signed)
Called and spoke with pt's wife [name].  Per [name], she is wanting to release patient to hospice care. Pt has been in rehab but has not been strong enough or able to do anything at rehab. Based on pt's declining condition, is why she is wanting pt to be released to hospice care. Pt is too weak to get out of bed or do any activities.  Pt has an appt scheduled with Dr. Chase Caller 03/15/18 at 2:30 but [name] stated she is unsure if pt will be able to make it to that appt as she is unable to get him into a wheelchair due to how he is declining.  Dr. Lamonte Sakai, since pt saw you 01/10/18 and has not seen Dr. Chase Caller yet for the appt, please advise if you are okay with [place] referring pt to hospice care? Also, due to pt having bed sores from not being able to get out of bed and unable to lay on his side due to having the fractured ribs, [name] is wanting to know if something else can be prescribed to help treat the sores? [name] stated that the people at rehab have been using aquaphor and placing patches on his sores but this is not helping and she is wanting to know if medihoney patches can be prescribed to see if this would help treat the places better?

## 2018-02-07 NOTE — Telephone Encounter (Signed)
I am Ok with making a hospice referral. It may be that his PCP would be the most appropriate MD to coordinate care, be the official hospice attending MD. Hospice will need to figure out who will be the official attending.   If the patient feels that there is not much benefit to going to [facility] appointment then tell them they can cancel it.   It sounds like we need a HH (or hospice) wound care referral if this has not already been done. I'm not familiar with the medihoney patches.

## 2018-02-07 NOTE — Telephone Encounter (Signed)
Last office visit 01/05/2018 with Dr. Einar Pheasant for COPD exacerbation.  Last refilled 12/21/2017 for #30 with no refills.  Next Appt:  10/28/2018 for CPE.

## 2018-02-08 NOTE — Telephone Encounter (Signed)
Correction.. "ling" = lung

## 2018-02-08 NOTE — Telephone Encounter (Signed)
Called and spoke with pt's wife [name] letting her know that RB was fine with the hospice referral and also stated that we might need to do a referral to [facility] or hospice for wound care. [name] expressed understanding.  Also stated to [name] that we could cancel pt's appt that is currently scheduled with [facility] if they felt like pt would not be able to make it or if they felt like it would not be much of a benefit. [name] stated she wanted to think about it and stated if she wanted to cancel pt's appt, she would call us back to let us know.  Order has been placed for pt to be referred to hospice.nothing further needed.

## 2018-02-08 NOTE — Telephone Encounter (Signed)
Spoke with [name].  Patient is currently in [facility] and is being discharged to Hospice on Friday.  [name] would like a refill to have at home so Mr. Mccarley is able to get some rest.

## 2018-02-08 NOTE — Telephone Encounter (Signed)
Pt currently in rehab.  Call wife to determine if they recommended continuing this med or stop given ling status.

## 2018-02-11 ENCOUNTER — Ambulatory Visit: Payer: Medicare Other | Admitting: Neurology

## 2018-02-11 ENCOUNTER — Ambulatory Visit: Payer: Medicare Other | Admitting: Radiation Oncology

## 2018-02-11 DIAGNOSIS — S2241XA Multiple fractures of ribs, right side, initial encounter for closed fracture: Secondary | ICD-10-CM | POA: Diagnosis not present

## 2018-02-11 DIAGNOSIS — K210 Gastro-esophageal reflux disease with esophagitis: Secondary | ICD-10-CM | POA: Diagnosis not present

## 2018-02-11 DIAGNOSIS — Z86711 Personal history of pulmonary embolism: Secondary | ICD-10-CM | POA: Diagnosis not present

## 2018-02-11 DIAGNOSIS — J9621 Acute and chronic respiratory failure with hypoxia: Secondary | ICD-10-CM | POA: Diagnosis not present

## 2018-02-11 DIAGNOSIS — R627 Adult failure to thrive: Secondary | ICD-10-CM | POA: Diagnosis not present

## 2018-02-11 DIAGNOSIS — R296 Repeated falls: Secondary | ICD-10-CM | POA: Diagnosis not present

## 2018-02-11 DIAGNOSIS — I4891 Unspecified atrial fibrillation: Secondary | ICD-10-CM | POA: Diagnosis not present

## 2018-02-11 DIAGNOSIS — E1149 Type 2 diabetes mellitus with other diabetic neurological complication: Secondary | ICD-10-CM | POA: Diagnosis not present

## 2018-02-11 DIAGNOSIS — M6281 Muscle weakness (generalized): Secondary | ICD-10-CM | POA: Diagnosis not present

## 2018-02-11 DIAGNOSIS — N178 Other acute kidney failure: Secondary | ICD-10-CM | POA: Diagnosis not present

## 2018-02-11 DIAGNOSIS — N183 Chronic kidney disease, stage 3 (moderate): Secondary | ICD-10-CM | POA: Diagnosis not present

## 2018-02-11 DIAGNOSIS — J8410 Pulmonary fibrosis, unspecified: Secondary | ICD-10-CM | POA: Diagnosis not present

## 2018-02-13 ENCOUNTER — Other Ambulatory Visit: Payer: Self-pay | Admitting: Primary Care

## 2018-02-13 DIAGNOSIS — J441 Chronic obstructive pulmonary disease with (acute) exacerbation: Secondary | ICD-10-CM

## 2018-02-24 ENCOUNTER — Ambulatory Visit: Payer: Medicare Other | Admitting: Internal Medicine

## 2018-03-12 DEATH — deceased

## 2018-03-15 ENCOUNTER — Ambulatory Visit: Payer: Medicare Other | Admitting: Internal Medicine

## 2018-10-26 ENCOUNTER — Ambulatory Visit: Payer: Medicare Other

## 2018-10-28 ENCOUNTER — Encounter: Payer: Medicare Other | Admitting: Family Medicine

## 2019-05-05 IMAGING — DX DG CHEST 2V
2 series · 2 of 2 positions shown · non-contrast
Comparison: 01/06/2017

CLINICAL DATA: Shortness of breath for 3 weeks.

EXAM:
CHEST - 2 VIEW

[chest pa]
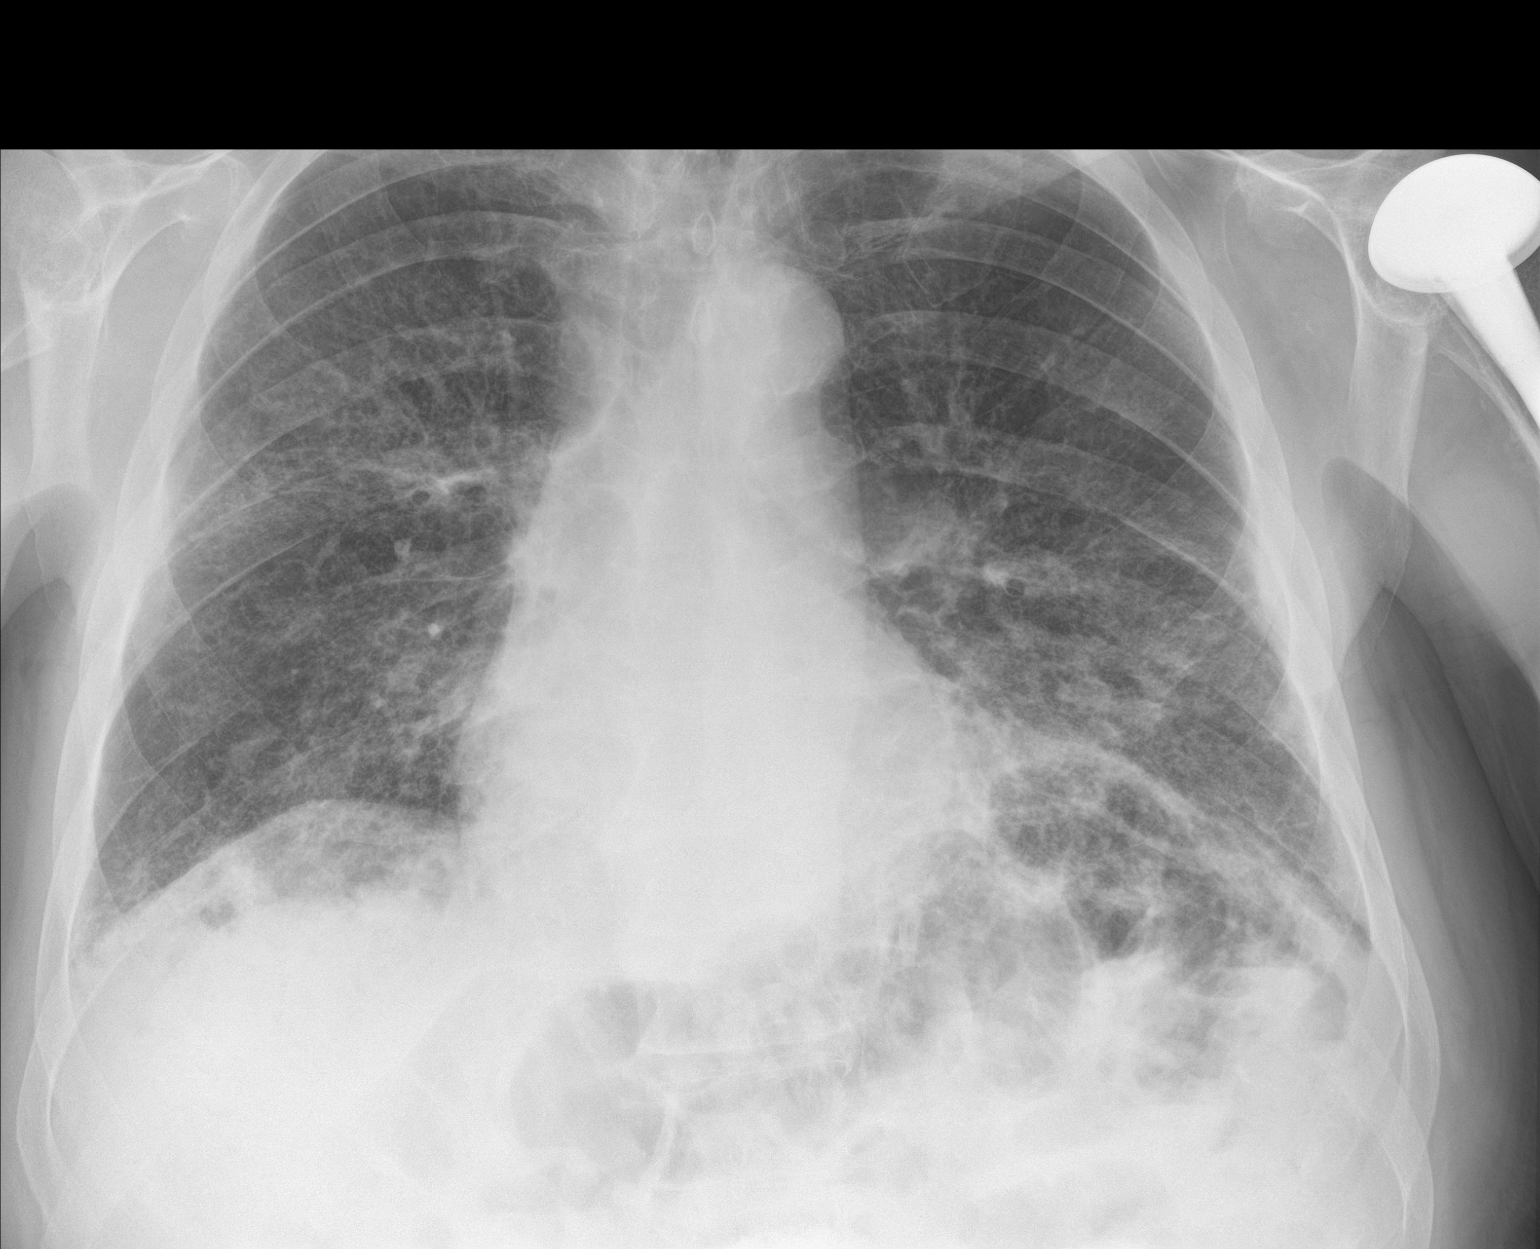

[chest lat]
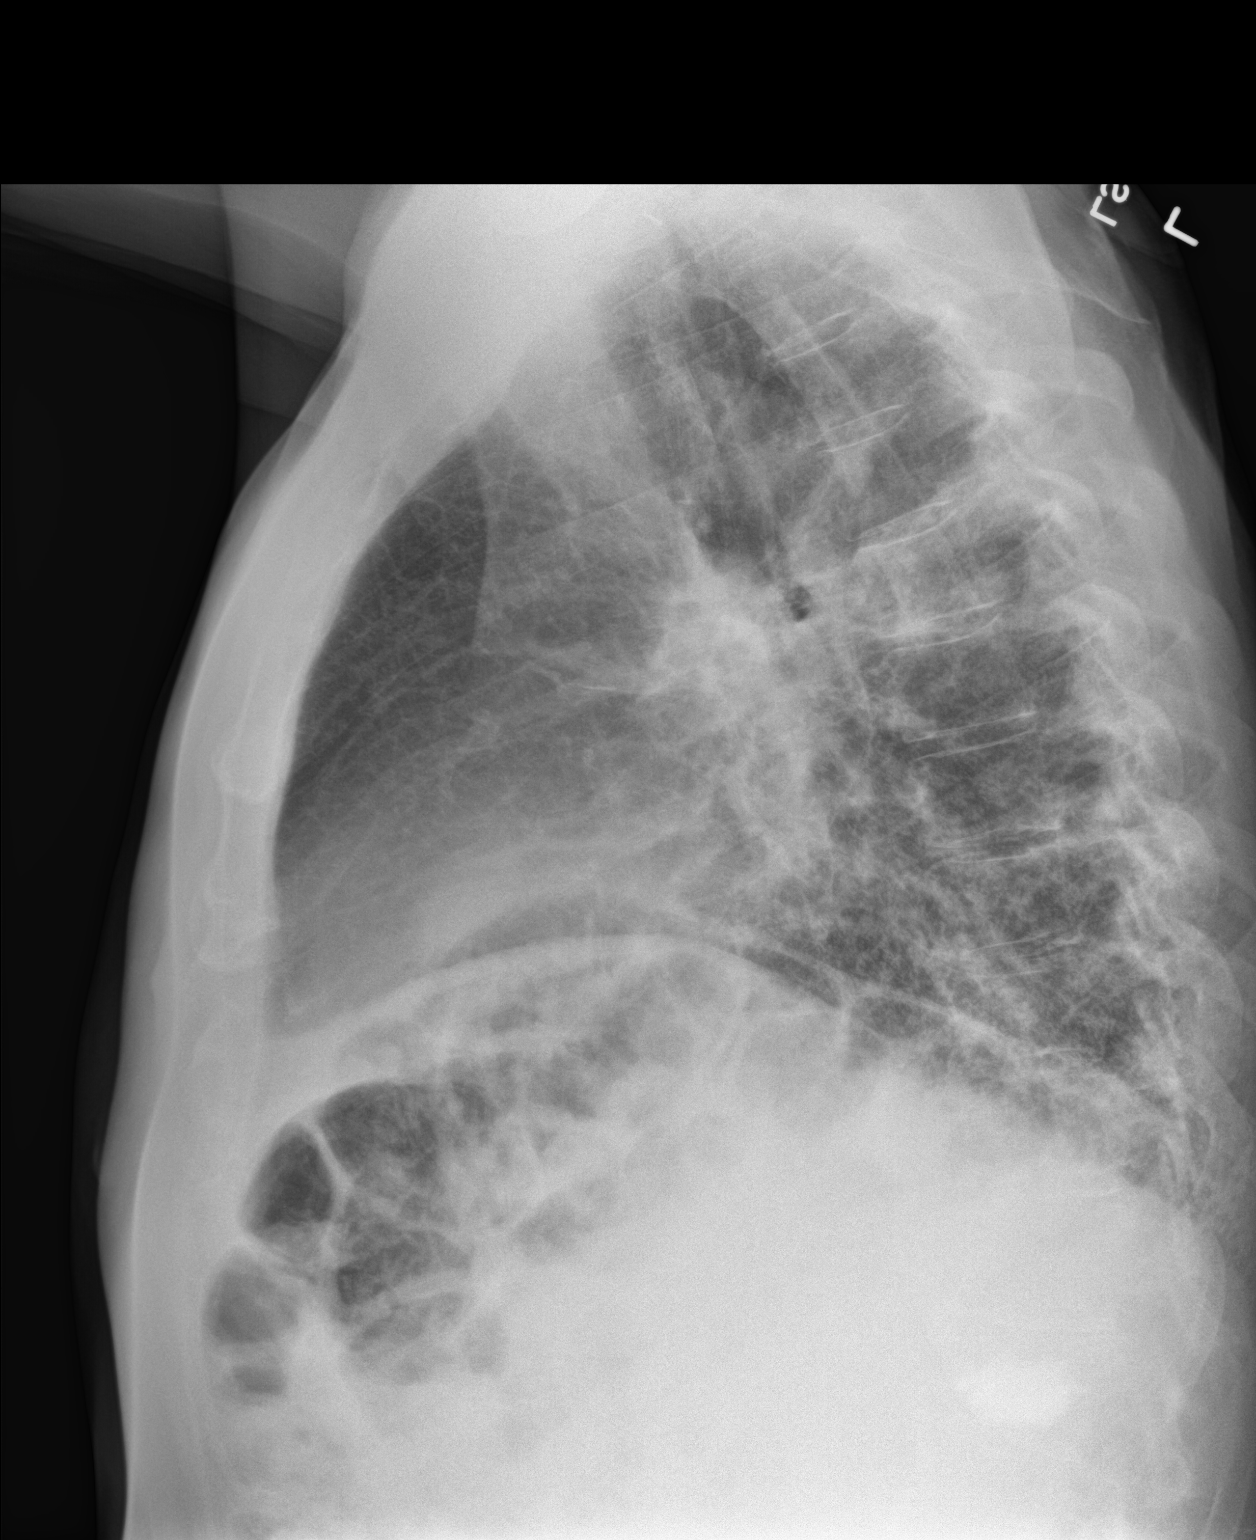

[2 of 2 positions shown; findings below may reference images not displayed]

FINDINGS: There is hazy airspace disease in the right and left lower lobes
concerning for pneumonia. There is no pleural effusion or
pneumothorax. The heart and mediastinal contours are unremarkable.

The osseous structures are unremarkable.
IMPRESSION: Hazy airspace disease in the right and left lower lobes concerning
for pneumonia.
